# Patient Record
Sex: Female | Born: 1970
Health system: Southern US, Community
[De-identification: ages and names within clinical notes are randomized; demographics above are authoritative.]

## PROBLEM LIST (undated history)

## (undated) DIAGNOSIS — K861 Other chronic pancreatitis: Secondary | ICD-10-CM

## (undated) DIAGNOSIS — F32A Depression, unspecified: Secondary | ICD-10-CM

## (undated) DIAGNOSIS — R109 Unspecified abdominal pain: Secondary | ICD-10-CM

## (undated) DIAGNOSIS — F319 Bipolar disorder, unspecified: Secondary | ICD-10-CM

## (undated) DIAGNOSIS — K449 Diaphragmatic hernia without obstruction or gangrene: Secondary | ICD-10-CM

## (undated) DIAGNOSIS — K209 Esophagitis, unspecified without bleeding: Secondary | ICD-10-CM

## (undated) DIAGNOSIS — J449 Chronic obstructive pulmonary disease, unspecified: Secondary | ICD-10-CM

## (undated) DIAGNOSIS — M542 Cervicalgia: Secondary | ICD-10-CM

## (undated) DIAGNOSIS — E1140 Type 2 diabetes mellitus with diabetic neuropathy, unspecified: Secondary | ICD-10-CM

## (undated) DIAGNOSIS — G8929 Other chronic pain: Secondary | ICD-10-CM

## (undated) DIAGNOSIS — E785 Hyperlipidemia, unspecified: Secondary | ICD-10-CM

## (undated) DIAGNOSIS — D649 Anemia, unspecified: Secondary | ICD-10-CM

## (undated) DIAGNOSIS — F329 Major depressive disorder, single episode, unspecified: Secondary | ICD-10-CM

## (undated) DIAGNOSIS — F419 Anxiety disorder, unspecified: Secondary | ICD-10-CM

## (undated) HISTORY — PX: ABDOMINAL HYSTERECTOMY: SHX81

## (undated) HISTORY — DX: Type 2 diabetes mellitus with diabetic neuropathy, unspecified: E11.40

## (undated) HISTORY — PX: TONSILLECTOMY: SUR1361

## (undated) HISTORY — PX: CELIAC PLEXUS BLOCK: SHX1320

## (undated) HISTORY — PX: CHOLECYSTECTOMY: SHX55

---

## 2004-09-01 ENCOUNTER — Ambulatory Visit (HOSPITAL_COMMUNITY): Admission: RE | Admit: 2004-09-01 | Discharge: 2004-09-01 | Payer: Self-pay | Admitting: Obstetrics and Gynecology

## 2004-09-01 ENCOUNTER — Encounter (INDEPENDENT_AMBULATORY_CARE_PROVIDER_SITE_OTHER): Payer: Self-pay | Admitting: *Deleted

## 2004-09-02 ENCOUNTER — Encounter (INDEPENDENT_AMBULATORY_CARE_PROVIDER_SITE_OTHER): Payer: Self-pay | Admitting: *Deleted

## 2004-12-20 ENCOUNTER — Emergency Department (HOSPITAL_COMMUNITY): Admission: EM | Admit: 2004-12-20 | Discharge: 2004-12-20 | Payer: Self-pay | Admitting: Emergency Medicine

## 2005-02-23 ENCOUNTER — Emergency Department (HOSPITAL_COMMUNITY): Admission: EM | Admit: 2005-02-23 | Discharge: 2005-02-23 | Payer: Self-pay | Admitting: *Deleted

## 2006-08-09 ENCOUNTER — Emergency Department (HOSPITAL_COMMUNITY): Admission: EM | Admit: 2006-08-09 | Discharge: 2006-08-09 | Payer: Self-pay | Admitting: Emergency Medicine

## 2006-08-11 ENCOUNTER — Emergency Department (HOSPITAL_COMMUNITY): Admission: EM | Admit: 2006-08-11 | Discharge: 2006-08-11 | Payer: Self-pay | Admitting: Emergency Medicine

## 2006-10-05 ENCOUNTER — Emergency Department (HOSPITAL_COMMUNITY): Admission: EM | Admit: 2006-10-05 | Discharge: 2006-10-05 | Payer: Self-pay | Admitting: Emergency Medicine

## 2006-10-18 ENCOUNTER — Ambulatory Visit (HOSPITAL_COMMUNITY): Admission: RE | Admit: 2006-10-18 | Discharge: 2006-10-18 | Payer: Self-pay | Admitting: Family Medicine

## 2007-04-19 ENCOUNTER — Emergency Department (HOSPITAL_COMMUNITY): Admission: EM | Admit: 2007-04-19 | Discharge: 2007-04-19 | Payer: Self-pay | Admitting: Emergency Medicine

## 2007-06-26 ENCOUNTER — Emergency Department (HOSPITAL_COMMUNITY): Admission: EM | Admit: 2007-06-26 | Discharge: 2007-06-26 | Payer: Self-pay | Admitting: Emergency Medicine

## 2008-02-16 ENCOUNTER — Ambulatory Visit (HOSPITAL_COMMUNITY): Admission: RE | Admit: 2008-02-16 | Discharge: 2008-02-16 | Payer: Self-pay | Admitting: Internal Medicine

## 2008-02-17 ENCOUNTER — Inpatient Hospital Stay (HOSPITAL_COMMUNITY): Admission: AD | Admit: 2008-02-17 | Discharge: 2008-02-21 | Payer: Self-pay

## 2008-02-18 ENCOUNTER — Ambulatory Visit: Payer: Self-pay | Admitting: Internal Medicine

## 2008-02-20 ENCOUNTER — Ambulatory Visit: Payer: Self-pay | Admitting: Internal Medicine

## 2008-02-20 ENCOUNTER — Encounter: Payer: Self-pay | Admitting: Internal Medicine

## 2008-02-20 HISTORY — PX: ESOPHAGOGASTRODUODENOSCOPY: SHX1529

## 2008-02-21 ENCOUNTER — Ambulatory Visit: Payer: Self-pay | Admitting: Gastroenterology

## 2008-03-12 ENCOUNTER — Ambulatory Visit: Payer: Self-pay | Admitting: Internal Medicine

## 2008-03-12 ENCOUNTER — Encounter: Payer: Self-pay | Admitting: Internal Medicine

## 2008-03-12 ENCOUNTER — Ambulatory Visit (HOSPITAL_COMMUNITY): Admission: RE | Admit: 2008-03-12 | Discharge: 2008-03-12 | Payer: Self-pay | Admitting: Internal Medicine

## 2008-03-29 ENCOUNTER — Ambulatory Visit: Payer: Self-pay | Admitting: Internal Medicine

## 2008-03-30 ENCOUNTER — Ambulatory Visit (HOSPITAL_COMMUNITY): Admission: RE | Admit: 2008-03-30 | Discharge: 2008-03-30 | Payer: Self-pay | Admitting: Gastroenterology

## 2008-04-03 ENCOUNTER — Ambulatory Visit (HOSPITAL_COMMUNITY): Admission: RE | Admit: 2008-04-03 | Discharge: 2008-04-03 | Payer: Self-pay | Admitting: Internal Medicine

## 2008-04-03 ENCOUNTER — Encounter: Payer: Self-pay | Admitting: Internal Medicine

## 2008-04-03 ENCOUNTER — Ambulatory Visit: Payer: Self-pay | Admitting: Internal Medicine

## 2008-04-03 HISTORY — PX: ESOPHAGOGASTRODUODENOSCOPY: SHX1529

## 2008-05-08 ENCOUNTER — Emergency Department (HOSPITAL_COMMUNITY): Admission: EM | Admit: 2008-05-08 | Discharge: 2008-05-08 | Payer: Self-pay | Admitting: Emergency Medicine

## 2008-05-14 ENCOUNTER — Ambulatory Visit: Payer: Self-pay | Admitting: Internal Medicine

## 2008-06-04 ENCOUNTER — Ambulatory Visit: Payer: Self-pay | Admitting: Internal Medicine

## 2008-06-04 ENCOUNTER — Emergency Department (HOSPITAL_COMMUNITY): Admission: EM | Admit: 2008-06-04 | Discharge: 2008-06-04 | Payer: Self-pay | Admitting: Emergency Medicine

## 2008-06-04 ENCOUNTER — Ambulatory Visit (HOSPITAL_COMMUNITY): Admission: RE | Admit: 2008-06-04 | Discharge: 2008-06-04 | Payer: Self-pay | Admitting: Internal Medicine

## 2008-06-05 HISTORY — PX: OTHER SURGICAL HISTORY: SHX169

## 2008-07-13 ENCOUNTER — Inpatient Hospital Stay (HOSPITAL_COMMUNITY): Admission: EM | Admit: 2008-07-13 | Discharge: 2008-07-18 | Payer: Self-pay | Admitting: Emergency Medicine

## 2008-08-15 ENCOUNTER — Inpatient Hospital Stay (HOSPITAL_COMMUNITY): Admission: EM | Admit: 2008-08-15 | Discharge: 2008-08-16 | Payer: Self-pay | Admitting: Emergency Medicine

## 2008-09-04 ENCOUNTER — Inpatient Hospital Stay (HOSPITAL_COMMUNITY): Admission: EM | Admit: 2008-09-04 | Discharge: 2008-09-07 | Payer: Self-pay | Admitting: Emergency Medicine

## 2008-11-28 ENCOUNTER — Encounter (INDEPENDENT_AMBULATORY_CARE_PROVIDER_SITE_OTHER): Payer: Self-pay | Admitting: *Deleted

## 2008-12-20 ENCOUNTER — Ambulatory Visit (HOSPITAL_COMMUNITY): Admission: RE | Admit: 2008-12-20 | Discharge: 2008-12-20 | Payer: Self-pay | Admitting: Internal Medicine

## 2009-01-04 DIAGNOSIS — K219 Gastro-esophageal reflux disease without esophagitis: Secondary | ICD-10-CM

## 2009-01-04 DIAGNOSIS — D509 Iron deficiency anemia, unspecified: Secondary | ICD-10-CM

## 2009-01-04 DIAGNOSIS — F411 Generalized anxiety disorder: Secondary | ICD-10-CM | POA: Insufficient documentation

## 2009-01-04 DIAGNOSIS — R197 Diarrhea, unspecified: Secondary | ICD-10-CM | POA: Insufficient documentation

## 2009-01-04 DIAGNOSIS — K222 Esophageal obstruction: Secondary | ICD-10-CM

## 2009-01-04 DIAGNOSIS — G43909 Migraine, unspecified, not intractable, without status migrainosus: Secondary | ICD-10-CM | POA: Insufficient documentation

## 2009-01-04 DIAGNOSIS — K921 Melena: Secondary | ICD-10-CM | POA: Insufficient documentation

## 2009-01-04 DIAGNOSIS — K449 Diaphragmatic hernia without obstruction or gangrene: Secondary | ICD-10-CM | POA: Insufficient documentation

## 2009-01-04 DIAGNOSIS — F319 Bipolar disorder, unspecified: Secondary | ICD-10-CM

## 2009-01-04 DIAGNOSIS — R112 Nausea with vomiting, unspecified: Secondary | ICD-10-CM

## 2009-01-04 DIAGNOSIS — R1084 Generalized abdominal pain: Secondary | ICD-10-CM | POA: Insufficient documentation

## 2009-01-04 DIAGNOSIS — I10 Essential (primary) hypertension: Secondary | ICD-10-CM

## 2009-01-04 DIAGNOSIS — K859 Acute pancreatitis without necrosis or infection, unspecified: Secondary | ICD-10-CM

## 2009-01-04 DIAGNOSIS — R109 Unspecified abdominal pain: Secondary | ICD-10-CM

## 2009-01-04 DIAGNOSIS — J45909 Unspecified asthma, uncomplicated: Secondary | ICD-10-CM | POA: Insufficient documentation

## 2009-01-08 DIAGNOSIS — S52133A Displaced fracture of neck of unspecified radius, initial encounter for closed fracture: Secondary | ICD-10-CM | POA: Insufficient documentation

## 2009-03-14 ENCOUNTER — Emergency Department (HOSPITAL_COMMUNITY): Admission: EM | Admit: 2009-03-14 | Discharge: 2009-03-14 | Payer: Self-pay | Admitting: Emergency Medicine

## 2009-04-25 ENCOUNTER — Telehealth (INDEPENDENT_AMBULATORY_CARE_PROVIDER_SITE_OTHER): Payer: Self-pay

## 2009-06-09 ENCOUNTER — Inpatient Hospital Stay (HOSPITAL_COMMUNITY): Admission: EM | Admit: 2009-06-09 | Discharge: 2009-06-14 | Payer: Self-pay | Admitting: Emergency Medicine

## 2009-08-11 DIAGNOSIS — J4540 Moderate persistent asthma, uncomplicated: Secondary | ICD-10-CM | POA: Insufficient documentation

## 2009-10-20 ENCOUNTER — Inpatient Hospital Stay (HOSPITAL_COMMUNITY): Admission: EM | Admit: 2009-10-20 | Discharge: 2009-10-26 | Payer: Self-pay | Admitting: Emergency Medicine

## 2009-11-23 ENCOUNTER — Emergency Department (HOSPITAL_COMMUNITY): Admission: EM | Admit: 2009-11-23 | Discharge: 2009-11-23 | Payer: Self-pay | Admitting: Emergency Medicine

## 2009-11-23 ENCOUNTER — Emergency Department (HOSPITAL_COMMUNITY): Admission: EM | Admit: 2009-11-23 | Discharge: 2009-11-24 | Payer: Self-pay | Admitting: Emergency Medicine

## 2009-12-10 ENCOUNTER — Emergency Department (HOSPITAL_COMMUNITY): Admission: EM | Admit: 2009-12-10 | Discharge: 2009-12-10 | Payer: Self-pay | Admitting: Emergency Medicine

## 2009-12-25 ENCOUNTER — Emergency Department (HOSPITAL_COMMUNITY): Admission: EM | Admit: 2009-12-25 | Discharge: 2009-12-26 | Payer: Self-pay | Admitting: Emergency Medicine

## 2009-12-26 ENCOUNTER — Inpatient Hospital Stay (HOSPITAL_COMMUNITY): Admission: AD | Admit: 2009-12-26 | Discharge: 2009-12-30 | Payer: Self-pay | Admitting: Psychiatry

## 2009-12-26 ENCOUNTER — Ambulatory Visit: Payer: Self-pay | Admitting: Psychiatry

## 2010-01-15 ENCOUNTER — Inpatient Hospital Stay (HOSPITAL_COMMUNITY): Admission: EM | Admit: 2010-01-15 | Discharge: 2010-01-22 | Payer: Self-pay | Admitting: Emergency Medicine

## 2010-01-26 ENCOUNTER — Emergency Department (HOSPITAL_COMMUNITY): Admission: EM | Admit: 2010-01-26 | Discharge: 2010-01-26 | Payer: Self-pay | Admitting: Emergency Medicine

## 2010-02-05 ENCOUNTER — Inpatient Hospital Stay (HOSPITAL_COMMUNITY): Admission: EM | Admit: 2010-02-05 | Discharge: 2010-02-10 | Payer: Self-pay | Admitting: Emergency Medicine

## 2010-02-16 DIAGNOSIS — R300 Dysuria: Secondary | ICD-10-CM | POA: Insufficient documentation

## 2010-02-19 DIAGNOSIS — A493 Mycoplasma infection, unspecified site: Secondary | ICD-10-CM | POA: Insufficient documentation

## 2010-03-22 ENCOUNTER — Emergency Department (HOSPITAL_COMMUNITY): Admission: EM | Admit: 2010-03-22 | Discharge: 2010-03-22 | Payer: Self-pay | Admitting: Emergency Medicine

## 2010-04-04 ENCOUNTER — Emergency Department (HOSPITAL_COMMUNITY): Admission: EM | Admit: 2010-04-04 | Discharge: 2010-04-04 | Payer: Self-pay | Admitting: Emergency Medicine

## 2010-04-06 ENCOUNTER — Emergency Department (HOSPITAL_COMMUNITY): Admission: EM | Admit: 2010-04-06 | Discharge: 2010-04-06 | Payer: Self-pay | Admitting: Emergency Medicine

## 2010-04-17 ENCOUNTER — Ambulatory Visit: Payer: Self-pay | Admitting: Psychiatry

## 2010-04-17 ENCOUNTER — Emergency Department (HOSPITAL_COMMUNITY): Admission: EM | Admit: 2010-04-17 | Discharge: 2010-04-17 | Payer: Self-pay | Admitting: Emergency Medicine

## 2010-04-18 ENCOUNTER — Inpatient Hospital Stay (HOSPITAL_COMMUNITY): Admission: AD | Admit: 2010-04-18 | Discharge: 2010-04-21 | Payer: Self-pay | Admitting: Psychiatry

## 2010-05-08 ENCOUNTER — Inpatient Hospital Stay (HOSPITAL_COMMUNITY): Admission: EM | Admit: 2010-05-08 | Discharge: 2010-05-11 | Payer: Self-pay | Admitting: Emergency Medicine

## 2010-06-17 ENCOUNTER — Emergency Department (HOSPITAL_COMMUNITY): Admission: EM | Admit: 2010-06-17 | Discharge: 2010-06-18 | Payer: Self-pay | Admitting: Emergency Medicine

## 2010-10-07 LAB — COMPREHENSIVE METABOLIC PANEL
ALT: 46 U/L — ABNORMAL HIGH (ref 0–35)
AST: 26 U/L (ref 0–37)
BUN: 13 mg/dL (ref 6–23)
CO2: 28 mEq/L (ref 19–32)
Calcium: 9.2 mg/dL (ref 8.4–10.5)
Chloride: 101 mEq/L (ref 96–112)
Creatinine, Ser: 0.81 mg/dL (ref 0.4–1.2)
GFR calc Af Amer: >60 mL/min (ref >60)
GFR calc non Af Amer: >60 mL/min (ref >60)
Glucose, Bld: 141 mg/dL — ABNORMAL HIGH (ref 70–99)
Potassium: 3.8 mEq/L (ref 3.5–5.1)
Sodium: 139 mEq/L (ref 135–145)
Total Bilirubin: 0.5 mg/dL (ref 0.3–1.2)
Total Protein: 6.3 g/dL (ref 6.0–8.3)

## 2010-10-07 LAB — DIFFERENTIAL
Basophils Absolute: 0 10*3/uL (ref 0.0–0.1)
Basophils Relative: 0 % (ref 0–1)
Eosinophils Relative: 2 % (ref 0–5)
Lymphocytes Relative: 24 % (ref 12–46)
Lymphs Abs: 5 10*3/uL — ABNORMAL HIGH (ref 0.7–4.0)
Monocytes Relative: 5 % (ref 3–12)
Neutro Abs: 14.1 10*3/uL — ABNORMAL HIGH (ref 1.7–7.7)
Neutrophils Relative %: 68 % (ref 43–77)

## 2010-10-07 LAB — URINALYSIS, ROUTINE W REFLEX MICROSCOPIC
Bilirubin Urine: NEGATIVE
Glucose, UA: 100 mg/dL — AB
Ketones, ur: NEGATIVE mg/dL
Nitrite: NEGATIVE
Protein, ur: NEGATIVE mg/dL

## 2010-10-07 LAB — CBC
HCT: 38.4 % (ref 36.0–46.0)
MCH: 31.9 pg (ref 26.0–34.0)
MCHC: 35.2 g/dL (ref 30.0–36.0)
RBC: 4.23 MIL/uL (ref 3.87–5.11)
RDW: 13 % (ref 11.5–15.5)

## 2010-10-07 LAB — LIPASE, BLOOD: Lipase: 49 U/L (ref 11–59)

## 2010-10-08 LAB — CBC
HCT: 37.8 % (ref 36.0–46.0)
Hemoglobin: 13 g/dL (ref 12.0–15.0)
Hemoglobin: 13.1 g/dL (ref 12.0–15.0)
MCH: 31.4 pg (ref 26.0–34.0)
MCH: 31.5 pg (ref 26.0–34.0)
MCHC: 34.5 g/dL (ref 30.0–36.0)
MCV: 91.3 fL (ref 78.0–100.0)
MCV: 91.4 fL (ref 78.0–100.0)
Platelets: 198 10*3/uL (ref 150–400)
Platelets: 199 10*3/uL (ref 150–400)
RBC: 4.14 MIL/uL (ref 3.87–5.11)
RBC: 4.18 MIL/uL (ref 3.87–5.11)
RDW: 12.8 % (ref 11.5–15.5)
WBC: 10.1 10*3/uL (ref 4.0–10.5)
WBC: 9.3 10*3/uL (ref 4.0–10.5)

## 2010-10-08 LAB — BASIC METABOLIC PANEL
BUN: 4 mg/dL — ABNORMAL LOW (ref 6–23)
CO2: 27 mEq/L (ref 19–32)
CO2: 30 mEq/L (ref 19–32)
Calcium: 8.9 mg/dL (ref 8.4–10.5)
Chloride: 102 mEq/L (ref 96–112)
Chloride: 102 mEq/L (ref 96–112)
Creatinine, Ser: 0.61 mg/dL (ref 0.4–1.2)
Creatinine, Ser: 0.71 mg/dL (ref 0.4–1.2)
GFR calc Af Amer: >60 mL/min (ref >60)
GFR calc Af Amer: >60 mL/min (ref >60)
GFR calc non Af Amer: >60 mL/min (ref >60)
GFR calc non Af Amer: >60 mL/min (ref >60)
Glucose, Bld: 109 mg/dL — ABNORMAL HIGH (ref 70–99)
Potassium: 4.6 mEq/L (ref 3.5–5.1)
Sodium: 135 mEq/L (ref 135–145)
Sodium: 139 mEq/L (ref 135–145)

## 2010-10-08 LAB — DIFFERENTIAL
Basophils Absolute: 0 10*3/uL (ref 0.0–0.1)
Basophils Relative: 0 % (ref 0–1)
Eosinophils Absolute: 0.3 10*3/uL (ref 0.0–0.7)
Eosinophils Absolute: 0.4 10*3/uL (ref 0.0–0.7)
Eosinophils Relative: 3 % (ref 0–5)
Eosinophils Relative: 4 % (ref 0–5)
Lymphocytes Relative: 21 % (ref 12–46)
Lymphocytes Relative: 22 % (ref 12–46)
Lymphs Abs: 2 10*3/uL (ref 0.7–4.0)
Lymphs Abs: 2.1 10*3/uL (ref 0.7–4.0)
Monocytes Absolute: 0.6 10*3/uL (ref 0.1–1.0)
Monocytes Relative: 6 % (ref 3–12)
Monocytes Relative: 6 % (ref 3–12)
Neutro Abs: 6.3 10*3/uL (ref 1.7–7.7)
Neutro Abs: 7 10*3/uL (ref 1.7–7.7)
Neutrophils Relative %: 68 % (ref 43–77)
Neutrophils Relative %: 69 % (ref 43–77)

## 2010-10-08 LAB — LIPASE, BLOOD
Lipase: 112 U/L — ABNORMAL HIGH (ref 11–59)
Lipase: 67 U/L — ABNORMAL HIGH (ref 11–59)

## 2010-10-09 LAB — URINALYSIS, ROUTINE W REFLEX MICROSCOPIC
Bilirubin Urine: NEGATIVE
Hgb urine dipstick: NEGATIVE
Hgb urine dipstick: NEGATIVE
Nitrite: POSITIVE — AB
Protein, ur: NEGATIVE mg/dL
Specific Gravity, Urine: 1.025 (ref 1.005–1.030)
Urobilinogen, UA: 0.2 mg/dL (ref 0.0–1.0)
Urobilinogen, UA: 1 mg/dL (ref 0.0–1.0)
pH: 5.5 (ref 5.0–8.0)

## 2010-10-09 LAB — CBC
HCT: 39.9 % (ref 36.0–46.0)
HCT: 45 % (ref 36.0–46.0)
Hemoglobin: 13.3 g/dL (ref 12.0–15.0)
Hemoglobin: 14 g/dL (ref 12.0–15.0)
Hemoglobin: 15.2 g/dL — ABNORMAL HIGH (ref 12.0–15.0)
Hemoglobin: 15.3 g/dL — ABNORMAL HIGH (ref 12.0–15.0)
MCH: 30.9 pg (ref 26.0–34.0)
MCH: 31 pg (ref 26.0–34.0)
MCH: 31 pg (ref 26.0–34.0)
MCH: 31.4 pg (ref 26.0–34.0)
MCHC: 33.9 g/dL (ref 30.0–36.0)
MCHC: 34.1 g/dL (ref 30.0–36.0)
MCV: 91.1 fL (ref 78.0–100.0)
MCV: 91.2 fL (ref 78.0–100.0)
MCV: 91.3 fL (ref 78.0–100.0)
Platelets: 212 10*3/uL (ref 150–400)
Platelets: 227 10*3/uL (ref 150–400)
Platelets: 246 10*3/uL (ref 150–400)
RBC: 4.92 MIL/uL (ref 3.87–5.11)
RBC: 4.23 MIL/uL (ref 3.87–5.11)
RDW: 12.2 % (ref 11.5–15.5)
RDW: 12.5 % (ref 11.5–15.5)
WBC: 11.6 10*3/uL — ABNORMAL HIGH (ref 4.0–10.5)
WBC: 11.7 10*3/uL — ABNORMAL HIGH (ref 4.0–10.5)
WBC: 15.4 10*3/uL — ABNORMAL HIGH (ref 4.0–10.5)

## 2010-10-09 LAB — TSH: TSH: 3.821 u[IU]/mL (ref 0.350–4.500)

## 2010-10-09 LAB — COMPREHENSIVE METABOLIC PANEL
ALT: 20 U/L (ref 0–35)
ALT: 21 U/L (ref 0–35)
ALT: 25 U/L (ref 0–35)
ALT: 25 U/L (ref 0–35)
AST: 16 U/L (ref 0–37)
AST: 24 U/L (ref 0–37)
Albumin: 3.6 g/dL (ref 3.5–5.2)
Albumin: 3.8 g/dL (ref 3.5–5.2)
Albumin: 4.3 g/dL (ref 3.5–5.2)
Alkaline Phosphatase: 76 U/L (ref 39–117)
BUN: 10 mg/dL (ref 6–23)
BUN: 9 mg/dL (ref 6–23)
CO2: 24 mEq/L (ref 19–32)
CO2: 28 mEq/L (ref 19–32)
CO2: 30 mEq/L (ref 19–32)
Calcium: 8.7 mg/dL (ref 8.4–10.5)
Calcium: 9.5 mg/dL (ref 8.4–10.5)
Chloride: 103 mEq/L (ref 96–112)
Chloride: 107 mEq/L (ref 96–112)
Chloride: 108 mEq/L (ref 96–112)
Creatinine, Ser: 0.75 mg/dL (ref 0.4–1.2)
Creatinine, Ser: 0.75 mg/dL (ref 0.4–1.2)
GFR calc Af Amer: >60 mL/min (ref >60)
GFR calc Af Amer: >60 mL/min (ref >60)
GFR calc non Af Amer: >60 mL/min (ref >60)
GFR calc non Af Amer: >60 mL/min (ref >60)
GFR calc non Af Amer: >60 mL/min (ref >60)
Glucose, Bld: 114 mg/dL — ABNORMAL HIGH (ref 70–99)
Glucose, Bld: 140 mg/dL — ABNORMAL HIGH (ref 70–99)
Glucose, Bld: 144 mg/dL — ABNORMAL HIGH (ref 70–99)
Glucose, Bld: 155 mg/dL — ABNORMAL HIGH (ref 70–99)
Potassium: 3.9 mEq/L (ref 3.5–5.1)
Sodium: 136 mEq/L (ref 135–145)
Sodium: 136 mEq/L (ref 135–145)
Sodium: 139 mEq/L (ref 135–145)
Sodium: 140 mEq/L (ref 135–145)
Total Bilirubin: 0.4 mg/dL (ref 0.3–1.2)
Total Bilirubin: 0.7 mg/dL (ref 0.3–1.2)
Total Bilirubin: 0.7 mg/dL (ref 0.3–1.2)
Total Protein: 6.4 g/dL (ref 6.0–8.3)
Total Protein: 7 g/dL (ref 6.0–8.3)

## 2010-10-09 LAB — DIFFERENTIAL
Basophils Absolute: 0 10*3/uL (ref 0.0–0.1)
Basophils Absolute: 0 10*3/uL (ref 0.0–0.1)
Basophils Absolute: 0 10*3/uL (ref 0.0–0.1)
Basophils Relative: 0 % (ref 0–1)
Basophils Relative: 0 % (ref 0–1)
Basophils Relative: 0 % (ref 0–1)
Eosinophils Absolute: 0.2 10*3/uL (ref 0.0–0.7)
Eosinophils Absolute: 0.3 10*3/uL (ref 0.0–0.7)
Eosinophils Absolute: 0.7 10*3/uL (ref 0.0–0.7)
Eosinophils Relative: 3 % (ref 0–5)
Eosinophils Relative: 6 % — ABNORMAL HIGH (ref 0–5)
Lymphocytes Relative: 17 % (ref 12–46)
Lymphocytes Relative: 18 % (ref 12–46)
Lymphocytes Relative: 21 % (ref 12–46)
Lymphs Abs: 2.2 10*3/uL (ref 0.7–4.0)
Lymphs Abs: 2.6 10*3/uL (ref 0.7–4.0)
Monocytes Absolute: 0.5 10*3/uL (ref 0.1–1.0)
Monocytes Absolute: 0.6 10*3/uL (ref 0.1–1.0)
Monocytes Absolute: 0.7 10*3/uL (ref 0.1–1.0)
Monocytes Absolute: 0.7 10*3/uL (ref 0.1–1.0)
Monocytes Relative: 4 % (ref 3–12)
Monocytes Relative: 5 % (ref 3–12)
Neutro Abs: 11.7 10*3/uL — ABNORMAL HIGH (ref 1.7–7.7)
Neutro Abs: 7.5 10*3/uL (ref 1.7–7.7)
Neutro Abs: 7.9 10*3/uL — ABNORMAL HIGH (ref 1.7–7.7)
Neutro Abs: 8.5 10*3/uL — ABNORMAL HIGH (ref 1.7–7.7)
Neutro Abs: 9.9 10*3/uL — ABNORMAL HIGH (ref 1.7–7.7)
Neutrophils Relative %: 71 % (ref 43–77)
Neutrophils Relative %: 72 % (ref 43–77)
Neutrophils Relative %: 76 % (ref 43–77)
Neutrophils Relative %: 78 % — ABNORMAL HIGH (ref 43–77)

## 2010-10-09 LAB — RAPID URINE DRUG SCREEN, HOSP PERFORMED
Amphetamines: NOT DETECTED
Barbiturates: NOT DETECTED
Tetrahydrocannabinol: NOT DETECTED

## 2010-10-09 LAB — BASIC METABOLIC PANEL
CO2: 26 mEq/L (ref 19–32)
Calcium: 9.4 mg/dL (ref 8.4–10.5)
Chloride: 101 mEq/L (ref 96–112)
GFR calc Af Amer: >60 mL/min (ref >60)
GFR calc non Af Amer: >60 mL/min (ref >60)
Sodium: 138 mEq/L (ref 135–145)

## 2010-10-09 LAB — MRSA PCR SCREENING: MRSA by PCR: NEGATIVE

## 2010-10-09 LAB — T4, FREE: Free T4: 0.98 ng/dL (ref 0.80–1.80)

## 2010-10-09 LAB — LIPASE, BLOOD
Lipase: 282 U/L — ABNORMAL HIGH (ref 11–59)
Lipase: 291 U/L — ABNORMAL HIGH (ref 11–59)
Lipase: 307 U/L — ABNORMAL HIGH (ref 11–59)
Lipase: 49 U/L (ref 11–59)

## 2010-10-09 LAB — URINE MICROSCOPIC-ADD ON

## 2010-10-09 LAB — T3, FREE: T3, Free: 2.9 pg/mL (ref 2.3–4.2)

## 2010-10-10 LAB — BASIC METABOLIC PANEL
BUN: 14 mg/dL (ref 6–23)
CO2: 23 mEq/L (ref 19–32)
Calcium: 9.1 mg/dL (ref 8.4–10.5)
GFR calc Af Amer: >60 mL/min (ref >60)
GFR calc non Af Amer: >60 mL/min (ref >60)
Glucose, Bld: 109 mg/dL — ABNORMAL HIGH (ref 70–99)
Sodium: 137 mEq/L (ref 135–145)

## 2010-10-10 LAB — DIFFERENTIAL
Basophils Absolute: 0 10*3/uL (ref 0.0–0.1)
Basophils Relative: 0 % (ref 0–1)
Eosinophils Absolute: 0.3 10*3/uL (ref 0.0–0.7)
Eosinophils Relative: 3 % (ref 0–5)
Monocytes Absolute: 0.1 10*3/uL (ref 0.1–1.0)
Monocytes Relative: 1 % — ABNORMAL LOW (ref 3–12)
Neutrophils Relative %: 73 % (ref 43–77)

## 2010-10-10 LAB — URINALYSIS, ROUTINE W REFLEX MICROSCOPIC
Bilirubin Urine: NEGATIVE
Glucose, UA: NEGATIVE mg/dL
Hgb urine dipstick: NEGATIVE
Ketones, ur: NEGATIVE mg/dL
Protein, ur: NEGATIVE mg/dL
Specific Gravity, Urine: 1.02 (ref 1.005–1.030)
Urobilinogen, UA: 0.2 mg/dL (ref 0.0–1.0)

## 2010-10-10 LAB — CBC
HCT: 44.6 % (ref 36.0–46.0)
Hemoglobin: 14.6 g/dL (ref 12.0–15.0)
MCH: 30.1 pg (ref 26.0–34.0)
MCHC: 32.6 g/dL (ref 30.0–36.0)
RDW: 12.6 % (ref 11.5–15.5)

## 2010-10-10 LAB — LIPASE, BLOOD: Lipase: 29 U/L (ref 11–59)

## 2010-10-11 LAB — BASIC METABOLIC PANEL
BUN: 5 mg/dL — ABNORMAL LOW (ref 6–23)
CO2: 28 mEq/L (ref 19–32)
Calcium: 8.8 mg/dL (ref 8.4–10.5)
Chloride: 100 mEq/L (ref 96–112)
Chloride: 105 mEq/L (ref 96–112)
Creatinine, Ser: 0.58 mg/dL (ref 0.4–1.2)
Creatinine, Ser: 0.6 mg/dL (ref 0.4–1.2)
GFR calc Af Amer: >60 mL/min (ref >60)
GFR calc Af Amer: >60 mL/min (ref >60)
GFR calc non Af Amer: >60 mL/min (ref >60)
Glucose, Bld: 105 mg/dL — ABNORMAL HIGH (ref 70–99)
Glucose, Bld: 130 mg/dL — ABNORMAL HIGH (ref 70–99)
Potassium: 3.6 mEq/L (ref 3.5–5.1)

## 2010-10-11 LAB — GLUCOSE, CAPILLARY
Glucose-Capillary: 104 mg/dL — ABNORMAL HIGH (ref 70–99)
Glucose-Capillary: 106 mg/dL — ABNORMAL HIGH (ref 70–99)
Glucose-Capillary: 115 mg/dL — ABNORMAL HIGH (ref 70–99)
Glucose-Capillary: 119 mg/dL — ABNORMAL HIGH (ref 70–99)
Glucose-Capillary: 126 mg/dL — ABNORMAL HIGH (ref 70–99)
Glucose-Capillary: 145 mg/dL — ABNORMAL HIGH (ref 70–99)
Glucose-Capillary: 157 mg/dL — ABNORMAL HIGH (ref 70–99)
Glucose-Capillary: 163 mg/dL — ABNORMAL HIGH (ref 70–99)
Glucose-Capillary: 168 mg/dL — ABNORMAL HIGH (ref 70–99)
Glucose-Capillary: 170 mg/dL — ABNORMAL HIGH (ref 70–99)
Glucose-Capillary: 195 mg/dL — ABNORMAL HIGH (ref 70–99)

## 2010-10-11 LAB — CBC
Hemoglobin: 12.6 g/dL (ref 12.0–15.0)
MCH: 31.1 pg (ref 26.0–34.0)
MCHC: 33.9 g/dL (ref 30.0–36.0)
MCV: 91.7 fL (ref 78.0–100.0)
MCV: 91.8 fL (ref 78.0–100.0)
Platelets: 214 10*3/uL (ref 150–400)
Platelets: 243 10*3/uL (ref 150–400)
RBC: 3.98 MIL/uL (ref 3.87–5.11)
RBC: 4.06 MIL/uL (ref 3.87–5.11)
RDW: 12.3 % (ref 11.5–15.5)
WBC: 12.3 10*3/uL — ABNORMAL HIGH (ref 4.0–10.5)

## 2010-10-11 LAB — DIFFERENTIAL
Basophils Absolute: 0 10*3/uL (ref 0.0–0.1)
Eosinophils Absolute: 0.8 10*3/uL — ABNORMAL HIGH (ref 0.0–0.7)
Eosinophils Absolute: 1.3 10*3/uL — ABNORMAL HIGH (ref 0.0–0.7)
Eosinophils Relative: 11 % — ABNORMAL HIGH (ref 0–5)
Eosinophils Relative: 6 % — ABNORMAL HIGH (ref 0–5)
Lymphocytes Relative: 11 % — ABNORMAL LOW (ref 12–46)
Lymphs Abs: 1.3 10*3/uL (ref 0.7–4.0)
Lymphs Abs: 1.4 10*3/uL (ref 0.7–4.0)
Monocytes Relative: 7 % (ref 3–12)
Neutro Abs: 8.9 10*3/uL — ABNORMAL HIGH (ref 1.7–7.7)
Neutrophils Relative %: 72 % (ref 43–77)

## 2010-10-11 LAB — URINALYSIS, ROUTINE W REFLEX MICROSCOPIC
Bilirubin Urine: NEGATIVE
Glucose, UA: NEGATIVE mg/dL
Nitrite: NEGATIVE
Specific Gravity, Urine: 1.01 (ref 1.005–1.030)
Urobilinogen, UA: 0.2 mg/dL (ref 0.0–1.0)

## 2010-10-11 LAB — VITAMIN B12 BINDING CAPACITY, BLOOD: Vitamin B12 Bind Capacity: 1499 pg/mL — ABNORMAL HIGH (ref 650–1340)

## 2010-10-11 LAB — URINE CULTURE: Colony Count: 65000

## 2010-10-11 LAB — URINE MICROSCOPIC-ADD ON

## 2010-10-11 LAB — VITAMIN D 1,25 DIHYDROXY
Vitamin D 1, 25 (OH)2 Total: 72 pg/mL (ref 18–72)
Vitamin D2 1, 25 (OH)2: <8 pg/mL
Vitamin D3 1, 25 (OH)2: 72 pg/mL

## 2010-10-11 LAB — LIPASE, BLOOD: Lipase: 41 U/L (ref 11–59)

## 2010-10-12 DIAGNOSIS — H7290 Unspecified perforation of tympanic membrane, unspecified ear: Secondary | ICD-10-CM | POA: Insufficient documentation

## 2010-10-12 DIAGNOSIS — M546 Pain in thoracic spine: Secondary | ICD-10-CM | POA: Insufficient documentation

## 2010-10-12 DIAGNOSIS — F419 Anxiety disorder, unspecified: Secondary | ICD-10-CM | POA: Insufficient documentation

## 2010-10-12 LAB — BASIC METABOLIC PANEL
BUN: 1 mg/dL — ABNORMAL LOW (ref 6–23)
BUN: 4 mg/dL — ABNORMAL LOW (ref 6–23)
CO2: 27 mEq/L (ref 19–32)
CO2: 25 mEq/L (ref 19–32)
CO2: 30 mEq/L (ref 19–32)
Calcium: 8.6 mg/dL (ref 8.4–10.5)
Calcium: 8.2 mg/dL — ABNORMAL LOW (ref 8.4–10.5)
Calcium: 8.8 mg/dL (ref 8.4–10.5)
Calcium: 8.8 mg/dL (ref 8.4–10.5)
Chloride: 104 mEq/L (ref 96–112)
Chloride: 102 mEq/L (ref 96–112)
Chloride: 107 mEq/L (ref 96–112)
Creatinine, Ser: 0.68 mg/dL (ref 0.4–1.2)
Creatinine, Ser: 0.56 mg/dL (ref 0.4–1.2)
Creatinine, Ser: 0.64 mg/dL (ref 0.4–1.2)
Creatinine, Ser: 0.68 mg/dL (ref 0.4–1.2)
GFR calc Af Amer: >60 mL/min (ref >60)
GFR calc Af Amer: >60 mL/min (ref >60)
GFR calc Af Amer: >60 mL/min (ref >60)
GFR calc non Af Amer: >60 mL/min (ref >60)
GFR calc non Af Amer: >60 mL/min (ref >60)
GFR calc non Af Amer: >60 mL/min (ref >60)
GFR calc non Af Amer: >60 mL/min (ref >60)
Glucose, Bld: 122 mg/dL — ABNORMAL HIGH (ref 70–99)
Glucose, Bld: 82 mg/dL (ref 70–99)
Potassium: 3.8 mEq/L (ref 3.5–5.1)
Sodium: 137 mEq/L (ref 135–145)
Sodium: 138 mEq/L (ref 135–145)
Sodium: 141 mEq/L (ref 135–145)

## 2010-10-12 LAB — COMPREHENSIVE METABOLIC PANEL
ALT: 89 U/L — ABNORMAL HIGH (ref 0–35)
ALT: 33 U/L (ref 0–35)
AST: 54 U/L — ABNORMAL HIGH (ref 0–37)
AST: 22 U/L (ref 0–37)
AST: 31 U/L (ref 0–37)
Albumin: 3.9 g/dL (ref 3.5–5.2)
Albumin: 3.4 g/dL — ABNORMAL LOW (ref 3.5–5.2)
Albumin: 3.7 g/dL (ref 3.5–5.2)
Alkaline Phosphatase: 77 U/L (ref 39–117)
Alkaline Phosphatase: 82 U/L (ref 39–117)
Alkaline Phosphatase: 84 U/L (ref 39–117)
BUN: 7 mg/dL (ref 6–23)
BUN: 8 mg/dL (ref 6–23)
CO2: 25 mEq/L (ref 19–32)
CO2: 26 mEq/L (ref 19–32)
CO2: 29 mEq/L (ref 19–32)
Calcium: 9.4 mg/dL (ref 8.4–10.5)
Calcium: 8.3 mg/dL — ABNORMAL LOW (ref 8.4–10.5)
Chloride: 104 mEq/L (ref 96–112)
Chloride: 105 mEq/L (ref 96–112)
Chloride: 106 mEq/L (ref 96–112)
Chloride: 107 mEq/L (ref 96–112)
Creatinine, Ser: 0.65 mg/dL (ref 0.4–1.2)
Creatinine, Ser: 0.58 mg/dL (ref 0.4–1.2)
Creatinine, Ser: 0.74 mg/dL (ref 0.4–1.2)
GFR calc Af Amer: >60 mL/min (ref >60)
GFR calc Af Amer: >60 mL/min (ref >60)
GFR calc Af Amer: >60 mL/min (ref >60)
GFR calc non Af Amer: >60 mL/min (ref >60)
GFR calc non Af Amer: >60 mL/min (ref >60)
GFR calc non Af Amer: >60 mL/min (ref >60)
GFR calc non Af Amer: >60 mL/min (ref >60)
Glucose, Bld: 74 mg/dL (ref 70–99)
Potassium: 4.1 mEq/L (ref 3.5–5.1)
Potassium: 4.5 mEq/L (ref 3.5–5.1)
Sodium: 136 mEq/L (ref 135–145)
Sodium: 136 mEq/L (ref 135–145)
Sodium: 139 mEq/L (ref 135–145)
Total Bilirubin: 0.8 mg/dL (ref 0.3–1.2)
Total Bilirubin: 0.7 mg/dL (ref 0.3–1.2)
Total Bilirubin: 0.7 mg/dL (ref 0.3–1.2)
Total Bilirubin: 0.9 mg/dL (ref 0.3–1.2)
Total Protein: 6.1 g/dL (ref 6.0–8.3)

## 2010-10-12 LAB — DIFFERENTIAL
Basophils Absolute: 0 10*3/uL (ref 0.0–0.1)
Basophils Absolute: 0 10*3/uL (ref 0.0–0.1)
Basophils Absolute: 0 10*3/uL (ref 0.0–0.1)
Basophils Absolute: 0 10*3/uL (ref 0.0–0.1)
Basophils Absolute: 0 10*3/uL (ref 0.0–0.1)
Basophils Absolute: 0 10*3/uL (ref 0.0–0.1)
Basophils Absolute: 0.1 10*3/uL (ref 0.0–0.1)
Basophils Relative: 0 % (ref 0–1)
Basophils Relative: 0 % (ref 0–1)
Basophils Relative: 0 % (ref 0–1)
Basophils Relative: 0 % (ref 0–1)
Basophils Relative: 1 % (ref 0–1)
Eosinophils Absolute: 0.6 10*3/uL (ref 0.0–0.7)
Eosinophils Absolute: 0.7 10*3/uL (ref 0.0–0.7)
Eosinophils Absolute: 0.5 10*3/uL (ref 0.0–0.7)
Eosinophils Absolute: 0.5 10*3/uL (ref 0.0–0.7)
Eosinophils Absolute: 0.5 10*3/uL (ref 0.0–0.7)
Eosinophils Absolute: 0.6 10*3/uL (ref 0.0–0.7)
Eosinophils Absolute: 0.7 10*3/uL (ref 0.0–0.7)
Eosinophils Relative: 5 % (ref 0–5)
Eosinophils Relative: 4 % (ref 0–5)
Eosinophils Relative: 6 % — ABNORMAL HIGH (ref 0–5)
Eosinophils Relative: 7 % — ABNORMAL HIGH (ref 0–5)
Lymphocytes Relative: 10 % — ABNORMAL LOW (ref 12–46)
Lymphocytes Relative: 19 % (ref 12–46)
Lymphocytes Relative: 16 % (ref 12–46)
Lymphocytes Relative: 21 % (ref 12–46)
Lymphocytes Relative: 22 % (ref 12–46)
Lymphocytes Relative: 22 % (ref 12–46)
Lymphocytes Relative: 24 % (ref 12–46)
Lymphs Abs: 1.8 10*3/uL (ref 0.7–4.0)
Lymphs Abs: 2.8 10*3/uL (ref 0.7–4.0)
Lymphs Abs: 1.9 10*3/uL (ref 0.7–4.0)
Lymphs Abs: 1.9 10*3/uL (ref 0.7–4.0)
Lymphs Abs: 2.4 10*3/uL (ref 0.7–4.0)
Monocytes Absolute: 0.7 10*3/uL (ref 0.1–1.0)
Monocytes Absolute: 1.1 10*3/uL — ABNORMAL HIGH (ref 0.1–1.0)
Monocytes Absolute: 0.5 10*3/uL (ref 0.1–1.0)
Monocytes Absolute: 0.6 10*3/uL (ref 0.1–1.0)
Monocytes Absolute: 0.8 10*3/uL (ref 0.1–1.0)
Monocytes Absolute: 1.1 10*3/uL — ABNORMAL HIGH (ref 0.1–1.0)
Monocytes Relative: 5 % (ref 3–12)
Monocytes Relative: 6 % (ref 3–12)
Monocytes Relative: 6 % (ref 3–12)
Monocytes Relative: 7 % (ref 3–12)
Monocytes Relative: 7 % (ref 3–12)
Neutro Abs: 10.7 10*3/uL — ABNORMAL HIGH (ref 1.7–7.7)
Neutro Abs: 14.9 10*3/uL — ABNORMAL HIGH (ref 1.7–7.7)
Neutro Abs: 10.7 10*3/uL — ABNORMAL HIGH (ref 1.7–7.7)
Neutro Abs: 10.8 10*3/uL — ABNORMAL HIGH (ref 1.7–7.7)
Neutro Abs: 4.8 10*3/uL (ref 1.7–7.7)
Neutro Abs: 5.4 10*3/uL (ref 1.7–7.7)
Neutro Abs: 5.9 10*3/uL (ref 1.7–7.7)
Neutro Abs: 6.5 10*3/uL (ref 1.7–7.7)
Neutrophils Relative %: 81 % — ABNORMAL HIGH (ref 43–77)
Neutrophils Relative %: 62 % (ref 43–77)
Neutrophils Relative %: 64 % (ref 43–77)
Neutrophils Relative %: 66 % (ref 43–77)

## 2010-10-12 LAB — BLOOD GAS, ARTERIAL
Acid-base deficit: 2 mmol/L (ref 0.0–2.0)
O2 Content: 4 L/min
O2 Saturation: 93.2 %
Patient temperature: 37
TCO2: 21.7 mmol/L (ref 0–100)
pCO2 arterial: 52 mmHg — ABNORMAL HIGH (ref 35.0–45.0)

## 2010-10-12 LAB — GLUCOSE, CAPILLARY
Glucose-Capillary: 139 mg/dL — ABNORMAL HIGH (ref 70–99)
Glucose-Capillary: 146 mg/dL — ABNORMAL HIGH (ref 70–99)
Glucose-Capillary: 161 mg/dL — ABNORMAL HIGH (ref 70–99)
Glucose-Capillary: 101 mg/dL — ABNORMAL HIGH (ref 70–99)
Glucose-Capillary: 102 mg/dL — ABNORMAL HIGH (ref 70–99)
Glucose-Capillary: 105 mg/dL — ABNORMAL HIGH (ref 70–99)
Glucose-Capillary: 107 mg/dL — ABNORMAL HIGH (ref 70–99)
Glucose-Capillary: 108 mg/dL — ABNORMAL HIGH (ref 70–99)
Glucose-Capillary: 110 mg/dL — ABNORMAL HIGH (ref 70–99)
Glucose-Capillary: 111 mg/dL — ABNORMAL HIGH (ref 70–99)
Glucose-Capillary: 113 mg/dL — ABNORMAL HIGH (ref 70–99)
Glucose-Capillary: 116 mg/dL — ABNORMAL HIGH (ref 70–99)
Glucose-Capillary: 116 mg/dL — ABNORMAL HIGH (ref 70–99)
Glucose-Capillary: 118 mg/dL — ABNORMAL HIGH (ref 70–99)
Glucose-Capillary: 120 mg/dL — ABNORMAL HIGH (ref 70–99)
Glucose-Capillary: 126 mg/dL — ABNORMAL HIGH (ref 70–99)
Glucose-Capillary: 128 mg/dL — ABNORMAL HIGH (ref 70–99)
Glucose-Capillary: 128 mg/dL — ABNORMAL HIGH (ref 70–99)
Glucose-Capillary: 129 mg/dL — ABNORMAL HIGH (ref 70–99)
Glucose-Capillary: 136 mg/dL — ABNORMAL HIGH (ref 70–99)
Glucose-Capillary: 137 mg/dL — ABNORMAL HIGH (ref 70–99)
Glucose-Capillary: 157 mg/dL — ABNORMAL HIGH (ref 70–99)
Glucose-Capillary: 164 mg/dL — ABNORMAL HIGH (ref 70–99)
Glucose-Capillary: 227 mg/dL — ABNORMAL HIGH (ref 70–99)
Glucose-Capillary: 75 mg/dL (ref 70–99)
Glucose-Capillary: 77 mg/dL (ref 70–99)
Glucose-Capillary: 88 mg/dL (ref 70–99)
Glucose-Capillary: 92 mg/dL (ref 70–99)
Glucose-Capillary: 98 mg/dL (ref 70–99)

## 2010-10-12 LAB — URINALYSIS, ROUTINE W REFLEX MICROSCOPIC
Bilirubin Urine: NEGATIVE
Glucose, UA: 250 mg/dL — AB
Glucose, UA: NEGATIVE mg/dL
Glucose, UA: NEGATIVE mg/dL
Hgb urine dipstick: NEGATIVE
Leukocytes, UA: NEGATIVE
Nitrite: NEGATIVE
Nitrite: POSITIVE — AB
Protein, ur: 30 mg/dL — AB
Specific Gravity, Urine: 1.025 (ref 1.005–1.030)
Specific Gravity, Urine: >1.03 — ABNORMAL HIGH (ref 1.005–1.030)
Urobilinogen, UA: 0.2 mg/dL (ref 0.0–1.0)
pH: 5 (ref 5.0–8.0)
pH: 5 (ref 5.0–8.0)
pH: 5 (ref 5.0–8.0)

## 2010-10-12 LAB — CBC
HCT: 41.6 % (ref 36.0–46.0)
HCT: 35.2 % — ABNORMAL LOW (ref 36.0–46.0)
HCT: 36.4 % (ref 36.0–46.0)
HCT: 38.7 % (ref 36.0–46.0)
HCT: 39.8 % (ref 36.0–46.0)
Hemoglobin: 14 g/dL (ref 12.0–15.0)
Hemoglobin: 14.3 g/dL (ref 12.0–15.0)
Hemoglobin: 11.8 g/dL — ABNORMAL LOW (ref 12.0–15.0)
Hemoglobin: 12.1 g/dL (ref 12.0–15.0)
Hemoglobin: 13.3 g/dL (ref 12.0–15.0)
Hemoglobin: 14.5 g/dL (ref 12.0–15.0)
MCH: 30.9 pg (ref 26.0–34.0)
MCH: 30.7 pg (ref 26.0–34.0)
MCH: 30.8 pg (ref 26.0–34.0)
MCH: 31.3 pg (ref 26.0–34.0)
MCH: 31.3 pg (ref 26.0–34.0)
MCHC: 33.7 g/dL (ref 30.0–36.0)
MCHC: 34.2 g/dL (ref 30.0–36.0)
MCHC: 33.7 g/dL (ref 30.0–36.0)
MCHC: 34.2 g/dL (ref 30.0–36.0)
MCHC: 34.4 g/dL (ref 30.0–36.0)
MCHC: 34.6 g/dL (ref 30.0–36.0)
MCV: 91.5 fL (ref 78.0–100.0)
MCV: 91 fL (ref 78.0–100.0)
MCV: 91.4 fL (ref 78.0–100.0)
MCV: 91.8 fL (ref 78.0–100.0)
Platelets: 312 10*3/uL (ref 150–400)
Platelets: 315 10*3/uL (ref 150–400)
Platelets: 174 10*3/uL (ref 150–400)
Platelets: 218 10*3/uL (ref 150–400)
Platelets: 226 10*3/uL (ref 150–400)
Platelets: 229 10*3/uL (ref 150–400)
Platelets: 248 10*3/uL (ref 150–400)
RBC: 4.55 MIL/uL (ref 3.87–5.11)
RBC: 4.61 MIL/uL (ref 3.87–5.11)
RBC: 3.88 MIL/uL (ref 3.87–5.11)
RBC: 4.01 MIL/uL (ref 3.87–5.11)
RBC: 4.25 MIL/uL (ref 3.87–5.11)
RBC: 4.72 MIL/uL (ref 3.87–5.11)
RDW: 12.4 % (ref 11.5–15.5)
RDW: 12.5 % (ref 11.5–15.5)
RDW: 12.7 % (ref 11.5–15.5)
WBC: 15 10*3/uL — ABNORMAL HIGH (ref 4.0–10.5)
WBC: 18.5 10*3/uL — ABNORMAL HIGH (ref 4.0–10.5)
WBC: 14.5 10*3/uL — ABNORMAL HIGH (ref 4.0–10.5)
WBC: 14.8 10*3/uL — ABNORMAL HIGH (ref 4.0–10.5)
WBC: 7.8 10*3/uL (ref 4.0–10.5)
WBC: 8.5 10*3/uL (ref 4.0–10.5)
WBC: 9 10*3/uL (ref 4.0–10.5)
WBC: 9.8 10*3/uL (ref 4.0–10.5)

## 2010-10-12 LAB — POCT I-STAT, CHEM 8
HCT: 44 % (ref 36.0–46.0)
Hemoglobin: 15 g/dL (ref 12.0–15.0)
Potassium: 3.6 mEq/L (ref 3.5–5.1)
Sodium: 140 mEq/L (ref 135–145)
TCO2: 24 mmol/L (ref 0–100)

## 2010-10-12 LAB — POCT CARDIAC MARKERS
CKMB, poc: <1 ng/mL — ABNORMAL LOW (ref 1.0–8.0)
Myoglobin, poc: 48.7 ng/mL (ref 12–200)
Troponin i, poc: <0.05 ng/mL (ref 0.00–0.09)

## 2010-10-12 LAB — MRSA PCR SCREENING: MRSA by PCR: NEGATIVE

## 2010-10-12 LAB — TSH
TSH: 2.382 u[IU]/mL (ref 0.350–4.500)
TSH: 7.867 u[IU]/mL — ABNORMAL HIGH (ref 0.350–4.500)

## 2010-10-12 LAB — LIPID PANEL
LDL Cholesterol: 86 mg/dL (ref 0–99)
Total CHOL/HDL Ratio: 4 RATIO
Triglycerides: 206 mg/dL — ABNORMAL HIGH (ref <150)

## 2010-10-12 LAB — HEPATIC FUNCTION PANEL
AST: 38 U/L — ABNORMAL HIGH (ref 0–37)
Albumin: 3.8 g/dL (ref 3.5–5.2)
Alkaline Phosphatase: 85 U/L (ref 39–117)
Total Protein: 6.3 g/dL (ref 6.0–8.3)

## 2010-10-12 LAB — URINE CULTURE

## 2010-10-12 LAB — URINE MICROSCOPIC-ADD ON

## 2010-10-12 LAB — HEMOGLOBIN A1C
Hgb A1c MFr Bld: 6.1 % — ABNORMAL HIGH (ref <5.7)
Mean Plasma Glucose: 128 mg/dL — ABNORMAL HIGH (ref <117)

## 2010-10-12 LAB — LIPASE, BLOOD
Lipase: 191 U/L — ABNORMAL HIGH (ref 11–59)
Lipase: 205 U/L — ABNORMAL HIGH (ref 11–59)
Lipase: 22 U/L (ref 11–59)
Lipase: 320 U/L — ABNORMAL HIGH (ref 11–59)
Lipase: 35 U/L (ref 11–59)
Lipase: 98 U/L — ABNORMAL HIGH (ref 11–59)

## 2010-10-12 LAB — T4, FREE: Free T4: 0.82 ng/dL (ref 0.80–1.80)

## 2010-10-12 LAB — T3: T3, Total: 106.5 ng/dl (ref 80.0–204.0)

## 2010-10-12 LAB — SEDIMENTATION RATE: Sed Rate: 32 mm/hr — ABNORMAL HIGH (ref 0–22)

## 2010-10-12 LAB — C-REACTIVE PROTEIN: CRP: 2.9 mg/dL — ABNORMAL HIGH (ref <0.6)

## 2010-10-13 DIAGNOSIS — R8290 Unspecified abnormal findings in urine: Secondary | ICD-10-CM | POA: Insufficient documentation

## 2010-10-13 DIAGNOSIS — R102 Pelvic and perineal pain: Secondary | ICD-10-CM | POA: Insufficient documentation

## 2010-10-13 LAB — CBC
HCT: 44.8 % (ref 36.0–46.0)
Hemoglobin: 14.7 g/dL (ref 12.0–15.0)
Hemoglobin: 14.1 g/dL (ref 12.0–15.0)
MCHC: 33.4 g/dL (ref 30.0–36.0)
MCHC: 33.8 g/dL (ref 30.0–36.0)
MCHC: 34.8 g/dL (ref 30.0–36.0)
MCV: 91.4 fL (ref 78.0–100.0)
Platelets: 281 10*3/uL (ref 150–400)
Platelets: 252 10*3/uL (ref 150–400)
Platelets: 295 10*3/uL (ref 150–400)
RBC: 4.9 MIL/uL (ref 3.87–5.11)
RDW: 13 % (ref 11.5–15.5)
RDW: 12.8 % (ref 11.5–15.5)
RDW: 12.7 % (ref 11.5–15.5)

## 2010-10-13 LAB — DIFFERENTIAL
Basophils Absolute: 0 10*3/uL (ref 0.0–0.1)
Basophils Absolute: 0.1 10*3/uL (ref 0.0–0.1)
Basophils Relative: 0 % (ref 0–1)
Basophils Relative: 1 % (ref 0–1)
Eosinophils Absolute: 0.6 10*3/uL (ref 0.0–0.7)
Eosinophils Relative: 1 % (ref 0–5)
Eosinophils Relative: 5 % (ref 0–5)
Lymphocytes Relative: 18 % (ref 12–46)
Lymphocytes Relative: 19 % (ref 12–46)
Lymphs Abs: 2.7 10*3/uL (ref 0.7–4.0)
Lymphs Abs: 2.4 10*3/uL (ref 0.7–4.0)
Monocytes Absolute: 0.7 10*3/uL (ref 0.1–1.0)
Neutrophils Relative %: 70 % (ref 43–77)

## 2010-10-13 LAB — GLUCOSE, RANDOM: Glucose, Bld: 124 mg/dL — ABNORMAL HIGH (ref 70–99)

## 2010-10-13 LAB — URINALYSIS, ROUTINE W REFLEX MICROSCOPIC
Bilirubin Urine: NEGATIVE
Glucose, UA: NEGATIVE mg/dL
Hgb urine dipstick: NEGATIVE
Ketones, ur: NEGATIVE mg/dL
Nitrite: NEGATIVE
Nitrite: NEGATIVE
Specific Gravity, Urine: 1.01 (ref 1.005–1.030)
Urobilinogen, UA: 0.2 mg/dL (ref 0.0–1.0)
Urobilinogen, UA: 0.2 mg/dL (ref 0.0–1.0)
pH: 5.5 (ref 5.0–8.0)

## 2010-10-13 LAB — COMPREHENSIVE METABOLIC PANEL
AST: 23 U/L (ref 0–37)
CO2: 23 mEq/L (ref 19–32)
Calcium: 9 mg/dL (ref 8.4–10.5)
Chloride: 108 mEq/L (ref 96–112)
Creatinine, Ser: 0.62 mg/dL (ref 0.4–1.2)
GFR calc Af Amer: >60 mL/min (ref >60)
GFR calc non Af Amer: >60 mL/min (ref >60)
Glucose, Bld: 142 mg/dL — ABNORMAL HIGH (ref 70–99)
Potassium: 3.6 mEq/L (ref 3.5–5.1)
Total Bilirubin: 0.6 mg/dL (ref 0.3–1.2)

## 2010-10-13 LAB — BASIC METABOLIC PANEL
BUN: 13 mg/dL (ref 6–23)
BUN: 13 mg/dL (ref 6–23)
CO2: 24 mEq/L (ref 19–32)
CO2: 25 mEq/L (ref 19–32)
Calcium: 9.5 mg/dL (ref 8.4–10.5)
Calcium: 8.9 mg/dL (ref 8.4–10.5)
Creatinine, Ser: 0.73 mg/dL (ref 0.4–1.2)
GFR calc non Af Amer: >60 mL/min (ref >60)
GFR calc non Af Amer: >60 mL/min (ref >60)
Glucose, Bld: 218 mg/dL — ABNORMAL HIGH (ref 70–99)
Glucose, Bld: 115 mg/dL — ABNORMAL HIGH (ref 70–99)
Potassium: 3.5 mEq/L (ref 3.5–5.1)
Sodium: 136 mEq/L (ref 135–145)

## 2010-10-13 LAB — URINALYSIS, MICROSCOPIC ONLY
Glucose, UA: NEGATIVE mg/dL
Hgb urine dipstick: NEGATIVE
Nitrite: NEGATIVE
Protein, ur: NEGATIVE mg/dL
Specific Gravity, Urine: 1.036 — ABNORMAL HIGH (ref 1.005–1.030)
Urobilinogen, UA: 0.2 mg/dL (ref 0.0–1.0)

## 2010-10-13 LAB — TSH: TSH: 5.042 u[IU]/mL — ABNORMAL HIGH (ref 0.350–4.500)

## 2010-10-13 LAB — RPR: RPR Ser Ql: NONREACTIVE

## 2010-10-13 LAB — URINE CULTURE
Colony Count: 2000
Special Requests: POSITIVE

## 2010-10-13 LAB — LIPASE, BLOOD: Lipase: 46 U/L (ref 11–59)

## 2010-10-13 LAB — RAPID URINE DRUG SCREEN, HOSP PERFORMED
Amphetamines: NOT DETECTED
Barbiturates: NOT DETECTED
Benzodiazepines: POSITIVE — AB

## 2010-10-13 LAB — HEMOGLOBIN A1C: Hgb A1c MFr Bld: 6 % — ABNORMAL HIGH (ref <5.7)

## 2010-10-13 LAB — GC/CHLAMYDIA PROBE AMP, URINE
Chlamydia, Swab/Urine, PCR: NEGATIVE
GC Probe Amp, Urine: NEGATIVE

## 2010-10-14 LAB — URINALYSIS, ROUTINE W REFLEX MICROSCOPIC
Glucose, UA: NEGATIVE mg/dL
Leukocytes, UA: NEGATIVE
Protein, ur: NEGATIVE mg/dL
Specific Gravity, Urine: >1.03 — ABNORMAL HIGH (ref 1.005–1.030)
pH: 6 (ref 5.0–8.0)

## 2010-10-14 LAB — DIFFERENTIAL
Basophils Relative: 1 % (ref 0–1)
Eosinophils Relative: 8 % — ABNORMAL HIGH (ref 0–5)
Lymphocytes Relative: 21 % (ref 12–46)
Lymphs Abs: 2.7 10*3/uL (ref 0.7–4.0)
Monocytes Absolute: 0.8 10*3/uL (ref 0.1–1.0)
Neutro Abs: 8.2 10*3/uL — ABNORMAL HIGH (ref 1.7–7.7)
Neutrophils Relative %: 64 % (ref 43–77)

## 2010-10-14 LAB — BASIC METABOLIC PANEL
BUN: 7 mg/dL (ref 6–23)
Calcium: 8.9 mg/dL (ref 8.4–10.5)
Creatinine, Ser: 0.66 mg/dL (ref 0.4–1.2)
GFR calc Af Amer: >60 mL/min (ref >60)
GFR calc non Af Amer: >60 mL/min (ref >60)
Glucose, Bld: 150 mg/dL — ABNORMAL HIGH (ref 70–99)
Potassium: 3.2 mEq/L — ABNORMAL LOW (ref 3.5–5.1)

## 2010-10-14 LAB — URINE MICROSCOPIC-ADD ON

## 2010-10-14 LAB — URINE CULTURE: Colony Count: NO GROWTH

## 2010-10-14 LAB — CBC
HCT: 37.6 % (ref 36.0–46.0)
Platelets: 235 10*3/uL (ref 150–400)
RBC: 4.2 MIL/uL (ref 3.87–5.11)
WBC: 12.9 10*3/uL — ABNORMAL HIGH (ref 4.0–10.5)

## 2010-10-15 LAB — URINALYSIS, ROUTINE W REFLEX MICROSCOPIC
Bilirubin Urine: NEGATIVE
Glucose, UA: 250 mg/dL — AB
Hgb urine dipstick: NEGATIVE
Ketones, ur: NEGATIVE mg/dL
pH: 6 (ref 5.0–8.0)

## 2010-10-15 LAB — BASIC METABOLIC PANEL
BUN: 2 mg/dL — ABNORMAL LOW (ref 6–23)
Calcium: 8.5 mg/dL (ref 8.4–10.5)
Calcium: 9 mg/dL (ref 8.4–10.5)
Chloride: 107 mEq/L (ref 96–112)
Chloride: 108 mEq/L (ref 96–112)
Creatinine, Ser: 0.58 mg/dL (ref 0.4–1.2)
GFR calc Af Amer: >60 mL/min (ref >60)
GFR calc non Af Amer: >60 mL/min (ref >60)
Potassium: 3.7 mEq/L (ref 3.5–5.1)
Sodium: 140 mEq/L (ref 135–145)

## 2010-10-15 LAB — DIFFERENTIAL
Basophils Absolute: 0 10*3/uL (ref 0.0–0.1)
Eosinophils Absolute: 0.4 10*3/uL (ref 0.0–0.7)
Eosinophils Relative: 5 % (ref 0–5)
Lymphocytes Relative: 19 % (ref 12–46)
Lymphocytes Relative: 23 % (ref 12–46)
Lymphs Abs: 1.6 10*3/uL (ref 0.7–4.0)
Monocytes Absolute: 0.7 10*3/uL (ref 0.1–1.0)
Monocytes Relative: 7 % (ref 3–12)
Monocytes Relative: 9 % (ref 3–12)
Neutro Abs: 4.8 10*3/uL (ref 1.7–7.7)

## 2010-10-15 LAB — URINE CULTURE
Colony Count: NO GROWTH
Culture: NO GROWTH

## 2010-10-15 LAB — CBC
HCT: 38.1 % (ref 36.0–46.0)
Hemoglobin: 12.6 g/dL (ref 12.0–15.0)
MCV: 90 fL (ref 78.0–100.0)
Platelets: 244 10*3/uL (ref 150–400)
RBC: 3.99 MIL/uL (ref 3.87–5.11)
WBC: 8.3 10*3/uL (ref 4.0–10.5)

## 2010-10-15 LAB — LIPASE, BLOOD: Lipase: 39 U/L (ref 11–59)

## 2010-10-19 LAB — CBC
HCT: 41.8 % (ref 36.0–46.0)
Hemoglobin: 12.6 g/dL (ref 12.0–15.0)
Hemoglobin: 13.2 g/dL (ref 12.0–15.0)
MCHC: 34.9 g/dL (ref 30.0–36.0)
MCHC: 35 g/dL (ref 30.0–36.0)
MCHC: 35.3 g/dL (ref 30.0–36.0)
MCV: 89.7 fL (ref 78.0–100.0)
MCV: 90.1 fL (ref 78.0–100.0)
MCV: 90.2 fL (ref 78.0–100.0)
Platelets: 251 10*3/uL (ref 150–400)
RBC: 4.13 MIL/uL (ref 3.87–5.11)
RBC: 4.66 MIL/uL (ref 3.87–5.11)
RDW: 12.6 % (ref 11.5–15.5)
RDW: 13.1 % (ref 11.5–15.5)
WBC: 14.1 10*3/uL — ABNORMAL HIGH (ref 4.0–10.5)

## 2010-10-19 LAB — BASIC METABOLIC PANEL
BUN: 1 mg/dL — ABNORMAL LOW (ref 6–23)
BUN: 2 mg/dL — ABNORMAL LOW (ref 6–23)
CO2: 27 mEq/L (ref 19–32)
CO2: 28 mEq/L (ref 19–32)
CO2: 28 mEq/L (ref 19–32)
Calcium: 8.4 mg/dL (ref 8.4–10.5)
Calcium: 8.9 mg/dL (ref 8.4–10.5)
Chloride: 106 mEq/L (ref 96–112)
Chloride: 108 mEq/L (ref 96–112)
Chloride: 108 mEq/L (ref 96–112)
Creatinine, Ser: 0.58 mg/dL (ref 0.4–1.2)
GFR calc Af Amer: >60 mL/min (ref >60)
GFR calc Af Amer: >60 mL/min (ref >60)
GFR calc Af Amer: >60 mL/min (ref >60)
Glucose, Bld: 113 mg/dL — ABNORMAL HIGH (ref 70–99)
Potassium: 3.7 mEq/L (ref 3.5–5.1)
Sodium: 138 mEq/L (ref 135–145)
Sodium: 139 mEq/L (ref 135–145)
Sodium: 140 mEq/L (ref 135–145)

## 2010-10-19 LAB — URINE CULTURE: Colony Count: NO GROWTH

## 2010-10-19 LAB — LIPID PANEL
Cholesterol: 162 mg/dL (ref 0–200)
LDL Cholesterol: 106 mg/dL — ABNORMAL HIGH (ref 0–99)
Total CHOL/HDL Ratio: 5.8 RATIO
Triglycerides: 139 mg/dL (ref <150)
VLDL: 28 mg/dL (ref 0–40)

## 2010-10-19 LAB — URINALYSIS, ROUTINE W REFLEX MICROSCOPIC
Bilirubin Urine: NEGATIVE
Bilirubin Urine: NEGATIVE
Hgb urine dipstick: NEGATIVE
Nitrite: NEGATIVE
Protein, ur: NEGATIVE mg/dL
Specific Gravity, Urine: 1.025 (ref 1.005–1.030)
Specific Gravity, Urine: 1.025 (ref 1.005–1.030)
Urobilinogen, UA: 0.2 mg/dL (ref 0.0–1.0)
pH: 5.5 (ref 5.0–8.0)
pH: 5.5 (ref 5.0–8.0)

## 2010-10-19 LAB — DIFFERENTIAL
Basophils Absolute: 0 10*3/uL (ref 0.0–0.1)
Basophils Absolute: 0 10*3/uL (ref 0.0–0.1)
Basophils Absolute: 0 10*3/uL (ref 0.0–0.1)
Basophils Relative: 0 % (ref 0–1)
Basophils Relative: 0 % (ref 0–1)
Basophils Relative: 0 % (ref 0–1)
Basophils Relative: 0 % (ref 0–1)
Eosinophils Absolute: 0.3 10*3/uL (ref 0.0–0.7)
Eosinophils Absolute: 0.4 10*3/uL (ref 0.0–0.7)
Eosinophils Relative: 2 % (ref 0–5)
Eosinophils Relative: 4 % (ref 0–5)
Lymphocytes Relative: 12 % (ref 12–46)
Lymphocytes Relative: 19 % (ref 12–46)
Lymphs Abs: 1.6 10*3/uL (ref 0.7–4.0)
Lymphs Abs: 1.8 10*3/uL (ref 0.7–4.0)
Lymphs Abs: 2.4 10*3/uL (ref 0.7–4.0)
Monocytes Absolute: 0.5 10*3/uL (ref 0.1–1.0)
Monocytes Absolute: 0.6 10*3/uL (ref 0.1–1.0)
Monocytes Absolute: 0.8 10*3/uL (ref 0.1–1.0)
Monocytes Relative: 7 % (ref 3–12)
Monocytes Relative: 7 % (ref 3–12)
Neutro Abs: 10.4 10*3/uL — ABNORMAL HIGH (ref 1.7–7.7)
Neutro Abs: 5.9 10*3/uL (ref 1.7–7.7)
Neutrophils Relative %: 80 % — ABNORMAL HIGH (ref 43–77)

## 2010-10-19 LAB — LIPASE, BLOOD
Lipase: 100 U/L — ABNORMAL HIGH (ref 11–59)
Lipase: 52 U/L (ref 11–59)
Lipase: 588 U/L — ABNORMAL HIGH (ref 11–59)
Lipase: 651 U/L — ABNORMAL HIGH (ref 11–59)

## 2010-10-19 LAB — COMPREHENSIVE METABOLIC PANEL
ALT: 18 U/L (ref 0–35)
ALT: 23 U/L (ref 0–35)
AST: 22 U/L (ref 0–37)
Albumin: 3.8 g/dL (ref 3.5–5.2)
Alkaline Phosphatase: 67 U/L (ref 39–117)
Alkaline Phosphatase: 70 U/L (ref 39–117)
BUN: 9 mg/dL (ref 6–23)
CO2: 26 mEq/L (ref 19–32)
CO2: 27 mEq/L (ref 19–32)
Calcium: 9.3 mg/dL (ref 8.4–10.5)
Chloride: 106 mEq/L (ref 96–112)
Chloride: 109 mEq/L (ref 96–112)
Creatinine, Ser: 0.61 mg/dL (ref 0.4–1.2)
Creatinine, Ser: 0.68 mg/dL (ref 0.4–1.2)
GFR calc Af Amer: >60 mL/min (ref >60)
GFR calc non Af Amer: >60 mL/min (ref >60)
Glucose, Bld: 138 mg/dL — ABNORMAL HIGH (ref 70–99)
Potassium: 3.3 mEq/L — ABNORMAL LOW (ref 3.5–5.1)
Potassium: 3.9 mEq/L (ref 3.5–5.1)
Sodium: 138 mEq/L (ref 135–145)
Total Bilirubin: 1 mg/dL (ref 0.3–1.2)
Total Bilirubin: 1.1 mg/dL (ref 0.3–1.2)
Total Protein: 5.7 g/dL — ABNORMAL LOW (ref 6.0–8.3)
Total Protein: 6.7 g/dL (ref 6.0–8.3)

## 2010-10-19 LAB — CK: Total CK: 39 U/L (ref 7–177)

## 2010-10-19 LAB — MAGNESIUM: Magnesium: 1.8 mg/dL (ref 1.5–2.5)

## 2010-10-19 LAB — SEDIMENTATION RATE: Sed Rate: 14 mm/hr (ref 0–22)

## 2010-10-19 LAB — ANA: Anti Nuclear Antibody(ANA): NEGATIVE

## 2010-10-19 LAB — T4, FREE: Free T4: 0.8 ng/dL (ref 0.80–1.80)

## 2010-10-29 LAB — URINALYSIS, ROUTINE W REFLEX MICROSCOPIC
Glucose, UA: NEGATIVE mg/dL
Hgb urine dipstick: NEGATIVE
Leukocytes, UA: NEGATIVE
Specific Gravity, Urine: >1.03 — ABNORMAL HIGH (ref 1.005–1.030)
Urobilinogen, UA: 0.2 mg/dL (ref 0.0–1.0)

## 2010-10-29 LAB — GLUCOSE, CAPILLARY
Glucose-Capillary: 111 mg/dL — ABNORMAL HIGH (ref 70–99)
Glucose-Capillary: 145 mg/dL — ABNORMAL HIGH (ref 70–99)
Glucose-Capillary: 174 mg/dL — ABNORMAL HIGH (ref 70–99)
Glucose-Capillary: 188 mg/dL — ABNORMAL HIGH (ref 70–99)
Glucose-Capillary: 200 mg/dL — ABNORMAL HIGH (ref 70–99)
Glucose-Capillary: 208 mg/dL — ABNORMAL HIGH (ref 70–99)
Glucose-Capillary: 215 mg/dL — ABNORMAL HIGH (ref 70–99)
Glucose-Capillary: 228 mg/dL — ABNORMAL HIGH (ref 70–99)
Glucose-Capillary: 236 mg/dL — ABNORMAL HIGH (ref 70–99)
Glucose-Capillary: 264 mg/dL — ABNORMAL HIGH (ref 70–99)
Glucose-Capillary: 163 mg/dL — ABNORMAL HIGH (ref 70–99)
Glucose-Capillary: 166 mg/dL — ABNORMAL HIGH (ref 70–99)
Glucose-Capillary: 169 mg/dL — ABNORMAL HIGH (ref 70–99)
Glucose-Capillary: 185 mg/dL — ABNORMAL HIGH (ref 70–99)
Glucose-Capillary: 218 mg/dL — ABNORMAL HIGH (ref 70–99)

## 2010-10-29 LAB — DIFFERENTIAL
Basophils Absolute: 0 10*3/uL (ref 0.0–0.1)
Basophils Absolute: 0 10*3/uL (ref 0.0–0.1)
Basophils Absolute: 0.1 10*3/uL (ref 0.0–0.1)
Basophils Relative: 0 % (ref 0–1)
Basophils Relative: 0 % (ref 0–1)
Eosinophils Absolute: 0 10*3/uL (ref 0.0–0.7)
Eosinophils Absolute: 0 10*3/uL (ref 0.0–0.7)
Eosinophils Absolute: 0 10*3/uL (ref 0.0–0.7)
Eosinophils Absolute: 1.4 10*3/uL — ABNORMAL HIGH (ref 0.0–0.7)
Eosinophils Relative: 0 % (ref 0–5)
Eosinophils Relative: 8 % — ABNORMAL HIGH (ref 0–5)
Lymphocytes Relative: 25 % (ref 12–46)
Lymphocytes Relative: 5 % — ABNORMAL LOW (ref 12–46)
Lymphocytes Relative: 6 % — ABNORMAL LOW (ref 12–46)
Lymphocytes Relative: 7 % — ABNORMAL LOW (ref 12–46)
Lymphs Abs: 0.7 10*3/uL (ref 0.7–4.0)
Lymphs Abs: 0.7 10*3/uL (ref 0.7–4.0)
Lymphs Abs: 2.4 10*3/uL (ref 0.7–4.0)
Lymphs Abs: 1.2 10*3/uL (ref 0.7–4.0)
Monocytes Absolute: 0.2 10*3/uL (ref 0.1–1.0)
Monocytes Absolute: 0.7 10*3/uL (ref 0.1–1.0)
Monocytes Absolute: 0.7 10*3/uL (ref 0.1–1.0)
Monocytes Relative: 1 % — ABNORMAL LOW (ref 3–12)
Monocytes Relative: 7 % (ref 3–12)
Monocytes Relative: 4 % (ref 3–12)
Neutro Abs: 6.4 10*3/uL (ref 1.7–7.7)
Neutro Abs: 9.9 10*3/uL — ABNORMAL HIGH (ref 1.7–7.7)
Neutro Abs: 12.4 10*3/uL — ABNORMAL HIGH (ref 1.7–7.7)
Neutro Abs: 14.7 10*3/uL — ABNORMAL HIGH (ref 1.7–7.7)
Neutrophils Relative %: 92 % — ABNORMAL HIGH (ref 43–77)
Neutrophils Relative %: 94 % — ABNORMAL HIGH (ref 43–77)
Neutrophils Relative %: 81 % — ABNORMAL HIGH (ref 43–77)
Neutrophils Relative %: 93 % — ABNORMAL HIGH (ref 43–77)

## 2010-10-29 LAB — BASIC METABOLIC PANEL
BUN: 11 mg/dL (ref 6–23)
BUN: 15 mg/dL (ref 6–23)
BUN: 10 mg/dL (ref 6–23)
BUN: 8 mg/dL (ref 6–23)
CO2: 24 mEq/L (ref 19–32)
CO2: 26 mEq/L (ref 19–32)
CO2: 22 mEq/L (ref 19–32)
CO2: 26 mEq/L (ref 19–32)
Calcium: 8.4 mg/dL (ref 8.4–10.5)
Calcium: 8.7 mg/dL (ref 8.4–10.5)
Calcium: 8.8 mg/dL (ref 8.4–10.5)
Calcium: 8.6 mg/dL (ref 8.4–10.5)
Calcium: 9.2 mg/dL (ref 8.4–10.5)
Chloride: 109 mEq/L (ref 96–112)
Chloride: 109 mEq/L (ref 96–112)
Chloride: 104 mEq/L (ref 96–112)
Creatinine, Ser: 0.59 mg/dL (ref 0.4–1.2)
Creatinine, Ser: 0.64 mg/dL (ref 0.4–1.2)
Creatinine, Ser: 0.57 mg/dL (ref 0.4–1.2)
Creatinine, Ser: 0.74 mg/dL (ref 0.4–1.2)
GFR calc Af Amer: >60 mL/min (ref >60)
GFR calc Af Amer: >60 mL/min (ref >60)
GFR calc Af Amer: >60 mL/min (ref >60)
GFR calc Af Amer: >60 mL/min (ref >60)
GFR calc non Af Amer: >60 mL/min (ref >60)
GFR calc non Af Amer: >60 mL/min (ref >60)
GFR calc non Af Amer: >60 mL/min (ref >60)
GFR calc non Af Amer: >60 mL/min (ref >60)
Glucose, Bld: 173 mg/dL — ABNORMAL HIGH (ref 70–99)
Glucose, Bld: 189 mg/dL — ABNORMAL HIGH (ref 70–99)
Glucose, Bld: 156 mg/dL — ABNORMAL HIGH (ref 70–99)
Potassium: 4 mEq/L (ref 3.5–5.1)
Potassium: 4 mEq/L (ref 3.5–5.1)
Sodium: 139 mEq/L (ref 135–145)
Sodium: 139 mEq/L (ref 135–145)

## 2010-10-29 LAB — BLOOD GAS, ARTERIAL
Acid-base deficit: 2.5 mmol/L — ABNORMAL HIGH (ref 0.0–2.0)
Acid-base deficit: 2.8 mmol/L — ABNORMAL HIGH (ref 0.0–2.0)
Bicarbonate: 21.6 mEq/L (ref 20.0–24.0)
Delivery systems: POSITIVE
Expiratory PAP: 5
FIO2: 0.4 %
Inspiratory PAP: 10
O2 Content: 40 L/min
O2 Saturation: 97.4 %
O2 Saturation: 98.7 %
Patient temperature: 37
TCO2: 19.5 mmol/L (ref 0–100)
TCO2: 19.7 mmol/L (ref 0–100)
pCO2 arterial: 35.9 mmHg (ref 35.0–45.0)
pCO2 arterial: 37.9 mmHg (ref 35.0–45.0)
pH, Arterial: 7.397 (ref 7.350–7.400)
pO2, Arterial: 122 mmHg — ABNORMAL HIGH (ref 80.0–100.0)

## 2010-10-29 LAB — CBC
HCT: 35.1 % — ABNORMAL LOW (ref 36.0–46.0)
HCT: 37.3 % (ref 36.0–46.0)
Hemoglobin: 11.9 g/dL — ABNORMAL LOW (ref 12.0–15.0)
MCHC: 33.5 g/dL (ref 30.0–36.0)
MCHC: 34.1 g/dL (ref 30.0–36.0)
MCV: 89.8 fL (ref 78.0–100.0)
MCV: 90.9 fL (ref 78.0–100.0)
Platelets: 352 10*3/uL (ref 150–400)
Platelets: 381 10*3/uL (ref 150–400)
Platelets: 384 10*3/uL (ref 150–400)
RBC: 3.83 MIL/uL — ABNORMAL LOW (ref 3.87–5.11)
RBC: 3.96 MIL/uL (ref 3.87–5.11)
RBC: 4.11 MIL/uL (ref 3.87–5.11)
RDW: 13.4 % (ref 11.5–15.5)
RDW: 13.6 % (ref 11.5–15.5)
WBC: 10.8 10*3/uL — ABNORMAL HIGH (ref 4.0–10.5)
WBC: 15.3 10*3/uL — ABNORMAL HIGH (ref 4.0–10.5)
WBC: 9.5 10*3/uL (ref 4.0–10.5)
WBC: 18.1 10*3/uL — ABNORMAL HIGH (ref 4.0–10.5)

## 2010-10-29 LAB — LIPASE, BLOOD
Lipase: 29 U/L (ref 11–59)
Lipase: 27 U/L (ref 11–59)

## 2010-10-29 LAB — MAGNESIUM
Magnesium: 2.1 mg/dL (ref 1.5–2.5)
Magnesium: 1.9 mg/dL (ref 1.5–2.5)
Magnesium: 2.5 mg/dL (ref 1.5–2.5)
Magnesium: 2.7 mg/dL — ABNORMAL HIGH (ref 1.5–2.5)

## 2010-10-29 LAB — HEMOGLOBIN A1C
Hgb A1c MFr Bld: 6.3 % — ABNORMAL HIGH (ref 4.6–6.1)
Hgb A1c MFr Bld: 6.1 % (ref 4.6–6.1)
Mean Plasma Glucose: 134 mg/dL
Mean Plasma Glucose: 128 mg/dL

## 2010-10-29 LAB — URINE MICROSCOPIC-ADD ON

## 2010-10-29 LAB — D-DIMER, QUANTITATIVE: D-Dimer, Quant: 0.22 ug/mL-FEU (ref 0.00–0.48)

## 2010-10-29 LAB — AMYLASE: Amylase: 87 U/L (ref 27–131)

## 2010-10-29 LAB — PHOSPHORUS: Phosphorus: 2.6 mg/dL (ref 2.3–4.6)

## 2010-10-29 LAB — TSH: TSH: 0.62 u[IU]/mL (ref 0.350–4.500)

## 2010-11-01 LAB — COMPREHENSIVE METABOLIC PANEL
AST: 20 U/L (ref 0–37)
Alkaline Phosphatase: 75 U/L (ref 39–117)
BUN: 9 mg/dL (ref 6–23)
CO2: 23 mEq/L (ref 19–32)
Calcium: 9.5 mg/dL (ref 8.4–10.5)
Creatinine, Ser: 0.64 mg/dL (ref 0.4–1.2)
GFR calc non Af Amer: >60 mL/min (ref >60)
Glucose, Bld: 125 mg/dL — ABNORMAL HIGH (ref 70–99)

## 2010-11-01 LAB — URINALYSIS, ROUTINE W REFLEX MICROSCOPIC
Leukocytes, UA: NEGATIVE
Nitrite: NEGATIVE
Specific Gravity, Urine: >1.03 — ABNORMAL HIGH (ref 1.005–1.030)
Urobilinogen, UA: 0.2 mg/dL (ref 0.0–1.0)
pH: 5.5 (ref 5.0–8.0)

## 2010-11-01 LAB — URINE MICROSCOPIC-ADD ON

## 2010-11-01 LAB — CBC
Hemoglobin: 12.9 g/dL (ref 12.0–15.0)
MCHC: 34.4 g/dL (ref 30.0–36.0)
MCV: 92 fL (ref 78.0–100.0)
Platelets: 236 10*3/uL (ref 150–400)
RBC: 4.06 MIL/uL (ref 3.87–5.11)

## 2010-11-01 LAB — URINE CULTURE: Colony Count: 25000

## 2010-11-01 LAB — DIFFERENTIAL
Basophils Absolute: 0 10*3/uL (ref 0.0–0.1)
Basophils Relative: 0 % (ref 0–1)
Eosinophils Absolute: 0.4 10*3/uL (ref 0.0–0.7)
Eosinophils Relative: 4 % (ref 0–5)
Lymphocytes Relative: 25 % (ref 12–46)
Lymphs Abs: 3 10*3/uL (ref 0.7–4.0)
Neutrophils Relative %: 64 % (ref 43–77)

## 2010-11-01 LAB — LIPASE, BLOOD: Lipase: 38 U/L (ref 11–59)

## 2010-11-10 LAB — DIFFERENTIAL
Basophils Relative: 0 % (ref 0–1)
Eosinophils Absolute: 0 10*3/uL (ref 0.0–0.7)
Eosinophils Absolute: 0.7 10*3/uL (ref 0.0–0.7)
Eosinophils Relative: 0 % (ref 0–5)
Eosinophils Relative: 5 % (ref 0–5)
Lymphocytes Relative: 8 % — ABNORMAL LOW (ref 12–46)
Lymphs Abs: 1 10*3/uL (ref 0.7–4.0)
Lymphs Abs: 1.1 10*3/uL (ref 0.7–4.0)
Monocytes Absolute: 0 10*3/uL — ABNORMAL LOW (ref 0.1–1.0)
Monocytes Absolute: 0.3 10*3/uL (ref 0.1–1.0)
Monocytes Relative: 0 % — ABNORMAL LOW (ref 3–12)
Neutro Abs: 11.9 10*3/uL — ABNORMAL HIGH (ref 1.7–7.7)

## 2010-11-10 LAB — COMPREHENSIVE METABOLIC PANEL
ALT: 32 U/L (ref 0–35)
ALT: 36 U/L — ABNORMAL HIGH (ref 0–35)
AST: 18 U/L (ref 0–37)
AST: 21 U/L (ref 0–37)
Albumin: 3.5 g/dL (ref 3.5–5.2)
Alkaline Phosphatase: 80 U/L (ref 39–117)
Alkaline Phosphatase: 83 U/L (ref 39–117)
BUN: 7 mg/dL (ref 6–23)
CO2: 25 mEq/L (ref 19–32)
CO2: 25 mEq/L (ref 19–32)
Calcium: 9.2 mg/dL (ref 8.4–10.5)
Chloride: 105 mEq/L (ref 96–112)
Creatinine, Ser: 0.63 mg/dL (ref 0.4–1.2)
Creatinine, Ser: 0.64 mg/dL (ref 0.4–1.2)
GFR calc Af Amer: >60 mL/min (ref >60)
GFR calc Af Amer: >60 mL/min (ref >60)
GFR calc non Af Amer: >60 mL/min (ref >60)
GFR calc non Af Amer: >60 mL/min (ref >60)
Glucose, Bld: 122 mg/dL — ABNORMAL HIGH (ref 70–99)
Potassium: 3.7 mEq/L (ref 3.5–5.1)
Sodium: 139 mEq/L (ref 135–145)
Sodium: 140 mEq/L (ref 135–145)
Total Bilirubin: 0.7 mg/dL (ref 0.3–1.2)

## 2010-11-10 LAB — CBC
HCT: 33.4 % — ABNORMAL LOW (ref 36.0–46.0)
HCT: 34.3 % — ABNORMAL LOW (ref 36.0–46.0)
Hemoglobin: 10.8 g/dL — ABNORMAL LOW (ref 12.0–15.0)
MCHC: 31.6 g/dL (ref 30.0–36.0)
MCV: 75.9 fL — ABNORMAL LOW (ref 78.0–100.0)
MCV: 76 fL — ABNORMAL LOW (ref 78.0–100.0)
Platelets: 322 10*3/uL (ref 150–400)
RBC: 4.4 MIL/uL (ref 3.87–5.11)
RBC: 4.52 MIL/uL (ref 3.87–5.11)
WBC: 11.9 10*3/uL — ABNORMAL HIGH (ref 4.0–10.5)
WBC: 14 10*3/uL — ABNORMAL HIGH (ref 4.0–10.5)

## 2010-11-10 LAB — CULTURE, BLOOD (ROUTINE X 2)
Culture: NO GROWTH
Report Status: 1262010
Report Status: 1262010

## 2010-11-10 LAB — URINALYSIS, ROUTINE W REFLEX MICROSCOPIC
Glucose, UA: 100 mg/dL — AB
Ketones, ur: NEGATIVE mg/dL
Leukocytes, UA: NEGATIVE
Nitrite: NEGATIVE
Protein, ur: NEGATIVE mg/dL
Specific Gravity, Urine: <1.005 — ABNORMAL LOW (ref 1.005–1.030)
pH: 6 (ref 5.0–8.0)

## 2010-11-10 LAB — URINE MICROSCOPIC-ADD ON

## 2010-11-10 LAB — URINE CULTURE
Colony Count: NO GROWTH
Culture: NO GROWTH
Special Requests: NEGATIVE

## 2010-11-10 LAB — LIPASE, BLOOD: Lipase: 40 U/L (ref 11–59)

## 2010-11-11 LAB — PROTIME-INR
INR: 1 (ref 0.00–1.49)
Prothrombin Time: 13.6 seconds (ref 11.6–15.2)

## 2010-11-11 LAB — DIFFERENTIAL
Basophils Absolute: 0 10*3/uL (ref 0.0–0.1)
Basophils Absolute: 0 10*3/uL (ref 0.0–0.1)
Basophils Absolute: 0 10*3/uL (ref 0.0–0.1)
Basophils Relative: 0 % (ref 0–1)
Basophils Relative: 0 % (ref 0–1)
Basophils Relative: 0 % (ref 0–1)
Eosinophils Relative: 0 % (ref 0–5)
Eosinophils Relative: 0 % (ref 0–5)
Lymphocytes Relative: 4 % — ABNORMAL LOW (ref 12–46)
Lymphocytes Relative: 4 % — ABNORMAL LOW (ref 12–46)
Lymphs Abs: 0.6 10*3/uL — ABNORMAL LOW (ref 0.7–4.0)
Lymphs Abs: 0.7 10*3/uL (ref 0.7–4.0)
Lymphs Abs: 1.8 10*3/uL (ref 0.7–4.0)
Monocytes Absolute: 0.1 10*3/uL (ref 0.1–1.0)
Monocytes Relative: 1 % — ABNORMAL LOW (ref 3–12)
Monocytes Relative: 5 % (ref 3–12)
Neutro Abs: 10.6 10*3/uL — ABNORMAL HIGH (ref 1.7–7.7)
Neutro Abs: 14.7 10*3/uL — ABNORMAL HIGH (ref 1.7–7.7)
Neutrophils Relative %: 73 % (ref 43–77)
Neutrophils Relative %: 96 % — ABNORMAL HIGH (ref 43–77)

## 2010-11-11 LAB — URINALYSIS, ROUTINE W REFLEX MICROSCOPIC
Bilirubin Urine: NEGATIVE
Glucose, UA: 500 mg/dL — AB
Ketones, ur: NEGATIVE mg/dL
Nitrite: NEGATIVE
pH: 5.5 (ref 5.0–8.0)

## 2010-11-11 LAB — CBC
HCT: 33.5 % — ABNORMAL LOW (ref 36.0–46.0)
HCT: 35.8 % — ABNORMAL LOW (ref 36.0–46.0)
Hemoglobin: 10.9 g/dL — ABNORMAL LOW (ref 12.0–15.0)
Hemoglobin: 11.6 g/dL — ABNORMAL LOW (ref 12.0–15.0)
MCHC: 32.5 g/dL (ref 30.0–36.0)
MCHC: 32.5 g/dL (ref 30.0–36.0)
MCV: 80.2 fL (ref 78.0–100.0)
MCV: 81.6 fL (ref 78.0–100.0)
Platelets: 280 10*3/uL (ref 150–400)
RBC: 3.84 MIL/uL — ABNORMAL LOW (ref 3.87–5.11)
RBC: 4.17 MIL/uL (ref 3.87–5.11)
RBC: 4.47 MIL/uL (ref 3.87–5.11)
RDW: 23.5 % — ABNORMAL HIGH (ref 11.5–15.5)
RDW: 25.8 % — ABNORMAL HIGH (ref 11.5–15.5)
WBC: 15.4 10*3/uL — ABNORMAL HIGH (ref 4.0–10.5)
WBC: 16.7 10*3/uL — ABNORMAL HIGH (ref 4.0–10.5)

## 2010-11-11 LAB — URINE CULTURE
Culture: NO GROWTH
Special Requests: NEGATIVE

## 2010-11-11 LAB — BASIC METABOLIC PANEL
BUN: 10 mg/dL (ref 6–23)
Calcium: 8.3 mg/dL — ABNORMAL LOW (ref 8.4–10.5)
Chloride: 109 mEq/L (ref 96–112)
Creatinine, Ser: 0.59 mg/dL (ref 0.4–1.2)
GFR calc Af Amer: >60 mL/min (ref >60)
GFR calc Af Amer: >60 mL/min (ref >60)
GFR calc non Af Amer: >60 mL/min (ref >60)
GFR calc non Af Amer: >60 mL/min (ref >60)
Potassium: 3.7 mEq/L (ref 3.5–5.1)
Potassium: 3.9 mEq/L (ref 3.5–5.1)
Sodium: 138 mEq/L (ref 135–145)

## 2010-11-11 LAB — COMPREHENSIVE METABOLIC PANEL
Alkaline Phosphatase: 91 U/L (ref 39–117)
BUN: 13 mg/dL (ref 6–23)
Calcium: 9 mg/dL (ref 8.4–10.5)
Glucose, Bld: 99 mg/dL (ref 70–99)
Potassium: 3.9 mEq/L (ref 3.5–5.1)
Total Bilirubin: 0.4 mg/dL (ref 0.3–1.2)
Total Protein: 6.9 g/dL (ref 6.0–8.3)

## 2010-11-11 LAB — URINE MICROSCOPIC-ADD ON

## 2010-11-11 LAB — APTT: aPTT: 29 seconds (ref 24–37)

## 2010-11-21 ENCOUNTER — Emergency Department (HOSPITAL_COMMUNITY)
Admission: EM | Admit: 2010-11-21 | Discharge: 2010-11-21 | Disposition: A | Discharge status: Home / Self Care | Payer: Self-pay | Attending: Emergency Medicine | Admitting: Emergency Medicine

## 2010-11-21 DIAGNOSIS — R300 Dysuria: Secondary | ICD-10-CM | POA: Insufficient documentation

## 2010-11-21 DIAGNOSIS — N390 Urinary tract infection, site not specified: Secondary | ICD-10-CM | POA: Insufficient documentation

## 2010-11-21 DIAGNOSIS — R112 Nausea with vomiting, unspecified: Secondary | ICD-10-CM | POA: Insufficient documentation

## 2010-11-21 LAB — URINE MICROSCOPIC-ADD ON

## 2010-11-21 LAB — URINALYSIS, ROUTINE W REFLEX MICROSCOPIC
Glucose, UA: NEGATIVE mg/dL
Ketones, ur: NEGATIVE mg/dL
Nitrite: POSITIVE — AB
Specific Gravity, Urine: >1.03 — ABNORMAL HIGH (ref 1.005–1.030)
pH: 5.5 (ref 5.0–8.0)

## 2010-11-23 LAB — URINE CULTURE: Culture  Setup Time: 201204280153

## 2010-12-09 NOTE — Discharge Summary (Signed)
NAMEMarland [name]  SAAVI, MCEACHRON NO.:  [id number]   MEDICAL RECORD NO.:  [id number]          PATIENT TYPE:  OBV   LOCATION:  A325                          FACILITY:  [facility]   PHYSICIAN:  Lonia Blood, M.D.      DATE OF BIRTH:  [birth year]   DATE OF ADMISSION:  09/04/2008  DATE OF DISCHARGE:  02/12/2010LH                               DISCHARGE SUMMARY   PRIMARY CARE PHYSICIAN:  Dr. Sherwood Gambler.   DISCHARGE DIAGNOSES:  1. Acute chronic obstructive pulmonary disease exacerbation.  2. Hypertension.  3. Chronic headache.  4. Chronic pancreatitis.  5. Leukocytosis.  6. Chronic anemia.   DISCHARGE MEDICATIONS:  1. Iron tablets 65 mg twice a day.  2. Celexa 40 mg daily.  3. Amlodipine 2.5 mg daily.  4. Oxycodone/acetaminophen 5500 q.6 h p.r.n.  5. Metoprolol 25 mg daily.  6. Xanax 1 mg 4 times daily.  7. Prednisone taper.  8. Zithromax 250 mg daily.  9. The patient also takes albuterol and Atrovent as needed.   DISPOSITION:  The patient is being discharged home in good health.  She  is to follow up with Dr. Sherwood Gambler in the next week or so.   PROCEDURES PERFORMED:  Include chest x-ray on September 04, 2008 that  shows chronic peribronchial thickening, question related to bronchitis  or early active airway disease.  A follow-up chest x-ray on September 06, 2008 shows the same findings with no significant change.   CONSULTATIONS:  None.   BRIEF HISTORY AND PHYSICAL:  Please refer to dictated history and  physical on admission by Dr. Dorris Singh.  In short, however, this  is a 40 year old female well-known to our service with history of  tobacco smoking and COPD who came in with worsening shortness of breath  for 2 days.  The patient has been taking multiple medications for that.  She apparently tried nebulizers, but did not work.  She was found  hypoxic and in status asthmaticus, hence she was admitted for further  management.   HOSPITAL COURSE:  1. Status asthmaticus/COPD:   The patient was admitted and started on      IV steroids, antibiotics and nebulizers.  She seemed to have      responded to treatment so far and at this point is stable.  Her      wheezing has disappeared and she is stable for discharge.  2. Headache and chronic pain:  The patient complained of severe      headache which she attributed to the use of nebulizers.  She was      treated mainly with low-dose narcotic starting with Darvocet and      currently on oxycodone.  3. Hypertension:  Her blood pressure was controlled using her home      regimen.  4. Anxiety disorder:  We continued the Xanax while she was in the      hospital with no significant change.   Otherwise, the rest of her medical problems chronic, the patient was  maintained on her home regimen.      Lonia Blood, M.D.  Electronically Signed  LG/MEDQ  D:  09/07/2008  T:  09/07/2008  Job:  [id number]

## 2010-12-09 NOTE — Discharge Summary (Signed)
NAMEMarland [name]  [name], [name] NO.:  [id number]   MEDICAL RECORD NO.:  [id number]          PATIENT TYPE:  INP   LOCATION:  A337                          FACILITY:  [facility]   PHYSICIAN:  Dorris Singh, DO    DATE OF BIRTH:  August 24, 1970   DATE OF ADMISSION:  02/17/2008  DATE OF DISCHARGE:  07/28/2009LH                               DISCHARGE SUMMARY   ADMISSION DIAGNOSES:  1. Esophageal mass/stricture.  2. Severe anemia.   DISCHARGE DIAGNOSES:  1. Esophageal mass/stricture.  2. Severe anemia which has resolved.  3. Hypertension.  4. Headache.   PRIMARY CARE PHYSICIAN:  Madelin Rear. Fusco, MD.   DIAGNOSTICS:  1. CT of the head without contrast on February 18, 2008 which demonstrated      normal examination.  2. She also had an EGD done which demonstrated an area of probable      Barrett mucosal irregularity which biopsies were taken and a hiatal      hernia, but the full report can be viewed in Dr. Luvenia Starch operation      note.  However, they did find a tight peptic stricture and EG could      not actually put the scope through.   HOSPITAL COURSE:  The patient was admitted to the service of [facility]  and H&P was done by Dr. Elige Radon.  Please refer.  To summarize, the  patient is a 40 year old Caucasian female who presents to the office of  Dr. Sharyon Medicus office complaining of severe anemia in which they did a test  in the office that had it at 7.4.  She was admitted directly to our  service and GI was consulted.  We also transfused her 2 units of packed  red blood cells which she tolerated well.  Her hemoglobin came up from  originally being here at [facility] it was 6.4.  We transfused her, she  came up on admission date to 9.0.  She has remained stable.  It was  determined that patient was still having some issues with dysphagia, so  we have changed her diet.  She seems to be doing okay with a  mechanically soft diet and was advanced from clear liquids.  Also while  she was  here, she complained of severe headache.  In looking at her  vital signs, it was noted that her blood pressure had been elevated on  several readings.  At that point in time, I started her on Lopressor to  see if that would help.  It did ease her headache just slightly,  however, her diastolic pressure also increased as well.  At that point  in time, I also started her on Norvasc.  The patient states that since  she has been on the combination of the 2 medications, she has noticed  marked improvement in her headache.  I have explained to her as well as  with her family history, that this is probably occurring because of  hypertension and that she has a strong family history as well, that its  probably what the current cause is.  The patient stated understanding  of  that.   DISCHARGE MEDICATIONS:  She will go home on her current medications  which include:  1. Celexa 40 mg a day.  2. Norvasc 2.5 mg p.o. daily.  3. Lopressor 25 mg p.o. daily.  4. Protonix 40 mg p.o. b.i.d.   FOLLOW UP:  She will be contacted by Dr. Luvenia Starch office for follow up  appointment and for them to review her biopsies.  Then she can follow up  with her primary care physician when Dr. Jena Gauss feels that it is  appropriate.   CONDITION:  Stable.   DISPOSITION:  Home.   DIET:  She is to continue on a soft diet and to increase as tolerated.      Dorris Singh, DO  Electronically Signed     CB/MEDQ  D:  02/21/2008  T:  02/21/2008  Job:  [id number]

## 2010-12-09 NOTE — Group Therapy Note (Signed)
NAMEMarland Kitchen  SULEIKA, DONAVAN NO.:  [id number]   MEDICAL RECORD NO.:  [id number]          PATIENT TYPE:  INP   LOCATION:  A337                          FACILITY:  [facility]   PHYSICIAN:  Dorris Singh, DO    DATE OF BIRTH:  Jan 01, 1971   DATE OF PROCEDURE:  DATE OF DISCHARGE:                                 PROGRESS NOTE   Patient seen today resting comfortably in bed.  She is scheduled for an  EGD today and await the pending results.   PHYSICAL EXAMINATION:  VITAL SIGNS:  Today, temperature 98.2, pulse 92,  respirations 20, blood pressure 126/82.  GENERAL APPEARANCE:  Currently the patient is a 40 year old Caucasian  female who is well-developed, well-nourished, in no acute distress.  HEART:  Regular rate and rhythm.  LUNGS:  Clear to auscultation bilaterally.  ABDOMEN:  Soft, nontender and nondistended.  EXTREMITIES:  Positive pulses.  No edema, ecchymosis or cyanosis.   LABORATORY DATA:  White count 8.4, hemoglobin 9, hematocrit 29.4,  platelet count of 340.  Her sodium is 136, potassium 3.6, chloride 105,  CO2 26, glucose 101, BUN 3 and creatinine 0.59.   ASSESSMENT/PLAN:  1. Esophageal mass. Will have esophagogastroduodenoscopy scheduled for      today.  2. Anemia which is remaining stable.  Will continue to monitor patient      and change therapy as necessary.  Await any pending recommendations      from gastroenterology.      Dorris Singh, DO  Electronically Signed     CB/MEDQ  D:  02/20/2008  T:  02/20/2008  Job:  [id number]

## 2010-12-09 NOTE — H&P (Signed)
NAMEMarland Kitchen  [name], [name] NO.:  [id number]   MEDICAL RECORD NO.:  [id number]          PATIENT TYPE:  OBV   LOCATION:  A325                          FACILITY:  [facility]   PHYSICIAN:  Chelsea Singh, DO    DATE OF BIRTH:  02-20-1971   DATE OF ADMISSION:  09/04/2008  DATE OF DISCHARGE:  LH                              HISTORY & PHYSICAL   PRIMARY CARE [name]:  Dr. Sherwood [name].   CHIEF COMPLAINT:  Shortness of breath.   HISTORY OF PRESENT ILLNESS:  She apparently was seen here at [facility]  and admitted on January 20 and discharged for the same problem.  She  came in today complaining of shortness of breath where she had used her  home nebulizer and did not get any relief.  Symptoms are described as  moderate to severe.  Everything made it worse, nothing made it better.   PAST MEDICAL HISTORY:  Significant for chronic pancreatitis, esophageal  stricture, anemia, hypertension, migraines, asthma, and depression.   SURGICAL HISTORY:  Significant for cholecystectomy, hysterectomy,  tonsillectomy, and left ear repair.   ALLERGIES:  ASPIRIN, PENICILLIN, SULFA.   CURRENT MEDICATIONS:  1. Celexa 40 mg once a day.  2. Amlodipine 2.5 mg once a day.  3. Metoprolol tartrate 25 mg once a day.  4. Creon 24,000 with meals and snacks.  5. Oxycodone 5/500 four times a day.  6. Iron 65 mg twice a day.  7. Xanax 1 mg 4 times a day.   SOCIAL HISTORY:  She actually lives in [place].  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  She has an 37 year old son and 38 year old daughter.  She  smokes about 1/2 pack of cigarettes a day, she used to, but she quit  about 1 year ago.  She is a social drinker, but does not drink  regularly.  Denies any history of drug use.   FAMILY HISTORY:  Significant for laryngeal cancer.   REVIEW OF SYSTEMS:  Positive for shortness of breath and wheezing.  All  other systems are unremarkable.   PHYSICAL EXAM:  VITAL SIGNS:  Blood pressure 129/75, pulse 109,  respirations 28,  temperature 97.3.  CONSTITUTIONAL:  The patient is a well-developed, well-nourished 40-year-  old Caucasian female who is in no acute distress.  HEENT:  Normocephalic, atraumatic.  PERRLA.  EOMI.  Nose turbinates  moist.  Throat erythematosus.  NECK:  Supple.  There is no lymphadenopathy.  HEART:  Regular rate and rhythm.  No murmur noted.  LUNGS:  Positive inspiratory and expiratory wheezing in bilateral lung  fields.  ABDOMEN:  Soft, nontender, nondistended.  No organomegaly noted.  GENITOURINARY:  No CVA tenderness and normal external genitalia.  EXTREMITIES:  Positive pulses.  No cyanosis, clubbing, or edema.  Good  strength in all 4 extremities.  NEURO:  Cranial nerves 2-12 grossly intact.  Good sensation in all  extremities.  SKIN:  Good turgor.  Good texture.   LABORATORY STUDIES:  White count is 14.5, hemoglobin 11.6, hematocrit  35.8, platelet count 323,000.  Sodium is 140, potassium 3.9, chloride  108, CO2 24.  Glucose 99, BUN  15, creatinine 0.69.  Urine shows moderate  blood, trace protein, but negative for nitrates and leukocytes.  Chest x-  ray chronic peribronchial thickening, question related to bronchitis or  reactive airway disease.   ASSESSMENT AND PLAN:  1. Status asthmaticus.  Plan will be to admit the patient to the      service of [facility].  Will start her on IV steroids as well as      around the clock nebulizer treatments using albuterol and Atrovent.  2. Leukocytosis.  Will go ahead and start her on empiric antibiotics.  3. Chronic pancreatitis.  The patient is currently being seen and      evaluated by [facility].  She is able to eat a regular heart-healthy      diet.  Will continue her on that.   Will also do deep vein thrombosis and gastrointestinal prophylaxis.  She  is asking for pain management for her chronic pain issues.  Will go  ahead and she is asking for Darvocet p.o.  We will continue on this and  change therapy as necessary.      Chelsea Singh, DO  Electronically Signed     CB/MEDQ  D:  09/04/2008  T:  09/04/2008  Job:  [id number]

## 2010-12-09 NOTE — Discharge Summary (Signed)
[name], [name] NO.:  [id number]   MEDICAL RECORD NO.:  [id number]          PATIENT TYPE:  INP   LOCATION:  A316                          FACILITY:  [facility]   PHYSICIAN:  Skeet Latch, DO    DATE OF BIRTH:  [birth year]   DATE OF ADMISSION:  08/15/2008  DATE OF DISCHARGE:  01/21/2010LH                               DISCHARGE SUMMARY   PRIMARY CARE PHYSICIAN:  Chelsea Rear. Sherwood Gambler, MD.   DISCHARGE DIAGNOSES:  1. Acute exacerbation of asthma.  2. History of pancreatitis.  3. History of hypertension.  4. History of depression.  5. History of migraines.   BRIEF HOSPITAL COURSE:  This is a 40 year old female who has history of  asthma, recent pancreatitis, depression, who presented with shortness of  breath and wheezing.  The patient states over the last few weeks, she  was having exacerbation of her asthma.  The patient was placed on  antibiotics by her primary care physician and was seen by her primary  care physician multiple times in the last few weeks.  The patient states  she has continued to have cough and wheezing despite using her breathing  treatments at home.  The patient saw her primary care physician recently  and was sent to the emergency room to be evaluated.  Upon being seen in  the emergency room, the patient continued to wheeze and cough.  She was  given IV steroids and multiple breathing treatments without relief, and  the patient was admitted to a general medical bed.  The patient was  placed on IV steroids, breathing treatments and O2.  The patient has  dramatically improved over the last 12-24 hours.  At this time, we feel  the patient can be discharged to home.  Of note, the patient was also  placed on IV antibiotics.   DISCHARGE MEDICATIONS:  1. Celexa 40 mg daily.  2. Norvasc 2.5 mg daily.  3. Metoprolol 25 mg daily.  4. Tizanidine 4 mg as needed.  5. Hydrocodone 5/500 mg as needed.  6. Temazepam 15 mg at time.  7. Creon 2 tabs with  meals or snacks.  8. Doxycycline 100 mg twice a day for 7 days.  9. Medrol Dosepak as directed.   PHYSICAL EXAMINATION:  VITAL SIGNS on discharge:  Temperature 98.9,  respirations 20, heart rate 124, blood pressure 132/81.   LABORATORY DATA:  Hemoglobin 10.8, hematocrit 34.3, white count 11,900,  platelet count 322,000, sodium 140, potassium 3.5, chloride 108, CO2 25,  BUN 7, creatinine 0.64, glucose 148, AST 18, ALT 32, alkaline  phosphatase 83, T-bilirubin 0.7, calcium 9.2.   DIAGNOSTICS:  The patient had a chest x-ray that showed no acute  infiltrate or edema.  Mild bilateral perihilar and infrahilar increased  bronchial markings.  Findings may be due to bronchial changes or  reactive airway disease.   CONDITION ON DISCHARGE:  Stable.   DISPOSITION:  The patient will be discharged to home.   DISCHARGE INSTRUCTIONS:  1. The patient may return to work next Wednesday.  2. The patient is to maintain a low-fat, heart-healthy diet.  3.  She will increase her activity slowly than previous.  4. The patient is to follow up with Dr. Sherwood [name] in the next 2-3 days or      sooner if she has severe symptoms and/or call 911.  5. The patient is to take all her medications as prescribed.   I believe the patient understands these instructions at this time.      Skeet Latch, DO  Electronically Signed     SM/MEDQ  D:  08/16/2008  T:  08/16/2008  Job:  [phone]   cc:   Chelsea Rear. Sherwood Gambler, MD  Fax: [phone]

## 2010-12-09 NOTE — H&P (Signed)
NAMEMarland [name]  [name], SINE NO.:  [id number]   MEDICAL RECORD NO.:  [id number]          PATIENT TYPE:  INP   LOCATION:  A337                          FACILITY:  [facility]   PHYSICIAN:  Dorris Singh, DO    DATE OF BIRTH:  [birth year]   DATE OF ADMISSION:  02/17/2008  DATE OF DISCHARGE:  LH                              HISTORY & PHYSICAL   CHIEF COMPLAINT:  Weakness.   The patient is a 40 year old Caucasian female who presented to her  primary care's office, Dr. Sherwood Gambler, complaining of weakness.  She has had  multiple testing done over the last couple of days.  She had a barium  swallow which identified esophageal mass.  She was found in the office  to have a hemoglobin of 7.7.  He therefore called Dr. Elige Radon for a  direct admission to the hospital for blood transfusion.  Also, his plan  was to have the patient follow-up with GI, but we will go ahead and  consult [facility] while she is here in  the hospital to investigate this esophageal mass further.   SHE HAS ALLERGIES TO PENICILLIN AND LORCET.   MEDICATIONS:  She is on:  1. Celexa 40 mg p.o. daily.  2. Albuterol nebulizers p.r.n.   Her surgical history includes:  1. Total hysterectomy.  2. Cholecystectomy.  3. Tonsillectomy and Adenoidectomy.   Also, her past medical history is significant for migraine headaches,  depression, anemia, and asthma.  She also states her past medical  history is significant for dysphagia for several months as well.   REVIEW OF SYSTEMS:  Positive for dysphagia weakness changes in appetite,  nausea, vomiting, headache.  No chest pain, shortness of breath,  diarrhea, hesitancy or dysuria, arthralgias or chest pain.   PHYSICAL EXAMINATION:  VITALS:  Are as follows.  Blood pressure is  145/98, temperature 97.4, pulse 110, respirations 16.  GENERAL:  The patient is a 40 year old Caucasian female who is well-  developed, well-nourished in no acute distress.  HEAD:  Is normocephalic, atraumatic.  EYES: Pupils are equal and  reactive to light bilaterally.  TEETH:  Are in good repair.  NOSE:  Turbinates are moist.  EARS:  Are symmetrical.  TMIs visualized  bilaterally.  NECK:  Is supple.  No lymphadenopathy noted or mass noted.  HEART:  Is regular rate and rhythm.  S1-S2 heard.  No murmurs, gallops  or rubs.  LUNGS:  Clear to auscultation bilaterally.  No wheezes, rales or  rhonchi.  ABDOMEN:  Soft, nontender, nondistended.  No organomegaly noted.  EXTREMITIES:  Positive pulses.  No ecchymosis, edema or cyanosis.  Good  strength.  MUSCULOSKELETAL:  Good strength throughout.  NEUROLOGIC:  Cranial nerves II-XII grossly intact.  The patient is A&O  x3.  Sensation is within normal limits.   Her labs are pending currently.   ASSESSMENT AND PLAN:  1. Esophageal mass/stricture.  2. Severe anemia.  3. Elevated blood pressure.  4. Tachy sinus.  5. Migraine headaches.   PLAN:  Will admit patient to the service of [facility].  We will start  her on IV  fluids.  Also will repeat her CBC and BMET prior to  transfusion but will go ahead and transfuse her 2 units of packed red  blood cells.  Will put her on Protonix and DVT prophylaxis with TED hose  to the legs.  Will place her on a clear liquid diet.  Will consult GI to  come see her.  Currently, she is having a migraine headache.  We will go  ahead and prescribe some Imitrex and Tylenol for her as well.  Will  place her on all her home medications and will continue to monitor her  and await any other recommendations that GI may have for this patient's  case.      Dorris Singh, DO  Electronically Signed     CB/MEDQ  D:  02/17/2008  T:  02/17/2008  Job:  [id number]   cc:   Madelin Rear. Sherwood Gambler, MD  Fax: [phone]

## 2010-12-09 NOTE — Group Therapy Note (Signed)
NAMEMarland [name]  KEVONA, LUPINACCI NO.:  [id number]   MEDICAL RECORD NO.:  [id number]          PATIENT TYPE:  INP   LOCATION:  A340                          FACILITY:  [facility]   PHYSICIAN:  Margaretmary Dys, M.D.DATE OF BIRTH:  05-21-1971   DATE OF PROCEDURE:  07/15/2008  DATE OF DISCHARGE:                                 PROGRESS NOTE   SUBJECTIVE:  The patient feels better today.  She denies any more nausea  or vomiting.  She did have some mild abdominal pain.  She rates it a  3/10.  The patient was admitted with acute pancreatitis.  The patient  has had multiple episodes of pancreatitis in the past with no clear  diagnosis.  The patient is being followed at [facility] and is  scheduled to follow up with them on January 7.  The patient is unsure if  she has had an ERCP done.  The patient feels better this morning.   OBJECTIVE:  GENERAL:  Conscious, alert, comfortable, not in acute  distress.  Well-oriented in time, place and person.  VITAL SIGNS:  Blood pressure was 120/58 with a pulse of 120,  respirations 28, temperature 99.2 degrees Fahrenheit, oxygen saturation  was 97% on 3 liters.  HEENT:  Normocephalic, atraumatic.  Oral mucosa was dry.  No exudates  were noted.  NECK:  Supple.  No JVD or lymphadenopathy.  LUNGS:  Reduced air entry bilaterally.  HEART:  S1-S2, regular, no S3, S4, gallops or rubs.  ABDOMEN:  Abdomen was soft, some mild tenderness noted in the right  hypochondrium otherwise was unremarkable.  Bowel sounds were positive.  No guarding or rebound was noted.  EXTREMITIES:  No edema.  No calf induration or tenderness was noted.  CNS:  Exam was grossly intact with no focal neurological deficits.   LABORATORY DIAGNOSTIC DATA:  White blood cell count was 10.1, hemoglobin  of 7.6, hematocrit 24.5, platelet count was 294.  Sodium 137, potassium  is 3.5, chloride of 108, CO2 was 24, BUN of 5, creatinine was 0.6,  glucose 127, lipase was 269.   ASSESSMENT:  1. Acute recurrent pancreatitis, etiology remains unclear.  2. History of hypertension  3. Possible urinary tract infection.   PLAN:  1. Continue with IV fluid hydration.  2. Will start on a clear liquid diet and advance as tolerated.  Will      continue on hydromorphone for pain control p.r.n.  3. Continue on proton pump inhibitor at this time.  Her lipase levels      have now returned significantly improved.   We will plan to hopefully stabilize her and have her discharged home for  her January 7 appointment.  At this time she is not showing any  worsening symptoms to suggest that the patient will need immediate  transfer for further evaluation.  The patient has had no fever, has not  become hypotensive and there is no evidence of acute respiratory  distress syndrome at this time.      Margaretmary Dys, M.D.  Electronically Signed     AM/MEDQ  D:  07/15/2008  T:  07/15/2008  Job:  [id number]

## 2010-12-09 NOTE — Discharge Summary (Signed)
NAMEMarland Kitchen  ERRYN, DICKISON             ACCOUNT NO.:  [id number]   MEDICAL RECORD NO.:  [id number]          PATIENT TYPE:  INP   LOCATION:  A340                          FACILITY:  [facility]   PHYSICIAN:  Margaretmary Dys, M.D.DATE OF BIRTH:  1970-09-02   DATE OF ADMISSION:  07/13/2008  DATE OF DISCHARGE:  12/23/2009LH                               DISCHARGE SUMMARY   DISCHARGE DIAGNOSES:  1. Acute recurrent pancreatitis of unclear etiology.  The patient is      currently undergoing evaluation at [facility].  2. Acute on chronic abdominal pain.  3. History of hypertension.  4. Urinary tract infection.   DISCHARGE MEDICATIONS:  Celexa 40 mg p.o. once a day, Norvasc 2.5 mg  p.o. once a day, metoprolol 25 mg p.o. daily, tizanidine 4 mg as needed,  hydrocodone 5/500 mg as needed, temazepam 15 mg at bedtime, Creon  tablets 2 tablets with meals or snacks.   FOLLOW-UP PLAN:  1. The patient is to follow up at [facility] on August 02, 2008 for pancreatitis and further evaluation.  It appears the      patient may be going for an ERCP.  2. The patient is to follow up with her primary care physician, Dr.      Elfredia Nevins, in 3-4 weeks if she develops any significant      abdominal pain.   PERTINENT LABORATORY DATA:  On the day of admission, urinalysis shows  trace leukocyte esterase, a few epithelial cells.  White blood cell  count was 17,200, hemoglobin of 10, platelet count was 432 with a left  shift.  Sodium was 137, potassium 4.1, chloride of 104, CO2 was 27,  glucose 103, BUN of 10, creatinine was 0.62.  Calcium was 9.  Serum  protein 6.7.  Albumin was 3.71.  Liver function test was normal.  Lipase  was 1405.  A wet vaginal prep showed no yeast or Trichomonas.  She had  some clue cells and also a few WBCs.   DIET:  Low-fat diet.   HOSPITAL COURSE:  Ms. Mcauliffe is a 40 year old female with a history of  chronic pancreatitis status post ileo-colonoscopy by Dr. Jena Gauss  on  June 04, 2008 with no significant findings.  She has been referred to  [facility] and is currently awaiting further evaluation.  The patient presented to the hospital secondary to abdominal pain,  nausea and vomiting.  The patient reports that the pain was similar to  the one she had before.  It was centrally located in the epigastric  region and on the side.  The patient denies any vomiting though she has  had some nausea.  She had no hematochezia, no melenic stools.  The  patient was evaluated in the emergency room.  The patient was found to  be in some pain distress.  Vitals:  Blood pressure was 169/94 with a  pulse of 105, respirations were 16, oxygen saturation was 95% on room  air, temperature was 98.3 degrees Fahrenheit.  Abdominal exam showed  some mild tenderness in the epigastric area.  The patient was then admitted to the medical floor.  The patient was  given IV fluid therapy.   The patient was monitored closely with telemetry.  She remained mostly  stable with good blood pressure.  The patient had no evidence for  desaturation.   The patient's abdominal pain slowly improved during the course of  hospitalization.   Her lipase also significantly improved and was normal, down to 82 at the  time of discharge.  The lipid profile was normal.   The patient remained mostly stable with no significant concerns and did  improve daily.  She was subsequently discharged home.  She was advised  to try to keep her appointment on August 02, 2008 at [facility].   DISPOSITION:  To home.      Margaretmary Dys, M.D.  Electronically Signed     AM/MEDQ  D:  08/21/2008  T:  08/21/2008  Job:  [id number]

## 2010-12-09 NOTE — Group Therapy Note (Signed)
[name], [name] NO.:  [id number]   MEDICAL RECORD NO.:  [id number]          PATIENT TYPE:  INP   LOCATION:  A337                          FACILITY:  [facility]   PHYSICIAN:  Dorris Singh, DO    DATE OF BIRTH:  [birth year]   DATE OF PROCEDURE:  02/18/2008  DATE OF DISCHARGE:                                 PROGRESS NOTE   The patient is seen today resting in bed comfortably.  She was seen by  Dr. Jena Gauss.  She was given the option of being discharged for today and  then possibly coming back for outpatient scoping.  I am awaiting the  patient's decision to see what she would like to do.  If she wants to go  home today we can, if not we can keep her.  She is still having issues  with swallowing and eating and staying hydrated so that may be a reason  to keep her until Monday as well.   PHYSICAL EXAMINATION:  VITAL SIGNS:  Temperature is 99.2, pulse 101,  respirations 20, blood pressure 138/81.   LABS:  Her H and H was 9.6, hematocrit 31.2, platelet count is 383 and  white count of 7.7.  Her chemistry - sodium 141, potassium 3.7, chloride  108, CO2 27, glucose 121, creatinine 0.72, BUN 5.   ASSESSMENT AND PLAN:  1. Severe anemia.  This has been corrected with blood transfusion.      Will go ahead and continue to monitor her with serial H and H's.  2. Esophageal mass.  Will await the patient's decision as to whether      or not she wants to go home and she can be scoped outpatient if she      wants to stay in until Monday.  3. Nausea and vomiting.  This has been continual throughout her      admission so will continue to watch this and give her antiemetics.      Dorris Singh, DO  Electronically Signed     CB/MEDQ  D:  02/18/2008  T:  02/18/2008  Job:  [id number]

## 2010-12-09 NOTE — Op Note (Signed)
NAME:  [name], [name]             ACCOUNT NO.:  [id number]   MEDICAL RECORD NO.:  [id number]          PATIENT TYPE:  AMB   LOCATION:  DAY                           FACILITY:  [facility]   PHYSICIAN:  R. Roetta Sessions, M.D. DATE OF BIRTH:  [birth year]   DATE OF PROCEDURE:  03/12/2008  DATE OF DISCHARGE:                               OPERATIVE REPORT   INDICATIONS FOR PROCEDURE:  A 40 year old lady with rapidly progressive  esophageal dysphagia was seen last month.  Barium esophagram  demonstrated high-grade obstruction, question of tumor.  EGD on February 20, 2008, demonstrated high grade what appeared to be benign peptic  stricture status post TTS balloon dilation (13 mm) irregular distal  esophageal mucosa was suspicious in appearance on February 20, 2008, but  biopsies failed to demonstrate anything concerning for neoplasm.  Her  dysphagia dramatically improved for a couple of weeks but just recently  dysphagia has recurred on Prilosec 20 mg orally b.i.d.  EGD is being  done to reassess stricture and perform additional dilation as  appropriate.  Risks, benefits, alternatives, and limitations have been  discussed with Ms. Diluzio, questions answered, and she is agreeable.  Please see documentation in the medical record.   PROCEDURE NOTE:  O2 saturation, blood pressure, and pulse of the patient  monitored throughout the entire procedure.  Conscious sedation, Versed 5  mg IV and Demerol 125 g IV in divided doses.  Cetacaine spray for  topical pharyngeal anesthesia.   INSTRUMENT:  Pentax video chip system.   FINDINGS:  Examination of tubular esophagus again revealed a tight  peptic stricture, distal esophagus.  I did not really see anything that  resembled a neoplastic process.  EG junction was somewhat undulating.  No obvious Barrett-appearing mucosa.  The distal esophagus initially  held off.  Scope traversable with gentle pressure.  I was able to pop  through this area down into the stomach.   This was associated with mild  traumatic dilation of the stricture.  Stomach:  Gastric cavity was empty  and insufflated well with air.  Thorough examination of the gastric  mucosa including retroflexion at the proximal stomach and  esophagogastric junction demonstrated a moderate-size hiatal hernia.  EG  junction was seen well, and I did not see any evidence of neoplasia.  Pylorus patent and easily traversed.  Examination of the bulb, second  portion revealed no abnormality.  I pulled the scope back up until the  area of stricture and it was opened up somewhat with the scope.  It was  short in length and appeared to be a minimal passage of a bougie.  Consequently, I removed the scope and advanced a 54-French Maloney  dilator with mild resistance upon full insertion.  A look back revealed  a stricture had been dilated up nicely without apparent complications.  There was minimal bleeding.  The abnormal mucosa at the level of  stricture was biopsied for histologic study.  The patient tolerated the  procedure well and was reactive to Endoscopy.   IMPRESSION:  Relatively high-grade short now what appears to be benign  peptic stricture  status post dilation described above.  Biopsies taken  once again, moderately large hiatal hernia, otherwise, normal stomach,  D1, and D2.   RECOMMENDATIONS:  1. Continue Prilosec 20 mg orally b.i.d.  2. Follow up on path.  I suspect biopsies will come back benign and      also suspect this would be a relatively recalcitrant stricture.  I      told Ms. Schubert the plan will be semi-biopsies are okay.  Continue      Prilosec 20 mg orally b.i.d.  We will plan to bring this lady back      in 3 months for repeat EGD and dilation as appropriate.      Jonathon Bellows, M.D.  Electronically Signed     RMR/MEDQ  D:  03/12/2008  T:  03/12/2008  Job:  [id number]   cc:   Madelin Rear. Sherwood Gambler, MD  Fax: [phone]

## 2010-12-09 NOTE — Group Therapy Note (Signed)
NAMEMarland Kitchen  AAIMA, GADDIE NO.:  [id number]   MEDICAL RECORD NO.:  [id number]          PATIENT TYPE:  INP   LOCATION:  A337                          FACILITY:  [facility]   PHYSICIAN:  Dorris Singh, DO    DATE OF BIRTH:  February 18, 1971   DATE OF PROCEDURE:  02/19/2008  DATE OF DISCHARGE:                                 PROGRESS NOTE   SUBJECTIVE:  The patient seen today resting comfortably in bed.  She  says she has not felt like eating.  She has been a little nauseated over  the night, but she spoke with Dr. Jena Gauss, and they are planning to an EGD  tomorrow morning.  The patient is complaining of continual headache.   PHYSICAL EXAMINATION:  VITAL SIGNS:  Temperature 98.5, pulse 88,  respirations 20, blood pressure 123/92.  GENERAL:  The patient is an 40 year old Caucasian female who is well-  developed, well-nourished, in no acute distress.  HEART:  Regular rate and rhythm.  LUNGS:  Clear to auscultation bilaterally.  ABDOMEN:  Soft, nontender, nondistended.  EXTREMITIES:  Positive pulses.  No ecchymosis, edema or cyanosis.   LABORATORY DATA:  Her white count is 7.4, hemoglobin 9.1, hematocrit  29.9 and her platelet count is 351.  Her sodium is 141, potassium 3.4,  chloride 110, CO2 of 27, glucose 108, BUN 5, creatinine 0.69.   ASSESSMENT/PLAN:  1. Esophageal mass.  2. Anemia.  3. Hypertension.  4. Headache.   PLAN:  1. The patient is planned for an EGD on Monday.  2. For anemia, will continue to monitor her with CBCs.  3. Headache.  Start the patient on Lopressor 25 mg.  Also may add      Norvasc, as she looks like she has diastolic blood pressure as      well, and see if that helps improve her readings.  Due to her      continual complaints of headaches, will also get a CT of her head      today.      Dorris Singh, DO  Electronically Signed    CB/MEDQ  D:  02/19/2008  T:  02/19/2008  Job:  [id number]

## 2010-12-09 NOTE — H&P (Signed)
NAMEMarland Kitchen  Chelsea, [name]             ACCOUNT NO.:  [id number]   MEDICAL RECORD NO.:  [id number]          PATIENT TYPE:  AMB   LOCATION:  DAY                           FACILITY:  [facility]   PHYSICIAN:  R. Roetta Sessions, M.D. DATE OF BIRTH:  [birth year]   DATE OF ADMISSION:  DATE OF DISCHARGE:  LH                              HISTORY & PHYSICAL   CHIEF COMPLAINT:  Problem swallowing, abdominal pain, nausea, and  diarrhea.   PHYSICIAN:  R. Roetta Sessions, MD   HISTORY OF PRESENT ILLNESS:  The patient is a pleasant 40 year old lady  with a history of esophageal stricture with benign biopsies thus far,  who presents with recurrent dysphagia.  She had her last EGD on March 12, 2008.  She still had a relatively high-grade short benign appearing  peptic stricture which was dilated initially somewhat with the scope.  A  54-French Maloney dilator was passed with mild resistance upon full  insertion.  Again, the abnormal mucosa at the level of the stricture was  biopsied, again was benign.  She states that her swallowing did better  for about a week.  This week, she started the same problems with meat  and bread again.  She could not keep the food down, and she vomited this  out.  She has had to wash lots of her food down with liquid.  If the  food goes down, she has some postprandial nausea.  She has also had some  postprandial right upper quadrant pain.  These symptoms have been  occurring for about 2 months.  For the last few days, she has had some  loose stools with abdominal cramping.  She had 3 bowel movements  yesterday and one loose stool once a today.  She denies any blood in the  stool, nausea with any antibiotic use.  She has been on omeprazole 20 mg  once or twice daily.  Denies any fevers or chills.   CURRENT MEDICATIONS:  1. Metoprolol 25 mg daily.  2. Celexa 40 mg daily.  3. Amlodipine 2.5 mg daily.  4. Temazepam 15 mg nightly.  5. Tizanidine 4 mg p.r.n.  6. Prilosec 20 mg 2  times daily.   ALLERGIES:  PENICILLIN causes respiratory problems.   PAST MEDICAL HISTORY:  Migraine headaches, depression, asthma,  esophageal stricture with dilations as above and history of profound  anemia for which she presents to the hospital back in July 2009.   PAST SURGICAL HISTORY:  Hysterectomy, cholecystectomy, tonsillectomy,  and adenoidectomy, left ear surgery.   SOCIAL HISTORY:  She has 2 grandchildren.  One 91 year old son, in the  [employer] and 64 year old daughter.  History of smoking 1/2 pack of  cigarettes daily.  Rare alcohol consumption.  No history of illicit drug  use.   FAMILY HISTORY:  Her grandfather is laryngeal/throat cancer.   REVIEW OF SYSTEMS:  GI:  As in HPI.  CONSTITUTIONAL:  No weight loss.  CARDIOPULMONARY:  No chest pain or shortness of breath.  GENITOURINARY:  No dysuria or hematuria.   PHYSICAL EXAMINATION:  VITAL SIGNS:  Weight 182, height 5 feet 3  inches,  temp 98.3, blood pressure 120/84, and pulse 80.  GENERAL:  Pleasant, well-nourished, well-developed Caucasian female in  no acute distress.  SKIN:  Warm and dry.  No jaundice.  HEENT:  Sclera nonicteric.  Oropharyngeal mucosa moist and pink.  No  lesion, erythema, or exudates.  No lymphadenopathy or thyromegaly.  CHEST:  Lungs are clear to auscultation.  CARDIAC:  Regular rate and rhythm.  No murmurs, rubs, or gallops.  ABDOMEN:  Positive bowel sound.  Abdomen is soft, just mild tenderness  on the right upper quadrant region to deep palpitation.  No rebound or  guarding.  No organomegaly or masses.  No abdominal bruits or hernia.  LOWER EXTREMITIES:  No edema.   LABS:  Hemoglobin of 6.4 requiring 2 units of packed red blood cells.   IMPRESSION:  The patient is a 40 year old lady with history of  significant esophageal stricture requiring two episodes of dilation.  Thus far biopsies have been benign, for both Barrett's esophagus or  malignancy.  It is felt that she has a benign peptic  stricture at this  point.  She did have some resolution of her dysphagia symptoms for about  a week and a half but now symptoms are recurring.  She also has  developed postprandial nausea and right upper quadrant abdominal pain  over the last months or so.  She is already status post cholecystectomy  years ago and this has reoccurred.  She has a history of profound  anemia.  We need to follow up on this as well.  Recent diarrhea, unclear  etiology that she has no increased risk for Clostridium difficile given  recent hospitalization.   PLAN:  1. EGD with dilation by Dr. Jena Gauss in the near future.  2. CBC, lipase, LFTs .  3. Prilosec 20 mg b.i.d.  4. Stool for culture.  5. If nothing found to explain her abdominal pain, nausea, may      consider CT of abdomen and pelvis at some point in the future.      Tana Coast, P.AJonathon Bellows, M.D.  Electronically Signed    LL/MEDQ  D:  03/29/2008  T:  03/30/2008  Job:  [id number]   cc:   Madelin Rear. Sherwood Gambler, MD  Fax: [phone]   [facility]

## 2010-12-09 NOTE — Consult Note (Signed)
NAMEMarland Kitchen  LOGEN, FOWLE             ACCOUNT NO.:  [id number]   MEDICAL RECORD NO.:  [id number]          PATIENT TYPE:  INP   LOCATION:  A337                          FACILITY:  [facility]   PHYSICIAN:  R. Roetta Sessions, M.D. DATE OF BIRTH:  [birth year]   DATE OF CONSULTATION:  02/18/2008  DATE OF DISCHARGE:                                 CONSULTATION   REASON FOR CONSULTATION:  Progressing dysphagia, abnormal barium pill  esophagram, rectal bleeding, weight loss.   HISTORY OF PRESENT ILLNESS:  [name] is a very pleasant 40 year-old Caucasian female from [place] admitted to the hospital  yesterday with a 2 month history of esophageal dysphagia to solids,  weight loss and profound microcytic anemia.  She states over the past 2  months she has had more and more difficulty swallowing solid food  although she has been able to get liquids down fairly well.  She has had  intermittent red blood per rectum on occasion with decreasing bowel  movement frequency over the past several weeks.  She denies melena.  She  has not had any hematemesis.   Barium pill esophagram demonstrated high-grade partial obstruction at  the EG junction, barium study suspicious for neoplasm.   She was admitted yesterday with a profound anemia of 6.4, H and H 6.4  and 22.1.  MCV 56.8.  She is hemodynamically stable and has received 2  units of blood overnight and this morning her H and H is 9.6 and 31.2.   This lady denies any prior gastrointestinal problems.  She denies  nonsteroidals.  She does have a 16 year history of cigarette smoking and  has consumed very little alcohol over her lifetime.   FAMILY HISTORY:  Significant with her one grandfather with  laryngeal/throat cancer.   At the time I saw her this morning she is quite comfortable and in no  acute distress.   Of note, prior to this past couple of months she has really been devoid  of any GI symptoms.  Specifically there is really nothing  in the history  to suggest gastroesophageal reflux disease.  There has been no  odynophagia, no early satiety prior to this recent illness.   PAST MEDICAL HISTORY:  Significant for migraine headaches, depression,  asthma.   PAST SURGICAL HISTORY:  Hysterectomy, cholecystectomy, tonsillectomy,  adenoidectomy, left ear surgery.   ADMISSION MEDICATIONS:  1. Celexa 40 mg daily.  2. Albuterol daily.   ALLERGIES:  PENICILLIN, LORCET.   SOCIAL HISTORY:  The patient is a Electrical engineer at [employer].  She  is divorced.  She has 2 grandchildren, one 73 year old son is in the  [employer] and has a 49 year old daughter.  A 16 year history of smoking  one half pack of cigarettes daily, rare alcohol consumption.  No history  of illicit drugs.   REVIEW OF SYSTEMS:  As in history of present illness.  No fever, night  sweats, chest pain, dyspnea on exertion.   PHYSICAL EXAMINATION:  GENERAL:  A very pleasant 37-year lady resting  comfortably.  VITAL SIGNS:  Temperature 99.2, pulse 101, respiratory  rate 20, BP  138/81.  SKIN:  Warm and dry.  There is no jaundice.  HEENT:  No scleral icterus.  Oral cavity no lesions.  CHEST:  Lungs are clear to auscultation.  CARDIOVASCULAR:  Regular rate and rhythm without murmur, gallop or rub.  BREASTS:  Deferred.  ABDOMEN:  Nondistended.  Positive bowel sounds.  Soft, nontender without  appreciable mass or organomegaly.  EXTREMITIES:  No edema.   ADDITIONAL LABORATORY DATA:  White count 7.78, H and H 9.6 and 31.2.  MCV 65.6.  Platelet count 383,000, sodium 141, potassium 3.7, chloride  108, CO2 27, glucose 121, BUN 5, creatinine is 0.72, calcium 9.1.   IMPRESSION:  [name] is a pleasant 40 year old female former smoker  admitted to the hospital with profound anemia, progressive esophageal  dysphagia and abnormal barium pill esophagram.  She also has  intermittent low volume hematochezia.  I have reviewed her barium pill  esophagram in the context  of the clinical setting.  I am indeed  concerned that this nice lady could have a neoplasm involving her distal  esophagus producing the majority of her symptoms.  Low volume  hematochezia would be a little bit unusual to fit with the clinical  scenario and may be related to a separate issue.  The rapid progression  of this process with the alarm features is not suggestive of a benign  process.  She has been a long term smoker which puts her at increased  risk for gastrointestinal neoplasia.   RECOMMENDATIONS:  She is feeling much better since she has been  transfused.  She has been tolerating clear liquids.  Therefore, I would  recommend we proceed with a diagnostic EGD on February 20, 2008, as an  urgent but not emergent procedure.  I have discussed risks, benefits,  alternatives and limitations of this approach with [name].  She is  agreeable.  Will advance her today to a clear liquid diet.  In fact, I  do not see any reason why she could not go home today and come back to  have her EGD as an outpatient but I will defer this approach to the  hospitalist team.   I would like to thank the hospitalist service and Dr. Sherwood Gambler for allowing  me to see this nice lady today.  Further recommendations to follow in  the very near future.      Jonathon Bellows, M.D.  Electronically Signed     RMR/MEDQ  D:  02/18/2008  T:  02/18/2008  Job:  [id number]   cc:   Madelin Rear. Sherwood Gambler, MD  Fax: [phone]

## 2010-12-09 NOTE — H&P (Signed)
NAME:  [name], [name] NO.:  [id number]   MEDICAL RECORD NO.:  [id number]          PATIENT TYPE:  EMS   LOCATION:  ED                            FACILITY:  [facility]   PHYSICIAN:  Skeet Latch, DO    DATE OF BIRTH:  1971-05-26   DATE OF ADMISSION:  08/15/2008  DATE OF DISCHARGE:  LH                              HISTORY & PHYSICAL   PRIMARY CARE PHYSICIAN:  Madelin Rear. Sherwood Gambler, MD   CHIEF COMPLAINT:  Shortness of breath.   HISTORY OF PRESENT ILLNESS:  This is a 40 year old female with a history  of depression, asthma, pancreatitis who presents with shortness of  breath and wheezing.  The patient states over the last few weeks she has  been having acute exacerbation of her asthma.  She has been on  antibiotics for approximately two weeks, given to her by her primary  care physician.  The patient states that she continues to cough and  continues to use breathing treatments at home without relief.  The  patient continued to see her primary care physician, has seen him 3-4  times over the last two weeks and was told today to come to the  emergency room for treatment.  The patient was seen at [facility] and admitted back in December for abdominal pain and was  discharged with pancreatitis.   PAST MEDICAL HISTORY:  1. Pancreatitis.  2. Esophageal stricture.  3. Anemia.  4. Hypertension.  5. Migraines.  6. Asthma.  7. Depression.   PAST SURGICAL HISTORY:  1. Cholecystectomy.  2. Hysterectomy.  3. Tonsillectomy.  4. Left ear repair.   ALLERGIES:  ASPIRIN, PENICILLIN, SULFA.   HOME MEDICATIONS:  1. Celexa 40 mg once a day.  2. Norvasc 2.5 mg daily.  3. Metoprolol 25 mg once daily.  4. Tizanidine 4 mg as needed.  5. Hydrocodone 5/500 mg as needed .  6. Temazepam 15 mg at bedtime.  7. Creon 25,000 units, specialized dosing.  8. She has been on Zithromax for the last two weeks.   SOCIAL HISTORY:  She lives in [place].  She has two grandchildren.  She has an 88 year old son, 42 year old daughter.  The patient states  that she has a history of half a pack of cigarettes a day.  She quit  approximately a year prior.  States that she is a social drinker.  Does  not drink regularly.  Denies any history of drug abuse.   FAMILY HISTORY:  Positive for laryngeal cancer.   REVIEW OF SYSTEMS:  Positive for shortness of breath and wheezing.  All  other symptoms are unremarkable.   PHYSICAL EXAMINATION:  VITAL SIGNS:  Temperature 97.0, respirations 26,  pulse 113, blood pressure 113/87.  GENERAL:  She is awake, alert and oriented x3 in no acute distress.  She  is well-nourished, well-developed, well-hydrated.  HEENT:  Normocephalic, atraumatic.  Eyes:  PERRLA.  EOMI.  NECK:  Soft, supple, nontender, nondistended.  RESPIRATORY:  She has bilateral wheezing, no rhonchi noted.  No rales.  CARDIOVASCULAR:  Tachycardic.  S1, S2 regular.  ABDOMEN:  Soft, nontender,  nondistended.  No rigidity or guarding.  EXTREMITIES:  No cyanosis, clubbing or edema.   RADIOLOGIC STUDIES:  Chest x-ray showed no acute infiltrate or edema.  Mild bilateral perihilar and infrahilar increased bronchial markings.  Findings were bronchitic changes or reactive airway disease.   LABORATORY DATA:  Pending at this time.   ASSESSMENT:  This is a 40 year old female with a history of asthma,  pancreatitis, hypertension, depression who presents with shortness of  breath and cough for a 2-3 week duration.  The patient sees a specialist  at [facility] for her pancreatitis, and is due for a procedure in the  coming weeks but presents with probable acute exacerbation of her  asthma.   PLAN:  1. The patient will be admitted to the service of [facility] to general      medical bed.  2. For her asthma, the patient does have pancreatitis, but the patient      probably needs IV steroids.  Will place the patient on IV steroids      and follow her liver function tests as well as her  lipase levels      very closely.  The patient will be placed on IV antibiotics,      antitussives and oxygen to keep her sats above or greater than 90%.  3. For her history of hypertension, the patient will be placed on her      home medications.  4. For history of depression and migraines, the patient will also be      placed on her home medications.  The patient will be placed on DVT      as well as GI prophylaxis.      Skeet Latch, DO  Electronically Signed     SM/MEDQ  D:  08/15/2008  T:  08/15/2008  Job:  [id number]   cc:   Madelin Rear. Sherwood Gambler, MD  Fax: [phone]

## 2010-12-09 NOTE — H&P (Signed)
NAME:  [name], [name]             ACCOUNT NO.:  [id number]   MEDICAL RECORD NO.:  [id number]          PATIENT TYPE:  AMB   LOCATION:  DAY                           FACILITY:  [facility]   PHYSICIAN:  R. Roetta Sessions, M.D. DATE OF BIRTH:  April 20, 1971   DATE OF ADMISSION:  DATE OF DISCHARGE:  LH                              HISTORY & PHYSICAL   CHIEF COMPLAINT:  Rectal bleeding and abdominal pain.   PHYSICIAN CO-SIGNING NOTE:  Jonathon Bellows, MD   HISTORY OF PRESENT ILLNESS:  Ms. [name] is here for followup.  She has  a complicated GI history as outlined below.  Most recently, she  continues to suffer with upper abdominal pain.  She has history of  chronic pancreatitis recently diagnosed at time of EUS on April 24, 2008.  She has ongoing abdominal pain.  Generally, the pain is anywhere  from 5-7/10 on a pain scale.  Pain radiates into her back.  She has some  nausea but no vomiting.  I started her on Creon 24,000 couple of weeks  ago.  She really has not noticed any improvement of these symptoms.  She  states, however, her swallowing has traumatically improved.  She denies  any heartburn and really had any.  She continues to have intermittent  bright red blood per rectum, although has not had any since last week.  Denies any black stools.  Generally, she noticed bleeding on the toilet  tissue.  She had this off and on for several months.  She has noticed  that she started feeling more fatigued and lightheaded when she stands  that she did before when her hemoglobin was severely low.  She is also  having increase in difficulty with her asthma which she generally does  in the fall.   CURRENT MEDICATIONS:  1. Metoprolol 25 mg daily.  2. Citalopram 40 mg daily.  3. Norvasc 2.5 mg daily.  4. Temazepam 15 mg nightly.  5. Tizanidine 4 mg p.r.n.  6. Prilosec 20 mg b.i.d.  7. Vicodin q.4-6 h. p.r.n.  8. Creon 24,000 two with meals, one with snacks.   PAST MEDICAL HISTORY:  History  of esophageal stricture.  EGD on February 20, 2008, revealed a high-grade what appeared to be a benign peptic  stricture status post partial dilation, salmon-colored epithelium in the  distal esophagus, irregular distal esophageal mucosa suspicious for  neoplasm, hiatal hernia.  Pathology was benign.  She underwent repeat  EGD on March 12, 2008, and had a relatively high grade short nail, but  what appeared to be benign peptic stricture status post dilation,  biopsies again taken and was benign and consistent with reflux  esophagitis.  No evidence of Barrett esophagus or tumor.  Her last EGD  biopsies on April 03, 2008, she had a short tight benign-appearing  peptic stricture, moderate-sized hiatal hernia.  Because she had a  recalcitrant stricture, she was sent over to [facility].  It was felt that she also need to have  endoscopic ultrasound, also to look for history of pancreatitis, which  was recently determined back in September as well.  CT on March 30, 2008, was without contrast due to lack of IV access did not show  anything along the pancreas.  She continued to have a distal esophageal  wall thickening and stricture.  Endoscopic ultrasound showed evidence of  chronic pancreatitis, esophagitis with associated strictures, biopsy  then dilated, large hiatal hernia with Cameron erosions.   She also has migraine headaches, depression, and asthma   PAST SURGICAL HISTORY:  Hysterectomy, cholecystectomy, tonsillectomy,  adenoidectomy and left ear surgery.   SOCIAL HISTORY:  She has two grandchildren, one 42 year old son in the  marines and 42 year old daughter.  She smokes half pack of cigarettes  daily.  Rare alcohol consumption.  No history of illicit drug use.   FAMILY HISTORY:  Grandfather had laryngeal/throat cancer.   REVIEW OF SYSTEMS:  See HPI for GI.  CONSTITUTIONAL:  No weight loss.  CARDIOPULMONARY:  History of asthma with increase  flares lately.  GENITOURINARY:  No dysuria or hematuria.   PHYSICAL EXAMINATION:  VITAL SIGNS:  Weight 188, temperature 98, blood  pressure 130/80, and pulse 92.  GENERAL:  Pleasant well-nourished, well-developed Caucasian female, in  no acute distress.  SKIN:  Warm and dry.  No jaundice.  HEENT: Sclerae nonicteric.  Oropharyngeal is moist and pink.  No  lesions, erythema, or exudate.  No lymphadenopathy or thyromegaly.  CHEST:  Lungs are clear to auscultation.  CARDIAC:  Regular rate and rhythm.  No murmurs, rubs, or gallops.  ABDOMEN:  Positive bowel sounds.  ABDOMEN: Soft.  She has mild-to-moderate tenderness in the epigastrium  to deep palpation.  No rebound or guarding.  No organomegaly or masses.  No abdominal bruits or hernias.  LOWER EXTREMITIES:  No edema.   IMPRESSION:  1. Progressive anemia which is microcytic.  Her hemoglobin in      September was 10.4, on May 01, 2008, it was 8.5, MCV 75.3.  Last      hemoglobin on May 08, 2008, stable at 8.5.  White count is      elevated at 15,100, platelet count normal.  She denies any melena.      She does have intermittent hematochezia.  I have discussed the      situation previously with Dr. Jena Gauss.  She could have chronic occult      bleeding from her esophagitis or Sheria Lang ulcers that we need to      evaluate her lower gastrointestinal tract as well.  Recommend      colonoscopy.  I discussed risks alternative and benefits with the      patient regarding colonoscopy and she is agreeable to proceed.  2. Esophageal stricture requiring multiple dilations in the past.  No      evidence of malignancy on endoscopic ultrasound.  Clinically,      asymptomatic at this time.  3. Chronic pancreatitis recently discovered that time of endoscopic      ultrasound.  On May 01, 2008, her lipase remained elevated at      266.  She is on Creon enzymes without results.  She is status post      cholecystectomy in the remote past over 18  years ago.  Her liver      function tests are normal.  Her triglyceride level is 199.  She      does not consume alcohol.  Etiology of her chronic pancreatitis      unclear.   PLAN:  1. Colonoscopy with  Dr. Jena Gauss in the near future.  We will recheck her      CBC and lipase at the time of the procedure.  2. Continue Creon 24,000 two with meals, one with snacks, #72 samples      provided as the patient is uninsured  3. We will arrange for followup at [facility] regarding chronic pancreatitis to help [place]      determine the etiology and management.  4. I have not scheduled the patient for repeat upper endoscopy given      that she is no longer having dysphagia.  If there is no source for      her anemia found at time of colonoscopy, Dr. Jena Gauss may opt to      repeat her EGD at that time.  Last EGD was at time of her EUS on      April 24, 2008, however.  5. We will discuss further with Dr. Jena Gauss, we may need to consider      repeat CT with hopes to getting IV access at this time and have      contrast study.  There was no complicating factor seen on EUS on      April 24, 2008.  However, she has had a slight increase in her      white blood cell count now up to 15,000.  Because of her      complicating factors such as pseudocyst formation, although they      did not report any acute pancreatitis on her recent EUS.  6. Further recommendations to follow.      Tana Coast, P.AJonathon Bellows, M.D.  Electronically Signed    LL/MEDQ  D:  05/14/2008  T:  05/15/2008  Job:  [id number]   cc:   Madelin Rear. Sherwood Gambler, MD  Fax: [phone]

## 2010-12-09 NOTE — Group Therapy Note (Signed)
NAMEMarland Kitchen  [name], [name] NO.:  [id number]   MEDICAL RECORD NO.:  [id number]          PATIENT TYPE:  INP   LOCATION:  A340                          FACILITY:  [facility]   PHYSICIAN:  Margaretmary Dys, M.D.DATE OF BIRTH:  1970-08-12   DATE OF PROCEDURE:  07/17/2008  DATE OF DISCHARGE:                                 PROGRESS NOTE   SUBJECTIVE:  Patient continues to feel better although her oral intake  remains very poor.  She has very minimal pain in her abdomen.  She does  have some nonspecific ileus on her abdominal x-ray yesterday.  Patient  is not vomiting but just has a poor appetite.   PHYSICAL EXAM:  Conscious although sick looking, acutely ill looking,  not in respiratory distress.  VITAL SIGNS:  Blood pressure is 139/70 with a pulse of 103.  Respirations 20.  Temperature 98.7 degrees Fahrenheit.  Oxygen  saturation was 95% on 3 L.  HEENT EXAM:  Normocephalic, atraumatic.  Oral mucosa was dry.  No  exudates were noted.  NECK:  Supple.  No JVD or lymphadenopathy.  LUNGS:  Reduced air entry bilaterally.  No crackles, wheezes, or  rhonchi.  ABDOMEN:  Soft, not tender, was not distended.  Bowel sounds positive.  EXTREMITIES:  No edema.  No calf induration or tenderness.  CNS EXAM:  Grossly intact.   LABORATORY/DIAGNOSTIC DATA:  White blood cell count is 9.6, hemoglobin  of 8.5, hematocrit 27.3, platelet count was 265 with 84% neutrophils.  Sodium 142, potassium 3.6, chloride of 112, CO2 was 30, glucose 136, BUN  of 2, creatinine was 0.53, AST is 46, ALT of 51, albumin was 2.9,  calcium is 8.3, lipase is 82, total cholesterol was 105, triglycerides  were 58.   ASSESSMENT AND PLAN:  1. Acute recurrent pancreatitis, etiology remains unclear.  2. History of hypertension.  3. Urinary tract infection.   PLAN:  1. We will decrease her IV fluid rate at this time as patient's lipase      and other parameters have improved.  2. Encouraged oral intake at this time.  3. We will also encourage ambulation.  4. We will hopefully discharge in the next 24 to 48 hours once her      oral intake improves and she has no abdominal pain and is able to      hold onto her food.  Patient is scheduled for a August 02, 2008,      appointment at [facility] [place].  Perhaps a further evaluation      including an ERCP.      Margaretmary Dys, M.D.  Electronically Signed     AM/MEDQ  D:  07/17/2008  T:  07/17/2008  Job:  [id number]

## 2010-12-09 NOTE — Op Note (Signed)
NAMEMarland [name]  [name], HEWINS             ACCOUNT NO.:  [id number]   MEDICAL RECORD NO.:  [id number]          PATIENT TYPE:  AMB   LOCATION:  DAY                           FACILITY:  [facility]   PHYSICIAN:  R. Roetta Sessions, M.D. DATE OF BIRTH:  10/27/1970   DATE OF PROCEDURE:  DATE OF DISCHARGE:                               OPERATIVE REPORT   Ileocolonoscopy diagnostic.   INDICATIONS FOR PROCEDURE:  A 40 year old lady with intermittent  hematochezia in the setting of significant iron deficiency anemia.  She  has a history of benign peptic stricture requiring multiple dilations,  most recent was evaluated over at [facility], had a dilation over there and  an EUS, this returned to be a benign stricture.  She is not having any  dysphagia currently.  Her last H and H was 9.4 and 30.7 on June 04, 2008.  Because for her hematochezia and iron deficiency anemia,  colonoscopy is now being done.  Risks, benefits, alternatives,  limitations have been reviewed, questions answered.  She is agreeable.  Of note, this lady has chronic pancreatitis, etiology uncertain on Creon  and Prilosec.  Further evaluation at [facility] is  pending.  In addition, she had a cold congestion, some wheezing, and  presented this morning with significant wheezing.  We held off doing the  procedure, went down to see Dr. Margretta Ditty in the ED and he gave her a  bronchodilator.  She is doing now much better, although she does have a  few expiratory wheezes on my exam.  Her O2 saturation is around 100%.   PROCEDURE NOTE:  O2 saturation, blood pressure, pulse, respirations were  monitored throughout the entire procedure.   CONSCIOUS SEDATION:  Versed 6 mg IV, Demerol 100 mg IV in divided doses.   INSTRUMENT:  Pentax video chip system.   FINDINGS:  Digital rectal exam revealed no abnormalities.  Endoscopic  findings:  Prep was good.  Colon:  Colonic mucosa was surveyed from the  rectosigmoid junction through  the left transverse, the right colon,  through the appendiceal orifice, ileocecal valve, and cecum.  These  structures were well seen, photographed for the record.  Terminal ileum  was intubated 10 cm.  From this level, the scope was slowly and  cautiously withdrawn.  All previously mentioned mucosal surfaces were  again seen.  The colonic mucosa appeared entirely normal, as did the  terminal ileal mucosa.  The scope was pulled down the rectum.  A  thorough examination of rectal mucosa including retroflexed view of the  anal verge and anoscopy of the anal canal demonstrated a friable anal  canal with some minimal bleeding and no significant hemorrhoids were  seen or other abnormality.  The patient tolerated the procedure well,  was reactive to endoscopy.   IMPRESSION:  Friable anal canal, otherwise normal rectum, colon, and  terminal ileum.   Today's findings are reassuring; however, my mind does not explain her  significant iron deficiency anemia requiring transfusion previously.  She may have an element of malabsorption given her chronic pancreatitis.   RECOMMENDATIONS:  1. Anusol-HC suppositories 1  per rectum at bedtime x10 days.  2. Go ahead and draw celiac antibody panel to cover that base as that      is not as of yet been done.  3. We will encourage Ms. Drennon to continue with her evaluation at      [facility] regarding chronic pancreatitis.  4. She has had an upper respiratory tract illness recently and I have      urged her to follow up Dr. Sherwood Gambler in the next of couple days if she      is not significantly improved.  5. Further recommendations to follow.      Jonathon Bellows, M.D.  Electronically Signed     RMR/MEDQ  D:  06/04/2008  T:  06/05/2008  Job:  [id number]   cc:   Madelin Rear. Sherwood Gambler, MD  Fax: [phone]

## 2010-12-09 NOTE — H&P (Signed)
NAMEMarland Kitchen  [name], [name]             ACCOUNT NO.:  [id number]   MEDICAL RECORD NO.:  [id number]          PATIENT TYPE:  INP   LOCATION:  A340                          FACILITY:  [facility]   PHYSICIAN:  Lonia Blood, M.D.      DATE OF BIRTH:  [birth year]   DATE OF ADMISSION:  07/13/2008  DATE OF DISCHARGE:  LH                              HISTORY & PHYSICAL   PRIMARY CARE PHYSICIANS:  1. Chelsea Rear. Sherwood Gambler, MD  2. Chelsea Bellows, MD   PRESENTING COMPLAINT:  Abdominal pain, nausea, and vomiting.   HISTORY OF PRESENT ILLNESS:  The patient is a 40 year old female with  history of asthma, depression, and prior pancreatitis that was status  post ileocolonoscopy by Dr. Jena Gauss on June 04, 2008, with no  significant findings at the time.  She came in today secondary to  abdominal pain, nausea, and vomiting.  The patient described the pain as  centrally located, going to the epigastric region, and going occasional  to other side.  It has been going on for the whole day.  She has vomited  at least 3 times, but no diarrhea, no fever, and no chills.  She has had  recurrent pancreatitis and has had multiple treatments and  hospitalizations in the past.  She has been referred to [facility] for further treatment of her pancreatitis, which she is  yet to go there.  She is status post cholecystectomy, and EGDs in the  past.  She denied any hematochezia.  No hematemesis.  No melena.   PAST MEDICAL HISTORY:  Significant for:  1. Esophageal stricture.  2. History of previous pancreatitis.  3. Severe anemia.  4. Hypertension.  5. Migraine headaches.  6. Asthma.  7. Status post cholecystectomy.  8. Left ear repair.   ALLERGIES:  She is allergic to ASPIRIN, PENICILLIN, and SULFA.   MEDICATIONS:  Include,  1. Celexa 40 mg daily.  2. Amlodipine 2.5 mg daily.  3. Metoprolol 25 mg daily.  4. Tizanidine 4 mg p.r.n.  5. Hydrocodone/acetaminophen 5/500 as needed.  6. Temazepam 15 mg  at bedtime.  7. Creon 25,000 units as needed.   SOCIAL HISTORY:  The patient lives in [place].  She has 2  grandchildren.  She has one 2 year old son, who is in the marines,  another 11 year old daughter.  She has history of smoking half a pack  per day of cigarettes.  Occasional alcohol, which she has not drank at  all in a long period of time.  Denied any IV drug use.   FAMILY HISTORY:  Significant for laryngeal cancer in her grandfather.   SURGICAL HISTORY:  The patient is status post hysterectomy and  cholecystectomy.   REVIEW OF SYSTEMS:  The patient complained of some yeast infection for  which she had used some over-the-counter medications.  Otherwise, 14-  point review of systems is per HPI.   PHYSICAL EXAMINATION:  VITAL SIGNS:  Temperature 98.3, blood pressure  169/94, pulse of 105, respiratory rate 16, and sats 95% on room air.  GENERAL:  The patient is awake, alert, and oriented  in mild distress due  to pain.  HEENT:  PERRL.  EOMI.  NECK:  Supple.  No JVD.  No lymphadenopathy.  RESPIRATORY:  She has good air entry bilaterally.  No wheezes, no rales.  CARDIOVASCULAR:  The patient has S1, S2.  No murmur.  ABDOMEN:  Soft with mild tenderness especially in the epigastric region.  EXTREMITIES:  No edema, cyanosis, or clubbing.   LABORATORY DATA:  Urinalysis showed trace leukocyte esterase, few  epithelial cells, wbc's 0-2, and rbc's 3-6 with small hemoglobin.  White  count is 17,200, hemoglobin 10.0, and platelet count 432.  With left  shift, ANC of 13.1 and MCV of 69.8.  Sodium 137, potassium 4.1, chloride  104, CO2 of 27, glucose 103, BUN 10, creatinine 0.62, calcium 9.0, total  protein 6.7, albumin 3.71.  LFTs essentially normal.  Her lipase is  1405.  She had a wet prep done that showed no yeast, no Trichomonas, but  she had clue cells, and wet prep wbc's also few of them.   ASSESSMENT:  1. Therefore, this is a 40 year old with recurrent pancreatitis       thought to be idiopathic, who has had extensive workup here in the      past and is currently undergoing workup also at [facility].  The      patient is here essentially with recurrent pancreatitis, which      seems to be with lipase of more than 1400.  Other findings include      hypertension, microcytic anemia, leukocytosis with left shift, etc.      The patient's pancreatitis by Ranson criteria is still in the      moderate state despite her lipase.  This could, however, change at      any moment, and she therefore will need very close monitoring.  She      is currently on tele, but we will not hesitate to transfer her to      Step-Down Unit or ICU if her condition deteriorates.  2. Hypertension:  Blood pressure seems to be okay.  We will keep the      patient n.p.o. for her pancreatitis, pain control, etc.  We will      control her blood pressures using IV medications as needed.  3. Bacterial vaginosis:  The patient will benefit from some Flagyl,      probably topical to avoid nausea and vomiting.  4. Possible urinary tract infection:  I will put the patient on some      antibiotics empirically both for her severe      pancreatitis as well as the urinary tract infection.  I will      probably use something like Primaxin to give effective coverage for      both.  At the same time, I will check a CT abdomen and pelvis.      Check a chest x-ray and follow the patient closely.      Lonia Blood, M.D.  Electronically Signed     LG/MEDQ  D:  07/13/2008  T:  07/14/2008  Job:  [id number]

## 2010-12-09 NOTE — Op Note (Signed)
NAMEMarland Kitchen  [name], [name]             ACCOUNT NO.:  [id number]   MEDICAL RECORD NO.:  [id number]          PATIENT TYPE:  INP   LOCATION:  A337                          FACILITY:  [facility]   PHYSICIAN:  R. Roetta Sessions, M.D. DATE OF BIRTH:  22-Apr-1971   DATE OF PROCEDURE:  02/20/2008  DATE OF DISCHARGE:                               OPERATIVE REPORT   PROCEDURE:  Esophagogastroduodenoscopy with TTS balloon dilation,  followed by biopsy.   INDICATIONS FOR PROCEDURE:  A 40 year old lady with a rapidly  progressing esophageal dysphagia to solids, significant weight loss,  admitted to the hospital with microcytic anemia with a hemoglobin in the  5 range, requiring transfusion.  A barium pill esophagram demonstrates a  high-grade obstruction, distal esophagus where the barium pill was  impeded.  EGD is now being done, potential for esophageal dilation  biopsy, etc., reviewed with [name] at length.  Risks, benefits,  alternatives, and limitations have been discussed.  All questions  answered.  All parties agreeable.   PROCEDURE NOTE:  O2 saturation, blood pressure, pulse and respiration  were monitored throughout the entire procedure.  Conscious sedation,  Versed 5 mg IV in divided doses.  Cetacaine spray for topical pharyngeal  anesthesia.   INSTRUMENT:  Pentax video chip system.   FINDINGS:  Examination of the tubular esophagus revealed salmon-colored  epithelium coming up in the distal tubular esophagus, which turned out  to be to 29 cm from the incisors at the GE junction, was found to be 33  cm from the incisors.  There appeared to be a high-grade peptic  stricture which would not admit the scope.  I was able to get a peek  down through the stricture and I could see gastric cavity down below.  Subsequently, I obtained a graduated TTS balloon, placed it across the  stricture and inflated it up to 12 mm, subsequently 13.5 mm at 4.5  atmospheres for 1 minute and took it down and now I  was able to get the  scope across the GE junction.  I went on ahead and carried out  examination of the stomach and duodenum stomach.  The was empty and  insufflated well with air.  A thorough examination of the gastric mucosa  including retroflexion of the proximal stomach, esophagogastric junction  demonstrated only a hiatal hernia.  Particular attention to the cardia  demonstrated no evidence of neoplasia from this perspective and gastric  mucosa appeared unremarkable.  Pylorus was patent and easily traversed.  Examination of the bulb  and second portion revealed no abnormalities.   Therapeutic/diagnostic maneuvers were performed.  Scope was pulled back  into the esophagus and the EG junction was confirmed to be at 33, salmon-  colored epithelium came up circumferentially, at the approximately 29 cm  from the incisors.  Just at the GE junction, however, there was  adenomatous appearing knobby mucosa which was suspicious appearing to  me.  It was biopsied separately.  Also, biopsies of the salmon-colored  epithelium were taken for histologic study.  The patient tolerated the  procedure well.  It is notable that the  aperture of the esophagus  through the stricture was much improved with TTS balloon dilation at the  conclusion of the procedure.  The patient tolerated the procedure well  and was reacted in Endoscopy.   IMPRESSION:  1. High-grade what appeared to be benign peptic stricture, status post      partial dilation as described above, salmon-colored epithelium      distal esophagus most likely representing Barrett's esophagus.  2. Irregular distal esophageal mucosa, suspicious for neoplasm      biopsied separately.  Hiatal hernia otherwise normal stomach, D1,      D2.   RECOMMENDATIONS:  1. PPI therapy b.i.d.  2. Full liquid diet.  3. Follow up on path.  4. If the path is negative for neoplasia, then I would bring this lady      back in 1 month for a repeat EGD dilation and  biopsies as      appropriate.      Jonathon Bellows, M.D.  Electronically Signed     RMR/MEDQ  D:  02/20/2008  T:  02/21/2008  Job:  [id number]   cc:   Hospitalist Team   Madelin Rear. Sherwood Gambler, MD  Fax: [phone]

## 2010-12-09 NOTE — Op Note (Signed)
NAMEMarland Kitchen  [name], [name]             ACCOUNT NO.:  [id number]   MEDICAL RECORD NO.:  [id number]          PATIENT TYPE:  AMB   LOCATION:  DAY                           FACILITY:  [facility]   PHYSICIAN:  [name], M.D. DATE OF BIRTH:  December 30, 1970   DATE OF PROCEDURE:  04/03/2008  DATE OF DISCHARGE:                               OPERATIVE REPORT   PROCEDURES:  Esophagogastroduodenoscopy with [name] dilation, followed  by biopsy.   INDICATIONS FOR PROCEDURE:  A 40 year old lady was recently in the  hospital with marked iron-deficiency anemia.  Barium esophagram  demonstrated high-grade stricture in distal esophagus.  She has been  having chronic dysphagia for some time.  She has been dilated on 2 prior  occasions in the recent past by me.  She has also been biopsied on 2  occasions.  There is no evidence of dysplasia or malignancy on histology  and endoscopically, this stricture appears to be a tight, recalcitrant  peptic stricture.  I just performed an EGD on October 17.  She had  relatively high-grade appearing, short benign-appearing peptic  stricture, which I dilated up with a 54-French Maloney dilator.  Biopsies again were taken, which came back benign inflammatory.  Dysphagia has rapidly recurred.  She has also had some vague abdominal  pain.  Recently, we did a lab work through the office.  Hemoglobin was  in the 10 range.  Her lipase was interestingly elevated at 300 range.  We ordered a CT of the abdomen with contrast.  However, apparently  radiologist had trouble getting an IV and did not call anybody and  elected to go ahead and do it noncontrast.  They saw some vague  thickening in the GE junction, but no obvious intra-abdominal visceral  pathology was seen, but the exam was again limited because of lack of  intravenous contrast.  On her CBC, her eosinophil count was slightly  elevated at 6%.  EGD is now being done to give [name] some relief.  She continues on Prilosec  20 mg orally twice daily.  The approach of EGD  with esophageal dilation is appropriate and has been reviewed.  Risks,  benefits, and alternatives have been discussed; questions are answered.  She is agreeable.  Please see the documentation in the medical record.   PROCEDURE NOTE:  O2 saturation, blood pressure, pulse, and respirations  were monitoredthroughout the entirety of the procedure.   CONSCIOUS SEDATION:  Versed 5 mg IV and Demerol 125 mg IV in divided  doses.  Cetacaine spray for topical pharyngeal anesthesia.   INSTRUMENTATION:  Pentax video chip system.   FINDINGS:  Examination of tubular esophagus again revealed a tight  benign-appearing peptic stricture, which initially would not admit the  scope; with some gentle pressure, I was able to pop through thisshort  stricture.  There was some localized overlying exudate, certainly  esophagus otherwise had a normal appearance, but proximally there was no  longitudinal folds or furrowing.  The obstruction was at the GE  junction.  Please see multiple photographs.  Stomach:  The gastric  cavity was emptied and insufflated well  with air.  Thorough examination  of the gastric mucosa including retroflexed proximal stomach,  esophagogastric junction demonstrated a moderate-sized hiatal hernia.  Mucosa of the GE junction retroflexed appeared entirely normal.  Gastric  mucosa appeared normal.  Pylorus was patent and easily traversed.  Examination of the bulb, second portion revealed no abnormalities.  Therapeutic/diagnostic maneuvers performed:  The scope was withdrawn.  A  54-French Maloney dilator was passed to full insertion with mild-to-  moderate resistance.  A look back revealed minimal amount of bleeding  but the stricture had been opened up similar to that seen at the last  setting.  Subsequently, biopsies of this inflamed area were taken for  histologic study.  The patient tolerated the procedure well and was  reacted in  Endoscopy.   IMPRESSION:  1. Short, tight, benign-appearing peptic stricture, status post      dilation and biopsy as described above.  2. Moderate-sized hiatal hernia, otherwise normal stomach D1 and D2.   [name] has apparent recalcitrant stricture.  It has been biopsied  multiple times and this appears to be benign to me again today; however,  I find this scenario in a 40 year old with a recalcitrant peptic  stricture to be somewhat unusual.  I think that she needs further  evaluation.  At a minimum, she may end up with a temporary esophageal  stent to help treat this situation; however, she might ought to go ahead  and have an endoscopic ultrasound/further evaluation at tertiary center  regarding this stricture.  As a separate issue, she has had some vague  abdominal pain and lipase in the 300 range.  Unfortunately, a  noncontrast CT did not give [place] any really useful additional information.   RECOMMENDATIONS:  Diet as tolerated.  Prilosec 20 mg orally twice daily.  We will get her an expedited referral over to [facility] to go ahead and further evaluate her abdominal pain and  elevated lipase.      [name], M.D.  Electronically Signed     RMR/MEDQ  D:  04/03/2008  T:  04/04/2008  Job:  [id number]   cc:   Chelsea Rear. Sherwood Gambler, MD  Fax: [phone]

## 2010-12-12 NOTE — Op Note (Signed)
NAME:  [name], [name] NO.:  [id number]   MEDICAL RECORD NO.:  [id number]          PATIENT TYPE:  AMB   LOCATION:  SDC                           FACILITY:  [facility]   PHYSICIAN:  Carrington Clamp, M.D. DATE OF BIRTH:  1970-11-01   DATE OF PROCEDURE:  09/01/2004  DATE OF DISCHARGE:                                 OPERATIVE REPORT   PREOPERATIVE DIAGNOSES:  1.  Dysmenorrhea.  2.  Ovarian lesion.   PREOPERATIVE DIAGNOSIS:  Endometriosis.   PROCEDURE:  Diagnostic laparoscopy with right ovarian cystectomy.   SURGEON:  Carrington Clamp, M.D.   ASSISTANT:  Luvenia Redden, M.D.   ANESTHESIA:  General.   SPECIMENS:  Right ovarian cyst, right ovarian biopsy, and fluid for  cytology.   ESTIMATED BLOOD LOSS:  Minimal.   IV FLUIDS:  1200 mL.   URINE OUTPUT:  50 mL.   COMPLICATIONS:  None.   FINDINGS:  A small amount of endometriosis on the left ovary.  There was  also a significant direct left inguinal hernia.  The right ovary had a small  cyst that was possibly a corpus luteum, but this was chosen to be removed.  There were also two small follicles, which were left intact, and there was a  small area on the ovary that was biopsied that could be the area that had  the calcifications in it and possibly the start of a small dermoid.   MEDICATIONS:  None.   COUNTS:  Correct x3.   TECHNIQUE:  After adequate general anesthesia was achieved, the patient was  prepped and draped in the usual sterile fashion in the dorsal lithotomy  position.  A speculum was placed in the vagina after a Foley had been placed  and a uterine manipulator had been placed inside the cervix.  Attention was  then turned to the abdomen, where a 2 cm infraumbilical incision was made  with the scalpel.  The Veress needle was passed into this without aspiration  of bowel contents or blood, and the abdomen was insufflated.  The 10 mm  trocar was placed and on inspection found to be in the abdominal  cavity, and  the bowel was close; however, after careful inspection around the scope, it  was noted that the trocar had just gone through the omentum and once  withdrawn, there was no evidence of any injury to any of the surrounding  structures or any adhesions that would have facilitated injury.  The abdomen  was carefully inspected systematically and the above findings noted.  Two 5  mm trocars were placed, one suprapubically, the other one to the right of  the round ligament on the right-hand side.  The tripolar cautery was  introduced and the ovarian cystectomy was proceeded with.  A biopsy of the  ovary was also undertaken on the right-hand side with good visualization of  the bowel, which was far away out of the field of dissection.  Hemostasis  was achieved with the triple-polar cautery too.  The tripolar cautery was  also used to cauterize the two areas of endometriosis on the left ovary.  There was no other further sign of endometriosis in the cul-de-sac or  anywhere else.  All areas were hemostatic.  The fluid was removed from the  pelvis with a 60 mL syringe and sent to cytology just in case.  The  instruments were then withdrawn from the abdomen, the abdomen desufflated,  and the trocars removed.  The 10 mm trocar site fascia was then closed with  the figure-of-eight stitch of 2-0 Vicryl, 3-0 Vicryl Rapide was used to  close the suprapubic skin incisions.  All incisions were then closed with  Dermabond.  The patient tolerated the procedure well and was returned to the  recovery room in stable condition.      MH/MEDQ  D:  09/01/2004  T:  09/01/2004  Job:  [id number]

## 2011-04-21 ENCOUNTER — Encounter: Payer: Self-pay | Admitting: *Deleted

## 2011-04-21 ENCOUNTER — Emergency Department (HOSPITAL_COMMUNITY): Payer: Self-pay

## 2011-04-21 ENCOUNTER — Inpatient Hospital Stay (HOSPITAL_COMMUNITY)
Admission: EM | Admit: 2011-04-21 | Discharge: 2011-04-24 | DRG: 440 | Disposition: A | Discharge status: Home / Self Care | Payer: Self-pay | Attending: Internal Medicine | Admitting: Internal Medicine

## 2011-04-21 DIAGNOSIS — M62838 Other muscle spasm: Secondary | ICD-10-CM | POA: Diagnosis present

## 2011-04-21 DIAGNOSIS — I10 Essential (primary) hypertension: Secondary | ICD-10-CM | POA: Diagnosis present

## 2011-04-21 DIAGNOSIS — K861 Other chronic pancreatitis: Secondary | ICD-10-CM | POA: Diagnosis present

## 2011-04-21 DIAGNOSIS — R109 Unspecified abdominal pain: Secondary | ICD-10-CM | POA: Diagnosis present

## 2011-04-21 DIAGNOSIS — H6090 Unspecified otitis externa, unspecified ear: Secondary | ICD-10-CM | POA: Diagnosis present

## 2011-04-21 DIAGNOSIS — E876 Hypokalemia: Secondary | ICD-10-CM | POA: Diagnosis not present

## 2011-04-21 DIAGNOSIS — R1084 Generalized abdominal pain: Secondary | ICD-10-CM | POA: Diagnosis present

## 2011-04-21 DIAGNOSIS — R112 Nausea with vomiting, unspecified: Secondary | ICD-10-CM | POA: Diagnosis present

## 2011-04-21 DIAGNOSIS — G43909 Migraine, unspecified, not intractable, without status migrainosus: Secondary | ICD-10-CM | POA: Diagnosis present

## 2011-04-21 DIAGNOSIS — K8500 Idiopathic acute pancreatitis without necrosis or infection: Secondary | ICD-10-CM | POA: Diagnosis present

## 2011-04-21 DIAGNOSIS — H60399 Other infective otitis externa, unspecified ear: Secondary | ICD-10-CM | POA: Diagnosis present

## 2011-04-21 DIAGNOSIS — R197 Diarrhea, unspecified: Secondary | ICD-10-CM | POA: Diagnosis present

## 2011-04-21 DIAGNOSIS — K859 Acute pancreatitis without necrosis or infection, unspecified: Principal | ICD-10-CM | POA: Diagnosis present

## 2011-04-21 HISTORY — DX: Depression, unspecified: F32.A

## 2011-04-21 HISTORY — DX: Other chronic pancreatitis: K86.1

## 2011-04-21 HISTORY — DX: Major depressive disorder, single episode, unspecified: F32.9

## 2011-04-21 LAB — COMPREHENSIVE METABOLIC PANEL
BUN: 12 mg/dL (ref 6–23)
CO2: 27 mEq/L (ref 19–32)
Chloride: 100 mEq/L (ref 96–112)
Creatinine, Ser: 0.62 mg/dL (ref 0.50–1.10)
GFR calc Af Amer: >60 mL/min (ref >60)
GFR calc non Af Amer: >60 mL/min (ref >60)
Total Bilirubin: 0.8 mg/dL (ref 0.3–1.2)
Total Protein: 6.9 g/dL (ref 6.0–8.3)

## 2011-04-21 LAB — DIFFERENTIAL
Eosinophils Relative: 4 % (ref 0–5)
Lymphocytes Relative: 25 % (ref 12–46)
Monocytes Absolute: 0.7 10*3/uL (ref 0.1–1.0)
Monocytes Relative: 5 % (ref 3–12)
Neutro Abs: 8.2 10*3/uL — ABNORMAL HIGH (ref 1.7–7.7)

## 2011-04-21 LAB — CBC
HCT: 43.2 % (ref 36.0–46.0)
Hemoglobin: 14.8 g/dL (ref 12.0–15.0)
MCHC: 34.3 g/dL (ref 30.0–36.0)
MCV: 92.9 fL (ref 78.0–100.0)
WBC: 12.6 10*3/uL — ABNORMAL HIGH (ref 4.0–10.5)

## 2011-04-21 LAB — URINALYSIS, ROUTINE W REFLEX MICROSCOPIC
Ketones, ur: NEGATIVE mg/dL
Leukocytes, UA: NEGATIVE
Nitrite: NEGATIVE
Protein, ur: NEGATIVE mg/dL
Urobilinogen, UA: 0.2 mg/dL (ref 0.0–1.0)

## 2011-04-21 LAB — LIPASE, BLOOD: Lipase: 209 U/L — ABNORMAL HIGH (ref 11–59)

## 2011-04-21 MED ORDER — ONDANSETRON HCL 4 MG/2ML IJ SOLN
4.0000 mg | Freq: Once | INTRAMUSCULAR | Status: AC
  Administered 2011-04-21: 4 mg via INTRAVENOUS
  Filled 2011-04-21: qty 2

## 2011-04-21 MED ORDER — HYDROMORPHONE HCL 1 MG/ML IJ SOLN
1.0000 mg | Freq: Once | INTRAMUSCULAR | Status: AC
  Administered 2011-04-21: 1 mg via INTRAVENOUS
  Filled 2011-04-21: qty 1

## 2011-04-21 MED ORDER — HYDROMORPHONE HCL 1 MG/ML IJ SOLN
1.0000 mg | Freq: Once | INTRAMUSCULAR | Status: AC
  Administered 2011-04-22: 1 mg via INTRAVENOUS
  Filled 2011-04-21: qty 1

## 2011-04-21 MED ORDER — SODIUM CHLORIDE 0.9 % IV SOLN
Freq: Once | INTRAVENOUS | Status: AC
  Administered 2011-04-21: 23:00:00 via INTRAVENOUS

## 2011-04-21 NOTE — ED Notes (Signed)
Pt reports rt sided flank pain radiating to rt side of upper abd x 2 days, also reports diarrhea and nausea

## 2011-04-21 NOTE — ED Provider Notes (Signed)
History    Scribed for Benny Lennert, MD, the patient was seen in room APA08/APA08. This chart was scribed by Katha Cabal. This patient's care was started at 22:24.     CSN: [id number] Arrival date & time: 04/21/2011 10:21 PM  Chief Complaint  Patient presents with  . Abdominal Pain    HPI  (Consider location/radiation/quality/duration/timing/severity/associated sxs/prior treatment)  HPI [name] is a 40 y.o. female who presents to the Emergency Department complaining of recurrent pancreatitis episode that began 2 days ago but got worse today around 2 PM.  Cause of recurrent episodes is unknown.  Pt c/o moderate epigastric abdominal pain that radiates to right flank with associated nausea and diarrhea.  Denies vomiting.  States that sx are typically for pancreatitis episodes.  Patient was last hospitalized 4 months ago in [facility] for similar sx.  Patient is to see gastroenterologist at [facility].  Patient had abdominal hysterectomy and cholecystectomy.    PAST MEDICAL HISTORY:  Past Medical History  Diagnosis Date  . Asthma   . Depression   . Migraine   . Pancreatitis chronic     PAST SURGICAL HISTORY:  Past Surgical History  Procedure Date  . Cholecystectomy   . Tonsillectomy   . Abdominal hysterectomy     FAMILY HISTORY:  No family history on file.   SOCIAL HISTORY: History   Social History  . Marital Status: Divorced    Spouse Name: N/A    Number of Children: N/A  . Years of Education: N/A   Social History Main Topics  . Smoking status: Current Everyday Smoker  . Smokeless tobacco: None  . Alcohol Use: Yes  . Drug Use:   . Sexually Active:    Other Topics Concern  . None   Social History Narrative  . None    Review of Systems  Review of Systems  Constitutional: Negative for fatigue.  HENT: Negative for congestion, sinus pressure and ear discharge.   Eyes: Negative for discharge.  Respiratory: Negative for cough.     Cardiovascular: Negative for chest pain.  Gastrointestinal: Positive for nausea, abdominal pain and diarrhea. Negative for vomiting.  Genitourinary: Positive for flank pain. Negative for frequency and hematuria.  Musculoskeletal: Negative for back pain.  Skin: Negative for rash.  Neurological: Negative for seizures and headaches.  Hematological: Negative.   Psychiatric/Behavioral: Negative for hallucinations.    Allergies  Penicillins and Sulfa antibiotics  Home Medications   Current Outpatient Rx  Name Route Sig Dispense Refill  . ALBUTEROL SULFATE (2.5 MG/3ML) 0.083% IN NEBU Nebulization Take 2.5 mg by nebulization every 6 (six) hours as needed. For shortness of breath     . AMLODIPINE BESYLATE 10 MG PO TABS Oral Take 10 mg by mouth daily.      Marland Kitchen CITALOPRAM HYDROBROMIDE 40 MG PO TABS Oral Take 40 mg by mouth daily.      Marland Kitchen CLONAZEPAM 2 MG PO TABS Oral Take 2 mg by mouth 3 (three) times daily as needed. anxiety    . DOCUSATE SODIUM 100 MG PO CAPS Oral Take 100 mg by mouth at bedtime.      Marland Kitchen HYDROCODONE-ACETAMINOPHEN 10-650 MG PO TABS Oral Take 1 tablet by mouth daily as needed. For pain     . OMEPRAZOLE 20 MG PO CPDR Oral Take 20 mg by mouth 3 (three) times daily.      . ALBUTEROL SULFATE HFA 108 (90 BASE) MCG/ACT IN AERS Inhalation Inhale 2 puffs into the lungs daily  as needed. For shortness of breath       Physical Exam    BP 123/73  Pulse 72  Temp(Src) 97.3 F (36.3 C) (Oral)  Resp 16  Ht 5\' 2"  (1.575 m)  Wt 165 lb (74.844 kg)  BMI 30.18 kg/m2  SpO2 98%  Physical Exam  Constitutional: She is oriented to person, place, and time. She appears well-developed.  HENT:  Head: Normocephalic and atraumatic.  Eyes: Conjunctivae and EOM are normal. No scleral icterus.  Neck: Neck supple. No thyromegaly present.  Cardiovascular: Normal rate and regular rhythm.  Exam reveals no gallop and no friction rub.   No murmur heard. Pulmonary/Chest: No stridor. She has no wheezes. She  has no rales. She exhibits no tenderness.  Abdominal: She exhibits no distension. There is tenderness. There is no rebound.       Epigastric tenderness   Musculoskeletal: Normal range of motion. She exhibits no edema.  Lymphadenopathy:    She has no cervical adenopathy.  Neurological: She is oriented to person, place, and time. Coordination normal.  Skin: No rash noted. No erythema.  Psychiatric: She has a normal mood and affect. Her behavior is normal.    ED Course  Procedures (including critical care time) OTHER DATA REVIEWED: Nursing notes, vital signs, and past medical records reviewed.   DIAGNOSTIC STUDIES: Oxygen Saturation is 98% on room air, normal by my interpretation.       LABS / RADIOLOGY:  Results for orders placed during the hospital encounter of 04/21/11  CBC      Component Value Range   WBC 12.6 (*) 4.0 - 10.5 (K/uL)   RBC 4.65  3.87 - 5.11 (MIL/uL)   Hemoglobin 14.8  12.0 - 15.0 (g/dL)   HCT 40.9  81.1 - 91.4 (%)   MCV 92.9  78.0 - 100.0 (fL)   MCH 31.8  26.0 - 34.0 (pg)   MCHC 34.3  30.0 - 36.0 (g/dL)   RDW 78.2  95.6 - 21.3 (%)   Platelets 276  150 - 400 (K/uL)  DIFFERENTIAL      Component Value Range   Neutrophils Relative 66  43 - 77 (%)   Neutro Abs 8.2 (*) 1.7 - 7.7 (K/uL)   Lymphocytes Relative 25  12 - 46 (%)   Lymphs Abs 3.1  0.7 - 4.0 (K/uL)   Monocytes Relative 5  3 - 12 (%)   Monocytes Absolute 0.7  0.1 - 1.0 (K/uL)   Eosinophils Relative 4  0 - 5 (%)   Eosinophils Absolute 0.5  0.0 - 0.7 (K/uL)   Basophils Relative 0  0 - 1 (%)   Basophils Absolute 0.0  0.0 - 0.1 (K/uL)  COMPREHENSIVE METABOLIC PANEL      Component Value Range   Sodium 138  135 - 145 (mEq/L)   Potassium 3.6  3.5 - 5.1 (mEq/L)   Chloride 100  96 - 112 (mEq/L)   CO2 27  19 - 32 (mEq/L)   Glucose, Bld 97  70 - 99 (mg/dL)   BUN 12  6 - 23 (mg/dL)   Creatinine, Ser 0.86  0.50 - 1.10 (mg/dL)   Calcium 9.2  8.4 - 57.8 (mg/dL)   Total Protein 6.9  6.0 - 8.3 (g/dL)    Albumin 4.3  3.5 - 5.2 (g/dL)   AST 19  0 - 37 (U/L)   ALT 31  0 - 35 (U/L)   Alkaline Phosphatase 89  39 - 117 (U/L)   Total Bilirubin 0.8  0.3 - 1.2 (mg/dL)   GFR calc non Af Amer >60  >60 (mL/min)   GFR calc Af Amer >60  >60 (mL/min)  LIPASE, BLOOD      Component Value Range   Lipase 209 (*) 11 - 59 (U/L)  URINALYSIS, ROUTINE W REFLEX MICROSCOPIC      Component Value Range   Color, Urine YELLOW  YELLOW    Appearance CLEAR  CLEAR    Specific Gravity, Urine >1.030 (*) 1.005 - 1.030    pH 6.0  5.0 - 8.0    Glucose, UA NEGATIVE  NEGATIVE (mg/dL)   Hgb urine dipstick NEGATIVE  NEGATIVE    Bilirubin Urine NEGATIVE  NEGATIVE    Ketones, ur NEGATIVE  NEGATIVE (mg/dL)   Protein, ur NEGATIVE  NEGATIVE (mg/dL)   Urobilinogen, UA 0.2  0.0 - 1.0 (mg/dL)   Nitrite NEGATIVE  NEGATIVE    Leukocytes, UA NEGATIVE  NEGATIVE     No results found.    ED COURSE / COORDINATION OF CARE: 10:32 PM  Physical exam complete.  Will order IV fluids, labs and pain control.   11:58 PM Will review labs.  Waiting on DG Abdomen with chest .  12:12 AM  EDMD interpreted DG ABD with Chest. (3 views)  No acute abdomen.  Will discuss results with patient.  Plan to consult Hospitalist [place] regarding admission.       Orders Placed This Encounter  Procedures  . DG Abd Acute W/Chest  . CBC  . Differential  . Comprehensive metabolic panel  . Lipase, blood  . Urinalysis, Routine w reflex microscopic    MDM: pancreatits   IMPRESSION: Diagnoses that have been ruled out:  Diagnoses that are still under consideration:  Final diagnoses:     MEDICATIONS GIVEN IN THE E.D. Scheduled Meds:    . sodium chloride   Intravenous Once  . HYDROmorphone  1 mg Intravenous Once  .  HYDROmorphone (DILAUDID) injection  1 mg Intravenous Once  . ondansetron  4 mg Intravenous Once   Continuous Infusions:     DISCHARGE MEDICATIONS: New Prescriptions   No medications on file     The chart was scribed  for me under my direct supervision.  I personally performed the history, physical, and medical decision making and all procedures in the evaluation of this patient..  Please click refresh button to pull xray results in chart.            Benny Lennert, MD 04/22/11 [phone]

## 2011-04-21 NOTE — ED Notes (Signed)
Pt requesting pain medication at this time. EDP notified. 

## 2011-04-22 ENCOUNTER — Inpatient Hospital Stay (HOSPITAL_COMMUNITY): Payer: Self-pay

## 2011-04-22 ENCOUNTER — Encounter (HOSPITAL_COMMUNITY): Payer: Self-pay | Admitting: *Deleted

## 2011-04-22 LAB — CBC
HCT: 39.2 % (ref 36.0–46.0)
MCHC: 33.9 g/dL (ref 30.0–36.0)
RBC: 4.18 MIL/uL (ref 3.87–5.11)
RDW: 11.6 % (ref 11.5–15.5)

## 2011-04-22 LAB — COMPREHENSIVE METABOLIC PANEL
Alkaline Phosphatase: 76 U/L (ref 39–117)
BUN: 12 mg/dL (ref 6–23)
CO2: 27 mEq/L (ref 19–32)
Calcium: 8.3 mg/dL — ABNORMAL LOW (ref 8.4–10.5)
Chloride: 104 mEq/L (ref 96–112)
Creatinine, Ser: 0.59 mg/dL (ref 0.50–1.10)
GFR calc Af Amer: >60 mL/min (ref >60)
GFR calc non Af Amer: >60 mL/min (ref >60)
Glucose, Bld: 104 mg/dL — ABNORMAL HIGH (ref 70–99)
Potassium: 3.4 mEq/L — ABNORMAL LOW (ref 3.5–5.1)
Total Bilirubin: 0.9 mg/dL (ref 0.3–1.2)

## 2011-04-22 LAB — TSH: TSH: 2.687 u[IU]/mL (ref 0.350–4.500)

## 2011-04-22 LAB — CHOLESTEROL, TOTAL: Cholesterol: 158 mg/dL (ref 0–200)

## 2011-04-22 MED ORDER — IOHEXOL 300 MG/ML  SOLN
100.0000 mL | Freq: Once | INTRAMUSCULAR | Status: AC | PRN
  Administered 2011-04-22: 100 mL via INTRAVENOUS

## 2011-04-22 MED ORDER — CITALOPRAM HYDROBROMIDE 20 MG PO TABS
40.0000 mg | ORAL_TABLET | Freq: Every day | ORAL | Status: DC
  Administered 2011-04-22: 40 mg via ORAL
  Administered 2011-04-23: 20 mg via ORAL
  Administered 2011-04-24: 40 mg via ORAL
  Filled 2011-04-22 (×3): qty 2

## 2011-04-22 MED ORDER — HYDROCODONE-ACETAMINOPHEN 10-325 MG PO TABS
1.0000 | ORAL_TABLET | Freq: Four times a day (QID) | ORAL | Status: DC | PRN
  Administered 2011-04-22 – 2011-04-24 (×6): 1 via ORAL
  Filled 2011-04-22 (×6): qty 1

## 2011-04-22 MED ORDER — ALBUTEROL SULFATE (5 MG/ML) 0.5% IN NEBU: 2.5000 mg | INHALATION_SOLUTION | RESPIRATORY_TRACT | Status: DC | PRN

## 2011-04-22 MED ORDER — SODIUM CHLORIDE 0.9 % IJ SOLN
INTRAMUSCULAR | Status: AC
  Administered 2011-04-22: 10:00:00
  Filled 2011-04-22: qty 10

## 2011-04-22 MED ORDER — POTASSIUM CHLORIDE CRYS ER 20 MEQ PO TBCR
40.0000 meq | EXTENDED_RELEASE_TABLET | ORAL | Status: AC
  Administered 2011-04-22 (×2): 40 meq via ORAL
  Filled 2011-04-22 (×2): qty 2

## 2011-04-22 MED ORDER — CIPROFLOXACIN-DEXAMETHASONE 0.3-0.1 % OT SUSP
4.0000 [drp] | Freq: Two times a day (BID) | OTIC | Status: DC
  Administered 2011-04-22 – 2011-04-24 (×5): 4 [drp] via OTIC
  Filled 2011-04-22: qty 7.5

## 2011-04-22 MED ORDER — ALBUTEROL SULFATE HFA 108 (90 BASE) MCG/ACT IN AERS: 2.0000 | INHALATION_SPRAY | RESPIRATORY_TRACT | Status: DC | PRN

## 2011-04-22 MED ORDER — ACETAMINOPHEN 650 MG RE SUPP: 650.0000 mg | Freq: Four times a day (QID) | RECTAL | Status: DC | PRN

## 2011-04-22 MED ORDER — SODIUM CHLORIDE 0.9 % IV SOLN
INTRAVENOUS | Status: DC
  Administered 2011-04-22: 125 mL via INTRAVENOUS
  Administered 2011-04-22 – 2011-04-23 (×4): via INTRAVENOUS

## 2011-04-22 MED ORDER — CLONAZEPAM 0.5 MG PO TABS
2.0000 mg | ORAL_TABLET | Freq: Three times a day (TID) | ORAL | Status: DC | PRN
Start: 2011-04-22 — End: 2011-04-24
  Administered 2011-04-23 (×2): 2 mg via ORAL
  Filled 2011-04-22 (×2): qty 4

## 2011-04-22 MED ORDER — ONDANSETRON HCL 4 MG PO TABS: 4.0000 mg | ORAL_TABLET | Freq: Four times a day (QID) | ORAL | Status: DC | PRN

## 2011-04-22 MED ORDER — ALBUTEROL SULFATE (2.5 MG/3ML) 0.083% IN NEBU
2.5000 mg | INHALATION_SOLUTION | Freq: Four times a day (QID) | RESPIRATORY_TRACT | Status: DC | PRN
  Administered 2011-04-22 (×2): 2.5 mg via RESPIRATORY_TRACT
  Filled 2011-04-22 (×3): qty 0.5

## 2011-04-22 MED ORDER — MORPHINE SULFATE 2 MG/ML IJ SOLN
1.0000 mg | INTRAMUSCULAR | Status: DC | PRN
  Administered 2011-04-22 (×3): 1 mg via INTRAVENOUS
  Filled 2011-04-22 (×3): qty 1

## 2011-04-22 MED ORDER — ACETAMINOPHEN 325 MG PO TABS
650.0000 mg | ORAL_TABLET | Freq: Four times a day (QID) | ORAL | Status: DC | PRN
  Administered 2011-04-24: 650 mg via ORAL
  Filled 2011-04-22: qty 2

## 2011-04-22 MED ORDER — PANCRELIPASE (LIP-PROT-AMYL) 12000-38000 UNITS PO CPEP
1.0000 | ORAL_CAPSULE | Freq: Three times a day (TID) | ORAL | Status: DC
  Administered 2011-04-22: 1 via ORAL
  Administered 2011-04-23: 11:00:00 via ORAL
  Administered 2011-04-23: 1 via ORAL
  Administered 2011-04-23: 17:00:00 via ORAL
  Administered 2011-04-24 (×2): 1 via ORAL
  Filled 2011-04-22 (×6): qty 1

## 2011-04-22 MED ORDER — MORPHINE SULFATE 2 MG/ML IJ SOLN
4.0000 mg | INTRAMUSCULAR | Status: DC | PRN
  Administered 2011-04-22 – 2011-04-24 (×11): 4 mg via INTRAVENOUS
  Filled 2011-04-22 (×12): qty 2

## 2011-04-22 MED ORDER — AMLODIPINE BESYLATE 5 MG PO TABS
10.0000 mg | ORAL_TABLET | Freq: Every day | ORAL | Status: DC
  Administered 2011-04-22 – 2011-04-24 (×3): 10 mg via ORAL
  Filled 2011-04-22 (×3): qty 2

## 2011-04-22 MED ORDER — ONDANSETRON HCL 4 MG/2ML IJ SOLN
4.0000 mg | Freq: Four times a day (QID) | INTRAMUSCULAR | Status: DC | PRN
  Administered 2011-04-22 – 2011-04-24 (×8): 4 mg via INTRAVENOUS
  Filled 2011-04-22 (×8): qty 2

## 2011-04-22 NOTE — Progress Notes (Signed)
Subjective: Patient complaining of abdominal pain, also having mild headaches and left ear pain.  Objective: Vital signs in last 24 hours: Temp:  [97.3 F (36.3 C)-97.8 F (36.6 C)] 97.8 F (36.6 C) (09/26 1191) Pulse Rate:  [72-94] 85  (09/26 0623) Resp:  [16-20] 20  (09/26 0623) BP: (97-129)/(67-84) 106/72 mmHg (09/26 1000) SpO2:  [93 %-99 %] 93 % (09/26 1141) Weight:  [74.3 kg (163 lb 12.8 oz)-74.844 kg (165 lb)] 163 lb 12.8 oz (74.3 kg) (09/26 0144) Weight change:  Last BM Date: 04/20/11  Intake/Output from previous day:   Total I/O In: 1198.3 [P.O.:240; I.V.:958.3] Out: -    Physical Exam: General: Alert, awake, oriented x3, in no acute distress. HEENT: No bruits, no goiter. Left ear pain; otoscope examination demonstrating mild a erythema of mid canal w/o suppuration, pain when pulling tragus. Heart: Regular rate and rhythm, without murmurs, rubs, gallops. Lungs: Clear to auscultation bilaterally. Abdomen: tenderness to palpation on epigastric area, positive BS, no guarding. Extremities: No clubbing cyanosis or edema with positive pedal pulses. Neuro: Grossly intact, nonfocal.    Lab Results: Basic Metabolic Panel:  Basename 04/22/11 0558 04/21/11 2242  NA 138 138  K 3.4* 3.6  CL 104 100  CO2 27 27  GLUCOSE 104* 97  BUN 12 12  CREATININE 0.59 0.62  CALCIUM 8.3* 9.2  MG -- --  PHOS -- --   Liver Function Tests:  [facility] 04/22/11 0558 04/21/11 2242  AST 19 19  ALT 27 31  ALKPHOS 76 89  BILITOT 0.9 0.8  PROT 6.1 6.9  ALBUMIN 3.5 4.3    Basename 04/22/11 0604 04/21/11 2242  LIPASE 222* 209*  AMYLASE -- --   CBC:  Basename 04/22/11 0558 04/21/11 2242  WBC 10.0 12.6*  NEUTROABS -- 8.2*  HGB 13.3 14.8  HCT 39.2 43.2  MCV 93.8 92.9  PLT 256 276   Fasting Lipid Panel:  Basename 04/22/11 0559  CHOL 158  HDL --  LDLCALC --  TRIG --  CHOLHDL --  LDLDIRECT --    Recent Results (from the past 240 hour(s))  MRSA PCR SCREENING     Status:  Normal   Collection Time   04/22/11  6:15 AM      Component Value Range Status Comment   MRSA by PCR NEGATIVE  NEGATIVE  Final     Studies/Results: Ct Abdomen Pelvis W Contrast  04/22/2011  *RADIOLOGY REPORT*  Clinical Data: Abdominal pain  CT ABDOMEN AND PELVIS WITH CONTRAST  Technique:  Multidetector CT imaging of the abdomen and pelvis was performed following the standard protocol during bolus administration of intravenous contrast.  Contrast: OMNIPAQUE IOHEXOL 300 MG/ML IV SOLN  Comparison: CT 06/18/2010  Findings: Lung bases are clear.  No pericardial fluid.  Moderate hiatal hernia is noted.  No focal hepatic lesion.  There is fatty infiltration along the falciform ligament.  Prior cholecystectomy.  There is mild fat stranding and inflammation around the pancreatic head.  This is increased compared to prior.  The body and tail appear normal.  No fluid collections are present.  No common bile duct dilatation.  The spleen, adrenal glands, and kidneys are normal.  The stomach, small bowel, and colon normal.  Abdominal aorta normal caliber.  No retroperitoneal lymphadenopathy.  The bladder is normal.  Post hysterectomy. Ovaries are normal. Review of  bone windows demonstrates no aggressive osseous lesions.  IMPRESSION:  1.  Inflammation surrounding the head of the pancreas suggests focal mild pancreatitis.  2. Hepatic  steatosis  3.  Cholecystectomy  Original Report Authenticated By: Genevive Bi, M.D.   Dg Abd Acute W/chest  04/22/2011  *RADIOLOGY REPORT*  Clinical Data: Upper abdominal pain, nausea vomiting and diarrhea.  ACUTE ABDOMEN SERIES (ABDOMEN 2 VIEW & CHEST 1 VIEW)  Comparison: Acute abdominal series 04/04/2010 and abdomen pelvis CT 06/18/2010  Findings: There is a small hiatal hernia contour.  The heart size is within normal limits.  Pulmonary vascularity is normal and the lungs are clear.  There is no pleural effusion.  No evidence of free air.  Surgical clips are seen in the  expected location of the gallbladder fossa.  Two additional surgical clips are seen more inferior and lateral to the gallbladder fossa.  The bowel gas pattern is nonobstructive.  No radiopaque urinary tract calculus is seen.  No acute or suspicious bony abnormality.  IMPRESSION: 1.  Nonobstructive bowel gas pattern. 2.  Small hiatal hernia. 3.  No acute cardiopulmonary disease.  Original Report Authenticated By: Britta Mccreedy, M.D.    Medications: Scheduled Meds:   . sodium chloride   Intravenous Once  . amLODipine  10 mg Oral Daily  . ciprofloxacin-dexamethasone  4 drop Left Ear BID  . citalopram  40 mg Oral Daily  . HYDROmorphone  1 mg Intravenous Once  .  HYDROmorphone (DILAUDID) injection  1 mg Intravenous Once  . lipase/protease/amylase  1 capsule Oral TID WC  . ondansetron  4 mg Intravenous Once  . potassium chloride  40 mEq Oral Q4H  . sodium chloride       Continuous Infusions:   . sodium chloride 125 mL/hr at 04/22/11 0657   PRN Meds:.acetaminophen, acetaminophen, albuterol, albuterol, clonazePAM, HYDROcodone-acetaminophen, iohexol, morphine, ondansetron (ZOFRAN) IV, ondansetron, DISCONTD: morphine  Assessment/Plan:  Principal Problem:  1-PANCREATITIS - CT scan ruling out any abscess, and demonstrating inflammation around pancreas head; which cofirmed diagnosis of pancreatitis. We'll continue fluid resuscitation, will start creon and will continue antiemetics and pain medications. 2- HTN- stable continue cold medications 3- Left ear pain- findings consistent with acute otitis externa, we'll start treatment with Ciprodex for 7 days. 4- Diarrhea- will check stools for C. Diff infection; patient currently on isolation. Patient diarrhea colds basic leading to her chronic pancreatitis space in the middle presented any fevers or any other signs of infection at this point; will start creon. 5- Migraines- currently no complaints of any headaches,  continue when necessary pain  medications. 6-Asthma- continue when necessary inhalers.  7- Hypokalemia- secondary to volume contraction and diarrhea, we'll replete potassium.   LOS: 1 day   [name] 04/22/2011, 12:49 PM

## 2011-04-22 NOTE — ED Notes (Signed)
Pt resting calmly w/ eyes closed. Rise & fall of the chest noted. Bed in low position, side rails up x2. NAD noted at this time.  

## 2011-04-22 NOTE — H&P (Signed)
[name] is an 40 y.o. female.   Chief Complaint: Abdominal pain HPI: 41 year old female with H/O chronic pancreatitis cause not known presents with epigastric pain radiating to her back for last four days with nausea vomiting and diarrhea. In the ER patient was found to have elevated lipase and has been admitted for pancreatitis. In addition patient has headache bifrontal. Patient four months ago had celiac plexus block for her chronic pain in [place].  Past Medical History  Diagnosis Date  . Asthma   . Depression   . Migraine   . Pancreatitis chronic     Past Surgical History  Procedure Date  . Cholecystectomy   . Tonsillectomy   . Abdominal hysterectomy     Family History  Problem Relation Age of Onset  . Asthma Other   . Coronary artery disease Neg Hx    Social History:  reports that she has been smoking Cigarettes.  She has a 15 pack-year smoking history. She uses smokeless tobacco. She reports that she does not drink alcohol or use illicit drugs.  Allergies:  Allergies  Allergen Reactions  . Penicillins     REACTION: Unknown reaction  . Sulfa Antibiotics     Medications Prior to Admission  Medication Dose Route Frequency Provider Last Rate Last Dose  . 0.9 %  sodium chloride infusion   Intravenous Once Benny Lennert, MD 125 mL/hr at 04/21/11 2257    . HYDROmorphone (DILAUDID) injection 1 mg  1 mg Intravenous Once Benny Lennert, MD   1 mg at 04/21/11 2300  . HYDROmorphone (DILAUDID) injection 1 mg  1 mg Intravenous Once Theoren Palka N. Jamilla Galli   1 mg at 04/22/11 0009  . ondansetron (ZOFRAN) injection 4 mg  4 mg Intravenous Once Benny Lennert, MD   4 mg at 04/21/11 2258   No current outpatient prescriptions on file as of 04/22/2011.    Results for orders placed during the hospital encounter of 04/21/11 (from the past 48 hour(s))  CBC     Status: Abnormal   Collection Time   04/21/11 10:42 PM      Component Value Range Comment   WBC 12.6 (*) 4.0 - 10.5  (K/uL)    RBC 4.65  3.87 - 5.11 (MIL/uL)    Hemoglobin 14.8  12.0 - 15.0 (g/dL)    HCT 95.6  21.3 - 08.6 (%)    MCV 92.9  78.0 - 100.0 (fL)    MCH 31.8  26.0 - 34.0 (pg)    MCHC 34.3  30.0 - 36.0 (g/dL)    RDW 57.8  46.9 - 62.9 (%)    Platelets 276  150 - 400 (K/uL)   DIFFERENTIAL     Status: Abnormal   Collection Time   04/21/11 10:42 PM      Component Value Range Comment   Neutrophils Relative 66  43 - 77 (%)    Neutro Abs 8.2 (*) 1.7 - 7.7 (K/uL)    Lymphocytes Relative 25  12 - 46 (%)    Lymphs Abs 3.1  0.7 - 4.0 (K/uL)    Monocytes Relative 5  3 - 12 (%)    Monocytes Absolute 0.7  0.1 - 1.0 (K/uL)    Eosinophils Relative 4  0 - 5 (%)    Eosinophils Absolute 0.5  0.0 - 0.7 (K/uL)    Basophils Relative 0  0 - 1 (%)    Basophils Absolute 0.0  0.0 - 0.1 (K/uL)   COMPREHENSIVE METABOLIC  PANEL     Status: Normal   Collection Time   04/21/11 10:42 PM      Component Value Range Comment   Sodium 138  135 - 145 (mEq/L)    Potassium 3.6  3.5 - 5.1 (mEq/L)    Chloride 100  96 - 112 (mEq/L)    CO2 27  19 - 32 (mEq/L)    Glucose, Bld 97  70 - 99 (mg/dL)    BUN 12  6 - 23 (mg/dL)    Creatinine, Ser 1.61  0.50 - 1.10 (mg/dL)    Calcium 9.2  8.4 - 10.5 (mg/dL)    Total Protein 6.9  6.0 - 8.3 (g/dL)    Albumin 4.3  3.5 - 5.2 (g/dL)    AST 19  0 - 37 (U/L)    ALT 31  0 - 35 (U/L)    Alkaline Phosphatase 89  39 - 117 (U/L)    Total Bilirubin 0.8  0.3 - 1.2 (mg/dL)    GFR calc non Af Amer >60  >60 (mL/min)    GFR calc Af Amer >60  >60 (mL/min)   LIPASE, BLOOD     Status: Abnormal   Collection Time   04/21/11 10:42 PM      Component Value Range Comment   Lipase 209 (*) 11 - 59 (U/L)   URINALYSIS, ROUTINE W REFLEX MICROSCOPIC     Status: Abnormal   Collection Time   04/21/11 11:00 PM      Component Value Range Comment   Color, Urine YELLOW  YELLOW     Appearance CLEAR  CLEAR     Specific Gravity, Urine >1.030 (*) 1.005 - 1.030     pH 6.0  5.0 - 8.0     Glucose, UA NEGATIVE  NEGATIVE  (mg/dL)    Hgb urine dipstick NEGATIVE  NEGATIVE     Bilirubin Urine NEGATIVE  NEGATIVE     Ketones, ur NEGATIVE  NEGATIVE (mg/dL)    Protein, ur NEGATIVE  NEGATIVE (mg/dL)    Urobilinogen, UA 0.2  0.0 - 1.0 (mg/dL)    Nitrite NEGATIVE  NEGATIVE     Leukocytes, UA NEGATIVE  NEGATIVE  MICROSCOPIC NOT DONE ON URINES WITH NEGATIVE PROTEIN, BLOOD, LEUKOCYTES, NITRITE, OR GLUCOSE <1000 mg/dL.   Dg Abd Acute W/chest  04/22/2011  *RADIOLOGY REPORT*  Clinical Data: Upper abdominal pain, nausea vomiting and diarrhea.  ACUTE ABDOMEN SERIES (ABDOMEN 2 VIEW & CHEST 1 VIEW)  Comparison: Acute abdominal series 04/04/2010 and abdomen pelvis CT 06/18/2010  Findings: There is a small hiatal hernia contour.  The heart size is within normal limits.  Pulmonary vascularity is normal and the lungs are clear.  There is no pleural effusion.  No evidence of free air.  Surgical clips are seen in the expected location of the gallbladder fossa.  Two additional surgical clips are seen more inferior and lateral to the gallbladder fossa.  The bowel gas pattern is nonobstructive.  No radiopaque urinary tract calculus is seen.  No acute or suspicious bony abnormality.  IMPRESSION: 1.  Nonobstructive bowel gas pattern. 2.  Small hiatal hernia. 3.  No acute cardiopulmonary disease.  Original Report Authenticated By: Britta Mccreedy, M.D.    Review of Systems  Constitutional: Negative.   Eyes: Negative.   Respiratory: Negative.   Cardiovascular: Negative.   Gastrointestinal: Positive for nausea, vomiting, abdominal pain and diarrhea.  Genitourinary: Negative.   Skin: Negative.   Neurological: Positive for headaches.  Endo/Heme/Allergies: Negative.   Psychiatric/Behavioral: Negative.  Blood pressure 126/84, pulse 94, temperature 97.7 F (36.5 C), temperature source Oral, resp. rate 20, height 5\' 2"  (1.575 m), weight 74.3 kg (163 lb 12.8 oz), SpO2 93.00%. Physical Exam  Constitutional: She is oriented to person, place, and  time. She appears well-developed and well-nourished.  HENT:  Head: Normocephalic and atraumatic.  Eyes: Conjunctivae and EOM are normal. Pupils are equal, round, and reactive to light.  Neck: Normal range of motion. Neck supple.  Cardiovascular: Normal rate and regular rhythm.   Respiratory: Effort normal and breath sounds normal.  GI: There is tenderness.  Musculoskeletal: Normal range of motion.  Neurological: She is alert and oriented to person, place, and time. She has normal reflexes.  Skin: Skin is warm and dry.     Assessment/Plan 1)Pancreatitis acute on chronic 2)Headache with H/O MIGRAINES 3)HTN 4)Tobacco abuse  Plan: Admit under [facility] For her abdominal pain with increased Lipase is probably from pancreatitis will get CT Abdomen and Pelvis with contrast. Place patient on iv fluids patient relief meds and repeat labs in AM.   Emarie Paul N. 04/22/2011, 2:28 AM

## 2011-04-23 LAB — CBC
HCT: 37.2 % (ref 36.0–46.0)
MCH: 31.4 pg (ref 26.0–34.0)
MCHC: 33.1 g/dL (ref 30.0–36.0)
MCV: 94.9 fL (ref 78.0–100.0)
RDW: 11.5 % (ref 11.5–15.5)
WBC: 8.4 10*3/uL (ref 4.0–10.5)

## 2011-04-23 LAB — BASIC METABOLIC PANEL
BUN: 4 mg/dL — ABNORMAL LOW (ref 6–23)
Calcium: 8.2 mg/dL — ABNORMAL LOW (ref 8.4–10.5)
Chloride: 106 mEq/L (ref 96–112)
Creatinine, Ser: 0.49 mg/dL — ABNORMAL LOW (ref 0.50–1.10)
GFR calc Af Amer: >60 mL/min (ref >60)
Glucose, Bld: 100 mg/dL — ABNORMAL HIGH (ref 70–99)
Potassium: 4 mEq/L (ref 3.5–5.1)

## 2011-04-23 MED ORDER — METHOCARBAMOL 500 MG PO TABS
500.0000 mg | ORAL_TABLET | Freq: Three times a day (TID) | ORAL | Status: DC | PRN
  Administered 2011-04-23 – 2011-04-24 (×3): 500 mg via ORAL
  Filled 2011-04-23 (×3): qty 1

## 2011-04-23 MED ORDER — NICOTINE 14 MG/24HR TD PT24
14.0000 mg | MEDICATED_PATCH | Freq: Every day | TRANSDERMAL | Status: DC
  Administered 2011-04-23 – 2011-04-24 (×2): 14 mg via TRANSDERMAL
  Filled 2011-04-23 (×2): qty 1

## 2011-04-23 NOTE — Progress Notes (Signed)
Subjective: Patient is still complaining of mild abdominal pain; but improved overall in comparison to pain on admission. Patient willing to try regular diet. No fever, no nausea and no vomiting. Also complaining of mild neck muscle spasms.  Objective: Vital signs in last 24 hours: Temp:  [97.3 F (36.3 C)-98 F (36.7 C)] 97.3 F (36.3 C) (09/27 0700) Pulse Rate:  [77-88] 88  (09/27 0700) Resp:  [16-19] 18  (09/27 0700) BP: (95-119)/(60-85) 119/85 mmHg (09/27 0700) SpO2:  [92 %-95 %] 93 % (09/27 0700) Weight change:  Last BM Date: 04/20/11  Intake/Output from previous day: 09/26 0701 - 09/27 0700 In: 4185.5 [P.O.:960; I.V.:3217.5; IV Piggyback:8] Out: 300 [Urine:300] Total I/O In: 240 [P.O.:240] Out: -    Physical Exam: General: Alert, awake, oriented x3, in no acute distress. Heart: Regular rate and rhythm, without murmurs, rubs, gallops. Lungs: Clear to auscultation bilaterally. Abdomen: mild tenderness to palpation on epigastric area, positive BS, no guarding and no rebound. Extremities: No clubbing cyanosis or edema with positive pedal pulses. Neuro: Grossly intact, nonfocal.    Lab Results: Basic Metabolic Panel:  Basename 04/23/11 0400 04/22/11 0558  NA 138 138  K 4.0 3.4*  CL 106 104  CO2 27 27  GLUCOSE 100* 104*  BUN 4* 12  CREATININE 0.49* 0.59  CALCIUM 8.2* 8.3*  MG -- --  PHOS -- --   Liver Function Tests:  [facility] 04/22/11 0558 04/21/11 2242  AST 19 19  ALT 27 31  ALKPHOS 76 89  BILITOT 0.9 0.8  PROT 6.1 6.9  ALBUMIN 3.5 4.3    Basename 04/22/11 0604 04/21/11 2242  LIPASE 222* 209*  AMYLASE -- --   CBC:  Basename 04/23/11 0400 04/22/11 0558 04/21/11 2242  WBC 8.4 10.0 --  NEUTROABS -- -- 8.2*  HGB 12.3 13.3 --  HCT 37.2 39.2 --  MCV 94.9 93.8 --  PLT 202 256 --   Fasting Lipid Panel:  Basename 04/22/11 0559  CHOL 158  HDL --  LDLCALC --  TRIG --  CHOLHDL --  LDLDIRECT --    Recent Results (from the past 240 hour(s))    MRSA PCR SCREENING     Status: Normal   Collection Time   04/22/11  6:15 AM      Component Value Range Status Comment   MRSA by PCR NEGATIVE  NEGATIVE  Final     Studies/Results: Ct Abdomen Pelvis W Contrast  04/22/2011  *RADIOLOGY REPORT*  Clinical Data: Abdominal pain  CT ABDOMEN AND PELVIS WITH CONTRAST  Technique:  Multidetector CT imaging of the abdomen and pelvis was performed following the standard protocol during bolus administration of intravenous contrast.  Contrast: OMNIPAQUE IOHEXOL 300 MG/ML IV SOLN  Comparison: CT 06/18/2010  Findings: Lung bases are clear.  No pericardial fluid.  Moderate hiatal hernia is noted.  No focal hepatic lesion.  There is fatty infiltration along the falciform ligament.  Prior cholecystectomy.  There is mild fat stranding and inflammation around the pancreatic head.  This is increased compared to prior.  The body and tail appear normal.  No fluid collections are present.  No common bile duct dilatation.  The spleen, adrenal glands, and kidneys are normal.  The stomach, small bowel, and colon normal.  Abdominal aorta normal caliber.  No retroperitoneal lymphadenopathy.  The bladder is normal.  Post hysterectomy. Ovaries are normal. Review of  bone windows demonstrates no aggressive osseous lesions.  IMPRESSION:  1.  Inflammation surrounding the head of the pancreas suggests  focal mild pancreatitis.  2. Hepatic steatosis  3.  Cholecystectomy  Original Report Authenticated By: Genevive Bi, M.D.   Dg Abd Acute W/chest  04/22/2011  *RADIOLOGY REPORT*  Clinical Data: Upper abdominal pain, nausea vomiting and diarrhea.  ACUTE ABDOMEN SERIES (ABDOMEN 2 VIEW & CHEST 1 VIEW)  Comparison: Acute abdominal series 04/04/2010 and abdomen pelvis CT 06/18/2010  Findings: There is a small hiatal hernia contour.  The heart size is within normal limits.  Pulmonary vascularity is normal and the lungs are clear.  There is no pleural effusion.  No evidence of free air.   Surgical clips are seen in the expected location of the gallbladder fossa.  Two additional surgical clips are seen more inferior and lateral to the gallbladder fossa.  The bowel gas pattern is nonobstructive.  No radiopaque urinary tract calculus is seen.  No acute or suspicious bony abnormality.  IMPRESSION: 1.  Nonobstructive bowel gas pattern. 2.  Small hiatal hernia. 3.  No acute cardiopulmonary disease.  Original Report Authenticated By: Britta Mccreedy, M.D.    Medications: Scheduled Meds:    . amLODipine  10 mg Oral Daily  . ciprofloxacin-dexamethasone  4 drop Left Ear BID  . citalopram  40 mg Oral Daily  . lipase/protease/amylase  1 capsule Oral TID WC  . potassium chloride  40 mEq Oral Q4H   Continuous Infusions:    . sodium chloride 125 mL/hr at 04/23/11 0110   PRN Meds:.acetaminophen, acetaminophen, albuterol, albuterol, clonazePAM, HYDROcodone-acetaminophen, iohexol, methocarbamol, morphine, ondansetron (ZOFRAN) IV, ondansetron, DISCONTD: albuterol, DISCONTD: morphine  Assessment/Plan:  Principal Problem: 1-PANCREATITIS - better and I did explain asking for diet to be advanced, will advance her diet as tolerated, continue creon and adjust IVF's rate. 2- HTN- stable continue current medications 3- Left ear pain- continue ABX's as instructed. 4- Diarrhea- C. Diff by PCR negative. Isolation has been removed and at this point her diarrhea most likely due to her chronic pancreatitis, will continue Creon. 5- Migraines- currently no complaints of any headaches,  continue PRN meds. 6-Asthma- continue when necessary inhalers.  7- Hypokalemia- Resolved. 8-Muscle spasm- will start tx with robaxin prn.  Rest of patient's medical problems are stable and the plan is to continue same medication's regimen.   LOS: 2 days   [name] 04/23/2011, 10:44 AM

## 2011-04-24 DIAGNOSIS — R112 Nausea with vomiting, unspecified: Secondary | ICD-10-CM | POA: Diagnosis present

## 2011-04-24 DIAGNOSIS — H6090 Unspecified otitis externa, unspecified ear: Secondary | ICD-10-CM | POA: Diagnosis present

## 2011-04-24 DIAGNOSIS — M62838 Other muscle spasm: Secondary | ICD-10-CM | POA: Diagnosis present

## 2011-04-24 DIAGNOSIS — K8500 Idiopathic acute pancreatitis without necrosis or infection: Secondary | ICD-10-CM | POA: Diagnosis present

## 2011-04-24 LAB — CBC
HCT: 37.3 % (ref 36.0–46.0)
HCT: 22.1 — ABNORMAL LOW
HCT: 29.4 — ABNORMAL LOW
HCT: 29.9 — ABNORMAL LOW
HCT: 31.2 — ABNORMAL LOW
Hemoglobin: 12.6 g/dL (ref 12.0–15.0)
Hemoglobin: 9 — ABNORMAL LOW
Hemoglobin: 9.1 — ABNORMAL LOW
MCH: 32 pg (ref 26.0–34.0)
MCHC: 33.8 g/dL (ref 30.0–36.0)
MCHC: 29.1 — ABNORMAL LOW
MCHC: 30.4
MCHC: 30.8
MCV: 94.7 fL (ref 78.0–100.0)
MCV: 65.5 — ABNORMAL LOW
MCV: 65.6 — ABNORMAL LOW
Platelets: 351
RBC: 4.47
RBC: 4.56
RBC: 4.75
RDW: 11.4 % — ABNORMAL LOW (ref 11.5–15.5)
RDW: 19.8 — ABNORMAL HIGH
RDW: 29.5 — ABNORMAL HIGH
RDW: 29.8 — ABNORMAL HIGH
WBC: 9.1 10*3/uL (ref 4.0–10.5)
WBC: 7.4
WBC: 7.7
WBC: 7.8

## 2011-04-24 LAB — BASIC METABOLIC PANEL
BUN: 5 mg/dL — ABNORMAL LOW (ref 6–23)
CO2: 26
CO2: 27
CO2: 27
Calcium: 8.7
Chloride: 105
Chloride: 108
Chloride: 110
Creatinine, Ser: 0.6 mg/dL (ref 0.50–1.10)
Creatinine, Ser: 0.68
Creatinine, Ser: 0.72
GFR calc Af Amer: >60 mL/min (ref >60)
GFR calc Af Amer: >60
GFR calc Af Amer: >60
GFR calc Af Amer: >60
GFR calc non Af Amer: >60 mL/min (ref >60)
GFR calc non Af Amer: >60
Glucose, Bld: 104 mg/dL — ABNORMAL HIGH (ref 70–99)
Glucose, Bld: 101 — ABNORMAL HIGH
Glucose, Bld: 108 — ABNORMAL HIGH
Potassium: 4 mEq/L (ref 3.5–5.1)
Potassium: 3.6
Potassium: 3.7
Potassium: 3.8
Sodium: 136

## 2011-04-24 LAB — HEMOGLOBIN AND HEMATOCRIT, BLOOD
HCT: 22.2 — ABNORMAL LOW
HCT: 28.4 — ABNORMAL LOW
Hemoglobin: 6.4 — CL
Hemoglobin: 8.6 — ABNORMAL LOW
Hemoglobin: 9.4 — ABNORMAL LOW

## 2011-04-24 LAB — CROSSMATCH: ABO/RH(D): A POS

## 2011-04-24 LAB — DIFFERENTIAL
Basophils Absolute: 0
Basophils Absolute: 0
Basophils Relative: 0
Basophils Relative: 0
Basophils Relative: 1
Basophils Relative: 1
Eosinophils Relative: 2
Eosinophils Relative: 6 — ABNORMAL HIGH
Eosinophils Relative: 6 — ABNORMAL HIGH
Lymphocytes Relative: 22
Lymphs Abs: 1.7
Lymphs Abs: 2.1
Monocytes Absolute: 0.7
Monocytes Absolute: 0.7
Monocytes Relative: 8
Monocytes Relative: 9
Monocytes Relative: 9
Neutro Abs: 4.1
Neutro Abs: 4.9
Neutro Abs: 5.1
Neutrophils Relative %: 61
Neutrophils Relative %: 63

## 2011-04-24 LAB — RETICULOCYTES
RBC.: 3.87
Retic Ct Pct: 2.3

## 2011-04-24 LAB — ABO/RH: ABO/RH(D): A POS

## 2011-04-24 LAB — FERRITIN: Ferritin: 1 — ABNORMAL LOW (ref 10–291)

## 2011-04-24 LAB — FOLATE: Folate: 13.6

## 2011-04-24 LAB — IRON AND TIBC: UIBC: 401

## 2011-04-24 MED ORDER — METHOCARBAMOL 500 MG PO TABS: 500.0000 mg | ORAL_TABLET | Freq: Three times a day (TID) | ORAL | Status: AC | PRN

## 2011-04-24 MED ORDER — CIPROFLOXACIN-DEXAMETHASONE 0.3-0.1 % OT SUSP: 4.0000 [drp] | Freq: Two times a day (BID) | OTIC | Status: AC

## 2011-04-24 MED ORDER — NICOTINE 14 MG/24HR TD PT24: 1.0000 | MEDICATED_PATCH | Freq: Every day | TRANSDERMAL | Status: AC

## 2011-04-24 MED ORDER — PANCRELIPASE (LIP-PROT-AMYL) 12000-38000 UNITS PO CPEP: 1.0000 | ORAL_CAPSULE | Freq: Three times a day (TID) | ORAL | Status: DC

## 2011-04-24 NOTE — Discharge Summary (Signed)
Physician Discharge Summary  Patient ID: [name] MRN: [id number] DOB/AGE: June 02, 1971 40 y.o.  Admit date: 04/21/2011 Discharge date: 04/24/2011  Primary Care Physician:  Cassell Smiles., MD   Discharge Diagnoses:    Present on Admission:  .PANCREATITIS .MIGRAINE HEADACHE .HYPERTENSION .Diarrhea .NAUSEA AND VOMITING .ABDOMINAL PAIN .Muscle spasm .Otitis externa .Pancreatitis chronic .Nausea & vomiting   Current Discharge Medication List    START taking these medications   Details  ciprofloxacin-dexamethasone (CIPRODEX) otic suspension Place 4 drops into the left ear 2 (two) times daily. Qty: 7.5 mL, Refills: 0    lipase/protease/amylase (CREON-10/PANCREASE) 12000 UNITS CPEP Take 1 capsule by mouth 3 (three) times daily with meals. Qty: 270 capsule, Refills: 2    methocarbamol (ROBAXIN) 500 MG tablet Take 1 tablet (500 mg total) by mouth every 8 (eight) hours as needed. Qty: 45 tablet, Refills: 0    nicotine (NICODERM CQ - DOSED IN MG/24 HOURS) 14 mg/24hr patch Place 1 patch onto the skin daily. Qty: 28 patch, Refills: 1      CONTINUE these medications which have NOT CHANGED   Details  albuterol (PROVENTIL) (2.5 MG/3ML) 0.083% nebulizer solution Take 2.5 mg by nebulization every 6 (six) hours as needed. For shortness of breath     amLODipine (NORVASC) 10 MG tablet Take 10 mg by mouth daily.      citalopram (CELEXA) 40 MG tablet Take 40 mg by mouth daily.      clonazePAM (KLONOPIN) 2 MG tablet Take 2 mg by mouth 3 (three) times daily as needed. anxiety    docusate sodium (COLACE) 100 MG capsule Take 100 mg by mouth at bedtime.      HYDROcodone-acetaminophen (LORCET) 10-650 MG per tablet Take 1 tablet by mouth daily as needed. For pain     omeprazole (PRILOSEC) 20 MG capsule Take 20 mg by mouth 3 (three) times daily.      albuterol (PROVENTIL HFA;VENTOLIN HFA) 108 (90 BASE) MCG/ACT inhaler Inhale 2 puffs into the lungs daily as needed. For shortness of  breath          Disposition and Follow-up:  Patient has been discharged in stable and improved condition, currently not complaining of any nausea vomiting, tolerating by mouth's and weights shows minimal discomfort in her epigastric area which is chronic. Patient has been instructed to follow a low fat diet, to take her medication as prescribed and to arrange a followup visit with primary care physician over the next 7 days. Followup visit will be important to readdress chronic pancreatitis, compliant with medications and to repeat BMET to follow on her electrolytes.  Consults:   no consultations were made   Significant Diagnostic Studies:  Ct Abdomen Pelvis W Contrast  04/22/2011  *RADIOLOGY REPORT*  Clinical Data: Abdominal pain  CT ABDOMEN AND PELVIS WITH CONTRAST  Technique:  Multidetector CT imaging of the abdomen and pelvis was performed following the standard protocol during bolus administration of intravenous contrast.  Contrast: OMNIPAQUE IOHEXOL 300 MG/ML IV SOLN  Comparison: CT 06/18/2010  Findings: Lung bases are clear.  No pericardial fluid.  Moderate hiatal hernia is noted.  No focal hepatic lesion.  There is fatty infiltration along the falciform ligament.  Prior cholecystectomy.  There is mild fat stranding and inflammation around the pancreatic head.  This is increased compared to prior.  The body and tail appear normal.  No fluid collections are present.  No common bile duct dilatation.  The spleen, adrenal glands, and kidneys are normal.  The stomach, small  bowel, and colon normal.  Abdominal aorta normal caliber.  No retroperitoneal lymphadenopathy.  The bladder is normal.  Post hysterectomy. Ovaries are normal. Review of  bone windows demonstrates no aggressive osseous lesions.  IMPRESSION:  1.  Inflammation surrounding the head of the pancreas suggests focal mild pancreatitis.  2. Hepatic steatosis  3.  Cholecystectomy  Original Report Authenticated By: Genevive Bi,  M.D.   Dg Abd Acute W/chest  04/22/2011  *RADIOLOGY REPORT*  Clinical Data: Upper abdominal pain, nausea vomiting and diarrhea.  ACUTE ABDOMEN SERIES (ABDOMEN 2 VIEW & CHEST 1 VIEW)  Comparison: Acute abdominal series 04/04/2010 and abdomen pelvis CT 06/18/2010  Findings: There is a small hiatal hernia contour.  The heart size is within normal limits.  Pulmonary vascularity is normal and the lungs are clear.  There is no pleural effusion.  No evidence of free air.  Surgical clips are seen in the expected location of the gallbladder fossa.  Two additional surgical clips are seen more inferior and lateral to the gallbladder fossa.  The bowel gas pattern is nonobstructive.  No radiopaque urinary tract calculus is seen.  No acute or suspicious bony abnormality.  IMPRESSION: 1.  Nonobstructive bowel gas pattern. 2.  Small hiatal hernia. 3.  No acute cardiopulmonary disease.  Original Report Authenticated By: Britta Mccreedy, M.D.    Brief H and P: For complete details please refer to admission H and P, but in brief 34-year-old female with past medical history of chronic pancreatitis with multiple previous admissions secondary to exacerbation, who came into the hospital complaining of acute on chronic epigastric pain with associated nausea and vomiting. In the ED patient was found to have elevated lipase level and a CT scan of her abdomen rule out any abscess, but  demonstrated focal inflammation in the head of her pancreas; [facility] was called to admit the patient for further evaluation and treatment.    Hospital Course:  1-Pancreatitis chronic- chronic, patient was admitted for bowel rest, fluid resuscitation, and IV pain medications. She responds successfully to the treatment and by the moment of discharge she was able to tolerate full diet and able to take her chronic pain medications by mouth. Patient was started on Creon and will follow with PCP in 7 days. 2- MIGRAINE HEADACHE- stable during this  hospitalization patient, will continue using chronic pain medication as needed 3-HYPERTENSION- continue home medications. Blood pressure was stable during admission. 4- Diarrhea- secondary to chronic pancreatitis; significantly improved at discharge, patient has been started on Creon. 5- ABDOMINAL PAIN- back to baseline; she will continue using her chronic pain medications. 6- Muscle spasm- Continue PRN robaxin. 7- Otitis externa- improved with the use of ciprodex; she will continue and finish treatment as indicated. 8-Nausea & vomiting- improve, all secondary to acute on chronic pancreatitis; at discharge no having any nausea vomiting and able to tolerate a full diet and medications by mouth.   Time spent on Discharge:  40 minutes    Signed: Cameryn Chrisley 04/24/2011, 2:39 PM

## 2011-04-24 NOTE — Progress Notes (Signed)
Pt discharged home via family; Pt and family given and explained all discharge instructions, carenotes, and prescriptions; pt and family stated understanding and denied questions/concerns; all f/u appointments in place; IV removed without complicaitons; pt stable at time of discharge  

## 2011-04-28 LAB — DIFFERENTIAL
Basophils Relative: 0
Lymphs Abs: 1.6
Monocytes Absolute: 0.7
Monocytes Relative: 6
Neutro Abs: 7.5
Neutrophils Relative %: 69

## 2011-04-28 LAB — IGA: IgA: 318

## 2011-04-28 LAB — GLIADIN ANTIBODIES, SERUM
Gliadin IgA: 1.6 U/mL (ref <7)
Gliadin IgG: 4.9 U/mL (ref <7)

## 2011-04-28 LAB — CBC
Hemoglobin: 9.4 — ABNORMAL LOW
MCV: 70.3 — ABNORMAL LOW
RBC: 4.37
WBC: 10.9 — ABNORMAL HIGH

## 2011-04-28 LAB — TISSUE TRANSGLUTAMINASE, IGA: Tissue Transglutaminase Ab, IgA: 0.5 U/mL (ref <7)

## 2011-04-28 LAB — LIPASE, BLOOD: Lipase: 205 — ABNORMAL HIGH

## 2011-04-28 LAB — RETICULIN AB, IGA: Reticulin Antibody, IgA: NEGATIVE

## 2011-05-01 LAB — URINALYSIS, ROUTINE W REFLEX MICROSCOPIC
Nitrite: NEGATIVE
Protein, ur: NEGATIVE mg/dL
Specific Gravity, Urine: 1.015 (ref 1.005–1.030)
Urobilinogen, UA: 0.2 mg/dL (ref 0.0–1.0)

## 2011-05-01 LAB — CBC
HCT: 24.5 % — ABNORMAL LOW (ref 36.0–46.0)
HCT: 27.3 % — ABNORMAL LOW (ref 36.0–46.0)
HCT: 28 % — ABNORMAL LOW (ref 36.0–46.0)
Hemoglobin: 7.6 g/dL — CL (ref 12.0–15.0)
Hemoglobin: 8.5 g/dL — ABNORMAL LOW (ref 12.0–15.0)
Hemoglobin: 8.6 g/dL — ABNORMAL LOW (ref 12.0–15.0)
MCHC: 31 g/dL (ref 30.0–36.0)
MCHC: 31 g/dL (ref 30.0–36.0)
MCHC: 31.2 g/dL (ref 30.0–36.0)
MCHC: 31.3 g/dL (ref 30.0–36.0)
MCHC: 31.3 g/dL (ref 30.0–36.0)
MCHC: 31.3 g/dL (ref 30.0–36.0)
MCV: 69.8 fL — ABNORMAL LOW (ref 78.0–100.0)
MCV: 70.1 fL — ABNORMAL LOW (ref 78.0–100.0)
MCV: 73.2 fL — ABNORMAL LOW (ref 78.0–100.0)
MCV: 73.8 fL — ABNORMAL LOW (ref 78.0–100.0)
Platelets: 265 10*3/uL (ref 150–400)
Platelets: 277 10*3/uL (ref 150–400)
Platelets: 327 10*3/uL (ref 150–400)
Platelets: 432 10*3/uL — ABNORMAL HIGH (ref 150–400)
RBC: 3.51 MIL/uL — ABNORMAL LOW (ref 3.87–5.11)
RBC: 3.7 MIL/uL — ABNORMAL LOW (ref 3.87–5.11)
RBC: 3.75 MIL/uL — ABNORMAL LOW (ref 3.87–5.11)
RBC: 3.99 MIL/uL (ref 3.87–5.11)
RBC: 4.59 MIL/uL (ref 3.87–5.11)
RDW: 17.8 % — ABNORMAL HIGH (ref 11.5–15.5)
RDW: 17.9 % — ABNORMAL HIGH (ref 11.5–15.5)
RDW: 18.1 % — ABNORMAL HIGH (ref 11.5–15.5)
RDW: 19.5 % — ABNORMAL HIGH (ref 11.5–15.5)
RDW: 19.6 % — ABNORMAL HIGH (ref 11.5–15.5)
RDW: 20.4 % — ABNORMAL HIGH (ref 11.5–15.5)
WBC: 9.6 10*3/uL (ref 4.0–10.5)

## 2011-05-01 LAB — BASIC METABOLIC PANEL
BUN: 5 mg/dL — ABNORMAL LOW (ref 6–23)
CO2: 24 mEq/L (ref 19–32)
Calcium: 7.9 mg/dL — ABNORMAL LOW (ref 8.4–10.5)
Creatinine, Ser: 0.6 mg/dL (ref 0.4–1.2)
GFR calc non Af Amer: >60 mL/min (ref >60)
Glucose, Bld: 127 mg/dL — ABNORMAL HIGH (ref 70–99)
Potassium: 3.5 mEq/L (ref 3.5–5.1)

## 2011-05-01 LAB — DIFFERENTIAL
Band Neutrophils: 0 % (ref 0–10)
Basophils Absolute: 0 10*3/uL (ref 0.0–0.1)
Basophils Absolute: 0 10*3/uL (ref 0.0–0.1)
Basophils Absolute: 0 10*3/uL (ref 0.0–0.1)
Basophils Absolute: 0 10*3/uL (ref 0.0–0.1)
Basophils Relative: 0 % (ref 0–1)
Basophils Relative: 0 % (ref 0–1)
Basophils Relative: 0 % (ref 0–1)
Basophils Relative: 0 % (ref 0–1)
Basophils Relative: 0 % (ref 0–1)
Eosinophils Absolute: 0.1 10*3/uL (ref 0.0–0.7)
Eosinophils Absolute: 0.1 10*3/uL (ref 0.0–0.7)
Eosinophils Absolute: 0.2 10*3/uL (ref 0.0–0.7)
Eosinophils Absolute: 0.3 10*3/uL (ref 0.0–0.7)
Eosinophils Relative: 1 % (ref 0–5)
Eosinophils Relative: 3 % (ref 0–5)
Eosinophils Relative: 3 % (ref 0–5)
Lymphocytes Relative: 14 % (ref 12–46)
Lymphocytes Relative: 15 % (ref 12–46)
Lymphocytes Relative: 17 % (ref 12–46)
Lymphocytes Relative: 8 % — ABNORMAL LOW (ref 12–46)
Lymphs Abs: 1.7 10*3/uL (ref 0.7–4.0)
Lymphs Abs: 1.9 10*3/uL (ref 0.7–4.0)
Lymphs Abs: 2 10*3/uL (ref 0.7–4.0)
Lymphs Abs: 2.5 10*3/uL (ref 0.7–4.0)
Monocytes Absolute: 0.5 10*3/uL (ref 0.1–1.0)
Monocytes Absolute: 0.6 10*3/uL (ref 0.1–1.0)
Monocytes Absolute: 0.7 10*3/uL (ref 0.1–1.0)
Monocytes Absolute: 0.8 10*3/uL (ref 0.1–1.0)
Monocytes Absolute: 1.1 10*3/uL — ABNORMAL HIGH (ref 0.1–1.0)
Monocytes Relative: 6 % (ref 3–12)
Monocytes Relative: 7 % (ref 3–12)
Monocytes Relative: 8 % (ref 3–12)
Monocytes Relative: 8 % (ref 3–12)
Myelocytes: 0 %
Myelocytes: 0 %
Neutro Abs: 10.6 10*3/uL — ABNORMAL HIGH (ref 1.7–7.7)
Neutro Abs: 5.7 10*3/uL (ref 1.7–7.7)
Neutro Abs: 7.1 10*3/uL (ref 1.7–7.7)
Neutro Abs: 8 10*3/uL — ABNORMAL HIGH (ref 1.7–7.7)
Neutrophils Relative %: 70 % (ref 43–77)
Neutrophils Relative %: 72 % (ref 43–77)
Neutrophils Relative %: 76 % (ref 43–77)
Neutrophils Relative %: 84 % — ABNORMAL HIGH (ref 43–77)
nRBC: 0 /100 WBC
nRBC: 0 /100 WBC

## 2011-05-01 LAB — COMPREHENSIVE METABOLIC PANEL
ALT: 41 U/L — ABNORMAL HIGH (ref 0–35)
ALT: 86 U/L — ABNORMAL HIGH (ref 0–35)
AST: 19 U/L (ref 0–37)
AST: 23 U/L (ref 0–37)
AST: 46 U/L — ABNORMAL HIGH (ref 0–37)
AST: 72 U/L — ABNORMAL HIGH (ref 0–37)
Albumin: 2.9 g/dL — ABNORMAL LOW (ref 3.5–5.2)
Albumin: 3.1 g/dL — ABNORMAL LOW (ref 3.5–5.2)
Albumin: 3.3 g/dL — ABNORMAL LOW (ref 3.5–5.2)
Albumin: 3.7 g/dL (ref 3.5–5.2)
Alkaline Phosphatase: 64 U/L (ref 39–117)
Alkaline Phosphatase: 75 U/L (ref 39–117)
BUN: 10 mg/dL (ref 6–23)
BUN: 2 mg/dL — ABNORMAL LOW (ref 6–23)
CO2: 27 mEq/L (ref 19–32)
CO2: 29 mEq/L (ref 19–32)
CO2: 30 mEq/L (ref 19–32)
Calcium: 8.4 mg/dL (ref 8.4–10.5)
Calcium: 8.4 mg/dL (ref 8.4–10.5)
Calcium: 9 mg/dL (ref 8.4–10.5)
Chloride: 112 mEq/L (ref 96–112)
Creatinine, Ser: 0.52 mg/dL (ref 0.4–1.2)
Creatinine, Ser: 0.53 mg/dL (ref 0.4–1.2)
Creatinine, Ser: 0.62 mg/dL (ref 0.4–1.2)
Creatinine, Ser: 0.66 mg/dL (ref 0.4–1.2)
GFR calc Af Amer: >60 mL/min (ref >60)
GFR calc Af Amer: >60 mL/min (ref >60)
GFR calc Af Amer: >60 mL/min (ref >60)
GFR calc non Af Amer: >60 mL/min (ref >60)
GFR calc non Af Amer: >60 mL/min (ref >60)
GFR calc non Af Amer: >60 mL/min (ref >60)
GFR calc non Af Amer: >60 mL/min (ref >60)
Glucose, Bld: 125 mg/dL — ABNORMAL HIGH (ref 70–99)
Glucose, Bld: 136 mg/dL — ABNORMAL HIGH (ref 70–99)
Potassium: 2.9 mEq/L — ABNORMAL LOW (ref 3.5–5.1)
Potassium: 3.6 mEq/L (ref 3.5–5.1)
Potassium: 3.8 mEq/L (ref 3.5–5.1)
Potassium: 4.1 mEq/L (ref 3.5–5.1)
Sodium: 140 mEq/L (ref 135–145)
Sodium: 141 mEq/L (ref 135–145)
Sodium: 142 mEq/L (ref 135–145)
Sodium: 143 mEq/L (ref 135–145)
Total Bilirubin: 0.8 mg/dL (ref 0.3–1.2)
Total Bilirubin: 1.1 mg/dL (ref 0.3–1.2)
Total Protein: 5.4 g/dL — ABNORMAL LOW (ref 6.0–8.3)
Total Protein: 5.5 g/dL — ABNORMAL LOW (ref 6.0–8.3)
Total Protein: 5.9 g/dL — ABNORMAL LOW (ref 6.0–8.3)
Total Protein: 6.7 g/dL (ref 6.0–8.3)

## 2011-05-01 LAB — PREGNANCY, URINE: Preg Test, Ur: NEGATIVE

## 2011-05-01 LAB — CROSSMATCH

## 2011-05-01 LAB — RPR: RPR Ser Ql: NONREACTIVE

## 2011-05-01 LAB — LIPID PANEL
Triglycerides: 58 mg/dL (ref <150)
VLDL: 12 mg/dL (ref 0–40)

## 2011-05-01 LAB — WET PREP, GENITAL: Yeast Wet Prep HPF POC: NONE SEEN

## 2011-05-01 LAB — LIPASE, BLOOD
Lipase: 1405 U/L — ABNORMAL HIGH (ref 11–59)
Lipase: 269 U/L — ABNORMAL HIGH (ref 11–59)
Lipase: 82 U/L — ABNORMAL HIGH (ref 11–59)

## 2011-05-01 LAB — URINE MICROSCOPIC-ADD ON

## 2011-05-01 LAB — GLUCOSE, CAPILLARY: Glucose-Capillary: 98 mg/dL (ref 70–99)

## 2011-05-01 LAB — GC/CHLAMYDIA PROBE AMP, GENITAL: GC Probe Amp, Genital: NEGATIVE

## 2011-05-19 ENCOUNTER — Other Ambulatory Visit (HOSPITAL_COMMUNITY): Payer: Self-pay | Admitting: Internal Medicine

## 2011-05-19 DIAGNOSIS — H70899 Other mastoiditis and related conditions, unspecified ear: Secondary | ICD-10-CM

## 2011-05-21 ENCOUNTER — Ambulatory Visit (HOSPITAL_COMMUNITY)
Admission: RE | Admit: 2011-05-21 | Discharge: 2011-05-21 | Disposition: A | Discharge status: Home / Self Care | Payer: Self-pay | Source: Ambulatory Visit | Attending: Internal Medicine | Admitting: Internal Medicine

## 2011-05-21 DIAGNOSIS — H70899 Other mastoiditis and related conditions, unspecified ear: Secondary | ICD-10-CM

## 2011-05-21 DIAGNOSIS — H6690 Otitis media, unspecified, unspecified ear: Secondary | ICD-10-CM | POA: Insufficient documentation

## 2011-07-14 ENCOUNTER — Encounter (HOSPITAL_COMMUNITY): Payer: Self-pay | Admitting: Emergency Medicine

## 2011-07-14 ENCOUNTER — Inpatient Hospital Stay (HOSPITAL_COMMUNITY)
Admission: EM | Admit: 2011-07-14 | Discharge: 2011-07-18 | DRG: 440 | Disposition: A | Discharge status: Home / Self Care | Payer: Self-pay | Attending: Internal Medicine | Admitting: Internal Medicine

## 2011-07-14 ENCOUNTER — Encounter (HOSPITAL_COMMUNITY): Payer: Self-pay | Admitting: *Deleted

## 2011-07-14 ENCOUNTER — Emergency Department (HOSPITAL_COMMUNITY): Payer: Self-pay

## 2011-07-14 ENCOUNTER — Emergency Department (HOSPITAL_COMMUNITY)
Admission: EM | Admit: 2011-07-14 | Discharge: 2011-07-14 | Disposition: A | Discharge status: Home / Self Care | Payer: Self-pay | Attending: Emergency Medicine | Admitting: Emergency Medicine

## 2011-07-14 ENCOUNTER — Other Ambulatory Visit: Payer: Self-pay

## 2011-07-14 DIAGNOSIS — E876 Hypokalemia: Secondary | ICD-10-CM | POA: Diagnosis present

## 2011-07-14 DIAGNOSIS — K449 Diaphragmatic hernia without obstruction or gangrene: Secondary | ICD-10-CM | POA: Diagnosis present

## 2011-07-14 DIAGNOSIS — J449 Chronic obstructive pulmonary disease, unspecified: Secondary | ICD-10-CM | POA: Diagnosis present

## 2011-07-14 DIAGNOSIS — R112 Nausea with vomiting, unspecified: Secondary | ICD-10-CM | POA: Insufficient documentation

## 2011-07-14 DIAGNOSIS — J4489 Other specified chronic obstructive pulmonary disease: Secondary | ICD-10-CM | POA: Diagnosis present

## 2011-07-14 DIAGNOSIS — K859 Acute pancreatitis without necrosis or infection, unspecified: Principal | ICD-10-CM | POA: Diagnosis present

## 2011-07-14 DIAGNOSIS — F17200 Nicotine dependence, unspecified, uncomplicated: Secondary | ICD-10-CM | POA: Diagnosis present

## 2011-07-14 DIAGNOSIS — I4510 Unspecified right bundle-branch block: Secondary | ICD-10-CM | POA: Insufficient documentation

## 2011-07-14 DIAGNOSIS — I498 Other specified cardiac arrhythmias: Secondary | ICD-10-CM | POA: Insufficient documentation

## 2011-07-14 DIAGNOSIS — K861 Other chronic pancreatitis: Secondary | ICD-10-CM | POA: Insufficient documentation

## 2011-07-14 DIAGNOSIS — J45909 Unspecified asthma, uncomplicated: Secondary | ICD-10-CM | POA: Insufficient documentation

## 2011-07-14 DIAGNOSIS — F411 Generalized anxiety disorder: Secondary | ICD-10-CM | POA: Diagnosis present

## 2011-07-14 DIAGNOSIS — F319 Bipolar disorder, unspecified: Secondary | ICD-10-CM | POA: Diagnosis present

## 2011-07-14 DIAGNOSIS — R1013 Epigastric pain: Secondary | ICD-10-CM | POA: Insufficient documentation

## 2011-07-14 DIAGNOSIS — R109 Unspecified abdominal pain: Secondary | ICD-10-CM

## 2011-07-14 DIAGNOSIS — R0902 Hypoxemia: Secondary | ICD-10-CM | POA: Diagnosis present

## 2011-07-14 DIAGNOSIS — Z9079 Acquired absence of other genital organ(s): Secondary | ICD-10-CM | POA: Insufficient documentation

## 2011-07-14 DIAGNOSIS — K219 Gastro-esophageal reflux disease without esophagitis: Secondary | ICD-10-CM | POA: Diagnosis present

## 2011-07-14 LAB — DIFFERENTIAL
Basophils Absolute: 0 10*3/uL (ref 0.0–0.1)
Basophils Relative: 0 % (ref 0–1)
Eosinophils Absolute: 0.4 10*3/uL (ref 0.0–0.7)
Eosinophils Relative: 3 % (ref 0–5)
Lymphocytes Relative: 19 % (ref 12–46)
Monocytes Absolute: 0.9 10*3/uL (ref 0.1–1.0)

## 2011-07-14 LAB — COMPREHENSIVE METABOLIC PANEL
AST: 20 U/L (ref 0–37)
Albumin: 4.2 g/dL (ref 3.5–5.2)
CO2: 29 mEq/L (ref 19–32)
Calcium: 10 mg/dL (ref 8.4–10.5)
Chloride: 103 mEq/L (ref 96–112)
Creatinine, Ser: 0.65 mg/dL (ref 0.50–1.10)
GFR calc non Af Amer: >90 mL/min (ref >90)
Glucose, Bld: 141 mg/dL — ABNORMAL HIGH (ref 70–99)
Total Protein: 7.5 g/dL (ref 6.0–8.3)

## 2011-07-14 LAB — CBC
HCT: 43.7 % (ref 36.0–46.0)
Hemoglobin: 15.1 g/dL — ABNORMAL HIGH (ref 12.0–15.0)
MCH: 31.9 pg (ref 26.0–34.0)
MCHC: 34.6 g/dL (ref 30.0–36.0)
MCV: 92.2 fL (ref 78.0–100.0)
RDW: 11.6 % (ref 11.5–15.5)
WBC: 14.2 10*3/uL — ABNORMAL HIGH (ref 4.0–10.5)

## 2011-07-14 LAB — ETHANOL: Alcohol, Ethyl (B): <11 mg/dL (ref 0–11)

## 2011-07-14 LAB — URINALYSIS, ROUTINE W REFLEX MICROSCOPIC
Bilirubin Urine: NEGATIVE
Glucose, UA: NEGATIVE mg/dL
Leukocytes, UA: NEGATIVE
Protein, ur: NEGATIVE mg/dL
Urobilinogen, UA: 0.2 mg/dL (ref 0.0–1.0)
pH: 6 (ref 5.0–8.0)

## 2011-07-14 MED ORDER — HYDROMORPHONE HCL PF 2 MG/ML IJ SOLN
2.0000 mg | Freq: Once | INTRAMUSCULAR | Status: AC
  Administered 2011-07-14: 2 mg via INTRAVENOUS
  Filled 2011-07-14: qty 1

## 2011-07-14 MED ORDER — IOHEXOL 300 MG/ML  SOLN: 40.0000 mL | INTRAMUSCULAR | Status: AC

## 2011-07-14 MED ORDER — PROMETHAZINE HCL 25 MG/ML IJ SOLN
25.0000 mg | Freq: Once | INTRAMUSCULAR | Status: AC
  Administered 2011-07-14: 25 mg via INTRAVENOUS
  Filled 2011-07-14: qty 1

## 2011-07-14 MED ORDER — SODIUM CHLORIDE 0.9 % IV BOLUS (SEPSIS)
1000.0000 mL | Freq: Once | INTRAVENOUS | Status: AC
  Administered 2011-07-14: 1000 mL via INTRAVENOUS

## 2011-07-14 MED ORDER — HYDROCODONE-ACETAMINOPHEN 5-325 MG PO TABS: 2.0000 | ORAL_TABLET | ORAL | Status: DC | PRN

## 2011-07-14 MED ORDER — HYDROMORPHONE HCL PF 1 MG/ML IJ SOLN
1.0000 mg | Freq: Once | INTRAMUSCULAR | Status: AC
  Administered 2011-07-14: 1 mg via INTRAVENOUS
  Filled 2011-07-14: qty 1

## 2011-07-14 MED ORDER — ONDANSETRON HCL 4 MG/2ML IJ SOLN
4.0000 mg | Freq: Once | INTRAMUSCULAR | Status: AC
  Administered 2011-07-14: 4 mg via INTRAVENOUS
  Filled 2011-07-14: qty 2

## 2011-07-14 MED ORDER — IOHEXOL 300 MG/ML  SOLN
100.0000 mL | Freq: Once | INTRAMUSCULAR | Status: AC | PRN
  Administered 2011-07-14: 100 mL via INTRAVENOUS

## 2011-07-14 NOTE — ED Notes (Signed)
Pt reports abd pain 5/10 and nausea unchanged.  edp notified and no new orders received.

## 2011-07-14 NOTE — ED Notes (Signed)
Patient complaining of abdominal pain x 2 days. Was seen here last night for pancreatitis and discharged home but states the pain medications are not helping.

## 2011-07-14 NOTE — ED Provider Notes (Addendum)
History     CSN: [id number] Arrival date & time: 07/14/2011 10:20 PM   First MD Initiated Contact with Patient 07/14/11 2315      Chief Complaint  Patient presents with  . Abdominal Pain    (Consider location/radiation/quality/duration/timing/severity/associated sxs/prior treatment) Patient is a 40 y.o. female presenting with abdominal pain. The history is provided by the patient.  Abdominal Pain The primary symptoms of the illness include abdominal pain, vomiting and diarrhea. The primary symptoms of the illness do not include dysuria. The current episode started 2 days ago. The onset of the illness was gradual.  The abdominal pain has been gradually worsening since its onset. The abdominal pain is located in the epigastric region. The abdominal pain radiates to the back. The abdominal pain is relieved by nothing. The abdominal pain is exacerbated by eating.  The illness is associated with eating. Symptoms associated with the illness do not include constipation or hematuria.  Pt has history of pancreatitis.  Pt was seen yesterday and was diagnosed with same.  Pt states the pain has gotten worse since then and the medications are not working.  Past Medical History  Diagnosis Date  . Asthma   . Depression   . Migraine   . Pancreatitis chronic     Past Surgical History  Procedure Date  . Cholecystectomy   . Tonsillectomy   . Abdominal hysterectomy     Family History  Problem Relation Age of Onset  . Asthma Other   . Coronary artery disease Neg Hx     History  Substance Use Topics  . Smoking status: Current Everyday Smoker -- 1.0 packs/day for 15 years    Types: Cigarettes  . Smokeless tobacco: Current User  . Alcohol Use: No    OB History    Grav Para Term Preterm Abortions TAB SAB Ect Mult Living                  Review of Systems  Gastrointestinal: Positive for vomiting, abdominal pain and diarrhea. Negative for constipation.  Genitourinary: Negative for  dysuria and hematuria.    Allergies  Penicillins; Prednisone; and Sulfa antibiotics  Home Medications   Current Outpatient Rx  Name Route Sig Dispense Refill  . ALBUTEROL SULFATE HFA 108 (90 BASE) MCG/ACT IN AERS Inhalation Inhale 2 puffs into the lungs daily as needed. For shortness of breath    . ALBUTEROL SULFATE (2.5 MG/3ML) 0.083% IN NEBU Nebulization Take 2.5 mg by nebulization every 6 (six) hours as needed. For shortness of breath     . AMLODIPINE BESYLATE 10 MG PO TABS Oral Take 10 mg by mouth daily.      Marland Kitchen CITALOPRAM HYDROBROMIDE 40 MG PO TABS Oral Take 40 mg by mouth daily.      Marland Kitchen CLONAZEPAM 2 MG PO TABS Oral Take 2 mg by mouth 3 (three) times daily as needed. anxiety    . HYDROCODONE-ACETAMINOPHEN 10-650 MG PO TABS Oral Take 1 tablet by mouth daily as needed. For pain     . HYDROCODONE-ACETAMINOPHEN 5-325 MG PO TABS Oral Take 2 tablets by mouth every 4 (four) hours as needed for pain. 10 tablet 0  . OMEPRAZOLE 20 MG PO CPDR Oral Take 20 mg by mouth 3 (three) times daily.      Marland Kitchen DOCUSATE SODIUM 100 MG PO CAPS Oral Take 100 mg by mouth at bedtime.        BP 131/87  Pulse 94  Temp(Src) 98.3 F (36.8 C) (Oral)  Resp  18  Ht 5\' 2"  (1.575 m)  Wt 163 lb (73.936 kg)  BMI 29.81 kg/m2  SpO2 100%  Physical Exam  Nursing note and vitals reviewed. Constitutional: She appears well-developed and well-nourished.  Non-toxic appearance.  HENT:  Head: Normocephalic and atraumatic.  Right Ear: External ear normal.  Left Ear: External ear normal.  Eyes: Conjunctivae are normal. Right eye exhibits no discharge. Left eye exhibits no discharge. No scleral icterus.  Neck: Neck supple. No tracheal deviation present.  Cardiovascular: Normal rate, regular rhythm and intact distal pulses.   Pulmonary/Chest: Effort normal and breath sounds normal. No stridor. No respiratory distress. She has no wheezes. She has no rales.  Abdominal: Soft. Bowel sounds are normal. She exhibits no distension.  There is tenderness in the epigastric area. There is no rigidity, no rebound and no guarding. No hernia.  Musculoskeletal: She exhibits no edema and no tenderness.  Neurological: She is alert. She has normal strength. No sensory deficit. Cranial nerve deficit:  no gross defecits noted. She exhibits normal muscle tone. She displays no seizure activity. Coordination normal.  Skin: Skin is warm and dry. No rash noted.  Psychiatric: She has a normal mood and affect.    ED Course  Procedures (including critical care time)  Labs Reviewed  CBC - Abnormal; Notable for the following:    WBC 12.3 (*)    All other components within normal limits  DIFFERENTIAL - Abnormal; Notable for the following:    Neutro Abs 7.8 (*)    All other components within normal limits  COMPREHENSIVE METABOLIC PANEL - Abnormal; Notable for the following:    Potassium 3.2 (*)    Glucose, Bld 110 (*)    ALT 36 (*)    All other components within normal limits  LIPASE, BLOOD - Abnormal; Notable for the following:    Lipase 503 (*)    All other components within normal limits  URINALYSIS, ROUTINE W REFLEX MICROSCOPIC - Abnormal; Notable for the following:    Color, Urine STRAW (*)    Specific Gravity, Urine <1.005 (*)    All other components within normal limits   Ct Abdomen Pelvis W Contrast  07/14/2011  *RADIOLOGY REPORT*  Clinical Data: Abdominal pain, pancreatitis  CT ABDOMEN AND PELVIS WITH CONTRAST  Technique:  Multidetector CT imaging of the abdomen and pelvis was performed following the standard protocol during bolus administration of intravenous contrast.  Contrast: OMNIPAQUE IOHEXOL 300 MG/ML IV SOLN  Comparison: June 18, 2010  Findings: There is stranding within the peripancreatic fat at the head and uncinate process, as well as a small amount of peripancreatic fluid but no focal fluid collection to suggest a pseudocyst.  The enhancement of the pancreatic parenchyma is homogeneous at this time.  There  is inflammation of the adjacent duodenum.  The splenic vein is patent.    Mild chronic interstitial changes and atelectasis are noted at the lung bases.  The gallbladder is surgically absent.  The liver, spleen, adrenal glands, kidneys, urinary bladder have an unremarkable appearance. There is anterior wedging of T12 which is unchanged.  The bowel is unremarkable with no evidence of gross inflammation or obstruction.  There is no pneumoperitoneum, adenopathy, or free fluid within the abdomen or pelvis.  Moderate sized hiatal hernia is noted.  The uterus is surgically absent. The adnexal regions are unremarkable.  IMPRESSION: There is evidence of pancreatitis at the head and uncinate process without pseudocyst formation or gross necrosis at this time.  Moderate sized hiatal  hernia.  Original Report Authenticated By: Brandon Melnick, M.D.   Dg Abd Acute W/chest  07/14/2011  *RADIOLOGY REPORT*  Clinical Data: Epigastric pain.  Pancreatitis.  ACUTE ABDOMEN SERIES (ABDOMEN 2 VIEW & CHEST 1 VIEW)  Comparison: 04/21/2011  Findings: Heart and lungs appear normal.  No free air or free fluid in the abdomen.  Bowel gas pattern is normal.  Surgical clips from previous cholecystectomy.  Osseous structures are normal.  IMPRESSION: Benign-appearing abdomen and chest.  Original Report Authenticated By: Gwynn Burly, M.D.     Dx: Pancreatitis   MDM  Pt seen in the ED yesterday.  Labs and ct scan consistent with pancreatitis.  Pt with persistent symptoms.  CT scan shows continued elevation in lipase.  Will admit for pain management.        Celene Kras, MD 07/15/11 1610  Celene Kras, MD 07/15/11 [phone]

## 2011-07-14 NOTE — ED Notes (Signed)
Reports abdominal pain due to chronic pancreatitis.  Reports nausea and diarrhea for 3 days.

## 2011-07-14 NOTE — ED Provider Notes (Signed)
History   This chart was scribed for Chelsea Octave, MD by Clarita Crane. The patient was seen in room [facility]06/[facility]06 and the patient's care was started at 7:20am.   CSN: [id number] Arrival date & time: 07/14/2011  6:37 AM   First MD Initiated Contact with Patient 07/14/11 [phone]      Chief Complaint  Patient presents with  . Abdominal Pain    (Consider location/radiation/quality/duration/timing/severity/associated sxs/prior treatment) HPI [name] is a 40 y.o. female who presents to the Emergency Department complaining of chronic moderate abdominal pain associated with pancreatitis that started a few days ago. She reports having previous episodes of pancreatis and says the pain feels similar to previous episodes of pancreatitis. States abdominal pain is aggravated with eating and not relieved with use of Hydrocodone. She denies fever, chills, and coughing. She does report having some nausea. Patient reports previously having cholecystectomy, and had a previous stay in the hospital for pneumonia 1 month ago.   Past Medical History  Diagnosis Date  . Asthma   . Depression   . Migraine   . Pancreatitis chronic     Past Surgical History  Procedure Date  . Cholecystectomy   . Tonsillectomy   . Abdominal hysterectomy     Family History  Problem Relation Age of Onset  . Asthma Other   . Coronary artery disease Neg Hx     History  Substance Use Topics  . Smoking status: Current Everyday Smoker -- 1.0 packs/day for 15 years    Types: Cigarettes  . Smokeless tobacco: Current User  . Alcohol Use: No    OB History    Grav Para Term Preterm Abortions TAB SAB Ect Mult Living                  Review of Systems  Constitutional: Negative for chills and fatigue.  HENT: Negative for congestion, sinus pressure and ear discharge.   Eyes: Negative for discharge.  Respiratory: Negative for cough.   Cardiovascular: Negative for chest pain.  Gastrointestinal: Positive for  nausea and abdominal pain. Negative for vomiting and diarrhea.  Genitourinary: Negative for frequency and hematuria.  Musculoskeletal: Negative for back pain.  Skin: Negative for rash.  Neurological: Negative for seizures and headaches.  Hematological: Negative.   Psychiatric/Behavioral: Negative for hallucinations.    Allergies  Penicillins; Prednisone; and Sulfa antibiotics  Home Medications   Current Outpatient Rx  Name Route Sig Dispense Refill  . ALBUTEROL SULFATE HFA 108 (90 BASE) MCG/ACT IN AERS Inhalation Inhale 2 puffs into the lungs daily as needed. For shortness of breath    . ALBUTEROL SULFATE (2.5 MG/3ML) 0.083% IN NEBU Nebulization Take 2.5 mg by nebulization every 6 (six) hours as needed. For shortness of breath     . AMLODIPINE BESYLATE 10 MG PO TABS Oral Take 10 mg by mouth daily.      Marland Kitchen CITALOPRAM HYDROBROMIDE 40 MG PO TABS Oral Take 40 mg by mouth daily.      Marland Kitchen CLONAZEPAM 2 MG PO TABS Oral Take 2 mg by mouth 3 (three) times daily as needed. anxiety    . DOCUSATE SODIUM 100 MG PO CAPS Oral Take 100 mg by mouth at bedtime.      Marland Kitchen HYDROCODONE-ACETAMINOPHEN 10-650 MG PO TABS Oral Take 1 tablet by mouth daily as needed. For pain     . OMEPRAZOLE 20 MG PO CPDR Oral Take 20 mg by mouth 3 (three) times daily.      Marland Kitchen HYDROCODONE-ACETAMINOPHEN 5-325 MG  PO TABS Oral Take 2 tablets by mouth every 4 (four) hours as needed for pain. 10 tablet 0  . PANCRELIPASE (LIP-PROT-AMYL) 12000 UNITS PO CPEP Oral Take 1 capsule by mouth 3 (three) times daily with meals. 270 capsule 2    BP 107/78  Pulse 92  Temp(Src) 97.8 F (36.6 C) (Oral)  Resp 15  Ht 5\' 2"  (1.575 m)  Wt 163 lb (73.936 kg)  BMI 29.81 kg/m2  SpO2 98%  Physical Exam  Nursing note and vitals reviewed. Constitutional: She is oriented to person, place, and time. She appears well-developed and well-nourished. No distress.  HENT:  Head: Normocephalic and atraumatic.  Eyes: EOM are normal. Pupils are equal, round, and  reactive to light.  Neck: Neck supple. No tracheal deviation present.  Cardiovascular: Normal rate, regular rhythm and normal heart sounds.  Exam reveals no gallop and no friction rub.   No murmur heard. Pulmonary/Chest: Effort normal. No respiratory distress. She has no wheezes.  Abdominal: Soft. She exhibits no distension. There is tenderness (minimal epigastric tenderness).  Musculoskeletal: Normal range of motion. She exhibits no edema.  Neurological: She is alert and oriented to person, place, and time. No sensory deficit.  Skin: Skin is warm and dry.  Psychiatric: She has a normal mood and affect. Her behavior is normal.    ED Course  Procedures (including critical care time)  DIAGNOSTIC STUDIES: Oxygen Saturation is 100% on room air, normal by my interpretation.    COORDINATION OF CARE:     Labs Reviewed  CBC - Abnormal; Notable for the following:    WBC 14.2 (*)    Hemoglobin 15.1 (*)    All other components within normal limits  DIFFERENTIAL - Abnormal; Notable for the following:    Neutro Abs 10.2 (*)    All other components within normal limits  COMPREHENSIVE METABOLIC PANEL - Abnormal; Notable for the following:    Glucose, Bld 141 (*)    ALT 37 (*)    All other components within normal limits  LIPASE, BLOOD - Abnormal; Notable for the following:    Lipase 252 (*)    All other components within normal limits  URINALYSIS, ROUTINE W REFLEX MICROSCOPIC - Abnormal; Notable for the following:    Specific Gravity, Urine >1.030 (*)    All other components within normal limits  ETHANOL   Ct Abdomen Pelvis W Contrast  07/14/2011  *RADIOLOGY REPORT*  Clinical Data: Abdominal pain, pancreatitis  CT ABDOMEN AND PELVIS WITH CONTRAST  Technique:  Multidetector CT imaging of the abdomen and pelvis was performed following the standard protocol during bolus administration of intravenous contrast.  Contrast: OMNIPAQUE IOHEXOL 300 MG/ML IV SOLN  Comparison: June 18, 2010  Findings: There is stranding within the peripancreatic fat at the head and uncinate process, as well as a small amount of peripancreatic fluid but no focal fluid collection to suggest a pseudocyst.  The enhancement of the pancreatic parenchyma is homogeneous at this time.  There is inflammation of the adjacent duodenum.  The splenic vein is patent.    Mild chronic interstitial changes and atelectasis are noted at the lung bases.  The gallbladder is surgically absent.  The liver, spleen, adrenal glands, kidneys, urinary bladder have an unremarkable appearance. There is anterior wedging of T12 which is unchanged.  The bowel is unremarkable with no evidence of gross inflammation or obstruction.  There is no pneumoperitoneum, adenopathy, or free fluid within the abdomen or pelvis.  Moderate sized hiatal hernia is  noted.  The uterus is surgically absent. The adnexal regions are unremarkable.  IMPRESSION: There is evidence of pancreatitis at the head and uncinate process without pseudocyst formation or gross necrosis at this time.  Moderate sized hiatal hernia.  Original Report Authenticated By: Brandon Melnick, M.D.   Dg Abd Acute W/chest  07/14/2011  *RADIOLOGY REPORT*  Clinical Data: Epigastric pain.  Pancreatitis.  ACUTE ABDOMEN SERIES (ABDOMEN 2 VIEW & CHEST 1 VIEW)  Comparison: 04/21/2011  Findings: Heart and lungs appear normal.  No free air or free fluid in the abdomen.  Bowel gas pattern is normal.  Surgical clips from previous cholecystectomy.  Osseous structures are normal.  IMPRESSION: Benign-appearing abdomen and chest.  Original Report Authenticated By: Gwynn Burly, M.D.     1. Pancreatitis   2. Abdominal  pain, other specified site       MDM  Epigastric abdominal pain, nausea, vomiting diarrhea similar to previous episodes of pancreatitis. There is nothing different about this pain. There is no fever, chest pain, shortness of breath. Abdomen is soft with minimal epigastric  tenderness.  Lipase elevation noted similar to previous.  On reassessment, patient sleeping comfortably.  Arouses to voice.  Tolerating PO. Labs and CT reviewed with patient.  I personally performed the services described in this documentation, which was scribed in my presence.  The recorded information has been reviewed and considered.    Date: 07/14/2011  Rate: 75  Rhythm: sinus arrhythmia  QRS Axis: normal  Intervals: QT prolonged  ST/T Wave abnormalities: normal  Conduction Disutrbances:right bundle branch block  Narrative Interpretation:   Old EKG Reviewed: unchanged    Chelsea Octave, MD 07/14/11 1811

## 2011-07-15 ENCOUNTER — Encounter (HOSPITAL_COMMUNITY): Payer: Self-pay | Admitting: *Deleted

## 2011-07-15 DIAGNOSIS — K859 Acute pancreatitis without necrosis or infection, unspecified: Principal | ICD-10-CM | POA: Diagnosis present

## 2011-07-15 DIAGNOSIS — E876 Hypokalemia: Secondary | ICD-10-CM | POA: Diagnosis present

## 2011-07-15 LAB — URINALYSIS, ROUTINE W REFLEX MICROSCOPIC
Glucose, UA: NEGATIVE mg/dL
Leukocytes, UA: NEGATIVE
Specific Gravity, Urine: <1.005 — ABNORMAL LOW (ref 1.005–1.030)
pH: 5.5 (ref 5.0–8.0)

## 2011-07-15 LAB — COMPREHENSIVE METABOLIC PANEL
AST: 21 U/L (ref 0–37)
Albumin: 4.1 g/dL (ref 3.5–5.2)
BUN: 9 mg/dL (ref 6–23)
Calcium: 9.5 mg/dL (ref 8.4–10.5)
Chloride: 100 mEq/L (ref 96–112)
Creatinine, Ser: 0.68 mg/dL (ref 0.50–1.10)
Sodium: 137 mEq/L (ref 135–145)
Total Protein: 7.5 g/dL (ref 6.0–8.3)

## 2011-07-15 LAB — DIFFERENTIAL
Basophils Absolute: 0 10*3/uL (ref 0.0–0.1)
Basophils Relative: 0 % (ref 0–1)
Eosinophils Absolute: 0.4 10*3/uL (ref 0.0–0.7)
Eosinophils Relative: 4 % (ref 0–5)
Monocytes Absolute: 1 10*3/uL (ref 0.1–1.0)
Monocytes Relative: 8 % (ref 3–12)
Neutro Abs: 7.8 10*3/uL — ABNORMAL HIGH (ref 1.7–7.7)

## 2011-07-15 LAB — CBC
HCT: 42.5 % (ref 36.0–46.0)
MCH: 31.6 pg (ref 26.0–34.0)
MCHC: 33.9 g/dL (ref 30.0–36.0)
MCV: 93.2 fL (ref 78.0–100.0)
Platelets: 270 10*3/uL (ref 150–400)
RDW: 11.8 % (ref 11.5–15.5)
WBC: 12.3 10*3/uL — ABNORMAL HIGH (ref 4.0–10.5)

## 2011-07-15 LAB — MAGNESIUM: Magnesium: 1.9 mg/dL (ref 1.5–2.5)

## 2011-07-15 MED ORDER — ONDANSETRON HCL 4 MG/2ML IJ SOLN
4.0000 mg | Freq: Once | INTRAMUSCULAR | Status: AC
  Administered 2011-07-15: 4 mg via INTRAVENOUS
  Filled 2011-07-15: qty 2

## 2011-07-15 MED ORDER — PANTOPRAZOLE SODIUM 40 MG IV SOLR
40.0000 mg | Freq: Two times a day (BID) | INTRAVENOUS | Status: DC
  Administered 2011-07-15 – 2011-07-17 (×6): 40 mg via INTRAVENOUS
  Filled 2011-07-15 (×6): qty 40

## 2011-07-15 MED ORDER — ALBUTEROL SULFATE HFA 108 (90 BASE) MCG/ACT IN AERS
2.0000 | INHALATION_SPRAY | Freq: Every day | RESPIRATORY_TRACT | Status: DC | PRN
Start: 2011-07-15 — End: 2011-07-18
  Filled 2011-07-15: qty 6.7

## 2011-07-15 MED ORDER — ALBUTEROL SULFATE (5 MG/ML) 0.5% IN NEBU: 2.5000 mg | INHALATION_SOLUTION | Freq: Four times a day (QID) | RESPIRATORY_TRACT | Status: DC | PRN

## 2011-07-15 MED ORDER — NICOTINE 21 MG/24HR TD PT24
21.0000 mg | MEDICATED_PATCH | Freq: Every day | TRANSDERMAL | Status: DC
  Administered 2011-07-15 – 2011-07-18 (×4): 21 mg via TRANSDERMAL
  Filled 2011-07-15 (×6): qty 1

## 2011-07-15 MED ORDER — HYDROMORPHONE HCL PF 1 MG/ML IJ SOLN
1.0000 mg | Freq: Once | INTRAMUSCULAR | Status: AC
  Administered 2011-07-15: 1 mg via INTRAVENOUS
  Filled 2011-07-15: qty 1

## 2011-07-15 MED ORDER — CITALOPRAM HYDROBROMIDE 20 MG PO TABS
40.0000 mg | ORAL_TABLET | Freq: Every day | ORAL | Status: DC
  Administered 2011-07-15 – 2011-07-18 (×4): 40 mg via ORAL
  Filled 2011-07-15 (×4): qty 2

## 2011-07-15 MED ORDER — BISACODYL 10 MG RE SUPP: 10.0000 mg | Freq: Every day | RECTAL | Status: DC | PRN

## 2011-07-15 MED ORDER — FLEET ENEMA 7-19 GM/118ML RE ENEM: 1.0000 | ENEMA | Freq: Once | RECTAL | Status: AC | PRN

## 2011-07-15 MED ORDER — ONDANSETRON HCL 4 MG/2ML IJ SOLN
4.0000 mg | INTRAMUSCULAR | Status: DC | PRN
  Administered 2011-07-15 – 2011-07-18 (×9): 4 mg via INTRAVENOUS
  Filled 2011-07-15 (×9): qty 2

## 2011-07-15 MED ORDER — HYDROMORPHONE HCL PF 1 MG/ML IJ SOLN
2.0000 mg | INTRAMUSCULAR | Status: DC | PRN
  Administered 2011-07-15 – 2011-07-16 (×11): 2 mg via INTRAVENOUS
  Filled 2011-07-15 (×12): qty 2

## 2011-07-15 MED ORDER — PROMETHAZINE HCL 25 MG/ML IJ SOLN
12.5000 mg | Freq: Four times a day (QID) | INTRAMUSCULAR | Status: DC | PRN
  Administered 2011-07-15 – 2011-07-17 (×4): 12.5 mg via INTRAVENOUS
  Filled 2011-07-15 (×4): qty 1

## 2011-07-15 MED ORDER — AMLODIPINE BESYLATE 5 MG PO TABS
10.0000 mg | ORAL_TABLET | Freq: Every day | ORAL | Status: DC
  Administered 2011-07-16 – 2011-07-18 (×3): 10 mg via ORAL
  Filled 2011-07-15 (×4): qty 2

## 2011-07-15 MED ORDER — SODIUM CHLORIDE 0.9 % IJ SOLN
INTRAMUSCULAR | Status: AC
  Administered 2011-07-15: 21:00:00
  Filled 2011-07-15: qty 6

## 2011-07-15 MED ORDER — ENOXAPARIN SODIUM 40 MG/0.4ML ~~LOC~~ SOLN
40.0000 mg | SUBCUTANEOUS | Status: DC
  Administered 2011-07-15 – 2011-07-17 (×3): 40 mg via SUBCUTANEOUS
  Filled 2011-07-15 (×3): qty 0.4

## 2011-07-15 MED ORDER — POTASSIUM CHLORIDE IN NACL 20-0.9 MEQ/L-% IV SOLN
INTRAVENOUS | Status: DC
  Administered 2011-07-15 – 2011-07-16 (×6): via INTRAVENOUS

## 2011-07-15 NOTE — Progress Notes (Signed)
Report given to [place] , Actuary, working the [employer], Charity fundraiser) on Dept 300.  Pt is alert and will be transferred to room 317.  Pt taken to room 317 by L. Zenda Alpers, NT and M. Toler,RN in wheelchair.  Pt's belongings taken with her also, family aware of transfer.

## 2011-07-15 NOTE — Progress Notes (Signed)
Subjective: She still complaining of epigastric pain and apparently desaturate after receive pain medication, she has history of chronic pancreatitis Objective: Vital signs in last 24 hours: Filed Vitals:   07/15/11 0500 07/15/11 0600 07/15/11 0700 07/15/11 0800  BP: 123/72 117/76 117/67 100/68  Pulse: 74 87 96 90  Temp:    99 F (37.2 C)  TempSrc:    Oral  Resp: 13 9 14 10   Height:      Weight: 75.7 kg (166 lb 14.2 oz)     SpO2: 95% 96% 96% 96%   Weight change:   Intake/Output Summary (Last 24 hours) at 07/15/11 0841 Last data filed at 07/15/11 0800  Gross per 24 hour  Intake  547.5 ml  Output      0 ml  Net  547.5 ml  not on respiratory distress , no Jvp, no LN  Heart :  s1 and s2 , no added sounds Lung  Normal breathing no rales Abdomen: soft , tender at the epigastric area. No rebound ,no guardening. BS present Extremities no edema, peripheral pulse intact    Lab Results:  Basename 07/15/11 0500 07/14/11 2326 07/14/11 0716  NA -- 137 141  K -- 3.2* 3.7  CL -- 100 103  CO2 -- 26 29  GLUCOSE -- 110* 141*  BUN -- 9 11  CREATININE -- 0.68 0.65  CALCIUM -- 9.5 10.0  MG 1.9 -- --  PHOS -- -- --    [facility] 07/14/11 2326 07/14/11 0716  AST 21 20  ALT 36* 37*  ALKPHOS 82 78  BILITOT 0.6 0.7  PROT 7.5 7.5  ALBUMIN 4.1 4.2    Basename 07/14/11 2326 07/14/11 0716  LIPASE 503* 252*  AMYLASE -- --    Alvira Philips 07/14/11 2326 07/14/11 0716  WBC 12.3* 14.2*  NEUTROABS 7.8* 10.2*  HGB 14.4 15.1*  HCT 42.5 43.7  MCV 93.2 92.2  PLT 270 256   No results found for this basename: CKTOTAL:3,CKMB:3,CKMBINDEX:3,TROPONINI:3 in the last 72 hours No components found with this basename: POCBNP:3 No results found for this basename: DDIMER:2 in the last 72 hours No results found for this basename: HGBA1C:2 in the last 72 hours No results found for this basename: CHOL:2,HDL:2,LDLCALC:2,TRIG:2,CHOLHDL:2,LDLDIRECT:2 in the last 72 hours No results found for this basename:  TSH,T4TOTAL,FREET3,T3FREE,THYROIDAB in the last 72 hours No results found for this basename: VITAMINB12:2,FOLATE:2,FERRITIN:2,TIBC:2,IRON:2,RETICCTPCT:2 in the last 72 hours  Micro Results: No results found for this or any previous visit (from the past 240 hour(s)).  Studies/Results: Ct Abdomen Pelvis W Contrast  07/14/2011  *RADIOLOGY REPORT*  Clinical Data: Abdominal pain, pancreatitis  CT ABDOMEN AND PELVIS WITH CONTRAST  Technique:  Multidetector CT imaging of the abdomen and pelvis was performed following the standard protocol during bolus administration of intravenous contrast.  Contrast: OMNIPAQUE IOHEXOL 300 MG/ML IV SOLN  Comparison: June 18, 2010  Findings: There is stranding within the peripancreatic fat at the head and uncinate process, as well as a small amount of peripancreatic fluid but no focal fluid collection to suggest a pseudocyst.  The enhancement of the pancreatic parenchyma is homogeneous at this time.  There is inflammation of the adjacent duodenum.  The splenic vein is patent.    Mild chronic interstitial changes and atelectasis are noted at the lung bases.  The gallbladder is surgically absent.  The liver, spleen, adrenal glands, kidneys, urinary bladder have an unremarkable appearance. There is anterior wedging of T12 which is unchanged.  The bowel is unremarkable with no evidence  of gross inflammation or obstruction.  There is no pneumoperitoneum, adenopathy, or free fluid within the abdomen or pelvis.  Moderate sized hiatal hernia is noted.  The uterus is surgically absent. The adnexal regions are unremarkable.  IMPRESSION: There is evidence of pancreatitis at the head and uncinate process without pseudocyst formation or gross necrosis at this time.  Moderate sized hiatal hernia.  Original Report Authenticated By: Brandon Melnick, M.D.   Dg Abd Acute W/chest  07/14/2011  *RADIOLOGY REPORT*  Clinical Data: Epigastric pain.  Pancreatitis.  ACUTE ABDOMEN SERIES  (ABDOMEN 2 VIEW & CHEST 1 VIEW)  Comparison: 04/21/2011  Findings: Heart and lungs appear normal.  No free air or free fluid in the abdomen.  Bowel gas pattern is normal.  Surgical clips from previous cholecystectomy.  Osseous structures are normal.  IMPRESSION: Benign-appearing abdomen and chest.  Original Report Authenticated By: Gwynn Burly, M.D.    Medications: I have reviewed the patient's current medications. Scheduled Meds:   . amLODipine  10 mg Oral Daily  . citalopram  40 mg Oral Daily  . enoxaparin  40 mg Subcutaneous Q24H  . HYDROmorphone  1 mg Intravenous Once  . HYDROmorphone  1 mg Intravenous Once  . nicotine  21 mg Transdermal Daily  . ondansetron  4 mg Intravenous Once  . ondansetron  4 mg Intravenous Once  . pantoprazole (PROTONIX) IV  40 mg Intravenous Q12H  . sodium chloride  1,000 mL Intravenous Once   Continuous Infusions:   . 0.9 % NaCl with KCl 20 mEq / L 150 mL/hr at 07/15/11 0800   PRN Meds:.albuterol, albuterol, bisacodyl, HYDROmorphone, ondansetron (ZOFRAN) IV, promethazine, sodium phosphate  Assessment/Plan: 1- acute on chronic pancreatitis: will continue supportive measures, add clear liquid diet. Pt with multiple admissions for the same reason ,need to follow up with DR CONWAY at [facility] 2-hypokalemia : replacements 3- ongoing tobacco abuse: counceling, 4- hypoxia: related to medication ; can be transfer to tele.  LOS: 1 day  Alissah Redmon I. 07/15/2011, 8:41 AM

## 2011-07-15 NOTE — H&P (Signed)
PCP:   Cassell Smiles., MD   Chief Complaint:  Nausea vomiting and abdominal pain for 3 days  HPI: [name] is an 40 y.o. Caucasian female. Lung history of idiopathic pancreatitis he managed by Dr. Andrey Campanile out of [facility]; status post recurrent celiac blocks for chronic pain due to pancreatitis; able to afford all her medications because of lack of insurance; currently not taking Creon. Has been in baseline state of health until Sunday and she took some Goody's powders for headache and began having epigastric pain nausea and vomiting. She says the pain radiates through to her back and up into her chest, associated with vomiting of yellow material whenever she tries to eat. She visited the emergency room yesterday was evaluated found to have an elevated and discharged home with pain medications. She does continue to eat a normal diet her abdominal pain has gotten worse and she returns to the emergency room today are lipase was repeated and was found to be elevated further and the hospitalist service was called to assist with management.  Her last meal was 6 PM Tuesday; it was a meal of chicken and she immediately vomited.  She denies fever cough or cold chest pains or shortness of breath.  Continues to smoke half to one pack of cigarettes per day  Rewiew of Systems:  The patient denies anorexia, fever, weight loss,, vision loss, decreased hearing, hoarseness, chest pain, syncope, dyspnea on exertion, peripheral edema, balance deficits, hemoptysis,melena, hematochezia,  hematuria, incontinence, genital sores, muscle weakness, suspicious skin lesions, transient blindness, difficulty walking, , unusual weight change, abnormal bleeding, enlarged lymph nodes, angioedema, and breast masses.   Past Medical History  Diagnosis Date  . Asthma   . Depression   . Migraine   . Pancreatitis chronic     Past Surgical History  Procedure Date  . Cholecystectomy   . Tonsillectomy    . Abdominal hysterectomy     Medications:  HOME MEDS: Prior to Admission medications   Medication Sig Start Date End Date Taking? Authorizing Provider  albuterol (PROVENTIL HFA;VENTOLIN HFA) 108 (90 BASE) MCG/ACT inhaler Inhale 2 puffs into the lungs daily as needed. For shortness of breath   Yes Historical Provider, MD  albuterol (PROVENTIL) (2.5 MG/3ML) 0.083% nebulizer solution Take 2.5 mg by nebulization every 6 (six) hours as needed. For shortness of breath    Yes Historical Provider, MD  amLODipine (NORVASC) 10 MG tablet Take 10 mg by mouth daily.     Yes Historical Provider, MD  citalopram (CELEXA) 40 MG tablet Take 40 mg by mouth daily.     Yes Historical Provider, MD  clonazePAM (KLONOPIN) 2 MG tablet Take 2 mg by mouth 3 (three) times daily as needed. anxiety   Yes Historical Provider, MD  HYDROcodone-acetaminophen (LORCET) 10-650 MG per tablet Take 1 tablet by mouth daily as needed. For pain    Yes Historical Provider, MD  HYDROcodone-acetaminophen (NORCO) 5-325 MG per tablet Take 2 tablets by mouth every 4 (four) hours as needed for pain. 07/14/11 07/24/11 Yes [name], MD  omeprazole (PRILOSEC) 20 MG capsule Take 20 mg by mouth 3 (three) times daily.     Yes Historical Provider, MD  docusate sodium (COLACE) 100 MG capsule Take 100 mg by mouth at bedtime.      Historical Provider, MD     Allergies:  Allergies  Allergen Reactions  . Penicillins     REACTION: Unknown reaction  . Prednisone     pancreatitis  .  Sulfa Antibiotics     Social History:   reports that she has been smoking Cigarettes.  She has a 15 pack-year smoking history. She uses smokeless tobacco. She reports that she does not drink alcohol or use illicit drugs.  Family History: Family History  Problem Relation Age of Onset  . Asthma Other   . Coronary artery disease Neg Hx      Physical Exam: Filed Vitals:   07/14/11 2216 07/15/11 0130  BP: 131/87 111/61  Pulse: 94 87  Temp: 98.3 F  (36.8 C)   TempSrc: Oral   Resp: 18 18  Height: 5\' 2"  (1.575 m)   Weight: 73.936 kg (163 lb)   SpO2: 100% 93%   Blood pressure 111/61, pulse 87, temperature 98.3 F (36.8 C), temperature source Oral, resp. rate 18, height 5\' 2"  (1.575 m), weight 73.936 kg (163 lb), SpO2 93.00%.  GEN:  Depressed looking mildly obese Caucasian lady lying in the stretcher complaining of pain; cooperative with exam PSYCH:  alert and oriented x4;  HEENT: Mucous membranes pink and dry and anicteric; PERRLA; EOM intact; no cervical lymphadenopathy nor thyromegaly or carotid bruit; no JVD; Breasts:: Not examined CHEST WALL: No tenderness CHEST: Normal respiration, clear to auscultation bilaterally HEART: Regular rate and rhythm; no murmurs rubs or gallops BACK: No kyphosis or scoliosis; no CVA tenderness ABDOMEN: Obese, epigastric; no masses, no organomegaly, normal abdominal bowel sounds; Rectal Exam: Not done EXTREMITIES:  no edema; no ulcerations. Genitalia: not examined PULSES: 2+ and symmetric SKIN: Normal hydration no rash or ulceration CNS: Cranial nerves 2-12 grossly intact no focal neurologic deficit   Labs & Imaging Results for orders placed during the hospital encounter of 07/14/11 (from the past 48 hour(s))  CBC     Status: Abnormal   Collection Time   07/14/11 11:26 PM      Component Value Range Comment   WBC 12.3 (*) 4.0 - 10.5 (K/uL)    RBC 4.56  3.87 - 5.11 (MIL/uL)    Hemoglobin 14.4  12.0 - 15.0 (g/dL)    HCT 16.1  09.6 - 04.5 (%)    MCV 93.2  78.0 - 100.0 (fL)    MCH 31.6  26.0 - 34.0 (pg)    MCHC 33.9  30.0 - 36.0 (g/dL)    RDW 40.9  81.1 - 91.4 (%)    Platelets 270  150 - 400 (K/uL)   DIFFERENTIAL     Status: Abnormal   Collection Time   07/14/11 11:26 PM      Component Value Range Comment   Neutrophils Relative 63  43 - 77 (%)    Neutro Abs 7.8 (*) 1.7 - 7.7 (K/uL)    Lymphocytes Relative 25  12 - 46 (%)    Lymphs Abs 3.1  0.7 - 4.0 (K/uL)    Monocytes Relative 8  3 -  12 (%)    Monocytes Absolute 1.0  0.1 - 1.0 (K/uL)    Eosinophils Relative 4  0 - 5 (%)    Eosinophils Absolute 0.4  0.0 - 0.7 (K/uL)    Basophils Relative 0  0 - 1 (%)    Basophils Absolute 0.0  0.0 - 0.1 (K/uL)   COMPREHENSIVE METABOLIC PANEL     Status: Abnormal   Collection Time   07/14/11 11:26 PM      Component Value Range Comment   Sodium 137  135 - 145 (mEq/L)    Potassium 3.2 (*) 3.5 - 5.1 (mEq/L)    Chloride 100  96 - 112 (mEq/L)    CO2 26  19 - 32 (mEq/L)    Glucose, Bld 110 (*) 70 - 99 (mg/dL)    BUN 9  6 - 23 (mg/dL)    Creatinine, Ser 4.78  0.50 - 1.10 (mg/dL)    Calcium 9.5  8.4 - 10.5 (mg/dL)    Total Protein 7.5  6.0 - 8.3 (g/dL)    Albumin 4.1  3.5 - 5.2 (g/dL)    AST 21  0 - 37 (U/L)    ALT 36 (*) 0 - 35 (U/L)    Alkaline Phosphatase 82  39 - 117 (U/L)    Total Bilirubin 0.6  0.3 - 1.2 (mg/dL)    GFR calc non Af Amer >90  >90 (mL/min)    GFR calc Af Amer >90  >90 (mL/min)   LIPASE, BLOOD     Status: Abnormal   Collection Time   07/14/11 11:26 PM      Component Value Range Comment   Lipase 503 (*) 11 - 59 (U/L)   URINALYSIS, ROUTINE W REFLEX MICROSCOPIC     Status: Abnormal   Collection Time   07/14/11 11:49 PM      Component Value Range Comment   Color, Urine STRAW (*) YELLOW     APPearance CLEAR  CLEAR     Specific Gravity, Urine <1.005 (*) 1.005 - 1.030     pH 5.5  5.0 - 8.0     Glucose, UA NEGATIVE  NEGATIVE (mg/dL)    Hgb urine dipstick NEGATIVE  NEGATIVE     Bilirubin Urine NEGATIVE  NEGATIVE     Ketones, ur NEGATIVE  NEGATIVE (mg/dL)    Protein, ur NEGATIVE  NEGATIVE (mg/dL)    Urobilinogen, UA 0.2  0.0 - 1.0 (mg/dL)    Nitrite NEGATIVE  NEGATIVE     Leukocytes, UA NEGATIVE  NEGATIVE  MICROSCOPIC NOT DONE ON URINES WITH NEGATIVE PROTEIN, BLOOD, LEUKOCYTES, NITRITE, OR GLUCOSE <1000 mg/dL.   Ct Abdomen Pelvis W Contrast  07/14/2011  *RADIOLOGY REPORT*  Clinical Data: Abdominal pain, pancreatitis  CT ABDOMEN AND PELVIS WITH CONTRAST  Technique:   Multidetector CT imaging of the abdomen and pelvis was performed following the standard protocol during bolus administration of intravenous contrast.  Contrast: OMNIPAQUE IOHEXOL 300 MG/ML IV SOLN  Comparison: June 18, 2010  Findings: There is stranding within the peripancreatic fat at the head and uncinate process, as well as a small amount of peripancreatic fluid but no focal fluid collection to suggest a pseudocyst.  The enhancement of the pancreatic parenchyma is homogeneous at this time.  There is inflammation of the adjacent duodenum.  The splenic vein is patent.    Mild chronic interstitial changes and atelectasis are noted at the lung bases.  The gallbladder is surgically absent.  The liver, spleen, adrenal glands, kidneys, urinary bladder have an unremarkable appearance. There is anterior wedging of T12 which is unchanged.  The bowel is unremarkable with no evidence of gross inflammation or obstruction.  There is no pneumoperitoneum, adenopathy, or free fluid within the abdomen or pelvis.  Moderate sized hiatal hernia is noted.  The uterus is surgically absent. The adnexal regions are unremarkable.  IMPRESSION: There is evidence of pancreatitis at the head and uncinate process without pseudocyst formation or gross necrosis at this time.  Moderate sized hiatal hernia.  Original Report Authenticated By: Brandon Melnick, M.D.   Dg Abd Acute W/chest  07/14/2011  *RADIOLOGY REPORT*  Clinical Data: Epigastric pain.  Pancreatitis.  ACUTE ABDOMEN SERIES (ABDOMEN 2 VIEW & CHEST 1 VIEW)  Comparison: 04/21/2011  Findings: Heart and lungs appear normal.  No free air or free fluid in the abdomen.  Bowel gas pattern is normal.  Surgical clips from previous cholecystectomy.  Osseous structures are normal.  IMPRESSION: Benign-appearing abdomen and chest.  Original Report Authenticated By: Gwynn Burly, M.D.      Assessment Present on Admission:  .Acute  pancreatitis .Hypokalemia  .ANXIETY .DEPRESSION  .GERD .HIATAL HERNIA  PLAN: Admit lady for the management of yet another episode of her pancreatitis. We'll keep her nil by mouth and given IV fluid. Replete her potassium. Adequate pain control. Continue Celexa for her depression. Intravenous proton pump inhibitor Her counseling read tobacco cessation  Other plans as per orders.   Kathan Kirker 07/15/2011, 1:59 AM

## 2011-07-15 NOTE — Progress Notes (Signed)
Pt states that she has not been to void since being admitted during the night. Assisted pt up to [facility], however after sitting there several minutes, pt still unable to void.  Used the bladder scanner and results of 62ml in bladder.  Will continue to ask pt about using the bathroom and will use the bladder scanner again if needed.

## 2011-07-15 NOTE — Progress Notes (Signed)
CSW received referral related to pt's frequent returns to hospital.  CM aware and to follow.  CSW to sign off unless further needs arise.   Karn Cassis

## 2011-07-16 LAB — COMPREHENSIVE METABOLIC PANEL
ALT: 29 U/L (ref 0–35)
CO2: 24 mEq/L (ref 19–32)
Calcium: 8.5 mg/dL (ref 8.4–10.5)
Chloride: 105 mEq/L (ref 96–112)
GFR calc Af Amer: >90 mL/min (ref >90)
GFR calc non Af Amer: >90 mL/min (ref >90)
Glucose, Bld: 118 mg/dL — ABNORMAL HIGH (ref 70–99)
Sodium: 136 mEq/L (ref 135–145)
Total Bilirubin: 0.8 mg/dL (ref 0.3–1.2)

## 2011-07-16 LAB — CBC
HCT: 38.3 % (ref 36.0–46.0)
MCH: 31.8 pg (ref 26.0–34.0)
MCHC: 33.4 g/dL (ref 30.0–36.0)
MCV: 95.3 fL (ref 78.0–100.0)
Platelets: 221 10*3/uL (ref 150–400)
RDW: 11.7 % (ref 11.5–15.5)
WBC: 10.9 10*3/uL — ABNORMAL HIGH (ref 4.0–10.5)

## 2011-07-16 MED ORDER — OXYCODONE HCL 5 MG PO TABS
5.0000 mg | ORAL_TABLET | Freq: Four times a day (QID) | ORAL | Status: DC | PRN
  Administered 2011-07-17 – 2011-07-18 (×4): 5 mg via ORAL
  Filled 2011-07-16 (×4): qty 1

## 2011-07-16 MED ORDER — SODIUM CHLORIDE 0.9 % IJ SOLN
INTRAMUSCULAR | Status: AC
  Administered 2011-07-16: 04:00:00
  Filled 2011-07-16: qty 3

## 2011-07-16 MED ORDER — SODIUM CHLORIDE 0.9 % IJ SOLN
INTRAMUSCULAR | Status: AC
  Administered 2011-07-16: 20 mL
  Filled 2011-07-16: qty 6

## 2011-07-16 MED ORDER — HYDROMORPHONE HCL PF 1 MG/ML IJ SOLN
2.0000 mg | INTRAMUSCULAR | Status: DC | PRN
  Administered 2011-07-16 – 2011-07-17 (×6): 2 mg via INTRAVENOUS
  Filled 2011-07-16 (×3): qty 2
  Filled 2011-07-16 (×2): qty 1
  Filled 2011-07-16 (×2): qty 2

## 2011-07-16 MED ORDER — SODIUM CHLORIDE 0.9 % IJ SOLN
INTRAMUSCULAR | Status: AC
  Filled 2011-07-16: qty 3

## 2011-07-16 NOTE — Progress Notes (Signed)
Subjective: She still complaining of abdominal pain, and to 3 episode of vomiting, and she admitted he cannot tolerate any diet at this time  Objective: Vital signs in last 24 hours: Filed Vitals:   07/15/11 2000 07/16/11 0000 07/16/11 0400 07/16/11 0459  BP: 102/66 103/69 108/68   Pulse: 106 118 122   Temp: 98 F (36.7 C) 98.5 F (36.9 C) 99.2 F (37.3 C)   TempSrc: Oral Oral Oral   Resp: 19 20 18    Height:      Weight:    78.382 kg (172 lb 12.8 oz)  SpO2: 94% 93% 93%    Weight change: 4.445 kg (9 lb 12.8 oz)  Intake/Output Summary (Last 24 hours) at 07/16/11 1152 Last data filed at 07/15/11 1600  Gross per 24 hour  Intake   1110 ml  Output    550 ml  Net    560 ml   She's not on respiratory distress, her pupil equal reactive to light and accommodation Neck supple with no lymph node Heart S1 and S2 with no added sounds Lungs normal vesicular breathing Abdomen tender to palpation but no rebound no guarding bowel sounds present Extremity without edema   Lab Results:  Basename 07/16/11 0515 07/15/11 0500 07/14/11 2326  NA 136 -- 137  K 4.0 -- 3.2*  CL 105 -- 100  CO2 24 -- 26  GLUCOSE 118* -- 110*  BUN 3* -- 9  CREATININE 0.56 -- 0.68  CALCIUM 8.5 -- 9.5  MG -- 1.9 --  PHOS -- -- --    Basename 07/16/11 0515 07/14/11 2326  AST 21 21  ALT 29 36*  ALKPHOS 67 82  BILITOT 0.8 0.6  PROT 5.9* 7.5  ALBUMIN 3.1* 4.1    Basename 07/16/11 0515 07/14/11 2326  LIPASE 67* 503*  AMYLASE 130* --    Basename 07/16/11 0515 07/14/11 2326 07/14/11 0716  WBC 10.9* 12.3* --  NEUTROABS -- 7.8* 10.2*  HGB 12.8 14.4 --  HCT 38.3 42.5 --  MCV 95.3 93.2 --  PLT 221 270 --    Micro Results: Recent Results (from the past 240 hour(s))  MRSA PCR SCREENING     Status: Normal   Collection Time   07/15/11  4:05 AM      Component Value Range Status Comment   MRSA by PCR NEGATIVE  NEGATIVE  Final     Studies/Results: Ct Abdomen Pelvis W Contrast  07/14/2011   *RADIOLOGY REPORT*  Clinical Data: Abdominal pain, pancreatitis  CT ABDOMEN AND PELVIS WITH CONTRAST  Technique:  Multidetector CT imaging of the abdomen and pelvis was performed following the standard protocol during bolus administration of intravenous contrast.  Contrast: OMNIPAQUE IOHEXOL 300 MG/ML IV SOLN  Comparison: June 18, 2010  Findings: There is stranding within the peripancreatic fat at the head and uncinate process, as well as a small amount of peripancreatic fluid but no focal fluid collection to suggest a pseudocyst.  The enhancement of the pancreatic parenchyma is homogeneous at this time.  There is inflammation of the adjacent duodenum.  The splenic vein is patent.    Mild chronic interstitial changes and atelectasis are noted at the lung bases.  The gallbladder is surgically absent.  The liver, spleen, adrenal glands, kidneys, urinary bladder have an unremarkable appearance. There is anterior wedging of T12 which is unchanged.  The bowel is unremarkable with no evidence of gross inflammation or obstruction.  There is no pneumoperitoneum, adenopathy, or free fluid within the abdomen or  pelvis.  Moderate sized hiatal hernia is noted.  The uterus is surgically absent. The adnexal regions are unremarkable.  IMPRESSION: There is evidence of pancreatitis at the head and uncinate process without pseudocyst formation or gross necrosis at this time.  Moderate sized hiatal hernia.  Original Report Authenticated By: Brandon Melnick, M.D.   Dg Abd Acute W/chest  07/14/2011  *RADIOLOGY REPORT*  Clinical Data: Epigastric pain.  Pancreatitis.  ACUTE ABDOMEN SERIES (ABDOMEN 2 VIEW & CHEST 1 VIEW)  Comparison: 04/21/2011  Findings: Heart and lungs appear normal.  No free air or free fluid in the abdomen.  Bowel gas pattern is normal.  Surgical clips from previous cholecystectomy.  Osseous structures are normal.  IMPRESSION: Benign-appearing abdomen and chest.  Original Report Authenticated By: Gwynn Burly, M.D.    Medications: I have reviewed the patient's current medications. Scheduled Meds:   . amLODipine  10 mg Oral Daily  . citalopram  40 mg Oral Daily  . enoxaparin  40 mg Subcutaneous Q24H  . nicotine  21 mg Transdermal Daily  . pantoprazole (PROTONIX) IV  40 mg Intravenous Q12H  . sodium chloride      . sodium chloride      . sodium chloride       Continuous Infusions:   . 0.9 % NaCl with KCl 20 mEq / L 150 mL/hr at 07/16/11 0955   PRN Meds:.albuterol, albuterol, bisacodyl, HYDROmorphone, ondansetron (ZOFRAN) IV, promethazine, sodium phosphate  Assessment/Plan: 1-acute on chronic pancreatitis: amylase trending down ,but pt still complains of pain and vomiting will continue with clear liquid diet for now, continue with pain medications, continue IV FLUID . 2- HTN: HOME medications 3-hypoxia related to narcotics will decrease dilaudid to q3 hr  LOS: 2 days  Carolos Fecher I. 07/16/2011, 11:52 AM

## 2011-07-17 LAB — COMPREHENSIVE METABOLIC PANEL
AST: 25 U/L (ref 0–37)
Albumin: 2.8 g/dL — ABNORMAL LOW (ref 3.5–5.2)
BUN: <3 mg/dL — ABNORMAL LOW (ref 6–23)
Calcium: 8.4 mg/dL (ref 8.4–10.5)
Chloride: 106 mEq/L (ref 96–112)
Creatinine, Ser: 0.54 mg/dL (ref 0.50–1.10)
Sodium: 137 mEq/L (ref 135–145)
Total Protein: 5.4 g/dL — ABNORMAL LOW (ref 6.0–8.3)

## 2011-07-17 LAB — LIPASE, BLOOD: Lipase: 21 U/L (ref 11–59)

## 2011-07-17 LAB — AMYLASE: Amylase: 71 U/L (ref 0–105)

## 2011-07-17 MED ORDER — SODIUM CHLORIDE 0.9 % IJ SOLN
INTRAMUSCULAR | Status: AC
  Filled 2011-07-17: qty 3

## 2011-07-17 MED ORDER — SODIUM CHLORIDE 0.9 % IJ SOLN
INTRAMUSCULAR | Status: AC
  Administered 2011-07-17: 10 mL
  Filled 2011-07-17: qty 3

## 2011-07-17 MED ORDER — PANCRELIPASE (LIP-PROT-AMYL) 12000-38000 UNITS PO CPEP
2.0000 | ORAL_CAPSULE | Freq: Three times a day (TID) | ORAL | Status: DC
  Administered 2011-07-17 – 2011-07-18 (×4): 2 via ORAL
  Filled 2011-07-17 (×4): qty 2

## 2011-07-17 MED ORDER — HYDROMORPHONE HCL PF 1 MG/ML IJ SOLN
1.0000 mg | INTRAMUSCULAR | Status: DC | PRN
  Administered 2011-07-17 – 2011-07-18 (×5): 1 mg via INTRAVENOUS
  Filled 2011-07-17 (×5): qty 1

## 2011-07-17 NOTE — Progress Notes (Signed)
Subjective: Feels better today, abdominal pain improved, tolerate a full liquid diet  Objective: Vital signs in last 24 hours: Filed Vitals:   07/16/11 2000 07/17/11 0000 07/17/11 0400 07/17/11 0500  BP: 103/66 114/73 109/72   Pulse: 97 94 96   Temp: 97.8 F (36.6 C) 98 F (36.7 C) 97.9 F (36.6 C)   TempSrc: Oral Oral Oral   Resp: 19 16 18    Height:      Weight:    80.241 kg (176 lb 14.4 oz)  SpO2: 95% 94% 94%    Weight change: 1.86 kg (4 lb 1.6 oz) No intake or output data in the 24 hours ending 07/17/11 1029  Lying on bed not on respiratory distress, mucosa moist, no oral thrush Neck supple with no lymph nodes Heart S1 and S2 no added sounds Abdomen soft nontender bowel sounds present Extremities without edema and peripheral pulses intact  Lab Results:  Basename 07/17/11 0529 07/16/11 0515 07/15/11 0500  NA 137 136 --  K 4.1 4.0 --  CL 106 105 --  CO2 28 24 --  GLUCOSE 90 118* --  BUN <3* 3* --  CREATININE 0.54 0.56 --  CALCIUM 8.4 8.5 --  MG -- -- 1.9  PHOS -- -- --    [facility] 07/17/11 0529 07/16/11 0515  AST 25 21  ALT 31 29  ALKPHOS 57 67  BILITOT 0.8 0.8  PROT 5.4* 5.9*  ALBUMIN 2.8* 3.1*    Basename 07/17/11 0529 07/16/11 0515  LIPASE 21 67*  AMYLASE 71 130*    Basename 07/16/11 0515 07/14/11 2326  WBC 10.9* 12.3*  NEUTROABS -- 7.8*  HGB 12.8 14.4  HCT 38.3 42.5  MCV 95.3 93.2  PLT 221 270   No results found for this basename: CKTOTAL:3,CKMB:3,CKMBINDEX:3,TROPONINI:3 in the last 72 hours No components found with this basename: POCBNP:3 No results found for this basename: DDIMER:2 in the last 72 hours No results found for this basename: HGBA1C:2 in the last 72 hours No results found for this basename: CHOL:2,HDL:2,LDLCALC:2,TRIG:2,CHOLHDL:2,LDLDIRECT:2 in the last 72 hours No results found for this basename: TSH,T4TOTAL,FREET3,T3FREE,THYROIDAB in the last 72 hours No results found for this basename:  VITAMINB12:2,FOLATE:2,FERRITIN:2,TIBC:2,IRON:2,RETICCTPCT:2 in the last 72 hours  Micro Results: Recent Results (from the past 240 hour(s))  MRSA PCR SCREENING     Status: Normal   Collection Time   07/15/11  4:05 AM      Component Value Range Status Comment   MRSA by PCR NEGATIVE  NEGATIVE  Final     Studies/Results: Ct Abdomen Pelvis W Contrast  07/14/2011  *RADIOLOGY REPORT*  Clinical Data: Abdominal pain, pancreatitis  CT ABDOMEN AND PELVIS WITH CONTRAST  Technique:  Multidetector CT imaging of the abdomen and pelvis was performed following the standard protocol during bolus administration of intravenous contrast.  Contrast: OMNIPAQUE IOHEXOL 300 MG/ML IV SOLN  Comparison: June 18, 2010  Findings: There is stranding within the peripancreatic fat at the head and uncinate process, as well as a small amount of peripancreatic fluid but no focal fluid collection to suggest a pseudocyst.  The enhancement of the pancreatic parenchyma is homogeneous at this time.  There is inflammation of the adjacent duodenum.  The splenic vein is patent.    Mild chronic interstitial changes and atelectasis are noted at the lung bases.  The gallbladder is surgically absent.  The liver, spleen, adrenal glands, kidneys, urinary bladder have an unremarkable appearance. There is anterior wedging of T12 which is unchanged.  The bowel is unremarkable  with no evidence of gross inflammation or obstruction.  There is no pneumoperitoneum, adenopathy, or free fluid within the abdomen or pelvis.  Moderate sized hiatal hernia is noted.  The uterus is surgically absent. The adnexal regions are unremarkable.  IMPRESSION: There is evidence of pancreatitis at the head and uncinate process without pseudocyst formation or gross necrosis at this time.  Moderate sized hiatal hernia.  Original Report Authenticated By: Brandon Melnick, M.D.   Dg Abd Acute W/chest  07/14/2011  *RADIOLOGY REPORT*  Clinical Data: Epigastric pain.   Pancreatitis.  ACUTE ABDOMEN SERIES (ABDOMEN 2 VIEW & CHEST 1 VIEW)  Comparison: 04/21/2011  Findings: Heart and lungs appear normal.  No free air or free fluid in the abdomen.  Bowel gas pattern is normal.  Surgical clips from previous cholecystectomy.  Osseous structures are normal.  IMPRESSION: Benign-appearing abdomen and chest.  Original Report Authenticated By: Gwynn Burly, M.D.    Medications: I have reviewed the patient's current medications. Scheduled Meds:   . amLODipine  10 mg Oral Daily  . citalopram  40 mg Oral Daily  . enoxaparin  40 mg Subcutaneous Q24H  . nicotine  21 mg Transdermal Daily  . pantoprazole (PROTONIX) IV  40 mg Intravenous Q12H  . sodium chloride      . sodium chloride      . sodium chloride       Continuous Infusions:   . DISCONTD: 0.9 % NaCl with KCl 20 mEq / L 150 mL/hr at 07/16/11 2114   PRN Meds:.albuterol, albuterol, bisacodyl, HYDROmorphone, ondansetron (ZOFRAN) IV, oxyCODONE, promethazine, DISCONTD: HYDROmorphone, DISCONTD: HYDROmorphone  Assessment/Plan: 1-acute on chronic pancreatitis seems improving will start the patient on low-fat diet, creon [po, would wean off pain medications and switch to oral pain medication. 2- hypoxia resolved. Anticipate dc in am  LOS: 3 days  Talissa Apple I. 07/17/2011, 10:29 AM

## 2011-07-17 NOTE — Progress Notes (Signed)
Utilization review completed.  

## 2011-07-18 MED ORDER — PANCRELIPASE (LIP-PROT-AMYL) 12000-38000 UNITS PO CPEP: 2.0000 | ORAL_CAPSULE | Freq: Three times a day (TID) | ORAL | Status: DC

## 2011-07-18 MED ORDER — HYDROCODONE-ACETAMINOPHEN 10-650 MG PO TABS: 1.0000 | ORAL_TABLET | Freq: Every day | ORAL | Status: DC | PRN

## 2011-07-18 MED ORDER — SODIUM CHLORIDE 0.9 % IJ SOLN
INTRAMUSCULAR | Status: AC
  Filled 2011-07-18: qty 3

## 2011-07-18 MED ORDER — BISACODYL 10 MG RE SUPP: 10.0000 mg | Freq: Every day | RECTAL | Status: AC | PRN

## 2011-07-18 NOTE — Discharge Summary (Signed)
DISCHARGE SUMMARY  [name]  MR#: [id number]  DOB:04-Aug-1970  Date of Admission: 07/14/2011 Date of Discharge: 07/18/2011  Attending Physician:[name].  Patient's [name],Chelsea J., MD, MD  Consults:NONE  Discharge Diagnoses: 1-acute on chronic pancreatitis 2-COPD 3-ongoing tobacco abuse 4-ANXIETY 5- HYPOXIA   resolved Current Discharge Medication List    START taking these medications   Details  bisacodyl (DULCOLAX) 10 MG suppository Place 1 suppository (10 mg total) rectally daily as needed. Qty: 30 suppository, Refills: 0    lipase/protease/amylase (CREON-10/PANCREASE) 12000 UNITS CPEP Take 2 capsules by mouth 3 (three) times daily before meals. Qty: 270 capsule, Refills: 0      CONTINUE these medications which have CHANGED   Details  HYDROcodone-acetaminophen (LORCET) 10-650 MG per tablet Take 1 tablet by mouth daily as needed. For pain Qty: 30 tablet, Refills: 0      CONTINUE these medications which have NOT CHANGED   Details  albuterol (PROVENTIL HFA;VENTOLIN HFA) 108 (90 BASE) MCG/ACT inhaler Inhale 2 puffs into Chelsea lungs daily as needed. For shortness of breath    albuterol (PROVENTIL) (2.5 MG/3ML) 0.083% nebulizer solution Take 2.5 mg by nebulization every 6 (six) hours as needed. For shortness of breath     amLODipine (NORVASC) 10 MG tablet Take 10 mg by mouth daily.      citalopram (CELEXA) 40 MG tablet Take 40 mg by mouth daily.      clonazePAM (KLONOPIN) 2 MG tablet Take 2 mg by mouth 3 (three) times daily as needed. anxiety    omeprazole (PRILOSEC) 20 MG capsule Take 20 mg by mouth 3 (three) times daily.      docusate sodium (COLACE) 100 MG capsule Take 100 mg by mouth at bedtime.        STOP taking these medications     HYDROcodone-acetaminophen (NORCO) 5-325 MG per tablet        Procedure:CT ABDOMEN AND PELVISThere is evidence of pancreatitis at Chelsea head and uncinate process  without pseudocyst formation or gross necrosis  at this time.    Hospital Course: This is a 40 year old position female is a history of anorexic pancreatitis managed by Dr. Andrey [name] at [name] status post recurrence in [facility] block for chronic pain due to pancreatitis she is admitted because of severe epigastric pain associated which nausea and vomiting, Chelsea pain radiated to her back. On [facility] emergency room found to have an elevated lipase, she denies any fever. Accordingly patient admitted to [facility] started also porta measures with IV fluid and n.p.o. patient pain gradually improved but she still complaining of epigastric pain when she must. Patient's pain significantly improved from previously she is able to tolerate diet. Amylase level normalized l normalized , CT abdomen and pelvis did not show any evidence of complicated pancreatitis, her blood work up seemed stable .she is tolerating 75 % of her meal, case management consulted for medications. She was advised to followup with Dr. Andrey [name] as soon as after  Chelsea holiday Day of Discharge BP 128/86  Pulse 82  Temp(Src) 98 F (36.7 C) (Oral)  Resp 16  Ht 5\' 2"  (1.575 m)  Wt 75.524 kg (166 lb 8 oz)  BMI 30.45 kg/m2  SpO2 92%  Physical Exam:  on bed comfortably not on respiratory distress or shortness of breath, neck supple with no lymphadenopathy, no masses no thyromegaly Heart S1 and S2 with no added sounds Lungs normal vesicular breathing with equal air entry Abdomen soft nontender bowel sounds present Extremities without edema peripheral  pulses intact   Disposition:home   Follow-up Appts: Discharge Orders    Future Orders Please Complete By Expires   Diet - low sodium heart healthy      Scheduling Instructions:   LOW FAT DIET , MANY SNACKS   Increase activity slowly         Follow-up with Dr. weeks.   Tests Needing Follow-up:none   Signed: [name]. 07/18/2011, 10:30 AM

## 2011-07-18 NOTE — Progress Notes (Signed)
Pt discharged home in stable condition.  Pain more tolerable now with PO pain meds.  Pt instructed to follow up with PCP and on new medications.  Pt verbalizes understanding.

## 2011-08-15 ENCOUNTER — Emergency Department (HOSPITAL_COMMUNITY)
Admission: EM | Admit: 2011-08-15 | Discharge: 2011-08-16 | Disposition: A | Discharge status: Home / Self Care | Payer: Self-pay | Attending: Emergency Medicine | Admitting: Emergency Medicine

## 2011-08-15 ENCOUNTER — Encounter (HOSPITAL_COMMUNITY): Payer: Self-pay

## 2011-08-15 DIAGNOSIS — R109 Unspecified abdominal pain: Secondary | ICD-10-CM | POA: Insufficient documentation

## 2011-08-15 DIAGNOSIS — F329 Major depressive disorder, single episode, unspecified: Secondary | ICD-10-CM | POA: Insufficient documentation

## 2011-08-15 DIAGNOSIS — J45909 Unspecified asthma, uncomplicated: Secondary | ICD-10-CM | POA: Insufficient documentation

## 2011-08-15 DIAGNOSIS — R110 Nausea: Secondary | ICD-10-CM | POA: Insufficient documentation

## 2011-08-15 DIAGNOSIS — K861 Other chronic pancreatitis: Secondary | ICD-10-CM | POA: Insufficient documentation

## 2011-08-15 DIAGNOSIS — F3289 Other specified depressive episodes: Secondary | ICD-10-CM | POA: Insufficient documentation

## 2011-08-15 DIAGNOSIS — Z79899 Other long term (current) drug therapy: Secondary | ICD-10-CM | POA: Insufficient documentation

## 2011-08-15 DIAGNOSIS — K859 Acute pancreatitis without necrosis or infection, unspecified: Secondary | ICD-10-CM

## 2011-08-15 LAB — URINALYSIS, ROUTINE W REFLEX MICROSCOPIC
Glucose, UA: NEGATIVE mg/dL
Leukocytes, UA: NEGATIVE
Nitrite: NEGATIVE
Protein, ur: NEGATIVE mg/dL
Specific Gravity, Urine: >1.03 — ABNORMAL HIGH (ref 1.005–1.030)
pH: 6 (ref 5.0–8.0)

## 2011-08-15 MED ORDER — ONDANSETRON HCL 4 MG/2ML IJ SOLN
4.0000 mg | Freq: Once | INTRAMUSCULAR | Status: AC
  Administered 2011-08-16: 4 mg via INTRAVENOUS
  Filled 2011-08-15: qty 2

## 2011-08-15 MED ORDER — HYDROMORPHONE HCL PF 1 MG/ML IJ SOLN
1.0000 mg | Freq: Once | INTRAMUSCULAR | Status: AC
  Administered 2011-08-16: 1 mg via INTRAVENOUS
  Filled 2011-08-15: qty 1

## 2011-08-15 MED ORDER — SODIUM CHLORIDE 0.9 % IV BOLUS (SEPSIS)
1000.0000 mL | Freq: Once | INTRAVENOUS | Status: AC
  Administered 2011-08-16: 50 mL via INTRAVENOUS

## 2011-08-15 NOTE — ED Notes (Signed)
Pt presents with RUQ pain that radiates to back and right side of chest starting today. Pt also c/o headache. Pt states she has Hx of pancreatitis.

## 2011-08-15 NOTE — ED Provider Notes (Signed)
History     CSN: [id number]  Arrival date & time 08/15/11  2234   First MD Initiated Contact with Patient 08/15/11 2323      Chief Complaint  Patient presents with  . Pancreatitis    (Consider location/radiation/quality/duration/timing/severity/associated sxs/prior treatment) HPI Patient presents with complaint of epigastric and right upper quadrant pain radiating to her back. She states that this pain is similar to her prior history of pancreatitis. She has had pain over the past 2-3 days but worse today. She took hydrocodone at home without any relief. She has had nausea but no vomiting. Symptoms are continuous and moderate. She denies any fever or chills or dysuria. She was recently hospitalized in December for pancreatitis and states that she was doing well until the past few days. Palpation and eating solid foods make her symptoms worse. There no other alleviating or modifying factors. There no other associated systemic symptoms.  Past Medical History  Diagnosis Date  . Asthma   . Depression   . Migraine   . Pancreatitis chronic     Past Surgical History  Procedure Date  . Cholecystectomy   . Tonsillectomy   . Abdominal hysterectomy     Family History  Problem Relation Age of Onset  . Asthma Other   . Coronary artery disease Neg Hx     History  Substance Use Topics  . Smoking status: Current Everyday Smoker -- 1.0 packs/day for 15 years    Types: Cigarettes  . Smokeless tobacco: Current User  . Alcohol Use: No    OB History    Grav Para Term Preterm Abortions TAB SAB Ect Mult Living                  Review of Systems ROS reviewed and otherwise negative except for mentioned in HPI  Allergies  Penicillins; Prednisone; and Sulfa antibiotics  Home Medications   Current Outpatient Rx  Name Route Sig Dispense Refill  . ALBUTEROL SULFATE HFA 108 (90 BASE) MCG/ACT IN AERS Inhalation Inhale 2 puffs into the lungs daily as needed. For shortness of breath      . ALBUTEROL SULFATE (2.5 MG/3ML) 0.083% IN NEBU Nebulization Take 2.5 mg by nebulization every 6 (six) hours as needed. For shortness of breath     . AMLODIPINE BESYLATE 10 MG PO TABS Oral Take 10 mg by mouth daily.      Marland Kitchen CITALOPRAM HYDROBROMIDE 40 MG PO TABS Oral Take 40 mg by mouth daily.      Marland Kitchen CLONAZEPAM 2 MG PO TABS Oral Take 2 mg by mouth 3 (three) times daily as needed. anxiety    . DOCUSATE SODIUM 100 MG PO CAPS Oral Take 100 mg by mouth at bedtime.      Marland Kitchen HYDROCODONE-ACETAMINOPHEN 10-650 MG PO TABS Oral Take 1 tablet by mouth daily as needed. For pain 30 tablet 0  . PANCRELIPASE (LIP-PROT-AMYL) 12000 UNITS PO CPEP Oral Take 2 capsules by mouth 3 (three) times daily before meals. 270 capsule 0  . OMEPRAZOLE 20 MG PO CPDR Oral Take 20 mg by mouth 3 (three) times daily.      Marland Kitchen ONDANSETRON HCL 4 MG PO TABS Oral Take 1 tablet (4 mg total) by mouth every 6 (six) hours. 12 tablet 0  . OXYCODONE-ACETAMINOPHEN 5-325 MG PO TABS Oral Take 1-2 tablets by mouth every 6 (six) hours as needed for pain. 15 tablet 0    BP 103/61  Pulse 86  Temp(Src) 97.9 F (36.6 C) (Oral)  Resp 18  Ht 5\' 2"  (1.575 m)  Wt 163 lb (73.936 kg)  BMI 29.81 kg/m2  SpO2 98% Vitals reviewed Physical Exam Physical Examination: General appearance - alert, well appearing, and in no distress Mental status - alert, oriented to person, place, and time Eyes - pupils equal and reactive, no scleral icterus Mouth - mucous membranes moist, pharynx normal without lesions Chest - clear to auscultation, no wheezes, rales or rhonchi, symmetric air entry Heart - normal rate, regular rhythm, normal S1, S2, no murmurs, rubs, clicks or gallops Abdomen - soft, epigastric tenderness- mild, no gaurding or rebound tenderness, nondistended, no masses or organomegaly, normal active bowel sounds Musculoskeletal - no joint tenderness, deformity or swelling Extremities - peripheral pulses normal, no pedal edema, no clubbing or cyanosis, brisk  cap refill Skin - normal coloration and turgor, no rashes  ED Course  Procedures (including critical care time)  1:55 AM pt reassessed, she is feeling some improvement in pain, but continued nausea, she is requesting ice chips.   Her labs are reassuring.  Will give another dose of meds here in the ED and po trial.   Labs Reviewed  URINALYSIS, ROUTINE W REFLEX MICROSCOPIC - Abnormal; Notable for the following:    Specific Gravity, Urine >1.030 (*)    All other components within normal limits  COMPREHENSIVE METABOLIC PANEL - Abnormal; Notable for the following:    Glucose, Bld 120 (*)    All other components within normal limits  CBC  LIPASE, BLOOD   No results found.   1. Pancreatitis   2. Abdominal pain       MDM  Patient with history of pancreatitis presenting with right upper and epigastric pain with nausea. Laboratory values are reassuring with no elevation in her lipase. Her pain is similar to prior episodes of pancreatitis. Pain improved after medications in the ED period patient has tolerated liquids and ice chips in the ED period she continues to have some nausea. She has had a CT scan approximately one month ago so with normal laboratory values I will not proceed with CT scan today. He is advised patient to take clear liquids for the next 24-48 hours. She will be to prescribe pain medication and antibiotics. She was given strict return precautions and is agreeable with the plan for discharge. She is nontoxic and well-hydrated appearing in the ED and will arrange for outpatient followup with her Dr.        Ethelda Chick, MD 08/16/11 [id number]

## 2011-08-15 NOTE — ED Notes (Signed)
Pain / symptoms for about three days but pain most severe tonight - was hospitalized in December for pancreatitis.  Has taken hydrocodone and it is not helping.  Has nausea but no vomiting.

## 2011-08-16 LAB — CBC
HCT: 37.6 % (ref 36.0–46.0)
MCV: 91.9 fL (ref 78.0–100.0)
RBC: 4.09 MIL/uL (ref 3.87–5.11)
RDW: 11.8 % (ref 11.5–15.5)
WBC: 8.4 10*3/uL (ref 4.0–10.5)

## 2011-08-16 LAB — COMPREHENSIVE METABOLIC PANEL
ALT: 17 U/L (ref 0–35)
AST: 17 U/L (ref 0–37)
Albumin: 3.7 g/dL (ref 3.5–5.2)
Alkaline Phosphatase: 62 U/L (ref 39–117)
BUN: 12 mg/dL (ref 6–23)
Chloride: 103 mEq/L (ref 96–112)
Glucose, Bld: 120 mg/dL — ABNORMAL HIGH (ref 70–99)
Potassium: 3.7 mEq/L (ref 3.5–5.1)
Sodium: 138 mEq/L (ref 135–145)
Total Bilirubin: 0.4 mg/dL (ref 0.3–1.2)
Total Protein: 6.4 g/dL (ref 6.0–8.3)

## 2011-08-16 LAB — LIPASE, BLOOD: Lipase: 18 U/L (ref 11–59)

## 2011-08-16 MED ORDER — ONDANSETRON HCL 4 MG PO TABS: 4.0000 mg | ORAL_TABLET | Freq: Four times a day (QID) | ORAL | Status: AC

## 2011-08-16 MED ORDER — PROMETHAZINE HCL 25 MG/ML IJ SOLN
25.0000 mg | Freq: Once | INTRAMUSCULAR | Status: AC
  Administered 2011-08-16: 25 mg via INTRAVENOUS
  Filled 2011-08-16: qty 1

## 2011-08-16 MED ORDER — OXYCODONE-ACETAMINOPHEN 5-325 MG PO TABS: 1.0000 | ORAL_TABLET | Freq: Four times a day (QID) | ORAL | Status: AC | PRN

## 2011-08-16 MED ORDER — HYDROMORPHONE HCL PF 1 MG/ML IJ SOLN
1.0000 mg | Freq: Once | INTRAMUSCULAR | Status: AC
  Administered 2011-08-16: 1 mg via INTRAVENOUS
  Filled 2011-08-16: qty 1

## 2011-08-16 NOTE — ED Notes (Signed)
Patient awakened from deep sleeping, husband in room, d/c instructions and Rx given to him.  Pt up and to BR voided large amount.  DC via wheelchair - states she is not in pain at this time.

## 2011-08-16 NOTE — ED Notes (Signed)
Ambulatory to bathroom. States she is having less pain but is still nauseated.

## 2011-08-16 NOTE — ED Notes (Signed)
Multiple attempts by three nurses to start IV.

## 2011-08-29 ENCOUNTER — Observation Stay (HOSPITAL_COMMUNITY): Payer: Self-pay

## 2011-08-29 ENCOUNTER — Inpatient Hospital Stay (HOSPITAL_COMMUNITY)
Admission: EM | Admit: 2011-08-29 | Discharge: 2011-08-30 | DRG: 392 | Disposition: A | Discharge status: Home / Self Care | Payer: Self-pay | Attending: Internal Medicine | Admitting: Internal Medicine

## 2011-08-29 ENCOUNTER — Encounter (HOSPITAL_COMMUNITY): Payer: Self-pay | Admitting: *Deleted

## 2011-08-29 DIAGNOSIS — F17200 Nicotine dependence, unspecified, uncomplicated: Secondary | ICD-10-CM | POA: Diagnosis present

## 2011-08-29 DIAGNOSIS — K8500 Idiopathic acute pancreatitis without necrosis or infection: Secondary | ICD-10-CM | POA: Diagnosis present

## 2011-08-29 DIAGNOSIS — F3289 Other specified depressive episodes: Secondary | ICD-10-CM | POA: Diagnosis present

## 2011-08-29 DIAGNOSIS — Z9119 Patient's noncompliance with other medical treatment and regimen: Secondary | ICD-10-CM

## 2011-08-29 DIAGNOSIS — R109 Unspecified abdominal pain: Secondary | ICD-10-CM | POA: Diagnosis present

## 2011-08-29 DIAGNOSIS — K861 Other chronic pancreatitis: Secondary | ICD-10-CM | POA: Diagnosis present

## 2011-08-29 DIAGNOSIS — K5900 Constipation, unspecified: Principal | ICD-10-CM | POA: Diagnosis present

## 2011-08-29 DIAGNOSIS — K859 Acute pancreatitis without necrosis or infection, unspecified: Secondary | ICD-10-CM | POA: Diagnosis present

## 2011-08-29 DIAGNOSIS — R112 Nausea with vomiting, unspecified: Secondary | ICD-10-CM | POA: Diagnosis present

## 2011-08-29 DIAGNOSIS — Z79899 Other long term (current) drug therapy: Secondary | ICD-10-CM

## 2011-08-29 DIAGNOSIS — Z91199 Patient's noncompliance with other medical treatment and regimen due to unspecified reason: Secondary | ICD-10-CM

## 2011-08-29 DIAGNOSIS — G43909 Migraine, unspecified, not intractable, without status migrainosus: Secondary | ICD-10-CM | POA: Diagnosis present

## 2011-08-29 DIAGNOSIS — Z9111 Patient's noncompliance with dietary regimen: Secondary | ICD-10-CM

## 2011-08-29 DIAGNOSIS — R1084 Generalized abdominal pain: Secondary | ICD-10-CM | POA: Diagnosis present

## 2011-08-29 DIAGNOSIS — J45909 Unspecified asthma, uncomplicated: Secondary | ICD-10-CM | POA: Diagnosis present

## 2011-08-29 DIAGNOSIS — G8929 Other chronic pain: Secondary | ICD-10-CM

## 2011-08-29 DIAGNOSIS — F329 Major depressive disorder, single episode, unspecified: Secondary | ICD-10-CM | POA: Diagnosis present

## 2011-08-29 DIAGNOSIS — E860 Dehydration: Secondary | ICD-10-CM | POA: Diagnosis present

## 2011-08-29 DIAGNOSIS — I10 Essential (primary) hypertension: Secondary | ICD-10-CM | POA: Diagnosis present

## 2011-08-29 LAB — CBC
HCT: 42.4 % (ref 36.0–46.0)
Hemoglobin: 14.8 g/dL (ref 12.0–15.0)
MCH: 31.5 pg (ref 26.0–34.0)
MCHC: 34.9 g/dL (ref 30.0–36.0)
MCV: 90.2 fL (ref 78.0–100.0)
RDW: 11.4 % — ABNORMAL LOW (ref 11.5–15.5)
WBC: 8.3 10*3/uL (ref 4.0–10.5)

## 2011-08-29 LAB — COMPREHENSIVE METABOLIC PANEL
AST: 14 U/L (ref 0–37)
Albumin: 3.8 g/dL (ref 3.5–5.2)
BUN: 10 mg/dL (ref 6–23)
Calcium: 10.1 mg/dL (ref 8.4–10.5)
Creatinine, Ser: 0.63 mg/dL (ref 0.50–1.10)
GFR calc non Af Amer: >90 mL/min (ref >90)
Total Bilirubin: 0.2 mg/dL — ABNORMAL LOW (ref 0.3–1.2)
Total Protein: 7.1 g/dL (ref 6.0–8.3)

## 2011-08-29 LAB — DIFFERENTIAL
Basophils Absolute: 0 10*3/uL (ref 0.0–0.1)
Basophils Relative: 0 % (ref 0–1)
Eosinophils Absolute: 0.5 10*3/uL (ref 0.0–0.7)
Eosinophils Relative: 6 % — ABNORMAL HIGH (ref 0–5)
Monocytes Absolute: 0.5 10*3/uL (ref 0.1–1.0)
Monocytes Relative: 6 % (ref 3–12)

## 2011-08-29 LAB — URINALYSIS, ROUTINE W REFLEX MICROSCOPIC
Glucose, UA: NEGATIVE mg/dL
Leukocytes, UA: NEGATIVE
Nitrite: NEGATIVE
Protein, ur: NEGATIVE mg/dL
Specific Gravity, Urine: >1.03 — ABNORMAL HIGH (ref 1.005–1.030)
pH: 5.5 (ref 5.0–8.0)

## 2011-08-29 LAB — LIPASE, BLOOD: Lipase: 30 U/L (ref 11–59)

## 2011-08-29 LAB — RAPID URINE DRUG SCREEN, HOSP PERFORMED
Amphetamines: NOT DETECTED
Benzodiazepines: POSITIVE — AB
Cocaine: NOT DETECTED
Opiates: POSITIVE — AB

## 2011-08-29 MED ORDER — AMLODIPINE BESYLATE 5 MG PO TABS: 10.0000 mg | ORAL_TABLET | Freq: Every day | ORAL | Status: DC

## 2011-08-29 MED ORDER — SODIUM CHLORIDE 0.9 % IV SOLN
INTRAVENOUS | Status: DC
  Administered 2011-08-29: 12:00:00 via INTRAVENOUS

## 2011-08-29 MED ORDER — SODIUM CHLORIDE 0.9 % IV BOLUS (SEPSIS)
1000.0000 mL | Freq: Once | INTRAVENOUS | Status: AC
  Administered 2011-08-29: 1000 mL via INTRAVENOUS

## 2011-08-29 MED ORDER — ACETAMINOPHEN 650 MG RE SUPP: 650.0000 mg | Freq: Four times a day (QID) | RECTAL | Status: DC | PRN

## 2011-08-29 MED ORDER — SERTRALINE HCL 50 MG PO TABS
50.0000 mg | ORAL_TABLET | Freq: Every day | ORAL | Status: DC
  Administered 2011-08-30: 50 mg via ORAL
  Filled 2011-08-29 (×5): qty 1

## 2011-08-29 MED ORDER — ACETAMINOPHEN 325 MG PO TABS: 650.0000 mg | ORAL_TABLET | Freq: Four times a day (QID) | ORAL | Status: DC | PRN

## 2011-08-29 MED ORDER — ONDANSETRON HCL 4 MG PO TABS: 4.0000 mg | ORAL_TABLET | Freq: Four times a day (QID) | ORAL | Status: DC | PRN

## 2011-08-29 MED ORDER — MORPHINE SULFATE 4 MG/ML IJ SOLN
4.0000 mg | INTRAMUSCULAR | Status: DC | PRN
  Administered 2011-08-29 – 2011-08-30 (×6): 4 mg via INTRAVENOUS
  Filled 2011-08-29 (×6): qty 1

## 2011-08-29 MED ORDER — MORPHINE SULFATE 2 MG/ML IJ SOLN: 2.0000 mg | INTRAMUSCULAR | Status: DC | PRN

## 2011-08-29 MED ORDER — HYDROCODONE-ACETAMINOPHEN 10-325 MG PO TABS
1.0000 | ORAL_TABLET | Freq: Four times a day (QID) | ORAL | Status: DC | PRN
  Administered 2011-08-30: 1 via ORAL
  Administered 2011-08-30: 2 via ORAL
  Filled 2011-08-29: qty 2
  Filled 2011-08-29: qty 1

## 2011-08-29 MED ORDER — CLONAZEPAM 0.5 MG PO TABS
0.5000 mg | ORAL_TABLET | Freq: Three times a day (TID) | ORAL | Status: DC | PRN
  Administered 2011-08-29: 0.5 mg via ORAL
  Filled 2011-08-29: qty 1

## 2011-08-29 MED ORDER — CARBAMAZEPINE 200 MG PO TABS
200.0000 mg | ORAL_TABLET | Freq: Two times a day (BID) | ORAL | Status: DC
  Administered 2011-08-29 – 2011-08-30 (×2): 200 mg via ORAL
  Filled 2011-08-29 (×2): qty 1

## 2011-08-29 MED ORDER — POLYETHYLENE GLYCOL 3350 17 G PO PACK
17.0000 g | PACK | Freq: Every day | ORAL | Status: DC
  Administered 2011-08-29 – 2011-08-30 (×2): 17 g via ORAL
  Filled 2011-08-29 (×2): qty 1

## 2011-08-29 MED ORDER — ONDANSETRON HCL 4 MG/2ML IJ SOLN
4.0000 mg | Freq: Four times a day (QID) | INTRAMUSCULAR | Status: DC | PRN
  Administered 2011-08-29 – 2011-08-30 (×2): 4 mg via INTRAVENOUS
  Filled 2011-08-29 (×2): qty 2

## 2011-08-29 MED ORDER — ALBUTEROL SULFATE (5 MG/ML) 0.5% IN NEBU
2.5000 mg | INHALATION_SOLUTION | Freq: Four times a day (QID) | RESPIRATORY_TRACT | Status: DC | PRN
  Administered 2011-08-30: 2.5 mg via RESPIRATORY_TRACT
  Filled 2011-08-29: qty 0.5

## 2011-08-29 MED ORDER — SODIUM CHLORIDE 0.9 % IV SOLN
INTRAVENOUS | Status: DC
  Administered 2011-08-29: 19:00:00 via INTRAVENOUS

## 2011-08-29 MED ORDER — DOCUSATE SODIUM 100 MG PO CAPS
100.0000 mg | ORAL_CAPSULE | Freq: Every day | ORAL | Status: DC
  Administered 2011-08-29: 100 mg via ORAL
  Filled 2011-08-29 (×3): qty 1

## 2011-08-29 MED ORDER — ENOXAPARIN SODIUM 40 MG/0.4ML ~~LOC~~ SOLN
40.0000 mg | SUBCUTANEOUS | Status: DC
  Filled 2011-08-29: qty 0.4

## 2011-08-29 MED ORDER — PANTOPRAZOLE SODIUM 40 MG IV SOLR
40.0000 mg | INTRAVENOUS | Status: DC
  Administered 2011-08-29: 40 mg via INTRAVENOUS
  Filled 2011-08-29: qty 40

## 2011-08-29 MED ORDER — FLEET ENEMA 7-19 GM/118ML RE ENEM
1.0000 | ENEMA | Freq: Once | RECTAL | Status: AC
  Administered 2011-08-29: 1 via RECTAL

## 2011-08-29 MED ORDER — CITALOPRAM HYDROBROMIDE 20 MG PO TABS
20.0000 mg | ORAL_TABLET | Freq: Every day | ORAL | Status: DC
  Administered 2011-08-30: 20 mg via ORAL
  Filled 2011-08-29 (×5): qty 1

## 2011-08-29 MED ORDER — ALBUTEROL SULFATE HFA 108 (90 BASE) MCG/ACT IN AERS: 2.0000 | INHALATION_SPRAY | Freq: Every day | RESPIRATORY_TRACT | Status: DC | PRN

## 2011-08-29 MED ORDER — SODIUM CHLORIDE 0.9 % IV SOLN: INTRAVENOUS | Status: DC

## 2011-08-29 MED ORDER — ONDANSETRON HCL 4 MG/2ML IJ SOLN
4.0000 mg | Freq: Four times a day (QID) | INTRAMUSCULAR | Status: DC | PRN
  Administered 2011-08-29: 4 mg via INTRAVENOUS
  Filled 2011-08-29: qty 2

## 2011-08-29 MED ORDER — ONDANSETRON HCL 4 MG/2ML IJ SOLN: 4.0000 mg | INTRAMUSCULAR | Status: AC | PRN

## 2011-08-29 MED ORDER — MORPHINE SULFATE 4 MG/ML IJ SOLN
4.0000 mg | INTRAMUSCULAR | Status: DC | PRN
  Administered 2011-08-29 (×2): 4 mg via INTRAVENOUS
  Filled 2011-08-29 (×2): qty 1

## 2011-08-29 NOTE — ED Notes (Signed)
Pt states chronic pancreatitis. No better since last visit. Last vomited this morning. States PMD recently put her on Ax for ear infection also. NAD.

## 2011-08-29 NOTE — ED Provider Notes (Signed)
History     CSN: [id number]  Arrival date & time 08/29/11  1034   First MD Initiated Contact with Patient 08/29/11 1133      Chief Complaint  Patient presents with  . Abdominal Pain    (Consider location/radiation/quality/duration/timing/severity/associated sxs/prior treatment) HPI  Patient has a history of chronic pancreatitis and is followed by Dr. Margaretha Glassing at [facility] and she states she sees him about every 6 months and he does a pancreas block which can asked a month or several months. She relates last time she saw him was about 7 months ago and states the block didn't work. Patient was at last visit here in December had a CT that did not show acute inflammatory changes or pancreas. She relates she has had pain since she left the hospital. She still states she felt like she was discharged too soon. Patient states she does not take her Creon because it's too expensive. She also states she was told to avoid anything red to eat however she's been eating lasagna recently. She also relates she was told not to drink alcohol however she states she does drink occasionally last time was a few weeks ago. She states she finds that when she's under stress her pain gets worse. She was seen 2 days ago by her doctor in the office and he is tapering her off her Celexa and starting Zoloft patient describes nausea, vomiting without diarrhea she denies fever. When she was seen 2 days ago she had a left ear infection and was started on a Z-Pak which she thinks may have made her pain worse. She relates she has a lot of epigastric pain and then some diffuse pain also.  PCP Dr. Carlena Sax  Past Medical History  Diagnosis Date  . Asthma   . Depression   . Migraine   . Pancreatitis chronic     Past Surgical History  Procedure Date  . Cholecystectomy   . Tonsillectomy   . Abdominal hysterectomy     Family History  Problem Relation Age of Onset  . Asthma Other   . Coronary artery disease Neg Hx      History  Substance Use Topics  . Smoking status: Current Everyday Smoker -- 1.0 packs/day for 15 years    Types: Cigarettes  . Smokeless tobacco: Current User  . Alcohol Use: No   lives with spouse States her son is living with her and he is going through a separation she states she has to babysit HER-43-year-old grandchild and "I have a lot of medical problems"  OB History    Grav Para Term Preterm Abortions TAB SAB Ect Mult Living                  Review of Systems  All other systems reviewed and are negative.    Allergies  Penicillins; Prednisone; and Sulfa antibiotics  Home Medications   Current Outpatient Rx  Name Route Sig Dispense Refill  . ALBUTEROL SULFATE HFA 108 (90 BASE) MCG/ACT IN AERS Inhalation Inhale 2 puffs into the lungs daily as needed. For shortness of breath    . ALBUTEROL SULFATE (2.5 MG/3ML) 0.083% IN NEBU Nebulization Take 2.5 mg by nebulization every 6 (six) hours as needed. For shortness of breath     . AMLODIPINE BESYLATE 10 MG PO TABS Oral Take 10 mg by mouth daily.      . AZITHROMYCIN 250 MG PO TABS Oral Take 250 mg by mouth daily. Z-Pak prescribed on 08/27/11, patient  has 2 doses remaining    . CARBAMAZEPINE 200 MG PO TABS Oral Take 200 mg by mouth 2 (two) times daily.    Marland Kitchen CITALOPRAM HYDROBROMIDE 20 MG PO TABS Oral Take 20 mg by mouth daily.    Marland Kitchen CLONAZEPAM 0.5 MG PO TABS Oral Take 0.5 mg by mouth 3 (three) times daily as needed.    Marland Kitchen DOCUSATE SODIUM 100 MG PO CAPS Oral Take 100 mg by mouth at bedtime.      Marland Kitchen HYDROCODONE-ACETAMINOPHEN 10-325 MG PO TABS Oral Take 1 tablet by mouth every 6 (six) hours as needed. pain    . OMEPRAZOLE 20 MG PO CPDR Oral Take 20 mg by mouth 3 (three) times daily.      . SERTRALINE HCL 100 MG PO TABS Oral Take 50 mg by mouth daily.      BP 130/88  Pulse 85  Temp(Src) 97.3 F (36.3 C) (Oral)  Resp 18  Ht 5\' 2"  (1.575 m)  Wt 161 lb (73.029 kg)  BMI 29.45 kg/m2  SpO2 96%  Physical Exam  Nursing note and  vitals reviewed. Constitutional: She is oriented to person, place, and time. She appears well-developed and well-nourished.  Non-toxic appearance. She does not appear ill. No distress.  HENT:  Head: Normocephalic and atraumatic.  Right Ear: External ear normal.  Left Ear: External ear normal.  Nose: Nose normal. No mucosal edema or rhinorrhea.  Mouth/Throat: Mucous membranes are normal. No dental abscesses or uvula swelling.       Mucous membranes are dry  Eyes: Conjunctivae and EOM are normal. Pupils are equal, round, and reactive to light.  Neck: Normal range of motion and full passive range of motion without pain. Neck supple.  Cardiovascular: Normal rate, regular rhythm and normal heart sounds.  Exam reveals no gallop and no friction rub.   No murmur heard. Pulmonary/Chest: Effort normal and breath sounds normal. No respiratory distress. She has no wheezes. She has no rhonchi. She has no rales. She exhibits no tenderness and no crepitus.  Abdominal: Soft. Normal appearance and bowel sounds are normal. She exhibits no distension. There is no tenderness. There is no rebound and no guarding.       I examined the patient's abdomen while she is talking she had no apparent discomfort to palpation  Musculoskeletal: Normal range of motion. She exhibits no edema and no tenderness.       Moves all extremities well.   Neurological: She is alert and oriented to person, place, and time. She has normal strength. No cranial nerve deficit.  Skin: Skin is warm, dry and intact. No rash noted. No erythema. No pallor.  Psychiatric: Her speech is normal and behavior is normal. Her mood appears not anxious.       Very flat affect    ED Course  Procedures (including critical care time)   Medications  0.9 %  sodium chloride infusion (  Intravenous New Bag/Given 08/29/11 1216)  ondansetron (ZOFRAN) injection 4 mg (4 mg Intravenous Given 08/29/11 1216)  morphine 4 MG/ML injection 4 mg (4 mg Intravenous Given  08/29/11 1526)  sodium chloride 0.9 % bolus 1,000 mL (1000 mL Intravenous Given 08/29/11 1216)    Patient relates her pain is not improving. She feels  that she needs to come in the hospital and be admitted.  15:27 Dr Gwendalyn Ege admit to med-surg team 2  Results for orders placed during the hospital encounter of 08/29/11  CBC      Component Value Range  WBC 8.3  4.0 - 10.5 (K/uL)   RBC 4.70  3.87 - 5.11 (MIL/uL)   Hemoglobin 14.8  12.0 - 15.0 (g/dL)   HCT 16.1  09.6 - 04.5 (%)   MCV 90.2  78.0 - 100.0 (fL)   MCH 31.5  26.0 - 34.0 (pg)   MCHC 34.9  30.0 - 36.0 (g/dL)   RDW 40.9 (*) 81.1 - 15.5 (%)   Platelets 234  150 - 400 (K/uL)  DIFFERENTIAL      Component Value Range   Neutrophils Relative 60  43 - 77 (%)   Neutro Abs 4.9  1.7 - 7.7 (K/uL)   Lymphocytes Relative 28  12 - 46 (%)   Lymphs Abs 2.3  0.7 - 4.0 (K/uL)   Monocytes Relative 6  3 - 12 (%)   Monocytes Absolute 0.5  0.1 - 1.0 (K/uL)   Eosinophils Relative 6 (*) 0 - 5 (%)   Eosinophils Absolute 0.5  0.0 - 0.7 (K/uL)   Basophils Relative 0  0 - 1 (%)   Basophils Absolute 0.0  0.0 - 0.1 (K/uL)  COMPREHENSIVE METABOLIC PANEL      Component Value Range   Sodium 138  135 - 145 (mEq/L)   Potassium 3.7  3.5 - 5.1 (mEq/L)   Chloride 102  96 - 112 (mEq/L)   CO2 28  19 - 32 (mEq/L)   Glucose, Bld 85  70 - 99 (mg/dL)   BUN 10  6 - 23 (mg/dL)   Creatinine, Ser 9.14  0.50 - 1.10 (mg/dL)   Calcium 78.2  8.4 - 10.5 (mg/dL)   Total Protein 7.1  6.0 - 8.3 (g/dL)   Albumin 3.8  3.5 - 5.2 (g/dL)   AST 14  0 - 37 (U/L)   ALT 12  0 - 35 (U/L)   Alkaline Phosphatase 66  39 - 117 (U/L)   Total Bilirubin 0.2 (*) 0.3 - 1.2 (mg/dL)   GFR calc non Af Amer >90  >90 (mL/min)   GFR calc Af Amer >90  >90 (mL/min)  LIPASE, BLOOD      Component Value Range   Lipase 30  11 - 59 (U/L)  URINALYSIS, ROUTINE W REFLEX MICROSCOPIC      Component Value Range   Color, Urine YELLOW  YELLOW    APPearance CLEAR  CLEAR    Specific Gravity, Urine >1.030  (*) 1.005 - 1.030    pH 5.5  5.0 - 8.0    Glucose, UA NEGATIVE  NEGATIVE (mg/dL)   Hgb urine dipstick NEGATIVE  NEGATIVE    Bilirubin Urine NEGATIVE  NEGATIVE    Ketones, ur NEGATIVE  NEGATIVE (mg/dL)   Protein, ur NEGATIVE  NEGATIVE (mg/dL)   Urobilinogen, UA 0.2  0.0 - 1.0 (mg/dL)   Nitrite NEGATIVE  NEGATIVE    Leukocytes, UA NEGATIVE  NEGATIVE   URINE RAPID DRUG SCREEN (HOSP PERFORMED)      Component Value Range   Opiates POSITIVE (*) NONE DETECTED    Cocaine NONE DETECTED  NONE DETECTED    Benzodiazepines POSITIVE (*) NONE DETECTED    Amphetamines NONE DETECTED  NONE DETECTED    Tetrahydrocannabinol POSITIVE (*) NONE DETECTED    Barbiturates NONE DETECTED  NONE DETECTED    Laboratory interpretation all normal except concentrated urine, + drug screen    Diagnoses that have been ruled out:  None  Diagnoses that are still under consideration:  None  Final diagnoses:  Chronic abdominal pain  Dehydration  Noncompliance of patient  with dietary regimen   Plan admission  Devoria Albe, MD, FACEP    MDM          Ward Givens, MD 08/29/11 1540

## 2011-08-29 NOTE — H&P (Signed)
PCP:   Cassell Smiles., MD, MD   Chief Complaint:  Abdominal pain  HPI: This is a 41 year old female with chronic pancreatitis, who has had recurrent admissions for abdominal pain. She reports to the emergency room today with complaints of epigastric pain, nausea, vomiting. She reports these symptoms have been occurring for approximately 2 weeks now. She was initially seen in the emergency department approximately 2 weeks ago for similar complaints. She was sent home at that time with pain medication as well as plan to start a clear liquid diet. She reports that her pain has been persisting, and since then it has gradually gotten worse. She denies any fevers, but felt that she has had chills. She reports that she has not had a bowel movement in approximately a week. She is passing flatus. She reports the pain is epigastric in nature but her whole abdomen feels sore. She's been vomiting after she tries to eat anything. Her pain also gets worse after trying to eat. Patient has chronic pancreatitis and is followed at [facility]. She receives nerve blocks for her chronic pancreatitis. She is also s/p cholecystectomy in the past. She denies any recent alcohol consumption. She was evaluated in the emergency room here. It was felt the patient would not be able to tolerate outpatient therapy since she is unable to keep any medicine down. She's been referred for admission.  Allergies:   Allergies  Allergen Reactions  . Penicillins     REACTION: Unknown reaction  . Prednisone     pancreatitis  . Sulfa Antibiotics       Past Medical History  Diagnosis Date  . Asthma   . Depression   . Migraine   . Pancreatitis chronic     Past Surgical History  Procedure Date  . Cholecystectomy   . Tonsillectomy   . Abdominal hysterectomy     Prior to Admission medications   Medication Sig Start Date End Date Taking? Authorizing Provider  albuterol (PROVENTIL HFA;VENTOLIN HFA) 108 (90 BASE) MCG/ACT  inhaler Inhale 2 puffs into the lungs daily as needed. For shortness of breath   Yes Historical Provider, MD  albuterol (PROVENTIL) (2.5 MG/3ML) 0.083% nebulizer solution Take 2.5 mg by nebulization every 6 (six) hours as needed. For shortness of breath    Yes Historical Provider, MD  amLODipine (NORVASC) 10 MG tablet Take 10 mg by mouth daily.     Yes Historical Provider, MD  azithromycin (ZITHROMAX) 250 MG tablet Take 250 mg by mouth daily. Z-Pak prescribed on 08/27/11, patient has 2 doses remaining   Yes Historical Provider, MD  carbamazepine (EPITOL) 200 MG tablet Take 200 mg by mouth 2 (two) times daily.   Yes Historical Provider, MD  citalopram (CELEXA) 20 MG tablet Take 20 mg by mouth daily.   Yes Historical Provider, MD  clonazePAM (KLONOPIN) 0.5 MG tablet Take 0.5 mg by mouth 3 (three) times daily as needed.   Yes Historical Provider, MD  docusate sodium (COLACE) 100 MG capsule Take 100 mg by mouth at bedtime.     Yes Historical Provider, MD  HYDROcodone-acetaminophen (NORCO) 10-325 MG per tablet Take 1 tablet by mouth every 6 (six) hours as needed. pain   Yes Historical Provider, MD  omeprazole (PRILOSEC) 20 MG capsule Take 20 mg by mouth 3 (three) times daily.     Yes Historical Provider, MD  sertraline (ZOLOFT) 100 MG tablet Take 50 mg by mouth daily.   Yes Historical Provider, MD    Social History:  reports  that she has been smoking Cigarettes.  She has a 15 pack-year smoking history. She uses smokeless tobacco. She reports that she does not drink alcohol or use illicit drugs.  Family History  Problem Relation Age of Onset  . Asthma Other   . Coronary artery disease Neg Hx     Review of Systems: Positives in bold. Constitutional: Denies fever, chills, diaphoresis, appetite change and fatigue.  HEENT: Denies photophobia, eye pain, redness, hearing loss, ear pain, congestion, sore throat, rhinorrhea, sneezing, mouth sores, trouble swallowing, neck pain, neck stiffness and tinnitus.    Respiratory: Denies SOB, DOE, cough, chest tightness,  and wheezing.   Cardiovascular: Denies chest pain, palpitations and leg swelling.  Gastrointestinal: Denies nausea, vomiting, abdominal pain, diarrhea, constipation, blood in stool and abdominal distention.  Genitourinary: Denies dysuria, urgency, frequency, hematuria, flank pain and difficulty urinating.  Musculoskeletal: Denies myalgias, back pain, joint swelling, arthralgias and gait problem.  Skin: Denies pallor, rash and wound.  Neurological: Denies dizziness, seizures, syncope, weakness, light-headedness, numbness and headaches.  Hematological: Denies adenopathy. Easy bruising, personal or family bleeding history  Psychiatric/Behavioral: Denies suicidal ideation, mood changes, confusion, nervousness, sleep disturbance and agitation   Physical Exam: Blood pressure 97/73, pulse 89, temperature 97.3 F (36.3 C), temperature source Oral, resp. rate 18, height 5\' 2"  (1.575 m), weight 73.029 kg (161 lb), SpO2 95.00%. General: Patient in no acute distress, lying in bed, recently medicated HEENT: Normocephalic, atraumatic, pupils are equal/round and reactive to light Chest: Clear to auscultation bilaterally Cardiac: S1, S2, regular rate and rhythm Abdomen: Soft,  tender in the epigastric region, no rigidity or guarding, bowel sounds are active Extremities: No cyanosis, clubbing, or edema Neurologic: Grossly intact, nonfocal  Labs on Admission:  Results for orders placed during the hospital encounter of 08/29/11 (from the past 48 hour(s))  CBC     Status: Abnormal   Collection Time   08/29/11 11:58 AM      Component Value Range Comment   WBC 8.3  4.0 - 10.5 (K/uL)    RBC 4.70  3.87 - 5.11 (MIL/uL)    Hemoglobin 14.8  12.0 - 15.0 (g/dL)    HCT 91.4  78.2 - 95.6 (%)    MCV 90.2  78.0 - 100.0 (fL)    MCH 31.5  26.0 - 34.0 (pg)    MCHC 34.9  30.0 - 36.0 (g/dL)    RDW 21.3 (*) 08.6 - 15.5 (%)    Platelets 234  150 - 400 (K/uL)     DIFFERENTIAL     Status: Abnormal   Collection Time   08/29/11 11:58 AM      Component Value Range Comment   Neutrophils Relative 60  43 - 77 (%)    Neutro Abs 4.9  1.7 - 7.7 (K/uL)    Lymphocytes Relative 28  12 - 46 (%)    Lymphs Abs 2.3  0.7 - 4.0 (K/uL)    Monocytes Relative 6  3 - 12 (%)    Monocytes Absolute 0.5  0.1 - 1.0 (K/uL)    Eosinophils Relative 6 (*) 0 - 5 (%)    Eosinophils Absolute 0.5  0.0 - 0.7 (K/uL)    Basophils Relative 0  0 - 1 (%)    Basophils Absolute 0.0  0.0 - 0.1 (K/uL)   COMPREHENSIVE METABOLIC PANEL     Status: Abnormal   Collection Time   08/29/11 11:58 AM      Component Value Range Comment   Sodium 138  135 -  145 (mEq/L)    Potassium 3.7  3.5 - 5.1 (mEq/L)    Chloride 102  96 - 112 (mEq/L)    CO2 28  19 - 32 (mEq/L)    Glucose, Bld 85  70 - 99 (mg/dL)    BUN 10  6 - 23 (mg/dL)    Creatinine, Ser 6.57  0.50 - 1.10 (mg/dL)    Calcium 84.6  8.4 - 10.5 (mg/dL)    Total Protein 7.1  6.0 - 8.3 (g/dL)    Albumin 3.8  3.5 - 5.2 (g/dL)    AST 14  0 - 37 (U/L)    ALT 12  0 - 35 (U/L)    Alkaline Phosphatase 66  39 - 117 (U/L)    Total Bilirubin 0.2 (*) 0.3 - 1.2 (mg/dL)    GFR calc non Af Amer >90  >90 (mL/min)    GFR calc Af Amer >90  >90 (mL/min)   LIPASE, BLOOD     Status: Normal   Collection Time   08/29/11 11:58 AM      Component Value Range Comment   Lipase 30  11 - 59 (U/L)   URINALYSIS, ROUTINE W REFLEX MICROSCOPIC     Status: Abnormal   Collection Time   08/29/11 12:02 PM      Component Value Range Comment   Color, Urine YELLOW  YELLOW     APPearance CLEAR  CLEAR     Specific Gravity, Urine >1.030 (*) 1.005 - 1.030     pH 5.5  5.0 - 8.0     Glucose, UA NEGATIVE  NEGATIVE (mg/dL)    Hgb urine dipstick NEGATIVE  NEGATIVE     Bilirubin Urine NEGATIVE  NEGATIVE     Ketones, ur NEGATIVE  NEGATIVE (mg/dL)    Protein, ur NEGATIVE  NEGATIVE (mg/dL)    Urobilinogen, UA 0.2  0.0 - 1.0 (mg/dL)    Nitrite NEGATIVE  NEGATIVE     Leukocytes, UA  NEGATIVE  NEGATIVE  MICROSCOPIC NOT DONE ON URINES WITH NEGATIVE PROTEIN, BLOOD, LEUKOCYTES, NITRITE, OR GLUCOSE <1000 mg/dL.  URINE RAPID DRUG SCREEN (HOSP PERFORMED)     Status: Abnormal   Collection Time   08/29/11 12:02 PM      Component Value Range Comment   Opiates POSITIVE (*) NONE DETECTED     Cocaine NONE DETECTED  NONE DETECTED     Benzodiazepines POSITIVE (*) NONE DETECTED     Amphetamines NONE DETECTED  NONE DETECTED     Tetrahydrocannabinol POSITIVE (*) NONE DETECTED     Barbiturates NONE DETECTED  NONE DETECTED      Radiological Exams on Admission: No results found.  Assessment/Plan Principal Problem:  *ABDOMINAL PAIN Active Problems:  HYPERTENSION  ASTHMA  Pancreatitis chronic  Nausea & vomiting  Acute pancreatitis  Constipation  Plan:  Patient will be admitted for supportive treatment. With her history of chronic pancreatitis, It is possible that she could be having an episode of acute pancreatitis, and no longer be able to mount an elevated lipase. She'll be started on clear liquids, given antiemetics, pain management. We'll also check an acute abdominal series to rule out any obstructive process. She will be started on a bowel regimen. We'll continue the remainder of her outpatient medications. We are hopeful for discharge home in the next 24-48 hours  Time Spent on Admission:  MEMON,JEHANZEB [facility] Pager: [phone] 08/29/2011, 4:48 PM

## 2011-08-30 LAB — BASIC METABOLIC PANEL
BUN: 7 mg/dL (ref 6–23)
CO2: 29 mEq/L (ref 19–32)
Calcium: 8.7 mg/dL (ref 8.4–10.5)
Creatinine, Ser: 0.74 mg/dL (ref 0.50–1.10)
GFR calc non Af Amer: >90 mL/min (ref >90)
Glucose, Bld: 102 mg/dL — ABNORMAL HIGH (ref 70–99)
Sodium: 140 mEq/L (ref 135–145)

## 2011-08-30 LAB — CBC
MCH: 31.3 pg (ref 26.0–34.0)
MCHC: 33.8 g/dL (ref 30.0–36.0)
Platelets: 203 10*3/uL (ref 150–400)
RBC: 4.16 MIL/uL (ref 3.87–5.11)
RDW: 11.6 % (ref 11.5–15.5)

## 2011-08-30 MED ORDER — HYDROCODONE-ACETAMINOPHEN 10-325 MG PO TABS: 1.0000 | ORAL_TABLET | Freq: Four times a day (QID) | ORAL | Status: DC | PRN

## 2011-08-30 MED ORDER — POLYETHYLENE GLYCOL 3350 17 G PO PACK: 17.0000 g | PACK | Freq: Every day | ORAL | Status: AC

## 2011-08-30 MED ORDER — ALBUTEROL SULFATE (5 MG/ML) 0.5% IN NEBU: 2.5000 mg | INHALATION_SOLUTION | RESPIRATORY_TRACT | Status: DC | PRN

## 2011-08-30 MED ORDER — ONDANSETRON HCL 4 MG PO TABS: 4.0000 mg | ORAL_TABLET | Freq: Four times a day (QID) | ORAL | Status: AC | PRN

## 2011-08-30 MED ORDER — ALBUTEROL SULFATE (5 MG/ML) 0.5% IN NEBU
2.5000 mg | INHALATION_SOLUTION | Freq: Four times a day (QID) | RESPIRATORY_TRACT | Status: DC
  Administered 2011-08-30: 2.5 mg via RESPIRATORY_TRACT
  Filled 2011-08-30: qty 0.5

## 2011-08-30 NOTE — Progress Notes (Signed)
Patient stated that she was having some trouble breathing. Respiratory was notified and PRN neb treatment was given by RT. Will continue to monitor.

## 2011-08-30 NOTE — Progress Notes (Signed)
Patient has had max dose of morphine and is still in pain. Doctor was notified. Doctor stated to try the two of the oral Norco tablets. Will continue to monitor.

## 2011-08-30 NOTE — Discharge Summary (Signed)
Physician Discharge Summary  Patient ID: [name] MRN: [id number] DOB/AGE: Nov 10, 1970 41 y.o.  Admit date: 08/29/2011 Discharge date: 08/30/2011  Primary Care Physician:  Cassell Smiles., MD, MD   Discharge Diagnoses:    Principal Problem:  *ABDOMINAL PAIN Active Problems:  HYPERTENSION  ASTHMA  Pancreatitis chronic  Nausea & vomiting  Acute pancreatitis  Constipation    Medication List  As of 08/30/2011 12:00 PM   TAKE these medications         albuterol 108 (90 BASE) MCG/ACT inhaler   Commonly known as: PROVENTIL HFA;VENTOLIN HFA   Inhale 2 puffs into the lungs daily as needed. For shortness of breath      albuterol (2.5 MG/3ML) 0.083% nebulizer solution   Commonly known as: PROVENTIL   Take 2.5 mg by nebulization every 6 (six) hours as needed. For shortness of breath      amLODipine 10 MG tablet   Commonly known as: NORVASC   Take 10 mg by mouth daily.      azithromycin 250 MG tablet   Commonly known as: ZITHROMAX   Take 250 mg by mouth daily. Z-Pak prescribed on 08/27/11, patient has 2 doses remaining      citalopram 20 MG tablet   Commonly known as: CELEXA   Take 20 mg by mouth daily.      clonazePAM 0.5 MG tablet   Commonly known as: KLONOPIN   Take 0.5 mg by mouth 3 (three) times daily as needed.      docusate sodium 100 MG capsule   Commonly known as: COLACE   Take 100 mg by mouth at bedtime.      EPITOL 200 MG tablet   Generic drug: carbamazepine   Take 200 mg by mouth 2 (two) times daily.      HYDROcodone-acetaminophen 10-325 MG per tablet   Commonly known as: NORCO   Take 1 tablet by mouth every 6 (six) hours as needed for pain. pain      omeprazole 20 MG capsule   Commonly known as: PRILOSEC   Take 20 mg by mouth 3 (three) times daily.      ondansetron 4 MG tablet   Commonly known as: ZOFRAN   Take 1 tablet (4 mg total) by mouth every 6 (six) hours as needed for nausea.      polyethylene glycol packet   Commonly known as:  MIRALAX / GLYCOLAX   Take 17 g by mouth daily.      sertraline 100 MG tablet   Commonly known as: ZOLOFT   Take 50 mg by mouth daily.           Discharge Exam: Blood pressure 104/69, pulse 87, temperature 97.7 F (36.5 C), temperature source Oral, resp. rate 20, height 5\' 2"  (1.575 m), weight 73 kg (160 lb 15 oz), SpO2 91.00%. NAD CTA B S1, S2, RRR Soft, BS+ No edema b/l  Disposition and Follow-up:  Follow up with primary doctor as scheduled  Consults:  none   Significant Diagnostic Studies:  Acute Abdominal Series  08/29/2011  *RADIOLOGY REPORT*  Clinical Data: Abdominal pain  ACUTE ABDOMEN SERIES (ABDOMEN 2 VIEW & CHEST 1 VIEW)  Comparison: CT 07/14/2011  Findings: Heart size is normal.  Mildly prominent interstitial markings are most compatible with scarring as previously seen versus atelectasis.  No free air beneath the diaphragms.  The cholecystectomy  Moderate to large stool volume noted throughout nondilated colon. Pelvic phleboliths noted.  No dilated small or large loop of bowel. No  differential air-fluid level.  No acute osseous finding.  IMPRESSION: Moderate large stool volume.  No acute abnormality.  Original Report Authenticated By: Harrel Lemon, M.D.    Brief H and P: For complete details please refer to admission H and P, but in brief This is a 41 year old female with chronic pancreatitis, who has had recurrent admissions for abdominal pain. She reports to the emergency room today with complaints of epigastric pain, nausea, vomiting. She reports these symptoms have been occurring for approximately 2 weeks now. She was initially seen in the emergency department approximately 2 weeks ago for similar complaints. She was sent home at that time with pain medication as well as plan to start a clear liquid diet. She reports that her pain has been persisting, and since then it has gradually gotten worse. She denies any fevers, but felt that she has had chills. She reports  that she has not had a bowel movement in approximately a week. She is passing flatus. She reports the pain is epigastric in nature but her whole abdomen feels sore. She's been vomiting after she tries to eat anything. Her pain also gets worse after trying to eat. Patient has chronic pancreatitis and is followed at [facility]. She receives nerve blocks for her chronic pancreatitis. She is also s/p cholecystectomy in the past. She denies any recent alcohol consumption. She was evaluated in the emergency room here. It was felt the patient would not be able to tolerate outpatient therapy since she is unable to keep any medicine down. She's been referred for admission.   Hospital Course:  Patient was admitted to the hospital with complaints of abdominal pain. Acute abdominal series was done which showed moderate constipation. Patient received an enema as well as MiraLAX. She did move her bowels and noticed improvement of her pain. She was initially kept on a clear liquid diet and this has  been advanced to full liquids. She is tolerating this without any vomiting. She is complaining of some chronic back pain at this point. Overall she feels improved and feels as though she can manage her pain at home. She's been advised to use daily MiraLAX as being on chronic opioids will lead to chronic constipation. She was also advised to take a suppository/enema if she does not have a bowel movement in 3 days. Plan discussed with the patient as well as her husband who were in agreement. Patient will be discharged home today to follow up with her primary care physician for further management.  Time spent on Discharge:  Signed: Angelica Frandsen [facility] Pager: [phone] 08/30/2011, 12:00 PM

## 2011-08-30 NOTE — Progress Notes (Signed)
IV removed, site WNL.  Pt given d/c instructions and new prescriptions.  Discussed home care with patient and discussed home medications, patient verbalizes understanding. F/U appointment to be made by pt (MD office closed), pt states they will make and keep appointments. Pt is stable at this time. Pt taken to main entrance in wheelchair by staff member.

## 2011-09-23 ENCOUNTER — Encounter (HOSPITAL_COMMUNITY): Payer: Self-pay | Admitting: Emergency Medicine

## 2011-09-23 ENCOUNTER — Emergency Department (HOSPITAL_COMMUNITY): Payer: Self-pay

## 2011-09-23 ENCOUNTER — Inpatient Hospital Stay (HOSPITAL_COMMUNITY)
Admission: EM | Admit: 2011-09-23 | Discharge: 2011-09-23 | DRG: 202 | Disposition: A | Discharge status: Home / Self Care | Payer: Self-pay | Attending: Internal Medicine | Admitting: Internal Medicine

## 2011-09-23 DIAGNOSIS — I10 Essential (primary) hypertension: Secondary | ICD-10-CM

## 2011-09-23 DIAGNOSIS — K921 Melena: Secondary | ICD-10-CM

## 2011-09-23 DIAGNOSIS — G43909 Migraine, unspecified, not intractable, without status migrainosus: Secondary | ICD-10-CM | POA: Diagnosis present

## 2011-09-23 DIAGNOSIS — K219 Gastro-esophageal reflux disease without esophagitis: Secondary | ICD-10-CM

## 2011-09-23 DIAGNOSIS — E872 Acidosis, unspecified: Secondary | ICD-10-CM | POA: Diagnosis present

## 2011-09-23 DIAGNOSIS — F411 Generalized anxiety disorder: Secondary | ICD-10-CM | POA: Diagnosis present

## 2011-09-23 DIAGNOSIS — F17200 Nicotine dependence, unspecified, uncomplicated: Secondary | ICD-10-CM | POA: Diagnosis present

## 2011-09-23 DIAGNOSIS — H6090 Unspecified otitis externa, unspecified ear: Secondary | ICD-10-CM

## 2011-09-23 DIAGNOSIS — E876 Hypokalemia: Secondary | ICD-10-CM

## 2011-09-23 DIAGNOSIS — F329 Major depressive disorder, single episode, unspecified: Secondary | ICD-10-CM

## 2011-09-23 DIAGNOSIS — M62838 Other muscle spasm: Secondary | ICD-10-CM

## 2011-09-23 DIAGNOSIS — K222 Esophageal obstruction: Secondary | ICD-10-CM

## 2011-09-23 DIAGNOSIS — R109 Unspecified abdominal pain: Secondary | ICD-10-CM

## 2011-09-23 DIAGNOSIS — R0603 Acute respiratory distress: Secondary | ICD-10-CM

## 2011-09-23 DIAGNOSIS — D509 Iron deficiency anemia, unspecified: Secondary | ICD-10-CM

## 2011-09-23 DIAGNOSIS — Z720 Tobacco use: Secondary | ICD-10-CM

## 2011-09-23 DIAGNOSIS — K861 Other chronic pancreatitis: Secondary | ICD-10-CM

## 2011-09-23 DIAGNOSIS — K449 Diaphragmatic hernia without obstruction or gangrene: Secondary | ICD-10-CM

## 2011-09-23 DIAGNOSIS — R112 Nausea with vomiting, unspecified: Secondary | ICD-10-CM

## 2011-09-23 DIAGNOSIS — J45909 Unspecified asthma, uncomplicated: Secondary | ICD-10-CM | POA: Diagnosis present

## 2011-09-23 DIAGNOSIS — J45901 Unspecified asthma with (acute) exacerbation: Principal | ICD-10-CM | POA: Diagnosis present

## 2011-09-23 DIAGNOSIS — R197 Diarrhea, unspecified: Secondary | ICD-10-CM

## 2011-09-23 DIAGNOSIS — F341 Dysthymic disorder: Secondary | ICD-10-CM | POA: Diagnosis present

## 2011-09-23 DIAGNOSIS — J9601 Acute respiratory failure with hypoxia: Secondary | ICD-10-CM

## 2011-09-23 DIAGNOSIS — K859 Acute pancreatitis without necrosis or infection, unspecified: Secondary | ICD-10-CM

## 2011-09-23 LAB — BLOOD GAS, ARTERIAL
FIO2: 100 %
O2 Saturation: 97.8 %
TCO2: 22.6 mmol/L (ref 0–100)
pO2, Arterial: 125 mmHg — ABNORMAL HIGH (ref 80.0–100.0)

## 2011-09-23 LAB — BASIC METABOLIC PANEL
BUN: 13 mg/dL (ref 6–23)
BUN: 11 mg/dL (ref 6–23)
CO2: 25 mEq/L (ref 19–32)
CO2: 25 mEq/L (ref 19–32)
Calcium: 9.5 mg/dL (ref 8.4–10.5)
Calcium: 9.2 mg/dL (ref 8.4–10.5)
Chloride: 102 mEq/L (ref 96–112)
Chloride: 101 mEq/L (ref 96–112)
Creatinine, Ser: 0.58 mg/dL (ref 0.50–1.10)
GFR calc Af Amer: >90 mL/min (ref >90)
GFR calc non Af Amer: >90 mL/min (ref >90)
Glucose, Bld: 167 mg/dL — ABNORMAL HIGH (ref 70–99)
Glucose, Bld: 235 mg/dL — ABNORMAL HIGH (ref 70–99)
Potassium: 2.8 mEq/L — ABNORMAL LOW (ref 3.5–5.1)
Sodium: 136 mEq/L (ref 135–145)

## 2011-09-23 LAB — DIFFERENTIAL
Basophils Absolute: 0.1 10*3/uL (ref 0.0–0.1)
Eosinophils Relative: 9 % — ABNORMAL HIGH (ref 0–5)
Lymphocytes Relative: 24 % (ref 12–46)
Monocytes Relative: 7 % (ref 3–12)
Neutro Abs: 8.4 10*3/uL — ABNORMAL HIGH (ref 1.7–7.7)
Neutrophils Relative %: 60 % (ref 43–77)

## 2011-09-23 LAB — CBC
HCT: 36.1 % (ref 36.0–46.0)
Hemoglobin: 12.4 g/dL (ref 12.0–15.0)
MCH: 30.8 pg (ref 26.0–34.0)
MCHC: 34.3 g/dL (ref 30.0–36.0)
MCV: 89.6 fL (ref 78.0–100.0)
Platelets: 203 10*3/uL (ref 150–400)
Platelets: 251 10*3/uL (ref 150–400)
RDW: 11.7 % (ref 11.5–15.5)
RDW: 11.8 % (ref 11.5–15.5)
WBC: 14 10*3/uL — ABNORMAL HIGH (ref 4.0–10.5)

## 2011-09-23 LAB — MRSA PCR SCREENING: MRSA by PCR: NEGATIVE

## 2011-09-23 MED ORDER — ALBUTEROL (5 MG/ML) CONTINUOUS INHALATION SOLN
INHALATION_SOLUTION | RESPIRATORY_TRACT | Status: AC
  Filled 2011-09-23: qty 20

## 2011-09-23 MED ORDER — ACETAMINOPHEN 650 MG RE SUPP: 650.0000 mg | Freq: Four times a day (QID) | RECTAL | Status: DC | PRN

## 2011-09-23 MED ORDER — ALBUTEROL SULFATE (5 MG/ML) 0.5% IN NEBU
2.5000 mg | INHALATION_SOLUTION | Freq: Four times a day (QID) | RESPIRATORY_TRACT | Status: DC
  Administered 2011-09-23: 2.5 mg via RESPIRATORY_TRACT
  Filled 2011-09-23: qty 0.5

## 2011-09-23 MED ORDER — SODIUM CHLORIDE 0.9 % IV SOLN: 250.0000 mL | INTRAVENOUS | Status: DC | PRN

## 2011-09-23 MED ORDER — POTASSIUM CHLORIDE CRYS ER 20 MEQ PO TBCR
40.0000 meq | EXTENDED_RELEASE_TABLET | Freq: Once | ORAL | Status: AC
  Administered 2011-09-23: 40 meq via ORAL
  Filled 2011-09-23: qty 2

## 2011-09-23 MED ORDER — MAGNESIUM SULFATE 40 MG/ML IJ SOLN
2.0000 g | INTRAMUSCULAR | Status: AC
  Administered 2011-09-23: 2 g via INTRAVENOUS
  Filled 2011-09-23: qty 50

## 2011-09-23 MED ORDER — ACETAMINOPHEN 325 MG PO TABS: 650.0000 mg | ORAL_TABLET | Freq: Four times a day (QID) | ORAL | Status: DC | PRN

## 2011-09-23 MED ORDER — ALBUTEROL SULFATE (5 MG/ML) 0.5% IN NEBU: 2.5000 mg | INHALATION_SOLUTION | RESPIRATORY_TRACT | Status: DC | PRN

## 2011-09-23 MED ORDER — CARBAMAZEPINE 200 MG PO TABS
200.0000 mg | ORAL_TABLET | Freq: Two times a day (BID) | ORAL | Status: DC
  Filled 2011-09-23: qty 1

## 2011-09-23 MED ORDER — FLUTICASONE PROPIONATE HFA 44 MCG/ACT IN AERO: 1.0000 | INHALATION_SPRAY | Freq: Two times a day (BID) | RESPIRATORY_TRACT | Status: DC

## 2011-09-23 MED ORDER — ALBUTEROL (5 MG/ML) CONTINUOUS INHALATION SOLN: 10.0000 mg | INHALATION_SOLUTION | RESPIRATORY_TRACT | Status: DC

## 2011-09-23 MED ORDER — ALBUTEROL SULFATE (5 MG/ML) 0.5% IN NEBU
2.5000 mg | INHALATION_SOLUTION | RESPIRATORY_TRACT | Status: DC
  Administered 2011-09-23: 2.5 mg via RESPIRATORY_TRACT

## 2011-09-23 MED ORDER — PREDNISONE 20 MG PO TABS
40.0000 mg | ORAL_TABLET | Freq: Once | ORAL | Status: AC
  Administered 2011-09-23: 40 mg via ORAL
  Filled 2011-09-23: qty 2

## 2011-09-23 MED ORDER — ALBUTEROL SULFATE (5 MG/ML) 0.5% IN NEBU
2.5000 mg | INHALATION_SOLUTION | Freq: Once | RESPIRATORY_TRACT | Status: DC
  Filled 2011-09-23: qty 0.5

## 2011-09-23 MED ORDER — GUAIFENESIN-DM 100-10 MG/5ML PO SYRP: 5.0000 mL | ORAL_SOLUTION | ORAL | Status: DC | PRN

## 2011-09-23 MED ORDER — AMLODIPINE BESYLATE 5 MG PO TABS
10.0000 mg | ORAL_TABLET | Freq: Every day | ORAL | Status: DC
  Filled 2011-09-23: qty 2

## 2011-09-23 MED ORDER — CITALOPRAM HYDROBROMIDE 20 MG PO TABS
20.0000 mg | ORAL_TABLET | Freq: Every day | ORAL | Status: DC
  Filled 2011-09-23: qty 1

## 2011-09-23 MED ORDER — HYDROCODONE-ACETAMINOPHEN 10-325 MG PO TABS
1.0000 | ORAL_TABLET | Freq: Four times a day (QID) | ORAL | Status: DC | PRN
  Administered 2011-09-23: 1 via ORAL
  Filled 2011-09-23: qty 1

## 2011-09-23 MED ORDER — POTASSIUM CHLORIDE CRYS ER 20 MEQ PO TBCR
60.0000 meq | EXTENDED_RELEASE_TABLET | Freq: Once | ORAL | Status: AC
  Administered 2011-09-23: 60 meq via ORAL
  Filled 2011-09-23: qty 3

## 2011-09-23 MED ORDER — AZITHROMYCIN 250 MG PO TABS
500.0000 mg | ORAL_TABLET | Freq: Every day | ORAL | Status: AC
  Administered 2011-09-23: 500 mg via ORAL
  Filled 2011-09-23: qty 2

## 2011-09-23 MED ORDER — DOCUSATE SODIUM 100 MG PO CAPS: 100.0000 mg | ORAL_CAPSULE | Freq: Every day | ORAL | Status: DC

## 2011-09-23 MED ORDER — IPRATROPIUM BROMIDE 0.02 % IN SOLN
0.5000 mg | Freq: Once | RESPIRATORY_TRACT | Status: AC
  Administered 2011-09-23: 0.5 mg via RESPIRATORY_TRACT
  Filled 2011-09-23: qty 2.5

## 2011-09-23 MED ORDER — CLONAZEPAM 0.5 MG PO TABS: 0.5000 mg | ORAL_TABLET | Freq: Three times a day (TID) | ORAL | Status: DC | PRN

## 2011-09-23 MED ORDER — IPRATROPIUM BROMIDE 0.02 % IN SOLN
0.5000 mg | Freq: Four times a day (QID) | RESPIRATORY_TRACT | Status: DC
  Administered 2011-09-23: 0.5 mg via RESPIRATORY_TRACT

## 2011-09-23 MED ORDER — METHYLPREDNISOLONE SODIUM SUCC 125 MG IJ SOLR
80.0000 mg | Freq: Two times a day (BID) | INTRAMUSCULAR | Status: DC
  Administered 2011-09-23: 80 mg via INTRAVENOUS
  Filled 2011-09-23: qty 2

## 2011-09-23 MED ORDER — ONDANSETRON HCL 4 MG PO TABS: 4.0000 mg | ORAL_TABLET | Freq: Four times a day (QID) | ORAL | Status: DC | PRN

## 2011-09-23 MED ORDER — SODIUM CHLORIDE 0.9 % IJ SOLN: 3.0000 mL | INTRAMUSCULAR | Status: DC | PRN

## 2011-09-23 MED ORDER — SERTRALINE HCL 50 MG PO TABS
50.0000 mg | ORAL_TABLET | Freq: Every day | ORAL | Status: DC
  Filled 2011-09-23: qty 1

## 2011-09-23 MED ORDER — AZITHROMYCIN 250 MG PO TABS: 250.0000 mg | ORAL_TABLET | Freq: Every day | ORAL | Status: DC

## 2011-09-23 MED ORDER — DEXTROSE 5 % IV SOLN
500.0000 mg | INTRAVENOUS | Status: DC
  Administered 2011-09-23: 500 mg via INTRAVENOUS
  Filled 2011-09-23: qty 500

## 2011-09-23 MED ORDER — SODIUM CHLORIDE 0.9 % IJ SOLN
3.0000 mL | Freq: Two times a day (BID) | INTRAMUSCULAR | Status: DC
  Administered 2011-09-23: 3 mL via INTRAVENOUS
  Filled 2011-09-23: qty 3

## 2011-09-23 MED ORDER — KETOROLAC TROMETHAMINE 30 MG/ML IJ SOLN
30.0000 mg | Freq: Once | INTRAMUSCULAR | Status: AC
  Administered 2011-09-23: 30 mg via INTRAVENOUS
  Filled 2011-09-23: qty 1

## 2011-09-23 MED ORDER — ONDANSETRON HCL 4 MG/2ML IJ SOLN
4.0000 mg | Freq: Four times a day (QID) | INTRAMUSCULAR | Status: DC | PRN
  Administered 2011-09-23: 4 mg via INTRAVENOUS
  Filled 2011-09-23: qty 2

## 2011-09-23 MED ORDER — SODIUM CHLORIDE 0.9 % IV SOLN
INTRAVENOUS | Status: DC
  Administered 2011-09-23: 04:00:00 via INTRAVENOUS

## 2011-09-23 NOTE — ED Notes (Signed)
Patient states she woke up this morning short of breath and tired. Gave herself 8 breathing treatments today with no relief. Also complaining of pain in left side. Patient has audible wheezing, breathing labored at triage.

## 2011-09-23 NOTE — H&P (Signed)
PCP:   Cassell Smiles., MD, MD   Chief Complaint:  Shortness of breath  HPI: 41 year old female with a history of asthma since her early 54s he quit smoking 3 weeks ago who comes in with several days of worsening shortness of breath and wheezing. She's been having some subjective fever with nausea without any vomiting, diarrhea, chest pain, abdominal pain. She denies any dysuria. She has been having some body aches and myalgias. She's had marked improvement in the emergency department with an hour-long nebulizer treatment with albuterol and her respiratory status is now much improved. She says she is allergic to prednisone but her allergy is that it possibly could be causing her acute pancreatitis. She says that she's been able takes Solu-Medrol the past.  Review of Systems:  Otherwise negative  Past Medical History: Past Medical History  Diagnosis Date  . Asthma   . Depression   . Migraine   . Pancreatitis chronic    Past Surgical History  Procedure Date  . Cholecystectomy   . Tonsillectomy   . Abdominal hysterectomy     Medications: Prior to Admission medications   Medication Sig Start Date End Date Taking? Authorizing Provider  albuterol (PROVENTIL HFA;VENTOLIN HFA) 108 (90 BASE) MCG/ACT inhaler Inhale 2 puffs into the lungs daily as needed. For shortness of breath    Historical Provider, MD  albuterol (PROVENTIL) (2.5 MG/3ML) 0.083% nebulizer solution Take 2.5 mg by nebulization every 6 (six) hours as needed. For shortness of breath     Historical Provider, MD  amLODipine (NORVASC) 10 MG tablet Take 10 mg by mouth daily.      Historical Provider, MD  azithromycin (ZITHROMAX) 250 MG tablet Take 250 mg by mouth daily. Z-Pak prescribed on 08/27/11, patient has 2 doses remaining    Historical Provider, MD  carbamazepine (EPITOL) 200 MG tablet Take 200 mg by mouth 2 (two) times daily.    Historical Provider, MD  citalopram (CELEXA) 20 MG tablet Take 20 mg by mouth daily.     Historical Provider, MD  clonazePAM (KLONOPIN) 0.5 MG tablet Take 0.5 mg by mouth 3 (three) times daily as needed.    Historical Provider, MD  docusate sodium (COLACE) 100 MG capsule Take 100 mg by mouth at bedtime.      Historical Provider, MD  HYDROcodone-acetaminophen (NORCO) 10-325 MG per tablet Take 1 tablet by mouth every 6 (six) hours as needed for pain. pain 08/30/11   Erick Blinks, MD  omeprazole (PRILOSEC) 20 MG capsule Take 20 mg by mouth 3 (three) times daily.      Historical Provider, MD  sertraline (ZOLOFT) 100 MG tablet Take 50 mg by mouth daily.    Historical Provider, MD    Allergies:   Allergies  Allergen Reactions  . Penicillins     REACTION: Unknown reaction  . Prednisone     pancreatitis  . Sulfa Antibiotics     Social History:  reports that she has been smoking Cigarettes.  She has a 15 pack-year smoking history. She uses smokeless tobacco. She reports that she does not drink alcohol or use illicit drugs.  Family History: Family History  Problem Relation Age of Onset  . Asthma Other   . Coronary artery disease Neg Hx     Physical Exam: Filed Vitals:   09/23/11 0054 09/23/11 0115 09/23/11 0145 09/23/11 0201  BP:  108/71 112/59 112/59  Pulse:  110 110 115  Temp:      Resp:  21 22 16   Height:  Weight:      SpO2: 97% 98% 99% 100%   BP 115/65  Pulse 110  Temp(Src) 97.6 F (36.4 C) (Oral)  Resp 15  Ht 5\' 2"  (1.575 m)  Wt 74.1 kg (163 lb 5.8 oz)  BMI 29.88 kg/m2  SpO2 99% General appearance: alert, cooperative and no distress Lungs: wheezes bibasilar Heart: regular rate and rhythm, S1, S2 normal, no murmur, click, rub or gallop Abdomen: soft, non-tender; bowel sounds normal; no masses,  no organomegaly Extremities: extremities normal, atraumatic, no cyanosis or edema Pulses: 2+ and symmetric Skin: Skin color, texture, turgor normal. No rashes or lesions Neurologic: Grossly normal    Labs on Admission:   [facility] 09/23/11 0039  NA 136    K 3.3*  CL 102  CO2 25  GLUCOSE 167*  BUN 13  CREATININE 0.58  CALCIUM 9.5  MG --  PHOS --    Basename 09/23/11 0039  WBC 14.0*  NEUTROABS 8.4*  HGB 13.5  HCT 38.9  MCV 89.6  PLT 251    Radiological Exams on Admission: Dg Chest Port 1 View  09/23/2011  *RADIOLOGY REPORT*  Clinical Data: Difficultly breathing and history of asthma.  PORTABLE CHEST - 1 VIEW  Comparison: 06/18/2010  Findings: Single view of the chest was obtained.  Patient is mildly rotated on this examination. There is a small retrocardiac density most likely representing a hiatal hernia.  Mild haziness in the medial upper lungs but no focal disease.  The heart size is within normal limits.  IMPRESSION: No acute chest findings.  Evidence for a hiatal hernia.  Original Report Authenticated By: Richarda Overlie, M.D.   Acute Abdominal Series  08/29/2011  *RADIOLOGY REPORT*  Clinical Data: Abdominal pain  ACUTE ABDOMEN SERIES (ABDOMEN 2 VIEW & CHEST 1 VIEW)  Comparison: CT 07/14/2011  Findings: Heart size is normal.  Mildly prominent interstitial markings are most compatible with scarring as previously seen versus atelectasis.  No free air beneath the diaphragms.  The cholecystectomy  Moderate to large stool volume noted throughout nondilated colon. Pelvic phleboliths noted.  No dilated small or large loop of bowel. No differential air-fluid level.  No acute osseous finding.  IMPRESSION: Moderate large stool volume.  No acute abnormality.  Original Report Authenticated By: Harrel Lemon, M.D.    Assessment/Plan Present on Admission:  41 year old female with acute hypoxic respiratory failure secondary to asthma exacerbation  .ANXIETY .Acute respiratory failure with hypoxia .ASTHMA .Tobacco abuse  We'll place on Z-Pak and Solu-Medrol. Probably has a viral infection. Provide some Tylenol as needed and frequent bronchodilators. She started having marked improvement in the emergency department she should feel much better and  24-48 hours.  Nylen Creque [phone] 09/23/2011, 2:55 AM

## 2011-09-23 NOTE — ED Notes (Signed)
Attempted to call report, was told the nurse is getting report on another patient and would call me back in about 15 minutes.

## 2011-09-23 NOTE — Progress Notes (Signed)
Provided discharge medications and instructions and reviewed with the patient.  Pt verbalized understanding and does not have any questions regarding instructions.  Pt is A&O, VSS.  Pt has indicated that she is going to drive home because her car is parked in the ED parking area.  Pt states that she feels fine about driving home.  Took pt downstairs via wheelchair to ED.

## 2011-09-23 NOTE — ED Provider Notes (Signed)
History     CSN: [id number]  Arrival date & time 09/23/11  0019   First MD Initiated Contact with Patient 09/23/11 0029      Chief Complaint  Patient presents with  . Shortness of Breath  . Wheezing    (Consider location/radiation/quality/duration/timing/severity/associated sxs/prior treatment) HPI Comments: 41 year old female with a history of asthma, pancreatitis and migraine presents with a complaint of shortness of breath and wheezing. This was gradual onset this morning, persistent, gradually getting worse and associated with several days of coughing. She states that she has taken approximately 8 treatments of albuterol prior to arrival with minimal improvement. She denies fevers chills nausea vomiting swelling of the legs back pain or chest pain. She does have significant difficulty speaking and speaks in only 2-3 words sentences.  Patient is a 41 y.o. female presenting with shortness of breath and wheezing. The history is provided by the patient and medical records. The history is limited by the condition of the patient (respiratory distress).  Shortness of Breath  Associated symptoms include shortness of breath and wheezing.  Wheezing  Associated symptoms include shortness of breath and wheezing.    Past Medical History  Diagnosis Date  . Asthma   . Depression   . Migraine   . Pancreatitis chronic     Past Surgical History  Procedure Date  . Cholecystectomy   . Tonsillectomy   . Abdominal hysterectomy     Family History  Problem Relation Age of Onset  . Asthma Other   . Coronary artery disease Neg Hx     History  Substance Use Topics  . Smoking status: Current Everyday Smoker -- 1.0 packs/day for 15 years    Types: Cigarettes  . Smokeless tobacco: Current User  . Alcohol Use: No    OB History    Grav Para Term Preterm Abortions TAB SAB Ect Mult Living                  Review of Systems  Unable to perform ROS Respiratory: Positive for shortness of  breath and wheezing.     Allergies  Penicillins; Prednisone; and Sulfa antibiotics  Home Medications   Current Outpatient Rx  Name Route Sig Dispense Refill  . ALBUTEROL SULFATE HFA 108 (90 BASE) MCG/ACT IN AERS Inhalation Inhale 2 puffs into the lungs daily as needed. For shortness of breath    . ALBUTEROL SULFATE (2.5 MG/3ML) 0.083% IN NEBU Nebulization Take 2.5 mg by nebulization every 6 (six) hours as needed. For shortness of breath     . AMLODIPINE BESYLATE 10 MG PO TABS Oral Take 10 mg by mouth daily.      . AZITHROMYCIN 250 MG PO TABS Oral Take 250 mg by mouth daily. Z-Pak prescribed on 08/27/11, patient has 2 doses remaining    . CARBAMAZEPINE 200 MG PO TABS Oral Take 200 mg by mouth 2 (two) times daily.    Marland Kitchen CITALOPRAM HYDROBROMIDE 20 MG PO TABS Oral Take 20 mg by mouth daily.    Marland Kitchen CLONAZEPAM 0.5 MG PO TABS Oral Take 0.5 mg by mouth 3 (three) times daily as needed.    Marland Kitchen DOCUSATE SODIUM 100 MG PO CAPS Oral Take 100 mg by mouth at bedtime.      Marland Kitchen HYDROCODONE-ACETAMINOPHEN 10-325 MG PO TABS Oral Take 1 tablet by mouth every 6 (six) hours as needed for pain. pain 30 tablet 0  . OMEPRAZOLE 20 MG PO CPDR Oral Take 20 mg by mouth 3 (three) times daily.      Marland Kitchen  SERTRALINE HCL 100 MG PO TABS Oral Take 50 mg by mouth daily.      BP 112/59  Pulse 115  Temp 97.7 F (36.5 C)  Resp 16  Ht 5\' 2"  (1.575 m)  Wt 161 lb (73.029 kg)  BMI 29.45 kg/m2  SpO2 100%  Physical Exam  Nursing note and vitals reviewed. Constitutional: She appears well-developed and well-nourished. No distress.  HENT:  Head: Normocephalic and atraumatic.  Mouth/Throat: Oropharynx is clear and moist. No oropharyngeal exudate.  Eyes: Conjunctivae and EOM are normal. Pupils are equal, round, and reactive to light. Right eye exhibits no discharge. Left eye exhibits no discharge. No scleral icterus.  Neck: Normal range of motion. Neck supple. No JVD present. No thyromegaly present.  Cardiovascular: Regular rhythm,  normal heart sounds and intact distal pulses.  Exam reveals no gallop and no friction rub.   No murmur heard.      Tachycardia, 125  Pulmonary/Chest: She is in respiratory distress. She has wheezes. She has no rales.       Severe respiratory distress with prolonged expiratory phase, diffuse expiratory wheezing and diminished lung sounds. There is accessory muscle use.  Abdominal: Soft. Bowel sounds are normal. She exhibits no distension and no mass. There is no tenderness.  Musculoskeletal: Normal range of motion. She exhibits no edema and no tenderness.  Lymphadenopathy:    She has no cervical adenopathy.  Neurological: She is alert. Coordination normal.  Skin: Skin is warm and dry. No rash noted. No erythema.  Psychiatric: She has a normal mood and affect. Her behavior is normal.    ED Course  Procedures (including critical care time)  Labs Reviewed  CBC - Abnormal; Notable for the following:    WBC 14.0 (*)    All other components within normal limits  DIFFERENTIAL - Abnormal; Notable for the following:    Neutro Abs 8.4 (*)    Eosinophils Relative 9 (*)    Eosinophils Absolute 1.3 (*)    All other components within normal limits  BASIC METABOLIC PANEL - Abnormal; Notable for the following:    Potassium 3.3 (*)    Glucose, Bld 167 (*)    All other components within normal limits  BLOOD GAS, ARTERIAL - Abnormal; Notable for the following:    pH, Arterial 7.254 (*)    pCO2 arterial 57.7 (*)    pO2, Arterial 125.0 (*)    Bicarbonate 24.6 (*)    All other components within normal limits   Dg Chest Port 1 View  09/23/2011  *RADIOLOGY REPORT*  Clinical Data: Difficultly breathing and history of asthma.  PORTABLE CHEST - 1 VIEW  Comparison: 06/18/2010  Findings: Single view of the chest was obtained.  Patient is mildly rotated on this examination. There is a small retrocardiac density most likely representing a hiatal hernia.  Mild haziness in the medial upper lungs but no focal  disease.  The heart size is within normal limits.  IMPRESSION: No acute chest findings.  Evidence for a hiatal hernia.  Original Report Authenticated By: Richarda Overlie, M.D.     1. Respiratory distress   2. Respiratory acidosis   3. Asthma attack       MDM  Tachycardic in significant respiratory distress likely related to asthma exacerbation. Will rule out community acquired pneumonia or pneumothorax as other source, oxygen saturations at this time are 89% on room air. Supplemental oxygen, continuous nebulizer, prednisone. I realize that she is prednisone listed as an allergy however at this time  I think the benefits outweigh the risks.  ED Course:  Patient had continuous nebulizer therapy and this has improved her oxygenation to the low 90% range. Her pulse has remained in the 120s, there is no  Fever, she is able to speak in truncated sentences at this time. On repeat evaluation she continues to have diffuse expiratory wheezing and inspiratory wheezing, increased airflow but persistent tachypnea and increased work of breathing. Her oxygen has improved significantly. After some discussion of the case with the [facility] hospitalist Dr. Onalee Hua, the patient will be admitted to the step down unit with ongoing nebulizer therapy. Critical care delivered for ongoing respiratory distress with continuous nebulizer therapy for respiratory acidosis  CRITICAL CARE Performed by: Vida Roller   Total critical care time: 35  Critical care time was exclusive of separately billable procedures and treating other patients.  Critical care was necessary to treat or prevent imminent or life-threatening deterioration.  Critical care was time spent personally by me on the following activities: development of treatment plan with patient and/or surrogate as well as nursing, discussions with consultants, evaluation of patient's response to treatment, examination of patient, obtaining history from patient or surrogate,  ordering and performing treatments and interventions, ordering and review of laboratory studies, ordering and review of radiographic studies, pulse oximetry and re-evaluation of patient's condition.    Labarotory results reviewed:  White blood cell count of 14.0, potassium of 3.3, ABG showing respiratory acidosis with pH of 7.254 and a PCO2 of 57.7.    Radiology imaging reviewed:  I have personally reviewed the x-ray and find no acute findings, the radiology read is in agreement   Previous Records reviewed: cord to the medical record the patient has been seen multiple times in the past for pancreatitis, recent ER visits or admissions for asthma or respiratory related disease processes   Medications given in ED: Zithromax, Toradol, prednisone, albuterol, magnesium, Atrovent  The pt and (or) family members were informed of results and treatment plan  Consultation: [facility] hospitalist  Disposition:  admit            Vida Roller, MD 09/23/11 0225

## 2011-09-23 NOTE — Plan of Care (Signed)
Problem: Consults Goal: Respiratory Problems Patient Education See Patient Education Module for education specifics. Outcome: Progressing Pt admitted via ED for SOB with wheezing & reported sats 85-90% & placed on NRB. At time of ICU admit pt is on [place] 2 liters & sat 98% with lungs clear but diminished

## 2011-09-23 NOTE — Progress Notes (Signed)
Inpatient Diabetes Program Recommendations  AACE/ADA: New Consensus Statement on Inpatient Glycemic Control (2009)  Target Ranges:  Prepandial:   less than 140 mg/dL      Peak postprandial:   less than 180 mg/dL (1-2 hours)      Critically ill patients:  140 - 180 mg/dL   Reason for Visit: Elevated glucose: 235 mg/dL  Inpatient Diabetes Program Recommendations Correction (SSI): Add Novolog Correction while on IV steroids

## 2011-09-23 NOTE — Discharge Summary (Signed)
Physician Discharge Summary  Patient ID: [name] MRN: [id number] DOB/AGE: 04/02/1971 41 y.o. Primary Care Physician:FUSCO,LAWRENCE J., MD, MD Admit date: 09/23/2011 Discharge date: 09/23/2011    Discharge Diagnoses:  1. Acute asthmatic attack, resolved. 2. Depression and anxiety. 3. Recent history of tobacco abuse.   Medication List  As of 09/23/2011  8:27 AM   STOP taking these medications         sertraline 100 MG tablet         TAKE these medications         albuterol 108 (90 BASE) MCG/ACT inhaler   Commonly known as: PROVENTIL HFA;VENTOLIN HFA   Inhale 2 puffs into the lungs daily as needed. For shortness of breath      albuterol (2.5 MG/3ML) 0.083% nebulizer solution   Commonly known as: PROVENTIL   Take 2.5 mg by nebulization every 6 (six) hours as needed. For shortness of breath      amLODipine 10 MG tablet   Commonly known as: NORVASC   Take 10 mg by mouth daily.      azithromycin 250 MG tablet   Commonly known as: ZITHROMAX   Take 250 mg by mouth daily. Z-Pak prescribed on 08/27/11, patient has 2 doses remaining      citalopram 20 MG tablet   Commonly known as: CELEXA   Take 20 mg by mouth daily.      clonazePAM 0.5 MG tablet   Commonly known as: KLONOPIN   Take 0.5 mg by mouth 3 (three) times daily as needed.      docusate sodium 100 MG capsule   Commonly known as: COLACE   Take 100 mg by mouth at bedtime.      EPITOL 200 MG tablet   Generic drug: carbamazepine   Take 200 mg by mouth 2 (two) times daily.      fluticasone 44 MCG/ACT inhaler   Commonly known as: FLOVENT HFA   Inhale 1 puff into the lungs 2 (two) times daily.      HYDROcodone-acetaminophen 10-325 MG per tablet   Commonly known as: NORCO   Take 1 tablet by mouth every 6 (six) hours as needed for pain. pain      omeprazole 20 MG capsule   Commonly known as: PRILOSEC   Take 20 mg by mouth 3 (three) times daily.            Discharged Condition: Improved and  stable.    Consults: None.  Significant Diagnostic Studies: Dg Chest Port 1 View  09/23/2011  *RADIOLOGY REPORT*  Clinical Data: Difficultly breathing and history of asthma.  PORTABLE CHEST - 1 VIEW  Comparison: 06/18/2010  Findings: Single view of the chest was obtained.  Patient is mildly rotated on this examination. There is a small retrocardiac density most likely representing a hiatal hernia.  Mild haziness in the medial upper lungs but no focal disease.  The heart size is within normal limits.  IMPRESSION: No acute chest findings.  Evidence for a hiatal hernia.  Original Report Authenticated By: Richarda Overlie, M.D.   Acute Abdominal Series  08/29/2011  *RADIOLOGY REPORT*  Clinical Data: Abdominal pain  ACUTE ABDOMEN SERIES (ABDOMEN 2 VIEW & CHEST 1 VIEW)  Comparison: CT 07/14/2011  Findings: Heart size is normal.  Mildly prominent interstitial markings are most compatible with scarring as previously seen versus atelectasis.  No free air beneath the diaphragms.  The cholecystectomy  Moderate to large stool volume noted throughout nondilated colon. Pelvic phleboliths noted.  No  dilated small or large loop of bowel. No differential air-fluid level.  No acute osseous finding.  IMPRESSION: Moderate large stool volume.  No acute abnormality.  Original Report Authenticated By: Harrel Lemon, M.D.    Lab Results: Basic Metabolic Panel:  Basename 09/23/11 0419 09/23/11 0039  NA 137 136  K 2.8* 3.3*  CL 101 102  CO2 25 25  GLUCOSE 235* 167*  BUN 11 13  CREATININE 0.58 0.58  CALCIUM 9.2 9.5  MG -- --  PHOS -- --       CBC:  Basename 09/23/11 0419 09/23/11 0039  WBC 13.2* 14.0*  NEUTROABS -- 8.4*  HGB 12.4 13.5  HCT 36.1 38.9  MCV 89.6 89.6  PLT 203 251    Recent Results (from the past 240 hour(s))  MRSA PCR SCREENING     Status: Normal   Collection Time   09/23/11  3:55 AM      Component Value Range Status Comment   MRSA by PCR NEGATIVE  NEGATIVE  Final      Hospital  Course: This 41 year old lady came in yesterday with an acute asthmatic attack. She has been given intravenous steroids and antibiotics overnight and she has done extremely well. Her breathing is back to normal. She does have slight headache and she feels somewhat weak. Her potassium is low we'll replace this this morning. She did take Flovent on a regular basis previously but has not been taking any steroid inhalers and I've encouraged her to do so. I  will give her prescription for Flovent now.  Discharge Exam: Blood pressure 108/59, pulse 109, temperature 97.6 F (36.4 C), temperature source Oral, resp. rate 14, height 5\' 2"  (1.575 m), weight 74.1 kg (163 lb 5.8 oz), SpO2 97.00%. She looks systemically well. Lung fields are entirely clear with no evidence of wheezing crackles or bronchial breathing. She is alert and orientated without any focal neural signs. Heart sounds are present and normal. Abdomen is soft and nontender.  Disposition: Home. She'll follow with her primary care physician.  Discharge Orders    Future Orders Please Complete By Expires   Diet - low sodium heart healthy      Increase activity slowly         Follow-up Information    Follow up with Cassell Smiles., MD .         SignedWilson Singer Pager [phone]  09/23/2011, 8:27 AM

## 2011-10-11 ENCOUNTER — Encounter (HOSPITAL_COMMUNITY): Payer: Self-pay | Admitting: *Deleted

## 2011-10-11 ENCOUNTER — Emergency Department (HOSPITAL_COMMUNITY)
Admission: EM | Admit: 2011-10-11 | Discharge: 2011-10-12 | Disposition: A | Discharge status: Home / Self Care | Payer: Self-pay | Attending: Emergency Medicine | Admitting: Emergency Medicine

## 2011-10-11 ENCOUNTER — Emergency Department (HOSPITAL_COMMUNITY): Payer: Self-pay

## 2011-10-11 DIAGNOSIS — J4489 Other specified chronic obstructive pulmonary disease: Secondary | ICD-10-CM | POA: Insufficient documentation

## 2011-10-11 DIAGNOSIS — R0602 Shortness of breath: Secondary | ICD-10-CM | POA: Insufficient documentation

## 2011-10-11 DIAGNOSIS — J449 Chronic obstructive pulmonary disease, unspecified: Secondary | ICD-10-CM

## 2011-10-11 DIAGNOSIS — H6690 Otitis media, unspecified, unspecified ear: Secondary | ICD-10-CM | POA: Insufficient documentation

## 2011-10-11 DIAGNOSIS — R062 Wheezing: Secondary | ICD-10-CM | POA: Insufficient documentation

## 2011-10-11 DIAGNOSIS — F17200 Nicotine dependence, unspecified, uncomplicated: Secondary | ICD-10-CM | POA: Insufficient documentation

## 2011-10-11 MED ORDER — ALBUTEROL SULFATE (5 MG/ML) 0.5% IN NEBU
5.0000 mg | INHALATION_SOLUTION | Freq: Once | RESPIRATORY_TRACT | Status: AC
  Administered 2011-10-11: 5 mg via RESPIRATORY_TRACT
  Filled 2011-10-11: qty 1

## 2011-10-11 MED ORDER — IPRATROPIUM BROMIDE 0.02 % IN SOLN
0.5000 mg | Freq: Once | RESPIRATORY_TRACT | Status: AC
  Administered 2011-10-11: 0.5 mg via RESPIRATORY_TRACT
  Filled 2011-10-11: qty 2.5

## 2011-10-11 MED ORDER — IPRATROPIUM BROMIDE 0.02 % IN SOLN
RESPIRATORY_TRACT | Status: AC
  Administered 2011-10-11: 0.5 mg
  Filled 2011-10-11: qty 2.5

## 2011-10-11 MED ORDER — SODIUM CHLORIDE 0.9 % IV SOLN
INTRAVENOUS | Status: DC
  Administered 2011-10-11: 22:00:00 via INTRAVENOUS

## 2011-10-11 MED ORDER — AZITHROMYCIN 250 MG PO TABS
500.0000 mg | ORAL_TABLET | Freq: Once | ORAL | Status: AC
  Administered 2011-10-11: 500 mg via ORAL
  Filled 2011-10-11: qty 2

## 2011-10-11 MED ORDER — ALBUTEROL SULFATE (5 MG/ML) 0.5% IN NEBU
INHALATION_SOLUTION | RESPIRATORY_TRACT | Status: AC
  Administered 2011-10-11: 5 mg
  Filled 2011-10-11: qty 1

## 2011-10-11 MED ORDER — METHYLPREDNISOLONE 4 MG PO KIT: PACK | ORAL | Status: AC

## 2011-10-11 MED ORDER — AZITHROMYCIN 250 MG PO TABS: ORAL_TABLET | ORAL | Status: AC

## 2011-10-11 MED ORDER — METHYLPREDNISOLONE SODIUM SUCC 125 MG IJ SOLR
125.0000 mg | Freq: Once | INTRAMUSCULAR | Status: AC
  Administered 2011-10-11: 125 mg via INTRAVENOUS
  Filled 2011-10-11: qty 2

## 2011-10-11 NOTE — ED Notes (Signed)
Pt sob all day. Pt wheezing.

## 2011-10-11 NOTE — ED Provider Notes (Signed)
History  This chart was scribed for Nelia Shi, MD by Bennett Scrape. This patient was seen in room APA01/AP[facility]01 and the patient's care was started at 9:40PM.  CSN: [id number]  Arrival date & time 10/11/11  2106   First MD Initiated Contact with Patient 10/11/11 2130      Chief Complaint  Patient presents with  . Shortness of Breath  . Nausea    The history is provided by the patient. No language interpreter was used.    [name] is a 41 y.o. female with a h/o COPD who presents to the Emergency Department complaining of 2 days of gradual onset, gradually worsening, transient wheezing with associated SOB. She denies any modifying factors. She reports using her nebulizer at home with no improvement in symptoms. She denies recent smoking and secondhand smoke at home as the cause of her symptoms. Pt states that her symptoms first started with a non-productive cough but having gradually progressed to wheezing and SOB today. She lists nausea and body aches as associated symptoms. Pt denies chest pain, abdominal, sore throat, HA, fever and emesis as associated symptoms. She also has a h/o asthma and pancreatitis. She denies alcohol use.  Past Medical History  Diagnosis Date  . Asthma   . Depression   . Migraine   . Pancreatitis chronic     Past Surgical History  Procedure Date  . Cholecystectomy   . Tonsillectomy   . Abdominal hysterectomy     Family History  Problem Relation Age of Onset  . Asthma Other   . Coronary artery disease Neg Hx     History  Substance Use Topics  . Smoking status: Current Everyday Smoker -- 1.0 packs/day for 15 years    Types: Cigarettes  . Smokeless tobacco: Current User  . Alcohol Use: No    Review of Systems  A complete 10 system review of systems was obtained and is otherwise negative except as noted in the HPI.   Allergies  Penicillins; Prednisone; and Sulfa antibiotics  Home Medications   Current Outpatient Rx  Name  Route Sig Dispense Refill  . ALBUTEROL SULFATE HFA 108 (90 BASE) MCG/ACT IN AERS Inhalation Inhale 2 puffs into the lungs daily as needed. For shortness of breath    . ALBUTEROL SULFATE (2.5 MG/3ML) 0.083% IN NEBU Nebulization Take 2.5 mg by nebulization every 6 (six) hours as needed. For shortness of breath     . AMLODIPINE BESYLATE 10 MG PO TABS Oral Take 10 mg by mouth daily.      Marland Kitchen CARBAMAZEPINE 200 MG PO TABS Oral Take 200 mg by mouth 2 (two) times daily.    Marland Kitchen CITALOPRAM HYDROBROMIDE 20 MG PO TABS Oral Take 20 mg by mouth daily.    Marland Kitchen CLONAZEPAM 0.5 MG PO TABS Oral Take 0.5 mg by mouth 3 (three) times daily as needed.    Marland Kitchen DOCUSATE SODIUM 100 MG PO CAPS Oral Take 100 mg by mouth at bedtime.     Marland Kitchen FLUTICASONE PROPIONATE  HFA 44 MCG/ACT IN AERO Inhalation Inhale 1 puff into the lungs 2 (two) times daily. 1 Inhaler 1  . HYDROCODONE-ACETAMINOPHEN 10-325 MG PO TABS Oral Take 1 tablet by mouth every 6 (six) hours as needed for pain. pain 30 tablet 0  . OMEPRAZOLE 20 MG PO CPDR Oral Take 20 mg by mouth 3 (three) times daily.      . AZITHROMYCIN 250 MG PO TABS  Take 1 pill daily until gone 4 each 0  .  METHYLPREDNISOLONE 4 MG PO KIT  follow package directions 21 tablet 0    Triage Vitals: BP 114/87  Pulse 104  Temp 97.9 F (36.6 C)  Resp 24  Ht 5\' 2"  (1.575 m)  Wt 163 lb (73.936 kg)  BMI 29.81 kg/m2  SpO2 93%  Physical Exam  Nursing note and vitals reviewed. Constitutional: She is oriented to person, place, and time. She appears well-developed and well-nourished. No distress.  HENT:  Head: Normocephalic and atraumatic.  Eyes: Conjunctivae are normal. Pupils are equal, round, and reactive to light.  Neck: Normal range of motion. Neck supple.  Cardiovascular: Normal rate and intact distal pulses.   Pulmonary/Chest: Effort normal. No respiratory distress. She has wheezes (bilateral wheezing upon expiration).       Audible wheezing upon bedside, no nasal flaring noted, no retractions noted,  speaking in full sentences   Abdominal: Normal appearance. She exhibits no distension. There is no tenderness.  Musculoskeletal: Normal range of motion.  Neurological: She is alert and oriented to person, place, and time. No cranial nerve deficit.  Skin: Skin is warm and dry. No rash noted.  Psychiatric: She has a normal mood and affect. Her behavior is normal.    ED Course  Procedures (including critical care time)  DIAGNOSTIC STUDIES: Oxygen Saturation is 93% on room air, adequate by my interpretation.    COORDINATION OF CARE: 9:45PM-Discussed treatment plan with pt and pt agreed to plan.   Labs Reviewed - No data to display Dg Chest 2 View  10/11/2011  *RADIOLOGY REPORT*  Clinical Data: Wheezing.  SOB.  CHEST - 2 VIEW  Comparison: 09/23/2011  Findings: Heart size is normal.  No pleural effusion or edema.  No airspace consolidation identified.  There is a small hiatal hernia.  The visualized osseous structures are significant for a compression deformity within the lower thoracic spine which is unchanged from previous exam.  IMPRESSION:  1.  No acute cardiopulmonary abnormalities.  Original Report Authenticated By: Rosealee Albee, M.D.     1. COPD (chronic obstructive pulmonary disease)   2. Otitis media    Scheduled Meds:   . albuterol  5 mg Nebulization Once  . albuterol  5 mg Nebulization Once  . albuterol      . azithromycin  500 mg Oral Once  . ipratropium      . ipratropium  0.5 mg Nebulization Once  . methylPREDNISolone (SOLU-MEDROL) injection  125 mg Intravenous Once   Continuous Infusions:   . DISCONTD: sodium chloride Stopped (10/12/11 0106)   PRN Meds:.    MDM  I personally performed the services described in this documentation, which was scribed in my presence. The recorded information has been reviewed and considered.          Nelia Shi, MD 10/12/11 1057

## 2011-10-11 NOTE — Discharge Instructions (Signed)

## 2011-10-12 NOTE — Progress Notes (Signed)
9528  Patient has had additional duoneb. Feels better. Lungs without wheezing. Good air movement. Ready for discharge home.

## 2011-10-12 NOTE — ED Notes (Signed)
A&ox4; in no distress; instructions reviewed and f/u information provided; verbalizes understanding. Left in c/o husband for transport home.

## 2011-10-19 ENCOUNTER — Encounter (HOSPITAL_COMMUNITY): Payer: Self-pay | Admitting: *Deleted

## 2011-10-19 ENCOUNTER — Emergency Department (HOSPITAL_COMMUNITY)
Admission: EM | Admit: 2011-10-19 | Discharge: 2011-10-19 | Disposition: A | Discharge status: Home / Self Care | Payer: Self-pay | Attending: Emergency Medicine | Admitting: Emergency Medicine

## 2011-10-19 DIAGNOSIS — K859 Acute pancreatitis without necrosis or infection, unspecified: Secondary | ICD-10-CM

## 2011-10-19 DIAGNOSIS — F17200 Nicotine dependence, unspecified, uncomplicated: Secondary | ICD-10-CM | POA: Insufficient documentation

## 2011-10-19 DIAGNOSIS — R109 Unspecified abdominal pain: Secondary | ICD-10-CM | POA: Insufficient documentation

## 2011-10-19 DIAGNOSIS — M549 Dorsalgia, unspecified: Secondary | ICD-10-CM | POA: Insufficient documentation

## 2011-10-19 DIAGNOSIS — R10819 Abdominal tenderness, unspecified site: Secondary | ICD-10-CM | POA: Insufficient documentation

## 2011-10-19 LAB — BASIC METABOLIC PANEL
BUN: 19 mg/dL (ref 6–23)
CO2: 26 mEq/L (ref 19–32)
Chloride: 106 mEq/L (ref 96–112)
GFR calc Af Amer: >90 mL/min (ref >90)
GFR calc non Af Amer: >90 mL/min (ref >90)
Potassium: 3.8 mEq/L (ref 3.5–5.1)
Sodium: 140 mEq/L (ref 135–145)

## 2011-10-19 LAB — DIFFERENTIAL
Basophils Absolute: 0 10*3/uL (ref 0.0–0.1)
Basophils Relative: 0 % (ref 0–1)
Monocytes Relative: 6 % (ref 3–12)
Neutro Abs: 4.6 10*3/uL (ref 1.7–7.7)
Neutrophils Relative %: 58 % (ref 43–77)

## 2011-10-19 LAB — URINALYSIS, ROUTINE W REFLEX MICROSCOPIC
Bilirubin Urine: NEGATIVE
Ketones, ur: NEGATIVE mg/dL
Leukocytes, UA: NEGATIVE
Nitrite: NEGATIVE
Specific Gravity, Urine: 1.03 (ref 1.005–1.030)
Urobilinogen, UA: 0.2 mg/dL (ref 0.0–1.0)
pH: 6 (ref 5.0–8.0)

## 2011-10-19 LAB — CBC
Hemoglobin: 12.9 g/dL (ref 12.0–15.0)
MCHC: 33.8 g/dL (ref 30.0–36.0)
Platelets: 298 10*3/uL (ref 150–400)
RBC: 4.18 MIL/uL (ref 3.87–5.11)

## 2011-10-19 LAB — LIPASE, BLOOD: Lipase: 29 U/L (ref 11–59)

## 2011-10-19 MED ORDER — HYDROMORPHONE HCL PF 2 MG/ML IJ SOLN
INTRAMUSCULAR | Status: AC
  Administered 2011-10-19: 1 mg
  Filled 2011-10-19: qty 1

## 2011-10-19 MED ORDER — HYDROCODONE-ACETAMINOPHEN 5-500 MG PO TABS: 1.0000 | ORAL_TABLET | Freq: Four times a day (QID) | ORAL | Status: AC | PRN

## 2011-10-19 MED ORDER — ONDANSETRON HCL 4 MG PO TABS: 4.0000 mg | ORAL_TABLET | Freq: Four times a day (QID) | ORAL | Status: AC

## 2011-10-19 MED ORDER — SODIUM CHLORIDE 0.9 % IV BOLUS (SEPSIS)
1000.0000 mL | Freq: Once | INTRAVENOUS | Status: AC
  Administered 2011-10-19: 1000 mL via INTRAVENOUS

## 2011-10-19 MED ORDER — HYDROMORPHONE HCL PF 1 MG/ML IJ SOLN: 1.0000 mg | Freq: Once | INTRAMUSCULAR | Status: DC

## 2011-10-19 MED ORDER — HYDROMORPHONE HCL PF 1 MG/ML IJ SOLN
1.0000 mg | Freq: Once | INTRAMUSCULAR | Status: AC
  Administered 2011-10-19: 1 mg via INTRAVENOUS
  Filled 2011-10-19: qty 1

## 2011-10-19 MED ORDER — ONDANSETRON HCL 4 MG/2ML IJ SOLN
4.0000 mg | Freq: Once | INTRAMUSCULAR | Status: AC
  Administered 2011-10-19: 4 mg via INTRAVENOUS
  Filled 2011-10-19: qty 2

## 2011-10-19 NOTE — ED Provider Notes (Signed)
History     CSN: [id number]  Arrival date & time 10/19/11  0541   First MD Initiated Contact with Patient 10/19/11 0542      Chief Complaint  Patient presents with  . Abdominal Pain  . Back Pain    (Consider location/radiation/quality/duration/timing/severity/associated sxs/prior treatment) Patient is a 41 y.o. female presenting with abdominal pain. The history is provided by the patient.  Abdominal Pain The primary symptoms of the illness include abdominal pain. The primary symptoms of the illness do not include fever, shortness of breath or dysuria. The current episode started yesterday. The onset of the illness was gradual. The problem has not changed since onset. Associated with: Has chronic pancreatitis. The patient has had a change in bowel habit. Additional symptoms associated with the illness include back pain. Symptoms associated with the illness do not include chills or diaphoresis. Significant associated medical issues do not include diverticulitis.  pain located left upper quadrant. Radiates to the back. Sharp in quality. feels similar to previous bouts of pancreatitis.  Has had loose stools. No blood in stools. Nausea no vomiting.  Past Medical History  Diagnosis Date  . Asthma   . Depression   . Migraine   . Pancreatitis chronic     Past Surgical History  Procedure Date  . Cholecystectomy   . Tonsillectomy   . Abdominal hysterectomy     Family History  Problem Relation Age of Onset  . Asthma Other   . Coronary artery disease Neg Hx     History  Substance Use Topics  . Smoking status: Current Everyday Smoker -- 1.0 packs/day for 15 years    Types: Cigarettes  . Smokeless tobacco: Current User  . Alcohol Use: No    OB History    Grav Para Term Preterm Abortions TAB SAB Ect Mult Living                  Review of Systems  Constitutional: Negative for fever, chills and diaphoresis.  HENT: Negative for neck pain and neck stiffness.   Eyes: Negative  for pain.  Respiratory: Negative for shortness of breath.   Cardiovascular: Negative for chest pain.  Gastrointestinal: Positive for abdominal pain.  Genitourinary: Negative for dysuria.  Musculoskeletal: Positive for back pain.  Skin: Negative for rash.  Neurological: Negative for headaches.  All other systems reviewed and are negative.    Allergies  Penicillins; Prednisone; and Sulfa antibiotics  Home Medications   Current Outpatient Rx  Name Route Sig Dispense Refill  . ALBUTEROL SULFATE HFA 108 (90 BASE) MCG/ACT IN AERS Inhalation Inhale 2 puffs into the lungs daily as needed. For shortness of breath    . ALBUTEROL SULFATE (2.5 MG/3ML) 0.083% IN NEBU Nebulization Take 2.5 mg by nebulization every 6 (six) hours as needed. For shortness of breath     . AMLODIPINE BESYLATE 10 MG PO TABS Oral Take 10 mg by mouth daily.      Marland Kitchen CARBAMAZEPINE 200 MG PO TABS Oral Take 200 mg by mouth 2 (two) times daily.    Marland Kitchen CITALOPRAM HYDROBROMIDE 20 MG PO TABS Oral Take 20 mg by mouth daily.    Marland Kitchen CLONAZEPAM 0.5 MG PO TABS Oral Take 0.5 mg by mouth 3 (three) times daily as needed.    Marland Kitchen DOCUSATE SODIUM 100 MG PO CAPS Oral Take 100 mg by mouth at bedtime.     Marland Kitchen FLUTICASONE PROPIONATE  HFA 44 MCG/ACT IN AERO Inhalation Inhale 1 puff into the lungs 2 (two) times  daily. 1 Inhaler 1  . HYDROCODONE-ACETAMINOPHEN 10-325 MG PO TABS Oral Take 1 tablet by mouth every 6 (six) hours as needed for pain. pain 30 tablet 0  . METHYLPREDNISOLONE 4 MG PO KIT  follow package directions 21 tablet 0  . OMEPRAZOLE 20 MG PO CPDR Oral Take 20 mg by mouth 3 (three) times daily.        BP 131/80  Pulse 85  Temp 98 F (36.7 C)  Resp 20  Wt 163 lb (73.936 kg)  SpO2 95%  Physical Exam  Constitutional: She is oriented to person, place, and time. She appears well-developed and well-nourished.  HENT:  Head: Normocephalic and atraumatic.  Eyes: Conjunctivae and EOM are normal. Pupils are equal, round, and reactive to light.    Neck: Trachea normal. Neck supple. No thyromegaly present.  Cardiovascular: Normal rate, regular rhythm, S1 normal, S2 normal and normal pulses.     No systolic murmur is present   No diastolic murmur is present  Pulses:      Radial pulses are 2+ on the right side, and 2+ on the left side.  Pulmonary/Chest: Effort normal and breath sounds normal. She has no wheezes. She has no rhonchi. She has no rales. She exhibits no tenderness.  Abdominal: Soft. Normal appearance and bowel sounds are normal. She exhibits no distension and no mass. There is no rebound, no guarding, no CVA tenderness and negative Murphy's sign.       Tender epigastric and left upper quadrant without peritonitis  Musculoskeletal:       BLE:s Calves nontender, no cords or erythema, negative Homans sign  Neurological: She is alert and oriented to person, place, and time. She has normal strength. No cranial nerve deficit or sensory deficit. GCS eye subscore is 4. GCS verbal subscore is 5. GCS motor subscore is 6.  Skin: Skin is warm and dry. No rash noted. She is not diaphoretic.  Psychiatric: Her speech is normal.       Cooperative and appropriate    ED Course  Procedures (including critical care time) Results for orders placed during the hospital encounter of 10/19/11  CBC      Component Value Range   WBC 7.9  4.0 - 10.5 (K/uL)   RBC 4.18  3.87 - 5.11 (MIL/uL)   Hemoglobin 12.9  12.0 - 15.0 (g/dL)   HCT 16.1  09.6 - 04.5 (%)   MCV 91.4  78.0 - 100.0 (fL)   MCH 30.9  26.0 - 34.0 (pg)   MCHC 33.8  30.0 - 36.0 (g/dL)   RDW 40.9  81.1 - 91.4 (%)   Platelets 298  150 - 400 (K/uL)  DIFFERENTIAL      Component Value Range   Neutrophils Relative 58  43 - 77 (%)   Neutro Abs 4.6  1.7 - 7.7 (K/uL)   Lymphocytes Relative 32  12 - 46 (%)   Lymphs Abs 2.5  0.7 - 4.0 (K/uL)   Monocytes Relative 6  3 - 12 (%)   Monocytes Absolute 0.5  0.1 - 1.0 (K/uL)   Eosinophils Relative 3  0 - 5 (%)   Eosinophils Absolute 0.3  0.0 -  0.7 (K/uL)   Basophils Relative 0  0 - 1 (%)   Basophils Absolute 0.0  0.0 - 0.1 (K/uL)  BASIC METABOLIC PANEL      Component Value Range   Sodium 140  135 - 145 (mEq/L)   Potassium 3.8  3.5 - 5.1 (mEq/L)   Chloride  106  96 - 112 (mEq/L)   CO2 26  19 - 32 (mEq/L)   Glucose, Bld 124 (*) 70 - 99 (mg/dL)   BUN 19  6 - 23 (mg/dL)   Creatinine, Ser 1.61  0.50 - 1.10 (mg/dL)   Calcium 9.2  8.4 - 09.6 (mg/dL)   GFR calc non Af Amer >90  >90 (mL/min)   GFR calc Af Amer >90  >90 (mL/min)  LIPASE, BLOOD      Component Value Range   Lipase 29  11 - 59 (U/L)     IV fluids and Dilaudid. Zofran for nausea. Serial examinations with improving condition. Labs reviewed as above.   MDM   Presentation consistent with acute on chronic pancreatitis. Improved with IV Dilaudid. Patient has a specialist at [facility]. Plan outpatient followup. Prescription for Vicodin provided as needed. Patient has medications otherwise  - stable for discharge home at this time.        Sunnie Nielsen, MD 10/19/11 [phone]

## 2011-10-19 NOTE — ED Notes (Signed)
Pt c/o of continued pain on her right side towards her back at a level 7. Advised to give time for pain medication to work and also applied a heating pad for comfort

## 2011-10-19 NOTE — Discharge Instructions (Signed)
Acute Pancreatitis The pancreas is a large gland located behind your stomach. It produces (secretes) enzymes. These enzymes help digest food. It also releases the hormones glucagon and insulin. These hormones help regulate blood sugar. When the pancreas becomes inflamed, the disease is called pancreatitis. Inflammation of the pancreas occurs when enzymes from the pancreas begin attacking and digesting the pancreas. CAUSES  Most cases ofsudden onset (acute) pancreatitis are caused by:  Alcohol abuse.   Gallstones.  Other less common causes are:  Some medications.   Exposure to certain chemicals   Infection.   Damage caused by an accident (trauma).   Surgery of the belly (abdomen).  SYMPTOMS  Acute pancreatitis usually begins with pain in the upper abdomen and may radiate to the back. This pain may last a couple days. The constant pain varies from mild to severe. The acute form of this disease may vary from mild, nonspecific abdominal pain to profound shock with coma. About 1 in 5 cases are severe. These patients become dehydrated and develop low blood pressure. In severe cases, bleeding into the pancreas can lead to shock and death. The lungs, heart, and kidneys may fail. DIAGNOSIS  Your caregiver will form a clinical opinion after giving you an exam. Laboratory work is used to confirm this diagnosis. Often,a digestive enzyme from the pancreas (serum amylase) and other enzymes are elevated. Sugars and fats (lipids) in the blood may be elevated. There may also be changes in the following levels: calcium, magnesium, potassium, chloride and bicarbonate (chemicals in the blood). X-rays, a CT scan, or ultrasound of your abdomen may be necessary to search for other causes of your abdominal pain. TREATMENT  Most pancreatitis requires treatment of symptoms. Most acute attacks last a couple of days. Your caregiver can discuss the treatment options with you.  If complications occur, hospitalization  may be necessary for pain control and intravenous (IV) fluid replacement.   Sometimes, a tube may be put into the stomach to control vomiting.   Food may not be allowed for 3 to 4 days. This gives the pancreas time to rest. Giving the pancreas a rest means there is no stimulation that would produce more enzymes and cause more damage.   Medicines (antibiotics) that kill germs may be given if infection is the cause.   Sometimes, surgery may be required.   Following an acute attack, your caregiver will determine the cause, if possible, and offer suggestions to prevent recurrences.  HOME CARE INSTRUCTIONS   Eat smaller, more frequent meals. This reduces the amount of digestive juices the pancreas produces.   Decrease the amount of fat in your diet. This may help reduce loose, diarrheal stools.   Drink enough water and fluids to keep your urine clear or pale yellow. This is to avoid dehydration which can cause increased pain.   Talk to your caregiver about pain relievers or other medicines that may help.   Avoid anything that may have triggered your pancreatitis (for example, alcohol).   Follow the diet advised by your caregiver. Do not advance the diet too soon.   Take medicines as prescribed.   Get plenty of rest.   Check your blood sugar at home as directed by your caregiver.   If your caregiver has given you a follow-up appointment, it is very important to keep that appointment. Not keeping the appointment could result in a lasting (chronic) or permanent injury, pain, and disability. If there is any problem keeping the appointment, you must call to reschedule.    SEEK MEDICAL CARE IF:   You are not recovering in the time described by your caregiver.   You have persistent pain, weakness, or feel sick to your stomach (nauseous).   You have recovered and then have another bout of pain.  SEEK IMMEDIATE MEDICAL CARE IF:   You are unable to eat or keep fluids down.   Your pain  increases a lot or changes.   You have an oral temperature above 102 F (38.9 C), not controlled by medicine.   Your skin or the white part of your eyes look yellow (jaundice).   You develop vomiting.   You feel dizzy or faint.   Your blood sugar is high (over 300).  MAKE SURE YOU:   Understand these instructions.   Will watch your condition.   Will get help right away if you are not doing well or get worse.  Document Released: 07/13/2005 Document Revised: 07/02/2011 Document Reviewed: 02/24/2008 [employer] Patient Information 2012 [employer]. 

## 2011-10-19 NOTE — ED Notes (Signed)
Pt c/o epigastric and mid back pain (right sided).

## 2011-11-06 ENCOUNTER — Emergency Department (HOSPITAL_COMMUNITY): Payer: Self-pay

## 2011-11-06 ENCOUNTER — Other Ambulatory Visit: Payer: Self-pay

## 2011-11-06 ENCOUNTER — Emergency Department (HOSPITAL_COMMUNITY)
Admission: EM | Admit: 2011-11-06 | Discharge: 2011-11-07 | Disposition: A | Discharge status: Home / Self Care | Payer: Self-pay | Attending: Emergency Medicine | Admitting: Emergency Medicine

## 2011-11-06 ENCOUNTER — Encounter (HOSPITAL_COMMUNITY): Payer: Self-pay | Admitting: Emergency Medicine

## 2011-11-06 DIAGNOSIS — Z79899 Other long term (current) drug therapy: Secondary | ICD-10-CM | POA: Insufficient documentation

## 2011-11-06 DIAGNOSIS — K861 Other chronic pancreatitis: Secondary | ICD-10-CM | POA: Insufficient documentation

## 2011-11-06 DIAGNOSIS — R109 Unspecified abdominal pain: Secondary | ICD-10-CM | POA: Insufficient documentation

## 2011-11-06 DIAGNOSIS — R110 Nausea: Secondary | ICD-10-CM | POA: Insufficient documentation

## 2011-11-06 DIAGNOSIS — J45909 Unspecified asthma, uncomplicated: Secondary | ICD-10-CM | POA: Insufficient documentation

## 2011-11-06 DIAGNOSIS — R079 Chest pain, unspecified: Secondary | ICD-10-CM | POA: Insufficient documentation

## 2011-11-06 DIAGNOSIS — M25519 Pain in unspecified shoulder: Secondary | ICD-10-CM | POA: Insufficient documentation

## 2011-11-06 LAB — URINALYSIS, ROUTINE W REFLEX MICROSCOPIC
Bilirubin Urine: NEGATIVE
Glucose, UA: NEGATIVE mg/dL
Hgb urine dipstick: NEGATIVE
Protein, ur: NEGATIVE mg/dL
Specific Gravity, Urine: >1.03 — ABNORMAL HIGH (ref 1.005–1.030)
Urobilinogen, UA: 0.2 mg/dL (ref 0.0–1.0)

## 2011-11-06 LAB — DIFFERENTIAL
Basophils Absolute: 0 10*3/uL (ref 0.0–0.1)
Basophils Relative: 0 % (ref 0–1)
Eosinophils Absolute: 0.4 10*3/uL (ref 0.0–0.7)
Eosinophils Relative: 4 % (ref 0–5)
Lymphocytes Relative: 24 % (ref 12–46)
Lymphs Abs: 2.7 10*3/uL (ref 0.7–4.0)
Neutrophils Relative %: 67 % (ref 43–77)

## 2011-11-06 LAB — COMPREHENSIVE METABOLIC PANEL
ALT: 37 U/L — ABNORMAL HIGH (ref 0–35)
AST: 25 U/L (ref 0–37)
Alkaline Phosphatase: 84 U/L (ref 39–117)
CO2: 26 mEq/L (ref 19–32)
Creatinine, Ser: 0.64 mg/dL (ref 0.50–1.10)
GFR calc Af Amer: >90 mL/min (ref >90)
Glucose, Bld: 129 mg/dL — ABNORMAL HIGH (ref 70–99)
Potassium: 3.9 mEq/L (ref 3.5–5.1)
Sodium: 136 mEq/L (ref 135–145)
Total Protein: 7.2 g/dL (ref 6.0–8.3)

## 2011-11-06 LAB — CBC
MCHC: 34.4 g/dL (ref 30.0–36.0)
Platelets: 221 10*3/uL (ref 150–400)
RBC: 4.46 MIL/uL (ref 3.87–5.11)
WBC: 11.5 10*3/uL — ABNORMAL HIGH (ref 4.0–10.5)

## 2011-11-06 MED ORDER — SODIUM CHLORIDE 0.9 % IV BOLUS (SEPSIS)
1000.0000 mL | Freq: Once | INTRAVENOUS | Status: AC
  Administered 2011-11-06: 1000 mL via INTRAVENOUS

## 2011-11-06 MED ORDER — ONDANSETRON HCL 4 MG/2ML IJ SOLN
4.0000 mg | Freq: Once | INTRAMUSCULAR | Status: AC
  Administered 2011-11-06: 4 mg via INTRAVENOUS
  Filled 2011-11-06: qty 2

## 2011-11-06 MED ORDER — HYDROMORPHONE HCL PF 1 MG/ML IJ SOLN
INTRAMUSCULAR | Status: AC
  Filled 2011-11-06: qty 1

## 2011-11-06 MED ORDER — HYDROMORPHONE HCL PF 2 MG/ML IJ SOLN
1.0000 mg | Freq: Once | INTRAMUSCULAR | Status: AC
  Administered 2011-11-06: 1 mg via INTRAMUSCULAR

## 2011-11-06 NOTE — ED Notes (Signed)
Pt with CP in the right side of chest radiating to right arm.  Back pain and nausea.  Pt says she has pancreatitis and states that this is how it presents.

## 2011-11-06 NOTE — ED Notes (Signed)
Dr Deretha Emory shown ekg.  No STEMI

## 2011-11-06 NOTE — ED Notes (Signed)
Pt c/o pain to upper abdomen that radiates to her back and right breast.

## 2011-11-06 NOTE — ED Provider Notes (Signed)
History  This chart was scribed for [employer]. Colon Branch, MD by Bennett Scrape and Temilola Ajibulu. This patient was seen in room APA12/APA12 and the patient's care was started at 11:25PM.  CSN: [id number]  Arrival date & time 11/06/11  2025   First MD Initiated Contact with Patient 11/06/11 2319      Chief Complaint  Patient presents with  . Chest Pain  . Shoulder Pain  . Nausea     The history is provided by the patient. No language interpreter was used.    [name] is a 41 y.o. female with a h/o asthma and chronic pancreatitis who presents to the Emergency Department complaining of sudden onset, gradually improving, constant chest pain described as sharp with associated nausea and emesis that started a few minutes after she woke up this morning. The pain is located under the right breast with radiation to the upper right back. She rates her pain a 6 out of 10 at its worst. Pt reports 4 to 5 episodes of non-bloody emesis since onset of chest pain today. Pt states that the pain is currently easing up and now feels like a dull and sore ache. Pt states that she experienced sternal chest pain that radiates to the back with nausea with previous episodes of pancreatitis but she denies that this pain is similar to prior pancreatitis episodes. She denies any modifying factors. She reports taking hydrocodone previously prescribed by her PCP for pancreatitis with no improvement in symptoms. She also c/o 2 weeks of constipation described as 1 to 2 small bowel movements per week. She reports using 2 enemas and stool softners 6 days ago with mild improvement in constipation. She reports having prior episodes of constipation in which she used miralax to become regular again. She denies fever, sore throat, congestion, visual disturbance, leg swelling, cough, SOB, abdominal pain, dysuria, hematuria, rash and HA as associated symptoms.  She is a former smoker but denies alcohol use.  PCP Dr.  Sherwood Gambler    Past Medical History  Diagnosis Date  . Asthma   . Depression   . Migraine   . Pancreatitis chronic     Past Surgical History  Procedure Date  . Cholecystectomy   . Tonsillectomy   . Abdominal hysterectomy     Family History  Problem Relation Age of Onset  . Asthma Other   . Coronary artery disease Neg Hx     History  Substance Use Topics  . Smoking status: Former Smoker -- 1.0 packs/day for 15 years    Types: Cigarettes  . Smokeless tobacco: Current User  . Alcohol Use: No    Review of Systems  A complete 10 system review of systems was obtained and all systems are negative except as noted in the HPI and PMH.   Allergies  Sulfa antibiotics; Penicillins; and Prednisone  Home Medications   Current Outpatient Rx  Name Route Sig Dispense Refill  . ALBUTEROL SULFATE HFA 108 (90 BASE) MCG/ACT IN AERS Inhalation Inhale 2 puffs into the lungs daily as needed. For shortness of breath    . ALBUTEROL SULFATE (2.5 MG/3ML) 0.083% IN NEBU Nebulization Take 2.5 mg by nebulization every 6 (six) hours as needed. For shortness of breath     . AMLODIPINE BESYLATE 10 MG PO TABS Oral Take 10 mg by mouth daily.      Marland Kitchen CARBAMAZEPINE 200 MG PO TABS Oral Take 200 mg by mouth 2 (two) times daily.    Marland Kitchen CITALOPRAM HYDROBROMIDE 20  MG PO TABS Oral Take 20 mg by mouth daily.    Marland Kitchen CLONAZEPAM 0.5 MG PO TABS Oral Take 0.5 mg by mouth 3 (three) times daily as needed.    Marland Kitchen DOCUSATE SODIUM 100 MG PO CAPS Oral Take 100 mg by mouth at bedtime.     Marland Kitchen FLUTICASONE PROPIONATE  HFA 44 MCG/ACT IN AERO Inhalation Inhale 1 puff into the lungs 2 (two) times daily. 1 Inhaler 1  . HYDROCODONE-ACETAMINOPHEN 10-325 MG PO TABS Oral Take 1 tablet by mouth every 6 (six) hours as needed for pain. pain 30 tablet 0  . OMEPRAZOLE 20 MG PO CPDR Oral Take 20 mg by mouth 3 (three) times daily.        Triage Vitals: BP 140/87  Pulse 102  Temp(Src) 97.9 F (36.6 C) (Oral)  Resp 16  Ht 5\' 2"  (1.575 m)  Wt 164  lb (74.39 kg)  BMI 30.00 kg/m2  SpO2 100%  Physical Exam  Nursing note and vitals reviewed. Constitutional: She is oriented to person, place, and time. She appears well-developed and well-nourished.  HENT:  Head: Normocephalic and atraumatic.  Eyes: Conjunctivae and EOM are normal.  Neck: Normal range of motion. Neck supple.  Cardiovascular: Normal rate, regular rhythm and normal heart sounds.  Exam reveals no gallop and no friction rub.   No murmur heard. Pulmonary/Chest: Effort normal and breath sounds normal. She has no wheezes. She has no rales.       No rhonci or rub  Abdominal: Soft. There is tenderness (Mild epigastric tenderness). There is no rebound and no guarding.       hyperactive bowel sounds  Musculoskeletal: Normal range of motion. She exhibits no edema (No lower leg swelling).  Neurological: She is alert and oriented to person, place, and time.  Skin: Skin is warm and dry.  Psychiatric: She has a normal mood and affect. Her behavior is normal.    ED Course  Procedures (including critical care time)  DIAGNOSTIC STUDIES: Oxygen Saturation is 100% on room air, normal by my interpretation.    COORDINATION OF CARE: 11:32PM-Discussed IV fluids, blood work, medications and CT scan with pt and pt agreed to plan. Discussed pt using miralax once or twice a day for constipation until she is regular again and pt agreed. [id number] Patient if having a chill. Has finished contrast material. Awaiting CT. Additional medications given.  Results for orders placed during the hospital encounter of 11/06/11  CBC      Component Value Range   WBC 11.5 (*) 4.0 - 10.5 (K/uL)   RBC 4.46  3.87 - 5.11 (MIL/uL)   Hemoglobin 13.8  12.0 - 15.0 (g/dL)   HCT 56.2  13.0 - 86.5 (%)   MCV 89.9  78.0 - 100.0 (fL)   MCH 30.9  26.0 - 34.0 (pg)   MCHC 34.4  30.0 - 36.0 (g/dL)   RDW 78.4  69.6 - 29.5 (%)   Platelets 221  150 - 400 (K/uL)  DIFFERENTIAL      Component Value Range   Neutrophils Relative  67  43 - 77 (%)   Neutro Abs 7.7  1.7 - 7.7 (K/uL)   Lymphocytes Relative 24  12 - 46 (%)   Lymphs Abs 2.7  0.7 - 4.0 (K/uL)   Monocytes Relative 6  3 - 12 (%)   Monocytes Absolute 0.7  0.1 - 1.0 (K/uL)   Eosinophils Relative 4  0 - 5 (%)   Eosinophils Absolute 0.4  0.0 - 0.7 (  K/uL)   Basophils Relative 0  0 - 1 (%)   Basophils Absolute 0.0  0.0 - 0.1 (K/uL)  COMPREHENSIVE METABOLIC PANEL      Component Value Range   Sodium 136  135 - 145 (mEq/L)   Potassium 3.9  3.5 - 5.1 (mEq/L)   Chloride 100  96 - 112 (mEq/L)   CO2 26  19 - 32 (mEq/L)   Glucose, Bld 129 (*) 70 - 99 (mg/dL)   BUN 18  6 - 23 (mg/dL)   Creatinine, Ser 1.61  0.50 - 1.10 (mg/dL)   Calcium 9.6  8.4 - 09.6 (mg/dL)   Total Protein 7.2  6.0 - 8.3 (g/dL)   Albumin 3.9  3.5 - 5.2 (g/dL)   AST 25  0 - 37 (U/L)   ALT 37 (*) 0 - 35 (U/L)   Alkaline Phosphatase 84  39 - 117 (U/L)   Total Bilirubin 0.4  0.3 - 1.2 (mg/dL)   GFR calc non Af Amer >90  >90 (mL/min)   GFR calc Af Amer >90  >90 (mL/min)  LIPASE, BLOOD      Component Value Range   Lipase 29  11 - 59 (U/L)  URINALYSIS, ROUTINE W REFLEX MICROSCOPIC      Component Value Range   Color, Urine AMBER (*) YELLOW    APPearance CLEAR  CLEAR    Specific Gravity, Urine >1.030 (*) 1.005 - 1.030    pH 6.0  5.0 - 8.0    Glucose, UA NEGATIVE  NEGATIVE (mg/dL)   Hgb urine dipstick NEGATIVE  NEGATIVE    Bilirubin Urine NEGATIVE  NEGATIVE    Ketones, ur NEGATIVE  NEGATIVE (mg/dL)   Protein, ur NEGATIVE  NEGATIVE (mg/dL)   Urobilinogen, UA 0.2  0.0 - 1.0 (mg/dL)   Nitrite NEGATIVE  NEGATIVE    Leukocytes, UA NEGATIVE  NEGATIVE   TROPONIN I      Component Value Range   Troponin I <0.30  <0.30 (ng/mL)   Dg Chest 2 View Dg Chest 2 View  11/06/2011  *RADIOLOGY REPORT*  Clinical Data: Chest pain.  CHEST - 2 VIEW  Comparison: 10/11/2011  Findings: Lungs are clear. No pleural effusion or pneumothorax.  Cardiomediastinal silhouette is within normal limits.  Mild degenerative  changes of the visualized thoracolumbar spine.  IMPRESSION: No evidence of acute cardiopulmonary disease.  Original Report Authenticated By: Charline Bills, M.D.   Ct Abdomen Pelvis W Contrast  11/07/2011  *RADIOLOGY REPORT*  Clinical Data: Right chest pain, back pain, nausea/vomiting, history of chronic pancreatitis no prior cholecystectomy and hysterectomy  CT ABDOMEN AND PELVIS WITH CONTRAST  Technique:  Multidetector CT imaging of the abdomen and pelvis was performed following the standard protocol during bolus administration of intravenous contrast.  Contrast: OMNIPAQUE IOHEXOL 300 MG/ML  SOLN  Comparison: 07/14/2011  Findings: Scarring versus atelectasis in the lingula and right middle lobe.  Moderate hiatal hernia.  Liver, spleen, pancreas, and adrenal glands are within normal limits.  Status post cholecystectomy.  No intrahepatic or extrahepatic ductal dilatation.  Kidneys are within normal limits.  No hydronephrosis.  No evidence of bowel obstruction.  Normal appendix.  No evidence of abdominal aortic aneurysm.  No abdominopelvic ascites.  No suspicious abdominopelvic lymphadenopathy.  Status post hysterectomy.  Ovaries are unremarkable.  Bladder is within normal limits.  Superior endplate degenerative changes at T12, unchanged.  IMPRESSION: No evidence of bowel obstruction.  Normal appendix.  Moderate hiatal hernia.  No CT findings to account for the patient's abdominal  pain.  Original Report Authenticated By: Charline Bills, M.D.     Date: 11/07/2011  2027  Rate: 100  Rhythm: normal sinus rhythm  QRS Axis: normal  Intervals: normal  ST/T Wave abnormalities: normal  Conduction Disutrbances:incomplete RBBB  Narrative Interpretation:   Old EKG Reviewed: changes noted c/w 07/14/2011, prolonged QT no longer present  MDM  Patient with a history of pancreatitis presents with  pain to the epigastric area, with radiation across the anterior chest and to her back. In addition she reports  a problem with recurrent constipation requiring enemas x2 to facilitate a bowel movement. She's been given IV fluids, analgesics, antibiotics, with improvement in her pain. Labs are unremarkable with a normal lipase. Chest x-ray without acute findings CT without acute findings. Pt stable in ED with no significant deterioration in condition.The patient appears reasonably screened and/or stabilized for discharge and I doubt any other medical condition or other [facility] requiring further screening, evaluation, or treatment in the ED at this time prior to discharge.  I personally performed the services described in this documentation, which was scribed in my presence. The recorded information has been reviewed and considered.  MDM Reviewed: nursing note, vitals and previous chart Reviewed previous: labs Interpretation: labs, ECG, x-ray and CT scan          Nicoletta Dress. Colon Branch, MD 11/07/11 [phone]

## 2011-11-07 MED ORDER — PROMETHAZINE HCL 25 MG/ML IJ SOLN
12.5000 mg | Freq: Once | INTRAMUSCULAR | Status: AC
  Administered 2011-11-07: 03:00:00 via INTRAVENOUS
  Filled 2011-11-07: qty 1

## 2011-11-07 MED ORDER — HYDROCODONE-ACETAMINOPHEN 5-325 MG PO TABS: 1.0000 | ORAL_TABLET | ORAL | Status: AC | PRN

## 2011-11-07 MED ORDER — IOHEXOL 300 MG/ML  SOLN
100.0000 mL | Freq: Once | INTRAMUSCULAR | Status: AC | PRN
  Administered 2011-11-07: 100 mL via INTRAVENOUS

## 2011-11-07 MED ORDER — PROMETHAZINE HCL 25 MG/ML IJ SOLN
12.5000 mg | Freq: Once | INTRAMUSCULAR | Status: AC
  Administered 2011-11-07: 12.5 mg via INTRAVENOUS
  Filled 2011-11-07: qty 1

## 2011-11-07 MED ORDER — HYDROMORPHONE HCL PF 2 MG/ML IJ SOLN
1.0000 mg | Freq: Once | INTRAMUSCULAR | Status: AC
  Administered 2011-11-07: 1 mg via INTRAMUSCULAR
  Filled 2011-11-07: qty 1

## 2011-11-07 MED ORDER — PROMETHAZINE HCL 25 MG PO TABS: 12.5000 mg | ORAL_TABLET | Freq: Four times a day (QID) | ORAL | Status: DC | PRN

## 2011-11-07 NOTE — Discharge Instructions (Signed)
Your blood work today here was normal. Your chest x-ray and CT scan both were normal. We have given you fluids and pain medicine while you were here. Drink lots of fluids and continue your home medicines. He has the pain and nausea medicine as needed. Followup with your doctor.   Abdominal Pain Many things can cause belly (abdominal) pain. Most times, the belly pain is not dangerous. The amount of belly pain does not tell how serious the problem may be. Many cases of belly pain can be watched and treated at home. HOME CARE   Do not take medicines that help you go poop (laxatives) unless told to by your doctor.   Only take medicine as told by your doctor.   Eat or drink as told by your doctor. Your doctor will tell you if you should be on a special diet.  GET HELP RIGHT AWAY IF:   The pain does not go away.   You have a fever.   You keep throwing up (vomiting).   The pain changes and is only in the right or left part of the belly.   You have bloody or tarry looking poop.  MAKE SURE YOU:   Understand these instructions.   Will watch your condition.   Will get help right away if you are not doing well or get worse.  Document Released: 12/30/2007 Document Revised: 07/02/2011 Document Reviewed: 07/29/2009 [facility] Patient Information 2012 [place], [place].

## 2011-11-07 NOTE — ED Notes (Signed)
Pt c/o nausea orders received from dr. Colon Branch

## 2011-11-07 NOTE — ED Notes (Signed)
Pt states she is feeling better .

## 2011-11-07 NOTE — ED Notes (Signed)
Pt taken to ct 

## 2011-12-20 ENCOUNTER — Emergency Department (HOSPITAL_COMMUNITY)
Admission: EM | Admit: 2011-12-20 | Discharge: 2011-12-21 | Disposition: A | Discharge status: Home / Self Care | Payer: Self-pay | Attending: Emergency Medicine | Admitting: Emergency Medicine

## 2011-12-20 ENCOUNTER — Encounter (HOSPITAL_COMMUNITY): Payer: Self-pay | Admitting: *Deleted

## 2011-12-20 DIAGNOSIS — K861 Other chronic pancreatitis: Secondary | ICD-10-CM | POA: Insufficient documentation

## 2011-12-20 DIAGNOSIS — R109 Unspecified abdominal pain: Secondary | ICD-10-CM | POA: Insufficient documentation

## 2011-12-20 DIAGNOSIS — R112 Nausea with vomiting, unspecified: Secondary | ICD-10-CM | POA: Insufficient documentation

## 2011-12-20 DIAGNOSIS — R10819 Abdominal tenderness, unspecified site: Secondary | ICD-10-CM | POA: Insufficient documentation

## 2011-12-20 LAB — CBC
HCT: 39 % (ref 36.0–46.0)
Hemoglobin: 13.3 g/dL (ref 12.0–15.0)
MCH: 30.5 pg (ref 26.0–34.0)
MCV: 89.4 fL (ref 78.0–100.0)
RBC: 4.36 MIL/uL (ref 3.87–5.11)
RDW: 11.8 % (ref 11.5–15.5)
WBC: 10 10*3/uL (ref 4.0–10.5)

## 2011-12-20 LAB — COMPREHENSIVE METABOLIC PANEL
Alkaline Phosphatase: 77 U/L (ref 39–117)
BUN: 14 mg/dL (ref 6–23)
CO2: 26 mEq/L (ref 19–32)
Chloride: 99 mEq/L (ref 96–112)
Creatinine, Ser: 0.6 mg/dL (ref 0.50–1.10)
GFR calc Af Amer: >90 mL/min (ref >90)
GFR calc non Af Amer: >90 mL/min (ref >90)
Glucose, Bld: 125 mg/dL — ABNORMAL HIGH (ref 70–99)
Total Bilirubin: 0.2 mg/dL — ABNORMAL LOW (ref 0.3–1.2)

## 2011-12-20 LAB — TROPONIN I: Troponin I: <0.3 ng/mL (ref <0.30)

## 2011-12-20 LAB — LIPASE, BLOOD: Lipase: 26 U/L (ref 11–59)

## 2011-12-20 MED ORDER — HYDROMORPHONE HCL PF 1 MG/ML IJ SOLN
1.0000 mg | Freq: Once | INTRAMUSCULAR | Status: AC
  Administered 2011-12-20: 1 mg via INTRAVENOUS
  Filled 2011-12-20: qty 1

## 2011-12-20 MED ORDER — ONDANSETRON 8 MG PO TBDP: ORAL_TABLET | ORAL | Status: AC

## 2011-12-20 MED ORDER — METOCLOPRAMIDE HCL 5 MG/ML IJ SOLN
10.0000 mg | Freq: Once | INTRAMUSCULAR | Status: AC
  Administered 2011-12-20: 10 mg via INTRAVENOUS
  Filled 2011-12-20: qty 2

## 2011-12-20 MED ORDER — PROMETHAZINE HCL 25 MG RE SUPP: 25.0000 mg | Freq: Four times a day (QID) | RECTAL | Status: DC | PRN

## 2011-12-20 MED ORDER — OXYCODONE-ACETAMINOPHEN 5-325 MG PO TABS: 2.0000 | ORAL_TABLET | ORAL | Status: AC | PRN

## 2011-12-20 MED ORDER — SODIUM CHLORIDE 0.9 % IV SOLN: INTRAVENOUS | Status: DC

## 2011-12-20 MED ORDER — SODIUM CHLORIDE 0.9 % IV BOLUS (SEPSIS)
2000.0000 mL | Freq: Once | INTRAVENOUS | Status: AC
  Administered 2011-12-20: 2000 mL via INTRAVENOUS

## 2011-12-20 NOTE — Discharge Instructions (Signed)

## 2011-12-20 NOTE — ED Notes (Signed)
Pt c/o severe abd pain radiating to her back. Pt also states she has been vomiting.

## 2011-12-20 NOTE — ED Provider Notes (Signed)
History  This chart was scribed for Hurman Horn, MD by Cherlynn Perches. The patient was seen in room APA12/APA12. Patient's care was started at 2141.  CSN: [id number]  Arrival date & time 12/20/11  2141   First MD Initiated Contact with Patient 12/20/11 2247      Chief Complaint  Patient presents with  . Abdominal Pain  . Nausea    (Consider location/radiation/quality/duration/timing/severity/associated sxs/prior treatment) HPI  Danicia ZAREAH HUNZEKER is a 41 y.o. female with a h/o asthma and chronic pancreatitis who presents to the Emergency Department complaining of 1 day of severe abdominal pain localized to upper abdomen and radiating to the chest and back with associated nausea and vomiting. Pt has had chronic abdominal pain for many years about once per month for years, with pain worsening over the past 3-4 months. Pt was seen in the ED on 11/07/2011 for similar symptoms. Pt reports having GI doctors at [facility], but has not seen them in months. Patient has a surgical history of cholecystectomy and hysterectomy. Pt denies fever, bloody vomit, blood in stool, and dysuria.   Past Medical History  Diagnosis Date  . Asthma   . Depression   . Migraine   . Pancreatitis chronic     Past Surgical History  Procedure Date  . Cholecystectomy   . Tonsillectomy   . Abdominal hysterectomy     Family History  Problem Relation Age of Onset  . Asthma Other   . Coronary artery disease Neg Hx     History  Substance Use Topics  . Smoking status: Former Smoker -- 1.0 packs/day for 15 years    Types: Cigarettes  . Smokeless tobacco: Current User  . Alcohol Use: No    OB History    Grav Para Term Preterm Abortions TAB SAB Ect Mult Living                  Review of Systems  Constitutional: Negative for fever.       10 Systems reviewed and are negative for acute change except as noted in the HPI.  HENT: Negative for congestion.   Eyes: Negative for discharge and redness.    Respiratory: Negative for cough and shortness of breath.   Cardiovascular: Negative for leg swelling.  Gastrointestinal: Positive for nausea, vomiting and abdominal pain.  Musculoskeletal: Negative for back pain.  Skin: Negative for rash.  Neurological: Negative for syncope, numbness and headaches.  Psychiatric/Behavioral:       No behavior change.    Allergies  Sulfa antibiotics; Penicillins; and Prednisone  Home Medications   Current Outpatient Rx  Name Route Sig Dispense Refill  . ALBUTEROL SULFATE HFA 108 (90 BASE) MCG/ACT IN AERS Inhalation Inhale 2 puffs into the lungs daily as needed. For shortness of breath    . ALBUTEROL SULFATE (2.5 MG/3ML) 0.083% IN NEBU Nebulization Take 2.5 mg by nebulization every 6 (six) hours as needed. For shortness of breath     . AMLODIPINE BESYLATE 10 MG PO TABS Oral Take 10 mg by mouth daily.      Marland Kitchen CARBAMAZEPINE 200 MG PO TABS Oral Take 200 mg by mouth 2 (two) times daily.    Marland Kitchen CLONAZEPAM 0.5 MG PO TABS Oral Take 0.5 mg by mouth 3 (three) times daily as needed.    Marland Kitchen DOCUSATE SODIUM 100 MG PO CAPS Oral Take 100 mg by mouth at bedtime.     Marland Kitchen ESCITALOPRAM OXALATE 20 MG PO TABS Oral Take 20 mg by mouth daily.    Marland Kitchen  FLUTICASONE PROPIONATE  HFA 44 MCG/ACT IN AERO Inhalation Inhale 1 puff into the lungs 2 (two) times daily. 1 Inhaler 1  . HYDROCODONE-ACETAMINOPHEN 10-325 MG PO TABS Oral Take 1 tablet by mouth every 6 (six) hours as needed for pain. pain 30 tablet 0  . OMEPRAZOLE 20 MG PO CPDR Oral Take 20 mg by mouth 3 (three) times daily.      Marland Kitchen ONDANSETRON 8 MG PO TBDP  8mg  ODT q4 hours prn nausea 4 tablet 0  . OXYCODONE-ACETAMINOPHEN 5-325 MG PO TABS Oral Take 2 tablets by mouth every 4 (four) hours as needed for pain. 6 tablet 0  . OXYCODONE-ACETAMINOPHEN 5-325 MG PO TABS Oral Take 2 tablets by mouth every 4 (four) hours as needed for pain. 6 tablet 0  . PROMETHAZINE HCL 25 MG RE SUPP Rectal Place 1 suppository (25 mg total) rectally every 6 (six)  hours as needed for nausea. 12 each 0  . PROMETHAZINE HCL 25 MG PO TABS Oral Take 0.5 tablets (12.5 mg total) by mouth every 6 (six) hours as needed for nausea. 10 tablet 0    Triage Vitals: BP 121/83  Pulse 99  Temp(Src) 97.8 F (36.6 C) (Oral)  Resp 20  Ht 5\' 2"  (1.575 m)  Wt 164 lb (74.39 kg)  BMI 30.00 kg/m2  SpO2 100%  Physical Exam  Nursing note and vitals reviewed. Constitutional:       Awake, alert, nontoxic appearance.  HENT:  Head: Atraumatic.  Eyes: Right eye exhibits no discharge. Left eye exhibits no discharge.  Neck: Neck supple.  Cardiovascular: Normal rate and regular rhythm.   No murmur heard. Pulmonary/Chest: Breath sounds normal. No respiratory distress. She has no wheezes. She has no rales. She exhibits no tenderness.  Abdominal: Soft. There is tenderness (moderate tenderness to epigastric area, mild diffuse abdominal tenderness). There is no rebound.  Musculoskeletal: She exhibits no tenderness.       Baseline ROM, no obvious new focal weakness.  Neurological:       Mental status and motor strength appears baseline for patient and situation.  Skin: No rash noted.  Psychiatric: She has a normal mood and affect.    ED Course  Procedures (including critical care time) ECG: Normal sinus rhythm, ventricular rate 96, normal axis, incomplete right bundle branch block, prolonged QT interval at 462 ms QTC, no acute ischemic changes noted, no significant change noted compared with April 2013 DIAGNOSTIC STUDIES: Oxygen Saturation is 100% on room air, normal by my interpretation.    COORDINATION OF CARE: 10:57PM - will give nausea medication and pain medication and perform labs. Pt agrees with initial plan.    Labs Reviewed  COMPREHENSIVE METABOLIC PANEL - Abnormal; Notable for the following:    Glucose, Bld 125 (*)    Total Bilirubin 0.2 (*)    All other components within normal limits  CBC  TROPONIN I  LIPASE, BLOOD  LAB REPORT - SCANNED   No results  found.   1. Abdominal pain   2. Chronic pancreatitis       MDM  I personally performed the services described in this documentation, which was scribed in my presence. The recorded information has been reviewed and considered. Pt stable in ED with no significant deterioration in condition.Patient / Family / Caregiver informed of clinical course, understand medical decision-making process, and agree with plan.I doubt any other EMC precluding discharge at this time including, but not necessarily limited to the following:SBI, peritonitis.   Hurman Horn, MD  12/21/11 2214 

## 2011-12-30 MED FILL — Oxycodone w/ Acetaminophen Tab 5-325 MG: ORAL | Qty: 6 | Status: AC

## 2012-02-02 ENCOUNTER — Inpatient Hospital Stay (HOSPITAL_COMMUNITY)
Admission: EM | Admit: 2012-02-02 | Discharge: 2012-02-06 | DRG: 440 | Disposition: A | Discharge status: Home / Self Care | Payer: MEDICAID | Attending: Internal Medicine | Admitting: Internal Medicine

## 2012-02-02 ENCOUNTER — Encounter (HOSPITAL_COMMUNITY): Payer: Self-pay | Admitting: Emergency Medicine

## 2012-02-02 DIAGNOSIS — H6090 Unspecified otitis externa, unspecified ear: Secondary | ICD-10-CM

## 2012-02-02 DIAGNOSIS — D509 Iron deficiency anemia, unspecified: Secondary | ICD-10-CM

## 2012-02-02 DIAGNOSIS — J45909 Unspecified asthma, uncomplicated: Secondary | ICD-10-CM | POA: Diagnosis present

## 2012-02-02 DIAGNOSIS — Z880 Allergy status to penicillin: Secondary | ICD-10-CM

## 2012-02-02 DIAGNOSIS — Z882 Allergy status to sulfonamides status: Secondary | ICD-10-CM

## 2012-02-02 DIAGNOSIS — J9601 Acute respiratory failure with hypoxia: Secondary | ICD-10-CM

## 2012-02-02 DIAGNOSIS — I10 Essential (primary) hypertension: Secondary | ICD-10-CM

## 2012-02-02 DIAGNOSIS — K219 Gastro-esophageal reflux disease without esophagitis: Secondary | ICD-10-CM | POA: Diagnosis present

## 2012-02-02 DIAGNOSIS — M62838 Other muscle spasm: Secondary | ICD-10-CM

## 2012-02-02 DIAGNOSIS — K859 Acute pancreatitis without necrosis or infection, unspecified: Secondary | ICD-10-CM

## 2012-02-02 DIAGNOSIS — R109 Unspecified abdominal pain: Secondary | ICD-10-CM

## 2012-02-02 DIAGNOSIS — F17200 Nicotine dependence, unspecified, uncomplicated: Secondary | ICD-10-CM | POA: Diagnosis present

## 2012-02-02 DIAGNOSIS — E876 Hypokalemia: Secondary | ICD-10-CM | POA: Diagnosis present

## 2012-02-02 DIAGNOSIS — E785 Hyperlipidemia, unspecified: Secondary | ICD-10-CM | POA: Diagnosis present

## 2012-02-02 DIAGNOSIS — G43909 Migraine, unspecified, not intractable, without status migrainosus: Secondary | ICD-10-CM | POA: Diagnosis present

## 2012-02-02 DIAGNOSIS — K222 Esophageal obstruction: Secondary | ICD-10-CM

## 2012-02-02 DIAGNOSIS — K449 Diaphragmatic hernia without obstruction or gangrene: Secondary | ICD-10-CM

## 2012-02-02 DIAGNOSIS — K8500 Idiopathic acute pancreatitis without necrosis or infection: Secondary | ICD-10-CM | POA: Diagnosis present

## 2012-02-02 DIAGNOSIS — Z720 Tobacco use: Secondary | ICD-10-CM

## 2012-02-02 DIAGNOSIS — Z79899 Other long term (current) drug therapy: Secondary | ICD-10-CM

## 2012-02-02 DIAGNOSIS — F411 Generalized anxiety disorder: Secondary | ICD-10-CM

## 2012-02-02 DIAGNOSIS — K921 Melena: Secondary | ICD-10-CM

## 2012-02-02 DIAGNOSIS — H6690 Otitis media, unspecified, unspecified ear: Secondary | ICD-10-CM | POA: Diagnosis present

## 2012-02-02 DIAGNOSIS — R197 Diarrhea, unspecified: Secondary | ICD-10-CM

## 2012-02-02 DIAGNOSIS — R112 Nausea with vomiting, unspecified: Secondary | ICD-10-CM | POA: Diagnosis present

## 2012-02-02 DIAGNOSIS — F319 Bipolar disorder, unspecified: Secondary | ICD-10-CM | POA: Diagnosis present

## 2012-02-02 HISTORY — DX: Bipolar disorder, unspecified: F31.9

## 2012-02-02 HISTORY — DX: Hyperlipidemia, unspecified: E78.5

## 2012-02-02 LAB — COMPREHENSIVE METABOLIC PANEL
ALT: 31 U/L (ref 0–35)
Albumin: 3.8 g/dL (ref 3.5–5.2)
Alkaline Phosphatase: 86 U/L (ref 39–117)
BUN: 16 mg/dL (ref 6–23)
CO2: 25 mEq/L (ref 19–32)
Chloride: 106 mEq/L (ref 96–112)
Creatinine, Ser: 0.67 mg/dL (ref 0.50–1.10)
GFR calc non Af Amer: >90 mL/min (ref >90)
Potassium: 3.4 mEq/L — ABNORMAL LOW (ref 3.5–5.1)
Sodium: 141 mEq/L (ref 135–145)
Total Bilirubin: 0.1 mg/dL — ABNORMAL LOW (ref 0.3–1.2)

## 2012-02-02 LAB — CBC WITH DIFFERENTIAL/PLATELET
Basophils Relative: 0 % (ref 0–1)
HCT: 38.3 % (ref 36.0–46.0)
Hemoglobin: 12.8 g/dL (ref 12.0–15.0)
Lymphocytes Relative: 19 % (ref 12–46)
Lymphs Abs: 1.9 10*3/uL (ref 0.7–4.0)
Monocytes Absolute: 0.6 10*3/uL (ref 0.1–1.0)
Monocytes Relative: 6 % (ref 3–12)
Neutro Abs: 7.3 10*3/uL (ref 1.7–7.7)
Neutrophils Relative %: 71 % (ref 43–77)
Platelets: 226 10*3/uL (ref 150–400)
RBC: 4.26 MIL/uL (ref 3.87–5.11)
WBC: 10.2 10*3/uL (ref 4.0–10.5)

## 2012-02-02 LAB — LIPASE, BLOOD: Lipase: 630 U/L — ABNORMAL HIGH (ref 11–59)

## 2012-02-02 LAB — URINALYSIS, ROUTINE W REFLEX MICROSCOPIC
Bilirubin Urine: NEGATIVE
Leukocytes, UA: NEGATIVE
Nitrite: NEGATIVE
Protein, ur: NEGATIVE mg/dL
Urobilinogen, UA: 0.2 mg/dL (ref 0.0–1.0)
pH: 6 (ref 5.0–8.0)

## 2012-02-02 MED ORDER — SODIUM CHLORIDE 0.9 % IV SOLN: INTRAVENOUS | Status: DC

## 2012-02-02 MED ORDER — ONDANSETRON HCL 4 MG/2ML IJ SOLN
4.0000 mg | Freq: Once | INTRAMUSCULAR | Status: AC
  Administered 2012-02-02: 4 mg via INTRAVENOUS
  Filled 2012-02-02: qty 2

## 2012-02-02 MED ORDER — PANTOPRAZOLE SODIUM 40 MG IV SOLR
40.0000 mg | INTRAVENOUS | Status: DC
  Administered 2012-02-02 – 2012-02-03 (×2): 40 mg via INTRAVENOUS
  Filled 2012-02-02 (×2): qty 40

## 2012-02-02 MED ORDER — ONDANSETRON HCL 4 MG/2ML IJ SOLN
4.0000 mg | Freq: Four times a day (QID) | INTRAMUSCULAR | Status: DC | PRN
  Administered 2012-02-03 – 2012-02-05 (×9): 4 mg via INTRAVENOUS
  Filled 2012-02-02 (×9): qty 2

## 2012-02-02 MED ORDER — NICOTINE 14 MG/24HR TD PT24
14.0000 mg | MEDICATED_PATCH | Freq: Every day | TRANSDERMAL | Status: DC
  Administered 2012-02-02 – 2012-02-06 (×5): 14 mg via TRANSDERMAL
  Filled 2012-02-02 (×5): qty 1

## 2012-02-02 MED ORDER — SODIUM CHLORIDE 0.9 % IV SOLN
INTRAVENOUS | Status: DC
  Administered 2012-02-02: 14:00:00 via INTRAVENOUS

## 2012-02-02 MED ORDER — HYDROMORPHONE HCL PF 1 MG/ML IJ SOLN
1.0000 mg | INTRAMUSCULAR | Status: DC | PRN
  Administered 2012-02-02 – 2012-02-03 (×5): 1 mg via INTRAVENOUS
  Filled 2012-02-02 (×5): qty 1

## 2012-02-02 MED ORDER — POTASSIUM CHLORIDE IN NACL 40-0.9 MEQ/L-% IV SOLN
INTRAVENOUS | Status: DC
  Administered 2012-02-02 – 2012-02-05 (×5): via INTRAVENOUS
  Filled 2012-02-02 (×13): qty 1000

## 2012-02-02 MED ORDER — MORPHINE SULFATE 4 MG/ML IJ SOLN
4.0000 mg | Freq: Once | INTRAMUSCULAR | Status: AC
  Administered 2012-02-02: 4 mg via INTRAVENOUS
  Filled 2012-02-02: qty 1

## 2012-02-02 MED ORDER — HYDROMORPHONE HCL PF 1 MG/ML IJ SOLN
1.0000 mg | Freq: Once | INTRAMUSCULAR | Status: AC
  Administered 2012-02-02: 1 mg via INTRAVENOUS
  Filled 2012-02-02: qty 1

## 2012-02-02 MED ORDER — LORAZEPAM 2 MG/ML IJ SOLN
0.5000 mg | INTRAMUSCULAR | Status: DC | PRN
  Administered 2012-02-02 – 2012-02-04 (×6): 0.5 mg via INTRAVENOUS
  Filled 2012-02-02 (×6): qty 1

## 2012-02-02 MED ORDER — ALBUTEROL SULFATE (5 MG/ML) 0.5% IN NEBU
2.5000 mg | INHALATION_SOLUTION | Freq: Three times a day (TID) | RESPIRATORY_TRACT | Status: DC
  Administered 2012-02-03 – 2012-02-06 (×10): 2.5 mg via RESPIRATORY_TRACT
  Filled 2012-02-02 (×11): qty 0.5

## 2012-02-02 MED ORDER — POTASSIUM CHLORIDE IN NACL 40-0.9 MEQ/L-% IV SOLN
INTRAVENOUS | Status: AC
  Filled 2012-02-02: qty 1000

## 2012-02-02 MED ORDER — PROMETHAZINE HCL 12.5 MG PO TABS
12.5000 mg | ORAL_TABLET | ORAL | Status: DC | PRN
  Administered 2012-02-03 – 2012-02-05 (×5): 12.5 mg via ORAL
  Filled 2012-02-02 (×5): qty 1

## 2012-02-02 MED ORDER — ONDANSETRON HCL 4 MG PO TABS: 4.0000 mg | ORAL_TABLET | Freq: Four times a day (QID) | ORAL | Status: DC | PRN

## 2012-02-02 MED ORDER — ENOXAPARIN SODIUM 40 MG/0.4ML ~~LOC~~ SOLN
40.0000 mg | SUBCUTANEOUS | Status: DC
  Filled 2012-02-02: qty 0.4

## 2012-02-02 MED ORDER — SODIUM CHLORIDE 0.9 % IV BOLUS (SEPSIS)
1000.0000 mL | Freq: Once | INTRAVENOUS | Status: AC
  Administered 2012-02-02: 1000 mL via INTRAVENOUS

## 2012-02-02 NOTE — ED Notes (Signed)
Patient with c/o LUQ abdominal pain that radiates to back. Patient describes pain as stabbing and aching. History of pancreatitis, states this pain is the same as previous flare ups.

## 2012-02-02 NOTE — ED Notes (Signed)
Pt presents with upper abdominal pain x 24 hrs that radiates to back. Pt states vomited x 2 this morning," yellow like substance". Pt is tearful and continuously rubbing belly.  History of pancreatitis with exacerbation last month. Pt is alert and oriented x 4, skin warm dry and pink. Side rails up for safety.

## 2012-02-02 NOTE — ED Notes (Signed)
Pt up to bathroom with minimal assistance. Pt vomiting small amounts of yellow fluid.

## 2012-02-02 NOTE — ED Provider Notes (Signed)
History     CSN: [id number]  Arrival date & time 02/02/12  1024   First MD Initiated Contact with Patient 02/02/12 1122      Chief Complaint  Patient presents with  . Abdominal Pain    (Consider location/radiation/quality/duration/timing/severity/associated sxs/prior treatment) HPI....Marland Kitchenepigastric pain since this morning. Status post pancreatitis since 1991 secondary to complications from cholecystectomy. Nothing makes symptoms better or worse. Pain radiates to back. Pain is moderate and sharp  Past Medical History  Diagnosis Date  . Asthma   . Depression   . Migraine   . Pancreatitis chronic   . Bipolar 1 disorder     Past Surgical History  Procedure Date  . Cholecystectomy   . Tonsillectomy   . Abdominal hysterectomy     Family History  Problem Relation Age of Onset  . Asthma Other   . Coronary artery disease Neg Hx     History  Substance Use Topics  . Smoking status: Former Smoker -- 1.0 packs/day for 15 years    Types: Cigarettes  . Smokeless tobacco: Current User  . Alcohol Use: No    OB History    Grav Para Term Preterm Abortions TAB SAB Ect Mult Living                  Review of Systems  All other systems reviewed and are negative.    Allergies  Sulfa antibiotics; Penicillins; and Prednisone  Home Medications   Current Outpatient Rx  Name Route Sig Dispense Refill  . ALBUTEROL SULFATE (2.5 MG/3ML) 0.083% IN NEBU Nebulization Take 2.5 mg by nebulization every 6 (six) hours as needed. For shortness of breath     . AMLODIPINE BESYLATE 10 MG PO TABS Oral Take 10 mg by mouth daily.      Marland Kitchen CARBAMAZEPINE 200 MG PO TABS Oral Take 200 mg by mouth daily.     Marland Kitchen CLONAZEPAM 0.5 MG PO TABS Oral Take 0.5 mg by mouth 3 (three) times daily as needed.    Marland Kitchen DOCUSATE SODIUM 100 MG PO CAPS Oral Take 100 mg by mouth at bedtime.     Marland Kitchen HYDROCODONE-ACETAMINOPHEN 10-325 MG PO TABS Oral Take 1 tablet by mouth every 6 (six) hours as needed for pain. pain 30 tablet 0  .  OMEPRAZOLE 20 MG PO CPDR Oral Take 20 mg by mouth 3 (three) times daily.      . SERTRALINE HCL 25 MG PO TABS Oral Take 25 mg by mouth at bedtime.    . ALBUTEROL SULFATE HFA 108 (90 BASE) MCG/ACT IN AERS Inhalation Inhale 2 puffs into the lungs daily as needed. For shortness of breath    . PROMETHAZINE HCL 25 MG RE SUPP Rectal Place 1 suppository (25 mg total) rectally every 6 (six) hours as needed for nausea. 12 each 0  . PROMETHAZINE HCL 25 MG PO TABS Oral Take 0.5 tablets (12.5 mg total) by mouth every 6 (six) hours as needed for nausea. 10 tablet 0    BP 126/78  Pulse 85  Temp 98.3 F (36.8 C) (Oral)  Resp 19  Ht 5\' 2"  (1.575 m)  Wt 165 lb (74.844 kg)  BMI 30.18 kg/m2  SpO2 98%  Physical Exam  Nursing note and vitals reviewed. Constitutional: She is oriented to person, place, and time. She appears well-developed and well-nourished.  HENT:  Head: Normocephalic and atraumatic.  Eyes: Conjunctivae and EOM are normal. Pupils are equal, round, and reactive to light.  Neck: Normal range of motion. Neck supple.  Cardiovascular: Normal rate and regular rhythm.   Pulmonary/Chest: Effort normal and breath sounds normal.  Abdominal: Soft. Bowel sounds are normal.       Tender epigastrium  Musculoskeletal: Normal range of motion.  Neurological: She is alert and oriented to person, place, and time.  Skin: Skin is warm and dry.  Psychiatric: She has a normal mood and affect.    ED Course  Procedures (including critical care time)  Labs Reviewed  COMPREHENSIVE METABOLIC PANEL - Abnormal; Notable for the following:    Potassium 3.4 (*)     Glucose, Bld 136 (*)     Total Bilirubin 0.1 (*)     All other components within normal limits  LIPASE, BLOOD - Abnormal; Notable for the following:    Lipase 630 (*)     All other components within normal limits  CBC WITH DIFFERENTIAL   No results found.   1. Pancreatitis       MDM  Lipase is 6:30. Will hydrate, treat pain and nausea.  Admit.        Donnetta Hutching, MD 02/02/12 1351

## 2012-02-02 NOTE — H&P (Signed)
JAKEIRA SEEMAN MRN: [id number] DOB/AGE: May 16, 1971 41 y.o. Primary Care Physician:FUSCO,LAWRENCE J., MD Admit date: 02/02/2012 Chief Complaint: Abdominal pain. HPI: The patient is a 41 year old woman with a past medical history significant for idiopathic pancreatitis, bipolar disorder, and asthma, who presents to the emergency department today with a chief complaint of abdominal pain. Her abdominal pain started last night. It is located in the epigastrium. She rates the pain as in 9/10 intensity. It is gnawing and sharp. It radiates to her back. It is associated with several episodes of nausea and vomiting. She denies coffee grounds emesis. She has also had 3-4 loose stools daily over the past several days. She denies black tarry stools or bright red blood per. She has had generalized malaise. She has had achiness all over. She has had some subjective fever and chills. She has a chronic headache which appears to have worsened over the past 24 hours. She had dysuria late last week, but it resolved. She does drink alcohol, but only on occasion, several times a year. She has a history of cholecystectomy.  In the emergency department earlier, she was noted to be afebrile and hemodynamically stable. Her lab data are significant for a blood lipase of 630, potassium of 3.4, normal LFTs, and normal WBC. She is being admitted for further evaluation and management.  Past Medical History  Diagnosis Date  . Asthma   . Depression   . Migraine   . Pancreatitis chronic Idiopathic    Treated by Dr. Andrey Campanile at [facility] in the past with celiac blocks.  . Bipolar 1 disorder     Past Surgical History  Procedure Date  . Cholecystectomy   . Tonsillectomy   . Abdominal hysterectomy     Prior to Admission medications   Medication Sig Start Date End Date Taking? Authorizing Provider  albuterol (PROVENTIL) (2.5 MG/3ML) 0.083% nebulizer solution Take 2.5 mg by nebulization every 6 (six) hours as needed. For  shortness of breath    Yes Historical Provider, MD  amLODipine (NORVASC) 10 MG tablet Take 10 mg by mouth daily.     Yes Historical Provider, MD  carbamazepine (EPITOL) 200 MG tablet Take 200 mg by mouth daily.    Yes Historical Provider, MD  clonazePAM (KLONOPIN) 0.5 MG tablet Take 0.5 mg by mouth 3 (three) times daily as needed.   Yes Historical Provider, MD  docusate sodium (COLACE) 100 MG capsule Take 100 mg by mouth at bedtime.    Yes Historical Provider, MD  HYDROcodone-acetaminophen (NORCO) 10-325 MG per tablet Take 1 tablet by mouth every 6 (six) hours as needed for pain. pain 08/30/11  Yes Erick Blinks, MD  omeprazole (PRILOSEC) 20 MG capsule Take 20 mg by mouth 3 (three) times daily.     Yes Historical Provider, MD  sertraline (ZOLOFT) 25 MG tablet Take 25 mg by mouth at bedtime.   Yes Historical Provider, MD  albuterol (PROVENTIL HFA;VENTOLIN HFA) 108 (90 BASE) MCG/ACT inhaler Inhale 2 puffs into the lungs daily as needed. For shortness of breath    Historical Provider, MD  promethazine (PHENERGAN) 25 MG suppository Place 1 suppository (25 mg total) rectally every 6 (six) hours as needed for nausea. 12/20/11 12/27/11  Hurman Horn, MD  promethazine (PHENERGAN) 25 MG tablet Take 0.5 tablets (12.5 mg total) by mouth every 6 (six) hours as needed for nausea. 11/07/11 11/14/11  Nicoletta Dress. Colon Branch, MD    Allergies:  Allergies  Allergen Reactions  . Sulfa Antibiotics Shortness Of Breath  .  Penicillins     REACTION: Unknown reaction  . Prednisone     pancreatitis    Family History  Problem Relation Age of Onset  . Asthma Other   . Coronary artery disease Neg Hx    Family history: Her father is 76 years of age and he has PTSD. Her mother is 19 years of age and has hypertension.  Social History: She is married. She lives in [place]. She has 2 children. She is unemployed. In the past, she was employed as a Electrical engineer. She is stop smoking and start a smoking on several occasions. She is now  smoking a half a pack of cigarettes per day. She drinks alcohol occasionally. She denies illicit drug use.         ROS: As above in history present illness, otherwise negative.  PHYSICAL EXAM: Blood pressure 115/78, pulse 93, temperature 98.2 F (36.8 C), temperature source Oral, resp. rate 18, height 5\' 2"  (1.575 m), weight 74.844 kg (165 lb), SpO2 91.00%.  General: Overweight 41 year old Caucasian woman lying in bed, in some distress from abdominal pain. HEENT: Head is normocephalic, nontraumatic. Pupils are equal, round, reactive to light. Extraocular was are intact. Conjunctivae are clear. Sclerae are white. Oropharynx reveals dry mucous membranes. No posterior exudates or erythema. Neck: Supple, no adenopathy, no thyromegaly, no JVD. Lungs: Faint wheezes. Breathing nonlabored. Heart: S1, S2, with a soft systolic murmur. Abdomen: Obese, hypoactive bowel sounds, soft, moderately tender in the epigastrium. No rigidity, no distention, no guarding. No masses palpated. Extremities: Pedal pulses palpable bilaterally. No pretibial edema. No pedal edema. Neurologic: She is alert and oriented x3. Cranial nerves II through XII are intact. Psychiatric: She is tearful, she appears to be a little anxious. She is not tremulous. Her speech is clear.  Basic Metabolic Panel:  Basename 02/02/12 1158  NA 141  K 3.4*  CL 106  CO2 25  GLUCOSE 136*  BUN 16  CREATININE 0.67  CALCIUM 9.4  MG --  PHOS --   Liver Function Tests:  Basename 02/02/12 1158  AST 25  ALT 31  ALKPHOS 86  BILITOT 0.1*  PROT 6.8  ALBUMIN 3.8    Basename 02/02/12 1158  LIPASE 630*  AMYLASE --   No results found for this basename: AMMONIA:2 in the last 72 hours CBC:  Basename 02/02/12 1158  WBC 10.2  NEUTROABS 7.3  HGB 12.8  HCT 38.3  MCV 89.9  PLT 226   Cardiac Enzymes: No results found for this basename: CKTOTAL:3,CKMB:3,CKMBINDEX:3,TROPONINI:3 in the last 72 hours BNP: No results found for this  basename: PROBNP:3 in the last 72 hours D-Dimer: No results found for this basename: DDIMER:2 in the last 72 hours CBG: No results found for this basename: GLUCAP:6 in the last 72 hours Hemoglobin A1C: No results found for this basename: HGBA1C in the last 72 hours Fasting Lipid Panel: No results found for this basename: CHOL,HDL,LDLCALC,TRIG,CHOLHDL,LDLDIRECT in the last 72 hours Thyroid Function Tests: No results found for this basename: TSH,T4TOTAL,FREET4,T3FREE,THYROIDAB in the last 72 hours Anemia Panel: No results found for this basename: VITAMINB12,FOLATE,FERRITIN,TIBC,IRON,RETICCTPCT in the last 72 hours Coagulation: No results found for this basename: LABPROT:2,INR:2 in the last 72 hours Urine Drug Screen: Drugs of Abuse     Component Value Date/Time   LABOPIA POSITIVE* 08/29/2011 1202   COCAINSCRNUR NONE DETECTED 08/29/2011 1202   LABBENZ POSITIVE* 08/29/2011 1202   AMPHETMU NONE DETECTED 08/29/2011 1202   THCU POSITIVE* 08/29/2011 1202   LABBARB NONE DETECTED 08/29/2011 1202  Alcohol Level: No results found for this basename: ETH:2 in the last 72 hours Urinalysis: No results found for this basename: COLORURINE:2,APPERANCEUR:2,LABSPEC:2,PHURINE:2,GLUCOSEU:2,HGBUR:2,BILIRUBINUR:2,KETONESUR:2,PROTEINUR:2,UROBILINOGEN:2,NITRITE:2,LEUKOCYTESUR:2 in the last 72 hours Misc. Labs:   No results found for this or any previous visit (from the past 240 hour(s)).   Results for orders placed during the hospital encounter of 02/02/12 (from the past 48 hour(s))  CBC WITH DIFFERENTIAL     Status: Normal   Collection Time   02/02/12 11:58 AM      Component Value Range Comment   WBC 10.2  4.0 - 10.5 K/uL    RBC 4.26  3.87 - 5.11 MIL/uL    Hemoglobin 12.8  12.0 - 15.0 g/dL    HCT 19.1  47.8 - 29.5 %    MCV 89.9  78.0 - 100.0 fL    MCH 30.0  26.0 - 34.0 pg    MCHC 33.4  30.0 - 36.0 g/dL    RDW 62.1  30.8 - 65.7 %    Platelets 226  150 - 400 K/uL    Neutrophils Relative 71  43 - 77 %     Neutro Abs 7.3  1.7 - 7.7 K/uL    Lymphocytes Relative 19  12 - 46 %    Lymphs Abs 1.9  0.7 - 4.0 K/uL    Monocytes Relative 6  3 - 12 %    Monocytes Absolute 0.6  0.1 - 1.0 K/uL    Eosinophils Relative 4  0 - 5 %    Eosinophils Absolute 0.4  0.0 - 0.7 K/uL    Basophils Relative 0  0 - 1 %    Basophils Absolute 0.0  0.0 - 0.1 K/uL   COMPREHENSIVE METABOLIC PANEL     Status: Abnormal   Collection Time   02/02/12 11:58 AM      Component Value Range Comment   Sodium 141  135 - 145 mEq/L    Potassium 3.4 (*) 3.5 - 5.1 mEq/L    Chloride 106  96 - 112 mEq/L    CO2 25  19 - 32 mEq/L    Glucose, Bld 136 (*) 70 - 99 mg/dL    BUN 16  6 - 23 mg/dL    Creatinine, Ser 8.46  0.50 - 1.10 mg/dL    Calcium 9.4  8.4 - 96.2 mg/dL    Total Protein 6.8  6.0 - 8.3 g/dL    Albumin 3.8  3.5 - 5.2 g/dL    AST 25  0 - 37 U/L    ALT 31  0 - 35 U/L    Alkaline Phosphatase 86  39 - 117 U/L    Total Bilirubin 0.1 (*) 0.3 - 1.2 mg/dL    GFR calc non Af Amer >90  >90 mL/min    GFR calc Af Amer >90  >90 mL/min   LIPASE, BLOOD     Status: Abnormal   Collection Time   02/02/12 11:58 AM      Component Value Range Comment   Lipase 630 (*) 11 - 59 U/L     No results found.  Impression:  Active Problems:  Idiopathic acute pancreatitis  Bipolar disorder  ASTHMA  Nausea & vomiting  Hypokalemia  Tobacco abuse  MIGRAINE HEADACHE   This is a 41 year old woman with a known history of idiopathic pancreatitis and history of multiple hospitalizations for exacerbations. She had been followed by a specialist at [facility] in the past. In the past, she has undergone treatment with celiac blocks  to try to ameliorate her pain. She was unable to followup with her specialist because of financial constraints. Her lipase is over 600. Her liver transaminases are within normal limits. She is afebrile and hemodynamically stable. She does not appear to be toxic.  Plan: 1. IV fluid hydration and bowel rest. 2. As needed  analgesics and as needed antiemetics to be given intravenously. 3. Because she will be made n.p.o., will give benzodiazepine treatment intravenously with as needed Ativan. 4. We'll place a nicotine patch. The patient was advised to stop smoking. Will order official tobacco cessation counseling. 5. Will start IV Protonix empirically. 6. For further evaluation, we'll check a fasting lipid panel in the morning, magnesium level, and daily lipase levels. We'll also order urinalysis and urine culture.      Rossetta Kama 02/02/2012, 7:10 PM

## 2012-02-02 NOTE — ED Notes (Signed)
Pt requested meds for pain, md notified

## 2012-02-03 ENCOUNTER — Inpatient Hospital Stay (HOSPITAL_COMMUNITY): Payer: Self-pay

## 2012-02-03 ENCOUNTER — Encounter (HOSPITAL_COMMUNITY): Payer: Self-pay

## 2012-02-03 ENCOUNTER — Encounter (HOSPITAL_COMMUNITY): Payer: Self-pay | Admitting: Internal Medicine

## 2012-02-03 DIAGNOSIS — E785 Hyperlipidemia, unspecified: Secondary | ICD-10-CM | POA: Diagnosis present

## 2012-02-03 HISTORY — DX: Hyperlipidemia, unspecified: E78.5

## 2012-02-03 LAB — MAGNESIUM: Magnesium: 1.9 mg/dL (ref 1.5–2.5)

## 2012-02-03 LAB — LIPID PANEL
HDL: 44 mg/dL (ref >39)
LDL Cholesterol: 124 mg/dL — ABNORMAL HIGH (ref 0–99)
Triglycerides: 174 mg/dL — ABNORMAL HIGH (ref <150)
VLDL: 35 mg/dL (ref 0–40)

## 2012-02-03 LAB — CBC
MCHC: 32.9 g/dL (ref 30.0–36.0)
RDW: 11.8 % (ref 11.5–15.5)

## 2012-02-03 LAB — COMPREHENSIVE METABOLIC PANEL
ALT: 29 U/L (ref 0–35)
Alkaline Phosphatase: 89 U/L (ref 39–117)
BUN: 9 mg/dL (ref 6–23)
CO2: 28 mEq/L (ref 19–32)
Calcium: 9.2 mg/dL (ref 8.4–10.5)
Chloride: 103 mEq/L (ref 96–112)
GFR calc Af Amer: >90 mL/min (ref >90)
GFR calc non Af Amer: >90 mL/min (ref >90)
Glucose, Bld: 95 mg/dL (ref 70–99)
Potassium: 3.6 mEq/L (ref 3.5–5.1)
Sodium: 139 mEq/L (ref 135–145)

## 2012-02-03 MED ORDER — ALBUTEROL SULFATE (5 MG/ML) 0.5% IN NEBU
2.5000 mg | INHALATION_SOLUTION | RESPIRATORY_TRACT | Status: DC | PRN
  Administered 2012-02-05 (×2): 2.5 mg via RESPIRATORY_TRACT
  Filled 2012-02-03 (×2): qty 0.5

## 2012-02-03 MED ORDER — SODIUM CHLORIDE 0.9 % IJ SOLN
10.0000 mL | INTRAMUSCULAR | Status: DC | PRN
  Administered 2012-02-05 (×2): 10 mL
  Filled 2012-02-03: qty 3

## 2012-02-03 MED ORDER — SODIUM CHLORIDE 0.9 % IJ SOLN
10.0000 mL | Freq: Two times a day (BID) | INTRAMUSCULAR | Status: DC
  Administered 2012-02-03 – 2012-02-05 (×3): 10 mL
  Filled 2012-02-03 (×4): qty 3
  Filled 2012-02-03: qty 6

## 2012-02-03 MED ORDER — HYDROMORPHONE HCL PF 1 MG/ML IJ SOLN
1.0000 mg | INTRAMUSCULAR | Status: DC | PRN
  Administered 2012-02-03 – 2012-02-06 (×22): 1 mg via INTRAVENOUS
  Filled 2012-02-03 (×23): qty 1

## 2012-02-03 MED ORDER — HEPARIN SODIUM (PORCINE) 5000 UNIT/ML IJ SOLN
5000.0000 [IU] | Freq: Three times a day (TID) | INTRAMUSCULAR | Status: DC
  Administered 2012-02-03 – 2012-02-06 (×8): 5000 [IU] via SUBCUTANEOUS
  Filled 2012-02-03 (×8): qty 1

## 2012-02-03 NOTE — Progress Notes (Signed)
Subjective: "I'm hurting." The patient complains of epigastric abdominal pain and chronic headache, globally. Pain relief from Dilaudid as lasting 2 hours.   Objective: Vital signs in last 24 hours: Filed Vitals:   02/02/12 1826 02/03/12 0500 02/03/12 0825 02/03/12 1338  BP: 115/78 107/75  110/74  Pulse: 93 90  87  Temp: 98.2 F (36.8 C) 97.6 F (36.4 C)  98.3 F (36.8 C)  TempSrc: Oral Oral    Resp: 18 20  20   Height: 5\' 2"  (1.575 m)     Weight: 74.844 kg (165 lb)     SpO2: 91% 91% 93% 94%    Intake/Output Summary (Last 24 hours) at 02/03/12 1411 Last data filed at 02/02/12 1837  Gross per 24 hour  Intake 629.17 ml  Output      0 ml  Net 629.17 ml    Weight change:   Physical exam: General: 41 year old overweight Caucasian woman, lying in bed, in some distress from abdominal pain. Lungs: Occasional wheezes, less than yesterday. Breathing nonlabored. Heart: S1, S2, with a soft systolic murmur. Abdomen: Hypoactive bowel sounds, obese, soft, moderately tender in the epigastrium, no rigidity, no distention, no masses palpated. Extremities: Pedal pulses palpable. No pretibial edema and no pedal edema. Neurologic/psychiatric: She has a tearful affect. She is slightly anxious. Her speech is clear. She is alert and oriented x3. Cranial nerves II through XII are intact.  Lab Results: Basic Metabolic Panel:  Basename 02/03/12 0615 02/02/12 1158  NA 139 141  K 3.6 3.4*  CL 103 106  CO2 28 25  GLUCOSE 95 136*  BUN 9 16  CREATININE 0.64 0.67  CALCIUM 9.2 9.4  MG 1.9 --  PHOS -- --   Liver Function Tests:  Basename 02/03/12 0615 02/02/12 1158  AST 22 25  ALT 29 31  ALKPHOS 89 86  BILITOT 0.3 0.1*  PROT 6.5 6.8  ALBUMIN 3.5 3.8    Basename 02/03/12 0615 02/02/12 1158  LIPASE 295* 630*  AMYLASE -- --   No results found for this basename: AMMONIA:2 in the last 72 hours CBC:  Basename 02/03/12 0615 02/02/12 1158  WBC 9.5 10.2  NEUTROABS -- 7.3  HGB 12.5 12.8    HCT 38.0 38.3  MCV 90.0 89.9  PLT 203 226   Cardiac Enzymes: No results found for this basename: CKTOTAL:3,CKMB:3,CKMBINDEX:3,TROPONINI:3 in the last 72 hours BNP: No results found for this basename: PROBNP:3 in the last 72 hours D-Dimer: No results found for this basename: DDIMER:2 in the last 72 hours CBG: No results found for this basename: GLUCAP:6 in the last 72 hours Hemoglobin A1C: No results found for this basename: HGBA1C in the last 72 hours Fasting Lipid Panel:  Basename 02/03/12 0617  CHOL 203*  HDL 44  LDLCALC 124*  TRIG 174*  CHOLHDL 4.6  LDLDIRECT --   Thyroid Function Tests: No results found for this basename: TSH,T4TOTAL,FREET4,T3FREE,THYROIDAB in the last 72 hours Anemia Panel: No results found for this basename: VITAMINB12,FOLATE,FERRITIN,TIBC,IRON,RETICCTPCT in the last 72 hours Coagulation: No results found for this basename: LABPROT:2,INR:2 in the last 72 hours Urine Drug Screen: Drugs of Abuse     Component Value Date/Time   LABOPIA POSITIVE* 08/29/2011 1202   COCAINSCRNUR NONE DETECTED 08/29/2011 1202   LABBENZ POSITIVE* 08/29/2011 1202   AMPHETMU NONE DETECTED 08/29/2011 1202   THCU POSITIVE* 08/29/2011 1202   LABBARB NONE DETECTED 08/29/2011 1202    Alcohol Level: No results found for this basename: ETH:2 in the last 72 hours Urinalysis:  [employer]  02/02/12 2308  COLORURINE AMBER*  LABSPEC 1.020  PHURINE 6.0  GLUCOSEU NEGATIVE  HGBUR NEGATIVE  BILIRUBINUR NEGATIVE  KETONESUR NEGATIVE  PROTEINUR NEGATIVE  UROBILINOGEN 0.2  NITRITE NEGATIVE  LEUKOCYTESUR NEGATIVE   Misc. Labs:   Micro: No results found for this or any previous visit (from the past 240 hour(s)).  Studies/Results: Dg Chest Port 1 View  02/03/2012  *RADIOLOGY REPORT*  Clinical Data: Bedside PICC placement.  PORTABLE CHEST - 1 VIEW 02/03/2012 0934 hours:  Comparison: Two-view chest x-ray 11/06/2011, 10/11/2011, 06/18/2010.  Findings: Right arm PICC tip in the lower SVC.  Cardiomediastinal silhouette unremarkable, unchanged.  New linear atelectasis in the inferior left upper lobe and at the left base.  Minimal patchy airspace opacity in the perihilar right upper lobe.  Lungs otherwise clear.  IMPRESSION:  1.  Right arm PICC tip in the lower SVC. 2.  Possible bronchopneumonia involving the right upper lobe. 3.  Minimal linear atelectasis in the inferior left upper lobe and at the left lung base.  Original Report Authenticated By: Arnell Sieving, M.D.    Medications:  Scheduled:   . albuterol  2.5 mg Nebulization Q8H  . enoxaparin (LOVENOX) injection  40 mg Subcutaneous Q24H  .  HYDROmorphone (DILAUDID) injection  1 mg Intravenous Once  . nicotine  14 mg Transdermal Daily  . ondansetron  4 mg Intravenous Once  . pantoprazole (PROTONIX) IV  40 mg Intravenous Q24H  . sodium chloride  10-40 mL Intracatheter Q12H   Continuous:   . 0.9 % NaCl with KCl 40 mEq / L 100 mL/hr at 02/02/12 2024  . DISCONTD: sodium chloride 125 mL/hr at 02/02/12 1837  . DISCONTD: sodium chloride     ZOX:[id number], HYDROmorphone (DILAUDID) injection, LORazepam, ondansetron (ZOFRAN) IV, ondansetron, promethazine, sodium chloride, DISCONTD:  HYDROmorphone (DILAUDID) injection  Assessment: Active Problems:  Idiopathic acute pancreatitis  Bipolar disorder  ASTHMA  Nausea & vomiting  Hypokalemia  Tobacco abuse  MIGRAINE HEADACHE  Hyperlipidemia  The patient is still in pain. Dilaudid is helping to relieve her pain but is not lasting. She has hyperlipidemia but not with excessive triglycerides. She has known idiopathic pancreatitis. Her potassium has been repleted in the IV fluids. She has been encouraged to stop smoking. She is on IV Ativan until she is able to take her chronic psychiatric medications.  Plan: Will increase the frequency of Dilaudid to every 2 hours as needed. Otherwise, continue current management.   LOS: 1 day   [name] 02/03/2012, 2:11 PM

## 2012-02-03 NOTE — Progress Notes (Signed)
UR Chart Review Completed  

## 2012-02-04 DIAGNOSIS — H6690 Otitis media, unspecified, unspecified ear: Secondary | ICD-10-CM | POA: Clinically undetermined

## 2012-02-04 DIAGNOSIS — F319 Bipolar disorder, unspecified: Secondary | ICD-10-CM

## 2012-02-04 LAB — URINE CULTURE

## 2012-02-04 MED ORDER — SERTRALINE HCL 50 MG PO TABS
25.0000 mg | ORAL_TABLET | Freq: Every day | ORAL | Status: DC
  Administered 2012-02-04 – 2012-02-05 (×2): 25 mg via ORAL
  Filled 2012-02-04: qty 1
  Filled 2012-02-04: qty 2

## 2012-02-04 MED ORDER — DEXTROSE 5 % IV SOLN
2.0000 g | Freq: Once | INTRAVENOUS | Status: AC
  Administered 2012-02-04: 2 g via INTRAVENOUS
  Filled 2012-02-04: qty 2

## 2012-02-04 MED ORDER — SODIUM CHLORIDE 0.9 % IN NEBU
INHALATION_SOLUTION | RESPIRATORY_TRACT | Status: AC
  Administered 2012-02-04: 3 mL
  Filled 2012-02-04: qty 3

## 2012-02-04 MED ORDER — DOCUSATE SODIUM 100 MG PO CAPS
100.0000 mg | ORAL_CAPSULE | Freq: Every day | ORAL | Status: DC
  Administered 2012-02-04 – 2012-02-05 (×2): 100 mg via ORAL
  Filled 2012-02-04 (×2): qty 1

## 2012-02-04 MED ORDER — SENNA 8.6 MG PO TABS
2.0000 | ORAL_TABLET | Freq: Every day | ORAL | Status: DC
  Administered 2012-02-04: 17.2 mg via ORAL
  Filled 2012-02-04: qty 2

## 2012-02-04 MED ORDER — CLONAZEPAM 0.5 MG PO TABS
0.5000 mg | ORAL_TABLET | Freq: Three times a day (TID) | ORAL | Status: DC | PRN
  Administered 2012-02-04 – 2012-02-06 (×2): 0.5 mg via ORAL
  Filled 2012-02-04 (×2): qty 1

## 2012-02-04 MED ORDER — CARBAMAZEPINE 200 MG PO TABS
200.0000 mg | ORAL_TABLET | Freq: Every day | ORAL | Status: DC
Start: 2012-02-04 — End: 2012-02-06
  Administered 2012-02-04 – 2012-02-05 (×2): 200 mg via ORAL
  Filled 2012-02-04 (×2): qty 1

## 2012-02-04 NOTE — Progress Notes (Signed)
Chart reviewed.  Subjective: Complaining of left ear pain and migraine headache with photophobia, nausea, phonophobia. Reports that she was told yesterday that she has a left ear infection. Abdominal pain improved. Not completely gone however. Frustrated and tired of being sick all the time.  Objective: Vital signs in last 24 hours: Filed Vitals:   02/03/12 2108 02/04/12 0038 02/04/12 0513 02/04/12 0720  BP: 118/81  114/78   Pulse: 100  81   Temp: 98.6 F (37 C)  98.4 F (36.9 C)   TempSrc:   Oral   Resp: 20  18   Height:      Weight:      SpO2: 97% 96% 94% 93%   Weight change:   Intake/Output Summary (Last 24 hours) at 02/04/12 1414 Last data filed at 02/04/12 0329  Gross per 24 hour  Intake 3108.33 ml  Output      0 ml  Net 3108.33 ml   General: Uncomfortable in a darkened room. Cooperative. HEENT left tympanic membrane erythematous. No bulging or fluid. Lungs clear to auscultation bilaterally without wheeze rhonchi or rales Cardiovascular regular rate rhythm without murmurs gas rubs Abdomen soft nontender nondistended Extremities no clubbing cyanosis or edema Psychiatric: Tearful. Cooperative.  Lab Results: Basic Metabolic Panel:  Lab 02/03/12 1610 02/02/12 1158  NA 139 141  K 3.6 3.4*  CL 103 106  CO2 28 25  GLUCOSE 95 136*  BUN 9 16  CREATININE 0.64 0.67  CALCIUM 9.2 9.4  MG 1.9 --  PHOS -- --   Liver Function Tests:  Lab 02/03/12 0615 02/02/12 1158  AST 22 25  ALT 29 31  ALKPHOS 89 86  BILITOT 0.3 0.1*  PROT 6.5 6.8  ALBUMIN 3.5 3.8    Lab 02/04/12 0645 02/03/12 0615  LIPASE 93* 295*  AMYLASE -- --   No results found for this basename: AMMONIA:2 in the last 168 hours CBC:  Lab 02/03/12 0615 02/02/12 1158  WBC 9.5 10.2  NEUTROABS -- 7.3  HGB 12.5 12.8  HCT 38.0 38.3  MCV 90.0 89.9  PLT 203 226   Cardiac Enzymes: No results found for this basename: CKTOTAL:3,CKMB:3,CKMBINDEX:3,TROPONINI:3 in the last 168 hours BNP: No results found  for this basename: PROBNP:3 in the last 168 hours D-Dimer: No results found for this basename: DDIMER:2 in the last 168 hours CBG: No results found for this basename: GLUCAP:6 in the last 168 hours Hemoglobin A1C: No results found for this basename: HGBA1C in the last 168 hours Fasting Lipid Panel:  Lab 02/03/12 0617  CHOL 203*  HDL 44  LDLCALC 124*  TRIG 174*  CHOLHDL 4.6  LDLDIRECT --   Thyroid Function Tests: No results found for this basename: TSH,T4TOTAL,FREET4,T3FREE,THYROIDAB in the last 168 hours Coagulation: No results found for this basename: LABPROT:4,INR:4 in the last 168 hours Anemia Panel: No results found for this basename: VITAMINB12,FOLATE,FERRITIN,TIBC,IRON,RETICCTPCT in the last 168 hours Urine Drug Screen: Drugs of Abuse     Component Value Date/Time   LABOPIA POSITIVE* 08/29/2011 1202   COCAINSCRNUR NONE DETECTED 08/29/2011 1202   LABBENZ POSITIVE* 08/29/2011 1202   AMPHETMU NONE DETECTED 08/29/2011 1202   THCU POSITIVE* 08/29/2011 1202   LABBARB NONE DETECTED 08/29/2011 1202    Alcohol Level: No results found for this basename: ETH:2 in the last 168 hours Urinalysis:  Lab 02/02/12 2308  COLORURINE AMBER*  LABSPEC 1.020  PHURINE 6.0  GLUCOSEU NEGATIVE  HGBUR NEGATIVE  BILIRUBINUR NEGATIVE  KETONESUR NEGATIVE  PROTEINUR NEGATIVE  UROBILINOGEN 0.2  NITRITE  NEGATIVE  LEUKOCYTESUR NEGATIVE   Micro Results: Recent Results (from the past 240 hour(s))  URINE CULTURE     Status: Normal   Collection Time   02/02/12 11:08 PM      Component Value Range Status Comment   Specimen Description URINE, CLEAN CATCH   Final    Special Requests NONE   Final    Culture  Setup Time 02/02/2012 23:25   Final    Colony Count NO GROWTH   Final    Culture NO GROWTH   Final    Report Status 02/04/2012 FINAL   Final    Studies/Results: Dg Chest Port 1 View  02/03/2012  *RADIOLOGY REPORT*  Clinical Data: Bedside PICC placement.  PORTABLE CHEST - 1 VIEW 02/03/2012 0934  hours:  Comparison: Two-view chest x-ray 11/06/2011, 10/11/2011, 06/18/2010.  Findings: Right arm PICC tip in the lower SVC. Cardiomediastinal silhouette unremarkable, unchanged.  New linear atelectasis in the inferior left upper lobe and at the left base.  Minimal patchy airspace opacity in the perihilar right upper lobe.  Lungs otherwise clear.  IMPRESSION:  1.  Right arm PICC tip in the lower SVC. 2.  Possible bronchopneumonia involving the right upper lobe. 3.  Minimal linear atelectasis in the inferior left upper lobe and at the left lung base.  Original Report Authenticated By: Arnell Sieving, M.D.   Scheduled Meds:   . albuterol  2.5 mg Nebulization Q8H  . heparin subcutaneous  5,000 Units Subcutaneous Q8H  . nicotine  14 mg Transdermal Daily  . sodium chloride  10-40 mL Intracatheter Q12H  . sodium chloride      . DISCONTD: enoxaparin (LOVENOX) injection  40 mg Subcutaneous Q24H  . DISCONTD: pantoprazole (PROTONIX) IV  40 mg Intravenous Q24H   Continuous Infusions:   . 0.9 % NaCl with KCl 40 mEq / L 100 mL/hr at 02/04/12 0515   PRN Meds:.albuterol, HYDROmorphone (DILAUDID) injection, LORazepam, ondansetron (ZOFRAN) IV, ondansetron, promethazine, sodium chloride Assessment/Plan:  Principal Problem:  *Idiopathic acute pancreatitis Active Problems:  Acute otitis media  Bipolar disorder  MIGRAINE HEADACHE  ASTHMA  GERD  Hypokalemia  Tobacco abuse  Hyperlipidemia  Pancreatitis symptoms and lipase are improving. We'll start clear liquids. Will give a dose of ceftriaxone for the otitis media. Resume Zoloft and Klonopin. Increase activity as able.   LOS: 2 days   Ojas Coone L 02/04/2012, 2:14 PM

## 2012-02-05 DIAGNOSIS — G43909 Migraine, unspecified, not intractable, without status migrainosus: Secondary | ICD-10-CM

## 2012-02-05 LAB — GLUCOSE, CAPILLARY: Glucose-Capillary: 132 mg/dL — ABNORMAL HIGH (ref 70–99)

## 2012-02-05 LAB — LIPASE, BLOOD: Lipase: 61 U/L — ABNORMAL HIGH (ref 11–59)

## 2012-02-05 MED ORDER — SENNA 8.6 MG PO TABS: 2.0000 | ORAL_TABLET | Freq: Every day | ORAL | Status: DC | PRN

## 2012-02-05 MED ORDER — SODIUM CHLORIDE 0.9 % IJ SOLN
INTRAMUSCULAR | Status: AC
  Filled 2012-02-05: qty 6

## 2012-02-05 MED ORDER — SODIUM CHLORIDE 0.9 % IJ SOLN
INTRAMUSCULAR | Status: AC
  Filled 2012-02-05: qty 3

## 2012-02-05 NOTE — Progress Notes (Signed)
Subjective: Abdomen is "sore", butt feeling better. ear pain and headache better. Tolerating clears.  Objective: Vital signs in last 24 hours: Filed Vitals:   02/05/12 0429 02/05/12 0457 02/05/12 0836 02/05/12 1306  BP:  136/76    Pulse:  97    Temp:  98.6 F (37 C)    TempSrc:  Oral    Resp:  20    Height:      Weight:      SpO2: 89% 90% 93% 93%   Weight change:   Intake/Output Summary (Last 24 hours) at 02/05/12 1315 Last data filed at 02/05/12 1229  Gross per 24 hour  Intake   1625 ml  Output      0 ml  Net   1625 ml   General: Walking in the hallways. Brighter more talkative and smiling. Lungs clear to auscultation bilaterally without wheeze rhonchi or rales Cardiovascular regular rate rhythm without murmurs gas rubs Abdomen soft nontender nondistended Extremities no clubbing cyanosis or edema Psychiatric: Tearful. Cooperative.  Lab Results: Basic Metabolic Panel:  Lab 02/03/12 1610 02/02/12 1158  NA 139 141  K 3.6 3.4*  CL 103 106  CO2 28 25  GLUCOSE 95 136*  BUN 9 16  CREATININE 0.64 0.67  CALCIUM 9.2 9.4  MG 1.9 --  PHOS -- --   Liver Function Tests:  Lab 02/03/12 0615 02/02/12 1158  AST 22 25  ALT 29 31  ALKPHOS 89 86  BILITOT 0.3 0.1*  PROT 6.5 6.8  ALBUMIN 3.5 3.8    Lab 02/05/12 0555 02/04/12 0645  LIPASE 61* 93*  AMYLASE -- --   No results found for this basename: AMMONIA:2 in the last 168 hours CBC:  Lab 02/03/12 0615 02/02/12 1158  WBC 9.5 10.2  NEUTROABS -- 7.3  HGB 12.5 12.8  HCT 38.0 38.3  MCV 90.0 89.9  PLT 203 226   Cardiac Enzymes: No results found for this basename: CKTOTAL:3,CKMB:3,CKMBINDEX:3,TROPONINI:3 in the last 168 hours BNP: No results found for this basename: PROBNP:3 in the last 168 hours D-Dimer: No results found for this basename: DDIMER:2 in the last 168 hours CBG:  Lab 02/05/12 0941  GLUCAP 132*   Hemoglobin A1C: No results found for this basename: HGBA1C in the last 168 hours Fasting Lipid  Panel:  Lab 02/03/12 0617  CHOL 203*  HDL 44  LDLCALC 124*  TRIG 174*  CHOLHDL 4.6  LDLDIRECT --   Thyroid Function Tests: No results found for this basename: TSH,T4TOTAL,FREET4,T3FREE,THYROIDAB in the last 168 hours Coagulation: No results found for this basename: LABPROT:4,INR:4 in the last 168 hours Anemia Panel: No results found for this basename: VITAMINB12,FOLATE,FERRITIN,TIBC,IRON,RETICCTPCT in the last 168 hours Urine Drug Screen: Drugs of Abuse     Component Value Date/Time   LABOPIA POSITIVE* 08/29/2011 1202   COCAINSCRNUR NONE DETECTED 08/29/2011 1202   LABBENZ POSITIVE* 08/29/2011 1202   AMPHETMU NONE DETECTED 08/29/2011 1202   THCU POSITIVE* 08/29/2011 1202   LABBARB NONE DETECTED 08/29/2011 1202    Alcohol Level: No results found for this basename: ETH:2 in the last 168 hours Urinalysis:  Lab 02/02/12 2308  COLORURINE AMBER*  LABSPEC 1.020  PHURINE 6.0  GLUCOSEU NEGATIVE  HGBUR NEGATIVE  BILIRUBINUR NEGATIVE  KETONESUR NEGATIVE  PROTEINUR NEGATIVE  UROBILINOGEN 0.2  NITRITE NEGATIVE  LEUKOCYTESUR NEGATIVE   Micro Results: Recent Results (from the past 240 hour(s))  URINE CULTURE     Status: Normal   Collection Time   02/02/12 11:08 PM  Component Value Range Status Comment   Specimen Description URINE, CLEAN CATCH   Final    Special Requests NONE   Final    Culture  Setup Time 02/02/2012 23:25   Final    Colony Count NO GROWTH   Final    Culture NO GROWTH   Final    Report Status 02/04/2012 FINAL   Final    Studies/Results: No results found. Scheduled Meds:    . albuterol  2.5 mg Nebulization Q8H  . carbamazepine  200 mg Oral Daily  . cefTRIAXone (ROCEPHIN)  IV  2 g Intravenous Once  . docusate sodium  100 mg Oral QHS  . heparin subcutaneous  5,000 Units Subcutaneous Q8H  . nicotine  14 mg Transdermal Daily  . sertraline  25 mg Oral QHS  . sodium chloride  10-40 mL Intracatheter Q12H  . DISCONTD: pantoprazole (PROTONIX) IV  40 mg Intravenous  Q24H  . DISCONTD: senna  2 tablet Oral Daily   Continuous Infusions:    . DISCONTD: 0.9 % NaCl with KCl 40 mEq / [name] 75 mL/hr at 02/05/12 0151   PRN Meds:.albuterol, clonazePAM, HYDROmorphone (DILAUDID) injection, ondansetron (ZOFRAN) IV, ondansetron, promethazine, senna, sodium chloride, DISCONTD: LORazepam Assessment/Plan:  Principal Problem:  *Idiopathic acute pancreatitis Active Problems:  Acute otitis media  Bipolar disorder  MIGRAINE HEADACHE  ASTHMA  GERD  Hypokalemia  Tobacco abuse  Hyperlipidemia  Improving. Will stop IV fluids, advance diet. Home tomorrow if stable.   LOS: 3 days   [name] 02/05/2012, 1:15 PM

## 2012-02-05 NOTE — Progress Notes (Signed)
Nutrition dx:  Nutrition-related knowledge deficit r/t Fat Modified diet therapy AEB patient request  Intervention:  Brief education;  Provided.  Goals of nutrition therapy discussed.  Understanding confirmed.  RD contact information provided.  Monitoring:  Knowledge; for questions.  Please consult RD if new questions present.  Pager: [phone]

## 2012-02-06 MED ORDER — SODIUM CHLORIDE 0.9 % IN NEBU
INHALATION_SOLUTION | RESPIRATORY_TRACT | Status: AC
  Filled 2012-02-06: qty 3

## 2012-02-06 MED ORDER — PROMETHAZINE HCL 12.5 MG PO TABS: 12.5000 mg | ORAL_TABLET | ORAL | Status: DC | PRN

## 2012-02-06 MED ORDER — HYDROCODONE-ACETAMINOPHEN 10-325 MG PO TABS: 1.0000 | ORAL_TABLET | Freq: Four times a day (QID) | ORAL | Status: DC | PRN

## 2012-02-06 MED ORDER — CEFDINIR 300 MG PO CAPS: 300.0000 mg | ORAL_CAPSULE | Freq: Two times a day (BID) | ORAL | Status: AC

## 2012-02-06 NOTE — Discharge Summary (Signed)
Physician Discharge Summary  Patient ID: [name] MRN: [id number] DOB/AGE: 1971-04-30 41 y.o.  Admit date: 02/02/2012 Discharge date: 02/06/2012  Discharge Diagnoses:  Principal Problem:  *Idiopathic acute pancreatitis, recurrent Active Problems:  Acute otitis media  Bipolar disorder  MIGRAINE HEADACHE  ASTHMA  GERD  Hypokalemia  Tobacco abuse  Hyperlipidemia   Medication List  As of 02/06/2012  9:26 AM   TAKE these medications         albuterol 108 (90 BASE) MCG/ACT inhaler   Commonly known as: PROVENTIL HFA;VENTOLIN HFA   Inhale 2 puffs into the lungs daily as needed. For shortness of breath      albuterol (2.5 MG/3ML) 0.083% nebulizer solution   Commonly known as: PROVENTIL   Take 2.5 mg by nebulization every 6 (six) hours as needed. For shortness of breath      amLODipine 10 MG tablet   Commonly known as: NORVASC   Take 10 mg by mouth daily.      cefdinir 300 MG capsule   Commonly known as: OMNICEF   Take 1 capsule (300 mg total) by mouth 2 (two) times daily.      clonazePAM 0.5 MG tablet   Commonly known as: KLONOPIN   Take 0.5 mg by mouth 3 (three) times daily as needed.      docusate sodium 100 MG capsule   Commonly known as: COLACE   Take 100 mg by mouth at bedtime.      EPITOL 200 MG tablet   Generic drug: carbamazepine   Take 200 mg by mouth daily.      HYDROcodone-acetaminophen 10-325 MG per tablet   Commonly known as: NORCO   Take 1 tablet by mouth every 6 (six) hours as needed for pain. pain      omeprazole 20 MG capsule   Commonly known as: PRILOSEC   Take 20 mg by mouth 3 (three) times daily.      promethazine 25 MG tablet   Commonly known as: PHENERGAN   Take 0.5 tablets (12.5 mg total) by mouth every 6 (six) hours as needed for nausea.      promethazine 25 MG suppository   Commonly known as: PHENERGAN   Place 1 suppository (25 mg total) rectally every 6 (six) hours as needed for nausea.      promethazine 12.5 MG tablet   Commonly known as: PHENERGAN   Take 1 tablet (12.5 mg total) by mouth every 4 (four) hours as needed.      sertraline 25 MG tablet   Commonly known as: ZOLOFT   Take 25 mg by mouth at bedtime.            Discharge Orders    Future Orders Please Complete By Expires   Activity as tolerated - No restrictions      Discharge instructions      Comments:   Low fat diet. No driving while on pain medicine      Follow-up Information    Follow up with Cassell Smiles., MD. (If symptoms worsen)    Contact information:   [address] [place] [address] [phone]          Disposition: 01-Home or Self Care  Discharged Condition: stable  Consults:  none  Diagnostics:  Dg Chest Port 1 View  02/03/2012  *RADIOLOGY REPORT*  Clinical Data: Bedside PICC placement.  PORTABLE CHEST - 1 VIEW 02/03/2012 0934 hours:  Comparison: Two-view chest x-ray 11/06/2011, 10/11/2011, 06/18/2010.  Findings: Right  arm PICC tip in the lower SVC. Cardiomediastinal silhouette unremarkable, unchanged.  New linear atelectasis in the inferior left upper lobe and at the left base.  Minimal patchy airspace opacity in the perihilar right upper lobe.  Lungs otherwise clear.  IMPRESSION:  1.  Right arm PICC tip in the lower SVC. 2.  Possible bronchopneumonia involving the right upper lobe. 3.  Minimal linear atelectasis in the inferior left upper lobe and at the left lung base.  Original Report Authenticated By: Arnell Sieving, M.D.   Labs  Sodium        139       Potassium        3.6       Chloride        103       CO2        28       BUN        9       Creatinine, Ser        0.64       Calcium        9.2       GFR calc non Af Amer        90 mL/min">90       GFR calc Af Amer        90 mL/min  The eGFR has been calculated using the CKD EPI equation. This calculation has not been validated in all clinical situations. eGFR's persistently <90 mL/min signify possible Chronic  Kidney Disease.">9090 mL/min  The eGFR has been calculated using the CKD EPI equation. This calculation has not been validated in all clinical situations. eGFR's persistently <90 mL/min signify possible Chronic Kidney Disease." border=0 src="[email or web address]:/[email or web address]/[email or web address]" width=5 height=10       Glucose, Bld        95       Magnesium        1.9       Alkaline Phosphatase        89       Albumin        3.5       Lipase        295 93 61     AST        22       ALT        29       Total Protein        6.5       Total Bilirubin        0.3        LIPID PROFILE    Cholesterol        203       Triglycerides        174       HDL        44       LDL Cholesterol        124        VLDL        35       Total CHOL/HDL Ratio        4.6        CBC    WBC        9.5       RBC        4.22       Hemoglobin        12.5       HCT  38.0       MCV        90.0       MCH        29.6       MCHC        32.9       RDW        11.8       Platelets        203        DIFFERENTIAL    Neutrophils Relative        71       Lymphocytes Relative        19       Monocytes Relative        6       Eosinophils Relative        4       Basophils Relative        0       Neutro Abs        7.3       Lymphs Abs        1.9       Monocytes Absolute        0.6       Eosinophils Absolute        0.4       Basophils Absolute        0.0        DIABETES    Glucose, Bld        95        URINALYSIS    Color, Urine        AMBER        APPearance        CLEAR       Specific Gravity, Urine        1.020       pH        6.0       Glucose, UA        NEGATIVE       Bilirubin Urine        NEGATIVE       Ketones, ur        NEGATIVE       Protein, ur        NEGATIVE       Urobilinogen, UA        0.2       Nitrite        NEGATIVE       Leukocytes, UA        NEGATIVE        Hgb urine dipstick        NEGATIVE       Full Code   Hospital Course: See H&P for  complete admission details. [name] is an is a 41 year old white female with a history of recurrent idiopathic pancreatitis. She is followed by a gastroenterologist at [facility] had celiac plexus blocks without much improvement. She does not drink alcohol. She presented with epigastric pain, nausea vomiting. She also had a headache and suffers from migraines. In the emergency room, she was noted to have normal vital signs. She had a soft abdomen that was tender in the epigastrium. She was tearful and anxious. Lipase was 630. She was admitted and started on IV fluids, pain medication, nausea medication. She also developed left ear pain and had signs of acute otitis media. She was given 2 mg of IV Rocephin. By the time of  discharge, her diet had been advanced to solids. Her pain and vomiting were improved. She was ambulating. Stable for discharge. Total time of day of discharge greater than 30 minutes.  Discharge Exam:  Blood pressure 116/91, pulse 90, temperature 97.4 F (36.3 C), temperature source Oral, resp. rate 24, height 5\' 2"  (1.575 m), weight 74.844 kg (165 lb), SpO2 91.00%.  Left ear slightly is less erythematous. Otherwise, physical exam unchanged from 02/05/2012  Signed: Crista Curb L 02/06/2012, 9:26 AM

## 2012-02-06 NOTE — Progress Notes (Signed)
Patient being discharged to home today in stable condition. Reviewed discharge instructions and medications with patient. Patient voiced understanding to instruction.

## 2012-03-07 ENCOUNTER — Encounter (HOSPITAL_COMMUNITY): Payer: Self-pay | Admitting: *Deleted

## 2012-03-07 ENCOUNTER — Emergency Department (HOSPITAL_COMMUNITY): Payer: Self-pay

## 2012-03-07 ENCOUNTER — Emergency Department (HOSPITAL_COMMUNITY)
Admission: EM | Admit: 2012-03-07 | Discharge: 2012-03-08 | Disposition: A | Discharge status: Home / Self Care | Payer: Self-pay | Attending: Emergency Medicine | Admitting: Emergency Medicine

## 2012-03-07 DIAGNOSIS — Z87891 Personal history of nicotine dependence: Secondary | ICD-10-CM | POA: Insufficient documentation

## 2012-03-07 DIAGNOSIS — E119 Type 2 diabetes mellitus without complications: Secondary | ICD-10-CM | POA: Insufficient documentation

## 2012-03-07 DIAGNOSIS — J449 Chronic obstructive pulmonary disease, unspecified: Secondary | ICD-10-CM | POA: Insufficient documentation

## 2012-03-07 DIAGNOSIS — J4489 Other specified chronic obstructive pulmonary disease: Secondary | ICD-10-CM | POA: Insufficient documentation

## 2012-03-07 DIAGNOSIS — Z79899 Other long term (current) drug therapy: Secondary | ICD-10-CM | POA: Insufficient documentation

## 2012-03-07 DIAGNOSIS — E785 Hyperlipidemia, unspecified: Secondary | ICD-10-CM | POA: Insufficient documentation

## 2012-03-07 DIAGNOSIS — F319 Bipolar disorder, unspecified: Secondary | ICD-10-CM | POA: Insufficient documentation

## 2012-03-07 DIAGNOSIS — K861 Other chronic pancreatitis: Secondary | ICD-10-CM | POA: Insufficient documentation

## 2012-03-07 DIAGNOSIS — J40 Bronchitis, not specified as acute or chronic: Secondary | ICD-10-CM

## 2012-03-07 MED ORDER — SODIUM CHLORIDE 0.9 % IV BOLUS (SEPSIS)
250.0000 mL | Freq: Once | INTRAVENOUS | Status: AC
  Administered 2012-03-08: 250 mL via INTRAVENOUS

## 2012-03-07 MED ORDER — ALBUTEROL SULFATE (5 MG/ML) 0.5% IN NEBU
2.5000 mg | INHALATION_SOLUTION | Freq: Once | RESPIRATORY_TRACT | Status: AC
  Administered 2012-03-07: 2.5 mg via RESPIRATORY_TRACT
  Filled 2012-03-07: qty 0.5

## 2012-03-07 MED ORDER — IPRATROPIUM BROMIDE 0.02 % IN SOLN
0.5000 mg | Freq: Once | RESPIRATORY_TRACT | Status: AC
  Administered 2012-03-07: 0.5 mg via RESPIRATORY_TRACT
  Filled 2012-03-07: qty 2.5

## 2012-03-07 MED ORDER — SODIUM CHLORIDE 0.9 % IV SOLN
INTRAVENOUS | Status: DC
  Administered 2012-03-08: 01:00:00 via INTRAVENOUS

## 2012-03-07 NOTE — ED Notes (Signed)
Asthma flare since Saturday, Recent dx with diabetes  . Feels weak.Nausea. Has been using inhaler and neb tx.

## 2012-03-07 NOTE — ED Provider Notes (Signed)
History  This chart was scribed for Shelda Jakes, MD by Erskine Emery. This patient was seen in room APA09/APA09 and the patient's care was started at 22:25.   CSN: [id number]  Arrival date & time 03/07/12  2105   First MD Initiated Contact with Patient 03/07/12 2225      Chief Complaint  Patient presents with  . Asthma    (Consider location/radiation/quality/duration/timing/severity/associated sxs/prior treatment) HPI [name] is a 41 y.o. female who presents to the Emergency Department complaining of an asthma flare with associated wheezing and cough (that bring up a taste of blood) since Thursday (5 days ago). Pt also reports some right lower back pain, nausea, generalized weakness, chest pain, emesis (intermittent for the last week), and abdominal pain but denies any fevers or dysuria. Pt has been using an albuterol nebulizer often with only mild relief from symptoms. Pt has a h/o pancreatitis and reports she normally takes prednisone for her asthma but it aggravates her pancreas. Pt also has a h/o COPD and was just diagnosed with DM on Friday (3 days ago) for which she begins treatment tomorrow.   Dr. Sherwood Gambler is the pt's PCP.   Past Medical History  Diagnosis Date  . Asthma   . Depression   . Migraine   . Pancreatitis chronic Idiopathic    Treated by Dr. Andrey Campanile at [facility] in the past with celiac blocks.  . Bipolar 1 disorder   . Hyperlipidemia 02/03/2012  . Diabetes mellitus     Past Surgical History  Procedure Date  . Cholecystectomy   . Tonsillectomy   . Abdominal hysterectomy     Family History  Problem Relation Age of Onset  . Asthma Other   . Coronary artery disease Neg Hx     History  Substance Use Topics  . Smoking status: Former Smoker -- 1.0 packs/day for 15 years    Types: Cigarettes  . Smokeless tobacco: Current User  . Alcohol Use: No    OB History    Grav Para Term Preterm Abortions TAB SAB Ect Mult Living                   Review of Systems  Constitutional: Negative for fever and chills.  HENT: Negative for neck pain.   Respiratory: Positive for cough and wheezing.   Cardiovascular: Positive for chest pain.  Gastrointestinal: Positive for nausea, vomiting and abdominal pain.  Genitourinary: Negative for dysuria.  Musculoskeletal: Positive for back pain.  Neurological: Positive for weakness.  Psychiatric/Behavioral: Negative for hallucinations.  All other systems reviewed and are negative.    Allergies  Sulfa antibiotics; Penicillins; and Prednisone  Home Medications   Current Outpatient Rx  Name Route Sig Dispense Refill  . ALBUTEROL SULFATE HFA 108 (90 BASE) MCG/ACT IN AERS Inhalation Inhale 2 puffs into the lungs daily as needed. For shortness of breath    . ALBUTEROL SULFATE (2.5 MG/3ML) 0.083% IN NEBU Nebulization Take 2.5 mg by nebulization every 6 (six) hours as needed. For shortness of breath     . AMLODIPINE BESYLATE 10 MG PO TABS Oral Take 10 mg by mouth daily.      Marland Kitchen CLONAZEPAM 0.5 MG PO TABS Oral Take 0.5 mg by mouth 3 (three) times daily as needed.    Marland Kitchen DOCUSATE SODIUM 100 MG PO CAPS Oral Take 100 mg by mouth at bedtime.     . OMEPRAZOLE 20 MG PO CPDR Oral Take 20 mg by mouth 3 (three) times daily.      Marland Kitchen  OXYCODONE-ACETAMINOPHEN 5-325 MG PO TABS Oral Take 1 tablet by mouth every 6 (six) hours as needed. For pain    . PROMETHAZINE HCL 25 MG PO TABS Oral Take 25 mg by mouth every 6 (six) hours as needed.    . SERTRALINE HCL 25 MG PO TABS Oral Take 25 mg by mouth at bedtime.    . AZITHROMYCIN 250 MG PO TABS Oral Take 1 tablet (250 mg total) by mouth daily. Take first 2 tablets together, then 1 every day until finished. 6 tablet 0  . PREDNISONE 10 MG PO TABS Oral Take 4 tablets (40 mg total) by mouth daily. 20 tablet 0    Triage Vitals: BP 152/88  Pulse 121  Temp 97.7 F (36.5 C) (Oral)  Resp 24  Ht 5\' 2"  (1.575 m)  Wt 166 lb (75.297 kg)  BMI 30.36 kg/m2  SpO2 95%  Physical  Exam  Nursing note and vitals reviewed. Constitutional: She is oriented to person, place, and time. She appears well-developed and well-nourished. No distress.  HENT:  Head: Normocephalic and atraumatic.  Eyes: EOM are normal.  Neck: Neck supple. No tracheal deviation present.  Cardiovascular: Normal rate, regular rhythm and normal heart sounds.   No murmur heard. Pulmonary/Chest: Effort normal and breath sounds normal. No respiratory distress. She has no wheezes.  Abdominal: Soft. Bowel sounds are normal. She exhibits no distension. There is no tenderness.  Musculoskeletal: Normal range of motion. She exhibits no edema.       No swelling in legs  Neurological: She is alert and oriented to person, place, and time. No cranial nerve deficit. Coordination normal.  Skin: Skin is warm and dry.  Psychiatric: She has a normal mood and affect.    ED Course  Procedures (including critical care time) DIAGNOSTIC STUDIES: Oxygen Saturation is 97% on room air, adequate by my interpretation.    COORDINATION OF CARE: 23:04--I evaluated the patient and we discussed a treatment plan including chest x-ray, breathing treatment, labs, and nausea medication to which the pt agreed.    Labs Reviewed  GLUCOSE, CAPILLARY - Abnormal; Notable for the following:    Glucose-Capillary 165 (*)     All other components within normal limits  BASIC METABOLIC PANEL - Abnormal; Notable for the following:    Glucose, Bld 130 (*)     All other components within normal limits  CBC WITH DIFFERENTIAL - Abnormal; Notable for the following:    WBC 14.8 (*)     HCT 35.7 (*)     Neutro Abs 9.6 (*)     Eosinophils Relative 8 (*)     Eosinophils Absolute 1.2 (*)     All other components within normal limits  LIPASE, BLOOD   Dg Chest 2 View  03/07/2012  *RADIOLOGY REPORT*  Clinical Data: Shortness breath, cough.  CHEST - 2 VIEW  Comparison: 02/03/2012  Findings: Small hiatal hernia.  Old vertebral compression fracture  deformity near the thoracolumbar junction.  Vascular clips in the right upper abdomen.  Coarse infrahilar interstitial markings as before.  No confluent airspace infiltrate or overt edema.  No effusion.  Heart size normal.  IMPRESSION:  1.  Chronic changes as above.  No acute disease.  Original Report Authenticated By: Osa Craver, M.D.   Results for orders placed during the hospital encounter of 03/07/12  GLUCOSE, CAPILLARY      Component Value Range   Glucose-Capillary 165 (*) 70 - 99 mg/dL  BASIC METABOLIC PANEL  Component Value Range   Sodium 137  135 - 145 mEq/L   Potassium 3.5  3.5 - 5.1 mEq/L   Chloride 104  96 - 112 mEq/L   CO2 26  19 - 32 mEq/L   Glucose, Bld 130 (*) 70 - 99 mg/dL   BUN 13  6 - 23 mg/dL   Creatinine, Ser 9.60  0.50 - 1.10 mg/dL   Calcium 9.5  8.4 - 45.4 mg/dL   GFR calc non Af Amer >90  >90 mL/min   GFR calc Af Amer >90  >90 mL/min  CBC WITH DIFFERENTIAL      Component Value Range   WBC 14.8 (*) 4.0 - 10.5 K/uL   RBC 4.01  3.87 - 5.11 MIL/uL   Hemoglobin 12.3  12.0 - 15.0 g/dL   HCT 09.8 (*) 11.9 - 14.7 %   MCV 89.0  78.0 - 100.0 fL   MCH 30.7  26.0 - 34.0 pg   MCHC 34.5  30.0 - 36.0 g/dL   RDW 82.9  56.2 - 13.0 %   Platelets 216  150 - 400 K/uL   Neutrophils Relative 65  43 - 77 %   Neutro Abs 9.6 (*) 1.7 - 7.7 K/uL   Lymphocytes Relative 21  12 - 46 %   Lymphs Abs 3.1  0.7 - 4.0 K/uL   Monocytes Relative 6  3 - 12 %   Monocytes Absolute 0.9  0.1 - 1.0 K/uL   Eosinophils Relative 8 (*) 0 - 5 %   Eosinophils Absolute 1.2 (*) 0.0 - 0.7 K/uL   Basophils Relative 0  0 - 1 %   Basophils Absolute 0.0  0.0 - 0.1 K/uL  LIPASE, BLOOD      Component Value Range   Lipase 46  11 - 59 U/L     1. Bronchitis   2. COPD (chronic obstructive pulmonary disease)       MDM  No wheezing noted in the emergency part of this chart he had 2 nebulizers by the time I saw her patient stated she had wheezing treated with the side and Medrol in the  emergency department all wheezing resolved chest x-ray without is evidence of pneumonia. Symptoms are consistent with a bronchitis that of a productive cough we'll treat with a Z-Pak prednisone and have her continue her nebulizers at home patient is concerned about having pancreatitis in the past no evidence of that today and also was stated that she had a new diagnosis of diabetes however a blood sugar here today is normal and no evidence of metabolic acidosis.      I personally performed the services described in this documentation, which was scribed in my presence. The recorded information has been reviewed and considered.     Shelda Jakes, MD 03/08/12 0130

## 2012-03-08 LAB — BASIC METABOLIC PANEL
BUN: 13 mg/dL (ref 6–23)
CO2: 26 mEq/L (ref 19–32)
Calcium: 9.5 mg/dL (ref 8.4–10.5)
Creatinine, Ser: 0.64 mg/dL (ref 0.50–1.10)
GFR calc non Af Amer: >90 mL/min (ref >90)
Glucose, Bld: 130 mg/dL — ABNORMAL HIGH (ref 70–99)
Sodium: 137 mEq/L (ref 135–145)

## 2012-03-08 LAB — CBC WITH DIFFERENTIAL/PLATELET
Basophils Absolute: 0 10*3/uL (ref 0.0–0.1)
HCT: 35.7 % — ABNORMAL LOW (ref 36.0–46.0)
Lymphocytes Relative: 21 % (ref 12–46)
Lymphs Abs: 3.1 10*3/uL (ref 0.7–4.0)
MCV: 89 fL (ref 78.0–100.0)
Monocytes Absolute: 0.9 10*3/uL (ref 0.1–1.0)
Monocytes Relative: 6 % (ref 3–12)
Neutro Abs: 9.6 10*3/uL — ABNORMAL HIGH (ref 1.7–7.7)

## 2012-03-08 MED ORDER — AZITHROMYCIN 250 MG PO TABS: 250.0000 mg | ORAL_TABLET | Freq: Every day | ORAL | Status: AC

## 2012-03-08 MED ORDER — ONDANSETRON HCL 4 MG/2ML IJ SOLN
4.0000 mg | Freq: Once | INTRAMUSCULAR | Status: AC
  Administered 2012-03-08: 4 mg via INTRAVENOUS
  Filled 2012-03-08: qty 2

## 2012-03-08 MED ORDER — PREDNISONE 10 MG PO TABS: 40.0000 mg | ORAL_TABLET | Freq: Every day | ORAL | Status: DC

## 2012-03-08 MED ORDER — METHYLPREDNISOLONE SODIUM SUCC 125 MG IJ SOLR
125.0000 mg | Freq: Once | INTRAMUSCULAR | Status: AC
  Administered 2012-03-08: 125 mg via INTRAVENOUS
  Filled 2012-03-08: qty 2

## 2012-03-08 NOTE — ED Notes (Signed)
C/o nausea. MD aware.

## 2012-04-04 ENCOUNTER — Inpatient Hospital Stay (HOSPITAL_COMMUNITY)
Admission: EM | Admit: 2012-04-04 | Discharge: 2012-04-06 | DRG: 203 | Disposition: A | Discharge status: Home / Self Care | Payer: MEDICAID | Attending: Internal Medicine | Admitting: Internal Medicine

## 2012-04-04 ENCOUNTER — Encounter (HOSPITAL_COMMUNITY): Payer: Self-pay | Admitting: *Deleted

## 2012-04-04 ENCOUNTER — Emergency Department (HOSPITAL_COMMUNITY): Payer: Self-pay

## 2012-04-04 DIAGNOSIS — K8500 Idiopathic acute pancreatitis without necrosis or infection: Secondary | ICD-10-CM

## 2012-04-04 DIAGNOSIS — M549 Dorsalgia, unspecified: Secondary | ICD-10-CM | POA: Diagnosis present

## 2012-04-04 DIAGNOSIS — M62838 Other muscle spasm: Secondary | ICD-10-CM

## 2012-04-04 DIAGNOSIS — J45909 Unspecified asthma, uncomplicated: Secondary | ICD-10-CM

## 2012-04-04 DIAGNOSIS — H6090 Unspecified otitis externa, unspecified ear: Secondary | ICD-10-CM

## 2012-04-04 DIAGNOSIS — IMO0002 Reserved for concepts with insufficient information to code with codable children: Secondary | ICD-10-CM

## 2012-04-04 DIAGNOSIS — H6690 Otitis media, unspecified, unspecified ear: Secondary | ICD-10-CM

## 2012-04-04 DIAGNOSIS — Z880 Allergy status to penicillin: Secondary | ICD-10-CM

## 2012-04-04 DIAGNOSIS — Z6830 Body mass index (BMI) 30.0-30.9, adult: Secondary | ICD-10-CM

## 2012-04-04 DIAGNOSIS — G43909 Migraine, unspecified, not intractable, without status migrainosus: Secondary | ICD-10-CM

## 2012-04-04 DIAGNOSIS — E876 Hypokalemia: Secondary | ICD-10-CM

## 2012-04-04 DIAGNOSIS — F411 Generalized anxiety disorder: Secondary | ICD-10-CM

## 2012-04-04 DIAGNOSIS — Z79899 Other long term (current) drug therapy: Secondary | ICD-10-CM

## 2012-04-04 DIAGNOSIS — Z882 Allergy status to sulfonamides status: Secondary | ICD-10-CM

## 2012-04-04 DIAGNOSIS — J9601 Acute respiratory failure with hypoxia: Secondary | ICD-10-CM

## 2012-04-04 DIAGNOSIS — D509 Iron deficiency anemia, unspecified: Secondary | ICD-10-CM

## 2012-04-04 DIAGNOSIS — J45901 Unspecified asthma with (acute) exacerbation: Principal | ICD-10-CM

## 2012-04-04 DIAGNOSIS — J45902 Unspecified asthma with status asthmaticus: Secondary | ICD-10-CM

## 2012-04-04 DIAGNOSIS — R197 Diarrhea, unspecified: Secondary | ICD-10-CM

## 2012-04-04 DIAGNOSIS — K219 Gastro-esophageal reflux disease without esophagitis: Secondary | ICD-10-CM

## 2012-04-04 DIAGNOSIS — F319 Bipolar disorder, unspecified: Secondary | ICD-10-CM

## 2012-04-04 DIAGNOSIS — Z720 Tobacco use: Secondary | ICD-10-CM

## 2012-04-04 DIAGNOSIS — K449 Diaphragmatic hernia without obstruction or gangrene: Secondary | ICD-10-CM

## 2012-04-04 DIAGNOSIS — E669 Obesity, unspecified: Secondary | ICD-10-CM | POA: Diagnosis present

## 2012-04-04 DIAGNOSIS — G8929 Other chronic pain: Secondary | ICD-10-CM | POA: Diagnosis present

## 2012-04-04 DIAGNOSIS — K921 Melena: Secondary | ICD-10-CM

## 2012-04-04 DIAGNOSIS — Z87891 Personal history of nicotine dependence: Secondary | ICD-10-CM

## 2012-04-04 DIAGNOSIS — K222 Esophageal obstruction: Secondary | ICD-10-CM

## 2012-04-04 DIAGNOSIS — E119 Type 2 diabetes mellitus without complications: Secondary | ICD-10-CM | POA: Diagnosis present

## 2012-04-04 DIAGNOSIS — I10 Essential (primary) hypertension: Secondary | ICD-10-CM

## 2012-04-04 DIAGNOSIS — E785 Hyperlipidemia, unspecified: Secondary | ICD-10-CM

## 2012-04-04 DIAGNOSIS — R109 Unspecified abdominal pain: Secondary | ICD-10-CM

## 2012-04-04 LAB — POCT I-STAT, CHEM 8
Calcium, Ion: 1.21 mmol/L (ref 1.12–1.23)
Creatinine, Ser: 0.7 mg/dL (ref 0.50–1.10)
Glucose, Bld: 154 mg/dL — ABNORMAL HIGH (ref 70–99)
Hemoglobin: 13.3 g/dL (ref 12.0–15.0)
Potassium: 3.9 mEq/L (ref 3.5–5.1)
Sodium: 141 mEq/L (ref 135–145)
TCO2: 22 mmol/L (ref 0–100)

## 2012-04-04 MED ORDER — IPRATROPIUM BROMIDE 0.02 % IN SOLN
RESPIRATORY_TRACT | Status: AC
  Filled 2012-04-04: qty 2.5

## 2012-04-04 MED ORDER — IPRATROPIUM BROMIDE 0.02 % IN SOLN
0.5000 mg | Freq: Once | RESPIRATORY_TRACT | Status: AC
  Administered 2012-04-04: 0.5 mg via RESPIRATORY_TRACT

## 2012-04-04 MED ORDER — ALBUTEROL SULFATE (5 MG/ML) 0.5% IN NEBU
INHALATION_SOLUTION | RESPIRATORY_TRACT | Status: AC
  Filled 2012-04-04: qty 0.5

## 2012-04-04 MED ORDER — OXYCODONE-ACETAMINOPHEN 5-325 MG PO TABS
1.0000 | ORAL_TABLET | Freq: Once | ORAL | Status: AC
  Administered 2012-04-04: 1 via ORAL
  Filled 2012-04-04: qty 1

## 2012-04-04 MED ORDER — ALBUTEROL SULFATE (5 MG/ML) 0.5% IN NEBU
10.0000 mg | INHALATION_SOLUTION | Freq: Once | RESPIRATORY_TRACT | Status: AC
  Administered 2012-04-04: 10 mg via RESPIRATORY_TRACT
  Filled 2012-04-04: qty 2

## 2012-04-04 MED ORDER — METHYLPREDNISOLONE SODIUM SUCC 125 MG IJ SOLR
125.0000 mg | Freq: Once | INTRAMUSCULAR | Status: AC
  Administered 2012-04-04: 125 mg via INTRAVENOUS
  Filled 2012-04-04: qty 2

## 2012-04-04 MED ORDER — ALBUTEROL SULFATE (5 MG/ML) 0.5% IN NEBU
5.0000 mg | INHALATION_SOLUTION | Freq: Once | RESPIRATORY_TRACT | Status: AC
  Administered 2012-04-04: 5 mg via RESPIRATORY_TRACT
  Filled 2012-04-04: qty 1

## 2012-04-04 MED ORDER — MORPHINE SULFATE 4 MG/ML IJ SOLN
4.0000 mg | Freq: Once | INTRAMUSCULAR | Status: AC
  Administered 2012-04-04: 4 mg via INTRAVENOUS
  Filled 2012-04-04: qty 1

## 2012-04-04 MED ORDER — ALBUTEROL SULFATE (5 MG/ML) 0.5% IN NEBU
2.5000 mg | INHALATION_SOLUTION | Freq: Once | RESPIRATORY_TRACT | Status: AC
  Administered 2012-04-04: 2.5 mg via RESPIRATORY_TRACT

## 2012-04-04 MED ORDER — ALBUTEROL SULFATE (5 MG/ML) 0.5% IN NEBU
INHALATION_SOLUTION | RESPIRATORY_TRACT | Status: AC
  Filled 2012-04-04: qty 1

## 2012-04-04 NOTE — Progress Notes (Signed)
PATIENT C/O THE NEBULIZER TREATMENT GIVING HER A HEADACHE AND BACK HURTING RN NOTIFIED

## 2012-04-04 NOTE — ED Provider Notes (Signed)
History     CSN: [id number]  Arrival date & time 04/04/12  1857   First MD Initiated Contact with Patient 04/04/12 2016      Chief Complaint  Patient presents with  . Shortness of Breath     Patient is a 41 y.o. female presenting with shortness of breath. The history is provided by the patient.  Shortness of Breath  The current episode started 2 days ago. The onset was gradual. The problem occurs frequently. The problem has been gradually worsening. The problem is moderate. The symptoms are relieved by beta-agonist inhalers. Nothing aggravates the symptoms. Associated symptoms include cough, shortness of breath and wheezing. Pertinent negatives include no fever. She has had prior hospitalizations.  pt reports asthma exacerbation with increased cough and sputum production, wheezing and shortness of breath.  She reports chest wall soreness from coughing.  No syncope She quit smoking a month ago She feels similar to prior episodes of asthma   Past Medical History  Diagnosis Date  . Asthma   . Depression   . Migraine   . Pancreatitis chronic Idiopathic    Treated by Dr. Andrey Campanile at [facility] in the past with celiac blocks.  . Bipolar 1 disorder   . Hyperlipidemia 02/03/2012  . Diabetes mellitus     Past Surgical History  Procedure Date  . Cholecystectomy   . Tonsillectomy   . Abdominal hysterectomy     Family History  Problem Relation Age of Onset  . Asthma Other   . Coronary artery disease Neg Hx     History  Substance Use Topics  . Smoking status: Former Smoker -- 1.0 packs/day for 15 years    Types: Cigarettes  . Smokeless tobacco: Current User  . Alcohol Use: No    OB History    Grav Para Term Preterm Abortions TAB SAB Ect Mult Living                  Review of Systems  Constitutional: Negative for fever.  Respiratory: Positive for cough, shortness of breath and wheezing.   Musculoskeletal: Positive for myalgias.  All other systems reviewed and are  negative.    Allergies  Sulfa antibiotics; Penicillins; and Prednisone  Home Medications   Current Outpatient Rx  Name Route Sig Dispense Refill  . ALBUTEROL SULFATE HFA 108 (90 BASE) MCG/ACT IN AERS Inhalation Inhale 2 puffs into the lungs daily as needed. For shortness of breath    . ALBUTEROL SULFATE (2.5 MG/3ML) 0.083% IN NEBU Nebulization Take 2.5 mg by nebulization every 6 (six) hours as needed. For shortness of breath     . AMLODIPINE BESYLATE 10 MG PO TABS Oral Take 10 mg by mouth daily.      Marland Kitchen CLONAZEPAM 0.5 MG PO TABS Oral Take 0.5 mg by mouth 3 (three) times daily as needed.    Marland Kitchen DOCUSATE SODIUM 100 MG PO CAPS Oral Take 100 mg by mouth at bedtime.     . OMEPRAZOLE 20 MG PO CPDR Oral Take 20 mg by mouth 3 (three) times daily.      . OXYCODONE-ACETAMINOPHEN 5-325 MG PO TABS Oral Take 1 tablet by mouth every 6 (six) hours as needed. For pain    . PREDNISONE 10 MG PO TABS Oral Take 4 tablets (40 mg total) by mouth daily. 20 tablet 0  . PROMETHAZINE HCL 25 MG PO TABS Oral Take 25 mg by mouth every 6 (six) hours as needed.    . SERTRALINE HCL 25 MG  PO TABS Oral Take 25 mg by mouth at bedtime.      BP 127/67  Pulse 91  Temp 97.9 F (36.6 C) (Oral)  Resp 20  Ht 5\' 2"  (1.575 m)  Wt 170 lb (77.111 kg)  BMI 31.09 kg/m2  SpO2 96% BP 140/80  Pulse 94  Temp 97.9 F (36.6 C) (Oral)  Resp 26  Ht 5\' 2"  (1.575 m)  Wt 170 lb (77.111 kg)  BMI 31.09 kg/m2  SpO2 95%  Physical Exam CONSTITUTIONAL: Well developed/well nourished HEAD AND FACE: Normocephalic/atraumatic EYES: EOMI/PERRL ENMT: Mucous membranes moist NECK: supple no meningeal signs SPINE:entire spine nontender CV: S1/S2 noted, no murmurs/rubs/gallops noted LUNGS: coarse wheeze noted bilaterally with mild tachypnea noted Chest - posterior chest wall tenderness, no bruising or crepitance noted ABDOMEN: soft, nontender, no rebound or guarding GU:no cva tenderness NEURO: Pt is awake/alert, moves all  extremitiesx4 EXTREMITIES: pulses normal, full ROM SKIN: warm, color normal PSYCH: no abnormalities of mood noted  ED Course  Procedures  8:42 PM Pt with h/o asthma, now with exacerbation.  Will try hour long neb and reassess Can not tolerate prednisone, but can tolerate solumedrol  11:40 PM Pt given multiple rounds of albuterol When ambulating, she became hypoxic and tachypneic and she continues to wheeze Will admit for observation D/w dr Orvan Falconer to admit   MDM  Nursing notes including past medical history and social history reviewed and considered in documentation Previous records reviewed and considered xrays reviewed and considered Labs/vital reviewed and considered      Date: 04/04/2012  Rate: 87  Rhythm: normal sinus rhythm  QRS Axis: normal  Intervals: normal  ST/T Wave abnormalities: nonspecific ST changes  Conduction Disutrbances:right bundle branch block  Narrative Interpretation:   Old EKG Reviewed: unchanged      Joya Gaskins, MD 04/04/12 2341

## 2012-04-04 NOTE — ED Notes (Signed)
Patient states that she has a headache. MD notified.

## 2012-04-04 NOTE — ED Notes (Signed)
Patient ambulated to desk and back.  Patient has very audible wheezing and o2 sat dropped to 86%.  After allowing patient to rest for a couple of minutes her o2 sat improved to 93-95%.  Patient stated that walking made her feel weak and slightly dizzy.  Patient did not require assistance walking.

## 2012-04-04 NOTE — ED Notes (Signed)
Has felt sob for 2 days,sore throat.  Hx of asthma, dry cough.

## 2012-04-05 ENCOUNTER — Encounter (HOSPITAL_COMMUNITY): Payer: Self-pay | Admitting: *Deleted

## 2012-04-05 DIAGNOSIS — F411 Generalized anxiety disorder: Secondary | ICD-10-CM

## 2012-04-05 DIAGNOSIS — G43909 Migraine, unspecified, not intractable, without status migrainosus: Secondary | ICD-10-CM

## 2012-04-05 DIAGNOSIS — J45901 Unspecified asthma with (acute) exacerbation: Principal | ICD-10-CM

## 2012-04-05 DIAGNOSIS — F319 Bipolar disorder, unspecified: Secondary | ICD-10-CM

## 2012-04-05 LAB — URINALYSIS, ROUTINE W REFLEX MICROSCOPIC
Bilirubin Urine: NEGATIVE
Ketones, ur: NEGATIVE mg/dL
Nitrite: NEGATIVE
Protein, ur: NEGATIVE mg/dL
pH: 5.5 (ref 5.0–8.0)

## 2012-04-05 LAB — BASIC METABOLIC PANEL
BUN: 10 mg/dL (ref 6–23)
CO2: 22 mEq/L (ref 19–32)
Calcium: 9.2 mg/dL (ref 8.4–10.5)
Chloride: 100 mEq/L (ref 96–112)
Creatinine, Ser: 0.59 mg/dL (ref 0.50–1.10)
GFR calc Af Amer: >90 mL/min (ref >90)
GFR calc non Af Amer: >90 mL/min (ref >90)
Glucose, Bld: 264 mg/dL — ABNORMAL HIGH (ref 70–99)
Potassium: 3.7 mEq/L (ref 3.5–5.1)
Sodium: 134 mEq/L — ABNORMAL LOW (ref 135–145)

## 2012-04-05 LAB — CBC WITH DIFFERENTIAL/PLATELET
Basophils Absolute: 0 10*3/uL (ref 0.0–0.1)
Basophils Relative: 0 % (ref 0–1)
Eosinophils Absolute: 0.1 10*3/uL (ref 0.0–0.7)
Eosinophils Relative: 1 % (ref 0–5)
HCT: 40.5 % (ref 36.0–46.0)
Hemoglobin: 13.7 g/dL (ref 12.0–15.0)
MCH: 31.2 pg (ref 26.0–34.0)
MCHC: 33.8 g/dL (ref 30.0–36.0)
MCV: 92.3 fL (ref 78.0–100.0)
Monocytes Relative: 1 % — ABNORMAL LOW (ref 3–12)
Neutrophils Relative %: 92 % — ABNORMAL HIGH (ref 43–77)
Platelets: 208 10*3/uL (ref 150–400)
RBC: 4.39 MIL/uL (ref 3.87–5.11)
RDW: 13 % (ref 11.5–15.5)

## 2012-04-05 MED ORDER — CLONAZEPAM 0.5 MG PO TABS
0.5000 mg | ORAL_TABLET | Freq: Three times a day (TID) | ORAL | Status: DC | PRN
  Administered 2012-04-05 – 2012-04-06 (×2): 0.5 mg via ORAL
  Filled 2012-04-05 (×2): qty 1

## 2012-04-05 MED ORDER — LEVALBUTEROL HCL 1.25 MG/0.5ML IN NEBU
1.2500 mg | INHALATION_SOLUTION | Freq: Four times a day (QID) | RESPIRATORY_TRACT | Status: DC
  Administered 2012-04-05 – 2012-04-06 (×4): 1.25 mg via RESPIRATORY_TRACT
  Filled 2012-04-05 (×4): qty 0.5

## 2012-04-05 MED ORDER — DOCUSATE SODIUM 100 MG PO CAPS
100.0000 mg | ORAL_CAPSULE | Freq: Every day | ORAL | Status: DC
  Administered 2012-04-05 (×2): 100 mg via ORAL
  Filled 2012-04-05: qty 1

## 2012-04-05 MED ORDER — PANTOPRAZOLE SODIUM 40 MG PO TBEC
80.0000 mg | DELAYED_RELEASE_TABLET | Freq: Two times a day (BID) | ORAL | Status: DC
  Administered 2012-04-05: 80 mg via ORAL
  Filled 2012-04-05: qty 2

## 2012-04-05 MED ORDER — SODIUM CHLORIDE 0.9 % IJ SOLN
INTRAMUSCULAR | Status: AC
  Administered 2012-04-05: 10 mL
  Filled 2012-04-05: qty 3

## 2012-04-05 MED ORDER — ONDANSETRON HCL 4 MG PO TABS
4.0000 mg | ORAL_TABLET | Freq: Four times a day (QID) | ORAL | Status: DC | PRN
  Administered 2012-04-05: 4 mg via ORAL
  Filled 2012-04-05: qty 1

## 2012-04-05 MED ORDER — LEVALBUTEROL HCL 0.63 MG/3ML IN NEBU: 0.6300 mg | INHALATION_SOLUTION | RESPIRATORY_TRACT | Status: DC | PRN

## 2012-04-05 MED ORDER — PANTOPRAZOLE SODIUM 40 MG PO TBEC
40.0000 mg | DELAYED_RELEASE_TABLET | Freq: Two times a day (BID) | ORAL | Status: DC
  Administered 2012-04-05 – 2012-04-06 (×2): 40 mg via ORAL
  Filled 2012-04-05 (×2): qty 1

## 2012-04-05 MED ORDER — AMLODIPINE BESYLATE 5 MG PO TABS
10.0000 mg | ORAL_TABLET | Freq: Every day | ORAL | Status: DC
  Administered 2012-04-06: 10 mg via ORAL
  Filled 2012-04-05: qty 2

## 2012-04-05 MED ORDER — ACETAMINOPHEN 325 MG PO TABS
650.0000 mg | ORAL_TABLET | ORAL | Status: DC | PRN
  Administered 2012-04-05: 650 mg via ORAL
  Filled 2012-04-05: qty 2

## 2012-04-05 MED ORDER — METHYLPREDNISOLONE SODIUM SUCC 125 MG IJ SOLR: 60.0000 mg | Freq: Four times a day (QID) | INTRAMUSCULAR | Status: DC

## 2012-04-05 MED ORDER — ATORVASTATIN CALCIUM 20 MG PO TABS
20.0000 mg | ORAL_TABLET | Freq: Every day | ORAL | Status: DC
  Administered 2012-04-05 (×2): 20 mg via ORAL
  Filled 2012-04-05 (×2): qty 1

## 2012-04-05 MED ORDER — ENOXAPARIN SODIUM 40 MG/0.4ML ~~LOC~~ SOLN
40.0000 mg | SUBCUTANEOUS | Status: DC
  Administered 2012-04-05 – 2012-04-06 (×2): 40 mg via SUBCUTANEOUS
  Filled 2012-04-05 (×2): qty 0.4

## 2012-04-05 MED ORDER — TRAZODONE HCL 50 MG PO TABS
50.0000 mg | ORAL_TABLET | Freq: Every evening | ORAL | Status: DC | PRN
  Administered 2012-04-05 (×2): 50 mg via ORAL
  Filled 2012-04-05 (×2): qty 1

## 2012-04-05 MED ORDER — SERTRALINE HCL 50 MG PO TABS
25.0000 mg | ORAL_TABLET | Freq: Every day | ORAL | Status: DC
  Administered 2012-04-05 (×2): 25 mg via ORAL
  Filled 2012-04-05: qty 2
  Filled 2012-04-05: qty 1

## 2012-04-05 MED ORDER — METHYLPREDNISOLONE SODIUM SUCC 125 MG IJ SOLR
60.0000 mg | Freq: Three times a day (TID) | INTRAMUSCULAR | Status: DC
  Administered 2012-04-05 – 2012-04-06 (×3): 60 mg via INTRAVENOUS
  Filled 2012-04-05 (×2): qty 2

## 2012-04-05 MED ORDER — OXYCODONE HCL 5 MG PO TABS
5.0000 mg | ORAL_TABLET | ORAL | Status: DC | PRN
  Administered 2012-04-05 – 2012-04-06 (×5): 5 mg via ORAL
  Filled 2012-04-05 (×5): qty 1

## 2012-04-05 MED ORDER — OXYCODONE-ACETAMINOPHEN 5-325 MG PO TABS: 1.0000 | ORAL_TABLET | Freq: Four times a day (QID) | ORAL | Status: DC | PRN

## 2012-04-05 MED ORDER — METHYLPREDNISOLONE SODIUM SUCC 125 MG IJ SOLR
125.0000 mg | Freq: Four times a day (QID) | INTRAMUSCULAR | Status: DC
  Administered 2012-04-05 (×2): 125 mg via INTRAVENOUS
  Filled 2012-04-05 (×2): qty 2

## 2012-04-05 MED ORDER — HYDROMORPHONE HCL PF 1 MG/ML IJ SOLN
0.5000 mg | INTRAMUSCULAR | Status: DC | PRN
Start: 2012-04-05 — End: 2012-04-05
  Administered 2012-04-05 (×3): 0.5 mg via INTRAVENOUS
  Filled 2012-04-05 (×3): qty 1

## 2012-04-05 MED ORDER — METHYLPREDNISOLONE SODIUM SUCC 125 MG IJ SOLR
60.0000 mg | Freq: Four times a day (QID) | INTRAMUSCULAR | Status: DC
  Filled 2012-04-05: qty 2

## 2012-04-05 MED ORDER — PROMETHAZINE HCL 12.5 MG PO TABS: 25.0000 mg | ORAL_TABLET | Freq: Four times a day (QID) | ORAL | Status: DC | PRN

## 2012-04-05 NOTE — H&P (Signed)
[facility] History and Physical  [name]  RUE:[id number]  DOB: 1971/07/07   DOA: *04/05/2012   PCP:   Cassell Smiles., MD   Chief Complaint:  Difficulty breathing for 2 days  HPI: [name] is an 41 y.o. female.   History of asthma treated with albuterol inhalers and nebulizers at home; reports she developed a sore throat about 2 days ago at that time started having wheezing and shortness of  Breath, which has not been relieved by her home medications. As she's gotten progressively worse she eventually came to the emergency room this evening, and after 5 hours in the emergency room getting treatment she remained short of breath, and when she got up to walk her oxygen sats dropped into the 80s. Service was called to assist with management.  She says her sore throat is now resolved, she has had no fever but she has had chills. Persistent coughing with scant amounts of blood occasional cough Albuterol nebs is giving her a migraine headache  Rewiew of Systems:   All systems negative except as marked bold or noted in the HPI;  Constitutional: Negative for malaise, fever and chills. ;  Eyes: Negative for eye pain, redness and discharge. ;  ENMT: Negative for ear pain, hoarseness, nasal congestion, sinus pressure and sore throat. ;  Cardiovascular: Negative for  palpitations, diaphoresis,and peripheral edema. ;  Respiratory: Negative for  stridor. ;  Gastrointestinal: Negative for nausea, vomiting, diarrhea, constipation, abdominal pain, melena, blood in stool, hematemesis, jaundice and rectal bleeding. unusual weight loss..   Genitourinary: Negative for frequency, dysuria, incontinence,flank pain and hematuria; Musculoskeletal: Negative for back pain and neck pain. Negative for swelling and trauma.;  Skin: . Negative for pruritus, rash, abrasions, bruising and skin lesion.; ulcerations Neuro: Negative for, lightheadedness and neck stiffness. Negative for weakness,  altered level of consciousness , altered mental status, extremity weakness, burning feet, involuntary movement, seizure and syncope.  Psych: negative for  depression, insomnia, tearfulness, panic attacks, hallucinations, paranoia, suicidal or homicidal ideation    Past Medical History  Diagnosis Date  . Asthma   . Depression   . Migraine   . Pancreatitis chronic Idiopathic    Treated by Dr. Andrey Campanile at [facility] in the past with celiac blocks.  . Bipolar 1 disorder   . Hyperlipidemia 02/03/2012  . Diabetes mellitus     Past Surgical History  Procedure Date  . Cholecystectomy   . Tonsillectomy   . Abdominal hysterectomy     Medications:  HOME MEDS: Prior to Admission medications   Medication Sig Start Date End Date Taking? Authorizing Provider  albuterol (PROVENTIL HFA;VENTOLIN HFA) 108 (90 BASE) MCG/ACT inhaler Inhale 2 puffs into the lungs daily as needed. For shortness of breath   Yes Historical Provider, MD  albuterol (PROVENTIL) (2.5 MG/3ML) 0.083% nebulizer solution Take 2.5 mg by nebulization every 6 (six) hours as needed. For shortness of breath    Yes Historical Provider, MD  amLODipine (NORVASC) 10 MG tablet Take 10 mg by mouth daily.     Yes Historical Provider, MD  atorvastatin (LIPITOR) 20 MG tablet Take 20 mg by mouth at bedtime.   Yes Historical Provider, MD  clonazePAM (KLONOPIN) 0.5 MG tablet Take 0.5 mg by mouth 3 (three) times daily as needed.   Yes Historical Provider, MD  docusate sodium (COLACE) 100 MG capsule Take 100 mg by mouth at bedtime.    Yes Historical Provider, MD  omeprazole (PRILOSEC) 20 MG capsule Take 20 mg by  mouth 3 (three) times daily.     Yes Historical Provider, MD  oxyCODONE-acetaminophen (PERCOCET/ROXICET) 5-325 MG per tablet Take 1 tablet by mouth every 6 (six) hours as needed. For pain   Yes Historical Provider, MD  promethazine (PHENERGAN) 25 MG tablet Take 25 mg by mouth every 6 (six) hours as needed. 11/07/11 04/06/12 Yes Terry S.  Colon Branch, MD  sertraline (ZOLOFT) 25 MG tablet Take 25 mg by mouth at bedtime.   Yes Historical Provider, MD     Allergies:  Allergies  Allergen Reactions  . Sulfa Antibiotics Shortness Of Breath  . Prednisone     pancreatitis    Social History:   reports that she has quit smoking. Her smoking use included Cigarettes. She has a 15 pack-year smoking history. She quit smokeless tobacco use about 4 weeks ago. She reports that she does not drink alcohol or use illicit drugs.  Family History: Family History  Problem Relation Age of Onset  . Asthma Other   . Coronary artery disease Neg Hx      Physical Exam: Filed Vitals:   04/04/12 2008 04/04/12 2128 04/04/12 2249 04/05/12 0043  BP:   140/80 129/87  Pulse:   94 93  Temp:    97.7 F (36.5 C)  TempSrc:    Oral  Resp:   26 22  Height:      Weight:    77.5 kg (170 lb 13.7 oz)  SpO2: 96% 95% 95% 95%   Blood pressure 129/87, pulse 93, temperature 97.7 F (36.5 C), temperature source Oral, resp. rate 22, height 5\' 2"  (1.575 m), weight 77.5 kg (170 lb 13.7 oz), SpO2 95.00%.  GEN:  Pleasant mildly obese middle-aged Caucasian lady lying flat  in the stretcher in no acute distress; cooperative with exam PSYCH:  alert and oriented x4; does  appear anxious; affect is appropriate. HEENT: Mucous membranes pink and anicteric; PERRLA; EOM intact; no cervical lymphadenopathy nor thyromegaly or carotid bruit; no JVD; throats is normal in appearance without hyperemia . Breasts:: Not examined CHEST WALL: No tenderness CHEST: Normal respiration, clear to auscultation bilaterally anteriorly; end expiratory wheezing posterior chest HEART: Regular rate and rhythm; no murmurs rubs or gallops BACK: no CVA tenderness ABDOMEN: Obese, soft non-tender; no masses, no organomegaly, normal abdominal bowel sounds;  no intertriginous candida. Rectal Exam: Not done EXTREMITIES: No bone or joint deformity;  no edema; no ulcerations. Genitalia: not  examined PULSES: 2+ and symmetric SKIN: Normal hydration no rash or ulceration CNS: Cranial nerves 2-12 grossly intact no focal lateralizing neurologic deficit   Labs on Admission:  Basic Metabolic Panel:  Lab 04/04/12 1610  NA 141  K 3.9  CL 106  CO2 --  GLUCOSE 154*  BUN 11  CREATININE 0.70  CALCIUM --  MG --  PHOS --   Liver Function Tests: No results found for this basename: AST:5,ALT:5,ALKPHOS:5,BILITOT:5,PROT:5,ALBUMIN:5 in the last 168 hours No results found for this basename: LIPASE:5,AMYLASE:5 in the last 168 hours No results found for this basename: AMMONIA:5 in the last 168 hours CBC:  Lab 04/04/12 2305  WBC --  NEUTROABS --  HGB 13.3  HCT 39.0  MCV --  PLT --   Cardiac Enzymes: No results found for this basename: CKTOTAL:5,CKMB:5,CKMBINDEX:5,TROPONINI:5 in the last 168 hours BNP: No components found with this basename: POCBNP:5 D-dimer: No components found with this basename: D-DIMER:5 CBG: No results found for this basename: GLUCAP:5 in the last 168 hours  Results for orders placed during the hospital encounter of 04/04/12 (  from the past 48 hour(s))  POCT I-STAT, CHEM 8     Status: Abnormal   Collection Time   04/04/12 11:05 PM      Component Value Range Comment   Sodium 141  135 - 145 mEq/L    Potassium 3.9  3.5 - 5.1 mEq/L    Chloride 106  96 - 112 mEq/L    BUN 11  6 - 23 mg/dL    Creatinine, Ser 4.78  0.50 - 1.10 mg/dL    Glucose, Bld 295 (*) 70 - 99 mg/dL    Calcium, Ion 6.21  3.08 - 1.23 mmol/L    TCO2 22  0 - 100 mmol/L    Hemoglobin 13.3  12.0 - 15.0 g/dL    HCT 65.7  84.6 - 96.2 %      Radiological Exams on Admission: Dg Chest Port 1 View  04/04/2012  *RADIOLOGY REPORT*  Clinical Data: Shortness of breath and dry cough for several days.  PORTABLE CHEST - 1 VIEW  Comparison: 03/07/2012  Findings: The heart size and pulmonary vascularity are normal. The lungs appear clear and expanded without focal air space disease or consolidation. No  blunting of the costophrenic angles.  No pneumothorax.  Mediastinal contours appear intact. Slight fibrosis in the lungs.  No significant change since previous study.  IMPRESSION: No evidence of active pulmonary disease.   Original Report Authenticated By: Marlon Pel, M.D.     EKG: Independently reviewed.  normal sinus rhythm. incomplete right bundle branch block ,not changed from previous EKG    Assessment/Plan Present on Admission:   .Asthma exacerbation.ANXIETY .Bipolar disorder .MIGRAINE HEADACHE .HYPERTENSION .GERD   PLAN:  she is having mild wheezing and there seems to be a strong component of anxiety her symptoms but since asthma can deteriorate it very rapidly and very suddenly , we'll bring her in on observation for continued treatment with steroids and nebulization. Will not give antibiotics at this time but we'll check a CMP and a BMP   will give mucolytics. Because albuterol is triggering her migraines will give her a trial of Xopenex  Other plans as per orders.  Code Status: FULL CODE  Family Communication:  care discussed only with patient  Disposition Plan: discharge home later in the morning if patient remains stable    Darenda Fike Nocturnist [facility] Pager [phone]   04/05/2012, 12:46 AM

## 2012-04-05 NOTE — Progress Notes (Signed)
[facility] PROGRESS NOTE  [name]:[id number] DOB: 27-Feb-1971 DOA: 04/04/2012 PCP: Cassell Smiles., MD  Assessment/Plan: Active Problems:  ANXIETY  Bipolar disorder  MIGRAINE HEADACHE  HYPERTENSION  GERD  Asthma exacerbation  Acute asthma exacerbation Likely related to her recent symptoms of sore throat and? Allergy He should admitted to medical floor and started on IV Solu Medrol, nebs  -Symptoms better this morning with a minimal wheezing. Reduced dose of IV Solu Medrol to 60 mg every 8 hours. -Continue with Xopenex nebs.  - informs taking albuterol inhaler and as needed nebs at home. Will likely benefit from prescribing Advair on discharge -She informs quitting smoking about a month back and I have encouraged her for that.  Anxiety Symptoms of asthma exacerbation seems to have been triggered by anxiety as well. She informs having a lot of stress at home recently. Continue with Klonopin.  Hypertension  Continue amlodipine  Hyperlipidemia Continue  Lipitor  Chronic back pains Continue oxycodone at home dose  History of? GERD Continue Protonix  D V. T prophylaxis Subcutaneous Lovenox  Code Status: full Family Communication: none Disposition Plan: likely in next 1-2 days if SOB improves   Brief narrative: 41 year old female with history off anxiety, asthma symptoms , hypertension presented with acute onset of shortness of breath with wheezing likely in the setting of asthma exacerbation for the triggered with anxiety.  Consultants:  None  Procedures:  None  Antibiotics:  None  HPI/Subjective: Feels her shortness of breath to be slightly better today however short of breath on getting out of bed but denies feeling wheezy.  Objective: Filed Vitals:   04/05/12 0521 04/05/12 0737 04/05/12 1000 04/05/12 1005  BP: 106/70  91/59 91/59  Pulse: 81   85  Temp: 97.7 F (36.5 C)   97.9 F (36.6 C)  TempSrc: Oral     Resp: 20   19    Height:      Weight:      SpO2: 98% 95%  96%    Intake/Output Summary (Last 24 hours) at 04/05/12 1145 Last data filed at 04/05/12 0913  Gross per 24 hour  Intake    120 ml  Output    550 ml  Net   -430 ml   Filed Weights   04/04/12 1927 04/05/12 0043  Weight: 77.111 kg (170 lb) 77.5 kg (170 lb 13.7 oz)    Exam:   General: Middle aged female in no acute distress  HEENT: No pallor, moist oral mucosa.  Cardiovascular: Normal S1 and S2, no murmurs  Respiratory: Few scattered rhonchi  Abdomen: Soft, nontender nondistended bowel sounds present  Extremities: Warm no edema  CNS: AAO x3, nonfocal  Data Reviewed: Basic Metabolic Panel:  Lab 04/05/12 8119 04/04/12 2305  NA 134* 141  K 3.7 3.9  CL 100 106  CO2 22 --  GLUCOSE 264* 154*  BUN 10 11  CREATININE 0.59 0.70  CALCIUM 9.2 --  MG -- --  PHOS -- --   Liver Function Tests: No results found for this basename: AST:5,ALT:5,ALKPHOS:5,BILITOT:5,PROT:5,ALBUMIN:5 in the last 168 hours No results found for this basename: LIPASE:5,AMYLASE:5 in the last 168 hours No results found for this basename: AMMONIA:5 in the last 168 hours CBC:  Lab 04/05/12 0056 04/04/12 2305  WBC 10.8* --  NEUTROABS 10.1* --  HGB 13.7 13.3  HCT 40.5 39.0  MCV 92.3 --  PLT 208 --   Cardiac Enzymes: No results found for this basename: CKTOTAL:5,CKMB:5,CKMBINDEX:5,TROPONINI:5 in the last 168 hours  BNP (last 3 results) No results found for this basename: PROBNP:3 in the last 8760 hours CBG: No results found for this basename: GLUCAP:5 in the last 168 hours  No results found for this or any previous visit (from the past 240 hour(s)).   Studies: Dg Chest Port 1 View  04/04/2012  *RADIOLOGY REPORT*  Clinical Data: Shortness of breath and dry cough for several days.  PORTABLE CHEST - 1 VIEW  Comparison: 03/07/2012  Findings: The heart size and pulmonary vascularity are normal. The lungs appear clear and expanded without focal air space  disease or consolidation. No blunting of the costophrenic angles.  No pneumothorax.  Mediastinal contours appear intact. Slight fibrosis in the lungs.  No significant change since previous study.  IMPRESSION: No evidence of active pulmonary disease.   Original Report Authenticated By: Marlon Pel, M.D.     Scheduled Meds:   . albuterol  10 mg Nebulization Once  . albuterol  2.5 mg Nebulization Once  . albuterol  2.5 mg Nebulization Once  . albuterol  5 mg Nebulization Once  . amLODipine  10 mg Oral Daily  . atorvastatin  20 mg Oral QHS  . docusate sodium  100 mg Oral QHS  . enoxaparin (LOVENOX) injection  40 mg Subcutaneous Q24H  . ipratropium  0.5 mg Nebulization Once  . ipratropium  0.5 mg Nebulization Once  . levalbuterol  1.25 mg Nebulization Q6H  . methylPREDNISolone (SOLU-MEDROL) injection  125 mg Intravenous Once  . methylPREDNISolone (SOLU-MEDROL) injection  60 mg Intravenous Q6H  .  morphine injection  4 mg Intravenous Once  . oxyCODONE-acetaminophen  1 tablet Oral Once  . pantoprazole  40 mg Oral BID AC  . sertraline  25 mg Oral QHS  . DISCONTD: methylPREDNISolone (SOLU-MEDROL) injection  125 mg Intravenous Q6H  . DISCONTD: methylPREDNISolone (SOLU-MEDROL) injection  60 mg Intravenous Q6H  . DISCONTD: pantoprazole  80 mg Oral BID AC   Continuous Infusions:     Time spent: 30 minutes    Cotton Beckley  [facility] Pager [phone]. If 8PM-8AM, please contact night-coverage at [email or web address], password [facility] 04/05/2012, 11:45 AM  LOS: 1 day

## 2012-04-05 NOTE — Progress Notes (Signed)
UR Chart Review Completed  

## 2012-04-06 MED ORDER — ACETAMINOPHEN 325 MG PO TABS: 650.0000 mg | ORAL_TABLET | ORAL | Status: DC | PRN

## 2012-04-06 MED ORDER — PREDNISONE 10 MG PO TABS: ORAL_TABLET | ORAL | Status: DC

## 2012-04-06 NOTE — Discharge Summary (Signed)
Physician Discharge Summary  Patient ID: ALARIA CHIUSANO MRN: [id number] DOB/AGE: 09-09-1970 41 y.o.  Admit date: 04/04/2012 Discharge date: 04/06/2012  Discharge Diagnoses:  Active Problems:  Bipolar disorder  MIGRAINE HEADACHE  GERD  ANXIETY  HYPERTENSION  Asthma exacerbation     Medication List     As of 04/06/2012 11:32 AM    TAKE these medications         acetaminophen 325 MG tablet   Commonly known as: TYLENOL   Take 2 tablets (650 mg total) by mouth every 4 (four) hours as needed for pain or fever (or Fever >/= 101).      albuterol 108 (90 BASE) MCG/ACT inhaler   Commonly known as: PROVENTIL HFA;VENTOLIN HFA   Inhale 2 puffs into the lungs daily as needed. For shortness of breath      albuterol (2.5 MG/3ML) 0.083% nebulizer solution   Commonly known as: PROVENTIL   Take 2.5 mg by nebulization every 6 (six) hours as needed. For shortness of breath      amLODipine 10 MG tablet   Commonly known as: NORVASC   Take 10 mg by mouth daily.      atorvastatin 20 MG tablet   Commonly known as: LIPITOR   Take 20 mg by mouth at bedtime.      clonazePAM 0.5 MG tablet   Commonly known as: KLONOPIN   Take 0.5 mg by mouth 3 (three) times daily as needed.      docusate sodium 100 MG capsule   Commonly known as: COLACE   Take 100 mg by mouth at bedtime.      omeprazole 20 MG capsule   Commonly known as: PRILOSEC   Take 20 mg by mouth 3 (three) times daily.      oxyCODONE-acetaminophen 5-325 MG per tablet   Commonly known as: PERCOCET/ROXICET   Take 1 tablet by mouth every 6 (six) hours as needed. For pain      predniSONE 10 MG tablet   Commonly known as: DELTASONE   4 tablets Thursday, 3 tablets Friday, 2 tablets Saturday, 1 tablet Sunday      promethazine 25 MG tablet   Commonly known as: PHENERGAN   Take 25 mg by mouth every 6 (six) hours as needed.      sertraline 25 MG tablet   Commonly known as: ZOLOFT   Take 25 mg by mouth at bedtime.              Discharge Orders    Future Orders Please Complete By Expires   Diet general      Activity as tolerated - No restrictions         Follow-up Information    Follow up with Cassell Smiles., MD. (If symptoms worsen)    Contact information:   [address] [place] [address] [phone]          Disposition: 01-Home or Self Care  Discharged Condition: stable  Consults:  none  Labs:   Results for orders placed during the hospital encounter of 04/04/12 (from the past 48 hour(s))  POCT I-STAT, CHEM 8     Status: Abnormal   Collection Time   04/04/12 11:05 PM      Component Value Range Comment   Sodium 141  135 - 145 mEq/L    Potassium 3.9  3.5 - 5.1 mEq/L    Chloride 106  96 - 112 mEq/L    BUN 11  6 - 23 mg/dL  Creatinine, Ser 0.70  0.50 - 1.10 mg/dL    Glucose, Bld 629 (*) 70 - 99 mg/dL    Calcium, Ion 5.28  4.13 - 1.23 mmol/L    TCO2 22  0 - 100 mmol/L    Hemoglobin 13.3  12.0 - 15.0 g/dL    HCT 24.4  01.0 - 27.2 %   CBC WITH DIFFERENTIAL     Status: Abnormal   Collection Time   04/05/12 12:56 AM      Component Value Range Comment   WBC 10.8 (*) 4.0 - 10.5 K/uL    RBC 4.39  3.87 - 5.11 MIL/uL    Hemoglobin 13.7  12.0 - 15.0 g/dL    HCT 53.6  64.4 - 03.4 %    MCV 92.3  78.0 - 100.0 fL    MCH 31.2  26.0 - 34.0 pg    MCHC 33.8  30.0 - 36.0 g/dL    RDW 74.2  59.5 - 63.8 %    Platelets 208  150 - 400 K/uL    Neutrophils Relative 92 (*) 43 - 77 %    Neutro Abs 10.1 (*) 1.7 - 7.7 K/uL    Lymphocytes Relative 6 (*) 12 - 46 %    Lymphs Abs 0.6 (*) 0.7 - 4.0 K/uL    Monocytes Relative 1 (*) 3 - 12 %    Monocytes Absolute 0.1  0.1 - 1.0 K/uL    Eosinophils Relative 1  0 - 5 %    Eosinophils Absolute 0.1  0.0 - 0.7 K/uL    Basophils Relative 0  0 - 1 %    Basophils Absolute 0.0  0.0 - 0.1 K/uL   BASIC METABOLIC PANEL     Status: Abnormal   Collection Time   04/05/12 12:56 AM      Component Value Range Comment   Sodium 134 (*) 135 - 145 mEq/L  CORRECTED ON 09/10 AT 0147: PREVIOUSLY REPORTED AS DELTA CHECK NOTED   Potassium 3.7  3.5 - 5.1 mEq/L    Chloride 100  96 - 112 mEq/L    CO2 22  19 - 32 mEq/L    Glucose, Bld 264 (*) 70 - 99 mg/dL    BUN 10  6 - 23 mg/dL    Creatinine, Ser 7.56  0.50 - 1.10 mg/dL    Calcium 9.2  8.4 - 43.3 mg/dL    GFR calc non Af Amer >90  >90 mL/min    GFR calc Af Amer >90  >90 mL/min   URINALYSIS, ROUTINE W REFLEX MICROSCOPIC     Status: Abnormal   Collection Time   04/05/12  1:09 AM      Component Value Range Comment   Color, Urine YELLOW  YELLOW    APPearance CLEAR  CLEAR    Specific Gravity, Urine >1.030 (*) 1.005 - 1.030    pH 5.5  5.0 - 8.0    Glucose, UA 500 (*) NEGATIVE mg/dL    Hgb urine dipstick NEGATIVE  NEGATIVE    Bilirubin Urine NEGATIVE  NEGATIVE    Ketones, ur NEGATIVE  NEGATIVE mg/dL    Protein, ur NEGATIVE  NEGATIVE mg/dL    Urobilinogen, UA 0.2  0.0 - 1.0 mg/dL    Nitrite NEGATIVE  NEGATIVE    Leukocytes, UA NEGATIVE  NEGATIVE MICROSCOPIC NOT DONE ON URINES WITH NEGATIVE PROTEIN, BLOOD, LEUKOCYTES, NITRITE, OR GLUCOSE <1000 mg/dL.    Diagnostics:  Dg Chest 2 View  03/07/2012  *RADIOLOGY REPORT*  Clinical Data:  Shortness breath, cough.  CHEST - 2 VIEW  Comparison: 02/03/2012  Findings: Small hiatal hernia.  Old vertebral compression fracture deformity near the thoracolumbar junction.  Vascular clips in the right upper abdomen.  Coarse infrahilar interstitial markings as before.  No confluent airspace infiltrate or overt edema.  No effusion.  Heart size normal.  IMPRESSION:  1.  Chronic changes as above.  No acute disease.  Original Report Authenticated By: Osa Craver, M.D.   Dg Chest Port 1 View  04/04/2012  *RADIOLOGY REPORT*  Clinical Data: Shortness of breath and dry cough for several days.  PORTABLE CHEST - 1 VIEW  Comparison: 03/07/2012  Findings: The heart size and pulmonary vascularity are normal. The lungs appear clear and expanded without focal air space disease  or consolidation. No blunting of the costophrenic angles.  No pneumothorax.  Mediastinal contours appear intact. Slight fibrosis in the lungs.  No significant change since previous study.  IMPRESSION: No evidence of active pulmonary disease.   Original Report Authenticated By: Marlon Pel, M.D.     Procedures:  none  EKG:  NSR with incomplete RBBB  Full Code   Hospital Course: See H&P for complete admission details.  Rs. Mccommons is a 41 year old white female who presented with worsening dyspnea and cough.  In the ED after bronchodilators, her oxygen sats dropped into the 80s, so the hospitalists were called for admission.  In the ED, she was noted to be anxious, but no respiratory distress.  She had mild end-expiratory wheeze.  She was admitted.  Started on bronchodilators, steroids.  She refused several nebulizer treatments, because they reportedly caused migraines.  At discharge, she had clear lung sounds, normal vitals.  Total time on the day of discharge greater than 30 minutes.  Discharge Exam:  Blood pressure 104/70, pulse 90, temperature 98.1 F (36.7 C), temperature source Oral, resp. rate 18, height 5\' 2"  (1.575 m), weight 76.2 kg (167 lb 15.9 oz), SpO2 97.00%.  Gen:  Comfortable, breathing non-labored. Lungs CTA without WRR  Signed: Lollie Gunner L 04/06/2012, 11:32 AM

## 2012-05-23 ENCOUNTER — Encounter (HOSPITAL_COMMUNITY): Payer: Self-pay

## 2012-05-23 ENCOUNTER — Emergency Department (HOSPITAL_COMMUNITY): Payer: Self-pay

## 2012-05-23 ENCOUNTER — Emergency Department (HOSPITAL_COMMUNITY)
Admission: EM | Admit: 2012-05-23 | Discharge: 2012-05-24 | Disposition: A | Discharge status: Home / Self Care | Payer: Self-pay | Attending: Emergency Medicine | Admitting: Emergency Medicine

## 2012-05-23 DIAGNOSIS — Z87891 Personal history of nicotine dependence: Secondary | ICD-10-CM | POA: Insufficient documentation

## 2012-05-23 DIAGNOSIS — J45909 Unspecified asthma, uncomplicated: Secondary | ICD-10-CM | POA: Insufficient documentation

## 2012-05-23 DIAGNOSIS — F329 Major depressive disorder, single episode, unspecified: Secondary | ICD-10-CM | POA: Insufficient documentation

## 2012-05-23 DIAGNOSIS — G43909 Migraine, unspecified, not intractable, without status migrainosus: Secondary | ICD-10-CM | POA: Insufficient documentation

## 2012-05-23 DIAGNOSIS — F3289 Other specified depressive episodes: Secondary | ICD-10-CM | POA: Insufficient documentation

## 2012-05-23 DIAGNOSIS — E785 Hyperlipidemia, unspecified: Secondary | ICD-10-CM | POA: Insufficient documentation

## 2012-05-23 DIAGNOSIS — Z79899 Other long term (current) drug therapy: Secondary | ICD-10-CM | POA: Insufficient documentation

## 2012-05-23 DIAGNOSIS — K861 Other chronic pancreatitis: Secondary | ICD-10-CM | POA: Insufficient documentation

## 2012-05-23 DIAGNOSIS — R112 Nausea with vomiting, unspecified: Secondary | ICD-10-CM | POA: Insufficient documentation

## 2012-05-23 DIAGNOSIS — E119 Type 2 diabetes mellitus without complications: Secondary | ICD-10-CM | POA: Insufficient documentation

## 2012-05-23 DIAGNOSIS — M549 Dorsalgia, unspecified: Secondary | ICD-10-CM | POA: Insufficient documentation

## 2012-05-23 DIAGNOSIS — R062 Wheezing: Secondary | ICD-10-CM | POA: Insufficient documentation

## 2012-05-23 DIAGNOSIS — F319 Bipolar disorder, unspecified: Secondary | ICD-10-CM | POA: Insufficient documentation

## 2012-05-23 MED ORDER — IPRATROPIUM BROMIDE 0.02 % IN SOLN: 0.5000 mg | Freq: Once | RESPIRATORY_TRACT | Status: DC

## 2012-05-23 MED ORDER — IPRATROPIUM BROMIDE HFA 17 MCG/ACT IN AERS: 2.0000 | INHALATION_SPRAY | Freq: Once | RESPIRATORY_TRACT | Status: DC

## 2012-05-23 MED ORDER — MAGNESIUM HYDROXIDE 400 MG/5ML PO SUSP
30.0000 mL | Freq: Once | ORAL | Status: AC
  Administered 2012-05-24: 30 mL via ORAL
  Filled 2012-05-23: qty 30

## 2012-05-23 MED ORDER — PROMETHAZINE HCL 12.5 MG PO TABS: 25.0000 mg | ORAL_TABLET | Freq: Once | ORAL | Status: DC

## 2012-05-23 MED ORDER — IPRATROPIUM BROMIDE 0.02 % IN SOLN
0.5000 mg | Freq: Once | RESPIRATORY_TRACT | Status: AC
  Administered 2012-05-23: 0.5 mg via RESPIRATORY_TRACT
  Filled 2012-05-23: qty 2.5

## 2012-05-23 MED ORDER — ALBUTEROL SULFATE (5 MG/ML) 0.5% IN NEBU
2.5000 mg | INHALATION_SOLUTION | Freq: Once | RESPIRATORY_TRACT | Status: AC
  Administered 2012-05-23: 2.5 mg via RESPIRATORY_TRACT
  Filled 2012-05-23: qty 0.5

## 2012-05-23 NOTE — ED Notes (Signed)
I have been having nausea and hurting in my back per pt. Started having the symptoms on Friday per pt. Started vomiting today per pt.

## 2012-05-23 NOTE — ED Notes (Signed)
Pt states nausea started on Friday, mainly after a meal and has progressed over the weekend to N/V. Denies diarrhea and fever.  Pt reports having a "flare-up of asthma after arriving at hospital". PA-C at bedside examining pt and discussing plan of care. Pt has history of pancreatitis and asthma.

## 2012-05-23 NOTE — ED Provider Notes (Signed)
History     CSN: [id number]  Arrival date & time 05/23/12  2154   None     Chief Complaint  Patient presents with  . Nausea  . Back Pain    (Consider location/radiation/quality/duration/timing/severity/associated sxs/prior treatment) HPI Comments: Pt has a hx of asthma, pancreatitis, bibopar, diabetes an depression. She has had Nausea since Friday 10/25, mostly  after eating. Asthma started getting worse  tonight. Pain right back and flank worse with cough and certain movements. She had an episode of vomiting tonight. No hemoptysis or  Hematemesis.No high fevers.  Patient is a 41 y.o. female presenting with back pain. The history is provided by the patient.  Back Pain  Pertinent negatives include no chest pain, no fever and no dysuria.    Past Medical History  Diagnosis Date  . Asthma   . Depression   . Migraine   . Pancreatitis chronic Idiopathic    Treated by Dr. Andrey Campanile at [facility] in the past with celiac blocks.  . Bipolar 1 disorder   . Hyperlipidemia 02/03/2012  . Diabetes mellitus     Past Surgical History  Procedure Date  . Cholecystectomy   . Tonsillectomy   . Abdominal hysterectomy     Family History  Problem Relation Age of Onset  . Asthma Other   . Coronary artery disease Neg Hx     History  Substance Use Topics  . Smoking status: Former Smoker -- 1.0 packs/day for 15 years    Types: Cigarettes  . Smokeless tobacco: Former Neurosurgeon    Quit date: 03/05/2012  . Alcohol Use: No    OB History    Grav Para Term Preterm Abortions TAB SAB Ect Mult Living                  Review of Systems  Constitutional: Positive for fatigue. Negative for fever and activity change.       All ROS Neg except as noted in HPI  HENT: Negative for nosebleeds and neck pain.   Eyes: Negative for photophobia and discharge.  Respiratory: Positive for cough, shortness of breath and wheezing.   Cardiovascular: Negative for chest pain and palpitations.    Gastrointestinal: Positive for vomiting. Negative for blood in stool.  Genitourinary: Negative for dysuria, frequency and hematuria.  Musculoskeletal: Positive for back pain. Negative for arthralgias.  Skin: Negative.   Neurological: Negative for dizziness, seizures and speech difficulty.  Psychiatric/Behavioral: Negative for hallucinations and confusion.    Allergies  Sulfa antibiotics; Penicillins; and Prednisone  Home Medications   Current Outpatient Rx  Name Route Sig Dispense Refill  . ALBUTEROL SULFATE HFA 108 (90 BASE) MCG/ACT IN AERS Inhalation Inhale 2 puffs into the lungs daily as needed. For shortness of breath    . AMLODIPINE BESYLATE 10 MG PO TABS Oral Take 10 mg by mouth every morning.     Marland Kitchen CLONAZEPAM 0.5 MG PO TABS Oral Take 0.5 mg by mouth 3 (three) times daily as needed. For anxiety    . DOCUSATE SODIUM 100 MG PO CAPS Oral Take 100 mg by mouth at bedtime.     . OMEPRAZOLE 20 MG PO CPDR Oral Take 20 mg by mouth 3 (three) times daily.      . OXYCODONE-ACETAMINOPHEN 5-325 MG PO TABS Oral Take 1 tablet by mouth every 6 (six) hours as needed. For pain    . SERTRALINE HCL 25 MG PO TABS Oral Take 25 mg by mouth at bedtime.    . ALBUTEROL SULFATE (  2.5 MG/3ML) 0.083% IN NEBU Nebulization Take 2.5 mg by nebulization every 6 (six) hours as needed. For shortness of breath     . PROMETHAZINE HCL 25 MG PO TABS Oral Take 25 mg by mouth every 6 (six) hours as needed.      BP 128/80  Pulse 92  Temp 98.2 F (36.8 C) (Oral)  Resp 20  Ht 5\' 2"  (1.575 m)  Wt 162 lb (73.483 kg)  BMI 29.63 kg/m2  SpO2 99%  Physical Exam  Nursing note and vitals reviewed. Constitutional: She is oriented to person, place, and time. She appears well-developed and well-nourished.  Non-toxic appearance.  HENT:  Head: Normocephalic.  Right Ear: Tympanic membrane and external ear normal.  Left Ear: Tympanic membrane and external ear normal.  Eyes: EOM and lids are normal. Pupils are equal, round, and  reactive to light.  Neck: Normal range of motion. Neck supple. Carotid bruit is not present.  Cardiovascular: Normal rate, regular rhythm, normal heart sounds, intact distal pulses and normal pulses.   Pulmonary/Chest: No respiratory distress. She has wheezes. She has rhonchi.  Abdominal: Soft. Bowel sounds are normal. There is no tenderness. There is no guarding.  Musculoskeletal: Normal range of motion.  Lymphadenopathy:       Head (right side): No submandibular adenopathy present.       Head (left side): No submandibular adenopathy present.    She has no cervical adenopathy.  Neurological: She is alert and oriented to person, place, and time. She has normal strength. No cranial nerve deficit or sensory deficit.  Skin: Skin is warm and dry.  Psychiatric: She has a normal mood and affect. Her speech is normal.    ED Course  Procedures (including critical care time)  Labs Reviewed - No data to display No results found.   No diagnosis found.    MDM  I have reviewed nursing notes, vital signs, and all appropriate lab and imaging results for this patient. Patient presented to the emergency department tonight with nausea, pain in the back and right flank area, and an episode of vomiting. The patient states that she tried a tablet of her promethazine but it did not seem to help.  The patient was noted to have audible wheezing at the bedside and seemed to be laboring with her breathing. The patient was treated with albuterol and Atrovent with significant improvement in the wheezing and breathing.  The patient had a an acute abdomen films which revealed gaseous distention of the small bowel at the mid abdomen without fluid level. Moderate stool was present throughout the colon. It was felt that the patient may be developing an ileus, but there was no obstruction. The patient is treated with milk of magnesia here in the emergency department the patient was also treated with Zofran for the  nausea. Marland Kitchen a prescription for the Zofran is given to the patient to take home. The patient is encouraged to return if any changes, problems, or concerns.       Kathie Dike, [place] 05/24/12 [phone]

## 2012-05-24 MED ORDER — ONDANSETRON 8 MG PO TBDP
8.0000 mg | ORAL_TABLET | Freq: Once | ORAL | Status: AC
  Administered 2012-05-24: 8 mg via ORAL
  Filled 2012-05-24: qty 1

## 2012-05-24 MED ORDER — ONDANSETRON HCL 4 MG PO TABS: ORAL_TABLET | ORAL | Status: DC

## 2012-05-25 NOTE — ED Provider Notes (Signed)
Medical screening examination/treatment/procedure(s) were performed by non-physician practitioner and as supervising physician I was immediately available for consultation/collaboration.  Miasha Emmons S. Jaxson Anglin, MD 05/25/12 0819 

## 2012-06-07 ENCOUNTER — Observation Stay (HOSPITAL_COMMUNITY): Payer: Medicaid Other

## 2012-06-07 ENCOUNTER — Emergency Department (HOSPITAL_COMMUNITY): Payer: Medicaid Other

## 2012-06-07 ENCOUNTER — Inpatient Hospital Stay (HOSPITAL_COMMUNITY)
Admission: EM | Admit: 2012-06-07 | Discharge: 2012-06-08 | DRG: 190 | Disposition: A | Discharge status: Home / Self Care | Payer: Medicaid Other | Attending: Internal Medicine | Admitting: Internal Medicine

## 2012-06-07 ENCOUNTER — Encounter (HOSPITAL_COMMUNITY): Payer: Self-pay

## 2012-06-07 DIAGNOSIS — M542 Cervicalgia: Secondary | ICD-10-CM

## 2012-06-07 DIAGNOSIS — T380X5A Adverse effect of glucocorticoids and synthetic analogues, initial encounter: Secondary | ICD-10-CM | POA: Diagnosis present

## 2012-06-07 DIAGNOSIS — R202 Paresthesia of skin: Secondary | ICD-10-CM | POA: Diagnosis present

## 2012-06-07 DIAGNOSIS — F319 Bipolar disorder, unspecified: Secondary | ICD-10-CM

## 2012-06-07 DIAGNOSIS — M62838 Other muscle spasm: Secondary | ICD-10-CM

## 2012-06-07 DIAGNOSIS — G43709 Chronic migraine without aura, not intractable, without status migrainosus: Secondary | ICD-10-CM | POA: Diagnosis present

## 2012-06-07 DIAGNOSIS — J45901 Unspecified asthma with (acute) exacerbation: Secondary | ICD-10-CM | POA: Diagnosis not present

## 2012-06-07 DIAGNOSIS — D72829 Elevated white blood cell count, unspecified: Secondary | ICD-10-CM | POA: Diagnosis present

## 2012-06-07 DIAGNOSIS — H6690 Otitis media, unspecified, unspecified ear: Secondary | ICD-10-CM

## 2012-06-07 DIAGNOSIS — E119 Type 2 diabetes mellitus without complications: Secondary | ICD-10-CM | POA: Diagnosis present

## 2012-06-07 DIAGNOSIS — D509 Iron deficiency anemia, unspecified: Secondary | ICD-10-CM

## 2012-06-07 DIAGNOSIS — E876 Hypokalemia: Secondary | ICD-10-CM | POA: Diagnosis not present

## 2012-06-07 DIAGNOSIS — K8500 Idiopathic acute pancreatitis without necrosis or infection: Secondary | ICD-10-CM

## 2012-06-07 DIAGNOSIS — F411 Generalized anxiety disorder: Secondary | ICD-10-CM

## 2012-06-07 DIAGNOSIS — E785 Hyperlipidemia, unspecified: Secondary | ICD-10-CM

## 2012-06-07 DIAGNOSIS — K222 Esophageal obstruction: Secondary | ICD-10-CM

## 2012-06-07 DIAGNOSIS — K921 Melena: Secondary | ICD-10-CM

## 2012-06-07 DIAGNOSIS — J9601 Acute respiratory failure with hypoxia: Secondary | ICD-10-CM | POA: Diagnosis present

## 2012-06-07 DIAGNOSIS — T50905A Adverse effect of unspecified drugs, medicaments and biological substances, initial encounter: Secondary | ICD-10-CM

## 2012-06-07 DIAGNOSIS — G43909 Migraine, unspecified, not intractable, without status migrainosus: Secondary | ICD-10-CM

## 2012-06-07 DIAGNOSIS — K219 Gastro-esophageal reflux disease without esophagitis: Secondary | ICD-10-CM | POA: Diagnosis not present

## 2012-06-07 DIAGNOSIS — F3130 Bipolar disorder, current episode depressed, mild or moderate severity, unspecified: Secondary | ICD-10-CM | POA: Diagnosis present

## 2012-06-07 DIAGNOSIS — J45909 Unspecified asthma, uncomplicated: Secondary | ICD-10-CM

## 2012-06-07 DIAGNOSIS — Z720 Tobacco use: Secondary | ICD-10-CM | POA: Diagnosis present

## 2012-06-07 DIAGNOSIS — I10 Essential (primary) hypertension: Secondary | ICD-10-CM

## 2012-06-07 DIAGNOSIS — F17200 Nicotine dependence, unspecified, uncomplicated: Secondary | ICD-10-CM | POA: Diagnosis present

## 2012-06-07 DIAGNOSIS — J9600 Acute respiratory failure, unspecified whether with hypoxia or hypercapnia: Secondary | ICD-10-CM | POA: Diagnosis present

## 2012-06-07 DIAGNOSIS — R200 Anesthesia of skin: Secondary | ICD-10-CM | POA: Diagnosis present

## 2012-06-07 DIAGNOSIS — K449 Diaphragmatic hernia without obstruction or gangrene: Secondary | ICD-10-CM

## 2012-06-07 DIAGNOSIS — R109 Unspecified abdominal pain: Secondary | ICD-10-CM

## 2012-06-07 DIAGNOSIS — Z23 Encounter for immunization: Secondary | ICD-10-CM

## 2012-06-07 DIAGNOSIS — R739 Hyperglycemia, unspecified: Secondary | ICD-10-CM | POA: Diagnosis present

## 2012-06-07 DIAGNOSIS — R209 Unspecified disturbances of skin sensation: Secondary | ICD-10-CM | POA: Diagnosis present

## 2012-06-07 DIAGNOSIS — J441 Chronic obstructive pulmonary disease with (acute) exacerbation: Principal | ICD-10-CM | POA: Diagnosis present

## 2012-06-07 DIAGNOSIS — H6090 Unspecified otitis externa, unspecified ear: Secondary | ICD-10-CM

## 2012-06-07 DIAGNOSIS — R197 Diarrhea, unspecified: Secondary | ICD-10-CM

## 2012-06-07 HISTORY — DX: Cervicalgia: M54.2

## 2012-06-07 HISTORY — DX: Chronic obstructive pulmonary disease, unspecified: J44.9

## 2012-06-07 LAB — COMPREHENSIVE METABOLIC PANEL
AST: 19 U/L (ref 0–37)
Albumin: 4.1 g/dL (ref 3.5–5.2)
CO2: 26 mEq/L (ref 19–32)
Calcium: 9.8 mg/dL (ref 8.4–10.5)
Creatinine, Ser: 0.59 mg/dL (ref 0.50–1.10)
GFR calc non Af Amer: >90 mL/min (ref >90)
Sodium: 137 mEq/L (ref 135–145)
Total Bilirubin: 0.3 mg/dL (ref 0.3–1.2)
Total Protein: 7.2 g/dL (ref 6.0–8.3)

## 2012-06-07 LAB — TROPONIN I: Troponin I: <0.3 ng/mL (ref <0.30)

## 2012-06-07 LAB — CBC WITH DIFFERENTIAL/PLATELET
Basophils Absolute: 0.1 10*3/uL (ref 0.0–0.1)
Basophils Relative: 0 % (ref 0–1)
Eosinophils Absolute: 1.3 10*3/uL — ABNORMAL HIGH (ref 0.0–0.7)
Eosinophils Relative: 11 % — ABNORMAL HIGH (ref 0–5)
HCT: 41 % (ref 36.0–46.0)
MCH: 31.2 pg (ref 26.0–34.0)
MCHC: 34.4 g/dL (ref 30.0–36.0)
MCV: 90.7 fL (ref 78.0–100.0)
Monocytes Absolute: 0.6 10*3/uL (ref 0.1–1.0)
Neutrophils Relative %: 61 % (ref 43–77)
Platelets: 210 10*3/uL (ref 150–400)
RDW: 12 % (ref 11.5–15.5)

## 2012-06-07 MED ORDER — ACETAMINOPHEN 325 MG PO TABS
650.0000 mg | ORAL_TABLET | Freq: Four times a day (QID) | ORAL | Status: DC | PRN
  Administered 2012-06-08 (×2): 650 mg via ORAL
  Filled 2012-06-07 (×2): qty 2

## 2012-06-07 MED ORDER — SERTRALINE HCL 50 MG PO TABS
25.0000 mg | ORAL_TABLET | Freq: Every day | ORAL | Status: DC
  Administered 2012-06-07: 25 mg via ORAL
  Filled 2012-06-07: qty 1

## 2012-06-07 MED ORDER — ALBUTEROL SULFATE (5 MG/ML) 0.5% IN NEBU
5.0000 mg | INHALATION_SOLUTION | Freq: Once | RESPIRATORY_TRACT | Status: AC
  Administered 2012-06-07: 5 mg via RESPIRATORY_TRACT

## 2012-06-07 MED ORDER — SODIUM CHLORIDE 0.9 % IV SOLN
INTRAVENOUS | Status: AC
  Administered 2012-06-07: 23:00:00 via INTRAVENOUS

## 2012-06-07 MED ORDER — IPRATROPIUM BROMIDE 0.02 % IN SOLN
0.5000 mg | Freq: Once | RESPIRATORY_TRACT | Status: AC
  Administered 2012-06-07: 0.5 mg via RESPIRATORY_TRACT

## 2012-06-07 MED ORDER — MAGNESIUM SULFATE 40 MG/ML IJ SOLN
2.0000 g | Freq: Once | INTRAMUSCULAR | Status: AC
  Administered 2012-06-07: 2 g via INTRAVENOUS
  Filled 2012-06-07: qty 50

## 2012-06-07 MED ORDER — PANTOPRAZOLE SODIUM 40 MG PO TBEC
40.0000 mg | DELAYED_RELEASE_TABLET | Freq: Two times a day (BID) | ORAL | Status: DC
  Administered 2012-06-08 (×2): 40 mg via ORAL
  Filled 2012-06-07 (×2): qty 1

## 2012-06-07 MED ORDER — MORPHINE SULFATE 2 MG/ML IJ SOLN
2.0000 mg | Freq: Once | INTRAMUSCULAR | Status: AC
  Administered 2012-06-07: 2 mg via INTRAVENOUS
  Filled 2012-06-07: qty 1

## 2012-06-07 MED ORDER — METHYLPREDNISOLONE SODIUM SUCC 125 MG IJ SOLR
125.0000 mg | Freq: Once | INTRAMUSCULAR | Status: AC
  Administered 2012-06-07: 125 mg via INTRAVENOUS
  Filled 2012-06-07: qty 2

## 2012-06-07 MED ORDER — METHYLPREDNISOLONE SODIUM SUCC 125 MG IJ SOLR
60.0000 mg | Freq: Four times a day (QID) | INTRAMUSCULAR | Status: DC
  Administered 2012-06-07: 60 mg via INTRAVENOUS
  Administered 2012-06-08: 62.5 mg via INTRAVENOUS
  Administered 2012-06-08 (×2): 60 mg via INTRAVENOUS
  Filled 2012-06-07 (×4): qty 2

## 2012-06-07 MED ORDER — ONDANSETRON HCL 4 MG/2ML IJ SOLN: 4.0000 mg | Freq: Four times a day (QID) | INTRAMUSCULAR | Status: DC | PRN

## 2012-06-07 MED ORDER — CLONAZEPAM 0.5 MG PO TABS
0.5000 mg | ORAL_TABLET | Freq: Three times a day (TID) | ORAL | Status: DC | PRN
  Administered 2012-06-07: 0.5 mg via ORAL
  Filled 2012-06-07: qty 1

## 2012-06-07 MED ORDER — ONDANSETRON HCL 4 MG PO TABS: 4.0000 mg | ORAL_TABLET | Freq: Four times a day (QID) | ORAL | Status: DC | PRN

## 2012-06-07 MED ORDER — ALBUTEROL SULFATE (5 MG/ML) 0.5% IN NEBU
2.5000 mg | INHALATION_SOLUTION | Freq: Once | RESPIRATORY_TRACT | Status: AC
  Administered 2012-06-07: 2.5 mg via RESPIRATORY_TRACT
  Filled 2012-06-07: qty 1

## 2012-06-07 MED ORDER — SODIUM CHLORIDE 0.9 % IJ SOLN
3.0000 mL | Freq: Two times a day (BID) | INTRAMUSCULAR | Status: DC
  Administered 2012-06-08: 3 mL via INTRAVENOUS
  Filled 2012-06-07: qty 3

## 2012-06-07 MED ORDER — LEVOFLOXACIN IN D5W 500 MG/100ML IV SOLN
INTRAVENOUS | Status: AC
  Filled 2012-06-07: qty 100

## 2012-06-07 MED ORDER — GUAIFENESIN-DM 100-10 MG/5ML PO SYRP
5.0000 mL | ORAL_SOLUTION | ORAL | Status: DC | PRN
  Administered 2012-06-07: 5 mL via ORAL
  Filled 2012-06-07: qty 5

## 2012-06-07 MED ORDER — ATORVASTATIN CALCIUM 20 MG PO TABS
20.0000 mg | ORAL_TABLET | Freq: Every morning | ORAL | Status: DC
  Administered 2012-06-08: 20 mg via ORAL
  Filled 2012-06-07: qty 1

## 2012-06-07 MED ORDER — ACETAMINOPHEN 650 MG RE SUPP: 650.0000 mg | Freq: Four times a day (QID) | RECTAL | Status: DC | PRN

## 2012-06-07 MED ORDER — OXYCODONE-ACETAMINOPHEN 5-325 MG PO TABS
1.0000 | ORAL_TABLET | Freq: Once | ORAL | Status: AC
  Administered 2012-06-07: 1 via ORAL
  Filled 2012-06-07: qty 1

## 2012-06-07 MED ORDER — OXYCODONE-ACETAMINOPHEN 5-325 MG PO TABS
1.0000 | ORAL_TABLET | Freq: Four times a day (QID) | ORAL | Status: DC | PRN
  Administered 2012-06-08 (×3): 1 via ORAL
  Filled 2012-06-07 (×2): qty 1

## 2012-06-07 MED ORDER — ALBUTEROL SULFATE (5 MG/ML) 0.5% IN NEBU: 2.5000 mg | INHALATION_SOLUTION | RESPIRATORY_TRACT | Status: DC | PRN

## 2012-06-07 MED ORDER — IPRATROPIUM BROMIDE 0.02 % IN SOLN
0.5000 mg | Freq: Once | RESPIRATORY_TRACT | Status: AC
  Administered 2012-06-07: 0.5 mg via RESPIRATORY_TRACT
  Filled 2012-06-07: qty 2.5

## 2012-06-07 MED ORDER — ALBUTEROL SULFATE (5 MG/ML) 0.5% IN NEBU
10.0000 mg | INHALATION_SOLUTION | Freq: Once | RESPIRATORY_TRACT | Status: AC
  Administered 2012-06-07: 10 mg via RESPIRATORY_TRACT
  Filled 2012-06-07: qty 2

## 2012-06-07 MED ORDER — LEVOFLOXACIN IN D5W 500 MG/100ML IV SOLN
500.0000 mg | Freq: Every day | INTRAVENOUS | Status: DC
  Administered 2012-06-07: 500 mg via INTRAVENOUS
  Filled 2012-06-07 (×2): qty 100

## 2012-06-07 MED ORDER — AMLODIPINE BESYLATE 5 MG PO TABS
10.0000 mg | ORAL_TABLET | Freq: Every morning | ORAL | Status: DC
  Administered 2012-06-08: 10 mg via ORAL
  Filled 2012-06-07: qty 2

## 2012-06-07 MED ORDER — SODIUM CHLORIDE 0.9 % IV BOLUS (SEPSIS)
1000.0000 mL | Freq: Once | INTRAVENOUS | Status: AC
  Administered 2012-06-07: 1000 mL via INTRAVENOUS

## 2012-06-07 MED ORDER — ALBUTEROL SULFATE (5 MG/ML) 0.5% IN NEBU
2.5000 mg | INHALATION_SOLUTION | RESPIRATORY_TRACT | Status: DC
  Administered 2012-06-07 – 2012-06-08 (×5): 2.5 mg via RESPIRATORY_TRACT
  Filled 2012-06-07 (×5): qty 0.5

## 2012-06-07 MED ORDER — ENOXAPARIN SODIUM 40 MG/0.4ML ~~LOC~~ SOLN
40.0000 mg | SUBCUTANEOUS | Status: DC
  Administered 2012-06-08: 40 mg via SUBCUTANEOUS
  Filled 2012-06-07: qty 0.4

## 2012-06-07 MED ORDER — IPRATROPIUM BROMIDE 0.02 % IN SOLN
0.5000 mg | RESPIRATORY_TRACT | Status: DC
  Administered 2012-06-07 – 2012-06-08 (×5): 0.5 mg via RESPIRATORY_TRACT
  Filled 2012-06-07 (×5): qty 2.5

## 2012-06-07 NOTE — ED Notes (Signed)
Continuous neb treatment completed. Pt  on room air. SAO2 93-92 %. Pt placed on 2 LPM [place]

## 2012-06-07 NOTE — ED Notes (Signed)
Pt reports sob all day today.  Pt has hx of asthma, and reports using her nebulizer and inhaler w/out relief.  Pt has expiratory wheezing and use of her accessory muscles in triage.

## 2012-06-07 NOTE — ED Provider Notes (Signed)
History     CSN: [id number]  Arrival date & time 06/07/12  4098   First MD Initiated Contact with Patient 06/07/12 1819      Chief Complaint  Patient presents with  . Shortness of Breath    (Consider location/radiation/quality/duration/timing/severity/associated sxs/prior treatment) Patient is a 41 y.o. female presenting with shortness of breath. The history is provided by the patient (the pt complains of wheezing and sob). No language interpreter was used.  Shortness of Breath  The current episode started today. The onset was sudden. The problem occurs continuously. The problem has been unchanged. The problem is severe. Nothing relieves the symptoms. Nothing aggravates the symptoms. Associated symptoms include shortness of breath. Pertinent negatives include no chest pain and no cough. She has had no prior steroid use. Her past medical history does not include eczema.    Past Medical History  Diagnosis Date  . Asthma   . Depression   . Migraine   . Pancreatitis chronic Idiopathic    Treated by Dr. Andrey Campanile at [facility] in the past with celiac blocks.  . Bipolar 1 disorder   . Hyperlipidemia 02/03/2012  . Diabetes mellitus   . COPD (chronic obstructive pulmonary disease)     Past Surgical History  Procedure Date  . Cholecystectomy   . Tonsillectomy   . Abdominal hysterectomy     Family History  Problem Relation Age of Onset  . Asthma Other   . Coronary artery disease Neg Hx     History  Substance Use Topics  . Smoking status: Former Smoker -- 1.0 packs/day for 15 years    Types: Cigarettes  . Smokeless tobacco: Former Neurosurgeon    Quit date: 03/05/2012  . Alcohol Use: No    OB History    Grav Para Term Preterm Abortions TAB SAB Ect Mult Living                  Review of Systems  Constitutional: Negative for fatigue.  HENT: Negative for congestion, sinus pressure and ear discharge.   Eyes: Negative for discharge.  Respiratory: Positive for shortness of  breath. Negative for cough.   Cardiovascular: Negative for chest pain.  Gastrointestinal: Negative for abdominal pain and diarrhea.  Genitourinary: Negative for frequency and hematuria.  Musculoskeletal: Negative for back pain.  Skin: Negative for rash.  Neurological: Negative for seizures and headaches.  Hematological: Negative.   Psychiatric/Behavioral: Negative for hallucinations.    Allergies  Sulfa antibiotics; Penicillins; and Prednisone  Home Medications   Current Outpatient Rx  Name  Route  Sig  Dispense  Refill  . ALBUTEROL SULFATE HFA 108 (90 BASE) MCG/ACT IN AERS   Inhalation   Inhale 2 puffs into the lungs daily as needed. For shortness of breath         . ALBUTEROL SULFATE (2.5 MG/3ML) 0.083% IN NEBU   Nebulization   Take 2.5 mg by nebulization every 6 (six) hours as needed. For shortness of breath          . AMLODIPINE BESYLATE 10 MG PO TABS   Oral   Take 10 mg by mouth every morning.          . ATORVASTATIN CALCIUM 20 MG PO TABS   Oral   Take 20 mg by mouth every morning.         Marland Kitchen CLONAZEPAM 0.5 MG PO TABS   Oral   Take 0.5 mg by mouth 3 (three) times daily as needed. For anxiety         .  DOCUSATE SODIUM 100 MG PO CAPS   Oral   Take 100 mg by mouth at bedtime.          . OMEPRAZOLE 20 MG PO CPDR   Oral   Take 20 mg by mouth 3 (three) times daily.           Marland Kitchen ONDANSETRON HCL 4 MG PO TABS   Oral   Take 4-8 mg by mouth every 6 (six) hours as needed. 1 or 2 po q6h prn nausea         . OXYCODONE-ACETAMINOPHEN 5-325 MG PO TABS   Oral   Take 1 tablet by mouth every 6 (six) hours as needed. For pain         . SERTRALINE HCL 25 MG PO TABS   Oral   Take 25 mg by mouth at bedtime.           BP 150/85  Pulse 124  Temp 98.2 F (36.8 C) (Oral)  Resp 26  SpO2 97%  Physical Exam  Constitutional: She is oriented to person, place, and time. She appears well-developed.  HENT:  Head: Normocephalic and atraumatic.  Eyes:  Conjunctivae normal and EOM are normal. No scleral icterus.  Neck: Neck supple. No thyromegaly present.  Cardiovascular: Normal rate and regular rhythm.  Exam reveals no gallop and no friction rub.   No murmur heard. Pulmonary/Chest: No stridor. She has wheezes. She has no rales. She exhibits no tenderness.       Severe wheezes  Abdominal: She exhibits no distension. There is no tenderness. There is no rebound.  Musculoskeletal: Normal range of motion. She exhibits no edema.  Lymphadenopathy:    She has no cervical adenopathy.  Neurological: She is oriented to person, place, and time. Coordination normal.  Skin: No rash noted. No erythema.  Psychiatric: She has a normal mood and affect. Her behavior is normal.    ED Course  Procedures (including critical care time)  Labs Reviewed  CBC WITH DIFFERENTIAL - Abnormal; Notable for the following:    WBC 11.6 (*)     Eosinophils Relative 11 (*)     Eosinophils Absolute 1.3 (*)     All other components within normal limits  COMPREHENSIVE METABOLIC PANEL - Abnormal; Notable for the following:    Glucose, Bld 132 (*)     All other components within normal limits  TROPONIN I   Dg Chest Portable 1 View  06/07/2012  *RADIOLOGY REPORT*  Clinical Data: Shortness of breath  PORTABLE CHEST - 1 VIEW  Comparison: 05/23/2012  Findings: Normal heart size.  No pleural effusion or edema.  Scar versus plate-like atelectasis noted in the left midlung.  No airspace consolidation.  IMPRESSION:  1.  Left midlung scar versus plate-like atelectasis.   Original Report Authenticated By: Signa Kell, M.D.     Date: 06/07/2012  Rate: 111  Rhythm: sinus tachycardia  QRS Axis: normal  Intervals: normal  ST/T Wave abnormalities: nonspecific T wave changes  Conduction Disutrbances:right bundle branch block  Narrative Interpretation:   Old EKG Reviewed: unchanged    1. Asthma    CRITICAL CARE Performed by: Lynlee Stratton L   Total critical care time:  40  Critical care time was exclusive of separately billable procedures and treating other patients.  Critical care was necessary to treat or prevent imminent or life-threatening deterioration.  Critical care was time spent personally by me on the following activities: development of treatment plan with patient and/or surrogate as well as nursing,  discussions with consultants, evaluation of patient's response to treatment, examination of patient, obtaining history from patient or surrogate, ordering and performing treatments and interventions, ordering and review of laboratory studies, ordering and review of radiographic studies, pulse oximetry and re-evaluation of patient's condition.    MDM          Benny Lennert, MD 06/07/12 2051

## 2012-06-07 NOTE — H&P (Addendum)
[facility] History and Physical  [name]:[id number] DOB: February 16, 1971 DOA: 06/07/2012   PCP: Cassell Smiles., MD  Specialists: Dr. Jena Gauss with Gastroenterology  Chief Complaint: Progressively worsening shortness of breath  HPI: [name] is a 41 y.o. female with a past medical history of asthma, hypertension, chronic pancreatitis, bipolar disorder, and migraine headaches, who was in her usual state of health about 2 weeks ago, when she started noticing shortness of breath and wheezing. The symptoms have been progressively getting worse. She decided to call her doctor only today, and a prescription for prednisone was called in. However, the patient was feeling so bad that she decided to come in to the hospital. She's been having a dry cough. Has been having chills, but no fever. Denies any chest pain. She did having nausea, but no vomiting. She been wheezing quite severely. She tells me that her grandson had a cough within the last 2 weeks. But does not know if the grandson had fever. Denies any travel outside this area. Gets leg swelling occasionally. She also tells me, that she's been having some left arm and finger numbness and tingling along with neck pain over the last couple of weeks. Denies any falls or trauma.  Home Medications: Prior to Admission medications   Medication Sig Start Date End Date Taking? Authorizing Provider  albuterol (PROVENTIL HFA;VENTOLIN HFA) 108 (90 BASE) MCG/ACT inhaler Inhale 2 puffs into the lungs daily as needed. For shortness of breath   Yes Historical Provider, MD  albuterol (PROVENTIL) (2.5 MG/3ML) 0.083% nebulizer solution Take 2.5 mg by nebulization every 6 (six) hours as needed. For shortness of breath    Yes Historical Provider, MD  amLODipine (NORVASC) 10 MG tablet Take 10 mg by mouth every morning.    Yes Historical Provider, MD  atorvastatin (LIPITOR) 20 MG tablet Take 20 mg by mouth every morning.   Yes Historical Provider, MD    clonazePAM (KLONOPIN) 0.5 MG tablet Take 0.5 mg by mouth 3 (three) times daily as needed. For anxiety   Yes Historical Provider, MD  docusate sodium (COLACE) 100 MG capsule Take 100 mg by mouth at bedtime.    Yes Historical Provider, MD  omeprazole (PRILOSEC) 20 MG capsule Take 20 mg by mouth 3 (three) times daily.     Yes Historical Provider, MD  ondansetron (ZOFRAN) 4 MG tablet Take 4-8 mg by mouth every 6 (six) hours as needed. 1 or 2 po q6h prn nausea 05/24/12  Yes Kathie Dike, PA  oxyCODONE-acetaminophen (PERCOCET/ROXICET) 5-325 MG per tablet Take 1 tablet by mouth every 6 (six) hours as needed. For pain   Yes Historical Provider, MD  sertraline (ZOLOFT) 25 MG tablet Take 25 mg by mouth at bedtime.   Yes Historical Provider, MD    Allergies:  Allergies  Allergen Reactions  . Sulfa Antibiotics Shortness Of Breath  . Penicillins     REACTION: Unknown reaction  . Prednisone     pancreatitis    Past Medical History: Past Medical History  Diagnosis Date  . Asthma   . Depression   . Migraine   . Pancreatitis chronic Idiopathic    Treated by Dr. Andrey Campanile at [facility] in the past with celiac blocks.  . Bipolar 1 disorder   . Hyperlipidemia 02/03/2012  . Diabetes mellitus   . COPD (chronic obstructive pulmonary disease)     Past Surgical History  Procedure Date  . Cholecystectomy   . Tonsillectomy   . Abdominal hysterectomy  Social History:  reports that she has been smoking Cigarettes.  She has a 3.75 pack-year smoking history. She quit smokeless tobacco use about 3 months ago. She reports that she does not drink alcohol or use illicit drugs.  Living Situation: Lives with her husband in [place] Activity Level: Independent with her daily activities   Family History:  Family History  Problem Relation Age of Onset  . Asthma Other   . Coronary artery disease Neg Hx      Review of Systems - History obtained from the patient General ROS: positive  for  - fatigue Psychological ROS: positive for - anxiety Ophthalmic ROS: negative ENT ROS: negative Allergy and Immunology ROS: negative Hematological and Lymphatic ROS: negative Endocrine ROS: negative Respiratory ROS: as in hpi Cardiovascular ROS: no chest pain or dyspnea on exertion Gastrointestinal ROS: no abdominal pain, change in bowel habits, or black or bloody stools Genito-Urinary ROS: no dysuria, trouble voiding, or hematuria Musculoskeletal ROS: positive for - pain in neck - left Neurological ROS: positive for - numbness/tingling in left hand/fingers Dermatological ROS: negative  Physical Examination  Filed Vitals:   06/07/12 1819 06/07/12 1835 06/07/12 1914 06/07/12 2042  BP: 150/85     Pulse: 124     Temp: 98.2 F (36.8 C)     TempSrc: Oral     Resp: 26     SpO2: 84% 88% 93% 97%    General appearance: alert, cooperative, appears stated age, fatigued and no distress Head: Normocephalic, without obvious abnormality, atraumatic Eyes: conjunctivae/corneas clear. PERRL, EOM's intact.  Throat: lips, mucosa, and tongue normal; teeth and gums normal Neck: There is some restriction in range of motion especially with rotation. She had some pain in the left side of her neck when she turned her neck in that direction. No obvious deformity is noted. Back: symmetric, no curvature. ROM normal. No CVA tenderness. Resp: Diffuse end expiratory wheezing is heard bilaterally. Few crackles at the bases. Cardio: regular rate and rhythm, S1, S2 normal, no murmur, click, rub or gallop GI: soft, non-tender; bowel sounds normal; no masses,  no organomegaly Extremities: extremities normal, atraumatic, no cyanosis or edema Pulses: 2+ and symmetric Skin: Skin color, texture, turgor normal. No rashes or lesions Lymph nodes: Cervical, supraclavicular, and axillary nodes normal. Neurologic: She is alert and oriented x3. Cranial nerves are intact. Strength is equal in bilateral upper  extremities. No obvious sensory deficits were noted. No focal neurological deficits appreciated.  Laboratory Data: Results for orders placed during the hospital encounter of 06/07/12 (from the past 48 hour(s))  CBC WITH DIFFERENTIAL     Status: Abnormal   Collection Time   06/07/12  6:31 PM      Component Value Range Comment   WBC 11.6 (*) 4.0 - 10.5 K/uL    RBC 4.52  3.87 - 5.11 MIL/uL    Hemoglobin 14.1  12.0 - 15.0 g/dL    HCT 16.1  09.6 - 04.5 %    MCV 90.7  78.0 - 100.0 fL    MCH 31.2  26.0 - 34.0 pg    MCHC 34.4  30.0 - 36.0 g/dL    RDW 40.9  81.1 - 91.4 %    Platelets 210  150 - 400 K/uL    Neutrophils Relative 61  43 - 77 %    Neutro Abs 7.1  1.7 - 7.7 K/uL    Lymphocytes Relative 23  12 - 46 %    Lymphs Abs 2.7  0.7 -  4.0 K/uL    Monocytes Relative 5  3 - 12 %    Monocytes Absolute 0.6  0.1 - 1.0 K/uL    Eosinophils Relative 11 (*) 0 - 5 %    Eosinophils Absolute 1.3 (*) 0.0 - 0.7 K/uL    Basophils Relative 0  0 - 1 %    Basophils Absolute 0.1  0.0 - 0.1 K/uL   COMPREHENSIVE METABOLIC PANEL     Status: Abnormal   Collection Time   06/07/12  6:31 PM      Component Value Range Comment   Sodium 137  135 - 145 mEq/L    Potassium 3.8  3.5 - 5.1 mEq/L    Chloride 101  96 - 112 mEq/L    CO2 26  19 - 32 mEq/L    Glucose, Bld 132 (*) 70 - 99 mg/dL    BUN 11  6 - 23 mg/dL    Creatinine, Ser 4.54  0.50 - 1.10 mg/dL    Calcium 9.8  8.4 - 09.8 mg/dL    Total Protein 7.2  6.0 - 8.3 g/dL    Albumin 4.1  3.5 - 5.2 g/dL    AST 19  0 - 37 U/L    ALT 19  0 - 35 U/L    Alkaline Phosphatase 86  39 - 117 U/L    Total Bilirubin 0.3  0.3 - 1.2 mg/dL    GFR calc non Af Amer >90  >90 mL/min    GFR calc Af Amer >90  >90 mL/min   TROPONIN I     Status: Normal   Collection Time   06/07/12  6:31 PM      Component Value Range Comment   Troponin I <0.30  <0.30 ng/mL     Radiology Reports: Dg Chest Portable 1 View  06/07/2012  *RADIOLOGY REPORT*  Clinical Data: Shortness of breath   PORTABLE CHEST - 1 VIEW  Comparison: 05/23/2012  Findings: Normal heart size.  No pleural effusion or edema.  Scar versus plate-like atelectasis noted in the left midlung.  No airspace consolidation.  IMPRESSION:  1.  Left midlung scar versus plate-like atelectasis.   Original Report Authenticated By: Signa Kell, M.D.     Assessment/Plan  Principal Problem:  *Asthma exacerbation Active Problems:  Bipolar disorder  MIGRAINE HEADACHE  HYPERTENSION  Acute respiratory failure with hypoxia   #1 Acute respiratory failure with hypoxia, secondary to acute asthma exacerbation: Because she smokes cigarettes she may have an element of COPD, as well. She'll be admitted to the hospital and treated with steroids, nebulizer treatments, and antibiotics. Oxygen will be provided. Her sats were in the 80s, when she presented to the hospital. She was advised to stop smoking.  #2 neck pain with left arm tingling in the absense of injury/fall: Examination revealed some neck tenderness and decreased range of motion. She does not have any focal neurological deficits. And she gets the tingling sensation once in a while. This is mostly because of cervical radiculopathy. CT head will be obtained to rule out intracranial abnormalities though likelihood is low. Have asked her to discuss this issue further with her PCP.  #3 hypertension: Continue with amlodipine.  #4 history of bipolar disorder: Continue with her home medications.  DVT, prophylaxis with enoxaparin  Code Status: She is a full code Family Communication: No family at bedside  Disposition Plan: We'll likely go back home and ready for discharge   [facility]  [facility] Pager [phone]  If 7PM-7AM,  please contact night-coverage [email or web address] Password [email or web address]  06/07/2012, 9:05 PM

## 2012-06-08 ENCOUNTER — Encounter (HOSPITAL_COMMUNITY): Payer: Self-pay | Admitting: Internal Medicine

## 2012-06-08 ENCOUNTER — Observation Stay (HOSPITAL_COMMUNITY): Payer: Medicaid Other

## 2012-06-08 DIAGNOSIS — T50904A Poisoning by unspecified drugs, medicaments and biological substances, undetermined, initial encounter: Secondary | ICD-10-CM

## 2012-06-08 DIAGNOSIS — T50905A Adverse effect of unspecified drugs, medicaments and biological substances, initial encounter: Secondary | ICD-10-CM | POA: Diagnosis present

## 2012-06-08 DIAGNOSIS — M542 Cervicalgia: Secondary | ICD-10-CM

## 2012-06-08 DIAGNOSIS — R739 Hyperglycemia, unspecified: Secondary | ICD-10-CM | POA: Diagnosis present

## 2012-06-08 DIAGNOSIS — R200 Anesthesia of skin: Secondary | ICD-10-CM | POA: Diagnosis present

## 2012-06-08 DIAGNOSIS — R7309 Other abnormal glucose: Secondary | ICD-10-CM

## 2012-06-08 HISTORY — DX: Cervicalgia: M54.2

## 2012-06-08 LAB — CBC
Hemoglobin: 13.4 g/dL (ref 12.0–15.0)
MCH: 31 pg (ref 26.0–34.0)
MCHC: 34 g/dL (ref 30.0–36.0)
RBC: 4.32 MIL/uL (ref 3.87–5.11)
RDW: 12 % (ref 11.5–15.5)

## 2012-06-08 LAB — COMPREHENSIVE METABOLIC PANEL
ALT: 18 U/L (ref 0–35)
AST: 19 U/L (ref 0–37)
Alkaline Phosphatase: 80 U/L (ref 39–117)
CO2: 20 mEq/L (ref 19–32)
Calcium: 8.7 mg/dL (ref 8.4–10.5)
Chloride: 103 mEq/L (ref 96–112)
GFR calc Af Amer: >90 mL/min (ref >90)
GFR calc non Af Amer: >90 mL/min (ref >90)
Glucose, Bld: 232 mg/dL — ABNORMAL HIGH (ref 70–99)
Potassium: 3.7 mEq/L (ref 3.5–5.1)
Sodium: 137 mEq/L (ref 135–145)

## 2012-06-08 LAB — HEMOGLOBIN A1C
Hgb A1c MFr Bld: 6.2 % — ABNORMAL HIGH (ref <5.7)
Mean Plasma Glucose: 131 mg/dL — ABNORMAL HIGH (ref <117)

## 2012-06-08 LAB — GLUCOSE, CAPILLARY

## 2012-06-08 MED ORDER — INSULIN DETEMIR 100 UNIT/ML ~~LOC~~ SOLN
10.0000 [IU] | Freq: Two times a day (BID) | SUBCUTANEOUS | Status: DC
  Administered 2012-06-08: 10 [IU] via SUBCUTANEOUS
  Filled 2012-06-08: qty 10

## 2012-06-08 MED ORDER — PREDNISOLONE 5 MG PO TABS: ORAL_TABLET | ORAL | Status: DC

## 2012-06-08 MED ORDER — INSULIN ASPART 100 UNIT/ML ~~LOC~~ SOLN: 0.0000 [IU] | Freq: Every day | SUBCUTANEOUS | Status: DC

## 2012-06-08 MED ORDER — LEVOFLOXACIN 500 MG PO TABS: 500.0000 mg | ORAL_TABLET | Freq: Every day | ORAL | Status: DC

## 2012-06-08 MED ORDER — INSULIN ASPART 100 UNIT/ML ~~LOC~~ SOLN
0.0000 [IU] | Freq: Three times a day (TID) | SUBCUTANEOUS | Status: DC
  Administered 2012-06-08: 4 [IU] via SUBCUTANEOUS

## 2012-06-08 MED ORDER — OXYCODONE-ACETAMINOPHEN 5-325 MG PO TABS
ORAL_TABLET | ORAL | Status: AC
  Administered 2012-06-08: 1 via ORAL
  Filled 2012-06-08: qty 1

## 2012-06-08 MED ORDER — OXYCODONE-ACETAMINOPHEN 5-325 MG PO TABS: 1.0000 | ORAL_TABLET | Freq: Four times a day (QID) | ORAL | Status: DC | PRN

## 2012-06-08 NOTE — Discharge Summary (Signed)
Physician Discharge Summary  [name] ZOX:[id number] DOB: 08-Apr-1971 DOA: 06/07/2012  PCP: Cassell Smiles., MD  Admit date: 06/07/2012 Discharge date: 06/08/2012  Time spent: Greater than 30 minutes  Recommendations for Outpatient Follow-up:  1. The patient was instructed to followup with Dr. Sherwood Gambler or colleague in one week.  Discharge Diagnoses:  1. Asthma exacerbation. 2. Acute hypoxic respiratory failure, secondary to asthma exacerbation. Resolved. 3. Left-sided neck pain and associated left arm paresthesias/numbness. 4. Hyperglycemia, secondary to Solu-Medrol. 5. Chronic migraine headaches. 6. Hypertension. 7. Bipolar disorder. 8. Leukocytosis secondary to steroid.  Discharge Condition: Improved.  Diet recommendation: Heart healthy.  Filed Weights   06/07/12 2156  Weight: 78.382 kg (172 lb 12.8 oz)    History of present illness:  The patient is a 41 year old woman with a history of asthma, hypertension, migraine headaches, and bipolar disorder, who presented to the emergency department on 06/07/2012 with a chief complaint of progressive shortness of breath and wheezing. She also complained of left-sided neck pain with associated left arm numbness and tingling. In the emergency department, she was oxygenating between 84 and 97% on room air and then oxygen. Her lab data were significant for WBC of 11.6 and glucose of 132. Her troponin I was less than 0.30. Her chest x-ray revealed left mid lung scar versus plate like atelectasis. She was admitted for further evaluation and management.  Hospital Course:  The patient was started on oxygen, titrated to keep her oxygen saturations greater than 92%. For treatment of asthma exacerbation, query COPD, she was started on IV Solu-Medrol, IV Levaquin, and bronchodilator therapy with albuterol and Atrovent. She was strongly advised to stop smoking. Tobacco cessation counseling was ordered. She has no history of diabetes mellitus,  but her blood glucose became elevated on steroid therapy. Therefore, she was started on sliding scale NovoLog and Lantus. She complained of chronic migraine headaches. Her headaches were treated with analgesics as needed. CT of her head was ordered for further evaluation, and it revealed no acute intracranial findings. In addition, she complained of left-sided neck pain with associated left arm numbness. CT of her cervical spine was ordered.it revealed a disc bulge at L4-L5 without neural compression and degenerative spondylosis at C5-C6, without severe stenosis, but there could potentially be C6 nerve root irritation. The patient was informed of this. Prior to discharge, she was prescribed Percocet as needed for pain and a prednisolone taper for not only continued treatment of asthma exacerbation but also for degenerative changes seen in her cervical spine. It is likely that the degenerative changes seen in her cervical spine has led to intermittent radicular-like symptomatology. She was instructed to discuss further management and referrals with her primary care physician as needed.  Over the course of the hospitalization, the patient's pulmonary status improved. Her oxygen saturation improved to the mid-90s on room air. She was bronchospasms free at the time of discharge. She was discharged on continued bronchodilator therapy, prednisone taper, and Levaquin.  Procedures:  None  Consultations:  None  Discharge Exam: Filed Vitals:   06/08/12 0354 06/08/12 0757 06/08/12 1132 06/08/12 1350  BP: 149/84   125/77  Pulse: 121   110  Temp: 98 F (36.7 C)   98 F (36.7 C)  TempSrc: Oral   Oral  Resp: 18   18  Height:      Weight:      SpO2: 95% 96% 96% 95%    General: No acute distress Cardiovascular: S1, S2, with no murmurs rubs gallops. Respiratory:  Clear to auscultation bilaterally. Musculoskeletal: Mild tenderness over the paracervical muscles on the left. No tenderness over the left  shoulder girdle. Neurologic: Gait is within normal limits. Sensation is grossly within normal limits of the upper extremities bilaterally and symmetrically. Strength is grossly intact bilaterally.  Discharge Instructions      Discharge Orders    Future Orders Please Complete By Expires   Diet - low sodium heart healthy      Increase activity slowly          Medication List     As of 06/08/2012  6:52 PM    TAKE these medications         albuterol 108 (90 BASE) MCG/ACT inhaler   Commonly known as: PROVENTIL HFA;VENTOLIN HFA   Inhale 2 puffs into the lungs daily as needed. For shortness of breath      albuterol (2.5 MG/3ML) 0.083% nebulizer solution   Commonly known as: PROVENTIL   Take 2.5 mg by nebulization every 6 (six) hours as needed. For shortness of breath      amLODipine 10 MG tablet   Commonly known as: NORVASC   Take 10 mg by mouth every morning.      atorvastatin 20 MG tablet   Commonly known as: LIPITOR   Take 20 mg by mouth every morning.      clonazePAM 0.5 MG tablet   Commonly known as: KLONOPIN   Take 0.5 mg by mouth 3 (three) times daily as needed. For anxiety      docusate sodium 100 MG capsule   Commonly known as: COLACE   Take 100 mg by mouth at bedtime.      levofloxacin 500 MG tablet   Commonly known as: LEVAQUIN   Take 1 tablet (500 mg total) by mouth daily. Antibiotic.      omeprazole 20 MG capsule   Commonly known as: PRILOSEC   Take 20 mg by mouth 3 (three) times daily.      ondansetron 4 MG tablet   Commonly known as: ZOFRAN   Take 4-8 mg by mouth every 6 (six) hours as needed. 1 or 2 po q6h prn nausea      oxyCODONE-acetaminophen 5-325 MG per tablet   Commonly known as: PERCOCET/ROXICET   Take 1 tablet by mouth every 6 (six) hours as needed. For pain      prednisoLONE 5 MG Tabs   Starting tomorrow, take 4 tablets twice a day for one day; then 3 tablets twice a day for one day; then 2 tablets twice a day for one day; then 1 tablet  twice a day for one day; then stop.      sertraline 25 MG tablet   Commonly known as: ZOLOFT   Take 25 mg by mouth at bedtime.        Follow-up Information    Follow up with Colette Ribas, MD. In 1 week.   Contact information:   [address] [place] [address] [phone]           The results of significant diagnostics from this hospitalization (including imaging, microbiology, ancillary and laboratory) are listed below for reference.    Significant Diagnostic Studies: Ct Head Wo Contrast  06/07/2012  *RADIOLOGY REPORT*  Clinical Data: Left hand numbness.  CT HEAD WITHOUT CONTRAST  Technique:  Contiguous axial images were obtained from the base of the skull through the vertex without contrast.  Comparison: 02/19/2008  Findings: No acute intracranial abnormality.  Specifically, no hemorrhage, hydrocephalus, mass lesion, acute infarction, or significant intracranial injury.  No acute calvarial abnormality. Visualized paranasal sinuses and mastoids clear.  Orbital soft tissues unremarkable.  IMPRESSION: Normal study.   Original Report Authenticated By: Charlett Nose, M.D.    Dg Chest Portable 1 View  06/07/2012  *RADIOLOGY REPORT*  Clinical Data: Shortness of breath  PORTABLE CHEST - 1 VIEW  Comparison: 05/23/2012  Findings: Normal heart size.  No pleural effusion or edema.  Scar versus plate-like atelectasis noted in the left midlung.  No airspace consolidation.  IMPRESSION:  1.  Left midlung scar versus plate-like atelectasis.   Original Report Authenticated By: Signa Kell, M.D.    Dg Abd Acute W/chest  05/23/2012  *RADIOLOGY REPORT*  Clinical Data: Nausea.  Back pain.  Cough and wheezing.  ACUTE ABDOMEN SERIES (ABDOMEN 2 VIEW & CHEST 1 VIEW)  Comparison: One-view chest 04/04/2012.  Findings: The heart size is normal.  The lungs are clear.  A small moderate hiatal hernia is present.  Supine and upright views the abdomen demonstrate gaseous  distention of small bowel the mid abdomen without a fluid level.  Moderate stool is present throughout the colon.  There is no evidence for obstruction or free air.  Surgical clips are present in the gallbladder fossa.  IMPRESSION:  1.  Focal gaseous distention of small bowel in the midabdomen may represent an ileus. 2.  Moderate stool throughout the colon without evidence for obstruction. 3.  Small to moderate sized hiatal hernia.   Original Report Authenticated By: Jamesetta Orleans. MATTERN, M.D.     Microbiology: No results found for this or any previous visit (from the past 240 hour(s)).   Labs: Basic Metabolic Panel:  Lab 06/08/12 1610 06/07/12 1831  NA 137 137  K 3.7 3.8  CL 103 101  CO2 20 26  GLUCOSE 232* 132*  BUN 10 11  CREATININE 0.50 0.59  CALCIUM 8.7 9.8  MG -- --  PHOS -- --   Liver Function Tests:  Lab 06/08/12 0441 06/07/12 1831  AST 19 19  ALT 18 19  ALKPHOS 80 86  BILITOT 0.3 0.3  PROT 6.5 7.2  ALBUMIN 3.4* 4.1   No results found for this basename: LIPASE:5,AMYLASE:5 in the last 168 hours No results found for this basename: AMMONIA:5 in the last 168 hours CBC:  Lab 06/08/12 0441 06/07/12 1831  WBC 13.0* 11.6*  NEUTROABS -- 7.1  HGB 13.4 14.1  HCT 39.4 41.0  MCV 91.2 90.7  PLT 223 210   Cardiac Enzymes:  Lab 06/07/12 1831  CKTOTAL --  CKMB --  CKMBINDEX --  TROPONINI <0.30   BNP: BNP (last 3 results) No results found for this basename: PROBNP:3 in the last 8760 hours CBG:  Lab 06/08/12 1131  GLUCAP 199*       Signed:  Lametria Klunk  [facility] 06/08/2012, 6:52 PM

## 2012-06-08 NOTE — Progress Notes (Signed)
Pt being discharged home today. IV is out and assessment is unchanged from this morning. Pt is experiencing no dyspnea or SOB and is in NAD. Discharge information has been gone over with patient and all questions/concerns have been addressed. Pt is being accompanied downstairs by staff and family. All belongings are with patient.

## 2012-06-08 NOTE — Progress Notes (Signed)
UR Chart Review Completed  

## 2012-06-27 ENCOUNTER — Other Ambulatory Visit (HOSPITAL_COMMUNITY): Payer: Self-pay | Admitting: Internal Medicine

## 2012-06-27 DIAGNOSIS — M549 Dorsalgia, unspecified: Secondary | ICD-10-CM

## 2012-06-29 ENCOUNTER — Other Ambulatory Visit (HOSPITAL_COMMUNITY): Payer: Self-pay | Admitting: Family Medicine

## 2012-06-29 ENCOUNTER — Ambulatory Visit (HOSPITAL_COMMUNITY): Admission: RE | Admit: 2012-06-29 | Payer: Medicare Other | Source: Ambulatory Visit

## 2012-06-29 DIAGNOSIS — M542 Cervicalgia: Secondary | ICD-10-CM

## 2012-07-01 ENCOUNTER — Ambulatory Visit (HOSPITAL_COMMUNITY)
Admission: RE | Admit: 2012-07-01 | Discharge: 2012-07-01 | Disposition: A | Discharge status: Home / Self Care | Payer: Medicare Other | Source: Ambulatory Visit | Attending: Family Medicine | Admitting: Family Medicine

## 2012-07-01 DIAGNOSIS — M542 Cervicalgia: Secondary | ICD-10-CM | POA: Insufficient documentation

## 2012-07-01 DIAGNOSIS — M47812 Spondylosis without myelopathy or radiculopathy, cervical region: Secondary | ICD-10-CM | POA: Diagnosis not present

## 2012-07-01 DIAGNOSIS — M4802 Spinal stenosis, cervical region: Secondary | ICD-10-CM | POA: Diagnosis not present

## 2012-07-01 DIAGNOSIS — M5030 Other cervical disc degeneration, unspecified cervical region: Secondary | ICD-10-CM | POA: Diagnosis not present

## 2012-07-05 DIAGNOSIS — IMO0002 Reserved for concepts with insufficient information to code with codable children: Secondary | ICD-10-CM | POA: Diagnosis not present

## 2012-07-05 DIAGNOSIS — M47817 Spondylosis without myelopathy or radiculopathy, lumbosacral region: Secondary | ICD-10-CM | POA: Diagnosis not present

## 2012-07-06 DIAGNOSIS — IMO0002 Reserved for concepts with insufficient information to code with codable children: Secondary | ICD-10-CM | POA: Diagnosis not present

## 2012-07-06 DIAGNOSIS — M47817 Spondylosis without myelopathy or radiculopathy, lumbosacral region: Secondary | ICD-10-CM | POA: Diagnosis not present

## 2012-07-21 ENCOUNTER — Other Ambulatory Visit: Payer: Self-pay | Admitting: Neurosurgery

## 2012-07-21 DIAGNOSIS — S338XXA Sprain of other parts of lumbar spine and pelvis, initial encounter: Secondary | ICD-10-CM | POA: Diagnosis not present

## 2012-07-21 DIAGNOSIS — M542 Cervicalgia: Secondary | ICD-10-CM

## 2012-07-21 DIAGNOSIS — M549 Dorsalgia, unspecified: Secondary | ICD-10-CM

## 2012-07-21 DIAGNOSIS — M546 Pain in thoracic spine: Secondary | ICD-10-CM | POA: Diagnosis not present

## 2012-07-21 DIAGNOSIS — M545 Low back pain: Secondary | ICD-10-CM | POA: Diagnosis not present

## 2012-07-26 ENCOUNTER — Emergency Department (HOSPITAL_COMMUNITY): Payer: Medicare Other

## 2012-07-26 ENCOUNTER — Encounter (HOSPITAL_COMMUNITY): Payer: Self-pay | Admitting: *Deleted

## 2012-07-26 ENCOUNTER — Emergency Department (HOSPITAL_COMMUNITY)
Admission: EM | Admit: 2012-07-26 | Discharge: 2012-07-26 | Disposition: A | Discharge status: Home / Self Care | Payer: Medicare Other | Attending: Emergency Medicine | Admitting: Emergency Medicine

## 2012-07-26 DIAGNOSIS — J449 Chronic obstructive pulmonary disease, unspecified: Secondary | ICD-10-CM | POA: Insufficient documentation

## 2012-07-26 DIAGNOSIS — Z79899 Other long term (current) drug therapy: Secondary | ICD-10-CM | POA: Insufficient documentation

## 2012-07-26 DIAGNOSIS — R1013 Epigastric pain: Secondary | ICD-10-CM | POA: Diagnosis not present

## 2012-07-26 DIAGNOSIS — K5900 Constipation, unspecified: Secondary | ICD-10-CM | POA: Insufficient documentation

## 2012-07-26 DIAGNOSIS — E119 Type 2 diabetes mellitus without complications: Secondary | ICD-10-CM | POA: Diagnosis not present

## 2012-07-26 DIAGNOSIS — F319 Bipolar disorder, unspecified: Secondary | ICD-10-CM | POA: Insufficient documentation

## 2012-07-26 DIAGNOSIS — F17200 Nicotine dependence, unspecified, uncomplicated: Secondary | ICD-10-CM | POA: Insufficient documentation

## 2012-07-26 DIAGNOSIS — R109 Unspecified abdominal pain: Secondary | ICD-10-CM

## 2012-07-26 DIAGNOSIS — R112 Nausea with vomiting, unspecified: Secondary | ICD-10-CM | POA: Diagnosis not present

## 2012-07-26 DIAGNOSIS — J4489 Other specified chronic obstructive pulmonary disease: Secondary | ICD-10-CM | POA: Insufficient documentation

## 2012-07-26 DIAGNOSIS — K859 Acute pancreatitis without necrosis or infection, unspecified: Secondary | ICD-10-CM | POA: Diagnosis not present

## 2012-07-26 DIAGNOSIS — F3289 Other specified depressive episodes: Secondary | ICD-10-CM | POA: Insufficient documentation

## 2012-07-26 DIAGNOSIS — Z8679 Personal history of other diseases of the circulatory system: Secondary | ICD-10-CM | POA: Diagnosis not present

## 2012-07-26 DIAGNOSIS — E785 Hyperlipidemia, unspecified: Secondary | ICD-10-CM | POA: Diagnosis not present

## 2012-07-26 DIAGNOSIS — Z90710 Acquired absence of both cervix and uterus: Secondary | ICD-10-CM | POA: Diagnosis not present

## 2012-07-26 DIAGNOSIS — J45909 Unspecified asthma, uncomplicated: Secondary | ICD-10-CM | POA: Diagnosis not present

## 2012-07-26 DIAGNOSIS — F329 Major depressive disorder, single episode, unspecified: Secondary | ICD-10-CM | POA: Insufficient documentation

## 2012-07-26 LAB — COMPREHENSIVE METABOLIC PANEL
Albumin: 4.2 g/dL (ref 3.5–5.2)
BUN: 13 mg/dL (ref 6–23)
Chloride: 101 mEq/L (ref 96–112)
Creatinine, Ser: 0.71 mg/dL (ref 0.50–1.10)
GFR calc Af Amer: >90 mL/min (ref >90)
GFR calc non Af Amer: >90 mL/min (ref >90)
Sodium: 137 mEq/L (ref 135–145)
Total Bilirubin: 0.3 mg/dL (ref 0.3–1.2)

## 2012-07-26 LAB — CBC WITH DIFFERENTIAL/PLATELET
Basophils Relative: 0 % (ref 0–1)
Eosinophils Absolute: 0.4 10*3/uL (ref 0.0–0.7)
HCT: 38.3 % (ref 36.0–46.0)
Hemoglobin: 13.4 g/dL (ref 12.0–15.0)
MCH: 31.7 pg (ref 26.0–34.0)
MCHC: 35 g/dL (ref 30.0–36.0)
Monocytes Absolute: 0.7 10*3/uL (ref 0.1–1.0)
Monocytes Relative: 8 % (ref 3–12)
Neutro Abs: 5.7 10*3/uL (ref 1.7–7.7)

## 2012-07-26 LAB — LIPASE, BLOOD: Lipase: 31 U/L (ref 11–59)

## 2012-07-26 MED ORDER — METOCLOPRAMIDE HCL 5 MG/ML IJ SOLN
10.0000 mg | Freq: Once | INTRAMUSCULAR | Status: AC
  Administered 2012-07-26: 10 mg via INTRAVENOUS
  Filled 2012-07-26: qty 2

## 2012-07-26 MED ORDER — OXYCODONE-ACETAMINOPHEN 5-325 MG PO TABS: 1.0000 | ORAL_TABLET | Freq: Four times a day (QID) | ORAL | Status: AC | PRN

## 2012-07-26 MED ORDER — HYDROMORPHONE HCL PF 1 MG/ML IJ SOLN
1.0000 mg | Freq: Once | INTRAMUSCULAR | Status: AC
  Administered 2012-07-26: 1 mg via INTRAVENOUS
  Filled 2012-07-26: qty 1

## 2012-07-26 MED ORDER — ONDANSETRON HCL 4 MG/2ML IJ SOLN
4.0000 mg | Freq: Once | INTRAMUSCULAR | Status: AC
  Administered 2012-07-26: 4 mg via INTRAVENOUS
  Filled 2012-07-26: qty 2

## 2012-07-26 MED ORDER — SODIUM CHLORIDE 0.9 % IV BOLUS (SEPSIS)
1000.0000 mL | Freq: Once | INTRAVENOUS | Status: AC
  Administered 2012-07-26: 1000 mL via INTRAVENOUS

## 2012-07-26 MED ORDER — KETOROLAC TROMETHAMINE 30 MG/ML IJ SOLN
30.0000 mg | Freq: Once | INTRAMUSCULAR | Status: AC
  Administered 2012-07-26: 30 mg via INTRAVENOUS
  Filled 2012-07-26: qty 1

## 2012-07-26 MED ORDER — PROMETHAZINE HCL 25 MG PO TABS: 25.0000 mg | ORAL_TABLET | Freq: Four times a day (QID) | ORAL | Status: DC | PRN

## 2012-07-26 NOTE — ED Notes (Signed)
Pt c/o epigastric pain and nausea since this morning. Pt states she woke up this morning with symptoms and has also had 2 episodes of vomiting throughout day. Pt has hx of pancreatitis. Pt denies ETOH.

## 2012-07-26 NOTE — ED Notes (Signed)
Pt reports no relief from pain medication given previously.  MD aware. Pt also reports continued nausea.

## 2012-07-26 NOTE — ED Notes (Signed)
Pt has chronic pancreatitis, denies ETOH, stated woke up this morning with N/V and abdominal pain

## 2012-07-26 NOTE — ED Provider Notes (Signed)
History     CSN: [id number]  Arrival date & time 07/26/12  1800   First MD Initiated Contact with Patient 07/26/12 1816      Chief Complaint  Patient presents with  . Pancreatitis  . Abdominal Pain  . Nausea  . Emesis    (Consider location/radiation/quality/duration/timing/severity/associated sxs/prior treatment) Patient is a 41 y.o. female presenting with abdominal pain and vomiting. The history is provided by the patient (the pt complains of abd pain).  Abdominal Pain The primary symptoms of the illness include abdominal pain and vomiting. The primary symptoms of the illness do not include fatigue or diarrhea. The current episode started 13 to 24 hours ago. The onset of the illness was gradual. The problem has not changed since onset. Associated with: nothing. The patient states that she believes she is currently not pregnant. The patient has had a change in bowel habit. Risk factors: hx of pancreatitis. Symptoms associated with the illness do not include chills, hematuria, frequency or back pain.  Emesis  Associated symptoms include abdominal pain. Pertinent negatives include no chills, no cough, no diarrhea and no headaches.    Past Medical History  Diagnosis Date  . Asthma   . Depression   . Migraine   . Pancreatitis chronic Idiopathic    Treated by Dr. Andrey Campanile at [facility] in the past with celiac blocks.  . Bipolar 1 disorder   . Hyperlipidemia 02/03/2012  . Diabetes mellitus   . COPD (chronic obstructive pulmonary disease)   . Neck pain 06/08/2012    Past Surgical History  Procedure Date  . Cholecystectomy   . Tonsillectomy   . Abdominal hysterectomy     Family History  Problem Relation Age of Onset  . Asthma Other   . Coronary artery disease Neg Hx     History  Substance Use Topics  . Smoking status: Current Every Day Smoker -- 0.2 packs/day for 15 years    Types: Cigarettes  . Smokeless tobacco: Former Neurosurgeon    Quit date: 03/05/2012  . Alcohol  Use: No    OB History    Grav Para Term Preterm Abortions TAB SAB Ect Mult Living                  Review of Systems  Constitutional: Negative for chills and fatigue.  HENT: Negative for congestion, sinus pressure and ear discharge.   Eyes: Negative for discharge.  Respiratory: Negative for cough.   Cardiovascular: Negative for chest pain.  Gastrointestinal: Positive for vomiting and abdominal pain. Negative for diarrhea.  Genitourinary: Negative for frequency and hematuria.  Musculoskeletal: Negative for back pain.  Skin: Negative for rash.  Neurological: Negative for seizures and headaches.  Hematological: Negative.   Psychiatric/Behavioral: Negative for hallucinations.    Allergies  Sulfa antibiotics; Penicillins; and Prednisone  Home Medications   Current Outpatient Rx  Name  Route  Sig  Dispense  Refill  . ALBUTEROL SULFATE HFA 108 (90 BASE) MCG/ACT IN AERS   Inhalation   Inhale 2 puffs into the lungs daily as needed. For shortness of breath         . ALBUTEROL SULFATE (2.5 MG/3ML) 0.083% IN NEBU   Nebulization   Take 2.5 mg by nebulization every 6 (six) hours as needed. For shortness of breath          . AMLODIPINE BESYLATE 10 MG PO TABS   Oral   Take 10 mg by mouth every morning.          Marland Kitchen  CLONAZEPAM 0.5 MG PO TABS   Oral   Take 0.5 mg by mouth 3 (three) times daily as needed. For anxiety         . DOCUSATE SODIUM 100 MG PO CAPS   Oral   Take 100 mg by mouth at bedtime.          . OMEPRAZOLE 20 MG PO CPDR   Oral   Take 20 mg by mouth 3 (three) times daily.           Marland Kitchen ONDANSETRON HCL 4 MG PO TABS   Oral   Take 4-8 mg by mouth every 6 (six) hours as needed. 1 or 2 po q6h prn nausea         . SERTRALINE HCL 25 MG PO TABS   Oral   Take 25 mg by mouth at bedtime.         . OXYCODONE-ACETAMINOPHEN 5-325 MG PO TABS   Oral   Take 1 tablet by mouth every 6 (six) hours as needed for pain.   10 tablet   0   . PROMETHAZINE HCL 25 MG PO  TABS   Oral   Take 1 tablet (25 mg total) by mouth every 6 (six) hours as needed for nausea.   15 tablet   0     BP 130/93  Pulse 120  Temp 97.8 F (36.6 C) (Oral)  Resp 22  Ht 5\' 2"  (1.575 m)  Wt 167 lb (75.751 kg)  BMI 30.54 kg/m2  SpO2 98%  Physical Exam  Constitutional: She is oriented to person, place, and time. She appears well-developed.  HENT:  Head: Normocephalic and atraumatic.  Eyes: Conjunctivae normal and EOM are normal. No scleral icterus.  Neck: Neck supple. No thyromegaly present.  Cardiovascular: Normal rate and regular rhythm.  Exam reveals no gallop and no friction rub.   No murmur heard. Pulmonary/Chest: No stridor. She has no wheezes. She has no rales. She exhibits no tenderness.  Abdominal: She exhibits no distension. There is tenderness. There is no rebound.  Musculoskeletal: Normal range of motion. She exhibits no edema.  Lymphadenopathy:    She has no cervical adenopathy.  Neurological: She is oriented to person, place, and time. Coordination normal.  Skin: No rash noted. No erythema.  Psychiatric: She has a normal mood and affect. Her behavior is normal.    ED Course  Procedures (including critical care time)  Labs Reviewed  COMPREHENSIVE METABOLIC PANEL - Abnormal; Notable for the following:    Glucose, Bld 153 (*)     ALT 41 (*)     All other components within normal limits  CBC WITH DIFFERENTIAL  LIPASE, BLOOD   Dg Abd Acute W/chest  07/26/2012  *RADIOLOGY REPORT*  Clinical Data: Abdominal pain.  Pancreatitis.  Asthma.  ACUTE ABDOMEN SERIES (ABDOMEN 2 VIEW & CHEST 1 VIEW)  Comparison: 05/23/2012  Findings: No evidence of dilated bowel loops. Surgical clips seen from prior cholecystectomy. No evidence of free air.  Large stool burden noted.  A small hiatal hernia again seen.  Heart size is normal.  Both lungs are clear.  IMPRESSION:  1.  No acute findings. 2.  Large stool burden noted. 3.  Stable small hiatal hernia.   Original Report  Authenticated By: Myles Rosenthal, M.D.      1. Constipation   2. Abdominal pain       MDM          Benny Lennert, MD 07/26/12 2030

## 2012-07-29 ENCOUNTER — Ambulatory Visit
Admission: RE | Admit: 2012-07-29 | Discharge: 2012-07-29 | Disposition: A | Discharge status: Home / Self Care | Payer: Medicare Other | Source: Ambulatory Visit | Attending: Neurosurgery | Admitting: Neurosurgery

## 2012-07-29 VITALS — BP 105/59 | HR 63

## 2012-07-29 DIAGNOSIS — S22009A Unspecified fracture of unspecified thoracic vertebra, initial encounter for closed fracture: Secondary | ICD-10-CM | POA: Diagnosis not present

## 2012-07-29 DIAGNOSIS — M546 Pain in thoracic spine: Secondary | ICD-10-CM | POA: Diagnosis not present

## 2012-07-29 DIAGNOSIS — M549 Dorsalgia, unspecified: Secondary | ICD-10-CM

## 2012-07-29 DIAGNOSIS — M542 Cervicalgia: Secondary | ICD-10-CM | POA: Diagnosis not present

## 2012-07-29 DIAGNOSIS — M545 Low back pain: Secondary | ICD-10-CM | POA: Diagnosis not present

## 2012-07-29 DIAGNOSIS — S199XXA Unspecified injury of neck, initial encounter: Secondary | ICD-10-CM | POA: Diagnosis not present

## 2012-07-29 DIAGNOSIS — S0993XA Unspecified injury of face, initial encounter: Secondary | ICD-10-CM | POA: Diagnosis not present

## 2012-07-29 DIAGNOSIS — IMO0002 Reserved for concepts with insufficient information to code with codable children: Secondary | ICD-10-CM | POA: Diagnosis not present

## 2012-07-29 MED ORDER — HYDROXYZINE HCL 50 MG/ML IM SOLN
25.0000 mg | Freq: Once | INTRAMUSCULAR | Status: AC
  Administered 2012-07-29: 25 mg via INTRAMUSCULAR

## 2012-07-29 MED ORDER — IOHEXOL 180 MG/ML  SOLN
15.0000 mL | Freq: Once | INTRAMUSCULAR | Status: AC | PRN
  Administered 2012-07-29: 15 mL via INTRATHECAL

## 2012-07-29 MED ORDER — ONDANSETRON HCL 4 MG/2ML IJ SOLN
4.0000 mg | Freq: Four times a day (QID) | INTRAMUSCULAR | Status: DC | PRN
  Administered 2012-07-29: 4 mg via INTRAVENOUS

## 2012-07-29 MED ORDER — HYDROMORPHONE HCL PF 2 MG/ML IJ SOLN
2.0000 mg | Freq: Once | INTRAMUSCULAR | Status: AC
  Administered 2012-07-29: 2 mg via INTRAMUSCULAR

## 2012-07-29 MED ORDER — DIAZEPAM 5 MG PO TABS
10.0000 mg | ORAL_TABLET | Freq: Once | ORAL | Status: AC
  Administered 2012-07-29: 10 mg via ORAL

## 2012-07-29 NOTE — Progress Notes (Signed)
Pt more comfortable at present, post pain meds.

## 2012-07-29 NOTE — Progress Notes (Signed)
Pt states she has been off sertraline for the past 2 days.

## 2012-08-03 DIAGNOSIS — M546 Pain in thoracic spine: Secondary | ICD-10-CM | POA: Diagnosis not present

## 2012-08-03 DIAGNOSIS — M542 Cervicalgia: Secondary | ICD-10-CM | POA: Diagnosis not present

## 2012-08-03 DIAGNOSIS — M545 Low back pain: Secondary | ICD-10-CM | POA: Diagnosis not present

## 2012-08-17 DIAGNOSIS — G8929 Other chronic pain: Secondary | ICD-10-CM | POA: Diagnosis not present

## 2012-08-17 DIAGNOSIS — Z6829 Body mass index (BMI) 29.0-29.9, adult: Secondary | ICD-10-CM | POA: Diagnosis not present

## 2012-08-17 DIAGNOSIS — J45909 Unspecified asthma, uncomplicated: Secondary | ICD-10-CM | POA: Diagnosis not present

## 2012-08-29 DIAGNOSIS — M546 Pain in thoracic spine: Secondary | ICD-10-CM | POA: Diagnosis not present

## 2012-08-29 DIAGNOSIS — M542 Cervicalgia: Secondary | ICD-10-CM | POA: Diagnosis not present

## 2012-09-05 DIAGNOSIS — M5020 Other cervical disc displacement, unspecified cervical region: Secondary | ICD-10-CM | POA: Diagnosis not present

## 2012-09-05 DIAGNOSIS — R209 Unspecified disturbances of skin sensation: Secondary | ICD-10-CM | POA: Diagnosis not present

## 2012-09-05 DIAGNOSIS — M79609 Pain in unspecified limb: Secondary | ICD-10-CM | POA: Diagnosis not present

## 2012-09-10 ENCOUNTER — Other Ambulatory Visit: Payer: Self-pay

## 2012-09-15 DIAGNOSIS — M4802 Spinal stenosis, cervical region: Secondary | ICD-10-CM | POA: Diagnosis not present

## 2012-09-17 DIAGNOSIS — Z6828 Body mass index (BMI) 28.0-28.9, adult: Secondary | ICD-10-CM | POA: Diagnosis not present

## 2012-09-17 DIAGNOSIS — G8929 Other chronic pain: Secondary | ICD-10-CM | POA: Diagnosis not present

## 2012-09-17 DIAGNOSIS — G43909 Migraine, unspecified, not intractable, without status migrainosus: Secondary | ICD-10-CM | POA: Diagnosis not present

## 2012-09-17 DIAGNOSIS — F411 Generalized anxiety disorder: Secondary | ICD-10-CM | POA: Diagnosis not present

## 2012-09-20 ENCOUNTER — Encounter (HOSPITAL_COMMUNITY): Payer: Self-pay | Admitting: Emergency Medicine

## 2012-09-20 ENCOUNTER — Emergency Department (HOSPITAL_COMMUNITY)
Admission: EM | Admit: 2012-09-20 | Discharge: 2012-09-20 | Disposition: A | Discharge status: Home / Self Care | Payer: Medicare Other | Attending: Emergency Medicine | Admitting: Emergency Medicine

## 2012-09-20 DIAGNOSIS — R109 Unspecified abdominal pain: Secondary | ICD-10-CM | POA: Diagnosis not present

## 2012-09-20 DIAGNOSIS — J45909 Unspecified asthma, uncomplicated: Secondary | ICD-10-CM | POA: Diagnosis not present

## 2012-09-20 DIAGNOSIS — E119 Type 2 diabetes mellitus without complications: Secondary | ICD-10-CM | POA: Diagnosis not present

## 2012-09-20 DIAGNOSIS — R1013 Epigastric pain: Secondary | ICD-10-CM | POA: Insufficient documentation

## 2012-09-20 DIAGNOSIS — F319 Bipolar disorder, unspecified: Secondary | ICD-10-CM | POA: Insufficient documentation

## 2012-09-20 DIAGNOSIS — E785 Hyperlipidemia, unspecified: Secondary | ICD-10-CM | POA: Insufficient documentation

## 2012-09-20 DIAGNOSIS — R079 Chest pain, unspecified: Secondary | ICD-10-CM | POA: Diagnosis not present

## 2012-09-20 DIAGNOSIS — R110 Nausea: Secondary | ICD-10-CM | POA: Insufficient documentation

## 2012-09-20 DIAGNOSIS — F17200 Nicotine dependence, unspecified, uncomplicated: Secondary | ICD-10-CM | POA: Diagnosis not present

## 2012-09-20 DIAGNOSIS — Z8679 Personal history of other diseases of the circulatory system: Secondary | ICD-10-CM | POA: Insufficient documentation

## 2012-09-20 DIAGNOSIS — Z79899 Other long term (current) drug therapy: Secondary | ICD-10-CM | POA: Insufficient documentation

## 2012-09-20 DIAGNOSIS — Z8719 Personal history of other diseases of the digestive system: Secondary | ICD-10-CM | POA: Insufficient documentation

## 2012-09-20 LAB — COMPREHENSIVE METABOLIC PANEL
AST: 27 U/L (ref 0–37)
Alkaline Phosphatase: 71 U/L (ref 39–117)
CO2: 30 mEq/L (ref 19–32)
Calcium: 10.3 mg/dL (ref 8.4–10.5)
Creatinine, Ser: 0.83 mg/dL (ref 0.50–1.10)
GFR calc Af Amer: >90 mL/min (ref >90)
GFR calc non Af Amer: 85 mL/min — ABNORMAL LOW (ref >90)
Potassium: 3.2 mEq/L — ABNORMAL LOW (ref 3.5–5.1)
Sodium: 139 mEq/L (ref 135–145)
Total Bilirubin: 0.3 mg/dL (ref 0.3–1.2)

## 2012-09-20 LAB — CBC WITH DIFFERENTIAL/PLATELET
Lymphocytes Relative: 25 % (ref 12–46)
Lymphs Abs: 2.7 10*3/uL (ref 0.7–4.0)
MCV: 90.7 fL (ref 78.0–100.0)
Neutro Abs: 7.2 10*3/uL (ref 1.7–7.7)
Neutrophils Relative %: 66 % (ref 43–77)
Platelets: 255 10*3/uL (ref 150–400)
RBC: 4.1 MIL/uL (ref 3.87–5.11)
WBC: 10.9 10*3/uL — ABNORMAL HIGH (ref 4.0–10.5)

## 2012-09-20 MED ORDER — HYDROMORPHONE HCL PF 1 MG/ML IJ SOLN
1.0000 mg | Freq: Once | INTRAMUSCULAR | Status: AC
  Administered 2012-09-20: 1 mg via INTRAVENOUS
  Filled 2012-09-20: qty 1

## 2012-09-20 MED ORDER — ONDANSETRON HCL 4 MG/2ML IJ SOLN
4.0000 mg | Freq: Once | INTRAMUSCULAR | Status: AC
  Administered 2012-09-20: 4 mg via INTRAVENOUS
  Filled 2012-09-20: qty 2

## 2012-09-20 MED ORDER — ONDANSETRON 4 MG PO TBDP: 4.0000 mg | ORAL_TABLET | Freq: Three times a day (TID) | ORAL | Status: DC | PRN

## 2012-09-20 MED ORDER — POTASSIUM CHLORIDE CRYS ER 20 MEQ PO TBCR
60.0000 meq | EXTENDED_RELEASE_TABLET | Freq: Once | ORAL | Status: AC
  Administered 2012-09-20: 60 meq via ORAL
  Filled 2012-09-20: qty 3

## 2012-09-20 NOTE — ED Notes (Signed)
Patient c/o epigastric pain since 3pm yesterday; states she has vomited x 4.  Patient states pain goes through her back and c/o abdomen feeling swollen.  Patient states she has a history of pancreatitis.

## 2012-09-20 NOTE — ED Provider Notes (Signed)
History     CSN: [id number]  Arrival date & time 09/20/12  0003   First MD Initiated Contact with Patient 09/20/12 0017      Chief Complaint  Patient presents with  . Chest Pain    (Consider location/radiation/quality/duration/timing/severity/associated sxs/prior treatment) HPI [name] is a 42 y.o. female who presents to the Emergency Department complaining of abdominal pain that began at 3 PM. Has taken Prilosec, Pepto bismol and hydrocodone without relief. Pain is a gnawing pain that extends upwards into the chest. Associated with nausea, no vomiting.   PCP Dr. Sherwood Gambler Past Medical History  Diagnosis Date  . Asthma   . Depression   . Migraine   . Pancreatitis chronic Idiopathic    Treated by Dr. Andrey Campanile at [facility] in the past with celiac blocks.  . Bipolar 1 disorder   . Hyperlipidemia 02/03/2012  . Diabetes mellitus   . COPD (chronic obstructive pulmonary disease)   . Neck pain 06/08/2012    Past Surgical History  Procedure Laterality Date  . Cholecystectomy    . Tonsillectomy    . Abdominal hysterectomy      Family History  Problem Relation Age of Onset  . Asthma Other   . Coronary artery disease Neg Hx     History  Substance Use Topics  . Smoking status: Current Every Day Smoker -- 0.25 packs/day for 15 years    Types: Cigarettes  . Smokeless tobacco: Former Neurosurgeon    Quit date: 03/05/2012  . Alcohol Use: No    OB History   Grav Para Term Preterm Abortions TAB SAB Ect Mult Living                  Review of Systems  Constitutional:       10 Systems reviewed and are negative for acute change except as noted in the HPI.  HENT: Negative for congestion.   Eyes: Negative for discharge and redness.  Respiratory: Negative for cough and shortness of breath.   Cardiovascular: Negative for chest pain.  Gastrointestinal: Positive for nausea and abdominal pain. Negative for vomiting.  Musculoskeletal: Negative for back pain.  Skin: Negative  for rash.  Neurological: Negative for syncope, numbness and headaches.  Psychiatric/Behavioral:       No behavior change.    Allergies  Penicillins; Sulfa antibiotics; and Prednisone  Home Medications   Current Outpatient Rx  Name  Route  Sig  Dispense  Refill  . albuterol (PROVENTIL HFA;VENTOLIN HFA) 108 (90 BASE) MCG/ACT inhaler   Inhalation   Inhale 2 puffs into the lungs daily as needed. For shortness of breath         . albuterol (PROVENTIL) (2.5 MG/3ML) 0.083% nebulizer solution   Nebulization   Take 2.5 mg by nebulization every 6 (six) hours as needed. For shortness of breath          . amLODipine (NORVASC) 10 MG tablet   Oral   Take 60 mg by mouth every morning.          . clonazePAM (KLONOPIN) 0.5 MG tablet   Oral   Take 0.5 mg by mouth 3 (three) times daily as needed. For anxiety         . docusate sodium (COLACE) 100 MG capsule   Oral   Take 100 mg by mouth at bedtime.          . gabapentin (NEURONTIN) 300 MG capsule   Oral   Take 300 mg by mouth 3 (three)  times daily.         Marland Kitchen omeprazole (PRILOSEC) 20 MG capsule   Oral   Take 20 mg by mouth 3 (three) times daily.           . ondansetron (ZOFRAN) 4 MG tablet   Oral   Take 4-8 mg by mouth every 6 (six) hours as needed. 1 or 2 po q6h prn nausea         . promethazine (PHENERGAN) 25 MG tablet   Oral   Take 1 tablet (25 mg total) by mouth every 6 (six) hours as needed for nausea.   15 tablet   0   . sertraline (ZOLOFT) 25 MG tablet   Oral   Take 25 mg by mouth at bedtime.           BP 153/92  Pulse 101  Temp(Src) 98.2 F (36.8 C) (Oral)  Resp 22  Ht 5\' 2"  (1.575 m)  Wt 160 lb (72.576 kg)  BMI 29.26 kg/m2  SpO2 99%  Physical Exam  Nursing note and vitals reviewed. Constitutional:  Awake, alert, nontoxic appearance.  HENT:  Head: Atraumatic.  Eyes: Right eye exhibits no discharge. Left eye exhibits no discharge.  Neck: Neck supple.  Pulmonary/Chest: Effort normal. She  exhibits no tenderness.  Abdominal: Soft. There is tenderness. There is no rebound.  Musculoskeletal: She exhibits no tenderness.  Baseline ROM, no obvious new focal weakness.  Neurological:  Mental status and motor strength appears baseline for patient and situation.  Skin: No rash noted.  Psychiatric: She has a normal mood and affect.    ED Course  Procedures (including critical care time) Results for orders placed during the hospital encounter of 09/20/12  COMPREHENSIVE METABOLIC PANEL      Result Value Range   Sodium 139  135 - 145 mEq/L   Potassium 3.2 (*) 3.5 - 5.1 mEq/L   Chloride 99  96 - 112 mEq/L   CO2 30  19 - 32 mEq/L   Glucose, Bld 131 (*) 70 - 99 mg/dL   BUN 18  6 - 23 mg/dL   Creatinine, Ser 0.45  0.50 - 1.10 mg/dL   Calcium 40.9  8.4 - 81.1 mg/dL   Total Protein 7.2  6.0 - 8.3 g/dL   Albumin 4.1  3.5 - 5.2 g/dL   AST 27  0 - 37 U/L   ALT 30  0 - 35 U/L   Alkaline Phosphatase 71  39 - 117 U/L   Total Bilirubin 0.3  0.3 - 1.2 mg/dL   GFR calc non Af Amer 85 (*) >90 mL/min   GFR calc Af Amer >90  >90 mL/min  LIPASE, BLOOD      Result Value Range   Lipase 15  11 - 59 U/L  CBC WITH DIFFERENTIAL      Result Value Range   WBC 10.9 (*) 4.0 - 10.5 K/uL   RBC 4.10  3.87 - 5.11 MIL/uL   Hemoglobin 12.7  12.0 - 15.0 g/dL   HCT 91.4  78.2 - 95.6 %   MCV 90.7  78.0 - 100.0 fL   MCH 31.0  26.0 - 34.0 pg   MCHC 34.1  30.0 - 36.0 g/dL   RDW 21.3  08.6 - 57.8 %   Platelets 255  150 - 400 K/uL   Neutrophils Relative 66  43 - 77 %   Neutro Abs 7.2  1.7 - 7.7 K/uL   Lymphocytes Relative 25  12 - 46 %  Lymphs Abs 2.7  0.7 - 4.0 K/uL   Monocytes Relative 6  3 - 12 %   Monocytes Absolute 0.7  0.1 - 1.0 K/uL   Eosinophils Relative 3  0 - 5 %   Eosinophils Absolute 0.4  0.0 - 0.7 K/uL   Basophils Relative 0  0 - 1 %   Basophils Absolute 0.0  0.0 - 0.1 K/uL     Date: 09/20/2012   0011  Rate: 103  Rhythm: sinus tachycardia  QRS Axis: normal  Intervals: normal  ST/T  Wave abnormalities: normal  Conduction Disutrbances:RBBB  Narrative Interpretation:   Old EKG Reviewed: none available  0234 Feels better. Pain is better.  MDM  Patient with hiatal hernia, h/o biliary pain, here with pain since this morning. She has been given dilaudid and zofran with relief. Labs are normal except for slightly low potassium. Given potassium. Lipase is normal.  Pt feels improved after observation and/or treatment in ED.Pt stable in ED with no significant deterioration in condition.The patient appears reasonably screened and/or stabilized for discharge and I doubt any other medical condition or other [facility] requiring further screening, evaluation, or treatment in the ED at this time prior to discharge.  MDM Reviewed: nursing note and vitals Interpretation: labs and ECG           Nicoletta Dress. Colon Branch, MD 09/20/12 [phone]

## 2012-09-20 NOTE — ED Notes (Signed)
Patient tolerated po KCL but states it hurts to swallow.  Patient states that it seems food gets stuck easily.

## 2012-09-20 NOTE — ED Notes (Signed)
Pt alert & oriented x4, stable gait. Patient given discharge instructions, paperwork & prescription(s). Patient  instructed to stop at the registration desk to finish any additional paperwork. Patient verbalized understanding. Pt left department w/ no further questions. 

## 2012-09-28 DIAGNOSIS — M5412 Radiculopathy, cervical region: Secondary | ICD-10-CM | POA: Diagnosis not present

## 2012-09-28 DIAGNOSIS — M5137 Other intervertebral disc degeneration, lumbosacral region: Secondary | ICD-10-CM | POA: Diagnosis not present

## 2012-09-28 DIAGNOSIS — M4802 Spinal stenosis, cervical region: Secondary | ICD-10-CM | POA: Diagnosis not present

## 2012-09-28 DIAGNOSIS — IMO0002 Reserved for concepts with insufficient information to code with codable children: Secondary | ICD-10-CM | POA: Diagnosis not present

## 2012-10-01 ENCOUNTER — Emergency Department (HOSPITAL_COMMUNITY)
Admission: EM | Admit: 2012-10-01 | Discharge: 2012-10-02 | Disposition: A | Discharge status: Home / Self Care | Payer: Medicare Other | Attending: Emergency Medicine | Admitting: Emergency Medicine

## 2012-10-01 ENCOUNTER — Encounter (HOSPITAL_COMMUNITY): Payer: Self-pay

## 2012-10-01 DIAGNOSIS — R220 Localized swelling, mass and lump, head: Secondary | ICD-10-CM | POA: Insufficient documentation

## 2012-10-01 DIAGNOSIS — J449 Chronic obstructive pulmonary disease, unspecified: Secondary | ICD-10-CM | POA: Diagnosis not present

## 2012-10-01 DIAGNOSIS — J4489 Other specified chronic obstructive pulmonary disease: Secondary | ICD-10-CM | POA: Insufficient documentation

## 2012-10-01 DIAGNOSIS — R112 Nausea with vomiting, unspecified: Secondary | ICD-10-CM | POA: Insufficient documentation

## 2012-10-01 DIAGNOSIS — R52 Pain, unspecified: Secondary | ICD-10-CM | POA: Insufficient documentation

## 2012-10-01 DIAGNOSIS — F17200 Nicotine dependence, unspecified, uncomplicated: Secondary | ICD-10-CM | POA: Diagnosis not present

## 2012-10-01 DIAGNOSIS — M6281 Muscle weakness (generalized): Secondary | ICD-10-CM | POA: Insufficient documentation

## 2012-10-01 DIAGNOSIS — Z8719 Personal history of other diseases of the digestive system: Secondary | ICD-10-CM | POA: Diagnosis not present

## 2012-10-01 DIAGNOSIS — H9209 Otalgia, unspecified ear: Secondary | ICD-10-CM | POA: Diagnosis not present

## 2012-10-01 DIAGNOSIS — Z79899 Other long term (current) drug therapy: Secondary | ICD-10-CM | POA: Diagnosis not present

## 2012-10-01 DIAGNOSIS — E119 Type 2 diabetes mellitus without complications: Secondary | ICD-10-CM | POA: Diagnosis not present

## 2012-10-01 DIAGNOSIS — F319 Bipolar disorder, unspecified: Secondary | ICD-10-CM | POA: Diagnosis not present

## 2012-10-01 DIAGNOSIS — Z862 Personal history of diseases of the blood and blood-forming organs and certain disorders involving the immune mechanism: Secondary | ICD-10-CM | POA: Diagnosis not present

## 2012-10-01 DIAGNOSIS — Z8679 Personal history of other diseases of the circulatory system: Secondary | ICD-10-CM | POA: Diagnosis not present

## 2012-10-01 DIAGNOSIS — E860 Dehydration: Secondary | ICD-10-CM | POA: Diagnosis not present

## 2012-10-01 DIAGNOSIS — R51 Headache: Secondary | ICD-10-CM | POA: Diagnosis not present

## 2012-10-01 DIAGNOSIS — R6883 Chills (without fever): Secondary | ICD-10-CM | POA: Diagnosis not present

## 2012-10-01 DIAGNOSIS — Z8639 Personal history of other endocrine, nutritional and metabolic disease: Secondary | ICD-10-CM | POA: Insufficient documentation

## 2012-10-01 DIAGNOSIS — R221 Localized swelling, mass and lump, neck: Secondary | ICD-10-CM | POA: Insufficient documentation

## 2012-10-01 MED ORDER — SODIUM CHLORIDE 0.9 % IV SOLN
INTRAVENOUS | Status: DC
  Administered 2012-10-02: via INTRAVENOUS

## 2012-10-01 MED ORDER — DIPHENHYDRAMINE HCL 50 MG/ML IJ SOLN
25.0000 mg | Freq: Once | INTRAMUSCULAR | Status: AC
  Administered 2012-10-02: 25 mg via INTRAVENOUS
  Filled 2012-10-01: qty 1

## 2012-10-01 MED ORDER — ACETAMINOPHEN 325 MG PO TABS
650.0000 mg | ORAL_TABLET | Freq: Once | ORAL | Status: AC
  Administered 2012-10-02: 650 mg via ORAL
  Filled 2012-10-01: qty 2

## 2012-10-01 MED ORDER — SODIUM CHLORIDE 0.9 % IV BOLUS (SEPSIS)
1000.0000 mL | Freq: Once | INTRAVENOUS | Status: AC
  Administered 2012-10-02: 1000 mL via INTRAVENOUS

## 2012-10-01 MED ORDER — KETOROLAC TROMETHAMINE 30 MG/ML IJ SOLN
30.0000 mg | Freq: Once | INTRAMUSCULAR | Status: AC
  Administered 2012-10-02: 30 mg via INTRAVENOUS
  Filled 2012-10-01: qty 1

## 2012-10-01 MED ORDER — METOCLOPRAMIDE HCL 5 MG/ML IJ SOLN
10.0000 mg | Freq: Once | INTRAMUSCULAR | Status: AC
  Administered 2012-10-02: 10 mg via INTRAVENOUS
  Filled 2012-10-01: qty 2

## 2012-10-01 NOTE — ED Notes (Signed)
Having body aches all over, started a new medication and I don't know if it is a side effect to that or if I have the flu per pt.

## 2012-10-01 NOTE — ED Provider Notes (Signed)
History  This chart was scribed for Sunnie Nielsen, MD by Erskine Emery, ED Scribe. This patient was seen in room APA05/[facility] and the patient's care was started at 23:01.   CSN: [id number]  Arrival date & time 10/01/12  2117   First MD Initiated Contact with Patient 10/01/12 2301      Chief Complaint  Patient presents with  . Generalized Body Aches  . Headache    (Consider location/radiation/quality/duration/timing/severity/associated sxs/prior treatment) The history is provided by the patient. No language interpreter was used.  [name] is a 42 y.o. female who presents to the Emergency Department complaining of diffuse body aches since this morning. Pt reports some associated chills, hot flashes, tongue swelling, nausea, emesis (3 episodes), bilateral leg weakness and pain, and left ear pain. Pt denies any associated cough, sore throat, blood in emesis, diarrhea, known sick contacts, dysuria, or urinary frequency.  Pt also presents with a throbbing headache for 2 days Pt was put on neurontin for her neck pain, and her dosage was doubled on Thursday (2 days ago). She now takes 600mg  x3/day. She also changed her blood pressure medication recently. Pt also has a h/o asthma and pancreatitis.   Dr. Sherwood Gambler is the pt's PCP who she has not contacted.   Past Medical History  Diagnosis Date  . Asthma   . Depression   . Migraine   . Pancreatitis chronic Idiopathic    Treated by Dr. Andrey Campanile at [facility] in the past with celiac blocks.  . Bipolar 1 disorder   . Hyperlipidemia 02/03/2012  . Diabetes mellitus   . COPD (chronic obstructive pulmonary disease)   . Neck pain 06/08/2012    Past Surgical History  Procedure Laterality Date  . Cholecystectomy    . Tonsillectomy    . Abdominal hysterectomy      Family History  Problem Relation Age of Onset  . Asthma Other   . Coronary artery disease Neg Hx     History  Substance Use Topics  . Smoking status: Current Every Day  Smoker -- 0.25 packs/day for 15 years    Types: Cigarettes  . Smokeless tobacco: Former Neurosurgeon    Quit date: 03/05/2012  . Alcohol Use: No    OB History   Grav Para Term Preterm Abortions TAB SAB Ect Mult Living                  Review of Systems A complete 10 system review of systems was obtained and all systems are negative except as noted in the HPI and PMH.    Allergies  Penicillins; Sulfa antibiotics; and Prednisone  Home Medications   Current Outpatient Rx  Name  Route  Sig  Dispense  Refill  . albuterol (PROVENTIL HFA;VENTOLIN HFA) 108 (90 BASE) MCG/ACT inhaler   Inhalation   Inhale 2 puffs into the lungs daily as needed. For shortness of breath         . albuterol (PROVENTIL) (2.5 MG/3ML) 0.083% nebulizer solution   Nebulization   Take 2.5 mg by nebulization every 6 (six) hours as needed. For shortness of breath          . amLODipine (NORVASC) 10 MG tablet   Oral   Take 60 mg by mouth every morning.          . clonazePAM (KLONOPIN) 0.5 MG tablet   Oral   Take 0.5 mg by mouth 3 (three) times daily as needed. For anxiety         .  docusate sodium (COLACE) 100 MG capsule   Oral   Take 100 mg by mouth at bedtime.          . gabapentin (NEURONTIN) 300 MG capsule   Oral   Take 600 mg by mouth 3 (three) times daily.          Marland Kitchen omeprazole (PRILOSEC) 20 MG capsule   Oral   Take 20 mg by mouth 3 (three) times daily.           . ondansetron (ZOFRAN ODT) 4 MG disintegrating tablet   Oral   Take 1 tablet (4 mg total) by mouth every 8 (eight) hours as needed for nausea.   20 tablet   0   . sertraline (ZOLOFT) 25 MG tablet   Oral   Take 25 mg by mouth at bedtime.           BP 131/82  Pulse 104  Temp(Src) 98.5 F (36.9 C) (Oral)  Resp 20  Ht 5\' 2"  (1.575 m)  Wt 156 lb (70.761 kg)  BMI 28.53 kg/m2  SpO2 96%  Physical Exam  Nursing note and vitals reviewed. Constitutional: She is oriented to person, place, and time. She appears  well-developed and well-nourished. No distress.  HENT:  Head: Normocephalic and atraumatic.  Right Ear: External ear normal.  Dry mucus membranes. Scarring on left TM.  Eyes: EOM are normal. Pupils are equal, round, and reactive to light.  Neck: Normal range of motion. Neck supple. No tracheal deviation present.  No nuchal rigidity.  Cardiovascular: Normal rate, regular rhythm and normal heart sounds.   Pulmonary/Chest: Effort normal. No respiratory distress.  Abdominal: Soft. She exhibits no distension.  Musculoskeletal: Normal range of motion. She exhibits no edema.  Lymphadenopathy:    She has no cervical adenopathy.  Neurological: She is alert and oriented to person, place, and time.  Skin: Skin is warm and dry.  Psychiatric: She has a normal mood and affect.    ED Course  Procedures (including critical care time) DIAGNOSTIC STUDIES: Oxygen Saturation is 96% on room air, adequate by my interpretation.    COORDINATION OF CARE: 23:29--I evaluated the patient and we discussed a treatment plan including IV fluids and medications to which the pt agreed.   3:11 AM recheck feeling better and comfortable for d/c home, will f/u ENT in [facility] for ear pain and her spine doctor as scheduled on Tuesday for injections and recommendations regarding her medications. Plan RX percocet short course pain control until she is seen in clinic.   MDM  Neck pain, HA and generalized body aches today - PT concerned she is having reaction to neurontin that she recently increased her dose of.  No deficits or fever on exam. No indication for labs, UA or emergent imaging at this time. Condition improved with IVFs and HA cocktail. VS and nursing notes reviewed.     I personally performed the services described in this documentation, which was scribed in my presence. The recorded information has been reviewed and is accurate.     Sunnie Nielsen, MD 10/02/12 [phone]

## 2012-10-02 MED ORDER — ONDANSETRON HCL 4 MG PO TABS: 4.0000 mg | ORAL_TABLET | Freq: Three times a day (TID) | ORAL | Status: DC | PRN

## 2012-10-02 MED ORDER — OXYCODONE-ACETAMINOPHEN 5-325 MG PO TABS: 2.0000 | ORAL_TABLET | ORAL | Status: DC | PRN

## 2012-10-04 DIAGNOSIS — M546 Pain in thoracic spine: Secondary | ICD-10-CM | POA: Diagnosis not present

## 2012-10-04 DIAGNOSIS — IMO0002 Reserved for concepts with insufficient information to code with codable children: Secondary | ICD-10-CM | POA: Diagnosis not present

## 2012-10-17 ENCOUNTER — Emergency Department (HOSPITAL_COMMUNITY)
Admission: EM | Admit: 2012-10-17 | Discharge: 2012-10-17 | Disposition: A | Discharge status: Home / Self Care | Payer: Medicare Other | Attending: Emergency Medicine | Admitting: Emergency Medicine

## 2012-10-17 ENCOUNTER — Emergency Department (HOSPITAL_COMMUNITY): Payer: Medicare Other

## 2012-10-17 ENCOUNTER — Encounter (HOSPITAL_COMMUNITY): Payer: Self-pay | Admitting: *Deleted

## 2012-10-17 DIAGNOSIS — E785 Hyperlipidemia, unspecified: Secondary | ICD-10-CM | POA: Insufficient documentation

## 2012-10-17 DIAGNOSIS — Z8739 Personal history of other diseases of the musculoskeletal system and connective tissue: Secondary | ICD-10-CM | POA: Insufficient documentation

## 2012-10-17 DIAGNOSIS — J4489 Other specified chronic obstructive pulmonary disease: Secondary | ICD-10-CM | POA: Insufficient documentation

## 2012-10-17 DIAGNOSIS — J3489 Other specified disorders of nose and nasal sinuses: Secondary | ICD-10-CM | POA: Insufficient documentation

## 2012-10-17 DIAGNOSIS — E119 Type 2 diabetes mellitus without complications: Secondary | ICD-10-CM | POA: Diagnosis not present

## 2012-10-17 DIAGNOSIS — F17200 Nicotine dependence, unspecified, uncomplicated: Secondary | ICD-10-CM | POA: Diagnosis not present

## 2012-10-17 DIAGNOSIS — H9209 Otalgia, unspecified ear: Secondary | ICD-10-CM | POA: Insufficient documentation

## 2012-10-17 DIAGNOSIS — Z8679 Personal history of other diseases of the circulatory system: Secondary | ICD-10-CM | POA: Diagnosis not present

## 2012-10-17 DIAGNOSIS — H7290 Unspecified perforation of tympanic membrane, unspecified ear: Secondary | ICD-10-CM | POA: Diagnosis not present

## 2012-10-17 DIAGNOSIS — F319 Bipolar disorder, unspecified: Secondary | ICD-10-CM | POA: Diagnosis not present

## 2012-10-17 DIAGNOSIS — J449 Chronic obstructive pulmonary disease, unspecified: Secondary | ICD-10-CM | POA: Insufficient documentation

## 2012-10-17 DIAGNOSIS — J9819 Other pulmonary collapse: Secondary | ICD-10-CM | POA: Diagnosis not present

## 2012-10-17 DIAGNOSIS — R51 Headache: Secondary | ICD-10-CM | POA: Insufficient documentation

## 2012-10-17 DIAGNOSIS — R490 Dysphonia: Secondary | ICD-10-CM | POA: Diagnosis not present

## 2012-10-17 DIAGNOSIS — J019 Acute sinusitis, unspecified: Secondary | ICD-10-CM | POA: Diagnosis not present

## 2012-10-17 DIAGNOSIS — H7292 Unspecified perforation of tympanic membrane, left ear: Secondary | ICD-10-CM

## 2012-10-17 DIAGNOSIS — J329 Chronic sinusitis, unspecified: Secondary | ICD-10-CM | POA: Insufficient documentation

## 2012-10-17 DIAGNOSIS — IMO0001 Reserved for inherently not codable concepts without codable children: Secondary | ICD-10-CM | POA: Insufficient documentation

## 2012-10-17 DIAGNOSIS — Z79899 Other long term (current) drug therapy: Secondary | ICD-10-CM | POA: Diagnosis not present

## 2012-10-17 MED ORDER — OFLOXACIN 0.3 % OT SOLN: 5.0000 [drp] | Freq: Two times a day (BID) | OTIC | Status: DC

## 2012-10-17 MED ORDER — IBUPROFEN 800 MG PO TABS
800.0000 mg | ORAL_TABLET | Freq: Once | ORAL | Status: AC
  Administered 2012-10-17: 800 mg via ORAL
  Filled 2012-10-17: qty 1

## 2012-10-17 MED ORDER — AZITHROMYCIN 250 MG PO TABS: ORAL_TABLET | ORAL | Status: DC

## 2012-10-17 MED ORDER — ALBUTEROL SULFATE (5 MG/ML) 0.5% IN NEBU
2.5000 mg | INHALATION_SOLUTION | Freq: Once | RESPIRATORY_TRACT | Status: AC
  Administered 2012-10-17: 2.5 mg via RESPIRATORY_TRACT
  Filled 2012-10-17: qty 0.5

## 2012-10-17 NOTE — ED Notes (Signed)
Discharge instructions reviewed with pt, questions answered. Pt verbalized understanding.  

## 2012-10-17 NOTE — ED Notes (Signed)
Ambulated pt around the nurses station; O2 Sats stayed between 95% and 97%.

## 2012-10-17 NOTE — ED Notes (Signed)
Non productive cough x 2 days with left ear pain.

## 2012-10-17 NOTE — ED Provider Notes (Signed)
History     CSN: [id number]  Arrival date & time 10/17/12  1846   First MD Initiated Contact with Patient 10/17/12 1855      Chief Complaint  Patient presents with  . Cough  . Otalgia    (Consider location/radiation/quality/duration/timing/severity/associated sxs/prior treatment) HPI Comments: Patient presents with two-day history of nonproductive cough, left ear pain, coarse voice. Denies any abdominal pain, nausea, vomiting or fever. Endorses sick contacts at home. Denies any nausea, vomiting, diarrhea abdominal pain. He was seen 2 weeks ago for body aches she states that improved. She is not follow up with ENT regarding her chronic left ear problems.  The history is provided by the patient.    Past Medical History  Diagnosis Date  . Asthma   . Depression   . Migraine   . Pancreatitis chronic Idiopathic    Treated by Dr. Andrey Campanile at [facility] in the past with celiac blocks.  . Bipolar 1 disorder   . Hyperlipidemia 02/03/2012  . Diabetes mellitus   . COPD (chronic obstructive pulmonary disease)   . Neck pain 06/08/2012    Past Surgical History  Procedure Laterality Date  . Cholecystectomy    . Tonsillectomy    . Abdominal hysterectomy      Family History  Problem Relation Age of Onset  . Asthma Other   . Coronary artery disease Neg Hx     History  Substance Use Topics  . Smoking status: Current Every Day Smoker -- 0.25 packs/day for 15 years    Types: Cigarettes  . Smokeless tobacco: Former Neurosurgeon    Quit date: 03/05/2012  . Alcohol Use: No    OB History   Grav Para Term Preterm Abortions TAB SAB Ect Mult Living                  Review of Systems  Constitutional: Negative for activity change and appetite change.  HENT: Positive for ear pain, congestion, rhinorrhea and voice change. Negative for sore throat.   Respiratory: Positive for cough.   Cardiovascular: Negative for chest pain.  Gastrointestinal: Negative for nausea, vomiting and abdominal  pain.  Genitourinary: Negative for dysuria and hematuria.  Musculoskeletal: Positive for myalgias and arthralgias.  Neurological: Positive for headaches. Negative for dizziness and weakness.  A complete 10 system review of systems was obtained and all systems are negative except as noted in the HPI and PMH.    Allergies  Penicillins; Sulfa antibiotics; and Prednisone  Home Medications   Current Outpatient Rx  Name  Route  Sig  Dispense  Refill  . albuterol (PROVENTIL HFA;VENTOLIN HFA) 108 (90 BASE) MCG/ACT inhaler   Inhalation   Inhale 2 puffs into the lungs daily as needed. For shortness of breath         . albuterol (PROVENTIL) (2.5 MG/3ML) 0.083% nebulizer solution   Nebulization   Take 2.5 mg by nebulization every 6 (six) hours as needed. For shortness of breath          . amLODipine (NORVASC) 10 MG tablet   Oral   Take 60 mg by mouth every morning.          . clonazePAM (KLONOPIN) 0.5 MG tablet   Oral   Take 0.5 mg by mouth 3 (three) times daily as needed. For anxiety         . docusate sodium (COLACE) 100 MG capsule   Oral   Take 100 mg by mouth at bedtime.          Marland Kitchen  gabapentin (NEURONTIN) 300 MG capsule   Oral   Take 600 mg by mouth 3 (three) times daily.          Marland Kitchen omeprazole (PRILOSEC) 20 MG capsule   Oral   Take 20 mg by mouth 3 (three) times daily.           . ondansetron (ZOFRAN) 4 MG tablet   Oral   Take 1 tablet (4 mg total) by mouth every 8 (eight) hours as needed for nausea.   20 tablet   0   . oxyCODONE-acetaminophen (PERCOCET/ROXICET) 5-325 MG per tablet   Oral   Take 2 tablets by mouth every 4 (four) hours as needed for pain.   6 tablet   0   . sertraline (ZOLOFT) 25 MG tablet   Oral   Take 25 mg by mouth at bedtime.         Marland Kitchen azithromycin (ZITHROMAX Z-PAK) 250 MG tablet      2 tablets by mouth today, then one tablet daily for 4 days.   6 tablet   0   . ofloxacin (FLOXIN) 0.3 % otic solution   Otic   Place 5 drops  in ear(s) 2 (two) times daily.   5 mL   0     BP 159/95  Pulse 118  Temp(Src) 97.7 F (36.5 C) (Oral)  Resp 18  Ht 5\' 2"  (1.575 m)  Wt 158 lb (71.668 kg)  BMI 28.89 kg/m2  SpO2 98%  Physical Exam  Constitutional: She is oriented to person, place, and time. She appears well-developed and well-nourished. No distress.  Hoarse voice  HENT:  Head: Normocephalic and atraumatic.  Right Ear: External ear normal.  Mouth/Throat: Oropharynx is clear and moist. No oropharyngeal exudate.  Scarring of L TM.  Otosclerosis. Small perforation at 6 oclock position. No tragus or mastoid pain  Eyes: Conjunctivae and EOM are normal. Pupils are equal, round, and reactive to light.  Neck: Normal range of motion. Neck supple.  Cardiovascular: Normal rate, regular rhythm and normal heart sounds.   No murmur heard. Pulmonary/Chest: Breath sounds normal. No respiratory distress.  Abdominal: Soft. There is no tenderness. There is no rebound and no guarding.  Neurological: She is alert and oriented to person, place, and time. No cranial nerve deficit. She exhibits normal muscle tone. Coordination normal.  Skin: Skin is warm.    ED Course  Procedures (including critical care time)  Labs Reviewed - No data to display Dg Chest 2 View  10/17/2012  *RADIOLOGY REPORT*  Clinical Data: Cough.  CHEST - 2 VIEW  Comparison: 06/07/2012.  Findings: The cardiac silhouette, mediastinal and hilar contours are normal and stable.  Low lung volumes on the PA film with vascular crowding and bibasilar atelectasis.  No definite infiltrates, effusions or edema.  There is a stable T12 compression fracture.  Bony structures are otherwise normal.  IMPRESSION: Low lung volumes with vascular crowding and bibasilar atelectasis.   Original Report Authenticated By: Rudie Meyer, M.D.      1. Sinusitis   2. Ruptured tympanic membrane, left       MDM  Cough with L ear pain.  No fever, SOB, CP, abdominal pain.  Left tympanic  membrane shows scarring as well as small perforation. Patient given nebulizer in ED. Started on antibiotics for sinusitis/bronchitis. Ambulatory without desaturation. Followup with ENT regarding tympanic membrane rupture. Placed on ofloxacin drops.      [name], MD 10/17/12 2124

## 2012-10-24 DIAGNOSIS — M5412 Radiculopathy, cervical region: Secondary | ICD-10-CM | POA: Diagnosis not present

## 2012-10-24 DIAGNOSIS — M4802 Spinal stenosis, cervical region: Secondary | ICD-10-CM | POA: Diagnosis not present

## 2012-10-24 DIAGNOSIS — M545 Low back pain, unspecified: Secondary | ICD-10-CM | POA: Diagnosis not present

## 2012-10-24 DIAGNOSIS — IMO0001 Reserved for inherently not codable concepts without codable children: Secondary | ICD-10-CM | POA: Diagnosis not present

## 2012-10-31 ENCOUNTER — Emergency Department (HOSPITAL_COMMUNITY): Payer: Medicare Other

## 2012-10-31 ENCOUNTER — Emergency Department (HOSPITAL_COMMUNITY)
Admission: EM | Admit: 2012-10-31 | Discharge: 2012-10-31 | Disposition: A | Discharge status: Home / Self Care | Payer: Medicare Other | Attending: Emergency Medicine | Admitting: Emergency Medicine

## 2012-10-31 ENCOUNTER — Encounter (HOSPITAL_COMMUNITY): Payer: Self-pay | Admitting: *Deleted

## 2012-10-31 DIAGNOSIS — G8929 Other chronic pain: Secondary | ICD-10-CM | POA: Diagnosis not present

## 2012-10-31 DIAGNOSIS — R079 Chest pain, unspecified: Secondary | ICD-10-CM | POA: Insufficient documentation

## 2012-10-31 DIAGNOSIS — F319 Bipolar disorder, unspecified: Secondary | ICD-10-CM | POA: Diagnosis not present

## 2012-10-31 DIAGNOSIS — E119 Type 2 diabetes mellitus without complications: Secondary | ICD-10-CM | POA: Insufficient documentation

## 2012-10-31 DIAGNOSIS — F17200 Nicotine dependence, unspecified, uncomplicated: Secondary | ICD-10-CM | POA: Diagnosis not present

## 2012-10-31 DIAGNOSIS — Z862 Personal history of diseases of the blood and blood-forming organs and certain disorders involving the immune mechanism: Secondary | ICD-10-CM | POA: Insufficient documentation

## 2012-10-31 DIAGNOSIS — R112 Nausea with vomiting, unspecified: Secondary | ICD-10-CM | POA: Insufficient documentation

## 2012-10-31 DIAGNOSIS — R1013 Epigastric pain: Secondary | ICD-10-CM | POA: Insufficient documentation

## 2012-10-31 DIAGNOSIS — Z8719 Personal history of other diseases of the digestive system: Secondary | ICD-10-CM | POA: Insufficient documentation

## 2012-10-31 DIAGNOSIS — J45901 Unspecified asthma with (acute) exacerbation: Secondary | ICD-10-CM | POA: Insufficient documentation

## 2012-10-31 DIAGNOSIS — R109 Unspecified abdominal pain: Secondary | ICD-10-CM

## 2012-10-31 DIAGNOSIS — G43909 Migraine, unspecified, not intractable, without status migrainosus: Secondary | ICD-10-CM | POA: Insufficient documentation

## 2012-10-31 DIAGNOSIS — J441 Chronic obstructive pulmonary disease with (acute) exacerbation: Secondary | ICD-10-CM | POA: Diagnosis not present

## 2012-10-31 DIAGNOSIS — Z8639 Personal history of other endocrine, nutritional and metabolic disease: Secondary | ICD-10-CM | POA: Insufficient documentation

## 2012-10-31 DIAGNOSIS — Z79899 Other long term (current) drug therapy: Secondary | ICD-10-CM | POA: Diagnosis not present

## 2012-10-31 DIAGNOSIS — M549 Dorsalgia, unspecified: Secondary | ICD-10-CM | POA: Insufficient documentation

## 2012-10-31 DIAGNOSIS — M542 Cervicalgia: Secondary | ICD-10-CM | POA: Insufficient documentation

## 2012-10-31 DIAGNOSIS — M7989 Other specified soft tissue disorders: Secondary | ICD-10-CM | POA: Diagnosis not present

## 2012-10-31 DIAGNOSIS — R6883 Chills (without fever): Secondary | ICD-10-CM | POA: Diagnosis not present

## 2012-10-31 LAB — CBC WITH DIFFERENTIAL/PLATELET
Basophils Absolute: 0 10*3/uL (ref 0.0–0.1)
Basophils Relative: 0 % (ref 0–1)
Eosinophils Absolute: 0.4 10*3/uL (ref 0.0–0.7)
HCT: 34.1 % — ABNORMAL LOW (ref 36.0–46.0)
Hemoglobin: 11 g/dL — ABNORMAL LOW (ref 12.0–15.0)
Lymphocytes Relative: 25 % (ref 12–46)
Lymphs Abs: 2.1 10*3/uL (ref 0.7–4.0)
MCH: 28.2 pg (ref 26.0–34.0)
MCHC: 32.3 g/dL (ref 30.0–36.0)
Monocytes Absolute: 0.6 10*3/uL (ref 0.1–1.0)
Monocytes Relative: 7 % (ref 3–12)
Neutro Abs: 5.2 10*3/uL (ref 1.7–7.7)
Neutrophils Relative %: 63 % (ref 43–77)
RBC: 3.9 MIL/uL (ref 3.87–5.11)
WBC: 8.4 10*3/uL (ref 4.0–10.5)

## 2012-10-31 LAB — COMPREHENSIVE METABOLIC PANEL
ALT: 18 U/L (ref 0–35)
AST: 15 U/L (ref 0–37)
Albumin: 3.5 g/dL (ref 3.5–5.2)
Alkaline Phosphatase: 74 U/L (ref 39–117)
Chloride: 100 mEq/L (ref 96–112)
GFR calc Af Amer: >90 mL/min (ref >90)
Glucose, Bld: 126 mg/dL — ABNORMAL HIGH (ref 70–99)
Potassium: 3.9 mEq/L (ref 3.5–5.1)
Sodium: 137 mEq/L (ref 135–145)
Total Protein: 6.6 g/dL (ref 6.0–8.3)

## 2012-10-31 LAB — URINALYSIS, ROUTINE W REFLEX MICROSCOPIC
Hgb urine dipstick: NEGATIVE
Leukocytes, UA: NEGATIVE
Protein, ur: NEGATIVE mg/dL
Specific Gravity, Urine: 1.015 (ref 1.005–1.030)
Urobilinogen, UA: 0.2 mg/dL (ref 0.0–1.0)
pH: 5.5 (ref 5.0–8.0)

## 2012-10-31 MED ORDER — ONDANSETRON HCL 4 MG/2ML IJ SOLN
4.0000 mg | Freq: Once | INTRAMUSCULAR | Status: AC
  Administered 2012-10-31: 4 mg via INTRAVENOUS
  Filled 2012-10-31: qty 2

## 2012-10-31 MED ORDER — HYDROMORPHONE HCL PF 1 MG/ML IJ SOLN
1.0000 mg | Freq: Once | INTRAMUSCULAR | Status: AC
  Administered 2012-10-31: 1 mg via INTRAVENOUS
  Filled 2012-10-31: qty 1

## 2012-10-31 MED ORDER — HYDROCODONE-ACETAMINOPHEN 5-325 MG PO TABS: 1.0000 | ORAL_TABLET | Freq: Four times a day (QID) | ORAL | Status: DC | PRN

## 2012-10-31 MED ORDER — SODIUM CHLORIDE 0.9 % IV BOLUS (SEPSIS)
250.0000 mL | Freq: Once | INTRAVENOUS | Status: AC
  Administered 2012-10-31: 1000 mL via INTRAVENOUS

## 2012-10-31 MED ORDER — ONDANSETRON 4 MG PO TBDP: 4.0000 mg | ORAL_TABLET | Freq: Three times a day (TID) | ORAL | Status: DC | PRN

## 2012-10-31 MED ORDER — SODIUM CHLORIDE 0.9 % IV SOLN: INTRAVENOUS | Status: DC

## 2012-10-31 MED ORDER — IPRATROPIUM BROMIDE 0.02 % IN SOLN
0.5000 mg | Freq: Once | RESPIRATORY_TRACT | Status: AC
  Administered 2012-10-31: 0.5 mg via RESPIRATORY_TRACT
  Filled 2012-10-31: qty 2.5

## 2012-10-31 MED ORDER — ALBUTEROL SULFATE (5 MG/ML) 0.5% IN NEBU
2.5000 mg | INHALATION_SOLUTION | Freq: Once | RESPIRATORY_TRACT | Status: AC
  Administered 2012-10-31: 2.5 mg via RESPIRATORY_TRACT
  Filled 2012-10-31: qty 0.5

## 2012-10-31 NOTE — ED Notes (Signed)
Paged RT.

## 2012-10-31 NOTE — ED Provider Notes (Signed)
History  This chart was scribed for Chelsea Jakes, MD by Bennett Scrape, ED Scribe. This patient was seen in room APA06/APA06 and the patient's care was started at 9:29 AM.  CSN: [id number]  Arrival date & time 10/31/12  0917   First MD Initiated Contact with Patient 10/31/12 [phone]      Chief Complaint  Patient presents with  . Asthma     Patient is a 42 y.o. female presenting with shortness of breath. The history is provided by the patient. No language interpreter was used.  Shortness of Breath Severity:  Mild Onset quality:  Gradual Duration:  6 hours Timing:  Constant Progression:  Worsening Chronicity:  Recurrent Relieved by:  Inhaler Worsened by:  Exertion Associated symptoms: abdominal pain (radiation from the back), chest pain (mild, related to indigestion), headaches (related to migraine for the past 2 days), neck pain (chronic), vomiting and wheezing   Associated symptoms: no cough, no fever and no sore throat     [name] is a 42 y.o. female with a h/o asthma who presents to the Emergency Department complaining of 6 hours of gradual onset, gradually worsening, constant SOB with associated wheezing. She reports that she uses albuterol inhalers and a nebulizer at home. She reports that she tried both with improvement. She also c/o 2 days of back pain described as cramping that radiates into the epigastric abdominal pain with associated chills, nausea and emesis that started last night. She rates her pain a 7 out of 10 currently. She has a h/o pancreatitis and reports similarities with prior episodes. She denies knowing the cause of her pancreatitis. She denies hematemesis, sore throat, fever and cough as associated symptoms. She is a current everyday smoker but denies alcohol use.   PCP is Dr. Sherwood Gambler.  Past Medical History  Diagnosis Date  . Asthma   . Depression   . Migraine   . Pancreatitis chronic Idiopathic    Treated by Dr. Andrey Campanile at [facility]  in the past with celiac blocks.  . Bipolar 1 disorder   . Hyperlipidemia 02/03/2012  . Diabetes mellitus   . COPD (chronic obstructive pulmonary disease)   . Neck pain 06/08/2012    Past Surgical History  Procedure Laterality Date  . Cholecystectomy    . Tonsillectomy    . Abdominal hysterectomy      Family History  Problem Relation Age of Onset  . Asthma Other   . Coronary artery disease Neg Hx     History  Substance Use Topics  . Smoking status: Current Every Day Smoker -- 0.25 packs/day for 15 years    Types: Cigarettes  . Smokeless tobacco: Former Neurosurgeon    Quit date: 03/05/2012  . Alcohol Use: No    No OB history provided.  Review of Systems  Constitutional: Positive for chills. Negative for fever.  HENT: Positive for neck pain (chronic). Negative for congestion and sore throat.   Eyes: Negative for visual disturbance.  Respiratory: Positive for shortness of breath and wheezing. Negative for cough.   Cardiovascular: Positive for chest pain (mild, related to indigestion) and leg swelling.  Gastrointestinal: Positive for nausea, vomiting and abdominal pain (radiation from the back). Negative for diarrhea.  Genitourinary: Negative for dysuria.  Musculoskeletal: Positive for myalgias and back pain (chronic and new).  Skin: Negative for pallor.  Neurological: Positive for headaches (related to migraine for the past 2 days).  Hematological: Does not bruise/bleed easily.  Psychiatric/Behavioral: Negative for confusion.    Allergies  Penicillins; Sulfa antibiotics; and Prednisone  Home Medications   Current Outpatient Rx  Name  Route  Sig  Dispense  Refill  . albuterol (PROVENTIL HFA;VENTOLIN HFA) 108 (90 BASE) MCG/ACT inhaler   Inhalation   Inhale 2 puffs into the lungs daily as needed. For shortness of breath         . albuterol (PROVENTIL) (2.5 MG/3ML) 0.083% nebulizer solution   Nebulization   Take 2.5 mg by nebulization every 6 (six) hours as needed. For  shortness of breath          . amLODipine (NORVASC) 10 MG tablet   Oral   Take 10 mg by mouth every morning.          . clonazePAM (KLONOPIN) 0.5 MG tablet   Oral   Take 0.5 mg by mouth 3 (three) times daily as needed. For anxiety         . docusate sodium (COLACE) 100 MG capsule   Oral   Take 100 mg by mouth at bedtime.          . gabapentin (NEURONTIN) 300 MG capsule   Oral   Take 600 mg by mouth 3 (three) times daily.          Marland Kitchen HYDROcodone-acetaminophen (NORCO) 10-325 MG per tablet   Oral   Take 1 tablet by mouth every 6 (six) hours as needed for pain.         Marland Kitchen omeprazole (PRILOSEC) 20 MG capsule   Oral   Take 20 mg by mouth 3 (three) times daily.           . ondansetron (ZOFRAN) 4 MG tablet   Oral   Take 1 tablet (4 mg total) by mouth every 8 (eight) hours as needed for nausea.   20 tablet   0   . oxyCODONE-acetaminophen (PERCOCET/ROXICET) 5-325 MG per tablet   Oral   Take 2 tablets by mouth every 4 (four) hours as needed for pain.   6 tablet   0   . sertraline (ZOLOFT) 25 MG tablet   Oral   Take 25 mg by mouth at bedtime.         Marland Kitchen azithromycin (ZITHROMAX Z-PAK) 250 MG tablet      2 tablets by mouth today, then one tablet daily for 4 days.   6 tablet   0   . HYDROcodone-acetaminophen (NORCO/VICODIN) 5-325 MG per tablet   Oral   Take 1-2 tablets by mouth every 6 (six) hours as needed for pain.   10 tablet   0   . ondansetron (ZOFRAN ODT) 4 MG disintegrating tablet   Oral   Take 1 tablet (4 mg total) by mouth every 8 (eight) hours as needed for nausea.   10 tablet   0     Triage Vitals: BP 147/88  Pulse 100  Temp(Src) 98.3 F (36.8 C) (Oral)  Resp 24  SpO2 100%  Physical Exam  Nursing note and vitals reviewed. Constitutional: She is oriented to person, place, and time. She appears well-developed and well-nourished. No distress.  HENT:  Head: Normocephalic and atraumatic.  Mouth/Throat: Oropharynx is clear and moist.   Moist MM  Eyes: Conjunctivae and EOM are normal. Pupils are equal, round, and reactive to light.  Neck: Neck supple. No tracheal deviation present.  Cardiovascular: Normal rate and regular rhythm.   No murmur heard. Pulmonary/Chest: Effort normal. No respiratory distress. She has wheezes (mild wheezing bilaterally). She has no rales.  Mild rhonchi heard posteriorly  Abdominal: Soft. Bowel sounds are normal. She exhibits no distension. There is tenderness (mild tenderness in the epigastric region). There is no rebound and no guarding.  Musculoskeletal: Normal range of motion. She exhibits no edema (no ankle swelling) and no tenderness (no calf tenderness).  Lymphadenopathy:    She has no cervical adenopathy.  Neurological: She is alert and oriented to person, place, and time. No cranial nerve deficit or sensory deficit.  Pt able to move both sets of fingers and toes  Skin: Skin is warm and dry. No rash noted.  Psychiatric: She has a normal mood and affect. Her behavior is normal.    ED Course  Procedures (including critical care time)  DIAGNOSTIC STUDIES: Oxygen Saturation is 100% on room air, normal by my interpretation.    COORDINATION OF CARE: 9:42 AM-Discussed treatment plan which includes breathing treatment with pt at bedside and pt agreed to plan.   9:45 AM- Ordered 2.5 mg of 0.5% albuterol nebulizer solution and 0.5 mg of Atrovent solution.  10:00 AM- Ordered 250 mL of bolus, 1 mg dilaudid injection and 4 mg Zofran injection  11:15 AM-Ordered 1 mg dilaudid injection and 4 mg Zofran injection  Labs Reviewed  CBC WITH DIFFERENTIAL - Abnormal; Notable for the following:    Hemoglobin 11.0 (*)    HCT 34.1 (*)    All other components within normal limits  COMPREHENSIVE METABOLIC PANEL - Abnormal; Notable for the following:    Glucose, Bld 126 (*)    All other components within normal limits  URINALYSIS, ROUTINE W REFLEX MICROSCOPIC  LIPASE, BLOOD  TROPONIN I   Dg Abd  Acute W/chest  10/31/2012  *RADIOLOGY REPORT*  Clinical Data: Abdominal pain for 2 days.  Shortness of breath. History of asthma and pancreatitis.  ACUTE ABDOMEN SERIES (ABDOMEN 2 VIEW & CHEST 1 VIEW)  Comparison: Chest radiographs 10/17/2012.  Acute abdominal series 07/26/2012.  Findings: There is mild chronic central airway thickening.  The basilar aeration has improved.  There is no airspace disease, edema or pleural effusion.  Heart size and mediastinal contours are stable.  The bowel gas pattern is normal.  Multiple ingested pill fragments are demonstrated throughout the colon.  There are surgical clips in the right upper quadrant consistent with prior cholecystectomy. There is no evidence of free intraperitoneal air or suspicious calcification.  IMPRESSION: No acute cardiopulmonary or abdominal process identified.   Original Report Authenticated By: Carey Bullocks, M.D.      Date: 10/31/2012  Rate: 92  Rhythm: normal sinus rhythm  QRS Axis: normal  Intervals: normal  ST/T Wave abnormalities: normal  Conduction Disutrbances:right bundle branch block  Narrative Interpretation:   Old EKG Reviewed: unchanged No change in EKG compared to every 25th 2014  Results for orders placed during the hospital encounter of 10/31/12  CBC WITH DIFFERENTIAL      Result Value Range   WBC 8.4  4.0 - 10.5 K/uL   RBC 3.90  3.87 - 5.11 MIL/uL   Hemoglobin 11.0 (*) 12.0 - 15.0 g/dL   HCT 56.2 (*) 13.0 - 86.5 %   MCV 87.4  78.0 - 100.0 fL   MCH 28.2  26.0 - 34.0 pg   MCHC 32.3  30.0 - 36.0 g/dL   RDW 78.4  69.6 - 29.5 %   Platelets 228  150 - 400 K/uL   Neutrophils Relative 63  43 - 77 %   Neutro Abs 5.2  1.7 - 7.7 K/uL   Lymphocytes Relative 25  12 -  46 %   Lymphs Abs 2.1  0.7 - 4.0 K/uL   Monocytes Relative 7  3 - 12 %   Monocytes Absolute 0.6  0.1 - 1.0 K/uL   Eosinophils Relative 5  0 - 5 %   Eosinophils Absolute 0.4  0.0 - 0.7 K/uL   Basophils Relative 0  0 - 1 %   Basophils Absolute 0.0  0.0 -  0.1 K/uL  URINALYSIS, ROUTINE W REFLEX MICROSCOPIC      Result Value Range   Color, Urine YELLOW  YELLOW   APPearance CLEAR  CLEAR   Specific Gravity, Urine 1.015  1.005 - 1.030   pH 5.5  5.0 - 8.0   Glucose, UA NEGATIVE  NEGATIVE mg/dL   Hgb urine dipstick NEGATIVE  NEGATIVE   Bilirubin Urine NEGATIVE  NEGATIVE   Ketones, ur NEGATIVE  NEGATIVE mg/dL   Protein, ur NEGATIVE  NEGATIVE mg/dL   Urobilinogen, UA 0.2  0.0 - 1.0 mg/dL   Nitrite NEGATIVE  NEGATIVE   Leukocytes, UA NEGATIVE  NEGATIVE  COMPREHENSIVE METABOLIC PANEL      Result Value Range   Sodium 137  135 - 145 mEq/L   Potassium 3.9  3.5 - 5.1 mEq/L   Chloride 100  96 - 112 mEq/L   CO2 27  19 - 32 mEq/L   Glucose, Bld 126 (*) 70 - 99 mg/dL   BUN 18  6 - 23 mg/dL   Creatinine, Ser 1.61  0.50 - 1.10 mg/dL   Calcium 9.4  8.4 - 09.6 mg/dL   Total Protein 6.6  6.0 - 8.3 g/dL   Albumin 3.5  3.5 - 5.2 g/dL   AST 15  0 - 37 U/L   ALT 18  0 - 35 U/L   Alkaline Phosphatase 74  39 - 117 U/L   Total Bilirubin 0.3  0.3 - 1.2 mg/dL   GFR calc non Af Amer >90  >90 mL/min   GFR calc Af Amer >90  >90 mL/min  LIPASE, BLOOD      Result Value Range   Lipase 15  11 - 59 U/L  TROPONIN I      Result Value Range   Troponin I <0.30  <0.30 ng/mL     1. Asthma exacerbation   2. Abdominal pain       MDM  Today's workup without evidence of pancreatitis. Lipase is normal liver function test are normal. White count is not elevated. X-rays of the chest are negative for pneumonia or other abnormalities. Abd films without any acute changes. Patient's wheezing resolved with albuterol Atrovent nebulizer here. Patient's pain and nausea improved. Patient will followup with her primary care Dr. in the next few days. Will discharge home with antinausea medicine and pain medicine.      I personally performed the services described in this documentation, which was scribed in my presence. The recorded information has been reviewed and is  accurate.      Chelsea Jakes, MD 10/31/12 1150

## 2012-10-31 NOTE — ED Notes (Signed)
Pt c/o SOB. Used inhaler twice today with no relief. Wheezing noted inner bil upper lobes/rt lower. Pt c/o pain to rt side and lower abd. Nausea/vomiting yesterday. Hx of pancreatitis. States "this is what I feel like when I have a flair up of my pancreatitis".

## 2012-11-03 ENCOUNTER — Ambulatory Visit (INDEPENDENT_AMBULATORY_CARE_PROVIDER_SITE_OTHER): Payer: Medicare Other | Admitting: Otolaryngology

## 2012-11-03 DIAGNOSIS — IMO0001 Reserved for inherently not codable concepts without codable children: Secondary | ICD-10-CM | POA: Diagnosis not present

## 2012-11-03 DIAGNOSIS — H9209 Otalgia, unspecified ear: Secondary | ICD-10-CM | POA: Diagnosis not present

## 2012-11-03 DIAGNOSIS — M545 Low back pain: Secondary | ICD-10-CM | POA: Diagnosis not present

## 2012-11-03 DIAGNOSIS — H7200 Central perforation of tympanic membrane, unspecified ear: Secondary | ICD-10-CM

## 2012-11-03 DIAGNOSIS — M542 Cervicalgia: Secondary | ICD-10-CM | POA: Diagnosis not present

## 2012-11-03 DIAGNOSIS — H903 Sensorineural hearing loss, bilateral: Secondary | ICD-10-CM | POA: Diagnosis not present

## 2012-11-03 DIAGNOSIS — M546 Pain in thoracic spine: Secondary | ICD-10-CM | POA: Diagnosis not present

## 2012-11-14 DIAGNOSIS — J449 Chronic obstructive pulmonary disease, unspecified: Secondary | ICD-10-CM | POA: Diagnosis not present

## 2012-11-14 DIAGNOSIS — F411 Generalized anxiety disorder: Secondary | ICD-10-CM | POA: Diagnosis present

## 2012-11-14 DIAGNOSIS — J4489 Other specified chronic obstructive pulmonary disease: Secondary | ICD-10-CM | POA: Diagnosis not present

## 2012-11-14 DIAGNOSIS — F17200 Nicotine dependence, unspecified, uncomplicated: Secondary | ICD-10-CM | POA: Diagnosis not present

## 2012-11-14 DIAGNOSIS — R112 Nausea with vomiting, unspecified: Secondary | ICD-10-CM | POA: Diagnosis not present

## 2012-11-14 DIAGNOSIS — M4802 Spinal stenosis, cervical region: Secondary | ICD-10-CM | POA: Diagnosis not present

## 2012-11-14 DIAGNOSIS — Z888 Allergy status to other drugs, medicaments and biological substances status: Secondary | ICD-10-CM | POA: Diagnosis not present

## 2012-11-14 DIAGNOSIS — I10 Essential (primary) hypertension: Secondary | ICD-10-CM | POA: Diagnosis not present

## 2012-11-14 DIAGNOSIS — F329 Major depressive disorder, single episode, unspecified: Secondary | ICD-10-CM | POA: Diagnosis present

## 2012-11-14 DIAGNOSIS — K209 Esophagitis, unspecified without bleeding: Secondary | ICD-10-CM | POA: Diagnosis not present

## 2012-11-14 DIAGNOSIS — Z880 Allergy status to penicillin: Secondary | ICD-10-CM | POA: Diagnosis not present

## 2012-11-14 DIAGNOSIS — Z882 Allergy status to sulfonamides status: Secondary | ICD-10-CM | POA: Diagnosis not present

## 2012-11-14 DIAGNOSIS — G609 Hereditary and idiopathic neuropathy, unspecified: Secondary | ICD-10-CM | POA: Diagnosis not present

## 2012-11-14 DIAGNOSIS — D62 Acute posthemorrhagic anemia: Secondary | ICD-10-CM | POA: Diagnosis not present

## 2012-11-14 DIAGNOSIS — Z79899 Other long term (current) drug therapy: Secondary | ICD-10-CM | POA: Diagnosis not present

## 2012-11-14 DIAGNOSIS — K920 Hematemesis: Secondary | ICD-10-CM | POA: Diagnosis not present

## 2012-11-14 DIAGNOSIS — K922 Gastrointestinal hemorrhage, unspecified: Secondary | ICD-10-CM | POA: Diagnosis not present

## 2012-11-14 DIAGNOSIS — K449 Diaphragmatic hernia without obstruction or gangrene: Secondary | ICD-10-CM | POA: Diagnosis not present

## 2012-11-16 HISTORY — PX: ESOPHAGOGASTRODUODENOSCOPY: SHX1529

## 2012-11-17 ENCOUNTER — Encounter: Payer: Self-pay | Admitting: Internal Medicine

## 2012-11-21 DIAGNOSIS — M542 Cervicalgia: Secondary | ICD-10-CM | POA: Diagnosis not present

## 2012-11-21 DIAGNOSIS — M546 Pain in thoracic spine: Secondary | ICD-10-CM | POA: Diagnosis not present

## 2012-11-21 DIAGNOSIS — IMO0001 Reserved for inherently not codable concepts without codable children: Secondary | ICD-10-CM | POA: Diagnosis not present

## 2012-11-21 DIAGNOSIS — M545 Low back pain: Secondary | ICD-10-CM | POA: Diagnosis not present

## 2012-11-22 ENCOUNTER — Encounter: Payer: Self-pay | Admitting: Gastroenterology

## 2012-11-22 ENCOUNTER — Ambulatory Visit (INDEPENDENT_AMBULATORY_CARE_PROVIDER_SITE_OTHER): Payer: Medicare Other | Admitting: Gastroenterology

## 2012-11-22 ENCOUNTER — Ambulatory Visit (HOSPITAL_COMMUNITY)
Admission: RE | Admit: 2012-11-22 | Discharge: 2012-11-22 | Disposition: A | Discharge status: Home / Self Care | Payer: Medicare Other | Source: Ambulatory Visit | Attending: Gastroenterology | Admitting: Gastroenterology

## 2012-11-22 VITALS — BP 131/88 | HR 100 | Temp 98.4°F | Ht 64.0 in | Wt 169.4 lb

## 2012-11-22 DIAGNOSIS — R928 Other abnormal and inconclusive findings on diagnostic imaging of breast: Secondary | ICD-10-CM | POA: Diagnosis not present

## 2012-11-22 DIAGNOSIS — K859 Acute pancreatitis without necrosis or infection, unspecified: Secondary | ICD-10-CM | POA: Diagnosis not present

## 2012-11-22 DIAGNOSIS — Z0389 Encounter for observation for other suspected diseases and conditions ruled out: Secondary | ICD-10-CM | POA: Diagnosis not present

## 2012-11-22 DIAGNOSIS — K5900 Constipation, unspecified: Secondary | ICD-10-CM | POA: Diagnosis not present

## 2012-11-22 DIAGNOSIS — Z09 Encounter for follow-up examination after completed treatment for conditions other than malignant neoplasm: Secondary | ICD-10-CM | POA: Diagnosis not present

## 2012-11-22 DIAGNOSIS — R922 Inconclusive mammogram: Secondary | ICD-10-CM | POA: Diagnosis not present

## 2012-11-22 LAB — COMPREHENSIVE METABOLIC PANEL
AST: 15 U/L (ref 0–37)
Albumin: 3.8 g/dL (ref 3.5–5.2)
BUN: 8 mg/dL (ref 6–23)
Calcium: 9.6 mg/dL (ref 8.4–10.5)
Chloride: 103 mEq/L (ref 96–112)
Creat: 0.6 mg/dL (ref 0.50–1.10)
Glucose, Bld: 139 mg/dL — ABNORMAL HIGH (ref 70–99)
Potassium: 4 mEq/L (ref 3.5–5.3)
Total Bilirubin: 0.3 mg/dL (ref 0.3–1.2)

## 2012-11-22 LAB — CBC WITH DIFFERENTIAL/PLATELET
Basophils Absolute: 0 10*3/uL (ref 0.0–0.1)
Eosinophils Absolute: 0.1 10*3/uL (ref 0.0–0.7)
Eosinophils Relative: 1 % (ref 0–5)
Hemoglobin: 10 g/dL — ABNORMAL LOW (ref 12.0–15.0)
Lymphs Abs: 1.2 10*3/uL (ref 0.7–4.0)
MCH: 26.1 pg (ref 26.0–34.0)
MCV: 80.7 fL (ref 78.0–100.0)
Monocytes Relative: 4 % (ref 3–12)
Neutro Abs: 6.9 10*3/uL (ref 1.7–7.7)
RBC: 3.83 MIL/uL — ABNORMAL LOW (ref 3.87–5.11)

## 2012-11-22 MED ORDER — LINACLOTIDE 290 MCG PO CAPS: 1.0000 | ORAL_CAPSULE | Freq: Every day | ORAL | Status: DC

## 2012-11-22 MED ORDER — PANCRELIPASE (LIP-PROT-AMYL) 20000-68000 UNITS PO CPEP: 2.0000 | ORAL_CAPSULE | Freq: Three times a day (TID) | ORAL | Status: DC

## 2012-11-22 MED ORDER — ONDANSETRON HCL 4 MG PO TABS: 4.0000 mg | ORAL_TABLET | Freq: Three times a day (TID) | ORAL | Status: DC | PRN

## 2012-11-22 NOTE — Progress Notes (Signed)
Primary Care Physician:  Cassell Smiles., MD Primary Gastroenterologist:  Dr. Jena Gauss   Chief Complaint  Patient presents with  . Nausea    HPI:   42 year old female presenting today with a history of idiopathic pancreatitis, recently discharged from [facility] after admission for hematemesis. EGD performed by Dr. Teena Dunk April 2014 with a hiatal hernia and esophagitis. CT on admission showed small hiatal hernia with questionable wall thickening of distal esophagus/GE junction. Slightly increased stool throughout colon with A QUESTION OF FOREIGN BODIES.  She was last seen by [place] several years ago and underwent a colonoscopy by Dr. Jena Gauss 2009. This showed friable anal canal, otherwise normal. History of chronic anemia.  Notes N/V X 3 weeks. Pain in back from prior injuries. Migraines: takes BC powders prn. Took 4 the week before admission to [facility]. Notes epigastric pain, worse after eating. Vague lower abdominal discomfort. Had melena before admission to [facility]. Scant paper hematochezia with constipation. +reflux. Prilosec 20 mg TID. No pancreatic enzymes. Seems like she took this in the past. Noted improvement with this in the remote past. No further hematemesis. BM once a week.   Last visit with [facility] about a year ago. Has had a celiac block in past due to chronic pancreatitis.   Past Medical History  Diagnosis Date  . Asthma   . Depression   . Migraine   . Pancreatitis chronic Idiopathic    Treated by Dr. Margaretha Glassing at [facility] in the past with celiac blocks.  . Bipolar 1 disorder   . Hyperlipidemia 02/03/2012  . Diabetes mellitus   . COPD (chronic obstructive pulmonary disease)   . Neck pain 06/08/2012    Past Surgical History  Procedure Laterality Date  . Cholecystectomy    . Tonsillectomy    . Abdominal hysterectomy    . Esophagogastroduodenoscopy  02/20/2008    ONG:[id number] distal esophageal mucosa, suspicious for neoplasm/Hiatal hernia otherwise normal   .  Esophagogastroduodenoscopy  04/03/2008    GMW:[id number] hiatal hernia, otherwise normal/Short, tight, benign-appearing peptic stricture  . Ileocolonoscopy  06/05/2008      KVQ:[id number] anal canal, otherwise normal rectum, colon  . Esophagogastroduodenoscopy  November 16, 2012    Dr. Teena Dunk: hiatal hernia, esophagitis,     Current Outpatient Prescriptions  Medication Sig Dispense Refill  . albuterol (PROVENTIL HFA;VENTOLIN HFA) 108 (90 BASE) MCG/ACT inhaler Inhale 2 puffs into the lungs daily as needed. For shortness of breath      . albuterol (PROVENTIL) (2.5 MG/3ML) 0.083% nebulizer solution Take 2.5 mg by nebulization every 6 (six) hours as needed. For shortness of breath       . amLODipine (NORVASC) 10 MG tablet Take 10 mg by mouth every morning.       . clonazePAM (KLONOPIN) 0.5 MG tablet Take 0.5 mg by mouth 3 (three) times daily as needed. For anxiety      . docusate sodium (COLACE) 100 MG capsule Take 100 mg by mouth at bedtime.       . gabapentin (NEURONTIN) 300 MG capsule Take 600 mg by mouth 3 (three) times daily.       Marland Kitchen omeprazole (PRILOSEC) 20 MG capsule Take 20 mg by mouth 3 (three) times daily.        . sertraline (ZOLOFT) 25 MG tablet Take 25 mg by mouth at bedtime.      . Linaclotide 290 MCG CAPS Take 1 capsule by mouth daily.  30 capsule  3  . ondansetron (ZOFRAN) 4 MG tablet Take 1 tablet (4  mg total) by mouth every 8 (eight) hours as needed for nausea.  30 tablet  1  . Pancrelipase, Lip-Prot-Amyl, (ZENPEP) 20000 UNITS CPEP Take 2 capsules (40,000 Units total) by mouth 3 (three) times daily with meals. 1 with snacks  240 capsule  3   No current facility-administered medications for this visit.    Allergies as of 11/22/2012 - Review Complete 11/22/2012  Allergen Reaction Noted  . Penicillins Shortness Of Breath   . Sulfa antibiotics Shortness Of Breath 04/21/2011  . Prednisone Other (See Comments) 07/14/2011    Family History  Problem Relation Age of Onset  .  Asthma Other   . Coronary artery disease Neg Hx   . Colon cancer Neg Hx     History   Social History  . Marital Status: Legally Separated    Spouse Name: N/A    Number of Children: N/A  . Years of Education: N/A   Occupational History  . Not on file.   Social History Main Topics  . Smoking status: Current Every Day Smoker -- 0.25 packs/day for 15 years    Types: Cigarettes  . Smokeless tobacco: Former Neurosurgeon    Quit date: 03/05/2012  . Alcohol Use: No  . Drug Use: No  . Sexually Active: Yes    Birth Control/ Protection: Surgical   Other Topics Concern  . Not on file   Social History Narrative  . No narrative on file    Review of Systems: Negative unless mentioned in HPI.   Physical Exam: BP 131/88  Pulse 100  Temp(Src) 98.4 F (36.9 C) (Oral)  Ht 5\' 4"  (1.626 m)  Wt 169 lb 6.4 oz (76.839 kg)  BMI 29.06 kg/m2 General:   Alert and oriented. Flat affect, appears older than stated age. Head:  Normocephalic and atraumatic. Eyes:  Without icterus, sclera clear and conjunctiva pink.  Ears:  Normal auditory acuity. Nose:  No deformity, discharge,  or lesions. Mouth:  No deformity or lesions, oral mucosa pink.  Neck:  Supple, without mass or thyromegaly. Lungs:  Clear to auscultation bilaterally. No wheezes, rales, or rhonchi. No distress.  Heart:  S1, S2 present without murmurs appreciated.  Abdomen:  +BS, soft, TTP epigastric region and non-distended. No HSM noted. No guarding or rebound. No masses appreciated.  Rectal:  Deferred  Msk:  Symmetrical without gross deformities. Normal posture. Extremities:  Without clubbing or edema. Neurologic:  Alert and  oriented x4;  grossly normal neurologically. Skin:  Intact without significant lesions or rashes. Cervical Nodes:  No significant cervical adenopathy. Psych:  Alert and cooperative. Normal mood and affect.      Marland Kitchen

## 2012-11-22 NOTE — Patient Instructions (Signed)
Please have xray done and and blood work today. We will call with results.  I have sent in prescription for more Zofran for your nausea.   For your pancreas: Start taking Zenpep 2 capsules with food, three times a day. Take 1 capsule with snacks. We have provided samples and a savings card.  I have also provided samples of Linzess for constipation. DO NOT TAKE THIS until after your xray is reviewed by [place] and we talk to you.   Seek medical attention if you have any worsening abdominal pain.

## 2012-11-23 ENCOUNTER — Encounter: Payer: Self-pay | Admitting: Gastroenterology

## 2012-11-23 DIAGNOSIS — Z6829 Body mass index (BMI) 29.0-29.9, adult: Secondary | ICD-10-CM | POA: Diagnosis not present

## 2012-11-23 DIAGNOSIS — K449 Diaphragmatic hernia without obstruction or gangrene: Secondary | ICD-10-CM | POA: Diagnosis not present

## 2012-11-24 DIAGNOSIS — K859 Acute pancreatitis without necrosis or infection, unspecified: Secondary | ICD-10-CM | POA: Insufficient documentation

## 2012-11-24 NOTE — Assessment & Plan Note (Addendum)
Hx of chronic idiopathic pancreatitis, previously seen at [facility]. No visit in at least a year. She is no longer on pancreatic enzymes. Recent CT from [facility] (November 14, 2012) WITHOUT evidence of acute pancreatitis. EGD on file from Dr. Teena Dunk about a week ago with esophagitis, hiatal hernia. Due to continued abdominal pain, N/V, check stat labs. Start Zenpep 40,000 units with meals. Continue PPI. Zofran for nausea.

## 2012-11-24 NOTE — Progress Notes (Signed)
Quick Note:  Patient aware. ______ 

## 2012-11-24 NOTE — Progress Notes (Signed)
Cc PCP 

## 2012-11-24 NOTE — Assessment & Plan Note (Signed)
Chronic, TCS on file from 2009. Abd xray today due to question of foreign bodies on CT. Pt denies any ingestion. Once negative ABD xray, start Linzess.

## 2012-11-30 ENCOUNTER — Telehealth: Payer: Self-pay | Admitting: Internal Medicine

## 2012-11-30 DIAGNOSIS — J449 Chronic obstructive pulmonary disease, unspecified: Secondary | ICD-10-CM | POA: Diagnosis not present

## 2012-11-30 DIAGNOSIS — K2971 Gastritis, unspecified, with bleeding: Secondary | ICD-10-CM | POA: Diagnosis not present

## 2012-11-30 DIAGNOSIS — F17200 Nicotine dependence, unspecified, uncomplicated: Secondary | ICD-10-CM | POA: Diagnosis not present

## 2012-11-30 DIAGNOSIS — D649 Anemia, unspecified: Secondary | ICD-10-CM | POA: Diagnosis not present

## 2012-11-30 DIAGNOSIS — I10 Essential (primary) hypertension: Secondary | ICD-10-CM | POA: Diagnosis not present

## 2012-11-30 DIAGNOSIS — Z79899 Other long term (current) drug therapy: Secondary | ICD-10-CM | POA: Diagnosis not present

## 2012-11-30 MED ORDER — PANTOPRAZOLE SODIUM 40 MG PO TBEC: 40.0000 mg | DELAYED_RELEASE_TABLET | Freq: Two times a day (BID) | ORAL | Status: DC

## 2012-11-30 MED ORDER — PROMETHAZINE HCL 25 MG PO TABS: 25.0000 mg | ORAL_TABLET | Freq: Four times a day (QID) | ORAL | Status: DC | PRN

## 2012-11-30 NOTE — Telephone Encounter (Signed)
Pt called to make OV and is aware of OV on 5/19 at 10 with LSL. She said she was in the hospital recently for a bleeding hernia, pancreatitis and being amenic. She is complaining of stomach pains that radiated around to her back and wanted someone to advise her of what she could do. [phone]

## 2012-11-30 NOTE — Telephone Encounter (Signed)
Phenergan prescription sent to pharmacy.

## 2012-11-30 NOTE — Telephone Encounter (Signed)
Gastroenterology Phone Call Form  Primary Physician:    1) Reason for phone call: pt has increased pain in her upper abd that radiates around to her back. abd swollen and hard. Stomach feels sore. +nausea, zofran helps this some but not completely. zenpep not helping either. pts had a small bm yesterday but prior to that last bm was 1.5 weeks ago. linzess is not working. No blood in stool. No fever   2) When did this start? yesterday   3) Have you been seen for this here before?  yes by Tobi Bastos   4) If Pain, where is it located?  the RUQ   5) Rate your pain/discomfort on scale 1-10? 5   6) Have you tried any medication for this?yes taking linzess, zofran, and zenpep- all not helping.   7) Do you have any trouble breathing, chest pain, vomiting blood, passing bloody stools, feel dehydrated, dizzy or have a high fever?no     Appointment  Provider:  Tana Coast, PA Date: 12/12/12 Time: 10am

## 2012-11-30 NOTE — Telephone Encounter (Signed)
Pt is aware. Leighann please refer to Althea Grimmer, pt will need rx for phenergan

## 2012-11-30 NOTE — Addendum Note (Signed)
Addended by: Nira Retort on: 11/30/2012 04:48 PM   Modules accepted: Orders

## 2012-11-30 NOTE — Addendum Note (Signed)
Addended by: Nira Retort on: 11/30/2012 02:32 PM   Modules accepted: Orders, Medications

## 2012-11-30 NOTE — Telephone Encounter (Signed)
Sounds like she is constipated. She has a recent abd xray on file without concerning findings. CT April 21 WITHOUT pancreatitis. Lipase, Amylase, CMP all normal from April 29th.   I do note Hgb is 10.0, down a gram from a month ago, and in Feb 2014, her Hgb was normal. She does have a history of anemia. Looks like last colonoscopy was in Nov 2009, last EGD April 2014 by Dr. Teena Dunk.   I do not think we are dealing with a bowel obstruction. Likely significantly constipated.   PLAN: 1. Start Miralax 1 dose each hour up to 6 doses, or until she has a BM. Drink at full glass of water 30 minutes after each dose.   2. Start Linzess the day after she has a BM from the Miralax. Limit/avoid Zofran. This can cause constipation. Use phenergan instead.   3. Needs to be referred back to [facility] due to chronic pancreatitis.   4. Keep taking Zenpep.   5. I have changed her Protonix to 40 mg po BID. Seek medical attention if severe abdominal pain, unable to tolerate fluids.

## 2012-11-30 NOTE — Telephone Encounter (Signed)
Patient is scheduled at [facility] with Dr. Lanell Matar on May 13th at 12:30

## 2012-11-30 NOTE — Telephone Encounter (Signed)
PT CALLED AND SAID SHE IS HAVING LARGE AMOUNT OF BRBPR. PT INSTRUCTED TO GO TO NEAREST ED FOR AN EVALUATION.

## 2012-12-01 ENCOUNTER — Other Ambulatory Visit: Payer: Self-pay

## 2012-12-01 ENCOUNTER — Other Ambulatory Visit: Payer: Self-pay | Admitting: Gastroenterology

## 2012-12-01 ENCOUNTER — Encounter (HOSPITAL_COMMUNITY): Payer: Self-pay | Admitting: *Deleted

## 2012-12-01 ENCOUNTER — Other Ambulatory Visit: Payer: Self-pay | Admitting: Internal Medicine

## 2012-12-01 ENCOUNTER — Inpatient Hospital Stay (HOSPITAL_COMMUNITY)
Admission: EM | Admit: 2012-12-01 | Discharge: 2012-12-05 | DRG: 378 | Disposition: A | Discharge status: Home / Self Care | Payer: Medicare Other | Attending: Internal Medicine | Admitting: Internal Medicine

## 2012-12-01 DIAGNOSIS — R109 Unspecified abdominal pain: Secondary | ICD-10-CM | POA: Diagnosis not present

## 2012-12-01 DIAGNOSIS — G8929 Other chronic pain: Secondary | ICD-10-CM | POA: Diagnosis present

## 2012-12-01 DIAGNOSIS — J45909 Unspecified asthma, uncomplicated: Secondary | ICD-10-CM

## 2012-12-01 DIAGNOSIS — H6090 Unspecified otitis externa, unspecified ear: Secondary | ICD-10-CM

## 2012-12-01 DIAGNOSIS — K921 Melena: Secondary | ICD-10-CM

## 2012-12-01 DIAGNOSIS — Z79899 Other long term (current) drug therapy: Secondary | ICD-10-CM

## 2012-12-01 DIAGNOSIS — K222 Esophageal obstruction: Secondary | ICD-10-CM

## 2012-12-01 DIAGNOSIS — Z8719 Personal history of other diseases of the digestive system: Secondary | ICD-10-CM

## 2012-12-01 DIAGNOSIS — D62 Acute posthemorrhagic anemia: Secondary | ICD-10-CM

## 2012-12-01 DIAGNOSIS — D638 Anemia in other chronic diseases classified elsewhere: Secondary | ICD-10-CM | POA: Diagnosis present

## 2012-12-01 DIAGNOSIS — F319 Bipolar disorder, unspecified: Secondary | ICD-10-CM

## 2012-12-01 DIAGNOSIS — K625 Hemorrhage of anus and rectum: Secondary | ICD-10-CM | POA: Diagnosis not present

## 2012-12-01 DIAGNOSIS — K219 Gastro-esophageal reflux disease without esophagitis: Secondary | ICD-10-CM

## 2012-12-01 DIAGNOSIS — K861 Other chronic pancreatitis: Secondary | ICD-10-CM | POA: Diagnosis present

## 2012-12-01 DIAGNOSIS — D509 Iron deficiency anemia, unspecified: Secondary | ICD-10-CM

## 2012-12-01 DIAGNOSIS — I10 Essential (primary) hypertension: Secondary | ICD-10-CM

## 2012-12-01 DIAGNOSIS — K859 Acute pancreatitis without necrosis or infection, unspecified: Secondary | ICD-10-CM | POA: Diagnosis not present

## 2012-12-01 DIAGNOSIS — J9601 Acute respiratory failure with hypoxia: Secondary | ICD-10-CM

## 2012-12-01 DIAGNOSIS — M542 Cervicalgia: Secondary | ICD-10-CM

## 2012-12-01 DIAGNOSIS — R739 Hyperglycemia, unspecified: Secondary | ICD-10-CM

## 2012-12-01 DIAGNOSIS — Z720 Tobacco use: Secondary | ICD-10-CM

## 2012-12-01 DIAGNOSIS — G43909 Migraine, unspecified, not intractable, without status migrainosus: Secondary | ICD-10-CM

## 2012-12-01 DIAGNOSIS — K5900 Constipation, unspecified: Secondary | ICD-10-CM | POA: Diagnosis not present

## 2012-12-01 DIAGNOSIS — K8500 Idiopathic acute pancreatitis without necrosis or infection: Secondary | ICD-10-CM

## 2012-12-01 DIAGNOSIS — E785 Hyperlipidemia, unspecified: Secondary | ICD-10-CM

## 2012-12-01 DIAGNOSIS — R202 Paresthesia of skin: Secondary | ICD-10-CM

## 2012-12-01 DIAGNOSIS — T50905A Adverse effect of unspecified drugs, medicaments and biological substances, initial encounter: Secondary | ICD-10-CM

## 2012-12-01 DIAGNOSIS — J4489 Other specified chronic obstructive pulmonary disease: Secondary | ICD-10-CM | POA: Diagnosis present

## 2012-12-01 DIAGNOSIS — E876 Hypokalemia: Secondary | ICD-10-CM | POA: Diagnosis present

## 2012-12-01 DIAGNOSIS — K449 Diaphragmatic hernia without obstruction or gangrene: Secondary | ICD-10-CM

## 2012-12-01 DIAGNOSIS — E119 Type 2 diabetes mellitus without complications: Secondary | ICD-10-CM | POA: Diagnosis present

## 2012-12-01 DIAGNOSIS — M62838 Other muscle spasm: Secondary | ICD-10-CM

## 2012-12-01 DIAGNOSIS — J449 Chronic obstructive pulmonary disease, unspecified: Secondary | ICD-10-CM | POA: Diagnosis present

## 2012-12-01 DIAGNOSIS — R197 Diarrhea, unspecified: Secondary | ICD-10-CM

## 2012-12-01 DIAGNOSIS — F17200 Nicotine dependence, unspecified, uncomplicated: Secondary | ICD-10-CM | POA: Diagnosis present

## 2012-12-01 DIAGNOSIS — D649 Anemia, unspecified: Secondary | ICD-10-CM | POA: Diagnosis not present

## 2012-12-01 DIAGNOSIS — F411 Generalized anxiety disorder: Secondary | ICD-10-CM

## 2012-12-01 DIAGNOSIS — R1084 Generalized abdominal pain: Secondary | ICD-10-CM | POA: Diagnosis not present

## 2012-12-01 DIAGNOSIS — J45901 Unspecified asthma with (acute) exacerbation: Secondary | ICD-10-CM

## 2012-12-01 LAB — COMPREHENSIVE METABOLIC PANEL
AST: 25 U/L (ref 0–37)
Albumin: 3.6 g/dL (ref 3.5–5.2)
Alkaline Phosphatase: 83 U/L (ref 39–117)
BUN: 13 mg/dL (ref 6–23)
Calcium: 9.2 mg/dL (ref 8.4–10.5)
Chloride: 97 mEq/L (ref 96–112)
Creatinine, Ser: 0.74 mg/dL (ref 0.50–1.10)
GFR calc Af Amer: >90 mL/min (ref >90)
GFR calc non Af Amer: >90 mL/min (ref >90)
Glucose, Bld: 175 mg/dL — ABNORMAL HIGH (ref 70–99)
Potassium: 3.7 mEq/L (ref 3.5–5.1)
Total Bilirubin: 0.2 mg/dL — ABNORMAL LOW (ref 0.3–1.2)

## 2012-12-01 LAB — CBC WITH DIFFERENTIAL/PLATELET
Basophils Absolute: 0 10*3/uL (ref 0.0–0.1)
Basophils Relative: 0 % (ref 0–1)
Eosinophils Relative: 5 % (ref 0–5)
HCT: 28.4 % — ABNORMAL LOW (ref 36.0–46.0)
Lymphocytes Relative: 25 % (ref 12–46)
MCH: 25.1 pg — ABNORMAL LOW (ref 26.0–34.0)
MCHC: 31.3 g/dL (ref 30.0–36.0)
MCV: 80 fL (ref 78.0–100.0)
Monocytes Absolute: 0.8 10*3/uL (ref 0.1–1.0)
Monocytes Relative: 9 % (ref 3–12)
RDW: 14.4 % (ref 11.5–15.5)

## 2012-12-01 LAB — URINALYSIS, ROUTINE W REFLEX MICROSCOPIC
Bilirubin Urine: NEGATIVE
Glucose, UA: NEGATIVE mg/dL
Hgb urine dipstick: NEGATIVE
Ketones, ur: NEGATIVE mg/dL
Nitrite: NEGATIVE
Protein, ur: NEGATIVE mg/dL
pH: 6 (ref 5.0–8.0)

## 2012-12-01 LAB — SAMPLE TO BLOOD BANK

## 2012-12-01 LAB — LIPASE, BLOOD: Lipase: 59 U/L (ref 11–59)

## 2012-12-01 MED ORDER — FENTANYL CITRATE 0.05 MG/ML IJ SOLN
50.0000 ug | Freq: Once | INTRAMUSCULAR | Status: AC
  Administered 2012-12-01: 50 ug via INTRAVENOUS
  Filled 2012-12-01: qty 2

## 2012-12-01 MED ORDER — METOCLOPRAMIDE HCL 5 MG/ML IJ SOLN
10.0000 mg | Freq: Once | INTRAMUSCULAR | Status: AC
  Administered 2012-12-01: 10 mg via INTRAVENOUS
  Filled 2012-12-01: qty 2

## 2012-12-01 MED ORDER — SODIUM CHLORIDE 0.9 % IV BOLUS (SEPSIS)
1000.0000 mL | Freq: Once | INTRAVENOUS | Status: AC
  Administered 2012-12-01: 1000 mL via INTRAVENOUS

## 2012-12-01 MED ORDER — DIPHENHYDRAMINE HCL 50 MG/ML IJ SOLN
25.0000 mg | Freq: Once | INTRAMUSCULAR | Status: AC
  Administered 2012-12-01: 25 mg via INTRAVENOUS
  Filled 2012-12-01: qty 1

## 2012-12-01 MED ORDER — PEG 3350-KCL-NA BICARB-NACL 420 G PO SOLR: 4000.0000 mL | ORAL | Status: DC

## 2012-12-01 NOTE — ED Notes (Signed)
Pt is requesting pain meds at this time states that her pain is about a 5 in her stomach. [name]

## 2012-12-01 NOTE — Telephone Encounter (Signed)
I have reviewed her labs.  It is concerning that as of Feb 2014, her Hgb was normal at 12.7, with now outside labs at [facility] noting Hgb 8.6. See trend of labs below.   She had an EGD by Dr. Teena Dunk April 2014 that reported only a hiatal hernia and esophagitis. Last colonoscopy by Dr. Jena Gauss 2009 with friable anal canal, otherwise normal. Although she has a history of chronic anemia, she has had significant drops in her Hgb with evidence of bright red blood per rectum. She needs a colonoscopy at the minimum with +/- EGD at time of colonoscopy. Could be dealing with rapid transit bleed but less likely.    Need to do the following: 1. Get progress report regarding any further rectal bleeding. IF SHE HAS FURTHER/CONTINUED BLEEDING, SHE NEEDS TO COME TO OUR ED NOT [facility].   2. IF NO bleeding, and it has resolved, need CBC. If bleeding has resolved, can do CBC tomorrow, but it needs to be done so we can see if stable.   3. Need to be set up for TCS+/-EGD with RMR early next week (Monday if at all possible) if she has no signs of active bleeding right now (review of CBC will help decide urgency of this).   4. It is extremely important that if it is after hours and she has further evidence of OVERT bleeding, she needs to come to OUR ED ASAP. Other signs/symptoms of concern include worsening abdominal pain, vomiting blood, severe abdominal pain, weakness, severe lethargy.   I won't be here tomorrow, so someone will need to address the CBC in my absence.   Trend in Hgb:  06/2012: 13.31 Aug 2011: 12.7 April 7: 11 April 29: 10 May 7: 8.6

## 2012-12-01 NOTE — Telephone Encounter (Signed)
Patient is scheduled for TCS+/-EGD w/RMR for Thursday May 15th I have mailed her the instructions and I have LMOM for her to call be back to confirm date & time

## 2012-12-01 NOTE — ED Notes (Signed)
Pt was dizzy from lying to setting and setting to standing. [name] 

## 2012-12-01 NOTE — Telephone Encounter (Signed)
Pt called- she went to [facility] ED last night. She took the miralax and instructed and had 1 small bm with a lot of BRBPR. Pt stated ED only checked her blood work. Her hemoglobin is down to 8.6. Pt is concerned and wants to know what she should do. Please advise.

## 2012-12-01 NOTE — ED Notes (Signed)
Bright red rectal bleeding that started yesterday with abd cramping.  C/o nausea, denies vomiting.

## 2012-12-01 NOTE — ED Notes (Signed)
Pt states spoke to her dr today about bleeding from rectum that started yesterday. Pt also reports abdominal swelling. Pt is scheduled for a colonoscopy on the 15th.

## 2012-12-01 NOTE — ED Provider Notes (Signed)
History  This chart was scribed for Ward Givens, MD by Bennett Scrape, ED Scribe. This patient was seen in room APA12/APA12 and the patient's care was started at 9:13 PM.  CSN: [id number]  Arrival date & time 12/01/12  1745   First MD Initiated Contact with Patient 12/01/12 2016      Chief Complaint  Patient presents with  . Rectal Bleeding    Patient is a 42 y.o. female presenting with hematochezia. The history is provided by the patient and a relative. No language interpreter was used.  Rectal Bleeding  The current episode started more than 2 weeks ago. The onset was sudden. The problem occurs occasionally. The problem has been gradually improving. The pain is mild. The stool is described as mixed with blood. Associated symptoms include abdominal pain, nausea and vomiting (now resolved). Pertinent negatives include no fever, no diarrhea and no chest pain. She has been eating and drinking normally. Urine output has been normal. Her past medical history does not include recent abdominal injury, recent antibiotic use, recent change in diet or a recent illness. There were no sick contacts. Recently, medical care has been given at another facility.    HPI Comments: [name] is a 42 y.o. female who presents to the Emergency Department complaining of 2 episodes of bright red rectal bleeding described as in the toilet and on the toilet paper when wiping with associated generalized abdominal cramping, nausea and one episode of lightheadedness like she was going to pass out that started yesterday around 5PM. She reports one yesterday and one today and states that yesterday was worse than today's episode. She reports emesis but states that the last episode was 3 days ago. She denies any suspect or inappropriate foods or recent alcohol intake. She states that she used miralax for constipation "every hour" yesterday but denies passing any stool. She reports that this has been an intermittent  ongoing problem for the past 3 weeks. She was admitted to  [facility] for 3 days about 3 weeks ago when she started vomiting blood. She states that she had an EGD which showed that her hiatal hernia was bleeding. She was discharged and followed up with Dr. Kendell Bane one week later (2 weeks ago from today) with normal blood work. She was put on "pig enzymes for pancreatitis" during that visit and denies any further episodes until yesterday. She reports that she called the doctor on call yesterday after the first episode and had the prilosec changed to protonix. She denies CP, fever, chills and diarrhea as associated symptoms.  She has a h/o asthma, depression, chronic pancreatitis, DM, HLD and COPD. Pt is a current 1.0 ppd everyday smoker but denies alcohol use.  PCP is Dr. Sherwood Gambler  Past Medical History  Diagnosis Date  . Asthma   . Depression   . Migraine   . Pancreatitis chronic Idiopathic    Treated by Dr. Margaretha Glassing at [facility] in the past with celiac blocks.  . Bipolar 1 disorder   . Hyperlipidemia 02/03/2012  . Diabetes mellitus   . COPD (chronic obstructive pulmonary disease)   . Neck pain 06/08/2012    Past Surgical History  Procedure Laterality Date  . Cholecystectomy    . Tonsillectomy    . Abdominal hysterectomy    . Esophagogastroduodenoscopy  02/20/2008    WUJ:[id number] distal esophageal mucosa, suspicious for neoplasm/Hiatal hernia otherwise normal   . Esophagogastroduodenoscopy  04/03/2008    [id number]:[id number] hiatal hernia, otherwise normal/Short, tight, benign-appearing peptic  stricture  . Ileocolonoscopy  06/05/2008      ZOX:WRUEAVW anal canal, otherwise normal rectum, colon  . Esophagogastroduodenoscopy  November 16, 2012    Dr. Teena Dunk: hiatal hernia, esophagitis,     Family History  Problem Relation Age of Onset  . Asthma Other   . Coronary artery disease Neg Hx   . Colon cancer Neg Hx     History  Substance Use Topics  . Smoking status: Current Every  Day Smoker -- 1.00 packs/day for 15 years    Types: Cigarettes  . Smokeless tobacco: Former Neurosurgeon  . Alcohol Use: No  on disability for pancreatitis, depression and chronic back pain  No OB history provided.  Review of Systems  Constitutional: Negative for fever and chills.  Respiratory: Negative for shortness of breath.   Cardiovascular: Negative for chest pain.  Gastrointestinal: Positive for nausea, vomiting (now resolved), abdominal pain, hematochezia and anal bleeding. Negative for diarrhea.  All other systems reviewed and are negative.    Allergies  Penicillins; Sulfa antibiotics; and Prednisone  Home Medications   Current Outpatient Rx  Name  Route  Sig  Dispense  Refill  . albuterol (PROVENTIL HFA;VENTOLIN HFA) 108 (90 BASE) MCG/ACT inhaler   Inhalation   Inhale 2 puffs into the lungs daily as needed. For shortness of breath         . albuterol (PROVENTIL) (2.5 MG/3ML) 0.083% nebulizer solution   Nebulization   Take 2.5 mg by nebulization every 6 (six) hours as needed. For shortness of breath          . amLODipine (NORVASC) 10 MG tablet   Oral   Take 10 mg by mouth every morning.          . clonazePAM (KLONOPIN) 0.5 MG tablet   Oral   Take 0.5 mg by mouth 3 (three) times daily as needed. For anxiety         . docusate sodium (COLACE) 100 MG capsule   Oral   Take 100 mg by mouth at bedtime.          . gabapentin (NEURONTIN) 300 MG capsule   Oral   Take 600 mg by mouth 3 (three) times daily.          . Linaclotide 290 MCG CAPS   Oral   Take 1 capsule by mouth daily.   30 capsule   3   . ondansetron (ZOFRAN) 4 MG tablet   Oral   Take 1 tablet (4 mg total) by mouth every 8 (eight) hours as needed for nausea.   30 tablet   1   . Pancrelipase, Lip-Prot-Amyl, (ZENPEP) 20000 UNITS CPEP   Oral   Take 2 capsules (40,000 Units total) by mouth 3 (three) times daily with meals. 1 with snacks   240 capsule   3   . pantoprazole (PROTONIX) 40 MG  tablet   Oral   Take 1 tablet (40 mg total) by mouth 2 (two) times daily.   60 tablet   3   . polyethylene glycol-electrolytes (TRILYTE) 420 G solution   Oral   Take 4,000 mLs by mouth as directed.   4000 mL   0   . promethazine (PHENERGAN) 25 MG tablet   Oral   Take 1 tablet (25 mg total) by mouth every 6 (six) hours as needed for nausea.   30 tablet   0   . sertraline (ZOLOFT) 25 MG tablet   Oral   Take 25 mg by mouth at  bedtime.           Triage Vitals: BP 111/44  Pulse 118  Temp(Src) 98.1 F (36.7 C) (Oral)  Resp 18  Ht 5\' 2"  (1.575 m)  Wt 178 lb 1 oz (80.769 kg)  BMI 32.56 kg/m2  SpO2 98%  Vital signs normal except tachycardia   Physical Exam  Nursing note and vitals reviewed. Constitutional: She is oriented to person, place, and time. She appears well-developed and well-nourished.  Non-toxic appearance. She does not appear ill. No distress.  HENT:  Head: Normocephalic and atraumatic.  Right Ear: External ear normal.  Left Ear: External ear normal.  Nose: Nose normal. No mucosal edema or rhinorrhea.  Mouth/Throat: Oropharynx is clear and moist and mucous membranes are normal. No dental abscesses or edematous.  Eyes: Conjunctivae and EOM are normal. Pupils are equal, round, and reactive to light.  Neck: Normal range of motion and full passive range of motion without pain. Neck supple.  Cardiovascular: Normal rate, regular rhythm and normal heart sounds.  Exam reveals no gallop and no friction rub.   No murmur heard. Pulmonary/Chest: Effort normal and breath sounds normal. No respiratory distress. She has no wheezes. She has no rhonchi. She has no rales. She exhibits no tenderness and no crepitus.  Abdominal: Soft. Normal appearance and bowel sounds are normal. She exhibits no distension. There is tenderness (diffusely tender to the upper abdomen). There is no rebound and no guarding.  Genitourinary:  No external hemorrhoids, stool in the vault that was hard,  glove had specs of brown stool and red specs  Musculoskeletal: She exhibits no edema and no tenderness.  Moves all extremities well.   Neurological: She is alert and oriented to person, place, and time. She has normal strength. No cranial nerve deficit.  Skin: Skin is warm, dry and intact. No rash noted. She is not diaphoretic. No erythema. No pallor.  Psychiatric: She has a normal mood and affect. Her speech is normal and behavior is normal. Her mood appears not anxious.    ED Course  Procedures (including critical care time)  Medications  polyethylene glycol powder (GLYCOLAX/MIRALAX) container 1 Container (not administered)  sorbitol 70 % solution 30 mL (not administered)  0.9 %  sodium chloride infusion ( Intravenous New Bag/Given 12/02/12 0102)  metoCLOPramide (REGLAN) injection 10 mg (not administered)  LORazepam (ATIVAN) injection 0.5 mg (not administered)  hydrALAZINE (APRESOLINE) injection 5 mg (not administered)  sodium chloride 0.9 % bolus 1,000 mL (0 mLs Intravenous Stopped 12/01/12 2315)  metoCLOPramide (REGLAN) injection 10 mg (10 mg Intravenous Given 12/01/12 2148)  diphenhydrAMINE (BENADRYL) injection 25 mg (25 mg Intravenous Given 12/01/12 2147)  fentaNYL (SUBLIMAZE) injection 50 mcg (50 mcg Intravenous Given 12/01/12 2150)    DIAGNOSTIC STUDIES: Oxygen Saturation is 98% on room air, normal by my interpretation.    COORDINATION OF CARE: 9:17 PM-Advised pt that smoking can increase chronic issues.   9:24 PM-Discussed treatment plan which includes consult with GI, medications, CBC panel, CMP and UA with pt at bedside and pt agreed to plan.   10:06 PM-Consult complete with Dr. Darrick Penna, GI. Patient case explained and discussed. Dr. Darrick Penna feels patient should be admitted for further evaluation and treatment. Call ended at 10:07 PM.  23:28 Dr Laqueta Linden admit to med-surg, bowel prep for colonoscopy  Results for orders placed during the hospital encounter of 12/01/12  URINALYSIS,  ROUTINE W REFLEX MICROSCOPIC      Result Value Range   Color, Urine YELLOW  YELLOW  APPearance CLEAR  CLEAR   Specific Gravity, Urine <1.005 (*) 1.005 - 1.030   pH 6.0  5.0 - 8.0   Glucose, UA NEGATIVE  NEGATIVE mg/dL   Hgb urine dipstick NEGATIVE  NEGATIVE   Bilirubin Urine NEGATIVE  NEGATIVE   Ketones, ur NEGATIVE  NEGATIVE mg/dL   Protein, ur NEGATIVE  NEGATIVE mg/dL   Urobilinogen, UA 0.2  0.0 - 1.0 mg/dL   Nitrite NEGATIVE  NEGATIVE   Leukocytes, UA NEGATIVE  NEGATIVE  CBC WITH DIFFERENTIAL      Result Value Range   WBC 8.4  4.0 - 10.5 K/uL   RBC 3.55 (*) 3.87 - 5.11 MIL/uL   Hemoglobin 8.9 (*) 12.0 - 15.0 g/dL   HCT 81.1 (*) 91.4 - 78.2 %   MCV 80.0  78.0 - 100.0 fL   MCH 25.1 (*) 26.0 - 34.0 pg   MCHC 31.3  30.0 - 36.0 g/dL   RDW 95.6  21.3 - 08.6 %   Platelets 270  150 - 400 K/uL   Neutrophils Relative 61  43 - 77 %   Neutro Abs 5.1  1.7 - 7.7 K/uL   Lymphocytes Relative 25  12 - 46 %   Lymphs Abs 2.1  0.7 - 4.0 K/uL   Monocytes Relative 9  3 - 12 %   Monocytes Absolute 0.8  0.1 - 1.0 K/uL   Eosinophils Relative 5  0 - 5 %   Eosinophils Absolute 0.4  0.0 - 0.7 K/uL   Basophils Relative 0  0 - 1 %   Basophils Absolute 0.0  0.0 - 0.1 K/uL  COMPREHENSIVE METABOLIC PANEL      Result Value Range   Sodium 135  135 - 145 mEq/L   Potassium 3.7  3.5 - 5.1 mEq/L   Chloride 97  96 - 112 mEq/L   CO2 28  19 - 32 mEq/L   Glucose, Bld 175 (*) 70 - 99 mg/dL   BUN 13  6 - 23 mg/dL   Creatinine, Ser 5.78  0.50 - 1.10 mg/dL   Calcium 9.2  8.4 - 46.9 mg/dL   Total Protein 6.8  6.0 - 8.3 g/dL   Albumin 3.6  3.5 - 5.2 g/dL   AST 25  0 - 37 U/L   ALT 21  0 - 35 U/L   Alkaline Phosphatase 83  39 - 117 U/L   Total Bilirubin 0.2 (*) 0.3 - 1.2 mg/dL   GFR calc non Af Amer >90  >90 mL/min   GFR calc Af Amer >90  >90 mL/min  LIPASE, BLOOD      Result Value Range   Lipase 59  11 - 59 U/L  SAMPLE TO BLOOD BANK      Result Value Range   Blood Bank Specimen SAMPLE AVAILABLE FOR  TESTING     Sample Expiration 12/04/2012     Laboratory interpretation all normal except worsening anemia, hyperglycemia  Hemoccult was +  Dg Abd 1 View  11/22/2012  *RADIOLOGY REPORT*  Clinical Data: Question ingested foreign body on CT.  ABDOMEN - 1 VIEW  Comparison: 11/14/2012 CT.  Findings: Surgical clips right upper quadrant.  Radiopaque structures noted on prior CT have cleared and may have represented ingested material.  IMPRESSION: Radiopaque structures noted on prior CT have cleared and may have represented ingested material.   Original Report Authenticated By: Lacy Duverney, M.D.     1. Rectal bleeding   2. Anemia   3. Abdominal pain  4. constipation  Plan admission  Devoria Albe, MD, FACEP   MDM   I personally performed the services described in this documentation, which was scribed in my presence. The recorded information has been reviewed and considered.  Devoria Albe, MD, Armando Gang    Ward Givens, MD 12/02/12 [phone]

## 2012-12-01 NOTE — Telephone Encounter (Signed)
Pt is aware. Lab order faxed to lab. Benedetto Goad, please schedule pt asap

## 2012-12-01 NOTE — ED Notes (Signed)
Positive hemacult, results shown to Dr Lynelle Doctor.

## 2012-12-02 ENCOUNTER — Inpatient Hospital Stay (HOSPITAL_COMMUNITY): Payer: Medicare Other

## 2012-12-02 ENCOUNTER — Encounter (HOSPITAL_COMMUNITY): Payer: Self-pay | Admitting: *Deleted

## 2012-12-02 DIAGNOSIS — K5900 Constipation, unspecified: Secondary | ICD-10-CM | POA: Diagnosis present

## 2012-12-02 DIAGNOSIS — D649 Anemia, unspecified: Secondary | ICD-10-CM

## 2012-12-02 DIAGNOSIS — K625 Hemorrhage of anus and rectum: Secondary | ICD-10-CM

## 2012-12-02 DIAGNOSIS — D62 Acute posthemorrhagic anemia: Secondary | ICD-10-CM | POA: Diagnosis present

## 2012-12-02 DIAGNOSIS — K449 Diaphragmatic hernia without obstruction or gangrene: Secondary | ICD-10-CM | POA: Diagnosis not present

## 2012-12-02 LAB — CBC
HCT: 25.6 % — ABNORMAL LOW (ref 36.0–46.0)
Hemoglobin: 8 g/dL — ABNORMAL LOW (ref 12.0–15.0)
MCH: 24.9 pg — ABNORMAL LOW (ref 26.0–34.0)
MCHC: 31.3 g/dL (ref 30.0–36.0)
MCV: 79.8 fL (ref 78.0–100.0)
Platelets: 239 K/uL (ref 150–400)
RBC: 3.21 MIL/uL — ABNORMAL LOW (ref 3.87–5.11)
RDW: 14.5 % (ref 11.5–15.5)
WBC: 7.1 K/uL (ref 4.0–10.5)

## 2012-12-02 LAB — BASIC METABOLIC PANEL WITH GFR
BUN: 11 mg/dL (ref 6–23)
CO2: 25 meq/L (ref 19–32)
Calcium: 8.3 mg/dL — ABNORMAL LOW (ref 8.4–10.5)
Chloride: 105 meq/L (ref 96–112)
Creatinine, Ser: 0.61 mg/dL (ref 0.50–1.10)
GFR calc Af Amer: >90 mL/min
GFR calc non Af Amer: >90 mL/min
Glucose, Bld: 128 mg/dL — ABNORMAL HIGH (ref 70–99)
Potassium: 3.9 meq/L (ref 3.5–5.1)
Sodium: 139 meq/L (ref 135–145)

## 2012-12-02 LAB — HEMOGLOBIN AND HEMATOCRIT, BLOOD
HCT: 26.2 % — ABNORMAL LOW (ref 36.0–46.0)
Hemoglobin: 8.2 g/dL — ABNORMAL LOW (ref 12.0–15.0)

## 2012-12-02 MED ORDER — ACETAMINOPHEN 650 MG RE SUPP: 650.0000 mg | Freq: Four times a day (QID) | RECTAL | Status: DC | PRN

## 2012-12-02 MED ORDER — SORBITOL 70 % SOLN
30.0000 mL | Freq: Once | Status: AC
  Administered 2012-12-02: 30 mL via ORAL
  Filled 2012-12-02 (×2): qty 30

## 2012-12-02 MED ORDER — LORAZEPAM 2 MG/ML IJ SOLN
0.5000 mg | INTRAMUSCULAR | Status: DC | PRN
  Administered 2012-12-02 – 2012-12-05 (×8): 0.5 mg via INTRAVENOUS
  Filled 2012-12-02 (×8): qty 1

## 2012-12-02 MED ORDER — SENNA 8.6 MG PO TABS
1.0000 | ORAL_TABLET | Freq: Two times a day (BID) | ORAL | Status: DC
  Administered 2012-12-02 (×2): 8.6 mg via ORAL
  Filled 2012-12-02 (×2): qty 1

## 2012-12-02 MED ORDER — MORPHINE SULFATE 4 MG/ML IJ SOLN
4.0000 mg | INTRAMUSCULAR | Status: DC | PRN
  Administered 2012-12-02 – 2012-12-05 (×15): 4 mg via INTRAVENOUS
  Filled 2012-12-02 (×15): qty 1

## 2012-12-02 MED ORDER — SODIUM CHLORIDE 0.9 % IV SOLN
INTRAVENOUS | Status: DC
  Administered 2012-12-02 (×2): via INTRAVENOUS

## 2012-12-02 MED ORDER — PANTOPRAZOLE SODIUM 40 MG IV SOLR
40.0000 mg | Freq: Two times a day (BID) | INTRAVENOUS | Status: DC
Start: 2012-12-02 — End: 2012-12-05
  Administered 2012-12-02 – 2012-12-05 (×7): 40 mg via INTRAVENOUS
  Filled 2012-12-02 (×7): qty 40

## 2012-12-02 MED ORDER — SODIUM CHLORIDE 0.9 % IV SOLN
INTRAVENOUS | Status: DC
  Administered 2012-12-03 – 2012-12-05 (×4): via INTRAVENOUS

## 2012-12-02 MED ORDER — ALUM & MAG HYDROXIDE-SIMETH 200-200-20 MG/5ML PO SUSP: 30.0000 mL | Freq: Four times a day (QID) | ORAL | Status: DC | PRN

## 2012-12-02 MED ORDER — FAMOTIDINE IN NACL 20-0.9 MG/50ML-% IV SOLN
INTRAVENOUS | Status: AC
  Filled 2012-12-02: qty 50

## 2012-12-02 MED ORDER — PNEUMOCOCCAL VAC POLYVALENT 25 MCG/0.5ML IJ INJ
0.5000 mL | INJECTION | INTRAMUSCULAR | Status: AC
  Filled 2012-12-02: qty 0.5

## 2012-12-02 MED ORDER — IOHEXOL 300 MG/ML  SOLN
100.0000 mL | Freq: Once | INTRAMUSCULAR | Status: AC | PRN
  Administered 2012-12-02: 100 mL via INTRAVENOUS

## 2012-12-02 MED ORDER — ONDANSETRON HCL 4 MG/2ML IJ SOLN
4.0000 mg | Freq: Four times a day (QID) | INTRAMUSCULAR | Status: DC | PRN
  Administered 2012-12-02 – 2012-12-04 (×6): 4 mg via INTRAVENOUS
  Filled 2012-12-02 (×6): qty 2

## 2012-12-02 MED ORDER — HYDRALAZINE HCL 20 MG/ML IJ SOLN: 5.0000 mg | INTRAMUSCULAR | Status: DC | PRN

## 2012-12-02 MED ORDER — PEG 3350-KCL-NA BICARB-NACL 420 G PO SOLR
4000.0000 mL | Freq: Once | ORAL | Status: AC
  Administered 2012-12-02: 4000 mL via ORAL
  Filled 2012-12-02: qty 4000

## 2012-12-02 MED ORDER — METOCLOPRAMIDE HCL 5 MG/ML IJ SOLN
10.0000 mg | Freq: Four times a day (QID) | INTRAMUSCULAR | Status: DC | PRN
Start: 2012-12-02 — End: 2012-12-05
  Administered 2012-12-03 – 2012-12-04 (×3): 10 mg via INTRAVENOUS
  Filled 2012-12-02 (×3): qty 2

## 2012-12-02 MED ORDER — IPRATROPIUM BROMIDE 0.02 % IN SOLN
0.5000 mg | RESPIRATORY_TRACT | Status: DC | PRN
  Administered 2012-12-02: 0.5 mg via RESPIRATORY_TRACT
  Filled 2012-12-02 (×2): qty 2.5

## 2012-12-02 MED ORDER — MORPHINE SULFATE 2 MG/ML IJ SOLN
2.0000 mg | INTRAMUSCULAR | Status: DC | PRN
  Administered 2012-12-02: 2 mg via INTRAVENOUS
  Filled 2012-12-02: qty 1

## 2012-12-02 MED ORDER — ALBUTEROL SULFATE (5 MG/ML) 0.5% IN NEBU
2.5000 mg | INHALATION_SOLUTION | RESPIRATORY_TRACT | Status: DC | PRN
  Administered 2012-12-02: 2.5 mg via RESPIRATORY_TRACT
  Filled 2012-12-02 (×2): qty 0.5

## 2012-12-02 MED ORDER — HYDROMORPHONE HCL PF 1 MG/ML IJ SOLN
1.0000 mg | Freq: Once | INTRAMUSCULAR | Status: AC
  Administered 2012-12-02: 1 mg via INTRAVENOUS
  Filled 2012-12-02: qty 1

## 2012-12-02 MED ORDER — ONDANSETRON HCL 4 MG PO TABS: 4.0000 mg | ORAL_TABLET | Freq: Four times a day (QID) | ORAL | Status: DC | PRN

## 2012-12-02 MED ORDER — ZOLPIDEM TARTRATE 5 MG PO TABS
5.0000 mg | ORAL_TABLET | Freq: Every evening | ORAL | Status: DC | PRN
  Administered 2012-12-03 (×2): 5 mg via ORAL
  Filled 2012-12-02 (×2): qty 1

## 2012-12-02 MED ORDER — BISACODYL 5 MG PO TBEC
10.0000 mg | DELAYED_RELEASE_TABLET | Freq: Two times a day (BID) | ORAL | Status: AC
  Administered 2012-12-02 – 2012-12-03 (×4): 10 mg via ORAL
  Filled 2012-12-02 (×4): qty 2

## 2012-12-02 MED ORDER — POLYETHYLENE GLYCOL 3350 17 GM/SCOOP PO POWD
1.0000 | Freq: Once | ORAL | Status: DC
  Filled 2012-12-02: qty 255

## 2012-12-02 MED ORDER — IOHEXOL 300 MG/ML  SOLN
50.0000 mL | Freq: Once | INTRAMUSCULAR | Status: AC | PRN
  Administered 2012-12-02: 50 mL via ORAL

## 2012-12-02 MED ORDER — ALBUTEROL SULFATE (5 MG/ML) 0.5% IN NEBU
INHALATION_SOLUTION | RESPIRATORY_TRACT | Status: AC
  Administered 2012-12-02: 2.5 mg via RESPIRATORY_TRACT
  Filled 2012-12-02: qty 0.5

## 2012-12-02 MED ORDER — ACETAMINOPHEN 325 MG PO TABS: 650.0000 mg | ORAL_TABLET | Freq: Four times a day (QID) | ORAL | Status: DC | PRN

## 2012-12-02 MED ORDER — FAMOTIDINE IN NACL 20-0.9 MG/50ML-% IV SOLN
20.0000 mg | Freq: Two times a day (BID) | INTRAVENOUS | Status: DC
  Administered 2012-12-02 – 2012-12-05 (×7): 20 mg via INTRAVENOUS
  Filled 2012-12-02 (×9): qty 50
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
## 2012-12-02 NOTE — Progress Notes (Signed)
Patient has had total of 13 doses of Miralax over the last 48 hours. She has significant constipation on today's CT. Discussed with Dr. Darrick Penna and Dr. Jena Gauss as he is covering GI service over the weekend.   Plan for two day bowel prep. Likely have colonoscopy on Monday unless she is cleaned out and can be done Sunday.

## 2012-12-02 NOTE — Consult Note (Signed)
2 day PREP FOR TCS. REVIEWED.

## 2012-12-02 NOTE — Clinical Documentation Improvement (Signed)
Anemia Blood Loss Clarification  THIS DOCUMENT IS NOT A PERMANENT PART OF THE MEDICAL RECORD  RESPOND TO THE THIS QUERY, FOLLOW THE INSTRUCTIONS BELOW:  1. If needed, update documentation for the patient's encounter via the notes activity.  2. Access this query again and click edit on the In [employer].  3. After updating, or not, click F2 to complete all highlighted (required) fields concerning your review. Select "additional documentation in the medical record" OR "no additional documentation provided".  4. Click Sign note button.  5. The deficiency will fall out of your In Basket *Please let us know if you are not able to complete this workflow by phone or e-mail (listed below).        12/02/12  Dear Dr. Kerry Hough Marton Redwood  In an effort to better capture your patient's severity of illness, reflect appropriate length of stay and utilization of resources, a review of the patient medical record has revealed the following indicators.    Based on your clinical judgment, please clarify and document in a progress note and/or discharge summary the clinical condition associated with the following supporting information:  In responding to this query please exercise your independent judgment.  The fact that a query is asked, does not imply that any particular answer is desired or expected.  Possible Clinical Conditions?   Expected Acute Blood Loss Anemia Acute Blood Loss Anemia Acute on chronic blood loss anemia Chronic blood loss anemia Precipitous drop in Hematocrit Other Condition________________ Cannot Clinically Determine   Supporting Information: Risk Factors:  Diagnosis of Rectal/GI bleeding Chronic pancreatitis Constipation  Signs and Symptoms  BRBPR hematochezia  Diagnostics: H/H: 11/22/12 = 10/30.9 12/01/12 = 8.0/28.4 12/02/12 = 8.0/25.6 & 8.2/26.2  Treatments: Consult Gastroenterology Repeat CT scan Monitor H/H closely. Routine VS IV fluids: IV  NS@100ml /h  Reviewed: additional documentation in the medical record  Thank You,  Debora T Williams RN, MSN Clinical Documentation Specialist: Office# [phone] [facility] Information Management [place]

## 2012-12-02 NOTE — Progress Notes (Signed)
Utilization Review Complete  

## 2012-12-02 NOTE — H&P (Signed)
[facility] History and Physical  [name] AVW:[id number] DOB: 11-08-1970 DOA: 12/01/2012  Referring physician: Weber Cooks PCP: Cassell Smiles., MD  Specialists: Dr. Jena Gauss, GI  Chief Complaint: Abdominal Pain  HPI: [name] is a 42 y.o. female with medical history significant for asthma, hypertension, chronic pancreatitis of unknown etiology, bipolar disorder and migraine headaches who presented to the emergency room complaining of a 2 week history of worsening abdominal pain has been associated with severe nausea and vomiting. She reports an episode at home of vomiting up blood or where she went to [facility] for evaluation. The [facility] she was told she had a "bleeding hiatal hernia". He continued to have problems with abdominal pain after her discharge which was approximately 3 weeks ago followed up with Dr. Jena Gauss who put her on pancreatic enzymes and scheduled her for a one week later She's been unable to have any nutritional intake or hydration for the past 2 days due to severe epigastric pain that radiates to her back. Additionally, she reports having severe constipation for the past several days she has had to strain in order to have a bowel movement and has had issues in the past with bleeding with constipation. She however feels like this particular episode that was more blood on her stool and therefore she came in for evaluation. In the ED she was hemodynamically stable but was found to have a low hemoglobin at 8.4 which comparing her prior laboratories seems to be a generalized trend downward and needs evaluation with endoscopy and colonoscopy.  Review of Systems: The patient denies anorexia, fever, weight loss,, vision loss, decreased hearing, hoarseness, chest pain, syncope, dyspnea on exertion, peripheral edema, balance deficits, hemoptysis, abdominal pain, melena, hematochezia, severe indigestion/heartburn, hematuria, incontinence, genital sores,  muscle weakness, suspicious skin lesions, transient blindness, difficulty walking, depression, unusual weight change, enlarged lymph nodes, angioedema, and breast masses.    Past Medical History  Diagnosis Date  . Asthma   . Depression   . Migraine   . Pancreatitis chronic Idiopathic    Treated by Dr. Margaretha Glassing at [facility] in the past with celiac blocks.  . Bipolar 1 disorder   . Hyperlipidemia 02/03/2012  . Diabetes mellitus   . COPD (chronic obstructive pulmonary disease)   . Neck pain 06/08/2012   Past Surgical History  Procedure Laterality Date  . Cholecystectomy    . Tonsillectomy    . Abdominal hysterectomy    . Esophagogastroduodenoscopy  02/20/2008    WGN:[id number] distal esophageal mucosa, suspicious for neoplasm/Hiatal hernia otherwise normal   . Esophagogastroduodenoscopy  04/03/2008    HQI:[id number] hiatal hernia, otherwise normal/Short, tight, benign-appearing peptic stricture  . Ileocolonoscopy  06/05/2008      MWN:UUVOZDG anal canal, otherwise normal rectum, colon  . Esophagogastroduodenoscopy  November 16, 2012    Dr. Teena Dunk: hiatal hernia, esophagitis,    Social History:  reports that she has been smoking Cigarettes.  She has a 15 pack-year smoking history. She has quit using smokeless tobacco. She reports that she does not drink alcohol or use illicit drugs.   Allergies  Allergen Reactions  . Penicillins Shortness Of Breath       . Sulfa Antibiotics Shortness Of Breath  . Prednisone Other (See Comments)    Pancreatitis, but has to take for asthma sometimes    Family History  Problem Relation Age of Onset  . Asthma Other   . Coronary artery disease Neg Hx   . Colon cancer Neg Hx  Prior to Admission medications   Medication Sig Start Date End Date Taking? Authorizing Provider  amLODipine (NORVASC) 10 MG tablet Take 10 mg by mouth every morning.    Yes Historical Provider, MD  clonazePAM (KLONOPIN) 0.5 MG tablet Take 0.5 mg by mouth 3  (three) times daily as needed. For anxiety   Yes Historical Provider, MD  docusate sodium (COLACE) 100 MG capsule Take 100 mg by mouth at bedtime.    Yes Historical Provider, MD  gabapentin (NEURONTIN) 300 MG capsule Take 600 mg by mouth 3 (three) times daily.    Yes Historical Provider, MD  Linaclotide 290 MCG CAPS Take 1 capsule by mouth daily. 11/22/12  Yes Nira Retort, NP  ondansetron (ZOFRAN) 4 MG tablet Take 1 tablet (4 mg total) by mouth every 8 (eight) hours as needed for nausea. 11/22/12  Yes Nira Retort, NP  Pancrelipase, Lip-Prot-Amyl, (ZENPEP) 20000 UNITS CPEP Take 2 capsules (40,000 Units total) by mouth 3 (three) times daily with meals. 1 with snacks 11/22/12  Yes Nira Retort, NP  sertraline (ZOLOFT) 25 MG tablet Take 25 mg by mouth at bedtime.   Yes Historical Provider, MD  albuterol (PROVENTIL HFA;VENTOLIN HFA) 108 (90 BASE) MCG/ACT inhaler Inhale 2 puffs into the lungs daily as needed. For shortness of breath    Historical Provider, MD  albuterol (PROVENTIL) (2.5 MG/3ML) 0.083% nebulizer solution Take 2.5 mg by nebulization every 6 (six) hours as needed. For shortness of breath     Historical Provider, MD  pantoprazole (PROTONIX) 40 MG tablet Take 1 tablet (40 mg total) by mouth 2 (two) times daily. 11/30/12   Nira Retort, NP  polyethylene glycol-electrolytes (TRILYTE) 420 G solution Take 4,000 mLs by mouth as directed. 12/01/12   Corbin Ade, MD  promethazine (PHENERGAN) 25 MG tablet Take 1 tablet (25 mg total) by mouth every 6 (six) hours as needed for nausea. 11/30/12   Nira Retort, NP   Physical Exam: Filed Vitals:   12/02/12 0040 12/02/12 0105 12/02/12 0539 12/02/12 0551  BP: 100/57 96/63  101/67  Pulse: 97 87  95  Temp:  97.5 F (36.4 C)  97.9 F (36.6 C)  TempSrc:  Oral  Oral  Resp: 18   20  Height:  5\' 2"  (1.575 m)    Weight:  81.058 kg (178 lb 11.2 oz)    SpO2: 96% 91% 88% 91%     General:  Patient is anxious, tearful and holding her abdomen, she is slightly  pale  Eyes: Normal  ENT: Normal  Neck: Normal  Cardiovascular: Regular rate and rhythm slightly tachycardic  Respiratory: Normal  Abdomen: Patient guards her abdomen does not allow for complete examination very tender to palpation even on light touch does not appear to be any evidence of distended ascites or significant mass  Skin: No rashes or lesions  Musculoskeletal: Moves all 4 extremities  Psychiatric: Depressed affect  Neurologic: Nonfocal  Labs on Admission:  Basic Metabolic Panel:  Recent Labs Lab 12/01/12 2014 12/02/12 0508  NA 135 139  K 3.7 3.9  CL 97 105  CO2 28 25  GLUCOSE 175* 128*  BUN 13 11  CREATININE 0.74 0.61  CALCIUM 9.2 8.3*   Liver Function Tests:  Recent Labs Lab 12/01/12 2014  AST 25  ALT 21  ALKPHOS 83  BILITOT 0.2*  PROT 6.8  ALBUMIN 3.6    Recent Labs Lab 12/01/12 2014  LIPASE 59   No results found for this basename:  AMMONIA,  in the last 168 hours CBC:  Recent Labs Lab 12/01/12 2014 12/02/12 0508  WBC 8.4 7.1  NEUTROABS 5.1  --   HGB 8.9* 8.0*  HCT 28.4* 25.6*  MCV 80.0 79.8  PLT 270 239   Radiological Exams on Admission: No results found.  EKG: Independently reviewed. Normal sinus rhythm.  Assessment/Plan This patient is primarily being admitted for a complaint of bright red blood per rectal and on further questioning it appears that she has quite an extensive gastroenterological history including a chronic idiopathic pancreatitis that has been going on for several years. In terms of her current pain my best guess is that she has chronic pancreatitis for which she takes opiates daily which has now led to a significant constipation/impaction, which has caused BRBPR, no large volume melanotic or maroon stools.  1. Admit under inpatient status  2. Inpatient consultation GI 3. Monitor hemoglobin every 6 hours, if stable discontinue regular monitoring. 4. Started on IV PPI and H2 blocker for possible  gastritis 5. Obtain records from [facility] 6. Increase IV hydration and decrease the frequency and her goal of her when necessary opiates.  Oral home meds are being held until she is able to resume by mouth intake. Continue nebulizers for her asthma and shortness of breath as added on IV Ativan when necessary for agitation since she is on benzodiazepines as an outpatient.    Code Status: Full code  Family Communication: Discussed with patient at bedside. She is extremely frustrated with her current condition and lack of resources in terms of her health insurance and chronic illnesses  Disposition Plan: To be determined based on gastroenterology plans for further evaluation and management. Time spent: 70 minutes  [facility] Pager [phone]  If 7PM-7AM, please contact night-coverage [email or web address] Password [facility] 12/02/2012, 6:44 AM

## 2012-12-02 NOTE — Progress Notes (Signed)
REVIEWED.  

## 2012-12-02 NOTE — Progress Notes (Signed)
The patient admitted earlier this morning by Dr. Phillips Odor. Please see history and physical from earlier this morning for further details.  Patient seen and examined, database reviewed.  She continues to have some abdominal discomfort. She is not noted any further blood in her stools since coming in the hospital. Hemoglobin appears to be stable. CT indicates constipation. She is being followed by GI and is undergoing 2 day bowel prep for colonoscopy.

## 2012-12-03 DIAGNOSIS — K5900 Constipation, unspecified: Secondary | ICD-10-CM | POA: Diagnosis not present

## 2012-12-03 DIAGNOSIS — K625 Hemorrhage of anus and rectum: Secondary | ICD-10-CM | POA: Diagnosis not present

## 2012-12-03 DIAGNOSIS — R109 Unspecified abdominal pain: Secondary | ICD-10-CM

## 2012-12-03 DIAGNOSIS — D649 Anemia, unspecified: Secondary | ICD-10-CM | POA: Diagnosis not present

## 2012-12-03 LAB — BASIC METABOLIC PANEL
BUN: 5 mg/dL — ABNORMAL LOW (ref 6–23)
CO2: 26 mEq/L (ref 19–32)
Calcium: 8.3 mg/dL — ABNORMAL LOW (ref 8.4–10.5)
Chloride: 105 mEq/L (ref 96–112)
Creatinine, Ser: 0.71 mg/dL (ref 0.50–1.10)
Glucose, Bld: 112 mg/dL — ABNORMAL HIGH (ref 70–99)

## 2012-12-03 LAB — CBC
HCT: 25.9 % — ABNORMAL LOW (ref 36.0–46.0)
Hemoglobin: 8 g/dL — ABNORMAL LOW (ref 12.0–15.0)
MCH: 24.6 pg — ABNORMAL LOW (ref 26.0–34.0)
MCHC: 30.9 g/dL (ref 30.0–36.0)
MCV: 79.7 fL (ref 78.0–100.0)
RBC: 3.25 MIL/uL — ABNORMAL LOW (ref 3.87–5.11)
WBC: 6.1 10*3/uL (ref 4.0–10.5)

## 2012-12-03 MED ORDER — PEG 3350-KCL-NA BICARB-NACL 420 G PO SOLR
4000.0000 mL | Freq: Once | ORAL | Status: AC
  Administered 2012-12-04: 4000 mL via ORAL
  Filled 2012-12-03: qty 4000

## 2012-12-03 MED ORDER — SODIUM CHLORIDE 0.9 % IV SOLN: INTRAVENOUS | Status: DC

## 2012-12-03 NOTE — Progress Notes (Signed)
Patient states she's had one bowel movement today. No bowel movements witnessed by nursing staff. One half a gallon of Golightly sitting at the bedside. Nursing staff do not have any knowledge as to how long it has been there. No rectal bleeding. Patient states abdominal pain improved.   Vital signs in last 24 hours: Temp:  [97.9 F (36.6 C)-98.1 F (36.7 C)] 98.1 F (36.7 C) (05/10 0616) Pulse Rate:  [88-95] 95 (05/10 0616) Resp:  [20] 20 (05/10 0616) BP: (102-117)/(66-75) 117/66 mmHg (05/10 0616) SpO2:  [91 %-94 %] 93 % (05/10 0815) Last BM Date: 12/02/12 General:   Alert,  Well-developed, well-nourished, pleasant and cooperative in NAD Abdomen:  Nondistended. Positive bowel sounds. Minimal diffuse tenderness. No mass or organomegaly Extremities:  Without clubbing or edema.    Intake/Output from previous day: 05/09 0701 - 05/10 0700 In: 2189.7 [I.V.:2189.7] Out: -  Intake/Output this shift: Total I/O In: 50 [IV Piggyback:50] Out: -   Lab Results:  Recent Labs  12/01/12 2014 12/02/12 0508 12/02/12 1155 12/03/12 0625  WBC 8.4 7.1  --  6.1  HGB 8.9* 8.0* 8.2* 8.0*  HCT 28.4* 25.6* 26.2* 25.9*  PLT 270 239  --  194   BMET  Recent Labs  12/01/12 2014 12/02/12 0508 12/03/12 0625  NA 135 139 140  K 3.7 3.9 3.8  CL 97 105 105  CO2 28 25 26   GLUCOSE 175* 128* 112*  BUN 13 11 5*  CREATININE 0.74 0.61 0.71  CALCIUM 9.2 8.3* 8.3*   LFT  Recent Labs  12/01/12 2014  PROT 6.8  ALBUMIN 3.6  AST 25  ALT 21  ALKPHOS 83  BILITOT 0.2*    Impression:  Constipation/obstipation with low volume hematochezia with significant anemia. Remains stable.  Recommendations:  Clear liquid diet. 4 L GoLYTELY purge tomorrow morning-4 L over 4 hours. Importance of pace reviewed with nursing staff. She may need additional purge later in the day. Plan for diagnostic colonoscopy May 12

## 2012-12-03 NOTE — Progress Notes (Signed)
[facility] PROGRESS NOTE  [name] ZOX:[id number] DOB: 08-09-1970 DOA: 12/01/2012 PCP: Cassell Smiles., MD  Assessment/Plan: 1. Bright red blood per rectum. Hemoglobin has remained stable during her hospital stay. Gastroenterology is following and plans on colonoscopy. 2. Constipation. She is currently undergoing bowel prep and is having frequent bowel movements 3. Acute blood loss anemia. We'll transfuse her hemoglobin less than 7 4. Chronic pancreatitis. Patient is on pancreatic enzyme supplements and followed at [facility].  Code Status: full code Family Communication: discussed with patient Disposition Plan: Discharge home once improved   Consultants:  gastroenterology  Procedures:  none  Antibiotics:  none  HPI/Subjective: Patient complains of nausea today. She is having frequent stools from bowel prep.  Objective: Filed Vitals:   12/02/12 1638 12/02/12 2047 12/03/12 0616 12/03/12 0815  BP:  102/75 117/66   Pulse:  88 95   Temp:  97.9 F (36.6 C) 98.1 F (36.7 C)   TempSrc:  Oral Oral   Resp:  20 20   Height:      Weight:      SpO2: 91% 94% 92% 93%    Intake/Output Summary (Last 24 hours) at 12/03/12 1343 Last data filed at 12/03/12 1014  Gross per 24 hour  Intake 2239.67 ml  Output      0 ml  Net 2239.67 ml   Filed Weights   12/01/12 1751 12/02/12 0105  Weight: 80.769 kg (178 lb 1 oz) 81.058 kg (178 lb 11.2 oz)    Exam:   General:  No acute distress  Cardiovascular: S1 regular rate and rhythm  Respiratory: Clear to auscultation bilaterally  Abdomen: Soft, nontender, nondistended, bowel sounds are active  Musculoskeletal: No pedal edema bilaterally   Data Reviewed: Basic Metabolic Panel:  Recent Labs Lab 12/01/12 2014 12/02/12 0508 12/03/12 0625  NA 135 139 140  K 3.7 3.9 3.8  CL 97 105 105  CO2 28 25 26   GLUCOSE 175* 128* 112*  BUN 13 11 5*  CREATININE 0.74 0.61 0.71  CALCIUM 9.2 8.3* 8.3*   Liver Function  Tests:  Recent Labs Lab 12/01/12 2014  AST 25  ALT 21  ALKPHOS 83  BILITOT 0.2*  PROT 6.8  ALBUMIN 3.6    Recent Labs Lab 12/01/12 2014  LIPASE 59   No results found for this basename: AMMONIA,  in the last 168 hours CBC:  Recent Labs Lab 12/01/12 2014 12/02/12 0508 12/02/12 1155 12/03/12 0625  WBC 8.4 7.1  --  6.1  NEUTROABS 5.1  --   --   --   HGB 8.9* 8.0* 8.2* 8.0*  HCT 28.4* 25.6* 26.2* 25.9*  MCV 80.0 79.8  --  79.7  PLT 270 239  --  194   Cardiac Enzymes: No results found for this basename: CKTOTAL, CKMB, CKMBINDEX, TROPONINI,  in the last 168 hours BNP (last 3 results) No results found for this basename: PROBNP,  in the last 8760 hours CBG: No results found for this basename: GLUCAP,  in the last 168 hours  Recent Results (from the past 240 hour(s))  MRSA PCR SCREENING     Status: None   Collection Time    12/02/12  1:59 AM      Result Value Range Status   MRSA by PCR NEGATIVE  NEGATIVE Final   Comment:            The GeneXpert MRSA Assay (FDA     approved for NASAL specimens     only), is one component  of a     comprehensive MRSA colonization     surveillance program. It is not     intended to diagnose MRSA     infection nor to guide or     monitor treatment for     MRSA infections.     Studies: Ct Abdomen Pelvis W Contrast  12/02/2012  *RADIOLOGY REPORT*  Clinical Data: Pain, chronic pancreatitis  CT ABDOMEN AND PELVIS WITH CONTRAST  Technique:  Multidetector CT imaging of the abdomen and pelvis was performed following the standard protocol during bolus administration of intravenous contrast.  Contrast: 50mL OMNIPAQUE IOHEXOL 300 MG/ML  SOLN, OMNIPAQUE IOHEXOL 300 MG/ML  SOLN  Comparison:  multiple prior CT scans, most recently 11/14/2012  Findings:  Lower Chest:  Subsegmental atelectasis in the inferior lingula and right middle lobe.  Mild bilateral lower lobe bronchial wall thickening.  Visualized cardiac structures within normal limits  for size.  There is a moderately large hiatal hernia.  No pericardial effusion.  Abdomen: Unremarkable CT appearance of the intrathoracic portion of the stomach, duodenum, spleen, and adrenal glands.  Mild fatty atrophy of the pancreas.  No significant pancreatic calcifications or evidence of acute inflammation.  Focal geographic hypoattenuation of the liver in hepatic segment for the adjacent to the fissure of the falciform ligament similar to prior is most consistent with an area of fatty infiltration. The hepatic and portal veins are patent.  No focal hepatic lesion. Gallbladder is unremarkable. No intra or extrahepatic biliary ductal dilatation.  Unremarkable CT appearance of the kidneys.  No hydronephrosis or focal renal lesions.  Normal-caliber large and small bowel throughout the abdomen.  No evidence of obstruction.  Redundant sigmoid colon with a high volume of formed stool suggesting underlying constipation.  No significant diverticular disease. Normal appendix in the right lower quadrant.  No free fluid or suspicious adenopathy.  Pelvis: Unremarkable appearance of the bladder.  Surgical changes of prior hysterectomy.  No free fluid or suspicious adenopathy.  Bones: No acute fracture or aggressive appearing lytic or blastic osseous lesion. Stable T12 compression fracture with a focal exaggerated kyphosis.  Vascular: No significant atherosclerotic vascular disease or aneurysmal dilatation.  IMPRESSION:  1.  No acute intra-abdominal abnormality. 2.  Large volume of formed stool throughout the colon suggests an element of underlying constipation 3.  Surgical changes of prior hysterectomy and cholecystectomy 4.  Moderate hiatal hernia   Original Report Authenticated By: Malachy Moan, M.D.     Scheduled Meds: . bisacodyl  10 mg Oral BID  . famotidine (PEPCID) IV  20 mg Intravenous Q12H  . pantoprazole (PROTONIX) IV  40 mg Intravenous Q12H  . pneumococcal 23 valent vaccine  0.5 mL Intramuscular  Tomorrow-1000   Continuous Infusions: . sodium chloride 20 mL/hr (12/02/12 2100)  . sodium chloride    . sodium chloride      Active Problems:   BRBPR (bright red blood per rectum)   Constipation/Stool Impaction   Anemia    Time spent:    [facility]  [facility] Pager [phone]. If 7PM-7AM, please contact night-coverage at [email or web address], password [facility] 12/03/2012, 1:43 PM  LOS: 2 days

## 2012-12-04 DIAGNOSIS — R109 Unspecified abdominal pain: Secondary | ICD-10-CM | POA: Diagnosis not present

## 2012-12-04 DIAGNOSIS — K859 Acute pancreatitis without necrosis or infection, unspecified: Secondary | ICD-10-CM

## 2012-12-04 DIAGNOSIS — K625 Hemorrhage of anus and rectum: Secondary | ICD-10-CM | POA: Diagnosis not present

## 2012-12-04 DIAGNOSIS — K5900 Constipation, unspecified: Secondary | ICD-10-CM | POA: Diagnosis not present

## 2012-12-04 LAB — CBC
HCT: 27 % — ABNORMAL LOW (ref 36.0–46.0)
MCH: 24.9 pg — ABNORMAL LOW (ref 26.0–34.0)
MCV: 79.2 fL (ref 78.0–100.0)
Platelets: 212 10*3/uL (ref 150–400)
RDW: 14.6 % (ref 11.5–15.5)
WBC: 8.8 10*3/uL (ref 4.0–10.5)

## 2012-12-04 NOTE — Progress Notes (Signed)
Subjective: Working on taking colonoscopy preparation. No further blood per rectum.  Hemoglobin stable at 8.5  Objective: Vital signs in last 24 hours: Temp:  [98.1 F (36.7 C)-98.3 F (36.8 C)] 98.2 F (36.8 C) (05/11 0516) Pulse Rate:  [84-92] 92 (05/11 0516) Resp:  [18-20] 18 (05/11 0516) BP: (108-118)/(62-78) 118/62 mmHg (05/11 0516) SpO2:  [93 %-96 %] 96 % (05/11 0516) Last BM Date: 12/04/12 General:   Alert,  Well-developed, well-nourished, pleasant and cooperative in NAD Abdomen:  Soft, nontender and nondistended.  Normal bowel sounds, without guarding, and without rebound.  No mass or organomegaly. Extremities:  Without clubbing or edema.    Intake/Output from previous day: 05/10 0701 - 05/11 0700 In: 2545 [P.O.:360; I.V.:2085; IV Piggyback:100] Out: -  Intake/Output this shift: Total I/O In: 50 [IV Piggyback:50] Out: -   Lab Results:  Recent Labs  12/02/12 0508 12/02/12 1155 12/03/12 0625 12/04/12 0630  WBC 7.1  --  6.1 8.8  HGB 8.0* 8.2* 8.0* 8.5*  HCT 25.6* 26.2* 25.9* 27.0*  PLT 239  --  194 212    Impression:   Hematochezia in the setting of chronic constipation. Colonoscopy planned for tomorrow. Patient is to finish up her prep as discussed with her today.The risks, benefits, limitations, alternatives and imponderables have been reviewed with the patient. Questions have been answered. All parties are agreeable.

## 2012-12-04 NOTE — Progress Notes (Signed)
Long discussion with patient regarding importance of bowel prep before colonoscopy.  Pt discouraged d/t frequent emesis.  Pt made aware that should she not finish prep and was taken to endoscopy possibility of MD being unable to complete procedure.  Pt verbalized understanding.  Emotional support provided to patient. Husband at bedside, supportive.  Pt encouraged to ambulate in hallway.  Verbalized understanding.  Will continue to check with patient and encourage completion of bowel prep.

## 2012-12-04 NOTE — Progress Notes (Signed)
Pt ambulated in hallway approximately 200 feet, independently.  Will continue to encourage ambulation.

## 2012-12-04 NOTE — Progress Notes (Signed)
[facility] PROGRESS NOTE  GREENLY RARICK UVO:[id number] DOB: April 04, 1971 DOA: 12/01/2012 PCP: Cassell Smiles., MD  Assessment/Plan: 1. Bright red blood per rectum. Hemoglobin has remained stable during her hospital stay. She does not appear to have any further episodes of bleeding. She is undergoing bowel prep for colonoscopy on 5/12. Gastroenterology is following. 2. Constipation. She is currently undergoing bowel prep 3. Acute blood loss anemia. We'll transfuse her hemoglobin less than 7. Hemoglobin is currently stable 4. Chronic pancreatitis. Patient is on pancreatic enzyme supplements and followed at [facility].  Code Status: full code Family Communication: discussed with patient Disposition Plan: Discharge home once improved, likely 5/12   Consultants:  gastroenterology  Procedures:  none  Antibiotics:  none  HPI/Subjective: Patient reports having some nausea and vomiting some of her bowel prep today. Has mild tenderness in her abdomen.  Objective: Filed Vitals:   12/03/12 0815 12/03/12 1602 12/03/12 2051 12/04/12 0516  BP:  115/78 108/74 118/62  Pulse:  91 84 92  Temp:  98.1 F (36.7 C) 98.3 F (36.8 C) 98.2 F (36.8 C)  TempSrc:   Oral Oral  Resp:  20 18 18   Height:      Weight:      SpO2: 93% 93% 96% 96%    Intake/Output Summary (Last 24 hours) at 12/04/12 1312 Last data filed at 12/04/12 0916  Gross per 24 hour  Intake   2305 ml  Output      0 ml  Net   2305 ml   Filed Weights   12/01/12 1751 12/02/12 0105  Weight: 80.769 kg (178 lb 1 oz) 81.058 kg (178 lb 11.2 oz)    Exam:   General:  No acute distress  Cardiovascular: S1 S2 regular rate and rhythm  Respiratory: Clear to auscultation bilaterally  Abdomen: Soft, mild tenderness diffusely, nondistended, bowel sounds are active  Musculoskeletal: No pedal edema bilaterally   Data Reviewed: Basic Metabolic Panel:  Recent Labs Lab 12/01/12 2014 12/02/12 0508 12/03/12 0625  NA  135 139 140  K 3.7 3.9 3.8  CL 97 105 105  CO2 28 25 26   GLUCOSE 175* 128* 112*  BUN 13 11 5*  CREATININE 0.74 0.61 0.71  CALCIUM 9.2 8.3* 8.3*   Liver Function Tests:  Recent Labs Lab 12/01/12 2014  AST 25  ALT 21  ALKPHOS 83  BILITOT 0.2*  PROT 6.8  ALBUMIN 3.6    Recent Labs Lab 12/01/12 2014  LIPASE 59   No results found for this basename: AMMONIA,  in the last 168 hours CBC:  Recent Labs Lab 12/01/12 2014 12/02/12 0508 12/02/12 1155 12/03/12 0625 12/04/12 0630  WBC 8.4 7.1  --  6.1 8.8  NEUTROABS 5.1  --   --   --   --   HGB 8.9* 8.0* 8.2* 8.0* 8.5*  HCT 28.4* 25.6* 26.2* 25.9* 27.0*  MCV 80.0 79.8  --  79.7 79.2  PLT 270 239  --  194 212   Cardiac Enzymes: No results found for this basename: CKTOTAL, CKMB, CKMBINDEX, TROPONINI,  in the last 168 hours BNP (last 3 results) No results found for this basename: PROBNP,  in the last 8760 hours CBG: No results found for this basename: GLUCAP,  in the last 168 hours  Recent Results (from the past 240 hour(s))  MRSA PCR SCREENING     Status: None   Collection Time    12/02/12  1:59 AM      Result Value Range Status  MRSA by PCR NEGATIVE  NEGATIVE Final   Comment:            The GeneXpert MRSA Assay (FDA     approved for NASAL specimens     only), is one component of a     comprehensive MRSA colonization     surveillance program. It is not     intended to diagnose MRSA     infection nor to guide or     monitor treatment for     MRSA infections.     Studies: No results found.  Scheduled Meds: . famotidine (PEPCID) IV  20 mg Intravenous Q12H  . pantoprazole (PROTONIX) IV  40 mg Intravenous Q12H   Continuous Infusions: . sodium chloride 20 mL/hr (12/02/12 2100)  . sodium chloride 100 mL/hr at 12/04/12 0518  . sodium chloride      Active Problems:   BRBPR (bright red blood per rectum)   Constipation/Stool Impaction   Anemia    Time spent:    [facility]  [facility] Pager [phone]. If 7PM-7AM, please contact night-coverage at [email or web address], password [facility] 12/04/2012, 1:12 PM  LOS: 3 days

## 2012-12-05 ENCOUNTER — Encounter (HOSPITAL_COMMUNITY)
Admission: EM | Disposition: A | Discharge status: Home / Self Care | Payer: Self-pay | Source: Home / Self Care | Attending: Internal Medicine

## 2012-12-05 ENCOUNTER — Encounter (HOSPITAL_COMMUNITY): Payer: Self-pay | Admitting: *Deleted

## 2012-12-05 DIAGNOSIS — K859 Acute pancreatitis without necrosis or infection, unspecified: Secondary | ICD-10-CM | POA: Diagnosis not present

## 2012-12-05 DIAGNOSIS — D62 Acute posthemorrhagic anemia: Secondary | ICD-10-CM | POA: Diagnosis not present

## 2012-12-05 DIAGNOSIS — K921 Melena: Secondary | ICD-10-CM | POA: Diagnosis not present

## 2012-12-05 DIAGNOSIS — K625 Hemorrhage of anus and rectum: Secondary | ICD-10-CM | POA: Diagnosis not present

## 2012-12-05 HISTORY — PX: GIVENS CAPSULE STUDY: SHX5432

## 2012-12-05 HISTORY — PX: COLONOSCOPY: SHX5424

## 2012-12-05 LAB — CBC
MCHC: 30.8 g/dL (ref 30.0–36.0)
MCV: 78.4 fL (ref 78.0–100.0)
Platelets: 189 10*3/uL (ref 150–400)
RDW: 14.5 % (ref 11.5–15.5)
WBC: 6.4 10*3/uL (ref 4.0–10.5)

## 2012-12-05 LAB — OCCULT BLOOD, POC DEVICE: Fecal Occult Bld: POSITIVE — AB

## 2012-12-05 LAB — VITAMIN B12: Vitamin B-12: 825 pg/mL (ref 211–911)

## 2012-12-05 LAB — RETICULOCYTES
RBC.: 3.28 MIL/uL — ABNORMAL LOW (ref 3.87–5.11)
Retic Count, Absolute: 49.2 10*3/uL (ref 19.0–186.0)

## 2012-12-05 LAB — IRON AND TIBC
TIBC: 374 ug/dL (ref 250–470)
UIBC: 356 ug/dL (ref 125–400)

## 2012-12-05 LAB — FERRITIN: Ferritin: 4 ng/mL — ABNORMAL LOW (ref 10–291)

## 2012-12-05 LAB — BASIC METABOLIC PANEL
Calcium: 8 mg/dL — ABNORMAL LOW (ref 8.4–10.5)
Chloride: 105 mEq/L (ref 96–112)
Creatinine, Ser: 0.61 mg/dL (ref 0.50–1.10)
GFR calc Af Amer: >90 mL/min (ref >90)
Sodium: 139 mEq/L (ref 135–145)

## 2012-12-05 LAB — FOLATE: Folate: 17.7 ng/mL

## 2012-12-05 LAB — TYPE AND SCREEN
ABO/RH(D): A POS
Antibody Screen: NEGATIVE

## 2012-12-05 LAB — HEMOGLOBIN AND HEMATOCRIT, BLOOD: HCT: 26 % — ABNORMAL LOW (ref 36.0–46.0)

## 2012-12-05 SURGERY — COLONOSCOPY
Anesthesia: Moderate Sedation | Laterality: Left

## 2012-12-05 SURGERY — IMAGING PROCEDURE, GI TRACT, INTRALUMINAL, VIA CAPSULE

## 2012-12-05 SURGERY — COLONOSCOPY
Anesthesia: Moderate Sedation

## 2012-12-05 MED ORDER — SODIUM CHLORIDE 0.9 % IJ SOLN
INTRAMUSCULAR | Status: AC
  Filled 2012-12-05: qty 10

## 2012-12-05 MED ORDER — STERILE WATER FOR IRRIGATION IR SOLN
Status: DC | PRN
  Administered 2012-12-05: 09:00:00

## 2012-12-05 MED ORDER — MEPERIDINE HCL 100 MG/ML IJ SOLN
INTRAMUSCULAR | Status: DC | PRN
  Administered 2012-12-05 (×2): 50 mg via INTRAVENOUS

## 2012-12-05 MED ORDER — SODIUM CHLORIDE 0.9 % IV SOLN: INTRAVENOUS | Status: DC

## 2012-12-05 MED ORDER — ONDANSETRON HCL 4 MG/2ML IJ SOLN
INTRAMUSCULAR | Status: DC | PRN
  Administered 2012-12-05: 4 mg via INTRAVENOUS

## 2012-12-05 MED ORDER — MEPERIDINE HCL 100 MG/ML IJ SOLN
INTRAMUSCULAR | Status: AC
  Filled 2012-12-05: qty 1

## 2012-12-05 MED ORDER — PROMETHAZINE HCL 25 MG/ML IJ SOLN
25.0000 mg | Freq: Once | INTRAMUSCULAR | Status: AC
  Administered 2012-12-05: 25 mg via INTRAVENOUS

## 2012-12-05 MED ORDER — SODIUM CHLORIDE 0.9 % IV SOLN
250.0000 mg | Freq: Once | INTRAVENOUS | Status: AC
  Administered 2012-12-05: 250 mg via INTRAVENOUS
  Filled 2012-12-05: qty 250

## 2012-12-05 MED ORDER — MIDAZOLAM HCL 5 MG/5ML IJ SOLN
INTRAMUSCULAR | Status: DC | PRN
  Administered 2012-12-05 (×2): 2 mg via INTRAVENOUS
  Administered 2012-12-05: 1 mg via INTRAVENOUS

## 2012-12-05 MED ORDER — ONDANSETRON HCL 4 MG/2ML IJ SOLN
INTRAMUSCULAR | Status: AC
  Filled 2012-12-05: qty 2

## 2012-12-05 MED ORDER — MIDAZOLAM HCL 5 MG/5ML IJ SOLN
INTRAMUSCULAR | Status: AC
  Filled 2012-12-05: qty 10

## 2012-12-05 MED ORDER — POTASSIUM CHLORIDE 10 MEQ/100ML IV SOLN
10.0000 meq | INTRAVENOUS | Status: AC
  Administered 2012-12-05 (×3): 10 meq via INTRAVENOUS
  Filled 2012-12-05 (×3): qty 100

## 2012-12-05 MED ORDER — PROMETHAZINE HCL 25 MG/ML IJ SOLN
INTRAMUSCULAR | Status: AC
  Filled 2012-12-05: qty 1

## 2012-12-05 MED ORDER — POTASSIUM CHLORIDE CRYS ER 20 MEQ PO TBCR: 40.0000 meq | EXTENDED_RELEASE_TABLET | ORAL | Status: DC | PRN

## 2012-12-05 NOTE — Op Note (Addendum)
[facility] [address] [place], [address]   COLONOSCOPY PROCEDURE REPORT  PATIENT: [name], [name]  MR#:         [id number] BIRTHDATE: April 21, 1971 , 41  yrs. old GENDER: Female ENDOSCOPIST: R.  Roetta Sessions, MD FACP FACG REFERRED BY:  Artis Delay, M.D. PROCEDURE DATE:  12/05/2012 PROCEDURE:     Ileocolonoscopy-diagnostic  INDICATIONS: hematochezia; hemoglobin 7.7; negative EGD last month (elsewhere).  INFORMED CONSENT:  The risks, benefits, alternatives and imponderables including but not limited to bleeding, perforation as well as the possibility of a missed lesion have been reviewed.  The potential for biopsy, lesion removal, etc. have also been discussed.  Questions have been answered.  All parties agreeable. Please see the history and physical in the medical record for more information.  MEDICATIONS: Versed 5 mg IV and Demerol 100 mg IV in divided doses. Zofran 4 mg IV. Phenergan 25 mg IV.  DESCRIPTION OF PROCEDURE:  After a digital rectal exam was performed, the EC-3890Li ([id number]) and EC-3490TLi ([id number]) colonoscope was advanced from the anus through the rectum and colon to the area of the cecum, ileocecal valve and appendiceal orifice. The cecum was deeply intubated.  These structures were well-seen and photographed for the record.  From the level of the cecum and ileocecal valve, the scope was slowly and cautiously withdrawn. The mucosal surfaces were carefully surveyed utilizing scope tip deflection to facilitate fold flattening as needed.  The scope was pulled down into the rectum where a thorough examination including retroflexion was performed.    FINDINGS:  Adequate preparation. Normal rectum. Normal. Colonic mucosa. Distal 5 cm of terminal ileal mucosa also appeared normal.  Please note, there was no blood in the lower GI tract.  THERAPEUTIC / DIAGNOSTIC MANEUVERS PERFORMED:  none  COMPLICATIONS: none  CECAL WITHDRAWAL TIME:  7  minutes  IMPRESSION:  Normal rectum, colon and terminal ileum  RECOMMENDATIONS: We'll proceed with a capsule study of her small intestine later today. She remains very stable. She is to see Dr. Theophilus Kinds over at [facility] tomorrow to follow up on her chronic pancreatitis. I feel she should strive to make that appointment either by going out from this facility on pass or as an outpatient if discharged later today or in am. I have paged the hospitalist to discuss.   _______________________________ eSigned:  R. Roetta Sessions, MD FACP [facility] 12/05/2012 9:21 AM   CC:

## 2012-12-05 NOTE — OR Nursing (Signed)
Pt's necklace put back around pt's neck.  Glasses returned.  Witnessed by Jannett Celestine, RN

## 2012-12-05 NOTE — Progress Notes (Signed)
Pt has appt at [facility]. Per discussion with Dr. Jena Gauss, patient may be best served by discharge today so that she is able to be seen as an outpatient at [facility]. This will help with continuity of care, as if she misses this appt, it will only postpone further management of chronic pancreatitis. Givens capsule study has been started, with completion due for around 2010 this evening.   Spoke with Dr. Thedore Mins. If Hgb remains stable, ok from our standpoint to discharge home. Patient will need to return the givens monitor to endo tomorrow morning. However, if feasible, may be easiest to wait until around 8pm this evening when capsule is complete, remove monitor, and discharge home. If she remains inpatient, would recommend discharge as soon as possible in the morning vs out on a pass. Appt is with Dr. Lanell Matar on May 13th at 12:30    I called to speak to patient at [phone] (no phone in room, had to use cell). She is still recovering from recent sedation for colonoscopy. I have asked the nurse to page me when available so I may review all of this with her.

## 2012-12-05 NOTE — Clinical Documentation Improvement (Signed)
Abnormal Labs Clarification  THIS DOCUMENT IS NOT A PERMANENT PART OF THE MEDICAL RECORD  TO RESPOND TO THE THIS QUERY, FOLLOW THE INSTRUCTIONS BELOW:  1. If needed, update documentation for the patient's encounter via the notes activity.  2. Access this query again and click edit on the [employer].  3. After updating, or not, click F2 to complete all highlighted (required) fields concerning your review. Select "additional documentation in the medical record" OR "no additional documentation provided".  4. Click Sign note button.  5. The deficiency will fall out of your InBasket *Please let us know if you are not able to complete this workflow by phone or e-mail (listed below).  Please update your documentation within the medical record to reflect your response to this query.                                                                                   12/05/12  Dear Dr. Suanne Marker  In a better effort to capture your patient's severity of illness, reflect appropriate length of stay and utilization of resources, a review of the medical record has revealed the following indicators.    Based on your clinical judgment, please clarify and document in a progress note and/or discharge summary the clinical condition associated with the following supporting information:  In responding to this query please exercise your independent judgment.  The fact that a query is asked, does not imply that any particular answer is desired or expected.  Abnormal findings (laboratory, x-ray, pathologic, and other diagnostic results) are not coded and reported unless the physician indicates their clinical significance.   The medical record reflects the following clinical findings, please clarify the diagnostic and/or clinical significance:      Possible Clinical Conditions?                                 Hypokalemia                        Other Condition___________________   Cannot Clinically  Determine_________                                                  Supporting Information:  5/12 progress note:  Low potassium replace and recheck.  Lab Test: Potassium Normal Values : 3.5-5.1 Patient Values:  5/12 = 3.1        Treatment: potassium chloride 10 mEq Intravenous Q1 Hr x 4                    Reviewed: additional documentation in the medical record   Thank You,  Debora T Williams  RN, MSN Clinical Documentation Specialist: Office# [phone]  [facility] Information Management [place]

## 2012-12-05 NOTE — Progress Notes (Signed)
Pt and mother given discharge instructions. RN answered questions and provided a copy of instructions. Pt discharged home with mother.  [place], Arva Chafe

## 2012-12-05 NOTE — Discharge Summary (Signed)
[facility]                                                                                   [name], is a 42 y.o. female  DOB [birth year]  MRN [id number].  Admission date:  12/01/2012  Discharge Date:  12/05/2012  Primary MD  Cassell Smiles., MD  Admitting Physician  Edsel Petrin, DO  Admission Diagnosis  Rectal bleeding [569.3] Anemia [285.9] Abdominal pain [789.00]  Discharge Diagnosis     Active Problems:   BRBPR (bright red blood per rectum)   Constipation/Stool Impaction   Acute blood loss anemia    Past Medical History  Diagnosis Date  . Asthma   . Depression   . Migraine   . Pancreatitis chronic Idiopathic    Treated by Dr. Margaretha Glassing at [facility] in the past with celiac blocks.  . Bipolar 1 disorder   . Hyperlipidemia 02/03/2012  . Diabetes mellitus   . COPD (chronic obstructive pulmonary disease)   . Neck pain 06/08/2012    Past Surgical History  Procedure Laterality Date  . Cholecystectomy    . Tonsillectomy    . Abdominal hysterectomy    . Esophagogastroduodenoscopy  02/20/2008    BJY:NWGNFAOZH distal esophageal mucosa, suspicious for neoplasm/Hiatal hernia otherwise normal   . Esophagogastroduodenoscopy  04/03/2008    YQM:VHQIONGE-XBMWU hiatal hernia, otherwise normal/Short, tight, benign-appearing peptic stricture  . Ileocolonoscopy  06/05/2008      XLK:GMWNUUV anal canal, otherwise normal rectum, colon  . Esophagogastroduodenoscopy  November 16, 2012    Dr. Teena Dunk: hiatal hernia, esophagitis,      Recommendations for primary care physician for things to follow:   Follow CBC BMP, anemia panel results which are pending. She must follow with GI physician Dr. Jena Gauss to followup on her capsule endoscopy results.   Discharge Diagnoses:   Active Problems:   BRBPR (bright red blood per rectum)   Constipation/Stool Impaction   Acute blood loss anemia    Discharge Condition: Stable   Diet recommendation: See  Discharge Instructions below   Consults GI    History of present illness and  Hospital Course:     Kindly see H&P for history of present illness and admission details, please review complete Labs, Consult reports and Test reports for all details in brief [name], is a 42 y.o. female, patient with history of chronic pancreatitis follows at [facility] for this issue, chronic pain and narcotic use, chronic constipation, dyslipidemia, diabetes mellitus type 2, COPD who was admitted to the hospital for bright red blood per rectum causing acute blood loss anemia on top of anemia of chronic disease, she never required any blood transfusions remained stable hemodynamically, she was seen by GI physicians and underwent unremarkable colonoscopy. Of note patient takes narcotics for chronic pain and is chronically constipated and she could have had bright red blood due to chronic constipation from lower GI/rectal injury however colonoscopy did not reveal the same, I have ordered an anemia panel which is pending and needs to be followed up by primary care physician on next visit. I discussed her case with GI in detail today she is currently undergoing small bowel  capsule endoscopy, GI physician requests that patient can be discharged home with followup with our GI physician in 2-3 days for endoscopy results. Of note her anemia panel has been ordered it is pending and needs to be followed. She did slightly low potassium which has been adequately replaced IV and orally. She will be discharged home with close outpatient followup with PCP, GI physician here in regional and at [facility].       Today   Subjective:   [name] today has no headache,no chest abdominal pain,no new weakness tingling or numbness, feels much better wants to go home today.    Objective:   Blood pressure 126/86, pulse 83, temperature 98.6 F (37 C), temperature source Oral, resp. rate 18, height 5\' 2"  (1.575 m),  weight 80.74 kg (178 lb), SpO2 95.00%.   Intake/Output Summary (Last 24 hours) at 12/05/12 1402 Last data filed at 12/05/12 0949  Gross per 24 hour  Intake   2370 ml  Output      0 ml  Net   2370 ml    Exam Awake Alert, Oriented *3, No new F.N deficits, Normal affect [place].AT,PERRAL Supple Neck,No JVD, No cervical lymphadenopathy appriciated.  Symmetrical Chest wall movement, Good air movement bilaterally, CTAB RRR,No Gallops,Rubs or new Murmurs, No Parasternal Heave +ve B.Sounds, Abd Soft, Non tender, No organomegaly appriciated, No rebound -guarding or rigidity. No Cyanosis, Clubbing or edema, No new Rash or bruise  Data Review   Major procedures and Radiology Reports - PLEASE review detailed and final reports for all details in brief -    Colonoscopy unremarkable  Capsule endoscopy for which she will follow with GI in 3-4 days, cleared for discharge by GI here.   Dg Abd 1 View  11/22/2012  *RADIOLOGY REPORT*  Clinical Data: Question ingested foreign body on CT.  ABDOMEN - 1 VIEW  Comparison: 11/14/2012 CT.  Findings: Surgical clips right upper quadrant.  Radiopaque structures noted on prior CT have cleared and may have represented ingested material.  IMPRESSION: Radiopaque structures noted on prior CT have cleared and may have represented ingested material.   Original Report Authenticated By: Lacy Duverney, M.D.    Ct Abdomen Pelvis W Contrast  12/02/2012  *RADIOLOGY REPORT*  Clinical Data: Pain, chronic pancreatitis  CT ABDOMEN AND PELVIS WITH CONTRAST  Technique:  Multidetector CT imaging of the abdomen and pelvis was performed following the standard protocol during bolus administration of intravenous contrast.  Contrast: 50mL OMNIPAQUE IOHEXOL 300 MG/ML  SOLN, OMNIPAQUE IOHEXOL 300 MG/ML  SOLN  Comparison:  multiple prior CT scans, most recently 11/14/2012  Findings:  Lower Chest:  Subsegmental atelectasis in the inferior lingula and right middle lobe.  Mild bilateral lower  lobe bronchial wall thickening.  Visualized cardiac structures within normal limits for size.  There is a moderately large hiatal hernia.  No pericardial effusion.  Abdomen: Unremarkable CT appearance of the intrathoracic portion of the stomach, duodenum, spleen, and adrenal glands.  Mild fatty atrophy of the pancreas.  No significant pancreatic calcifications or evidence of acute inflammation.  Focal geographic hypoattenuation of the liver in hepatic segment for the adjacent to the fissure of the falciform ligament similar to prior is most consistent with an area of fatty infiltration. The hepatic and portal veins are patent.  No focal hepatic lesion. Gallbladder is unremarkable. No intra or extrahepatic biliary ductal dilatation.  Unremarkable CT appearance of the kidneys.  No hydronephrosis or focal renal lesions.  Normal-caliber large and small bowel throughout the  abdomen.  No evidence of obstruction.  Redundant sigmoid colon with a high volume of formed stool suggesting underlying constipation.  No significant diverticular disease. Normal appendix in the right lower quadrant.  No free fluid or suspicious adenopathy.  Pelvis: Unremarkable appearance of the bladder.  Surgical changes of prior hysterectomy.  No free fluid or suspicious adenopathy.  Bones: No acute fracture or aggressive appearing lytic or blastic osseous lesion. Stable T12 compression fracture with a focal exaggerated kyphosis.  Vascular: No significant atherosclerotic vascular disease or aneurysmal dilatation.  IMPRESSION:  1.  No acute intra-abdominal abnormality. 2.  Large volume of formed stool throughout the colon suggests an element of underlying constipation 3.  Surgical changes of prior hysterectomy and cholecystectomy 4.  Moderate hiatal hernia   Original Report Authenticated By: Malachy Moan, M.D.     Micro Results      Recent Results (from the past 240 hour(s))  MRSA PCR SCREENING     Status: None   Collection Time     12/02/12  1:59 AM      Result Value Range Status   MRSA by PCR NEGATIVE  NEGATIVE Final   Comment:            The GeneXpert MRSA Assay (FDA     approved for NASAL specimens     only), is one component of a     comprehensive MRSA colonization     surveillance program. It is not     intended to diagnose MRSA     infection nor to guide or     monitor treatment for     MRSA infections.     CBC w Diff: Lab Results  Component Value Date   WBC 6.4 12/05/2012   HGB 8.1* 12/05/2012   HCT 26.0* 12/05/2012   PLT 189 12/05/2012   LYMPHOPCT 25 12/01/2012   BANDSPCT 0 07/16/2008   MONOPCT 9 12/01/2012   EOSPCT 5 12/01/2012   BASOPCT 0 12/01/2012    CMP: Lab Results  Component Value Date   NA 139 12/05/2012   K 3.1* 12/05/2012   CL 105 12/05/2012   CO2 24 12/05/2012   BUN <3* 12/05/2012   CREATININE 0.61 12/05/2012   CREATININE 0.60 11/22/2012   PROT 6.8 12/01/2012   ALBUMIN 3.6 12/01/2012   BILITOT 0.2* 12/01/2012   ALKPHOS 83 12/01/2012   AST 25 12/01/2012   ALT 21 12/01/2012  .   Discharge Instructions     Follow with Primary MD Cassell Smiles., MD in 3 days , followed with a GI doctor at [facility] in 3-4 days along with GI doctor recommended by me in 3-4 days to get the results of your capsular endoscopy and anemia panel.   Get CBC, CMP, checked 3 days by Primary MD and again as instructed by your Primary MD.  Also follow on your anemia panel study results with the GI doctor and your family doctor.  Get Medicines reviewed and adjusted.  Please request your Prim.MD to go over all Hospital Tests and Procedure/Radiological results at the follow up, please get all Hospital records sent to your Prim MD by signing hospital release before you go home.  Activity: As tolerated with Full fall precautions use walker/cane & assistance as needed   Diet:  Heart Healhty  For Heart failure patients - Check your Weight same time everyday, if you gain over 2 pounds, or you develop in leg swelling, experience more  shortness of breath or chest pain, call your Primary MD  immediately. Follow Cardiac Low Salt Diet and 1.8 lit/day fluid restriction.  Disposition Home   If you experience worsening of your admission symptoms, develop shortness of breath, life threatening emergency, suicidal or homicidal thoughts you must seek medical attention immediately by calling 911 or calling your MD immediately  if symptoms less severe.  You Must read complete instructions/literature along with all the possible adverse reactions/side effects for all the Medicines you take and that have been prescribed to you. Take any new Medicines after you have completely understood and accpet all the possible adverse reactions/side effects.   Do not drive and provide baby sitting services if your were admitted for syncope or siezures until you have seen by Primary MD or a Neurologist and advised to do so again.  Do not drive when taking Pain medications.    Do not take more than prescribed Pain, Sleep and Anxiety Medications  Special Instructions: If you have smoked or chewed Tobacco  in the last 2 yrs please stop smoking, stop any regular Alcohol  and or any Recreational drug use.  Wear Seat belts while driving.    Follow-up Information   Follow up with Cassell Smiles., MD. Schedule an appointment as soon as possible for a visit in 3 days.   Contact information:   [address] [place] [address] [phone]       Follow up with Eula Listen, MD. Schedule an appointment as soon as possible for a visit in 3 days.   Contact information:   [address] [place] [address] [phone]       Follow up with Follow with Marilynne Drivers GI MD for pancreatitis issues.        Discharge Medications     Medication List    TAKE these medications       albuterol 108 (90 BASE) MCG/ACT inhaler  Commonly known as:  PROVENTIL HFA;VENTOLIN HFA  Inhale 2 puffs into the lungs  daily as needed. For shortness of breath     albuterol (2.5 MG/3ML) 0.083% nebulizer solution  Commonly known as:  PROVENTIL  Take 2.5 mg by nebulization every 6 (six) hours as needed. For shortness of breath     amLODipine 10 MG tablet  Commonly known as:  NORVASC  Take 10 mg by mouth every morning.     clonazePAM 0.5 MG tablet  Commonly known as:  KLONOPIN  Take 0.5 mg by mouth 3 (three) times daily as needed. For anxiety     docusate sodium 100 MG capsule  Commonly known as:  COLACE  Take 100 mg by mouth at bedtime.     gabapentin 300 MG capsule  Commonly known as:  NEURONTIN  Take 600 mg by mouth 3 (three) times daily.     Linaclotide 290 MCG Caps  Take 1 capsule by mouth daily.     ondansetron 4 MG tablet  Commonly known as:  ZOFRAN  Take 1 tablet (4 mg total) by mouth every 8 (eight) hours as needed for nausea.     Pancrelipase (Lip-Prot-Amyl) 20000 UNITS Cpep  Commonly known as:  ZENPEP  Take 2 capsules (40,000 Units total) by mouth 3 (three) times daily with meals. 1 with snacks     pantoprazole 40 MG tablet  Commonly known as:  PROTONIX  Take 1 tablet (40 mg total) by mouth 2 (two) times daily.     polyethylene glycol-electrolytes 420 G solution  Commonly known as:  TRILYTE  Take 4,000 mLs by mouth as directed.     promethazine 25 MG tablet  Commonly known as:  PHENERGAN  Take 1 tablet (25 mg total) by mouth every 6 (six) hours as needed for nausea.     sertraline 25 MG tablet  Commonly known as:  ZOLOFT  Take 25 mg by mouth at bedtime.           Total Time in preparing paper work, data evaluation and todays exam - 35 minutes  Leroy Sea M.D on 12/05/2012 at 2:02 PM  [facility] Office  [phone]

## 2012-12-05 NOTE — Progress Notes (Addendum)
[facility] PROGRESS NOTE  [name] WUJ:[id number] DOB: 1971/06/29 DOA: 12/01/2012 PCP: Cassell Smiles., MD   Assessment/Plan:  1. Bright red blood per rectum. Hemoglobin has remained stable during her hospital stay. She does not appear to have any further episodes of bleeding. He was unremarkable, GI is following and have scheduled a capsule endoscopy for today, we'll continue to monitor H&H closely.   2. Constipation. She is currently undergoing bowel prep   3. Acute blood loss anemia. We'll transfuse her hemoglobin less than 7.5. Monitor H&H, type screen and hold.   4. Chronic pancreatitis. Patient is on pancreatic enzyme supplements and followed at [facility].   5. Low potassium (Also known as hypokalemia.)  replace and recheck.     Code Status: full code Family Communication: discussed with patient and her husband Disposition Plan: Home discharge   Consultants:  gastroenterology  Procedures:  Colonoscopy 12/05/2012 unremarkable, capsule endoscopy scheduled for 12/05/2012.  Antibiotics:  none  HPI/Subjective: Patient reports having some nausea and vomiting some of her bowel prep today. Has mild tenderness in her abdomen.  Objective: Filed Vitals:   12/05/12 0850 12/05/12 0855 12/05/12 0900 12/05/12 0905  BP: 133/70 136/76 119/77 116/71  Pulse: 91 98 89 89  Temp:      TempSrc:      Resp: 11 20 16 13   Height:      Weight:      SpO2: 95% 97% 98% 98%    Intake/Output Summary (Last 24 hours) at 12/05/12 1042 Last data filed at 12/05/12 0949  Gross per 24 hour  Intake   2370 ml  Output      0 ml  Net   2370 ml   Filed Weights   12/01/12 1751 12/02/12 0105 12/05/12 0803  Weight: 80.769 kg (178 lb 1 oz) 81.058 kg (178 lb 11.2 oz) 80.74 kg (178 lb)    Exam:   General:  No acute distress  Cardiovascular: S1 S2 regular rate and rhythm  Respiratory: Clear to auscultation bilaterally  Abdomen: Soft, mild tenderness diffusely,  nondistended, bowel sounds are active  Musculoskeletal: No pedal edema bilaterally   Data Reviewed: Basic Metabolic Panel:  Recent Labs Lab 12/01/12 2014 12/02/12 0508 12/03/12 0625 12/05/12 0537  NA 135 139 140 139  K 3.7 3.9 3.8 3.1*  CL 97 105 105 105  CO2 28 25 26 24   GLUCOSE 175* 128* 112* 97  BUN 13 11 5* <3*  CREATININE 0.74 0.61 0.71 0.61  CALCIUM 9.2 8.3* 8.3* 8.0*   Liver Function Tests:  Recent Labs Lab 12/01/12 2014  AST 25  ALT 21  ALKPHOS 83  BILITOT 0.2*  PROT 6.8  ALBUMIN 3.6    Recent Labs Lab 12/01/12 2014  LIPASE 59   No results found for this basename: AMMONIA,  in the last 168 hours CBC:  Recent Labs Lab 12/01/12 2014 12/02/12 0508 12/02/12 1155 12/03/12 0625 12/04/12 0630 12/05/12 0537  WBC 8.4 7.1  --  6.1 8.8 6.4  NEUTROABS 5.1  --   --   --   --   --   HGB 8.9* 8.0* 8.2* 8.0* 8.5* 7.7*  HCT 28.4* 25.6* 26.2* 25.9* 27.0* 25.0*  MCV 80.0 79.8  --  79.7 79.2 78.4  PLT 270 239  --  194 212 189   Cardiac Enzymes: No results found for this basename: CKTOTAL, CKMB, CKMBINDEX, TROPONINI,  in the last 168 hours BNP (last 3 results) No results found for this basename: PROBNP,  in the  last 8760 hours CBG:  Recent Labs Lab 12/05/12 0808  GLUCAP 123*    Recent Results (from the past 240 hour(s))  MRSA PCR SCREENING     Status: None   Collection Time    12/02/12  1:59 AM      Result Value Range Status   MRSA by PCR NEGATIVE  NEGATIVE Final   Comment:            The GeneXpert MRSA Assay (FDA     approved for NASAL specimens     only), is one component of a     comprehensive MRSA colonization     surveillance program. It is not     intended to diagnose MRSA     infection nor to guide or     monitor treatment for     MRSA infections.     Studies: No results found.  Scheduled Meds: . erythromycin  250 mg Intravenous Once  . famotidine (PEPCID) IV  20 mg Intravenous Q12H  . meperidine      . midazolam      .  ondansetron      . pantoprazole (PROTONIX) IV  40 mg Intravenous Q12H  . potassium chloride  10 mEq Intravenous Q1 Hr x 4  . promethazine      . sodium chloride       Continuous Infusions: . sodium chloride 75 mL/hr at 12/05/12 1610    Active Problems:   BRBPR (bright red blood per rectum)   Constipation/Stool Impaction   Acute blood loss anemia    Time spent:    [facility] K  [facility] Pager [phone].  If 7PM-7AM, please contact night-coverage at [email or web address], password [facility] 12/05/2012, 10:42 AM  LOS: 4 days

## 2012-12-05 NOTE — Progress Notes (Signed)
Spoke with nurse, who was discharging patient. Informed of appt at [facility]. Also reminded to take givens monitor to short stay first thing in the morning. Reiterated this was extremely important. Nurse relayed to patient. Pt verbalized understanding. Capsule results to be reviewed tomorrow when available.

## 2012-12-05 NOTE — Care Management Note (Unsigned)
    Page 1 of 1   12/05/2012     11:17:11 AM   CARE MANAGEMENT NOTE 12/05/2012  Patient:  [name], [name]   Account Number:  [id number]  Date Initiated:  12/05/2012  Documentation initiated by:  Rosemary Holms  Subjective/Objective Assessment:   Pt lives at home with spouse and independent. Anticipate DC tomorrow with no anticipated DC needs     Action/Plan:   Anticipated DC Date:  12/06/2012   Anticipated DC Plan:  HOME/SELF CARE         Choice offered to / List presented to:             Status of service:  In process, will continue to follow Medicare Important Message given?   (If response is "NO", the following Medicare IM given date fields will be blank) Date Medicare IM given:   Date Additional Medicare IM given:    Discharge Disposition:    Per UR Regulation:    If discussed at Long Length of Stay Meetings, dates discussed:    Comments:  12/05/12 Rosemary Holms RN BSN CM

## 2012-12-05 NOTE — OR Nursing (Signed)
Leslye Peer notified to have night time Nursing Supervisor remove Given's study at 2010 and download.

## 2012-12-06 ENCOUNTER — Encounter (HOSPITAL_COMMUNITY): Payer: Self-pay | Admitting: Internal Medicine

## 2012-12-06 DIAGNOSIS — K922 Gastrointestinal hemorrhage, unspecified: Secondary | ICD-10-CM | POA: Diagnosis not present

## 2012-12-06 DIAGNOSIS — D509 Iron deficiency anemia, unspecified: Secondary | ICD-10-CM

## 2012-12-06 DIAGNOSIS — K861 Other chronic pancreatitis: Secondary | ICD-10-CM | POA: Diagnosis not present

## 2012-12-06 DIAGNOSIS — K5900 Constipation, unspecified: Secondary | ICD-10-CM | POA: Diagnosis not present

## 2012-12-06 NOTE — Op Note (Signed)
Small Bowel Givens Capsule Study Procedure date:  12/05/12  Referring Provider:  Dr, Jena Gauss  PCP:  Dr. Cassell Smiles., MD  Indication for procedure:   42 year old female recently admitted to hospital secondary to acute on chronic anemia and rectal bleeding, with negative colonoscopy while inpatient, now presents for capsule study secondary to persistent anemia. Prior EGD by Dr. Teena Dunk in April 2014 with a hiatal hernia and esophagitis. She's had a trend downward in her hemoglobin from 13.4 back in December, 12.7 in February, 10 on April 29. Hgb 8.9 on admission May 8th. Iron studies reveal low ferritin at 4, Iron low at 18. She does admit to intermittent BC powder usage at last outpatient appointment, but she insists this is not routine.  Findings:   Capsule study complete and reached the cecum. Few superficial erosions noted, starting at 13:27. No evidence of overt bleeding, mass, AVMs, ulcerations.   First Gastric image:  05:59 First Duodenal image: 12:53 First Cecal image: 5:02:53 Gastric Passage time: 44m Small Bowel Passage time:  4h 54m  Summary & Recommendations: 42 year old female with acute on chronic anemia in the setting of aspirin powders; thus far negative colonoscopy by our practice, with an outside upper endoscopy reportedly negative. These operative notes were reviewed. Capsule study overall benign without evidence to explain acute IDA. Aspirin powders need to be avoided indefinitely, and patient needs to be started on oral iron supplementation. Will need to follow closely with repeat CBC, iron, and ferritin in the next 2 weeks. Consider repeat EGD by our practice in future, pending review of future labs. Consider H.pylori and celiac serologies in future.  Nira Retort, ANP-BC [facility]

## 2012-12-06 NOTE — Progress Notes (Signed)
UR chart review completed.  

## 2012-12-07 ENCOUNTER — Telehealth: Payer: Self-pay | Admitting: Gastroenterology

## 2012-12-07 MED ORDER — FERROUS SULFATE 325 (65 FE) MG PO TABS: 325.0000 mg | ORAL_TABLET | Freq: Two times a day (BID) | ORAL | Status: DC

## 2012-12-07 NOTE — Telephone Encounter (Signed)
Please let patient know we reviewed her capsule study. No areas of outright bleeding. She has a few superficial, non-bleeding erosions in the first part of her small bowel. She needs to avoid BC powders, Ibuprofen, Goody's powders, etc. Avoid indefinitely.  I would like to have her start taking supplemental iron daily. Need to recheck CBC, ferritin, and iron in 2 weeks. Have her return for an office visit at that time. Please make sure her blood work is done prior to appt, so it can be reviewed. Can cancel 5/19 appt and reschedule for 2 weeks out. (Unless she is having issues).   Start ferrous sulfate 325 mg po BID. I have sent rx to pharmacy.

## 2012-12-08 ENCOUNTER — Other Ambulatory Visit: Payer: Self-pay | Admitting: Gastroenterology

## 2012-12-08 ENCOUNTER — Telehealth: Payer: Self-pay | Admitting: Internal Medicine

## 2012-12-08 ENCOUNTER — Other Ambulatory Visit: Payer: Self-pay

## 2012-12-08 ENCOUNTER — Encounter (HOSPITAL_COMMUNITY): Admission: RE | Payer: Self-pay | Source: Ambulatory Visit

## 2012-12-08 ENCOUNTER — Ambulatory Visit (HOSPITAL_COMMUNITY): Admission: RE | Admit: 2012-12-08 | Payer: Medicare Other | Source: Ambulatory Visit | Admitting: Internal Medicine

## 2012-12-08 ENCOUNTER — Encounter (HOSPITAL_COMMUNITY): Payer: Self-pay | Admitting: Internal Medicine

## 2012-12-08 DIAGNOSIS — D509 Iron deficiency anemia, unspecified: Secondary | ICD-10-CM

## 2012-12-08 SURGERY — COLONOSCOPY
Anesthesia: Moderate Sedation

## 2012-12-08 NOTE — Telephone Encounter (Signed)
Pt is in the waiting room asking to speak with nurse or with AS. She was recently D/Ced from the hospital and is asking for something for pain. Pt has OV with [facility] on Monday scheduled. Please advise. I told patient that nurse was still at lunch and AS was seeing her afternoon appointments and I could have someone call her back, but her phone is broke and will wait until nurse gets back.

## 2012-12-08 NOTE — Telephone Encounter (Signed)
Nurse spoke with patient and explain what she needed to have done. Pt needed to cancel OV for 5/19 and is aware of new OV on 6/6 at 10 with [facility] and appt card was given to her.

## 2012-12-08 NOTE — Telephone Encounter (Signed)
Pt came by office because phone is broke. results and lab orders given to pt and ov was changed. Pt has appt with [facility] tomorrow for an EUS

## 2012-12-08 NOTE — Telephone Encounter (Signed)
Patient was requesting pain medication. We informed her we could not provide this. She has an appt at [facility] for tomorrow for a celiac plexus block.

## 2012-12-09 DIAGNOSIS — K861 Other chronic pancreatitis: Secondary | ICD-10-CM | POA: Diagnosis not present

## 2012-12-09 DIAGNOSIS — K859 Acute pancreatitis without necrosis or infection, unspecified: Secondary | ICD-10-CM | POA: Diagnosis not present

## 2012-12-10 ENCOUNTER — Emergency Department (HOSPITAL_COMMUNITY)
Admission: EM | Admit: 2012-12-10 | Discharge: 2012-12-10 | Disposition: A | Discharge status: Home / Self Care | Payer: Medicare Other | Attending: Emergency Medicine | Admitting: Emergency Medicine

## 2012-12-10 ENCOUNTER — Encounter (HOSPITAL_COMMUNITY): Payer: Self-pay | Admitting: Emergency Medicine

## 2012-12-10 DIAGNOSIS — R109 Unspecified abdominal pain: Secondary | ICD-10-CM

## 2012-12-10 DIAGNOSIS — E119 Type 2 diabetes mellitus without complications: Secondary | ICD-10-CM | POA: Diagnosis not present

## 2012-12-10 DIAGNOSIS — F329 Major depressive disorder, single episode, unspecified: Secondary | ICD-10-CM | POA: Diagnosis not present

## 2012-12-10 DIAGNOSIS — Z90710 Acquired absence of both cervix and uterus: Secondary | ICD-10-CM | POA: Insufficient documentation

## 2012-12-10 DIAGNOSIS — R1013 Epigastric pain: Secondary | ICD-10-CM | POA: Insufficient documentation

## 2012-12-10 DIAGNOSIS — F319 Bipolar disorder, unspecified: Secondary | ICD-10-CM | POA: Diagnosis not present

## 2012-12-10 DIAGNOSIS — F17200 Nicotine dependence, unspecified, uncomplicated: Secondary | ICD-10-CM | POA: Diagnosis not present

## 2012-12-10 DIAGNOSIS — E785 Hyperlipidemia, unspecified: Secondary | ICD-10-CM | POA: Diagnosis not present

## 2012-12-10 DIAGNOSIS — Z8719 Personal history of other diseases of the digestive system: Secondary | ICD-10-CM | POA: Insufficient documentation

## 2012-12-10 DIAGNOSIS — F3289 Other specified depressive episodes: Secondary | ICD-10-CM | POA: Insufficient documentation

## 2012-12-10 DIAGNOSIS — J4489 Other specified chronic obstructive pulmonary disease: Secondary | ICD-10-CM | POA: Insufficient documentation

## 2012-12-10 DIAGNOSIS — Z79899 Other long term (current) drug therapy: Secondary | ICD-10-CM | POA: Insufficient documentation

## 2012-12-10 DIAGNOSIS — M79604 Pain in right leg: Secondary | ICD-10-CM

## 2012-12-10 DIAGNOSIS — R0602 Shortness of breath: Secondary | ICD-10-CM | POA: Insufficient documentation

## 2012-12-10 DIAGNOSIS — Z880 Allergy status to penicillin: Secondary | ICD-10-CM | POA: Diagnosis not present

## 2012-12-10 DIAGNOSIS — J449 Chronic obstructive pulmonary disease, unspecified: Secondary | ICD-10-CM | POA: Diagnosis not present

## 2012-12-10 DIAGNOSIS — Z882 Allergy status to sulfonamides status: Secondary | ICD-10-CM | POA: Insufficient documentation

## 2012-12-10 DIAGNOSIS — M79609 Pain in unspecified limb: Secondary | ICD-10-CM | POA: Insufficient documentation

## 2012-12-10 DIAGNOSIS — G43909 Migraine, unspecified, not intractable, without status migrainosus: Secondary | ICD-10-CM | POA: Insufficient documentation

## 2012-12-10 LAB — CBC WITH DIFFERENTIAL/PLATELET
Basophils Relative: 0 % (ref 0–1)
Eosinophils Absolute: 0.1 10*3/uL (ref 0.0–0.7)
Eosinophils Relative: 1 % (ref 0–5)
Lymphocytes Relative: 14 % (ref 12–46)
Lymphs Abs: 1.7 10*3/uL (ref 0.7–4.0)
MCH: 23.7 pg — ABNORMAL LOW (ref 26.0–34.0)
MCHC: 30.5 g/dL (ref 30.0–36.0)
MCV: 77.8 fL — ABNORMAL LOW (ref 78.0–100.0)
Monocytes Absolute: 0.6 10*3/uL (ref 0.1–1.0)
Platelets: 270 10*3/uL (ref 150–400)
RBC: 4.09 MIL/uL (ref 3.87–5.11)
RDW: 14.4 % (ref 11.5–15.5)

## 2012-12-10 LAB — TYPE AND SCREEN
ABO/RH(D): A POS
Antibody Screen: NEGATIVE

## 2012-12-10 LAB — COMPREHENSIVE METABOLIC PANEL
ALT: 9 U/L (ref 0–35)
AST: 13 U/L (ref 0–37)
Alkaline Phosphatase: 69 U/L (ref 39–117)
CO2: 23 mEq/L (ref 19–32)
Calcium: 9.2 mg/dL (ref 8.4–10.5)
Chloride: 104 mEq/L (ref 96–112)
GFR calc Af Amer: >90 mL/min (ref >90)
GFR calc non Af Amer: >90 mL/min (ref >90)
Glucose, Bld: 121 mg/dL — ABNORMAL HIGH (ref 70–99)
Sodium: 138 mEq/L (ref 135–145)
Total Bilirubin: 0.3 mg/dL (ref 0.3–1.2)

## 2012-12-10 LAB — LIPASE, BLOOD: Lipase: 30 U/L (ref 11–59)

## 2012-12-10 MED ORDER — OXYCODONE-ACETAMINOPHEN 5-325 MG PO TABS
2.0000 | ORAL_TABLET | Freq: Once | ORAL | Status: AC
  Administered 2012-12-10: 2 via ORAL
  Filled 2012-12-10: qty 2

## 2012-12-10 MED ORDER — FENTANYL CITRATE 0.05 MG/ML IJ SOLN
50.0000 ug | Freq: Once | INTRAMUSCULAR | Status: AC
  Administered 2012-12-10: 50 ug via INTRAVENOUS
  Filled 2012-12-10: qty 2

## 2012-12-10 NOTE — ED Provider Notes (Signed)
History     CSN: [id number]  Arrival date & time 12/10/12  2952   First MD Initiated Contact with Patient 12/10/12 [phone]      Chief Complaint  Patient presents with  . Shortness of Breath  . Leg Pain   HPI [name] is a 42 y.o. female history of chronic idiopathic pancreatitis presents with epigastric pain, mild shortness of breath, bilateral leg pain. She says she gets this bilateral leg pain and shortness of breath when her "blood counts are low.". Patient has a history of anemia, she's had colonoscopies and is unsure where she is chronically losing blood from.  Patient was at [facility] today and had a celiac block  For her chronic pancreatitis pain, she says there was an error in her prescriptions and cannot have her Percocet prescription until Monday.  Patient's pain is moderate to severe, sharp, radiates to the back in the epigastrium suggestive of her prior pain, no nausea vomiting or diarrhea. No chest pain.  Denies headaches, fevers or chills.   Past Medical History  Diagnosis Date  . Asthma   . Depression   . Migraine   . Pancreatitis chronic Idiopathic    Treated by Dr. Margaretha Glassing at [facility] in the past with celiac blocks.  . Bipolar 1 disorder   . Hyperlipidemia 02/03/2012  . Diabetes mellitus   . COPD (chronic obstructive pulmonary disease)   . Neck pain 06/08/2012    Past Surgical History  Procedure Laterality Date  . Cholecystectomy    . Tonsillectomy    . Abdominal hysterectomy    . Esophagogastroduodenoscopy  02/20/2008    KGM:[id number] distal esophageal mucosa, suspicious for neoplasm/Hiatal hernia otherwise normal   . Esophagogastroduodenoscopy  04/03/2008    UYQ:[id number] hiatal hernia, otherwise normal/Short, tight, benign-appearing peptic stricture  . Ileocolonoscopy  06/05/2008      FIE:[id number] anal canal, otherwise normal rectum, colon  . Esophagogastroduodenoscopy  November 16, 2012    Dr. Teena Dunk: hiatal hernia, esophagitis,   .  Givens capsule study N/A 12/05/2012    Procedure: GIVENS CAPSULE STUDY;  Surgeon: Corbin Ade, MD;  Location: [facility];  Service: Endoscopy;  Laterality: N/A;  . Colonoscopy N/A 12/05/2012    Procedure: COLONOSCOPY;  Surgeon: Corbin Ade, MD;  Location: [facility];  Service: Endoscopy;  Laterality: N/A;    Family History  Problem Relation Age of Onset  . Asthma Other   . Coronary artery disease Neg Hx   . Colon cancer Neg Hx     History  Substance Use Topics  . Smoking status: Current Every Day Smoker -- 1.00 packs/day for 15 years    Types: Cigarettes  . Smokeless tobacco: Former Neurosurgeon  . Alcohol Use: No    OB History   Grav Para Term Preterm Abortions TAB SAB Ect Mult Living                  Review of Systems At least 10pt or greater review of systems completed and are negative except where specified in the HPI.  Allergies  Penicillins; Sulfa antibiotics; and Prednisone  Home Medications   Current Outpatient Rx  Name  Route  Sig  Dispense  Refill  . albuterol (PROVENTIL HFA;VENTOLIN HFA) 108 (90 BASE) MCG/ACT inhaler   Inhalation   Inhale 2 puffs into the lungs daily as needed. For shortness of breath         . albuterol (PROVENTIL) (2.5 MG/3ML) 0.083% nebulizer solution   Nebulization  Take 2.5 mg by nebulization every 6 (six) hours as needed. For shortness of breath          . amLODipine (NORVASC) 10 MG tablet   Oral   Take 10 mg by mouth every morning.          . clonazePAM (KLONOPIN) 0.5 MG tablet   Oral   Take 0.5 mg by mouth 3 (three) times daily as needed. For anxiety         . docusate sodium (COLACE) 100 MG capsule   Oral   Take 100 mg by mouth at bedtime.          . ferrous sulfate 325 (65 FE) MG tablet   Oral   Take 1 tablet (325 mg total) by mouth 2 (two) times daily.   60 tablet   3   . gabapentin (NEURONTIN) 300 MG capsule   Oral   Take 600 mg by mouth 3 (three) times daily.          . Linaclotide 290 MCG  CAPS   Oral   Take 1 capsule by mouth daily.   30 capsule   3   . ondansetron (ZOFRAN) 4 MG tablet   Oral   Take 1 tablet (4 mg total) by mouth every 8 (eight) hours as needed for nausea.   30 tablet   1   . Pancrelipase, Lip-Prot-Amyl, (ZENPEP) 20000 UNITS CPEP   Oral   Take 2 capsules (40,000 Units total) by mouth 3 (three) times daily with meals. 1 with snacks   240 capsule   3   . pantoprazole (PROTONIX) 40 MG tablet   Oral   Take 1 tablet (40 mg total) by mouth 2 (two) times daily.   60 tablet   3   . polyethylene glycol-electrolytes (TRILYTE) 420 G solution   Oral   Take 4,000 mLs by mouth as directed.   4000 mL   0   . promethazine (PHENERGAN) 25 MG tablet   Oral   Take 1 tablet (25 mg total) by mouth every 6 (six) hours as needed for nausea.   30 tablet   0   . sertraline (ZOLOFT) 25 MG tablet   Oral   Take 25 mg by mouth at bedtime.           BP 145/78  Pulse 85  Temp(Src) 99.2 F (37.3 C) (Oral)  Resp 20  Ht 5\' 2"  (1.575 m)  Wt 165 lb (74.844 kg)  BMI 30.17 kg/m2  SpO2 98%  Physical Exam  Nursing notes reviewed.  Electronic medical record reviewed. VITAL SIGNS:   Filed Vitals:   12/10/12 0252  BP: 145/78  Pulse: 85  Temp: 99.2 F (37.3 C)  TempSrc: Oral  Resp: 20  Height: 5\' 2"  (1.575 m)  Weight: 165 lb (74.844 kg)  SpO2: 98%   CONSTITUTIONAL: Awake, oriented, appears non-toxic HENT: Atraumatic, normocephalic, oral mucosa pink and moist, airway patent. Nares patent without drainage. External ears normal. EYES: Conjunctiva clear, EOMI, PERRLA NECK: Trachea midline, non-tender, supple CARDIOVASCULAR: Normal heart rate, Normal rhythm, No murmurs, rubs, gallops PULMONARY/CHEST: Clear to auscultation, no rhonchi, wheezes, or rales. Symmetrical breath sounds. Non-tender. ABDOMINAL: Non-distended, soft, non-tender - no rebound or guarding.  BS normal. NEUROLOGIC: Non-focal, moving all four extremities, no gross sensory or motor  deficits. EXTREMITIES: No clubbing, cyanosis, or edema SKIN: Warm, Dry, No erythema, No rash  ED Course  Procedures (including critical care time)  Labs Reviewed  CBC WITH DIFFERENTIAL - Abnormal;  Notable for the following:    WBC 12.8 (*)    Hemoglobin 9.7 (*)    HCT 31.8 (*)    MCV 77.8 (*)    MCH 23.7 (*)    Neutrophils Relative % 81 (*)    Neutro Abs 10.3 (*)    All other components within normal limits  COMPREHENSIVE METABOLIC PANEL - Abnormal; Notable for the following:    Glucose, Bld 121 (*)    All other components within normal limits  LIPASE, BLOOD  TYPE AND SCREEN   No results found.   1. ABDOMINAL PAIN   2. Leg pain, bilateral       MDM  Patient presents with her typical acute on chronic pancreatitis pain, she was told that it may take a few days for the celiac block to take effect. Patient is out of pain medicine at home, treat her pain here, discussed no prescription for Percocet for chronic pain medicines.  Lipase is within normal limits, H&H is better than it has been in the past, CMP is unremarkable.  Vital signs are stable and within normal limits, she is nontoxic, afebrile, not tachypneic, no hypoxia, I think this patient's subjective shortness of breath is related to anxiety, I do not suspect pulmonary embolism, her legs are nontender to palpation they're not swollen-I do not suspect DVT. Patient is overall at low risk for venous thromboembolic disease.  I have reviewed records of multiple ED visits with similar or other pain related complaints, usually with negative workups. I feel that the patient's pain is chronic and was appropriately treated in the emergency department setting; however, I will not refill her prescription which she says she cannot get until Monday.   I have explicitly discussed with the patient return precautions and have reassured patient that they can always be seen and evaluated in the emergency department for any condition that  they feel is emergent, and that they will be given treatment as the EDP feels is appropriate and safe, that may or may not involve the use of narcotic pain medications.  The patient was given the opportunity to voice any further questions or concerns and these were addressed to the best of my ability.        Jones Skene, MD 12/10/12 [id number]

## 2012-12-10 NOTE — ED Notes (Signed)
Patient with no complaints at this time. Respirations even and unlabored. Skin warm/dry. Discharge instructions reviewed with patient at this time. Patient given opportunity to voice concerns/ask questions. IV removed per policy and band-aid applied to site. Patient discharged at this time and left Emergency Department with steady gait.  

## 2012-12-10 NOTE — ED Notes (Addendum)
Patient complaining of left sided abdominal pain with shortness of breath and bilateral leg pain. States she was discharged Monday from here for pancreatitis and was seen at [facility] yesterday for a "septic block in my pancreas."

## 2012-12-12 ENCOUNTER — Ambulatory Visit: Payer: Medicare Other | Admitting: Gastroenterology

## 2012-12-13 DIAGNOSIS — G8929 Other chronic pain: Secondary | ICD-10-CM | POA: Diagnosis not present

## 2012-12-13 DIAGNOSIS — K921 Melena: Secondary | ICD-10-CM | POA: Diagnosis not present

## 2012-12-13 DIAGNOSIS — Z6829 Body mass index (BMI) 29.0-29.9, adult: Secondary | ICD-10-CM | POA: Diagnosis not present

## 2012-12-13 DIAGNOSIS — K861 Other chronic pancreatitis: Secondary | ICD-10-CM | POA: Diagnosis not present

## 2012-12-29 ENCOUNTER — Encounter: Payer: Self-pay | Admitting: Internal Medicine

## 2012-12-29 DIAGNOSIS — D509 Iron deficiency anemia, unspecified: Secondary | ICD-10-CM | POA: Diagnosis not present

## 2012-12-29 LAB — CBC WITH DIFFERENTIAL/PLATELET
Basophils Absolute: 0 10*3/uL (ref 0.0–0.1)
Basophils Relative: 0 % (ref 0–1)
Eosinophils Absolute: 0.3 10*3/uL (ref 0.0–0.7)
Eosinophils Relative: 3 % (ref 0–5)
MCH: 24.2 pg — ABNORMAL LOW (ref 26.0–34.0)
MCHC: 31 g/dL (ref 30.0–36.0)
MCV: 78 fL (ref 78.0–100.0)
Neutro Abs: 6.2 10*3/uL (ref 1.7–7.7)
Neutrophils Relative %: 71 % (ref 43–77)
Platelets: 358 10*3/uL (ref 150–400)
RDW: 21 % — ABNORMAL HIGH (ref 11.5–15.5)

## 2012-12-29 LAB — FERRITIN: Ferritin: 26 ng/mL (ref 10–291)

## 2012-12-30 ENCOUNTER — Ambulatory Visit: Payer: Medicare Other | Admitting: Gastroenterology

## 2013-01-02 NOTE — Progress Notes (Signed)
Quick Note:  Labs reviewed: good news, Hgb is normal now. Iron and ferritin improved dramatically. Continue iron once daily now. Keep f/u appt with Tana Coast, PA. Consider H.pylori and/or celiac serologies at next appt. Last EGD done by Dr. Teena Dunk, may need repeat EGD with [place] if recurrent anemia, evidence of melena, etc.   NO ASPIRIN POWDERS. ______

## 2013-01-03 ENCOUNTER — Telehealth: Payer: Self-pay

## 2013-01-03 MED ORDER — LUBIPROSTONE 24 MCG PO CAPS: 24.0000 ug | ORAL_CAPSULE | Freq: Two times a day (BID) | ORAL | Status: DC

## 2013-01-03 NOTE — Telephone Encounter (Signed)
RX for Amitiza but patient needs to keep her f/u appt.

## 2013-01-03 NOTE — Telephone Encounter (Signed)
Received PA request from the pharmacy for Linzess. pts insurance prefers Amitiza. Spoke with pt- she has never tried [place] and she stated the Linzess wasn't working good for her anyway. She would like to try rx for Amitiza.

## 2013-01-03 NOTE — Telephone Encounter (Signed)
Pt has ov on 01/18/13

## 2013-01-12 DIAGNOSIS — G8929 Other chronic pain: Secondary | ICD-10-CM | POA: Diagnosis not present

## 2013-01-12 DIAGNOSIS — Z6829 Body mass index (BMI) 29.0-29.9, adult: Secondary | ICD-10-CM | POA: Diagnosis not present

## 2013-01-12 DIAGNOSIS — R7309 Other abnormal glucose: Secondary | ICD-10-CM | POA: Diagnosis not present

## 2013-01-18 ENCOUNTER — Ambulatory Visit: Payer: Medicare Other | Admitting: Gastroenterology

## 2013-01-26 DIAGNOSIS — R112 Nausea with vomiting, unspecified: Secondary | ICD-10-CM | POA: Diagnosis not present

## 2013-01-26 DIAGNOSIS — I10 Essential (primary) hypertension: Secondary | ICD-10-CM | POA: Diagnosis not present

## 2013-01-26 DIAGNOSIS — R1084 Generalized abdominal pain: Secondary | ICD-10-CM | POA: Diagnosis not present

## 2013-01-26 DIAGNOSIS — Z79899 Other long term (current) drug therapy: Secondary | ICD-10-CM | POA: Diagnosis not present

## 2013-01-26 DIAGNOSIS — F17200 Nicotine dependence, unspecified, uncomplicated: Secondary | ICD-10-CM | POA: Diagnosis not present

## 2013-01-26 DIAGNOSIS — R109 Unspecified abdominal pain: Secondary | ICD-10-CM | POA: Diagnosis not present

## 2013-01-26 DIAGNOSIS — J449 Chronic obstructive pulmonary disease, unspecified: Secondary | ICD-10-CM | POA: Diagnosis not present

## 2013-01-26 DIAGNOSIS — K7689 Other specified diseases of liver: Secondary | ICD-10-CM | POA: Diagnosis not present

## 2013-02-07 ENCOUNTER — Encounter: Payer: Self-pay | Admitting: Gastroenterology

## 2013-02-07 ENCOUNTER — Ambulatory Visit (INDEPENDENT_AMBULATORY_CARE_PROVIDER_SITE_OTHER): Payer: Medicare Other | Admitting: Gastroenterology

## 2013-02-07 VITALS — BP 114/82 | HR 89 | Temp 98.2°F | Ht 62.0 in | Wt 162.2 lb

## 2013-02-07 DIAGNOSIS — K5900 Constipation, unspecified: Secondary | ICD-10-CM | POA: Diagnosis not present

## 2013-02-07 DIAGNOSIS — K859 Acute pancreatitis without necrosis or infection, unspecified: Secondary | ICD-10-CM | POA: Diagnosis not present

## 2013-02-07 DIAGNOSIS — D509 Iron deficiency anemia, unspecified: Secondary | ICD-10-CM | POA: Diagnosis not present

## 2013-02-07 MED ORDER — PANCRELIPASE (LIP-PROT-AMYL) 20000-68000 UNITS PO CPEP: 3.0000 | ORAL_CAPSULE | Freq: Three times a day (TID) | ORAL | Status: DC

## 2013-02-07 NOTE — Progress Notes (Signed)
Cc PCP 

## 2013-02-07 NOTE — Assessment & Plan Note (Signed)
Recurrent anemia, IDA as outlined above. Recheck labs. Avoid ASA products. Check celiac and H.pylori as previously suggested.

## 2013-02-07 NOTE — Progress Notes (Signed)
Primary Care Physician: Cassell Smiles., MD  Primary Gastroenterologist:  Roetta Sessions, MD  Chief Complaint  Patient presents with  . Pancreatitis    HPI: [name] is a 42 y.o. female here for followup visit. She was last in the office in April 2014. She is a history of idiopathic pancreatitis previously managed by Dr. Harlen Labs at [facility]. Last celiac block in 11/2012. More recently we have been addressing her iron deficiency anemia. EGD performed by Dr. Teena Dunk in April with hiatal hernia and esophagitis. A CT on admission at [facility] showed small hiatal hernia with questionable wall thickening of the distal esophagus/GE junction. Slightly increased stool throughout the colon with a question of foreign bodies. Second CT 12/02/12 showed large volume of formed stool throughout colon, moderate hiatal hernia and no foreign bodies. She had a colonoscopy by Dr. Jena Gauss with normal terminal ileum, colon, rectum. Small bowel capsule endoscopy failed to reveal a cause for iron deficiency anemia. She was advised to definitely avoid aspirin powders. She has previously denied excessive use.  Has had hematemesis and brbpr, but none since May. Has not had transfusion this year. Has back pain from previous fracture and feels like referred to stomach. ?related to pancreatitis. Last episode about two weeks ago. Usually celiac block lasts for several months. Has been average about once per year. BM every couple of days. Some brbpr. Stools are soft. No recent hematemesis.   Current Outpatient Prescriptions  Medication Sig Dispense Refill  . albuterol (PROVENTIL HFA;VENTOLIN HFA) 108 (90 BASE) MCG/ACT inhaler Inhale 2 puffs into the lungs daily as needed. For shortness of breath      . albuterol (PROVENTIL) (2.5 MG/3ML) 0.083% nebulizer solution Take 2.5 mg by nebulization every 6 (six) hours as needed. For shortness of breath       . amLODipine (NORVASC) 10 MG tablet Take 10 mg by mouth every morning.        . clonazePAM (KLONOPIN) 0.5 MG tablet Take 0.5 mg by mouth 3 (three) times daily as needed. For anxiety      . docusate sodium (COLACE) 100 MG capsule Take 100 mg by mouth at bedtime.       . ferrous sulfate 325 (65 FE) MG tablet Take 1 tablet (325 mg total) by mouth 2 (two) times daily.  60 tablet  3  . gabapentin (NEURONTIN) 300 MG capsule Take 600 mg by mouth 3 (three) times daily.       Marland Kitchen lubiprostone (AMITIZA) 24 MCG capsule Take 1 capsule (24 mcg total) by mouth 2 (two) times daily with a meal.  60 capsule  3  . ondansetron (ZOFRAN) 4 MG tablet Take 1 tablet (4 mg total) by mouth every 8 (eight) hours as needed for nausea.  30 tablet  1  . Pancrelipase, Lip-Prot-Amyl, (ZENPEP) 20000 UNITS CPEP Take 2 capsules (40,000 Units total) by mouth 3 (three) times daily with meals. 1 with snacks  240 capsule  3  . pantoprazole (PROTONIX) 40 MG tablet Take 1 tablet (40 mg total) by mouth 2 (two) times daily.  60 tablet  3  . promethazine (PHENERGAN) 25 MG tablet Take 1 tablet (25 mg total) by mouth every 6 (six) hours as needed for nausea.  30 tablet  0  . sertraline (ZOLOFT) 25 MG tablet Take 25 mg by mouth at bedtime.       No current facility-administered medications for this visit.    Allergies as of 02/07/2013 - Review Complete 02/07/2013  Allergen Reaction Noted  .  Penicillins Shortness Of Breath   . Sulfa antibiotics Shortness Of Breath 04/21/2011  . Prednisone Other (See Comments) 07/14/2011    ROS:  General: Negative for anorexia, weight loss, fever, chills, fatigue, weakness. ENT: Negative for hoarseness, difficulty swallowing , nasal congestion. CV: Negative for chest pain, angina, palpitations, dyspnea on exertion, peripheral edema.  Respiratory: Negative for dyspnea at rest, dyspnea on exertion, cough, sputum, wheezing.  GI: See history of present illness. GU:  Negative for dysuria, hematuria, urinary incontinence, urinary frequency, nocturnal urination.  Endo: Negative for  unusual weight change.    Physical Examination:   BP 114/82  Pulse 89  Temp(Src) 98.2 F (36.8 C) (Oral)  Ht 5\' 2"  (1.575 m)  Wt 162 lb 3.2 oz (73.573 kg)  BMI 29.66 kg/m2  General: Well-nourished, well-developed in no acute distress.  Eyes: No icterus. Mouth: Oropharyngeal mucosa moist and pink , no lesions erythema or exudate. Lungs: Clear to auscultation bilaterally.  Heart: Regular rate and rhythm, no murmurs rubs or gallops.  Abdomen: Bowel sounds are normal, nontender, nondistended, no hepatosplenomegaly or masses, no abdominal bruits or hernia , no rebound or guarding.   Extremities: No lower extremity edema. No clubbing or deformities. Neuro: Alert and oriented x 4   Skin: Warm and dry, no jaundice.   Psych: Alert and cooperative, normal mood and affect.  Labs:  Lab Results  Component Value Date   IRON 412* 12/29/2012   TIBC 374 12/05/2012   FERRITIN 26 12/29/2012   Lab Results  Component Value Date   FERRITIN 26 12/29/2012   Lab Results  Component Value Date   WBC 12.8* 12/10/2012   HGB 9.7* 12/10/2012   HCT 31.8* 12/10/2012   MCV 77.8* 12/10/2012   PLT 270 12/10/2012     Lab Results  Component Value Date   LIPASE 30 12/10/2012   Lab Results  Component Value Date   CREATININE 0.54 12/10/2012   BUN 7 12/10/2012   NA 138 12/10/2012   K 3.5 12/10/2012   CL 104 12/10/2012   CO2 23 12/10/2012   Lab Results  Component Value Date   ALT 9 12/10/2012   AST 13 12/10/2012   ALKPHOS 69 12/10/2012   BILITOT 0.3 12/10/2012    Imaging Studies: No results found.

## 2013-02-07 NOTE — Assessment & Plan Note (Signed)
Continue to follow up with [facility] for idiopathic pancreatitis. Increase pancreatic enzymes to 60,000 with meals and 40981 with snacks. New RX provided.

## 2013-02-07 NOTE — Patient Instructions (Addendum)
1. Please have your blood work done. We will call you with results. 2. No aspirin or aspirin powders. 3. Increase your pancreatic enzymes to 3 with meals and 2 with snacks. New prescription sent to your pharmacy. 4. Office visit with Dr. Jena Gauss in 3 months.

## 2013-02-07 NOTE — Assessment & Plan Note (Signed)
Continue current regimen

## 2013-02-09 DIAGNOSIS — F411 Generalized anxiety disorder: Secondary | ICD-10-CM | POA: Diagnosis not present

## 2013-02-09 DIAGNOSIS — Z6829 Body mass index (BMI) 29.0-29.9, adult: Secondary | ICD-10-CM | POA: Diagnosis not present

## 2013-02-09 DIAGNOSIS — E039 Hypothyroidism, unspecified: Secondary | ICD-10-CM | POA: Diagnosis not present

## 2013-02-09 DIAGNOSIS — K5900 Constipation, unspecified: Secondary | ICD-10-CM | POA: Diagnosis not present

## 2013-02-09 DIAGNOSIS — E785 Hyperlipidemia, unspecified: Secondary | ICD-10-CM | POA: Diagnosis not present

## 2013-02-09 DIAGNOSIS — K859 Acute pancreatitis without necrosis or infection, unspecified: Secondary | ICD-10-CM | POA: Diagnosis not present

## 2013-02-09 DIAGNOSIS — G43009 Migraine without aura, not intractable, without status migrainosus: Secondary | ICD-10-CM | POA: Diagnosis not present

## 2013-02-09 DIAGNOSIS — D509 Iron deficiency anemia, unspecified: Secondary | ICD-10-CM | POA: Diagnosis not present

## 2013-02-09 LAB — CBC WITH DIFFERENTIAL/PLATELET
Basophils Absolute: 0 10*3/uL (ref 0.0–0.1)
Basophils Relative: 0 % (ref 0–1)
Eosinophils Relative: 6 % — ABNORMAL HIGH (ref 0–5)
Lymphs Abs: 1.8 10*3/uL (ref 0.7–4.0)
MCH: 26.1 pg (ref 26.0–34.0)
MCHC: 31.2 g/dL (ref 30.0–36.0)
MCV: 83.6 fL (ref 78.0–100.0)
Neutro Abs: 5.2 10*3/uL (ref 1.7–7.7)
Neutrophils Relative %: 65 % (ref 43–77)
Platelets: 257 10*3/uL (ref 150–400)
RDW: 19.7 % — ABNORMAL HIGH (ref 11.5–15.5)

## 2013-02-09 LAB — IRON AND TIBC: Iron: 40 ug/dL — ABNORMAL LOW (ref 42–145)

## 2013-02-09 LAB — FERRITIN: Ferritin: 18 ng/mL (ref 10–291)

## 2013-02-13 NOTE — Progress Notes (Signed)
Quick Note:  Labs are looking stable. Her H/H is actually up nicely but ferritin and iron low normal. No celiac or H.Pylori.  Continue iron for now. CBC, ferritin , iron/TIBC in 3 months. ______

## 2013-02-23 ENCOUNTER — Other Ambulatory Visit: Payer: Self-pay

## 2013-02-23 DIAGNOSIS — D62 Acute posthemorrhagic anemia: Secondary | ICD-10-CM

## 2013-02-23 DIAGNOSIS — R197 Diarrhea, unspecified: Secondary | ICD-10-CM

## 2013-02-23 DIAGNOSIS — D509 Iron deficiency anemia, unspecified: Secondary | ICD-10-CM

## 2013-02-23 DIAGNOSIS — R109 Unspecified abdominal pain: Secondary | ICD-10-CM

## 2013-02-27 ENCOUNTER — Emergency Department (HOSPITAL_COMMUNITY)
Admission: EM | Admit: 2013-02-27 | Discharge: 2013-02-27 | Disposition: A | Discharge status: Home / Self Care | Payer: Medicare Other | Attending: Emergency Medicine | Admitting: Emergency Medicine

## 2013-02-27 ENCOUNTER — Emergency Department (HOSPITAL_COMMUNITY): Payer: Medicare Other

## 2013-02-27 ENCOUNTER — Encounter (HOSPITAL_COMMUNITY): Payer: Self-pay | Admitting: Emergency Medicine

## 2013-02-27 DIAGNOSIS — F17200 Nicotine dependence, unspecified, uncomplicated: Secondary | ICD-10-CM | POA: Insufficient documentation

## 2013-02-27 DIAGNOSIS — Z3202 Encounter for pregnancy test, result negative: Secondary | ICD-10-CM | POA: Diagnosis not present

## 2013-02-27 DIAGNOSIS — M7989 Other specified soft tissue disorders: Secondary | ICD-10-CM | POA: Diagnosis not present

## 2013-02-27 DIAGNOSIS — R109 Unspecified abdominal pain: Secondary | ICD-10-CM | POA: Diagnosis present

## 2013-02-27 DIAGNOSIS — F411 Generalized anxiety disorder: Secondary | ICD-10-CM | POA: Diagnosis not present

## 2013-02-27 DIAGNOSIS — Z8719 Personal history of other diseases of the digestive system: Secondary | ICD-10-CM | POA: Insufficient documentation

## 2013-02-27 DIAGNOSIS — E119 Type 2 diabetes mellitus without complications: Secondary | ICD-10-CM | POA: Diagnosis not present

## 2013-02-27 DIAGNOSIS — F329 Major depressive disorder, single episode, unspecified: Secondary | ICD-10-CM | POA: Insufficient documentation

## 2013-02-27 DIAGNOSIS — G43909 Migraine, unspecified, not intractable, without status migrainosus: Secondary | ICD-10-CM | POA: Insufficient documentation

## 2013-02-27 DIAGNOSIS — S99919A Unspecified injury of unspecified ankle, initial encounter: Secondary | ICD-10-CM | POA: Diagnosis not present

## 2013-02-27 DIAGNOSIS — Z880 Allergy status to penicillin: Secondary | ICD-10-CM | POA: Insufficient documentation

## 2013-02-27 DIAGNOSIS — F319 Bipolar disorder, unspecified: Secondary | ICD-10-CM | POA: Insufficient documentation

## 2013-02-27 DIAGNOSIS — J45909 Unspecified asthma, uncomplicated: Secondary | ICD-10-CM | POA: Diagnosis not present

## 2013-02-27 DIAGNOSIS — Z862 Personal history of diseases of the blood and blood-forming organs and certain disorders involving the immune mechanism: Secondary | ICD-10-CM | POA: Diagnosis not present

## 2013-02-27 DIAGNOSIS — J449 Chronic obstructive pulmonary disease, unspecified: Secondary | ICD-10-CM | POA: Insufficient documentation

## 2013-02-27 DIAGNOSIS — Z79899 Other long term (current) drug therapy: Secondary | ICD-10-CM | POA: Insufficient documentation

## 2013-02-27 DIAGNOSIS — S99929A Unspecified injury of unspecified foot, initial encounter: Secondary | ICD-10-CM | POA: Diagnosis not present

## 2013-02-27 DIAGNOSIS — Z8639 Personal history of other endocrine, nutritional and metabolic disease: Secondary | ICD-10-CM | POA: Insufficient documentation

## 2013-02-27 DIAGNOSIS — M79609 Pain in unspecified limb: Secondary | ICD-10-CM | POA: Diagnosis not present

## 2013-02-27 DIAGNOSIS — M25579 Pain in unspecified ankle and joints of unspecified foot: Secondary | ICD-10-CM | POA: Diagnosis not present

## 2013-02-27 DIAGNOSIS — F419 Anxiety disorder, unspecified: Secondary | ICD-10-CM | POA: Diagnosis present

## 2013-02-27 DIAGNOSIS — J4489 Other specified chronic obstructive pulmonary disease: Secondary | ICD-10-CM | POA: Insufficient documentation

## 2013-02-27 DIAGNOSIS — F3289 Other specified depressive episodes: Secondary | ICD-10-CM | POA: Insufficient documentation

## 2013-02-27 DIAGNOSIS — T43205A Adverse effect of unspecified antidepressants, initial encounter: Secondary | ICD-10-CM | POA: Insufficient documentation

## 2013-02-27 LAB — CBC WITH DIFFERENTIAL/PLATELET
Eosinophils Absolute: 0.1 10*3/uL (ref 0.0–0.7)
Eosinophils Relative: 1 % (ref 0–5)
Hemoglobin: 14.4 g/dL (ref 12.0–15.0)
Lymphs Abs: 1.6 10*3/uL (ref 0.7–4.0)
MCH: 28.6 pg (ref 26.0–34.0)
MCHC: 33.4 g/dL (ref 30.0–36.0)
MCV: 85.5 fL (ref 78.0–100.0)
Monocytes Relative: 6 % (ref 3–12)
Neutrophils Relative %: 78 % — ABNORMAL HIGH (ref 43–77)
Platelets: 244 10*3/uL (ref 150–400)
RBC: 5.04 MIL/uL (ref 3.87–5.11)
RDW: 16.5 % — ABNORMAL HIGH (ref 11.5–15.5)

## 2013-02-27 LAB — COMPREHENSIVE METABOLIC PANEL
AST: 17 U/L (ref 0–37)
BUN: 14 mg/dL (ref 6–23)
CO2: 23 mEq/L (ref 19–32)
Calcium: 9.9 mg/dL (ref 8.4–10.5)
Chloride: 102 mEq/L (ref 96–112)
Creatinine, Ser: 0.69 mg/dL (ref 0.50–1.10)
GFR calc Af Amer: >90 mL/min (ref >90)
Glucose, Bld: 101 mg/dL — ABNORMAL HIGH (ref 70–99)
Total Bilirubin: 0.5 mg/dL (ref 0.3–1.2)
Total Protein: 7.5 g/dL (ref 6.0–8.3)

## 2013-02-27 LAB — LIPASE, BLOOD: Lipase: 22 U/L (ref 11–59)

## 2013-02-27 MED ORDER — POTASSIUM CHLORIDE CRYS ER 20 MEQ PO TBCR
40.0000 meq | EXTENDED_RELEASE_TABLET | Freq: Once | ORAL | Status: AC
  Administered 2013-02-27: 40 meq via ORAL
  Filled 2013-02-27: qty 2

## 2013-02-27 MED ORDER — SODIUM CHLORIDE 0.9 % IV BOLUS (SEPSIS)
1000.0000 mL | INTRAVENOUS | Status: AC
  Administered 2013-02-27: 1000 mL via INTRAVENOUS

## 2013-02-27 MED ORDER — HYDROXYZINE HCL 25 MG PO TABS: 25.0000 mg | ORAL_TABLET | Freq: Four times a day (QID) | ORAL | Status: DC | PRN

## 2013-02-27 MED ORDER — LORAZEPAM 2 MG/ML IJ SOLN
1.0000 mg | Freq: Once | INTRAMUSCULAR | Status: AC
  Administered 2013-02-27: 1 mg via INTRAVENOUS
  Filled 2013-02-27: qty 1

## 2013-02-27 MED ORDER — ONDANSETRON 4 MG PO TBDP
4.0000 mg | ORAL_TABLET | Freq: Once | ORAL | Status: AC
  Administered 2013-02-27: 4 mg via ORAL
  Filled 2013-02-27: qty 1

## 2013-02-27 NOTE — ED Provider Notes (Signed)
CSN: [id number]     Arrival date & time 02/27/13  1113 History     First MD Initiated Contact with Patient 02/27/13 1134     Chief Complaint  Patient presents with  . Anxiety  . Medication Reaction   (Consider location/radiation/quality/duration/timing/severity/associated sxs/prior Treatment) Patient is a 42 y.o. female presenting with anxiety. The history is provided by the patient.  Anxiety This is a new problem. Episode onset: worsening over last few days. The problem occurs constantly. The problem has not changed since onset.Associated symptoms include abdominal pain (mild, started today). Pertinent negatives include no chest pain, no headaches and no shortness of breath. Nothing aggravates the symptoms. Nothing relieves the symptoms. She has tried nothing for the symptoms. The treatment provided no relief.    Past Medical History  Diagnosis Date  . Asthma   . Depression   . Migraine   . Pancreatitis chronic Idiopathic    Treated by [facility] Margaretha Glassing at [facility] in the past with celiac blocks.  . Bipolar 1 disorder   . Hyperlipidemia 02/03/2012  . Diabetes mellitus   . COPD (chronic obstructive pulmonary disease)   . Neck pain 06/08/2012   Past Surgical History  Procedure Laterality Date  . Cholecystectomy    . Tonsillectomy    . Abdominal hysterectomy    . Esophagogastroduodenoscopy  02/20/2008    WUJ:[id number] distal esophageal mucosa, suspicious for neoplasm/Hiatal hernia otherwise normal   . Esophagogastroduodenoscopy  04/03/2008    NFA:[id number] hiatal hernia, otherwise normal/Short, tight, benign-appearing peptic stricture  . Ileocolonoscopy  06/05/2008      EXB:MWUXLKG anal canal, otherwise normal rectum, colon  . Esophagogastroduodenoscopy  November 16, 2012    Dr. Teena Dunk: hiatal hernia, esophagitis,   . Givens capsule study N/A 12/05/2012    Few superficial erosions but nothing found to explain iron deficiency anemia.   . Colonoscopy N/A 12/05/2012   MWN:UUVOZD rectum, colon and terminal ileum   Family History  Problem Relation Age of Onset  . Asthma Other   . Coronary artery disease Neg Hx   . Colon cancer Neg Hx    History  Substance Use Topics  . Smoking status: Current Every Day Smoker -- 1.00 packs/day for 15 years    Types: Cigarettes  . Smokeless tobacco: Former Neurosurgeon  . Alcohol Use: No   OB History   Grav Para Term Preterm Abortions TAB SAB Ect Mult Living                 Review of Systems  Constitutional: Positive for appetite change. Negative for fever and fatigue.  HENT: Negative for congestion, drooling and neck pain.   Eyes: Negative for pain.  Respiratory: Negative for cough and shortness of breath.   Cardiovascular: Negative for chest pain.  Gastrointestinal: Positive for abdominal pain (mild, started today). Negative for nausea, vomiting and diarrhea.  Genitourinary: Negative for dysuria and hematuria.  Musculoskeletal: Negative for back pain and gait problem.  Skin: Negative for color change.  Neurological: Negative for dizziness and headaches.  Hematological: Negative for adenopathy.  Psychiatric/Behavioral: Negative for behavioral problems. The patient is nervous/anxious.   All other systems reviewed and are negative.    Allergies  Penicillins; Sulfa antibiotics; and Prednisone  Home Medications   Current Outpatient Rx  Name  Route  Sig  Dispense  Refill  . albuterol (PROVENTIL HFA;VENTOLIN HFA) 108 (90 BASE) MCG/ACT inhaler   Inhalation   Inhale 2 puffs into the lungs daily as needed. For shortness of breath         .  albuterol (PROVENTIL) (2.5 MG/3ML) 0.083% nebulizer solution   Nebulization   Take 2.5 mg by nebulization every 6 (six) hours as needed. For shortness of breath          . amitriptyline (ELAVIL) 50 MG tablet   Oral   Take 50 mg by mouth at bedtime.         Marland Kitchen amLODipine (NORVASC) 10 MG tablet   Oral   Take 10 mg by mouth every morning.          . clonazePAM  (KLONOPIN) 0.5 MG tablet   Oral   Take 0.5 mg by mouth 3 (three) times daily as needed. For anxiety         . docusate sodium (COLACE) 100 MG capsule   Oral   Take 100 mg by mouth at bedtime.          . ferrous sulfate 325 (65 FE) MG tablet   Oral   Take 1 tablet (325 mg total) by mouth 2 (two) times daily.   60 tablet   3   . gabapentin (NEURONTIN) 300 MG capsule   Oral   Take 600 mg by mouth 3 (three) times daily.          Marland Kitchen lubiprostone (AMITIZA) 24 MCG capsule   Oral   Take 1 capsule (24 mcg total) by mouth 2 (two) times daily with a meal.   60 capsule   3   . Pancrelipase, Lip-Prot-Amyl, (ZENPEP) 20000 UNITS CPEP   Oral   Take 3 capsules (60,000 Units total) by mouth 3 (three) times daily with meals. 2 with snacks   400 capsule   3   . pantoprazole (PROTONIX) 40 MG tablet   Oral   Take 1 tablet (40 mg total) by mouth 2 (two) times daily.   60 tablet   3   . promethazine (PHENERGAN) 25 MG tablet   Oral   Take 1 tablet (25 mg total) by mouth every 6 (six) hours as needed for nausea.   30 tablet   0   . hydrOXYzine (ATARAX/VISTARIL) 25 MG tablet   Oral   Take 1 tablet (25 mg total) by mouth every 6 (six) hours as needed for anxiety.   15 tablet   0    BP 125/99  Pulse 122  Temp(Src) 98.2 F (36.8 C) (Oral)  Resp 18  Ht 5\' 2"  (1.575 m)  Wt 156 lb (70.761 kg)  BMI 28.53 kg/m2  SpO2 100% Physical Exam  Nursing note and vitals reviewed. Constitutional: She is oriented to person, place, and time. She appears well-developed and well-nourished.  Appears mildly anxious on exam.   HENT:  Head: Normocephalic.  Mouth/Throat: No oropharyngeal exudate.  Eyes: Conjunctivae and EOM are normal. Pupils are equal, round, and reactive to light.  Neck: Normal range of motion. Neck supple.  Cardiovascular: Normal rate, regular rhythm, normal heart sounds and intact distal pulses.  Exam reveals no gallop and no friction rub.   No murmur heard. Pulmonary/Chest:  Effort normal and breath sounds normal. No respiratory distress. She has no wheezes.  Abdominal: Soft. Bowel sounds are normal. There is no tenderness. There is no rebound and no guarding.  Musculoskeletal: Normal range of motion. She exhibits no edema and no tenderness.  Bruising w/ mild ttp of dorsum of left foot, and left anterior shin.   Neurological: She is alert and oriented to person, place, and time.  Skin: Skin is warm and dry.  Psychiatric: She has a normal  mood and affect. Her behavior is normal.    ED Course   Procedures (including critical care time)  Labs Reviewed  CBC WITH DIFFERENTIAL - Abnormal; Notable for the following:    RDW 16.5 (*)    Neutrophils Relative % 78 (*)    Neutro Abs 8.2 (*)    All other components within normal limits  COMPREHENSIVE METABOLIC PANEL - Abnormal; Notable for the following:    Potassium 3.3 (*)    Glucose, Bld 101 (*)    All other components within normal limits  LIPASE, BLOOD  POCT PREGNANCY, URINE   Dg Tibia/fibula Left  02/27/2013   *RADIOLOGY REPORT*  Clinical Data: Fall with left lower leg pain  LEFT TIBIA AND FIBULA - 2 VIEW  Comparison: None  Findings: No evidence of acute fracture, subluxation or dislocation identified.  No radio-opaque foreign bodies are present.  No focal bony lesions are noted.  The joint spaces are unremarkable.  IMPRESSION: No acute bony abnormality.   Original Report Authenticated By: Harmon Pier, M.D.   Dg Ankle Complete Left  02/27/2013   *RADIOLOGY REPORT*  Clinical Data: 42 year old female with fall and left ankle pain and swelling.  LEFT ANKLE COMPLETE - 3+ VIEW  Comparison: None  Findings: No evidence of acute fracture, subluxation or dislocation identified.  No joint effusion noted.  No radio-opaque foreign bodies are present.  No focal bony lesions are noted.  The joint spaces are unremarkable.  IMPRESSION: No evidence of acute or suspicious abnormality.   Original Report Authenticated By: Harmon Pier,  M.D.   Dg Foot 2 Views Left  02/27/2013   *RADIOLOGY REPORT*  Clinical Data: Left foot pain.  LEFT FOOT - 2 VIEW  Comparison: None  Findings: There is no evidence of acute fracture, subluxation, or dislocation. The Lisfranc joints are intact. No focal bony lesions are identified. There is no evidence of radiopaque foreign body.  The joint spaces are unremarkable.  IMPRESSION: Unremarkable left foot.   Original Report Authenticated By: Harmon Pier, M.D.   1. Anxiety   2. Abdominal cramping      Date: 02/27/2013  Rate: 96  Rhythm: normal sinus rhythm  QRS Axis: borderline RAD  Intervals: mildly prolonged QT  ST/T Wave abnormalities: normal  Conduction Disutrbances:right bundle branch block  Narrative Interpretation: No ST or T wave changes cw ischemia, QRS duration wnl  Old EKG Reviewed: no significant change   MDM  7:51 PM 42 y.o. female pw c/o feeling as if she will jump out of her skin. Pt switched from zoloft to elavil several weeks ago. Feels very anxious. Mild non-focal abd cramping this morning w/ diarrhea. Fell 1 week ago w/ bruising to lower leg. Will get screening labs, imaging, ecg as pt on TCA. Will give small dose ativan for anxiety.   7:51 PM: I interpreted/reviewed the labs and/or imaging. Will give po potassium for mildly low potassium. I have discussed the diagnosis/risks/treatment options with the patient and family and believe the pt to be eligible for discharge home to follow-up with pcp tomorrow to discuss possible taper off elavil. We also discussed returning to the ED immediately if new or worsening sx occur. We discussed the sx which are most concerning (e.g., worsening anxiety or abd pain, fever) that necessitate immediate return. Any new prescriptions provided to the patient are listed below.  Discharge Medication List as of 02/27/2013  2:33 PM    START taking these medications   Details  hydrOXYzine (ATARAX/VISTARIL) 25 MG tablet Take  1 tablet (25 mg total) by mouth  every 6 (six) hours as needed for anxiety., Starting 02/27/2013, Until Discontinued, Print          Junius Argyle, MD 02/27/13 [phone]

## 2013-02-27 NOTE — ED Notes (Signed)
Pt states she feels "jittery and anxious" x1 week. Pt recently had her anti-depressant medication changed and reports symptoms began shortly after. Pt states "I don't think the medications are working". Pt also reports lack of appetite and difficulty sleeping at times.

## 2013-02-27 NOTE — ED Notes (Signed)
The patient states she feels like she needs to talk to a psychiatrist.

## 2013-02-27 NOTE — ED Notes (Signed)
States that her doctor changed her antidepressant and she is out of pain medications.  States that they changed her Lexapro to Zoloft to Elavil.  States that she feels very anxious.  Denies SI/HI.  The patient is very tearful and states "I don't know what I need."

## 2013-02-28 DIAGNOSIS — F411 Generalized anxiety disorder: Secondary | ICD-10-CM | POA: Diagnosis not present

## 2013-02-28 DIAGNOSIS — R1013 Epigastric pain: Secondary | ICD-10-CM | POA: Diagnosis not present

## 2013-02-28 DIAGNOSIS — G8929 Other chronic pain: Secondary | ICD-10-CM | POA: Diagnosis not present

## 2013-02-28 DIAGNOSIS — Z6828 Body mass index (BMI) 28.0-28.9, adult: Secondary | ICD-10-CM | POA: Diagnosis not present

## 2013-03-09 DIAGNOSIS — R109 Unspecified abdominal pain: Secondary | ICD-10-CM | POA: Diagnosis not present

## 2013-03-09 DIAGNOSIS — K859 Acute pancreatitis without necrosis or infection, unspecified: Secondary | ICD-10-CM | POA: Diagnosis not present

## 2013-03-14 DIAGNOSIS — J9819 Other pulmonary collapse: Secondary | ICD-10-CM | POA: Diagnosis not present

## 2013-03-14 DIAGNOSIS — R0902 Hypoxemia: Secondary | ICD-10-CM | POA: Diagnosis not present

## 2013-03-14 DIAGNOSIS — F411 Generalized anxiety disorder: Secondary | ICD-10-CM | POA: Diagnosis present

## 2013-03-14 DIAGNOSIS — J9620 Acute and chronic respiratory failure, unspecified whether with hypoxia or hypercapnia: Secondary | ICD-10-CM | POA: Diagnosis present

## 2013-03-14 DIAGNOSIS — R109 Unspecified abdominal pain: Secondary | ICD-10-CM | POA: Diagnosis not present

## 2013-03-14 DIAGNOSIS — I10 Essential (primary) hypertension: Secondary | ICD-10-CM | POA: Diagnosis present

## 2013-03-14 DIAGNOSIS — J45901 Unspecified asthma with (acute) exacerbation: Secondary | ICD-10-CM | POA: Diagnosis not present

## 2013-03-14 DIAGNOSIS — G894 Chronic pain syndrome: Secondary | ICD-10-CM | POA: Diagnosis present

## 2013-03-14 DIAGNOSIS — K219 Gastro-esophageal reflux disease without esophagitis: Secondary | ICD-10-CM | POA: Diagnosis present

## 2013-03-14 DIAGNOSIS — J441 Chronic obstructive pulmonary disease with (acute) exacerbation: Secondary | ICD-10-CM | POA: Diagnosis present

## 2013-03-14 DIAGNOSIS — Z880 Allergy status to penicillin: Secondary | ICD-10-CM | POA: Diagnosis not present

## 2013-03-14 DIAGNOSIS — J189 Pneumonia, unspecified organism: Secondary | ICD-10-CM | POA: Diagnosis not present

## 2013-03-14 DIAGNOSIS — Z882 Allergy status to sulfonamides status: Secondary | ICD-10-CM | POA: Diagnosis not present

## 2013-03-14 DIAGNOSIS — J45909 Unspecified asthma, uncomplicated: Secondary | ICD-10-CM | POA: Diagnosis not present

## 2013-03-14 DIAGNOSIS — F17200 Nicotine dependence, unspecified, uncomplicated: Secondary | ICD-10-CM | POA: Diagnosis present

## 2013-03-14 DIAGNOSIS — Z79899 Other long term (current) drug therapy: Secondary | ICD-10-CM | POA: Diagnosis not present

## 2013-03-14 DIAGNOSIS — F329 Major depressive disorder, single episode, unspecified: Secondary | ICD-10-CM | POA: Diagnosis present

## 2013-03-16 DIAGNOSIS — J189 Pneumonia, unspecified organism: Secondary | ICD-10-CM | POA: Diagnosis not present

## 2013-03-16 DIAGNOSIS — J45901 Unspecified asthma with (acute) exacerbation: Secondary | ICD-10-CM | POA: Diagnosis not present

## 2013-03-29 DIAGNOSIS — R609 Edema, unspecified: Secondary | ICD-10-CM | POA: Diagnosis not present

## 2013-03-29 DIAGNOSIS — G8929 Other chronic pain: Secondary | ICD-10-CM | POA: Diagnosis not present

## 2013-03-29 DIAGNOSIS — F411 Generalized anxiety disorder: Secondary | ICD-10-CM | POA: Diagnosis not present

## 2013-03-29 DIAGNOSIS — Z6831 Body mass index (BMI) 31.0-31.9, adult: Secondary | ICD-10-CM | POA: Diagnosis not present

## 2013-03-29 DIAGNOSIS — K861 Other chronic pancreatitis: Secondary | ICD-10-CM | POA: Diagnosis not present

## 2013-04-05 DIAGNOSIS — R945 Abnormal results of liver function studies: Secondary | ICD-10-CM | POA: Diagnosis not present

## 2013-04-11 DIAGNOSIS — R5381 Other malaise: Secondary | ICD-10-CM | POA: Diagnosis not present

## 2013-04-11 DIAGNOSIS — F329 Major depressive disorder, single episode, unspecified: Secondary | ICD-10-CM | POA: Diagnosis not present

## 2013-04-11 DIAGNOSIS — E559 Vitamin D deficiency, unspecified: Secondary | ICD-10-CM | POA: Diagnosis not present

## 2013-04-11 DIAGNOSIS — I10 Essential (primary) hypertension: Secondary | ICD-10-CM | POA: Diagnosis not present

## 2013-04-11 DIAGNOSIS — F3289 Other specified depressive episodes: Secondary | ICD-10-CM | POA: Diagnosis not present

## 2013-04-16 ENCOUNTER — Emergency Department (HOSPITAL_COMMUNITY)
Admission: EM | Admit: 2013-04-16 | Discharge: 2013-04-17 | Disposition: A | Discharge status: Home / Self Care | Payer: Medicare Other | Attending: Emergency Medicine | Admitting: Emergency Medicine

## 2013-04-16 ENCOUNTER — Encounter (HOSPITAL_COMMUNITY): Payer: Self-pay | Admitting: *Deleted

## 2013-04-16 DIAGNOSIS — E785 Hyperlipidemia, unspecified: Secondary | ICD-10-CM | POA: Diagnosis not present

## 2013-04-16 DIAGNOSIS — Z880 Allergy status to penicillin: Secondary | ICD-10-CM | POA: Diagnosis not present

## 2013-04-16 DIAGNOSIS — R079 Chest pain, unspecified: Secondary | ICD-10-CM | POA: Insufficient documentation

## 2013-04-16 DIAGNOSIS — G43909 Migraine, unspecified, not intractable, without status migrainosus: Secondary | ICD-10-CM | POA: Diagnosis not present

## 2013-04-16 DIAGNOSIS — K861 Other chronic pancreatitis: Secondary | ICD-10-CM | POA: Insufficient documentation

## 2013-04-16 DIAGNOSIS — E119 Type 2 diabetes mellitus without complications: Secondary | ICD-10-CM | POA: Insufficient documentation

## 2013-04-16 DIAGNOSIS — R109 Unspecified abdominal pain: Secondary | ICD-10-CM

## 2013-04-16 DIAGNOSIS — F411 Generalized anxiety disorder: Secondary | ICD-10-CM | POA: Diagnosis not present

## 2013-04-16 DIAGNOSIS — R1084 Generalized abdominal pain: Secondary | ICD-10-CM | POA: Diagnosis not present

## 2013-04-16 DIAGNOSIS — G8929 Other chronic pain: Secondary | ICD-10-CM | POA: Diagnosis not present

## 2013-04-16 DIAGNOSIS — F17200 Nicotine dependence, unspecified, uncomplicated: Secondary | ICD-10-CM | POA: Insufficient documentation

## 2013-04-16 DIAGNOSIS — M79609 Pain in unspecified limb: Secondary | ICD-10-CM | POA: Diagnosis not present

## 2013-04-16 DIAGNOSIS — J4489 Other specified chronic obstructive pulmonary disease: Secondary | ICD-10-CM | POA: Insufficient documentation

## 2013-04-16 DIAGNOSIS — F329 Major depressive disorder, single episode, unspecified: Secondary | ICD-10-CM | POA: Insufficient documentation

## 2013-04-16 DIAGNOSIS — J449 Chronic obstructive pulmonary disease, unspecified: Secondary | ICD-10-CM | POA: Diagnosis not present

## 2013-04-16 DIAGNOSIS — F3289 Other specified depressive episodes: Secondary | ICD-10-CM | POA: Insufficient documentation

## 2013-04-16 LAB — CBC WITH DIFFERENTIAL/PLATELET
Basophils Absolute: 0 10*3/uL (ref 0.0–0.1)
Basophils Relative: 0 % (ref 0–1)
Eosinophils Absolute: 0.3 10*3/uL (ref 0.0–0.7)
HCT: 42.5 % (ref 36.0–46.0)
Lymphocytes Relative: 31 % (ref 12–46)
MCH: 30.5 pg (ref 26.0–34.0)
MCHC: 33.9 g/dL (ref 30.0–36.0)
Monocytes Absolute: 0.5 10*3/uL (ref 0.1–1.0)
Neutro Abs: 4.1 10*3/uL (ref 1.7–7.7)
Neutrophils Relative %: 58 % (ref 43–77)
Platelets: 250 10*3/uL (ref 150–400)
RDW: 12 % (ref 11.5–15.5)

## 2013-04-16 LAB — URINALYSIS, ROUTINE W REFLEX MICROSCOPIC
Leukocytes, UA: NEGATIVE
Protein, ur: NEGATIVE mg/dL
Urobilinogen, UA: 0.2 mg/dL (ref 0.0–1.0)

## 2013-04-16 MED ORDER — METOCLOPRAMIDE HCL 5 MG/ML IJ SOLN
10.0000 mg | Freq: Once | INTRAMUSCULAR | Status: DC
  Filled 2013-04-16: qty 2

## 2013-04-16 MED ORDER — HYDROMORPHONE HCL PF 2 MG/ML IJ SOLN
2.0000 mg | Freq: Once | INTRAMUSCULAR | Status: AC
  Administered 2013-04-16: 2 mg via INTRAMUSCULAR
  Filled 2013-04-16: qty 1

## 2013-04-16 MED ORDER — SODIUM CHLORIDE 0.9 % IV BOLUS (SEPSIS): 2000.0000 mL | Freq: Once | INTRAVENOUS | Status: DC

## 2013-04-16 MED ORDER — METOCLOPRAMIDE HCL 5 MG/ML IJ SOLN
10.0000 mg | Freq: Once | INTRAMUSCULAR | Status: AC
  Administered 2013-04-16: 10 mg via INTRAMUSCULAR

## 2013-04-16 MED ORDER — HYDROMORPHONE HCL PF 1 MG/ML IJ SOLN
1.0000 mg | Freq: Once | INTRAMUSCULAR | Status: DC
  Filled 2013-04-16: qty 1

## 2013-04-16 MED ORDER — FAMOTIDINE IN NACL 20-0.9 MG/50ML-% IV SOLN
20.0000 mg | Freq: Once | INTRAVENOUS | Status: DC
  Filled 2013-04-16: qty 50

## 2013-04-16 NOTE — ED Notes (Signed)
Pt has multiple complaints. 1. Abdominal pain, upper and left llq 2. Left leg pain 3. Chest pain.

## 2013-04-16 NOTE — ED Notes (Signed)
P 

## 2013-04-16 NOTE — ED Notes (Signed)
Multiple attempts at starting an iv with no success. EDP aware and orders for IM medication were given.

## 2013-04-16 NOTE — ED Provider Notes (Signed)
CSN: [id number]     Arrival date & time 04/16/13  2024 History  This chart was scribed for Hurman Horn, MD by Ardelia Mems, ED Scribe. This patient was seen in room APA14/APA14 and the patient's care was started at 10:42 PM.  Chief Complaint  Patient presents with  . Abdominal Pain  . Leg Pain  . Chest Pain    The history is provided by the patient. No language interpreter was used.    HPI Comments: [name] is a 42 y.o. female with a history of chronic pancreatitis, chronic severe abdominal pain, anemia and chronic back pain who presents to the Emergency Department complaining of acutely worsened abdominal pain that radiates to her left thigh, back and chest 24/7 over the past week. She states that she regularly has intermittent flare-ups of this pain for a few years. She reports associated chills, as well as associated nausea and multiple episodes of emesis per day for the past few days. She states that she has been eating and drinking less than usual the past few days, due to exacerbation of pain with eating and drinking. She states that she has had multiple CT scans of her abdomen in the past. She has a hx of hysterectomy and cyst removal from an ovary. She denies fever, SOB, confusion, vomiting, bloody stools, constipation, diarrhea, rash or any other symptoms. She is a current every day smoker of 1 pack/day of 15 years and denies alcohol use.  PCP- Dr. Elfredia Nevins  Past Medical History  Diagnosis Date  . Asthma   . Depression   . Migraine   . Pancreatitis chronic Idiopathic    Treated by Dr. Margaretha Glassing at [facility] in the past with celiac blocks.  . Bipolar 1 disorder   . Hyperlipidemia 02/03/2012  . Diabetes mellitus   . COPD (chronic obstructive pulmonary disease)   . Neck pain 06/08/2012   Past Surgical History  Procedure Laterality Date  . Cholecystectomy    . Tonsillectomy    . Abdominal hysterectomy    . Esophagogastroduodenoscopy  02/20/2008   NWG:[id number] distal esophageal mucosa, suspicious for neoplasm/Hiatal hernia otherwise normal   . Esophagogastroduodenoscopy  04/03/2008    VHQ:[id number] hiatal hernia, otherwise normal/Short, tight, benign-appearing peptic stricture  . Ileocolonoscopy  06/05/2008      GMW:NUUVOZD anal canal, otherwise normal rectum, colon  . Esophagogastroduodenoscopy  November 16, 2012    Dr. Teena Dunk: hiatal hernia, esophagitis,   . Givens capsule study N/A 12/05/2012    Few superficial erosions but nothing found to explain iron deficiency anemia.   . Colonoscopy N/A 12/05/2012    GUY:QIHKVQ rectum, colon and terminal ileum   Family History  Problem Relation Age of Onset  . Asthma Other   . Coronary artery disease Neg Hx   . Colon cancer Neg Hx    History  Substance Use Topics  . Smoking status: Current Every Day Smoker -- 1.00 packs/day for 15 years    Types: Cigarettes  . Smokeless tobacco: Former Neurosurgeon  . Alcohol Use: No   OB History   Grav Para Term Preterm Abortions TAB SAB Ect Mult Living                 Review of Systems 10 Systems reviewed and are negative for acute change except as noted in the HPI.  Allergies  Penicillins; Sulfa antibiotics; and Prednisone  Home Medications   Current Outpatient Rx  Name  Route  Sig  Dispense  Refill  .  albuterol (PROVENTIL HFA;VENTOLIN HFA) 108 (90 BASE) MCG/ACT inhaler   Inhalation   Inhale 2 puffs into the lungs daily as needed. For shortness of breath         . amitriptyline (ELAVIL) 50 MG tablet   Oral   Take 50 mg by mouth at bedtime.         Marland Kitchen amLODipine (NORVASC) 10 MG tablet   Oral   Take 10 mg by mouth at bedtime.          . dicyclomine (BENTYL) 10 MG capsule   Oral   Take 10 mg by mouth 3 (three) times daily as needed. Take 1 capsule (10 mg total) by mouth 3 (three) times daily as needed (abdominal cramps).         . docusate sodium (COLACE) 100 MG capsule   Oral   Take 100 mg by mouth at bedtime.          .  ferrous sulfate 325 (65 FE) MG tablet   Oral   Take 325 mg by mouth daily.         Marland Kitchen gabapentin (NEURONTIN) 300 MG capsule   Oral   Take 600 mg by mouth 3 (three) times daily.          Marland Kitchen HYDROcodone-acetaminophen (NORCO) 10-325 MG per tablet   Oral   Take 1 tablet by mouth every 6 (six) hours as needed for pain.         . hydrOXYzine (ATARAX/VISTARIL) 25 MG tablet   Oral   Take 1 tablet (25 mg total) by mouth every 6 (six) hours as needed for anxiety.   15 tablet   0   . ipratropium-albuterol (DUONEB) 0.5-2.5 (3) MG/3ML SOLN   Inhalation   Inhale 3 mLs into the lungs every 6 (six) hours as needed. Take 3 mLs by nebulization every 6 (six) hours as needed for Wheezing.         Marland Kitchen LORazepam (ATIVAN) 0.5 MG tablet   Oral   Take 0.5 mg by mouth every 8 (eight) hours.         Marland Kitchen lubiprostone (AMITIZA) 24 MCG capsule   Oral   Take 1 capsule (24 mcg total) by mouth 2 (two) times daily with a meal.   60 capsule   3   . Pancrelipase, Lip-Prot-Amyl, (ZENPEP) 20000 UNITS CPEP   Oral   Take 3 capsules (60,000 Units total) by mouth 3 (three) times daily with meals. 2 with snacks   400 capsule   3   . pantoprazole (PROTONIX) 40 MG tablet   Oral   Take 1 tablet (40 mg total) by mouth 2 (two) times daily.   60 tablet   3   . polyethylene glycol powder (GLYCOLAX/MIRALAX) powder   Oral   Take 17 g by mouth daily as needed. for Constipation.         . promethazine (PHENERGAN) 25 MG tablet   Oral   Take 1 tablet (25 mg total) by mouth every 6 (six) hours as needed for nausea.   30 tablet   0   . ondansetron (ZOFRAN) 4 MG tablet   Oral   Take 4 mg by mouth every 8 (eight) hours as needed for nausea.           Triage Vitals: BP 129/89  Pulse 100  Temp(Src) 98.4 F (36.9 C) (Oral)  Resp 20  Ht 5\' 2"  (1.575 m)  Wt 163 lb (73.936 kg)  BMI 29.81 kg/m2  SpO2  97%  Physical Exam  Nursing note and vitals reviewed. Constitutional:  Awake, alert, nontoxic  appearance.  HENT:  Head: Atraumatic.  Eyes: Right eye exhibits no discharge. Left eye exhibits no discharge.  Neck: Neck supple.  Cardiovascular: Normal rate, regular rhythm and normal heart sounds.   No murmur heard. Pulmonary/Chest: Effort normal and breath sounds normal. No respiratory distress. She has no wheezes. She exhibits no tenderness.  Abdominal: Soft. Bowel sounds are normal. She exhibits no distension and no mass. There is tenderness (Diffuse tenderness). There is no rebound and no guarding.  Mild diffuse tenderness  Genitourinary:  No CVA tenderness.  Musculoskeletal: She exhibits no tenderness.  Baseline ROM, no obvious new focal weakness. Difffuse lumbar and paralumbar tenderness.  Neurological: She is alert.  Mental status and motor strength appears baseline for patient and situation.  Skin: No rash noted.  Psychiatric: She has a normal mood and affect.    ED Course  Procedures (including critical care time) ECG: Sinus rhythm, ventricular rate 92, normal axis, right bundle branch block, no acute ischemic changes noted, no significant change noted compared with August 2014 DIAGNOSTIC STUDIES: Oxygen Saturation is 97% on RA, normal by my interpretation.    COORDINATION OF CARE: 10:49 PM- Pt advised of plan for treatment, including plan to receive fluids, nausea medication and pain medication. Pt also advised of plan for diagnostic lab work. Pt agrees with plan for treatment. Pt feels improved after observation and/or treatment in ED and feels okay for discharge.Patient / Family / Caregiver informed of clinical course, understand medical decision-making process, and agree with plan. Labs Review Labs Reviewed  COMPREHENSIVE METABOLIC PANEL - Abnormal; Notable for the following:    Glucose, Bld 144 (*)    All other components within normal limits  URINALYSIS, ROUTINE W REFLEX MICROSCOPIC  TROPONIN I  CBC WITH DIFFERENTIAL  LIPASE, BLOOD   Imaging Review No results  found.  MDM   1. Chronic abdominal pain   2. Abdominal pain    I doubt any other EMC precluding discharge at this time including, but not necessarily limited to the following:SBI, peritonitis. I personally performed the services described in this documentation, which was scribed in my presence. The recorded information has been reviewed and is accurate.    Hurman Horn, MD 04/17/13 2214

## 2013-04-16 NOTE — ED Notes (Signed)
Pt c/o upper abdominal pain, left side pelvic pain, lower back pain, left leg pain, as well as chest pain which describes as burning. Symptoms began Friday. Pt also reports N/V. Pt states she has hx of pancreatitis and she thinks that is what's causing her current symptoms.

## 2013-04-17 ENCOUNTER — Other Ambulatory Visit: Payer: Self-pay

## 2013-04-17 DIAGNOSIS — R197 Diarrhea, unspecified: Secondary | ICD-10-CM

## 2013-04-17 DIAGNOSIS — D509 Iron deficiency anemia, unspecified: Secondary | ICD-10-CM

## 2013-04-17 DIAGNOSIS — R109 Unspecified abdominal pain: Secondary | ICD-10-CM

## 2013-04-17 DIAGNOSIS — D62 Acute posthemorrhagic anemia: Secondary | ICD-10-CM

## 2013-04-17 LAB — COMPREHENSIVE METABOLIC PANEL
AST: 20 U/L (ref 0–37)
Albumin: 4.2 g/dL (ref 3.5–5.2)
Alkaline Phosphatase: 83 U/L (ref 39–117)
CO2: 26 mEq/L (ref 19–32)
Calcium: 9.8 mg/dL (ref 8.4–10.5)
Chloride: 100 mEq/L (ref 96–112)
Creatinine, Ser: 0.7 mg/dL (ref 0.50–1.10)
GFR calc Af Amer: >90 mL/min (ref >90)
GFR calc non Af Amer: >90 mL/min (ref >90)
Potassium: 3.6 mEq/L (ref 3.5–5.1)
Total Bilirubin: 0.4 mg/dL (ref 0.3–1.2)
Total Protein: 7.4 g/dL (ref 6.0–8.3)

## 2013-04-17 LAB — TROPONIN I: Troponin I: <0.3 ng/mL (ref <0.30)

## 2013-04-17 LAB — LIPASE, BLOOD: Lipase: 15 U/L (ref 11–59)

## 2013-04-17 NOTE — ED Notes (Addendum)
Pt given water to drink. Alfred NT gave pt a warm blanket and placed pt on monitor.

## 2013-04-21 DIAGNOSIS — F17200 Nicotine dependence, unspecified, uncomplicated: Secondary | ICD-10-CM | POA: Diagnosis not present

## 2013-04-21 DIAGNOSIS — K219 Gastro-esophageal reflux disease without esophagitis: Secondary | ICD-10-CM | POA: Diagnosis not present

## 2013-04-21 DIAGNOSIS — I10 Essential (primary) hypertension: Secondary | ICD-10-CM | POA: Diagnosis not present

## 2013-04-21 DIAGNOSIS — J449 Chronic obstructive pulmonary disease, unspecified: Secondary | ICD-10-CM | POA: Diagnosis not present

## 2013-04-21 DIAGNOSIS — K861 Other chronic pancreatitis: Secondary | ICD-10-CM | POA: Diagnosis not present

## 2013-04-21 DIAGNOSIS — G8929 Other chronic pain: Secondary | ICD-10-CM | POA: Diagnosis not present

## 2013-04-21 DIAGNOSIS — E119 Type 2 diabetes mellitus without complications: Secondary | ICD-10-CM | POA: Diagnosis not present

## 2013-04-25 ENCOUNTER — Encounter: Payer: Self-pay | Admitting: Internal Medicine

## 2013-04-26 DIAGNOSIS — Z23 Encounter for immunization: Secondary | ICD-10-CM | POA: Diagnosis not present

## 2013-04-26 DIAGNOSIS — Z6828 Body mass index (BMI) 28.0-28.9, adult: Secondary | ICD-10-CM | POA: Diagnosis not present

## 2013-04-26 DIAGNOSIS — K769 Liver disease, unspecified: Secondary | ICD-10-CM | POA: Diagnosis not present

## 2013-04-26 DIAGNOSIS — E119 Type 2 diabetes mellitus without complications: Secondary | ICD-10-CM | POA: Diagnosis not present

## 2013-05-02 DIAGNOSIS — G8929 Other chronic pain: Secondary | ICD-10-CM | POA: Diagnosis not present

## 2013-05-02 DIAGNOSIS — Z6827 Body mass index (BMI) 27.0-27.9, adult: Secondary | ICD-10-CM | POA: Diagnosis not present

## 2013-05-02 DIAGNOSIS — J45909 Unspecified asthma, uncomplicated: Secondary | ICD-10-CM | POA: Diagnosis not present

## 2013-05-10 ENCOUNTER — Other Ambulatory Visit: Payer: Self-pay | Admitting: Gastroenterology

## 2013-05-18 DIAGNOSIS — M545 Low back pain: Secondary | ICD-10-CM | POA: Diagnosis not present

## 2013-05-18 DIAGNOSIS — IMO0002 Reserved for concepts with insufficient information to code with codable children: Secondary | ICD-10-CM | POA: Diagnosis not present

## 2013-05-22 DIAGNOSIS — IMO0002 Reserved for concepts with insufficient information to code with codable children: Secondary | ICD-10-CM | POA: Diagnosis not present

## 2013-05-22 DIAGNOSIS — M545 Low back pain: Secondary | ICD-10-CM | POA: Diagnosis not present

## 2013-05-25 ENCOUNTER — Other Ambulatory Visit: Payer: Self-pay | Admitting: Gastroenterology

## 2013-05-29 DIAGNOSIS — K769 Liver disease, unspecified: Secondary | ICD-10-CM | POA: Diagnosis not present

## 2013-05-29 DIAGNOSIS — G8929 Other chronic pain: Secondary | ICD-10-CM | POA: Diagnosis not present

## 2013-05-29 DIAGNOSIS — K861 Other chronic pancreatitis: Secondary | ICD-10-CM | POA: Diagnosis not present

## 2013-05-29 DIAGNOSIS — Z6828 Body mass index (BMI) 28.0-28.9, adult: Secondary | ICD-10-CM | POA: Diagnosis not present

## 2013-05-31 DIAGNOSIS — M545 Low back pain: Secondary | ICD-10-CM | POA: Diagnosis not present

## 2013-05-31 DIAGNOSIS — IMO0002 Reserved for concepts with insufficient information to code with codable children: Secondary | ICD-10-CM | POA: Diagnosis not present

## 2013-06-01 ENCOUNTER — Other Ambulatory Visit: Payer: Self-pay

## 2013-06-01 DIAGNOSIS — IMO0002 Reserved for concepts with insufficient information to code with codable children: Secondary | ICD-10-CM | POA: Diagnosis not present

## 2013-06-01 DIAGNOSIS — M545 Low back pain: Secondary | ICD-10-CM | POA: Diagnosis not present

## 2013-06-08 DIAGNOSIS — M545 Low back pain: Secondary | ICD-10-CM | POA: Diagnosis not present

## 2013-06-08 DIAGNOSIS — IMO0002 Reserved for concepts with insufficient information to code with codable children: Secondary | ICD-10-CM | POA: Diagnosis not present

## 2013-06-12 DIAGNOSIS — G894 Chronic pain syndrome: Secondary | ICD-10-CM | POA: Diagnosis not present

## 2013-06-13 DIAGNOSIS — M545 Low back pain: Secondary | ICD-10-CM | POA: Diagnosis not present

## 2013-06-13 DIAGNOSIS — IMO0002 Reserved for concepts with insufficient information to code with codable children: Secondary | ICD-10-CM | POA: Diagnosis not present

## 2013-06-15 DIAGNOSIS — IMO0002 Reserved for concepts with insufficient information to code with codable children: Secondary | ICD-10-CM | POA: Diagnosis not present

## 2013-06-15 DIAGNOSIS — M5412 Radiculopathy, cervical region: Secondary | ICD-10-CM | POA: Diagnosis not present

## 2013-06-15 DIAGNOSIS — M542 Cervicalgia: Secondary | ICD-10-CM | POA: Diagnosis not present

## 2013-06-15 DIAGNOSIS — M545 Low back pain: Secondary | ICD-10-CM | POA: Diagnosis not present

## 2013-06-20 ENCOUNTER — Emergency Department (HOSPITAL_COMMUNITY)
Admission: EM | Admit: 2013-06-20 | Discharge: 2013-06-20 | Disposition: A | Discharge status: Home / Self Care | Payer: Medicare Other | Attending: Emergency Medicine | Admitting: Emergency Medicine

## 2013-06-20 ENCOUNTER — Emergency Department (HOSPITAL_COMMUNITY): Payer: Medicare Other

## 2013-06-20 ENCOUNTER — Encounter (HOSPITAL_COMMUNITY): Payer: Self-pay | Admitting: Emergency Medicine

## 2013-06-20 DIAGNOSIS — Z87891 Personal history of nicotine dependence: Secondary | ICD-10-CM | POA: Diagnosis not present

## 2013-06-20 DIAGNOSIS — J45901 Unspecified asthma with (acute) exacerbation: Secondary | ICD-10-CM

## 2013-06-20 DIAGNOSIS — J441 Chronic obstructive pulmonary disease with (acute) exacerbation: Secondary | ICD-10-CM | POA: Insufficient documentation

## 2013-06-20 DIAGNOSIS — Z862 Personal history of diseases of the blood and blood-forming organs and certain disorders involving the immune mechanism: Secondary | ICD-10-CM | POA: Insufficient documentation

## 2013-06-20 DIAGNOSIS — R05 Cough: Secondary | ICD-10-CM | POA: Diagnosis not present

## 2013-06-20 DIAGNOSIS — K2100 Gastro-esophageal reflux disease with esophagitis, without bleeding: Secondary | ICD-10-CM | POA: Diagnosis not present

## 2013-06-20 DIAGNOSIS — G43909 Migraine, unspecified, not intractable, without status migrainosus: Secondary | ICD-10-CM | POA: Diagnosis not present

## 2013-06-20 DIAGNOSIS — F319 Bipolar disorder, unspecified: Secondary | ICD-10-CM | POA: Diagnosis not present

## 2013-06-20 DIAGNOSIS — Z880 Allergy status to penicillin: Secondary | ICD-10-CM | POA: Insufficient documentation

## 2013-06-20 DIAGNOSIS — Z79899 Other long term (current) drug therapy: Secondary | ICD-10-CM | POA: Insufficient documentation

## 2013-06-20 DIAGNOSIS — F411 Generalized anxiety disorder: Secondary | ICD-10-CM | POA: Diagnosis not present

## 2013-06-20 DIAGNOSIS — E119 Type 2 diabetes mellitus without complications: Secondary | ICD-10-CM | POA: Insufficient documentation

## 2013-06-20 DIAGNOSIS — G8929 Other chronic pain: Secondary | ICD-10-CM | POA: Insufficient documentation

## 2013-06-20 DIAGNOSIS — Z8639 Personal history of other endocrine, nutritional and metabolic disease: Secondary | ICD-10-CM | POA: Insufficient documentation

## 2013-06-20 DIAGNOSIS — J45909 Unspecified asthma, uncomplicated: Secondary | ICD-10-CM | POA: Diagnosis not present

## 2013-06-20 DIAGNOSIS — R0602 Shortness of breath: Secondary | ICD-10-CM | POA: Diagnosis not present

## 2013-06-20 HISTORY — DX: Anxiety disorder, unspecified: F41.9

## 2013-06-20 HISTORY — DX: Diaphragmatic hernia without obstruction or gangrene: K44.9

## 2013-06-20 HISTORY — DX: Anemia, unspecified: D64.9

## 2013-06-20 HISTORY — DX: Esophagitis, unspecified without bleeding: K20.90

## 2013-06-20 HISTORY — DX: Other chronic pain: G89.29

## 2013-06-20 HISTORY — DX: Unspecified abdominal pain: R10.9

## 2013-06-20 HISTORY — DX: Esophagitis, unspecified: K20.9

## 2013-06-20 MED ORDER — ALBUTEROL SULFATE (5 MG/ML) 0.5% IN NEBU
5.0000 mg | INHALATION_SOLUTION | Freq: Once | RESPIRATORY_TRACT | Status: AC
  Administered 2013-06-20: 5 mg via RESPIRATORY_TRACT
  Filled 2013-06-20: qty 1

## 2013-06-20 MED ORDER — IPRATROPIUM BROMIDE 0.02 % IN SOLN
0.5000 mg | Freq: Once | RESPIRATORY_TRACT | Status: AC
  Administered 2013-06-20: 0.5 mg via RESPIRATORY_TRACT
  Filled 2013-06-20: qty 2.5

## 2013-06-20 MED ORDER — ALBUTEROL SULFATE HFA 108 (90 BASE) MCG/ACT IN AERS: 2.0000 | INHALATION_SPRAY | RESPIRATORY_TRACT | Status: DC | PRN

## 2013-06-20 MED ORDER — ALBUTEROL SULFATE (2.5 MG/3ML) 0.083% IN NEBU: 2.5000 mg | INHALATION_SOLUTION | RESPIRATORY_TRACT | Status: DC | PRN

## 2013-06-20 MED ORDER — DEXAMETHASONE 4 MG PO TABS
10.0000 mg | ORAL_TABLET | Freq: Once | ORAL | Status: AC
  Administered 2013-06-20: 10 mg via ORAL
  Filled 2013-06-20: qty 2.5

## 2013-06-20 NOTE — ED Notes (Signed)
Sob/prod cough with white phlegm x 1 day. Took 2 breathing tx's pta. Wheezing audible and slight sob noted. NAD

## 2013-06-20 NOTE — ED Notes (Signed)
Faint end exp and insp wheezing bilateral, worse on right side.

## 2013-06-20 NOTE — ED Provider Notes (Signed)
CSN: [id number]     Arrival date & time 06/20/13  7829 History   First MD Initiated Contact with Patient 06/20/13 1018     Chief Complaint  Patient presents with  . Shortness of Breath  . Cough   HPI Pt was seen at 1040.  Per pt, c/o gradual onset and worsening of persistent cough, wheezing and SOB since yesterday.  Describes her symptoms as "my asthma is acting up."  Has been using home MDI and nebs with transient relief. Has been associated with runny/stuffy nose, ears and sinus congestion. Denies CP/palpitations, no back pain, no abd pain, no N/V/D, no fevers, no rash.     Past Medical History  Diagnosis Date  . Asthma   . Depression   . Migraine   . Pancreatitis chronic Idiopathic    Treated by Dr. Margaretha Glassing at [facility] in the past with celiac blocks.  . Bipolar 1 disorder   . Hyperlipidemia 02/03/2012  . Diabetes mellitus   . COPD (chronic obstructive pulmonary disease)   . Neck pain 06/08/2012  . Chronic abdominal pain   . Anemia   . Anxiety   . Hiatal hernia   . Esophagitis    Past Surgical History  Procedure Laterality Date  . Cholecystectomy    . Tonsillectomy    . Abdominal hysterectomy    . Esophagogastroduodenoscopy  02/20/2008    FAO:ZHYQMVHQI distal esophageal mucosa, suspicious for neoplasm/Hiatal hernia otherwise normal   . Esophagogastroduodenoscopy  04/03/2008    ONG:[id number] hiatal hernia, otherwise normal/Short, tight, benign-appearing peptic stricture  . Ileocolonoscopy  06/05/2008      UUV:OZDGUYQ anal canal, otherwise normal rectum, colon  . Esophagogastroduodenoscopy  November 16, 2012    Dr. Teena Dunk: hiatal hernia, esophagitis,   . Givens capsule study N/A 12/05/2012    Few superficial erosions but nothing found to explain iron deficiency anemia.   . Colonoscopy N/A 12/05/2012    IHK:VQQVZD rectum, colon and terminal ileum  . Celiac plexus block     Family History  Problem Relation Age of Onset  . Asthma Other   . Coronary artery  disease Neg Hx   . Colon cancer Neg Hx    History  Substance Use Topics  . Smoking status: Former Smoker -- 1.00 packs/day for 15 years    Types: Cigarettes    Quit date: 06/06/2013  . Smokeless tobacco: Former Neurosurgeon  . Alcohol Use: No    Review of Systems ROS: Statement: All systems negative except as marked or noted in the HPI; Constitutional: Negative for fever and chills. ; ; Eyes: Negative for eye pain, redness and discharge. ; ; ENMT: Negative for ear pain, hoarseness, sore throat. +nasal congestion, ears congestion, sinus pressure. ; ; Cardiovascular: Negative for chest pain, palpitations, diaphoresis, and peripheral edema. ; ; Respiratory: +cough, wheezing, SOB. Negative for stridor. ; ; Gastrointestinal: Negative for nausea, vomiting, diarrhea, abdominal pain, blood in stool, hematemesis, jaundice and rectal bleeding. . ; ; Genitourinary: Negative for dysuria, flank pain and hematuria. ; ; Musculoskeletal: Negative for back pain and neck pain. Negative for swelling and trauma.; ; Skin: Negative for pruritus, rash, abrasions, blisters, bruising and skin lesion.; ; Neuro: Negative for headache, lightheadedness and neck stiffness. Negative for weakness, altered level of consciousness , altered mental status, extremity weakness, paresthesias, involuntary movement, seizure and syncope.       Allergies  Penicillins; Sulfa antibiotics; and Prednisone  Home Medications   Current Outpatient Rx  Name  Route  Sig  Dispense  Refill  . albuterol (PROVENTIL HFA;VENTOLIN HFA) 108 (90 BASE) MCG/ACT inhaler   Inhalation   Inhale 2 puffs into the lungs daily as needed. For shortness of breath         . amitriptyline (ELAVIL) 50 MG tablet   Oral   Take 50 mg by mouth at bedtime.         Marland Kitchen amLODipine (NORVASC) 10 MG tablet   Oral   Take 10 mg by mouth at bedtime.          . dicyclomine (BENTYL) 10 MG capsule   Oral   Take 10 mg by mouth 3 (three) times daily as needed. Take 1 capsule  (10 mg total) by mouth 3 (three) times daily as needed (abdominal cramps).         . ferrous sulfate 325 (65 FE) MG tablet   Oral   Take 325 mg by mouth daily.         Marland Kitchen gabapentin (NEURONTIN) 300 MG capsule   Oral   Take 600 mg by mouth 3 (three) times daily.          Marland Kitchen HYDROcodone-acetaminophen (NORCO) 10-325 MG per tablet   Oral   Take 1 tablet by mouth every 6 (six) hours as needed for pain.         . hydrOXYzine (ATARAX/VISTARIL) 25 MG tablet   Oral   Take 1 tablet (25 mg total) by mouth every 6 (six) hours as needed for anxiety.   15 tablet   0   . ipratropium-albuterol (DUONEB) 0.5-2.5 (3) MG/3ML SOLN   Inhalation   Inhale 3 mLs into the lungs every 6 (six) hours as needed. Take 3 mLs by nebulization every 6 (six) hours as needed for Wheezing.         Marland Kitchen LORazepam (ATIVAN) 0.5 MG tablet   Oral   Take 0.5 mg by mouth every 8 (eight) hours.         . ondansetron (ZOFRAN) 4 MG tablet   Oral   Take 4 mg by mouth every 8 (eight) hours as needed for nausea.          . Pancrelipase, Lip-Prot-Amyl, (ZENPEP) 20000 UNITS CPEP   Oral   Take 3 capsules (60,000 Units total) by mouth 3 (three) times daily with meals. 2 with snacks   400 capsule   3   . pantoprazole (PROTONIX) 40 MG tablet   Oral   Take 40 mg by mouth 2 (two) times daily.         . polyethylene glycol powder (GLYCOLAX/MIRALAX) powder   Oral   Take 17 g by mouth daily as needed. for Constipation.         . promethazine (PHENERGAN) 25 MG tablet   Oral   Take 25 mg by mouth every 6 (six) hours as needed for nausea or vomiting.         . docusate sodium (COLACE) 100 MG capsule   Oral   Take 100 mg by mouth at bedtime.           BP 141/97  Pulse 112  Temp(Src) 98.5 F (36.9 C)  Resp 16  SpO2 94% Physical Exam 1045: Physical examination:  Nursing notes reviewed; Vital signs and O2 SAT reviewed;  Constitutional: Well developed, Well nourished, Well hydrated, In no acute distress;  Head:  Normocephalic, atraumatic; Eyes: EOMI, PERRL, No scleral icterus; ENMT: TM's clear bilat. +edemetous nasal turbinates bilat with clear rhinorrhea. Mouth and pharynx normal, Mucous membranes moist;  Neck: Supple, Full range of motion, No lymphadenopathy; Cardiovascular: Regular rate and rhythm, No gallop; Respiratory: Breath sounds clear & equal bilaterally, No wheezes.  Speaking full sentences with ease, Normal respiratory effort/excursion; Chest: Nontender, Movement normal; Abdomen: Soft, Nontender, Nondistended, Normal bowel sounds; Genitourinary: No CVA tenderness; Extremities: Pulses normal, No tenderness, No edema, No calf edema or asymmetry.; Neuro: AA&Ox3, Major CN grossly intact.  Speech clear. No gross focal motor or sensory deficits in extremities.; Skin: Color normal, Warm, Dry.   ED Course  Procedures   EKG Interpretation   None       MDM  MDM Reviewed: previous chart, nursing note and vitals Interpretation: x-ray   Dg Chest 2 View 06/20/2013   CLINICAL DATA:  Cough, shortness of Breath  EXAM: CHEST  2 VIEW  COMPARISON:  03/16/2013 and 01/26/2013  FINDINGS: Cardiomediastinal silhouette is stable. No acute infiltrate or pleural effusion. Stable mild compression deformity of T12 vertebral body.  IMPRESSION: No active cardiopulmonary disease.   Electronically Signed   By: Natasha Mead M.D.   On: 06/20/2013 11:37      1045:  ED RN gave pt neb before my exam due to insp/exp wheezing on her exam. Pt states she "feels better now," speaking full sentences with ease, lungs CTA bilat without wheezing, Sats 99% R/A. Will dose decadron (pt states she "can't take prednisone" but can take decadron) and check CXR.  1310:  Pt requested another neb "while I'm here." 2nd neb completed. Lungs CTA bilat, no wheezing, resps easy, talking in full sentences with ease, Sats 99% R/A, NAD. Wants to go home now. Dx and testing d/w pt.  Questions answered.  Verb understanding, agreeable to d/c home with  outpt f/u.     Laray Anger, DO 06/21/13 2218

## 2013-06-26 DIAGNOSIS — M545 Low back pain: Secondary | ICD-10-CM | POA: Diagnosis not present

## 2013-06-26 DIAGNOSIS — M5412 Radiculopathy, cervical region: Secondary | ICD-10-CM | POA: Diagnosis not present

## 2013-06-26 DIAGNOSIS — G8929 Other chronic pain: Secondary | ICD-10-CM | POA: Diagnosis not present

## 2013-06-26 DIAGNOSIS — J019 Acute sinusitis, unspecified: Secondary | ICD-10-CM | POA: Diagnosis not present

## 2013-06-26 DIAGNOSIS — M542 Cervicalgia: Secondary | ICD-10-CM | POA: Diagnosis not present

## 2013-06-26 DIAGNOSIS — IMO0002 Reserved for concepts with insufficient information to code with codable children: Secondary | ICD-10-CM | POA: Diagnosis not present

## 2013-06-26 DIAGNOSIS — J209 Acute bronchitis, unspecified: Secondary | ICD-10-CM | POA: Diagnosis not present

## 2013-06-26 DIAGNOSIS — J45909 Unspecified asthma, uncomplicated: Secondary | ICD-10-CM | POA: Diagnosis not present

## 2013-06-26 DIAGNOSIS — Z6828 Body mass index (BMI) 28.0-28.9, adult: Secondary | ICD-10-CM | POA: Diagnosis not present

## 2013-06-29 ENCOUNTER — Encounter (HOSPITAL_COMMUNITY): Payer: Self-pay | Admitting: Emergency Medicine

## 2013-06-29 ENCOUNTER — Inpatient Hospital Stay (HOSPITAL_COMMUNITY)
Admission: EM | Admit: 2013-06-29 | Discharge: 2013-07-01 | DRG: 193 | Disposition: A | Discharge status: Home / Self Care | Payer: Medicare Other | Attending: Internal Medicine | Admitting: Internal Medicine

## 2013-06-29 ENCOUNTER — Emergency Department (HOSPITAL_COMMUNITY): Payer: Medicare Other

## 2013-06-29 DIAGNOSIS — R109 Unspecified abdominal pain: Secondary | ICD-10-CM

## 2013-06-29 DIAGNOSIS — J441 Chronic obstructive pulmonary disease with (acute) exacerbation: Secondary | ICD-10-CM | POA: Diagnosis present

## 2013-06-29 DIAGNOSIS — K449 Diaphragmatic hernia without obstruction or gangrene: Secondary | ICD-10-CM

## 2013-06-29 DIAGNOSIS — Z79899 Other long term (current) drug therapy: Secondary | ICD-10-CM

## 2013-06-29 DIAGNOSIS — K859 Acute pancreatitis without necrosis or infection, unspecified: Secondary | ICD-10-CM

## 2013-06-29 DIAGNOSIS — J440 Chronic obstructive pulmonary disease with acute lower respiratory infection: Secondary | ICD-10-CM | POA: Diagnosis present

## 2013-06-29 DIAGNOSIS — F411 Generalized anxiety disorder: Secondary | ICD-10-CM | POA: Diagnosis present

## 2013-06-29 DIAGNOSIS — K222 Esophageal obstruction: Secondary | ICD-10-CM

## 2013-06-29 DIAGNOSIS — K5900 Constipation, unspecified: Secondary | ICD-10-CM | POA: Diagnosis present

## 2013-06-29 DIAGNOSIS — R202 Paresthesia of skin: Secondary | ICD-10-CM

## 2013-06-29 DIAGNOSIS — D509 Iron deficiency anemia, unspecified: Secondary | ICD-10-CM

## 2013-06-29 DIAGNOSIS — J9600 Acute respiratory failure, unspecified whether with hypoxia or hypercapnia: Secondary | ICD-10-CM | POA: Diagnosis not present

## 2013-06-29 DIAGNOSIS — IMO0001 Reserved for inherently not codable concepts without codable children: Secondary | ICD-10-CM | POA: Diagnosis present

## 2013-06-29 DIAGNOSIS — J209 Acute bronchitis, unspecified: Secondary | ICD-10-CM | POA: Diagnosis not present

## 2013-06-29 DIAGNOSIS — K625 Hemorrhage of anus and rectum: Secondary | ICD-10-CM

## 2013-06-29 DIAGNOSIS — R7309 Other abnormal glucose: Secondary | ICD-10-CM

## 2013-06-29 DIAGNOSIS — G43909 Migraine, unspecified, not intractable, without status migrainosus: Secondary | ICD-10-CM | POA: Diagnosis present

## 2013-06-29 DIAGNOSIS — J189 Pneumonia, unspecified organism: Secondary | ICD-10-CM | POA: Diagnosis not present

## 2013-06-29 DIAGNOSIS — Z823 Family history of stroke: Secondary | ICD-10-CM

## 2013-06-29 DIAGNOSIS — F17200 Nicotine dependence, unspecified, uncomplicated: Secondary | ICD-10-CM | POA: Diagnosis present

## 2013-06-29 DIAGNOSIS — K219 Gastro-esophageal reflux disease without esophagitis: Secondary | ICD-10-CM

## 2013-06-29 DIAGNOSIS — G8929 Other chronic pain: Secondary | ICD-10-CM | POA: Diagnosis present

## 2013-06-29 DIAGNOSIS — R197 Diarrhea, unspecified: Secondary | ICD-10-CM

## 2013-06-29 DIAGNOSIS — T50904A Poisoning by unspecified drugs, medicaments and biological substances, undetermined, initial encounter: Secondary | ICD-10-CM

## 2013-06-29 DIAGNOSIS — Z825 Family history of asthma and other chronic lower respiratory diseases: Secondary | ICD-10-CM

## 2013-06-29 DIAGNOSIS — M542 Cervicalgia: Secondary | ICD-10-CM

## 2013-06-29 DIAGNOSIS — I10 Essential (primary) hypertension: Secondary | ICD-10-CM | POA: Diagnosis present

## 2013-06-29 DIAGNOSIS — K8500 Idiopathic acute pancreatitis without necrosis or infection: Secondary | ICD-10-CM

## 2013-06-29 DIAGNOSIS — K921 Melena: Secondary | ICD-10-CM

## 2013-06-29 DIAGNOSIS — E785 Hyperlipidemia, unspecified: Secondary | ICD-10-CM | POA: Diagnosis present

## 2013-06-29 DIAGNOSIS — R739 Hyperglycemia, unspecified: Secondary | ICD-10-CM

## 2013-06-29 DIAGNOSIS — M549 Dorsalgia, unspecified: Secondary | ICD-10-CM | POA: Diagnosis present

## 2013-06-29 DIAGNOSIS — M62838 Other muscle spasm: Secondary | ICD-10-CM

## 2013-06-29 DIAGNOSIS — K861 Other chronic pancreatitis: Secondary | ICD-10-CM | POA: Diagnosis present

## 2013-06-29 DIAGNOSIS — F419 Anxiety disorder, unspecified: Secondary | ICD-10-CM

## 2013-06-29 DIAGNOSIS — J45901 Unspecified asthma with (acute) exacerbation: Secondary | ICD-10-CM | POA: Diagnosis not present

## 2013-06-29 DIAGNOSIS — J9601 Acute respiratory failure with hypoxia: Secondary | ICD-10-CM

## 2013-06-29 DIAGNOSIS — E876 Hypokalemia: Secondary | ICD-10-CM

## 2013-06-29 DIAGNOSIS — D62 Acute posthemorrhagic anemia: Secondary | ICD-10-CM

## 2013-06-29 DIAGNOSIS — F319 Bipolar disorder, unspecified: Secondary | ICD-10-CM | POA: Diagnosis present

## 2013-06-29 DIAGNOSIS — Z720 Tobacco use: Secondary | ICD-10-CM | POA: Diagnosis present

## 2013-06-29 DIAGNOSIS — E119 Type 2 diabetes mellitus without complications: Secondary | ICD-10-CM | POA: Diagnosis present

## 2013-06-29 DIAGNOSIS — R0602 Shortness of breath: Secondary | ICD-10-CM | POA: Diagnosis not present

## 2013-06-29 DIAGNOSIS — J45909 Unspecified asthma, uncomplicated: Secondary | ICD-10-CM

## 2013-06-29 LAB — COMPREHENSIVE METABOLIC PANEL
ALT: 19 U/L (ref 0–35)
AST: 18 U/L (ref 0–37)
Alkaline Phosphatase: 91 U/L (ref 39–117)
Calcium: 9.6 mg/dL (ref 8.4–10.5)
Creatinine, Ser: 0.6 mg/dL (ref 0.50–1.10)
GFR calc Af Amer: >90 mL/min (ref >90)
Potassium: 3.4 mEq/L — ABNORMAL LOW (ref 3.5–5.1)
Sodium: 133 mEq/L — ABNORMAL LOW (ref 135–145)
Total Protein: 7.3 g/dL (ref 6.0–8.3)

## 2013-06-29 LAB — CBC WITH DIFFERENTIAL/PLATELET
Basophils Absolute: 0.1 10*3/uL (ref 0.0–0.1)
Eosinophils Absolute: 1.1 10*3/uL — ABNORMAL HIGH (ref 0.0–0.7)
Eosinophils Relative: 6 % — ABNORMAL HIGH (ref 0–5)
HCT: 39.9 % (ref 36.0–46.0)
Hemoglobin: 13.8 g/dL (ref 12.0–15.0)
Lymphocytes Relative: 14 % (ref 12–46)
Lymphs Abs: 2.3 10*3/uL (ref 0.7–4.0)
MCH: 30.6 pg (ref 26.0–34.0)
MCV: 88.5 fL (ref 78.0–100.0)
Monocytes Relative: 6 % (ref 3–12)
Neutro Abs: 12.3 10*3/uL — ABNORMAL HIGH (ref 1.7–7.7)
Neutrophils Relative %: 73 % (ref 43–77)
Platelets: 266 10*3/uL (ref 150–400)
RBC: 4.51 MIL/uL (ref 3.87–5.11)
RDW: 12.2 % (ref 11.5–15.5)
WBC: 16.8 10*3/uL — ABNORMAL HIGH (ref 4.0–10.5)

## 2013-06-29 LAB — BLOOD GAS, ARTERIAL
Acid-Base Excess: 0.1 mmol/L (ref 0.0–2.0)
Drawn by: 38235
FIO2: 0.32 %
Patient temperature: 37
pCO2 arterial: 39 mmHg (ref 35.0–45.0)
pH, Arterial: 7.409 (ref 7.350–7.450)
pO2, Arterial: 71 mmHg — ABNORMAL LOW (ref 80.0–100.0)

## 2013-06-29 MED ORDER — SODIUM CHLORIDE 0.9 % IV SOLN
INTRAVENOUS | Status: AC
  Administered 2013-06-30: via INTRAVENOUS

## 2013-06-29 MED ORDER — IPRATROPIUM BROMIDE 0.02 % IN SOLN
0.5000 mg | Freq: Once | RESPIRATORY_TRACT | Status: AC
  Administered 2013-06-29: 0.5 mg via RESPIRATORY_TRACT
  Filled 2013-06-29: qty 2.5

## 2013-06-29 MED ORDER — LEVALBUTEROL HCL 0.63 MG/3ML IN NEBU
0.6300 mg | INHALATION_SOLUTION | RESPIRATORY_TRACT | Status: DC | PRN
  Administered 2013-06-30 (×2): 0.63 mg via RESPIRATORY_TRACT
  Filled 2013-06-29 (×3): qty 3

## 2013-06-29 MED ORDER — DICYCLOMINE HCL 10 MG PO CAPS: 10.0000 mg | ORAL_CAPSULE | Freq: Three times a day (TID) | ORAL | Status: DC | PRN

## 2013-06-29 MED ORDER — POLYETHYLENE GLYCOL 3350 17 GM/SCOOP PO POWD: 17.0000 g | Freq: Every day | ORAL | Status: DC | PRN

## 2013-06-29 MED ORDER — PANCRELIPASE (LIP-PROT-AMYL) 12000-38000 UNITS PO CPEP
3.0000 | ORAL_CAPSULE | Freq: Three times a day (TID) | ORAL | Status: DC
  Administered 2013-06-30 – 2013-07-01 (×4): 3 via ORAL
  Filled 2013-06-29: qty 3
  Filled 2013-06-29: qty 1
  Filled 2013-06-29 (×2): qty 3

## 2013-06-29 MED ORDER — ENOXAPARIN SODIUM 40 MG/0.4ML ~~LOC~~ SOLN
40.0000 mg | SUBCUTANEOUS | Status: DC
  Administered 2013-06-29 – 2013-06-30 (×2): 40 mg via SUBCUTANEOUS
  Filled 2013-06-29 (×2): qty 0.4

## 2013-06-29 MED ORDER — AMITRIPTYLINE HCL 25 MG PO TABS
50.0000 mg | ORAL_TABLET | Freq: Every day | ORAL | Status: DC
  Administered 2013-06-29 – 2013-06-30 (×2): 50 mg via ORAL
  Filled 2013-06-29 (×3): qty 2

## 2013-06-29 MED ORDER — LEVALBUTEROL HCL 0.63 MG/3ML IN NEBU
0.6300 mg | INHALATION_SOLUTION | RESPIRATORY_TRACT | Status: DC
  Administered 2013-06-29 – 2013-07-01 (×10): 0.63 mg via RESPIRATORY_TRACT
  Filled 2013-06-29 (×9): qty 3

## 2013-06-29 MED ORDER — POTASSIUM CHLORIDE CRYS ER 20 MEQ PO TBCR
40.0000 meq | EXTENDED_RELEASE_TABLET | Freq: Once | ORAL | Status: AC
  Administered 2013-06-29: 40 meq via ORAL
  Filled 2013-06-29: qty 2

## 2013-06-29 MED ORDER — DOCUSATE SODIUM 100 MG PO CAPS
100.0000 mg | ORAL_CAPSULE | Freq: Every day | ORAL | Status: DC
  Administered 2013-06-29 – 2013-06-30 (×2): 100 mg via ORAL
  Filled 2013-06-29 (×2): qty 1

## 2013-06-29 MED ORDER — METHYLPREDNISOLONE SODIUM SUCC 125 MG IJ SOLR
60.0000 mg | Freq: Four times a day (QID) | INTRAMUSCULAR | Status: DC
  Administered 2013-06-30 (×3): 60 mg via INTRAVENOUS
  Filled 2013-06-29 (×3): qty 2

## 2013-06-29 MED ORDER — PANCRELIPASE (LIP-PROT-AMYL) 12000-38000 UNITS PO CPEP: 2.0000 | ORAL_CAPSULE | ORAL | Status: DC | PRN

## 2013-06-29 MED ORDER — HYDROMORPHONE HCL PF 1 MG/ML IJ SOLN
0.5000 mg | INTRAMUSCULAR | Status: DC | PRN
  Administered 2013-06-30 – 2013-07-01 (×7): 0.5 mg via INTRAVENOUS
  Filled 2013-06-29 (×7): qty 1

## 2013-06-29 MED ORDER — PANTOPRAZOLE SODIUM 40 MG PO TBEC
40.0000 mg | DELAYED_RELEASE_TABLET | Freq: Two times a day (BID) | ORAL | Status: DC
  Administered 2013-06-29 – 2013-07-01 (×4): 40 mg via ORAL
  Filled 2013-06-29 (×4): qty 1

## 2013-06-29 MED ORDER — SODIUM CHLORIDE 0.9 % IJ SOLN: 3.0000 mL | Freq: Two times a day (BID) | INTRAMUSCULAR | Status: DC

## 2013-06-29 MED ORDER — LEVOFLOXACIN IN D5W 750 MG/150ML IV SOLN
750.0000 mg | Freq: Once | INTRAVENOUS | Status: AC
  Administered 2013-06-29: 750 mg via INTRAVENOUS
  Filled 2013-06-29: qty 150

## 2013-06-29 MED ORDER — INSULIN ASPART 100 UNIT/ML ~~LOC~~ SOLN
0.0000 [IU] | Freq: Three times a day (TID) | SUBCUTANEOUS | Status: DC
  Administered 2013-06-30: 15 [IU] via SUBCUTANEOUS
  Administered 2013-06-30: 11 [IU] via SUBCUTANEOUS
  Administered 2013-06-30: 15 [IU] via SUBCUTANEOUS
  Administered 2013-07-01: 7 [IU] via SUBCUTANEOUS

## 2013-06-29 MED ORDER — ACETAMINOPHEN 325 MG PO TABS: 650.0000 mg | ORAL_TABLET | Freq: Four times a day (QID) | ORAL | Status: DC | PRN

## 2013-06-29 MED ORDER — HYDROXYZINE HCL 25 MG PO TABS
25.0000 mg | ORAL_TABLET | Freq: Four times a day (QID) | ORAL | Status: DC | PRN
  Administered 2013-06-30: 25 mg via ORAL
  Filled 2013-06-29: qty 1

## 2013-06-29 MED ORDER — ALBUTEROL SULFATE (5 MG/ML) 0.5% IN NEBU
10.0000 mg | INHALATION_SOLUTION | Freq: Once | RESPIRATORY_TRACT | Status: AC
  Administered 2013-06-29: 10 mg via RESPIRATORY_TRACT
  Filled 2013-06-29: qty 2

## 2013-06-29 MED ORDER — HYDROMORPHONE HCL PF 1 MG/ML IJ SOLN
0.5000 mg | Freq: Once | INTRAMUSCULAR | Status: AC
  Administered 2013-06-29: 0.5 mg via INTRAVENOUS
  Filled 2013-06-29: qty 1

## 2013-06-29 MED ORDER — MAGNESIUM SULFATE 40 MG/ML IJ SOLN
2.0000 g | Freq: Once | INTRAMUSCULAR | Status: AC
  Administered 2013-06-29: 2 g via INTRAVENOUS
  Filled 2013-06-29: qty 50

## 2013-06-29 MED ORDER — ONDANSETRON HCL 4 MG/2ML IJ SOLN
4.0000 mg | Freq: Once | INTRAMUSCULAR | Status: AC
  Administered 2013-06-29: 4 mg via INTRAVENOUS
  Filled 2013-06-29: qty 2

## 2013-06-29 MED ORDER — ONDANSETRON HCL 4 MG PO TABS: 4.0000 mg | ORAL_TABLET | Freq: Four times a day (QID) | ORAL | Status: DC | PRN

## 2013-06-29 MED ORDER — LORAZEPAM 0.5 MG PO TABS
0.5000 mg | ORAL_TABLET | Freq: Three times a day (TID) | ORAL | Status: DC
  Administered 2013-06-29 – 2013-07-01 (×5): 0.5 mg via ORAL
  Filled 2013-06-29 (×5): qty 1

## 2013-06-29 MED ORDER — POLYETHYLENE GLYCOL 3350 17 G PO PACK
17.0000 g | PACK | Freq: Every day | ORAL | Status: DC | PRN
  Administered 2013-07-01: 17 g via ORAL
  Filled 2013-06-29: qty 1

## 2013-06-29 MED ORDER — LEVOFLOXACIN IN D5W 750 MG/150ML IV SOLN
750.0000 mg | INTRAVENOUS | Status: DC
  Administered 2013-06-30: 750 mg via INTRAVENOUS
  Filled 2013-06-29 (×3): qty 150

## 2013-06-29 MED ORDER — AMLODIPINE BESYLATE 5 MG PO TABS
10.0000 mg | ORAL_TABLET | Freq: Every day | ORAL | Status: DC
  Administered 2013-06-30: 10 mg via ORAL
  Filled 2013-06-29 (×2): qty 2

## 2013-06-29 MED ORDER — LUBIPROSTONE 24 MCG PO CAPS
24.0000 ug | ORAL_CAPSULE | Freq: Two times a day (BID) | ORAL | Status: DC
  Administered 2013-06-30 – 2013-07-01 (×3): 24 ug via ORAL
  Filled 2013-06-29 (×7): qty 1

## 2013-06-29 MED ORDER — GABAPENTIN 300 MG PO CAPS
600.0000 mg | ORAL_CAPSULE | Freq: Three times a day (TID) | ORAL | Status: DC
  Administered 2013-06-29 – 2013-07-01 (×5): 600 mg via ORAL
  Filled 2013-06-29 (×5): qty 2

## 2013-06-29 MED ORDER — IPRATROPIUM BROMIDE 0.02 % IN SOLN
0.5000 mg | RESPIRATORY_TRACT | Status: DC
  Administered 2013-06-30 – 2013-07-01 (×9): 0.5 mg via RESPIRATORY_TRACT
  Filled 2013-06-29 (×9): qty 2.5

## 2013-06-29 MED ORDER — FERROUS SULFATE 325 (65 FE) MG PO TABS
325.0000 mg | ORAL_TABLET | Freq: Every day | ORAL | Status: DC
  Administered 2013-06-30 – 2013-07-01 (×2): 325 mg via ORAL
  Filled 2013-06-29 (×2): qty 1

## 2013-06-29 MED ORDER — INSULIN ASPART 100 UNIT/ML ~~LOC~~ SOLN: 0.0000 [IU] | Freq: Every day | SUBCUTANEOUS | Status: DC

## 2013-06-29 MED ORDER — KETOROLAC TROMETHAMINE 30 MG/ML IJ SOLN
30.0000 mg | Freq: Once | INTRAMUSCULAR | Status: AC
  Administered 2013-06-29: 30 mg via INTRAVENOUS
  Filled 2013-06-29: qty 1

## 2013-06-29 MED ORDER — HYDROCODONE-ACETAMINOPHEN 10-325 MG PO TABS
1.0000 | ORAL_TABLET | Freq: Four times a day (QID) | ORAL | Status: DC | PRN
  Administered 2013-06-30: 1 via ORAL
  Filled 2013-06-29: qty 1

## 2013-06-29 MED ORDER — ACETAMINOPHEN 650 MG RE SUPP: 650.0000 mg | Freq: Four times a day (QID) | RECTAL | Status: DC | PRN

## 2013-06-29 MED ORDER — METHYLPREDNISOLONE SODIUM SUCC 125 MG IJ SOLR
125.0000 mg | Freq: Once | INTRAMUSCULAR | Status: AC
  Administered 2013-06-29: 125 mg via INTRAVENOUS
  Filled 2013-06-29: qty 2

## 2013-06-29 MED ORDER — ONDANSETRON HCL 4 MG/2ML IJ SOLN
4.0000 mg | Freq: Four times a day (QID) | INTRAMUSCULAR | Status: DC | PRN
  Administered 2013-06-30 (×3): 4 mg via INTRAVENOUS
  Filled 2013-06-29 (×3): qty 2

## 2013-06-29 NOTE — ED Notes (Signed)
3rd visit in last 2 weeks for sob/wheezing. Has been on multiple meds and has seen pcp with no relief. Pt has audible wheezing, obvious sob noted. Able to complete sentences. Tachypneic. Alert/oriented.

## 2013-06-29 NOTE — ED Notes (Signed)
Pt able to speak complete sentences at this time. Even/nonlabored resp. Pt states, " im feeling a lot better than before"

## 2013-06-29 NOTE — ED Notes (Signed)
Pt requesting pain medication for generalized aches. New order obtained Dr. Estell Harpin

## 2013-06-29 NOTE — ED Provider Notes (Signed)
CSN: [id number]     Arrival date & time 06/29/13  1638 History  This chart was scribed for Benny Lennert, MD by Bennett Scrape, ED Scribe. This patient was seen in room APA02/APA02 and the patient's care was started at 4:45 PM.   Chief Complaint  Patient presents with  . Shortness of Breath    Patient is a 42 y.o. female presenting with shortness of breath. The history is provided by the patient. No language interpreter was used.  Shortness of Breath Severity:  Severe Onset quality:  Gradual Duration:  2 weeks Timing:  Constant Progression:  Worsening Chronicity:  Recurrent Relieved by:  Nothing Worsened by:  Nothing tried Ineffective treatments: Nebulizer. Associated symptoms: cough, fever and wheezing   Associated symptoms: no abdominal pain, no chest pain, no headaches and no rash   Cough:    Cough characteristics:  Non-productive (Previously Productive, Has Since Resolved)   Severity:  Mild   Onset quality:  Gradual Wheezing:    Severity:  Moderate   Onset quality:  Sudden  HPI Comments: [name] is a 42 y.o. female who presents to the Emergency Department complaining of SOB that started about 2 weeks ago with associated myalgia that began today. She states she came to the hospital3 days after the onset of sx and received a steroid shot. She then followed up with her PCP and received another steroid shot that made her breathing much worse and now complains of chest pain with deep breathing only. She states she is currently taking a nebulizer to help with breathing at home with no improvement. She states associated subjective fever and productive cough that previously resulted in a pasty, white discharge but now produces nothing. She states to have been placed on a ventilator approximately 4-6 years ago but has not been on one since. She also states to have been admitted to the hospital overnight around this time last year for pneumonia.   Past Medical History   Diagnosis Date  . Asthma   . Depression   . Migraine   . Pancreatitis chronic Idiopathic    Treated by Dr. Margaretha Glassing at [facility] in the past with celiac blocks.  . Bipolar 1 disorder   . Hyperlipidemia 02/03/2012  . Diabetes mellitus   . COPD (chronic obstructive pulmonary disease)   . Neck pain 06/08/2012  . Chronic abdominal pain   . Anemia   . Anxiety   . Hiatal hernia   . Esophagitis    Past Surgical History  Procedure Laterality Date  . Cholecystectomy    . Tonsillectomy    . Abdominal hysterectomy    . Esophagogastroduodenoscopy  02/20/2008    WUJ:WJXBJYNWG distal esophageal mucosa, suspicious for neoplasm/Hiatal hernia otherwise normal   . Esophagogastroduodenoscopy  04/03/2008    NFA:[id number] hiatal hernia, otherwise normal/Short, tight, benign-appearing peptic stricture  . Ileocolonoscopy  06/05/2008      EXB:MWUXLKG anal canal, otherwise normal rectum, colon  . Esophagogastroduodenoscopy  November 16, 2012    Dr. Teena Dunk: hiatal hernia, esophagitis,   . Givens capsule study N/A 12/05/2012    Few superficial erosions but nothing found to explain iron deficiency anemia.   . Colonoscopy N/A 12/05/2012    MWN:UUVOZD rectum, colon and terminal ileum  . Celiac plexus block     Family History  Problem Relation Age of Onset  . Asthma Other   . Coronary artery disease Neg Hx   . Colon cancer Neg Hx    History  Substance Use Topics  .  Smoking status: Former Smoker -- 1.00 packs/day for 15 years    Types: Cigarettes    Quit date: 06/06/2013  . Smokeless tobacco: Former Neurosurgeon  . Alcohol Use: No   OB History   Grav Para Term Preterm Abortions TAB SAB Ect Mult Living                 Review of Systems  Constitutional: Positive for fever. Negative for appetite change and fatigue.  HENT: Negative for congestion, ear discharge and sinus pressure.   Eyes: Negative for discharge.  Respiratory: Positive for cough, shortness of breath and wheezing.    Cardiovascular: Negative for chest pain.  Gastrointestinal: Negative for abdominal pain and diarrhea.  Genitourinary: Negative for frequency and hematuria.  Musculoskeletal: Negative for back pain.  Skin: Negative for rash.  Neurological: Negative for seizures and headaches.  Psychiatric/Behavioral: Negative for hallucinations.  All other systems reviewed and are negative.    Allergies  Penicillins; Sulfa antibiotics; and Prednisone  Home Medications   Current Outpatient Rx  Name  Route  Sig  Dispense  Refill  . albuterol (PROVENTIL HFA;VENTOLIN HFA) 108 (90 BASE) MCG/ACT inhaler   Inhalation   Inhale 2 puffs into the lungs daily as needed. For shortness of breath         . albuterol (PROVENTIL HFA;VENTOLIN HFA) 108 (90 BASE) MCG/ACT inhaler   Inhalation   Inhale 2 puffs into the lungs every 4 (four) hours as needed for wheezing or shortness of breath.   1 Inhaler   0   . albuterol (PROVENTIL) (2.5 MG/3ML) 0.083% nebulizer solution   Nebulization   Take 3 mLs (2.5 mg total) by nebulization every 4 (four) hours as needed for wheezing or shortness of breath.   75 mL   0   . amitriptyline (ELAVIL) 50 MG tablet   Oral   Take 50 mg by mouth at bedtime.         Marland Kitchen amLODipine (NORVASC) 10 MG tablet   Oral   Take 10 mg by mouth at bedtime.          . dicyclomine (BENTYL) 10 MG capsule   Oral   Take 10 mg by mouth 3 (three) times daily as needed. Take 1 capsule (10 mg total) by mouth 3 (three) times daily as needed (abdominal cramps).         . docusate sodium (COLACE) 100 MG capsule   Oral   Take 100 mg by mouth at bedtime.          . ferrous sulfate 325 (65 FE) MG tablet   Oral   Take 325 mg by mouth daily.         Marland Kitchen gabapentin (NEURONTIN) 300 MG capsule   Oral   Take 600 mg by mouth 3 (three) times daily.          Marland Kitchen HYDROcodone-acetaminophen (NORCO) 10-325 MG per tablet   Oral   Take 1 tablet by mouth every 6 (six) hours as needed for pain.          . hydrOXYzine (ATARAX/VISTARIL) 25 MG tablet   Oral   Take 1 tablet (25 mg total) by mouth every 6 (six) hours as needed for anxiety.   15 tablet   0   . ipratropium-albuterol (DUONEB) 0.5-2.5 (3) MG/3ML SOLN   Inhalation   Inhale 3 mLs into the lungs every 6 (six) hours as needed. Take 3 mLs by nebulization every 6 (six) hours as needed for Wheezing.         Marland Kitchen  LORazepam (ATIVAN) 0.5 MG tablet   Oral   Take 0.5 mg by mouth every 8 (eight) hours.         . ondansetron (ZOFRAN) 4 MG tablet   Oral   Take 4 mg by mouth every 8 (eight) hours as needed for nausea.          . Pancrelipase, Lip-Prot-Amyl, (ZENPEP) 20000 UNITS CPEP   Oral   Take 3 capsules (60,000 Units total) by mouth 3 (three) times daily with meals. 2 with snacks   400 capsule   3   . pantoprazole (PROTONIX) 40 MG tablet   Oral   Take 40 mg by mouth 2 (two) times daily.         . polyethylene glycol powder (GLYCOLAX/MIRALAX) powder   Oral   Take 17 g by mouth daily as needed. for Constipation.         . promethazine (PHENERGAN) 25 MG tablet   Oral   Take 25 mg by mouth every 6 (six) hours as needed for nausea or vomiting.          Triage Vitals: BP 139/81  Pulse 135  Temp(Src) 98.5 F (36.9 C) (Oral)  Resp 28  SpO2 85% Physical Exam  Nursing note and vitals reviewed. Constitutional: She is oriented to person, place, and time. She appears well-developed and well-nourished.  HENT:  Head: Normocephalic and atraumatic.  Eyes: Conjunctivae and EOM are normal. No scleral icterus.  Neck: Neck supple. No thyromegaly present.  Cardiovascular: Regular rhythm.  Exam reveals no gallop and no friction rub.   No murmur heard. Tachycardia  Pulmonary/Chest: Effort normal. No stridor. No respiratory distress. She has wheezes (Mild Throughout). She has no rales. She exhibits no tenderness.  Abdominal: She exhibits no distension. There is no tenderness. There is no rebound.  Musculoskeletal: Normal  range of motion. She exhibits no edema.  Lymphadenopathy:    She has no cervical adenopathy.  Neurological: She is alert and oriented to person, place, and time. She exhibits normal muscle tone. Coordination normal.  Skin: Skin is warm and dry. No rash noted. No erythema.  Psychiatric: She has a normal mood and affect. Her behavior is normal.    ED Course  Procedures (including critical care time) Medications  albuterol (PROVENTIL) (5 MG/ML) 0.5% nebulizer solution 10 mg (not administered)  ipratropium (ATROVENT) nebulizer solution 0.5 mg (not administered)  methylPREDNISolone sodium succinate (SOLU-MEDROL) 125 mg/2 mL injection 125 mg (not administered)     DIAGNOSTIC STUDIES: Oxygen Saturation is 85% on room air, low by my interpretation.    COORDINATION OF CARE: 4:45 PM-Discussed treatment plan which includes blood work, CBC, CMP with pt at bedside and pt agreed to plan.   6:01 PM: Upon recheck, pts wheezing has been minimally reduced with treatment. Diagnosed with Pneumonia. Patient will be admitted to the hospital.      Labs Review Labs Reviewed  CBC WITH DIFFERENTIAL - Abnormal; Notable for the following:    WBC 16.8 (*)    Neutro Abs 12.3 (*)    Monocytes Absolute 1.1 (*)    Eosinophils Relative 6 (*)    Eosinophils Absolute 1.1 (*)    All other components within normal limits  COMPREHENSIVE METABOLIC PANEL - Abnormal; Notable for the following:    Sodium 133 (*)    Potassium 3.4 (*)    Chloride 95 (*)    Glucose, Bld 236 (*)    All other components within normal limits  CULTURE, BLOOD (ROUTINE X 2)  CULTURE, BLOOD (ROUTINE X 2)  PRO B NATRIURETIC PEPTIDE  TROPONIN I   Imaging Review Dg Chest Port 1 View  06/29/2013   CLINICAL DATA:  Shortness of breath and wheezing  EXAM: PORTABLE CHEST - 1 VIEW  COMPARISON:  June 20, 2013  FINDINGS: The heart size and mediastinal contours are within normal limits. There is right perihilar pneumonia. There is no pleural  effusion. The visualized skeletal structures are stable.  IMPRESSION: Right perihilar pneumonia.   Electronically Signed   By: Sherian Rein M.D.   On: 06/29/2013 17:15    EKG Interpretation   None       CRITICAL CARE Performed by: Loyal Holzheimer L Total critical care time:40 Critical care time was exclusive of separately billable procedures and treating other patients. Critical care was necessary to treat or prevent imminent or life-threatening deterioration. Critical care was time spent personally by me on the following activities: development of treatment plan with patient and/or surrogate as well as nursing, discussions with consultants, evaluation of patient's response to treatment, examination of patient, obtaining history from patient or surrogate, ordering and performing treatments and interventions, ordering and review of laboratory studies, ordering and review of radiographic studies, pulse oximetry and re-evaluation of patient's condition.   MDM  Admit pneumonia    Benny Lennert, MD 06/29/13 [phone]

## 2013-06-29 NOTE — H&P (Signed)
[name]  [name]:[id number] DOB: May 10, 1971 DOA: 06/29/2013   PCP: Cassell Smiles., MD  Specialists: None  Chief Complaint: Shortness of breath and wheezing for the last 9 days  HPI: [name] BELOW is a 42 y.o. female with a past medical history of asthma, idiopathic pancreatitis, bipolar disorder, hypertension, chronic back pain, and migraine headaches, who was in her usual state of health about 9 days ago, when she started having cough with shortness of breath. Denies any fever, and chills. The cough is dry. No hemoptysis. She tried taking the nebulizer treatments, that she had at home without any relief. She subsequently presented to the emergency department on a Tuesday. She was diagnosed with bronchitis and ear infection. She was given steroids, and inhaler. She however, did not feel better and so went to her doctor this past Monday, and she was given a steroid injection and was started on Levaquin. However, she started feeling worse and so decided to come in to the hospital today. She's been having generalized body aches, headaches, back pain. Denies any leg swelling. Chest pain is mainly with deep breathing and wheezing. None otherwise. She's had yellowish expectoration with her cough in the last couple days. Has had nausea and vomiting as well. After receiving breathing treatments in the ED, she is feeling slightly better.  Home Medications: Prior to Admission medications   Medication Sig Start Date End Date Taking? Authorizing Provider  albuterol (PROVENTIL HFA;VENTOLIN HFA) 108 (90 BASE) MCG/ACT inhaler Inhale 2 puffs into the lungs daily as needed. For shortness of breath   Yes Historical Provider, MD  albuterol (PROVENTIL) (2.5 MG/3ML) 0.083% nebulizer solution Take 3 mLs (2.5 mg total) by nebulization every 4 (four) hours as needed for wheezing or shortness of breath. 06/20/13  Yes Laray Anger, DO  amitriptyline (ELAVIL) 50 MG  tablet Take 50 mg by mouth at bedtime.   Yes Historical Provider, MD  amLODipine (NORVASC) 10 MG tablet Take 10 mg by mouth at bedtime.    Yes Historical Provider, MD  dicyclomine (BENTYL) 10 MG capsule Take 10 mg by mouth 3 (three) times daily as needed. Take 1 capsule (10 mg total) by mouth 3 (three) times daily as needed (abdominal cramps). 03/09/13 03/09/14 Yes Historical Provider, MD  docusate sodium (COLACE) 100 MG capsule Take 100 mg by mouth at bedtime.    Yes Historical Provider, MD  ferrous sulfate 325 (65 FE) MG tablet Take 325 mg by mouth daily.   Yes Historical Provider, MD  gabapentin (NEURONTIN) 300 MG capsule Take 600 mg by mouth 3 (three) times daily.    Yes Historical Provider, MD  HYDROcodone-acetaminophen (NORCO) 10-325 MG per tablet Take 1 tablet by mouth every 6 (six) hours as needed for pain.   Yes Historical Provider, MD  hydrOXYzine (ATARAX/VISTARIL) 25 MG tablet Take 1 tablet (25 mg total) by mouth every 6 (six) hours as needed for anxiety. 02/27/13  Yes Junius Argyle, MD  ipratropium-albuterol (DUONEB) 0.5-2.5 (3) MG/3ML SOLN Inhale 3 mLs into the lungs every 6 (six) hours as needed. Take 3 mLs by nebulization every 6 (six) hours as needed for Wheezing.   Yes Historical Provider, MD  LORazepam (ATIVAN) 0.5 MG tablet Take 0.5 mg by mouth every 8 (eight) hours.   Yes Historical Provider, MD  lubiprostone (AMITIZA) 24 MCG capsule Take 24 mcg by mouth 2 (two) times daily with a meal.   Yes Historical Provider, MD  ondansetron (ZOFRAN) 4 MG tablet Take  4 mg by mouth every 8 (eight) hours.    Yes Historical Provider, MD  Pancrelipase, Lip-Prot-Amyl, (ZENPEP) 20000 UNITS CPEP Take 3 capsules (60,000 Units total) by mouth 3 (three) times daily with meals. 2 with snacks 02/07/13  Yes Tiffany Kocher, PA-C  pantoprazole (PROTONIX) 40 MG tablet Take 40 mg by mouth 2 (two) times daily.   Yes Historical Provider, MD  polyethylene glycol powder (GLYCOLAX/MIRALAX) powder Take 17 g by mouth  daily as needed. for Constipation.   Yes Historical Provider, MD  promethazine (PHENERGAN) 25 MG tablet Take 25 mg by mouth every 6 (six) hours as needed for nausea or vomiting.   Yes Historical Provider, MD    Allergies:  Allergies  Allergen Reactions  . Penicillins Shortness Of Breath       . Sulfa Antibiotics Shortness Of Breath  . Prednisone Other (See Comments)    Pancreatitis, but has to take for asthma sometimes    Past Medical History: Past Medical History  Diagnosis Date  . Asthma   . Depression   . Migraine   . Pancreatitis chronic Idiopathic    Treated by Dr. Margaretha Glassing at [facility] in the past with celiac blocks.  . Bipolar 1 disorder   . Hyperlipidemia 02/03/2012  . Diabetes mellitus   . COPD (chronic obstructive pulmonary disease)   . Neck pain 06/08/2012  . Chronic abdominal pain   . Anemia   . Anxiety   . Hiatal hernia   . Esophagitis     Past Surgical History  Procedure Laterality Date  . Cholecystectomy    . Tonsillectomy    . Abdominal hysterectomy    . Esophagogastroduodenoscopy  02/20/2008    NGE:XBMWUXLKG distal esophageal mucosa, suspicious for neoplasm/Hiatal hernia otherwise normal   . Esophagogastroduodenoscopy  04/03/2008    MWN:[id number]-YQIHK hiatal hernia, otherwise normal/Short, tight, benign-appearing peptic stricture  . Ileocolonoscopy  06/05/2008      VQQ:VZDGLOV anal canal, otherwise normal rectum, colon  . Esophagogastroduodenoscopy  November 16, 2012    Dr. Teena Dunk: hiatal hernia, esophagitis,   . Givens capsule study N/A 12/05/2012    Few superficial erosions but nothing found to explain iron deficiency anemia.   . Colonoscopy N/A 12/05/2012    FIE:PPIRJJ rectum, colon and terminal ileum  . Celiac plexus block      Social History: She lives in [place]. She is on disability. She smokes half a pack of cigarettes a daily basis, but hasn't smoked in the last 2 weeks. Denies any alcohol use. Denies any illicit drug use. Usually  independent with daily activities.  Family History:  Family History  Problem Relation Age of Onset  . Asthma Other   . Coronary artery disease Neg Hx   . Colon cancer Neg Hx   There is history of hypertension, stroke, and asthma in the family.  Review of Systems - History obtained from the patient General ROS: positive for  - fatigue Psychological ROS: positive for - anxiety Ophthalmic ROS: negative ENT ROS: negative Allergy and Immunology ROS: negative Hematological and Lymphatic ROS: negative Endocrine ROS: negative Respiratory ROS: as in hpi Cardiovascular ROS: as in hpi Gastrointestinal ROS: as in hpi Genito-Urinary ROS: no dysuria, trouble voiding, or hematuria Musculoskeletal ROS: negative Neurological ROS: no TIA or stroke symptoms Dermatological ROS: negative  [name] Examination  Filed Vitals:   06/29/13 1702 06/29/13 1709 06/29/13 1833 06/29/13 1927  BP:   120/77 110/93  Pulse:   127 124  Temp:    98.3 F (36.8 C)  TempSrc:    Oral  Resp:    15  SpO2: 89% 93% 96% 94%    General appearance: alert, cooperative, appears stated age and no distress Head: Normocephalic, without obvious abnormality, atraumatic Eyes: conjunctivae/corneas clear. PERRL, EOM's intact.  Throat: lips, mucosa, and tongue normal; teeth and gums normal Neck: no adenopathy, no carotid bruit, no JVD, supple, symmetrical, trachea midline and thyroid not enlarged, symmetric, no tenderness/mass/nodules Resp: She has diffuse end expiratory wheezing bilaterally, with a few rhonchi. No definite crackles appreciated. Cardio: S1-S2 is tachycardic. Regular. No S3, S4. No rubs, murmurs, or bruit. GI: soft, non-tender; bowel sounds normal; no masses,  no organomegaly Extremities: extremities normal, atraumatic, no cyanosis or edema Pulses: 2+ and symmetric Skin: Skin color, texture, turgor normal. No rashes or lesions Lymph nodes: Cervical, supraclavicular, and axillary nodes normal. Neurologic: She  is alert and oriented x3. No focal neurological deficits are present.  Laboratory Data: Results for orders placed during the hospital encounter of 06/29/13 (from the past 48 hour(s))  CBC WITH DIFFERENTIAL     Status: Abnormal   Collection Time    06/29/13  5:00 PM      Result Value Range   WBC 16.8 (*) 4.0 - 10.5 K/uL   RBC 4.51  3.87 - 5.11 MIL/uL   Hemoglobin 13.8  12.0 - 15.0 g/dL   HCT 62.9  52.8 - 41.3 %   MCV 88.5  78.0 - 100.0 fL   MCH 30.6  26.0 - 34.0 pg   MCHC 34.6  30.0 - 36.0 g/dL   RDW 24.4  01.0 - 27.2 %   Platelets 266  150 - 400 K/uL   Neutrophils Relative % 73  43 - 77 %   Neutro Abs 12.3 (*) 1.7 - 7.7 K/uL   Lymphocytes Relative 14  12 - 46 %   Lymphs Abs 2.3  0.7 - 4.0 K/uL   Monocytes Relative 6  3 - 12 %   Monocytes Absolute 1.1 (*) 0.1 - 1.0 K/uL   Eosinophils Relative 6 (*) 0 - 5 %   Eosinophils Absolute 1.1 (*) 0.0 - 0.7 K/uL   Basophils Relative 0  0 - 1 %   Basophils Absolute 0.1  0.0 - 0.1 K/uL  COMPREHENSIVE METABOLIC PANEL     Status: Abnormal   Collection Time    06/29/13  5:00 PM      Result Value Range   Sodium 133 (*) 135 - 145 mEq/L   Potassium 3.4 (*) 3.5 - 5.1 mEq/L   Chloride 95 (*) 96 - 112 mEq/L   CO2 25  19 - 32 mEq/L   Glucose, Bld 236 (*) 70 - 99 mg/dL   BUN 9  6 - 23 mg/dL   Creatinine, Ser 5.36  0.50 - 1.10 mg/dL   Calcium 9.6  8.4 - 64.4 mg/dL   Total Protein 7.3  6.0 - 8.3 g/dL   Albumin 3.7  3.5 - 5.2 g/dL   AST 18  0 - 37 U/L   ALT 19  0 - 35 U/L   Alkaline Phosphatase 91  39 - 117 U/L   Total Bilirubin 0.6  0.3 - 1.2 mg/dL   GFR calc non Af Amer >90  >90 mL/min   GFR calc Af Amer >90  >90 mL/min   Comment: (NOTE)     The eGFR has been calculated using the CKD EPI equation.     This calculation has not been validated in all clinical situations.  eGFR's persistently <90 mL/min signify possible Chronic Kidney     Disease.  PRO B NATRIURETIC PEPTIDE     Status: None   Collection Time    06/29/13  5:00 PM       Result Value Range   Pro B Natriuretic peptide (BNP) 37.5  0 - 125 pg/mL  TROPONIN I     Status: None   Collection Time    06/29/13  5:00 PM      Result Value Range   Troponin I <0.30  <0.30 ng/mL   Comment:            Due to the release kinetics of cTnI,     a negative result within the first hours     of the onset of symptoms does not rule out     myocardial infarction with certainty.     If myocardial infarction is still suspected,     repeat the test at appropriate intervals.  BLOOD GAS, ARTERIAL     Status: Abnormal   Collection Time    06/29/13  7:18 PM      Result Value Range   FIO2 0.32     Delivery systems NASAL CANNULA     pH, Arterial 7.409  7.350 - 7.450   pCO2 arterial 39.0  35.0 - 45.0 mmHg   pO2, Arterial 71.0 (*) 80.0 - 100.0 mmHg   Bicarbonate 24.2 (*) 20.0 - 24.0 mEq/L   TCO2 21.6  0 - 100 mmol/L   Acid-Base Excess 0.1  0.0 - 2.0 mmol/L   O2 Saturation 94.6     Patient temperature 37.0     Collection site RIGHT RADIAL     Drawn by [phone]     Sample type ARTERIAL DRAW     Allens test (pass/fail) PASS  PASS    Radiology Reports: Dg Chest Port 1 View  06/29/2013   CLINICAL DATA:  Shortness of breath and wheezing  EXAM: PORTABLE CHEST - 1 VIEW  COMPARISON:  June 20, 2013  FINDINGS: The heart size and mediastinal contours are within normal limits. There is right perihilar pneumonia. There is no pleural effusion. The visualized skeletal structures are stable.  IMPRESSION: Right perihilar pneumonia.   Electronically Signed   By: Sherian Rein M.D.   On: 06/29/2013 17:15    Electrocardiogram: Sinus tachycardia at 129 beats per minute. Normal axis. Intervals are normal. No Q waves. No concerning ST or T-wave changes are noted. Right bundle branch block is noted.  Problem List  Principal Problem:   CAP (community acquired pneumonia) Active Problems:   Bipolar disorder   MIGRAINE HEADACHE   HYPERTENSION   Constipation   Tobacco abuse   Acute  bronchitis   Assessment: This is a 42 year old, Caucasian female, who presents with worsening shortness of breath, wheezing, cough for the last 9 days or so. Chest x-ray suggests pneumonia, which is most likely community-acquired. She also has an element of acute bronchitis. She smokes and likely has underlying COPD apart from her asthma.  Plan: #1 community-acquired pneumonia: She'll be treated with Levofloxacin. She'll be monitored in the hospital.  #2 acute bronchitis: She'll be given steroids, nebulizer treatments. Peak flows will be checked.  #3 hypertension: Monitor blood pressures closely. Resume her antihypertensive regimen..  #4 chronic constipation: Continue with her bowel regimen. TSH level will be checked.  #5 hyperglycemia, likely due to steroids: HbA1c will be checked. She'll be placed on a sliding scale insulin coverage.  #6 history of Idiopathic pancreatitis:  Currently she is asymptomatic. Continue to monitor. She states that prednisone has caused her pancreatitis in the past. However, she is able to tolerate parenteral agents.   #7 history of bipolar disorder, anxiety: Continue with her psychotropic medications   DVT Prophylaxis: Lovenox Code Status: Full code Family Communication: Discussed with the patient and her mother who is at bedside  Disposition Plan: Admit to telemetry   Further management decisions will depend on results of further testing and patient's response to treatment.  [facility]  [facility] Pager [phone]  If 7PM-7AM, please contact night-coverage [email or web address] Password [facility]  06/29/2013, 8:37 PM

## 2013-06-30 DIAGNOSIS — J45901 Unspecified asthma with (acute) exacerbation: Secondary | ICD-10-CM | POA: Diagnosis not present

## 2013-06-30 DIAGNOSIS — J189 Pneumonia, unspecified organism: Secondary | ICD-10-CM | POA: Diagnosis not present

## 2013-06-30 DIAGNOSIS — J9600 Acute respiratory failure, unspecified whether with hypoxia or hypercapnia: Secondary | ICD-10-CM | POA: Diagnosis not present

## 2013-06-30 LAB — URINALYSIS, ROUTINE W REFLEX MICROSCOPIC
Bilirubin Urine: NEGATIVE
Glucose, UA: >1000 mg/dL — AB
Hgb urine dipstick: NEGATIVE
Ketones, ur: NEGATIVE mg/dL
Leukocytes, UA: NEGATIVE
Nitrite: NEGATIVE
pH: 5.5 (ref 5.0–8.0)

## 2013-06-30 LAB — COMPREHENSIVE METABOLIC PANEL
AST: 14 U/L (ref 0–37)
Albumin: 3.4 g/dL — ABNORMAL LOW (ref 3.5–5.2)
BUN: 7 mg/dL (ref 6–23)
Calcium: 9.6 mg/dL (ref 8.4–10.5)
Chloride: 100 mEq/L (ref 96–112)
GFR calc Af Amer: >90 mL/min (ref >90)
Glucose, Bld: 278 mg/dL — ABNORMAL HIGH (ref 70–99)
Potassium: 4.4 mEq/L (ref 3.5–5.1)
Sodium: 135 mEq/L (ref 135–145)
Total Bilirubin: 0.4 mg/dL (ref 0.3–1.2)
Total Protein: 6.8 g/dL (ref 6.0–8.3)

## 2013-06-30 LAB — GLUCOSE, CAPILLARY: Glucose-Capillary: 253 mg/dL — ABNORMAL HIGH (ref 70–99)

## 2013-06-30 LAB — CBC
HCT: 41.6 % (ref 36.0–46.0)
Hemoglobin: 14.3 g/dL (ref 12.0–15.0)
Platelets: 250 10*3/uL (ref 150–400)
RBC: 4.63 MIL/uL (ref 3.87–5.11)
WBC: 9.3 10*3/uL (ref 4.0–10.5)

## 2013-06-30 LAB — HEMOGLOBIN A1C
Hgb A1c MFr Bld: 7 % — ABNORMAL HIGH (ref <5.7)
Mean Plasma Glucose: 154 mg/dL — ABNORMAL HIGH (ref <117)

## 2013-06-30 LAB — URINE MICROSCOPIC-ADD ON

## 2013-06-30 MED ORDER — METHYLPREDNISOLONE SODIUM SUCC 40 MG IJ SOLR
40.0000 mg | Freq: Three times a day (TID) | INTRAMUSCULAR | Status: DC
  Administered 2013-06-30 – 2013-07-01 (×2): 40 mg via INTRAVENOUS
  Filled 2013-06-30 (×2): qty 1

## 2013-06-30 MED ORDER — INSULIN GLARGINE 100 UNIT/ML ~~LOC~~ SOLN
8.0000 [IU] | Freq: Every day | SUBCUTANEOUS | Status: DC
  Administered 2013-06-30: 8 [IU] via SUBCUTANEOUS
  Filled 2013-06-30 (×3): qty 0.08

## 2013-06-30 NOTE — Progress Notes (Signed)
PATIENT DETAILS Name: [name] Age: 42 y.o. Sex: female Date of Birth: 1970-11-22 Admit Date: 06/29/2013 Admitting Physician Osvaldo Shipper, MD ION:[id number] J., MD  Subjective: Better, but still short of breath when ambulating.  Assessment/Plan: Principal Problem:   CAP (community acquired pneumonia) - Clinic improved than on presentation. Afebrile, leukocytosis. - Continue with levofloxacin-day 2  Active Problems: Asthma with acute exacerbation` - Minimal wheezing, but with very good air entry bilaterally - Continue Solu-Medrol, but taper, continue nebulized bronchodilators. - Reassess in a.m.  Acute hypoxic respiratory failure - Secondary to above 2 conditions - Treat above and reassess tomorrow - Taper off oxygen  Hypertension - Stable, continue with amlodipine  GERD -Continue with Protonix  History of idiopathic pancreatitis - Asymptomatic, without any abdominal pain. - Continue pancreatic enzymes    Bipolar disorder - Currently stable - Continue with current medications  Hyperglycemia - Continue SSI, not sure whether she has diabetes-await A1c  Disposition: Remain inpatient  DVT Prophylaxis: Prophylactic Lovenox  Code Status: Full code   Family Communication None at bedside  Procedures:  None  CONSULTS:  None  Time spent 40 minutes-which includes 50% of the time with face-to-face with patient and family.   MEDICATIONS: Scheduled Meds: . amitriptyline  50 mg Oral QHS  . amLODipine  10 mg Oral QHS  . docusate sodium  100 mg Oral QHS  . enoxaparin (LOVENOX) injection  40 mg Subcutaneous Q24H  . ferrous sulfate  325 mg Oral Daily  . gabapentin  600 mg Oral TID  . insulin aspart  0-20 Units Subcutaneous TID WC  . insulin aspart  0-5 Units Subcutaneous QHS  . ipratropium  0.5 mg Nebulization Q4H  . levalbuterol  0.63 mg Nebulization Q4H  . levofloxacin (LEVAQUIN) IV  750 mg Intravenous Q24H  .  lipase/protease/amylase  3 capsule Oral TID WC  . LORazepam  0.5 mg Oral Q8H  . lubiprostone  24 mcg Oral BID WC  . methylPREDNISolone (SOLU-MEDROL) injection  60 mg Intravenous Q6H  . pantoprazole  40 mg Oral BID  . sodium chloride  3 mL Intravenous Q12H   Continuous Infusions:  PRN Meds:.acetaminophen, acetaminophen, dicyclomine, HYDROcodone-acetaminophen, HYDROmorphone (DILAUDID) injection, hydrOXYzine, levalbuterol, lipase/protease/amylase, ondansetron (ZOFRAN) IV, ondansetron, polyethylene glycol  Antibiotics: Anti-infectives   Start     Dose/Rate Route Frequency Ordered Stop   06/30/13 1800  levofloxacin (LEVAQUIN) IVPB 750 mg     750 mg 100 mL/hr over 90 Minutes Intravenous Every 24 hours 06/29/13 2111 07/05/13 1759   06/29/13 1815  levofloxacin (LEVAQUIN) IVPB 750 mg     750 mg 100 mL/hr over 90 Minutes Intravenous  Once 06/29/13 1804 06/29/13 2000       PHYSICAL EXAM: Vital signs in last 24 hours: Filed Vitals:   06/30/13 0040 06/30/13 0324 06/30/13 0428 06/30/13 1155  BP:   124/76   Pulse:   113   Temp:   98 F (36.7 C)   TempSrc:   Oral   Resp:   16   Height:      Weight:      SpO2: 98% 89% 94% 94%    Weight change:  Filed Weights   06/29/13 2103  Weight: 71 kg (156 lb 8.4 oz)   Body mass index is 28.62 kg/(m^2).   Gen Exam: Awake and alert with clear speech.   Neck: Supple, No JVD.   Chest: Good air entry bilaterally, scattered rhonchi all over   CVS: S1 S2 Regular, no murmurs.  Abdomen: soft, BS +,  non tender, non distended. Extremities: no edema, lower extremities warm to touch. Neurologic: Non Focal.   Skin: No Rash.   Wounds: N/A.    Intake/Output from previous day:  Intake/Output Summary (Last 24 hours) at 06/30/13 1403 Last data filed at 06/30/13 0700  Gross per 24 hour  Intake    840 ml  Output    250 ml  Net    590 ml     LAB RESULTS: CBC  Recent Labs Lab 06/29/13 1700 06/30/13 0552  WBC 16.8* 9.3  HGB 13.8 14.3  HCT 39.9  41.6  PLT 266 250  MCV 88.5 89.8  MCH 30.6 30.9  MCHC 34.6 34.4  RDW 12.2 12.2  LYMPHSABS 2.3  --   MONOABS 1.1*  --   EOSABS 1.1*  --   BASOSABS 0.1  --     Chemistries   Recent Labs Lab 06/29/13 1700 06/30/13 0552  NA 133* 135  K 3.4* 4.4  CL 95* 100  CO2 25 26  GLUCOSE 236* 278*  BUN 9 7  CREATININE 0.60 0.59  CALCIUM 9.6 9.6    CBG:  Recent Labs Lab 06/30/13 0844 06/30/13 1111  GLUCAP 253* 315*    GFR Estimated Creatinine Clearance: 82.9 ml/min (by C-G formula based on Cr of 0.59).  Coagulation profile No results found for this basename: INR, PROTIME,  in the last 168 hours  Cardiac Enzymes  Recent Labs Lab 06/29/13 1700  TROPONINI <0.30    No components found with this basename: POCBNP,  No results found for this basename: DDIMER,  in the last 72 hours No results found for this basename: HGBA1C,  in the last 72 hours No results found for this basename: CHOL, HDL, LDLCALC, TRIG, CHOLHDL, LDLDIRECT,  in the last 72 hours No results found for this basename: TSH, T4TOTAL, FREET3, T3FREE, THYROIDAB,  in the last 72 hours No results found for this basename: VITAMINB12, FOLATE, FERRITIN, TIBC, IRON, RETICCTPCT,  in the last 72 hours No results found for this basename: LIPASE, AMYLASE,  in the last 72 hours  Urine Studies No results found for this basename: UACOL, UAPR, USPG, UPH, UTP, UGL, UKET, UBIL, UHGB, UNIT, UROB, ULEU, UEPI, UWBC, URBC, UBAC, CAST, CRYS, UCOM, BILUA,  in the last 72 hours  MICROBIOLOGY: Recent Results (from the past 240 hour(s))  CULTURE, BLOOD (ROUTINE X 2)     Status: None   Collection Time    06/29/13  5:07 PM      Result Value Range Status   Specimen Description BLOOD LEFT HAND   Final   Special Requests     Final   Value: BOTTLES DRAWN AEROBIC AND ANAEROBIC AEB=10CC ANA=8CC   Culture NO GROWTH 1 DAY   Final   Report Status PENDING   Incomplete  CULTURE, BLOOD (ROUTINE X 2)     Status: None   Collection Time     06/29/13  5:15 PM      Result Value Range Status   Specimen Description BLOOD RIGHT ANTECUBITAL   Final   Special Requests BOTTLES DRAWN AEROBIC AND ANAEROBIC 8CC   Final   Culture NO GROWTH 1 DAY   Final   Report Status PENDING   Incomplete    RADIOLOGY STUDIES/RESULTS: Dg Chest 2 View  06/20/2013   CLINICAL DATA:  Cough, shortness of Breath  EXAM: CHEST  2 VIEW  COMPARISON:  03/16/2013 and 01/26/2013  FINDINGS: Cardiomediastinal silhouette is stable. No acute infiltrate or pleural effusion. Stable mild compression deformity  of T12 vertebral body.  IMPRESSION: No active cardiopulmonary disease.   Electronically Signed   By: Natasha Mead M.D.   On: 06/20/2013 11:37   Dg Chest Port 1 View  06/29/2013   CLINICAL DATA:  Shortness of breath and wheezing  EXAM: PORTABLE CHEST - 1 VIEW  COMPARISON:  June 20, 2013  FINDINGS: The heart size and mediastinal contours are within normal limits. There is right perihilar pneumonia. There is no pleural effusion. The visualized skeletal structures are stable.  IMPRESSION: Right perihilar pneumonia.   Electronically Signed   By: Sherian Rein M.D.   On: 06/29/2013 17:15    Jeoffrey Massed, MD  [facility] Pager:[phone]  If 7PM-7AM, please contact night-coverage [email or web address] Password [email or web address] 06/30/2013, 2:03 PM   LOS: 1 day

## 2013-06-30 NOTE — Progress Notes (Signed)
Utilization Review Complete  

## 2013-06-30 NOTE — Progress Notes (Signed)
Inpatient Diabetes Program Recommendations  AACE/ADA: New Consensus Statement on Inpatient Glycemic Control (2013)  Target Ranges:  Prepandial:   less than 140 mg/dL      Peak postprandial:   less than 180 mg/dL (1-2 hours)      Critically ill patients:  140 - 180 mg/dL   Results for BIRGIT, NOWLING (MRN [id number]) as of 06/30/2013 14:17  Ref. Range 06/30/2013 08:44 06/30/2013 11:11  Glucose-Capillary Latest Range: 70-99 mg/dL 409 (H) 811 (H)    Inpatient Diabetes Program Recommendations Insulin - Basal: Please consider ordering low dose basal insulin; recommend Lantus or Levemir 10 units Q24H (starting now). Insulin - Meal Coverage: May want to consider ordering Novolog meal coverage while on steroids if postprandial glucose consistently elevated greater than 180 mg/dl.  Note: According to the H&P, patient has a history of diabetes but she does not take any home medications for diabetes management.  Last A1C in the chart was 6.2% on 06/08/2012.  Initial lab glucose was noted to be 236 mg/dl on 91/4 @ 78:29.  Fasting glucose this morning was 253 mg/dl.  Currently patient is ordered Novolog 0-20 AC and Novolog 0-5 units HS for inpatient glycemic control.  Patient is ordered Solumedrol 40 mg Q8H which is contributing to hyperglycemia.  Please consider ordering low dose basal insulin; recommend Lantus or Levemir 10 units Q24H.  Also, may want to consider ordering Novolog meal coverage while on steroids if postprandial glucose is consistently greater than 180 mg/dl.  Will continue to follow.  Thanks, Orlando Penner, RN, MSN, CCRN Diabetes Coordinator Inpatient Diabetes Program [phone] (Team Pager) [phone] ([place] office) [phone] [facility] office)

## 2013-06-30 NOTE — Progress Notes (Signed)
ANTIBIOTIC CONSULT NOTE - INITIAL  Pharmacy Consult for Levaquin Indication: pneumonia  Allergies  Allergen Reactions  . Penicillins Shortness Of Breath       . Sulfa Antibiotics Shortness Of Breath  . Prednisone Other (See Comments)    Pancreatitis, but has to take for asthma sometimes    Patient Measurements: Height: 5\' 2"  (157.5 cm) Weight: 156 lb 8.4 oz (71 kg) IBW/kg (Calculated) : 50.1  Vital Signs: Temp: 98 F (36.7 C) (12/05 0428) Temp src: Oral (12/05 0428) BP: 124/76 mmHg (12/05 0428) Pulse Rate: 113 (12/05 0428) Intake/Output from previous day: 12/04 0701 - 12/05 0700 In: 840 [P.O.:840] Out: 250 [Urine:250] Intake/Output from this shift:    Labs:  Recent Labs  06/29/13 1700 06/30/13 0552  WBC 16.8* 9.3  HGB 13.8 14.3  PLT 266 250  CREATININE 0.60 0.59   Estimated Creatinine Clearance: 82.9 ml/min (by C-G formula based on Cr of 0.59). No results found for this basename: VANCOTROUGH, Leodis Binet, VANCORANDOM, GENTTROUGH, GENTPEAK, GENTRANDOM, TOBRATROUGH, TOBRAPEAK, TOBRARND, AMIKACINPEAK, AMIKACINTROU, AMIKACIN,  in the last 72 hours   Microbiology: Recent Results (from the past 720 hour(s))  CULTURE, BLOOD (ROUTINE X 2)     Status: None   Collection Time    06/29/13  5:07 PM      Result Value Range Status   Specimen Description BLOOD LEFT HAND   Final   Special Requests     Final   Value: BOTTLES DRAWN AEROBIC AND ANAEROBIC AEB=10CC ANA=8CC   Culture NO GROWTH 1 DAY   Final   Report Status PENDING   Incomplete  CULTURE, BLOOD (ROUTINE X 2)     Status: None   Collection Time    06/29/13  5:15 PM      Result Value Range Status   Specimen Description BLOOD RIGHT ANTECUBITAL   Final   Special Requests BOTTLES DRAWN AEROBIC AND ANAEROBIC 8CC   Final   Culture NO GROWTH 1 DAY   Final   Report Status PENDING   Incomplete    Medical History: Past Medical History  Diagnosis Date  . Asthma   . Depression   . Migraine   . Pancreatitis chronic  Idiopathic    Treated by Dr. Margaretha Glassing at [facility] in the past with celiac blocks.  . Bipolar 1 disorder   . Hyperlipidemia 02/03/2012  . Diabetes mellitus   . COPD (chronic obstructive pulmonary disease)   . Neck pain 06/08/2012  . Chronic abdominal pain   . Anemia   . Anxiety   . Hiatal hernia   . Esophagitis     Medications:  Scheduled:  . amitriptyline  50 mg Oral QHS  . amLODipine  10 mg Oral QHS  . docusate sodium  100 mg Oral QHS  . enoxaparin (LOVENOX) injection  40 mg Subcutaneous Q24H  . ferrous sulfate  325 mg Oral Daily  . gabapentin  600 mg Oral TID  . insulin aspart  0-20 Units Subcutaneous TID WC  . insulin aspart  0-5 Units Subcutaneous QHS  . ipratropium  0.5 mg Nebulization Q4H  . levalbuterol  0.63 mg Nebulization Q4H  . levofloxacin (LEVAQUIN) IV  750 mg Intravenous Q24H  . lipase/protease/amylase  3 capsule Oral TID WC  . LORazepam  0.5 mg Oral Q8H  . lubiprostone  24 mcg Oral BID WC  . methylPREDNISolone (SOLU-MEDROL) injection  60 mg Intravenous Q6H  . pantoprazole  40 mg Oral BID  . sodium chloride  3 mL Intravenous Q12H  Assessment: 42 yo F with worsening PNA started on Levaquin.   Renal function is normal.  No need for renal dose adjustments identified.   Goal of Therapy:  Eradicate infection.   Plan:  Continue Levaquin 750mg  q48h Duration of therapy per MD- anticipate 7 days  Change to po once appropriate Pharmacy to sign off.  Please re-consult as needed.   Elson Clan 06/30/2013,11:14 AM

## 2013-06-30 NOTE — Care Management Note (Signed)
    Page 1 of 1   06/30/2013     11:57:38 AM   CARE MANAGEMENT NOTE 06/30/2013  Patient:  [name], [name]   Account Number:  [id number]  Date Initiated:  06/30/2013  Documentation initiated by:  Rosemary Holms  Subjective/Objective Assessment:   Pt admitted from her parents home. She plans to return to her parents at DC. No HH or DME anticipated     Action/Plan:   Anticipated DC Date:  07/02/2013   Anticipated DC Plan:  HOME/SELF CARE      DC Planning Services  CM consult      Choice offered to / List presented to:             Status of service:  Completed, signed off Medicare Important Message given?   (If response is "NO", the following Medicare IM given date fields will be blank) Date Medicare IM given:   Date Additional Medicare IM given:    Discharge Disposition:    Per UR Regulation:    If discussed at Long Length of Stay Meetings, dates discussed:    Comments:  06/30/13 Rosemary Holms RN BSN CM

## 2013-07-01 DIAGNOSIS — J45901 Unspecified asthma with (acute) exacerbation: Secondary | ICD-10-CM | POA: Diagnosis not present

## 2013-07-01 DIAGNOSIS — I10 Essential (primary) hypertension: Secondary | ICD-10-CM

## 2013-07-01 DIAGNOSIS — J189 Pneumonia, unspecified organism: Secondary | ICD-10-CM | POA: Diagnosis not present

## 2013-07-01 DIAGNOSIS — J9600 Acute respiratory failure, unspecified whether with hypoxia or hypercapnia: Secondary | ICD-10-CM | POA: Diagnosis not present

## 2013-07-01 LAB — STREP PNEUMONIAE URINARY ANTIGEN: Strep Pneumo Urinary Antigen: NEGATIVE

## 2013-07-01 LAB — GLUCOSE, CAPILLARY: Glucose-Capillary: 224 mg/dL — ABNORMAL HIGH (ref 70–99)

## 2013-07-01 LAB — LEGIONELLA ANTIGEN, URINE: Legionella Antigen, Urine: NEGATIVE

## 2013-07-01 MED ORDER — FLUTICASONE-SALMETEROL 250-50 MCG/DOSE IN AEPB: 1.0000 | INHALATION_SPRAY | Freq: Two times a day (BID) | RESPIRATORY_TRACT | Status: DC

## 2013-07-01 MED ORDER — PREDNISONE 10 MG PO TABS: ORAL_TABLET | ORAL | Status: DC

## 2013-07-01 MED ORDER — LEVOFLOXACIN 750 MG PO TABS: 750.0000 mg | ORAL_TABLET | Freq: Every day | ORAL | Status: DC

## 2013-07-01 MED ORDER — PREDNISONE 20 MG PO TABS: 40.0000 mg | ORAL_TABLET | Freq: Every day | ORAL | Status: DC

## 2013-07-01 MED ORDER — IPRATROPIUM-ALBUTEROL 0.5-2.5 (3) MG/3ML IN SOLN: 3.0000 mL | Freq: Four times a day (QID) | RESPIRATORY_TRACT | Status: DC | PRN

## 2013-07-01 MED ORDER — METFORMIN HCL 500 MG PO TABS: 500.0000 mg | ORAL_TABLET | Freq: Two times a day (BID) | ORAL | Status: DC

## 2013-07-01 NOTE — Progress Notes (Signed)
IV removed, site WNL.  Pt given d/c instructions and new prescriptions.  Discussed home care with patient and discussed home medications, patient verbalizes understanding, teachback completed. F/U appointment to be made by pt when office is open, pt states she will make and keep appointment. Pt is stable at this time. Pt taken to main entrance in wheelchair by staff member.

## 2013-07-01 NOTE — Progress Notes (Signed)
PATIENT DETAILS Name: [name] Age: 42 y.o. Sex: female Date of Birth: 08/18/1970 Admit Date: 06/29/2013 Admitting Physician Osvaldo Shipper, MD ZOX:[id number] J., MD  Subjective: Significantly better. Lungs clear to exam. Per RN, patient somewhat anxious without oxygen via nasal cannula-this morning she is just on nasal cannula but with no oxygen running- O2 saturations are in the mid 90s (this is in room air). Patient feels better as well.  Assessment/Plan: Principal Problem:   CAP (community acquired pneumonia) - Patient was admitted, started on intravenous Levaquin, she improved rather quickly. Throughout the hospital stay, she was afebrile, and CBC showed no leukocytosis. - We will Continue with levofloxacin- currently day 3- on discharge.  Active Problems: Asthma with acute exacerbation` - Patient on admission, was found to be wheezing and mildly hypoxic. She was admitted, started on IV Solu-Medrol, and nebulized bronchodilators. This morning, significantly better, O2 saturations on room air in the mid 90s. Will stop Solu-Medrol, start prednisone, plan for a week long taper. Will provide patient with nebulized bronchodilators, she probably would also need to be on long-acting steroids. Please note, lungs are completely clear on exam this morning.  Acute hypoxic respiratory failure - Secondary to above 2 conditions. Resolved. - Treated with oxygen, steroids and nebulized bronchodilators. O2 saturations in the mid-90s this morning. A  Hypertension - Stable, continue with amlodipine  GERD -Continue with Protonix  History of idiopathic pancreatitis - Asymptomatic, without any abdominal pain. - Continue pancreatic enzymes    Bipolar disorder - Currently stable - Continue with current medications  Hyperglycemia - Because of steroids, she developed hyperglycemia during the hospital stay, she was placed on low-dose Lantus and sliding scale insulin. A1c is 7.0.  On discharge she'll be discharged on her regular home medications. Anticipate hyperglycemia to get better once she is off steroids.  Disposition: Discharge home later today  DVT Prophylaxis: Prophylactic Lovenox  Code Status: Full code   Family Communication None at bedside  Procedures:  None  CONSULTS:  None   MEDICATIONS: Scheduled Meds: . amitriptyline  50 mg Oral QHS  . amLODipine  10 mg Oral QHS  . docusate sodium  100 mg Oral QHS  . enoxaparin (LOVENOX) injection  40 mg Subcutaneous Q24H  . ferrous sulfate  325 mg Oral Daily  . gabapentin  600 mg Oral TID  . insulin aspart  0-20 Units Subcutaneous TID WC  . insulin aspart  0-5 Units Subcutaneous QHS  . insulin glargine  8 Units Subcutaneous QHS  . ipratropium  0.5 mg Nebulization Q4H  . levalbuterol  0.63 mg Nebulization Q4H  . levofloxacin (LEVAQUIN) IV  750 mg Intravenous Q24H  . lipase/protease/amylase  3 capsule Oral TID WC  . LORazepam  0.5 mg Oral Q8H  . lubiprostone  24 mcg Oral BID WC  . methylPREDNISolone (SOLU-MEDROL) injection  40 mg Intravenous Q8H  . pantoprazole  40 mg Oral BID  . sodium chloride  3 mL Intravenous Q12H   Continuous Infusions:  PRN Meds:.acetaminophen, acetaminophen, dicyclomine, HYDROcodone-acetaminophen, HYDROmorphone (DILAUDID) injection, hydrOXYzine, levalbuterol, lipase/protease/amylase, ondansetron (ZOFRAN) IV, ondansetron, polyethylene glycol  Antibiotics: Anti-infectives   Start     Dose/Rate Route Frequency Ordered Stop   06/30/13 1800  levofloxacin (LEVAQUIN) IVPB 750 mg     750 mg 100 mL/hr over 90 Minutes Intravenous Every 24 hours 06/29/13 2111 07/05/13 1759   06/29/13 1815  levofloxacin (LEVAQUIN) IVPB 750 mg     750 mg 100 mL/hr over 90 Minutes Intravenous  Once 06/29/13 1804 06/29/13  2000       PHYSICAL EXAM: Vital signs in last 24 hours: Filed Vitals:   06/30/13 2308 07/01/13 0009 07/01/13 0506 07/01/13 0848  BP: 105/73  112/74   Pulse: 101  99    Temp: 97.9 F (36.6 C)  97.8 F (36.6 C)   TempSrc: Oral  Oral   Resp: 16  16   Height:      Weight:      SpO2: 95% 93% 93% 94%    Weight change:  Filed Weights   06/29/13 2103  Weight: 71 kg (156 lb 8.4 oz)   Body mass index is 28.62 kg/(m^2).   Gen Exam: Awake and alert with clear speech.   Neck: Supple, No JVD.   Chest: Good air entry bilaterally, no rhonchi heard.    CVS: S1 S2 Regular, no murmurs.  Abdomen: soft, BS +, non tender, non distended. Extremities: no edema, lower extremities warm to touch. Neurologic: Non Focal.   Skin: No Rash.   Wounds: N/A.    Intake/Output from previous day:  Intake/Output Summary (Last 24 hours) at 07/01/13 1018 Last data filed at 07/01/13 0532  Gross per 24 hour  Intake      0 ml  Output   2900 ml  Net  -2900 ml     LAB RESULTS: CBC  Recent Labs Lab 06/29/13 1700 06/30/13 0552  WBC 16.8* 9.3  HGB 13.8 14.3  HCT 39.9 41.6  PLT 266 250  MCV 88.5 89.8  MCH 30.6 30.9  MCHC 34.6 34.4  RDW 12.2 12.2  LYMPHSABS 2.3  --   MONOABS 1.1*  --   EOSABS 1.1*  --   BASOSABS 0.1  --     Chemistries   Recent Labs Lab 06/29/13 1700 06/30/13 0552  NA 133* 135  K 3.4* 4.4  CL 95* 100  CO2 25 26  GLUCOSE 236* 278*  BUN 9 7  CREATININE 0.60 0.59  CALCIUM 9.6 9.6    CBG:  Recent Labs Lab 06/30/13 1111 06/30/13 1708 06/30/13 2103 07/01/13 0448 07/01/13 0742  GLUCAP 315* 337* 189* 224* 210*    GFR Estimated Creatinine Clearance: 82.9 ml/min (by C-G formula based on Cr of 0.59).  Coagulation profile No results found for this basename: INR, PROTIME,  in the last 168 hours  Cardiac Enzymes  Recent Labs Lab 06/29/13 1700  TROPONINI <0.30    No components found with this basename: POCBNP,  No results found for this basename: DDIMER,  in the last 72 hours  Recent Labs  06/30/13 0552  HGBA1C 7.0*   No results found for this basename: CHOL, HDL, LDLCALC, TRIG, CHOLHDL, LDLDIRECT,  in the last 72  hours  Recent Labs  06/30/13 0552  TSH 0.666   No results found for this basename: VITAMINB12, FOLATE, FERRITIN, TIBC, IRON, RETICCTPCT,  in the last 72 hours No results found for this basename: LIPASE, AMYLASE,  in the last 72 hours  Urine Studies No results found for this basename: UACOL, UAPR, USPG, UPH, UTP, UGL, UKET, UBIL, UHGB, UNIT, UROB, ULEU, UEPI, UWBC, URBC, UBAC, CAST, CRYS, UCOM, BILUA,  in the last 72 hours  MICROBIOLOGY: Recent Results (from the past 240 hour(s))  CULTURE, BLOOD (ROUTINE X 2)     Status: None   Collection Time    06/29/13  5:07 PM      Result Value Range Status   Specimen Description BLOOD LEFT HAND   Final   Special Requests     Final  Value: BOTTLES DRAWN AEROBIC AND ANAEROBIC AEB=10CC ANA=8CC   Culture NO GROWTH 2 DAYS   Final   Report Status PENDING   Incomplete  CULTURE, BLOOD (ROUTINE X 2)     Status: None   Collection Time    06/29/13  5:15 PM      Result Value Range Status   Specimen Description BLOOD RIGHT ANTECUBITAL   Final   Special Requests BOTTLES DRAWN AEROBIC AND ANAEROBIC 8CC   Final   Culture NO GROWTH 2 DAYS   Final   Report Status PENDING   Incomplete    RADIOLOGY STUDIES/RESULTS: Dg Chest 2 View  06/20/2013   CLINICAL DATA:  Cough, shortness of Breath  EXAM: CHEST  2 VIEW  COMPARISON:  03/16/2013 and 01/26/2013  FINDINGS: Cardiomediastinal silhouette is stable. No acute infiltrate or pleural effusion. Stable mild compression deformity of T12 vertebral body.  IMPRESSION: No active cardiopulmonary disease.   Electronically Signed   By: Natasha Mead M.D.   On: 06/20/2013 11:37   Dg Chest Port 1 View  06/29/2013   CLINICAL DATA:  Shortness of breath and wheezing  EXAM: PORTABLE CHEST - 1 VIEW  COMPARISON:  June 20, 2013  FINDINGS: The heart size and mediastinal contours are within normal limits. There is right perihilar pneumonia. There is no pleural effusion. The visualized skeletal structures are stable.  IMPRESSION: Right  perihilar pneumonia.   Electronically Signed   By: Sherian Rein M.D.   On: 06/29/2013 17:15    Jeoffrey Massed, MD  [facility] Pager:[phone]  If 7PM-7AM, please contact night-coverage [email or web address] Password [email or web address] 07/01/2013, 10:18 AM   LOS: 2 days

## 2013-07-01 NOTE — Discharge Summary (Signed)
PATIENT DETAILS Name: [name] Age: 42 y.o. Sex: female Date of Birth: 02-Jun-1971 MRN: [id number]. Admit Date: 06/29/2013 Admitting Physician: Osvaldo Shipper, MD WUJ:WJXBJ,YNWGNFAO J., MD  Recommendations for Outpatient Follow-up:  1. Blood cultures negative at the time of discharge, please follow to final. 2. Patient hemoglobin A1c is 7.0, started on metformin. Please optimize.  PRIMARY DISCHARGE DIAGNOSIS:  Principal Problem:   CAP (community acquired pneumonia) Active Problems:   Bronchial asthma with exacerbation   Bipolar disorder   MIGRAINE HEADACHE   HYPERTENSION   Constipation   Tobacco abuse   Acute bronchitis   Diabetes      PAST MEDICAL HISTORY: Past Medical History  Diagnosis Date  . Asthma   . Depression   . Migraine   . Pancreatitis chronic Idiopathic    Treated by Dr. Margaretha Glassing at [facility] in the past with celiac blocks.  . Bipolar 1 disorder   . Hyperlipidemia 02/03/2012  . Diabetes mellitus   . COPD (chronic obstructive pulmonary disease)   . Neck pain 06/08/2012  . Chronic abdominal pain   . Anemia   . Anxiety   . Hiatal hernia   . Esophagitis     DISCHARGE MEDICATIONS:   Medication List         albuterol 108 (90 BASE) MCG/ACT inhaler  Commonly known as:  PROVENTIL HFA;VENTOLIN HFA  Inhale 2 puffs into the lungs daily as needed. For shortness of breath     amitriptyline 50 MG tablet  Commonly known as:  ELAVIL  Take 50 mg by mouth at bedtime.     amLODipine 10 MG tablet  Commonly known as:  NORVASC  Take 10 mg by mouth at bedtime.     BENTYL 10 MG capsule  Generic drug:  dicyclomine  Take 10 mg by mouth 3 (three) times daily as needed. Take 1 capsule (10 mg total) by mouth 3 (three) times daily as needed (abdominal cramps).     docusate sodium 100 MG capsule  Commonly known as:  COLACE  Take 100 mg by mouth at bedtime.     ferrous sulfate 325 (65 FE) MG tablet  Take 325 mg by mouth daily.     Fluticasone-Salmeterol 250-50 MCG/DOSE Aepb  Commonly known as:  ADVAIR DISKUS  Inhale 1 puff into the lungs 2 (two) times daily.     gabapentin 300 MG capsule  Commonly known as:  NEURONTIN  Take 600 mg by mouth 3 (three) times daily.     HYDROcodone-acetaminophen 10-325 MG per tablet  Commonly known as:  NORCO  Take 1 tablet by mouth every 6 (six) hours as needed for pain.     hydrOXYzine 25 MG tablet  Commonly known as:  ATARAX/VISTARIL  Take 1 tablet (25 mg total) by mouth every 6 (six) hours as needed for anxiety.     ipratropium-albuterol 0.5-2.5 (3) MG/3ML Soln  Commonly known as:  DUONEB  Inhale 3 mLs into the lungs every 6 (six) hours as needed. Take 3 mLs by nebulization every 6 (six) hours as needed for Wheezing.     levofloxacin 750 MG tablet  Commonly known as:  LEVAQUIN  Take 1 tablet (750 mg total) by mouth daily.     LORazepam 0.5 MG tablet  Commonly known as:  ATIVAN  Take 0.5 mg by mouth every 8 (eight) hours.     lubiprostone 24 MCG capsule  Commonly known as:  AMITIZA  Take 24 mcg by mouth 2 (two) times daily with a meal.  metFORMIN 500 MG tablet  Commonly known as:  GLUCOPHAGE  Take 1 tablet (500 mg total) by mouth 2 (two) times daily with a meal. Please take daily for the first 2-3 days, then take twice a day.     ondansetron 4 MG tablet  Commonly known as:  ZOFRAN  Take 4 mg by mouth every 8 (eight) hours.     Pancrelipase (Lip-Prot-Amyl) 20000 UNITS Cpep  Commonly known as:  ZENPEP  Take 3 capsules (60,000 Units total) by mouth 3 (three) times daily with meals. 2 with snacks     pantoprazole 40 MG tablet  Commonly known as:  PROTONIX  Take 40 mg by mouth 2 (two) times daily.     polyethylene glycol powder powder  Commonly known as:  GLYCOLAX/MIRALAX  Take 17 g by mouth daily as needed. for Constipation.     predniSONE 10 MG tablet  Commonly known as:  DELTASONE  - Take 40 mg (4 tablets) daily for 2 days, then,  - Take 30 mg (3  tablets) daily for 2 days, then,  - Take 20 mg (2 tablets) daily for one day, then,  - Take 10 mg (1 tablet) daily for one day and then stop     promethazine 25 MG tablet  Commonly known as:  PHENERGAN  Take 25 mg by mouth every 6 (six) hours as needed for nausea or vomiting.        ALLERGIES:   Allergies  Allergen Reactions  . Penicillins Shortness Of Breath       . Sulfa Antibiotics Shortness Of Breath  . Prednisone Other (See Comments)    Pancreatitis, but has to take for asthma sometimes    BRIEF HPI:  See H&P, Labs, Consult and Test reports for all details in brief,[name] is a 42 y.o. female with a past medical history of asthma, idiopathic pancreatitis, bipolar disorder, hypertension, chronic back pain, and migraine headaches, who was in her usual state of health about 9 days prior to admission., when she started having cough with shortness of breath. He was seen a few days prior to admission in the emergency department, given steroids, Levaquin and inhaler. However his symptoms continued to worsen, as a result she presented to the hospital, and was admitted for further evaluation and treatment.  CONSULTATIONS:   None  PERTINENT RADIOLOGIC STUDIES: Dg Chest 2 View  06/20/2013   CLINICAL DATA:  Cough, shortness of Breath  EXAM: CHEST  2 VIEW  COMPARISON:  03/16/2013 and 01/26/2013  FINDINGS: Cardiomediastinal silhouette is stable. No acute infiltrate or pleural effusion. Stable mild compression deformity of T12 vertebral body.  IMPRESSION: No active cardiopulmonary disease.   Electronically Signed   By: Natasha Mead M.D.   On: 06/20/2013 11:37   Dg Chest Port 1 View  06/29/2013   CLINICAL DATA:  Shortness of breath and wheezing  EXAM: PORTABLE CHEST - 1 VIEW  COMPARISON:  June 20, 2013  FINDINGS: The heart size and mediastinal contours are within normal limits. There is right perihilar pneumonia. There is no pleural effusion. The visualized skeletal structures  are stable.  IMPRESSION: Right perihilar pneumonia.   Electronically Signed   By: Sherian Rein M.D.   On: 06/29/2013 17:15     PERTINENT LAB RESULTS: CBC:  Recent Labs  06/29/13 1700 06/30/13 0552  WBC 16.8* 9.3  HGB 13.8 14.3  HCT 39.9 41.6  PLT 266 250   CMET CMP     Component Value Date/Time  NA 135 06/30/2013 0552   K 4.4 06/30/2013 0552   CL 100 06/30/2013 0552   CO2 26 06/30/2013 0552   GLUCOSE 278* 06/30/2013 0552   BUN 7 06/30/2013 0552   CREATININE 0.59 06/30/2013 0552   CREATININE 0.60 11/22/2012 1521   CALCIUM 9.6 06/30/2013 0552   PROT 6.8 06/30/2013 0552   ALBUMIN 3.4* 06/30/2013 0552   AST 14 06/30/2013 0552   ALT 17 06/30/2013 0552   ALKPHOS 91 06/30/2013 0552   BILITOT 0.4 06/30/2013 0552   GFRNONAA >90 06/30/2013 0552   GFRAA >90 06/30/2013 0552    GFR Estimated Creatinine Clearance: 82.9 ml/min (by C-G formula based on Cr of 0.59). No results found for this basename: LIPASE, AMYLASE,  in the last 72 hours  Recent Labs  06/29/13 1700  TROPONINI <0.30   No components found with this basename: POCBNP,  No results found for this basename: DDIMER,  in the last 72 hours  Recent Labs  06/30/13 0552  HGBA1C 7.0*   No results found for this basename: CHOL, HDL, LDLCALC, TRIG, CHOLHDL, LDLDIRECT,  in the last 72 hours  Recent Labs  06/30/13 0552  TSH 0.666   No results found for this basename: VITAMINB12, FOLATE, FERRITIN, TIBC, IRON, RETICCTPCT,  in the last 72 hours Coags: No results found for this basename: PT, INR,  in the last 72 hours Microbiology: Recent Results (from the past 240 hour(s))  CULTURE, BLOOD (ROUTINE X 2)     Status: None   Collection Time    06/29/13  5:07 PM      Result Value Range Status   Specimen Description BLOOD LEFT HAND   Final   Special Requests     Final   Value: BOTTLES DRAWN AEROBIC AND ANAEROBIC AEB=10CC ANA=8CC   Culture NO GROWTH 2 DAYS   Final   Report Status PENDING   Incomplete  CULTURE, BLOOD (ROUTINE X 2)      Status: None   Collection Time    06/29/13  5:15 PM      Result Value Range Status   Specimen Description BLOOD RIGHT ANTECUBITAL   Final   Special Requests BOTTLES DRAWN AEROBIC AND ANAEROBIC 8CC   Final   Culture NO GROWTH 2 DAYS   Final   Report Status PENDING   Incomplete     BRIEF HOSPITAL COURSE:   Principal Problem:   CAP (community acquired pneumonia)  - Patient was admitted, started on intravenous Levaquin, she improved rather quickly. Throughout the hospital stay, she was afebrile, and CBC showed no leukocytosis.  - We will Continue with levofloxacin- currently day 3- on discharge, we'll continue with levofloxacin, and plan for another 5 more days of therapy.  Active Problems:  Asthma with acute exacerbation`  - Patient on admission, was found to be wheezing and mildly hypoxic. She was admitted, started on IV Solu-Medrol, and nebulized bronchodilators. This morning, significantly better, O2 saturations on room air in the mid 90s. Will stop Solu-Medrol, start prednisone, plan for a 5-6 day taper. Will provide patient with nebulized bronchodilators, she probably would also need to be on long-acting inhaled steroids. Please note, lungs are completely clear on exam this morning. - Please note, patient has tolerated Solu-Medrol well here, she is also tolerated prednisone well in the past without any issues, she claims that approximately 6 months ago she was placed on tapering prednisone without triggering her pancreatitis. I'm not sure whether this is a true allergy or true trigger for pancreatitis.  Acute hypoxic respiratory failure  - Secondary to above 2 conditions. Resolved.  - Treated with oxygen, steroids and nebulized bronchodilators. O2 saturations in the mid-90s this morning. A   Hypertension  - Stable, continue with amlodipine   GERD  -Continue with Protonix   History of idiopathic pancreatitis  - Asymptomatic, without any abdominal pain.  - Continue pancreatic  enzymes   Bipolar disorder  - Currently stable  - Continue with preadmission medications on discharge.  Hyperglycemia  - Because of steroids, she developed hyperglycemia during the hospital stay, she was placed on low-dose Lantus and sliding scale insulin. A1c is 7.0. On discharge she'll be discharged on a metformin. Anticipate hyperglycemia to get better once she is off steroids.  -Interestingly, patient was not on any hypoglycemic agents prior to this admission. I had a long discussion with the patient, told her her hemoglobin A1c was 7.0, offered metformin therapy, she elected to start metformin. I have asked her to take metformin 500 mg daily for the first 2-3 days, if she tolerates it well, then start taking it twice a day. She will followup with her primary care practitioner for further optimization.  TODAY-DAY OF DISCHARGE:  Subjective:   [name] today has no headache,no chest abdominal pain,no new weakness tingling or numbness, feels much better wants to go home today.  Objective:   Blood pressure 112/74, pulse 99, temperature 97.8 F (36.6 C), temperature source Oral, resp. rate 16, height 5\' 2"  (1.575 m), weight 71 kg (156 lb 8.4 oz), SpO2 90.00%.  Intake/Output Summary (Last 24 hours) at 07/01/13 1044 Last data filed at 07/01/13 0532  Gross per 24 hour  Intake      0 ml  Output   2900 ml  Net  -2900 ml   Filed Weights   06/29/13 2103  Weight: 71 kg (156 lb 8.4 oz)    Exam Awake Alert, Oriented *3, No new F.N deficits, Normal affect [place].AT,PERRAL Supple Neck,No JVD, No cervical lymphadenopathy appriciated.  Symmetrical Chest wall movement, Good air movement bilaterally, CTAB RRR,No Gallops,Rubs or new Murmurs, No Parasternal Heave +ve B.Sounds, Abd Soft, Non tender, No organomegaly appriciated, No rebound -guarding or rigidity. No Cyanosis, Clubbing or edema, No new Rash or bruise  DISCHARGE CONDITION: Stable  DISPOSITION: Home  DISCHARGE INSTRUCTIONS:     Activity:  As tolerated   Diet recommendation: Diabetic Diet Heart Healthy diet  Discharge Orders   Future Orders Complete By Expires   Call MD for:  difficulty breathing, headache or visual disturbances  As directed    Diet - low sodium heart healthy  As directed    Increase activity slowly  As directed       Follow-up Information   Follow up with Cassell Smiles., MD. Schedule an appointment as soon as possible for a visit in 5 days.   Specialty:  Internal Medicine   Contact information:   [address] [place]  [address] [phone]      Total Time spent on discharge equals 45 minutes.  SignedJeoffrey Massed 07/01/2013 10:44 AM

## 2013-07-04 LAB — CULTURE, BLOOD (ROUTINE X 2)
Culture: NO GROWTH
Culture: NO GROWTH

## 2013-07-11 DIAGNOSIS — Z6828 Body mass index (BMI) 28.0-28.9, adult: Secondary | ICD-10-CM | POA: Diagnosis not present

## 2013-07-11 DIAGNOSIS — J189 Pneumonia, unspecified organism: Secondary | ICD-10-CM | POA: Diagnosis not present

## 2013-07-11 DIAGNOSIS — G8929 Other chronic pain: Secondary | ICD-10-CM | POA: Diagnosis not present

## 2013-07-28 ENCOUNTER — Other Ambulatory Visit: Payer: Self-pay | Admitting: Gastroenterology

## 2013-07-28 DIAGNOSIS — E039 Hypothyroidism, unspecified: Secondary | ICD-10-CM | POA: Diagnosis not present

## 2013-07-28 DIAGNOSIS — G8929 Other chronic pain: Secondary | ICD-10-CM | POA: Diagnosis not present

## 2013-07-28 DIAGNOSIS — Z6828 Body mass index (BMI) 28.0-28.9, adult: Secondary | ICD-10-CM | POA: Diagnosis not present

## 2013-07-28 DIAGNOSIS — G43909 Migraine, unspecified, not intractable, without status migrainosus: Secondary | ICD-10-CM | POA: Diagnosis not present

## 2013-08-21 DIAGNOSIS — M542 Cervicalgia: Secondary | ICD-10-CM | POA: Diagnosis not present

## 2013-08-21 DIAGNOSIS — M47812 Spondylosis without myelopathy or radiculopathy, cervical region: Secondary | ICD-10-CM | POA: Diagnosis not present

## 2013-08-21 DIAGNOSIS — M549 Dorsalgia, unspecified: Secondary | ICD-10-CM | POA: Diagnosis not present

## 2013-08-21 DIAGNOSIS — M47814 Spondylosis without myelopathy or radiculopathy, thoracic region: Secondary | ICD-10-CM | POA: Diagnosis not present

## 2013-08-22 DIAGNOSIS — Z6828 Body mass index (BMI) 28.0-28.9, adult: Secondary | ICD-10-CM | POA: Diagnosis not present

## 2013-08-22 DIAGNOSIS — G8929 Other chronic pain: Secondary | ICD-10-CM | POA: Diagnosis not present

## 2013-09-06 DIAGNOSIS — M542 Cervicalgia: Secondary | ICD-10-CM | POA: Diagnosis not present

## 2013-09-06 DIAGNOSIS — M546 Pain in thoracic spine: Secondary | ICD-10-CM | POA: Diagnosis not present

## 2013-09-06 DIAGNOSIS — IMO0001 Reserved for inherently not codable concepts without codable children: Secondary | ICD-10-CM | POA: Diagnosis not present

## 2013-09-07 DIAGNOSIS — IMO0001 Reserved for inherently not codable concepts without codable children: Secondary | ICD-10-CM | POA: Diagnosis not present

## 2013-09-07 DIAGNOSIS — M542 Cervicalgia: Secondary | ICD-10-CM | POA: Diagnosis not present

## 2013-09-07 DIAGNOSIS — R748 Abnormal levels of other serum enzymes: Secondary | ICD-10-CM | POA: Diagnosis not present

## 2013-09-07 DIAGNOSIS — M546 Pain in thoracic spine: Secondary | ICD-10-CM | POA: Diagnosis not present

## 2013-09-14 DIAGNOSIS — M546 Pain in thoracic spine: Secondary | ICD-10-CM | POA: Diagnosis not present

## 2013-09-14 DIAGNOSIS — M542 Cervicalgia: Secondary | ICD-10-CM | POA: Diagnosis not present

## 2013-09-14 DIAGNOSIS — IMO0001 Reserved for inherently not codable concepts without codable children: Secondary | ICD-10-CM | POA: Diagnosis not present

## 2013-09-18 DIAGNOSIS — Z6828 Body mass index (BMI) 28.0-28.9, adult: Secondary | ICD-10-CM | POA: Diagnosis not present

## 2013-09-18 DIAGNOSIS — E119 Type 2 diabetes mellitus without complications: Secondary | ICD-10-CM | POA: Diagnosis not present

## 2013-09-18 DIAGNOSIS — E039 Hypothyroidism, unspecified: Secondary | ICD-10-CM | POA: Diagnosis not present

## 2013-09-18 DIAGNOSIS — G8929 Other chronic pain: Secondary | ICD-10-CM | POA: Diagnosis not present

## 2013-09-19 DIAGNOSIS — M546 Pain in thoracic spine: Secondary | ICD-10-CM | POA: Diagnosis not present

## 2013-09-19 DIAGNOSIS — M542 Cervicalgia: Secondary | ICD-10-CM | POA: Diagnosis not present

## 2013-09-19 DIAGNOSIS — IMO0001 Reserved for inherently not codable concepts without codable children: Secondary | ICD-10-CM | POA: Diagnosis not present

## 2013-09-27 ENCOUNTER — Other Ambulatory Visit: Payer: Self-pay | Admitting: Gastroenterology

## 2013-09-28 DIAGNOSIS — M542 Cervicalgia: Secondary | ICD-10-CM | POA: Diagnosis not present

## 2013-09-28 DIAGNOSIS — IMO0001 Reserved for inherently not codable concepts without codable children: Secondary | ICD-10-CM | POA: Diagnosis not present

## 2013-10-03 DIAGNOSIS — G894 Chronic pain syndrome: Secondary | ICD-10-CM | POA: Diagnosis not present

## 2013-10-03 DIAGNOSIS — M47814 Spondylosis without myelopathy or radiculopathy, thoracic region: Secondary | ICD-10-CM | POA: Diagnosis not present

## 2013-10-03 DIAGNOSIS — M545 Low back pain, unspecified: Secondary | ICD-10-CM | POA: Diagnosis not present

## 2013-10-03 DIAGNOSIS — M5030 Other cervical disc degeneration, unspecified cervical region: Secondary | ICD-10-CM | POA: Diagnosis not present

## 2013-10-16 DIAGNOSIS — G8929 Other chronic pain: Secondary | ICD-10-CM | POA: Diagnosis not present

## 2013-10-16 DIAGNOSIS — Z6828 Body mass index (BMI) 28.0-28.9, adult: Secondary | ICD-10-CM | POA: Diagnosis not present

## 2013-10-17 DIAGNOSIS — M546 Pain in thoracic spine: Secondary | ICD-10-CM | POA: Diagnosis not present

## 2013-10-17 DIAGNOSIS — M47814 Spondylosis without myelopathy or radiculopathy, thoracic region: Secondary | ICD-10-CM | POA: Diagnosis not present

## 2013-10-17 DIAGNOSIS — G894 Chronic pain syndrome: Secondary | ICD-10-CM | POA: Diagnosis not present

## 2013-10-17 DIAGNOSIS — IMO0002 Reserved for concepts with insufficient information to code with codable children: Secondary | ICD-10-CM | POA: Diagnosis not present

## 2013-11-07 DIAGNOSIS — G894 Chronic pain syndrome: Secondary | ICD-10-CM | POA: Diagnosis not present

## 2013-11-07 DIAGNOSIS — M5030 Other cervical disc degeneration, unspecified cervical region: Secondary | ICD-10-CM | POA: Diagnosis not present

## 2013-11-07 DIAGNOSIS — M47814 Spondylosis without myelopathy or radiculopathy, thoracic region: Secondary | ICD-10-CM | POA: Diagnosis not present

## 2013-11-08 ENCOUNTER — Telehealth: Payer: Self-pay | Admitting: Internal Medicine

## 2013-11-08 ENCOUNTER — Emergency Department (HOSPITAL_COMMUNITY)
Admission: EM | Admit: 2013-11-08 | Discharge: 2013-11-08 | Discharge status: Against Medical Advice | Payer: Medicare Other

## 2013-11-08 DIAGNOSIS — K319 Disease of stomach and duodenum, unspecified: Secondary | ICD-10-CM | POA: Diagnosis not present

## 2013-11-08 DIAGNOSIS — Z79899 Other long term (current) drug therapy: Secondary | ICD-10-CM | POA: Diagnosis not present

## 2013-11-08 DIAGNOSIS — K219 Gastro-esophageal reflux disease without esophagitis: Secondary | ICD-10-CM | POA: Diagnosis not present

## 2013-11-08 DIAGNOSIS — I10 Essential (primary) hypertension: Secondary | ICD-10-CM | POA: Diagnosis not present

## 2013-11-08 DIAGNOSIS — F3289 Other specified depressive episodes: Secondary | ICD-10-CM | POA: Diagnosis not present

## 2013-11-08 DIAGNOSIS — F17200 Nicotine dependence, unspecified, uncomplicated: Secondary | ICD-10-CM | POA: Diagnosis not present

## 2013-11-08 DIAGNOSIS — J45909 Unspecified asthma, uncomplicated: Secondary | ICD-10-CM | POA: Diagnosis not present

## 2013-11-08 DIAGNOSIS — R1013 Epigastric pain: Secondary | ICD-10-CM | POA: Diagnosis not present

## 2013-11-08 DIAGNOSIS — K279 Peptic ulcer, site unspecified, unspecified as acute or chronic, without hemorrhage or perforation: Secondary | ICD-10-CM | POA: Diagnosis not present

## 2013-11-08 DIAGNOSIS — F329 Major depressive disorder, single episode, unspecified: Secondary | ICD-10-CM | POA: Diagnosis not present

## 2013-11-08 DIAGNOSIS — F411 Generalized anxiety disorder: Secondary | ICD-10-CM | POA: Diagnosis not present

## 2013-11-08 NOTE — Telephone Encounter (Signed)
Pt called to make OV and is aware on 4/29 with extender, but she would like to speak to nurse as well. She is having abd pain and has pancreatitis. [phone]

## 2013-11-09 NOTE — Telephone Encounter (Signed)
Tried to call pt- NA 

## 2013-11-13 DIAGNOSIS — Z6829 Body mass index (BMI) 29.0-29.9, adult: Secondary | ICD-10-CM | POA: Diagnosis not present

## 2013-11-13 DIAGNOSIS — J0100 Acute maxillary sinusitis, unspecified: Secondary | ICD-10-CM | POA: Diagnosis not present

## 2013-11-13 DIAGNOSIS — H6690 Otitis media, unspecified, unspecified ear: Secondary | ICD-10-CM | POA: Diagnosis not present

## 2013-11-13 DIAGNOSIS — G8929 Other chronic pain: Secondary | ICD-10-CM | POA: Diagnosis not present

## 2013-11-13 DIAGNOSIS — H7290 Unspecified perforation of tympanic membrane, unspecified ear: Secondary | ICD-10-CM | POA: Diagnosis not present

## 2013-11-13 NOTE — Telephone Encounter (Signed)
Spoke with pt- informed her that I had tried to get in touch with her but was unable to reach her to get more information about what was going on. She said she was having some abd pain and at first she thought it was her pancreatitis but pt is doing better now. She said she also has a cyst on her ovaries and is going to the GYN this week. She is aware of her ov with [place] on 4/29.

## 2013-11-15 DIAGNOSIS — N949 Unspecified condition associated with female genital organs and menstrual cycle: Secondary | ICD-10-CM | POA: Diagnosis not present

## 2013-11-15 DIAGNOSIS — Z1272 Encounter for screening for malignant neoplasm of vagina: Secondary | ICD-10-CM | POA: Diagnosis not present

## 2013-11-15 DIAGNOSIS — Z01419 Encounter for gynecological examination (general) (routine) without abnormal findings: Secondary | ICD-10-CM | POA: Diagnosis not present

## 2013-11-15 DIAGNOSIS — Z90710 Acquired absence of both cervix and uterus: Secondary | ICD-10-CM | POA: Diagnosis not present

## 2013-11-16 ENCOUNTER — Encounter: Payer: Self-pay | Admitting: Adult Health

## 2013-11-20 DIAGNOSIS — N949 Unspecified condition associated with female genital organs and menstrual cycle: Secondary | ICD-10-CM | POA: Diagnosis not present

## 2013-11-20 DIAGNOSIS — Z90710 Acquired absence of both cervix and uterus: Secondary | ICD-10-CM | POA: Diagnosis not present

## 2013-11-22 ENCOUNTER — Ambulatory Visit: Payer: Medicare Other | Admitting: Gastroenterology

## 2013-11-23 DIAGNOSIS — K589 Irritable bowel syndrome without diarrhea: Secondary | ICD-10-CM | POA: Diagnosis not present

## 2013-11-23 DIAGNOSIS — N949 Unspecified condition associated with female genital organs and menstrual cycle: Secondary | ICD-10-CM | POA: Diagnosis not present

## 2013-12-11 DIAGNOSIS — G8929 Other chronic pain: Secondary | ICD-10-CM | POA: Diagnosis not present

## 2013-12-11 DIAGNOSIS — E119 Type 2 diabetes mellitus without complications: Secondary | ICD-10-CM | POA: Diagnosis not present

## 2013-12-11 DIAGNOSIS — Z6827 Body mass index (BMI) 27.0-27.9, adult: Secondary | ICD-10-CM | POA: Diagnosis not present

## 2014-01-08 DIAGNOSIS — E119 Type 2 diabetes mellitus without complications: Secondary | ICD-10-CM | POA: Diagnosis not present

## 2014-01-08 DIAGNOSIS — Z6827 Body mass index (BMI) 27.0-27.9, adult: Secondary | ICD-10-CM | POA: Diagnosis not present

## 2014-01-08 DIAGNOSIS — E039 Hypothyroidism, unspecified: Secondary | ICD-10-CM | POA: Diagnosis not present

## 2014-01-08 DIAGNOSIS — G8929 Other chronic pain: Secondary | ICD-10-CM | POA: Diagnosis not present

## 2014-01-09 DIAGNOSIS — R109 Unspecified abdominal pain: Secondary | ICD-10-CM | POA: Diagnosis not present

## 2014-01-11 ENCOUNTER — Encounter: Payer: Self-pay | Admitting: *Deleted

## 2014-01-25 ENCOUNTER — Telehealth: Payer: Self-pay

## 2014-01-25 NOTE — Telephone Encounter (Signed)
Pt called- for 2 days she has had abd pain, some nausea, a swollen abd and her blood sugars have been fluctuating up and down. Pt thinks it may be pancreatitis and wants to know if we can check some blood work to see if it is. I advised pt that we havent seen her in 1 year and that I would have to check on this and call her back. Her phone number is [phone]

## 2014-01-25 NOTE — Telephone Encounter (Signed)
Pt has a appt with Magda Paganini 01/29/14 at 9:00 and pt is aware of the appt.

## 2014-01-25 NOTE — Telephone Encounter (Signed)
If she has no fever, chills, tolerating diet, would offer an OV.  With the upcoming holiday, would recommend ED evaluation if she has worsening abdominal pain, inability to tolerate liquids.  For now: have her follow a clear liquid diet for next 24 hours. If symptoms persist, go to ED.

## 2014-01-25 NOTE — Telephone Encounter (Signed)
Pt aware.  Please schedule ov. 

## 2014-01-26 DIAGNOSIS — J45909 Unspecified asthma, uncomplicated: Secondary | ICD-10-CM | POA: Diagnosis not present

## 2014-01-26 DIAGNOSIS — R1084 Generalized abdominal pain: Secondary | ICD-10-CM | POA: Diagnosis not present

## 2014-01-26 DIAGNOSIS — F411 Generalized anxiety disorder: Secondary | ICD-10-CM | POA: Diagnosis not present

## 2014-01-26 DIAGNOSIS — K219 Gastro-esophageal reflux disease without esophagitis: Secondary | ICD-10-CM | POA: Diagnosis not present

## 2014-01-26 DIAGNOSIS — I10 Essential (primary) hypertension: Secondary | ICD-10-CM | POA: Diagnosis not present

## 2014-01-26 DIAGNOSIS — R112 Nausea with vomiting, unspecified: Secondary | ICD-10-CM | POA: Diagnosis not present

## 2014-01-26 DIAGNOSIS — F17200 Nicotine dependence, unspecified, uncomplicated: Secondary | ICD-10-CM | POA: Diagnosis not present

## 2014-01-26 DIAGNOSIS — Z79899 Other long term (current) drug therapy: Secondary | ICD-10-CM | POA: Diagnosis not present

## 2014-01-26 DIAGNOSIS — R109 Unspecified abdominal pain: Secondary | ICD-10-CM | POA: Diagnosis not present

## 2014-01-29 ENCOUNTER — Telehealth: Payer: Self-pay | Admitting: Gastroenterology

## 2014-01-29 ENCOUNTER — Ambulatory Visit: Payer: Medicare Other | Admitting: Gastroenterology

## 2014-01-29 NOTE — Telephone Encounter (Signed)
Pt called this morning at 0803 to confirm her OV and then she was a no show

## 2014-01-30 DIAGNOSIS — E119 Type 2 diabetes mellitus without complications: Secondary | ICD-10-CM | POA: Diagnosis not present

## 2014-02-07 DIAGNOSIS — Z6827 Body mass index (BMI) 27.0-27.9, adult: Secondary | ICD-10-CM | POA: Diagnosis not present

## 2014-02-07 DIAGNOSIS — G8929 Other chronic pain: Secondary | ICD-10-CM | POA: Diagnosis not present

## 2014-02-07 DIAGNOSIS — F411 Generalized anxiety disorder: Secondary | ICD-10-CM | POA: Diagnosis not present

## 2014-02-14 ENCOUNTER — Ambulatory Visit: Payer: Medicare Other | Admitting: Gastroenterology

## 2014-02-19 ENCOUNTER — Encounter (HOSPITAL_COMMUNITY): Payer: Self-pay | Admitting: Emergency Medicine

## 2014-02-19 ENCOUNTER — Emergency Department (HOSPITAL_COMMUNITY): Payer: Medicare Other

## 2014-02-19 ENCOUNTER — Inpatient Hospital Stay (HOSPITAL_COMMUNITY)
Admission: EM | Admit: 2014-02-19 | Discharge: 2014-02-20 | DRG: 190 | Disposition: A | Discharge status: Home / Self Care | Payer: Medicare Other | Attending: Internal Medicine | Admitting: Internal Medicine

## 2014-02-19 DIAGNOSIS — Z79899 Other long term (current) drug therapy: Secondary | ICD-10-CM

## 2014-02-19 DIAGNOSIS — J45901 Unspecified asthma with (acute) exacerbation: Secondary | ICD-10-CM

## 2014-02-19 DIAGNOSIS — R05 Cough: Secondary | ICD-10-CM | POA: Diagnosis not present

## 2014-02-19 DIAGNOSIS — R0609 Other forms of dyspnea: Secondary | ICD-10-CM | POA: Diagnosis not present

## 2014-02-19 DIAGNOSIS — R109 Unspecified abdominal pain: Secondary | ICD-10-CM | POA: Diagnosis not present

## 2014-02-19 DIAGNOSIS — K5900 Constipation, unspecified: Secondary | ICD-10-CM | POA: Diagnosis present

## 2014-02-19 DIAGNOSIS — F319 Bipolar disorder, unspecified: Secondary | ICD-10-CM | POA: Diagnosis present

## 2014-02-19 DIAGNOSIS — F411 Generalized anxiety disorder: Secondary | ICD-10-CM | POA: Diagnosis present

## 2014-02-19 DIAGNOSIS — Z23 Encounter for immunization: Secondary | ICD-10-CM

## 2014-02-19 DIAGNOSIS — F17200 Nicotine dependence, unspecified, uncomplicated: Secondary | ICD-10-CM | POA: Diagnosis present

## 2014-02-19 DIAGNOSIS — I10 Essential (primary) hypertension: Secondary | ICD-10-CM | POA: Diagnosis not present

## 2014-02-19 DIAGNOSIS — E785 Hyperlipidemia, unspecified: Secondary | ICD-10-CM | POA: Diagnosis present

## 2014-02-19 DIAGNOSIS — J9601 Acute respiratory failure with hypoxia: Secondary | ICD-10-CM

## 2014-02-19 DIAGNOSIS — R062 Wheezing: Secondary | ICD-10-CM | POA: Diagnosis not present

## 2014-02-19 DIAGNOSIS — J441 Chronic obstructive pulmonary disease with (acute) exacerbation: Principal | ICD-10-CM | POA: Diagnosis present

## 2014-02-19 DIAGNOSIS — E119 Type 2 diabetes mellitus without complications: Secondary | ICD-10-CM | POA: Diagnosis present

## 2014-02-19 DIAGNOSIS — K861 Other chronic pancreatitis: Secondary | ICD-10-CM | POA: Diagnosis present

## 2014-02-19 DIAGNOSIS — J9600 Acute respiratory failure, unspecified whether with hypoxia or hypercapnia: Secondary | ICD-10-CM | POA: Diagnosis present

## 2014-02-19 DIAGNOSIS — D62 Acute posthemorrhagic anemia: Secondary | ICD-10-CM | POA: Diagnosis not present

## 2014-02-19 DIAGNOSIS — G8929 Other chronic pain: Secondary | ICD-10-CM | POA: Diagnosis present

## 2014-02-19 DIAGNOSIS — R059 Cough, unspecified: Secondary | ICD-10-CM | POA: Diagnosis not present

## 2014-02-19 DIAGNOSIS — Z888 Allergy status to other drugs, medicaments and biological substances status: Secondary | ICD-10-CM | POA: Diagnosis not present

## 2014-02-19 DIAGNOSIS — Z825 Family history of asthma and other chronic lower respiratory diseases: Secondary | ICD-10-CM | POA: Diagnosis not present

## 2014-02-19 LAB — CBC WITH DIFFERENTIAL/PLATELET
Basophils Absolute: 0 10*3/uL (ref 0.0–0.1)
Basophils Relative: 0 % (ref 0–1)
EOS ABS: 1 10*3/uL — AB (ref 0.0–0.7)
Eosinophils Relative: 8 % — ABNORMAL HIGH (ref 0–5)
HCT: 40 % (ref 36.0–46.0)
HEMOGLOBIN: 13.5 g/dL (ref 12.0–15.0)
LYMPHS ABS: 1.8 10*3/uL (ref 0.7–4.0)
Lymphocytes Relative: 16 % (ref 12–46)
MCH: 29.6 pg (ref 26.0–34.0)
MCHC: 33.8 g/dL (ref 30.0–36.0)
MCV: 87.7 fL (ref 78.0–100.0)
MONO ABS: 0.7 10*3/uL (ref 0.1–1.0)
MONOS PCT: 6 % (ref 3–12)
NEUTROS PCT: 70 % (ref 43–77)
Neutro Abs: 8 10*3/uL — ABNORMAL HIGH (ref 1.7–7.7)
Platelets: 197 10*3/uL (ref 150–400)
RBC: 4.56 MIL/uL (ref 3.87–5.11)
RDW: 12.9 % (ref 11.5–15.5)
WBC: 11.5 10*3/uL — ABNORMAL HIGH (ref 4.0–10.5)

## 2014-02-19 LAB — GLUCOSE, CAPILLARY
GLUCOSE-CAPILLARY: 323 mg/dL — AB (ref 70–99)
GLUCOSE-CAPILLARY: 262 mg/dL — AB (ref 70–99)

## 2014-02-19 LAB — URINALYSIS, ROUTINE W REFLEX MICROSCOPIC
Bilirubin Urine: NEGATIVE
Glucose, UA: NEGATIVE mg/dL
Hgb urine dipstick: NEGATIVE
Ketones, ur: NEGATIVE mg/dL
LEUKOCYTES UA: NEGATIVE
Nitrite: NEGATIVE
PH: 5.5 (ref 5.0–8.0)
Protein, ur: NEGATIVE mg/dL
SPECIFIC GRAVITY, URINE: 1.02 (ref 1.005–1.030)
UROBILINOGEN UA: 0.2 mg/dL (ref 0.0–1.0)

## 2014-02-19 LAB — BASIC METABOLIC PANEL
Anion gap: 13 (ref 5–15)
BUN: 9 mg/dL (ref 6–23)
CHLORIDE: 99 meq/L (ref 96–112)
CO2: 27 meq/L (ref 19–32)
CREATININE: 0.66 mg/dL (ref 0.50–1.10)
Calcium: 9.8 mg/dL (ref 8.4–10.5)
GFR calc Af Amer: >90 mL/min (ref >90)
GFR calc non Af Amer: >90 mL/min (ref >90)
Glucose, Bld: 187 mg/dL — ABNORMAL HIGH (ref 70–99)
Potassium: 3.9 mEq/L (ref 3.7–5.3)
Sodium: 139 mEq/L (ref 137–147)

## 2014-02-19 LAB — PRO B NATRIURETIC PEPTIDE: Pro B Natriuretic peptide (BNP): 33.7 pg/mL (ref 0–125)

## 2014-02-19 LAB — I-STAT TROPONIN, ED: TROPONIN I, POC: 0 ng/mL (ref 0.00–0.08)

## 2014-02-19 MED ORDER — POLYETHYLENE GLYCOL 3350 17 G PO PACK
17.0000 g | PACK | Freq: Two times a day (BID) | ORAL | Status: DC
  Administered 2014-02-19 – 2014-02-20 (×3): 17 g via ORAL
  Filled 2014-02-19 (×3): qty 1

## 2014-02-19 MED ORDER — LORAZEPAM 0.5 MG PO TABS
0.5000 mg | ORAL_TABLET | Freq: Three times a day (TID) | ORAL | Status: DC | PRN
  Administered 2014-02-19 – 2014-02-20 (×3): 0.5 mg via ORAL
  Filled 2014-02-19 (×3): qty 1

## 2014-02-19 MED ORDER — SODIUM CHLORIDE 0.9 % IJ SOLN: 3.0000 mL | INTRAMUSCULAR | Status: DC | PRN

## 2014-02-19 MED ORDER — OXYCODONE HCL 5 MG PO TABS
15.0000 mg | ORAL_TABLET | ORAL | Status: DC | PRN
  Administered 2014-02-19 – 2014-02-20 (×3): 15 mg via ORAL
  Filled 2014-02-19 (×3): qty 3

## 2014-02-19 MED ORDER — ALBUTEROL SULFATE (2.5 MG/3ML) 0.083% IN NEBU
10.0000 mg | INHALATION_SOLUTION | Freq: Once | RESPIRATORY_TRACT | Status: AC
  Administered 2014-02-19: 10 mg via RESPIRATORY_TRACT
  Filled 2014-02-19: qty 12

## 2014-02-19 MED ORDER — METHYLPREDNISOLONE SODIUM SUCC 125 MG IJ SOLR
125.0000 mg | Freq: Once | INTRAMUSCULAR | Status: AC
  Administered 2014-02-19: 125 mg via INTRAVENOUS
  Filled 2014-02-19: qty 2

## 2014-02-19 MED ORDER — NICOTINE 14 MG/24HR TD PT24
14.0000 mg | MEDICATED_PATCH | Freq: Every day | TRANSDERMAL | Status: DC
  Administered 2014-02-19 – 2014-02-20 (×2): 14 mg via TRANSDERMAL
  Filled 2014-02-19 (×2): qty 1

## 2014-02-19 MED ORDER — ACETAMINOPHEN 650 MG RE SUPP: 650.0000 mg | Freq: Four times a day (QID) | RECTAL | Status: DC | PRN

## 2014-02-19 MED ORDER — DOCUSATE SODIUM 100 MG PO CAPS
100.0000 mg | ORAL_CAPSULE | Freq: Every day | ORAL | Status: DC
  Administered 2014-02-19: 100 mg via ORAL
  Filled 2014-02-19: qty 1

## 2014-02-19 MED ORDER — IPRATROPIUM-ALBUTEROL 0.5-2.5 (3) MG/3ML IN SOLN
3.0000 mL | RESPIRATORY_TRACT | Status: DC
  Administered 2014-02-19: 3 mL via RESPIRATORY_TRACT
  Filled 2014-02-19: qty 3

## 2014-02-19 MED ORDER — IPRATROPIUM-ALBUTEROL 0.5-2.5 (3) MG/3ML IN SOLN
3.0000 mL | Freq: Four times a day (QID) | RESPIRATORY_TRACT | Status: DC
  Administered 2014-02-19 – 2014-02-20 (×3): 3 mL via RESPIRATORY_TRACT
  Filled 2014-02-19 (×3): qty 3

## 2014-02-19 MED ORDER — ONDANSETRON HCL 4 MG/2ML IJ SOLN: 4.0000 mg | Freq: Four times a day (QID) | INTRAMUSCULAR | Status: DC | PRN

## 2014-02-19 MED ORDER — PANTOPRAZOLE SODIUM 40 MG PO TBEC
40.0000 mg | DELAYED_RELEASE_TABLET | Freq: Two times a day (BID) | ORAL | Status: DC
  Administered 2014-02-19 – 2014-02-20 (×3): 40 mg via ORAL
  Filled 2014-02-19 (×3): qty 1

## 2014-02-19 MED ORDER — SENNA 8.6 MG PO TABS
1.0000 | ORAL_TABLET | Freq: Every day | ORAL | Status: DC
  Administered 2014-02-19: 8.6 mg via ORAL
  Filled 2014-02-19: qty 1

## 2014-02-19 MED ORDER — MORPHINE SULFATE 4 MG/ML IJ SOLN
4.0000 mg | Freq: Once | INTRAMUSCULAR | Status: AC
  Administered 2014-02-19: 4 mg via INTRAVENOUS
  Filled 2014-02-19: qty 1

## 2014-02-19 MED ORDER — ONDANSETRON HCL 4 MG PO TABS: 4.0000 mg | ORAL_TABLET | Freq: Four times a day (QID) | ORAL | Status: DC | PRN

## 2014-02-19 MED ORDER — ACETAMINOPHEN 325 MG PO TABS
650.0000 mg | ORAL_TABLET | Freq: Four times a day (QID) | ORAL | Status: DC | PRN
  Administered 2014-02-19: 650 mg via ORAL
  Filled 2014-02-19: qty 2

## 2014-02-19 MED ORDER — SODIUM CHLORIDE 0.9 % IV SOLN: 250.0000 mL | INTRAVENOUS | Status: DC | PRN

## 2014-02-19 MED ORDER — PNEUMOCOCCAL VAC POLYVALENT 25 MCG/0.5ML IJ INJ
0.5000 mL | INJECTION | INTRAMUSCULAR | Status: DC
  Filled 2014-02-19: qty 0.5

## 2014-02-19 MED ORDER — SODIUM CHLORIDE 0.9 % IJ SOLN
3.0000 mL | Freq: Two times a day (BID) | INTRAMUSCULAR | Status: DC
  Administered 2014-02-19 – 2014-02-20 (×3): 3 mL via INTRAVENOUS

## 2014-02-19 MED ORDER — NICOTINE 14 MG/24HR TD PT24: 14.0000 mg | MEDICATED_PATCH | Freq: Every day | TRANSDERMAL | Status: DC

## 2014-02-19 MED ORDER — METHYLPREDNISOLONE SODIUM SUCC 40 MG IJ SOLR
40.0000 mg | Freq: Two times a day (BID) | INTRAMUSCULAR | Status: DC
  Administered 2014-02-19 – 2014-02-20 (×2): 40 mg via INTRAVENOUS
  Filled 2014-02-19 (×2): qty 1

## 2014-02-19 MED ORDER — INSULIN ASPART 100 UNIT/ML ~~LOC~~ SOLN
0.0000 [IU] | Freq: Every day | SUBCUTANEOUS | Status: DC
  Administered 2014-02-19: 3 [IU] via SUBCUTANEOUS

## 2014-02-19 MED ORDER — AMITRIPTYLINE HCL 25 MG PO TABS
50.0000 mg | ORAL_TABLET | Freq: Every day | ORAL | Status: DC
  Administered 2014-02-19: 50 mg via ORAL
  Filled 2014-02-19 (×2): qty 2

## 2014-02-19 MED ORDER — INSULIN ASPART 100 UNIT/ML ~~LOC~~ SOLN
0.0000 [IU] | Freq: Three times a day (TID) | SUBCUTANEOUS | Status: DC
  Administered 2014-02-19: 7 [IU] via SUBCUTANEOUS
  Administered 2014-02-20: 1 [IU] via SUBCUTANEOUS
  Administered 2014-02-20: 3 [IU] via SUBCUTANEOUS

## 2014-02-19 MED ORDER — AMLODIPINE BESYLATE 5 MG PO TABS
10.0000 mg | ORAL_TABLET | Freq: Every day | ORAL | Status: DC
  Administered 2014-02-19: 10 mg via ORAL
  Filled 2014-02-19: qty 2

## 2014-02-19 MED ORDER — PANCRELIPASE (LIP-PROT-AMYL) 12000-38000 UNITS PO CPEP
3.0000 | ORAL_CAPSULE | Freq: Three times a day (TID) | ORAL | Status: DC
  Administered 2014-02-19 – 2014-02-20 (×2): 3 via ORAL
  Administered 2014-02-20: 3 [IU] via ORAL
  Filled 2014-02-19 (×4): qty 3

## 2014-02-19 NOTE — H&P (Signed)
History and Physical  HELMA ARGYLE DJM:[id number] DOB: 12/16/1970 DOA: 02/19/2014  Referring physician: Evalee Jefferson PA-C in ED PCP: Glo Herring., MD   Chief Complaint: short of breath  HPI:  43 year old woman with tobacco dependence, ongoing smoking, asthma presented to the emergency department with two-day history of increasing shortness of breath refractory to breathing treatments. Initial evaluation suggested uncomplicated asthma exacerbation.  Patient reports 3 days ago she had swelling of her arms and legs which spontaneously resolved. Yesterday she developed increasing cough and shortness of breath despite nebulizer use. No specific aggravating or alleviating factors. She had several episodes of vomiting but has been able to tolerate some food today without vomiting. In the emergency department she was treated with steroids and bronchodilators. Currently feeling better.  In the emergency department afebrile, tachycardic, hypoxic 87% on RA. BMP, BNP, troponin, CBC, U/A unremarkable. CXR NAD independently viewed. EKG sinus tachycardia, right bundle branch block, no acute changes. Compared to previous study 06/29/2013, no acute changes seen.  Review of Systems:  Negative for fever, visual changes, sore throat, rash, new muscle aches, chest pain, dysuria, bleeding.   Positive for constipation.   Past Medical History  Diagnosis Date  . Asthma   . Depression   . Migraine   . Pancreatitis chronic Idiopathic    Treated by Dr. Newman Pies at [facility] in the past with celiac blocks.  . Bipolar 1 disorder   . Hyperlipidemia 02/03/2012  . Diabetes mellitus   . COPD (chronic obstructive pulmonary disease)   . Neck pain 06/08/2012  . Chronic abdominal pain   . Anemia   . Anxiety   . Hiatal hernia   . Esophagitis     Past Surgical History  Procedure Laterality Date  . Cholecystectomy    . Tonsillectomy    . Abdominal hysterectomy    . Esophagogastroduodenoscopy   02/20/2008    QIW:[id number] distal esophageal mucosa, suspicious for neoplasm/Hiatal hernia otherwise normal   . Esophagogastroduodenoscopy  04/03/2008    ERD:[id number] hiatal hernia, otherwise normal/Short, tight, benign-appearing peptic stricture  . Ileocolonoscopy  06/05/2008      SHF:WYOVZCH anal canal, otherwise normal rectum, colon  . Esophagogastroduodenoscopy  November 16, 2012    Dr. Britta Mccreedy: hiatal hernia, esophagitis,   . Givens capsule study N/A 12/05/2012    Few superficial erosions but nothing found to explain iron deficiency anemia.   . Colonoscopy N/A 12/05/2012    YIF:OYDXAJ rectum, colon and terminal ileum  . Celiac plexus block      Social History:  reports that she has been smoking Cigarettes.  She has a 15 pack-year smoking history. She has quit using smokeless tobacco. She reports that she does not drink alcohol or use illicit drugs.  Allergies  Allergen Reactions  . Penicillins Shortness Of Breath       . Sulfa Antibiotics Shortness Of Breath  . Prednisone Other (See Comments)    Pancreatitis, but has to take for asthma sometimes    Family History  Problem Relation Age of Onset  . Asthma Other   . Coronary artery disease Neg Hx   . Colon cancer Neg Hx      Prior to Admission medications   Medication Sig Start Date End Date Taking? Authorizing Provider  albuterol (PROVENTIL HFA;VENTOLIN HFA) 108 (90 BASE) MCG/ACT inhaler Inhale 2 puffs into the lungs daily as needed. For shortness of breath   Yes Historical Provider, MD  amitriptyline (ELAVIL) 50 MG tablet Take 50 mg by mouth at bedtime.  Yes Historical Provider, MD  amLODipine (NORVASC) 10 MG tablet Take 10 mg by mouth at bedtime.    Yes Historical Provider, MD  docusate sodium (COLACE) 100 MG capsule Take 100 mg by mouth at bedtime.    Yes Historical Provider, MD  ferrous sulfate 325 (65 FE) MG tablet Take 325 mg by mouth daily.   Yes Historical Provider, MD  Fluticasone-Salmeterol (ADVAIR DISKUS)  250-50 MCG/DOSE AEPB Inhale 1 puff into the lungs 2 (two) times daily. 07/01/13  Yes Shanker Kristeen Mans, MD  hydrOXYzine (ATARAX/VISTARIL) 25 MG tablet Take 1 tablet (25 mg total) by mouth every 6 (six) hours as needed for anxiety. 02/27/13  Yes Blanchard Kelch, MD  ipratropium-albuterol (DUONEB) 0.5-2.5 (3) MG/3ML SOLN Inhale 3 mLs into the lungs every 6 (six) hours as needed. Take 3 mLs by nebulization every 6 (six) hours as needed for Wheezing. 07/01/13  Yes Shanker Kristeen Mans, MD  LORazepam (ATIVAN) 0.5 MG tablet Take 0.5 mg by mouth every 8 (eight) hours as needed for anxiety.    Yes Historical Provider, MD  metFORMIN (GLUCOPHAGE) 500 MG tablet Take 500 mg by mouth daily. 07/01/13  Yes Shanker Kristeen Mans, MD  oxyCODONE (ROXICODONE) 15 MG immediate release tablet Take 15 mg by mouth every 4 (four) hours as needed for pain.   Yes Historical Provider, MD  Pancrelipase, Lip-Prot-Amyl, (ZENPEP) 20000 UNITS CPEP Take 3 capsules (60,000 Units total) by mouth 3 (three) times daily with meals. 2 with snacks 02/07/13  Yes Mahala Menghini, PA-C  pantoprazole (PROTONIX) 40 MG tablet TAKE (1) TABLET BY MOUTH TWICE DAILY. 09/27/13  Yes Orvil Feil, NP  polyethylene glycol powder (GLYCOLAX/MIRALAX) powder Take 17 g by mouth daily as needed. for Constipation.   Yes Historical Provider, MD  promethazine (PHENERGAN) 25 MG tablet TAKE (1) TABLET BY MOUTH EVERY SIX HOURS AS NEEDED FOR NAUSEA. 07/28/13  Yes Mahala Menghini, PA-C   Physical Exam: Filed Vitals:   02/19/14 0900 02/19/14 0930 02/19/14 1000 02/19/14 1008  BP: 116/70 107/63 120/80 120/80  Pulse: 110 110 117 116  Temp:      TempSrc:      Resp: 13 19 19 20   Height:      Weight:      SpO2: 99% 100% 92% 90%    General:  examined in the emergency department.  Appears calm and comfortable Eyes: PERRL, normal lids, irises  ENT: grossly normal hearing, lips Neck: no LAD, masses or thyromegaly Cardiovascular: RRR, no m/r/g. No LE edema. Respiratory: bilateral  wheezes posterior spleen with some rhonchi. Anterior clear. Respiratory: Clear to auscultation bilaterally. No wheezes, rales, rhonchi. Normal respiratory effort. Fair movement.  Abdomen: soft, ntnd Skin: no rash or induration seen  Musculoskeletal: grossly normal tone BUE/BLE Psychiatric: grossly normal mood and affect, speech fluent and appropriate Neurologic: grossly non-focal.  Wt Readings from Last 3 Encounters:  02/19/14 70.761 kg (156 lb)  06/29/13 71 kg (156 lb 8.4 oz)  04/16/13 73.936 kg (163 lb)    Labs on Admission:  Basic Metabolic Panel:  Recent Labs Lab 02/19/14 0830  NA 139  K 3.9  CL 99  CO2 27  GLUCOSE 187*  BUN 9  CREATININE 0.66  CALCIUM 9.8    CBC:  Recent Labs Lab 02/19/14 0830  WBC 11.5*  NEUTROABS 8.0*  HGB 13.5  HCT 40.0  MCV 87.7  PLT 197     Recent Labs  02/19/14 0831  TROPIPOC 0.00     Recent Labs  06/29/13  1700 02/19/14 0830  PROBNP 37.5 33.7    Radiological Exams on Admission: Dg Chest Port 1 View  02/19/2014   CLINICAL DATA:  Wheezing and difficulty breathing  EXAM: PORTABLE CHEST - 1 VIEW  COMPARISON:  June 29, 2013  FINDINGS: There is no edema or consolidation. Heart size and pulmonary vascularity are normal. No adenopathy. No bone lesions.  IMPRESSION: No edema or consolidation.   Electronically Signed   By: Lowella Grip M.D.   On: 02/19/2014 08:56    Principal Problem:   Asthma exacerbation Active Problems:   Acute respiratory failure with hypoxia   Assessment/Plan 1. Acute hypoxic respiratory failureSecondary to asthma exacerbation . 2. Asthma exacerbation. possible component of COPD.  3. Diabetes mellitus. Random blood sugar 187. I think abnormal. Appears compensated.  4. Chronic pancreatitis. Appears stable.  5. Anxiety, bipolar 1 disorder, depression. Stable. 6. Constipation.   Admit for observation, steroids, antibiotics, bronchodilators and oxygen as needed.    I have strongly recommended  that she stop smoking. She is interested in nicotine patch.  Sliding-scale insulin.  Bowel regimen.  Code Status: full code DVT prophylaxis: SCDs, early ambulation Family Communication:  Disposition Plan/Anticipated LOS: Obs. 24 hours.  Time spent: 50 minutes  Murray Hodgkins, MD  [facility] Pager [phone] 02/19/2014, 10:53 AM

## 2014-02-19 NOTE — ED Provider Notes (Signed)
CSN: [id number]     Arrival date & time 02/19/14  0749 History   First MD Initiated Contact with Patient 02/19/14 0759     Chief Complaint  Patient presents with  . Asthma     (Consider location/radiation/quality/duration/timing/severity/associated sxs/prior Treatment) The history is provided by the patient and the spouse.   [name] is a 43 y.o. female presenting with a two-day history of asthma exacerbation.  She describes having increased shortness of breath with wheezing and coughing which has not responded to her home nebulizer treatments.  She has had 3 neb treatments in the past 12 hours, the last just prior to arrival with no significant improvement in her wheezing and shortness of breath.  She also describes having increased urinary frequency and bilateral ankle and hand edema also starting 2 days ago which has since improved.  She endorses orthopnea.  She does not have a history of CHF.  She is treated for hypertension and diabetes.  She has been afebrile.  She has been coughing which has been nonproductive.  She does have a history of prior admissions for asthma exacerbation.  She had a right-sided chest pressure which radiated into her back 2 days ago which resolved spontaneously.   Past Medical History  Diagnosis Date  . Asthma   . Depression   . Migraine   . Pancreatitis chronic Idiopathic    Treated by Dr. Newman Pies at [facility] in the past with celiac blocks.  . Bipolar 1 disorder   . Hyperlipidemia 02/03/2012  . Diabetes mellitus   . COPD (chronic obstructive pulmonary disease)   . Neck pain 06/08/2012  . Chronic abdominal pain   . Anemia   . Anxiety   . Hiatal hernia   . Esophagitis    Past Surgical History  Procedure Laterality Date  . Cholecystectomy    . Tonsillectomy    . Abdominal hysterectomy    . Esophagogastroduodenoscopy  02/20/2008    BVQ:XIHWTUUEK distal esophageal mucosa, suspicious for neoplasm/Hiatal hernia otherwise normal   .  Esophagogastroduodenoscopy  04/03/2008    CMK:[id number] hiatal hernia, otherwise normal/Short, tight, benign-appearing peptic stricture  . Ileocolonoscopy  06/05/2008      XYI:AXKPVVZ anal canal, otherwise normal rectum, colon  . Esophagogastroduodenoscopy  November 16, 2012    Dr. Britta Mccreedy: hiatal hernia, esophagitis,   . Givens capsule study N/A 12/05/2012    Few superficial erosions but nothing found to explain iron deficiency anemia.   . Colonoscopy N/A 12/05/2012    SMO:LMBEML rectum, colon and terminal ileum  . Celiac plexus block     Family History  Problem Relation Age of Onset  . Asthma Other   . Coronary artery disease Neg Hx   . Colon cancer Neg Hx    History  Substance Use Topics  . Smoking status: Current Every Day Smoker -- 1.00 packs/day for 15 years    Types: Cigarettes  . Smokeless tobacco: Former Systems developer  . Alcohol Use: No   OB History   Grav Para Term Preterm Abortions TAB SAB Ect Mult Living                 Review of Systems  Constitutional: Negative for fever and chills.  HENT: Negative for congestion and sore throat.   Eyes: Negative.   Respiratory: Positive for cough, shortness of breath and wheezing. Negative for chest tightness.   Cardiovascular: Positive for leg swelling. Negative for chest pain.       Leg and hand edema.  Gastrointestinal: Negative for nausea and abdominal pain.  Genitourinary: Positive for frequency. Negative for dysuria.  Musculoskeletal: Negative for arthralgias and neck pain.  Skin: Negative.  Negative for rash and wound.  Neurological: Negative for dizziness, weakness, light-headedness, numbness and headaches.  Psychiatric/Behavioral: Negative.       Allergies  Penicillins; Sulfa antibiotics; and Prednisone  Home Medications   Prior to Admission medications   Medication Sig Start Date End Date Taking? Authorizing Provider  albuterol (PROVENTIL HFA;VENTOLIN HFA) 108 (90 BASE) MCG/ACT inhaler Inhale 2 puffs into the  lungs daily as needed. For shortness of breath   Yes Historical Provider, MD  amitriptyline (ELAVIL) 50 MG tablet Take 50 mg by mouth at bedtime.   Yes Historical Provider, MD  amLODipine (NORVASC) 10 MG tablet Take 10 mg by mouth at bedtime.    Yes Historical Provider, MD  docusate sodium (COLACE) 100 MG capsule Take 100 mg by mouth at bedtime.    Yes Historical Provider, MD  ferrous sulfate 325 (65 FE) MG tablet Take 325 mg by mouth daily.   Yes Historical Provider, MD  Fluticasone-Salmeterol (ADVAIR DISKUS) 250-50 MCG/DOSE AEPB Inhale 1 puff into the lungs 2 (two) times daily. 07/01/13  Yes Shanker Kristeen Mans, MD  hydrOXYzine (ATARAX/VISTARIL) 25 MG tablet Take 1 tablet (25 mg total) by mouth every 6 (six) hours as needed for anxiety. 02/27/13  Yes Blanchard Kelch, MD  ipratropium-albuterol (DUONEB) 0.5-2.5 (3) MG/3ML SOLN Inhale 3 mLs into the lungs every 6 (six) hours as needed. Take 3 mLs by nebulization every 6 (six) hours as needed for Wheezing. 07/01/13  Yes Shanker Kristeen Mans, MD  LORazepam (ATIVAN) 0.5 MG tablet Take 0.5 mg by mouth every 8 (eight) hours as needed for anxiety.    Yes Historical Provider, MD  metFORMIN (GLUCOPHAGE) 500 MG tablet Take 500 mg by mouth daily. 07/01/13  Yes Shanker Kristeen Mans, MD  oxyCODONE (ROXICODONE) 15 MG immediate release tablet Take 15 mg by mouth every 4 (four) hours as needed for pain.   Yes Historical Provider, MD  Pancrelipase, Lip-Prot-Amyl, (ZENPEP) 20000 UNITS CPEP Take 3 capsules (60,000 Units total) by mouth 3 (three) times daily with meals. 2 with snacks 02/07/13  Yes Mahala Menghini, PA-C  pantoprazole (PROTONIX) 40 MG tablet TAKE (1) TABLET BY MOUTH TWICE DAILY. 09/27/13  Yes Orvil Feil, NP  polyethylene glycol powder (GLYCOLAX/MIRALAX) powder Take 17 g by mouth daily as needed. for Constipation.   Yes Historical Provider, MD  promethazine (PHENERGAN) 25 MG tablet TAKE (1) TABLET BY MOUTH EVERY SIX HOURS AS NEEDED FOR NAUSEA. 07/28/13  Yes Mahala Menghini,  PA-C   BP 107/75  Pulse 118  Temp(Src) 97.5 F (36.4 C) (Oral)  Resp 20  Ht 5\' 2"  (1.575 m)  Wt 156 lb (70.761 kg)  BMI 28.53 kg/m2  SpO2 93% Physical Exam  Nursing note and vitals reviewed. Constitutional: She appears well-developed and well-nourished.  HENT:  Head: Normocephalic and atraumatic.  Eyes: Conjunctivae are normal.  Neck: Normal range of motion.  Cardiovascular: Normal rate, regular rhythm, normal heart sounds and intact distal pulses.   Trace bilateral ankle edema.  Pulmonary/Chest: She is in respiratory distress. She has decreased breath sounds. She has wheezes. She has no rhonchi. She has no rales.  Wheezing throughout all lung fields.  Abdominal: Soft. Bowel sounds are normal. There is no tenderness.  Musculoskeletal: Normal range of motion.  Neurological: She is alert.  Skin: Skin is warm and dry.  Psychiatric: She has  a normal mood and affect.    ED Course  Procedures (including critical care time) Labs Review Labs Reviewed  CBC WITH DIFFERENTIAL - Abnormal; Notable for the following:    WBC 11.5 (*)    Neutro Abs 8.0 (*)    Eosinophils Relative 8 (*)    Eosinophils Absolute 1.0 (*)    All other components within normal limits  BASIC METABOLIC PANEL - Abnormal; Notable for the following:    Glucose, Bld 187 (*)    All other components within normal limits  PRO B NATRIURETIC PEPTIDE  URINALYSIS, ROUTINE W REFLEX MICROSCOPIC  I-STAT TROPOININ, ED    Imaging Review Dg Chest Port 1 View  02/19/2014   CLINICAL DATA:  Wheezing and difficulty breathing  EXAM: PORTABLE CHEST - 1 VIEW  COMPARISON:  June 29, 2013  FINDINGS: There is no edema or consolidation. Heart size and pulmonary vascularity are normal. No adenopathy. No bone lesions.  IMPRESSION: No edema or consolidation.   Electronically Signed   By: Lowella Grip M.D.   On: 02/19/2014 08:56     EKG Interpretation None      MDM   Final diagnoses:  Asthma exacerbation    The patient  was given albuterol neb over one hour along with Solu-Medrol per IV.  She had mild improvement in her wheezing which persists.  She became increasingly short of breath and felt weak with ambulation.  Patient feels she needs to be admitted as her symptoms are more severe than she feels she can manage at home.  I agree with this as is Dr. Roderic Palau who also saw patient during this ED visit.  Call placed to Dr. Sarajane Jews excepts patient for admission.  Temporary admission orders were ordered.    Evalee Jefferson, PA-C 02/19/14 [place], PA-C 02/19/14 1715

## 2014-02-19 NOTE — ED Notes (Signed)
Pt states asthma flare up began Saturday. States ankles were swollen Saturday also. Wheezing noted.

## 2014-02-20 DIAGNOSIS — R109 Unspecified abdominal pain: Secondary | ICD-10-CM

## 2014-02-20 DIAGNOSIS — F411 Generalized anxiety disorder: Secondary | ICD-10-CM

## 2014-02-20 DIAGNOSIS — D62 Acute posthemorrhagic anemia: Secondary | ICD-10-CM

## 2014-02-20 LAB — GLUCOSE, CAPILLARY
Glucose-Capillary: 142 mg/dL — ABNORMAL HIGH (ref 70–99)
Glucose-Capillary: 222 mg/dL — ABNORMAL HIGH (ref 70–99)

## 2014-02-20 MED ORDER — PREDNISONE (PAK) 10 MG PO TABS: ORAL_TABLET | Freq: Every day | ORAL | Status: DC

## 2014-02-20 MED ORDER — NICOTINE 14 MG/24HR TD PT24: 14.0000 mg | MEDICATED_PATCH | Freq: Every day | TRANSDERMAL | Status: DC

## 2014-02-20 NOTE — ED Provider Notes (Signed)
Medical screening examination/treatment/procedure(s) were performed by non-physician practitioner and as supervising physician I was immediately available for consultation/collaboration.   EKG Interpretation None        Maudry Diego, MD 02/20/14 [phone]

## 2014-02-20 NOTE — Progress Notes (Signed)
Inpatient Diabetes Program Recommendations  AACE/ADA: New Consensus Statement on Inpatient Glycemic Control (2013)  Target Ranges:  Prepandial:   less than 140 mg/dL      Peak postprandial:   less than 180 mg/dL (1-2 hours)      Critically ill patients:  140 - 180 mg/dL   Results for [name], [name] (MRN [id number]) as of 02/20/2014 08:10  Ref. Range 02/19/2014 16:07 02/19/2014 20:42 02/20/2014 07:08  Glucose-Capillary Latest Range: 70-99 mg/dL 323 (H) 262 (H) 142 (H)   Diabetes history: DM2 Outpatient Diabetes medications: Metformin 500 mg daily Current orders for Inpatient glycemic control: Novolog 0-9 units AC, Novolog 0-5 units HS  Inpatient Diabetes Program Recommendations Correction (SSI): Please consider increasing Novolog correction scale to moderate or resistant scale since patient is on steroids.  Thanks, Barnie Alderman, RN, MSN, CCRN Diabetes Coordinator Inpatient Diabetes Program [phone] (Team Pager) [phone] ([facility] office) [phone] [facility] office)

## 2014-02-20 NOTE — Discharge Summary (Addendum)
Physician Discharge Summary  [name] UXL:[id number] DOB: [birth year] DOA: 02/19/2014  PCP: Glo Herring., MD  Admit date: 02/19/2014 Discharge date: 02/20/2014  Time spent: 40 minutes  Recommendations for Outpatient Follow-up:  1. followup with primary care physician within one week.  Discharge Diagnoses:  Principal Problem:   Asthma exacerbation Active Problems:   Acute respiratory failure with hypoxia   Discharge Condition: Stable  Diet recommendation: Heart healthy  Filed Weights   02/19/14 0756 02/19/14 1311  Weight: 70.761 kg (156 lb) 72.9 kg (160 lb 11.5 oz)    History of present illness:  43 year old woman with tobacco dependence, ongoing smoking, asthma presented to the emergency department with two-day history of increasing shortness of breath refractory to breathing treatments. Initial evaluation suggested uncomplicated asthma exacerbation.  Patient reports 3 days ago she had swelling of her arms and legs which spontaneously resolved. Yesterday she developed increasing cough and shortness of breath despite nebulizer use. No specific aggravating or alleviating factors. She had several episodes of vomiting but has been able to tolerate some food today without vomiting. In the emergency department she was treated with steroids and bronchodilators. Currently feeling better.  In the emergency department afebrile, tachycardic, hypoxic 87% on RA. BMP, BNP, troponin, CBC, U/A unremarkable. CXR NAD independently viewed. EKG sinus tachycardia, right bundle branch block, no acute changes. Compared to previous study 06/29/2013, no acute changes seen.  Hospital Course:   Acute hypoxic respiratory failure Secondary to acute asthma exacerbation. Patient oxygen saturation went down to 87% on room air. Needed 2 L of oxygen to bring the saturations up to lower 90s. Does not need oxygen supplementation today.  Acute asthma exacerbation This is treated with IV steroids,  bronchodilators, mucolytics and antitussives. On day of discharge prednisone taper was given, patient noted to have prednisone allergy. When she was asked she said she takes prednisone sometime for the asthma.  Chronic pain Continue chronic narcotics.  Chronic pancreatitis Continue home medications. Lipase supplements.  Diabetes controlled, A1c is 7.0 on 06/30/13 Patient is on oral metformin, she was on Solu-Medrol while she is in the hospital. Discharge on prednisone, I explained to the patient that her blood sugar will be elevated while she is on prednisone. Especially postprandial blood glucose, this will be back to her baseline when she stops prednisone.  Procedures:  None  Consultations:  None  Discharge Exam: Filed Vitals:   02/20/14 0659  BP: 116/66  Pulse: 104  Temp: 97.7 F (36.5 C)  Resp: 20   General: Alert and awake, oriented x3, not in any acute distress. HEENT: anicteric sclera, pupils reactive to light and accommodation, EOMI CVS: S1-S2 clear, no murmur rubs or gallops Chest: clear to auscultation bilaterally, no wheezing, rales or rhonchi Abdomen: soft nontender, nondistended, normal bowel sounds, no organomegaly Extremities: no cyanosis, clubbing or edema noted bilaterally Neuro: Cranial nerves II-XII intact, no focal neurological deficits  Discharge Instructions You were cared for by a hospitalist during your hospital stay. If you have any questions about your discharge medications or the care you received while you were in the hospital after you are discharged, you can call the unit and asked to speak with the hospitalist on call if the hospitalist that took care of you is not available. Once you are discharged, your primary care physician will handle any further medical issues. Please note that NO REFILLS for any discharge medications will be authorized once you are discharged, as it is imperative that you return to your primary care physician (  or establish  a relationship with a primary care physician if you do not have one) for your aftercare needs so that they can reassess your need for medications and monitor your lab values.      Discharge Instructions   Diet Carb Modified    Complete by:  As directed      Increase activity slowly    Complete by:  As directed             Medication List         albuterol 108 (90 BASE) MCG/ACT inhaler  Commonly known as:  PROVENTIL HFA;VENTOLIN HFA  Inhale 2 puffs into the lungs daily as needed. For shortness of breath     amitriptyline 50 MG tablet  Commonly known as:  ELAVIL  Take 50 mg by mouth at bedtime.     amLODipine 10 MG tablet  Commonly known as:  NORVASC  Take 10 mg by mouth at bedtime.     docusate sodium 100 MG capsule  Commonly known as:  COLACE  Take 100 mg by mouth at bedtime.     ferrous sulfate 325 (65 FE) MG tablet  Take 325 mg by mouth daily.     Fluticasone-Salmeterol 250-50 MCG/DOSE Aepb  Commonly known as:  ADVAIR DISKUS  Inhale 1 puff into the lungs 2 (two) times daily.     hydrOXYzine 25 MG tablet  Commonly known as:  ATARAX/VISTARIL  Take 1 tablet (25 mg total) by mouth every 6 (six) hours as needed for anxiety.     ipratropium-albuterol 0.5-2.5 (3) MG/3ML Soln  Commonly known as:  DUONEB  Inhale 3 mLs into the lungs every 6 (six) hours as needed. Take 3 mLs by nebulization every 6 (six) hours as needed for Wheezing.     LORazepam 0.5 MG tablet  Commonly known as:  ATIVAN  Take 0.5 mg by mouth every 8 (eight) hours as needed for anxiety.     metFORMIN 500 MG tablet  Commonly known as:  GLUCOPHAGE  Take 500 mg by mouth daily.     nicotine 14 mg/24hr patch  Commonly known as:  NICODERM CQ - dosed in mg/24 hours  Place 1 patch (14 mg total) onto the skin daily.     oxyCODONE 15 MG immediate release tablet  Commonly known as:  ROXICODONE  Take 15 mg by mouth every 4 (four) hours as needed for pain.     Pancrelipase (Lip-Prot-Amyl) 20000 UNITS Cpep   Commonly known as:  ZENPEP  Take 3 capsules (60,000 Units total) by mouth 3 (three) times daily with meals. 2 with snacks     pantoprazole 40 MG tablet  Commonly known as:  PROTONIX  TAKE (1) TABLET BY MOUTH TWICE DAILY.     polyethylene glycol powder powder  Commonly known as:  GLYCOLAX/MIRALAX  Take 17 g by mouth daily as needed. for Constipation.     predniSONE 10 MG tablet  Commonly known as:  STERAPRED UNI-PAK  Take by mouth daily. Take 6-5-4-3-2-1 PO daily till gone     promethazine 25 MG tablet  Commonly known as:  PHENERGAN  TAKE (1) TABLET BY MOUTH EVERY SIX HOURS AS NEEDED FOR NAUSEA.       Allergies  Allergen Reactions  . Penicillins Shortness Of Breath       . Sulfa Antibiotics Shortness Of Breath  . Prednisone Other (See Comments)    Pancreatitis, but has to take for asthma sometimes      The results of  significant diagnostics from this hospitalization (including imaging, microbiology, ancillary and laboratory) are listed below for reference.    Significant Diagnostic Studies: Dg Chest Port 1 View  02/19/2014   CLINICAL DATA:  Wheezing and difficulty breathing  EXAM: PORTABLE CHEST - 1 VIEW  COMPARISON:  June 29, 2013  FINDINGS: There is no edema or consolidation. Heart size and pulmonary vascularity are normal. No adenopathy. No bone lesions.  IMPRESSION: No edema or consolidation.   Electronically Signed   By: Lowella Grip M.D.   On: 02/19/2014 08:56    Microbiology: No results found for this or any previous visit (from the past 240 hour(s)).   Labs: Basic Metabolic Panel:  Recent Labs Lab 02/19/14 0830  NA 139  K 3.9  CL 99  CO2 27  GLUCOSE 187*  BUN 9  CREATININE 0.66  CALCIUM 9.8   Liver Function Tests: No results found for this basename: AST, ALT, ALKPHOS, BILITOT, PROT, ALBUMIN,  in the last 168 hours No results found for this basename: LIPASE, AMYLASE,  in the last 168 hours No results found for this basename: AMMONIA,  in the  last 168 hours CBC:  Recent Labs Lab 02/19/14 0830  WBC 11.5*  NEUTROABS 8.0*  HGB 13.5  HCT 40.0  MCV 87.7  PLT 197   Cardiac Enzymes: No results found for this basename: CKTOTAL, CKMB, CKMBINDEX, TROPONINI,  in the last 168 hours BNP: BNP (last 3 results)  Recent Labs  06/29/13 1700 02/19/14 0830  PROBNP 37.5 33.7   CBG:  Recent Labs Lab 02/19/14 1607 02/19/14 2042 02/20/14 0708 02/20/14 1144  GLUCAP 323* 262* 142* 222*       Signed:  Harsh Trulock A  [facility] 02/20/2014, 1:36 PM

## 2014-02-20 NOTE — Progress Notes (Signed)
Patient discharged home with instructions given on medications,and follow up visits,patient verbalized understanding.Prescriptions sent with patient.Vital signs stable.Accompanied by staff to an awaiting vehicle.

## 2014-03-01 ENCOUNTER — Other Ambulatory Visit: Payer: Self-pay | Admitting: Gastroenterology

## 2014-03-02 DIAGNOSIS — I10 Essential (primary) hypertension: Secondary | ICD-10-CM | POA: Diagnosis not present

## 2014-03-02 DIAGNOSIS — E119 Type 2 diabetes mellitus without complications: Secondary | ICD-10-CM | POA: Diagnosis not present

## 2014-03-02 DIAGNOSIS — K861 Other chronic pancreatitis: Secondary | ICD-10-CM | POA: Diagnosis not present

## 2014-03-02 DIAGNOSIS — J449 Chronic obstructive pulmonary disease, unspecified: Secondary | ICD-10-CM | POA: Diagnosis not present

## 2014-03-02 DIAGNOSIS — J45909 Unspecified asthma, uncomplicated: Secondary | ICD-10-CM | POA: Diagnosis not present

## 2014-03-02 DIAGNOSIS — K219 Gastro-esophageal reflux disease without esophagitis: Secondary | ICD-10-CM | POA: Diagnosis not present

## 2014-03-05 DIAGNOSIS — Z6828 Body mass index (BMI) 28.0-28.9, adult: Secondary | ICD-10-CM | POA: Diagnosis not present

## 2014-03-05 DIAGNOSIS — R109 Unspecified abdominal pain: Secondary | ICD-10-CM | POA: Diagnosis not present

## 2014-03-05 DIAGNOSIS — K861 Other chronic pancreatitis: Secondary | ICD-10-CM | POA: Diagnosis not present

## 2014-03-05 DIAGNOSIS — G8929 Other chronic pain: Secondary | ICD-10-CM | POA: Diagnosis not present

## 2014-03-05 DIAGNOSIS — J45909 Unspecified asthma, uncomplicated: Secondary | ICD-10-CM | POA: Diagnosis not present

## 2014-03-05 DIAGNOSIS — K5909 Other constipation: Secondary | ICD-10-CM | POA: Diagnosis not present

## 2014-03-05 DIAGNOSIS — K859 Acute pancreatitis without necrosis or infection, unspecified: Secondary | ICD-10-CM | POA: Diagnosis not present

## 2014-03-14 DIAGNOSIS — K5909 Other constipation: Secondary | ICD-10-CM | POA: Diagnosis not present

## 2014-03-14 DIAGNOSIS — K861 Other chronic pancreatitis: Secondary | ICD-10-CM | POA: Diagnosis not present

## 2014-03-14 DIAGNOSIS — G8929 Other chronic pain: Secondary | ICD-10-CM | POA: Diagnosis not present

## 2014-03-14 DIAGNOSIS — J45909 Unspecified asthma, uncomplicated: Secondary | ICD-10-CM | POA: Diagnosis not present

## 2014-03-27 ENCOUNTER — Encounter (HOSPITAL_COMMUNITY): Payer: Self-pay | Admitting: Emergency Medicine

## 2014-03-27 ENCOUNTER — Inpatient Hospital Stay (HOSPITAL_COMMUNITY)
Admission: EM | Admit: 2014-03-27 | Discharge: 2014-03-30 | DRG: 190 | Disposition: A | Discharge status: Home / Self Care | Payer: Medicare Other | Attending: Family Medicine | Admitting: Family Medicine

## 2014-03-27 ENCOUNTER — Emergency Department (HOSPITAL_COMMUNITY): Payer: Medicare Other

## 2014-03-27 DIAGNOSIS — E119 Type 2 diabetes mellitus without complications: Secondary | ICD-10-CM | POA: Diagnosis present

## 2014-03-27 DIAGNOSIS — E785 Hyperlipidemia, unspecified: Secondary | ICD-10-CM | POA: Diagnosis present

## 2014-03-27 DIAGNOSIS — R5381 Other malaise: Secondary | ICD-10-CM | POA: Diagnosis not present

## 2014-03-27 DIAGNOSIS — F17200 Nicotine dependence, unspecified, uncomplicated: Secondary | ICD-10-CM | POA: Diagnosis present

## 2014-03-27 DIAGNOSIS — K859 Acute pancreatitis without necrosis or infection, unspecified: Secondary | ICD-10-CM | POA: Diagnosis present

## 2014-03-27 DIAGNOSIS — J441 Chronic obstructive pulmonary disease with (acute) exacerbation: Secondary | ICD-10-CM | POA: Diagnosis present

## 2014-03-27 DIAGNOSIS — G8929 Other chronic pain: Secondary | ICD-10-CM | POA: Diagnosis present

## 2014-03-27 DIAGNOSIS — J45902 Unspecified asthma with status asthmaticus: Secondary | ICD-10-CM

## 2014-03-27 DIAGNOSIS — R0602 Shortness of breath: Secondary | ICD-10-CM | POA: Diagnosis not present

## 2014-03-27 DIAGNOSIS — J9600 Acute respiratory failure, unspecified whether with hypoxia or hypercapnia: Secondary | ICD-10-CM | POA: Diagnosis not present

## 2014-03-27 DIAGNOSIS — Z6827 Body mass index (BMI) 27.0-27.9, adult: Secondary | ICD-10-CM | POA: Diagnosis not present

## 2014-03-27 DIAGNOSIS — Z825 Family history of asthma and other chronic lower respiratory diseases: Secondary | ICD-10-CM

## 2014-03-27 DIAGNOSIS — I10 Essential (primary) hypertension: Secondary | ICD-10-CM | POA: Diagnosis present

## 2014-03-27 DIAGNOSIS — J189 Pneumonia, unspecified organism: Secondary | ICD-10-CM

## 2014-03-27 DIAGNOSIS — J449 Chronic obstructive pulmonary disease, unspecified: Secondary | ICD-10-CM | POA: Diagnosis not present

## 2014-03-27 DIAGNOSIS — Z720 Tobacco use: Secondary | ICD-10-CM | POA: Diagnosis present

## 2014-03-27 DIAGNOSIS — F319 Bipolar disorder, unspecified: Secondary | ICD-10-CM | POA: Diagnosis present

## 2014-03-27 DIAGNOSIS — K861 Other chronic pancreatitis: Secondary | ICD-10-CM | POA: Diagnosis present

## 2014-03-27 DIAGNOSIS — R339 Retention of urine, unspecified: Secondary | ICD-10-CM | POA: Diagnosis present

## 2014-03-27 DIAGNOSIS — M549 Dorsalgia, unspecified: Secondary | ICD-10-CM | POA: Diagnosis present

## 2014-03-27 DIAGNOSIS — R5383 Other fatigue: Secondary | ICD-10-CM | POA: Diagnosis not present

## 2014-03-27 DIAGNOSIS — N649 Disorder of breast, unspecified: Secondary | ICD-10-CM | POA: Diagnosis not present

## 2014-03-27 DIAGNOSIS — J9601 Acute respiratory failure with hypoxia: Secondary | ICD-10-CM | POA: Diagnosis present

## 2014-03-27 DIAGNOSIS — J45901 Unspecified asthma with (acute) exacerbation: Secondary | ICD-10-CM | POA: Diagnosis present

## 2014-03-27 DIAGNOSIS — F411 Generalized anxiety disorder: Secondary | ICD-10-CM | POA: Diagnosis present

## 2014-03-27 DIAGNOSIS — F3289 Other specified depressive episodes: Secondary | ICD-10-CM | POA: Diagnosis not present

## 2014-03-27 DIAGNOSIS — F329 Major depressive disorder, single episode, unspecified: Secondary | ICD-10-CM | POA: Diagnosis not present

## 2014-03-27 DIAGNOSIS — J45909 Unspecified asthma, uncomplicated: Secondary | ICD-10-CM | POA: Diagnosis not present

## 2014-03-27 LAB — CBC WITH DIFFERENTIAL/PLATELET
BASOS PCT: 1 % (ref 0–1)
Basophils Absolute: 0.1 10*3/uL (ref 0.0–0.1)
EOS ABS: 1.4 10*3/uL — AB (ref 0.0–0.7)
EOS PCT: 10 % — AB (ref 0–5)
HCT: 42.2 % (ref 36.0–46.0)
HEMOGLOBIN: 14.7 g/dL (ref 12.0–15.0)
LYMPHS ABS: 3.1 10*3/uL (ref 0.7–4.0)
Lymphocytes Relative: 24 % (ref 12–46)
MCH: 30.6 pg (ref 26.0–34.0)
MCHC: 34.8 g/dL (ref 30.0–36.0)
MCV: 87.7 fL (ref 78.0–100.0)
MONOS PCT: 6 % (ref 3–12)
Monocytes Absolute: 0.7 10*3/uL (ref 0.1–1.0)
Neutro Abs: 7.9 10*3/uL — ABNORMAL HIGH (ref 1.7–7.7)
Neutrophils Relative %: 59 % (ref 43–77)
Platelets: 308 10*3/uL (ref 150–400)
RBC: 4.81 MIL/uL (ref 3.87–5.11)
RDW: 12.5 % (ref 11.5–15.5)
WBC: 13.3 10*3/uL — ABNORMAL HIGH (ref 4.0–10.5)

## 2014-03-27 LAB — COMPREHENSIVE METABOLIC PANEL
ALBUMIN: 4.2 g/dL (ref 3.5–5.2)
ALK PHOS: 92 U/L (ref 39–117)
ALT: 23 U/L (ref 0–35)
AST: 20 U/L (ref 0–37)
Anion gap: 13 (ref 5–15)
BUN: 12 mg/dL (ref 6–23)
CALCIUM: 9.3 mg/dL (ref 8.4–10.5)
CO2: 25 mEq/L (ref 19–32)
CREATININE: 0.77 mg/dL (ref 0.50–1.10)
Chloride: 101 mEq/L (ref 96–112)
GFR calc non Af Amer: >90 mL/min (ref >90)
GLUCOSE: 162 mg/dL — AB (ref 70–99)
POTASSIUM: 3.8 meq/L (ref 3.7–5.3)
Sodium: 139 mEq/L (ref 137–147)
Total Bilirubin: 0.3 mg/dL (ref 0.3–1.2)
Total Protein: 7.5 g/dL (ref 6.0–8.3)

## 2014-03-27 LAB — BLOOD GAS, VENOUS
Acid-base deficit: 0.8 mmol/L (ref 0.0–2.0)
Bicarbonate: 23.5 mEq/L (ref 20.0–24.0)
O2 Saturation: 91.3 %
PCO2 VEN: 39.9 mmHg — AB (ref 45.0–50.0)
TCO2: 20.7 mmol/L (ref 0–100)
pH, Ven: 7.388 — ABNORMAL HIGH (ref 7.250–7.300)
pO2, Ven: 61.2 mmHg — ABNORMAL HIGH (ref 30.0–45.0)

## 2014-03-27 LAB — GLUCOSE, CAPILLARY: Glucose-Capillary: 274 mg/dL — ABNORMAL HIGH (ref 70–99)

## 2014-03-27 LAB — TROPONIN I: Troponin I: <0.3 ng/mL (ref <0.30)

## 2014-03-27 LAB — MRSA PCR SCREENING: MRSA by PCR: NEGATIVE

## 2014-03-27 LAB — PRO B NATRIURETIC PEPTIDE: PRO B NATRI PEPTIDE: 15 pg/mL (ref 0–125)

## 2014-03-27 MED ORDER — IPRATROPIUM BROMIDE 0.02 % IN SOLN
RESPIRATORY_TRACT | Status: AC
  Filled 2014-03-27: qty 2.5

## 2014-03-27 MED ORDER — HYDROXYZINE PAMOATE 25 MG PO CAPS
25.0000 mg | ORAL_CAPSULE | Freq: Four times a day (QID) | ORAL | Status: DC
  Administered 2014-03-27: 25 mg via ORAL
  Filled 2014-03-27 (×8): qty 1

## 2014-03-27 MED ORDER — PANCRELIPASE (LIP-PROT-AMYL) 12000-38000 UNITS PO CPEP
36000.0000 [IU] | ORAL_CAPSULE | Freq: Three times a day (TID) | ORAL | Status: DC
  Administered 2014-03-28 – 2014-03-30 (×8): 36000 [IU] via ORAL
  Filled 2014-03-27: qty 1
  Filled 2014-03-27: qty 3
  Filled 2014-03-27: qty 1
  Filled 2014-03-27 (×2): qty 3
  Filled 2014-03-27: qty 1
  Filled 2014-03-27: qty 3
  Filled 2014-03-27: qty 1
  Filled 2014-03-27: qty 3
  Filled 2014-03-27: qty 1
  Filled 2014-03-27 (×3): qty 3

## 2014-03-27 MED ORDER — CHLORHEXIDINE GLUCONATE 0.12 % MT SOLN
15.0000 mL | Freq: Two times a day (BID) | OROMUCOSAL | Status: DC
  Administered 2014-03-27 – 2014-03-30 (×6): 15 mL via OROMUCOSAL
  Filled 2014-03-27 (×6): qty 15

## 2014-03-27 MED ORDER — SODIUM CHLORIDE 0.9 % IJ SOLN
3.0000 mL | Freq: Two times a day (BID) | INTRAMUSCULAR | Status: DC
  Administered 2014-03-27 – 2014-03-30 (×6): 3 mL via INTRAVENOUS

## 2014-03-27 MED ORDER — DEXTROSE 5 % IV SOLN
2.0000 g | Freq: Once | INTRAVENOUS | Status: AC
  Administered 2014-03-27: 2 g via INTRAVENOUS
  Filled 2014-03-27: qty 2

## 2014-03-27 MED ORDER — LEVOFLOXACIN IN D5W 500 MG/100ML IV SOLN
INTRAVENOUS | Status: AC
  Filled 2014-03-27: qty 100

## 2014-03-27 MED ORDER — PANTOPRAZOLE SODIUM 40 MG PO TBEC
40.0000 mg | DELAYED_RELEASE_TABLET | Freq: Two times a day (BID) | ORAL | Status: DC
  Administered 2014-03-27 – 2014-03-30 (×6): 40 mg via ORAL
  Filled 2014-03-27 (×6): qty 1

## 2014-03-27 MED ORDER — METHYLPREDNISOLONE SODIUM SUCC 125 MG IJ SOLR
125.0000 mg | Freq: Once | INTRAMUSCULAR | Status: AC
  Administered 2014-03-27: 125 mg via INTRAVENOUS
  Filled 2014-03-27: qty 2

## 2014-03-27 MED ORDER — LORAZEPAM 0.5 MG PO TABS
0.5000 mg | ORAL_TABLET | Freq: Three times a day (TID) | ORAL | Status: DC | PRN
  Administered 2014-03-27 – 2014-03-30 (×5): 0.5 mg via ORAL
  Filled 2014-03-27 (×5): qty 1

## 2014-03-27 MED ORDER — ONDANSETRON HCL 4 MG PO TABS: 4.0000 mg | ORAL_TABLET | Freq: Four times a day (QID) | ORAL | Status: DC | PRN

## 2014-03-27 MED ORDER — MAGNESIUM SULFATE 40 MG/ML IJ SOLN
2.0000 g | Freq: Once | INTRAMUSCULAR | Status: AC
  Administered 2014-03-27: 2 g via INTRAVENOUS
  Filled 2014-03-27: qty 50

## 2014-03-27 MED ORDER — METFORMIN HCL 500 MG PO TABS
500.0000 mg | ORAL_TABLET | Freq: Two times a day (BID) | ORAL | Status: DC
  Administered 2014-03-28: 500 mg via ORAL
  Filled 2014-03-27: qty 1

## 2014-03-27 MED ORDER — METHYLPREDNISOLONE SODIUM SUCC 125 MG IJ SOLR
125.0000 mg | Freq: Four times a day (QID) | INTRAMUSCULAR | Status: DC
  Administered 2014-03-28 (×2): 125 mg via INTRAVENOUS
  Filled 2014-03-27 (×2): qty 2

## 2014-03-27 MED ORDER — LEVOFLOXACIN IN D5W 500 MG/100ML IV SOLN
500.0000 mg | INTRAVENOUS | Status: DC
  Administered 2014-03-27 – 2014-03-29 (×3): 500 mg via INTRAVENOUS
  Filled 2014-03-27 (×5): qty 100

## 2014-03-27 MED ORDER — OXYCODONE HCL 5 MG PO TABS
15.0000 mg | ORAL_TABLET | ORAL | Status: DC | PRN
Start: 2014-03-27 — End: 2014-03-30
  Administered 2014-03-27 – 2014-03-29 (×9): 15 mg via ORAL
  Filled 2014-03-27 (×9): qty 3

## 2014-03-27 MED ORDER — ALBUTEROL (5 MG/ML) CONTINUOUS INHALATION SOLN
15.0000 mg/h | INHALATION_SOLUTION | Freq: Once | RESPIRATORY_TRACT | Status: AC
  Administered 2014-03-27: 15 mg/h via RESPIRATORY_TRACT
  Filled 2014-03-27: qty 20

## 2014-03-27 MED ORDER — CETYLPYRIDINIUM CHLORIDE 0.05 % MT LIQD
7.0000 mL | Freq: Two times a day (BID) | OROMUCOSAL | Status: DC
  Administered 2014-03-28 – 2014-03-30 (×4): 7 mL via OROMUCOSAL

## 2014-03-27 MED ORDER — IPRATROPIUM BROMIDE 0.02 % IN SOLN
0.5000 mg | Freq: Once | RESPIRATORY_TRACT | Status: AC
  Administered 2014-03-27: 0.5 mg via RESPIRATORY_TRACT
  Filled 2014-03-27: qty 2.5

## 2014-03-27 MED ORDER — POLYETHYLENE GLYCOL 3350 17 GM/SCOOP PO POWD: 17.0000 g | Freq: Every day | ORAL | Status: DC | PRN

## 2014-03-27 MED ORDER — AMLODIPINE BESYLATE 5 MG PO TABS
10.0000 mg | ORAL_TABLET | Freq: Every day | ORAL | Status: DC
Start: 2014-03-27 — End: 2014-03-30
  Administered 2014-03-27 – 2014-03-29 (×3): 10 mg via ORAL
  Filled 2014-03-27 (×3): qty 2

## 2014-03-27 MED ORDER — INSULIN ASPART 100 UNIT/ML ~~LOC~~ SOLN
0.0000 [IU] | Freq: Three times a day (TID) | SUBCUTANEOUS | Status: DC
  Administered 2014-03-28: 7 [IU] via SUBCUTANEOUS
  Administered 2014-03-28 (×2): 4 [IU] via SUBCUTANEOUS
  Administered 2014-03-29 (×2): 7 [IU] via SUBCUTANEOUS
  Administered 2014-03-29: 11 [IU] via SUBCUTANEOUS
  Administered 2014-03-30: 7 [IU] via SUBCUTANEOUS
  Administered 2014-03-30: 4 [IU] via SUBCUTANEOUS

## 2014-03-27 MED ORDER — DOCUSATE SODIUM 100 MG PO CAPS
100.0000 mg | ORAL_CAPSULE | Freq: Every day | ORAL | Status: DC
  Administered 2014-03-27 – 2014-03-29 (×3): 100 mg via ORAL
  Filled 2014-03-27 (×5): qty 1

## 2014-03-27 MED ORDER — ONDANSETRON HCL 4 MG/2ML IJ SOLN: 4.0000 mg | Freq: Four times a day (QID) | INTRAMUSCULAR | Status: DC | PRN

## 2014-03-27 MED ORDER — PANCRELIPASE (LIP-PROT-AMYL) 12000-38000 UNITS PO CPEP
24000.0000 [IU] | ORAL_CAPSULE | ORAL | Status: DC | PRN
  Administered 2014-03-27: 24000 [IU] via ORAL
  Filled 2014-03-27: qty 2

## 2014-03-27 MED ORDER — FERROUS SULFATE 325 (65 FE) MG PO TABS
325.0000 mg | ORAL_TABLET | Freq: Every day | ORAL | Status: DC
  Administered 2014-03-28 – 2014-03-30 (×3): 325 mg via ORAL
  Filled 2014-03-27 (×5): qty 1

## 2014-03-27 MED ORDER — VANCOMYCIN HCL IN DEXTROSE 1-5 GM/200ML-% IV SOLN
1000.0000 mg | Freq: Once | INTRAVENOUS | Status: AC
  Administered 2014-03-27: 1000 mg via INTRAVENOUS
  Filled 2014-03-27: qty 200

## 2014-03-27 MED ORDER — INSULIN ASPART 100 UNIT/ML ~~LOC~~ SOLN
0.0000 [IU] | Freq: Every day | SUBCUTANEOUS | Status: DC
  Administered 2014-03-27: 3 [IU] via SUBCUTANEOUS

## 2014-03-27 MED ORDER — METHYLPREDNISOLONE SODIUM SUCC 125 MG IJ SOLR
125.0000 mg | Freq: Four times a day (QID) | INTRAMUSCULAR | Status: DC
Start: 2014-03-27 — End: 2014-03-27

## 2014-03-27 MED ORDER — SODIUM CHLORIDE 0.9 % IV SOLN
INTRAVENOUS | Status: DC
  Administered 2014-03-27: 21:00:00 via INTRAVENOUS

## 2014-03-27 MED ORDER — IPRATROPIUM-ALBUTEROL 0.5-2.5 (3) MG/3ML IN SOLN
3.0000 mL | Freq: Four times a day (QID) | RESPIRATORY_TRACT | Status: DC | PRN
  Administered 2014-03-27 – 2014-03-28 (×2): 3 mL via RESPIRATORY_TRACT
  Filled 2014-03-27 (×2): qty 3

## 2014-03-27 MED ORDER — AMITRIPTYLINE HCL 25 MG PO TABS
75.0000 mg | ORAL_TABLET | Freq: Every day | ORAL | Status: DC
  Administered 2014-03-27 – 2014-03-29 (×3): 75 mg via ORAL
  Filled 2014-03-27 (×5): qty 3

## 2014-03-27 MED ORDER — NICOTINE 14 MG/24HR TD PT24
14.0000 mg | MEDICATED_PATCH | Freq: Every day | TRANSDERMAL | Status: DC
  Administered 2014-03-27 – 2014-03-30 (×4): 14 mg via TRANSDERMAL
  Filled 2014-03-27 (×5): qty 1

## 2014-03-27 MED ORDER — HEPARIN SODIUM (PORCINE) 5000 UNIT/ML IJ SOLN
5000.0000 [IU] | Freq: Three times a day (TID) | INTRAMUSCULAR | Status: DC
Start: 2014-03-27 — End: 2014-03-30
  Administered 2014-03-27 – 2014-03-30 (×8): 5000 [IU] via SUBCUTANEOUS
  Filled 2014-03-27 (×8): qty 1

## 2014-03-27 MED ORDER — POLYETHYLENE GLYCOL 3350 17 G PO PACK
17.0000 g | PACK | Freq: Every day | ORAL | Status: DC | PRN
  Filled 2014-03-27: qty 1

## 2014-03-27 NOTE — ED Notes (Signed)
Paged Dr. Anastasio Champion for clarification of Bipap ordres, awaiting his call back

## 2014-03-27 NOTE — ED Notes (Signed)
Report given to [facility], all questions answered.

## 2014-03-27 NOTE — ED Notes (Signed)
Complain of being SOB since Friday. States she went to Dr. Gerarda Fraction today and was sent here for evaluation of being SOB

## 2014-03-27 NOTE — ED Provider Notes (Signed)
CSN: [id number]     Arrival date & time 03/27/14  1649 History  This chart was scribed for Janice Norrie, MD by Delphia Grates, ED Scribe. This patient was seen in room APA10/APA10 and the patient's care was started at 4:57 PM.    Chief Complaint  Patient presents with  . Shortness of Breath      The history is provided by the patient. No language interpreter was used.    HPI Comments: [name] is a 43 y.o. female who presents to the Emergency Department complaining of SOB that started a week after she was discharged from the hospital for an exacerbation of pneumonia about a month ago that became worse in the last 4 days. She states she was seen by her PCP (Dr. Gerarda Fraction) today, and was sent here for her SOB by EMS. Patient has history of asthma and was a  1 PPD smoker until recently quit smoking (2 weeks ago). She reports she has been coughing productive of brown/yellow sputum. There is associated wheezing, and  fever (100.2 F last night), fatigue, abdominal pain, and HA. She uses a nebulizer at home, but states this only provides temporary relief. She is having to use her nebulizer 5-6 times a night.     Patient is unemployed and is currently on disability. In addition to asthma, patient has history of depression, migraines, pancreatitis, bipolar 1 disorder, hyperlipidemia, DM, COPD, neck pain, anemia, anxiety, hiatal hernia, and esophagitis.  PCP: Dr. Gerarda Fraction  Past Medical History  Diagnosis Date  . Asthma   . Depression   . Migraine   . Pancreatitis chronic Idiopathic    Treated by Dr. Newman Pies at [facility] in the past with celiac blocks.  . Bipolar 1 disorder   . Hyperlipidemia 02/03/2012  . Diabetes mellitus   . COPD (chronic obstructive pulmonary disease)   . Neck pain 06/08/2012  . Chronic abdominal pain   . Anemia   . Anxiety   . Hiatal hernia   . Esophagitis    Past Surgical History  Procedure Laterality Date  . Cholecystectomy    . Tonsillectomy    .  Abdominal hysterectomy    . Esophagogastroduodenoscopy  02/20/2008    KGM:[id number] distal esophageal mucosa, suspicious for neoplasm/Hiatal hernia otherwise normal   . Esophagogastroduodenoscopy  04/03/2008    UYQ:[id number] hiatal hernia, otherwise normal/Short, tight, benign-appearing peptic stricture  . Ileocolonoscopy  06/05/2008      FIE:PPIRJJO anal canal, otherwise normal rectum, colon  . Esophagogastroduodenoscopy  November 16, 2012    Dr. Britta Mccreedy: hiatal hernia, esophagitis,   . Givens capsule study N/A 12/05/2012    Few superficial erosions but nothing found to explain iron deficiency anemia.   . Colonoscopy N/A 12/05/2012    ACZ:YSAYTK rectum, colon and terminal ileum  . Celiac plexus block     Family History  Problem Relation Age of Onset  . Asthma Other   . Coronary artery disease Neg Hx   . Colon cancer Neg Hx   . Asthma Paternal Grandfather    History  Substance Use Topics  . Smoking status: Current Every Day Smoker -- 1.00 packs/day for 15 years    Types: Cigarettes  . Smokeless tobacco: Former Systems developer  . Alcohol Use: No   On disability Lives with her mother and father  OB History   Grav Para Term Preterm Abortions TAB SAB Ect Mult Living                 Review  of Systems  Constitutional: Positive for fever and fatigue.  Respiratory: Positive for cough, shortness of breath and wheezing.   Gastrointestinal: Positive for abdominal pain.  Neurological: Positive for headaches.  All other systems reviewed and are negative.     Allergies  Penicillins; Sulfa antibiotics; and Prednisone  Home Medications   Prior to Admission medications   Medication Sig Start Date End Date Taking? Authorizing Provider  albuterol (PROVENTIL HFA;VENTOLIN HFA) 108 (90 BASE) MCG/ACT inhaler Inhale 2 puffs into the lungs daily as needed. For shortness of breath    Historical Provider, MD  amitriptyline (ELAVIL) 50 MG tablet Take 50 mg by mouth at bedtime.    Historical Provider,  MD  amLODipine (NORVASC) 10 MG tablet Take 10 mg by mouth at bedtime.     Historical Provider, MD  docusate sodium (COLACE) 100 MG capsule Take 100 mg by mouth at bedtime.     Historical Provider, MD  ferrous sulfate 325 (65 FE) MG tablet Take 325 mg by mouth daily.    Historical Provider, MD  Fluticasone-Salmeterol (ADVAIR DISKUS) 250-50 MCG/DOSE AEPB Inhale 1 puff into the lungs 2 (two) times daily. 07/01/13   Shanker Kristeen Mans, MD  hydrOXYzine (ATARAX/VISTARIL) 25 MG tablet Take 1 tablet (25 mg total) by mouth every 6 (six) hours as needed for anxiety. 02/27/13   Pamella Pert, MD  ipratropium-albuterol (DUONEB) 0.5-2.5 (3) MG/3ML SOLN Inhale 3 mLs into the lungs every 6 (six) hours as needed. Take 3 mLs by nebulization every 6 (six) hours as needed for Wheezing. 07/01/13   Shanker Kristeen Mans, MD  LORazepam (ATIVAN) 0.5 MG tablet Take 0.5 mg by mouth every 8 (eight) hours as needed for anxiety.     Historical Provider, MD  metFORMIN (GLUCOPHAGE) 500 MG tablet Take 500 mg by mouth daily. 07/01/13   Shanker Kristeen Mans, MD  nicotine (NICODERM CQ - DOSED IN MG/24 HOURS) 14 mg/24hr patch Place 1 patch (14 mg total) onto the skin daily. 02/20/14   Radene Gunning, NP  oxyCODONE (ROXICODONE) 15 MG immediate release tablet Take 15 mg by mouth every 4 (four) hours as needed for pain.    Historical Provider, MD  pantoprazole (PROTONIX) 40 MG tablet TAKE (1) TABLET BY MOUTH TWICE DAILY. 09/27/13   Orvil Feil, NP  polyethylene glycol powder (GLYCOLAX/MIRALAX) powder Take 17 g by mouth daily as needed. for Constipation.    Historical Provider, MD  predniSONE (STERAPRED UNI-PAK) 10 MG tablet Take by mouth daily. Take 6-5-4-3-2-1 PO daily till gone 02/20/14   Radene Gunning, NP  promethazine (PHENERGAN) 25 MG tablet TAKE (1) TABLET BY MOUTH EVERY SIX HOURS AS NEEDED FOR NAUSEA. 07/28/13   Mahala Menghini, PA-C  ZENPEP 20000 UNITS CPEP TAKE (3) CAPSULES BY MOUTH (3) TIMES DAILY WITH MEALS. 2 WITH SNACKS. 03/01/14   Orvil Feil,  NP   Triage Vitals: BP 137/92  Pulse 113  Temp(Src) 98.2 F (36.8 C) (Oral)  Resp 18  SpO2 95%  Vital signs normal except for tachycardia   Physical Exam  Nursing note and vitals reviewed. Constitutional: She is oriented to person, place, and time. She appears well-developed and well-nourished.  Non-toxic appearance. She does not appear ill. No distress.  HENT:  Head: Normocephalic and atraumatic.  Right Ear: External ear normal.  Left Ear: External ear normal.  Nose: Nose normal. No mucosal edema or rhinorrhea.  Mouth/Throat: Oropharynx is clear and moist and mucous membranes are normal. No dental abscesses or uvula swelling.  Tongue  is dry.  Eyes: Conjunctivae and EOM are normal. Pupils are equal, round, and reactive to light.  Neck: Normal range of motion and full passive range of motion without pain. Neck supple.  Cardiovascular: Normal rate, regular rhythm and normal heart sounds.  Exam reveals no gallop and no friction rub.   No murmur heard. Pulmonary/Chest: Accessory muscle usage present. Tachypnea noted. She is in respiratory distress. She has decreased breath sounds. She has wheezes. She has no rhonchi. She has no rales. She exhibits no tenderness and no crepitus.  Audible wheezing. Inspiratory and expiratory wheezing and retractions.  Abdominal: Soft. Normal appearance and bowel sounds are normal. She exhibits no distension. There is no tenderness. There is no rebound and no guarding.  Musculoskeletal: Normal range of motion. She exhibits no edema and no tenderness.  Moves all extremities well.   Neurological: She is alert and oriented to person, place, and time. She has normal strength. No cranial nerve deficit.  Skin: Skin is warm, dry and intact. No rash noted. No erythema. No pallor.  Psychiatric: She has a normal mood and affect. Her speech is normal and behavior is normal. Her mood appears not anxious.    ED Course  Procedures (including critical care  time)  Medications  magnesium sulfate IVPB 2 g 50 mL (not administered)  vancomycin (VANCOCIN) IVPB 1000 mg/200 mL premix (1,000 mg Intravenous New Bag/Given 03/27/14 1759)  aztreonam (AZACTAM) 2 g in dextrose 5 % 50 mL IVPB (not administered)  albuterol (PROVENTIL,VENTOLIN) solution continuous neb (15 mg/hr Nebulization Given 03/27/14 1725)  ipratropium (ATROVENT) nebulizer solution 0.5 mg (0.5 mg Nebulization Given 03/27/14 1725)  methylPREDNISolone sodium succinate (SOLU-MEDROL) 125 mg/2 mL injection 125 mg (125 mg Intravenous Given 03/27/14 1715)  ipratropium (ATROVENT) 0.02 % nebulizer solution (0 mg  Duplicate 3/0/07 6226)     DIAGNOSTIC STUDIES: Oxygen Saturation is 95% on room air, adequate by my interpretation.    COORDINATION OF CARE: At 47 Discussed treatment plan with patient which includes labs and CXR. Patient agreeable to being placed on BiPAP. Patient states she is tired from breathing hard for long time. BiPAP was started by respiratory therapy.   After reviewing her x-ray patient was started on healthcare associated pneumonia antibiotics.  5:56 PM- Upon recheck pt on Bipap and getting her continuous nebulizer, improved air movement but still has inspiratory and expiratory wheezing. Discussed the results of her CXR and labs. Pt advised of plan for admission and pt agrees.  6:15 PM- Consulted with Dr. Anastasio Champion, and will admit patient to stepdown. Team 1  Labs Review Results for orders placed during the hospital encounter of 03/27/14  CBC WITH DIFFERENTIAL      Result Value Ref Range   WBC 13.3 (*) 4.0 - 10.5 K/uL   RBC 4.81  3.87 - 5.11 MIL/uL   Hemoglobin 14.7  12.0 - 15.0 g/dL   HCT 42.2  36.0 - 46.0 %   MCV 87.7  78.0 - 100.0 fL   MCH 30.6  26.0 - 34.0 pg   MCHC 34.8  30.0 - 36.0 g/dL   RDW 12.5  11.5 - 15.5 %   Platelets 308  150 - 400 K/uL   Neutrophils Relative % 59  43 - 77 %   Neutro Abs 7.9 (*) 1.7 - 7.7 K/uL   Lymphocytes Relative 24  12 - 46 %   Lymphs Abs  3.1  0.7 - 4.0 K/uL   Monocytes Relative 6  3 - 12 %   Monocytes  Absolute 0.7  0.1 - 1.0 K/uL   Eosinophils Relative 10 (*) 0 - 5 %   Eosinophils Absolute 1.4 (*) 0.0 - 0.7 K/uL   Basophils Relative 1  0 - 1 %   Basophils Absolute 0.1  0.0 - 0.1 K/uL  COMPREHENSIVE METABOLIC PANEL      Result Value Ref Range   Sodium 139  137 - 147 mEq/L   Potassium 3.8  3.7 - 5.3 mEq/L   Chloride 101  96 - 112 mEq/L   CO2 25  19 - 32 mEq/L   Glucose, Bld 162 (*) 70 - 99 mg/dL   BUN 12  6 - 23 mg/dL   Creatinine, Ser 0.77  0.50 - 1.10 mg/dL   Calcium 9.3  8.4 - 10.5 mg/dL   Total Protein 7.5  6.0 - 8.3 g/dL   Albumin 4.2  3.5 - 5.2 g/dL   AST 20  0 - 37 U/L   ALT 23  0 - 35 U/L   Alkaline Phosphatase 92  39 - 117 U/L   Total Bilirubin 0.3  0.3 - 1.2 mg/dL   GFR calc non Af Amer >90  >90 mL/min   GFR calc Af Amer >90  >90 mL/min   Anion gap 13  5 - 15  TROPONIN I      Result Value Ref Range   Troponin I <0.30  <0.30 ng/mL  PRO B NATRIURETIC PEPTIDE      Result Value Ref Range   Pro B Natriuretic peptide (BNP) 15.0  0 - 125 pg/mL  BLOOD GAS, VENOUS      Result Value Ref Range   pH, Ven 7.388 (*) 7.250 - 7.300   pCO2, Ven 39.9 (*) 45.0 - 50.0 mmHg   pO2, Ven 61.2 (*) 30.0 - 45.0 mmHg   Bicarbonate 23.5  20.0 - 24.0 mEq/L   TCO2 20.7  0 - 100 mmol/L   Acid-base deficit 0.8  0.0 - 2.0 mmol/L   O2 Saturation 91.3     Drawn by [place]     Sample type VENOUS      Laboratory interpretation all normal except for leukocytosis, VBG does not show acute respiratory failure     Imaging Review Dg Chest Portable 1 View  03/27/2014   CLINICAL DATA:  Shortness of breath  EXAM: PORTABLE CHEST - 1 VIEW  COMPARISON:  Chest radiograph 02/19/2014 and multiple additional priors.  FINDINGS: Stable cardiac and mediastinal contours. Suggestion of focal consolidative opacity within the right upper lung. No pleural effusion or pneumothorax. Regional skeleton is unremarkable.  IMPRESSION: Suggestion  of possible consolidative opacity within the right upper lung versus artifact from overlying structures. Recommend short term radiographic followup to evaluate for interval change/ resolution to exclude the possibility of underlying mass.   Electronically Signed   By: Lovey Newcomer M.D.   On: 03/27/2014 17:28     EKG Interpretation   Date/Time:  Tuesday March 27 2014 16:54:40 EDT Ventricular Rate:  116 PR Interval:  116 QRS Duration: 115 QT Interval:  338 QTC Calculation: 469 R Axis:   93 Text Interpretation:  Sinus tachycardia RBBB and LPFB Low voltage,  precordial leads No significant change since last tracing 19 Feb 2014  Confirmed by Takima Encina  MD-I, Rylen Hou ([id number]) on 03/27/2014 5:48:52 PM      MDM   Final diagnoses:  Healthcare-associated pneumonia  Status asthmaticus, unspecified asthma severity   Plan admission   CRITICAL CARE Performed by: Jane Broughton L Total critical  care time: 40 min Critical care time was exclusive of separately billable procedures and treating other patients. Critical care was necessary to treat or prevent imminent or life-threatening deterioration. Critical care was time spent personally by me on the following activities: development of treatment plan with patient and/or surrogate as well as nursing, discussions with consultants, evaluation of patient's response to treatment, examination of patient, obtaining history from patient or surrogate, ordering and performing treatments and interventions, ordering and review of laboratory studies, ordering and review of radiographic studies, pulse oximetry and re-evaluation of patient's condition.    I personally performed the services described in this documentation, which was scribed in my presence. The recorded information has been reviewed and considered.  Rolland Porter, MD, FACEP    Janice Norrie, MD 03/27/14 [phone]

## 2014-03-27 NOTE — Progress Notes (Signed)
Pt taken off BIPAP per DR. Gosrani  Placed on 3lpm cann hr 144 rr 18 spo2 98%

## 2014-03-27 NOTE — H&P (Signed)
[facility] History and Physical  [name] NLZ:[id number] DOB: 01-16-1971 DOA: 03/27/2014  Referring physician: ER. PCP: Glo Herring., MD   Chief Complaint: Dyspnea.  HPI: [name] is a 43 y.o. female  This is a 43 year old lady who complains of dyspnea but has been going on for about a week. She has a cough productive of brown sputum. She also had a fever subjectively last night. Evaluation in the emergency room showed that she had difficulty with breathing and BiPAP was instituted. Her breathing seems to be more comfortable now. She has a history of COPD. She is now being admitted for further management.   Review of Systems:  Constitutional:  No weight loss, night sweats, Fevers, chills, fatigue.  HEENT:  No headaches, Difficulty swallowing,Tooth/dental problems,Sore throat,  No sneezing, itching, ear ache, nasal congestion, post nasal drip,  Cardio-vascular:  No chest pain, Orthopnea, PND, swelling in lower extremities, anasarca, dizziness, palpitations  GI:  No heartburn, indigestion, abdominal pain, nausea, vomiting, diarrhea, change in bowel habits, loss of appetite   Skin:  no rash or lesions.  GU:  no dysuria, change in color of urine, no urgency or frequency. No flank pain.  Musculoskeletal:  No joint pain or swelling. No decreased range of motion. No back pain.  Psych:  No change in mood or affect. No depression or anxiety. No memory loss.   Past Medical History  Diagnosis Date  . Asthma   . Depression   . Migraine   . Pancreatitis chronic Idiopathic    Treated by Dr. Newman Pies at [facility] in the past with celiac blocks.  . Bipolar 1 disorder   . Hyperlipidemia 02/03/2012  . Diabetes mellitus   . COPD (chronic obstructive pulmonary disease)   . Neck pain 06/08/2012  . Chronic abdominal pain   . Anemia   . Anxiety   . Hiatal hernia   . Esophagitis    Past Surgical History  Procedure Laterality Date  . Cholecystectomy    .  Tonsillectomy    . Abdominal hysterectomy    . Esophagogastroduodenoscopy  02/20/2008    TKW:[id number] distal esophageal mucosa, suspicious for neoplasm/Hiatal hernia otherwise normal   . Esophagogastroduodenoscopy  04/03/2008    JME:[id number] hiatal hernia, otherwise normal/Short, tight, benign-appearing peptic stricture  . Ileocolonoscopy  06/05/2008      LGX:QJJHERD anal canal, otherwise normal rectum, colon  . Esophagogastroduodenoscopy  November 16, 2012    Dr. Britta Mccreedy: hiatal hernia, esophagitis,   . Givens capsule study N/A 12/05/2012    Few superficial erosions but nothing found to explain iron deficiency anemia.   . Colonoscopy N/A 12/05/2012    EYC:XKGYJE rectum, colon and terminal ileum  . Celiac plexus block     Social History:  reports that she has been smoking Cigarettes.  She has a 15 pack-year smoking history. She has quit using smokeless tobacco. She reports that she does not drink alcohol or use illicit drugs.  Allergies  Allergen Reactions  . Penicillins Shortness Of Breath       . Sulfa Antibiotics Shortness Of Breath  . Prednisone Other (See Comments)    Pancreatitis, but has to take for asthma sometimes    Family History  Problem Relation Age of Onset  . Asthma Other   . Coronary artery disease Neg Hx   . Colon cancer Neg Hx   . Asthma Paternal Grandfather      Prior to Admission medications   Medication Sig Start Date End Date Taking? Authorizing Provider  albuterol (PROVENTIL HFA;VENTOLIN HFA) 108 (90 BASE) MCG/ACT inhaler Inhale 2 puffs into the lungs daily as needed. For shortness of breath   Yes Historical Provider, MD  amitriptyline (ELAVIL) 75 MG tablet Take 75 mg by mouth at bedtime.   Yes Historical Provider, MD  amLODipine (NORVASC) 10 MG tablet Take 10 mg by mouth at bedtime.    Yes Historical Provider, MD  docusate sodium (COLACE) 100 MG capsule Take 100 mg by mouth at bedtime.    Yes Historical Provider, MD  ferrous sulfate 325 (65 FE) MG  tablet Take 325 mg by mouth daily.   Yes Historical Provider, MD  hydrOXYzine (VISTARIL) 25 MG capsule Take 25 mg by mouth 4 (four) times daily.   Yes Historical Provider, MD  ipratropium-albuterol (DUONEB) 0.5-2.5 (3) MG/3ML SOLN Inhale 3 mLs into the lungs every 6 (six) hours as needed. Take 3 mLs by nebulization every 6 (six) hours as needed for Wheezing. 07/01/13  Yes Shanker Kristeen Mans, MD  LORazepam (ATIVAN) 0.5 MG tablet Take 0.5 mg by mouth every 8 (eight) hours as needed for anxiety.    Yes Historical Provider, MD  metFORMIN (GLUCOPHAGE) 500 MG tablet Take 500 mg by mouth 2 (two) times daily with a meal.  07/01/13  Yes Shanker Kristeen Mans, MD  nicotine (NICODERM CQ - DOSED IN MG/24 HOURS) 14 mg/24hr patch Place 1 patch (14 mg total) onto the skin daily. 02/20/14  Yes Lezlie Octave Black, NP  oxyCODONE (ROXICODONE) 15 MG immediate release tablet Take 15 mg by mouth every 4 (four) hours as needed for pain.   Yes Historical Provider, MD  Pancrelipase, Lip-Prot-Amyl, (ZENPEP) 20000 UNITS CPEP Take 2-3 capsules by mouth 5 (five) times daily. 3 capsules three times daily with each meal and 2 capsules with each snack.   Yes Historical Provider, MD  pantoprazole (PROTONIX) 40 MG tablet Take 40 mg by mouth 2 (two) times daily.   Yes Historical Provider, MD  polyethylene glycol powder (GLYCOLAX/MIRALAX) powder Take 17 g by mouth daily as needed. for Constipation.   Yes Historical Provider, MD  promethazine (PHENERGAN) 25 MG tablet TAKE (1) TABLET BY MOUTH EVERY SIX HOURS AS NEEDED FOR NAUSEA. 07/28/13  Yes Mahala Menghini, PA-C   Physical Exam: Filed Vitals:   03/27/14 1800 03/27/14 1815 03/27/14 1830 03/27/14 1900  BP: 134/89  143/83 137/84  Pulse: 109 107 107 107  Temp:      TempSrc:      Resp: 11 10 11 16   SpO2: 100% 99% 100% 99%    Wt Readings from Last 3 Encounters:  02/19/14 72.9 kg (160 lb 11.5 oz)  06/29/13 71 kg (156 lb 8.4 oz)  04/16/13 73.936 kg (163 lb)    General:  Appears calm and  comfortable on BiPAP. No central peripheral cyanosis. Eyes: PERRL, normal lids, irises & conjunctiva ENT: grossly normal hearing, lips & tongue Neck: no LAD, masses or thyromegaly Cardiovascular: RRR, no m/r/g. No LE edema. Telemetry: SR, no arrhythmias  Respiratory: CTA bilaterally, no w/r/r. Normal respiratory effort. In particular, there is no wheezing at the present time. Abdomen: soft, ntnd Skin: no rash or induration seen on limited exam Musculoskeletal: grossly normal tone BUE/BLE Psychiatric: grossly normal mood and affect, speech fluent and appropriate Neurologic: grossly non-focal.          Labs on Admission:  Basic Metabolic Panel:  Recent Labs Lab 03/27/14 1719  NA 139  K 3.8  CL 101  CO2 25  GLUCOSE 162*  BUN 12  CREATININE 0.77  CALCIUM 9.3   Liver Function Tests:  Recent Labs Lab 03/27/14 1719  AST 20  ALT 23  ALKPHOS 92  BILITOT 0.3  PROT 7.5  ALBUMIN 4.2   No results found for this basename: LIPASE, AMYLASE,  in the last 168 hours No results found for this basename: AMMONIA,  in the last 168 hours CBC:  Recent Labs Lab 03/27/14 1719  WBC 13.3*  NEUTROABS 7.9*  HGB 14.7  HCT 42.2  MCV 87.7  PLT 308   Cardiac Enzymes:  Recent Labs Lab 03/27/14 1719  TROPONINI <0.30    BNP (last 3 results)  Recent Labs  06/29/13 1700 02/19/14 0830 03/27/14 1719  PROBNP 37.5 33.7 15.0   CBG: No results found for this basename: GLUCAP,  in the last 168 hours  Radiological Exams on Admission: Dg Chest Portable 1 View  03/27/2014   CLINICAL DATA:  Shortness of breath  EXAM: PORTABLE CHEST - 1 VIEW  COMPARISON:  Chest radiograph 02/19/2014 and multiple additional priors.  FINDINGS: Stable cardiac and mediastinal contours. Suggestion of focal consolidative opacity within the right upper lung. No pleural effusion or pneumothorax. Regional skeleton is unremarkable.  IMPRESSION: Suggestion of possible consolidative opacity within the right upper lung  versus artifact from overlying structures. Recommend short term radiographic followup to evaluate for interval change/ resolution to exclude the possibility of underlying mass.   Electronically Signed   By: Lovey Newcomer M.D.   On: 03/27/2014 17:28      Assessment/Plan   1. COPD exacerbation. 2. Ongoing tobacco abuse. 3. Hypertension. 4. Diabetes mellitus.  Plan: 1. Admit to step down unit. 2. Try to wean off BiPAP as her breathing seems much more comfortable. 3. IV steroids. 4. Intravenous antibiotics.  Other recommendations will depend on patient's hospital progress.  Code Status: Full code.  DVT Prophylaxis: Heparin.  Family Communication: I discussed the plan with patient at the bedside.   Disposition Plan: Home when medically stable.  Time spent: 60 minutes.  Doree Albee [facility] Pager [phone].  **Disclaimer: This note may have been dictated with voice recognition software. Similar sounding words can inadvertently be transcribed and this note may contain transcription errors which may not have been corrected upon publication of note.**

## 2014-03-27 NOTE — Progress Notes (Signed)
Patient transferred from ED, patient was on BIPAP in the emergency room-states she feels much better at this time-RT is bringing bipap up incase she needs to go back on. RT will continue to monitor.

## 2014-03-27 NOTE — Progress Notes (Signed)
Patient felt like it was getting harder for her to catch her breath-placed back on bipap 10/5 35% and nebulizer given, patient seems to be resting comfortably. RT will continue to monitor.

## 2014-03-28 DIAGNOSIS — J45901 Unspecified asthma with (acute) exacerbation: Secondary | ICD-10-CM

## 2014-03-28 DIAGNOSIS — E119 Type 2 diabetes mellitus without complications: Secondary | ICD-10-CM

## 2014-03-28 DIAGNOSIS — J9600 Acute respiratory failure, unspecified whether with hypoxia or hypercapnia: Secondary | ICD-10-CM

## 2014-03-28 LAB — GLUCOSE, CAPILLARY
GLUCOSE-CAPILLARY: 208 mg/dL — AB (ref 70–99)
GLUCOSE-CAPILLARY: 199 mg/dL — AB (ref 70–99)
Glucose-Capillary: 188 mg/dL — ABNORMAL HIGH (ref 70–99)
Glucose-Capillary: 173 mg/dL — ABNORMAL HIGH (ref 70–99)

## 2014-03-28 LAB — COMPREHENSIVE METABOLIC PANEL
ALK PHOS: 84 U/L (ref 39–117)
ALT: 18 U/L (ref 0–35)
AST: 12 U/L (ref 0–37)
Albumin: 3.6 g/dL (ref 3.5–5.2)
Anion gap: 13 (ref 5–15)
BILIRUBIN TOTAL: 0.3 mg/dL (ref 0.3–1.2)
BUN: 12 mg/dL (ref 6–23)
CHLORIDE: 103 meq/L (ref 96–112)
CO2: 22 meq/L (ref 19–32)
Calcium: 8.6 mg/dL (ref 8.4–10.5)
Creatinine, Ser: 0.61 mg/dL (ref 0.50–1.10)
GFR calc Af Amer: >90 mL/min (ref >90)
GFR calc non Af Amer: >90 mL/min (ref >90)
Glucose, Bld: 210 mg/dL — ABNORMAL HIGH (ref 70–99)
Potassium: 4.4 mEq/L (ref 3.7–5.3)
SODIUM: 138 meq/L (ref 137–147)
Total Protein: 6.6 g/dL (ref 6.0–8.3)

## 2014-03-28 LAB — CBC
HCT: 37.7 % (ref 36.0–46.0)
Hemoglobin: 13.2 g/dL (ref 12.0–15.0)
MCH: 30.9 pg (ref 26.0–34.0)
MCHC: 35 g/dL (ref 30.0–36.0)
MCV: 88.3 fL (ref 78.0–100.0)
Platelets: 263 10*3/uL (ref 150–400)
RBC: 4.27 MIL/uL (ref 3.87–5.11)
RDW: 12.5 % (ref 11.5–15.5)
WBC: 11.6 10*3/uL — ABNORMAL HIGH (ref 4.0–10.5)

## 2014-03-28 LAB — TSH: TSH: 3.29 u[IU]/mL (ref 0.350–4.500)

## 2014-03-28 LAB — TROPONIN I: Troponin I: <0.3 ng/mL (ref <0.30)

## 2014-03-28 MED ORDER — HYDROXYZINE HCL 25 MG PO TABS
25.0000 mg | ORAL_TABLET | Freq: Four times a day (QID) | ORAL | Status: DC
  Administered 2014-03-28 – 2014-03-30 (×10): 25 mg via ORAL
  Filled 2014-03-28 (×10): qty 1

## 2014-03-28 MED ORDER — ALBUTEROL SULFATE (2.5 MG/3ML) 0.083% IN NEBU
2.5000 mg | INHALATION_SOLUTION | RESPIRATORY_TRACT | Status: DC | PRN
  Administered 2014-03-29: 2.5 mg via RESPIRATORY_TRACT
  Filled 2014-03-28: qty 3

## 2014-03-28 MED ORDER — METHYLPREDNISOLONE SODIUM SUCC 40 MG IJ SOLR
40.0000 mg | Freq: Four times a day (QID) | INTRAMUSCULAR | Status: DC
  Administered 2014-03-28 – 2014-03-29 (×5): 40 mg via INTRAVENOUS
  Filled 2014-03-28 (×5): qty 1

## 2014-03-28 MED ORDER — IPRATROPIUM-ALBUTEROL 0.5-2.5 (3) MG/3ML IN SOLN
3.0000 mL | Freq: Four times a day (QID) | RESPIRATORY_TRACT | Status: DC
  Administered 2014-03-28 – 2014-03-29 (×5): 3 mL via RESPIRATORY_TRACT
  Filled 2014-03-28 (×6): qty 3

## 2014-03-28 MED ORDER — POLYETHYLENE GLYCOL 3350 17 G PO PACK
17.0000 g | PACK | Freq: Two times a day (BID) | ORAL | Status: DC
  Administered 2014-03-28 – 2014-03-30 (×5): 17 g via ORAL
  Filled 2014-03-28 (×4): qty 1

## 2014-03-28 NOTE — Consult Note (Signed)
Consult requested by: Dr. Sarajane Jews Consult requested for respiratory failure:  HPI: This is a 43 year old who has come to the emergency department because of shortness of breath. She says she was hospitalized approximately a month ago and improved but never felt like she totally gone over her exacerbation of COPD at that point. She continued having shortness of breath cough congestion and came to the emergency department for evaluation and was admitted from there. She required BiPAP in the emergency department. She was admitted to step down unit and has required BiPAP some more during the night last night. She says she feels much better this morning  Past Medical History  Diagnosis Date  . Asthma   . Depression   . Migraine   . Pancreatitis chronic Idiopathic    Treated by Dr. Newman Pies at [facility] in the past with celiac blocks.  . Bipolar 1 disorder   . Hyperlipidemia 02/03/2012  . Diabetes mellitus   . COPD (chronic obstructive pulmonary disease)   . Neck pain 06/08/2012  . Chronic abdominal pain   . Anemia   . Anxiety   . Hiatal hernia   . Esophagitis      Family History  Problem Relation Age of Onset  . Asthma Other   . Coronary artery disease Neg Hx   . Colon cancer Neg Hx   . Asthma Paternal Grandfather      History   Social History  . Marital Status: Legally Separated    Spouse Name: N/A    Number of Children: N/A  . Years of Education: N/A   Social History Main Topics  . Smoking status: Former Smoker -- 1.00 packs/day for 15 years    Types: Cigarettes  . Smokeless tobacco: Former Systems developer  . Alcohol Use: No  . Drug Use: No  . Sexual Activity: Yes    Birth Control/ Protection: Surgical   Other Topics Concern  . None   Social History Narrative  . None     ROS: She says she's been coughing up sputum at home. She's not had fever or chills. No nausea or vomiting. She is having some abdominal discomfort and apparently ais chronic pancreatitis. Otherwise  review of systems is negative    Objective: Vital signs in last 24 hours: Temp:  [97.4 F (36.3 C)-98.4 F (36.9 C)] 98.2 F (36.8 C) (09/02 0740) Pulse Rate:  [102-113] 103 (09/02 0800) Resp:  [9-28] 13 (09/02 0800) BP: (90-143)/(47-102) 106/72 mmHg (09/02 0800) SpO2:  [94 %-100 %] 95 % (09/02 0800) FiO2 (%):  [32 %-40 %] 32 % (09/02 0740) Weight:  [70.6 kg (155 lb 10.3 oz)] 70.6 kg (155 lb 10.3 oz) (09/02 0500) Weight change:  Last BM Date: 03/25/14  Intake/Output from previous day: 09/01 0701 - 09/02 0700 In: 847.5 [I.V.:697.5; IV Piggyback:150] Out: 1500 [Urine:1500]  PHYSICAL EXAM She is awake and alert and does not appear to be in any acute distress now. HEENT examination is generally unremarkable. Her nose and throat are clear. Her chest shows diminished breath sounds and prolonged expiration but no wheezing now. Her heart is regular without gallop. Her abdomen is soft without masses. Extremities showed no edema. Central nervous system examination is grossly intact  Lab Results: Basic Metabolic Panel:  Recent Labs  03/27/14 1719 03/28/14 0453  NA 139 138  K 3.8 4.4  CL 101 103  CO2 25 22  GLUCOSE 162* 210*  BUN 12 12  CREATININE 0.77 0.61  CALCIUM 9.3 8.6   Liver Function  Tests:  Recent Labs  03/27/14 1719 03/28/14 0453  AST 20 12  ALT 23 18  ALKPHOS 92 84  BILITOT 0.3 0.3  PROT 7.5 6.6  ALBUMIN 4.2 3.6   No results found for this basename: LIPASE, AMYLASE,  in the last 72 hours No results found for this basename: AMMONIA,  in the last 72 hours CBC:  Recent Labs  03/27/14 1719 03/28/14 0453  WBC 13.3* 11.6*  NEUTROABS 7.9*  --   HGB 14.7 13.2  HCT 42.2 37.7  MCV 87.7 88.3  PLT 308 263   Cardiac Enzymes:  Recent Labs  03/27/14 1719 03/27/14 2245 03/28/14 0453  TROPONINI <0.30 <0.30 <0.30   BNP:  Recent Labs  03/27/14 1719  PROBNP 15.0   D-Dimer: No results found for this basename: DDIMER,  in the last 72  hours CBG:  Recent Labs  03/27/14 2113 03/28/14 0722  GLUCAP 274* 188*   Hemoglobin A1C: No results found for this basename: HGBA1C,  in the last 72 hours Fasting Lipid Panel: No results found for this basename: CHOL, HDL, LDLCALC, TRIG, CHOLHDL, LDLDIRECT,  in the last 72 hours Thyroid Function Tests: No results found for this basename: TSH, T4TOTAL, FREET4, T3FREE, THYROIDAB,  in the last 72 hours Anemia Panel: No results found for this basename: VITAMINB12, FOLATE, FERRITIN, TIBC, IRON, RETICCTPCT,  in the last 72 hours Coagulation: No results found for this basename: LABPROT, INR,  in the last 72 hours Urine Drug Screen: Drugs of Abuse     Component Value Date/Time   LABOPIA POSITIVE* 08/29/2011 1202   COCAINSCRNUR NONE DETECTED 08/29/2011 1202   LABBENZ POSITIVE* 08/29/2011 1202   AMPHETMU NONE DETECTED 08/29/2011 1202   THCU POSITIVE* 08/29/2011 1202   LABBARB NONE DETECTED 08/29/2011 1202    Alcohol Level: No results found for this basename: ETH,  in the last 72 hours Urinalysis: No results found for this basename: COLORURINE, APPERANCEUR, LABSPEC, PHURINE, GLUCOSEU, HGBUR, BILIRUBINUR, KETONESUR, PROTEINUR, UROBILINOGEN, NITRITE, LEUKOCYTESUR,  in the last 72 hours Misc. Labs:   ABGS:  Recent Labs  03/27/14 1704  TCO2 20.7  HCO3 23.5     MICROBIOLOGY: Recent Results (from the past 240 hour(s))  MRSA PCR SCREENING     Status: None   Collection Time    03/27/14  9:13 PM      Result Value Ref Range Status   MRSA by PCR NEGATIVE  NEGATIVE Final   Comment:            The GeneXpert MRSA Assay (FDA     approved for NASAL specimens     only), is one component of a     comprehensive MRSA colonization     surveillance program. It is not     intended to diagnose MRSA     infection nor to guide or     monitor treatment for     MRSA infections.    Studies/Results: Dg Chest Portable 1 View  03/27/2014   CLINICAL DATA:  Shortness of breath  EXAM: PORTABLE CHEST - 1  VIEW  COMPARISON:  Chest radiograph 02/19/2014 and multiple additional priors.  FINDINGS: Stable cardiac and mediastinal contours. Suggestion of focal consolidative opacity within the right upper lung. No pleural effusion or pneumothorax. Regional skeleton is unremarkable.  IMPRESSION: Suggestion of possible consolidative opacity within the right upper lung versus artifact from overlying structures. Recommend short term radiographic followup to evaluate for interval change/ resolution to exclude the possibility of underlying mass.   Electronically Signed  By: Lovey Newcomer M.D.   On: 03/27/2014 17:28    Medications:  Prior to Admission:  Prescriptions prior to admission  Medication Sig Dispense Refill  . albuterol (PROVENTIL HFA;VENTOLIN HFA) 108 (90 BASE) MCG/ACT inhaler Inhale 2 puffs into the lungs daily as needed. For shortness of breath      . amitriptyline (ELAVIL) 75 MG tablet Take 75 mg by mouth at bedtime.      Marland Kitchen amLODipine (NORVASC) 10 MG tablet Take 10 mg by mouth at bedtime.       . docusate sodium (COLACE) 100 MG capsule Take 100 mg by mouth at bedtime.       . ferrous sulfate 325 (65 FE) MG tablet Take 325 mg by mouth daily.      . hydrOXYzine (VISTARIL) 25 MG capsule Take 25 mg by mouth 4 (four) times daily.      Marland Kitchen ipratropium-albuterol (DUONEB) 0.5-2.5 (3) MG/3ML SOLN Inhale 3 mLs into the lungs every 6 (six) hours as needed. Take 3 mLs by nebulization every 6 (six) hours as needed for Wheezing.  360 mL  0  . LORazepam (ATIVAN) 0.5 MG tablet Take 0.5 mg by mouth every 8 (eight) hours as needed for anxiety.       . metFORMIN (GLUCOPHAGE) 500 MG tablet Take 500 mg by mouth 2 (two) times daily with a meal.       . nicotine (NICODERM CQ - DOSED IN MG/24 HOURS) 14 mg/24hr patch Place 1 patch (14 mg total) onto the skin daily.  28 patch  0  . oxyCODONE (ROXICODONE) 15 MG immediate release tablet Take 15 mg by mouth every 4 (four) hours as needed for pain.      . Pancrelipase,  Lip-Prot-Amyl, (ZENPEP) 20000 UNITS CPEP Take 2-3 capsules by mouth 5 (five) times daily. 3 capsules three times daily with each meal and 2 capsules with each snack.      . pantoprazole (PROTONIX) 40 MG tablet Take 40 mg by mouth 2 (two) times daily.      . polyethylene glycol powder (GLYCOLAX/MIRALAX) powder Take 17 g by mouth daily as needed. for Constipation.      . promethazine (PHENERGAN) 25 MG tablet TAKE (1) TABLET BY MOUTH EVERY SIX HOURS AS NEEDED FOR NAUSEA.  30 tablet  0   Scheduled: . amitriptyline  75 mg Oral QHS  . amLODipine  10 mg Oral QHS  . antiseptic oral rinse  7 mL Mouth Rinse q12n4p  . chlorhexidine  15 mL Mouth Rinse BID  . docusate sodium  100 mg Oral QHS  . ferrous sulfate  325 mg Oral Daily  . heparin  5,000 Units Subcutaneous 3 times per day  . hydrOXYzine  25 mg Oral QID  . insulin aspart  0-20 Units Subcutaneous TID WC  . insulin aspart  0-5 Units Subcutaneous QHS  . levofloxacin (LEVAQUIN) IV  500 mg Intravenous Q24H  . lipase/protease/amylase  36,000 Units Oral TID AC  . metFORMIN  500 mg Oral BID WC  . methylPREDNISolone (SOLU-MEDROL) injection  125 mg Intravenous Q6H  . nicotine  14 mg Transdermal Daily  . pantoprazole  40 mg Oral BID  . sodium chloride  3 mL Intravenous Q12H   Continuous: . sodium chloride 75 mL/hr at 03/28/14 0701   TZG:[id number], lipase/protease/amylase, LORazepam, ondansetron (ZOFRAN) IV, ondansetron, oxyCODONE, polyethylene glycol  Assesment: She was admitted with COPD exacerbation. She says she stopped smoking about 2 weeks ago. She had respiratory failure requiring BiPAP but that seems  to have improved. She has chronic pancreatitis and what appears to be chronic pancreatic insufficiency and she says she frequently has trouble with taking steroids. Active Problems:   HYPERTENSION   Tobacco abuse   COPD exacerbation    Plan: Continue current treatments. I think we can reduce IV steroid dose and I've gone ahead and  done that. She should continue antibiotics. She should continue tobacco free.  Thanks for allow me to see her with you    LOS: 1 day   Lashica Hannay L 03/28/2014, 8:43 AM

## 2014-03-28 NOTE — Progress Notes (Signed)
UR chart review completed.  

## 2014-03-28 NOTE — Progress Notes (Signed)
RN took pt off bipap- pt requested breathing treatment-after the breathing treatment patient said she was still struggling to catch her breath so placed her back on the bipap. Resting comfortably now. RT will continue to monitor.

## 2014-03-28 NOTE — Care Management Note (Addendum)
    Page 1 of 1   03/30/2014     3:34:01 PM CARE MANAGEMENT NOTE 03/30/2014  Patient:  [name], [name]   Account Number:  [id number]  Date Initiated:  03/28/2014  Documentation initiated by:  Theophilus Kinds  Subjective/Objective Assessment:   Pt admitted from home with COPD. Pt lives with her husband and will return home at discharge. Pt is independent but is limited by her respiratory status. Pt has a neb machine for home use.     Action/Plan:   Pt may need home O2 at discharge. Will assess prior to discharge and arrange if pt qualifies.   Anticipated DC Date:  03/31/2014   Anticipated DC Plan:  [place]  CM consult      Choice offered to / List presented to:             Status of service:  Completed, signed off Medicare Important Message given?  YES (If response is "NO", the following Medicare IM given date fields will be blank) Date Medicare IM given:  03/30/2014 Medicare IM given by:   Date Additional Medicare IM given:   Additional Medicare IM given by:    Discharge Disposition:  HOME/SELF CARE  Per UR Regulation:    If discussed at Long Length of Stay Meetings, dates discussed:    Comments:  03/30/14 1100 Vladimir Creeks RN/CM  Pt did not need O2- sats in the 90s- no D/C needs identified 03/28/14 [place], RN BSN CM

## 2014-03-28 NOTE — Progress Notes (Signed)
PROGRESS NOTE  [name] [id number] DOB: 1970-09-26 DOA: 03/27/2014 PCP: Glo Herring., MD  Summary: 43 year old woman presented with increasing shortness of breath, low-grade fever, productive cough, wheezing. She was admitted for COPD exacerbation requiring BiPAP in the emergency department.  Assessment/Plan: 1. Acute hypoxic respiratory failure secondary to asthma/COPD exacerbation. Improving. 2. Asthma/COPD exacerbation. Improving. Hospitalized 01/2014 for acute hypoxic respiratory failure, asthma exacerbation 3. DM. Stable, continue SSI. Resume metformin on discharge. 4. Chronic pancreatitis, chronic abdominal pain apparently stable. 5. Anxiety, depression, bipolar 1 disorder  6. Tobacco dependence in early remission (quit 2 weeks prior to admission)   Overall improved, off BiPAP now. Continue supplemental oxygen, wean as tolerated. Agree with weaning steroids. Continue Levaquin.  Possible discharge next 24-48 hours.  Bowel regimen  Code Status: full code DVT prophylaxis: heparin Family Communication: none present Disposition Plan: home when improved  Murray Hodgkins, MD  [facility]  Pager [phone] If 7PM-7AM, please contact night-coverage at [email or web address], password [facility] 03/28/2014, 8:55 AM  LOS: 1 day   Consultants:  Pulmonology  Procedures:  BiPAP as needed  Antibiotics:  Levaquin 9/1 >>   HPI/Subjective: Breathing much better today. Chronic back pain related to asthma. Mild abdominal pain but ate breakfast this morning.  Objective: Filed Vitals:   03/28/14 0600 03/28/14 0700 03/28/14 0740 03/28/14 0800  BP: 105/60 90/47  106/72  Pulse: 103 102  103  Temp:   98.2 F (36.8 C)   TempSrc:   Oral   Resp: 15 23  13   Height:      Weight:      SpO2: 97% 94%  95%    Intake/Output Summary (Last 24 hours) at 03/28/14 0855 Last data filed at 03/28/14 4403  Gross per 24 hour  Intake  847.5 ml  Output   1500 ml  Net -652.5 ml      Filed Weights   03/27/14 1946 03/28/14 0500  Weight: 70.6 kg (155 lb 10.3 oz) 70.6 kg (155 lb 10.3 oz)    Exam:     Afebrile, vital signs are stable. Stable hypoxia 3 L. Gen. Appears calm and comfortable.   Psych. Alert. Speech fluent and clear.  Eyes. Wears glasses.  Cardiovascular. Tachycardic, regular rhythm. No murmur, rub or gallop. No lower extremity edema.  Respiratory. Clear to patient bilaterally. No wheezes, rales or rhonchi. Good air movement. Normal respiratory effort.  Abdomen. Soft. Nontender.  Skin. Appears grossly unremarkable.  Data Reviewed: I/O: Excellent urine output.  Chemistry: Complete metabolic panel unremarkable. Troponins negative.  Heme: WBC has improved, 13.3 >> 11.6  Imaging: Admission chest x-ray with right upper lobe opacity thought to be artifact. Followup as an outpatient.  Scheduled Meds: . amitriptyline  75 mg Oral QHS  . amLODipine  10 mg Oral QHS  . antiseptic oral rinse  7 mL Mouth Rinse q12n4p  . chlorhexidine  15 mL Mouth Rinse BID  . docusate sodium  100 mg Oral QHS  . ferrous sulfate  325 mg Oral Daily  . heparin  5,000 Units Subcutaneous 3 times per day  . hydrOXYzine  25 mg Oral QID  . insulin aspart  0-20 Units Subcutaneous TID WC  . insulin aspart  0-5 Units Subcutaneous QHS  . levofloxacin (LEVAQUIN) IV  500 mg Intravenous Q24H  . lipase/protease/amylase  36,000 Units Oral TID AC  . metFORMIN  500 mg Oral BID WC  . methylPREDNISolone (SOLU-MEDROL) injection  40 mg Intravenous Q6H  . nicotine  14 mg Transdermal Daily  . pantoprazole  40 mg Oral BID  . sodium chloride  3 mL Intravenous Q12H   Continuous Infusions: . sodium chloride 75 mL/hr at 03/28/14 0701    Principal Problem:   Acute respiratory failure with hypoxia Active Problems:   HYPERTENSION   Tobacco abuse   Asthma exacerbation   Pancreatitis   COPD exacerbation   DM type 2 (diabetes mellitus, type 2)   Time spent 20  minutes

## 2014-03-29 LAB — GLUCOSE, CAPILLARY
Glucose-Capillary: 211 mg/dL — ABNORMAL HIGH (ref 70–99)
Glucose-Capillary: 276 mg/dL — ABNORMAL HIGH (ref 70–99)
Glucose-Capillary: 243 mg/dL — ABNORMAL HIGH (ref 70–99)
Glucose-Capillary: 168 mg/dL — ABNORMAL HIGH (ref 70–99)

## 2014-03-29 MED ORDER — METHYLPREDNISOLONE SODIUM SUCC 40 MG IJ SOLR
40.0000 mg | Freq: Two times a day (BID) | INTRAMUSCULAR | Status: DC
  Administered 2014-03-29 – 2014-03-30 (×2): 40 mg via INTRAVENOUS
  Filled 2014-03-29 (×2): qty 1

## 2014-03-29 MED ORDER — IPRATROPIUM-ALBUTEROL 0.5-2.5 (3) MG/3ML IN SOLN
3.0000 mL | Freq: Four times a day (QID) | RESPIRATORY_TRACT | Status: DC
  Administered 2014-03-30 (×2): 3 mL via RESPIRATORY_TRACT
  Filled 2014-03-29 (×2): qty 3

## 2014-03-29 NOTE — Progress Notes (Signed)
In and out cath for acute urinary retention.  400 cc return, clear yellow urine, patient feeling pain and pressure prior.  Pain right and left anterior upper abdomen.  Pain relieved after I&O. Vitals stable for this patient

## 2014-03-29 NOTE — Progress Notes (Signed)
PROGRESS NOTE  ENISA RUNYAN [id number]:[id number] DOB: 18-Feb-1971 DOA: 03/27/2014 PCP: Glo Herring., MD  Summary: 43 year old woman presented with increasing shortness of breath, low-grade fever, productive cough, wheezing. She was admitted for COPD exacerbation requiring BiPAP in the emergency department.  Assessment/Plan: 1. Acute hypoxic respiratory failure secondary to asthma/COPD exacerbation. Continues to improve. 2. Asthma/COPD exacerbation. Continues to improve. Hospitalized 01/2014 for acute hypoxic respiratory failure, asthma exacerbation 3. DM. Remains stable, continue SSI. Resume metformin on discharge. 4. Chronic pancreatitis, chronic abdominal pain, stable. 5. Anxiety, depression, bipolar 1 disorder  6. Tobacco dependence in early remission (quit 2 weeks prior to admission)   Overall improving. Plan transferred to medical floor today, decrease steroids, wean oxygen as tolerated. Continue bronchodilators.  Anticipate discharge next one to 2 days.  Code Status: full code DVT prophylaxis: heparin Family Communication: none present Disposition Plan: home when improved  Murray Hodgkins, MD  [facility]  Pager [phone] If 7PM-7AM, please contact night-coverage at [email or web address], password [facility] 03/29/2014, 7:54 AM  LOS: 2 days   Consultants:  Pulmonology  Procedures:  BiPAP as needed  Antibiotics:  Levaquin 9/1 >>   HPI/Subjective: Urinary retention overnight, status post in/out catheter  She reports intermittent urinary retention at home. She has never seen a urologist. Overall she is feeling better today, breathing better today. Eating fine.  Objective: Filed Vitals:   03/29/14 0400 03/29/14 0440 03/29/14 0500 03/29/14 0750  BP: 112/65     Pulse: 94     Temp: 98.5 F (36.9 C)   97.8 F (36.6 C)  TempSrc: Oral   Oral  Resp: 11     Height:      Weight:   70 kg (154 lb 5.2 oz)   SpO2: 97% 93%      Intake/Output Summary (Last 24 hours) at  03/29/14 0754 Last data filed at 03/29/14 0300  Gross per 24 hour  Intake    865 ml  Output   1825 ml  Net   -960 ml     Filed Weights   03/27/14 1946 03/28/14 0500 03/29/14 0500  Weight: 70.6 kg (155 lb 10.3 oz) 70.6 kg (155 lb 10.3 oz) 70 kg (154 lb 5.2 oz)    Exam:     Afebrile, vital signs are stable. Stable hypoxia 93% on 2 L.  Gen. Appears calm, comfortable.  Respiratory clear to auscultation bilaterally, fair air movement. No wheezes, rales or rhonchi. Normal respiratory effort.  Cardiovascular regular rate and rhythm. No murmur, rub or gallop. No lower extremity edema.  Abdomen soft  Psychiatric grossly normal mood and affect. Speech fluent and appropriate.  Data Reviewed:  Excellent urine output.  Blood sugars stable.  Scheduled Meds: . amitriptyline  75 mg Oral QHS  . amLODipine  10 mg Oral QHS  . antiseptic oral rinse  7 mL Mouth Rinse q12n4p  . chlorhexidine  15 mL Mouth Rinse BID  . docusate sodium  100 mg Oral QHS  . ferrous sulfate  325 mg Oral Daily  . heparin  5,000 Units Subcutaneous 3 times per day  . hydrOXYzine  25 mg Oral QID  . insulin aspart  0-20 Units Subcutaneous TID WC  . insulin aspart  0-5 Units Subcutaneous QHS  . ipratropium-albuterol  3 mL Inhalation Q6H  . levofloxacin (LEVAQUIN) IV  500 mg Intravenous Q24H  . lipase/protease/amylase  36,000 Units Oral TID AC  . methylPREDNISolone (SOLU-MEDROL) injection  40 mg Intravenous Q6H  . nicotine  14 mg Transdermal Daily  . pantoprazole  40 mg Oral BID  . polyethylene glycol  17 g Oral BID  . sodium chloride  3 mL Intravenous Q12H   Continuous Infusions:    Principal Problem:   Acute respiratory failure with hypoxia Active Problems:   HYPERTENSION   Tobacco abuse   Asthma exacerbation   Pancreatitis   COPD exacerbation   DM type 2 (diabetes mellitus, type 2)   Time spent 20 minutes

## 2014-03-29 NOTE — Progress Notes (Signed)
Gave report to [facility] from [facility] who will be getting patient. Patient was able to be transferred in wheelchair to 309 with oxygen at 3lpm and left in care of [place] on floor.

## 2014-03-29 NOTE — Progress Notes (Signed)
Subjective: She feels better. She had some abdominal discomfort in her lower abdomen last night which appeared to be acute urinary retention. She had a Foley catheter placed and had about 400 cc of urine out.  Objective: Vital signs in last 24 hours: Temp:  [97.8 F (36.6 C)-98.6 F (37 C)] 97.8 F (36.6 C) (09/03 0750) Pulse Rate:  [94-123] 94 (09/03 0400) Resp:  [11-28] 11 (09/03 0400) BP: (103-131)/(53-91) 112/65 mmHg (09/03 0400) SpO2:  [92 %-99 %] 93 % (09/03 0440) FiO2 (%):  [35 %] 35 % (09/02 1229) Weight:  [70 kg (154 lb 5.2 oz)] 70 kg (154 lb 5.2 oz) (09/03 0500) Weight change: -0.6 kg (-1 lb 5.2 oz) Last BM Date: 03/25/14  Intake/Output from previous day: 09/02 0701 - 09/03 0700 In: 865 [P.O.:360; I.V.:405; IV Piggyback:100] Out: 1825 [Urine:1825]  PHYSICAL EXAM General appearance: alert, cooperative and mild distress Resp: rhonchi bilaterally Cardio: regular rate and rhythm, S1, S2 normal, no murmur, click, rub or gallop GI: soft, non-tender; bowel sounds normal; no masses,  no organomegaly Extremities: extremities normal, atraumatic, no cyanosis or edema  Lab Results:  Results for orders placed during the hospital encounter of 03/27/14 (from the past 48 hour(s))  BLOOD GAS, VENOUS     Status: Abnormal   Collection Time    03/27/14  5:04 PM      Result Value Ref Range   pH, Ven 7.388 (*) 7.250 - 7.300   pCO2, Ven 39.9 (*) 45.0 - 50.0 mmHg   pO2, Ven 61.2 (*) 30.0 - 45.0 mmHg   Bicarbonate 23.5  20.0 - 24.0 mEq/L   TCO2 20.7  0 - 100 mmol/L   Acid-base deficit 0.8  0.0 - 2.0 mmol/L   O2 Saturation 91.3     Drawn by [place]     Sample type VENOUS    CBC WITH DIFFERENTIAL     Status: Abnormal   Collection Time    03/27/14  5:19 PM      Result Value Ref Range   WBC 13.3 (*) 4.0 - 10.5 K/uL   RBC 4.81  3.87 - 5.11 MIL/uL   Hemoglobin 14.7  12.0 - 15.0 g/dL   HCT 42.2  36.0 - 46.0 %   MCV 87.7  78.0 - 100.0 fL   MCH 30.6  26.0 - 34.0 pg   MCHC 34.8  30.0 - 36.0 g/dL   RDW 12.5  11.5 - 15.5 %   Platelets 308  150 - 400 K/uL   Neutrophils Relative % 59  43 - 77 %   Neutro Abs 7.9 (*) 1.7 - 7.7 K/uL   Lymphocytes Relative 24  12 - 46 %   Lymphs Abs 3.1  0.7 - 4.0 K/uL   Monocytes Relative 6  3 - 12 %   Monocytes Absolute 0.7  0.1 - 1.0 K/uL   Eosinophils Relative 10 (*) 0 - 5 %   Eosinophils Absolute 1.4 (*) 0.0 - 0.7 K/uL   Basophils Relative 1  0 - 1 %   Basophils Absolute 0.1  0.0 - 0.1 K/uL  COMPREHENSIVE METABOLIC PANEL     Status: Abnormal   Collection Time    03/27/14  5:19 PM      Result Value Ref Range   Sodium 139  137 - 147 mEq/L   Potassium 3.8  3.7 - 5.3 mEq/L   Chloride 101  96 - 112 mEq/L   CO2 25  19 - 32 mEq/L   Glucose, Bld  162 (*) 70 - 99 mg/dL   BUN 12  6 - 23 mg/dL   Creatinine, Ser 0.77  0.50 - 1.10 mg/dL   Calcium 9.3  8.4 - 10.5 mg/dL   Total Protein 7.5  6.0 - 8.3 g/dL   Albumin 4.2  3.5 - 5.2 g/dL   AST 20  0 - 37 U/L   ALT 23  0 - 35 U/L   Alkaline Phosphatase 92  39 - 117 U/L   Total Bilirubin 0.3  0.3 - 1.2 mg/dL   GFR calc non Af Amer >90  >90 mL/min   GFR calc Af Amer >90  >90 mL/min   Comment: (NOTE)     The eGFR has been calculated using the CKD EPI equation.     This calculation has not been validated in all clinical situations.     eGFR's persistently <90 mL/min signify possible Chronic Kidney     Disease.   Anion gap 13  5 - 15  TROPONIN I     Status: None   Collection Time    03/27/14  5:19 PM      Result Value Ref Range   Troponin I <0.30  <0.30 ng/mL   Comment:            Due to the release kinetics of cTnI,     a negative result within the first hours     of the onset of symptoms does not rule out     myocardial infarction with certainty.     If myocardial infarction is still suspected,     repeat the test at appropriate intervals.  PRO B NATRIURETIC PEPTIDE     Status: None   Collection Time    03/27/14  5:19 PM      Result Value Ref Range   Pro B Natriuretic  peptide (BNP) 15.0  0 - 125 pg/mL  TSH     Status: None   Collection Time    03/27/14  5:19 PM      Result Value Ref Range   TSH 3.290  0.350 - 4.500 uIU/mL   Comment: Performed at [facility]  MRSA PCR SCREENING     Status: None   Collection Time    03/27/14  9:13 PM      Result Value Ref Range   MRSA by PCR NEGATIVE  NEGATIVE   Comment:            The GeneXpert MRSA Assay (FDA     approved for NASAL specimens     only), is one component of a     comprehensive MRSA colonization     surveillance program. It is not     intended to diagnose MRSA     infection nor to guide or     monitor treatment for     MRSA infections.  GLUCOSE, CAPILLARY     Status: Abnormal   Collection Time    03/27/14  9:13 PM      Result Value Ref Range   Glucose-Capillary 274 (*) 70 - 99 mg/dL   Comment 1 Notify RN    TROPONIN I     Status: None   Collection Time    03/27/14 10:45 PM      Result Value Ref Range   Troponin I <0.30  <0.30 ng/mL   Comment:            Due to the release kinetics of cTnI,     a negative  result within the first hours     of the onset of symptoms does not rule out     myocardial infarction with certainty.     If myocardial infarction is still suspected,     repeat the test at appropriate intervals.  TROPONIN I     Status: None   Collection Time    03/28/14  4:53 AM      Result Value Ref Range   Troponin I <0.30  <0.30 ng/mL   Comment:            Due to the release kinetics of cTnI,     a negative result within the first hours     of the onset of symptoms does not rule out     myocardial infarction with certainty.     If myocardial infarction is still suspected,     repeat the test at appropriate intervals.  COMPREHENSIVE METABOLIC PANEL     Status: Abnormal   Collection Time    03/28/14  4:53 AM      Result Value Ref Range   Sodium 138  137 - 147 mEq/L   Potassium 4.4  3.7 - 5.3 mEq/L   Chloride 103  96 - 112 mEq/L   CO2 22  19 - 32 mEq/L   Glucose,  Bld 210 (*) 70 - 99 mg/dL   BUN 12  6 - 23 mg/dL   Creatinine, Ser 0.61  0.50 - 1.10 mg/dL   Calcium 8.6  8.4 - 10.5 mg/dL   Total Protein 6.6  6.0 - 8.3 g/dL   Albumin 3.6  3.5 - 5.2 g/dL   AST 12  0 - 37 U/L   ALT 18  0 - 35 U/L   Alkaline Phosphatase 84  39 - 117 U/L   Total Bilirubin 0.3  0.3 - 1.2 mg/dL   GFR calc non Af Amer >90  >90 mL/min   GFR calc Af Amer >90  >90 mL/min   Comment: (NOTE)     The eGFR has been calculated using the CKD EPI equation.     This calculation has not been validated in all clinical situations.     eGFR's persistently <90 mL/min signify possible Chronic Kidney     Disease.   Anion gap 13  5 - 15  CBC     Status: Abnormal   Collection Time    03/28/14  4:53 AM      Result Value Ref Range   WBC 11.6 (*) 4.0 - 10.5 K/uL   RBC 4.27  3.87 - 5.11 MIL/uL   Hemoglobin 13.2  12.0 - 15.0 g/dL   HCT 37.7  36.0 - 46.0 %   MCV 88.3  78.0 - 100.0 fL   MCH 30.9  26.0 - 34.0 pg   MCHC 35.0  30.0 - 36.0 g/dL   RDW 12.5  11.5 - 15.5 %   Platelets 263  150 - 400 K/uL  GLUCOSE, CAPILLARY     Status: Abnormal   Collection Time    03/28/14  7:22 AM      Result Value Ref Range   Glucose-Capillary 188 (*) 70 - 99 mg/dL   Comment 1 Documented in Chart     Comment 2 Notify RN    TROPONIN I     Status: None   Collection Time    03/28/14  9:52 AM      Result Value Ref Range   Troponin I <0.30  <0.30 ng/mL  Comment:            Due to the release kinetics of cTnI,     a negative result within the first hours     of the onset of symptoms does not rule out     myocardial infarction with certainty.     If myocardial infarction is still suspected,     repeat the test at appropriate intervals.  GLUCOSE, CAPILLARY     Status: Abnormal   Collection Time    03/28/14 11:34 AM      Result Value Ref Range   Glucose-Capillary 208 (*) 70 - 99 mg/dL  GLUCOSE, CAPILLARY     Status: Abnormal   Collection Time    03/28/14  4:37 PM      Result Value Ref Range    Glucose-Capillary 173 (*) 70 - 99 mg/dL   Comment 1 Documented in Chart     Comment 2 Notify RN    GLUCOSE, CAPILLARY     Status: Abnormal   Collection Time    03/28/14  9:43 PM      Result Value Ref Range   Glucose-Capillary 199 (*) 70 - 99 mg/dL  GLUCOSE, CAPILLARY     Status: Abnormal   Collection Time    03/29/14  7:37 AM      Result Value Ref Range   Glucose-Capillary 211 (*) 70 - 99 mg/dL   Comment 1 Documented in Chart     Comment 2 Notify RN      ABGS  Recent Labs  03/27/14 1704  TCO2 20.7  HCO3 23.5   CULTURES Recent Results (from the past 240 hour(s))  MRSA PCR SCREENING     Status: None   Collection Time    03/27/14  9:13 PM      Result Value Ref Range Status   MRSA by PCR NEGATIVE  NEGATIVE Final   Comment:            The GeneXpert MRSA Assay (FDA     approved for NASAL specimens     only), is one component of a     comprehensive MRSA colonization     surveillance program. It is not     intended to diagnose MRSA     infection nor to guide or     monitor treatment for     MRSA infections.   Studies/Results: Dg Chest Portable 1 View  03/27/2014   CLINICAL DATA:  Shortness of breath  EXAM: PORTABLE CHEST - 1 VIEW  COMPARISON:  Chest radiograph 02/19/2014 and multiple additional priors.  FINDINGS: Stable cardiac and mediastinal contours. Suggestion of focal consolidative opacity within the right upper lung. No pleural effusion or pneumothorax. Regional skeleton is unremarkable.  IMPRESSION: Suggestion of possible consolidative opacity within the right upper lung versus artifact from overlying structures. Recommend short term radiographic followup to evaluate for interval change/ resolution to exclude the possibility of underlying mass.   Electronically Signed   By: Lovey Newcomer M.D.   On: 03/27/2014 17:28    Medications:  Prior to Admission:  Prescriptions prior to admission  Medication Sig Dispense Refill  . albuterol (PROVENTIL HFA;VENTOLIN HFA) 108 (90  BASE) MCG/ACT inhaler Inhale 2 puffs into the lungs daily as needed. For shortness of breath      . amitriptyline (ELAVIL) 75 MG tablet Take 75 mg by mouth at bedtime.      Marland Kitchen amLODipine (NORVASC) 10 MG tablet Take 10 mg by mouth at bedtime.       Marland Kitchen  docusate sodium (COLACE) 100 MG capsule Take 100 mg by mouth at bedtime.       . ferrous sulfate 325 (65 FE) MG tablet Take 325 mg by mouth daily.      . hydrOXYzine (VISTARIL) 25 MG capsule Take 25 mg by mouth 4 (four) times daily.      Marland Kitchen ipratropium-albuterol (DUONEB) 0.5-2.5 (3) MG/3ML SOLN Inhale 3 mLs into the lungs every 6 (six) hours as needed. Take 3 mLs by nebulization every 6 (six) hours as needed for Wheezing.  360 mL  0  . LORazepam (ATIVAN) 0.5 MG tablet Take 0.5 mg by mouth every 8 (eight) hours as needed for anxiety.       . metFORMIN (GLUCOPHAGE) 500 MG tablet Take 500 mg by mouth 2 (two) times daily with a meal.       . nicotine (NICODERM CQ - DOSED IN MG/24 HOURS) 14 mg/24hr patch Place 1 patch (14 mg total) onto the skin daily.  28 patch  0  . oxyCODONE (ROXICODONE) 15 MG immediate release tablet Take 15 mg by mouth every 4 (four) hours as needed for pain.      . Pancrelipase, Lip-Prot-Amyl, (ZENPEP) 20000 UNITS CPEP Take 2-3 capsules by mouth 5 (five) times daily. 3 capsules three times daily with each meal and 2 capsules with each snack.      . pantoprazole (PROTONIX) 40 MG tablet Take 40 mg by mouth 2 (two) times daily.      . polyethylene glycol powder (GLYCOLAX/MIRALAX) powder Take 17 g by mouth daily as needed. for Constipation.      . promethazine (PHENERGAN) 25 MG tablet TAKE (1) TABLET BY MOUTH EVERY SIX HOURS AS NEEDED FOR NAUSEA.  30 tablet  0   Scheduled: . amitriptyline  75 mg Oral QHS  . amLODipine  10 mg Oral QHS  . antiseptic oral rinse  7 mL Mouth Rinse q12n4p  . chlorhexidine  15 mL Mouth Rinse BID  . docusate sodium  100 mg Oral QHS  . ferrous sulfate  325 mg Oral Daily  . heparin  5,000 Units Subcutaneous 3  times per day  . hydrOXYzine  25 mg Oral QID  . insulin aspart  0-20 Units Subcutaneous TID WC  . insulin aspart  0-5 Units Subcutaneous QHS  . ipratropium-albuterol  3 mL Inhalation Q6H  . levofloxacin (LEVAQUIN) IV  500 mg Intravenous Q24H  . lipase/protease/amylase  36,000 Units Oral TID AC  . methylPREDNISolone (SOLU-MEDROL) injection  40 mg Intravenous Q12H  . nicotine  14 mg Transdermal Daily  . pantoprazole  40 mg Oral BID  . polyethylene glycol  17 g Oral BID  . sodium chloride  3 mL Intravenous Q12H   Continuous:  IPJ:[id number], lipase/protease/amylase, LORazepam, ondansetron (ZOFRAN) IV, ondansetron, oxyCODONE, polyethylene glycol  Assesment: She has acute hypoxic respiratory failure. She is improving. She has COPD at baseline. She did not require BiPAP last night but did require a prn nebulizer treatment this morning. Principal Problem:   Acute respiratory failure with hypoxia Active Problems:   HYPERTENSION   Tobacco abuse   Asthma exacerbation   Pancreatitis   COPD exacerbation   DM type 2 (diabetes mellitus, type 2)    Plan: I agree with transfer to floor reducing steroids etc.    LOS: 2 days   Adamaris King L 03/29/2014, 8:42 AM

## 2014-03-30 LAB — GLUCOSE, CAPILLARY
GLUCOSE-CAPILLARY: 194 mg/dL — AB (ref 70–99)
Glucose-Capillary: 212 mg/dL — ABNORMAL HIGH (ref 70–99)

## 2014-03-30 MED ORDER — LEVOFLOXACIN 500 MG PO TABS: 500.0000 mg | ORAL_TABLET | Freq: Every day | ORAL | Status: DC

## 2014-03-30 MED ORDER — PREDNISONE 10 MG PO TABS: ORAL_TABLET | ORAL | Status: DC

## 2014-03-30 MED ORDER — BUDESONIDE-FORMOTEROL FUMARATE 80-4.5 MCG/ACT IN AERO: 2.0000 | INHALATION_SPRAY | Freq: Two times a day (BID) | RESPIRATORY_TRACT | Status: DC

## 2014-03-30 MED ORDER — PREDNISONE 20 MG PO TABS: 40.0000 mg | ORAL_TABLET | Freq: Every day | ORAL | Status: DC

## 2014-03-30 NOTE — Progress Notes (Signed)
Pt O2 saturation 97% on RA while ambulating and while at rest. Will continue to monitor.

## 2014-03-30 NOTE — Discharge Summary (Signed)
Physician Discharge Summary  [name]:[id number] DOB: 07-14-1971 DOA: 03/27/2014  PCP: Glo Herring., MD  Admit date: 03/27/2014 Discharge date: 03/30/2014  Recommendations for Outpatient Follow-up:  1. Asthma exacerbation. Symbicort added. Consider changing to LABA alone as improves. 2. Suspected artifact right upper lobe on chest x-ray. Consider repeat chest x-ray in the next several weeks to document resolution. See chest x-ray report below. 3. Continue to encourage efforts to remain abstinent from smoking  4. Reported intermittent urinary retention as an outpatient. Consider urology referral if recurs.   Follow-up Information   Follow up with Glo Herring., MD. Schedule an appointment as soon as possible for a visit in 1 week.   Specialty:  Internal Medicine   Contact information:   [address] [place] [address] [phone]      Discharge Diagnoses:  1. Acute hypoxic respiratory failure secondary to COPD/asthma exacerbation 2. Asthma/COPD exacerbation 3. Diabetes mellitus type 2 4. Tobacco dependence intervention  Discharge Condition: Improved Disposition: Home  Diet recommendation: Diabetic diet  Filed Weights   03/27/14 1946 03/28/14 0500 03/29/14 0500  Weight: 70.6 kg (155 lb 10.3 oz) 70.6 kg (155 lb 10.3 oz) 70 kg (154 lb 5.2 oz)    History of present illness:  43 year old woman presented with increasing shortness of breath, low-grade fever, productive cough, wheezing. She was admitted for COPD exacerbation requiring BiPAP in the emergency department.  Hospital Course:  [name] rapidly improved without further need for BiPAP. She was treated with steroids, antibiotics, nebulizers and oxygen. Hospitalization was uncomplicated, acute issues have stabilized in she is now stable for discharge. Individual issues as below.  1. Acute hypoxic respiratory failure secondary to asthma/COPD exacerbation. Suspect resolved at this point. Test  oxygenation on room air and with ambulation. 2. Asthma/COPD exacerbation. Exacerbation appears resolved. 3. DM type 2. Well controlled. 4. Chronic pancreatitis, chronic abdominal pain, remained stable. 5. Anxiety, depression, bipolar 1 disorder. Stable.  6. Tobacco dependence in remission (quit 2 weeks prior to admission) 7. Urinary retention. Resolved. Consider outpatient followup as clinically indicated. Much improved. Plan discharge home today on steroid taper, complete antibiotics, had Symbicort as recommended by pulmonology.  Discharge Instructions  Discharge Instructions   Activity as tolerated - No restrictions    Complete by:  As directed      Diet - low sodium heart healthy    Complete by:  As directed      Diet Carb Modified    Complete by:  As directed      Discharge instructions    Complete by:  As directed   Call your physician or seek immediate medical attention for shortness of breath, wheezing or worsening of your condition.          Current Discharge Medication List    START taking these medications   Details  budesonide-formoterol (SYMBICORT) 80-4.5 MCG/ACT inhaler Inhale 2 puffs into the lungs 2 (two) times daily. Qty: 1 Inhaler, Refills: 0    levofloxacin (LEVAQUIN) 500 MG tablet Take 1 tablet (500 mg total) by mouth daily. Take in the evening, start 9/4. Qty: 2 tablet, Refills: 0    predniSONE (DELTASONE) 10 MG tablet Start 9/5 . Take 40 mg by mouth daily for 3 days, then take 20 mg by mouth daily for 3 days, then take 10 mg by mouth daily for 3 days, then stop. Qty: 21 tablet, Refills: 0      CONTINUE these medications which have NOT CHANGED   Details  albuterol (PROVENTIL HFA;VENTOLIN  HFA) 108 (90 BASE) MCG/ACT inhaler Inhale 2 puffs into the lungs daily as needed. For shortness of breath    amitriptyline (ELAVIL) 75 MG tablet Take 75 mg by mouth at bedtime.    amLODipine (NORVASC) 10 MG tablet Take 10 mg by mouth at bedtime.     docusate sodium  (COLACE) 100 MG capsule Take 100 mg by mouth at bedtime.     ferrous sulfate 325 (65 FE) MG tablet Take 325 mg by mouth daily.    hydrOXYzine (VISTARIL) 25 MG capsule Take 25 mg by mouth 4 (four) times daily.    ipratropium-albuterol (DUONEB) 0.5-2.5 (3) MG/3ML SOLN Inhale 3 mLs into the lungs every 6 (six) hours as needed. Take 3 mLs by nebulization every 6 (six) hours as needed for Wheezing. Qty: 360 mL, Refills: 0    LORazepam (ATIVAN) 0.5 MG tablet Take 0.5 mg by mouth every 8 (eight) hours as needed for anxiety.     metFORMIN (GLUCOPHAGE) 500 MG tablet Take 500 mg by mouth 2 (two) times daily with a meal.     nicotine (NICODERM CQ - DOSED IN MG/24 HOURS) 14 mg/24hr patch Place 1 patch (14 mg total) onto the skin daily. Qty: 28 patch, Refills: 0    oxyCODONE (ROXICODONE) 15 MG immediate release tablet Take 15 mg by mouth every 4 (four) hours as needed for pain.    Pancrelipase, Lip-Prot-Amyl, (ZENPEP) 20000 UNITS CPEP Take 2-3 capsules by mouth 5 (five) times daily. 3 capsules three times daily with each meal and 2 capsules with each snack.    pantoprazole (PROTONIX) 40 MG tablet Take 40 mg by mouth 2 (two) times daily.    polyethylene glycol powder (GLYCOLAX/MIRALAX) powder Take 17 g by mouth daily as needed. for Constipation.    promethazine (PHENERGAN) 25 MG tablet TAKE (1) TABLET BY MOUTH EVERY SIX HOURS AS NEEDED FOR NAUSEA. Qty: 30 tablet, Refills: 0       Allergies  Allergen Reactions  . Penicillins Shortness Of Breath       . Sulfa Antibiotics Shortness Of Breath  . Prednisone Other (See Comments)    Pancreatitis, but has to take for asthma sometimes    The results of significant diagnostics from this hospitalization (including imaging, microbiology, ancillary and laboratory) are listed below for reference.    Significant Diagnostic Studies: Dg Chest Portable 1 View  03/27/2014   CLINICAL DATA:  Shortness of breath  EXAM: PORTABLE CHEST - 1 VIEW  COMPARISON:   Chest radiograph 02/19/2014 and multiple additional priors.  FINDINGS: Stable cardiac and mediastinal contours. Suggestion of focal consolidative opacity within the right upper lung. No pleural effusion or pneumothorax. Regional skeleton is unremarkable.  IMPRESSION: Suggestion of possible consolidative opacity within the right upper lung versus artifact from overlying structures. Recommend short term radiographic followup to evaluate for interval change/ resolution to exclude the possibility of underlying mass.   Electronically Signed   By: Lovey Newcomer M.D.   On: 03/27/2014 17:28    Microbiology: Recent Results (from the past 240 hour(s))  MRSA PCR SCREENING     Status: None   Collection Time    03/27/14  9:13 PM      Result Value Ref Range Status   MRSA by PCR NEGATIVE  NEGATIVE Final   Comment:            The GeneXpert MRSA Assay (FDA     approved for NASAL specimens     only), is one component of a  comprehensive MRSA colonization     surveillance program. It is not     intended to diagnose MRSA     infection nor to guide or     monitor treatment for     MRSA infections.     Labs: Basic Metabolic Panel:  Recent Labs Lab 03/27/14 1719 03/28/14 0453  NA 139 138  K 3.8 4.4  CL 101 103  CO2 25 22  GLUCOSE 162* 210*  BUN 12 12  CREATININE 0.77 0.61  CALCIUM 9.3 8.6   Liver Function Tests:  Recent Labs Lab 03/27/14 1719 03/28/14 0453  AST 20 12  ALT 23 18  ALKPHOS 92 84  BILITOT 0.3 0.3  PROT 7.5 6.6  ALBUMIN 4.2 3.6   CBC:  Recent Labs Lab 03/27/14 1719 03/28/14 0453  WBC 13.3* 11.6*  NEUTROABS 7.9*  --   HGB 14.7 13.2  HCT 42.2 37.7  MCV 87.7 88.3  PLT 308 263   Cardiac Enzymes:  Recent Labs Lab 03/27/14 1719 03/27/14 2245 03/28/14 0453 03/28/14 0952  TROPONINI <0.30 <0.30 <0.30 <0.30     Recent Labs  06/29/13 1700 02/19/14 0830 03/27/14 1719  PROBNP 37.5 33.7 15.0   CBG:  Recent Labs Lab 03/29/14 1141 03/29/14 1800  03/29/14 2010 03/30/14 0728 03/30/14 1138  GLUCAP 276* 243* 168* 212* 194*    Principal Problem:   Acute respiratory failure with hypoxia Active Problems:   HYPERTENSION   Tobacco abuse   Asthma exacerbation   Pancreatitis   COPD exacerbation   DM type 2 (diabetes mellitus, type 2)   Time coordinating discharge: 20 minutes  Signed:  Murray Hodgkins, MD [facility] 03/30/2014, 12:35 PM

## 2014-03-30 NOTE — Progress Notes (Signed)
PROGRESS NOTE  [name]:[id number] DOB: March 03, 1971 DOA: 03/27/2014 PCP: Glo Herring., MD  Summary: 43 year old woman presented with increasing shortness of breath, low-grade fever, productive cough, wheezing. She was admitted for COPD exacerbation requiring BiPAP in the emergency department.  Assessment/Plan: 1. Acute hypoxic respiratory failure secondary to asthma/COPD exacerbation. Suspect resolved at this point. Test oxygenation on room air and with ambulation. 2. Asthma/COPD exacerbation. Exacerbation appears resolved. 3. DM type 2. Well controlled. 4. Chronic pancreatitis, chronic abdominal pain, remained stable. 5. Anxiety, depression, bipolar 1 disorder. Stable.  6. Tobacco dependence in remission (quit 2 weeks prior to admission) 7. Urinary retention. Resolved. Consider outpatient followup as clinically indicated.   Much improved. Plan discharge home today on steroid taper, complete antibiotics, had Symbicort as recommended by pulmonology.  Murray Hodgkins, MD  [facility]  Pager [phone] If 7PM-7AM, please contact night-coverage at [email or web address], password [facility] 03/30/2014, 11:51 AM  LOS: 3 days   Consultants:  Pulmonology  Procedures:  BiPAP as needed  Antibiotics:  Levaquin 9/1 >> 9/5  HPI/Subjective: Feeling much better. Breathing better. Ready to go home. Eating well.  Objective: Filed Vitals:   03/29/14 2302 03/29/14 2305 03/30/14 0626 03/30/14 0739  BP:   107/68   Pulse:   84   Temp:   97.4 F (36.3 C)   TempSrc:   Oral   Resp:   20   Height:      Weight:      SpO2: 97% 97% 98% 98%    Intake/Output Summary (Last 24 hours) at 03/30/14 1151 Last data filed at 03/29/14 1700  Gross per 24 hour  Intake    240 ml  Output      0 ml  Net    240 ml     Filed Weights   03/27/14 1946 03/28/14 0500 03/29/14 0500  Weight: 70.6 kg (155 lb 10.3 oz) 70.6 kg (155 lb 10.3 oz) 70 kg (154 lb 5.2 oz)    Exam:     Afebrile, vital  signs are stable.  Gen. Appears calm, comfortable.  Cardiovascular regular rate and rhythm. No murmur, rub or gallop.  Respiratory clear to auscultation bilaterally. No wheezes, rales or rhonchi. Normal respiratory effort.  Psychiatric. Grossly normal mood and affect. Speech fluent and appropriate.  Data Reviewed:  Capillary blood sugars stable.  Scheduled Meds: . amitriptyline  75 mg Oral QHS  . amLODipine  10 mg Oral QHS  . antiseptic oral rinse  7 mL Mouth Rinse q12n4p  . chlorhexidine  15 mL Mouth Rinse BID  . docusate sodium  100 mg Oral QHS  . ferrous sulfate  325 mg Oral Daily  . heparin  5,000 Units Subcutaneous 3 times per day  . hydrOXYzine  25 mg Oral QID  . insulin aspart  0-20 Units Subcutaneous TID WC  . insulin aspart  0-5 Units Subcutaneous QHS  . ipratropium-albuterol  3 mL Inhalation Q6H WA  . levofloxacin (LEVAQUIN) IV  500 mg Intravenous Q24H  . lipase/protease/amylase  36,000 Units Oral TID AC  . methylPREDNISolone (SOLU-MEDROL) injection  40 mg Intravenous Q12H  . nicotine  14 mg Transdermal Daily  . pantoprazole  40 mg Oral BID  . polyethylene glycol  17 g Oral BID  . sodium chloride  3 mL Intravenous Q12H   Continuous Infusions:    Principal Problem:   Acute respiratory failure with hypoxia Active Problems:   HYPERTENSION   Tobacco abuse   Asthma exacerbation   Pancreatitis   COPD exacerbation  DM type 2 (diabetes mellitus, type 2)

## 2014-03-30 NOTE — Progress Notes (Signed)
UR review complete.  

## 2014-03-30 NOTE — Progress Notes (Signed)
Discharge education completed by RN. Pt and pt's mother received a copy of discharge paperwork and confirm understanding of follow up appointments and discharge medications. Both deny any questions at this time. IV removed, site is within normal limits. Pt will discharge from the unit via wheelchair.

## 2014-03-30 NOTE — Progress Notes (Signed)
Subjective: She says she's doing better. She has no new complaints. Her breathing has improved  Objective: Vital signs in last 24 hours: Temp:  [97.4 F (36.3 C)-98.3 F (36.8 C)] 97.4 F (36.3 C) (09/04 0626) Pulse Rate:  [84-112] 84 (09/04 0626) Resp:  [15-20] 20 (09/04 0626) BP: (99-139)/(56-83) 107/68 mmHg (09/04 0626) SpO2:  [96 %-100 %] 98 % (09/04 0739) Weight change:  Last BM Date: 03/29/14  Intake/Output from previous day: 09/03 0701 - 09/04 0700 In: 240 [P.O.:240] Out: -   PHYSICAL EXAM General appearance: alert, cooperative and no distress Resp: clear to auscultation bilaterally Cardio: regular rate and rhythm, S1, S2 normal, no murmur, click, rub or gallop GI: soft, non-tender; bowel sounds normal; no masses,  no organomegaly Extremities: extremities normal, atraumatic, no cyanosis or edema  Lab Results:  Results for orders placed during the hospital encounter of 03/27/14 (from the past 48 hour(s))  TROPONIN I     Status: None   Collection Time    03/28/14  9:52 AM      Result Value Ref Range   Troponin I <0.30  <0.30 ng/mL   Comment:            Due to the release kinetics of cTnI,     a negative result within the first hours     of the onset of symptoms does not rule out     myocardial infarction with certainty.     If myocardial infarction is still suspected,     repeat the test at appropriate intervals.  GLUCOSE, CAPILLARY     Status: Abnormal   Collection Time    03/28/14 11:34 AM      Result Value Ref Range   Glucose-Capillary 208 (*) 70 - 99 mg/dL  GLUCOSE, CAPILLARY     Status: Abnormal   Collection Time    03/28/14  4:37 PM      Result Value Ref Range   Glucose-Capillary 173 (*) 70 - 99 mg/dL   Comment 1 Documented in Chart     Comment 2 Notify RN    GLUCOSE, CAPILLARY     Status: Abnormal   Collection Time    03/28/14  9:43 PM      Result Value Ref Range   Glucose-Capillary 199 (*) 70 - 99 mg/dL  GLUCOSE, CAPILLARY     Status: Abnormal    Collection Time    03/29/14  7:37 AM      Result Value Ref Range   Glucose-Capillary 211 (*) 70 - 99 mg/dL   Comment 1 Documented in Chart     Comment 2 Notify RN    GLUCOSE, CAPILLARY     Status: Abnormal   Collection Time    03/29/14 11:41 AM      Result Value Ref Range   Glucose-Capillary 276 (*) 70 - 99 mg/dL   Comment 1 Documented in Chart     Comment 2 Notify RN    GLUCOSE, CAPILLARY     Status: Abnormal   Collection Time    03/29/14  6:00 PM      Result Value Ref Range   Glucose-Capillary 243 (*) 70 - 99 mg/dL   Comment 1 Notify RN    GLUCOSE, CAPILLARY     Status: Abnormal   Collection Time    03/29/14  8:10 PM      Result Value Ref Range   Glucose-Capillary 168 (*) 70 - 99 mg/dL   Comment 1 Documented in Chart  Comment 2 Notify RN    GLUCOSE, CAPILLARY     Status: Abnormal   Collection Time    03/30/14  7:28 AM      Result Value Ref Range   Glucose-Capillary 212 (*) 70 - 99 mg/dL    ABGS  Recent Labs  03/27/14 1704  TCO2 20.7  HCO3 23.5   CULTURES Recent Results (from the past 240 hour(s))  MRSA PCR SCREENING     Status: None   Collection Time    03/27/14  9:13 PM      Result Value Ref Range Status   MRSA by PCR NEGATIVE  NEGATIVE Final   Comment:            The GeneXpert MRSA Assay (FDA     approved for NASAL specimens     only), is one component of a     comprehensive MRSA colonization     surveillance program. It is not     intended to diagnose MRSA     infection nor to guide or     monitor treatment for     MRSA infections.   Studies/Results: No results found.  Medications:  Prior to Admission:  Prescriptions prior to admission  Medication Sig Dispense Refill  . albuterol (PROVENTIL HFA;VENTOLIN HFA) 108 (90 BASE) MCG/ACT inhaler Inhale 2 puffs into the lungs daily as needed. For shortness of breath      . amitriptyline (ELAVIL) 75 MG tablet Take 75 mg by mouth at bedtime.      Marland Kitchen amLODipine (NORVASC) 10 MG tablet Take 10 mg by  mouth at bedtime.       . docusate sodium (COLACE) 100 MG capsule Take 100 mg by mouth at bedtime.       . ferrous sulfate 325 (65 FE) MG tablet Take 325 mg by mouth daily.      . hydrOXYzine (VISTARIL) 25 MG capsule Take 25 mg by mouth 4 (four) times daily.      Marland Kitchen ipratropium-albuterol (DUONEB) 0.5-2.5 (3) MG/3ML SOLN Inhale 3 mLs into the lungs every 6 (six) hours as needed. Take 3 mLs by nebulization every 6 (six) hours as needed for Wheezing.  360 mL  0  . LORazepam (ATIVAN) 0.5 MG tablet Take 0.5 mg by mouth every 8 (eight) hours as needed for anxiety.       . metFORMIN (GLUCOPHAGE) 500 MG tablet Take 500 mg by mouth 2 (two) times daily with a meal.       . nicotine (NICODERM CQ - DOSED IN MG/24 HOURS) 14 mg/24hr patch Place 1 patch (14 mg total) onto the skin daily.  28 patch  0  . oxyCODONE (ROXICODONE) 15 MG immediate release tablet Take 15 mg by mouth every 4 (four) hours as needed for pain.      . Pancrelipase, Lip-Prot-Amyl, (ZENPEP) 20000 UNITS CPEP Take 2-3 capsules by mouth 5 (five) times daily. 3 capsules three times daily with each meal and 2 capsules with each snack.      . pantoprazole (PROTONIX) 40 MG tablet Take 40 mg by mouth 2 (two) times daily.      . polyethylene glycol powder (GLYCOLAX/MIRALAX) powder Take 17 g by mouth daily as needed. for Constipation.      . promethazine (PHENERGAN) 25 MG tablet TAKE (1) TABLET BY MOUTH EVERY SIX HOURS AS NEEDED FOR NAUSEA.  30 tablet  0   Scheduled: . amitriptyline  75 mg Oral QHS  . amLODipine  10 mg Oral QHS  .  antiseptic oral rinse  7 mL Mouth Rinse q12n4p  . chlorhexidine  15 mL Mouth Rinse BID  . docusate sodium  100 mg Oral QHS  . ferrous sulfate  325 mg Oral Daily  . heparin  5,000 Units Subcutaneous 3 times per day  . hydrOXYzine  25 mg Oral QID  . insulin aspart  0-20 Units Subcutaneous TID WC  . insulin aspart  0-5 Units Subcutaneous QHS  . ipratropium-albuterol  3 mL Inhalation Q6H WA  . levofloxacin (LEVAQUIN) IV   500 mg Intravenous Q24H  . lipase/protease/amylase  36,000 Units Oral TID AC  . methylPREDNISolone (SOLU-MEDROL) injection  40 mg Intravenous Q12H  . nicotine  14 mg Transdermal Daily  . pantoprazole  40 mg Oral BID  . polyethylene glycol  17 g Oral BID  . sodium chloride  3 mL Intravenous Q12H   Continuous:  ZES:PQZRAQTMA, lipase/protease/amylase, LORazepam, ondansetron (ZOFRAN) IV, ondansetron, oxyCODONE, polyethylene glycol  Assesment: She was admitted with acute respiratory failure with hypoxia requiring BiPAP. She is improved. She has asthma and some element of COPD. She stopped smoking about 2 weeks ago and says that she intends to continue that. She has improved significantly. Principal Problem:   Acute respiratory failure with hypoxia Active Problems:   HYPERTENSION   Tobacco abuse   Asthma exacerbation   Pancreatitis   COPD exacerbation   DM type 2 (diabetes mellitus, type 2)    Plan: I will plan to follow somewhat more peripherally. She seems to be getting close to discharge. After I reviewed her home meds she is not on any maintenance medications for asthma or COPD. She uses duo neb  via nebulizer. She would probably be appropriate to be discharged home on something like Symbicort Advair or Dulera with the possibility of being switched to an inhaled steroid alone as an outpatient    LOS: 3 days   Ziaire Hagos L 03/30/2014, 8:37 AM

## 2014-03-30 NOTE — Progress Notes (Signed)
Inpatient Diabetes Program Recommendations  AACE/ADA: New Consensus Statement on Inpatient Glycemic Control (2013)  Target Ranges:  Prepandial:   less than 140 mg/dL      Peak postprandial:   less than 180 mg/dL (1-2 hours)      Critically ill patients:  140 - 180 mg/dL   Results for JODELL, [name] (MRN [id number]) as of 03/30/2014 08:40  Ref. Range 03/29/2014 07:37 03/29/2014 11:41 03/29/2014 18:00 03/29/2014 20:10 03/30/2014 07:28  Glucose-Capillary Latest Range: 70-99 mg/dL 211 (H) 276 (H) 243 (H) 168 (H) 212 (H)   Diabetes history: DM2 Outpatient Diabetes medications: Metformin 500 mg BID Current orders for Inpatient glycemic control: Novoog 0-20 units AC, Novolog 0-5 units HS  Inpatient Diabetes Program Recommendations Insulin - Basal: If steroids are continued, please consider ordering low dose Levemir. Recommend starting with Levemir 7 units daily (based on 70 kg x 0.1 unis). If Levemir is started, it will likely need to be adjusted as steroids are tapered. Insulin - Meal Coverage: If steroids are continued, please consider ordering Novolog 4 units TID with meals for meal coverage. A1C: Please consider ordering an A1C to evaluate glycemic control over the past 2-3 months.  Thanks, Barnie Alderman, RN, MSN, CCRN Diabetes Coordinator Inpatient Diabetes Program [phone] (Team Pager) [phone] ([place] office) [phone] [place] office)

## 2014-04-03 DIAGNOSIS — N390 Urinary tract infection, site not specified: Secondary | ICD-10-CM | POA: Diagnosis not present

## 2014-04-07 ENCOUNTER — Emergency Department (HOSPITAL_COMMUNITY)
Admission: EM | Admit: 2014-04-07 | Discharge: 2014-04-07 | Disposition: A | Discharge status: Home / Self Care | Payer: Medicare Other | Attending: Emergency Medicine | Admitting: Emergency Medicine

## 2014-04-07 ENCOUNTER — Encounter (HOSPITAL_COMMUNITY): Payer: Self-pay | Admitting: Emergency Medicine

## 2014-04-07 DIAGNOSIS — E119 Type 2 diabetes mellitus without complications: Secondary | ICD-10-CM | POA: Insufficient documentation

## 2014-04-07 DIAGNOSIS — G43909 Migraine, unspecified, not intractable, without status migrainosus: Secondary | ICD-10-CM | POA: Insufficient documentation

## 2014-04-07 DIAGNOSIS — Z87891 Personal history of nicotine dependence: Secondary | ICD-10-CM | POA: Insufficient documentation

## 2014-04-07 DIAGNOSIS — Z90710 Acquired absence of both cervix and uterus: Secondary | ICD-10-CM | POA: Diagnosis not present

## 2014-04-07 DIAGNOSIS — F411 Generalized anxiety disorder: Secondary | ICD-10-CM | POA: Diagnosis not present

## 2014-04-07 DIAGNOSIS — F329 Major depressive disorder, single episode, unspecified: Secondary | ICD-10-CM | POA: Diagnosis not present

## 2014-04-07 DIAGNOSIS — N949 Unspecified condition associated with female genital organs and menstrual cycle: Secondary | ICD-10-CM | POA: Diagnosis not present

## 2014-04-07 DIAGNOSIS — Z880 Allergy status to penicillin: Secondary | ICD-10-CM | POA: Insufficient documentation

## 2014-04-07 DIAGNOSIS — F3289 Other specified depressive episodes: Secondary | ICD-10-CM | POA: Diagnosis not present

## 2014-04-07 DIAGNOSIS — Z79899 Other long term (current) drug therapy: Secondary | ICD-10-CM | POA: Diagnosis not present

## 2014-04-07 DIAGNOSIS — Z792 Long term (current) use of antibiotics: Secondary | ICD-10-CM | POA: Diagnosis not present

## 2014-04-07 DIAGNOSIS — J449 Chronic obstructive pulmonary disease, unspecified: Secondary | ICD-10-CM | POA: Diagnosis not present

## 2014-04-07 DIAGNOSIS — G8929 Other chronic pain: Secondary | ICD-10-CM | POA: Diagnosis not present

## 2014-04-07 DIAGNOSIS — R102 Pelvic and perineal pain: Secondary | ICD-10-CM

## 2014-04-07 DIAGNOSIS — Z9089 Acquired absence of other organs: Secondary | ICD-10-CM | POA: Diagnosis not present

## 2014-04-07 DIAGNOSIS — R739 Hyperglycemia, unspecified: Secondary | ICD-10-CM

## 2014-04-07 DIAGNOSIS — IMO0002 Reserved for concepts with insufficient information to code with codable children: Secondary | ICD-10-CM | POA: Diagnosis not present

## 2014-04-07 DIAGNOSIS — J4489 Other specified chronic obstructive pulmonary disease: Secondary | ICD-10-CM | POA: Diagnosis not present

## 2014-04-07 DIAGNOSIS — Z862 Personal history of diseases of the blood and blood-forming organs and certain disorders involving the immune mechanism: Secondary | ICD-10-CM | POA: Insufficient documentation

## 2014-04-07 LAB — COMPREHENSIVE METABOLIC PANEL
ALBUMIN: 3.6 g/dL (ref 3.5–5.2)
ALT: 27 U/L (ref 0–35)
AST: 17 U/L (ref 0–37)
Alkaline Phosphatase: 77 U/L (ref 39–117)
Anion gap: 9 (ref 5–15)
BUN: 10 mg/dL (ref 6–23)
CO2: 32 mEq/L (ref 19–32)
Calcium: 9.1 mg/dL (ref 8.4–10.5)
Chloride: 95 mEq/L — ABNORMAL LOW (ref 96–112)
Creatinine, Ser: 0.66 mg/dL (ref 0.50–1.10)
GFR calc Af Amer: >90 mL/min (ref >90)
GFR calc non Af Amer: >90 mL/min (ref >90)
Glucose, Bld: 260 mg/dL — ABNORMAL HIGH (ref 70–99)
POTASSIUM: 4 meq/L (ref 3.7–5.3)
SODIUM: 136 meq/L — AB (ref 137–147)
TOTAL PROTEIN: 6.4 g/dL (ref 6.0–8.3)
Total Bilirubin: 0.2 mg/dL — ABNORMAL LOW (ref 0.3–1.2)

## 2014-04-07 LAB — CBG MONITORING, ED: Glucose-Capillary: 265 mg/dL — ABNORMAL HIGH (ref 70–99)

## 2014-04-07 LAB — CBC
HCT: 38.7 % (ref 36.0–46.0)
HEMOGLOBIN: 13 g/dL (ref 12.0–15.0)
MCH: 30.2 pg (ref 26.0–34.0)
MCHC: 33.6 g/dL (ref 30.0–36.0)
MCV: 90 fL (ref 78.0–100.0)
Platelets: 239 10*3/uL (ref 150–400)
RBC: 4.3 MIL/uL (ref 3.87–5.11)
RDW: 12.4 % (ref 11.5–15.5)
WBC: 17.5 10*3/uL — ABNORMAL HIGH (ref 4.0–10.5)

## 2014-04-07 LAB — URINALYSIS, ROUTINE W REFLEX MICROSCOPIC
BILIRUBIN URINE: NEGATIVE
Glucose, UA: >1000 mg/dL — AB
HGB URINE DIPSTICK: NEGATIVE
Ketones, ur: NEGATIVE mg/dL
Leukocytes, UA: NEGATIVE
Nitrite: NEGATIVE
PH: 7 (ref 5.0–8.0)
Protein, ur: NEGATIVE mg/dL
SPECIFIC GRAVITY, URINE: 1.01 (ref 1.005–1.030)
Urobilinogen, UA: 0.2 mg/dL (ref 0.0–1.0)

## 2014-04-07 LAB — URINE MICROSCOPIC-ADD ON

## 2014-04-07 MED ORDER — ESTROGENS, CONJUGATED 0.625 MG/GM VA CREA: TOPICAL_CREAM | VAGINAL | Status: DC

## 2014-04-07 NOTE — Discharge Instructions (Signed)
Hyperglycemia °Hyperglycemia occurs when the glucose (sugar) in your blood is too high. Hyperglycemia can happen for many reasons, but it most often happens to people who do not know they have diabetes or are not managing their diabetes properly.  °CAUSES  °Whether you have diabetes or not, there are other causes of hyperglycemia. Hyperglycemia can occur when you have diabetes, but it can also occur in other situations that you might not be as aware of, such as: °Diabetes °· If you have diabetes and are having problems controlling your blood glucose, hyperglycemia could occur because of some of the following reasons: °¨ Not following your meal plan. °¨ Not taking your diabetes medications or not taking it properly. °¨ Exercising less or doing less activity than you normally do. °¨ Being sick. °Pre-diabetes °· This cannot be ignored. Before people develop Type 2 diabetes, they almost always have "pre-diabetes." This is when your blood glucose levels are higher than normal, but not yet high enough to be diagnosed as diabetes. Research has shown that some long-term damage to the body, especially the heart and circulatory system, may already be occurring during pre-diabetes. If you take action to manage your blood glucose when you have pre-diabetes, you may delay or prevent Type 2 diabetes from developing. °Stress °· If you have diabetes, you may be "diet" controlled or on oral medications or insulin to control your diabetes. However, you may find that your blood glucose is higher than usual in the hospital whether you have diabetes or not. This is often referred to as "stress hyperglycemia." Stress can elevate your blood glucose. This happens because of hormones put out by the body during times of stress. If stress has been the cause of your high blood glucose, it can be followed regularly by your caregiver. That way he/she can make sure your hyperglycemia does not continue to get worse or progress to  diabetes. °Steroids °· Steroids are medications that act on the infection fighting system (immune system) to block inflammation or infection. One side effect can be a rise in blood glucose. Most people can produce enough extra insulin to allow for this rise, but for those who cannot, steroids make blood glucose levels go even higher. It is not unusual for steroid treatments to "uncover" diabetes that is developing. It is not always possible to determine if the hyperglycemia will go away after the steroids are stopped. A special blood test called an A1c is sometimes done to determine if your blood glucose was elevated before the steroids were started. °SYMPTOMS °· Thirsty. °· Frequent urination. °· Dry mouth. °· Blurred vision. °· Tired or fatigue. °· Weakness. °· Sleepy. °· Tingling in feet or leg. °DIAGNOSIS  °Diagnosis is made by monitoring blood glucose in one or all of the following ways: °· A1c test. This is a chemical found in your blood. °· Fingerstick blood glucose monitoring. °· Laboratory results. °TREATMENT  °First, knowing the cause of the hyperglycemia is important before the hyperglycemia can be treated. Treatment may include, but is not be limited to: °· Education. °· Change or adjustment in medications. °· Change or adjustment in meal plan. °· Treatment for an illness, infection, etc. °· More frequent blood glucose monitoring. °· Change in exercise plan. °· Decreasing or stopping steroids. °· Lifestyle changes. °HOME CARE INSTRUCTIONS  °· Test your blood glucose as directed. °· Exercise regularly. Your caregiver will give you instructions about exercise. Pre-diabetes or diabetes which comes on with stress is helped by exercising. °· Eat wholesome,   balanced meals. Eat often and at regular, fixed times. Your caregiver or nutritionist will give you a meal plan to guide your sugar intake.  Being at an ideal weight is important. If needed, losing as little as 10 to 15 pounds may help improve blood  glucose levels. SEEK MEDICAL CARE IF:   You have questions about medicine, activity, or diet.  You continue to have symptoms (problems such as increased thirst, urination, or weight gain). SEEK IMMEDIATE MEDICAL CARE IF:   You are vomiting or have diarrhea.  Your breath smells fruity.  You are breathing faster or slower.  You are very sleepy or incoherent.  You have numbness, tingling, or pain in your feet or hands.  You have chest pain.  Your symptoms get worse even though you have been following your caregiver's orders.  If you have any other questions or concerns. Document Released: 01/06/2001 Document Revised: 10/05/2011 Document Reviewed: 11/09/2011 [facility] Patient Information 2015 [place], [place]. This information is not intended to replace advice given to you by your health care provider. Make sure you discuss any questions you have with your health care provider.  Pelvic Pain Female pelvic pain can be caused by many different things and start from a variety of places. Pelvic pain refers to pain that is located in the lower half of the abdomen and between your hips. The pain may occur over a short period of time (acute) or may be reoccurring (chronic). The cause of pelvic pain may be related to disorders affecting the female reproductive organs (gynecologic), but it may also be related to the bladder, kidney stones, an intestinal complication, or muscle or skeletal problems. Getting help right away for pelvic pain is important, especially if there has been severe, sharp, or a sudden onset of unusual pain. It is also important to get help right away because some types of pelvic pain can be life threatening.  CAUSES  Below are only some of the causes of pelvic pain. The causes of pelvic pain can be in one of several categories.   Gynecologic.  Pelvic inflammatory disease.  Sexually transmitted infection.  Ovarian cyst or a twisted ovarian ligament (ovarian  torsion).  Uterine lining that grows outside the uterus (endometriosis).  Fibroids, cysts, or tumors.  Ovulation.  Pregnancy.  Pregnancy that occurs outside the uterus (ectopic pregnancy).  Miscarriage.  Labor.  Abruption of the placenta or ruptured uterus.  Infection.  Uterine infection (endometritis).  Bladder infection.  Diverticulitis.  Miscarriage related to a uterine infection (septic abortion).  Bladder.  Inflammation of the bladder (cystitis).  Kidney stone(s).  Gastrointestinal.  Constipation.  Diverticulitis.  Neurologic.  Trauma.  Feeling pelvic pain because of mental or emotional causes (psychosomatic).  Cancers of the bowel or pelvis. EVALUATION  Your caregiver will want to take a careful history of your concerns. This includes recent changes in your health, a careful gynecologic history of your periods (menses), and a sexual history. Obtaining your family history and medical history is also important. Your caregiver may suggest a pelvic exam. A pelvic exam will help identify the location and severity of the pain. It also helps in the evaluation of which organ system may be involved. In order to identify the cause of the pelvic pain and be properly treated, your caregiver may order tests. These tests may include:   A pregnancy test.  Pelvic ultrasonography.  An X-ray exam of the abdomen.  A urinalysis or evaluation of vaginal discharge.  Blood tests. HOME CARE INSTRUCTIONS  Only take over-the-counter or prescription medicines for pain, discomfort, or fever as directed by your caregiver.   Rest as directed by your caregiver.   Eat a balanced diet.   Drink enough fluids to make your urine clear or pale yellow, or as directed.   Avoid sexual intercourse if it causes pain.   Apply warm or cold compresses to the lower abdomen depending on which one helps the pain.   Avoid stressful situations.   Keep a journal of your pelvic  pain. Write down when it started, where the pain is located, and if there are things that seem to be associated with the pain, such as food or your menstrual cycle.  Follow up with your caregiver as directed.  SEEK MEDICAL CARE IF:  Your medicine does not help your pain.  You have abnormal vaginal discharge. SEEK IMMEDIATE MEDICAL CARE IF:   You have heavy bleeding from the vagina.   Your pelvic pain increases.   You feel light-headed or faint.   You have chills.   You have pain with urination or blood in your urine.   You have uncontrolled diarrhea or vomiting.   You have a fever or persistent symptoms for more than 3 days.  You have a fever and your symptoms suddenly get worse.   You are being physically or sexually abused.  MAKE SURE YOU:  Understand these instructions.  Will watch your condition.  Will get help if you are not doing well or get worse. Document Released: 06/09/2004 Document Revised: 11/27/2013 Document Reviewed: 11/02/2011 [facility] Patient Information 2015 [place], [place]. This information is not intended to replace advice given to you by your health care provider. Make sure you discuss any questions you have with your health care provider.

## 2014-04-07 NOTE — ED Provider Notes (Signed)
CSN: [id number]     Arrival date & time 04/07/14  1321 History   First MD Initiated Contact with Patient 04/07/14 1420    This chart was scribed for Orpah Greek, * by Evelene Croon, ED Scribe. This patient was seen in room APA17/APA17 and the patient's care was started 2:24 PM.  Chief Complaint  Patient presents with  . Hyperglycemia     The history is provided by the patient.   HPI Comments:  [name] is a 43 y.o. female with a h/o NIDDM, who presents to the Emergency Department complaining of hyperglycemia that she noticed this am when she took her blood sugar. Pt states she became concerned when after taking her metformin her blood sugar was still high. Pt also complains of lower abdominal pain that she's been experiencing for about one month. Pt describes pain as a pressure. Pt was recently hospitalized for pneumonia and while admitted she had a foley cath placed. Since it was removed the pressure has worsened and she now feels a bulging from her vagina. Reports h/o bladder prolapse. No alleviating factors noted.     Past Medical History  Diagnosis Date  . Asthma   . Depression   . Migraine   . Pancreatitis chronic Idiopathic    Treated by Dr. Newman Pies at [facility] in the past with celiac blocks.  . Bipolar 1 disorder   . Hyperlipidemia 02/03/2012  . Diabetes mellitus   . COPD (chronic obstructive pulmonary disease)   . Neck pain 06/08/2012  . Chronic abdominal pain   . Anemia   . Anxiety   . Hiatal hernia   . Esophagitis    Past Surgical History  Procedure Laterality Date  . Cholecystectomy    . Tonsillectomy    . Abdominal hysterectomy    . Esophagogastroduodenoscopy  02/20/2008    DJS:HFWYOVZCH distal esophageal mucosa, suspicious for neoplasm/Hiatal hernia otherwise normal   . Esophagogastroduodenoscopy  04/03/2008    YIF:[id number]-NOMVE hiatal hernia, otherwise normal/Short, tight, benign-appearing peptic stricture  . Ileocolonoscopy   06/05/2008      [facility]:NOBSJGG anal canal, otherwise normal rectum, colon  . Esophagogastroduodenoscopy  November 16, 2012    Dr. Britta Mccreedy: hiatal hernia, esophagitis,   . Givens capsule study N/A 12/05/2012    Few superficial erosions but nothing found to explain iron deficiency anemia.   . Colonoscopy N/A 12/05/2012    EZM:OQHUTM rectum, colon and terminal ileum  . Celiac plexus block     Family History  Problem Relation Age of Onset  . Asthma Other   . Coronary artery disease Neg Hx   . Colon cancer Neg Hx   . Asthma Paternal Grandfather    History  Substance Use Topics  . Smoking status: Former Smoker -- 1.00 packs/day for 15 years    Types: Cigarettes  . Smokeless tobacco: Former Systems developer  . Alcohol Use: No   OB History   Grav Para Term Preterm Abortions TAB SAB Ect Mult Living                 Review of Systems  Gastrointestinal: Positive for abdominal pain.  All other systems reviewed and are negative.     Allergies  Penicillins; Sulfa antibiotics; and Prednisone  Home Medications   Prior to Admission medications   Medication Sig Start Date End Date Taking? Authorizing Provider  albuterol (PROVENTIL HFA;VENTOLIN HFA) 108 (90 BASE) MCG/ACT inhaler Inhale 2 puffs into the lungs daily as needed. For shortness of breath  Historical Provider, MD  amitriptyline (ELAVIL) 75 MG tablet Take 75 mg by mouth at bedtime.    Historical Provider, MD  amLODipine (NORVASC) 10 MG tablet Take 10 mg by mouth at bedtime.     Historical Provider, MD  budesonide-formoterol (SYMBICORT) 80-4.5 MCG/ACT inhaler Inhale 2 puffs into the lungs 2 (two) times daily. 03/30/14   Samuella Cota, MD  docusate sodium (COLACE) 100 MG capsule Take 100 mg by mouth at bedtime.     Historical Provider, MD  ferrous sulfate 325 (65 FE) MG tablet Take 325 mg by mouth daily.    Historical Provider, MD  hydrOXYzine (VISTARIL) 25 MG capsule Take 25 mg by mouth 4 (four) times daily.    Historical Provider, MD   ipratropium-albuterol (DUONEB) 0.5-2.5 (3) MG/3ML SOLN Inhale 3 mLs into the lungs every 6 (six) hours as needed. Take 3 mLs by nebulization every 6 (six) hours as needed for Wheezing. 07/01/13   Shanker Kristeen Mans, MD  levofloxacin (LEVAQUIN) 500 MG tablet Take 1 tablet (500 mg total) by mouth daily. Take in the evening, start 9/4. 03/30/14   Samuella Cota, MD  LORazepam (ATIVAN) 0.5 MG tablet Take 0.5 mg by mouth every 8 (eight) hours as needed for anxiety.     Historical Provider, MD  metFORMIN (GLUCOPHAGE) 500 MG tablet Take 500 mg by mouth 2 (two) times daily with a meal.  07/01/13   Shanker Kristeen Mans, MD  nicotine (NICODERM CQ - DOSED IN MG/24 HOURS) 14 mg/24hr patch Place 1 patch (14 mg total) onto the skin daily. 02/20/14   Radene Gunning, NP  oxyCODONE (ROXICODONE) 15 MG immediate release tablet Take 15 mg by mouth every 4 (four) hours as needed for pain.    Historical Provider, MD  Pancrelipase, Lip-Prot-Amyl, (ZENPEP) 20000 UNITS CPEP Take 2-3 capsules by mouth 5 (five) times daily. 3 capsules three times daily with each meal and 2 capsules with each snack.    Historical Provider, MD  pantoprazole (PROTONIX) 40 MG tablet Take 40 mg by mouth 2 (two) times daily.    Historical Provider, MD  polyethylene glycol powder (GLYCOLAX/MIRALAX) powder Take 17 g by mouth daily as needed. for Constipation.    Historical Provider, MD  predniSONE (DELTASONE) 10 MG tablet Start 9/5 . Take 40 mg by mouth daily for 3 days, then take 20 mg by mouth daily for 3 days, then take 10 mg by mouth daily for 3 days, then stop. 03/30/14   Samuella Cota, MD  promethazine (PHENERGAN) 25 MG tablet TAKE (1) TABLET BY MOUTH EVERY SIX HOURS AS NEEDED FOR NAUSEA. 07/28/13   Mahala Menghini, PA-C   BP 127/77  Pulse 103  Temp(Src) 98 F (36.7 C) (Oral)  Resp 18  Ht 5\' 2"  (1.575 m)  Wt 153 lb (69.4 kg)  BMI 27.98 kg/m2  SpO2 96% Physical Exam  Nursing note and vitals reviewed. Constitutional: She is oriented to person,  place, and time. She appears well-developed and well-nourished. No distress.  HENT:  Head: Normocephalic and atraumatic.  Right Ear: Hearing normal.  Left Ear: Hearing normal.  Nose: Nose normal.  Mouth/Throat: Oropharynx is clear and moist and mucous membranes are normal.  Eyes: Conjunctivae and EOM are normal. Pupils are equal, round, and reactive to light.  Neck: Normal range of motion. Neck supple.  Cardiovascular: Regular rhythm, S1 normal and S2 normal.  Exam reveals no gallop and no friction rub.   No murmur heard. Pulmonary/Chest: Effort normal and breath sounds  normal. No respiratory distress. She exhibits no tenderness.  Abdominal: Normal appearance. There is no hepatosplenomegaly. There is no tenderness at McBurney's point and negative Murphy's sign. No hernia.  Suprapubic tenderness  Genitourinary:     Musculoskeletal: Normal range of motion.  Neurological: She is alert and oriented to person, place, and time. She has normal strength. No cranial nerve deficit or sensory deficit. Coordination normal. GCS eye subscore is 4. GCS verbal subscore is 5. GCS motor subscore is 6.  Skin: Skin is warm, dry and intact. No rash noted. No cyanosis.  Psychiatric: She has a normal mood and affect. Her speech is normal and behavior is normal. Thought content normal.    ED Course  Procedures (including critical care time)  DIAGNOSTIC STUDIES:  Oxygen Saturation is 96% on RA, normal by my interpretation.    COORDINATION OF CARE:  2:25 PM Discussed treatment plan with pt at bedside and pt agreed to plan.  Labs Review Labs Reviewed  CBG MONITORING, ED - Abnormal; Notable for the following:    Glucose-Capillary 265 (*)    All other components within normal limits  CBC  COMPREHENSIVE METABOLIC PANEL    Imaging Review No results found.   EKG Interpretation None      MDM   Final diagnoses:  None   pelvic pain, possible urethral prolapse  Reports elevated blood sugars  morning. She did take her metformin her blood sugar has come down into the 200 range. She does not require any specific intervention for this at this time. Patient complaining of vaginal and pelvic pain. This has been ongoing for a month. Patient was recently hospitalized for pneumonia and had a portacatheter for approximately 6 hours. After taking the catheter out, area around the urethra has been inflamed and very painful. Examination does reveal tenderness and some bulging posteriorly to the urethra, but no obvious prolapse. I suspect some partial or possibly intermittent prolapse. Urinalysis does not suggest infection, laboratories otherwise unremarkable. Patient does have followup scheduled with OB/GYN this week. We'll prescribe pain medication, topical estrogen.  I personally performed the services described in this documentation, which was scribed in my presence. The recorded information has been reviewed and is accurate.    Orpah Greek, MD 04/07/14 [phone]

## 2014-04-07 NOTE — ED Notes (Signed)
Patient with no complaints at this time. Respirations even and unlabored. Skin warm/dry. Discharge instructions reviewed with patient at this time. Patient given opportunity to voice concerns/ask questions. Patient discharged at this time and left Emergency Department with steady gait.   

## 2014-04-07 NOTE — ED Notes (Signed)
Pt reports high blood sugar today and reports difficulty urinating. Pt reports CBG at home was over 400. Pt also reports was dx with bladder prolapse and reports lower abdominal pain. Pt reports is supposed to follow-up with women's health on Tuesday.

## 2014-04-07 NOTE — ED Notes (Signed)
cbg in triage 265.

## 2014-04-07 NOTE — ED Notes (Signed)
Reports pelvic and lower back pain x 1 month w/feeling of protrusion in perineum. Also reports burning, stinging in perineum that is constant.

## 2014-04-10 DIAGNOSIS — B373 Candidiasis of vulva and vagina: Secondary | ICD-10-CM | POA: Diagnosis not present

## 2014-04-10 DIAGNOSIS — B3731 Acute candidiasis of vulva and vagina: Secondary | ICD-10-CM | POA: Diagnosis not present

## 2014-04-20 DIAGNOSIS — E039 Hypothyroidism, unspecified: Secondary | ICD-10-CM | POA: Diagnosis not present

## 2014-04-20 DIAGNOSIS — G8929 Other chronic pain: Secondary | ICD-10-CM | POA: Diagnosis not present

## 2014-04-20 DIAGNOSIS — IMO0001 Reserved for inherently not codable concepts without codable children: Secondary | ICD-10-CM | POA: Diagnosis not present

## 2014-04-20 DIAGNOSIS — Z23 Encounter for immunization: Secondary | ICD-10-CM | POA: Diagnosis not present

## 2014-04-20 DIAGNOSIS — Z6827 Body mass index (BMI) 27.0-27.9, adult: Secondary | ICD-10-CM | POA: Diagnosis not present

## 2014-05-23 DIAGNOSIS — R252 Cramp and spasm: Secondary | ICD-10-CM | POA: Diagnosis not present

## 2014-05-23 DIAGNOSIS — I10 Essential (primary) hypertension: Secondary | ICD-10-CM | POA: Diagnosis not present

## 2014-05-23 DIAGNOSIS — Z6827 Body mass index (BMI) 27.0-27.9, adult: Secondary | ICD-10-CM | POA: Diagnosis not present

## 2014-05-23 DIAGNOSIS — G894 Chronic pain syndrome: Secondary | ICD-10-CM | POA: Diagnosis not present

## 2014-06-04 DIAGNOSIS — G43111 Migraine with aura, intractable, with status migrainosus: Secondary | ICD-10-CM | POA: Diagnosis not present

## 2014-06-04 DIAGNOSIS — H6505 Acute serous otitis media, recurrent, left ear: Secondary | ICD-10-CM | POA: Diagnosis not present

## 2014-06-04 DIAGNOSIS — Z79899 Other long term (current) drug therapy: Secondary | ICD-10-CM | POA: Diagnosis not present

## 2014-06-04 DIAGNOSIS — G43101 Migraine with aura, not intractable, with status migrainosus: Secondary | ICD-10-CM | POA: Diagnosis not present

## 2014-06-04 DIAGNOSIS — I10 Essential (primary) hypertension: Secondary | ICD-10-CM | POA: Diagnosis not present

## 2014-06-19 DIAGNOSIS — I10 Essential (primary) hypertension: Secondary | ICD-10-CM | POA: Diagnosis not present

## 2014-06-19 DIAGNOSIS — Z6826 Body mass index (BMI) 26.0-26.9, adult: Secondary | ICD-10-CM | POA: Diagnosis not present

## 2014-06-19 DIAGNOSIS — E119 Type 2 diabetes mellitus without complications: Secondary | ICD-10-CM | POA: Diagnosis not present

## 2014-06-19 DIAGNOSIS — K5732 Diverticulitis of large intestine without perforation or abscess without bleeding: Secondary | ICD-10-CM | POA: Diagnosis not present

## 2014-06-23 DIAGNOSIS — M549 Dorsalgia, unspecified: Secondary | ICD-10-CM | POA: Diagnosis not present

## 2014-06-23 DIAGNOSIS — Z79899 Other long term (current) drug therapy: Secondary | ICD-10-CM | POA: Diagnosis not present

## 2014-06-23 DIAGNOSIS — K219 Gastro-esophageal reflux disease without esophagitis: Secondary | ICD-10-CM | POA: Diagnosis not present

## 2014-06-23 DIAGNOSIS — Z9049 Acquired absence of other specified parts of digestive tract: Secondary | ICD-10-CM | POA: Diagnosis not present

## 2014-06-23 DIAGNOSIS — Z90710 Acquired absence of both cervix and uterus: Secondary | ICD-10-CM | POA: Diagnosis not present

## 2014-06-23 DIAGNOSIS — R0602 Shortness of breath: Secondary | ICD-10-CM | POA: Diagnosis not present

## 2014-06-23 DIAGNOSIS — Z720 Tobacco use: Secondary | ICD-10-CM | POA: Diagnosis not present

## 2014-06-23 DIAGNOSIS — J45909 Unspecified asthma, uncomplicated: Secondary | ICD-10-CM | POA: Diagnosis not present

## 2014-06-23 DIAGNOSIS — F418 Other specified anxiety disorders: Secondary | ICD-10-CM | POA: Diagnosis not present

## 2014-06-23 DIAGNOSIS — I10 Essential (primary) hypertension: Secondary | ICD-10-CM | POA: Diagnosis not present

## 2014-06-23 DIAGNOSIS — J45901 Unspecified asthma with (acute) exacerbation: Secondary | ICD-10-CM | POA: Diagnosis not present

## 2014-06-23 DIAGNOSIS — J189 Pneumonia, unspecified organism: Secondary | ICD-10-CM | POA: Diagnosis not present

## 2014-06-28 DIAGNOSIS — K5660 Unspecified intestinal obstruction: Secondary | ICD-10-CM | POA: Diagnosis not present

## 2014-06-28 DIAGNOSIS — K5900 Constipation, unspecified: Secondary | ICD-10-CM | POA: Diagnosis not present

## 2014-06-28 DIAGNOSIS — K6389 Other specified diseases of intestine: Secondary | ICD-10-CM | POA: Diagnosis not present

## 2014-06-28 DIAGNOSIS — T3995XA Adverse effect of unspecified nonopioid analgesic, antipyretic and antirheumatic, initial encounter: Secondary | ICD-10-CM | POA: Diagnosis not present

## 2014-06-28 DIAGNOSIS — I10 Essential (primary) hypertension: Secondary | ICD-10-CM | POA: Diagnosis not present

## 2014-06-28 DIAGNOSIS — R109 Unspecified abdominal pain: Secondary | ICD-10-CM | POA: Diagnosis not present

## 2014-06-28 DIAGNOSIS — Z882 Allergy status to sulfonamides status: Secondary | ICD-10-CM | POA: Diagnosis not present

## 2014-06-28 DIAGNOSIS — R112 Nausea with vomiting, unspecified: Secondary | ICD-10-CM | POA: Diagnosis not present

## 2014-06-28 DIAGNOSIS — R1013 Epigastric pain: Secondary | ICD-10-CM | POA: Diagnosis not present

## 2014-06-28 DIAGNOSIS — Z79899 Other long term (current) drug therapy: Secondary | ICD-10-CM | POA: Diagnosis not present

## 2014-06-28 DIAGNOSIS — K219 Gastro-esophageal reflux disease without esophagitis: Secondary | ICD-10-CM | POA: Diagnosis not present

## 2014-06-28 DIAGNOSIS — K449 Diaphragmatic hernia without obstruction or gangrene: Secondary | ICD-10-CM | POA: Diagnosis not present

## 2014-06-28 DIAGNOSIS — Z880 Allergy status to penicillin: Secondary | ICD-10-CM | POA: Diagnosis not present

## 2014-06-28 DIAGNOSIS — F17200 Nicotine dependence, unspecified, uncomplicated: Secondary | ICD-10-CM | POA: Diagnosis not present

## 2014-06-28 DIAGNOSIS — E119 Type 2 diabetes mellitus without complications: Secondary | ICD-10-CM | POA: Diagnosis not present

## 2014-06-28 DIAGNOSIS — Z888 Allergy status to other drugs, medicaments and biological substances status: Secondary | ICD-10-CM | POA: Diagnosis not present

## 2014-06-28 DIAGNOSIS — G43909 Migraine, unspecified, not intractable, without status migrainosus: Secondary | ICD-10-CM | POA: Diagnosis not present

## 2014-06-29 DIAGNOSIS — K449 Diaphragmatic hernia without obstruction or gangrene: Secondary | ICD-10-CM | POA: Diagnosis not present

## 2014-06-29 DIAGNOSIS — K5900 Constipation, unspecified: Secondary | ICD-10-CM | POA: Diagnosis not present

## 2014-06-29 DIAGNOSIS — R109 Unspecified abdominal pain: Secondary | ICD-10-CM | POA: Diagnosis not present

## 2014-06-29 DIAGNOSIS — K6389 Other specified diseases of intestine: Secondary | ICD-10-CM | POA: Diagnosis not present

## 2014-06-29 DIAGNOSIS — R112 Nausea with vomiting, unspecified: Secondary | ICD-10-CM | POA: Diagnosis not present

## 2014-06-29 DIAGNOSIS — R1013 Epigastric pain: Secondary | ICD-10-CM | POA: Diagnosis not present

## 2014-06-30 DIAGNOSIS — R109 Unspecified abdominal pain: Secondary | ICD-10-CM | POA: Diagnosis not present

## 2014-07-01 DIAGNOSIS — K5660 Unspecified intestinal obstruction: Secondary | ICD-10-CM | POA: Diagnosis not present

## 2014-07-09 DIAGNOSIS — Z6825 Body mass index (BMI) 25.0-25.9, adult: Secondary | ICD-10-CM | POA: Diagnosis not present

## 2014-07-09 DIAGNOSIS — G894 Chronic pain syndrome: Secondary | ICD-10-CM | POA: Diagnosis not present

## 2014-09-16 DIAGNOSIS — Z79899 Other long term (current) drug therapy: Secondary | ICD-10-CM | POA: Diagnosis not present

## 2014-09-16 DIAGNOSIS — Z825 Family history of asthma and other chronic lower respiratory diseases: Secondary | ICD-10-CM | POA: Diagnosis not present

## 2014-09-16 DIAGNOSIS — G43909 Migraine, unspecified, not intractable, without status migrainosus: Secondary | ICD-10-CM | POA: Diagnosis not present

## 2014-09-16 DIAGNOSIS — H6692 Otitis media, unspecified, left ear: Secondary | ICD-10-CM | POA: Diagnosis not present

## 2014-09-16 DIAGNOSIS — J9601 Acute respiratory failure with hypoxia: Secondary | ICD-10-CM | POA: Diagnosis not present

## 2014-09-16 DIAGNOSIS — I10 Essential (primary) hypertension: Secondary | ICD-10-CM | POA: Diagnosis not present

## 2014-09-16 DIAGNOSIS — J45901 Unspecified asthma with (acute) exacerbation: Secondary | ICD-10-CM | POA: Diagnosis not present

## 2014-09-16 DIAGNOSIS — F329 Major depressive disorder, single episode, unspecified: Secondary | ICD-10-CM | POA: Diagnosis not present

## 2014-09-16 DIAGNOSIS — D509 Iron deficiency anemia, unspecified: Secondary | ICD-10-CM | POA: Diagnosis not present

## 2014-09-16 DIAGNOSIS — E119 Type 2 diabetes mellitus without complications: Secondary | ICD-10-CM | POA: Diagnosis not present

## 2014-09-16 DIAGNOSIS — F17200 Nicotine dependence, unspecified, uncomplicated: Secondary | ICD-10-CM | POA: Diagnosis not present

## 2014-09-16 DIAGNOSIS — Z8701 Personal history of pneumonia (recurrent): Secondary | ICD-10-CM | POA: Diagnosis not present

## 2014-09-16 DIAGNOSIS — J209 Acute bronchitis, unspecified: Secondary | ICD-10-CM | POA: Diagnosis not present

## 2014-09-16 DIAGNOSIS — J440 Chronic obstructive pulmonary disease with acute lower respiratory infection: Secondary | ICD-10-CM | POA: Diagnosis not present

## 2014-09-16 DIAGNOSIS — J441 Chronic obstructive pulmonary disease with (acute) exacerbation: Secondary | ICD-10-CM | POA: Diagnosis not present

## 2014-09-16 DIAGNOSIS — J40 Bronchitis, not specified as acute or chronic: Secondary | ICD-10-CM | POA: Diagnosis not present

## 2014-09-16 DIAGNOSIS — K219 Gastro-esophageal reflux disease without esophagitis: Secondary | ICD-10-CM | POA: Diagnosis not present

## 2014-09-16 DIAGNOSIS — Z8249 Family history of ischemic heart disease and other diseases of the circulatory system: Secondary | ICD-10-CM | POA: Diagnosis not present

## 2014-09-16 DIAGNOSIS — Z880 Allergy status to penicillin: Secondary | ICD-10-CM | POA: Diagnosis not present

## 2014-09-17 DIAGNOSIS — J45901 Unspecified asthma with (acute) exacerbation: Secondary | ICD-10-CM | POA: Diagnosis not present

## 2014-09-19 ENCOUNTER — Other Ambulatory Visit: Payer: Self-pay | Admitting: Internal Medicine

## 2014-09-19 DIAGNOSIS — R921 Mammographic calcification found on diagnostic imaging of breast: Secondary | ICD-10-CM

## 2014-09-26 ENCOUNTER — Other Ambulatory Visit: Payer: Self-pay | Admitting: Internal Medicine

## 2014-09-26 DIAGNOSIS — R921 Mammographic calcification found on diagnostic imaging of breast: Secondary | ICD-10-CM

## 2014-09-27 ENCOUNTER — Ambulatory Visit
Admission: RE | Admit: 2014-09-27 | Discharge: 2014-09-27 | Disposition: A | Discharge status: Home / Self Care | Payer: Medicare Other | Source: Ambulatory Visit | Attending: Internal Medicine | Admitting: Internal Medicine

## 2014-09-27 DIAGNOSIS — R921 Mammographic calcification found on diagnostic imaging of breast: Secondary | ICD-10-CM

## 2014-09-27 DIAGNOSIS — N6489 Other specified disorders of breast: Secondary | ICD-10-CM | POA: Diagnosis not present

## 2014-10-01 DIAGNOSIS — K861 Other chronic pancreatitis: Secondary | ICD-10-CM | POA: Diagnosis not present

## 2014-10-01 DIAGNOSIS — I10 Essential (primary) hypertension: Secondary | ICD-10-CM | POA: Diagnosis not present

## 2014-10-01 DIAGNOSIS — E782 Mixed hyperlipidemia: Secondary | ICD-10-CM | POA: Diagnosis not present

## 2014-10-01 DIAGNOSIS — R252 Cramp and spasm: Secondary | ICD-10-CM | POA: Diagnosis not present

## 2014-10-01 DIAGNOSIS — E063 Autoimmune thyroiditis: Secondary | ICD-10-CM | POA: Diagnosis not present

## 2014-10-01 DIAGNOSIS — G894 Chronic pain syndrome: Secondary | ICD-10-CM | POA: Diagnosis not present

## 2014-10-01 DIAGNOSIS — Z6825 Body mass index (BMI) 25.0-25.9, adult: Secondary | ICD-10-CM | POA: Diagnosis not present

## 2014-10-01 DIAGNOSIS — E119 Type 2 diabetes mellitus without complications: Secondary | ICD-10-CM | POA: Diagnosis not present

## 2014-10-01 DIAGNOSIS — G629 Polyneuropathy, unspecified: Secondary | ICD-10-CM | POA: Diagnosis not present

## 2014-10-02 DIAGNOSIS — E119 Type 2 diabetes mellitus without complications: Secondary | ICD-10-CM | POA: Diagnosis not present

## 2014-10-02 DIAGNOSIS — G629 Polyneuropathy, unspecified: Secondary | ICD-10-CM | POA: Diagnosis not present

## 2014-10-02 DIAGNOSIS — K861 Other chronic pancreatitis: Secondary | ICD-10-CM | POA: Diagnosis not present

## 2014-10-06 DIAGNOSIS — R1013 Epigastric pain: Secondary | ICD-10-CM | POA: Diagnosis not present

## 2014-10-06 DIAGNOSIS — Z8249 Family history of ischemic heart disease and other diseases of the circulatory system: Secondary | ICD-10-CM | POA: Diagnosis not present

## 2014-10-06 DIAGNOSIS — J449 Chronic obstructive pulmonary disease, unspecified: Secondary | ICD-10-CM | POA: Diagnosis not present

## 2014-10-06 DIAGNOSIS — F17200 Nicotine dependence, unspecified, uncomplicated: Secondary | ICD-10-CM | POA: Diagnosis not present

## 2014-10-06 DIAGNOSIS — Z825 Family history of asthma and other chronic lower respiratory diseases: Secondary | ICD-10-CM | POA: Diagnosis not present

## 2014-10-06 DIAGNOSIS — I10 Essential (primary) hypertension: Secondary | ICD-10-CM | POA: Diagnosis not present

## 2014-10-06 DIAGNOSIS — E119 Type 2 diabetes mellitus without complications: Secondary | ICD-10-CM | POA: Diagnosis not present

## 2014-10-06 DIAGNOSIS — Z888 Allergy status to other drugs, medicaments and biological substances status: Secondary | ICD-10-CM | POA: Diagnosis not present

## 2014-10-06 DIAGNOSIS — J40 Bronchitis, not specified as acute or chronic: Secondary | ICD-10-CM | POA: Diagnosis not present

## 2014-10-06 DIAGNOSIS — N390 Urinary tract infection, site not specified: Secondary | ICD-10-CM | POA: Diagnosis not present

## 2014-10-06 DIAGNOSIS — Z8719 Personal history of other diseases of the digestive system: Secondary | ICD-10-CM | POA: Diagnosis not present

## 2014-10-07 DIAGNOSIS — J40 Bronchitis, not specified as acute or chronic: Secondary | ICD-10-CM | POA: Diagnosis not present

## 2014-11-13 DIAGNOSIS — J45909 Unspecified asthma, uncomplicated: Secondary | ICD-10-CM | POA: Diagnosis not present

## 2014-11-13 DIAGNOSIS — G43909 Migraine, unspecified, not intractable, without status migrainosus: Secondary | ICD-10-CM | POA: Diagnosis not present

## 2014-11-13 DIAGNOSIS — J441 Chronic obstructive pulmonary disease with (acute) exacerbation: Secondary | ICD-10-CM | POA: Diagnosis not present

## 2014-11-13 DIAGNOSIS — F329 Major depressive disorder, single episode, unspecified: Secondary | ICD-10-CM | POA: Diagnosis not present

## 2014-11-13 DIAGNOSIS — R05 Cough: Secondary | ICD-10-CM | POA: Diagnosis not present

## 2014-11-13 DIAGNOSIS — J449 Chronic obstructive pulmonary disease, unspecified: Secondary | ICD-10-CM | POA: Diagnosis not present

## 2014-11-13 DIAGNOSIS — E119 Type 2 diabetes mellitus without complications: Secondary | ICD-10-CM | POA: Diagnosis not present

## 2014-11-13 DIAGNOSIS — R0602 Shortness of breath: Secondary | ICD-10-CM | POA: Diagnosis not present

## 2014-11-13 DIAGNOSIS — F419 Anxiety disorder, unspecified: Secondary | ICD-10-CM | POA: Diagnosis not present

## 2014-11-13 DIAGNOSIS — J45901 Unspecified asthma with (acute) exacerbation: Secondary | ICD-10-CM | POA: Diagnosis not present

## 2014-11-14 DIAGNOSIS — Z888 Allergy status to other drugs, medicaments and biological substances status: Secondary | ICD-10-CM | POA: Diagnosis not present

## 2014-11-14 DIAGNOSIS — F329 Major depressive disorder, single episode, unspecified: Secondary | ICD-10-CM | POA: Diagnosis present

## 2014-11-14 DIAGNOSIS — J441 Chronic obstructive pulmonary disease with (acute) exacerbation: Secondary | ICD-10-CM | POA: Diagnosis present

## 2014-11-14 DIAGNOSIS — Z882 Allergy status to sulfonamides status: Secondary | ICD-10-CM | POA: Diagnosis not present

## 2014-11-14 DIAGNOSIS — J45901 Unspecified asthma with (acute) exacerbation: Secondary | ICD-10-CM | POA: Diagnosis not present

## 2014-11-14 DIAGNOSIS — D509 Iron deficiency anemia, unspecified: Secondary | ICD-10-CM | POA: Diagnosis present

## 2014-11-14 DIAGNOSIS — Z79899 Other long term (current) drug therapy: Secondary | ICD-10-CM | POA: Diagnosis not present

## 2014-11-14 DIAGNOSIS — F17200 Nicotine dependence, unspecified, uncomplicated: Secondary | ICD-10-CM | POA: Diagnosis present

## 2014-11-14 DIAGNOSIS — E119 Type 2 diabetes mellitus without complications: Secondary | ICD-10-CM | POA: Diagnosis present

## 2014-11-14 DIAGNOSIS — Z825 Family history of asthma and other chronic lower respiratory diseases: Secondary | ICD-10-CM | POA: Diagnosis not present

## 2014-11-14 DIAGNOSIS — F419 Anxiety disorder, unspecified: Secondary | ICD-10-CM | POA: Diagnosis present

## 2014-11-14 DIAGNOSIS — K219 Gastro-esophageal reflux disease without esophagitis: Secondary | ICD-10-CM | POA: Diagnosis present

## 2014-11-14 DIAGNOSIS — Z880 Allergy status to penicillin: Secondary | ICD-10-CM | POA: Diagnosis not present

## 2014-11-14 DIAGNOSIS — Z79891 Long term (current) use of opiate analgesic: Secondary | ICD-10-CM | POA: Diagnosis not present

## 2014-11-14 DIAGNOSIS — I10 Essential (primary) hypertension: Secondary | ICD-10-CM | POA: Diagnosis present

## 2014-11-14 DIAGNOSIS — G43909 Migraine, unspecified, not intractable, without status migrainosus: Secondary | ICD-10-CM | POA: Diagnosis present

## 2014-11-14 DIAGNOSIS — Z8249 Family history of ischemic heart disease and other diseases of the circulatory system: Secondary | ICD-10-CM | POA: Diagnosis not present

## 2014-11-28 DIAGNOSIS — J441 Chronic obstructive pulmonary disease with (acute) exacerbation: Secondary | ICD-10-CM | POA: Diagnosis not present

## 2014-11-28 DIAGNOSIS — G894 Chronic pain syndrome: Secondary | ICD-10-CM | POA: Diagnosis not present

## 2014-11-28 DIAGNOSIS — Z6822 Body mass index (BMI) 22.0-22.9, adult: Secondary | ICD-10-CM | POA: Diagnosis not present

## 2014-12-04 DIAGNOSIS — R109 Unspecified abdominal pain: Secondary | ICD-10-CM | POA: Diagnosis not present

## 2014-12-06 ENCOUNTER — Emergency Department (HOSPITAL_COMMUNITY)
Admission: EM | Admit: 2014-12-06 | Discharge: 2014-12-06 | Disposition: A | Discharge status: Home / Self Care | Payer: Medicare Other | Attending: Emergency Medicine | Admitting: Emergency Medicine

## 2014-12-06 ENCOUNTER — Emergency Department (HOSPITAL_COMMUNITY): Payer: Medicare Other

## 2014-12-06 ENCOUNTER — Encounter (HOSPITAL_COMMUNITY): Payer: Self-pay | Admitting: *Deleted

## 2014-12-06 DIAGNOSIS — R1013 Epigastric pain: Secondary | ICD-10-CM

## 2014-12-06 DIAGNOSIS — J449 Chronic obstructive pulmonary disease, unspecified: Secondary | ICD-10-CM | POA: Diagnosis not present

## 2014-12-06 DIAGNOSIS — E119 Type 2 diabetes mellitus without complications: Secondary | ICD-10-CM | POA: Diagnosis not present

## 2014-12-06 DIAGNOSIS — Z9049 Acquired absence of other specified parts of digestive tract: Secondary | ICD-10-CM | POA: Diagnosis not present

## 2014-12-06 DIAGNOSIS — Z880 Allergy status to penicillin: Secondary | ICD-10-CM | POA: Insufficient documentation

## 2014-12-06 DIAGNOSIS — Z79899 Other long term (current) drug therapy: Secondary | ICD-10-CM | POA: Insufficient documentation

## 2014-12-06 DIAGNOSIS — F419 Anxiety disorder, unspecified: Secondary | ICD-10-CM | POA: Diagnosis not present

## 2014-12-06 DIAGNOSIS — Z8679 Personal history of other diseases of the circulatory system: Secondary | ICD-10-CM | POA: Insufficient documentation

## 2014-12-06 DIAGNOSIS — Z90710 Acquired absence of both cervix and uterus: Secondary | ICD-10-CM | POA: Diagnosis not present

## 2014-12-06 DIAGNOSIS — F319 Bipolar disorder, unspecified: Secondary | ICD-10-CM | POA: Insufficient documentation

## 2014-12-06 DIAGNOSIS — Z87891 Personal history of nicotine dependence: Secondary | ICD-10-CM | POA: Diagnosis not present

## 2014-12-06 DIAGNOSIS — D649 Anemia, unspecified: Secondary | ICD-10-CM | POA: Diagnosis not present

## 2014-12-06 DIAGNOSIS — Z9889 Other specified postprocedural states: Secondary | ICD-10-CM | POA: Diagnosis not present

## 2014-12-06 DIAGNOSIS — G8929 Other chronic pain: Secondary | ICD-10-CM | POA: Insufficient documentation

## 2014-12-06 DIAGNOSIS — K5900 Constipation, unspecified: Secondary | ICD-10-CM | POA: Insufficient documentation

## 2014-12-06 DIAGNOSIS — Z7951 Long term (current) use of inhaled steroids: Secondary | ICD-10-CM | POA: Diagnosis not present

## 2014-12-06 DIAGNOSIS — Z8719 Personal history of other diseases of the digestive system: Secondary | ICD-10-CM

## 2014-12-06 LAB — URINALYSIS, ROUTINE W REFLEX MICROSCOPIC
BILIRUBIN URINE: NEGATIVE
GLUCOSE, UA: 500 mg/dL — AB
Hgb urine dipstick: NEGATIVE
KETONES UR: NEGATIVE mg/dL
Leukocytes, UA: NEGATIVE
Nitrite: NEGATIVE
PH: 5.5 (ref 5.0–8.0)
Protein, ur: NEGATIVE mg/dL
Specific Gravity, Urine: 1.015 (ref 1.005–1.030)
Urobilinogen, UA: 0.2 mg/dL (ref 0.0–1.0)

## 2014-12-06 LAB — CBC
HEMATOCRIT: 38.2 % (ref 36.0–46.0)
Hemoglobin: 12.9 g/dL (ref 12.0–15.0)
MCH: 30.2 pg (ref 26.0–34.0)
MCHC: 33.8 g/dL (ref 30.0–36.0)
MCV: 89.5 fL (ref 78.0–100.0)
PLATELETS: 263 10*3/uL (ref 150–400)
RBC: 4.27 MIL/uL (ref 3.87–5.11)
RDW: 12.4 % (ref 11.5–15.5)
WBC: 18.8 10*3/uL — AB (ref 4.0–10.5)

## 2014-12-06 LAB — COMPREHENSIVE METABOLIC PANEL
ALK PHOS: 73 U/L (ref 38–126)
ALT: 43 U/L (ref 14–54)
AST: 30 U/L (ref 15–41)
Albumin: 3.4 g/dL — ABNORMAL LOW (ref 3.5–5.0)
Anion gap: 9 (ref 5–15)
BUN: 18 mg/dL (ref 6–20)
CALCIUM: 9 mg/dL (ref 8.9–10.3)
CO2: 27 mmol/L (ref 22–32)
CREATININE: 0.71 mg/dL (ref 0.44–1.00)
Chloride: 98 mmol/L — ABNORMAL LOW (ref 101–111)
Glucose, Bld: 163 mg/dL — ABNORMAL HIGH (ref 65–99)
Potassium: 4.5 mmol/L (ref 3.5–5.1)
SODIUM: 134 mmol/L — AB (ref 135–145)
Total Bilirubin: 0.3 mg/dL (ref 0.3–1.2)
Total Protein: 6.1 g/dL — ABNORMAL LOW (ref 6.5–8.1)

## 2014-12-06 LAB — LIPASE, BLOOD: Lipase: 22 U/L (ref 22–51)

## 2014-12-06 MED ORDER — GI COCKTAIL ~~LOC~~
30.0000 mL | Freq: Once | ORAL | Status: AC
  Administered 2014-12-06: 30 mL via ORAL
  Filled 2014-12-06: qty 30

## 2014-12-06 MED ORDER — IOHEXOL 300 MG/ML  SOLN
50.0000 mL | Freq: Once | INTRAMUSCULAR | Status: AC | PRN
  Administered 2014-12-06: 50 mL via ORAL

## 2014-12-06 MED ORDER — SODIUM CHLORIDE 0.9 % IV BOLUS (SEPSIS)
1000.0000 mL | Freq: Once | INTRAVENOUS | Status: AC
  Administered 2014-12-06: 1000 mL via INTRAVENOUS

## 2014-12-06 MED ORDER — IOHEXOL 300 MG/ML  SOLN
100.0000 mL | Freq: Once | INTRAMUSCULAR | Status: AC | PRN
  Administered 2014-12-06: 100 mL via INTRAVENOUS

## 2014-12-06 NOTE — Discharge Instructions (Signed)
Magnesium citrate: Drink entire 10 ounce bottle mixed with equal parts Sprite or Gatorade for relief of constipation.  Follow-up with your primary Dr. if not improving in the next week.   Abdominal Pain Many things can cause abdominal pain. Usually, abdominal pain is not caused by a disease and will improve without treatment. It can often be observed and treated at home. Your health care provider will do a physical exam and possibly order blood tests and X-rays to help determine the seriousness of your pain. However, in many cases, more time must pass before a clear cause of the pain can be found. Before that point, your health care provider may not know if you need more testing or further treatment. HOME CARE INSTRUCTIONS  Monitor your abdominal pain for any changes. The following actions may help to alleviate any discomfort you are experiencing:  Only take over-the-counter or prescription medicines as directed by your health care provider.  Do not take laxatives unless directed to do so by your health care provider.  Try a clear liquid diet (broth, tea, or water) as directed by your health care provider. Slowly move to a bland diet as tolerated. SEEK MEDICAL CARE IF:  You have unexplained abdominal pain.  You have abdominal pain associated with nausea or diarrhea.  You have pain when you urinate or have a bowel movement.  You experience abdominal pain that wakes you in the night.  You have abdominal pain that is worsened or improved by eating food.  You have abdominal pain that is worsened with eating fatty foods.  You have a fever. SEEK IMMEDIATE MEDICAL CARE IF:   Your pain does not go away within 2 hours.  You keep throwing up (vomiting).  Your pain is felt only in portions of the abdomen, such as the right side or the left lower portion of the abdomen.  You pass bloody or black tarry stools. MAKE SURE YOU:  Understand these instructions.   Will watch your condition.    Will get help right away if you are not doing well or get worse.  Document Released: 04/22/2005 Document Revised: 07/18/2013 Document Reviewed: 03/22/2013 [facility] Patient Information 2015 [place], [place]. This information is not intended to replace advice given to you by your health care provider. Make sure you discuss any questions you have with your health care provider.  Constipation Constipation is when a person has fewer than three bowel movements a week, has difficulty having a bowel movement, or has stools that are dry, hard, or larger than normal. As people grow older, constipation is more common. If you try to fix constipation with medicines that make you have a bowel movement (laxatives), the problem may get worse. Long-term laxative use may cause the muscles of the colon to become weak. A low-fiber diet, not taking in enough fluids, and taking certain medicines may make constipation worse.  CAUSES   Certain medicines, such as antidepressants, pain medicine, iron supplements, antacids, and water pills.   Certain diseases, such as diabetes, irritable bowel syndrome (IBS), thyroid disease, or depression.   Not drinking enough water.   Not eating enough fiber-rich foods.   Stress or travel.   Lack of physical activity or exercise.   Ignoring the urge to have a bowel movement.   Using laxatives too much.  SIGNS AND SYMPTOMS   Having fewer than three bowel movements a week.   Straining to have a bowel movement.   Having stools that are hard, dry, or larger than  normal.   Feeling full or bloated.   Pain in the lower abdomen.   Not feeling relief after having a bowel movement.  DIAGNOSIS  Your health care provider will take a medical history and perform a physical exam. Further testing may be done for severe constipation. Some tests may include:  A barium enema X-ray to examine your rectum, colon, and, sometimes, your small intestine.   A  sigmoidoscopy to examine your lower colon.   A colonoscopy to examine your entire colon. TREATMENT  Treatment will depend on the severity of your constipation and what is causing it. Some dietary treatments include drinking more fluids and eating more fiber-rich foods. Lifestyle treatments may include regular exercise. If these diet and lifestyle recommendations do not help, your health care provider may recommend taking over-the-counter laxative medicines to help you have bowel movements. Prescription medicines may be prescribed if over-the-counter medicines do not work.  HOME CARE INSTRUCTIONS   Eat foods that have a lot of fiber, such as fruits, vegetables, whole grains, and beans.  Limit foods high in fat and processed sugars, such as french fries, hamburgers, cookies, candies, and soda.   A fiber supplement may be added to your diet if you cannot get enough fiber from foods.   Drink enough fluids to keep your urine clear or pale yellow.   Exercise regularly or as directed by your health care provider.   Go to the restroom when you have the urge to go. Do not hold it.   Only take over-the-counter or prescription medicines as directed by your health care provider. Do not take other medicines for constipation without talking to your health care provider first.  [place] IF:   You have bright red blood in your stool.   Your constipation lasts for more than 4 days or gets worse.   You have abdominal or rectal pain.   You have thin, pencil-like stools.   You have unexplained weight loss. MAKE SURE YOU:   Understand these instructions.  Will watch your condition.  Will get help right away if you are not doing well or get worse. Document Released: 04/10/2004 Document Revised: 07/18/2013 Document Reviewed: 04/24/2013 [facility] Patient Information 2015 [place], [place]. This information is not intended to replace advice given to you by your health care  provider. Make sure you discuss any questions you have with your health care provider.

## 2014-12-06 NOTE — ED Notes (Signed)
Pt states flare up of pancreatitis x 2 weeks. Seen by PCP on Tuesday and had elevated enzymes. States pain is worse.

## 2014-12-06 NOTE — ED Notes (Signed)
MD at bedside. 

## 2014-12-06 NOTE — ED Notes (Signed)
Pt left department prior to receiving discharge instructions.

## 2014-12-06 NOTE — ED Notes (Signed)
Pt notified of need for urine sample. 

## 2014-12-06 NOTE — ED Provider Notes (Signed)
CSN: [id number]     Arrival date & time 12/06/14  1414 History   First MD Initiated Contact with Patient 12/06/14 1712     Chief Complaint  Patient presents with  . Pancreatitis     (Consider location/radiation/quality/duration/timing/severity/associated sxs/prior Treatment) HPI Comments: Patient is a 44 year old female with history of chronic abdominal pain related to chronic pancreatitis. She presents with a two-week history of worsening abdominal pain. She was seen by her primary doctor and told her white count was elevated. She denies to me she is having any fevers or chills. She denies any bloody stool. She does feel constipated and occasionally vomits. She tells me that she has had endoscopic injections through her stomach at [facility] for pain control.  Patient is a 44 y.o. female presenting with abdominal pain. The history is provided by the patient.  Abdominal Pain Pain location:  Epigastric Pain quality: cramping   Pain radiates to:  Back Pain severity:  Severe Onset quality:  Gradual Duration:  2 weeks Timing:  Constant Progression:  Worsening Chronicity:  New Context: not alcohol use     Past Medical History  Diagnosis Date  . Asthma   . Depression   . Migraine   . Pancreatitis chronic Idiopathic    Treated by Dr. Newman Pies at [facility] in the past with celiac blocks.  . Bipolar 1 disorder   . Hyperlipidemia 02/03/2012  . Diabetes mellitus   . COPD (chronic obstructive pulmonary disease)   . Neck pain 06/08/2012  . Chronic abdominal pain   . Anemia   . Anxiety   . Hiatal hernia   . Esophagitis    Past Surgical History  Procedure Laterality Date  . Cholecystectomy    . Tonsillectomy    . Abdominal hysterectomy    . Esophagogastroduodenoscopy  02/20/2008    HBZ:JIRCVELFY distal esophageal mucosa, suspicious for neoplasm/Hiatal hernia otherwise normal   . Esophagogastroduodenoscopy  04/03/2008    BOF:BPZWCHEN-IDPOE hiatal hernia, otherwise normal/Short,  tight, benign-appearing peptic stricture  . Ileocolonoscopy  06/05/2008      [facility]:NTIRWER anal canal, otherwise normal rectum, colon  . Esophagogastroduodenoscopy  November 16, 2012    Dr. Britta Mccreedy: hiatal hernia, esophagitis,   . Givens capsule study N/A 12/05/2012    Few superficial erosions but nothing found to explain iron deficiency anemia.   . Colonoscopy N/A 12/05/2012    XVQ:MGQQPY rectum, colon and terminal ileum  . Celiac plexus block     Family History  Problem Relation Age of Onset  . Asthma Other   . Coronary artery disease Neg Hx   . Colon cancer Neg Hx   . Asthma Paternal Grandfather    History  Substance Use Topics  . Smoking status: Former Smoker -- 1.00 packs/day for 15 years    Types: Cigarettes  . Smokeless tobacco: Former Systems developer  . Alcohol Use: No   OB History    No data available     Review of Systems  Gastrointestinal: Positive for abdominal pain.  All other systems reviewed and are negative.     Allergies  Penicillins; Sulfa antibiotics; and Prednisone  Home Medications   Prior to Admission medications   Medication Sig Start Date End Date Taking? Authorizing Provider  albuterol (PROVENTIL HFA;VENTOLIN HFA) 108 (90 BASE) MCG/ACT inhaler Inhale 2 puffs into the lungs daily as needed. For shortness of breath    Historical Provider, MD  amitriptyline (ELAVIL) 75 MG tablet Take 75 mg by mouth at bedtime.    Historical Provider, MD  amLODipine (NORVASC) 10 MG tablet Take 10 mg by mouth at bedtime.     Historical Provider, MD  budesonide-formoterol (SYMBICORT) 80-4.5 MCG/ACT inhaler Inhale 2 puffs into the lungs 2 (two) times daily. 03/30/14   Samuella Cota, MD  conjugated estrogens (PREMARIN) vaginal cream Apply figertip amount to swollen area daily 04/07/14   Orpah Greek, MD  docusate sodium (COLACE) 100 MG capsule Take 100 mg by mouth at bedtime.     Historical Provider, MD  ferrous sulfate 325 (65 FE) MG tablet Take 325 mg by mouth daily.     Historical Provider, MD  hydrOXYzine (VISTARIL) 25 MG capsule Take 25 mg by mouth 4 (four) times daily.    Historical Provider, MD  ipratropium-albuterol (DUONEB) 0.5-2.5 (3) MG/3ML SOLN Inhale 3 mLs into the lungs every 6 (six) hours as needed. Take 3 mLs by nebulization every 6 (six) hours as needed for Wheezing. 07/01/13   Shanker Kristeen Mans, MD  LORazepam (ATIVAN) 0.5 MG tablet Take 0.5 mg by mouth every 8 (eight) hours as needed for anxiety.     Historical Provider, MD  metFORMIN (GLUCOPHAGE) 500 MG tablet Take 500 mg by mouth 2 (two) times daily with a meal.  07/01/13   Shanker Kristeen Mans, MD  nicotine (NICODERM CQ - DOSED IN MG/24 HOURS) 14 mg/24hr patch Place 1 patch (14 mg total) onto the skin daily. 02/20/14   Radene Gunning, NP  oxyCODONE (ROXICODONE) 15 MG immediate release tablet Take 15 mg by mouth every 4 (four) hours as needed for pain.    Historical Provider, MD  Pancrelipase, Lip-Prot-Amyl, (ZENPEP) 20000 UNITS CPEP Take 2-3 capsules by mouth 5 (five) times daily. 3 capsules three times daily with each meal and 2 capsules with each snack.    Historical Provider, MD  pantoprazole (PROTONIX) 40 MG tablet Take 40 mg by mouth 2 (two) times daily.    Historical Provider, MD  polyethylene glycol powder (GLYCOLAX/MIRALAX) powder Take 17 g by mouth daily as needed. for Constipation.    Historical Provider, MD  promethazine (PHENERGAN) 25 MG tablet Take 25 mg by mouth every 6 (six) hours as needed for nausea or vomiting.    Historical Provider, MD   BP 102/68 mmHg  Pulse 105  Temp(Src) 98 F (36.7 C) (Oral)  Resp 14  Ht 5\' 2"  (1.575 m)  Wt 128 lb (58.06 kg)  BMI 23.41 kg/m2  SpO2 100% Physical Exam  Constitutional: She is oriented to person, place, and time. She appears well-developed and well-nourished. No distress.  HENT:  Head: Normocephalic and atraumatic.  Neck: Normal range of motion. Neck supple.  Cardiovascular: Normal rate and regular rhythm.  Exam reveals no gallop and no  friction rub.   No murmur heard. Pulmonary/Chest: Effort normal and breath sounds normal. No respiratory distress. She has no wheezes.  Abdominal: Soft. Bowel sounds are normal. She exhibits no distension. There is tenderness. There is no rebound and no guarding.  There is tenderness to palpation in the epigastric region.  Musculoskeletal: Normal range of motion.  Neurological: She is alert and oriented to person, place, and time.  Skin: Skin is warm and dry. She is not diaphoretic.  Nursing note and vitals reviewed.   ED Course  Procedures (including critical care time) Labs Review Labs Reviewed  CBC - Abnormal; Notable for the following:    WBC 18.8 (*)    All other components within normal limits  COMPREHENSIVE METABOLIC PANEL - Abnormal; Notable for the following:  Sodium 134 (*)    Chloride 98 (*)    Glucose, Bld 163 (*)    Total Protein 6.1 (*)    Albumin 3.4 (*)    All other components within normal limits  LIPASE, BLOOD  URINALYSIS, ROUTINE W REFLEX MICROSCOPIC    Imaging Review No results found.   EKG Interpretation None      MDM   Final diagnoses:  Epigastric pain    Patient is a 44 year old female with history of chronic pancreatitis. She presents with epigastric pain that from my workup appears to be related more to constipation than pancreatitis. There is no laboratory or radiographic evidence of pancreatitis. She is feeling somewhat better with pain medication in the ER. Her CT scan does show an increased stool burden for which I will recommend magnesium citrate. She will be discharged to home with instructions to follow-up with her primary Dr. if not improving. She does have an elevated white count, however I have found no acute intra-abdominal pathology to explain this. I suspect it may be related more to stress and pain than infection.    Veryl Speak, MD 12/06/14 1950

## 2014-12-11 DIAGNOSIS — K861 Other chronic pancreatitis: Secondary | ICD-10-CM | POA: Diagnosis not present

## 2014-12-11 DIAGNOSIS — K859 Acute pancreatitis, unspecified: Secondary | ICD-10-CM | POA: Diagnosis not present

## 2014-12-11 DIAGNOSIS — Z6822 Body mass index (BMI) 22.0-22.9, adult: Secondary | ICD-10-CM | POA: Diagnosis not present

## 2014-12-12 DIAGNOSIS — R310 Gross hematuria: Secondary | ICD-10-CM | POA: Diagnosis not present

## 2014-12-12 DIAGNOSIS — R079 Chest pain, unspecified: Secondary | ICD-10-CM | POA: Diagnosis not present

## 2014-12-12 DIAGNOSIS — J440 Chronic obstructive pulmonary disease with acute lower respiratory infection: Secondary | ICD-10-CM | POA: Diagnosis not present

## 2014-12-12 DIAGNOSIS — R109 Unspecified abdominal pain: Secondary | ICD-10-CM | POA: Diagnosis not present

## 2014-12-12 DIAGNOSIS — R0602 Shortness of breath: Secondary | ICD-10-CM | POA: Diagnosis not present

## 2014-12-12 DIAGNOSIS — R0902 Hypoxemia: Secondary | ICD-10-CM | POA: Diagnosis not present

## 2014-12-12 DIAGNOSIS — R1011 Right upper quadrant pain: Secondary | ICD-10-CM | POA: Diagnosis not present

## 2014-12-12 DIAGNOSIS — K861 Other chronic pancreatitis: Secondary | ICD-10-CM | POA: Diagnosis not present

## 2014-12-12 DIAGNOSIS — J189 Pneumonia, unspecified organism: Secondary | ICD-10-CM | POA: Diagnosis not present

## 2014-12-13 DIAGNOSIS — J189 Pneumonia, unspecified organism: Secondary | ICD-10-CM | POA: Diagnosis not present

## 2014-12-13 DIAGNOSIS — J9690 Respiratory failure, unspecified, unspecified whether with hypoxia or hypercapnia: Secondary | ICD-10-CM | POA: Diagnosis not present

## 2014-12-14 DIAGNOSIS — J189 Pneumonia, unspecified organism: Secondary | ICD-10-CM | POA: Diagnosis not present

## 2014-12-14 DIAGNOSIS — J9690 Respiratory failure, unspecified, unspecified whether with hypoxia or hypercapnia: Secondary | ICD-10-CM | POA: Diagnosis not present

## 2014-12-14 DIAGNOSIS — K449 Diaphragmatic hernia without obstruction or gangrene: Secondary | ICD-10-CM | POA: Diagnosis not present

## 2014-12-19 DIAGNOSIS — E119 Type 2 diabetes mellitus without complications: Secondary | ICD-10-CM | POA: Diagnosis not present

## 2014-12-19 DIAGNOSIS — I10 Essential (primary) hypertension: Secondary | ICD-10-CM | POA: Diagnosis not present

## 2014-12-19 DIAGNOSIS — G894 Chronic pain syndrome: Secondary | ICD-10-CM | POA: Diagnosis not present

## 2014-12-19 DIAGNOSIS — Z6823 Body mass index (BMI) 23.0-23.9, adult: Secondary | ICD-10-CM | POA: Diagnosis not present

## 2015-01-04 DIAGNOSIS — D508 Other iron deficiency anemias: Secondary | ICD-10-CM | POA: Diagnosis not present

## 2015-01-04 DIAGNOSIS — D72829 Elevated white blood cell count, unspecified: Secondary | ICD-10-CM | POA: Diagnosis not present

## 2015-01-21 ENCOUNTER — Other Ambulatory Visit: Payer: Self-pay

## 2015-01-23 DIAGNOSIS — E611 Iron deficiency: Secondary | ICD-10-CM | POA: Insufficient documentation

## 2015-01-23 DIAGNOSIS — D721 Eosinophilia, unspecified: Secondary | ICD-10-CM | POA: Insufficient documentation

## 2015-01-23 DIAGNOSIS — J82 Pulmonary eosinophilia, not elsewhere classified: Secondary | ICD-10-CM | POA: Diagnosis not present

## 2015-01-23 DIAGNOSIS — D72829 Elevated white blood cell count, unspecified: Secondary | ICD-10-CM | POA: Diagnosis not present

## 2015-01-23 DIAGNOSIS — R69 Illness, unspecified: Secondary | ICD-10-CM | POA: Diagnosis not present

## 2015-01-25 DIAGNOSIS — E119 Type 2 diabetes mellitus without complications: Secondary | ICD-10-CM | POA: Diagnosis not present

## 2015-01-25 DIAGNOSIS — J449 Chronic obstructive pulmonary disease, unspecified: Secondary | ICD-10-CM | POA: Diagnosis not present

## 2015-01-25 DIAGNOSIS — Z87891 Personal history of nicotine dependence: Secondary | ICD-10-CM | POA: Diagnosis not present

## 2015-01-25 DIAGNOSIS — E079 Disorder of thyroid, unspecified: Secondary | ICD-10-CM | POA: Diagnosis not present

## 2015-01-25 DIAGNOSIS — K589 Irritable bowel syndrome without diarrhea: Secondary | ICD-10-CM | POA: Diagnosis not present

## 2015-01-25 DIAGNOSIS — J45909 Unspecified asthma, uncomplicated: Secondary | ICD-10-CM | POA: Diagnosis not present

## 2015-01-25 DIAGNOSIS — K219 Gastro-esophageal reflux disease without esophagitis: Secondary | ICD-10-CM | POA: Diagnosis not present

## 2015-01-25 DIAGNOSIS — F419 Anxiety disorder, unspecified: Secondary | ICD-10-CM | POA: Diagnosis not present

## 2015-01-25 DIAGNOSIS — K861 Other chronic pancreatitis: Secondary | ICD-10-CM | POA: Diagnosis not present

## 2015-01-25 DIAGNOSIS — I10 Essential (primary) hypertension: Secondary | ICD-10-CM | POA: Diagnosis not present

## 2015-02-15 DIAGNOSIS — H2513 Age-related nuclear cataract, bilateral: Secondary | ICD-10-CM | POA: Diagnosis not present

## 2015-02-15 DIAGNOSIS — E1165 Type 2 diabetes mellitus with hyperglycemia: Secondary | ICD-10-CM | POA: Diagnosis not present

## 2015-02-15 DIAGNOSIS — H524 Presbyopia: Secondary | ICD-10-CM | POA: Diagnosis not present

## 2015-02-15 DIAGNOSIS — E119 Type 2 diabetes mellitus without complications: Secondary | ICD-10-CM | POA: Diagnosis not present

## 2015-02-19 DIAGNOSIS — F419 Anxiety disorder, unspecified: Secondary | ICD-10-CM | POA: Diagnosis not present

## 2015-02-19 DIAGNOSIS — Z681 Body mass index (BMI) 19 or less, adult: Secondary | ICD-10-CM | POA: Diagnosis not present

## 2015-02-19 DIAGNOSIS — I10 Essential (primary) hypertension: Secondary | ICD-10-CM | POA: Diagnosis not present

## 2015-02-19 DIAGNOSIS — E063 Autoimmune thyroiditis: Secondary | ICD-10-CM | POA: Diagnosis not present

## 2015-02-19 DIAGNOSIS — M25579 Pain in unspecified ankle and joints of unspecified foot: Secondary | ICD-10-CM | POA: Diagnosis not present

## 2015-02-19 DIAGNOSIS — Z1389 Encounter for screening for other disorder: Secondary | ICD-10-CM | POA: Diagnosis not present

## 2015-03-05 ENCOUNTER — Encounter (HOSPITAL_COMMUNITY): Payer: Self-pay | Admitting: Emergency Medicine

## 2015-03-05 ENCOUNTER — Emergency Department (HOSPITAL_COMMUNITY): Payer: Medicare Other

## 2015-03-05 ENCOUNTER — Emergency Department (HOSPITAL_COMMUNITY)
Admission: EM | Admit: 2015-03-05 | Discharge: 2015-03-05 | Disposition: A | Discharge status: Home / Self Care | Payer: Medicare Other | Attending: Emergency Medicine | Admitting: Emergency Medicine

## 2015-03-05 DIAGNOSIS — R110 Nausea: Secondary | ICD-10-CM | POA: Insufficient documentation

## 2015-03-05 DIAGNOSIS — R0602 Shortness of breath: Secondary | ICD-10-CM | POA: Diagnosis not present

## 2015-03-05 DIAGNOSIS — K861 Other chronic pancreatitis: Secondary | ICD-10-CM | POA: Insufficient documentation

## 2015-03-05 DIAGNOSIS — J441 Chronic obstructive pulmonary disease with (acute) exacerbation: Secondary | ICD-10-CM | POA: Insufficient documentation

## 2015-03-05 DIAGNOSIS — E119 Type 2 diabetes mellitus without complications: Secondary | ICD-10-CM | POA: Insufficient documentation

## 2015-03-05 DIAGNOSIS — D649 Anemia, unspecified: Secondary | ICD-10-CM | POA: Insufficient documentation

## 2015-03-05 DIAGNOSIS — G8929 Other chronic pain: Secondary | ICD-10-CM | POA: Diagnosis not present

## 2015-03-05 DIAGNOSIS — Z720 Tobacco use: Secondary | ICD-10-CM | POA: Insufficient documentation

## 2015-03-05 DIAGNOSIS — Z7951 Long term (current) use of inhaled steroids: Secondary | ICD-10-CM | POA: Diagnosis not present

## 2015-03-05 DIAGNOSIS — R1011 Right upper quadrant pain: Secondary | ICD-10-CM | POA: Diagnosis present

## 2015-03-05 DIAGNOSIS — M545 Low back pain: Secondary | ICD-10-CM | POA: Diagnosis not present

## 2015-03-05 DIAGNOSIS — R000 Tachycardia, unspecified: Secondary | ICD-10-CM | POA: Insufficient documentation

## 2015-03-05 DIAGNOSIS — Z880 Allergy status to penicillin: Secondary | ICD-10-CM | POA: Diagnosis not present

## 2015-03-05 DIAGNOSIS — R079 Chest pain, unspecified: Secondary | ICD-10-CM | POA: Insufficient documentation

## 2015-03-05 DIAGNOSIS — G43909 Migraine, unspecified, not intractable, without status migrainosus: Secondary | ICD-10-CM | POA: Insufficient documentation

## 2015-03-05 DIAGNOSIS — R062 Wheezing: Secondary | ICD-10-CM | POA: Diagnosis not present

## 2015-03-05 DIAGNOSIS — F419 Anxiety disorder, unspecified: Secondary | ICD-10-CM | POA: Insufficient documentation

## 2015-03-05 DIAGNOSIS — K760 Fatty (change of) liver, not elsewhere classified: Secondary | ICD-10-CM | POA: Diagnosis not present

## 2015-03-05 DIAGNOSIS — Z79899 Other long term (current) drug therapy: Secondary | ICD-10-CM | POA: Insufficient documentation

## 2015-03-05 DIAGNOSIS — N1330 Unspecified hydronephrosis: Secondary | ICD-10-CM | POA: Diagnosis not present

## 2015-03-05 DIAGNOSIS — K5900 Constipation, unspecified: Secondary | ICD-10-CM | POA: Insufficient documentation

## 2015-03-05 LAB — CBC
HCT: 39.9 % (ref 36.0–46.0)
Hemoglobin: 13.3 g/dL (ref 12.0–15.0)
MCH: 29.8 pg (ref 26.0–34.0)
MCHC: 33.3 g/dL (ref 30.0–36.0)
MCV: 89.3 fL (ref 78.0–100.0)
Platelets: 225 K/uL (ref 150–400)
RBC: 4.47 MIL/uL (ref 3.87–5.11)
RDW: 13.1 % (ref 11.5–15.5)
WBC: 9.9 K/uL (ref 4.0–10.5)

## 2015-03-05 LAB — COMPREHENSIVE METABOLIC PANEL WITH GFR
ALT: 17 U/L (ref 14–54)
AST: 20 U/L (ref 15–41)
Albumin: 4 g/dL (ref 3.5–5.0)
Alkaline Phosphatase: 55 U/L (ref 38–126)
Anion gap: 13 (ref 5–15)
BUN: 11 mg/dL (ref 6–20)
CO2: 27 mmol/L (ref 22–32)
Calcium: 9.6 mg/dL (ref 8.9–10.3)
Chloride: 99 mmol/L — ABNORMAL LOW (ref 101–111)
Creatinine, Ser: 0.68 mg/dL (ref 0.44–1.00)
GFR calc Af Amer: >60 mL/min
GFR calc non Af Amer: >60 mL/min
Glucose, Bld: 159 mg/dL — ABNORMAL HIGH (ref 65–99)
Potassium: 4.2 mmol/L (ref 3.5–5.1)
Sodium: 139 mmol/L (ref 135–145)
Total Bilirubin: 0.4 mg/dL (ref 0.3–1.2)
Total Protein: 6.5 g/dL (ref 6.5–8.1)

## 2015-03-05 LAB — LIPASE, BLOOD: Lipase: <10 U/L — ABNORMAL LOW (ref 22–51)

## 2015-03-05 LAB — D-DIMER, QUANTITATIVE (NOT AT ARMC): D DIMER QUANT: 0.3 ug{FEU}/mL (ref 0.00–0.48)

## 2015-03-05 LAB — TROPONIN I: Troponin I: <0.03 ng/mL

## 2015-03-05 MED ORDER — OXYCODONE-ACETAMINOPHEN 5-325 MG PO TABS
1.0000 | ORAL_TABLET | ORAL | Status: DC | PRN
  Administered 2015-03-05: 1 via ORAL
  Filled 2015-03-05: qty 1

## 2015-03-05 MED ORDER — POLYETHYLENE GLYCOL 3350 17 GM/SCOOP PO POWD: 17.0000 g | Freq: Two times a day (BID) | ORAL | Status: DC

## 2015-03-05 MED ORDER — IOHEXOL 300 MG/ML  SOLN
50.0000 mL | Freq: Once | INTRAMUSCULAR | Status: AC | PRN
  Administered 2015-03-05: 50 mL via ORAL

## 2015-03-05 MED ORDER — MAGNESIUM CITRATE PO SOLN: 296.0000 mL | Freq: Once | ORAL | Status: DC

## 2015-03-05 MED ORDER — HYDROMORPHONE HCL 1 MG/ML IJ SOLN
1.0000 mg | Freq: Once | INTRAMUSCULAR | Status: AC
  Administered 2015-03-05: 1 mg via INTRAVENOUS
  Filled 2015-03-05: qty 1

## 2015-03-05 MED ORDER — IOHEXOL 300 MG/ML  SOLN
100.0000 mL | Freq: Once | INTRAMUSCULAR | Status: AC | PRN
  Administered 2015-03-05: 100 mL via INTRAVENOUS

## 2015-03-05 NOTE — Patient Outreach (Signed)
[facility]) Care Management  03/05/2015  [name] 09/09/1970 [id number]   Referral from [place] [facility], assigned Sherrin Daisy, RN to outreach.  Thanks, Ronnell Freshwater. [place], [place] Assistant Phone: [phone] Fax: [phone]

## 2015-03-05 NOTE — ED Notes (Addendum)
Patient requested to be put on oxygen. Pt. States she wears 3L O2 via [place] at home. Placed Pt. On 3L O2 via [place] per request. Pt. O2 sat went from 95% to 100%.

## 2015-03-05 NOTE — ED Notes (Signed)
Pt states that she has been having pain in her right breast area moving into back and abdomen with nausea for the past few days.  Also c/o sob.  States that she took albuterol and used her prn oxygen without relief.

## 2015-03-05 NOTE — ED Notes (Signed)
Patient states that her pain has returned. Stated that it didn't start again until she drank contrast. RN Ron made aware.

## 2015-03-05 NOTE — ED Provider Notes (Signed)
CSN: [id number]     Arrival date & time 03/05/15  1638 History   First MD Initiated Contact with Patient 03/05/15 1641     Chief Complaint  Patient presents with  . Chest Pain  . Shortness of Breath     (Consider location/radiation/quality/duration/timing/severity/associated sxs/prior Treatment) HPI Comments: Pt is a 44 yo female who presents to the ED complaining of right chest pain and SOB, onset Saturday. Pt states that the pain started in her right chest and now extends across to her right upper back and RUQ. She notes that the pain is sharp, constant and worsening. Endorses nausea. Denies fever, cough, vomiting, diarrhea, dysuria. She states that she has a history of pancreatitis and notes that the last time she was sick was 3 months ago at [facility] when they placed a Celiac block. Pt also reports that she started having difficulty breathing 2 days ago. She states that she used an albuterol nebulizer treatment at home today without relief but notes when she used her home oxygen her breathing slightly improved. She also reports that appx. 1 month ago she was hospitalized at [facility] and was in the ICU for a week due to pneumonia.   Patient is a 44 y.o. female presenting with chest pain and shortness of breath.  Chest Pain Associated symptoms: abdominal pain, back pain, nausea and shortness of breath   Associated symptoms: no cough, no fever, no headache, no numbness, no palpitations, not vomiting and no weakness   Shortness of Breath Associated symptoms: abdominal pain, chest pain and wheezing   Associated symptoms: no cough, no fever, no headaches and no vomiting     Past Medical History  Diagnosis Date  . Asthma   . Depression   . Migraine   . Pancreatitis chronic Idiopathic    Treated by Dr. Newman Pies at [facility] in the past with celiac blocks.  . Bipolar 1 disorder   . Hyperlipidemia 02/03/2012  . Diabetes mellitus   . COPD (chronic obstructive pulmonary disease)   . Neck  pain 06/08/2012  . Chronic abdominal pain   . Anemia   . Anxiety   . Hiatal hernia   . Esophagitis    Past Surgical History  Procedure Laterality Date  . Cholecystectomy    . Tonsillectomy    . Abdominal hysterectomy    . Esophagogastroduodenoscopy  02/20/2008    [id number] distal esophageal mucosa, suspicious for neoplasm/Hiatal hernia otherwise normal   . Esophagogastroduodenoscopy  04/03/2008    [id number] hiatal hernia, otherwise normal/Short, tight, benign-appearing peptic stricture  . Ileocolonoscopy  06/05/2008      [id number] anal canal, otherwise normal rectum, colon  . Esophagogastroduodenoscopy  November 16, 2012    Dr. Britta Mccreedy: hiatal hernia, esophagitis,   . Givens capsule study N/A 12/05/2012    Few superficial erosions but nothing found to explain iron deficiency anemia.   . Colonoscopy N/A 12/05/2012    [id number] rectum, colon and terminal ileum  . Celiac plexus block     Family History  Problem Relation Age of Onset  . Asthma Other   . Coronary artery disease Neg Hx   . Colon cancer Neg Hx   . Asthma Paternal Grandfather    History  Substance Use Topics  . Smoking status: Current Every Day Smoker -- 1.00 packs/day for 0 years    Types: Cigarettes  . Smokeless tobacco: Former Systems developer  . Alcohol Use: No   OB History    No data available     Review of  Systems  Constitutional: Negative for fever.  Respiratory: Positive for shortness of breath and wheezing. Negative for cough.   Cardiovascular: Positive for chest pain. Negative for palpitations and leg swelling.  Gastrointestinal: Positive for nausea and abdominal pain. Negative for vomiting and diarrhea.  Genitourinary: Negative for dysuria.  Musculoskeletal: Positive for back pain.  Neurological: Negative for weakness, numbness and headaches.      Allergies  Penicillins; Sulfa antibiotics; and Prednisone  Home Medications   Prior to Admission medications   Medication Sig Start Date End  Date Taking? Authorizing Provider  albuterol (PROVENTIL HFA;VENTOLIN HFA) 108 (90 BASE) MCG/ACT inhaler Inhale 2 puffs into the lungs daily as needed. For shortness of breath    Historical Provider, MD  amitriptyline (ELAVIL) 75 MG tablet Take 75 mg by mouth at bedtime.    Historical Provider, MD  amLODipine (NORVASC) 10 MG tablet Take 10 mg by mouth at bedtime.     Historical Provider, MD  budesonide-formoterol (SYMBICORT) 80-4.5 MCG/ACT inhaler Inhale 2 puffs into the lungs 2 (two) times daily. 03/30/14   Samuella Cota, MD  conjugated estrogens (PREMARIN) vaginal cream Apply figertip amount to swollen area daily Patient not taking: Reported on 12/06/2014 04/07/14   Orpah Greek, MD  docusate sodium (COLACE) 100 MG capsule Take 100 mg by mouth at bedtime.     Historical Provider, MD  ferrous sulfate 325 (65 FE) MG tablet Take 325 mg by mouth daily.    Historical Provider, MD  hydrOXYzine (VISTARIL) 25 MG capsule Take 25 mg by mouth 4 (four) times daily.    Historical Provider, MD  ipratropium-albuterol (DUONEB) 0.5-2.5 (3) MG/3ML SOLN Inhale 3 mLs into the lungs every 6 (six) hours as needed. Take 3 mLs by nebulization every 6 (six) hours as needed for Wheezing. 07/01/13   Shanker Kristeen Mans, MD  LORazepam (ATIVAN) 0.5 MG tablet Take 0.5 mg by mouth every 8 (eight) hours as needed for anxiety.     Historical Provider, MD  metFORMIN (GLUCOPHAGE) 500 MG tablet Take 500 mg by mouth 2 (two) times daily with a meal.  07/01/13   Shanker Kristeen Mans, MD  nicotine (NICODERM CQ - DOSED IN MG/24 HOURS) 14 mg/24hr patch Place 1 patch (14 mg total) onto the skin daily. 02/20/14   Radene Gunning, NP  oxyCODONE (ROXICODONE) 15 MG immediate release tablet Take 15 mg by mouth every 4 (four) hours as needed for pain.    Historical Provider, MD  Pancrelipase, Lip-Prot-Amyl, (ZENPEP) 20000 UNITS CPEP Take 2-3 capsules by mouth 5 (five) times daily. 3 capsules three times daily with each meal and 2 capsules with each  snack.    Historical Provider, MD  pantoprazole (PROTONIX) 40 MG tablet Take 40 mg by mouth 2 (two) times daily.    Historical Provider, MD  polyethylene glycol powder (GLYCOLAX/MIRALAX) powder Take 17 g by mouth daily as needed. for Constipation.    Historical Provider, MD  promethazine (PHENERGAN) 25 MG tablet Take 25 mg by mouth every 6 (six) hours as needed for nausea or vomiting.    Historical Provider, MD   BP 144/89 mmHg  Pulse 114  Temp(Src) 98.1 F (36.7 C) (Oral)  Resp 18  Ht 5\' 2"  (1.575 m)  Wt 118 lb (53.524 kg)  BMI 21.58 kg/m2  SpO2 95% Physical Exam  Constitutional: She is oriented to person, place, and time. She appears well-developed and well-nourished.  HENT:  Head: Normocephalic and atraumatic.  Eyes: Conjunctivae and EOM are normal. Right eye exhibits  no discharge. Left eye exhibits no discharge. No scleral icterus.  Neck: Normal range of motion.  Cardiovascular: Regular rhythm, normal heart sounds and intact distal pulses.   Tachycardic   Pulmonary/Chest: Effort normal. She has wheezes.  Inspiratory and expiratory wheezes heard throughout all lung fields. TTP along right lower chest wall  Abdominal: Soft. Bowel sounds are normal. She exhibits no mass. There is tenderness. There is no rebound and no guarding.  Moderate tenderness to palpation in RUQ  Musculoskeletal: She exhibits no edema.  Lymphadenopathy:    She has no cervical adenopathy.  Neurological: She is alert and oriented to person, place, and time.  Skin: Skin is warm and dry.    ED Course  Procedures (including critical care time) Labs Review Labs Reviewed - No data to display  Imaging Review No results found.   EKG Interpretation   Date/Time:  Tuesday March 05 2015 16:46:03 EDT Ventricular Rate:  113 PR Interval:  115 QRS Duration: 109 QT Interval:  335 QTC Calculation: 459 R Axis:   165 Text Interpretation:  Sinus tachycardia Probable right ventricular  hypertrophy since last  tracing no significant change Confirmed by MILLER   MD, BRIAN ([id number]) on 03/05/2015 4:52:23 PM      MDM   Final diagnoses:  Constipation, unspecified constipation type    Pt presents with SOB and chest/abdominal pain. D-dimer ordered to rule out PE. Chest xray, CBC, and CMP ordered.  CT abdomen/pelvis ordered.  CT and labs reviewed. Will plan to d/c pt home with miralax and mag citrate. Discussed plan with pt and pt agrees.   Meds given in ED:  Medications  oxyCODONE-acetaminophen (PERCOCET/ROXICET) 5-325 MG per tablet 1 tablet (1 tablet Oral Given 03/05/15 1745)  HYDROmorphone (DILAUDID) injection 1 mg (1 mg Intravenous Given 03/05/15 1851)  iohexol (OMNIPAQUE) 300 MG/ML solution 50 mL (50 mLs Oral Contrast Given 03/05/15 1936)  iohexol (OMNIPAQUE) 300 MG/ML solution 100 mL (100 mLs Intravenous Contrast Given 03/05/15 2126)    New Prescriptions   No medications on file     Nona Dell, [place] 03/05/15 2237  Noemi Chapel, MD 03/07/15 [phone]

## 2015-03-05 NOTE — ED Notes (Signed)
Pt alert & oriented x4, stable gait. Patient given discharge instructions, paperwork & prescription(s). Patient  instructed to stop at the registration desk to finish any additional paperwork. Patient verbalized understanding. Pt left department w/ no further questions. 

## 2015-03-12 DIAGNOSIS — K861 Other chronic pancreatitis: Secondary | ICD-10-CM | POA: Diagnosis not present

## 2015-03-12 DIAGNOSIS — G894 Chronic pain syndrome: Secondary | ICD-10-CM | POA: Diagnosis not present

## 2015-03-12 DIAGNOSIS — I10 Essential (primary) hypertension: Secondary | ICD-10-CM | POA: Diagnosis not present

## 2015-03-12 DIAGNOSIS — Z6822 Body mass index (BMI) 22.0-22.9, adult: Secondary | ICD-10-CM | POA: Diagnosis not present

## 2015-03-26 ENCOUNTER — Other Ambulatory Visit: Payer: Self-pay | Admitting: *Deleted

## 2015-03-26 NOTE — Patient Outreach (Signed)
[facility]) Care Management  03/26/2015  ZITA OZIMEK Dec 08, 1970 [id number]  High Risk Referral:  Telephone call to patient; recording states number has been disconnected or changed. 2nd phone number called-was advised wrong number.  Plan: will contact MD office regarding contact number. Sherrin Daisy, RN BSN [place] Management Coordinator [facility] Care Management  [phone]

## 2015-03-27 ENCOUNTER — Encounter: Payer: Self-pay | Admitting: *Deleted

## 2015-03-27 ENCOUNTER — Other Ambulatory Visit: Payer: Medicare Other | Admitting: *Deleted

## 2015-03-27 NOTE — Patient Outreach (Signed)
[name]  03/27/2015  [name] 26-Dec-1970 [id number]   Telephone call to MD office to verify contact number. Telephone call to home number -not a valid number. Telephone call to 1st cell phone number,  message left. Telephone call to 2nd cell number with response from patient.  Patient was advised of reason for call and of [name] care [name] services. Patient states she has a good working knowledge of how to manage her numerous health conditions. States she is seeing primary care provider every 3 months. States no problems with getting to appointments or obtaining her medications. States she is aware of why she is taking all of her medication and voices understanding of taking them as prescribed by her doctor. Medication adherence re-enforcement done.   States taking preventive measure by getting flu & pneumonia vaccines as advised. Knows triggers and action plan for respiratory problem.   Patient has declined [facility] care [name] services. Contact information for [facility] care [name] given to patient.   Plan: will close case & send MD closure letter.  Chelsea Daisy, RN BSN [place] [name] Coordinator [facility] Care [name]  [phone]

## 2015-04-02 NOTE — Patient Outreach (Signed)
[facility]) Care Management  03/27/2015  [name] [birth year] [id number]   Notification from Sherrin Daisy, RN to close case due to patient refused [place] Management services.  Thanks, Ronnell Freshwater. [place], [place] Assistant Phone: [phone] Fax: [phone]

## 2015-04-08 DIAGNOSIS — Z1389 Encounter for screening for other disorder: Secondary | ICD-10-CM | POA: Diagnosis not present

## 2015-04-08 DIAGNOSIS — N390 Urinary tract infection, site not specified: Secondary | ICD-10-CM | POA: Diagnosis not present

## 2015-04-08 DIAGNOSIS — E119 Type 2 diabetes mellitus without complications: Secondary | ICD-10-CM | POA: Diagnosis not present

## 2015-04-08 DIAGNOSIS — E063 Autoimmune thyroiditis: Secondary | ICD-10-CM | POA: Diagnosis not present

## 2015-04-08 DIAGNOSIS — G894 Chronic pain syndrome: Secondary | ICD-10-CM | POA: Diagnosis not present

## 2015-04-08 DIAGNOSIS — N3281 Overactive bladder: Secondary | ICD-10-CM | POA: Diagnosis not present

## 2015-04-17 DIAGNOSIS — N390 Urinary tract infection, site not specified: Secondary | ICD-10-CM | POA: Diagnosis not present

## 2015-04-25 DIAGNOSIS — J441 Chronic obstructive pulmonary disease with (acute) exacerbation: Secondary | ICD-10-CM | POA: Diagnosis not present

## 2015-04-25 DIAGNOSIS — F17200 Nicotine dependence, unspecified, uncomplicated: Secondary | ICD-10-CM | POA: Diagnosis not present

## 2015-04-25 DIAGNOSIS — R0602 Shortness of breath: Secondary | ICD-10-CM | POA: Diagnosis not present

## 2015-04-26 DIAGNOSIS — J441 Chronic obstructive pulmonary disease with (acute) exacerbation: Secondary | ICD-10-CM | POA: Diagnosis not present

## 2015-04-26 DIAGNOSIS — I10 Essential (primary) hypertension: Secondary | ICD-10-CM | POA: Diagnosis not present

## 2015-04-26 DIAGNOSIS — E119 Type 2 diabetes mellitus without complications: Secondary | ICD-10-CM | POA: Diagnosis not present

## 2015-04-27 DIAGNOSIS — I10 Essential (primary) hypertension: Secondary | ICD-10-CM | POA: Diagnosis not present

## 2015-04-27 DIAGNOSIS — E119 Type 2 diabetes mellitus without complications: Secondary | ICD-10-CM | POA: Diagnosis not present

## 2015-04-27 DIAGNOSIS — R0600 Dyspnea, unspecified: Secondary | ICD-10-CM | POA: Diagnosis not present

## 2015-04-27 DIAGNOSIS — J441 Chronic obstructive pulmonary disease with (acute) exacerbation: Secondary | ICD-10-CM | POA: Diagnosis not present

## 2015-04-28 DIAGNOSIS — R0602 Shortness of breath: Secondary | ICD-10-CM | POA: Diagnosis not present

## 2015-05-13 DIAGNOSIS — F419 Anxiety disorder, unspecified: Secondary | ICD-10-CM | POA: Diagnosis not present

## 2015-05-13 DIAGNOSIS — Z6822 Body mass index (BMI) 22.0-22.9, adult: Secondary | ICD-10-CM | POA: Diagnosis not present

## 2015-05-13 DIAGNOSIS — Z1389 Encounter for screening for other disorder: Secondary | ICD-10-CM | POA: Diagnosis not present

## 2015-05-13 DIAGNOSIS — E782 Mixed hyperlipidemia: Secondary | ICD-10-CM | POA: Diagnosis not present

## 2015-05-13 DIAGNOSIS — J189 Pneumonia, unspecified organism: Secondary | ICD-10-CM | POA: Diagnosis not present

## 2015-05-13 DIAGNOSIS — G894 Chronic pain syndrome: Secondary | ICD-10-CM | POA: Diagnosis not present

## 2015-06-25 DIAGNOSIS — J45909 Unspecified asthma, uncomplicated: Secondary | ICD-10-CM | POA: Diagnosis not present

## 2015-06-25 DIAGNOSIS — F17200 Nicotine dependence, unspecified, uncomplicated: Secondary | ICD-10-CM | POA: Diagnosis not present

## 2015-06-25 DIAGNOSIS — Z79899 Other long term (current) drug therapy: Secondary | ICD-10-CM | POA: Diagnosis not present

## 2015-06-25 DIAGNOSIS — K219 Gastro-esophageal reflux disease without esophagitis: Secondary | ICD-10-CM | POA: Diagnosis not present

## 2015-06-25 DIAGNOSIS — E119 Type 2 diabetes mellitus without complications: Secondary | ICD-10-CM | POA: Diagnosis not present

## 2015-06-25 DIAGNOSIS — Z7984 Long term (current) use of oral hypoglycemic drugs: Secondary | ICD-10-CM | POA: Diagnosis not present

## 2015-06-25 DIAGNOSIS — R0602 Shortness of breath: Secondary | ICD-10-CM | POA: Diagnosis not present

## 2015-06-25 DIAGNOSIS — Z7951 Long term (current) use of inhaled steroids: Secondary | ICD-10-CM | POA: Diagnosis not present

## 2015-06-25 DIAGNOSIS — I10 Essential (primary) hypertension: Secondary | ICD-10-CM | POA: Diagnosis not present

## 2015-06-25 DIAGNOSIS — J441 Chronic obstructive pulmonary disease with (acute) exacerbation: Secondary | ICD-10-CM | POA: Diagnosis not present

## 2015-07-09 DIAGNOSIS — J45909 Unspecified asthma, uncomplicated: Secondary | ICD-10-CM | POA: Diagnosis not present

## 2015-07-09 DIAGNOSIS — Z888 Allergy status to other drugs, medicaments and biological substances status: Secondary | ICD-10-CM | POA: Diagnosis not present

## 2015-07-09 DIAGNOSIS — Z882 Allergy status to sulfonamides status: Secondary | ICD-10-CM | POA: Diagnosis not present

## 2015-07-09 DIAGNOSIS — E119 Type 2 diabetes mellitus without complications: Secondary | ICD-10-CM | POA: Diagnosis not present

## 2015-07-09 DIAGNOSIS — Z90710 Acquired absence of both cervix and uterus: Secondary | ICD-10-CM | POA: Diagnosis not present

## 2015-07-09 DIAGNOSIS — Z87891 Personal history of nicotine dependence: Secondary | ICD-10-CM | POA: Diagnosis not present

## 2015-07-09 DIAGNOSIS — Z79899 Other long term (current) drug therapy: Secondary | ICD-10-CM | POA: Diagnosis not present

## 2015-07-09 DIAGNOSIS — J449 Chronic obstructive pulmonary disease, unspecified: Secondary | ICD-10-CM | POA: Diagnosis not present

## 2015-07-09 DIAGNOSIS — Z880 Allergy status to penicillin: Secondary | ICD-10-CM | POA: Diagnosis not present

## 2015-07-09 DIAGNOSIS — I10 Essential (primary) hypertension: Secondary | ICD-10-CM | POA: Diagnosis not present

## 2015-07-09 DIAGNOSIS — Z79891 Long term (current) use of opiate analgesic: Secondary | ICD-10-CM | POA: Diagnosis not present

## 2015-07-09 DIAGNOSIS — G8929 Other chronic pain: Secondary | ICD-10-CM | POA: Diagnosis not present

## 2015-07-09 DIAGNOSIS — G4730 Sleep apnea, unspecified: Secondary | ICD-10-CM | POA: Diagnosis not present

## 2015-07-09 DIAGNOSIS — K219 Gastro-esophageal reflux disease without esophagitis: Secondary | ICD-10-CM | POA: Diagnosis not present

## 2015-07-09 DIAGNOSIS — K589 Irritable bowel syndrome without diarrhea: Secondary | ICD-10-CM | POA: Diagnosis not present

## 2015-07-09 DIAGNOSIS — K861 Other chronic pancreatitis: Secondary | ICD-10-CM | POA: Diagnosis not present

## 2015-07-09 DIAGNOSIS — E079 Disorder of thyroid, unspecified: Secondary | ICD-10-CM | POA: Diagnosis not present

## 2015-07-11 DIAGNOSIS — F329 Major depressive disorder, single episode, unspecified: Secondary | ICD-10-CM | POA: Diagnosis not present

## 2015-08-15 DIAGNOSIS — E782 Mixed hyperlipidemia: Secondary | ICD-10-CM | POA: Diagnosis not present

## 2015-08-15 DIAGNOSIS — E119 Type 2 diabetes mellitus without complications: Secondary | ICD-10-CM | POA: Diagnosis not present

## 2015-08-15 DIAGNOSIS — Z6822 Body mass index (BMI) 22.0-22.9, adult: Secondary | ICD-10-CM | POA: Diagnosis not present

## 2015-08-15 DIAGNOSIS — G894 Chronic pain syndrome: Secondary | ICD-10-CM | POA: Diagnosis not present

## 2015-08-15 DIAGNOSIS — Z1389 Encounter for screening for other disorder: Secondary | ICD-10-CM | POA: Diagnosis not present

## 2015-08-15 DIAGNOSIS — I10 Essential (primary) hypertension: Secondary | ICD-10-CM | POA: Diagnosis not present

## 2015-09-19 DIAGNOSIS — E1165 Type 2 diabetes mellitus with hyperglycemia: Secondary | ICD-10-CM | POA: Diagnosis not present

## 2015-09-19 DIAGNOSIS — Z1389 Encounter for screening for other disorder: Secondary | ICD-10-CM | POA: Diagnosis not present

## 2015-09-19 DIAGNOSIS — E063 Autoimmune thyroiditis: Secondary | ICD-10-CM | POA: Diagnosis not present

## 2015-09-19 DIAGNOSIS — K5792 Diverticulitis of intestine, part unspecified, without perforation or abscess without bleeding: Secondary | ICD-10-CM | POA: Diagnosis not present

## 2015-09-19 DIAGNOSIS — G894 Chronic pain syndrome: Secondary | ICD-10-CM | POA: Diagnosis not present

## 2015-09-19 DIAGNOSIS — Z6822 Body mass index (BMI) 22.0-22.9, adult: Secondary | ICD-10-CM | POA: Diagnosis not present

## 2015-09-19 DIAGNOSIS — J45909 Unspecified asthma, uncomplicated: Secondary | ICD-10-CM | POA: Diagnosis not present

## 2015-10-04 DIAGNOSIS — Z114 Encounter for screening for human immunodeficiency virus [HIV]: Secondary | ICD-10-CM | POA: Diagnosis not present

## 2015-10-04 DIAGNOSIS — N9089 Other specified noninflammatory disorders of vulva and perineum: Secondary | ICD-10-CM | POA: Diagnosis not present

## 2015-10-04 DIAGNOSIS — Z113 Encounter for screening for infections with a predominantly sexual mode of transmission: Secondary | ICD-10-CM | POA: Diagnosis not present

## 2015-11-21 ENCOUNTER — Encounter (HOSPITAL_COMMUNITY): Payer: Self-pay | Admitting: Emergency Medicine

## 2015-11-21 ENCOUNTER — Emergency Department (HOSPITAL_COMMUNITY)
Admission: EM | Admit: 2015-11-21 | Discharge: 2015-11-21 | Disposition: A | Discharge status: Home / Self Care | Payer: Medicare Other | Attending: Emergency Medicine | Admitting: Emergency Medicine

## 2015-11-21 DIAGNOSIS — E1165 Type 2 diabetes mellitus with hyperglycemia: Secondary | ICD-10-CM | POA: Diagnosis not present

## 2015-11-21 DIAGNOSIS — F319 Bipolar disorder, unspecified: Secondary | ICD-10-CM | POA: Diagnosis not present

## 2015-11-21 DIAGNOSIS — E785 Hyperlipidemia, unspecified: Secondary | ICD-10-CM | POA: Diagnosis not present

## 2015-11-21 DIAGNOSIS — J449 Chronic obstructive pulmonary disease, unspecified: Secondary | ICD-10-CM | POA: Diagnosis not present

## 2015-11-21 DIAGNOSIS — E119 Type 2 diabetes mellitus without complications: Secondary | ICD-10-CM | POA: Insufficient documentation

## 2015-11-21 DIAGNOSIS — Z6822 Body mass index (BMI) 22.0-22.9, adult: Secondary | ICD-10-CM | POA: Diagnosis not present

## 2015-11-21 DIAGNOSIS — J45909 Unspecified asthma, uncomplicated: Secondary | ICD-10-CM | POA: Insufficient documentation

## 2015-11-21 DIAGNOSIS — R112 Nausea with vomiting, unspecified: Secondary | ICD-10-CM | POA: Diagnosis present

## 2015-11-21 DIAGNOSIS — F17210 Nicotine dependence, cigarettes, uncomplicated: Secondary | ICD-10-CM | POA: Diagnosis not present

## 2015-11-21 DIAGNOSIS — R1084 Generalized abdominal pain: Secondary | ICD-10-CM

## 2015-11-21 DIAGNOSIS — Z7984 Long term (current) use of oral hypoglycemic drugs: Secondary | ICD-10-CM | POA: Diagnosis not present

## 2015-11-21 DIAGNOSIS — Z79891 Long term (current) use of opiate analgesic: Secondary | ICD-10-CM | POA: Insufficient documentation

## 2015-11-21 DIAGNOSIS — K226 Gastro-esophageal laceration-hemorrhage syndrome: Secondary | ICD-10-CM | POA: Diagnosis not present

## 2015-11-21 DIAGNOSIS — K861 Other chronic pancreatitis: Secondary | ICD-10-CM | POA: Diagnosis not present

## 2015-11-21 DIAGNOSIS — Z1389 Encounter for screening for other disorder: Secondary | ICD-10-CM | POA: Diagnosis not present

## 2015-11-21 DIAGNOSIS — Z79899 Other long term (current) drug therapy: Secondary | ICD-10-CM | POA: Diagnosis not present

## 2015-11-21 DIAGNOSIS — G894 Chronic pain syndrome: Secondary | ICD-10-CM | POA: Diagnosis not present

## 2015-11-21 LAB — CBC
HCT: 34.8 % — ABNORMAL LOW (ref 36.0–46.0)
Hemoglobin: 11.5 g/dL — ABNORMAL LOW (ref 12.0–15.0)
MCH: 27.4 pg (ref 26.0–34.0)
MCHC: 33 g/dL (ref 30.0–36.0)
MCV: 83.1 fL (ref 78.0–100.0)
Platelets: 350 K/uL (ref 150–400)
RBC: 4.19 MIL/uL (ref 3.87–5.11)
RDW: 15.1 % (ref 11.5–15.5)
WBC: 19.1 K/uL — ABNORMAL HIGH (ref 4.0–10.5)

## 2015-11-21 LAB — COMPREHENSIVE METABOLIC PANEL WITH GFR
ALT: 15 U/L (ref 14–54)
AST: 18 U/L (ref 15–41)
Albumin: 4 g/dL (ref 3.5–5.0)
Alkaline Phosphatase: 66 U/L (ref 38–126)
Anion gap: 11 (ref 5–15)
BUN: 12 mg/dL (ref 6–20)
CO2: 26 mmol/L (ref 22–32)
Calcium: 9.1 mg/dL (ref 8.9–10.3)
Chloride: 100 mmol/L — ABNORMAL LOW (ref 101–111)
Creatinine, Ser: 0.77 mg/dL (ref 0.44–1.00)
GFR calc Af Amer: >60 mL/min
GFR calc non Af Amer: >60 mL/min
Glucose, Bld: 83 mg/dL (ref 65–99)
Potassium: 3.8 mmol/L (ref 3.5–5.1)
Sodium: 137 mmol/L (ref 135–145)
Total Bilirubin: 0.1 mg/dL — ABNORMAL LOW (ref 0.3–1.2)
Total Protein: 6.9 g/dL (ref 6.5–8.1)

## 2015-11-21 LAB — LIPASE, BLOOD: Lipase: 11 U/L (ref 11–51)

## 2015-11-21 LAB — URINALYSIS, ROUTINE W REFLEX MICROSCOPIC
Bilirubin Urine: NEGATIVE
Glucose, UA: NEGATIVE mg/dL
Hgb urine dipstick: NEGATIVE
KETONES UR: NEGATIVE mg/dL
LEUKOCYTES UA: NEGATIVE
Nitrite: NEGATIVE
PH: 5.5 (ref 5.0–8.0)
Protein, ur: NEGATIVE mg/dL
Specific Gravity, Urine: 1.025 (ref 1.005–1.030)

## 2015-11-21 MED ORDER — ONDANSETRON HCL 4 MG/2ML IJ SOLN
4.0000 mg | Freq: Once | INTRAMUSCULAR | Status: AC
  Administered 2015-11-21: 4 mg via INTRAVENOUS
  Filled 2015-11-21: qty 2

## 2015-11-21 MED ORDER — PANTOPRAZOLE SODIUM 40 MG IV SOLR
40.0000 mg | Freq: Once | INTRAVENOUS | Status: AC
  Administered 2015-11-21: 40 mg via INTRAVENOUS
  Filled 2015-11-21: qty 40

## 2015-11-21 MED ORDER — HYDROMORPHONE HCL 1 MG/ML IJ SOLN
0.5000 mg | Freq: Once | INTRAMUSCULAR | Status: AC
  Administered 2015-11-21: 0.5 mg via INTRAVENOUS
  Filled 2015-11-21: qty 1

## 2015-11-21 MED ORDER — SODIUM CHLORIDE 0.9 % IV BOLUS (SEPSIS)
1000.0000 mL | Freq: Once | INTRAVENOUS | Status: AC
  Administered 2015-11-21: 1000 mL via INTRAVENOUS

## 2015-11-21 MED ORDER — HYDROMORPHONE HCL 1 MG/ML IJ SOLN
1.0000 mg | INTRAMUSCULAR | Status: DC | PRN
  Administered 2015-11-21: 1 mg via INTRAVENOUS
  Filled 2015-11-21: qty 1

## 2015-11-21 NOTE — ED Notes (Signed)
Pt states she has been vomiting for the past few days with abd pain.  Started vomiting blood this morning and went to pcp and was sent here.

## 2015-11-21 NOTE — Discharge Instructions (Signed)

## 2015-11-22 NOTE — ED Provider Notes (Signed)
CSN: [id number]     Arrival date & time 11/21/15  1414 History   First MD Initiated Contact with Patient 11/21/15 1648     Chief Complaint  Patient presents with  . Emesis      HPI  Atrial fibrillation returns for evaluation of abdominal pain.  She had multiple episodes of atelectasis in the past.  Times mental check pancreatitis.  Denies alcohol use.  History of hepatobiliary abnormalities.  Multiple similar episodes.  Symptoms started within the last 24 hour's period  Nonbilious emesis.  Her blood pressure is in the stools.  In addition for his hematuria.  No fever no chills.  No palmar symptoms.  Past Medical History  Diagnosis Date  . Asthma   . Depression   . Migraine   . Pancreatitis chronic Idiopathic    Treated by Dr. Newman Pies at [facility] in the past with celiac blocks.  . Bipolar 1 disorder ([place])   . Hyperlipidemia 02/03/2012  . Diabetes mellitus   . COPD (chronic obstructive pulmonary disease) ([place])   . Neck pain 06/08/2012  . Chronic abdominal pain   . Anemia   . Anxiety   . Hiatal hernia   . Esophagitis    Past Surgical History  Procedure Laterality Date  . Cholecystectomy    . Tonsillectomy    . Abdominal hysterectomy    . Esophagogastroduodenoscopy  02/20/2008    [id number] distal esophageal mucosa, suspicious for neoplasm/Hiatal hernia otherwise normal   . Esophagogastroduodenoscopy  04/03/2008    [id number] hiatal hernia, otherwise normal/Short, tight, benign-appearing peptic stricture  . Ileocolonoscopy  06/05/2008      [id number] anal canal, otherwise normal rectum, colon  . Esophagogastroduodenoscopy  November 16, 2012    Dr. Britta Mccreedy: hiatal hernia, esophagitis,   . Givens capsule study N/A 12/05/2012    Few superficial erosions but nothing found to explain iron deficiency anemia.   . Colonoscopy N/A 12/05/2012    [id number] rectum, colon and terminal ileum  . Celiac plexus block     Family History  Problem Relation Age of Onset  .  Asthma Other   . Coronary artery disease Neg Hx   . Colon cancer Neg Hx   . Asthma Paternal Grandfather    Social History  Substance Use Topics  . Smoking status: Current Every Day Smoker -- 1.00 packs/day for 0 years    Types: Cigarettes  . Smokeless tobacco: Former Systems developer  . Alcohol Use: No   OB History    No data available     Review of Systems  Constitutional: Negative for fever, chills, diaphoresis, appetite change and fatigue.  HENT: Negative for mouth sores, sore throat and trouble swallowing.   Eyes: Negative for visual disturbance.  Respiratory: Negative for cough, chest tightness, shortness of breath and wheezing.   Cardiovascular: Negative for chest pain.  Gastrointestinal: Positive for nausea, vomiting, abdominal pain and diarrhea. Negative for abdominal distention.  Endocrine: Negative for polydipsia, polyphagia and polyuria.  Genitourinary: Negative for dysuria, frequency and hematuria.  Musculoskeletal: Negative for gait problem.  Skin: Negative for color change, pallor and rash.  Neurological: Negative for dizziness, syncope, light-headedness and headaches.  Hematological: Does not bruise/bleed easily.  Psychiatric/Behavioral: Negative for behavioral problems and confusion.      Allergies  Penicillins; Sulfa antibiotics; Aspirin; Lovenox ; and Prednisone  Home Medications   Prior to Admission medications   Medication Sig Start Date End Date Taking? Authorizing Provider  albuterol (PROVENTIL HFA;VENTOLIN HFA) 108 (90 BASE) MCG/ACT inhaler Inhale 2  puffs into the lungs daily as needed for wheezing. For shortness of breath   Yes Historical Provider, MD  amitriptyline (ELAVIL) 75 MG tablet Take 75 mg by mouth at bedtime.   Yes Historical Provider, MD  amLODipine (NORVASC) 10 MG tablet Take 10 mg by mouth at bedtime.    Yes Historical Provider, MD  budesonide-formoterol (SYMBICORT) 80-4.5 MCG/ACT inhaler Inhale 2 puffs into the lungs 2 (two) times daily. Patient  taking differently: Inhale 1 puff into the lungs 2 (two) times daily.  03/30/14  Yes Samuella Cota, MD  docusate sodium (COLACE) 100 MG capsule Take 100 mg by mouth at bedtime.    Yes Historical Provider, MD  ferrous sulfate 325 (65 FE) MG tablet Take 325 mg by mouth daily.   Yes Historical Provider, MD  hydrOXYzine (VISTARIL) 25 MG capsule Take 25 mg by mouth 4 (four) times daily.   Yes Historical Provider, MD  ipratropium-albuterol (DUONEB) 0.5-2.5 (3) MG/3ML SOLN Inhale 3 mLs into the lungs every 6 (six) hours as needed. Take 3 mLs by nebulization every 6 (six) hours as needed for Wheezing. 07/01/13  Yes Shanker Kristeen Mans, MD  levothyroxine (SYNTHROID, LEVOTHROID) 25 MCG tablet Take 25 mcg by mouth daily before breakfast.   Yes Historical Provider, MD  LORazepam (ATIVAN) 0.5 MG tablet Take 0.5 mg by mouth every 8 (eight) hours as needed for anxiety.    Yes Historical Provider, MD  metFORMIN (GLUCOPHAGE) 500 MG tablet Take 1,000 mg by mouth 2 (two) times daily with a meal.  07/01/13  Yes Shanker Kristeen Mans, MD  oxycodone (ROXICODONE) 30 MG immediate release tablet Take 30 mg by mouth every 3 (three) hours as needed for pain.   Yes Historical Provider, MD  OXYGEN Inhale 3 L into the lungs daily as needed (for shortness of breath).   Yes Historical Provider, MD  Pancrelipase, Lip-Prot-Amyl, (ZENPEP) 20000 UNITS CPEP Take 2-3 capsules by mouth 5 (five) times daily. 3 capsules three times daily with each meal and 2 capsules with each snack.   Yes Historical Provider, MD  pantoprazole (PROTONIX) 40 MG tablet Take 40 mg by mouth 2 (two) times daily.   Yes Historical Provider, MD  polyethylene glycol powder (GLYCOLAX/MIRALAX) powder Take 17 g by mouth 2 (two) times daily. Until daily soft stools  OTC 03/05/15  Yes Nona Dell, PA-C  promethazine (PHENERGAN) 25 MG tablet Take 25 mg by mouth every 6 (six) hours as needed for nausea or vomiting.   Yes Historical Provider, MD  conjugated estrogens  (PREMARIN) vaginal cream Apply figertip amount to swollen area daily Patient not taking: Reported on 12/06/2014 04/07/14   Orpah Greek, MD   BP 125/79 mmHg  Pulse 102  Temp(Src) 98.1 F (36.7 C) (Oral)  Resp 16  Ht 5\' 2"  (1.575 m)  Wt 128 lb (58.06 kg)  BMI 23.41 kg/m2  SpO2 97% Physical Exam  Constitutional: She is oriented to person, place, and time. She appears well-developed and well-nourished. No distress.  HENT:  Head: Normocephalic.  Eyes: Conjunctivae are normal. Pupils are equal, round, and reactive to light. No scleral icterus.  Neck: Normal range of motion. Neck supple. No thyromegaly present.  Cardiovascular: Normal rate and regular rhythm.  Exam reveals no gallop and no friction rub.   No murmur heard. Pulmonary/Chest: Effort normal and breath sounds normal. No respiratory distress. She has no wheezes. She has no rales.  Abdominal: Soft. Bowel sounds are normal. She exhibits no distension. There is no tenderness. There  is no rebound.  No localizing tenderness.  No perineal irritation.  No flank tenderness.  No skin rash.  Musculoskeletal: Normal range of motion.  Neurological: She is alert and oriented to person, place, and time.  Skin: Skin is warm and dry. No rash noted.  Psychiatric: She has a normal mood and affect. Her behavior is normal.    ED Course  Procedures (including critical care time) Labs Review Labs Reviewed  COMPREHENSIVE METABOLIC PANEL - Abnormal; Notable for the following:    Chloride 100 (*)    Total Bilirubin 0.1 (*)    All other components within normal limits  CBC - Abnormal; Notable for the following:    WBC 19.1 (*)    Hemoglobin 11.5 (*)    HCT 34.8 (*)    All other components within normal limits  LIPASE, BLOOD  URINALYSIS, ROUTINE W REFLEX MICROSCOPIC (NOT AT [facility])    Imaging Review No results found. I have personally reviewed and evaluated these images and lab results as part of my medical decision-making.   EKG  Interpretation None      MDM   Final diagnoses:  Generalized abdominal pain    Leukocytosis.  No hepatobiliary enzyme abnormalities could lipase 11.  She claimed of continued pain.  No peritoneal irritation or localizing symptoms.  Despite leukocytosis I'm not concerned that this is an intraperitoneal process is separate of infection.  She is not febrile.  She was resting comfortably despite complaining that her pain had improved with 3, "rebounded to an 8".  Premedicated.  Discharge.  Plan clear liquids.  Recheck fever, worsening pain, inability to hold down fluids, or other evolution in the next several hours or over the next 24 hours.   Tanna Furry, MD 11/22/15 [phone]

## 2015-12-12 ENCOUNTER — Ambulatory Visit (HOSPITAL_COMMUNITY)
Admission: RE | Admit: 2015-12-12 | Discharge: 2015-12-12 | Disposition: A | Discharge status: Home / Self Care | Payer: Medicare Other | Source: Ambulatory Visit | Attending: Internal Medicine | Admitting: Internal Medicine

## 2015-12-12 ENCOUNTER — Other Ambulatory Visit (HOSPITAL_COMMUNITY): Payer: Self-pay | Admitting: Internal Medicine

## 2015-12-12 DIAGNOSIS — R05 Cough: Secondary | ICD-10-CM | POA: Diagnosis not present

## 2015-12-12 DIAGNOSIS — R079 Chest pain, unspecified: Secondary | ICD-10-CM | POA: Diagnosis not present

## 2015-12-12 DIAGNOSIS — Z1389 Encounter for screening for other disorder: Secondary | ICD-10-CM | POA: Diagnosis not present

## 2015-12-12 DIAGNOSIS — K9289 Other specified diseases of the digestive system: Secondary | ICD-10-CM | POA: Diagnosis not present

## 2015-12-12 DIAGNOSIS — E063 Autoimmune thyroiditis: Secondary | ICD-10-CM | POA: Diagnosis not present

## 2015-12-12 DIAGNOSIS — D649 Anemia, unspecified: Secondary | ICD-10-CM | POA: Diagnosis not present

## 2015-12-12 DIAGNOSIS — Z6823 Body mass index (BMI) 23.0-23.9, adult: Secondary | ICD-10-CM | POA: Diagnosis not present

## 2015-12-12 DIAGNOSIS — E119 Type 2 diabetes mellitus without complications: Secondary | ICD-10-CM | POA: Diagnosis not present

## 2015-12-12 DIAGNOSIS — G894 Chronic pain syndrome: Secondary | ICD-10-CM | POA: Diagnosis not present

## 2015-12-12 DIAGNOSIS — J209 Acute bronchitis, unspecified: Secondary | ICD-10-CM | POA: Diagnosis not present

## 2015-12-12 DIAGNOSIS — E1165 Type 2 diabetes mellitus with hyperglycemia: Secondary | ICD-10-CM | POA: Diagnosis not present

## 2015-12-12 DIAGNOSIS — R109 Unspecified abdominal pain: Secondary | ICD-10-CM | POA: Diagnosis not present

## 2015-12-12 DIAGNOSIS — Z79899 Other long term (current) drug therapy: Secondary | ICD-10-CM | POA: Diagnosis not present

## 2015-12-12 DIAGNOSIS — K449 Diaphragmatic hernia without obstruction or gangrene: Secondary | ICD-10-CM | POA: Diagnosis not present

## 2016-01-03 ENCOUNTER — Encounter: Payer: Self-pay | Admitting: Internal Medicine

## 2016-02-04 ENCOUNTER — Telehealth: Payer: Self-pay | Admitting: Nurse Practitioner

## 2016-02-04 ENCOUNTER — Encounter: Payer: Self-pay | Admitting: Nurse Practitioner

## 2016-02-04 ENCOUNTER — Ambulatory Visit: Payer: Medicare Other | Admitting: Nurse Practitioner

## 2016-02-04 NOTE — Telephone Encounter (Signed)
PATIENT WAS A NO SHOW AND LETTER SENT  °

## 2016-02-06 NOTE — Telephone Encounter (Signed)
Noted  

## 2016-02-19 DIAGNOSIS — Z1231 Encounter for screening mammogram for malignant neoplasm of breast: Secondary | ICD-10-CM | POA: Diagnosis not present

## 2016-03-10 DIAGNOSIS — E1165 Type 2 diabetes mellitus with hyperglycemia: Secondary | ICD-10-CM | POA: Diagnosis not present

## 2016-03-10 DIAGNOSIS — Z6824 Body mass index (BMI) 24.0-24.9, adult: Secondary | ICD-10-CM | POA: Diagnosis not present

## 2016-03-10 DIAGNOSIS — D649 Anemia, unspecified: Secondary | ICD-10-CM | POA: Diagnosis not present

## 2016-03-10 DIAGNOSIS — I10 Essential (primary) hypertension: Secondary | ICD-10-CM | POA: Diagnosis not present

## 2016-03-10 DIAGNOSIS — L84 Corns and callosities: Secondary | ICD-10-CM | POA: Diagnosis not present

## 2016-04-04 DIAGNOSIS — J9610 Chronic respiratory failure, unspecified whether with hypoxia or hypercapnia: Secondary | ICD-10-CM | POA: Diagnosis not present

## 2016-04-04 DIAGNOSIS — J449 Chronic obstructive pulmonary disease, unspecified: Secondary | ICD-10-CM | POA: Diagnosis not present

## 2016-04-17 DIAGNOSIS — M21622 Bunionette of left foot: Secondary | ICD-10-CM | POA: Diagnosis not present

## 2016-04-17 DIAGNOSIS — M21621 Bunionette of right foot: Secondary | ICD-10-CM | POA: Diagnosis not present

## 2016-04-17 DIAGNOSIS — M71572 Other bursitis, not elsewhere classified, left ankle and foot: Secondary | ICD-10-CM | POA: Diagnosis not present

## 2016-04-17 DIAGNOSIS — M7752 Other enthesopathy of left foot: Secondary | ICD-10-CM | POA: Diagnosis not present

## 2016-04-24 DIAGNOSIS — G5790 Unspecified mononeuropathy of unspecified lower limb: Secondary | ICD-10-CM | POA: Diagnosis not present

## 2016-04-24 DIAGNOSIS — M71571 Other bursitis, not elsewhere classified, right ankle and foot: Secondary | ICD-10-CM | POA: Diagnosis not present

## 2016-04-24 DIAGNOSIS — M71572 Other bursitis, not elsewhere classified, left ankle and foot: Secondary | ICD-10-CM | POA: Diagnosis not present

## 2016-05-28 DIAGNOSIS — I5032 Chronic diastolic (congestive) heart failure: Secondary | ICD-10-CM | POA: Diagnosis not present

## 2016-05-28 DIAGNOSIS — J9601 Acute respiratory failure with hypoxia: Secondary | ICD-10-CM | POA: Diagnosis not present

## 2016-05-28 DIAGNOSIS — J441 Chronic obstructive pulmonary disease with (acute) exacerbation: Secondary | ICD-10-CM | POA: Diagnosis not present

## 2016-05-28 DIAGNOSIS — R1310 Dysphagia, unspecified: Secondary | ICD-10-CM | POA: Diagnosis not present

## 2016-05-28 DIAGNOSIS — J189 Pneumonia, unspecified organism: Secondary | ICD-10-CM | POA: Diagnosis not present

## 2016-05-28 DIAGNOSIS — R0602 Shortness of breath: Secondary | ICD-10-CM | POA: Diagnosis not present

## 2016-05-28 DIAGNOSIS — R05 Cough: Secondary | ICD-10-CM | POA: Diagnosis not present

## 2016-05-28 DIAGNOSIS — F419 Anxiety disorder, unspecified: Secondary | ICD-10-CM | POA: Diagnosis not present

## 2016-05-28 DIAGNOSIS — E119 Type 2 diabetes mellitus without complications: Secondary | ICD-10-CM | POA: Diagnosis not present

## 2016-05-28 DIAGNOSIS — K219 Gastro-esophageal reflux disease without esophagitis: Secondary | ICD-10-CM | POA: Diagnosis not present

## 2016-05-28 DIAGNOSIS — Z9981 Dependence on supplemental oxygen: Secondary | ICD-10-CM | POA: Diagnosis not present

## 2016-05-28 DIAGNOSIS — J440 Chronic obstructive pulmonary disease with acute lower respiratory infection: Secondary | ICD-10-CM | POA: Diagnosis not present

## 2016-06-02 DIAGNOSIS — G894 Chronic pain syndrome: Secondary | ICD-10-CM | POA: Diagnosis not present

## 2016-06-02 DIAGNOSIS — E119 Type 2 diabetes mellitus without complications: Secondary | ICD-10-CM | POA: Diagnosis not present

## 2016-06-02 DIAGNOSIS — Z6824 Body mass index (BMI) 24.0-24.9, adult: Secondary | ICD-10-CM | POA: Diagnosis not present

## 2016-06-02 DIAGNOSIS — J449 Chronic obstructive pulmonary disease, unspecified: Secondary | ICD-10-CM | POA: Diagnosis not present

## 2016-06-10 DIAGNOSIS — R0602 Shortness of breath: Secondary | ICD-10-CM | POA: Diagnosis not present

## 2016-06-10 DIAGNOSIS — Z7984 Long term (current) use of oral hypoglycemic drugs: Secondary | ICD-10-CM | POA: Diagnosis not present

## 2016-06-10 DIAGNOSIS — I10 Essential (primary) hypertension: Secondary | ICD-10-CM | POA: Diagnosis not present

## 2016-06-10 DIAGNOSIS — K219 Gastro-esophageal reflux disease without esophagitis: Secondary | ICD-10-CM | POA: Diagnosis not present

## 2016-06-10 DIAGNOSIS — J449 Chronic obstructive pulmonary disease, unspecified: Secondary | ICD-10-CM | POA: Diagnosis not present

## 2016-06-10 DIAGNOSIS — R609 Edema, unspecified: Secondary | ICD-10-CM | POA: Diagnosis not present

## 2016-06-10 DIAGNOSIS — R0902 Hypoxemia: Secondary | ICD-10-CM | POA: Diagnosis not present

## 2016-06-10 DIAGNOSIS — R600 Localized edema: Secondary | ICD-10-CM | POA: Diagnosis not present

## 2016-06-10 DIAGNOSIS — J189 Pneumonia, unspecified organism: Secondary | ICD-10-CM | POA: Diagnosis not present

## 2016-06-10 DIAGNOSIS — E119 Type 2 diabetes mellitus without complications: Secondary | ICD-10-CM | POA: Diagnosis not present

## 2016-06-10 DIAGNOSIS — F419 Anxiety disorder, unspecified: Secondary | ICD-10-CM | POA: Diagnosis not present

## 2016-06-10 DIAGNOSIS — F329 Major depressive disorder, single episode, unspecified: Secondary | ICD-10-CM | POA: Diagnosis not present

## 2016-06-11 DIAGNOSIS — R0902 Hypoxemia: Secondary | ICD-10-CM | POA: Diagnosis not present

## 2016-06-11 DIAGNOSIS — J189 Pneumonia, unspecified organism: Secondary | ICD-10-CM | POA: Diagnosis not present

## 2016-06-15 DIAGNOSIS — M549 Dorsalgia, unspecified: Secondary | ICD-10-CM | POA: Diagnosis not present

## 2016-06-15 DIAGNOSIS — E119 Type 2 diabetes mellitus without complications: Secondary | ICD-10-CM | POA: Diagnosis not present

## 2016-06-15 DIAGNOSIS — E039 Hypothyroidism, unspecified: Secondary | ICD-10-CM | POA: Diagnosis not present

## 2016-06-15 DIAGNOSIS — R601 Generalized edema: Secondary | ICD-10-CM | POA: Diagnosis not present

## 2016-06-15 DIAGNOSIS — I10 Essential (primary) hypertension: Secondary | ICD-10-CM | POA: Diagnosis not present

## 2016-06-15 DIAGNOSIS — M7989 Other specified soft tissue disorders: Secondary | ICD-10-CM | POA: Diagnosis not present

## 2016-06-15 DIAGNOSIS — K56600 Partial intestinal obstruction, unspecified as to cause: Secondary | ICD-10-CM | POA: Diagnosis not present

## 2016-06-15 DIAGNOSIS — K449 Diaphragmatic hernia without obstruction or gangrene: Secondary | ICD-10-CM | POA: Diagnosis not present

## 2016-06-15 DIAGNOSIS — B341 Enterovirus infection, unspecified: Secondary | ICD-10-CM | POA: Diagnosis not present

## 2016-06-15 DIAGNOSIS — J449 Chronic obstructive pulmonary disease, unspecified: Secondary | ICD-10-CM | POA: Diagnosis not present

## 2016-06-15 DIAGNOSIS — K56699 Other intestinal obstruction unspecified as to partial versus complete obstruction: Secondary | ICD-10-CM | POA: Diagnosis not present

## 2016-06-15 DIAGNOSIS — R112 Nausea with vomiting, unspecified: Secondary | ICD-10-CM | POA: Diagnosis not present

## 2016-06-15 DIAGNOSIS — D72829 Elevated white blood cell count, unspecified: Secondary | ICD-10-CM | POA: Diagnosis not present

## 2016-06-15 DIAGNOSIS — R1030 Lower abdominal pain, unspecified: Secondary | ICD-10-CM | POA: Diagnosis not present

## 2016-06-15 DIAGNOSIS — R109 Unspecified abdominal pain: Secondary | ICD-10-CM | POA: Diagnosis not present

## 2016-06-15 DIAGNOSIS — K56609 Unspecified intestinal obstruction, unspecified as to partial versus complete obstruction: Secondary | ICD-10-CM | POA: Diagnosis not present

## 2016-06-15 DIAGNOSIS — T40605A Adverse effect of unspecified narcotics, initial encounter: Secondary | ICD-10-CM | POA: Diagnosis not present

## 2016-06-15 DIAGNOSIS — K5900 Constipation, unspecified: Secondary | ICD-10-CM | POA: Diagnosis not present

## 2016-08-17 DIAGNOSIS — G894 Chronic pain syndrome: Secondary | ICD-10-CM | POA: Diagnosis not present

## 2016-08-17 DIAGNOSIS — E063 Autoimmune thyroiditis: Secondary | ICD-10-CM | POA: Diagnosis not present

## 2016-08-17 DIAGNOSIS — Z6822 Body mass index (BMI) 22.0-22.9, adult: Secondary | ICD-10-CM | POA: Diagnosis not present

## 2016-08-17 DIAGNOSIS — F329 Major depressive disorder, single episode, unspecified: Secondary | ICD-10-CM | POA: Diagnosis not present

## 2016-08-17 DIAGNOSIS — E1165 Type 2 diabetes mellitus with hyperglycemia: Secondary | ICD-10-CM | POA: Diagnosis not present

## 2016-08-17 DIAGNOSIS — F319 Bipolar disorder, unspecified: Secondary | ICD-10-CM | POA: Diagnosis not present

## 2016-08-17 DIAGNOSIS — Z1389 Encounter for screening for other disorder: Secondary | ICD-10-CM | POA: Diagnosis not present

## 2016-08-17 DIAGNOSIS — I10 Essential (primary) hypertension: Secondary | ICD-10-CM | POA: Diagnosis not present

## 2016-08-17 DIAGNOSIS — F419 Anxiety disorder, unspecified: Secondary | ICD-10-CM | POA: Diagnosis not present

## 2016-09-09 DIAGNOSIS — G4733 Obstructive sleep apnea (adult) (pediatric): Secondary | ICD-10-CM | POA: Diagnosis not present

## 2016-10-21 DIAGNOSIS — J449 Chronic obstructive pulmonary disease, unspecified: Secondary | ICD-10-CM | POA: Diagnosis not present

## 2016-10-21 DIAGNOSIS — E063 Autoimmune thyroiditis: Secondary | ICD-10-CM | POA: Diagnosis not present

## 2016-10-21 DIAGNOSIS — E119 Type 2 diabetes mellitus without complications: Secondary | ICD-10-CM | POA: Diagnosis not present

## 2016-10-21 DIAGNOSIS — K219 Gastro-esophageal reflux disease without esophagitis: Secondary | ICD-10-CM | POA: Diagnosis not present

## 2016-10-21 DIAGNOSIS — Z6824 Body mass index (BMI) 24.0-24.9, adult: Secondary | ICD-10-CM | POA: Diagnosis not present

## 2016-10-21 DIAGNOSIS — G894 Chronic pain syndrome: Secondary | ICD-10-CM | POA: Diagnosis not present

## 2016-10-21 DIAGNOSIS — I341 Nonrheumatic mitral (valve) prolapse: Secondary | ICD-10-CM | POA: Diagnosis not present

## 2016-10-24 DIAGNOSIS — Z87891 Personal history of nicotine dependence: Secondary | ICD-10-CM | POA: Diagnosis not present

## 2016-10-24 DIAGNOSIS — K5900 Constipation, unspecified: Secondary | ICD-10-CM | POA: Diagnosis not present

## 2016-10-24 DIAGNOSIS — I10 Essential (primary) hypertension: Secondary | ICD-10-CM | POA: Diagnosis not present

## 2016-10-24 DIAGNOSIS — Z79899 Other long term (current) drug therapy: Secondary | ICD-10-CM | POA: Diagnosis not present

## 2016-10-24 DIAGNOSIS — R1084 Generalized abdominal pain: Secondary | ICD-10-CM | POA: Diagnosis not present

## 2016-10-24 DIAGNOSIS — E119 Type 2 diabetes mellitus without complications: Secondary | ICD-10-CM | POA: Diagnosis not present

## 2016-10-24 DIAGNOSIS — Z7984 Long term (current) use of oral hypoglycemic drugs: Secondary | ICD-10-CM | POA: Diagnosis not present

## 2016-10-24 DIAGNOSIS — R1030 Lower abdominal pain, unspecified: Secondary | ICD-10-CM | POA: Diagnosis not present

## 2016-11-05 DIAGNOSIS — K625 Hemorrhage of anus and rectum: Secondary | ICD-10-CM | POA: Diagnosis not present

## 2016-11-26 DIAGNOSIS — K625 Hemorrhage of anus and rectum: Secondary | ICD-10-CM | POA: Diagnosis not present

## 2016-11-26 DIAGNOSIS — K861 Other chronic pancreatitis: Secondary | ICD-10-CM | POA: Diagnosis not present

## 2016-12-10 ENCOUNTER — Encounter: Payer: Self-pay | Admitting: Cardiology

## 2016-12-10 ENCOUNTER — Ambulatory Visit (INDEPENDENT_AMBULATORY_CARE_PROVIDER_SITE_OTHER): Payer: Medicare Other | Admitting: Cardiology

## 2016-12-10 VITALS — BP 106/66 | HR 104 | Ht 62.0 in | Wt 146.0 lb

## 2016-12-10 DIAGNOSIS — R9431 Abnormal electrocardiogram [ECG] [EKG]: Secondary | ICD-10-CM | POA: Diagnosis not present

## 2016-12-10 DIAGNOSIS — I10 Essential (primary) hypertension: Secondary | ICD-10-CM | POA: Diagnosis not present

## 2016-12-10 DIAGNOSIS — R011 Cardiac murmur, unspecified: Secondary | ICD-10-CM

## 2016-12-10 MED ORDER — FUROSEMIDE 20 MG PO TABS: 20.0000 mg | ORAL_TABLET | Freq: Every day | ORAL | 3 refills | Status: DC

## 2016-12-10 NOTE — Progress Notes (Addendum)
Clinical Summary [name] is a 46 y.o.female seen as a new consult, referred by Dr Gerarda Fraction for history of heart murmur  1. Heart murmur -new diagnosis by pcp - chronic SOB she relates to her asthma. Can have some fatigue with activity - + LE in legs at times. No chest pain, no syncope  2. HTN - compliant with meds  Past Medical History:  Diagnosis Date  . Anemia   . Anxiety   . Asthma   . Bipolar 1 disorder ([place])   . Chronic abdominal pain   . COPD (chronic obstructive pulmonary disease) ([place])   . Depression   . Diabetes mellitus   . Esophagitis   . Hiatal hernia   . Hyperlipidemia 02/03/2012  . Migraine   . Neck pain 06/08/2012  . Pancreatitis chronic Idiopathic   Treated by Dr. Newman Pies at [facility] in the past with celiac blocks.     Allergies  Allergen Reactions  . Penicillins Shortness Of Breath     Has patient had a PCN reaction causing immediate rash, facial/tongue/throat swelling, SOB or lightheadedness with hypotension: Yes Has patient had a PCN reaction causing severe rash involving mucus membranes or skin necrosis: Yes Has patient had a PCN reaction that required hospitalization No Has patient had a PCN reaction occurring within the last 10 years: No  If all of the above answers are "NO", then may proceed with Cephalosporin use.   . Sulfa Antibiotics Shortness Of Breath  . Aspirin Nausea And Vomiting  . Lovenox  [Enoxaparin Sodium] Other (See Comments)    Has been known to cause her blood clots in her legs  . Prednisone Other (See Comments)    Pancreatitis, but has to take for asthma sometimes     Current Outpatient Prescriptions  Medication Sig Dispense Refill  . albuterol (PROVENTIL HFA;VENTOLIN HFA) 108 (90 BASE) MCG/ACT inhaler Inhale 2 puffs into the lungs daily as needed for wheezing. For shortness of breath    . amitriptyline (ELAVIL) 75 MG tablet Take 75 mg by mouth at bedtime.    Marland Kitchen amLODipine (NORVASC) 10 MG tablet Take 10 mg by  mouth at bedtime.     . budesonide-formoterol (SYMBICORT) 80-4.5 MCG/ACT inhaler Inhale 2 puffs into the lungs 2 (two) times daily. (Patient taking differently: Inhale 1 puff into the lungs 2 (two) times daily. ) 1 Inhaler 0  . conjugated estrogens (PREMARIN) vaginal cream Apply figertip amount to swollen area daily (Patient not taking: Reported on 12/06/2014) 42.5 g 0  . docusate sodium (COLACE) 100 MG capsule Take 100 mg by mouth at bedtime.     . ferrous sulfate 325 (65 FE) MG tablet Take 325 mg by mouth daily.    . hydrOXYzine (VISTARIL) 25 MG capsule Take 25 mg by mouth 4 (four) times daily.    Marland Kitchen ipratropium-albuterol (DUONEB) 0.5-2.5 (3) MG/3ML SOLN Inhale 3 mLs into the lungs every 6 (six) hours as needed. Take 3 mLs by nebulization every 6 (six) hours as needed for Wheezing. 360 mL 0  . levothyroxine (SYNTHROID, LEVOTHROID) 25 MCG tablet Take 25 mcg by mouth daily before breakfast.    . LORazepam (ATIVAN) 0.5 MG tablet Take 0.5 mg by mouth every 8 (eight) hours as needed for anxiety.     . metFORMIN (GLUCOPHAGE) 500 MG tablet Take 1,000 mg by mouth 2 (two) times daily with a meal.     . oxycodone (ROXICODONE) 30 MG immediate release tablet Take 30 mg by mouth every 3 (  three) hours as needed for pain.    . OXYGEN Inhale 3 L into the lungs daily as needed (for shortness of breath).    . Pancrelipase, Lip-Prot-Amyl, (ZENPEP) 20000 UNITS CPEP Take 2-3 capsules by mouth 5 (five) times daily. 3 capsules three times daily with each meal and 2 capsules with each snack.    . pantoprazole (PROTONIX) 40 MG tablet Take 40 mg by mouth 2 (two) times daily.    . polyethylene glycol powder (GLYCOLAX/MIRALAX) powder Take 17 g by mouth 2 (two) times daily. Until daily soft stools  OTC 250 g 0  . promethazine (PHENERGAN) 25 MG tablet Take 25 mg by mouth every 6 (six) hours as needed for nausea or vomiting.     No current facility-administered medications for this visit.      Past Surgical History:    Procedure Laterality Date  . ABDOMINAL HYSTERECTOMY    . CELIAC PLEXUS BLOCK    . CHOLECYSTECTOMY    . COLONOSCOPY N/A 12/05/2012   JHE:[id number] rectum, colon and terminal ileum  . ESOPHAGOGASTRODUODENOSCOPY  02/20/2008   KGY:[id number] distal esophageal mucosa, suspicious for neoplasm/Hiatal hernia otherwise normal   . ESOPHAGOGASTRODUODENOSCOPY  04/03/2008   WYO:[id number] hiatal hernia, otherwise normal/Short, tight, benign-appearing peptic stricture  . ESOPHAGOGASTRODUODENOSCOPY  November 16, 2012   Dr. Britta Mccreedy: hiatal hernia, esophagitis,   . GIVENS CAPSULE STUDY N/A 12/05/2012   Few superficial erosions but nothing found to explain iron deficiency anemia.   . Ileocolonoscopy  06/05/2008     OIN:OMVEHMC anal canal, otherwise normal rectum, colon  . TONSILLECTOMY       Allergies  Allergen Reactions  . Penicillins Shortness Of Breath     Has patient had a PCN reaction causing immediate rash, facial/tongue/throat swelling, SOB or lightheadedness with hypotension: Yes Has patient had a PCN reaction causing severe rash involving mucus membranes or skin necrosis: Yes Has patient had a PCN reaction that required hospitalization No Has patient had a PCN reaction occurring within the last 10 years: No  If all of the above answers are "NO", then may proceed with Cephalosporin use.   . Sulfa Antibiotics Shortness Of Breath  . Aspirin Nausea And Vomiting  . Lovenox  [Enoxaparin Sodium] Other (See Comments)    Has been known to cause her blood clots in her legs  . Prednisone Other (See Comments)    Pancreatitis, but has to take for asthma sometimes      Family History  Problem Relation Age of Onset  . Asthma Other   . Coronary artery disease Neg Hx   . Colon cancer Neg Hx   . Asthma Paternal Grandfather      Social History [name] reports that she has been smoking Cigarettes.  She has been smoking about 1.00 pack per day for the past 0.00 years. She has quit using smokeless  tobacco. [name] reports that she does not drink alcohol.   Review of Systems CONSTITUTIONAL: No weight loss, fever, chills, weakness or fatigue.  HEENT: Eyes: No visual loss, blurred vision, double vision or yellow sclerae.No hearing loss, sneezing, congestion, runny nose or sore throat.  SKIN: No rash or itching.  CARDIOVASCULAR: per hpi RESPIRATORY: No shortness of breath, cough or sputum.  GASTROINTESTINAL: No anorexia, nausea, vomiting or diarrhea. No abdominal pain or blood.  GENITOURINARY: No burning on urination, no polyuria NEUROLOGICAL: No headache, dizziness, syncope, paralysis, ataxia, numbness or tingling in the extremities. No change in bowel or bladder control.  MUSCULOSKELETAL: No muscle, back pain,  joint pain or stiffness.  LYMPHATICS: No enlarged nodes. No history of splenectomy.  PSYCHIATRIC: No history of depression or anxiety.  ENDOCRINOLOGIC: No reports of sweating, cold or heat intolerance. No polyuria or polydipsia.  Marland Kitchen   Physical Examination Vitals:   12/10/16 0942  BP: 106/66  Pulse: (!) 104   Vitals:   12/10/16 0942  Weight: 146 lb (66.2 kg)  Height: 5\' 2"  (1.575 m)    Gen: resting comfortably, no acute distress HEENT: no scleral icterus, pupils equal round and reactive, no palptable cervical adenopathy,  CV: RRR, 3/6 systolic murmur at apex, no jvd Resp: Clear to auscultation bilaterally GI: abdomen is soft, non-tender, non-distended, normal bowel sounds, no hepatosplenomegaly MSK: extremities are warm, no edema.  Skin: warm, no rash Neuro:  no focal deficits Psych: appropriate affect    Assessment and Plan  1. Heart murmur - we will obtain echo to further evaluate  2. HTN - at goal, continue currnet meds  3. Abnormal EKG - EKG in clinic shows mild sinus tach, RBBB, LPFB with RAD - follow echo to evaluate for structurla heart disease  4. LE edema - start prn lasix 20mg . F/u echo results    Arnoldo Lenis, M.D.

## 2016-12-10 NOTE — Patient Instructions (Signed)
Medication Instructions:  START LASIX 20 MG DAILY AS NEEDED FOR SWELLING   Labwork: I WILL REQUEST A COPY OF YOUR LABS FROM PCP.   Testing/Procedures: Your physician has requested that you have an echocardiogram. Echocardiography is a painless test that uses sound waves to create images of your heart. It provides your doctor with information about the size and shape of your heart and how well your heart's chambers and valves are working. This procedure takes approximately one hour. There are no restrictions for this procedure.    Follow-Up: Your physician recommends that you schedule a follow-up appointment in: TO BE DETERMINED BASED ON TEST    Any Other Special Instructions Will Be Listed Below (If Applicable).     If you need a refill on your cardiac medications before your next appointment, please call your pharmacy.

## 2016-12-15 ENCOUNTER — Ambulatory Visit (HOSPITAL_COMMUNITY)
Admission: RE | Admit: 2016-12-15 | Discharge: 2016-12-15 | Disposition: A | Discharge status: Home / Self Care | Payer: Medicare Other | Source: Ambulatory Visit | Attending: Cardiology | Admitting: Cardiology

## 2016-12-15 DIAGNOSIS — R011 Cardiac murmur, unspecified: Secondary | ICD-10-CM | POA: Insufficient documentation

## 2016-12-15 LAB — ECHOCARDIOGRAM COMPLETE
E/e' ratio: 6.47
EWDT: 164 ms
FS: 39 % (ref 28–44)
IV/PV OW: 1.03
LA diam end sys: 26 mm
LADIAMINDEX: 1.56 cm/m2
LASIZE: 26 mm
LAVOL: 23.5 mL
LAVOLA4C: 21.9 mL
LAVOLIN: 14.1 mL/m2
LV E/e' medial: 6.47
LV E/e'average: 6.47
LV PW d: 9.41 mm — AB (ref 0.6–1.1)
LV e' LATERAL: 9.36 cm/s
MV Dec: 164
MV pk A vel: 64 m/s
MV pk E vel: 60.6 m/s
RV LATERAL S' VELOCITY: 7.93 cm/s
RV TAPSE: 18.3 mm
TDI e' lateral: 9.36
TDI e' medial: 9.03

## 2016-12-15 NOTE — Progress Notes (Signed)
*  PRELIMINARY RESULTS* Echocardiogram 2D Echocardiogram has been performed.  [name] 12/15/2016, 2:51 PM

## 2017-01-01 DIAGNOSIS — G43909 Migraine, unspecified, not intractable, without status migrainosus: Secondary | ICD-10-CM | POA: Diagnosis not present

## 2017-01-01 DIAGNOSIS — H6502 Acute serous otitis media, left ear: Secondary | ICD-10-CM | POA: Diagnosis not present

## 2017-01-01 DIAGNOSIS — M47812 Spondylosis without myelopathy or radiculopathy, cervical region: Secondary | ICD-10-CM | POA: Diagnosis not present

## 2017-01-01 DIAGNOSIS — Z6824 Body mass index (BMI) 24.0-24.9, adult: Secondary | ICD-10-CM | POA: Diagnosis not present

## 2017-01-01 DIAGNOSIS — E1140 Type 2 diabetes mellitus with diabetic neuropathy, unspecified: Secondary | ICD-10-CM | POA: Diagnosis not present

## 2017-01-01 DIAGNOSIS — Z1389 Encounter for screening for other disorder: Secondary | ICD-10-CM | POA: Diagnosis not present

## 2017-01-01 DIAGNOSIS — M47816 Spondylosis without myelopathy or radiculopathy, lumbar region: Secondary | ICD-10-CM | POA: Diagnosis not present

## 2017-01-01 DIAGNOSIS — E119 Type 2 diabetes mellitus without complications: Secondary | ICD-10-CM | POA: Diagnosis not present

## 2017-01-05 DIAGNOSIS — E119 Type 2 diabetes mellitus without complications: Secondary | ICD-10-CM | POA: Diagnosis not present

## 2017-01-05 DIAGNOSIS — E1140 Type 2 diabetes mellitus with diabetic neuropathy, unspecified: Secondary | ICD-10-CM | POA: Diagnosis not present

## 2017-01-08 ENCOUNTER — Ambulatory Visit (HOSPITAL_COMMUNITY)
Admission: RE | Admit: 2017-01-08 | Discharge: 2017-01-08 | Disposition: A | Discharge status: Home / Self Care | Payer: 59 | Attending: Psychiatry | Admitting: Psychiatry

## 2017-01-08 DIAGNOSIS — F419 Anxiety disorder, unspecified: Secondary | ICD-10-CM | POA: Diagnosis not present

## 2017-01-08 DIAGNOSIS — Z1389 Encounter for screening for other disorder: Secondary | ICD-10-CM | POA: Insufficient documentation

## 2017-01-08 DIAGNOSIS — F1129 Opioid dependence with unspecified opioid-induced disorder: Secondary | ICD-10-CM | POA: Insufficient documentation

## 2017-01-08 DIAGNOSIS — E119 Type 2 diabetes mellitus without complications: Secondary | ICD-10-CM | POA: Insufficient documentation

## 2017-01-08 DIAGNOSIS — J449 Chronic obstructive pulmonary disease, unspecified: Secondary | ICD-10-CM | POA: Diagnosis not present

## 2017-01-08 DIAGNOSIS — F319 Bipolar disorder, unspecified: Secondary | ICD-10-CM | POA: Diagnosis not present
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
## 2017-01-18 DIAGNOSIS — I10 Essential (primary) hypertension: Secondary | ICD-10-CM | POA: Diagnosis not present

## 2017-01-18 DIAGNOSIS — Z1389 Encounter for screening for other disorder: Secondary | ICD-10-CM | POA: Diagnosis not present

## 2017-01-18 DIAGNOSIS — T50905A Adverse effect of unspecified drugs, medicaments and biological substances, initial encounter: Secondary | ICD-10-CM | POA: Diagnosis not present

## 2017-01-18 DIAGNOSIS — G894 Chronic pain syndrome: Secondary | ICD-10-CM | POA: Diagnosis not present

## 2017-01-18 DIAGNOSIS — Z6823 Body mass index (BMI) 23.0-23.9, adult: Secondary | ICD-10-CM | POA: Diagnosis not present

## 2017-01-18 DIAGNOSIS — E063 Autoimmune thyroiditis: Secondary | ICD-10-CM | POA: Diagnosis not present

## 2017-01-25 DIAGNOSIS — H33321 Round hole, right eye: Secondary | ICD-10-CM | POA: Diagnosis not present

## 2017-01-25 DIAGNOSIS — D3132 Benign neoplasm of left choroid: Secondary | ICD-10-CM | POA: Diagnosis not present

## 2017-01-25 DIAGNOSIS — H43813 Vitreous degeneration, bilateral: Secondary | ICD-10-CM | POA: Diagnosis not present

## 2017-02-05 DIAGNOSIS — I10 Essential (primary) hypertension: Secondary | ICD-10-CM | POA: Diagnosis not present

## 2017-02-05 DIAGNOSIS — Z6824 Body mass index (BMI) 24.0-24.9, adult: Secondary | ICD-10-CM | POA: Diagnosis not present

## 2017-02-05 DIAGNOSIS — Z1389 Encounter for screening for other disorder: Secondary | ICD-10-CM | POA: Diagnosis not present

## 2017-02-05 DIAGNOSIS — J9610 Chronic respiratory failure, unspecified whether with hypoxia or hypercapnia: Secondary | ICD-10-CM | POA: Diagnosis not present

## 2017-02-05 DIAGNOSIS — R0902 Hypoxemia: Secondary | ICD-10-CM | POA: Diagnosis not present

## 2017-02-05 DIAGNOSIS — E1142 Type 2 diabetes mellitus with diabetic polyneuropathy: Secondary | ICD-10-CM | POA: Diagnosis not present

## 2017-02-17 DIAGNOSIS — J449 Chronic obstructive pulmonary disease, unspecified: Secondary | ICD-10-CM | POA: Diagnosis not present

## 2017-02-24 HISTORY — PX: OTHER SURGICAL HISTORY: SHX169

## 2017-02-25 DIAGNOSIS — Z1231 Encounter for screening mammogram for malignant neoplasm of breast: Secondary | ICD-10-CM | POA: Diagnosis not present

## 2017-03-05 DIAGNOSIS — K621 Rectal polyp: Secondary | ICD-10-CM | POA: Diagnosis not present

## 2017-03-05 DIAGNOSIS — J449 Chronic obstructive pulmonary disease, unspecified: Secondary | ICD-10-CM | POA: Diagnosis not present

## 2017-03-05 DIAGNOSIS — G8929 Other chronic pain: Secondary | ICD-10-CM | POA: Diagnosis not present

## 2017-03-05 DIAGNOSIS — K625 Hemorrhage of anus and rectum: Secondary | ICD-10-CM | POA: Diagnosis not present

## 2017-03-05 DIAGNOSIS — K861 Other chronic pancreatitis: Secondary | ICD-10-CM | POA: Diagnosis not present

## 2017-03-10 DIAGNOSIS — Z124 Encounter for screening for malignant neoplasm of cervix: Secondary | ICD-10-CM | POA: Diagnosis not present

## 2017-03-10 DIAGNOSIS — Z0000 Encounter for general adult medical examination without abnormal findings: Secondary | ICD-10-CM | POA: Diagnosis not present

## 2017-03-11 DIAGNOSIS — M79672 Pain in left foot: Secondary | ICD-10-CM | POA: Diagnosis not present

## 2017-03-11 DIAGNOSIS — M2042 Other hammer toe(s) (acquired), left foot: Secondary | ICD-10-CM | POA: Diagnosis not present

## 2017-03-11 DIAGNOSIS — G5762 Lesion of plantar nerve, left lower limb: Secondary | ICD-10-CM | POA: Diagnosis not present

## 2017-03-20 DIAGNOSIS — J449 Chronic obstructive pulmonary disease, unspecified: Secondary | ICD-10-CM | POA: Diagnosis not present

## 2017-04-06 DIAGNOSIS — K7689 Other specified diseases of liver: Secondary | ICD-10-CM | POA: Diagnosis not present

## 2017-04-06 DIAGNOSIS — K5900 Constipation, unspecified: Secondary | ICD-10-CM | POA: Diagnosis not present

## 2017-04-06 DIAGNOSIS — R509 Fever, unspecified: Secondary | ICD-10-CM | POA: Diagnosis not present

## 2017-04-06 DIAGNOSIS — Z825 Family history of asthma and other chronic lower respiratory diseases: Secondary | ICD-10-CM | POA: Diagnosis not present

## 2017-04-06 DIAGNOSIS — R0789 Other chest pain: Secondary | ICD-10-CM | POA: Diagnosis not present

## 2017-04-06 DIAGNOSIS — I482 Chronic atrial fibrillation: Secondary | ICD-10-CM | POA: Diagnosis not present

## 2017-04-06 DIAGNOSIS — Z7984 Long term (current) use of oral hypoglycemic drugs: Secondary | ICD-10-CM | POA: Diagnosis not present

## 2017-04-06 DIAGNOSIS — K449 Diaphragmatic hernia without obstruction or gangrene: Secondary | ICD-10-CM | POA: Diagnosis not present

## 2017-04-06 DIAGNOSIS — A419 Sepsis, unspecified organism: Secondary | ICD-10-CM | POA: Diagnosis not present

## 2017-04-06 DIAGNOSIS — K219 Gastro-esophageal reflux disease without esophagitis: Secondary | ICD-10-CM | POA: Diagnosis not present

## 2017-04-06 DIAGNOSIS — R52 Pain, unspecified: Secondary | ICD-10-CM | POA: Diagnosis not present

## 2017-04-06 DIAGNOSIS — Z880 Allergy status to penicillin: Secondary | ICD-10-CM | POA: Diagnosis not present

## 2017-04-06 DIAGNOSIS — J189 Pneumonia, unspecified organism: Secondary | ICD-10-CM | POA: Diagnosis not present

## 2017-04-06 DIAGNOSIS — R1031 Right lower quadrant pain: Secondary | ICD-10-CM | POA: Diagnosis not present

## 2017-04-06 DIAGNOSIS — E119 Type 2 diabetes mellitus without complications: Secondary | ICD-10-CM | POA: Diagnosis not present

## 2017-04-06 DIAGNOSIS — Z79899 Other long term (current) drug therapy: Secondary | ICD-10-CM | POA: Diagnosis not present

## 2017-04-06 DIAGNOSIS — J690 Pneumonitis due to inhalation of food and vomit: Secondary | ICD-10-CM | POA: Diagnosis not present

## 2017-04-06 DIAGNOSIS — R112 Nausea with vomiting, unspecified: Secondary | ICD-10-CM | POA: Diagnosis not present

## 2017-04-06 DIAGNOSIS — J440 Chronic obstructive pulmonary disease with acute lower respiratory infection: Secondary | ICD-10-CM | POA: Diagnosis not present

## 2017-04-06 DIAGNOSIS — Z881 Allergy status to other antibiotic agents status: Secondary | ICD-10-CM | POA: Diagnosis not present

## 2017-04-06 DIAGNOSIS — Z87891 Personal history of nicotine dependence: Secondary | ICD-10-CM | POA: Diagnosis not present

## 2017-04-06 DIAGNOSIS — J181 Lobar pneumonia, unspecified organism: Secondary | ICD-10-CM | POA: Diagnosis not present

## 2017-04-06 DIAGNOSIS — Z7951 Long term (current) use of inhaled steroids: Secondary | ICD-10-CM | POA: Diagnosis not present

## 2017-04-06 DIAGNOSIS — Z8249 Family history of ischemic heart disease and other diseases of the circulatory system: Secondary | ICD-10-CM | POA: Diagnosis not present

## 2017-04-06 DIAGNOSIS — K861 Other chronic pancreatitis: Secondary | ICD-10-CM | POA: Diagnosis not present

## 2017-04-06 DIAGNOSIS — Z888 Allergy status to other drugs, medicaments and biological substances status: Secondary | ICD-10-CM | POA: Diagnosis not present

## 2017-04-06 DIAGNOSIS — Z9981 Dependence on supplemental oxygen: Secondary | ICD-10-CM | POA: Diagnosis not present

## 2017-04-12 DIAGNOSIS — J189 Pneumonia, unspecified organism: Secondary | ICD-10-CM | POA: Diagnosis not present

## 2017-04-12 DIAGNOSIS — G894 Chronic pain syndrome: Secondary | ICD-10-CM | POA: Diagnosis not present

## 2017-04-12 DIAGNOSIS — L84 Corns and callosities: Secondary | ICD-10-CM | POA: Diagnosis not present

## 2017-04-12 DIAGNOSIS — Z6823 Body mass index (BMI) 23.0-23.9, adult: Secondary | ICD-10-CM | POA: Diagnosis not present

## 2017-04-12 DIAGNOSIS — K219 Gastro-esophageal reflux disease without esophagitis: Secondary | ICD-10-CM | POA: Diagnosis not present

## 2017-04-12 DIAGNOSIS — K449 Diaphragmatic hernia without obstruction or gangrene: Secondary | ICD-10-CM | POA: Diagnosis not present

## 2017-04-12 DIAGNOSIS — R201 Hypoesthesia of skin: Secondary | ICD-10-CM | POA: Diagnosis not present

## 2017-04-12 DIAGNOSIS — J9811 Atelectasis: Secondary | ICD-10-CM | POA: Diagnosis not present

## 2017-04-12 DIAGNOSIS — E1140 Type 2 diabetes mellitus with diabetic neuropathy, unspecified: Secondary | ICD-10-CM | POA: Diagnosis not present

## 2017-04-12 DIAGNOSIS — M4854XD Collapsed vertebra, not elsewhere classified, thoracic region, subsequent encounter for fracture with routine healing: Secondary | ICD-10-CM | POA: Diagnosis not present

## 2017-04-20 DIAGNOSIS — J449 Chronic obstructive pulmonary disease, unspecified: Secondary | ICD-10-CM | POA: Diagnosis not present

## 2017-04-28 DIAGNOSIS — Z79899 Other long term (current) drug therapy: Secondary | ICD-10-CM | POA: Diagnosis not present

## 2017-04-28 DIAGNOSIS — Z23 Encounter for immunization: Secondary | ICD-10-CM | POA: Diagnosis not present

## 2017-04-28 DIAGNOSIS — K219 Gastro-esophageal reflux disease without esophagitis: Secondary | ICD-10-CM | POA: Diagnosis not present

## 2017-04-28 DIAGNOSIS — Z880 Allergy status to penicillin: Secondary | ICD-10-CM | POA: Diagnosis not present

## 2017-04-28 DIAGNOSIS — S9032XA Contusion of left foot, initial encounter: Secondary | ICD-10-CM | POA: Diagnosis not present

## 2017-04-28 DIAGNOSIS — K861 Other chronic pancreatitis: Secondary | ICD-10-CM | POA: Diagnosis not present

## 2017-04-28 DIAGNOSIS — Z87891 Personal history of nicotine dependence: Secondary | ICD-10-CM | POA: Diagnosis not present

## 2017-04-28 DIAGNOSIS — G894 Chronic pain syndrome: Secondary | ICD-10-CM | POA: Diagnosis not present

## 2017-04-28 DIAGNOSIS — R0602 Shortness of breath: Secondary | ICD-10-CM | POA: Diagnosis not present

## 2017-04-28 DIAGNOSIS — J449 Chronic obstructive pulmonary disease, unspecified: Secondary | ICD-10-CM | POA: Diagnosis not present

## 2017-04-28 DIAGNOSIS — Z7951 Long term (current) use of inhaled steroids: Secondary | ICD-10-CM | POA: Diagnosis not present

## 2017-04-28 DIAGNOSIS — Z888 Allergy status to other drugs, medicaments and biological substances status: Secondary | ICD-10-CM | POA: Diagnosis not present

## 2017-04-28 DIAGNOSIS — I10 Essential (primary) hypertension: Secondary | ICD-10-CM | POA: Diagnosis not present

## 2017-04-28 DIAGNOSIS — Z881 Allergy status to other antibiotic agents status: Secondary | ICD-10-CM | POA: Diagnosis not present

## 2017-04-28 DIAGNOSIS — J441 Chronic obstructive pulmonary disease with (acute) exacerbation: Secondary | ICD-10-CM | POA: Diagnosis not present

## 2017-04-28 DIAGNOSIS — R0902 Hypoxemia: Secondary | ICD-10-CM | POA: Diagnosis not present

## 2017-04-28 DIAGNOSIS — E119 Type 2 diabetes mellitus without complications: Secondary | ICD-10-CM | POA: Diagnosis not present

## 2017-04-28 DIAGNOSIS — Z7984 Long term (current) use of oral hypoglycemic drugs: Secondary | ICD-10-CM | POA: Diagnosis not present

## 2017-04-28 DIAGNOSIS — Z720 Tobacco use: Secondary | ICD-10-CM | POA: Diagnosis not present

## 2017-04-28 DIAGNOSIS — S99922A Unspecified injury of left foot, initial encounter: Secondary | ICD-10-CM | POA: Diagnosis not present

## 2017-04-28 DIAGNOSIS — Z9981 Dependence on supplemental oxygen: Secondary | ICD-10-CM | POA: Diagnosis not present

## 2017-04-28 DIAGNOSIS — J189 Pneumonia, unspecified organism: Secondary | ICD-10-CM | POA: Diagnosis not present

## 2017-04-30 DIAGNOSIS — Z23 Encounter for immunization: Secondary | ICD-10-CM | POA: Diagnosis not present

## 2017-05-20 ENCOUNTER — Other Ambulatory Visit (HOSPITAL_COMMUNITY): Payer: Self-pay | Admitting: Physician Assistant

## 2017-05-20 ENCOUNTER — Ambulatory Visit (HOSPITAL_COMMUNITY)
Admission: RE | Admit: 2017-05-20 | Discharge: 2017-05-20 | Disposition: A | Discharge status: Home / Self Care | Payer: Medicare Other | Source: Ambulatory Visit | Attending: Physician Assistant | Admitting: Physician Assistant

## 2017-05-20 DIAGNOSIS — R0789 Other chest pain: Secondary | ICD-10-CM | POA: Diagnosis not present

## 2017-05-20 DIAGNOSIS — Z6824 Body mass index (BMI) 24.0-24.9, adult: Secondary | ICD-10-CM | POA: Diagnosis not present

## 2017-05-20 DIAGNOSIS — J189 Pneumonia, unspecified organism: Secondary | ICD-10-CM | POA: Diagnosis not present

## 2017-05-20 DIAGNOSIS — K861 Other chronic pancreatitis: Secondary | ICD-10-CM | POA: Diagnosis not present

## 2017-05-20 DIAGNOSIS — J449 Chronic obstructive pulmonary disease, unspecified: Secondary | ICD-10-CM | POA: Diagnosis not present

## 2017-05-31 DIAGNOSIS — J441 Chronic obstructive pulmonary disease with (acute) exacerbation: Secondary | ICD-10-CM | POA: Diagnosis not present

## 2017-06-20 DIAGNOSIS — J449 Chronic obstructive pulmonary disease, unspecified: Secondary | ICD-10-CM | POA: Diagnosis not present

## 2017-06-30 DIAGNOSIS — J441 Chronic obstructive pulmonary disease with (acute) exacerbation: Secondary | ICD-10-CM | POA: Diagnosis not present

## 2017-06-30 DIAGNOSIS — E1140 Type 2 diabetes mellitus with diabetic neuropathy, unspecified: Secondary | ICD-10-CM | POA: Diagnosis not present

## 2017-07-08 DIAGNOSIS — G894 Chronic pain syndrome: Secondary | ICD-10-CM | POA: Diagnosis not present

## 2017-07-08 DIAGNOSIS — J449 Chronic obstructive pulmonary disease, unspecified: Secondary | ICD-10-CM | POA: Diagnosis not present

## 2017-07-08 DIAGNOSIS — E119 Type 2 diabetes mellitus without complications: Secondary | ICD-10-CM | POA: Diagnosis not present

## 2017-07-08 DIAGNOSIS — Z6824 Body mass index (BMI) 24.0-24.9, adult: Secondary | ICD-10-CM | POA: Diagnosis not present

## 2017-07-08 DIAGNOSIS — I10 Essential (primary) hypertension: Secondary | ICD-10-CM | POA: Diagnosis not present

## 2017-07-20 DIAGNOSIS — J449 Chronic obstructive pulmonary disease, unspecified: Secondary | ICD-10-CM | POA: Diagnosis not present

## 2017-07-22 DIAGNOSIS — K861 Other chronic pancreatitis: Secondary | ICD-10-CM | POA: Diagnosis not present

## 2017-07-22 DIAGNOSIS — Z880 Allergy status to penicillin: Secondary | ICD-10-CM | POA: Diagnosis not present

## 2017-07-22 DIAGNOSIS — Z9989 Dependence on other enabling machines and devices: Secondary | ICD-10-CM | POA: Diagnosis not present

## 2017-07-22 DIAGNOSIS — Z7984 Long term (current) use of oral hypoglycemic drugs: Secondary | ICD-10-CM | POA: Diagnosis not present

## 2017-07-22 DIAGNOSIS — Z79899 Other long term (current) drug therapy: Secondary | ICD-10-CM | POA: Diagnosis not present

## 2017-07-22 DIAGNOSIS — J449 Chronic obstructive pulmonary disease, unspecified: Secondary | ICD-10-CM | POA: Diagnosis not present

## 2017-07-22 DIAGNOSIS — E039 Hypothyroidism, unspecified: Secondary | ICD-10-CM | POA: Diagnosis not present

## 2017-07-22 DIAGNOSIS — K219 Gastro-esophageal reflux disease without esophagitis: Secondary | ICD-10-CM | POA: Diagnosis not present

## 2017-07-22 DIAGNOSIS — E119 Type 2 diabetes mellitus without complications: Secondary | ICD-10-CM | POA: Diagnosis not present

## 2017-07-22 DIAGNOSIS — I10 Essential (primary) hypertension: Secondary | ICD-10-CM | POA: Diagnosis not present

## 2017-07-22 DIAGNOSIS — Z882 Allergy status to sulfonamides status: Secondary | ICD-10-CM | POA: Diagnosis not present

## 2017-07-31 DIAGNOSIS — J441 Chronic obstructive pulmonary disease with (acute) exacerbation: Secondary | ICD-10-CM | POA: Diagnosis not present

## 2017-08-16 DIAGNOSIS — J9610 Chronic respiratory failure, unspecified whether with hypoxia or hypercapnia: Secondary | ICD-10-CM | POA: Diagnosis not present

## 2017-08-16 DIAGNOSIS — J449 Chronic obstructive pulmonary disease, unspecified: Secondary | ICD-10-CM | POA: Diagnosis not present

## 2017-08-20 DIAGNOSIS — J449 Chronic obstructive pulmonary disease, unspecified: Secondary | ICD-10-CM | POA: Diagnosis not present

## 2017-08-30 DIAGNOSIS — Z6823 Body mass index (BMI) 23.0-23.9, adult: Secondary | ICD-10-CM | POA: Diagnosis not present

## 2017-08-30 DIAGNOSIS — J449 Chronic obstructive pulmonary disease, unspecified: Secondary | ICD-10-CM | POA: Diagnosis not present

## 2017-08-30 DIAGNOSIS — G894 Chronic pain syndrome: Secondary | ICD-10-CM | POA: Diagnosis not present

## 2017-08-30 DIAGNOSIS — J439 Emphysema, unspecified: Secondary | ICD-10-CM | POA: Diagnosis not present

## 2017-08-30 DIAGNOSIS — M76822 Posterior tibial tendinitis, left leg: Secondary | ICD-10-CM | POA: Diagnosis not present

## 2017-09-06 DIAGNOSIS — G5793 Unspecified mononeuropathy of bilateral lower limbs: Secondary | ICD-10-CM | POA: Diagnosis not present

## 2017-09-06 DIAGNOSIS — M79672 Pain in left foot: Secondary | ICD-10-CM | POA: Diagnosis not present

## 2017-09-07 DIAGNOSIS — R400 Somnolence: Secondary | ICD-10-CM | POA: Diagnosis not present

## 2017-09-09 ENCOUNTER — Other Ambulatory Visit: Payer: Self-pay | Admitting: *Deleted

## 2017-09-09 ENCOUNTER — Encounter: Payer: Self-pay | Admitting: Neurology

## 2017-09-09 DIAGNOSIS — R200 Anesthesia of skin: Secondary | ICD-10-CM

## 2017-09-16 DIAGNOSIS — J9610 Chronic respiratory failure, unspecified whether with hypoxia or hypercapnia: Secondary | ICD-10-CM | POA: Diagnosis not present

## 2017-09-16 DIAGNOSIS — J449 Chronic obstructive pulmonary disease, unspecified: Secondary | ICD-10-CM | POA: Diagnosis not present

## 2017-09-18 DIAGNOSIS — K219 Gastro-esophageal reflux disease without esophagitis: Secondary | ICD-10-CM | POA: Diagnosis not present

## 2017-09-18 DIAGNOSIS — Z9981 Dependence on supplemental oxygen: Secondary | ICD-10-CM | POA: Diagnosis not present

## 2017-09-18 DIAGNOSIS — R0602 Shortness of breath: Secondary | ICD-10-CM | POA: Diagnosis not present

## 2017-09-18 DIAGNOSIS — J9601 Acute respiratory failure with hypoxia: Secondary | ICD-10-CM | POA: Diagnosis not present

## 2017-09-18 DIAGNOSIS — E876 Hypokalemia: Secondary | ICD-10-CM | POA: Diagnosis not present

## 2017-09-18 DIAGNOSIS — J449 Chronic obstructive pulmonary disease, unspecified: Secondary | ICD-10-CM | POA: Diagnosis not present

## 2017-09-18 DIAGNOSIS — Z87891 Personal history of nicotine dependence: Secondary | ICD-10-CM | POA: Diagnosis not present

## 2017-09-18 DIAGNOSIS — J441 Chronic obstructive pulmonary disease with (acute) exacerbation: Secondary | ICD-10-CM | POA: Diagnosis not present

## 2017-09-18 DIAGNOSIS — Z79899 Other long term (current) drug therapy: Secondary | ICD-10-CM | POA: Diagnosis not present

## 2017-09-18 DIAGNOSIS — J440 Chronic obstructive pulmonary disease with acute lower respiratory infection: Secondary | ICD-10-CM | POA: Diagnosis not present

## 2017-09-18 DIAGNOSIS — J209 Acute bronchitis, unspecified: Secondary | ICD-10-CM | POA: Diagnosis not present

## 2017-09-18 DIAGNOSIS — I10 Essential (primary) hypertension: Secondary | ICD-10-CM | POA: Diagnosis not present

## 2017-09-18 DIAGNOSIS — R0902 Hypoxemia: Secondary | ICD-10-CM | POA: Diagnosis not present

## 2017-09-18 DIAGNOSIS — K861 Other chronic pancreatitis: Secondary | ICD-10-CM | POA: Diagnosis not present

## 2017-09-18 DIAGNOSIS — E119 Type 2 diabetes mellitus without complications: Secondary | ICD-10-CM | POA: Diagnosis not present

## 2017-09-18 DIAGNOSIS — J4541 Moderate persistent asthma with (acute) exacerbation: Secondary | ICD-10-CM | POA: Diagnosis not present

## 2017-09-22 DIAGNOSIS — G4730 Sleep apnea, unspecified: Secondary | ICD-10-CM | POA: Diagnosis not present

## 2017-09-28 ENCOUNTER — Ambulatory Visit (INDEPENDENT_AMBULATORY_CARE_PROVIDER_SITE_OTHER): Payer: Medicare Other | Admitting: Neurology

## 2017-09-28 DIAGNOSIS — E1142 Type 2 diabetes mellitus with diabetic polyneuropathy: Secondary | ICD-10-CM

## 2017-09-28 DIAGNOSIS — R200 Anesthesia of skin: Secondary | ICD-10-CM

## 2017-09-28 NOTE — Procedures (Signed)
[facility]  [address], [place]  [place], [place] [address] Tel: [phone] Fax:  [phone] Test Date:  09/28/2017  Patient: [name] DOB: 26-Aug-1970 Physician: Narda Amber, DO  Sex: Female Height: 5\' 2"  Ref Phys: Brett Albino  ID#: [id number] Temp: 32.6 Technician:    Patient Complaints: This is a 47 year-old female with diabetes mellitus referred for evaluation of left foot pain.  NCV & EMG Findings: Extensive electrodiagnostic testing of the left lower extremity and additional studies of the right shows:  1. Bilateral sural and superficial peroneal sensory responses are absent. 2. Bilateral tibial and right peroneal (EDB) motor responses are absent. Left peroneal motor response shows reduced amplitude at the extensor digitorum brevis. Bilateral peroneal motor responses at the tibialis anterior are within normal limits. 3. Left tibial motor responses within normal limits. 4. There is no evidence of active or chronic motor axon loss changes affecting any of the tested muscles. Motor unit configuration and recruitment pattern is within normal limits.  Impression: The electrophysiologic findings are most consistent with a distal and symmetric sensorimotor polyneuropathy, axon loss in type, affecting the lower extremities.    ___________________________ Narda Amber, DO    Nerve Conduction Studies Anti Sensory Summary Table   Stim Site NR Peak (ms) Norm Peak (ms) P-T Amp (V) Norm P-T Amp  Left Sup Peroneal Anti Sensory (Ant Lat Mall)  12 cm NR  <4.5  >5  Right Sup Peroneal Anti Sensory (Ant Lat Mall)  12 cm NR  <4.5  >5  Left Sural Anti Sensory (Lat Mall)  Calf NR  <4.5  >5  Right Sural Anti Sensory (Lat Mall)  Calf NR  <4.5  >5   Motor Summary Table   Stim Site NR Onset (ms) Norm Onset (ms) O-P Amp (mV) Norm O-P Amp Site1 Site2 Delta-0 (ms) Dist (cm) Vel (m/s) Norm Vel (m/s)  Left Peroneal Motor (Ext Dig Brev)  Ankle    3.6 <5.5  2.2 >3 B Fib Ankle 7.2 34.0 47 >40  B Fib    10.8  1.8  Poplt B Fib 1.7 8.0 47 >40  Poplt    12.5  1.8         Right Peroneal Motor (Ext Dig Brev)  Ankle NR  <5.5  >3 B Fib Ankle  0.0  >40  B Fib NR     Poplt B Fib  0.0  >40  Poplt NR            Left Peroneal TA Motor (Tib Ant)  Fib Head    3.2 <4.0 4.3 >4 Poplit Fib Head 0.9 8.0 89 >40  Poplit    4.1  4.0         Right Peroneal TA Motor (Tib Ant)  Fib Head    3.0 <4.0 4.4 >4 Poplit Fib Head 0.9 8.0 89 >40  Poplit    3.9  3.9         Left Tibial Motor (Abd Hall Brev)  Ankle NR  <6.0  >8 Knee Ankle  0.0  >40  Knee NR            Right Tibial Motor (Abd Hall Brev)  Ankle NR  <6.0  >8 Knee Ankle  0.0  >40  Knee NR             H Reflex Studies   NR H-Lat (ms) Lat Norm (ms) L-R H-Lat (ms)  Left Tibial (Gastroc)     29.25 <35  EMG   Side Muscle Ins Act Fibs Psw Fasc Number Recrt Dur Dur. Amp Amp. Poly Poly. Comment  Left Gastroc Nml Nml Nml Nml Nml Nml Nml Nml Nml Nml Nml Nml N/A  Left AntTibialis Nml Nml Nml Nml Nml Nml Nml Nml Nml Nml Nml Nml N/A  Left Flex Dig Long Nml Nml Nml Nml Nml Nml Nml Nml Nml Nml Nml Nml N/A  Left RectFemoris Nml Nml Nml Nml Nml Nml Nml Nml Nml Nml Nml Nml N/A  Left GluteusMed Nml Nml Nml Nml Nml Nml Nml Nml Nml Nml Nml Nml N/A      Waveforms:

## 2017-10-04 DIAGNOSIS — G629 Polyneuropathy, unspecified: Secondary | ICD-10-CM | POA: Diagnosis not present

## 2017-10-14 DIAGNOSIS — J9610 Chronic respiratory failure, unspecified whether with hypoxia or hypercapnia: Secondary | ICD-10-CM | POA: Diagnosis not present

## 2017-10-14 DIAGNOSIS — J449 Chronic obstructive pulmonary disease, unspecified: Secondary | ICD-10-CM | POA: Diagnosis not present

## 2017-10-18 DIAGNOSIS — I10 Essential (primary) hypertension: Secondary | ICD-10-CM | POA: Diagnosis not present

## 2017-10-18 DIAGNOSIS — R109 Unspecified abdominal pain: Secondary | ICD-10-CM | POA: Diagnosis not present

## 2017-10-18 DIAGNOSIS — K861 Other chronic pancreatitis: Secondary | ICD-10-CM | POA: Diagnosis not present

## 2017-10-18 DIAGNOSIS — Z1389 Encounter for screening for other disorder: Secondary | ICD-10-CM | POA: Diagnosis not present

## 2017-10-18 DIAGNOSIS — E1165 Type 2 diabetes mellitus with hyperglycemia: Secondary | ICD-10-CM | POA: Diagnosis not present

## 2017-10-18 DIAGNOSIS — J449 Chronic obstructive pulmonary disease, unspecified: Secondary | ICD-10-CM | POA: Diagnosis not present

## 2017-10-18 DIAGNOSIS — K219 Gastro-esophageal reflux disease without esophagitis: Secondary | ICD-10-CM | POA: Diagnosis not present

## 2017-10-18 DIAGNOSIS — Z6824 Body mass index (BMI) 24.0-24.9, adult: Secondary | ICD-10-CM | POA: Diagnosis not present

## 2017-10-19 ENCOUNTER — Encounter: Payer: Self-pay | Admitting: Neurology

## 2017-10-21 DIAGNOSIS — K861 Other chronic pancreatitis: Secondary | ICD-10-CM | POA: Diagnosis not present

## 2017-10-27 DIAGNOSIS — K5900 Constipation, unspecified: Secondary | ICD-10-CM | POA: Diagnosis not present

## 2017-10-27 DIAGNOSIS — E119 Type 2 diabetes mellitus without complications: Secondary | ICD-10-CM | POA: Diagnosis not present

## 2017-10-27 DIAGNOSIS — R079 Chest pain, unspecified: Secondary | ICD-10-CM | POA: Diagnosis not present

## 2017-10-27 DIAGNOSIS — R109 Unspecified abdominal pain: Secondary | ICD-10-CM | POA: Diagnosis not present

## 2017-10-27 DIAGNOSIS — K861 Other chronic pancreatitis: Secondary | ICD-10-CM | POA: Diagnosis not present

## 2017-10-27 DIAGNOSIS — D72829 Elevated white blood cell count, unspecified: Secondary | ICD-10-CM | POA: Diagnosis not present

## 2017-10-27 DIAGNOSIS — R1010 Upper abdominal pain, unspecified: Secondary | ICD-10-CM | POA: Diagnosis not present

## 2017-10-27 DIAGNOSIS — N179 Acute kidney failure, unspecified: Secondary | ICD-10-CM | POA: Diagnosis not present

## 2017-10-27 DIAGNOSIS — Z9981 Dependence on supplemental oxygen: Secondary | ICD-10-CM | POA: Diagnosis not present

## 2017-10-27 DIAGNOSIS — Z880 Allergy status to penicillin: Secondary | ICD-10-CM | POA: Diagnosis not present

## 2017-10-27 DIAGNOSIS — I959 Hypotension, unspecified: Secondary | ICD-10-CM | POA: Diagnosis not present

## 2017-10-27 DIAGNOSIS — Z888 Allergy status to other drugs, medicaments and biological substances status: Secondary | ICD-10-CM | POA: Diagnosis not present

## 2017-10-27 DIAGNOSIS — I2510 Atherosclerotic heart disease of native coronary artery without angina pectoris: Secondary | ICD-10-CM | POA: Diagnosis not present

## 2017-10-27 DIAGNOSIS — Z79899 Other long term (current) drug therapy: Secondary | ICD-10-CM | POA: Diagnosis not present

## 2017-10-27 DIAGNOSIS — Z881 Allergy status to other antibiotic agents status: Secondary | ICD-10-CM | POA: Diagnosis not present

## 2017-10-27 DIAGNOSIS — I10 Essential (primary) hypertension: Secondary | ICD-10-CM | POA: Diagnosis not present

## 2017-10-27 DIAGNOSIS — Z7984 Long term (current) use of oral hypoglycemic drugs: Secondary | ICD-10-CM | POA: Diagnosis not present

## 2017-10-27 DIAGNOSIS — J449 Chronic obstructive pulmonary disease, unspecified: Secondary | ICD-10-CM | POA: Diagnosis not present

## 2017-10-27 DIAGNOSIS — E860 Dehydration: Secondary | ICD-10-CM | POA: Diagnosis not present

## 2017-10-27 DIAGNOSIS — Z79891 Long term (current) use of opiate analgesic: Secondary | ICD-10-CM | POA: Diagnosis not present

## 2017-10-27 DIAGNOSIS — G894 Chronic pain syndrome: Secondary | ICD-10-CM | POA: Diagnosis not present

## 2017-10-27 DIAGNOSIS — R1013 Epigastric pain: Secondary | ICD-10-CM | POA: Diagnosis not present

## 2017-10-28 DIAGNOSIS — R1010 Upper abdominal pain, unspecified: Secondary | ICD-10-CM | POA: Diagnosis not present

## 2017-10-28 DIAGNOSIS — Z881 Allergy status to other antibiotic agents status: Secondary | ICD-10-CM | POA: Diagnosis not present

## 2017-10-28 DIAGNOSIS — D72829 Elevated white blood cell count, unspecified: Secondary | ICD-10-CM | POA: Diagnosis not present

## 2017-10-28 DIAGNOSIS — G894 Chronic pain syndrome: Secondary | ICD-10-CM | POA: Diagnosis not present

## 2017-10-28 DIAGNOSIS — Z888 Allergy status to other drugs, medicaments and biological substances status: Secondary | ICD-10-CM | POA: Diagnosis not present

## 2017-10-28 DIAGNOSIS — E119 Type 2 diabetes mellitus without complications: Secondary | ICD-10-CM | POA: Diagnosis not present

## 2017-10-28 DIAGNOSIS — K861 Other chronic pancreatitis: Secondary | ICD-10-CM | POA: Diagnosis not present

## 2017-10-28 DIAGNOSIS — Z7984 Long term (current) use of oral hypoglycemic drugs: Secondary | ICD-10-CM | POA: Diagnosis not present

## 2017-10-28 DIAGNOSIS — I10 Essential (primary) hypertension: Secondary | ICD-10-CM | POA: Diagnosis not present

## 2017-10-28 DIAGNOSIS — Z79891 Long term (current) use of opiate analgesic: Secondary | ICD-10-CM | POA: Diagnosis not present

## 2017-10-28 DIAGNOSIS — Z880 Allergy status to penicillin: Secondary | ICD-10-CM | POA: Diagnosis not present

## 2017-10-28 DIAGNOSIS — J449 Chronic obstructive pulmonary disease, unspecified: Secondary | ICD-10-CM | POA: Diagnosis not present

## 2017-10-28 DIAGNOSIS — E860 Dehydration: Secondary | ICD-10-CM | POA: Diagnosis not present

## 2017-10-28 DIAGNOSIS — Z9981 Dependence on supplemental oxygen: Secondary | ICD-10-CM | POA: Diagnosis not present

## 2017-10-28 DIAGNOSIS — I2510 Atherosclerotic heart disease of native coronary artery without angina pectoris: Secondary | ICD-10-CM | POA: Diagnosis not present

## 2017-10-28 DIAGNOSIS — I959 Hypotension, unspecified: Secondary | ICD-10-CM | POA: Diagnosis not present

## 2017-10-28 DIAGNOSIS — Z79899 Other long term (current) drug therapy: Secondary | ICD-10-CM | POA: Diagnosis not present

## 2017-10-29 ENCOUNTER — Encounter: Payer: Self-pay | Admitting: Neurology

## 2017-10-29 ENCOUNTER — Ambulatory Visit: Payer: Medicare Other | Admitting: Neurology

## 2017-10-29 ENCOUNTER — Ambulatory Visit (INDEPENDENT_AMBULATORY_CARE_PROVIDER_SITE_OTHER): Payer: Medicare Other | Admitting: Neurology

## 2017-10-29 VITALS — BP 129/83 | HR 105 | Ht 63.0 in | Wt 139.0 lb

## 2017-10-29 DIAGNOSIS — E1140 Type 2 diabetes mellitus with diabetic neuropathy, unspecified: Secondary | ICD-10-CM

## 2017-10-29 DIAGNOSIS — G8929 Other chronic pain: Secondary | ICD-10-CM | POA: Diagnosis not present

## 2017-10-29 DIAGNOSIS — E1142 Type 2 diabetes mellitus with diabetic polyneuropathy: Secondary | ICD-10-CM

## 2017-10-29 DIAGNOSIS — M5442 Lumbago with sciatica, left side: Secondary | ICD-10-CM | POA: Diagnosis not present

## 2017-10-29 DIAGNOSIS — M4804 Spinal stenosis, thoracic region: Secondary | ICD-10-CM

## 2017-10-29 HISTORY — DX: Type 2 diabetes mellitus with diabetic neuropathy, unspecified: E11.40

## 2017-10-29 MED ORDER — METHOCARBAMOL 500 MG PO TABS: 500.0000 mg | ORAL_TABLET | Freq: Two times a day (BID) | ORAL | Status: DC

## 2017-10-29 NOTE — Progress Notes (Signed)
Reason for visit: Left leg pain  Referring physician: Dr. Darl Householder P Haseley is a 47 y.o. female  History of present illness:  Ms. Reggio is a 47 year old right-handed white female with a history of diabetes over the last 6 or 7 years.  The patient has had some neck and back discomfort over the years, and in 2014 she had a total myelogram with CT to follow.  The lumbar spine appeared to be completely normal at that time, but there was evidence of a T12 compression fracture and evidence of disc protrusion at the T6-7 level and the T9-10 level, at the upper level the AP diameter was 8.5 mm with posterior displacement of the spinal cord.  Study of the cervical spine revealed C4-5 paracentral disc extrusion with mild spinal stenosis and mild flattening of the ventral cervical cord.  There appear to be a shallow based disc osteophyte complex at the C5-6 level again with mild spinal stenosis and possible impingement of the right C6 nerve root.  The patient claims that over the last 3 years she has had some episodes of severe discomfort from the knee on the left down to the foot that may come and go and last for several days at a time.  She may have only one episode a month however.  When the pain comes on it is quite severe.  The patient has episodes of curling of the toes on both feet, she denies any numbness per se of the feet.  The patient has noted some mild gait instability, she has fallen several times recently.  She does have some issues with constipation but denies significant problems controlling the bladder.  The patient does have some occasional episodes of pain in the low back that will go down the left leg to the foot consistent with sciatica.  The patient recently had nerve conduction studies done that suggested a severe peripheral neuropathy possibly related to diabetes but the EMG study of the left leg was completely normal.  The patient is sent to this office for further  evaluation.  Past Medical History:  Diagnosis Date  . Anemia   . Anxiety   . Asthma   . Bipolar 1 disorder ([place])   . Chronic abdominal pain   . COPD (chronic obstructive pulmonary disease) ([place])   . Depression   . Diabetes mellitus   . Diabetic neuropathy ([place]) 10/29/2017  . Esophagitis   . Hiatal hernia   . Hyperlipidemia 02/03/2012  . Migraine   . Neck pain 06/08/2012  . Pancreatitis chronic Idiopathic   Treated by Dr. Newman Pies at [facility] in the past with celiac blocks.    Past Surgical History:  Procedure Laterality Date  . ABDOMINAL HYSTERECTOMY    . CELIAC PLEXUS BLOCK    . CHOLECYSTECTOMY    . COLONOSCOPY N/A 12/05/2012   JEH:UDJSHF rectum, colon and terminal ileum  . ESOPHAGOGASTRODUODENOSCOPY  02/20/2008   WYO:VZCHYIFOY distal esophageal mucosa, suspicious for neoplasm/Hiatal hernia otherwise normal   . ESOPHAGOGASTRODUODENOSCOPY  04/03/2008   DXA:JOINOMVE-HMCNO hiatal hernia, otherwise normal/Short, tight, benign-appearing peptic stricture  . ESOPHAGOGASTRODUODENOSCOPY  November 16, 2012   Dr. Britta Mccreedy: hiatal hernia, esophagitis,   . GIVENS CAPSULE STUDY N/A 12/05/2012   Few superficial erosions but nothing found to explain iron deficiency anemia.   . Ileocolonoscopy  06/05/2008     BSJ:GGEZMOQ anal canal, otherwise normal rectum, colon  . TONSILLECTOMY      Family History  Problem Relation Age of Onset  .  Asthma Other   . Asthma Paternal Grandfather   . Hypertension Mother   . Cancer Maternal Grandmother   . Diabetes Paternal Grandmother   . Coronary artery disease Neg Hx   . Colon cancer Neg Hx     Social history:  reports that she has been smoking cigarettes.  She has been smoking about 1.00 pack per day for the past 0.00 years. She has quit using smokeless tobacco. She reports that she does not drink alcohol or use drugs.  Medications:  Prior to Admission medications   Medication Sig Start Date End Date Taking? Authorizing Provider  albuterol  (PROVENTIL HFA;VENTOLIN HFA) 108 (90 BASE) MCG/ACT inhaler Inhale 2 puffs into the lungs daily as needed for wheezing. For shortness of breath   Yes [provider]  amitriptyline (ELAVIL) 75 MG tablet Take 75 mg by mouth at bedtime.   Yes [provider]  amLODipine (NORVASC) 10 MG tablet Take 10 mg by mouth at bedtime.    Yes [provider]  budesonide-formoterol (SYMBICORT) 80-4.5 MCG/ACT inhaler Inhale 2 puffs into the lungs 2 (two) times daily. 03/30/14  Yes Samuella Cota, MD  docusate sodium (COLACE) 100 MG capsule Take 100 mg by mouth at bedtime.    Yes [provider]  ferrous sulfate 325 (65 FE) MG tablet Take 325 mg by mouth daily.   Yes [provider]  ipratropium-albuterol (DUONEB) 0.5-2.5 (3) MG/3ML SOLN Inhale 3 mLs into the lungs every 6 (six) hours as needed. Take 3 mLs by nebulization every 6 (six) hours as needed for Wheezing. 07/01/13  Yes Ghimire, Henreitta Leber, MD  levothyroxine (SYNTHROID, LEVOTHROID) 25 MCG tablet Take 25 mcg by mouth daily before breakfast.   Yes [provider]  LORazepam (ATIVAN) 0.5 MG tablet Take 0.5 mg by mouth every 8 (eight) hours as needed for anxiety.    Yes [provider]  metFORMIN (GLUCOPHAGE) 500 MG tablet Take 1,000 mg by mouth 2 (two) times daily with a meal.  07/01/13  Yes Ghimire, Henreitta Leber, MD  oxycodone (ROXICODONE) 30 MG immediate release tablet Take 30 mg by mouth every 3 (three) hours as needed for pain.   Yes [provider]  OXYGEN Inhale 3 L into the lungs daily as needed (for shortness of breath).   Yes [provider]  Pancrelipase, Lip-Prot-Amyl, (ZENPEP) 20000 UNITS CPEP Take 2-3 capsules by mouth 5 (five) times daily. 3 capsules three times daily with each meal and 2 capsules with each snack.   Yes [provider]  pantoprazole (PROTONIX) 40 MG tablet Take 40 mg by mouth 2 (two) times daily.   Yes [provider]  polyethylene glycol  powder (GLYCOLAX/MIRALAX) powder Take 17 g by mouth 2 (two) times daily. Until daily soft stools  OTC 03/05/15  Yes Nona Dell, PA-C  promethazine (PHENERGAN) 25 MG tablet Take 25 mg by mouth every 6 (six) hours as needed for nausea or vomiting.   Yes [provider]  furosemide (LASIX) 20 MG tablet Take 1 tablet (20 mg total) by mouth daily. Take 20 mg daily AS NEEDED FOR SWELLING. 12/10/16 03/10/17  Arnoldo Lenis, MD  methocarbamol (ROBAXIN) 500 MG tablet Take 1 tablet (500 mg total) by mouth 2 (two) times daily. 10/29/17   Kathrynn Ducking, MD      Allergies  Allergen Reactions  . Penicillins Shortness Of Breath     Has patient had a PCN reaction causing immediate rash, facial/tongue/throat swelling, SOB or lightheadedness  with hypotension: Yes Has patient had a PCN reaction causing severe rash involving mucus membranes or skin necrosis: Yes Has patient had a PCN reaction that required hospitalization No Has patient had a PCN reaction occurring within the last 10 years: No  If all of the above answers are "NO", then may proceed with Cephalosporin use.   . Sulfa Antibiotics Shortness Of Breath  . Aspirin Nausea And Vomiting  . Lovenox  [Enoxaparin Sodium] Other (See Comments)    Has been known to cause her blood clots in her legs  . Prednisone Other (See Comments)    Pancreatitis, but has to take for asthma sometimes    ROS:  Out of a complete 14 system review of symptoms, the patient complains only of the following symptoms, and all other reviewed systems are negative.  Fatigue Heart murmur Blurred vision Constipation Urination problems Anemia, easy bruising Feeling cold Headache, dizziness Depression, anxiety Restless legs  Blood pressure 129/83, pulse (!) 105, height 5\' 3"  (1.6 m), weight 139 lb (63 kg).  Physical Exam  General: The patient is alert and cooperative at the time of the examination.  Eyes: Pupils are equal, round, and reactive  to light. Discs are flat bilaterally.  Neck: The neck is supple, no carotid bruits are noted.  Respiratory: The respiratory examination is clear.  Cardiovascular: The cardiovascular examination reveals a regular rate and rhythm, no obvious murmurs or rubs are noted.  Neuromuscular: Range move the low back is relatively full.  Skin: Extremities are without significant edema.  Neurologic Exam  Mental status: The patient is alert and oriented x 3 at the time of the examination. The patient has apparent normal recent and remote memory, with an apparently normal attention span and concentration ability.  Cranial nerves: Facial symmetry is present. There is good sensation of the face to pinprick and soft touch bilaterally. The strength of the facial muscles and the muscles to head turning and shoulder shrug are normal bilaterally. Speech is well enunciated, no aphasia or dysarthria is noted. Extraocular movements are full. Visual fields are full. The tongue is midline, and the patient has symmetric elevation of the soft palate. No obvious hearing deficits are noted.  Motor: The motor testing reveals 5 over 5 strength of all 4 extremities. Good symmetric motor tone is noted throughout.  Sensory: Sensory testing is intact to pinprick, soft touch, vibration sensation, and position sense on all 4 extremities, with exception there is some decreased vibration sensation on the left foot. No evidence of extinction is noted.  No clear stocking pattern pinprick sensory deficit is seen on either side.  Coordination: Cerebellar testing reveals good finger-nose-finger and heel-to-shin bilaterally.  Gait and station: Gait is normal. Tandem gait is slightly unsteady. Romberg is negative. No drift is seen.  The patient is able to walk on heels and the toes bilaterally.  Reflexes: Deep tendon reflexes are symmetric and normal bilaterally, possibly slightly brisk in the lower extremities.  The ankle jerk reflexes  are well-maintained bilaterally. Toes are downgoing bilaterally.   Assessment/Plan:  1.  Left lower extremity discomfort  EMG and nerve conduction study done recently suggests a relatively severe peripheral neuropathy but the clinical examination is not consistent with this.  The patient appears to have well maintained reflexes in the lower extremities that may actually be slightly brisk.  There is no clear evidence of a stocking pattern pinprick sensory deficit, and there is asymmetry of pain symptoms in the legs.  The patient will be sent  for MRI of the lumbar spine and thoracic spine.  Previously there were 2 levels of disc protrusions in the thoracic spine, the patient could potentially have mild spinal cord compression.  The patient will follow-up in 4 months.  The patient is already on methocarbamol and amitriptyline for her back discomfort.  Jill Alexanders MD 10/29/2017 10:25 AM  [facility] [address] [place], [place] [address]  Phone [phone] Fax [phone]

## 2017-10-29 NOTE — Patient Instructions (Signed)
   We will check MRI of the thoracic and lumbar spine

## 2017-11-01 ENCOUNTER — Telehealth: Payer: Self-pay | Admitting: Neurology

## 2017-11-01 NOTE — Telephone Encounter (Signed)
[employer] medicare/medicaid order sent to GI they will reach out to the pt to schedule.

## 2017-11-14 DIAGNOSIS — J449 Chronic obstructive pulmonary disease, unspecified: Secondary | ICD-10-CM | POA: Diagnosis not present

## 2017-11-14 DIAGNOSIS — J9610 Chronic respiratory failure, unspecified whether with hypoxia or hypercapnia: Secondary | ICD-10-CM | POA: Diagnosis not present

## 2017-11-17 ENCOUNTER — Ambulatory Visit
Admission: RE | Admit: 2017-11-17 | Discharge: 2017-11-17 | Disposition: A | Discharge status: Home / Self Care | Payer: Medicare Other | Source: Ambulatory Visit | Attending: Neurology | Admitting: Neurology

## 2017-11-17 DIAGNOSIS — M5442 Lumbago with sciatica, left side: Secondary | ICD-10-CM

## 2017-11-17 DIAGNOSIS — G8929 Other chronic pain: Secondary | ICD-10-CM

## 2017-11-17 DIAGNOSIS — M4804 Spinal stenosis, thoracic region: Secondary | ICD-10-CM

## 2017-11-18 DIAGNOSIS — J449 Chronic obstructive pulmonary disease, unspecified: Secondary | ICD-10-CM | POA: Diagnosis not present

## 2017-12-02 DIAGNOSIS — I10 Essential (primary) hypertension: Secondary | ICD-10-CM | POA: Diagnosis not present

## 2017-12-02 DIAGNOSIS — K861 Other chronic pancreatitis: Secondary | ICD-10-CM | POA: Diagnosis not present

## 2017-12-02 DIAGNOSIS — Z6824 Body mass index (BMI) 24.0-24.9, adult: Secondary | ICD-10-CM | POA: Diagnosis not present

## 2017-12-02 DIAGNOSIS — Z1389 Encounter for screening for other disorder: Secondary | ICD-10-CM | POA: Diagnosis not present

## 2017-12-02 DIAGNOSIS — E1142 Type 2 diabetes mellitus with diabetic polyneuropathy: Secondary | ICD-10-CM | POA: Diagnosis not present

## 2017-12-14 DIAGNOSIS — J449 Chronic obstructive pulmonary disease, unspecified: Secondary | ICD-10-CM | POA: Diagnosis not present

## 2017-12-14 DIAGNOSIS — J9610 Chronic respiratory failure, unspecified whether with hypoxia or hypercapnia: Secondary | ICD-10-CM | POA: Diagnosis not present

## 2017-12-18 DIAGNOSIS — J449 Chronic obstructive pulmonary disease, unspecified: Secondary | ICD-10-CM | POA: Diagnosis not present

## 2018-01-12 ENCOUNTER — Ambulatory Visit: Payer: Self-pay | Admitting: Neurology

## 2018-01-14 DIAGNOSIS — K861 Other chronic pancreatitis: Secondary | ICD-10-CM | POA: Diagnosis not present

## 2018-01-14 DIAGNOSIS — J449 Chronic obstructive pulmonary disease, unspecified: Secondary | ICD-10-CM | POA: Diagnosis not present

## 2018-01-14 DIAGNOSIS — J9610 Chronic respiratory failure, unspecified whether with hypoxia or hypercapnia: Secondary | ICD-10-CM | POA: Diagnosis not present

## 2018-01-18 DIAGNOSIS — J449 Chronic obstructive pulmonary disease, unspecified: Secondary | ICD-10-CM | POA: Diagnosis not present

## 2018-01-24 ENCOUNTER — Other Ambulatory Visit: Payer: Self-pay | Admitting: Pharmacist

## 2018-01-24 NOTE — Patient Outreach (Signed)
[facility]) Care Management  01/24/2018  [name] 1971-04-06 [id number]   Incoming call from [facility] in response to the EMMI Medication Adherence Campaign. Speak with patient. HIPAA identifiers verified and verbal consent received.  [name] reports that she takes her metformin 500 mg - 2 tablets twice daily as directed. Reports that she missed doses of this medication earlier in the year when she was having difficulty with getting a refill authorized from her PCP office to her pharmacy. However, reports that this problem was resolved. Reports that she uses [facility] for her medications. Counsel patient about the importance of medication adherence. Patient asks about the benefit of her blood pressure medications, as well as atorvastatin and metformin to her heart and kidneys. Counsel patient about the ways in which these medications help to protect her kidneys and heart. [name] denies any further medication questions or concerns.  Will close pharmacy episode at this time.  Harlow Asa, PharmD, [facility] [phone]

## 2018-02-13 DIAGNOSIS — J9610 Chronic respiratory failure, unspecified whether with hypoxia or hypercapnia: Secondary | ICD-10-CM | POA: Diagnosis not present

## 2018-02-13 DIAGNOSIS — J449 Chronic obstructive pulmonary disease, unspecified: Secondary | ICD-10-CM | POA: Diagnosis not present

## 2018-02-17 DIAGNOSIS — J449 Chronic obstructive pulmonary disease, unspecified: Secondary | ICD-10-CM | POA: Diagnosis not present

## 2018-02-21 DIAGNOSIS — E039 Hypothyroidism, unspecified: Secondary | ICD-10-CM | POA: Diagnosis not present

## 2018-02-21 DIAGNOSIS — E119 Type 2 diabetes mellitus without complications: Secondary | ICD-10-CM | POA: Diagnosis not present

## 2018-02-21 DIAGNOSIS — E782 Mixed hyperlipidemia: Secondary | ICD-10-CM | POA: Diagnosis not present

## 2018-02-21 DIAGNOSIS — Z1389 Encounter for screening for other disorder: Secondary | ICD-10-CM | POA: Diagnosis not present

## 2018-02-21 DIAGNOSIS — Z6823 Body mass index (BMI) 23.0-23.9, adult: Secondary | ICD-10-CM | POA: Diagnosis not present

## 2018-02-21 DIAGNOSIS — Z0001 Encounter for general adult medical examination with abnormal findings: Secondary | ICD-10-CM | POA: Diagnosis not present

## 2018-02-21 DIAGNOSIS — G894 Chronic pain syndrome: Secondary | ICD-10-CM | POA: Diagnosis not present

## 2018-02-21 DIAGNOSIS — R600 Localized edema: Secondary | ICD-10-CM | POA: Diagnosis not present

## 2018-03-09 ENCOUNTER — Ambulatory Visit: Payer: Medicare Other | Admitting: Neurology

## 2018-03-16 DIAGNOSIS — J449 Chronic obstructive pulmonary disease, unspecified: Secondary | ICD-10-CM | POA: Diagnosis not present

## 2018-03-16 DIAGNOSIS — J9610 Chronic respiratory failure, unspecified whether with hypoxia or hypercapnia: Secondary | ICD-10-CM | POA: Diagnosis not present

## 2018-03-20 DIAGNOSIS — J449 Chronic obstructive pulmonary disease, unspecified: Secondary | ICD-10-CM | POA: Diagnosis not present

## 2018-03-23 DIAGNOSIS — J449 Chronic obstructive pulmonary disease, unspecified: Secondary | ICD-10-CM | POA: Diagnosis not present

## 2018-03-23 DIAGNOSIS — E860 Dehydration: Secondary | ICD-10-CM | POA: Diagnosis not present

## 2018-03-23 DIAGNOSIS — Z7984 Long term (current) use of oral hypoglycemic drugs: Secondary | ICD-10-CM | POA: Diagnosis not present

## 2018-03-23 DIAGNOSIS — K859 Acute pancreatitis without necrosis or infection, unspecified: Secondary | ICD-10-CM | POA: Diagnosis not present

## 2018-03-23 DIAGNOSIS — K861 Other chronic pancreatitis: Secondary | ICD-10-CM | POA: Diagnosis not present

## 2018-03-23 DIAGNOSIS — N179 Acute kidney failure, unspecified: Secondary | ICD-10-CM | POA: Diagnosis not present

## 2018-03-23 DIAGNOSIS — M797 Fibromyalgia: Secondary | ICD-10-CM | POA: Diagnosis not present

## 2018-03-23 DIAGNOSIS — Z79899 Other long term (current) drug therapy: Secondary | ICD-10-CM | POA: Diagnosis not present

## 2018-03-23 DIAGNOSIS — R1013 Epigastric pain: Secondary | ICD-10-CM | POA: Diagnosis not present

## 2018-03-23 DIAGNOSIS — Z7951 Long term (current) use of inhaled steroids: Secondary | ICD-10-CM | POA: Diagnosis not present

## 2018-03-23 DIAGNOSIS — E785 Hyperlipidemia, unspecified: Secondary | ICD-10-CM | POA: Diagnosis not present

## 2018-03-23 DIAGNOSIS — E119 Type 2 diabetes mellitus without complications: Secondary | ICD-10-CM | POA: Diagnosis not present

## 2018-03-23 DIAGNOSIS — E871 Hypo-osmolality and hyponatremia: Secondary | ICD-10-CM | POA: Diagnosis not present

## 2018-03-23 DIAGNOSIS — K219 Gastro-esophageal reflux disease without esophagitis: Secondary | ICD-10-CM | POA: Diagnosis not present

## 2018-03-23 DIAGNOSIS — R109 Unspecified abdominal pain: Secondary | ICD-10-CM | POA: Diagnosis not present

## 2018-03-23 DIAGNOSIS — Z9981 Dependence on supplemental oxygen: Secondary | ICD-10-CM | POA: Diagnosis not present

## 2018-03-23 DIAGNOSIS — J9611 Chronic respiratory failure with hypoxia: Secondary | ICD-10-CM | POA: Diagnosis not present

## 2018-03-23 DIAGNOSIS — I10 Essential (primary) hypertension: Secondary | ICD-10-CM | POA: Diagnosis not present

## 2018-03-23 DIAGNOSIS — Z87891 Personal history of nicotine dependence: Secondary | ICD-10-CM | POA: Diagnosis not present

## 2018-03-24 DIAGNOSIS — E860 Dehydration: Secondary | ICD-10-CM | POA: Diagnosis not present

## 2018-03-24 DIAGNOSIS — R1013 Epigastric pain: Secondary | ICD-10-CM | POA: Diagnosis not present

## 2018-03-24 DIAGNOSIS — R109 Unspecified abdominal pain: Secondary | ICD-10-CM | POA: Diagnosis not present

## 2018-03-24 DIAGNOSIS — M797 Fibromyalgia: Secondary | ICD-10-CM | POA: Diagnosis not present

## 2018-03-24 DIAGNOSIS — I10 Essential (primary) hypertension: Secondary | ICD-10-CM | POA: Diagnosis not present

## 2018-03-24 DIAGNOSIS — E871 Hypo-osmolality and hyponatremia: Secondary | ICD-10-CM | POA: Diagnosis not present

## 2018-03-24 DIAGNOSIS — K219 Gastro-esophageal reflux disease without esophagitis: Secondary | ICD-10-CM | POA: Diagnosis not present

## 2018-03-24 DIAGNOSIS — K859 Acute pancreatitis without necrosis or infection, unspecified: Secondary | ICD-10-CM | POA: Diagnosis not present

## 2018-03-24 DIAGNOSIS — Z7951 Long term (current) use of inhaled steroids: Secondary | ICD-10-CM | POA: Diagnosis not present

## 2018-03-24 DIAGNOSIS — J9611 Chronic respiratory failure with hypoxia: Secondary | ICD-10-CM | POA: Diagnosis not present

## 2018-03-24 DIAGNOSIS — Z79899 Other long term (current) drug therapy: Secondary | ICD-10-CM | POA: Diagnosis not present

## 2018-03-24 DIAGNOSIS — Z87891 Personal history of nicotine dependence: Secondary | ICD-10-CM | POA: Diagnosis not present

## 2018-03-24 DIAGNOSIS — Z7984 Long term (current) use of oral hypoglycemic drugs: Secondary | ICD-10-CM | POA: Diagnosis not present

## 2018-03-24 DIAGNOSIS — E119 Type 2 diabetes mellitus without complications: Secondary | ICD-10-CM | POA: Diagnosis not present

## 2018-03-24 DIAGNOSIS — J449 Chronic obstructive pulmonary disease, unspecified: Secondary | ICD-10-CM | POA: Diagnosis not present

## 2018-03-24 DIAGNOSIS — K861 Other chronic pancreatitis: Secondary | ICD-10-CM | POA: Diagnosis not present

## 2018-03-24 DIAGNOSIS — N179 Acute kidney failure, unspecified: Secondary | ICD-10-CM | POA: Diagnosis not present

## 2018-03-24 DIAGNOSIS — Z9981 Dependence on supplemental oxygen: Secondary | ICD-10-CM | POA: Diagnosis not present

## 2018-03-24 DIAGNOSIS — E785 Hyperlipidemia, unspecified: Secondary | ICD-10-CM | POA: Diagnosis not present

## 2018-03-25 DIAGNOSIS — Z9981 Dependence on supplemental oxygen: Secondary | ICD-10-CM | POA: Diagnosis not present

## 2018-03-25 DIAGNOSIS — M797 Fibromyalgia: Secondary | ICD-10-CM | POA: Diagnosis not present

## 2018-03-25 DIAGNOSIS — E119 Type 2 diabetes mellitus without complications: Secondary | ICD-10-CM | POA: Diagnosis not present

## 2018-03-25 DIAGNOSIS — E785 Hyperlipidemia, unspecified: Secondary | ICD-10-CM | POA: Diagnosis not present

## 2018-03-25 DIAGNOSIS — K859 Acute pancreatitis without necrosis or infection, unspecified: Secondary | ICD-10-CM | POA: Diagnosis not present

## 2018-03-25 DIAGNOSIS — Z7984 Long term (current) use of oral hypoglycemic drugs: Secondary | ICD-10-CM | POA: Diagnosis not present

## 2018-03-25 DIAGNOSIS — E860 Dehydration: Secondary | ICD-10-CM | POA: Diagnosis not present

## 2018-03-25 DIAGNOSIS — K861 Other chronic pancreatitis: Secondary | ICD-10-CM | POA: Diagnosis not present

## 2018-03-25 DIAGNOSIS — J9611 Chronic respiratory failure with hypoxia: Secondary | ICD-10-CM | POA: Diagnosis not present

## 2018-03-25 DIAGNOSIS — Z87891 Personal history of nicotine dependence: Secondary | ICD-10-CM | POA: Diagnosis not present

## 2018-03-25 DIAGNOSIS — Z79899 Other long term (current) drug therapy: Secondary | ICD-10-CM | POA: Diagnosis not present

## 2018-03-25 DIAGNOSIS — I10 Essential (primary) hypertension: Secondary | ICD-10-CM | POA: Diagnosis not present

## 2018-03-25 DIAGNOSIS — Z7951 Long term (current) use of inhaled steroids: Secondary | ICD-10-CM | POA: Diagnosis not present

## 2018-03-25 DIAGNOSIS — E871 Hypo-osmolality and hyponatremia: Secondary | ICD-10-CM | POA: Diagnosis not present

## 2018-03-25 DIAGNOSIS — K219 Gastro-esophageal reflux disease without esophagitis: Secondary | ICD-10-CM | POA: Diagnosis not present

## 2018-03-25 DIAGNOSIS — J449 Chronic obstructive pulmonary disease, unspecified: Secondary | ICD-10-CM | POA: Diagnosis not present

## 2018-03-30 DIAGNOSIS — D72829 Elevated white blood cell count, unspecified: Secondary | ICD-10-CM | POA: Diagnosis not present

## 2018-03-30 DIAGNOSIS — J449 Chronic obstructive pulmonary disease, unspecified: Secondary | ICD-10-CM | POA: Diagnosis not present

## 2018-03-30 DIAGNOSIS — K858 Other acute pancreatitis without necrosis or infection: Secondary | ICD-10-CM | POA: Diagnosis not present

## 2018-03-30 DIAGNOSIS — Z6822 Body mass index (BMI) 22.0-22.9, adult: Secondary | ICD-10-CM | POA: Diagnosis not present

## 2018-03-30 DIAGNOSIS — G894 Chronic pain syndrome: Secondary | ICD-10-CM | POA: Diagnosis not present

## 2018-03-30 DIAGNOSIS — E860 Dehydration: Secondary | ICD-10-CM | POA: Diagnosis not present

## 2018-03-30 DIAGNOSIS — E871 Hypo-osmolality and hyponatremia: Secondary | ICD-10-CM | POA: Diagnosis not present

## 2018-03-30 DIAGNOSIS — E1140 Type 2 diabetes mellitus with diabetic neuropathy, unspecified: Secondary | ICD-10-CM | POA: Diagnosis not present

## 2018-04-04 ENCOUNTER — Ambulatory Visit (INDEPENDENT_AMBULATORY_CARE_PROVIDER_SITE_OTHER): Payer: Medicare Other | Admitting: Neurology

## 2018-04-04 ENCOUNTER — Encounter: Payer: Self-pay | Admitting: Neurology

## 2018-04-04 VITALS — BP 114/64 | HR 104 | Ht 62.0 in | Wt 128.0 lb

## 2018-04-04 DIAGNOSIS — R292 Abnormal reflex: Secondary | ICD-10-CM | POA: Diagnosis not present

## 2018-04-04 DIAGNOSIS — E1142 Type 2 diabetes mellitus with diabetic polyneuropathy: Secondary | ICD-10-CM

## 2018-04-04 DIAGNOSIS — M4306 Spondylolysis, lumbar region: Secondary | ICD-10-CM

## 2018-04-04 DIAGNOSIS — G8929 Other chronic pain: Secondary | ICD-10-CM

## 2018-04-04 DIAGNOSIS — M5442 Lumbago with sciatica, left side: Secondary | ICD-10-CM

## 2018-04-04 NOTE — Patient Instructions (Signed)
We will order MRI lumbar spine and call you with the results   

## 2018-04-04 NOTE — Progress Notes (Signed)
[facility] Clinic Note - Initial Visit   Date: 04/04/18  [name] MRN: [id number] DOB: 12/06/1970   Dear Dr. Layne Benton:  Thank you for your kind referral of [name] for consultation of left leg pain. Although her history is well known to you, please allow [place] to reiterate it for the purpose of our medical record. The patient was accompanied to the clinic by husband who also provides collateral information.     History of Present Illness: [name] is a 46 y.o. right-handed Caucasian female with bipolar disorder, COPD, depression, asthma, diabetic neuropathy, hyperlipidemia, pancreatitis, chronic pain, and migraine presenting for evaluation of left leg pain.    Starting in the summer 2018, she began having firey sensation over the front of the left leg.  Pain occurs about once per month and lasts 1-2 days. Rest and heat application helps.  She was started on amitriptyline 75mg  at bedtime, which has significantly alleviated her pain. She does not have weakness. She endorses imbalance and has fallen a few times.  She also complain of chronic neck, mid-back, and low back pain.  She has not done physical therapy for these symptoms.   NCS/EMG performed earlier this year showed distal and symmetric sensorimotor polyneuropathy affecting the feet. EMG of the left leg was normal.   She takes oxycodone 30mg  six times daily, which is prescribed by her primary care provider.   In 2014, she had CT myelogram following a fall which showed C4-5 disc extrusion causing mild central canal stenosis, C5-6 cervical spondylosis, chronic T12 compression fracture, thoracic spondylosis at T6-7 and T9-10, and normal lumbar spine.  Out-side paper records, electronic medical record, and images have been reviewed where available and summarized as:  NCS/EMG of the legs 09/28/2017:  The electrophysiologic findings are most consistent with a distal and symmetric sensorimotor  polyneuropathy, axon loss in type, affecting the lower extremities.   Past Medical History:  Diagnosis Date  . Anemia   . Anxiety   . Asthma   . Bipolar 1 disorder ([place])   . Chronic abdominal pain   . COPD (chronic obstructive pulmonary disease) ([place])   . Depression   . Diabetes mellitus   . Diabetic neuropathy ([place]) 10/29/2017  . Esophagitis   . Hiatal hernia   . Hyperlipidemia 02/03/2012  . Migraine   . Neck pain 06/08/2012  . Pancreatitis chronic Idiopathic   Treated by Dr. Newman Pies at [facility] in the past with celiac blocks.    Past Surgical History:  Procedure Laterality Date  . ABDOMINAL HYSTERECTOMY    . CELIAC PLEXUS BLOCK    . CHOLECYSTECTOMY    . COLONOSCOPY N/A 12/05/2012   WUJ:WJXBJY rectum, colon and terminal ileum  . ESOPHAGOGASTRODUODENOSCOPY  02/20/2008   NWG:[id number] distal esophageal mucosa, suspicious for neoplasm/Hiatal hernia otherwise normal   . ESOPHAGOGASTRODUODENOSCOPY  04/03/2008   VHQ:[id number]-[id number] hiatal hernia, otherwise normal/Short, tight, benign-appearing peptic stricture  . ESOPHAGOGASTRODUODENOSCOPY  November 16, 2012   Dr. Britta Mccreedy: hiatal hernia, esophagitis,   . GIVENS CAPSULE STUDY N/A 12/05/2012   Few superficial erosions but nothing found to explain iron deficiency anemia.   . Ileocolonoscopy  06/05/2008     GMW:NUUVOZD anal canal, otherwise normal rectum, colon  . TONSILLECTOMY       Medications:  Outpatient Encounter Medications as of 04/04/2018  Medication Sig  . albuterol (PROVENTIL HFA;VENTOLIN HFA) 108 (90 BASE) MCG/ACT inhaler Inhale 2 puffs into the lungs daily as needed for wheezing. For shortness of breath  .  amitriptyline (ELAVIL) 75 MG tablet Take 75 mg by mouth at bedtime.  Marland Kitchen amLODipine (NORVASC) 10 MG tablet Take 10 mg by mouth at bedtime.   Marland Kitchen atorvastatin (LIPITOR) 20 MG tablet   . budesonide-formoterol (SYMBICORT) 80-4.5 MCG/ACT inhaler Inhale 2 puffs into the lungs 2 (two) times daily.  Marland Kitchen docusate sodium (COLACE)  100 MG capsule Take 100 mg by mouth at bedtime.   . ferrous sulfate 325 (65 FE) MG tablet Take 325 mg by mouth daily.  Marland Kitchen ipratropium-albuterol (DUONEB) 0.5-2.5 (3) MG/3ML SOLN Inhale 3 mLs into the lungs every 6 (six) hours as needed. Take 3 mLs by nebulization every 6 (six) hours as needed for Wheezing.  Marland Kitchen levothyroxine (SYNTHROID, LEVOTHROID) 25 MCG tablet Take 25 mcg by mouth daily before breakfast.  . lisinopril (PRINIVIL,ZESTRIL) 2.5 MG tablet   . LORazepam (ATIVAN) 0.5 MG tablet Take 0.5 mg by mouth every 8 (eight) hours as needed for anxiety.   . metFORMIN (GLUCOPHAGE) 500 MG tablet Take 1,000 mg by mouth 2 (two) times daily with a meal.   . methocarbamol (ROBAXIN) 500 MG tablet Take 1 tablet (500 mg total) by mouth 2 (two) times daily.  Marland Kitchen oxycodone (ROXICODONE) 30 MG immediate release tablet Take 30 mg by mouth every 3 (three) hours as needed for pain.  . OXYGEN Inhale 3 L into the lungs daily as needed (for shortness of breath).  . Pancrelipase, Lip-Prot-Amyl, (ZENPEP) 20000 UNITS CPEP Take 2-3 capsules by mouth 5 (five) times daily. 3 capsules three times daily with each meal and 2 capsules with each snack.  . pantoprazole (PROTONIX) 40 MG tablet Take 40 mg by mouth 2 (two) times daily.  . polyethylene glycol powder (GLYCOLAX/MIRALAX) powder Take 17 g by mouth 2 (two) times daily. Until daily soft stools  OTC  . promethazine (PHENERGAN) 25 MG tablet Take 25 mg by mouth every 6 (six) hours as needed for nausea or vomiting.  . furosemide (LASIX) 20 MG tablet Take 1 tablet (20 mg total) by mouth daily. Take 20 mg daily AS NEEDED FOR SWELLING.   No facility-administered encounter medications on file as of 04/04/2018.      Allergies:  Allergies  Allergen Reactions  . Penicillins Shortness Of Breath     Has patient had a PCN reaction causing immediate rash, facial/tongue/throat swelling, SOB or lightheadedness with hypotension: Yes Has patient had a PCN reaction causing severe rash  involving mucus membranes or skin necrosis: Yes Has patient had a PCN reaction that required hospitalization No Has patient had a PCN reaction occurring within the last 10 years: No  If all of the above answers are "NO", then may proceed with Cephalosporin use.   . Sulfa Antibiotics Shortness Of Breath  . Aspirin Nausea And Vomiting  . Lovenox  [Enoxaparin Sodium] Other (See Comments)    Has been known to cause her blood clots in her legs  . Prednisone Other (See Comments)    Pancreatitis, but has to take for asthma sometimes    Family History: Family History  Problem Relation Age of Onset  . Asthma Other   . Asthma Paternal Grandfather   . Heart attack Paternal Grandfather   . Hypertension Mother   . Cancer Maternal Grandmother   . Diabetes Paternal Grandmother   . Coronary artery disease Neg Hx   . Colon cancer Neg Hx     Social History: Social History   Tobacco Use  . Smoking status: Current Every Day Smoker    Packs/day: 1.00  Years: 0.00    Pack years: 0.00    Types: Cigarettes  . Smokeless tobacco: Former Network engineer Use Topics  . Alcohol use: No  . Drug use: No   Social History   Social History Narrative   She is on disability for pancreatitis since 2014.  She was previously working as a Presenter, broadcasting in 2004.   She lives with her husband in a two level home.     Review of Systems:  CONSTITUTIONAL: No fevers, chills, night sweats, or weight loss.   EYES: No visual changes or eye pain ENT: No hearing changes.  No history of nose bleeds.   RESPIRATORY: No cough, wheezing +shortness of breath.   CARDIOVASCULAR: Negative for chest pain, and palpitations.   GI: +for abdominal discomfort, blood in stools or black stools.  No recent change in bowel habits.   GU:  No history of incontinence.   MUSCLOSKELETAL: +history of joint pain or swelling.  No myalgias.   SKIN: Negative for lesions, rash, and itching.   HEMATOLOGY/ONCOLOGY: Negative for prolonged  bleeding, bruising easily, and swollen nodes.  No history of cancer.   ENDOCRINE: Negative for cold or heat intolerance, polydipsia or goiter.   PSYCH:  +depression or anxiety symptoms.   NEURO: As Above.   Vital Signs:  BP 114/64   Pulse (!) 104   Ht 5\' 2"  (1.575 m)   Wt 128 lb (58.1 kg)   SpO2 97%   BMI 23.41 kg/m    General Medical Exam:   General:  Older-appearing than stated age, comfortable.   Eyes/ENT: see cranial nerve examination.   Neck: No masses appreciated.  Full range of motion without tenderness.  No carotid bruits. Respiratory:  Clear to auscultation, good air entry bilaterally.   Cardiac:  Regular rate and rhythm, no murmur.   Extremities:  No deformities, edema, or skin discoloration.  Skin:  No rashes or lesions.  Neurological Exam: MENTAL STATUS including orientation to time, place, person, recent and remote memory, attention span and concentration, language, and fund of knowledge is normal.  Speech is not dysarthric.  CRANIAL NERVES: II:  No visual field defects.  Unremarkable fundi.   III-IV-VI: Pupils equal round and reactive to light.  Normal conjugate, extra-ocular eye movements in all directions of gaze.  No nystagmus.  No ptosis.   V:  Normal facial sensation.    VII:  Normal facial symmetry and movements.   VIII:  Normal hearing and vestibular function.   IX-X:  Normal palatal movement.   XI:  Normal shoulder shrug and head rotation.   XII:  Normal tongue strength and range of motion, no deviation or fasciculation.  MOTOR:  No atrophy, fasciculations or abnormal movements.  No pronator drift.  Tone is normal.    Right Upper Extremity:    Left Upper Extremity:    Deltoid  5/5   Deltoid  5/5   Biceps  5/5   Biceps  5/5   Triceps  5/5   Triceps  5/5   Wrist extensors  5/5   Wrist extensors  5/5   Wrist flexors  5/5   Wrist flexors  5/5   Finger extensors  5/5   Finger extensors  5/5   Finger flexors  5/5   Finger flexors  5/5   Dorsal interossei   5/5   Dorsal interossei  5/5   Abductor pollicis  5/5   Abductor pollicis  5/5   Tone (Ashworth scale)  0  Tone (  Ashworth scale)  0   Right Lower Extremity:    Left Lower Extremity:    Hip flexors  5/5   Hip flexors  5/5   Hip extensors  5/5   Hip extensors  5/5   Knee flexors  5/5   Knee flexors  5/5   Knee extensors  5/5   Knee extensors  5/5   Dorsiflexors  5/5   Dorsiflexors  5/5   Plantarflexors  5/5   Plantarflexors  5/5   Toe extensors  5/5   Toe extensors  5/5   Toe flexors  5/5   Toe flexors  5/5   Tone (Ashworth scale)  0  Tone (Ashworth scale)  0   MSRs:  Right                                                                 Left brachioradialis 2+  brachioradialis 2+  biceps 2+  biceps 2+  triceps 2+  triceps 2+  patellar 3+  patellar 3+  ankle jerk 2+  ankle jerk 2+  Hoffman no  Hoffman no  plantar response down  plantar response down   SENSORY:  Temperature, pin prick, and vibration is reduced distal to ankles, vibration is absent at the great toe bilaterally. Romberg's sign absent.   COORDINATION/GAIT: Normal finger-to- nose-finger.  Intact rapid alternating movements bilaterally.   Gait slightly wide-based and stable. Stressed gait is intact. She is unsteady with tandem gait.   IMPRESSION: 1.  Left leg radicular pain.  With her brisk patella jerks, she may have lumbar or thoracic canal stenosis.  I will proceed with MRI lumbar spine wo contrast to evaluate for compressive lesion.  PT was declined.  Pain is well-controlled on her regimen as prescribed by her PCP.  She is on high dose oxycodone 30mg  6 times daily for pancreatitis, amitriptyline 75mg  daily for neuropathic pain, and robaxin 500mg  BID.   2.  Diabetic peripheral neuropathy affecting bilateral feet manifesting with numbness.  NCS/EMG results discussed with patient.  Fall precautions and daily foot inspection also discussed.  Further recommendations based on results of her imaging.    Thank you for  allowing me to participate in patient's care.  If I can answer any additional questions, I would be pleased to do so.    Sincerely,    Chelsea K. Posey Pronto, DO

## 2018-04-16 DIAGNOSIS — J9610 Chronic respiratory failure, unspecified whether with hypoxia or hypercapnia: Secondary | ICD-10-CM | POA: Diagnosis not present

## 2018-04-16 DIAGNOSIS — J449 Chronic obstructive pulmonary disease, unspecified: Secondary | ICD-10-CM | POA: Diagnosis not present

## 2018-04-20 DIAGNOSIS — J449 Chronic obstructive pulmonary disease, unspecified: Secondary | ICD-10-CM | POA: Diagnosis not present

## 2018-04-21 DIAGNOSIS — Z7984 Long term (current) use of oral hypoglycemic drugs: Secondary | ICD-10-CM | POA: Diagnosis not present

## 2018-04-21 DIAGNOSIS — H5213 Myopia, bilateral: Secondary | ICD-10-CM | POA: Diagnosis not present

## 2018-04-21 DIAGNOSIS — E1165 Type 2 diabetes mellitus with hyperglycemia: Secondary | ICD-10-CM | POA: Diagnosis not present

## 2018-04-21 DIAGNOSIS — E119 Type 2 diabetes mellitus without complications: Secondary | ICD-10-CM | POA: Diagnosis not present

## 2018-04-21 DIAGNOSIS — D3132 Benign neoplasm of left choroid: Secondary | ICD-10-CM | POA: Diagnosis not present

## 2018-04-26 ENCOUNTER — Telehealth: Payer: Self-pay | Admitting: Neurology

## 2018-04-26 NOTE — Telephone Encounter (Signed)
I spoke with patient and she said that she is also hurting under her shoulder blades.  Hx of broken spine.  She was wondering if we could add MRI thoracic spine.  Informed her that Dr. Posey Pronto is out of the office until Tuesday but she said that she would wait until she gets back.

## 2018-04-26 NOTE — Telephone Encounter (Signed)
Patient has not heard from anyone about getting the MRI schedule please call

## 2018-04-27 DIAGNOSIS — H5213 Myopia, bilateral: Secondary | ICD-10-CM | POA: Diagnosis not present

## 2018-04-28 DIAGNOSIS — Z8249 Family history of ischemic heart disease and other diseases of the circulatory system: Secondary | ICD-10-CM | POA: Diagnosis not present

## 2018-04-28 DIAGNOSIS — J159 Unspecified bacterial pneumonia: Secondary | ICD-10-CM | POA: Diagnosis not present

## 2018-04-28 DIAGNOSIS — I10 Essential (primary) hypertension: Secondary | ICD-10-CM | POA: Diagnosis not present

## 2018-04-28 DIAGNOSIS — J449 Chronic obstructive pulmonary disease, unspecified: Secondary | ICD-10-CM | POA: Diagnosis not present

## 2018-04-28 DIAGNOSIS — Z79899 Other long term (current) drug therapy: Secondary | ICD-10-CM | POA: Diagnosis not present

## 2018-04-28 DIAGNOSIS — E119 Type 2 diabetes mellitus without complications: Secondary | ICD-10-CM | POA: Diagnosis not present

## 2018-04-28 DIAGNOSIS — Z720 Tobacco use: Secondary | ICD-10-CM | POA: Diagnosis not present

## 2018-04-28 DIAGNOSIS — J189 Pneumonia, unspecified organism: Secondary | ICD-10-CM | POA: Diagnosis not present

## 2018-04-28 DIAGNOSIS — R079 Chest pain, unspecified: Secondary | ICD-10-CM | POA: Diagnosis not present

## 2018-04-28 DIAGNOSIS — Z9981 Dependence on supplemental oxygen: Secondary | ICD-10-CM | POA: Diagnosis not present

## 2018-04-28 DIAGNOSIS — Z825 Family history of asthma and other chronic lower respiratory diseases: Secondary | ICD-10-CM | POA: Diagnosis not present

## 2018-04-28 DIAGNOSIS — R509 Fever, unspecified: Secondary | ICD-10-CM | POA: Diagnosis not present

## 2018-04-28 DIAGNOSIS — Z7984 Long term (current) use of oral hypoglycemic drugs: Secondary | ICD-10-CM | POA: Diagnosis not present

## 2018-05-13 ENCOUNTER — Telehealth: Payer: Self-pay | Admitting: Neurology

## 2018-05-13 NOTE — Telephone Encounter (Signed)
Patient is calling In stating that she hasn't heard from anyone regarding her MRI appt. Please call her back. Thanks!

## 2018-05-16 DIAGNOSIS — J9610 Chronic respiratory failure, unspecified whether with hypoxia or hypercapnia: Secondary | ICD-10-CM | POA: Diagnosis not present

## 2018-05-16 DIAGNOSIS — J449 Chronic obstructive pulmonary disease, unspecified: Secondary | ICD-10-CM | POA: Diagnosis not present

## 2018-05-20 DIAGNOSIS — J449 Chronic obstructive pulmonary disease, unspecified: Secondary | ICD-10-CM | POA: Diagnosis not present

## 2018-06-01 DIAGNOSIS — M5124 Other intervertebral disc displacement, thoracic region: Secondary | ICD-10-CM | POA: Diagnosis not present

## 2018-06-01 DIAGNOSIS — M47817 Spondylosis without myelopathy or radiculopathy, lumbosacral region: Secondary | ICD-10-CM | POA: Diagnosis not present

## 2018-06-01 DIAGNOSIS — M40204 Unspecified kyphosis, thoracic region: Secondary | ICD-10-CM | POA: Diagnosis not present

## 2018-06-01 DIAGNOSIS — M5410 Radiculopathy, site unspecified: Secondary | ICD-10-CM | POA: Diagnosis not present

## 2018-06-01 DIAGNOSIS — M438X5 Other specified deforming dorsopathies, thoracolumbar region: Secondary | ICD-10-CM | POA: Diagnosis not present

## 2018-06-03 ENCOUNTER — Telehealth: Payer: Self-pay | Admitting: Neurology

## 2018-06-03 NOTE — Telephone Encounter (Signed)
Please inform patient that her MRI of the lumbar spine does not show any nerve impingement, a mild each related arthritic changes.  We can order MRI thoracic spine to look for nerve compression higher up in the spine as the next step.   MRI lumbar spine 06/01/2018 performed at [facility]: 1.  No acute abnormality within the lumbar spine.  Mild degenerative changes at L5-S1 with mild foraminal stenosis. 2.  Old appearing superior endplate deformity at C37, no evidence of acute compression fracture.  There is a small disc bulge and mild kyphosis at T11 and T12 with mild central canal stenosis.  [name] K. Posey Pronto, DO

## 2018-06-07 ENCOUNTER — Encounter: Payer: Self-pay | Admitting: *Deleted

## 2018-06-07 NOTE — Telephone Encounter (Signed)
Called patient but her mailbox is full.

## 2018-06-07 NOTE — Telephone Encounter (Signed)
Results sent via My Chart.  Requested for patient to let me know if she would like to have the MRI thoracic spine done.

## 2018-06-09 ENCOUNTER — Telehealth: Payer: Self-pay | Admitting: Neurology

## 2018-06-09 NOTE — Telephone Encounter (Signed)
Is this without?

## 2018-06-09 NOTE — Telephone Encounter (Signed)
Patient called and would like to have MRI ordered. Thanks

## 2018-06-10 ENCOUNTER — Other Ambulatory Visit: Payer: Self-pay | Admitting: *Deleted

## 2018-06-10 DIAGNOSIS — K861 Other chronic pancreatitis: Secondary | ICD-10-CM | POA: Diagnosis not present

## 2018-06-10 DIAGNOSIS — Z23 Encounter for immunization: Secondary | ICD-10-CM | POA: Diagnosis not present

## 2018-06-10 DIAGNOSIS — Z7984 Long term (current) use of oral hypoglycemic drugs: Secondary | ICD-10-CM | POA: Diagnosis not present

## 2018-06-10 DIAGNOSIS — Z9981 Dependence on supplemental oxygen: Secondary | ICD-10-CM | POA: Diagnosis not present

## 2018-06-10 DIAGNOSIS — R292 Abnormal reflex: Secondary | ICD-10-CM

## 2018-06-10 DIAGNOSIS — E119 Type 2 diabetes mellitus without complications: Secondary | ICD-10-CM | POA: Diagnosis not present

## 2018-06-10 DIAGNOSIS — J449 Chronic obstructive pulmonary disease, unspecified: Secondary | ICD-10-CM | POA: Diagnosis not present

## 2018-06-10 DIAGNOSIS — R748 Abnormal levels of other serum enzymes: Secondary | ICD-10-CM | POA: Diagnosis not present

## 2018-06-10 DIAGNOSIS — Z9049 Acquired absence of other specified parts of digestive tract: Secondary | ICD-10-CM | POA: Diagnosis not present

## 2018-06-10 DIAGNOSIS — K859 Acute pancreatitis without necrosis or infection, unspecified: Secondary | ICD-10-CM | POA: Diagnosis not present

## 2018-06-10 DIAGNOSIS — I10 Essential (primary) hypertension: Secondary | ICD-10-CM | POA: Diagnosis not present

## 2018-06-10 DIAGNOSIS — R112 Nausea with vomiting, unspecified: Secondary | ICD-10-CM | POA: Diagnosis not present

## 2018-06-10 DIAGNOSIS — M4804 Spinal stenosis, thoracic region: Secondary | ICD-10-CM

## 2018-06-10 DIAGNOSIS — G894 Chronic pain syndrome: Secondary | ICD-10-CM | POA: Diagnosis not present

## 2018-06-10 DIAGNOSIS — E039 Hypothyroidism, unspecified: Secondary | ICD-10-CM | POA: Diagnosis not present

## 2018-06-10 NOTE — Telephone Encounter (Signed)
MRI thoracic spine wo contrast.

## 2018-06-10 NOTE — Telephone Encounter (Signed)
Order faxed to [facility] 

## 2018-06-12 DIAGNOSIS — Z23 Encounter for immunization: Secondary | ICD-10-CM | POA: Diagnosis not present

## 2018-06-15 DIAGNOSIS — H5213 Myopia, bilateral: Secondary | ICD-10-CM | POA: Diagnosis not present

## 2018-06-15 DIAGNOSIS — H52223 Regular astigmatism, bilateral: Secondary | ICD-10-CM | POA: Diagnosis not present

## 2018-06-16 DIAGNOSIS — J449 Chronic obstructive pulmonary disease, unspecified: Secondary | ICD-10-CM | POA: Diagnosis not present

## 2018-06-16 DIAGNOSIS — J9610 Chronic respiratory failure, unspecified whether with hypoxia or hypercapnia: Secondary | ICD-10-CM | POA: Diagnosis not present

## 2018-06-20 DIAGNOSIS — K861 Other chronic pancreatitis: Secondary | ICD-10-CM | POA: Diagnosis not present

## 2018-06-20 DIAGNOSIS — J449 Chronic obstructive pulmonary disease, unspecified: Secondary | ICD-10-CM | POA: Diagnosis not present

## 2018-06-20 DIAGNOSIS — K209 Esophagitis, unspecified: Secondary | ICD-10-CM | POA: Diagnosis not present

## 2018-06-20 DIAGNOSIS — K210 Gastro-esophageal reflux disease with esophagitis: Secondary | ICD-10-CM | POA: Diagnosis not present

## 2018-06-22 DIAGNOSIS — G894 Chronic pain syndrome: Secondary | ICD-10-CM | POA: Diagnosis not present

## 2018-06-22 DIAGNOSIS — Z6821 Body mass index (BMI) 21.0-21.9, adult: Secondary | ICD-10-CM | POA: Diagnosis not present

## 2018-06-22 DIAGNOSIS — R201 Hypoesthesia of skin: Secondary | ICD-10-CM | POA: Diagnosis not present

## 2018-06-22 DIAGNOSIS — K859 Acute pancreatitis without necrosis or infection, unspecified: Secondary | ICD-10-CM | POA: Diagnosis not present

## 2018-06-22 DIAGNOSIS — E1140 Type 2 diabetes mellitus with diabetic neuropathy, unspecified: Secondary | ICD-10-CM | POA: Diagnosis not present

## 2018-06-22 DIAGNOSIS — E119 Type 2 diabetes mellitus without complications: Secondary | ICD-10-CM | POA: Diagnosis not present

## 2018-06-22 DIAGNOSIS — Z9114 Patient's other noncompliance with medication regimen: Secondary | ICD-10-CM | POA: Diagnosis not present

## 2018-07-16 DIAGNOSIS — J449 Chronic obstructive pulmonary disease, unspecified: Secondary | ICD-10-CM | POA: Diagnosis not present

## 2018-07-16 DIAGNOSIS — J9610 Chronic respiratory failure, unspecified whether with hypoxia or hypercapnia: Secondary | ICD-10-CM | POA: Diagnosis not present

## 2018-07-20 DIAGNOSIS — J449 Chronic obstructive pulmonary disease, unspecified: Secondary | ICD-10-CM | POA: Diagnosis not present

## 2018-07-25 DIAGNOSIS — Z719 Counseling, unspecified: Secondary | ICD-10-CM | POA: Diagnosis not present

## 2018-07-25 DIAGNOSIS — G894 Chronic pain syndrome: Secondary | ICD-10-CM | POA: Diagnosis not present

## 2018-07-25 DIAGNOSIS — Z6820 Body mass index (BMI) 20.0-20.9, adult: Secondary | ICD-10-CM | POA: Diagnosis not present

## 2018-07-29 ENCOUNTER — Other Ambulatory Visit: Payer: Self-pay | Admitting: Internal Medicine

## 2018-07-29 DIAGNOSIS — Z1231 Encounter for screening mammogram for malignant neoplasm of breast: Secondary | ICD-10-CM

## 2018-08-06 ENCOUNTER — Other Ambulatory Visit: Payer: Self-pay

## 2018-08-16 DIAGNOSIS — J9610 Chronic respiratory failure, unspecified whether with hypoxia or hypercapnia: Secondary | ICD-10-CM | POA: Diagnosis not present

## 2018-08-16 DIAGNOSIS — J449 Chronic obstructive pulmonary disease, unspecified: Secondary | ICD-10-CM | POA: Diagnosis not present

## 2018-08-20 DIAGNOSIS — J449 Chronic obstructive pulmonary disease, unspecified: Secondary | ICD-10-CM | POA: Diagnosis not present

## 2018-08-30 ENCOUNTER — Ambulatory Visit: Payer: Self-pay

## 2018-08-30 DIAGNOSIS — G894 Chronic pain syndrome: Secondary | ICD-10-CM | POA: Diagnosis not present

## 2018-08-30 DIAGNOSIS — E1165 Type 2 diabetes mellitus with hyperglycemia: Secondary | ICD-10-CM | POA: Diagnosis not present

## 2018-08-30 DIAGNOSIS — R1013 Epigastric pain: Secondary | ICD-10-CM | POA: Diagnosis not present

## 2018-08-30 DIAGNOSIS — E063 Autoimmune thyroiditis: Secondary | ICD-10-CM | POA: Diagnosis not present

## 2018-08-31 ENCOUNTER — Ambulatory Visit
Admission: RE | Admit: 2018-08-31 | Discharge: 2018-08-31 | Disposition: A | Discharge status: Home / Self Care | Payer: Medicare Other | Source: Ambulatory Visit | Attending: Neurology | Admitting: Neurology

## 2018-08-31 DIAGNOSIS — M4804 Spinal stenosis, thoracic region: Secondary | ICD-10-CM | POA: Diagnosis not present

## 2018-08-31 DIAGNOSIS — R292 Abnormal reflex: Secondary | ICD-10-CM

## 2018-09-01 ENCOUNTER — Other Ambulatory Visit: Payer: Self-pay | Admitting: *Deleted

## 2018-09-01 ENCOUNTER — Telehealth: Payer: Self-pay | Admitting: *Deleted

## 2018-09-01 DIAGNOSIS — M5442 Lumbago with sciatica, left side: Secondary | ICD-10-CM

## 2018-09-01 DIAGNOSIS — M4306 Spondylolysis, lumbar region: Secondary | ICD-10-CM

## 2018-09-01 DIAGNOSIS — R292 Abnormal reflex: Secondary | ICD-10-CM

## 2018-09-01 DIAGNOSIS — M4804 Spinal stenosis, thoracic region: Secondary | ICD-10-CM

## 2018-09-01 DIAGNOSIS — G8929 Other chronic pain: Secondary | ICD-10-CM

## 2018-09-01 NOTE — Telephone Encounter (Signed)
-----   Message from Alda Berthold, DO sent at 09/01/2018  1:36 PM EST ----- Please inform patient that her MRI thoracic spine shows old T12 compression fracture and disc bulge in the mid-back at T6-7, similar to her imaging from 2014.  It does not appear that this has got worse, but could contribute to her leg pain.  Recommend starting physical therapy, otherwise, we can refer for epidural injection.

## 2018-09-01 NOTE — Telephone Encounter (Signed)
Results given and referral sent to [facility].

## 2018-09-02 DIAGNOSIS — K219 Gastro-esophageal reflux disease without esophagitis: Secondary | ICD-10-CM | POA: Diagnosis not present

## 2018-09-02 DIAGNOSIS — J449 Chronic obstructive pulmonary disease, unspecified: Secondary | ICD-10-CM | POA: Diagnosis not present

## 2018-09-02 DIAGNOSIS — E039 Hypothyroidism, unspecified: Secondary | ICD-10-CM | POA: Diagnosis not present

## 2018-09-02 DIAGNOSIS — Z7984 Long term (current) use of oral hypoglycemic drugs: Secondary | ICD-10-CM | POA: Diagnosis not present

## 2018-09-02 DIAGNOSIS — E119 Type 2 diabetes mellitus without complications: Secondary | ICD-10-CM | POA: Diagnosis not present

## 2018-09-02 DIAGNOSIS — Z87891 Personal history of nicotine dependence: Secondary | ICD-10-CM | POA: Diagnosis not present

## 2018-09-02 DIAGNOSIS — K861 Other chronic pancreatitis: Secondary | ICD-10-CM | POA: Diagnosis not present

## 2018-09-13 DIAGNOSIS — M6281 Muscle weakness (generalized): Secondary | ICD-10-CM | POA: Diagnosis not present

## 2018-09-13 DIAGNOSIS — M545 Low back pain: Secondary | ICD-10-CM | POA: Diagnosis not present

## 2018-09-13 DIAGNOSIS — M546 Pain in thoracic spine: Secondary | ICD-10-CM | POA: Diagnosis not present

## 2018-09-13 DIAGNOSIS — M5416 Radiculopathy, lumbar region: Secondary | ICD-10-CM | POA: Diagnosis not present

## 2018-09-14 DIAGNOSIS — M6281 Muscle weakness (generalized): Secondary | ICD-10-CM | POA: Diagnosis not present

## 2018-09-14 DIAGNOSIS — M5416 Radiculopathy, lumbar region: Secondary | ICD-10-CM | POA: Diagnosis not present

## 2018-09-14 DIAGNOSIS — G8929 Other chronic pain: Secondary | ICD-10-CM | POA: Diagnosis not present

## 2018-09-14 DIAGNOSIS — M546 Pain in thoracic spine: Secondary | ICD-10-CM | POA: Diagnosis not present

## 2018-09-14 DIAGNOSIS — Z6820 Body mass index (BMI) 20.0-20.9, adult: Secondary | ICD-10-CM | POA: Diagnosis not present

## 2018-09-14 DIAGNOSIS — M545 Low back pain: Secondary | ICD-10-CM | POA: Diagnosis not present

## 2018-09-14 DIAGNOSIS — G894 Chronic pain syndrome: Secondary | ICD-10-CM | POA: Diagnosis not present

## 2018-09-14 DIAGNOSIS — Z0000 Encounter for general adult medical examination without abnormal findings: Secondary | ICD-10-CM | POA: Diagnosis not present

## 2018-09-14 DIAGNOSIS — Z1389 Encounter for screening for other disorder: Secondary | ICD-10-CM | POA: Diagnosis not present

## 2018-09-16 DIAGNOSIS — M546 Pain in thoracic spine: Secondary | ICD-10-CM | POA: Diagnosis not present

## 2018-09-16 DIAGNOSIS — J9610 Chronic respiratory failure, unspecified whether with hypoxia or hypercapnia: Secondary | ICD-10-CM | POA: Diagnosis not present

## 2018-09-16 DIAGNOSIS — M545 Low back pain: Secondary | ICD-10-CM | POA: Diagnosis not present

## 2018-09-16 DIAGNOSIS — M6281 Muscle weakness (generalized): Secondary | ICD-10-CM | POA: Diagnosis not present

## 2018-09-16 DIAGNOSIS — M5416 Radiculopathy, lumbar region: Secondary | ICD-10-CM | POA: Diagnosis not present

## 2018-09-16 DIAGNOSIS — J449 Chronic obstructive pulmonary disease, unspecified: Secondary | ICD-10-CM | POA: Diagnosis not present

## 2018-09-20 ENCOUNTER — Telehealth: Payer: Self-pay | Admitting: Neurology

## 2018-09-20 DIAGNOSIS — J449 Chronic obstructive pulmonary disease, unspecified: Secondary | ICD-10-CM | POA: Diagnosis not present

## 2018-09-20 DIAGNOSIS — M6281 Muscle weakness (generalized): Secondary | ICD-10-CM | POA: Diagnosis not present

## 2018-09-20 DIAGNOSIS — M546 Pain in thoracic spine: Secondary | ICD-10-CM | POA: Diagnosis not present

## 2018-09-20 DIAGNOSIS — M5416 Radiculopathy, lumbar region: Secondary | ICD-10-CM | POA: Diagnosis not present

## 2018-09-20 DIAGNOSIS — M545 Low back pain: Secondary | ICD-10-CM | POA: Diagnosis not present

## 2018-09-20 NOTE — Telephone Encounter (Signed)
Called patient to let her know that I will send referral but mailbox is full.

## 2018-09-20 NOTE — Telephone Encounter (Signed)
See the 09/01/2018 note and please advise.

## 2018-09-20 NOTE — Telephone Encounter (Signed)
Patient left vm about having shots in her back. Her therapist suggested them and wanted to talk to you about it. Please call her back at [phone]. Thanks!

## 2018-09-20 NOTE — Telephone Encounter (Signed)
If pain is no better with therapy, I also recommend epidural injections to her back. OK to refer to Dr. Maryjean Ka with [facility].

## 2018-09-22 ENCOUNTER — Telehealth: Payer: Self-pay | Admitting: Neurology

## 2018-09-22 NOTE — Telephone Encounter (Signed)
Patient made aware and is agreeable to this plan.   [name] - wanted to make you aware patient is agreeable to see MD for back injections and you can send referral (previous message states you could not reach her).

## 2018-09-22 NOTE — Telephone Encounter (Signed)
OK to hold on PT for now.  We referred her to Dr. Maryjean Ka at [facility] for back injections, we can wait and see how she does with this.

## 2018-09-22 NOTE — Telephone Encounter (Signed)
Received after hours nursing note stating, " Caller states she had a fall and wasn't able to go to therapy today. She was told to call into the office for instructions on what to do." I called patient back [phone]) to discuss. She states she has had numerous falls in the past few weeks. No injuries other than bruises and cuts. She states no LOC/did not hit her head with this fall. She did have a knot of her hip before the fall and after she is having some increased pain in the hip area. She feels like her leg numbness is contributing to falls. She states PT wanted the doctor's approval for her to come back to therapy.  Dr. Posey Pronto - please advise.

## 2018-09-26 NOTE — Telephone Encounter (Signed)
Referral sent 

## 2018-09-27 ENCOUNTER — Telehealth: Payer: Self-pay | Admitting: *Deleted

## 2018-09-27 NOTE — Telephone Encounter (Signed)
Appointment with Dr. Maryjean Ka on 10/19/2018 at 2:30.

## 2018-10-15 DIAGNOSIS — J449 Chronic obstructive pulmonary disease, unspecified: Secondary | ICD-10-CM | POA: Diagnosis not present

## 2018-10-15 DIAGNOSIS — J9610 Chronic respiratory failure, unspecified whether with hypoxia or hypercapnia: Secondary | ICD-10-CM | POA: Diagnosis not present

## 2018-10-19 DIAGNOSIS — J449 Chronic obstructive pulmonary disease, unspecified: Secondary | ICD-10-CM | POA: Diagnosis not present

## 2018-11-01 DIAGNOSIS — G894 Chronic pain syndrome: Secondary | ICD-10-CM | POA: Diagnosis not present

## 2018-11-15 DIAGNOSIS — J449 Chronic obstructive pulmonary disease, unspecified: Secondary | ICD-10-CM | POA: Diagnosis not present

## 2018-11-15 DIAGNOSIS — J9610 Chronic respiratory failure, unspecified whether with hypoxia or hypercapnia: Secondary | ICD-10-CM | POA: Diagnosis not present

## 2018-11-17 DIAGNOSIS — R1010 Upper abdominal pain, unspecified: Secondary | ICD-10-CM | POA: Diagnosis not present

## 2018-11-17 DIAGNOSIS — R1013 Epigastric pain: Secondary | ICD-10-CM | POA: Diagnosis not present

## 2018-11-17 DIAGNOSIS — R109 Unspecified abdominal pain: Secondary | ICD-10-CM | POA: Diagnosis not present

## 2018-11-17 DIAGNOSIS — I10 Essential (primary) hypertension: Secondary | ICD-10-CM | POA: Diagnosis not present

## 2018-11-17 DIAGNOSIS — E119 Type 2 diabetes mellitus without complications: Secondary | ICD-10-CM | POA: Diagnosis not present

## 2018-11-17 DIAGNOSIS — R112 Nausea with vomiting, unspecified: Secondary | ICD-10-CM | POA: Diagnosis not present

## 2018-11-17 DIAGNOSIS — K861 Other chronic pancreatitis: Secondary | ICD-10-CM | POA: Diagnosis not present

## 2018-11-17 DIAGNOSIS — E876 Hypokalemia: Secondary | ICD-10-CM | POA: Diagnosis not present

## 2018-11-17 DIAGNOSIS — J449 Chronic obstructive pulmonary disease, unspecified: Secondary | ICD-10-CM | POA: Diagnosis not present

## 2018-11-17 DIAGNOSIS — Z7984 Long term (current) use of oral hypoglycemic drugs: Secondary | ICD-10-CM | POA: Diagnosis not present

## 2018-11-17 DIAGNOSIS — Z87891 Personal history of nicotine dependence: Secondary | ICD-10-CM | POA: Diagnosis not present

## 2018-11-17 DIAGNOSIS — R1012 Left upper quadrant pain: Secondary | ICD-10-CM | POA: Diagnosis not present

## 2018-11-17 DIAGNOSIS — Z79899 Other long term (current) drug therapy: Secondary | ICD-10-CM | POA: Diagnosis not present

## 2018-11-19 DIAGNOSIS — J449 Chronic obstructive pulmonary disease, unspecified: Secondary | ICD-10-CM | POA: Diagnosis not present

## 2018-11-24 DIAGNOSIS — K861 Other chronic pancreatitis: Secondary | ICD-10-CM | POA: Diagnosis not present

## 2018-11-24 DIAGNOSIS — G894 Chronic pain syndrome: Secondary | ICD-10-CM | POA: Diagnosis not present

## 2018-12-15 DIAGNOSIS — J441 Chronic obstructive pulmonary disease with (acute) exacerbation: Secondary | ICD-10-CM | POA: Diagnosis not present

## 2018-12-15 DIAGNOSIS — J9610 Chronic respiratory failure, unspecified whether with hypoxia or hypercapnia: Secondary | ICD-10-CM | POA: Diagnosis not present

## 2018-12-15 DIAGNOSIS — J449 Chronic obstructive pulmonary disease, unspecified: Secondary | ICD-10-CM | POA: Diagnosis not present

## 2018-12-15 DIAGNOSIS — D3132 Benign neoplasm of left choroid: Secondary | ICD-10-CM | POA: Diagnosis not present

## 2018-12-15 DIAGNOSIS — E441 Mild protein-calorie malnutrition: Secondary | ICD-10-CM | POA: Diagnosis not present

## 2018-12-15 DIAGNOSIS — K861 Other chronic pancreatitis: Secondary | ICD-10-CM | POA: Diagnosis not present

## 2018-12-23 DIAGNOSIS — Z1159 Encounter for screening for other viral diseases: Secondary | ICD-10-CM | POA: Diagnosis not present

## 2018-12-26 DIAGNOSIS — K861 Other chronic pancreatitis: Secondary | ICD-10-CM | POA: Diagnosis not present

## 2018-12-26 DIAGNOSIS — G894 Chronic pain syndrome: Secondary | ICD-10-CM | POA: Diagnosis not present

## 2018-12-26 HISTORY — PX: EUS: SHX5427

## 2018-12-29 DIAGNOSIS — M85852 Other specified disorders of bone density and structure, left thigh: Secondary | ICD-10-CM | POA: Diagnosis not present

## 2018-12-30 DIAGNOSIS — K210 Gastro-esophageal reflux disease with esophagitis: Secondary | ICD-10-CM | POA: Diagnosis not present

## 2018-12-30 DIAGNOSIS — K449 Diaphragmatic hernia without obstruction or gangrene: Secondary | ICD-10-CM | POA: Diagnosis not present

## 2018-12-30 DIAGNOSIS — K861 Other chronic pancreatitis: Secondary | ICD-10-CM | POA: Diagnosis not present

## 2018-12-30 DIAGNOSIS — K2960 Other gastritis without bleeding: Secondary | ICD-10-CM | POA: Diagnosis not present

## 2018-12-30 DIAGNOSIS — G8929 Other chronic pain: Secondary | ICD-10-CM | POA: Diagnosis not present

## 2018-12-30 DIAGNOSIS — K2950 Unspecified chronic gastritis without bleeding: Secondary | ICD-10-CM | POA: Diagnosis not present

## 2018-12-30 DIAGNOSIS — K269 Duodenal ulcer, unspecified as acute or chronic, without hemorrhage or perforation: Secondary | ICD-10-CM | POA: Diagnosis not present

## 2019-01-12 DIAGNOSIS — J449 Chronic obstructive pulmonary disease, unspecified: Secondary | ICD-10-CM | POA: Diagnosis not present

## 2019-01-12 DIAGNOSIS — Z0001 Encounter for general adult medical examination with abnormal findings: Secondary | ICD-10-CM | POA: Diagnosis not present

## 2019-01-12 DIAGNOSIS — G894 Chronic pain syndrome: Secondary | ICD-10-CM | POA: Diagnosis not present

## 2019-01-12 DIAGNOSIS — Z1389 Encounter for screening for other disorder: Secondary | ICD-10-CM | POA: Diagnosis not present

## 2019-01-12 DIAGNOSIS — G629 Polyneuropathy, unspecified: Secondary | ICD-10-CM | POA: Diagnosis not present

## 2019-01-12 DIAGNOSIS — Z0000 Encounter for general adult medical examination without abnormal findings: Secondary | ICD-10-CM | POA: Diagnosis not present

## 2019-01-12 DIAGNOSIS — E1140 Type 2 diabetes mellitus with diabetic neuropathy, unspecified: Secondary | ICD-10-CM | POA: Diagnosis not present

## 2019-01-12 DIAGNOSIS — E063 Autoimmune thyroiditis: Secondary | ICD-10-CM | POA: Diagnosis not present

## 2019-01-15 DIAGNOSIS — J9610 Chronic respiratory failure, unspecified whether with hypoxia or hypercapnia: Secondary | ICD-10-CM | POA: Diagnosis not present

## 2019-01-15 DIAGNOSIS — J449 Chronic obstructive pulmonary disease, unspecified: Secondary | ICD-10-CM | POA: Diagnosis not present

## 2019-01-17 DIAGNOSIS — M5410 Radiculopathy, site unspecified: Secondary | ICD-10-CM | POA: Diagnosis not present

## 2019-01-17 DIAGNOSIS — M7918 Myalgia, other site: Secondary | ICD-10-CM | POA: Diagnosis not present

## 2019-01-17 DIAGNOSIS — M5134 Other intervertebral disc degeneration, thoracic region: Secondary | ICD-10-CM | POA: Diagnosis not present

## 2019-01-19 DIAGNOSIS — J449 Chronic obstructive pulmonary disease, unspecified: Secondary | ICD-10-CM | POA: Diagnosis not present

## 2019-02-03 DIAGNOSIS — Z9981 Dependence on supplemental oxygen: Secondary | ICD-10-CM | POA: Diagnosis not present

## 2019-02-03 DIAGNOSIS — K861 Other chronic pancreatitis: Secondary | ICD-10-CM | POA: Diagnosis not present

## 2019-02-03 DIAGNOSIS — E039 Hypothyroidism, unspecified: Secondary | ICD-10-CM | POA: Diagnosis not present

## 2019-02-03 DIAGNOSIS — E119 Type 2 diabetes mellitus without complications: Secondary | ICD-10-CM | POA: Diagnosis not present

## 2019-02-03 DIAGNOSIS — J159 Unspecified bacterial pneumonia: Secondary | ICD-10-CM | POA: Diagnosis not present

## 2019-02-03 DIAGNOSIS — Z87891 Personal history of nicotine dependence: Secondary | ICD-10-CM | POA: Diagnosis not present

## 2019-02-03 DIAGNOSIS — J440 Chronic obstructive pulmonary disease with acute lower respiratory infection: Secondary | ICD-10-CM | POA: Diagnosis not present

## 2019-02-03 DIAGNOSIS — Z882 Allergy status to sulfonamides status: Secondary | ICD-10-CM | POA: Diagnosis not present

## 2019-02-03 DIAGNOSIS — I10 Essential (primary) hypertension: Secondary | ICD-10-CM | POA: Diagnosis not present

## 2019-02-03 DIAGNOSIS — Z7984 Long term (current) use of oral hypoglycemic drugs: Secondary | ICD-10-CM | POA: Diagnosis not present

## 2019-02-03 DIAGNOSIS — K625 Hemorrhage of anus and rectum: Secondary | ICD-10-CM | POA: Diagnosis not present

## 2019-02-03 DIAGNOSIS — K649 Unspecified hemorrhoids: Secondary | ICD-10-CM | POA: Diagnosis not present

## 2019-02-03 DIAGNOSIS — J189 Pneumonia, unspecified organism: Secondary | ICD-10-CM | POA: Diagnosis not present

## 2019-02-03 DIAGNOSIS — Z880 Allergy status to penicillin: Secondary | ICD-10-CM | POA: Diagnosis not present

## 2019-02-03 DIAGNOSIS — E1140 Type 2 diabetes mellitus with diabetic neuropathy, unspecified: Secondary | ICD-10-CM | POA: Diagnosis not present

## 2019-02-04 DIAGNOSIS — K861 Other chronic pancreatitis: Secondary | ICD-10-CM | POA: Diagnosis not present

## 2019-02-04 DIAGNOSIS — E039 Hypothyroidism, unspecified: Secondary | ICD-10-CM | POA: Diagnosis not present

## 2019-02-04 DIAGNOSIS — Z882 Allergy status to sulfonamides status: Secondary | ICD-10-CM | POA: Diagnosis not present

## 2019-02-04 DIAGNOSIS — J159 Unspecified bacterial pneumonia: Secondary | ICD-10-CM | POA: Diagnosis not present

## 2019-02-04 DIAGNOSIS — K649 Unspecified hemorrhoids: Secondary | ICD-10-CM | POA: Diagnosis not present

## 2019-02-04 DIAGNOSIS — Z87891 Personal history of nicotine dependence: Secondary | ICD-10-CM | POA: Diagnosis not present

## 2019-02-04 DIAGNOSIS — E1140 Type 2 diabetes mellitus with diabetic neuropathy, unspecified: Secondary | ICD-10-CM | POA: Diagnosis not present

## 2019-02-04 DIAGNOSIS — Z7984 Long term (current) use of oral hypoglycemic drugs: Secondary | ICD-10-CM | POA: Diagnosis not present

## 2019-02-04 DIAGNOSIS — J440 Chronic obstructive pulmonary disease with acute lower respiratory infection: Secondary | ICD-10-CM | POA: Diagnosis not present

## 2019-02-04 DIAGNOSIS — I10 Essential (primary) hypertension: Secondary | ICD-10-CM | POA: Diagnosis not present

## 2019-02-04 DIAGNOSIS — Z880 Allergy status to penicillin: Secondary | ICD-10-CM | POA: Diagnosis not present

## 2019-02-04 DIAGNOSIS — Z9981 Dependence on supplemental oxygen: Secondary | ICD-10-CM | POA: Diagnosis not present

## 2019-02-05 DIAGNOSIS — J189 Pneumonia, unspecified organism: Secondary | ICD-10-CM | POA: Diagnosis not present

## 2019-02-05 DIAGNOSIS — I10 Essential (primary) hypertension: Secondary | ICD-10-CM | POA: Diagnosis not present

## 2019-02-05 DIAGNOSIS — K649 Unspecified hemorrhoids: Secondary | ICD-10-CM | POA: Diagnosis not present

## 2019-02-05 DIAGNOSIS — K861 Other chronic pancreatitis: Secondary | ICD-10-CM | POA: Diagnosis not present

## 2019-02-05 DIAGNOSIS — E1140 Type 2 diabetes mellitus with diabetic neuropathy, unspecified: Secondary | ICD-10-CM | POA: Diagnosis not present

## 2019-02-05 DIAGNOSIS — Z7984 Long term (current) use of oral hypoglycemic drugs: Secondary | ICD-10-CM | POA: Diagnosis not present

## 2019-02-05 DIAGNOSIS — E119 Type 2 diabetes mellitus without complications: Secondary | ICD-10-CM | POA: Diagnosis not present

## 2019-02-05 DIAGNOSIS — Z87891 Personal history of nicotine dependence: Secondary | ICD-10-CM | POA: Diagnosis not present

## 2019-02-05 DIAGNOSIS — J159 Unspecified bacterial pneumonia: Secondary | ICD-10-CM | POA: Diagnosis not present

## 2019-02-05 DIAGNOSIS — Z882 Allergy status to sulfonamides status: Secondary | ICD-10-CM | POA: Diagnosis not present

## 2019-02-05 DIAGNOSIS — Z880 Allergy status to penicillin: Secondary | ICD-10-CM | POA: Diagnosis not present

## 2019-02-05 DIAGNOSIS — J440 Chronic obstructive pulmonary disease with acute lower respiratory infection: Secondary | ICD-10-CM | POA: Diagnosis not present

## 2019-02-05 DIAGNOSIS — E039 Hypothyroidism, unspecified: Secondary | ICD-10-CM | POA: Diagnosis not present

## 2019-02-05 DIAGNOSIS — Z9981 Dependence on supplemental oxygen: Secondary | ICD-10-CM | POA: Diagnosis not present

## 2019-02-14 DIAGNOSIS — J9610 Chronic respiratory failure, unspecified whether with hypoxia or hypercapnia: Secondary | ICD-10-CM | POA: Diagnosis not present

## 2019-02-14 DIAGNOSIS — J449 Chronic obstructive pulmonary disease, unspecified: Secondary | ICD-10-CM | POA: Diagnosis not present

## 2019-02-16 ENCOUNTER — Encounter: Payer: Self-pay | Admitting: *Deleted

## 2019-02-16 ENCOUNTER — Other Ambulatory Visit: Payer: Self-pay | Admitting: *Deleted

## 2019-02-16 DIAGNOSIS — J441 Chronic obstructive pulmonary disease with (acute) exacerbation: Secondary | ICD-10-CM

## 2019-02-16 NOTE — Patient Outreach (Signed)
Referral received from [employer] high risk list, outreach call to pt for screening, spoke with pt, HIPAA verified, pt reports she lives alone and has assistance of her 2 adult sons, pt still drives and is independent in most aspects of her care, pt states she sees a neurologist but cannot remember his name, has been to behavioral health in the past (years ago) and was discharged, pt feels due to depression she could benefit from outpatient counseling/ therapy again and would like [facility] CSW to contact her for resources or possible counseling over the phone.  Pt reports she was in hospital 02/06/19 [facility] for pneumonia, Primary MD completes transition of care, Pt requests COPD booklet and would like to read for further information.  Pt has oxygen, Bipap and nebulizer but does not know the name of medication for this as she threw the boxes away.  RN CM reviewed medications with pt, she has prefilled packs from [place],  Pt reports she has dizziness at times and this maybe contributed to frequent falls, pt declines [facility] pharmacist services for additional support related to possible side effects from some of the medications, pt states she has close relationship with her local pharmacist and has already discussed with him and is aware of the side effects of her medication.  Pt states she does not have a walker and does not want one, citing she is "too young for that and will not use it"  Pt interested in outpatient PT for strengthening and has been there in the past.  Pt has chronic pain and attends pain clinic and receives injections in her back, has narcotic pain medication to take.  Pt has not lost weight recently but has lost approximately 60 pounds over the course of a year.  Pt wanted to lose some weight but seems to have lost too much related to appetite is not good at times.  RN CM reviewed importance of 3 meals per day plus snacks with adequate protein and calories, pt has discussed with MD.   RN CM faxed barrier letter and today's visit note to primary MD- reported depression screen PHQ9=9 and social work will be involved, requested order for outpatient PT due to high risk for falls (pt not candidate for home health due to not homebound).  Outpatient Encounter Medications as of 02/16/2019  Medication Sig  . albuterol (VENTOLIN HFA) 108 (90 Base) MCG/ACT inhaler Inhale into the lungs every 6 (six) hours as needed for wheezing or shortness of breath.  Marland Kitchen amitriptyline (ELAVIL) 100 MG tablet Take 100 mg by mouth at bedtime.  Marland Kitchen atorvastatin (LIPITOR) 20 MG tablet Take 20 mg by mouth daily.  . busPIRone (BUSPAR) 10 MG tablet Take 10 mg by mouth 3 (three) times daily.  . citalopram (CELEXA) 20 MG tablet Take 20 mg by mouth daily.  . furosemide (LASIX) 20 MG tablet Take 20 mg by mouth.  . levothyroxine (SYNTHROID) 25 MCG tablet Take 25 mcg by mouth daily before breakfast.  . lipase/protease/amylase (CREON) 12000 units CPEP capsule Take 2,000 Units by mouth 3 (three) times daily before meals.  Marland Kitchen lisinopril (ZESTRIL) 2.5 MG tablet Take 2.5 mg by mouth daily.  . metFORMIN (GLUCOPHAGE) 500 MG tablet Take by mouth 4 (four) times daily.  . methocarbamol (ROBAXIN) 500 MG tablet Take 500 mg by mouth 3 (three) times daily.  Marland Kitchen oxycodone (ROXICODONE) 30 MG immediate release tablet Take 30 mg by mouth every 8 (eight) hours as needed for pain.  . valACYclovir (VALTREX)  500 MG tablet Take 500 mg by mouth daily.   No facility-administered encounter medications on file as of 02/16/2019.     THN CM Care Plan Problem One     Most Recent Value  Care Plan Problem One  Knowledge deficit related to COPD/ recent pneumonia  Role Documenting the Problem One  Care Management Coordinator  Care Plan for Problem One  Active  THN Long Term Goal   Pt will verbalize improvement in self care related to COPD/ pneumonia within 60 days  THN Long Term Goal Start Date  02/16/19  Interventions for Problem One Long Term Goal   RN CM established and reviewed plan of care/ goals with pt, mailed successful outreach letter to pt home including consent form, 24 hour nurse line magnet, pamphlet, COPD booklet, face masks  THN CM Short Term Goal #1   Pt will verbalize COPD action plan within 30 days  THN CM Short Term Goal #1 Start Date  02/16/19  Interventions for Short Term Goal #1  RN CM mailed COPD booklet with copy of action plan and reviewed action plan with pt, pt states she is in green zone today, reviewed importance of wearing face covering, social distancing and handwashing    THN CM Care Plan Problem Two     Most Recent Value  Care Plan Problem Two  HIgh risk for falls  Role Documenting the Problem Two  Care Management Coordinator  Care Plan for Problem Two  Active  THN CM Short Term Goal #1   Pt will have appointment for outpatient PT within 30 days  THN CM Short Term Goal #1 Start Date  02/16/19  Interventions for Short Term Goal #2   RN CM discussed with pt the benefit of exercise and outpt PT, pt is interested,  RN CM requested primary care MD order outpatient PT for pt due to frequent falls, decreased endurance and fatigue  THN CM Short Term Goal #2   Pt will begin yoga routine at home within 30 days  THN CM Short Term Goal #2 Start Date  02/16/19  Interventions for Short Term Goal #2  Pt purchased a yoga mat and plans to begin home yoga routine with videos (as encouraged by neurologist per pt),  RN CM encouraged pt to start slowly and and not overdo or over stretch, warm up beforehand.  THN CM Short Term Goal #3   Pt will verbalize safety precautions within 30 days  THN CM Short Term Goal #3 Start Date  02/16/19  Interventions for Short Term Goal #3  RN CM reviewed safety precautions, importance of not ambulating if dizzy and asking for assistance as needed, reviewed side effects of some of her medications- dizziness, drowsiness.    THN CM Care Plan Problem Three     Most Recent Value  Care Plan Problem  Three  Decreased quality of life related to depression  Role Documenting the Problem Three  Care Management Coordinator  Care Plan for Problem Three  Active  THN CM Short Term Goal #1   Pt will work with [facility] CSW fo resources/ support related to depression within 30 days  THN CM Short Term Goal #1 Start Date  02/16/19  Interventions for Short Term Goal #1  RN CM placed order for [facility] CSW to outreach pt for support/ resources related to depression,  RN CM reviewed importance of taking antidepessant medication as prescribed, discussing depression with MD, RN CM reported score for depression screening to primary MD via  letter, ask pt to try relaxation and reduce stress if possible.      PLAN Outreach pt next week for follow up on outpatient PT order Collaborate with [facility] CSW as needed r/t depression  Jacqlyn Larsen [facility], [place] Coordinator [phone]

## 2019-02-18 DIAGNOSIS — J449 Chronic obstructive pulmonary disease, unspecified: Secondary | ICD-10-CM | POA: Diagnosis not present

## 2019-02-20 ENCOUNTER — Encounter: Payer: Self-pay | Admitting: Neurology

## 2019-02-20 DIAGNOSIS — G894 Chronic pain syndrome: Secondary | ICD-10-CM | POA: Diagnosis not present

## 2019-02-24 ENCOUNTER — Other Ambulatory Visit: Payer: Self-pay | Admitting: *Deleted

## 2019-02-24 ENCOUNTER — Encounter: Payer: Self-pay | Admitting: *Deleted

## 2019-02-24 NOTE — Patient Outreach (Signed)
[facility]) Care Management  02/24/2019  Mikaella P Lockridge 1970-09-23 [id number]   CSW made initial contact with pt and confirmed identity. CSW introduced self, role and reason for call; referral for depression and resources for counseling.  Pt shared with CSW that she is currently not feeling depressed however states, "I have Bipolar" and was hospitalized most recently about 5 years ago. She denies any current SI/HI and is on medication for depression.  She is aware of some "triggers" that seem to escalate her sadness and depression/mood; physical pain, a son in the [employer] and other family concerns.  She finds yoga is a good outlet.  Pt is open to seeing a counselor/therapist and prefers someone local in the [place] area.  CSW will investigate resources she can consider and plan a mailing and follow up call to pt to further assist and support.   Eduard Clos, MSW, [place] Worker  [place] [phone]

## 2019-02-24 NOTE — Patient Outreach (Signed)
Outreach call to pt for follow up on outpatient PT, pt states MD did send the order and outpatient in [place] contacted her and pt refused citing she wants to attend in [place], so MD is sending the order to [facility] and pt is awaiting their call.  Pt states no issues with CBG.  States " I can't go to outpatient PT right now anyway because my lower legs and feet have been swollen for 3 days"  Pt states her shoes don't fit well and it hurts to walk, pt states she has not contacted primary MD. Pt denies dyspnea or other symptoms. Pt states she has been trying to elevate her legs during the day and be mindful of sodium intake, pt is taking diuretic daily as prescribed. RN CM ask pt to contact MD today so they can assess her symptoms, pt states she will do so.  RN CM faxed letter to primary MD reporting LE edema x 3 days and called primary MD office, spoke with Terrence Dupont, reported edema x 3 days and RN CM encouraged pt to call.  Terrence Dupont will let MD know. RN CM reviewed medications per pt request as pt does not fully understand the purpose/ side effects of each medication.  RN CM completed PAM 10 statements with pt.   PLAN Outreach pt next week for follow up on edema  Jacqlyn Larsen [facility], [place] Coordinator [phone]

## 2019-03-01 ENCOUNTER — Encounter: Payer: Self-pay | Admitting: *Deleted

## 2019-03-01 ENCOUNTER — Other Ambulatory Visit: Payer: Self-pay | Admitting: *Deleted

## 2019-03-01 NOTE — Patient Outreach (Signed)
Outreach call to pt for telephone assessment, spoke with pt, HIPAA verified, pt reports "swelling in my feet is going down, it looks so much better" , primary MD office called pt to schedule telephone visit and pt declined citing swelling had improved.  Pt reports she continues checking CBG several times daily with FBS 99-140 and RBS 120-140, Pt reports [employer] contacted her and "is sending someone out here to check on me, they do this once a year"  Pt is ready to go to outpatient PT (see care plan, MD contacted)  [facility] CM Care Plan Problem One     Most Recent Value  Care Plan Problem One  Knowledge deficit related to COPD/ recent pneumonia  Role Documenting the Problem One  Care Management Coordinator  Care Plan for Problem One  Active  THN Long Term Goal   Pt will verbalize improvement in self care related to COPD/ pneumonia within 60 days  THN Long Term Goal Start Date  02/16/19  Interventions for Problem One Long Term Goal  RN CM reinforced plan of care with pt,  pt reports receipt of COPD booklet and has started looking through book and reading  THN CM Short Term Goal #1   Pt will verbalize COPD action plan within 30 days  THN CM Short Term Goal #1 Start Date  02/16/19  Interventions for Short Term Goal #1  RN CM reinforced COPD action plan, pt states she is in green zone today    [facility] CM Care Plan Problem Two     Most Recent Value  Care Plan Problem Two  HIgh risk for falls  Role Documenting the Problem Two  Care Management Coordinator  Care Plan for Problem Two  Active  THN CM Short Term Goal #1   Pt will have appointment for outpatient PT within 30 days  THN CM Short Term Goal #1 Start Date  02/16/19  Interventions for Short Term Goal #2   Pt states MD is going to order outpatient PT in [place] (per pt request) but pt has not heard from anyone, RN CM faxed note to primary MD office for follow up on order for PT and where order was sent  [facility] CM Short Term Goal #2   Pt will begin yoga  routine at home within 30 days  THN CM Short Term Goal #2 Start Date  02/16/19  Interventions for Short Term Goal #2  Pt has not begun yoga yet due to feet have been swollen, this has now resolved and pt plans to begin  [facility] CM Short Term Goal #3   Pt will verbalize safety precautions within 30 days  THN CM Short Term Goal #3 Start Date  02/16/19  Interventions for Short Term Goal #3  RN CM reinforced safety precautions      PLAN Follow up next week with pt about outpatient PT  Jacqlyn Larsen [facility], [place] Coordinator [phone]

## 2019-03-03 ENCOUNTER — Other Ambulatory Visit: Payer: Self-pay | Admitting: *Deleted

## 2019-03-03 ENCOUNTER — Encounter: Payer: Self-pay | Admitting: *Deleted

## 2019-03-03 DIAGNOSIS — K861 Other chronic pancreatitis: Secondary | ICD-10-CM | POA: Diagnosis not present

## 2019-03-03 DIAGNOSIS — J449 Chronic obstructive pulmonary disease, unspecified: Secondary | ICD-10-CM | POA: Diagnosis not present

## 2019-03-03 NOTE — Patient Outreach (Signed)
[place] [facility]) Care Management  03/03/2019  Brucha P Gowell 1971-01-28 [id number]   CSW spoke with pt who admits to "feeling down", stating her health issues are impacting her overall depression/mood.  She is willing to pursue outpatient therapy and CSW is linking her with several options in [place] area.  CSW offered support and encouragement; reminded her to call to schedule her pain clinic appointment also.  CSW will plan follow up call to pt in the next 2 weeks.  Eduard Clos, MSW, LCSW Clinical Social Worker  [place], MSW, [place] Worker  [place] [phone]

## 2019-03-10 ENCOUNTER — Other Ambulatory Visit: Payer: Self-pay | Admitting: *Deleted

## 2019-03-10 NOTE — Patient Outreach (Signed)
Outreach call to pt for follow up on outpatient PT, no answer to telephone and no option to leave voicemail. RN CM mailed unsuccessful outreach letter to pt home.  PLAN Outreach pt in 3-4 business days  Jacqlyn Larsen [facility], [place] [phone]

## 2019-03-14 ENCOUNTER — Other Ambulatory Visit: Payer: Self-pay | Admitting: *Deleted

## 2019-03-14 NOTE — Patient Outreach (Signed)
[facility]) Care Management  03/14/2019  [name] September 15, 1970 [id number]   CSW made contact with pt today who reports she is doing well. She has received the materials mailed to her for local mental health counseling but has not had time to review it.  Pt asked that I call back in a few days to discuss further and to give her time to review.  CSW will make outreach call in 3-4 business days.  Eduard Clos, MSW, [place] Worker  [place] [phone]

## 2019-03-15 ENCOUNTER — Other Ambulatory Visit: Payer: Self-pay | Admitting: *Deleted

## 2019-03-15 ENCOUNTER — Encounter: Payer: Self-pay | Admitting: *Deleted

## 2019-03-15 NOTE — Patient Outreach (Signed)
Outreach call to pt for follow up on outpatient PT, spoke with pt, HIPAA verified, pt reports she has not heard anything from outpatient but has not pursued checking into this any further due to feeling tired, expectorating yellowish phlegm despite just finishing antibiotic, pt states she was in contact with primary care MD and he felt she should take another round of antibiotics,  Pt is to call MD and follow up and let MD know how she is feeling and pt will discuss the outpatient PT option, pt is now doing yoga at home consistently.  Pt reports CBG is 107-150.  LE edema is resolved.  Pt reports she has been using Bipap nightly and states she has called company because her mask and tubing is over a year old,  RN CM questioned pt about cleaning mask and hose and pt states she never cleans them.  RN CM ask pt to clean the mask daily and dry thoroughly and clean the tubing at least once weekly and dry thoroughly.  Pt states she will begin today with cleaning the equipment (pt states there is not a chamber for water/ humidification).  Pt will call DME company again today to follow up on getting new mask and tubing.  RN CM ask pt to call with any questions, concerns.  THN CM Care Plan Problem One     Most Recent Value  Care Plan Problem One  Knowledge deficit related to COPD/ recent pneumonia  Role Documenting the Problem One  Care Management Coordinator  Care Plan for Problem One  Active  THN Long Term Goal   Pt will verbalize improvement in self care related to COPD/ pneumonia within 60 days  THN Long Term Goal Start Date  02/16/19  Interventions for Problem One Long Term Goal  RN CM reviewed plan of care with pt, ask pt to continue looking through COPD booklet, reviewed importance of cleaning Bipap equipment (mask daily and tubing at least weekly)  THN CM Short Term Goal #1   Pt will verbalize COPD action plan within 30 days  THN CM Short Term Goal #1 Start Date  03/15/19 [goal re-established]   Interventions for Short Term Goal #1  RN CM reviewed COPD action plan with pt, pt has some yellowish phlegm and is contacting primary MD today to update, pt just finished antibiotic    THN CM Care Plan Problem Two     Most Recent Value  Care Plan Problem Two  HIgh risk for falls  Role Documenting the Problem Two  Care Management Coordinator  Care Plan for Problem Two  Active  THN CM Short Term Goal #1   Pt will have appointment for outpatient PT within 30 days  THN CM Short Term Goal #1 Start Date  03/15/19 [goal re-established]  Interventions for Short Term Goal #2   Pt will speak with primary MD about referral  THN CM Short Term Goal #2   Pt will begin yoga routine at home within 30 days  THN CM Short Term Goal #2 Start Date  02/16/19  [facility] CM Short Term Goal #2 Met Date  03/15/19  Interventions for Short Term Goal #2  Pt reports she has begun yoga routine and trying to work on this daily  THN CM Short Term Goal #3   Pt will verbalize safety precautions within 30 days  THN CM Short Term Goal #3 Start Date  02/16/19  [facility] CM Short Term Goal #3 Met Date  03/15/19  Interventions for Short  Term Goal #3  RN CM reviewed safety precautions, pt denies any falls      PLAN Outreach pt for telephone assessment in 2 weeks  Jacqlyn Larsen [facility], [place] Coordinator [phone]

## 2019-03-16 DIAGNOSIS — J441 Chronic obstructive pulmonary disease with (acute) exacerbation: Secondary | ICD-10-CM | POA: Diagnosis not present

## 2019-03-17 ENCOUNTER — Other Ambulatory Visit: Payer: Self-pay | Admitting: *Deleted

## 2019-03-17 ENCOUNTER — Ambulatory Visit: Payer: Self-pay | Admitting: *Deleted

## 2019-03-17 DIAGNOSIS — J449 Chronic obstructive pulmonary disease, unspecified: Secondary | ICD-10-CM | POA: Diagnosis not present

## 2019-03-17 DIAGNOSIS — J9610 Chronic respiratory failure, unspecified whether with hypoxia or hypercapnia: Secondary | ICD-10-CM | POA: Diagnosis not present

## 2019-03-17 NOTE — Patient Outreach (Signed)
[place] [facility]) Care Management  03/17/2019  [name] 10/02/1970 [id number]   CSW attempted to reach pt by phone today and was unsuccessful.  CSW was unable to leave message due to full voicemail.  CSW will try again in 3-4 business days per outreach protocol.   Eduard Clos, MSW, [place] Worker  [place] [phone]

## 2019-03-21 DIAGNOSIS — J449 Chronic obstructive pulmonary disease, unspecified: Secondary | ICD-10-CM | POA: Diagnosis not present

## 2019-03-23 ENCOUNTER — Other Ambulatory Visit: Payer: Self-pay | Admitting: *Deleted

## 2019-03-23 DIAGNOSIS — G894 Chronic pain syndrome: Secondary | ICD-10-CM | POA: Diagnosis not present

## 2019-03-23 NOTE — Patient Outreach (Signed)
[facility]) Care Management  03/23/2019  Jamilyn Watchorn Lawry 09/05/1970 [id number]  CSW spoke with pt who is interested in getting linked with a female therapist/counselor in the [place] area. CSW is working to seek options for her that take her insurance and will outreach again next week with options.         Eduard Clos, MSW, [place] Worker  [place] [phone]

## 2019-03-27 ENCOUNTER — Other Ambulatory Visit: Payer: Self-pay | Admitting: *Deleted

## 2019-03-27 DIAGNOSIS — E782 Mixed hyperlipidemia: Secondary | ICD-10-CM | POA: Diagnosis not present

## 2019-03-27 DIAGNOSIS — I10 Essential (primary) hypertension: Secondary | ICD-10-CM | POA: Diagnosis not present

## 2019-03-27 DIAGNOSIS — G894 Chronic pain syndrome: Secondary | ICD-10-CM | POA: Diagnosis not present

## 2019-03-27 NOTE — Patient Outreach (Signed)
[place] [facility]) Care Management  03/27/2019  Armya P Rodd [birth year] [id number]   CSW made contact with pt today and facilitated a 3 way call with her insurance company to get names of in network providers.  CSW was then able to confirm agency in [place], [place] to see her for mental health counseling support. Pt plans to call and schedule a visit with them. CSW will touch base later in week for updates.   Eduard Clos, MSW, [place] Worker  [place] [phone]

## 2019-03-28 ENCOUNTER — Other Ambulatory Visit: Payer: Self-pay | Admitting: *Deleted

## 2019-03-28 ENCOUNTER — Encounter: Payer: Self-pay | Admitting: *Deleted

## 2019-03-28 NOTE — Patient Outreach (Signed)
Outreach call to pt for telephone assessment, spoke with pt, HIPAA verified, pt reports she is feeling better, breathing well, finished antibiotic, continues doing some exercises at home, intends to speak with MD this week about outpatient PT, reports has all medications and taking as prescribed.  CBG today 120 and has had an occasional elevated reading over the past week or two and pt feels may be related to stress.  Pt states her feet do well on occasion and she is trying to elevate them as much as possible.  PA from [facility] to see pt on 04/24/19 per pt.  Pt states THN CSW contacts her regularly and is assisting her to find female psychiatrist in [place] that accepts her insurance.  RN CM let pt know if she is willing to see psychiatrist in [place] there are options for female doctor there and gave number per pt request to [facility] outpatient behavioral health at [phone].  No further concerns or issues per pt.  RN CM sent in basket to [place] with update.  THN CM Care Plan Problem One     Most Recent Value  Care Plan Problem One  Knowledge deficit related to COPD/ recent pneumonia  Role Documenting the Problem One  Care Management Coordinator  Care Plan for Problem One  Active  THN Long Term Goal   Pt will verbalize improvement in self care related to COPD/ pneumonia within 60 days  THN Long Term Goal Start Date  02/16/19  Interventions for Problem One Long Term Goal  RN CM reviewed plan of care with pt,  pt did receive new masks and hoses for Bipap and cleaning equipment (masks, hoses)  THN CM Short Term Goal #1   Pt will verbalize COPD action plan within 30 days  THN CM Short Term Goal #1 Start Date  03/28/19 [goal re-established]  Interventions for Short Term Goal #1  RN CM reinforced COPD action plan, pt states she is is green zone today and feels better, has finished antibiotic    THN CM Care Plan Problem Two     Most Recent Value  Care Plan Problem Two  HIgh  risk for falls  Role Documenting the Problem Two  Care Management [place] for Problem Two  Active  THN CM Short Term Goal #1   Pt will have appointment for outpatient PT within 30 days  THN CM Short Term Goal #1 Start Date  03/28/19 [goal re-established]  Interventions for Short Term Goal #2   Pt has not yet spoken to MD about outpt PT but intends to this week, pt continues doing exercises at home  [facility] CM Short Term Goal #2   Pt will begin yoga routine at home within 30 days  [facility] CM Short Term Goal #2 Start Date  02/16/19  [facility] CM Short Term Goal #2 Met Date  03/15/19  THN CM Short Term Goal #3   Pt will verbalize safety precautions within 30 days  THN CM Short Term Goal #3 Start Date  02/16/19  [facility] CM Short Term Goal #3 Met Date  03/15/19      PLAN Outreach pt for telephone assessment next month Collaborate with [facility] CSW as needed  Jacqlyn Larsen [facility], [place] Coordinator [phone]

## 2019-03-30 ENCOUNTER — Other Ambulatory Visit: Payer: Self-pay | Admitting: *Deleted

## 2019-03-30 NOTE — Patient Outreach (Signed)
[place] [facility]) Care Management  03/30/2019  [name] [birth year] [id number]   CSW spoke with pt who reports she is still awaiting callback from therapist/counselor center for appointment.  CSW made another call and awaiting callback from possible provider. Pt agreed to consider a provider in [place] and plans to call [employer].  CSW will plan to check in with pt early next week for updates and support.  Eduard Clos, MSW, [place] Worker  [place] [phone]

## 2019-04-14 ENCOUNTER — Other Ambulatory Visit: Payer: Self-pay | Admitting: *Deleted

## 2019-04-14 NOTE — Patient Outreach (Signed)
[facility]) Care Management  04/14/2019  [name] Oct 11, 1970 [id number]   CSW spoke with pt who provided updates on her planned initial counseling appointment. Because of logistics and delays at providers practice they had to reschedule her visit for next week.   CSW will follow up after 9/24 appointment.   Eduard Clos, MSW, [place] Worker  [place] [phone]

## 2019-04-17 DIAGNOSIS — J449 Chronic obstructive pulmonary disease, unspecified: Secondary | ICD-10-CM | POA: Diagnosis not present

## 2019-04-17 DIAGNOSIS — J9610 Chronic respiratory failure, unspecified whether with hypoxia or hypercapnia: Secondary | ICD-10-CM | POA: Diagnosis not present

## 2019-04-19 DIAGNOSIS — G894 Chronic pain syndrome: Secondary | ICD-10-CM | POA: Diagnosis not present

## 2019-04-21 DIAGNOSIS — J449 Chronic obstructive pulmonary disease, unspecified: Secondary | ICD-10-CM | POA: Diagnosis not present

## 2019-04-26 ENCOUNTER — Other Ambulatory Visit: Payer: Self-pay | Admitting: *Deleted

## 2019-04-26 DIAGNOSIS — E782 Mixed hyperlipidemia: Secondary | ICD-10-CM | POA: Diagnosis not present

## 2019-04-26 DIAGNOSIS — I10 Essential (primary) hypertension: Secondary | ICD-10-CM | POA: Diagnosis not present

## 2019-04-26 DIAGNOSIS — K219 Gastro-esophageal reflux disease without esophagitis: Secondary | ICD-10-CM | POA: Diagnosis not present

## 2019-04-26 DIAGNOSIS — J449 Chronic obstructive pulmonary disease, unspecified: Secondary | ICD-10-CM | POA: Diagnosis not present

## 2019-04-26 NOTE — Patient Outreach (Signed)
[place] [facility]) Care Management  04/26/2019  [name] 1970-09-03 [id number]   CSW spoke with pt who reports she has pneumonia.  "I get it a few times a year".  She states she is on Levaquin and is feeling better.  She has some questions around what she should do in regards to activity with her pneumonia. CSW will ask [facility] RNCM to reach out to pt as well.  Pt has rescheduled her counseling appointment due to being sick. CSW will plan to call pt again in 7-10 days.  Eduard Clos, MSW, [place] Worker  [place] [phone]

## 2019-04-26 NOTE — Patient Outreach (Signed)
Request by [facility] CSW to contact pt regarding question pt has about activity level,  RN CM spoke with pt, HIPAA verified, pt states [employer] nurse visited her and listened to her lungs and reported this to MD, pt states she was told she had pneumonia but did not have chest xray, was put on levaquin, pt was instructed if she does not feel better in few days she needs to call MD and be seen.  Pt states she already feels better and will see how things go this week and will call MD if needed.  Pt states she is afebrile.  Pt has lower extremity edema and was told to elevate her legs and has been walking around some too and wanted to know if this is okay,  RN CM instructed pt to discuss any activity level/ restrictions with MD but instructed that elevating her legs throughout the day while resting will help with edema and also walking and staying mobile is good for overall health.  Pt states she cannot talk further as she is out taking her dog to the vet.  PLAN Outreach pt end of this week for telephone assessment  Jacqlyn Larsen [facility], [place] Coordinator [phone]

## 2019-04-28 ENCOUNTER — Encounter: Payer: Self-pay | Admitting: *Deleted

## 2019-04-28 ENCOUNTER — Other Ambulatory Visit: Payer: Self-pay | Admitting: *Deleted

## 2019-04-28 NOTE — Patient Outreach (Signed)
Outreach call to pt for telephone assessment, spoke with pt, HIPAA verified, pt reports she continues on antibiotic "and feels much better, not as winded"  Pt states CBG has been elevated somewhat due to respiratory illness with readings in 150's range, sometimes near 200.  Pt states she continues to smoke 1 pack per day cigarettes but has not smoked recently due to illness,  RN CM strongly encouraged pt consider smoking cessation and the detrimental effects on her health and chronic respiratory illness.  Pt states she has had "fever blisters and I have a few tiny blisters in my mouth"  Pt not sure if fever blister or side effect from antibiotic, pt denies cough or fever.  Pt also wonders if she should have chest xray although she feels better but continues to have respiratory illness.  RN CM ask pt to make appointment to follow up with primary MD for checkup and discuss any concerns with MD.  Pt verbalizes understanding and states she will do this today.  RN CM routed today's note to primary MD.  [facility] CM Care Plan Problem One     Most Recent Value  Care Plan Problem One  Knowledge deficit related to COPD/ recent pneumonia  Role Documenting the Problem One  Care Management Coordinator  Care Plan for Problem One  Active  THN Long Term Goal   Pt will verbalize improvement in self care related to COPD/ pneumonia within 60 days  THN Long Term Goal Start Date  04/28/19 [goal re-established]  Interventions for Problem One Long Term Goal  Pt continues to have episodes of pneumonia/ respiratory illness,  continues to smoke,  RNCM reinforced plan of care with pt, reviewed CBG readings, importance of making appointment with primary care  THN CM Short Term Goal #1   Pt will verbalize COPD action plan within 30 days  THN CM Short Term Goal #1 Start Date  04/28/19 [goal re-established]  Interventions for Short Term Goal #1  Goal re-established  pt can benefit from reinforcement, reminders,  RN CM reinforced COPD  action plan with emphasis on yellow zone, reviewed importance of smoking cessation, pt has tried not to smoke recently but not ready to quit  [facility] CM Short Term Goal #2   Pt will make appointment to see primary care provider within the next week  [facility] CM Short Term Goal #2 Start Date  04/28/19  Interventions for Short Term Goal #2  RN CM encouraged pt to make appointment to see primary care provider to discuss continuing respiratory illness    [facility] CM Care Plan Problem Two     Most Recent Value  Care Plan Problem Two  HIgh risk for falls  Role Documenting the Problem Two  Care Management Coordinator  Care Plan for Problem Two  Active  THN CM Short Term Goal #1   Pt will have appointment for outpatient PT within 30 days  THN CM Short Term Goal #1 Start Date  03/28/19 [goal re-established]  THN CM Short Term Goal #1 Met Date   04/28/19  Interventions for Short Term Goal #2   Pt has decided not to pursue this goal and continues exercising when she can at home.  THN CM Short Term Goal #2   Pt will begin yoga routine at home within 30 days  [facility] CM Short Term Goal #2 Start Date  02/16/19  [facility] CM Short Term Goal #2 Met Date  03/15/19  THN CM Short Term Goal #3   Pt will  verbalize safety precautions within 30 days  THN CM Short Term Goal #3 Start Date  02/16/19  [facility] CM Short Term Goal #3 Met Date  03/15/19      PLAN Outreach pt next week  Ask if pt made appointment to follow up with primary care  Jacqlyn Larsen [facility], [place] Coordinator [phone]

## 2019-05-05 ENCOUNTER — Ambulatory Visit: Payer: Self-pay | Admitting: *Deleted

## 2019-05-05 ENCOUNTER — Other Ambulatory Visit: Payer: Self-pay | Admitting: *Deleted

## 2019-05-05 NOTE — Patient Outreach (Signed)
Outreach call to pt for follow up on making primary care provider appointment, spoke with pt who reports she did make appointment and will see primary care MD next week on 05/09/19, pt has finished antibiotics but still has fatigue, is achy at times.  RN CM reinforced COPD action plan and ask pt to call RN CM for any questions or concerns.  PLAN Outreach pt next month for telephone assessment  [name] [facility], [place] Coordinator [phone]

## 2019-05-05 NOTE — Patient Outreach (Signed)
[name] [facility]) Care Management  05/05/2019  [name] Feb 09, 1971 [id number]   CSW attempted to reach pt by phone and left a HIPPA compliant voice message.  CSW will try again in 3-4 business days if no return call is received.   Eduard Clos, MSW, [place] Worker  [place] [phone]

## 2019-05-06 ENCOUNTER — Encounter (HOSPITAL_COMMUNITY): Payer: Self-pay | Admitting: Emergency Medicine

## 2019-05-06 ENCOUNTER — Emergency Department (HOSPITAL_COMMUNITY): Payer: Medicare Other

## 2019-05-06 ENCOUNTER — Other Ambulatory Visit: Payer: Self-pay

## 2019-05-06 ENCOUNTER — Inpatient Hospital Stay (HOSPITAL_COMMUNITY)
Admission: EM | Admit: 2019-05-06 | Discharge: 2019-05-09 | DRG: 871 | Disposition: A | Discharge status: Home / Self Care | Payer: Medicare Other | Attending: Internal Medicine | Admitting: Internal Medicine

## 2019-05-06 DIAGNOSIS — K5900 Constipation, unspecified: Secondary | ICD-10-CM | POA: Diagnosis present

## 2019-05-06 DIAGNOSIS — E1140 Type 2 diabetes mellitus with diabetic neuropathy, unspecified: Secondary | ICD-10-CM | POA: Diagnosis present

## 2019-05-06 DIAGNOSIS — T380X5A Adverse effect of glucocorticoids and synthetic analogues, initial encounter: Secondary | ICD-10-CM | POA: Diagnosis present

## 2019-05-06 DIAGNOSIS — Z833 Family history of diabetes mellitus: Secondary | ICD-10-CM

## 2019-05-06 DIAGNOSIS — Z20828 Contact with and (suspected) exposure to other viral communicable diseases: Secondary | ICD-10-CM | POA: Diagnosis present

## 2019-05-06 DIAGNOSIS — Z7951 Long term (current) use of inhaled steroids: Secondary | ICD-10-CM

## 2019-05-06 DIAGNOSIS — K921 Melena: Secondary | ICD-10-CM | POA: Diagnosis present

## 2019-05-06 DIAGNOSIS — A419 Sepsis, unspecified organism: Secondary | ICD-10-CM | POA: Diagnosis not present

## 2019-05-06 DIAGNOSIS — J440 Chronic obstructive pulmonary disease with acute lower respiratory infection: Secondary | ICD-10-CM | POA: Diagnosis present

## 2019-05-06 DIAGNOSIS — Z79890 Hormone replacement therapy (postmenopausal): Secondary | ICD-10-CM | POA: Diagnosis not present

## 2019-05-06 DIAGNOSIS — F419 Anxiety disorder, unspecified: Secondary | ICD-10-CM | POA: Diagnosis present

## 2019-05-06 DIAGNOSIS — Z87891 Personal history of nicotine dependence: Secondary | ICD-10-CM

## 2019-05-06 DIAGNOSIS — R918 Other nonspecific abnormal finding of lung field: Secondary | ICD-10-CM | POA: Diagnosis not present

## 2019-05-06 DIAGNOSIS — Z7984 Long term (current) use of oral hypoglycemic drugs: Secondary | ICD-10-CM | POA: Diagnosis not present

## 2019-05-06 DIAGNOSIS — D62 Acute posthemorrhagic anemia: Secondary | ICD-10-CM | POA: Diagnosis not present

## 2019-05-06 DIAGNOSIS — K2970 Gastritis, unspecified, without bleeding: Secondary | ICD-10-CM | POA: Diagnosis not present

## 2019-05-06 DIAGNOSIS — R000 Tachycardia, unspecified: Secondary | ICD-10-CM | POA: Diagnosis not present

## 2019-05-06 DIAGNOSIS — E1165 Type 2 diabetes mellitus with hyperglycemia: Secondary | ICD-10-CM | POA: Diagnosis present

## 2019-05-06 DIAGNOSIS — E119 Type 2 diabetes mellitus without complications: Secondary | ICD-10-CM

## 2019-05-06 DIAGNOSIS — Z9981 Dependence on supplemental oxygen: Secondary | ICD-10-CM | POA: Diagnosis not present

## 2019-05-06 DIAGNOSIS — J189 Pneumonia, unspecified organism: Secondary | ICD-10-CM | POA: Diagnosis not present

## 2019-05-06 DIAGNOSIS — K449 Diaphragmatic hernia without obstruction or gangrene: Secondary | ICD-10-CM | POA: Diagnosis present

## 2019-05-06 DIAGNOSIS — J441 Chronic obstructive pulmonary disease with (acute) exacerbation: Secondary | ICD-10-CM | POA: Diagnosis present

## 2019-05-06 DIAGNOSIS — J45901 Unspecified asthma with (acute) exacerbation: Secondary | ICD-10-CM | POA: Diagnosis not present

## 2019-05-06 DIAGNOSIS — I10 Essential (primary) hypertension: Secondary | ICD-10-CM | POA: Diagnosis not present

## 2019-05-06 DIAGNOSIS — E039 Hypothyroidism, unspecified: Secondary | ICD-10-CM | POA: Diagnosis not present

## 2019-05-06 DIAGNOSIS — K219 Gastro-esophageal reflux disease without esophagitis: Secondary | ICD-10-CM | POA: Diagnosis present

## 2019-05-06 DIAGNOSIS — E785 Hyperlipidemia, unspecified: Secondary | ICD-10-CM | POA: Diagnosis present

## 2019-05-06 DIAGNOSIS — Z79899 Other long term (current) drug therapy: Secondary | ICD-10-CM | POA: Diagnosis not present

## 2019-05-06 DIAGNOSIS — Z90710 Acquired absence of both cervix and uterus: Secondary | ICD-10-CM | POA: Diagnosis not present

## 2019-05-06 DIAGNOSIS — R0902 Hypoxemia: Secondary | ICD-10-CM | POA: Diagnosis present

## 2019-05-06 DIAGNOSIS — D509 Iron deficiency anemia, unspecified: Secondary | ICD-10-CM | POA: Diagnosis present

## 2019-05-06 DIAGNOSIS — D508 Other iron deficiency anemias: Secondary | ICD-10-CM | POA: Diagnosis not present

## 2019-05-06 DIAGNOSIS — E538 Deficiency of other specified B group vitamins: Secondary | ICD-10-CM | POA: Diagnosis present

## 2019-05-06 DIAGNOSIS — K861 Other chronic pancreatitis: Secondary | ICD-10-CM | POA: Diagnosis not present

## 2019-05-06 DIAGNOSIS — Z8719 Personal history of other diseases of the digestive system: Secondary | ICD-10-CM

## 2019-05-06 DIAGNOSIS — F319 Bipolar disorder, unspecified: Secondary | ICD-10-CM | POA: Diagnosis present

## 2019-05-06 DIAGNOSIS — G8929 Other chronic pain: Secondary | ICD-10-CM | POA: Diagnosis not present

## 2019-05-06 LAB — URINALYSIS, ROUTINE W REFLEX MICROSCOPIC
Bilirubin Urine: NEGATIVE
Glucose, UA: NEGATIVE mg/dL
Hgb urine dipstick: NEGATIVE
Ketones, ur: NEGATIVE mg/dL
Leukocytes,Ua: NEGATIVE
Nitrite: NEGATIVE
Protein, ur: NEGATIVE mg/dL
Specific Gravity, Urine: 1.004 — ABNORMAL LOW (ref 1.005–1.030)
pH: 5 (ref 5.0–8.0)

## 2019-05-06 LAB — CBC WITH DIFFERENTIAL/PLATELET
Abs Immature Granulocytes: 0.08 10*3/uL — ABNORMAL HIGH (ref 0.00–0.07)
Basophils Absolute: 0.1 10*3/uL (ref 0.0–0.1)
Basophils Relative: 0 %
Eosinophils Absolute: 1.4 10*3/uL — ABNORMAL HIGH (ref 0.0–0.5)
Eosinophils Relative: 7 %
HCT: 30 % — ABNORMAL LOW (ref 36.0–46.0)
Hemoglobin: 8.5 g/dL — ABNORMAL LOW (ref 12.0–15.0)
Immature Granulocytes: 0 %
Lymphocytes Relative: 7 %
Lymphs Abs: 1.4 10*3/uL (ref 0.7–4.0)
MCH: 23.2 pg — ABNORMAL LOW (ref 26.0–34.0)
MCHC: 28.3 g/dL — ABNORMAL LOW (ref 30.0–36.0)
MCV: 82 fL (ref 80.0–100.0)
Monocytes Absolute: 1.1 10*3/uL — ABNORMAL HIGH (ref 0.1–1.0)
Monocytes Relative: 6 %
Neutro Abs: 15.7 10*3/uL — ABNORMAL HIGH (ref 1.7–7.7)
Neutrophils Relative %: 80 %
Platelets: 420 10*3/uL — ABNORMAL HIGH (ref 150–400)
RBC: 3.66 MIL/uL — ABNORMAL LOW (ref 3.87–5.11)
RDW: 18.3 % — ABNORMAL HIGH (ref 11.5–15.5)
WBC: 19.8 10*3/uL — ABNORMAL HIGH (ref 4.0–10.5)
nRBC: 0.1 % (ref 0.0–0.2)

## 2019-05-06 LAB — BASIC METABOLIC PANEL
Anion gap: 14 (ref 5–15)
BUN: 12 mg/dL (ref 6–20)
CO2: 24 mmol/L (ref 22–32)
Calcium: 9.5 mg/dL (ref 8.9–10.3)
Chloride: 101 mmol/L (ref 98–111)
Creatinine, Ser: 0.74 mg/dL (ref 0.44–1.00)
GFR calc Af Amer: >60 mL/min (ref >60)
GFR calc non Af Amer: >60 mL/min (ref >60)
Glucose, Bld: 95 mg/dL (ref 70–99)
Potassium: 4.1 mmol/L (ref 3.5–5.1)
Sodium: 139 mmol/L (ref 135–145)

## 2019-05-06 LAB — SARS CORONAVIRUS 2 BY RT PCR (HOSPITAL ORDER, PERFORMED IN ~~LOC~~ HOSPITAL LAB): SARS Coronavirus 2: NEGATIVE

## 2019-05-06 LAB — LACTIC ACID, PLASMA
Lactic Acid, Venous: 2.8 mmol/L (ref 0.5–1.9)
Lactic Acid, Venous: 2.6 mmol/L (ref 0.5–1.9)

## 2019-05-06 LAB — BRAIN NATRIURETIC PEPTIDE: B Natriuretic Peptide: 122 pg/mL — ABNORMAL HIGH (ref 0.0–100.0)

## 2019-05-06 LAB — TROPONIN I (HIGH SENSITIVITY)
Troponin I (High Sensitivity): <2 ng/L (ref <18)
Troponin I (High Sensitivity): <2 ng/L (ref <18)

## 2019-05-06 LAB — GLUCOSE, CAPILLARY: Glucose-Capillary: 247 mg/dL — ABNORMAL HIGH (ref 70–99)

## 2019-05-06 MED ORDER — SODIUM CHLORIDE 0.9 % IV BOLUS
1000.0000 mL | Freq: Once | INTRAVENOUS | Status: AC
  Administered 2019-05-06: 1000 mL via INTRAVENOUS

## 2019-05-06 MED ORDER — SODIUM CHLORIDE 0.9 % IV SOLN
INTRAVENOUS | Status: AC
  Administered 2019-05-06: 20:00:00 via INTRAVENOUS

## 2019-05-06 MED ORDER — IPRATROPIUM BROMIDE 0.02 % IN SOLN
1.0000 mg | Freq: Once | RESPIRATORY_TRACT | Status: AC
  Administered 2019-05-06: 1 mg via RESPIRATORY_TRACT
  Filled 2019-05-06: qty 5

## 2019-05-06 MED ORDER — BUSPIRONE HCL 5 MG PO TABS
10.0000 mg | ORAL_TABLET | Freq: Three times a day (TID) | ORAL | Status: DC
  Administered 2019-05-07 – 2019-05-09 (×8): 10 mg via ORAL
  Filled 2019-05-06 (×8): qty 2

## 2019-05-06 MED ORDER — DOCUSATE SODIUM 100 MG PO CAPS
100.0000 mg | ORAL_CAPSULE | Freq: Every day | ORAL | Status: DC
  Administered 2019-05-07 – 2019-05-08 (×3): 100 mg via ORAL
  Filled 2019-05-06 (×3): qty 1

## 2019-05-06 MED ORDER — ALBUTEROL (5 MG/ML) CONTINUOUS INHALATION SOLN
10.0000 mg/h | INHALATION_SOLUTION | Freq: Once | RESPIRATORY_TRACT | Status: AC
  Administered 2019-05-06: 10 mg/h via RESPIRATORY_TRACT
  Filled 2019-05-06: qty 20

## 2019-05-06 MED ORDER — ALBUTEROL SULFATE HFA 108 (90 BASE) MCG/ACT IN AERS
8.0000 | INHALATION_SPRAY | RESPIRATORY_TRACT | Status: AC
  Administered 2019-05-06: 8 via RESPIRATORY_TRACT
  Filled 2019-05-06: qty 6.7

## 2019-05-06 MED ORDER — LEVOTHYROXINE SODIUM 25 MCG PO TABS
25.0000 ug | ORAL_TABLET | Freq: Every day | ORAL | Status: DC
  Administered 2019-05-07 – 2019-05-09 (×3): 25 ug via ORAL
  Filled 2019-05-06 (×3): qty 1

## 2019-05-06 MED ORDER — LEVOFLOXACIN IN D5W 750 MG/150ML IV SOLN
750.0000 mg | INTRAVENOUS | Status: DC
  Administered 2019-05-06 – 2019-05-07 (×2): 750 mg via INTRAVENOUS
  Filled 2019-05-06 (×3): qty 150

## 2019-05-06 MED ORDER — METHYLPREDNISOLONE SODIUM SUCC 125 MG IJ SOLR
80.0000 mg | Freq: Three times a day (TID) | INTRAMUSCULAR | Status: DC
  Administered 2019-05-07: 80 mg via INTRAVENOUS
  Filled 2019-05-06: qty 2

## 2019-05-06 MED ORDER — MOMETASONE FURO-FORMOTEROL FUM 100-5 MCG/ACT IN AERO
2.0000 | INHALATION_SPRAY | Freq: Two times a day (BID) | RESPIRATORY_TRACT | Status: DC
  Administered 2019-05-07 – 2019-05-08 (×4): 2 via RESPIRATORY_TRACT
  Filled 2019-05-06: qty 8.8

## 2019-05-06 MED ORDER — ZENPEP 20000-68000 UNITS PO CPEP: 2.0000 | ORAL_CAPSULE | Freq: Every day | ORAL | Status: DC

## 2019-05-06 MED ORDER — VALACYCLOVIR HCL 500 MG PO TABS
500.0000 mg | ORAL_TABLET | Freq: Every day | ORAL | Status: DC
  Administered 2019-05-07 – 2019-05-09 (×3): 500 mg via ORAL
  Filled 2019-05-06 (×3): qty 1

## 2019-05-06 MED ORDER — LORAZEPAM 0.5 MG PO TABS
0.5000 mg | ORAL_TABLET | Freq: Three times a day (TID) | ORAL | Status: DC | PRN
  Administered 2019-05-07 (×2): 0.5 mg via ORAL
  Filled 2019-05-06 (×2): qty 1

## 2019-05-06 MED ORDER — FERROUS SULFATE 325 (65 FE) MG PO TABS
325.0000 mg | ORAL_TABLET | Freq: Every day | ORAL | Status: DC
  Administered 2019-05-07 – 2019-05-08 (×2): 325 mg via ORAL
  Filled 2019-05-06 (×2): qty 1

## 2019-05-06 MED ORDER — METHYLPREDNISOLONE SODIUM SUCC 125 MG IJ SOLR
125.0000 mg | Freq: Once | INTRAMUSCULAR | Status: AC
  Administered 2019-05-06: 125 mg via INTRAVENOUS
  Filled 2019-05-06: qty 2

## 2019-05-06 MED ORDER — METFORMIN HCL 500 MG PO TABS
1000.0000 mg | ORAL_TABLET | Freq: Two times a day (BID) | ORAL | Status: DC
  Administered 2019-05-07 – 2019-05-08 (×3): 1000 mg via ORAL
  Filled 2019-05-06 (×4): qty 2

## 2019-05-06 MED ORDER — METHOCARBAMOL 500 MG PO TABS
500.0000 mg | ORAL_TABLET | Freq: Three times a day (TID) | ORAL | Status: DC
  Administered 2019-05-07 – 2019-05-09 (×8): 500 mg via ORAL
  Filled 2019-05-06 (×8): qty 1

## 2019-05-06 MED ORDER — PROMETHAZINE HCL 12.5 MG PO TABS
25.0000 mg | ORAL_TABLET | Freq: Four times a day (QID) | ORAL | Status: DC | PRN
  Administered 2019-05-07: 25 mg via ORAL
  Filled 2019-05-06: qty 2

## 2019-05-06 MED ORDER — PANTOPRAZOLE SODIUM 40 MG PO TBEC
40.0000 mg | DELAYED_RELEASE_TABLET | Freq: Two times a day (BID) | ORAL | Status: DC
  Administered 2019-05-07 (×2): 40 mg via ORAL
  Filled 2019-05-06 (×2): qty 1

## 2019-05-06 MED ORDER — POLYETHYLENE GLYCOL 3350 17 GM/SCOOP PO POWD
17.0000 g | Freq: Two times a day (BID) | ORAL | Status: DC
  Filled 2019-05-06: qty 255

## 2019-05-06 MED ORDER — INSULIN ASPART 100 UNIT/ML ~~LOC~~ SOLN
0.0000 [IU] | Freq: Three times a day (TID) | SUBCUTANEOUS | Status: DC
  Administered 2019-05-07: 3 [IU] via SUBCUTANEOUS
  Administered 2019-05-07 – 2019-05-08 (×2): 2 [IU] via SUBCUTANEOUS
  Administered 2019-05-08: 1 [IU] via SUBCUTANEOUS
  Administered 2019-05-08: 2 [IU] via SUBCUTANEOUS

## 2019-05-06 MED ORDER — ATORVASTATIN CALCIUM 20 MG PO TABS
20.0000 mg | ORAL_TABLET | Freq: Every day | ORAL | Status: DC
  Administered 2019-05-07 – 2019-05-09 (×3): 20 mg via ORAL
  Filled 2019-05-06 (×3): qty 1

## 2019-05-06 MED ORDER — FUROSEMIDE 20 MG PO TABS
20.0000 mg | ORAL_TABLET | Freq: Every day | ORAL | Status: DC
  Administered 2019-05-07 – 2019-05-09 (×3): 20 mg via ORAL
  Filled 2019-05-06 (×3): qty 1

## 2019-05-06 MED ORDER — AMITRIPTYLINE HCL 25 MG PO TABS
100.0000 mg | ORAL_TABLET | Freq: Every day | ORAL | Status: DC
  Administered 2019-05-07 – 2019-05-08 (×3): 100 mg via ORAL
  Filled 2019-05-06 (×3): qty 4

## 2019-05-06 MED ORDER — LISINOPRIL 5 MG PO TABS
2.5000 mg | ORAL_TABLET | Freq: Every day | ORAL | Status: DC
  Administered 2019-05-07 – 2019-05-09 (×3): 2.5 mg via ORAL
  Filled 2019-05-06 (×3): qty 1

## 2019-05-06 MED ORDER — INSULIN ASPART 100 UNIT/ML ~~LOC~~ SOLN
0.0000 [IU] | Freq: Every day | SUBCUTANEOUS | Status: DC
  Administered 2019-05-06 – 2019-05-07 (×2): 2 [IU] via SUBCUTANEOUS

## 2019-05-06 MED ORDER — OXYCODONE HCL 5 MG PO TABS
15.0000 mg | ORAL_TABLET | Freq: Four times a day (QID) | ORAL | Status: DC | PRN
  Administered 2019-05-07 – 2019-05-09 (×8): 15 mg via ORAL
  Filled 2019-05-06 (×8): qty 3

## 2019-05-06 MED ORDER — CITALOPRAM HYDROBROMIDE 20 MG PO TABS
20.0000 mg | ORAL_TABLET | Freq: Every day | ORAL | Status: DC
  Administered 2019-05-07 – 2019-05-09 (×3): 20 mg via ORAL
  Filled 2019-05-06 (×3): qty 1

## 2019-05-06 NOTE — ED Notes (Signed)
NEB still in process. Pt given Sprite

## 2019-05-06 NOTE — ED Triage Notes (Signed)
Pt states she has asthma and has been battling pneumonia since a hospital admission in July. Pt seen PCP at [facility] Thursday has been on multiple rounds of antibiotics. Follow-up with PCP scheduled next week. Pt states symptoms seem worse today.

## 2019-05-06 NOTE — Progress Notes (Signed)
Pharmacy Antibiotic Note  [name] is a 48 y.o. female admitted on 05/06/2019 with pneumonia.  Pharmacy has been consulted for levaquin dosing.  Plan: levaquin 750mg  iv q24h  Height: 5\' 2"  (157.5 cm) Weight: 122 lb (55.3 kg) IBW/kg (Calculated) : 50.1  Temp (24hrs), Avg:100.1 F (37.8 C), Min:100.1 F (37.8 C), Max:100.1 F (37.8 C)  Recent Labs  Lab 05/06/19 1746  WBC 19.8*  CREATININE 0.74  LATICACIDVEN 2.8*    Estimated Creatinine Clearance: 66.5 mL/min (by C-G formula based on SCr of 0.74 mg/dL).    Allergies  Allergen Reactions  . Penicillins Shortness Of Breath     Has patient had a PCN reaction causing immediate rash, facial/tongue/throat swelling, SOB or lightheadedness with hypotension: Yes Has patient had a PCN reaction causing severe rash involving mucus membranes or skin necrosis: Yes Has patient had a PCN reaction that required hospitalization No Has patient had a PCN reaction occurring within the last 10 years: No  If all of the above answers are "NO", then may proceed with Cephalosporin use.   . Sulfa Antibiotics Shortness Of Breath  . Aspirin Nausea And Vomiting  . Lovenox  [Enoxaparin Sodium] Other (See Comments)    Has been known to cause her blood clots in her legs  . Prednisone Other (See Comments)    Pancreatitis, but has to take for asthma sometimes    Antimicrobials this admission: 10/10 levaquin >>   Microbiology results: 10/10 BCx: sent 10/10 UCx: sent    Thank you for allowing pharmacy to be a part of this patient's care.  [name] [name] 05/06/2019 7:31 PM

## 2019-05-06 NOTE — ED Notes (Signed)
Neb still in process

## 2019-05-06 NOTE — H&P (Addendum)
TRH H&P    Patient Demographics:    [name], is a 48 y.o. female  MRN: [id number]  DOB - 1971-07-06  Admit Date - 05/06/2019  Referring MD/NP/PA: Margette Fast  Outpatient Primary MD for the patient is Redmond School, MD  Patient coming from:  home  Chief complaint- dyspnea   HPI:    [name]  is a 48 y.o. female, w Hypertension, Hyperlipidemia, Dm2, w neuropathy, Gerd, Asthma/ Copd presents w dyspnea for the past 4 weeks worse today, along with dry cough, and subjective fever.  Pt denies cp, palp, n/v, abd pain, diarrhea, brbpr, dysuria, hematuria.   In ED,  T 100.1 P 125, R 18, Bp 123/80  Pox 88% on RA Wt 55.3, kg  CXR IMPRESSION: 1. Diffuse bilateral interstitial opacities could represent reactive airways disease, atypical infection, or edema. 2. Small focal opacities in the right base and left mid lung may reflect superimposed atelectasis versus infection.  Na 139, K 4.1, Bun 12, Creatinine 0.74 Wbc 19.8, Hgb 8.5, Plt 420 Urinalysis negative Trop <2  BNP 122 Lactic acid 2.8-> 2.6  covid-19 negative  Blood culture x2 pending  Pt given solumedrol 125mg  iv x1, levaquin iv x1 in ED, along with albuterol neb x2, atrovent neb x1.   Pt will be admitted for CAP, possibly early sepsis (fever, tachycardia, elevated lactic acid), and Copd/ asthma exacerbation.    Review of systems:    In addition to the HPI above,   No Headache, No changes with Vision or hearing, No problems swallowing food or Liquids, No Chest pain, No Abdominal pain, No Nausea or Vomiting, bowel movements are regular, No Blood in stool or Urine, No dysuria, No new skin rashes or bruises, No new joints pains-aches,  No new weakness, tingling, numbness in any extremity, No recent weight gain or loss, No polyuria, polydypsia or polyphagia, No significant Mental Stressors.  All other systems  reviewed and are negative.    Past History of the following :    Past Medical History:  Diagnosis Date  . Anemia   . Anxiety   . Asthma   . Bipolar 1 disorder ([place])   . Chronic abdominal pain   . COPD (chronic obstructive pulmonary disease) ([place])   . Depression   . Diabetes mellitus   . Diabetic neuropathy ([place]) 10/29/2017  . Esophagitis   . Hiatal hernia   . Hyperlipidemia 02/03/2012  . Migraine   . Neck pain 06/08/2012  . Pancreatitis chronic Idiopathic   Treated by Dr. Newman Pies at [facility] in the past with celiac blocks.      Past Surgical History:  Procedure Laterality Date  . ABDOMINAL HYSTERECTOMY    . CELIAC PLEXUS BLOCK    . CHOLECYSTECTOMY    . COLONOSCOPY N/A 12/05/2012   [id number] rectum, colon and terminal ileum  . ESOPHAGOGASTRODUODENOSCOPY  02/20/2008   [id number] distal esophageal mucosa, suspicious for neoplasm/Hiatal hernia otherwise normal   . ESOPHAGOGASTRODUODENOSCOPY  04/03/2008   [id number] hiatal hernia, otherwise normal/Short, tight, benign-appearing peptic stricture  . ESOPHAGOGASTRODUODENOSCOPY  November 16, 2012   Dr. Britta Mccreedy: hiatal hernia, esophagitis,   . GIVENS CAPSULE STUDY N/A 12/05/2012   Few superficial erosions but nothing found to explain iron deficiency anemia.   . Ileocolonoscopy  06/05/2008     [id number] anal canal, otherwise normal rectum, colon  . TONSILLECTOMY        Social History:      Social History   Tobacco Use  . Smoking status: Former Research scientist (life sciences)  . Smokeless tobacco: Never Used  Substance Use Topics  . Alcohol use: No       Family History :     Family History  Problem Relation Age of Onset  . Asthma Other   . Asthma Paternal Grandfather   . Heart attack Paternal Grandfather   . Hypertension Mother   . Cancer Maternal Grandmother   . Diabetes Paternal Grandmother   . Coronary artery disease Neg Hx   . Colon cancer Neg Hx        Home Medications:   Prior to Admission medications    Medication Sig Start Date End Date Taking? Authorizing Provider  albuterol (PROVENTIL HFA;VENTOLIN HFA) 108 (90 BASE) MCG/ACT inhaler Inhale 2 puffs into the lungs daily as needed for wheezing. For shortness of breath    [provider]  amitriptyline (ELAVIL) 100 MG tablet Take 100 mg by mouth at bedtime.    [provider]  amitriptyline (ELAVIL) 75 MG tablet Take 75 mg by mouth at bedtime.    [provider]  amLODipine (NORVASC) 10 MG tablet Take 10 mg by mouth at bedtime.     [provider]  atorvastatin (LIPITOR) 20 MG tablet Take 20 mg by mouth daily.    [provider]  budesonide-formoterol (SYMBICORT) 80-4.5 MCG/ACT inhaler Inhale 2 puffs into the lungs 2 (two) times daily. 03/30/14   Samuella Cota, MD  busPIRone (BUSPAR) 10 MG tablet Take 10 mg by mouth 3 (three) times daily.    [provider]  citalopram (CELEXA) 20 MG tablet Take 20 mg by mouth daily.    [provider]  docusate sodium (COLACE) 100 MG capsule Take 100 mg by mouth at bedtime.     [provider]  ferrous sulfate 325 (65 FE) MG tablet Take 325 mg by mouth daily.    [provider]  furosemide (LASIX) 20 MG tablet Take 20 mg by mouth.    [provider]  ipratropium-albuterol (DUONEB) 0.5-2.5 (3) MG/3ML SOLN Inhale 3 mLs into the lungs every 6 (six) hours as needed. Take 3 mLs by nebulization every 6 (six) hours as needed for Wheezing. 07/01/13   Ghimire, Henreitta Leber, MD  levothyroxine (SYNTHROID, LEVOTHROID) 25 MCG tablet Take 25 mcg by mouth daily before breakfast.    [provider]  lipase/protease/amylase (CREON) 12000 units CPEP capsule Take 2,000 Units by mouth 3 (three) times daily before meals.    [provider]  lisinopril (PRINIVIL,ZESTRIL) 2.5 MG tablet  01/18/18   [provider]  lisinopril (ZESTRIL) 2.5 MG tablet Take 2.5 mg by mouth daily.    [provider]  LORazepam (ATIVAN)  0.5 MG tablet Take 0.5 mg by mouth every 8 (eight) hours as needed for anxiety.     [provider]  metFORMIN (GLUCOPHAGE) 500 MG tablet Take 1,000 mg by mouth 2 (two) times daily with a meal.  07/01/13   Ghimire, Henreitta Leber, MD  metFORMIN (GLUCOPHAGE) 500 MG tablet Take by mouth 4 (four) times daily.  [provider]  methocarbamol (ROBAXIN) 500 MG tablet Take 1 tablet (500 mg total) by mouth 2 (two) times daily. 10/29/17   Kathrynn Ducking, MD  methocarbamol (ROBAXIN) 500 MG tablet Take 500 mg by mouth 3 (three) times daily.    [provider]  oxycodone (ROXICODONE) 30 MG immediate release tablet Take 30 mg by mouth every 3 (three) hours as needed for pain.    [provider]  oxycodone (ROXICODONE) 30 MG immediate release tablet Take 30 mg by mouth every 8 (eight) hours as needed for pain.    [provider]  OXYGEN Inhale 3 L into the lungs daily as needed (for shortness of breath).    [provider]  Pancrelipase, Lip-Prot-Amyl, (ZENPEP) 20000 UNITS CPEP Take 2-3 capsules by mouth 5 (five) times daily. 3 capsules three times daily with each meal and 2 capsules with each snack.    [provider]  pantoprazole (PROTONIX) 40 MG tablet Take 40 mg by mouth 2 (two) times daily.    [provider]  polyethylene glycol powder (GLYCOLAX/MIRALAX) powder Take 17 g by mouth 2 (two) times daily. Until daily soft stools  OTC 03/05/15   Nona Dell, PA-C  promethazine (PHENERGAN) 25 MG tablet Take 25 mg by mouth every 6 (six) hours as needed for nausea or vomiting.    [provider]  valACYclovir (VALTREX) 500 MG tablet Take 500 mg by mouth daily.    [provider]     Allergies:     Allergies  Allergen Reactions  . Penicillins Shortness Of Breath     Has patient had a PCN reaction causing immediate rash, facial/tongue/throat swelling, SOB or lightheadedness with hypotension: Yes Has patient had a  PCN reaction causing severe rash involving mucus membranes or skin necrosis: Yes Has patient had a PCN reaction that required hospitalization No Has patient had a PCN reaction occurring within the last 10 years: No  If all of the above answers are "NO", then may proceed with Cephalosporin use.   . Sulfa Antibiotics Shortness Of Breath  . Aspirin Nausea And Vomiting  . Lovenox  [Enoxaparin Sodium] Other (See Comments)    Has been known to cause her blood clots in her legs  . Prednisone Other (See Comments)    Pancreatitis, but has to take for asthma sometimes     Physical Exam:   Vitals  Blood pressure 115/65, pulse (!) 114, temperature 100.1 F (37.8 C), temperature source Oral, resp. rate 13, height 5\' 2"  (1.575 m), weight 55.3 kg, SpO2 98 %.  1.  General: axox3  2. Psychiatric: euthymic  3. Neurologic: cn2-12 intact, reflexes 2+ symmetric, diffuse with no clonus, motor 5/5 in all 4 ext  4. HEENMT:  Anicteric, pupils 1.31mm symmetric, direct consensual, intact Neck: no jvd  5. Respiratory : + bibasilar crackles, no exp wheezing  6. Cardiovascular : rrr s1, s2,   7. Gastrointestinal:  Abd: soft, nt, nd, +bs  8. Skin:  Ext: no c/c/e,  No rash  9.Musculoskeletal:  Good ROM,      Data Review:    CBC Recent Labs  Lab 05/06/19 1746  WBC 19.8*  HGB 8.5*  HCT 30.0*  PLT 420*  MCV 82.0  MCH 23.2*  MCHC 28.3*  RDW 18.3*  LYMPHSABS 1.4  MONOABS 1.1*  EOSABS 1.4*  BASOSABS 0.1   ------------------------------------------------------------------------------------------------------------------  Results for orders placed or performed during the hospital encounter of 05/06/19 (from the past 48 hour(s))  SARS  Coronavirus 2 by RT PCR (hospital order, performed in [facility] hospital lab) Nasopharyngeal Nasopharyngeal Swab     Status: None   Collection Time: 05/06/19  5:46 PM   Specimen: Nasopharyngeal Swab  Result Value Ref Range   SARS Coronavirus 2 NEGATIVE  NEGATIVE    Comment: (NOTE) If result is NEGATIVE SARS-CoV-2 target nucleic acids are NOT DETECTED. The SARS-CoV-2 RNA is generally detectable in upper and lower  respiratory specimens during the acute phase of infection. The lowest  concentration of SARS-CoV-2 viral copies this assay can detect is 250  copies / mL. A negative result does not preclude SARS-CoV-2 infection  and should not be used as the sole basis for treatment or other  patient management decisions.  A negative result may occur with  improper specimen collection / handling, submission of specimen other  than nasopharyngeal swab, presence of viral mutation(s) within the  areas targeted by this assay, and inadequate number of viral copies  (<250 copies / mL). A negative result must be combined with clinical  observations, patient history, and epidemiological information. If result is POSITIVE SARS-CoV-2 target nucleic acids are DETECTED. The SARS-CoV-2 RNA is generally detectable in upper and lower  respiratory specimens dur ing the acute phase of infection.  Positive  results are indicative of active infection with SARS-CoV-2.  Clinical  correlation with patient history and other diagnostic information is  necessary to determine patient infection status.  Positive results do  not rule out bacterial infection or co-infection with other viruses. If result is PRESUMPTIVE POSTIVE SARS-CoV-2 nucleic acids MAY BE PRESENT.   A presumptive positive result was obtained on the submitted specimen  and confirmed on repeat testing.  While 2019 novel coronavirus  (SARS-CoV-2) nucleic acids may be present in the submitted sample  additional confirmatory testing may be necessary for epidemiological  and / or clinical management purposes  to differentiate between  SARS-CoV-2 and other Sarbecovirus currently known to infect humans.  If clinically indicated additional testing with an alternate test  methodology [phone]) is advised. The  SARS-CoV-2 RNA is generally  detectable in upper and lower respiratory sp ecimens during the acute  phase of infection. The expected result is Negative. Fact Sheet for Patients:  StrictlyIdeas.no Fact Sheet for Healthcare Providers: BankingDealers.co.za This test is not yet approved or cleared by the [place] FDA and has been authorized for detection and/or diagnosis of SARS-CoV-2 by FDA under an Emergency Use Authorization (EUA).  This EUA will remain in effect (meaning this test can be used) for the duration of the COVID-19 declaration under Section 564(b)(1) of the Act, 21 U.S.C. section 360bbb-3(b)(1), unless the authorization is terminated or revoked sooner. Performed at [facility], [address], [place], [place] [id number]   Basic metabolic panel     Status: None   Collection Time: 05/06/19  5:46 PM  Result Value Ref Range   Sodium 139 135 - 145 mmol/L   Potassium 4.1 3.5 - 5.1 mmol/L   Chloride 101 98 - 111 mmol/L   CO2 24 22 - 32 mmol/L   Glucose, Bld 95 70 - 99 mg/dL   BUN 12 6 - 20 mg/dL   Creatinine, Ser 0.74 0.44 - 1.00 mg/dL   Calcium 9.5 8.9 - 10.3 mg/dL   GFR calc non Af Amer >60 >60 mL/min   GFR calc Af Amer >60 >60 mL/min   Anion gap 14 5 - 15    Comment: Performed at [facility], [address], [place],  [place] [address]  Brain natriuretic peptide     Status: Abnormal   Collection Time: 05/06/19  5:46 PM  Result Value Ref Range   B Natriuretic Peptide 122.0 (H) 0.0 - 100.0 pg/mL    Comment: Performed at [facility], [address] [place], [place] [address]  Troponin I (High Sensitivity)     Status: None   Collection Time: 05/06/19  5:46 PM  Result Value Ref Range   Troponin I (High Sensitivity) <2.0 <18 ng/L    Comment: Performed at [facility], [address], [place], [place] [address]  Lactic acid, plasma     Status: Abnormal   Collection Time: 05/06/19  5:46 PM  Result Value Ref Range   Lactic  Acid, Venous 2.8 (HH) 0.5 - 1.9 mmol/L    Comment: CRITICAL RESULT CALLED TO, READ BACK BY AND VERIFIED WITH: WESTON,L@[email or web address]  BY MATTHEWS, B 10.10.2020 Performed at [facility], [address] [place], [place] [address]   CBC with Differential     Status: Abnormal   Collection Time: 05/06/19  5:46 PM  Result Value Ref Range   WBC 19.8 (H) 4.0 - 10.5 K/uL   RBC 3.66 (L) 3.87 - 5.11 MIL/uL   Hemoglobin 8.5 (L) 12.0 - 15.0 g/dL   HCT 30.0 (L) 36.0 - 46.0 %   MCV 82.0 80.0 - 100.0 fL   MCH 23.2 (L) 26.0 - 34.0 pg   MCHC 28.3 (L) 30.0 - 36.0 g/dL   RDW 18.3 (H) 11.5 - 15.5 %   Platelets 420 (H) 150 - 400 K/uL   nRBC 0.1 0.0 - 0.2 %   Neutrophils Relative % 80 %   Neutro Abs 15.7 (H) 1.7 - 7.7 K/uL   Lymphocytes Relative 7 %   Lymphs Abs 1.4 0.7 - 4.0 K/uL   Monocytes Relative 6 %   Monocytes Absolute 1.1 (H) 0.1 - 1.0 K/uL   Eosinophils Relative 7 %   Eosinophils Absolute 1.4 (H) 0.0 - 0.5 K/uL   Basophils Relative 0 %   Basophils Absolute 0.1 0.0 - 0.1 K/uL   Immature Granulocytes 0 %   Abs Immature Granulocytes 0.08 (H) 0.00 - 0.07 K/uL    Comment: Performed at [facility], [address], [place], [place] [address]  Urinalysis, Routine w reflex microscopic     Status: Abnormal   Collection Time: 05/06/19  5:46 PM  Result Value Ref Range   Color, Urine COLORLESS (A) YELLOW   APPearance CLEAR CLEAR   Specific Gravity, Urine 1.004 (L) 1.005 - 1.030   pH 5.0 5.0 - 8.0   Glucose, UA NEGATIVE NEGATIVE mg/dL   Hgb urine dipstick NEGATIVE NEGATIVE   Bilirubin Urine NEGATIVE NEGATIVE   Ketones, ur NEGATIVE NEGATIVE mg/dL   Protein, ur NEGATIVE NEGATIVE mg/dL   Nitrite NEGATIVE NEGATIVE   Leukocytes,Ua NEGATIVE NEGATIVE    Comment: Performed at [facility], [address] [place], [place] [address]  Lactic acid, plasma     Status: Abnormal   Collection Time: 05/06/19  7:30 PM  Result Value Ref Range   Lactic Acid, Venous 2.6 (HH) 0.5 - 1.9 mmol/L    Comment: CRITICAL VALUE  NOTED.  VALUE IS CONSISTENT WITH PREVIOUSLY REPORTED AND CALLED VALUE. Performed at [facility], [address] [place], [place] [address]   Troponin I (High Sensitivity)     Status: None   Collection Time: 05/06/19  7:30 PM  Result Value Ref Range   Troponin I (High Sensitivity) <2.0 <18 ng/L  Comment: (NOTE) Elevated high sensitivity troponin I (hsTnI) values and significant  changes across serial measurements may suggest ACS but many other  chronic and acute conditions are known to elevate hsTnI results.  Refer to the Links section for chest pain algorithms and additional  guidance. Performed at [facility], [address] [place], [place] [address]     Chemistries  Recent Labs  Lab 05/06/19 1746  NA 139  K 4.1  CL 101  CO2 24  GLUCOSE 95  BUN 12  CREATININE 0.74  CALCIUM 9.5   ------------------------------------------------------------------------------------------------------------------  ------------------------------------------------------------------------------------------------------------------ GFR: Estimated Creatinine Clearance: 66.5 mL/min (by C-G formula based on SCr of 0.74 mg/dL). Liver Function Tests: No results for input(s): AST, ALT, ALKPHOS, BILITOT, PROT, ALBUMIN in the last 168 hours. No results for input(s): LIPASE, AMYLASE in the last 168 hours. No results for input(s): AMMONIA in the last 168 hours. Coagulation Profile: No results for input(s): INR, PROTIME in the last 168 hours. Cardiac Enzymes: No results for input(s): CKTOTAL, CKMB, CKMBINDEX, TROPONINI in the last 168 hours. BNP (last 3 results) No results for input(s): PROBNP in the last 8760 hours. HbA1C: No results for input(s): HGBA1C in the last 72 hours. CBG: No results for input(s): GLUCAP in the last 168 hours. Lipid Profile: No results for input(s): CHOL, HDL, LDLCALC, TRIG, CHOLHDL, LDLDIRECT in the last 72 hours. Thyroid Function Tests: No results for input(s): TSH,  T4TOTAL, FREET4, T3FREE, THYROIDAB in the last 72 hours. Anemia Panel: No results for input(s): VITAMINB12, FOLATE, FERRITIN, TIBC, IRON, RETICCTPCT in the last 72 hours.  --------------------------------------------------------------------------------------------------------------- Urine analysis:    Component Value Date/Time   COLORURINE COLORLESS (A) 05/06/2019 1746   APPEARANCEUR CLEAR 05/06/2019 1746   LABSPEC 1.004 (L) 05/06/2019 1746   PHURINE 5.0 05/06/2019 1746   GLUCOSEU NEGATIVE 05/06/2019 1746   HGBUR NEGATIVE 05/06/2019 1746   BILIRUBINUR NEGATIVE 05/06/2019 1746   KETONESUR NEGATIVE 05/06/2019 1746   PROTEINUR NEGATIVE 05/06/2019 1746   UROBILINOGEN 0.2 12/06/2014 1819   NITRITE NEGATIVE 05/06/2019 1746   LEUKOCYTESUR NEGATIVE 05/06/2019 1746      Imaging Results:    Dg Chest Port 1 View  Result Date: 05/06/2019 CLINICAL DATA:  Pt states she has asthma and has been battling pneumonia since a hospital admission in July. Pt seen PCP at [facility] Thursday has been on multiple rounds of antibiotics. Follow-up with PCP scheduled next week. EXAM: PORTABLE CHEST 1 VIEW COMPARISON:  Chest radiograph 02/03/2019, 04/28/2018 FINDINGS: The heart size and mediastinal contours are within normal limits. There are diffuse bilateral interstitial opacities. There are small focal opacities in the right base and left mid lung. No pneumothorax or large pleural effusion. No acute finding in the visualized skeleton. IMPRESSION: 1. Diffuse bilateral interstitial opacities could represent reactive airways disease, atypical infection, or edema. 2. Small focal opacities in the right base and left mid lung may reflect superimposed atelectasis versus infection. Electronically Signed   By: Audie Pinto M.D.   On: 05/06/2019 18:26       Assessment & Plan:    Principal Problem:   Asthma exacerbation Active Problems:   Essential hypertension   GERD   CAP (community acquired pneumonia)   DM  type 2 (diabetes mellitus, type 2) ([place])   History of pancreatitis   Sepsis ([place])  Sepsis (fever, tachycardiac, leukocytosis, elevated lactic acid) secondary to CAP Blood culture x2 Urine strep antigen, urine legionella antigen vanco iv, levaquin iv pharmacy to dose  Copd/Asthma exacerbation Solumedrol 80mg  iv q8h prn  Levaquin 750mg  iv qday Cont Symbicort 2puff bid Albuterol HFA 2puff q6h prn   Hypertension Cont Lisinopril 2.5mg  po qday Pt states she is not taking amlodipine  Hyperlipidemia Cont Lipitor 20mg  po qhs  Dm2 Cont Metformin 1000mg  po bid fsbs ac and qhs, ISS  Hypothyroidism Cont Levothyroxine 25 micrograms po qday  Anxiety/ Depression Cont Buspar 10mg  po tid Cont Celexa 20mg  po qday Cont Elavil 100mg  po qhs Cont Lorazepam 0.5mg  po tid prn   Pancreatic insufficiency, h/o pancreatitis Cont Zenpep  H/o iron deficiency Cont Ferrous sulfate  Chronic back pain/ constipation Cont oxycodone 15mg  po qid Cont Robaxin 500mg  po tid Cont Colace Cont Miralax   DVT Prophylaxis-   SCDs  , no lovenox due to allergy  AM Labs Ordered, also please review Full Orders  Family Communication: Admission, patients condition and plan of care including tests being ordered have been discussed with the patient who indicate understanding and agree with the plan and Code Status.  Code Status:  FULL CODE per patient,  Left message for husband that patient admitted to [facility].  Admission status: Inpatient: Based on patients clinical presentation and evaluation of above clinical data, I have made determination that patient meets Inpatient criteria at this time. Pt has sepsis secondary to CAP,  And asthma/ copd exacerbation, pt will require iv abx, and iv steroids, and has high risk of clinical deterioration.  Pt will require >2 nites stay.   Time spent in minutes : 70 minutes   Jani Gravel M.D on 05/06/2019 at 9:21 PM

## 2019-05-06 NOTE — ED Notes (Signed)
Date and time results received: 05/06/19 1826 (use smartphrase ".now" to insert current time)  Test: lactic acid Critical Value: 2.8  Name of Provider Notified: [email or web address]  Orders Received? Or Actions Taken?:

## 2019-05-06 NOTE — ED Provider Notes (Signed)
[facility] EMERGENCY DEPARTMENT Provider Note   CSN: [id number] Arrival date & time: 05/06/19  1655     History   Chief Complaint Chief Complaint  Patient presents with  . Shortness of Breath    HPI [name] is a 48 y.o. female.     HPI  Pt was seen at 1735.  Per pt, c/o gradual onset and worsening of persistent cough, wheezing and SOB for the past 3 months, worse over the past week.  Describes her symptoms as "being like this since I had pneumonia in July." Pt states she has been evaluated by her PMD multiple times for same, tx empirically for asthma and pneumonia. Has been using home O2, MDI and nebs with only transient relief. States she has been on multiple rounds of antibiotics without sustained improvement.  Denies CP/palpitations, no back pain, no abd pain, no N/V/D, no fevers, no rash.    Past Medical History:  Diagnosis Date  . Anemia   . Anxiety   . Asthma   . Bipolar 1 disorder ([place])   . Chronic abdominal pain   . COPD (chronic obstructive pulmonary disease) ([place])   . Depression   . Diabetes mellitus   . Diabetic neuropathy ([place]) 10/29/2017  . Esophagitis   . Hiatal hernia   . Hyperlipidemia 02/03/2012  . Migraine   . Neck pain 06/08/2012  . Pancreatitis chronic Idiopathic   Treated by Dr. Newman Pies at [facility] in the past with celiac blocks.    Patient Active Problem List   Diagnosis Date Noted  . History of pancreatitis 05/06/2019  . Sepsis ([place]) 05/06/2019  . Diabetic neuropathy ([place]) 10/29/2017  . DM type 2 (diabetes mellitus, type 2) ([place]) 03/28/2014  . COPD exacerbation ([place]) 03/27/2014  . CAP (community acquired pneumonia) 06/29/2013  . Acute bronchitis 06/29/2013  . Anxiety 02/27/2013  . Abdominal cramping 02/27/2013  . BRBPR (bright red blood per rectum) 12/02/2012  . Constipation/Stool Impaction 12/02/2012  . Acute blood loss anemia 12/02/2012  . Pancreatitis 11/24/2012  . Hyperglycemia, drug-induced 06/08/2012  . Arm  paresthesia, left 06/07/2012  . Neck pain on left side 06/07/2012  . Asthma exacerbation 04/05/2012  . Acute otitis media 02/04/2012  . Hyperlipidemia 02/03/2012  . Acute respiratory failure with hypoxia ([place]) 09/23/2011  . Tobacco abuse 09/23/2011  . Constipation 08/29/2011  . Hypokalemia 07/15/2011  . Muscle spasm 04/24/2011  . Otitis externa 04/24/2011  . Idiopathic acute pancreatitis 04/24/2011  . ANEMIA, IRON DEFICIENCY 01/04/2009  . ANXIETY 01/04/2009  . Bipolar disorder ([place]) 01/04/2009  . MIGRAINE HEADACHE 01/04/2009  . Essential hypertension 01/04/2009  . ASTHMA 01/04/2009  . ESOPHAGEAL STRICTURE 01/04/2009  . GERD 01/04/2009  . HIATAL HERNIA 01/04/2009  . HEMATOCHEZIA 01/04/2009  . Diarrhea 01/04/2009  . ABDOMINAL PAIN 01/04/2009    Past Surgical History:  Procedure Laterality Date  . ABDOMINAL HYSTERECTOMY    . CELIAC PLEXUS BLOCK    . CHOLECYSTECTOMY    . COLONOSCOPY N/A 12/05/2012   [id number] rectum, colon and terminal ileum  . ESOPHAGOGASTRODUODENOSCOPY  02/20/2008   [id number] distal esophageal mucosa, suspicious for neoplasm/Hiatal hernia otherwise normal   . ESOPHAGOGASTRODUODENOSCOPY  04/03/2008   [id number] hiatal hernia, otherwise normal/Short, tight, benign-appearing peptic stricture  . ESOPHAGOGASTRODUODENOSCOPY  November 16, 2012   Dr. Britta Mccreedy: hiatal hernia, esophagitis,   . GIVENS CAPSULE STUDY N/A 12/05/2012   Few superficial erosions but nothing found to explain iron deficiency anemia.   . Ileocolonoscopy  06/05/2008     [id number] anal  canal, otherwise normal rectum, colon  . TONSILLECTOMY       OB History   No obstetric history on file.      Home Medications    Prior to Admission medications   Medication Sig Start Date End Date Taking? Authorizing Provider  albuterol (PROVENTIL HFA;VENTOLIN HFA) 108 (90 BASE) MCG/ACT inhaler Inhale 2 puffs into the lungs daily as needed for wheezing. For shortness of breath    [provider]  amitriptyline (ELAVIL) 100 MG tablet Take 100 mg by mouth at bedtime.    [provider]  amitriptyline (ELAVIL) 75 MG tablet Take 75 mg by mouth at bedtime.    [provider]  amLODipine (NORVASC) 10 MG tablet Take 10 mg by mouth at bedtime.     [provider]  atorvastatin (LIPITOR) 20 MG tablet Take 20 mg by mouth daily.    [provider]  budesonide-formoterol (SYMBICORT) 80-4.5 MCG/ACT inhaler Inhale 2 puffs into the lungs 2 (two) times daily. 03/30/14   Samuella Cota, MD  busPIRone (BUSPAR) 10 MG tablet Take 10 mg by mouth 3 (three) times daily.    [provider]  citalopram (CELEXA) 20 MG tablet Take 20 mg by mouth daily.    [provider]  docusate sodium (COLACE) 100 MG capsule Take 100 mg by mouth at bedtime.     [provider]  ferrous sulfate 325 (65 FE) MG tablet Take 325 mg by mouth daily.    [provider]  furosemide (LASIX) 20 MG tablet Take 20 mg by mouth.    [provider]  ipratropium-albuterol (DUONEB) 0.5-2.5 (3) MG/3ML SOLN Inhale 3 mLs into the lungs every 6 (six) hours as needed. Take 3 mLs by nebulization every 6 (six) hours as needed for Wheezing. 07/01/13   Ghimire, Henreitta Leber, MD  levothyroxine (SYNTHROID, LEVOTHROID) 25 MCG tablet Take 25 mcg by mouth daily before breakfast.    [provider]  lipase/protease/amylase (CREON) 12000 units CPEP capsule Take 2,000 Units by mouth 3 (three) times daily before meals.    [provider]  lisinopril (PRINIVIL,ZESTRIL) 2.5 MG tablet  01/18/18   [provider]  lisinopril (ZESTRIL) 2.5 MG tablet Take 2.5 mg by mouth daily.    [provider]  LORazepam (ATIVAN) 0.5 MG tablet Take 0.5 mg by mouth every 8 (eight) hours as needed for anxiety.     [provider]  metFORMIN (GLUCOPHAGE) 500 MG tablet Take 1,000 mg by mouth 2 (two) times daily with a meal.  07/01/13   Ghimire, Henreitta Leber,  MD  metFORMIN (GLUCOPHAGE) 500 MG tablet Take by mouth 4 (four) times daily.    [provider]  methocarbamol (ROBAXIN) 500 MG tablet Take 1 tablet (500 mg total) by mouth 2 (two) times daily. 10/29/17   Kathrynn Ducking, MD  methocarbamol (ROBAXIN) 500 MG tablet Take 500 mg by mouth 3 (three) times daily.    [provider]  oxycodone (ROXICODONE) 30 MG immediate release tablet Take 30 mg by mouth every 3 (three) hours as needed for pain.    [provider]  oxycodone (ROXICODONE) 30 MG immediate release tablet Take 30 mg by mouth every 8 (eight) hours as needed for pain.    [provider]  OXYGEN Inhale 3 L into the lungs daily as needed (for shortness of breath).    [provider]  Pancrelipase, Lip-Prot-Amyl, (ZENPEP) 20000 UNITS CPEP Take 2-3 capsules by mouth 5 (five) times daily.  3 capsules three times daily with each meal and 2 capsules with each snack.    [provider]  pantoprazole (PROTONIX) 40 MG tablet Take 40 mg by mouth 2 (two) times daily.    [provider]  polyethylene glycol powder (GLYCOLAX/MIRALAX) powder Take 17 g by mouth 2 (two) times daily. Until daily soft stools  OTC 03/05/15   Nona Dell, PA-C  promethazine (PHENERGAN) 25 MG tablet Take 25 mg by mouth every 6 (six) hours as needed for nausea or vomiting.    [provider]  valACYclovir (VALTREX) 500 MG tablet Take 500 mg by mouth daily.    [provider]    Family History Family History  Problem Relation Age of Onset  . Asthma Other   . Asthma Paternal Grandfather   . Heart attack Paternal Grandfather   . Hypertension Mother   . Cancer Maternal Grandmother   . Diabetes Paternal Grandmother   . Coronary artery disease Neg Hx   . Colon cancer Neg Hx     Social History Social History   Tobacco Use  . Smoking status: Former Research scientist (life sciences)  . Smokeless tobacco: Never Used  Substance Use Topics  . Alcohol use: No  .  Drug use: No     Allergies   Penicillins, Sulfa antibiotics, Aspirin, Lovenox  [enoxaparin sodium], and Prednisone   Review of Systems Review of Systems ROS: Statement: All systems negative except as marked or noted in the HPI; Constitutional: Negative for fever and chills. ; ; Eyes: Negative for eye pain, redness and discharge. ; ; ENMT: Negative for ear pain, hoarseness, nasal congestion, sinus pressure and sore throat. ; ; Cardiovascular: Negative for chest pain, palpitations, diaphoresis, dyspnea and peripheral edema. ; ; Respiratory: Negative for cough, wheezing and stridor. ; ; Gastrointestinal: Negative for nausea, vomiting, diarrhea, abdominal pain, blood in stool, hematemesis, jaundice and rectal bleeding. . ; ; Genitourinary: Negative for dysuria, flank pain and hematuria. ; ; Musculoskeletal: Negative for back pain and neck pain. Negative for swelling and trauma.; ; Skin: Negative for pruritus, rash, abrasions, blisters, bruising and skin lesion.; ; Neuro: Negative for headache, lightheadedness and neck stiffness. Negative for weakness, altered level of consciousness, altered mental status, extremity weakness, paresthesias, involuntary movement, seizure and syncope.      Physical Exam Updated Vital Signs BP 115/65   Pulse (!) 114   Temp 100.1 F (37.8 C) (Oral)   Resp 13   Ht 5\' 2"  (1.575 m)   Wt 55.3 kg   SpO2 98%   BMI 22.31 kg/m    Patient Vitals for the past 24 hrs:  BP Temp Temp src Pulse Resp SpO2 Height Weight  05/06/19 1958 - - - - - 98 % - -  05/06/19 1945 - - - (!) 114 13 97 % - -  05/06/19 1942 - - - - - 98 % - -  05/06/19 1930 115/65 - - - 12 - - -  05/06/19 1900 127/68 - - (!) 114 17 97 % - -  05/06/19 1830 126/72 - - (!) 113 (!) 32 99 % - -  05/06/19 1822 - - - - - 92 % - -  05/06/19 1800 116/68 - - (!) 108 14 93 % - -  05/06/19 1706 123/80 100.1 F (37.8 C) Oral (!) 125 18 (!) 88 % 5\' 2"  (1.575 m) 55.3 kg     Physical Exam 1740: Physical  examination:  Nursing notes reviewed; Vital signs and O2 SAT reviewed;  Constitutional: Well developed, Well nourished, Well hydrated, Uncomfortable appearing.;; Head:  Normocephalic, atraumatic; Eyes: EOMI, PERRL, No scleral icterus; ENMT: Mouth and pharynx normal, Mucous membranes moist; Neck: Supple, Full range of motion, No lymphadenopathy; Cardiovascular: Tachycardic rate and rhythm, No gallop; Respiratory: Breath sounds diminished & equal bilaterally, insp/exp wheezes bilat. Faint audible wheezing. Speaking sentences, sitting upright.; Chest: Nontender, Movement normal; Abdomen: Soft, Nontender, Nondistended, Normal bowel sounds; Genitourinary: No CVA tenderness; Extremities: Pulses normal, No tenderness, No edema, No calf edema or asymmetry.; Neuro: AA&Ox3, Major CN grossly intact.  Speech clear. No gross focal motor or sensory deficits in extremities.; Skin: Color normal, Warm, Dry.    ED Treatments / Results  Labs (all labs ordered are listed, but only abnormal results are displayed)   EKG EKG Interpretation  Date/Time:  Saturday May 06 2019 17:54:05 EDT Ventricular Rate:  114 PR Interval:    QRS Duration: 110 QT Interval:  310 QTC Calculation: 427 R Axis:   163 Text Interpretation:  Sinus tachycardia Probable right ventricular hypertrophy Borderline repolarization abnormality Baseline wander When compared with ECG of 03/05/2015 No significant change was found Confirmed by Francine Graven [phone]) on 05/06/2019 6:08:41 PM   Radiology   Procedures Procedures (including critical care time)  Medications Ordered in ED Medications  0.9 %  sodium chloride infusion ( Intravenous New Bag/Given 05/06/19 1954)  levofloxacin (LEVAQUIN) IVPB 750 mg (750 mg Intravenous New Bag/Given 05/06/19 1956)  methylPREDNISolone sodium succinate (SOLU-MEDROL) 125 mg/2 mL injection 80 mg (has no administration in time range)  insulin aspart (novoLOG) injection 0-9 Units (has no administration in  time range)  insulin aspart (novoLOG) injection 0-5 Units (has no administration in time range)  albuterol (VENTOLIN HFA) 108 (90 Base) MCG/ACT inhaler 8 puff (8 puffs Inhalation Given 05/06/19 1822)  albuterol (PROVENTIL,VENTOLIN) solution continuous neb (10 mg/hr Nebulization Given 05/06/19 1957)  ipratropium (ATROVENT) nebulizer solution 1 mg (1 mg Nebulization Given 05/06/19 1958)  sodium chloride 0.9 % bolus 1,000 mL (1,000 mLs Intravenous New Bag/Given 05/06/19 1955)  methylPREDNISolone sodium succinate (SOLU-MEDROL) 125 mg/2 mL injection 125 mg (125 mg Intravenous Given 05/06/19 1956)     Initial Impression / Assessment and Plan / ED Course  I have reviewed the triage vital signs and the nursing notes.  Pertinent labs & imaging results that were available during my care of the patient were reviewed by me and considered in my medical decision making (see chart for details).    MDM Reviewed: previous chart, nursing note and vitals Reviewed previous: labs and ECG Interpretation: labs, ECG and x-ray Total time providing critical care: 30-74 minutes. This excludes time spent performing separately reportable procedures and services. Consults: admitting MD    CRITICAL CARE Performed by: Francine Graven Total critical care time: 45 minutes Critical care time was exclusive of separately billable procedures and treating other patients. Critical care was necessary to treat or prevent imminent or life-threatening deterioration. Critical care was time spent personally by me on the following activities: development of treatment plan with patient and/or surrogate as well as nursing, discussions with consultants, evaluation of patient's response to treatment, examination of patient, obtaining history from patient or surrogate, ordering and performing treatments and interventions, ordering and review of laboratory studies, ordering and review of radiographic studies, pulse oximetry and  re-evaluation of patient's condition.  Results for orders placed or performed during the hospital encounter of 05/06/19  SARS Coronavirus 2 by RT PCR (hospital order, performed in [facility] hospital lab) Nasopharyngeal Nasopharyngeal Swab   Specimen:  Nasopharyngeal Swab  Result Value Ref Range   SARS Coronavirus 2 NEGATIVE NEGATIVE  Basic metabolic panel  Result Value Ref Range   Sodium 139 135 - 145 mmol/L   Potassium 4.1 3.5 - 5.1 mmol/L   Chloride 101 98 - 111 mmol/L   CO2 24 22 - 32 mmol/L   Glucose, Bld 95 70 - 99 mg/dL   BUN 12 6 - 20 mg/dL   Creatinine, Ser 0.74 0.44 - 1.00 mg/dL   Calcium 9.5 8.9 - 10.3 mg/dL   GFR calc non Af Amer >60 >60 mL/min   GFR calc Af Amer >60 >60 mL/min   Anion gap 14 5 - 15  Brain natriuretic peptide  Result Value Ref Range   B Natriuretic Peptide 122.0 (H) 0.0 - 100.0 pg/mL  Lactic acid, plasma  Result Value Ref Range   Lactic Acid, Venous 2.8 (HH) 0.5 - 1.9 mmol/L  Lactic acid, plasma  Result Value Ref Range   Lactic Acid, Venous 2.6 (HH) 0.5 - 1.9 mmol/L  CBC with Differential  Result Value Ref Range   WBC 19.8 (H) 4.0 - 10.5 K/uL   RBC 3.66 (L) 3.87 - 5.11 MIL/uL   Hemoglobin 8.5 (L) 12.0 - 15.0 g/dL   HCT 30.0 (L) 36.0 - 46.0 %   MCV 82.0 80.0 - 100.0 fL   MCH 23.2 (L) 26.0 - 34.0 pg   MCHC 28.3 (L) 30.0 - 36.0 g/dL   RDW 18.3 (H) 11.5 - 15.5 %   Platelets 420 (H) 150 - 400 K/uL   nRBC 0.1 0.0 - 0.2 %   Neutrophils Relative % 80 %   Neutro Abs 15.7 (H) 1.7 - 7.7 K/uL   Lymphocytes Relative 7 %   Lymphs Abs 1.4 0.7 - 4.0 K/uL   Monocytes Relative 6 %   Monocytes Absolute 1.1 (H) 0.1 - 1.0 K/uL   Eosinophils Relative 7 %   Eosinophils Absolute 1.4 (H) 0.0 - 0.5 K/uL   Basophils Relative 0 %   Basophils Absolute 0.1 0.0 - 0.1 K/uL   Immature Granulocytes 0 %   Abs Immature Granulocytes 0.08 (H) 0.00 - 0.07 K/uL  Urinalysis, Routine w reflex microscopic  Result Value Ref Range   Color, Urine COLORLESS (A) YELLOW    APPearance CLEAR CLEAR   Specific Gravity, Urine 1.004 (L) 1.005 - 1.030   pH 5.0 5.0 - 8.0   Glucose, UA NEGATIVE NEGATIVE mg/dL   Hgb urine dipstick NEGATIVE NEGATIVE   Bilirubin Urine NEGATIVE NEGATIVE   Ketones, ur NEGATIVE NEGATIVE mg/dL   Protein, ur NEGATIVE NEGATIVE mg/dL   Nitrite NEGATIVE NEGATIVE   Leukocytes,Ua NEGATIVE NEGATIVE  Troponin I (High Sensitivity)  Result Value Ref Range   Troponin I (High Sensitivity) <2.0 <18 ng/L  Troponin I (High Sensitivity)  Result Value Ref Range   Troponin I (High Sensitivity) <2.0 <18 ng/L   Dg Chest Port 1 View Result Date: 05/06/2019 CLINICAL DATA:  Pt states she has asthma and has been battling pneumonia since a hospital admission in July. Pt seen PCP at [facility] Thursday has been on multiple rounds of antibiotics. Follow-up with PCP scheduled next week. EXAM: PORTABLE CHEST 1 VIEW COMPARISON:  Chest radiograph 02/03/2019, 04/28/2018 FINDINGS: The heart size and mediastinal contours are within normal limits. There are diffuse bilateral interstitial opacities. There are small focal opacities in the right base and left mid lung. No pneumothorax or large pleural effusion. No acute finding in the visualized skeleton. IMPRESSION: 1. Diffuse bilateral interstitial opacities  could represent reactive airways disease, atypical infection, or edema. 2. Small focal opacities in the right base and left mid lung may reflect superimposed atelectasis versus infection. Electronically Signed   By: Audie Pinto M.D.   On: 05/06/2019 18:26    [name] was evaluated in Emergency Department on 05/06/2019 for the symptoms described in the history of present illness. She was evaluated in the context of the global COVID-19 pandemic, which necessitated consideration that the patient might be at risk for infection with the SARS-CoV-2 virus that causes COVID-19. Institutional protocols and algorithms that pertain to the evaluation of patients at risk for  COVID-19 are in a state of rapid change based on information released by regulatory bodies including the CDC and federal and state organizations. These policies and algorithms were followed during the patient's care in the ED.   1935:  Covid negative. IV solumedrol and hour long neb started. IVF bolus given for mildly elevated lactic acid with improvement. Pt's O2 Sats 88% on arrival, have been in low 90's with O2 N/C. Pt will need admit. T/C returned from [facility] Dr. Maudie Mercury, case discussed, including:  HPI, pertinent PM/SHx, VS/PE, dx testing, ED course and treatment:  Agreeable to admit.      Final Clinical Impressions(s) / ED Diagnoses   Final diagnoses:  None    ED Discharge Orders    None       Francine Graven, DO 05/10/19 2324

## 2019-05-07 DIAGNOSIS — D508 Other iron deficiency anemias: Secondary | ICD-10-CM

## 2019-05-07 DIAGNOSIS — K921 Melena: Secondary | ICD-10-CM

## 2019-05-07 LAB — CBC
HCT: 22.5 % — ABNORMAL LOW (ref 36.0–46.0)
Hemoglobin: 6.6 g/dL — CL (ref 12.0–15.0)
MCH: 23.4 pg — ABNORMAL LOW (ref 26.0–34.0)
MCHC: 29.3 g/dL — ABNORMAL LOW (ref 30.0–36.0)
MCV: 79.8 fL — ABNORMAL LOW (ref 80.0–100.0)
Platelets: 319 10*3/uL (ref 150–400)
RBC: 2.82 MIL/uL — ABNORMAL LOW (ref 3.87–5.11)
RDW: 18 % — ABNORMAL HIGH (ref 11.5–15.5)
WBC: 10.4 10*3/uL (ref 4.0–10.5)
nRBC: 0 % (ref 0.0–0.2)

## 2019-05-07 LAB — GLUCOSE, CAPILLARY
Glucose-Capillary: 188 mg/dL — ABNORMAL HIGH (ref 70–99)
Glucose-Capillary: 216 mg/dL — ABNORMAL HIGH (ref 70–99)
Glucose-Capillary: 106 mg/dL — ABNORMAL HIGH (ref 70–99)
Glucose-Capillary: 231 mg/dL — ABNORMAL HIGH (ref 70–99)

## 2019-05-07 LAB — COMPREHENSIVE METABOLIC PANEL
ALT: 12 U/L (ref 0–44)
AST: 15 U/L (ref 15–41)
Albumin: 2.8 g/dL — ABNORMAL LOW (ref 3.5–5.0)
Alkaline Phosphatase: 82 U/L (ref 38–126)
Anion gap: 12 (ref 5–15)
BUN: 11 mg/dL (ref 6–20)
CO2: 24 mmol/L (ref 22–32)
Calcium: 8.4 mg/dL — ABNORMAL LOW (ref 8.9–10.3)
Chloride: 104 mmol/L (ref 98–111)
Creatinine, Ser: 0.63 mg/dL (ref 0.44–1.00)
GFR calc Af Amer: >60 mL/min (ref >60)
GFR calc non Af Amer: >60 mL/min (ref >60)
Glucose, Bld: 199 mg/dL — ABNORMAL HIGH (ref 70–99)
Potassium: 3.9 mmol/L (ref 3.5–5.1)
Sodium: 140 mmol/L (ref 135–145)
Total Bilirubin: 0.2 mg/dL — ABNORMAL LOW (ref 0.3–1.2)
Total Protein: 6.1 g/dL — ABNORMAL LOW (ref 6.5–8.1)

## 2019-05-07 LAB — IRON AND TIBC
Iron: 16 ug/dL — ABNORMAL LOW (ref 28–170)
Saturation Ratios: 4 % — ABNORMAL LOW (ref 10.4–31.8)
TIBC: 378 ug/dL (ref 250–450)
UIBC: 362 ug/dL

## 2019-05-07 LAB — RETICULOCYTES
Immature Retic Fract: 26.6 % — ABNORMAL HIGH (ref 2.3–15.9)
RBC.: 2.97 MIL/uL — ABNORMAL LOW (ref 3.87–5.11)
Retic Count, Absolute: 35 10*3/uL (ref 19.0–186.0)
Retic Ct Pct: 1.2 % (ref 0.4–3.1)

## 2019-05-07 LAB — HIV ANTIBODY (ROUTINE TESTING W REFLEX): HIV Screen 4th Generation wRfx: NONREACTIVE

## 2019-05-07 LAB — VITAMIN B12: Vitamin B-12: 142 pg/mL — ABNORMAL LOW (ref 180–914)

## 2019-05-07 LAB — STREP PNEUMONIAE URINARY ANTIGEN: Strep Pneumo Urinary Antigen: NEGATIVE

## 2019-05-07 LAB — FOLATE: Folate: 32.5 ng/mL (ref >5.9)

## 2019-05-07 LAB — HEMOGLOBIN AND HEMATOCRIT, BLOOD
HCT: 29.1 % — ABNORMAL LOW (ref 36.0–46.0)
Hemoglobin: 8.5 g/dL — ABNORMAL LOW (ref 12.0–15.0)

## 2019-05-07 LAB — PREPARE RBC (CROSSMATCH)

## 2019-05-07 LAB — FERRITIN: Ferritin: 6 ng/mL — ABNORMAL LOW (ref 11–307)

## 2019-05-07 MED ORDER — PANTOPRAZOLE SODIUM 40 MG IV SOLR
40.0000 mg | Freq: Two times a day (BID) | INTRAVENOUS | Status: DC
  Administered 2019-05-07 – 2019-05-08 (×4): 40 mg via INTRAVENOUS
  Filled 2019-05-07 (×4): qty 40

## 2019-05-07 MED ORDER — IPRATROPIUM-ALBUTEROL 0.5-2.5 (3) MG/3ML IN SOLN
3.0000 mL | Freq: Four times a day (QID) | RESPIRATORY_TRACT | Status: DC
  Administered 2019-05-07 – 2019-05-08 (×5): 3 mL via RESPIRATORY_TRACT
  Filled 2019-05-07 (×6): qty 3

## 2019-05-07 MED ORDER — ENSURE ENLIVE PO LIQD
237.0000 mL | Freq: Two times a day (BID) | ORAL | Status: DC
  Administered 2019-05-07 – 2019-05-08 (×4): 237 mL via ORAL

## 2019-05-07 MED ORDER — PANCRELIPASE (LIP-PROT-AMYL) 12000-38000 UNITS PO CPEP
48000.0000 [IU] | ORAL_CAPSULE | Freq: Three times a day (TID) | ORAL | Status: DC
  Administered 2019-05-07 – 2019-05-08 (×6): 48000 [IU] via ORAL
  Filled 2019-05-07 (×5): qty 4

## 2019-05-07 MED ORDER — SODIUM CHLORIDE 0.9 % IV SOLN
510.0000 mg | Freq: Once | INTRAVENOUS | Status: DC
  Filled 2019-05-07: qty 17

## 2019-05-07 MED ORDER — CYANOCOBALAMIN 1000 MCG/ML IJ SOLN
1000.0000 ug | Freq: Once | INTRAMUSCULAR | Status: AC
  Administered 2019-05-07: 1000 ug via INTRAMUSCULAR
  Filled 2019-05-07: qty 1

## 2019-05-07 MED ORDER — NICOTINE 7 MG/24HR TD PT24
7.0000 mg | MEDICATED_PATCH | Freq: Every day | TRANSDERMAL | Status: DC
  Administered 2019-05-07: 7 mg via TRANSDERMAL
  Filled 2019-05-07 (×3): qty 1

## 2019-05-07 MED ORDER — SODIUM CHLORIDE 0.9% IV SOLUTION
Freq: Once | INTRAVENOUS | Status: AC
  Administered 2019-05-07: 12:00:00 via INTRAVENOUS

## 2019-05-07 MED ORDER — METHYLPREDNISOLONE SODIUM SUCC 40 MG IJ SOLR
40.0000 mg | Freq: Two times a day (BID) | INTRAMUSCULAR | Status: DC
  Administered 2019-05-07 – 2019-05-09 (×4): 40 mg via INTRAVENOUS
  Filled 2019-05-07 (×4): qty 1

## 2019-05-07 MED ORDER — POLYETHYLENE GLYCOL 3350 17 G PO PACK
17.0000 g | PACK | Freq: Two times a day (BID) | ORAL | Status: DC
  Administered 2019-05-07 – 2019-05-09 (×5): 17 g via ORAL
  Filled 2019-05-07 (×4): qty 1

## 2019-05-07 MED ORDER — PANCRELIPASE (LIP-PROT-AMYL) 12000-38000 UNITS PO CPEP
48000.0000 [IU] | ORAL_CAPSULE | Freq: Two times a day (BID) | ORAL | Status: DC | PRN
  Filled 2019-05-07: qty 4

## 2019-05-07 NOTE — Progress Notes (Signed)
PROGRESS NOTE    [name]  [id number] DOB: March 12, 1971 DOA: 05/06/2019 PCP: [employer], MD   Brief Narrative:  Per HPI: [name]  is a 48 y.o. female, w Hypertension, Hyperlipidemia, Dm2, w neuropathy, Gerd, Asthma/ Copd presents w dyspnea for the past 4 weeks worse today, along with dry cough, and subjective fever.  Pt denies cp, palp, n/v, abd pain, diarrhea, brbpr, dysuria, hematuria.   Patient was admitted for community-acquired pneumonia with early sepsis as well as COPD/asthma exacerbation.  10/11: Patient appears to be doing well this morning and states that she is much less short of breath and nearing her baseline, unfortunately she has had decreasing hemoglobin levels and states that she has been more lightheaded recently with darker stools.  She states that she gets chronic pain injections for chronic pancreatitis and has been noted to have upper GI ulcerations previously.  Assessment & Plan:   Principal Problem:   Asthma exacerbation Active Problems:   Essential hypertension   GERD   CAP (community acquired pneumonia)   DM type 2 (diabetes mellitus, type 2) ([place])   History of pancreatitis   Sepsis ([place])   Sepsis secondary to community-acquired pneumonia -Blood cultures currently pending -Urine strep pneumonia negative and Legionella pending -Continue Levaquin as currently prescribed -Monitor CBC and lactic acid in a.m.  Acute COPD exacerbation secondary to above -Patient does have chronic hypoxemia and remains on 3 L which she is on currently -Wean Solu-Medrol to 40 mg IV twice daily -Continue Symbicort -Albuterol every 6 hours as needed for shortness of breath or wheezing  Acute blood loss anemia -1 unit PRBC transfusion now -PPI IV twice daily -GI consultation for endoscopy -Has history of iron deficiency with anemia panel demonstrating severe iron deficiency for which I will order Feraheme -B12 deficiency for which I will administer  subcutaneous B12  Hypertension-controlled -Continue lisinopril and hold amlodipine which she does not take  Dyslipidemia -Continue Lipitor  Type 2 diabetes with mild steroid-induced hyperglycemia -Continue metformin as well as sliding scale insulin -Monitor carefully  Hypothyroidism -Continue levothyroxine  Anxiety/depression -Continue current meds  Pancreatic insufficiency with history of chronic pancreatitis -Continue pancreatic enzymes while hospitalized  Chronic back pain/constipation -Continue oxycodone and Robaxin -Continue Colace and MiraLAX  DVT prophylaxis: SCDs Code Status: Full Family Communication: None at bedside Disposition Plan: GI evaluation pending for evaluation of bleeding.  Continue on steroids and albuterol treatments as needed.  Continue current antibiotics.  Plan to transfuse 1 unit PRBCs today and monitor CBC.   Consultants:   GI  Procedures:   None  Antimicrobials:  Anti-infectives (From admission, onward)   Start     Dose/Rate Route Frequency Ordered Stop   05/07/19 1000  valACYclovir (VALTREX) tablet 500 mg     500 mg Oral Daily 05/06/19 2351     05/06/19 1930  levofloxacin (LEVAQUIN) IVPB 750 mg     750 mg 100 mL/hr over 90 Minutes Intravenous Every 24 hours 05/06/19 1926 05/11/19 1929       Subjective: Patient seen and evaluated today with no new acute complaints or concerns. No acute concerns or events noted overnight.  She states that she is overall feeling better from shortness of breath standpoint.  She does relate that she has been having some lightheadedness and dizziness at home recently and does have issues with recurrent anemia related to gastric ulcers.  Objective: Vitals:   05/07/19 0521 05/07/19 0522 05/07/19 0802 05/07/19 1115  BP: (!) 111/49 (!) 111/49 120/69 (P)  112/64  Pulse: (!) 102 (!) 102  (!) (P) 109  Resp: 18 18  (P) 18  Temp: 98.4 F (36.9 C) 98.4 F (36.9 C)  (P) 98 F (36.7 C)  TempSrc: Oral Oral  (P)  Oral  SpO2: 94% 94%  (P) 97%  Weight:      Height:        Intake/Output Summary (Last 24 hours) at 05/07/2019 1124 Last data filed at 05/07/2019 1115 Gross per 24 hour  Intake 1903.81 ml  Output -  Net 1903.81 ml   Filed Weights   05/06/19 1706  Weight: 55.3 kg    Examination:  General exam: Appears calm and comfortable  Respiratory system: Clear to auscultation. Respiratory effort normal.  Currently on 3 L nasal cannula oxygen. Cardiovascular system: S1 & S2 heard, RRR. No JVD, murmurs, rubs, gallops or clicks. No pedal edema. Gastrointestinal system: Abdomen is nondistended, soft and nontender. No organomegaly or masses felt. Normal bowel sounds heard. Central nervous system: Alert and oriented. No focal neurological deficits. Extremities: Symmetric 5 x 5 power. Skin: No rashes, lesions or ulcers Psychiatry: Judgement and insight appear normal. Mood & affect appropriate.     Data Reviewed: I have personally reviewed following labs and imaging studies  CBC: Recent Labs  Lab 05/06/19 1746 05/07/19 0624  WBC 19.8* 10.4  NEUTROABS 15.7*  --   HGB 8.5* 6.6*  HCT 30.0* 22.5*  MCV 82.0 79.8*  PLT 420* [id number]   Basic Metabolic Panel: Recent Labs  Lab 05/06/19 1746 05/07/19 0624  NA 139 140  K 4.1 3.9  CL 101 104  CO2 24 24  GLUCOSE 95 199*  BUN 12 11  CREATININE 0.74 0.63  CALCIUM 9.5 8.4*   GFR: Estimated Creatinine Clearance: 66.5 mL/min (by C-G formula based on SCr of 0.63 mg/dL). Liver Function Tests: Recent Labs  Lab 05/07/19 0624  AST 15  ALT 12  ALKPHOS 82  BILITOT 0.2*  PROT 6.1*  ALBUMIN 2.8*   No results for input(s): LIPASE, AMYLASE in the last 168 hours. No results for input(s): AMMONIA in the last 168 hours. Coagulation Profile: No results for input(s): INR, PROTIME in the last 168 hours. Cardiac Enzymes: No results for input(s): CKTOTAL, CKMB, CKMBINDEX, TROPONINI in the last 168 hours. BNP (last 3 results) No results for input(s):  PROBNP in the last 8760 hours. HbA1C: No results for input(s): HGBA1C in the last 72 hours. CBG: Recent Labs  Lab 05/06/19 2311 05/07/19 0740 05/07/19 1105  GLUCAP 247* 188* 216*   Lipid Profile: No results for input(s): CHOL, HDL, LDLCALC, TRIG, CHOLHDL, LDLDIRECT in the last 72 hours. Thyroid Function Tests: No results for input(s): TSH, T4TOTAL, FREET4, T3FREE, THYROIDAB in the last 72 hours. Anemia Panel: Recent Labs    05/07/19 0624 05/07/19 0828  VITAMINB12 142*  --   FOLATE  --  32.5  FERRITIN 6*  --   TIBC 378  --   IRON 16*  --   RETICCTPCT  --  1.2   Sepsis Labs: Recent Labs  Lab 05/06/19 1746 05/06/19 1930  LATICACIDVEN 2.8* 2.6*    Recent Results (from the past 240 hour(s))  Culture, blood (routine x 2) Call MD if unable to obtain prior to antibiotics being given     Status: None (Preliminary result)   Collection Time: 05/06/19  5:44 PM   Specimen: BLOOD RIGHT ARM  Result Value Ref Range Status   Specimen Description BLOOD RIGHT ARM  Final   Special Requests  Final    BOTTLES DRAWN AEROBIC AND ANAEROBIC Blood Culture adequate volume Performed at [facility], [address], [place], [place] [address]    Culture PENDING  Incomplete   Report Status PENDING  Incomplete  SARS Coronavirus 2 by RT PCR (hospital order, performed in [facility] hospital lab) Nasopharyngeal Nasopharyngeal Swab     Status: None   Collection Time: 05/06/19  5:46 PM   Specimen: Nasopharyngeal Swab  Result Value Ref Range Status   SARS Coronavirus 2 NEGATIVE NEGATIVE Final    Comment: (NOTE) If result is NEGATIVE SARS-CoV-2 target nucleic acids are NOT DETECTED. The SARS-CoV-2 RNA is generally detectable in upper and lower  respiratory specimens during the acute phase of infection. The lowest  concentration of SARS-CoV-2 viral copies this assay can detect is 250  copies / mL. A negative result does not preclude SARS-CoV-2 infection  and should not be used as the sole basis  for treatment or other  patient management decisions.  A negative result may occur with  improper specimen collection / handling, submission of specimen other  than nasopharyngeal swab, presence of viral mutation(s) within the  areas targeted by this assay, and inadequate number of viral copies  (<250 copies / mL). A negative result must be combined with clinical  observations, patient history, and epidemiological information. If result is POSITIVE SARS-CoV-2 target nucleic acids are DETECTED. The SARS-CoV-2 RNA is generally detectable in upper and lower  respiratory specimens dur ing the acute phase of infection.  Positive  results are indicative of active infection with SARS-CoV-2.  Clinical  correlation with patient history and other diagnostic information is  necessary to determine patient infection status.  Positive results do  not rule out bacterial infection or co-infection with other viruses. If result is PRESUMPTIVE POSTIVE SARS-CoV-2 nucleic acids MAY BE PRESENT.   A presumptive positive result was obtained on the submitted specimen  and confirmed on repeat testing.  While 2019 novel coronavirus  (SARS-CoV-2) nucleic acids may be present in the submitted sample  additional confirmatory testing may be necessary for epidemiological  and / or clinical management purposes  to differentiate between  SARS-CoV-2 and other Sarbecovirus currently known to infect humans.  If clinically indicated additional testing with an alternate test  methodology [phone]) is advised. The SARS-CoV-2 RNA is generally  detectable in upper and lower respiratory sp ecimens during the acute  phase of infection. The expected result is Negative. Fact Sheet for Patients:  StrictlyIdeas.no Fact Sheet for Healthcare Providers: BankingDealers.co.za This test is not yet approved or cleared by the [place] FDA and has been authorized for detection and/or  diagnosis of SARS-CoV-2 by FDA under an Emergency Use Authorization (EUA).  This EUA will remain in effect (meaning this test can be used) for the duration of the COVID-19 declaration under Section 564(b)(1) of the Act, 21 U.S.C. section 360bbb-3(b)(1), unless the authorization is terminated or revoked sooner. Performed at [facility], [address] [place], [place] [address]   Culture, blood (routine x 2) Call MD if unable to obtain prior to antibiotics being given     Status: None (Preliminary result)   Collection Time: 05/06/19  7:30 PM   Specimen: Left Antecubital; Blood  Result Value Ref Range Status   Specimen Description LEFT ANTECUBITAL  Final   Special Requests   Final    BOTTLES DRAWN AEROBIC ONLY Blood Culture results may not be optimal due to an inadequate volume of blood received in culture bottles Performed at  [facility], [address], [place], [place] [address]    Culture PENDING  Incomplete   Report Status PENDING  Incomplete         Radiology Studies: Dg Chest Port 1 View  Result Date: 05/06/2019 CLINICAL DATA:  Pt states she has asthma and has been battling pneumonia since a hospital admission in July. Pt seen PCP at [facility] Thursday has been on multiple rounds of antibiotics. Follow-up with PCP scheduled next week. EXAM: PORTABLE CHEST 1 VIEW COMPARISON:  Chest radiograph 02/03/2019, 04/28/2018 FINDINGS: The heart size and mediastinal contours are within normal limits. There are diffuse bilateral interstitial opacities. There are small focal opacities in the right base and left mid lung. No pneumothorax or large pleural effusion. No acute finding in the visualized skeleton. IMPRESSION: 1. Diffuse bilateral interstitial opacities could represent reactive airways disease, atypical infection, or edema. 2. Small focal opacities in the right base and left mid lung may reflect superimposed atelectasis versus infection. Electronically Signed   By: Audie Pinto M.D.    On: 05/06/2019 18:26        Scheduled Meds: . sodium chloride   Intravenous Once  . amitriptyline  100 mg Oral QHS  . atorvastatin  20 mg Oral Daily  . busPIRone  10 mg Oral TID  . citalopram  20 mg Oral Daily  . cyanocobalamin  1,000 mcg Intramuscular Once  . docusate sodium  100 mg Oral QHS  . feeding supplement (ENSURE ENLIVE)  237 mL Oral BID BM  . ferrous sulfate  325 mg Oral Daily  . furosemide  20 mg Oral Daily  . insulin aspart  0-5 Units Subcutaneous QHS  . insulin aspart  0-9 Units Subcutaneous TID WC  . levothyroxine  25 mcg Oral QAC breakfast  . lipase/protease/amylase  48,000 Units Oral TID WC  . lisinopril  2.5 mg Oral Daily  . metFORMIN  1,000 mg Oral BID WC  . methocarbamol  500 mg Oral TID  . methylPREDNISolone (SOLU-MEDROL) injection  40 mg Intravenous Q12H  . mometasone-formoterol  2 puff Inhalation BID  . pantoprazole (PROTONIX) IV  40 mg Intravenous Q12H  . polyethylene glycol  17 g Oral BID  . valACYclovir  500 mg Oral Daily   Continuous Infusions: . ferumoxytol    . levofloxacin (LEVAQUIN) IV 750 mg (05/06/19 1956)     LOS: 1 day    Time spent: 30 minutes    Krisanne Lich Darleen Crocker, DO [facility] Pager [phone]  If 7PM-7AM, please contact night-coverage [email or web address] Password [email or web address] 05/07/2019, 11:24 AM

## 2019-05-07 NOTE — Consult Note (Signed)
Referring Provider: No ref. provider found Primary Care Physician:  Redmond School, MD Primary Gastroenterologist:  Dr. Newman Pies  Reason for Consultation: Dark stools; iron deficiency anemia  HPI: Pleasant 48 year old lady with a history of COPD/asthma admitted with worsening shortness of breath, fever and wheezing.  Pulmonary infiltrates consistent with community-acquired pneumonia.  Admission white count 19,800; hemoglobin 8.5; today white count 10.4; hemoglobin 6.6 microcytic indices.  Ferritin 6.  Patient reports chronically dark stools oral iron.  She states recently that got a little more dark in color.  Stool occult blood testing results not yet available.  Patient's past medical history longstanding for iron deficiency anemia.  Worked up here and in [place] back in 2014.  Dr. Britta Mccreedy EGD LA grade A reflux esophagitis.  Negative colonoscopy by me here that same year.  Capsule study the small bowel demonstrated only a couple of small bowel erosions.  History significant for chronic pancreatitis.  Patient is undergone multiple EUS is with celiac blocks by Dr. Steward Drone [facility].  The last one was about 4 months ago.  They have been working fairly well. Patient reports Dr. Newman Pies told her previously she had severe reflux esophagitis and an ulcer in her "upper small intestine". Pertinent records are currently not available. Patient states she is not having any reflux symptoms now and is not taking her Protonix although she has been taking her pancreatic enzymes.  She denies dysphagia, nausea or vomiting.  She denies abdominal pain;  denies hematochezia.  States weight stable.  No diarrhea. No NSAID use.   Past Medical History:  Diagnosis Date  . Anemia   . Anxiety   . Asthma   . Bipolar 1 disorder ([place])   . Chronic abdominal pain   . COPD (chronic obstructive pulmonary disease) ([place])   . Depression   . Diabetes mellitus   . Diabetic neuropathy ([place]) 10/29/2017  .  Esophagitis   . Hiatal hernia   . Hyperlipidemia 02/03/2012  . Migraine   . Neck pain 06/08/2012  . Pancreatitis chronic Idiopathic   Treated by Dr. Newman Pies at [facility] in the past with celiac blocks.    Past Surgical History:  Procedure Laterality Date  . ABDOMINAL HYSTERECTOMY    . CELIAC PLEXUS BLOCK    . CHOLECYSTECTOMY    . COLONOSCOPY N/A 12/05/2012   [id number] rectum, colon and terminal ileum  . ESOPHAGOGASTRODUODENOSCOPY  02/20/2008   [id number] distal esophageal mucosa, suspicious for neoplasm/Hiatal hernia otherwise normal   . ESOPHAGOGASTRODUODENOSCOPY  04/03/2008   [id number] hiatal hernia, otherwise normal/Short, tight, benign-appearing peptic stricture  . ESOPHAGOGASTRODUODENOSCOPY  November 16, 2012   Dr. Britta Mccreedy: hiatal hernia, esophagitis,   . GIVENS CAPSULE STUDY N/A 12/05/2012   Few superficial erosions but nothing found to explain iron deficiency anemia.   . Ileocolonoscopy  06/05/2008     [id number] anal canal, otherwise normal rectum, colon  . TONSILLECTOMY      Prior to Admission medications   Medication Sig Start Date End Date Taking? Authorizing Provider  albuterol (PROVENTIL HFA;VENTOLIN HFA) 108 (90 BASE) MCG/ACT inhaler Inhale 2 puffs into the lungs daily as needed for wheezing. For shortness of breath   Yes [provider]  amitriptyline (ELAVIL) 100 MG tablet Take 100 mg by mouth at bedtime.   Yes [provider]  atorvastatin (LIPITOR) 20 MG tablet Take 20 mg by mouth daily.   Yes [provider]  budesonide-formoterol (SYMBICORT) 80-4.5 MCG/ACT inhaler Inhale 2 puffs into the lungs 2 (two) times  daily. 03/30/14  Yes Samuella Cota, MD  busPIRone (BUSPAR) 10 MG tablet Take 10 mg by mouth 3 (three) times daily.   Yes [provider]  citalopram (CELEXA) 20 MG tablet Take 20 mg by mouth daily.   Yes [provider]  docusate sodium (COLACE) 100 MG capsule Take 100 mg by mouth at bedtime.     Yes [provider]  ferrous sulfate 325 (65 FE) MG tablet Take 325 mg by mouth daily.   Yes [provider]  furosemide (LASIX) 20 MG tablet Take 20 mg by mouth.   Yes [provider]  gabapentin (NEURONTIN) 300 MG capsule Take 300 mg by mouth 3 (three) times daily.  04/11/19  Yes [provider]  ipratropium-albuterol (DUONEB) 0.5-2.5 (3) MG/3ML SOLN Inhale 3 mLs into the lungs every 6 (six) hours as needed. Take 3 mLs by nebulization every 6 (six) hours as needed for Wheezing. Patient taking differently: Inhale 3 mLs into the lungs every 6 (six) hours as needed (for wheezing).  07/01/13  Yes Ghimire, Henreitta Leber, MD  levothyroxine (SYNTHROID, LEVOTHROID) 25 MCG tablet Take 25 mcg by mouth daily before breakfast.   Yes [provider]  lipase/protease/amylase (CREON) 12000 units CPEP capsule Take 12,000 Units by mouth 3 (three) times daily before meals.    Yes [provider]  lisinopril (PRINIVIL,ZESTRIL) 2.5 MG tablet Take 2.5 mg by mouth daily.  01/18/18  Yes [provider]  metFORMIN (GLUCOPHAGE) 500 MG tablet Take 1,000 mg by mouth 2 (two) times daily with a meal.  07/01/13  Yes Ghimire, Henreitta Leber, MD  methocarbamol (ROBAXIN) 500 MG tablet Take 500 mg by mouth 3 (three) times daily.   Yes [provider]  oxycodone (ROXICODONE) 30 MG immediate release tablet Take 30 mg by mouth 3 (three) times daily.    Yes [provider]  OXYGEN Inhale 3 L into the lungs daily as needed (for shortness of breath).   Yes [provider]  Pancrelipase, Lip-Prot-Amyl, (ZENPEP) 20000 UNITS CPEP Take 2-3 capsules by mouth 5 (five) times daily. 3 capsules three times daily with each meal and 2 capsules with each snack.   Yes [provider]  pantoprazole (PROTONIX) 40 MG tablet Take 40 mg by mouth 2 (two) times daily.   Yes [provider]  polyethylene glycol powder (GLYCOLAX/MIRALAX) powder Take 17 g by mouth 2  (two) times daily. Until daily soft stools  OTC 03/05/15  Yes Nona Dell, PA-C  promethazine (PHENERGAN) 25 MG tablet Take 25 mg by mouth every 6 (six) hours as needed for nausea or vomiting.   Yes [provider]  SPIRIVA HANDIHALER 18 MCG inhalation capsule Place 18 mcg into inhaler and inhale daily.  04/11/19  Yes [provider]  valACYclovir (VALTREX) 500 MG tablet Take 500 mg by mouth daily.   Yes [provider]    Current Facility-Administered Medications  Medication Dose Route Frequency Provider Last Rate Last Dose  . amitriptyline (ELAVIL) tablet 100 mg  100 mg Oral Loma Sousa, MD   100 mg at 05/07/19 0015  . atorvastatin (LIPITOR) tablet 20 mg  20 mg Oral Daily Jani Gravel, MD   20 mg at 05/07/19 0817  . busPIRone (BUSPAR) tablet 10 mg  10 mg Oral TID Jani Gravel, MD   10 mg at 05/07/19 0801  . citalopram (CELEXA) tablet 20 mg  20 mg Oral Daily Jani Gravel, MD   20 mg at 05/07/19 0801  .  cyanocobalamin ((VITAMIN B-12)) injection 1,000 mcg  1,000 mcg Intramuscular Once Manuella Ghazi, Pratik D, DO      . docusate sodium (COLACE) capsule 100 mg  100 mg Oral QHS Jani Gravel, MD   100 mg at 05/07/19 0015  . feeding supplement (ENSURE ENLIVE) (ENSURE ENLIVE) liquid 237 mL  237 mL Oral BID BM Jani Gravel, MD   237 mL at 05/07/19 0817  . ferrous sulfate tablet 325 mg  325 mg Oral Daily Jani Gravel, MD   325 mg at 05/07/19 0816  . ferumoxytol (FERAHEME) 510 mg in sodium chloride 0.9 % 100 mL IVPB  510 mg Intravenous Once Manuella Ghazi, Pratik D, DO      . furosemide (LASIX) tablet 20 mg  20 mg Oral Daily Jani Gravel, MD   20 mg at 05/07/19 0818  . insulin aspart (novoLOG) injection 0-5 Units  0-5 Units Subcutaneous QHS Jani Gravel, MD   2 Units at 05/06/19 2354  . insulin aspart (novoLOG) injection 0-9 Units  0-9 Units Subcutaneous TID WC Jani Gravel, MD   2 Units at 05/07/19 0750  . levofloxacin (LEVAQUIN) IVPB 750 mg  750 mg Intravenous Q24H Francine Graven, DO 100 mL/hr at  05/06/19 1956 750 mg at 05/06/19 1956  . levothyroxine (SYNTHROID) tablet 25 mcg  25 mcg Oral QAC breakfast Jani Gravel, MD   25 mcg at 05/07/19 0600  . lipase/protease/amylase (CREON) capsule 48,000 Units  48,000 Units Oral TID WC Manuella Ghazi, Pratik D, DO   48,000 Units at 05/07/19 0830  . lipase/protease/amylase (CREON) capsule 48,000 Units  48,000 Units Oral BID PRN Manuella Ghazi, Pratik D, DO      . lisinopril (ZESTRIL) tablet 2.5 mg  2.5 mg Oral Daily Jani Gravel, MD   2.5 mg at 05/07/19 0802  . LORazepam (ATIVAN) tablet 0.5 mg  0.5 mg Oral Q8H PRN Jani Gravel, MD   0.5 mg at 05/07/19 1152  . metFORMIN (GLUCOPHAGE) tablet 1,000 mg  1,000 mg Oral BID WC Jani Gravel, MD   1,000 mg at 05/07/19 0751  . methocarbamol (ROBAXIN) tablet 500 mg  500 mg Oral TID Jani Gravel, MD   500 mg at 05/07/19 0802  . methylPREDNISolone sodium succinate (SOLU-MEDROL) 40 mg/mL injection 40 mg  40 mg Intravenous Q12H Shah, Pratik D, DO      . mometasone-formoterol (DULERA) 100-5 MCG/ACT inhaler 2 puff  2 puff Inhalation BID Jani Gravel, MD      . oxyCODONE (Oxy IR/ROXICODONE) immediate release tablet 15 mg  15 mg Oral Q6H PRN Jani Gravel, MD   15 mg at 05/07/19 0752  . pantoprazole (PROTONIX) injection 40 mg  40 mg Intravenous Q12H Shah, Pratik D, DO      . polyethylene glycol (MIRALAX / GLYCOLAX) packet 17 g  17 g Oral BID Manuella Ghazi, Pratik D, DO   17 g at 05/07/19 0824  . promethazine (PHENERGAN) tablet 25 mg  25 mg Oral Q6H PRN Jani Gravel, MD      . valACYclovir Estell Harpin) tablet 500 mg  500 mg Oral Daily Jani Gravel, MD   500 mg at 05/07/19 [id number]    Allergies as of 05/06/2019 - Review Complete 05/06/2019  Allergen Reaction Noted  . Penicillins Shortness Of Breath   . Sulfa antibiotics Shortness Of Breath 04/21/2011  . Aspirin Nausea And Vomiting 03/05/2015  . Lovenox  [enoxaparin sodium] Other (See Comments) 03/05/2015  . Prednisone Other (See Comments) 07/14/2011    Family History  Problem Relation Age of Onset  . Asthma Other   .  Asthma Paternal Grandfather   . Heart attack Paternal Grandfather   . Hypertension Mother   . Cancer Maternal Grandmother   . Diabetes Paternal Grandmother   . Coronary artery disease Neg Hx   . Colon cancer Neg Hx     Social History   Socioeconomic History  . Marital status: Divorced    Spouse name: Not on file  . Number of children: Not on file  . Years of education: Not on file  . Highest education level: Some college, no degree  Occupational History  . Occupation: disabled  Social Needs  . Financial resource strain: Not hard at all  . Food insecurity    Worry: Not on file    Inability: Not on file  . Transportation needs    Medical: No    Non-medical: No  Tobacco Use  . Smoking status: Former Research scientist (life sciences)  . Smokeless tobacco: Never Used  Substance and Sexual Activity  . Alcohol use: No  . Drug use: No  . Sexual activity: Yes    Birth control/protection: Surgical  Lifestyle  . Physical activity    Days per week: Not on file    Minutes per session: Not on file  . Stress: Not on file  Relationships  . Social Herbalist on phone: Not on file    Gets together: Not on file    Attends religious service: Not on file    Active member of club or organization: Not on file    Attends meetings of clubs or organizations: Not on file    Relationship status: Not on file  . Intimate partner violence    Fear of current or ex partner: Not on file    Emotionally abused: Not on file    Physically abused: Not on file    Forced sexual activity: Not on file  Other Topics Concern  . Not on file  Social History Narrative   ** Merged History Encounter **       She is on disability for pancreatitis since 2014.  She was previously working as a Presenter, broadcasting in 2004. She lives with her husband in a two level home.     Review of Systems:  As in history of present illness.   Physical Exam: Vital signs in last 24 hours: Temp:  [98 F (36.7 C)-100.1 F (37.8 C)] 98.3 F  (36.8 C) (10/11 1145) Pulse Rate:  [102-125] 109 (10/11 1145) Resp:  [12-32] 18 (10/11 1145) BP: (105-127)/(49-80) 105/55 (10/11 1145) SpO2:  [88 %-99 %] 97 % (10/11 1145) FiO2 (%):  [0 %] 0 % (10/10 1942) Weight:  [55.3 kg] 55.3 kg (10/10 1706) Last BM Date: 05/05/19 General:   Alert, mildly short of breath.  He is speaking in complete sentences.    Eyes:  Sclera clear, no icterus.   Conjunctiva pale. Heart:  Regular rate and rhythm; no murmurs, clicks, rubs,  or gallops. Abdomen:  Soft, nontender and nondistended. No masses, hepatosplenomegaly or hernias noted. Normal bowel sounds, without guarding, and without rebound.     Intake/Output from previous day: 10/10 0701 - 10/11 0700 In: 1533.8 [P.O.:720; I.V.:671; IV Piggyback:142.9] Out: -  Intake/Output this shift: Total I/O In: 685 [P.O.:360; I.V.:10; Blood:315] Out: -   Lab Results: Recent Labs    05/06/19 1746 05/07/19 0624  WBC 19.8* 10.4  HGB 8.5* 6.6*  HCT 30.0* 22.5*  PLT 420* 319   BMET Recent Labs    05/06/19 1746 05/07/19 0624  NA 139  140  K 4.1 3.9  CL 101 104  CO2 24 24  GLUCOSE 95 199*  BUN 12 11  CREATININE 0.74 0.63  CALCIUM 9.5 8.4*   LFT Recent Labs    05/07/19 0624  PROT 6.1*  ALBUMIN 2.8*  AST 15  ALT 12  ALKPHOS 82  BILITOT 0.2*   PT/INR No results for input(s): LABPROT, INR in the last 72 hours. Hepatitis Panel No results for input(s): HEPBSAG, HCVAB, HEPAIGM, HEPBIGM in the last 72 hours. C-Diff No results for input(s): CDIFFTOX in the last 72 hours.  Studies/Results: Dg Chest Port 1 View  Result Date: 05/06/2019 CLINICAL DATA:  Pt states she has asthma and has been battling pneumonia since a hospital admission in July. Pt seen PCP at [facility] Thursday has been on multiple rounds of antibiotics. Follow-up with PCP scheduled next week. EXAM: PORTABLE CHEST 1 VIEW COMPARISON:  Chest radiograph 02/03/2019, 04/28/2018 FINDINGS: The heart size and mediastinal contours are within  normal limits. There are diffuse bilateral interstitial opacities. There are small focal opacities in the right base and left mid lung. No pneumothorax or large pleural effusion. No acute finding in the visualized skeleton. IMPRESSION: 1. Diffuse bilateral interstitial opacities could represent reactive airways disease, atypical infection, or edema. 2. Small focal opacities in the right base and left mid lung may reflect superimposed atelectasis versus infection. Electronically Signed   By: Audie Pinto M.D.   On: 05/06/2019 18:26    Impression: 48 year old lady with COPD/asthma, history of iron deficiency anemia, chronic pancreatitis admitted with community-acquired pneumonia.  Progressive decline in her hemoglobin.  Possible melena recently. She has an  impressive iron deficiency anemia. Hemodynamically stable without overt bleeding at this time.  Prior GI work-up as outlined above.  [facility] records are not available at this time.  Further evaluation is warranted.  Recommendations: Agree with IV PPI therapy empirically /continue pancreatic enzymes..  Agree with transfusion.  Once her pulmonary status has improved, I would offer this nice lady a diagnostic EGD (likely in 1 to 2 days) prior to discharge.  I would also recommend updating her colonoscopy as an outpatient.  Will attempt to retrieve more information about her prior GI work-up at [facility].           Notice:  This dictation was prepared with Dragon dictation along with smaller phrase technology. Any transcriptional errors that result from this process are unintentional and may not be corrected upon review.

## 2019-05-07 NOTE — Progress Notes (Signed)
*  Critical lab  Hgb 6.6 Physician notified. Elodia Florence RN  05/07/2019 @0730

## 2019-05-08 DIAGNOSIS — K921 Melena: Secondary | ICD-10-CM

## 2019-05-08 LAB — TYPE AND SCREEN
ABO/RH(D): A POS
Antibody Screen: NEGATIVE
Unit division: 0

## 2019-05-08 LAB — GLUCOSE, CAPILLARY
Glucose-Capillary: 156 mg/dL — ABNORMAL HIGH (ref 70–99)
Glucose-Capillary: 138 mg/dL — ABNORMAL HIGH (ref 70–99)
Glucose-Capillary: 156 mg/dL — ABNORMAL HIGH (ref 70–99)
Glucose-Capillary: 173 mg/dL — ABNORMAL HIGH (ref 70–99)

## 2019-05-08 LAB — LEGIONELLA PNEUMOPHILA SEROGP 1 UR AG: L. pneumophila Serogp 1 Ur Ag: NEGATIVE

## 2019-05-08 LAB — BASIC METABOLIC PANEL
Anion gap: 12 (ref 5–15)
BUN: 17 mg/dL (ref 6–20)
CO2: 24 mmol/L (ref 22–32)
Calcium: 9 mg/dL (ref 8.9–10.3)
Chloride: 104 mmol/L (ref 98–111)
Creatinine, Ser: 0.76 mg/dL (ref 0.44–1.00)
GFR calc Af Amer: >60 mL/min (ref >60)
GFR calc non Af Amer: >60 mL/min (ref >60)
Glucose, Bld: 148 mg/dL — ABNORMAL HIGH (ref 70–99)
Potassium: 3.9 mmol/L (ref 3.5–5.1)
Sodium: 140 mmol/L (ref 135–145)

## 2019-05-08 LAB — CBC
HCT: 27.5 % — ABNORMAL LOW (ref 36.0–46.0)
Hemoglobin: 8.2 g/dL — ABNORMAL LOW (ref 12.0–15.0)
MCH: 24.1 pg — ABNORMAL LOW (ref 26.0–34.0)
MCHC: 29.8 g/dL — ABNORMAL LOW (ref 30.0–36.0)
MCV: 80.9 fL (ref 80.0–100.0)
Platelets: 338 10*3/uL (ref 150–400)
RBC: 3.4 MIL/uL — ABNORMAL LOW (ref 3.87–5.11)
RDW: 17.6 % — ABNORMAL HIGH (ref 11.5–15.5)
WBC: 12.3 10*3/uL — ABNORMAL HIGH (ref 4.0–10.5)
nRBC: 0 % (ref 0.0–0.2)

## 2019-05-08 LAB — BPAM RBC
Blood Product Expiration Date: 202010292359
ISSUE DATE / TIME: 202010111112
Unit Type and Rh: 6200

## 2019-05-08 LAB — FOLATE RBC
Folate, Hemolysate: 306 ng/mL
Folate, RBC: 1450 ng/mL (ref >498)
Hematocrit: 21.1 % — ABNORMAL LOW (ref 34.0–46.6)

## 2019-05-08 LAB — LACTIC ACID, PLASMA: Lactic Acid, Venous: 0.8 mmol/L (ref 0.5–1.9)

## 2019-05-08 MED ORDER — SODIUM CHLORIDE 0.9 % IV SOLN
510.0000 mg | Freq: Once | INTRAVENOUS | Status: AC
  Administered 2019-05-08: 510 mg via INTRAVENOUS
  Filled 2019-05-08: qty 510

## 2019-05-08 MED ORDER — OXYCODONE HCL 5 MG PO TABS
30.0000 mg | ORAL_TABLET | Freq: Once | ORAL | Status: AC
  Administered 2019-05-08: 30 mg via ORAL
  Filled 2019-05-08: qty 6

## 2019-05-08 NOTE — Care Management Important Message (Signed)
Important Message  Patient Details  Name: [name] MRN: [id number] Date of Birth: August 20, 1970   Medicare Important Message Given:  Yes     Tommy Medal 05/08/2019, 2:54 PM

## 2019-05-08 NOTE — Progress Notes (Addendum)
Initial Nutrition Assessment  DOCUMENTATION CODES:      INTERVENTION:  Ensure Enlive po BID, each supplement provides 350 kcal and 20 grams of protein    NUTRITION DIAGNOSIS:   Increased nutrient needs related to acute illness(CAP and early sepsis) as evidenced by estimated needs and (acute diagnosis).   GOAL:   Patient will meet greater than or equal to 90% of their needs    MONITOR:   PO intake, Supplement acceptance, Labs, Weight trends, Skin  REASON FOR ASSESSMENT:   Malnutrition Screening Tool    ASSESSMENT: Patient is a 48 yo female from home with history of COPD/Asthma, Chronic pancreatitis, DM, Anemia, Hiatal hernia and depression. Community acquired pneumonia/ early sepsis, dark stools. Plans for EGD/EUS in the morning 10/13.  Meal intake: 75-100% meals documented. Observed lunch tray which was also consistent with meal percentage above. Feeds herself. Patient says she doesn't eat meat as much as she used to. Educated her related to plant protein sources.  Weight history has improved since July. Patient reported wt loss at that time but has re-gained to usual 120-122 lb range.   Medications reviewed and include: PPI, Pancreatic enzymes, colace, miralax, lasix, SSI, prednisone, Nicoderm patch.  Labs reviewed: Glucose 148, WBC-12.3, Hgb 8.2 (L)  NUTRITION - FOCUSED PHYSICAL EXAM:  Nutrition-Focused physical exam findings are no fat depletion, no muscle depletion, and moderate BLE edema.     Diet Order:   Diet Order            Diet NPO time specified Except for: Sips with Meds  Diet effective midnight        Diet heart healthy/carb modified Room service appropriate? Yes; Fluid consistency: Thin  Diet effective now              EDUCATION NEEDS:   Education needs have been addressed Skin:  Skin Assessment: Reviewed RN Assessment  Last BM:  10/10  Height:   Ht Readings from Last 1 Encounters:  05/06/19 5\' 2"  (1.575 m)    Weight:   Wt Readings  from Last 1 Encounters:  05/06/19 55.3 kg    Ideal Body Weight:  50 kg  BMI:  Body mass index is 22.31 kg/m.  Estimated Nutritional Needs:   Kcal:  1540-1650 (28-30 kcal/kg/bw)  Protein:  70-75 gr (~1.3 gr/kg/bw)  Fluid:  1.5-1.7 liters daily    Colman Cater MS,RD,CSG,LDN Office: [phone] Pager: [phone]

## 2019-05-08 NOTE — Evaluation (Signed)
Physical Therapy Evaluation Patient Details Name: [name] MRN: [id number] DOB: [birth year] Today's Date: 05/08/2019   History of Present Illness  [name]  is a 48 y.o. female, w Hypertension, Hyperlipidemia, Dm2, w neuropathy, Gerd, Asthma/ Copd presents w dyspnea for the past 4 weeks worse today, along with dry cough, and subjective fever.  Pt denies cp, palp, n/v, abd pain, diarrhea, brbpr, dysuria, hematuria.    Clinical Impression  Patient functioning near baseline for functional mobility and gait.  Patient demonstrates good return for transferring to chair and ambulation in room and hallway without loss of balance and tolerated sitting up in chair to eat breakfast after therapy.  Patient on room air with SpO2 between 90-93% during ambulation in hallway and room and encouraged to ambulate with nursing staff daily as tolerated for LOS.  Plan:  Patient discharged from physical therapy to care of nursing for ambulation daily as tolerated for length of stay.     Follow Up Recommendations No PT follow up;Supervision - Intermittent    Equipment Recommendations  None recommended by PT    Recommendations for Other Services       Precautions / Restrictions Precautions Precautions: None Restrictions Weight Bearing Restrictions: No      Mobility  Bed Mobility Overal bed mobility: Independent                Transfers Overall transfer level: Independent                  Ambulation/Gait Ambulation/Gait assistance: Modified independent (Device/Increase time) Gait Distance (Feet): 75 Feet Assistive device: None Gait Pattern/deviations: WFL(Within Functional Limits) Gait velocity: decreased   General Gait Details: slightly labored cadence without loss of balance, on room air with SpO2 between 90-92%, limited secondary to c/o fatigue  Stairs            Wheelchair Mobility    Modified Rankin (Stroke Patients Only)       Balance Overall  balance assessment: No apparent balance deficits (not formally assessed)                                           Pertinent Vitals/Pain Pain Assessment: No/denies pain    Home Living Family/patient expects to be discharged to:: Private residence Living Arrangements: Spouse/significant other Available Help at Discharge: Family;Available 24 hours/day Type of Home: House Home Access: Stairs to enter Entrance Stairs-Rails: None Entrance Stairs-Number of Steps: 3 Home Layout: Two level;Able to live on main level with bedroom/bathroom Home Equipment: Kasandra Knudsen - single point;Walker - 2 wheels      Prior Function Level of Independence: Independent         Comments: household and short distanced community ambulator, on Home O2 PRN, usually 3 LPM     Hand Dominance        Extremity/Trunk Assessment   Upper Extremity Assessment Upper Extremity Assessment: Overall WFL for tasks assessed    Lower Extremity Assessment Lower Extremity Assessment: Overall WFL for tasks assessed    Cervical / Trunk Assessment Cervical / Trunk Assessment: Normal  Communication   Communication: No difficulties  Cognition Arousal/Alertness: Awake/alert Behavior During Therapy: WFL for tasks assessed/performed Overall Cognitive Status: Within Functional Limits for tasks assessed  General Comments      Exercises     Assessment/Plan    PT Assessment Patent does not need any further PT services  PT Problem List         PT Treatment Interventions      PT Goals (Current goals can be found in the Care Plan section)  Acute Rehab PT Goals Patient Stated Goal: return home with family to assist PT Goal Formulation: With patient Time For Goal Achievement: 05/08/19 Potential to Achieve Goals: Good    Frequency     Barriers to discharge        Co-evaluation               AM-PAC PT "6 Clicks" Mobility  Outcome  Measure Help needed turning from your back to your side while in a flat bed without using bedrails?: None Help needed moving from lying on your back to sitting on the side of a flat bed without using bedrails?: None Help needed moving to and from a bed to a chair (including a wheelchair)?: None Help needed standing up from a chair using your arms (e.g., wheelchair or bedside chair)?: None Help needed to walk in hospital room?: None Help needed climbing 3-5 steps with a railing? : A Little 6 Click Score: 23    End of Session   Activity Tolerance: Patient tolerated treatment well;Patient limited by fatigue Patient left: in chair;with call bell/phone within reach Nurse Communication: Mobility status PT Visit Diagnosis: Unsteadiness on feet (R26.81);Other abnormalities of gait and mobility (R26.89);Muscle weakness (generalized) (M62.81)    Time: [id number] PT Time Calculation (min) (ACUTE ONLY): 15 min   Charges:   PT Evaluation $PT Eval Low Complexity: 1 Low PT Treatments $Therapeutic Activity: 8-22 mins        9:09 AM, 05/08/19 Lonell Grandchild, MPT Physical Therapist with [facility] [phone] office [phone] mobile phone

## 2019-05-08 NOTE — Progress Notes (Addendum)
Subjective: States she tastes blood when coughing. No melena since inpatient. Feels improved respiratory-wise since admission. Still getting winded when walking. Doesn't feel back to baseline. Has chronic headaches and takes BC powders 4-5 times per week. Was told she had an ulcer at first part of small intestine at time of EGD/EUS June 2020 by Dr. Newman Pies. Unable to see procedure notes.   Objective: Vital signs in last 24 hours: Temp:  [98 F (36.7 C)-98.7 F (37.1 C)] 98.5 F (36.9 C) (10/12 0800) Pulse Rate:  [94-119] 94 (10/12 0800) Resp:  [16-20] 16 (10/12 0800) BP: (98-135)/(55-79) 121/71 (10/12 0800) SpO2:  [95 %-100 %] 96 % (10/12 0800) Last BM Date: 05/06/19 General:   Alert and oriented, pleasant Head:  Normocephalic and atraumatic.  Abdomen:  Bowel sounds present, soft, non-tender, non-distended. Extremities:  Without edema. Neurologic:  Alert and  oriented x4 Psych: Normal mood and affect.  Intake/Output from previous day: 10/11 0701 - 10/12 0700 In: 2455 [P.O.:1560; I.V.:10; Blood:735; IV Piggyback:150] Out: -  Intake/Output this shift: Total I/O In: 240 [P.O.:240] Out: -   Lab Results: Recent Labs    05/06/19 1746 05/07/19 0624 05/07/19 1758 05/08/19 0627  WBC 19.8* 10.4  --  12.3*  HGB 8.5* 6.6* 8.5* 8.2*  HCT 30.0* 22.5* 29.1* 27.5*  PLT 420* 319  --  338   BMET Recent Labs    05/06/19 1746 05/07/19 0624 05/08/19 0627  NA 139 140 140  K 4.1 3.9 3.9  CL 101 104 104  CO2 24 24 24   GLUCOSE 95 199* 148*  BUN 12 11 17   CREATININE 0.74 0.63 0.76  CALCIUM 9.5 8.4* 9.0   LFT Recent Labs    05/07/19 0624  PROT 6.1*  ALBUMIN 2.8*  AST 15  ALT 12  ALKPHOS 82  BILITOT 0.2*     Studies/Results: Dg Chest Port 1 View  Result Date: 05/06/2019 CLINICAL DATA:  Pt states she has asthma and has been battling pneumonia since a hospital admission in July. Pt seen PCP at [facility] Thursday has been on multiple rounds of antibiotics. Follow-up with  PCP scheduled next week. EXAM: PORTABLE CHEST 1 VIEW COMPARISON:  Chest radiograph 02/03/2019, 04/28/2018 FINDINGS: The heart size and mediastinal contours are within normal limits. There are diffuse bilateral interstitial opacities. There are small focal opacities in the right base and left mid lung. No pneumothorax or large pleural effusion. No acute finding in the visualized skeleton. IMPRESSION: 1. Diffuse bilateral interstitial opacities could represent reactive airways disease, atypical infection, or edema. 2. Small focal opacities in the right base and left mid lung may reflect superimposed atelectasis versus infection. Electronically Signed   By: Audie Pinto M.D.   On: 05/06/2019 18:26    Assessment: 48 year old female admitted with sepsis due to CAP, acute COPD exacerbation, acute blood loss anemia with history of IDA, possible melena. Hgb improved from 6.6 on admission to 8.2, s/p 1 unit PRBCs. Notable IDA with ferritin 6 on admission. No overt GI bleeding. Interestingly, she notes that at time of EGD/EUS in June 2020 by Dr. Newman Pies at [facility], she was told she had what sounds to duodenal ulcer. NO notes available in Care Everywhere, but pathology available states mild chronic gastritis, negative H.pylori. She does endorse frequent aspirin powder use due to headaches.   History of chronic pancreatitis: followed at [facility]. EGD/EUS June 2020 with celiac plexus block, with improvement in pain this time. Results not available via Care Everywhere.   IDA: colonoscopy  in 2014 normal. EGD at time of EUS in June 2020, now with reported melena and acute on chronic anemia. Capsule study in 2014 with erosions.   Clinically improving slowly and not back to respiratory baseline. Will make NPO after midnight except for meds and reassess in the morning for possible EGD with Propofol 10/13.    Plan: Continue IV PPI BID Avoid NSAIDs Continue pancreatic enzymes NPO after midnight except sips with  meds Reassess in morning for possible EGD with Propofol Attempt to retrieve reports from [facility] (June 2020) Colonoscopy as outpatient Would hold iron in acute inpatient setting so as not to cloud clinical picture   Annitta Needs, PhD, ANP-BC [facility]    LOS: 2 days    05/08/2019, 9:39 AM

## 2019-05-08 NOTE — Progress Notes (Signed)
PROGRESS NOTE    [name]  [id number] DOB: 1970-09-29 DOA: 05/06/2019 PCP: Redmond School, MD   Brief Narrative:  Per HPI: DeloresRiersonis a48 y.o.female,w Hypertension, Hyperlipidemia, Dm2, w neuropathy, Gerd, Asthma/ Copd presents w dyspnea for the past 4 weeks worse today, along with dry cough, and subjective fever. Pt denies cp, palp, n/v, abd pain, diarrhea, brbpr, dysuria, hematuria.   Patient was admitted for community-acquired pneumonia with early sepsis as well as COPD/asthma exacerbation.  10/11: Patient appears to be doing well this morning and states that she is much less short of breath and nearing her baseline, unfortunately she has had decreasing hemoglobin levels and states that she has been more lightheaded recently with darker stools.  She states that she gets chronic pain injections for chronic pancreatitis and has been noted to have upper GI ulcerations previously.  10/12: Patient appears to be doing well from a respiratory standpoint this morning and has had no further bowel movements or melena noted.  Hemoglobin remained stable after transfusion with improvement 8.2.  GI planning for EGD prior to discharge hopefully in a.m.  Assessment & Plan:   Principal Problem:   Asthma exacerbation Active Problems:   Essential hypertension   GERD   CAP (community acquired pneumonia)   DM type 2 (diabetes mellitus, type 2) ([place])   History of pancreatitis   Sepsis ([place])   Melena   Sepsis secondary to community-acquired pneumonia -Blood cultures with no growth in 2 days -Urine strep pneumonia negative and Legionella pending -Urine culture with staph epidermidis, likely contaminant -Continue Levaquin as currently prescribed for total 5 days -Monitor CBC in a.m. with lactic acid today 0.8  Acute COPD exacerbation secondary to above -Patient does have chronic hypoxemia and remains on 3 L which she is on currently -Wean Solu-Medrol to 40 mg IV twice  daily -Continue Symbicort -Albuterol every 6 hours as needed for shortness of breath or wheezing  Acute blood loss anemia-improved status post 1 unit PRBC transfusion 10/11 -PPI IV twice daily -GI consultation for endoscopy with likely EGD planned for a.m. -Has history of iron deficiency with anemia panel demonstrating severe iron deficiency for which I will order Feraheme this will be given today -B12 deficiency for which I will administer subcutaneous B12  Hypertension-controlled -Continue lisinopril and hold amlodipine which she does not take  Dyslipidemia -Continue Lipitor  Type 2 diabetes with mild steroid-induced hyperglycemia -Continue metformin as well as sliding scale insulin -Monitor carefully  Hypothyroidism -Continue levothyroxine  Anxiety/depression -Continue current meds  Pancreatic insufficiency with history of chronic pancreatitis -Continue pancreatic enzymes while hospitalized  Chronic back pain/constipation -Continue oxycodone and Robaxin -Continue Colace and MiraLAX  DVT prophylaxis: SCDs Code Status: Full Family Communication: None at bedside Disposition Plan:  Appreciate GI evaluation for EGD hopefully in a.m.  Continue current antibiotics and monitor H&H.  Continue steroids for now and switch to oral prednisone on discharge.   Consultants:   GI  Procedures:   None  Antimicrobials:  Anti-infectives (From admission, onward)   Start     Dose/Rate Route Frequency Ordered Stop   05/07/19 1000  valACYclovir (VALTREX) tablet 500 mg     500 mg Oral Daily 05/06/19 2351     05/06/19 1930  levofloxacin (LEVAQUIN) IVPB 750 mg     750 mg 100 mL/hr over 90 Minutes Intravenous Every 24 hours 05/06/19 1926 05/11/19 1929       Subjective: Patient seen and evaluated today with no further bowel movements noted.  Her breathing  has improved.  She remains on nasal cannula which she wears at home.  Globin has improved after  transfusion.  Objective: Vitals:   05/07/19 2126 05/08/19 0529 05/08/19 0800 05/08/19 1016  BP: 129/73 135/79 121/71   Pulse: (!) 119 97 94   Resp: 18 16 16    Temp: 98.7 F (37.1 C) 98.3 F (36.8 C) 98.5 F (36.9 C)   TempSrc: Oral  Oral   SpO2: 96% 95% 96% 95%  Weight:      Height:        Intake/Output Summary (Last 24 hours) at 05/08/2019 1134 Last data filed at 05/08/2019 0900 Gross per 24 hour  Intake 2010 ml  Output -  Net 2010 ml   Filed Weights   05/06/19 1706  Weight: 55.3 kg    Examination:  General exam: Appears calm and comfortable  Respiratory system: Clear to auscultation. Respiratory effort normal.  Currently on nasal cannula oxygen. Cardiovascular system: S1 & S2 heard, RRR. No JVD, murmurs, rubs, gallops or clicks. No pedal edema. Gastrointestinal system: Abdomen is nondistended, soft and nontender. No organomegaly or masses felt. Normal bowel sounds heard. Central nervous system: Alert and oriented. No focal neurological deficits. Extremities: Symmetric 5 x 5 power. Skin: No rashes, lesions or ulcers Psychiatry: Judgement and insight appear normal. Mood & affect appropriate.     Data Reviewed: I have personally reviewed following labs and imaging studies  CBC: Recent Labs  Lab 05/06/19 1746 05/07/19 0624 05/07/19 1758 05/08/19 0627  WBC 19.8* 10.4  --  12.3*  NEUTROABS 15.7*  --   --   --   HGB 8.5* 6.6* 8.5* 8.2*  HCT 30.0* 22.5* 29.1* 27.5*  MCV 82.0 79.8*  --  80.9  PLT 420* 319  --  [id number]   Basic Metabolic Panel: Recent Labs  Lab 05/06/19 1746 05/07/19 0624 05/08/19 0627  NA 139 140 140  K 4.1 3.9 3.9  CL 101 104 104  CO2 24 24 24   GLUCOSE 95 199* 148*  BUN 12 11 17   CREATININE 0.74 0.63 0.76  CALCIUM 9.5 8.4* 9.0   GFR: Estimated Creatinine Clearance: 66.5 mL/min (by C-G formula based on SCr of 0.76 mg/dL). Liver Function Tests: Recent Labs  Lab 05/07/19 0624  AST 15  ALT 12  ALKPHOS 82  BILITOT 0.2*  PROT 6.1*   ALBUMIN 2.8*   No results for input(s): LIPASE, AMYLASE in the last 168 hours. No results for input(s): AMMONIA in the last 168 hours. Coagulation Profile: No results for input(s): INR, PROTIME in the last 168 hours. Cardiac Enzymes: No results for input(s): CKTOTAL, CKMB, CKMBINDEX, TROPONINI in the last 168 hours. BNP (last 3 results) No results for input(s): PROBNP in the last 8760 hours. HbA1C: No results for input(s): HGBA1C in the last 72 hours. CBG: Recent Labs  Lab 05/07/19 1105 05/07/19 1559 05/07/19 2025 05/08/19 0718 05/08/19 1118  GLUCAP 216* 106* 231* 156* 138*   Lipid Profile: No results for input(s): CHOL, HDL, LDLCALC, TRIG, CHOLHDL, LDLDIRECT in the last 72 hours. Thyroid Function Tests: No results for input(s): TSH, T4TOTAL, FREET4, T3FREE, THYROIDAB in the last 72 hours. Anemia Panel: Recent Labs    05/07/19 0624 05/07/19 0828  VITAMINB12 142*  --   FOLATE  --  32.5  FERRITIN 6*  --   TIBC 378  --   IRON 16*  --   RETICCTPCT  --  1.2   Sepsis Labs: Recent Labs  Lab 05/06/19 1746 05/06/19 1930 05/08/19 [id number]  LATICACIDVEN 2.8* 2.6* 0.8    Recent Results (from the past 240 hour(s))  Culture, blood (routine x 2) Call MD if unable to obtain prior to antibiotics being given     Status: None (Preliminary result)   Collection Time: 05/06/19  5:44 PM   Specimen: BLOOD RIGHT ARM  Result Value Ref Range Status   Specimen Description BLOOD RIGHT ARM  Final   Special Requests   Final    BOTTLES DRAWN AEROBIC AND ANAEROBIC Blood Culture adequate volume   Culture   Final    NO GROWTH 2 DAYS Performed at [facility], [address], [place], [place] [address]    Report Status PENDING  Incomplete  SARS Coronavirus 2 by RT PCR (hospital order, performed in [facility] hospital lab) Nasopharyngeal Nasopharyngeal Swab     Status: None   Collection Time: 05/06/19  5:46 PM   Specimen: Nasopharyngeal Swab  Result Value Ref Range Status   SARS Coronavirus  2 NEGATIVE NEGATIVE Final    Comment: (NOTE) If result is NEGATIVE SARS-CoV-2 target nucleic acids are NOT DETECTED. The SARS-CoV-2 RNA is generally detectable in upper and lower  respiratory specimens during the acute phase of infection. The lowest  concentration of SARS-CoV-2 viral copies this assay can detect is 250  copies / mL. A negative result does not preclude SARS-CoV-2 infection  and should not be used as the sole basis for treatment or other  patient management decisions.  A negative result may occur with  improper specimen collection / handling, submission of specimen other  than nasopharyngeal swab, presence of viral mutation(s) within the  areas targeted by this assay, and inadequate number of viral copies  (<250 copies / mL). A negative result must be combined with clinical  observations, patient history, and epidemiological information. If result is POSITIVE SARS-CoV-2 target nucleic acids are DETECTED. The SARS-CoV-2 RNA is generally detectable in upper and lower  respiratory specimens dur ing the acute phase of infection.  Positive  results are indicative of active infection with SARS-CoV-2.  Clinical  correlation with patient history and other diagnostic information is  necessary to determine patient infection status.  Positive results do  not rule out bacterial infection or co-infection with other viruses. If result is PRESUMPTIVE POSTIVE SARS-CoV-2 nucleic acids MAY BE PRESENT.   A presumptive positive result was obtained on the submitted specimen  and confirmed on repeat testing.  While 2019 novel coronavirus  (SARS-CoV-2) nucleic acids may be present in the submitted sample  additional confirmatory testing may be necessary for epidemiological  and / or clinical management purposes  to differentiate between  SARS-CoV-2 and other Sarbecovirus currently known to infect humans.  If clinically indicated additional testing with an alternate test  methodology  [phone]) is advised. The SARS-CoV-2 RNA is generally  detectable in upper and lower respiratory sp ecimens during the acute  phase of infection. The expected result is Negative. Fact Sheet for Patients:  StrictlyIdeas.no Fact Sheet for Healthcare Providers: BankingDealers.co.za This test is not yet approved or cleared by the [place] FDA and has been authorized for detection and/or diagnosis of SARS-CoV-2 by FDA under an Emergency Use Authorization (EUA).  This EUA will remain in effect (meaning this test can be used) for the duration of the COVID-19 declaration under Section 564(b)(1) of the Act, 21 U.S.C. section 360bbb-3(b)(1), unless the authorization is terminated or revoked sooner. Performed at [facility], [address], [place], [place] [address]   Urine culture  Status: Abnormal (Preliminary result)   Collection Time: 05/06/19  5:46 PM   Specimen: Urine, Random  Result Value Ref Range Status   Specimen Description   Final    URINE, RANDOM Performed at [facility], [address] [place], [place] [address]    Special Requests   Final    NONE Performed at [facility], [address], [place], [place] [address]    Culture (A)  Final    >=100,000 COLONIES/mL STAPHYLOCOCCUS EPIDERMIDIS SUSCEPTIBILITIES TO FOLLOW Performed at [facility], [place] [address], [place], [place] [address]    Report Status PENDING  Incomplete  Culture, blood (routine x 2) Call MD if unable to obtain prior to antibiotics being given     Status: None (Preliminary result)   Collection Time: 05/06/19  7:30 PM   Specimen: Left Antecubital; Blood  Result Value Ref Range Status   Specimen Description LEFT ANTECUBITAL  Final   Special Requests   Final    BOTTLES DRAWN AEROBIC ONLY Blood Culture results may not be optimal due to an inadequate volume of blood received in culture bottles   Culture   Final    NO GROWTH 2 DAYS Performed at [facility], [address] [place],  [address]    Report Status PENDING  Incomplete         Radiology Studies: Dg Chest Port 1 View  Result Date: 05/06/2019 CLINICAL DATA:  Pt states she has asthma and has been battling pneumonia since a hospital admission in July. Pt seen PCP at [facility] Thursday has been on multiple rounds of antibiotics. Follow-up with PCP scheduled next week. EXAM: PORTABLE CHEST 1 VIEW COMPARISON:  Chest radiograph 02/03/2019, 04/28/2018 FINDINGS: The heart size and mediastinal contours are within normal limits. There are diffuse bilateral interstitial opacities. There are small focal opacities in the right base and left mid lung. No pneumothorax or large pleural effusion. No acute finding in the visualized skeleton. IMPRESSION: 1. Diffuse bilateral interstitial opacities could represent reactive airways disease, atypical infection, or edema. 2. Small focal opacities in the right base and left mid lung may reflect superimposed atelectasis versus infection. Electronically Signed   By: Audie Pinto M.D.   On: 05/06/2019 18:26        Scheduled Meds: . amitriptyline  100 mg Oral QHS  . atorvastatin  20 mg Oral Daily  . busPIRone  10 mg Oral TID  . citalopram  20 mg Oral Daily  . docusate sodium  100 mg Oral QHS  . feeding supplement (ENSURE ENLIVE)  237 mL Oral BID BM  . ferrous sulfate  325 mg Oral Daily  . furosemide  20 mg Oral Daily  . insulin aspart  0-5 Units Subcutaneous QHS  . insulin aspart  0-9 Units Subcutaneous TID WC  . ipratropium-albuterol  3 mL Nebulization Q6H  . levothyroxine  25 mcg Oral QAC breakfast  . lipase/protease/amylase  48,000 Units Oral TID WC  . lisinopril  2.5 mg Oral Daily  . metFORMIN  1,000 mg Oral BID WC  . methocarbamol  500 mg Oral TID  . methylPREDNISolone (SOLU-MEDROL) injection  40 mg Intravenous Q12H  . mometasone-formoterol  2 puff Inhalation BID  . nicotine  7 mg Transdermal Daily  . pantoprazole (PROTONIX) IV   40 mg Intravenous Q12H  . polyethylene glycol  17 g Oral BID  . valACYclovir  500 mg Oral Daily   Continuous Infusions: . ferumoxytol    . levofloxacin (LEVAQUIN) IV 750 mg (  05/07/19 2015)     LOS: 2 days    Time spent: 30 minutes    Alma Mohiuddin Darleen Crocker, DO [facility] Pager [phone]  If 7PM-7AM, please contact night-coverage [email or web address] Password [email or web address] 05/08/2019, 11:34 AM

## 2019-05-08 NOTE — H&P (View-Only) (Signed)
Subjective: States she tastes blood when coughing. No melena since inpatient. Feels improved respiratory-wise since admission. Still getting winded when walking. Doesn't feel back to baseline. Has chronic headaches and takes BC powders 4-5 times per week. Was told she had an ulcer at first part of small intestine at time of EGD/EUS June 2020 by Dr. Newman Pies. Unable to see procedure notes.   Objective: Vital signs in last 24 hours: Temp:  [98 F (36.7 C)-98.7 F (37.1 C)] 98.5 F (36.9 C) (10/12 0800) Pulse Rate:  [94-119] 94 (10/12 0800) Resp:  [16-20] 16 (10/12 0800) BP: (98-135)/(55-79) 121/71 (10/12 0800) SpO2:  [95 %-100 %] 96 % (10/12 0800) Last BM Date: 05/06/19 General:   Alert and oriented, pleasant Head:  Normocephalic and atraumatic.  Abdomen:  Bowel sounds present, soft, non-tender, non-distended. Extremities:  Without edema. Neurologic:  Alert and  oriented x4 Psych: Normal mood and affect.  Intake/Output from previous day: 10/11 0701 - 10/12 0700 In: 2455 [P.O.:1560; I.V.:10; Blood:735; IV Piggyback:150] Out: -  Intake/Output this shift: Total I/O In: 240 [P.O.:240] Out: -   Lab Results: Recent Labs    05/06/19 1746 05/07/19 0624 05/07/19 1758 05/08/19 0627  WBC 19.8* 10.4  --  12.3*  HGB 8.5* 6.6* 8.5* 8.2*  HCT 30.0* 22.5* 29.1* 27.5*  PLT 420* 319  --  338   BMET Recent Labs    05/06/19 1746 05/07/19 0624 05/08/19 0627  NA 139 140 140  K 4.1 3.9 3.9  CL 101 104 104  CO2 24 24 24   GLUCOSE 95 199* 148*  BUN 12 11 17   CREATININE 0.74 0.63 0.76  CALCIUM 9.5 8.4* 9.0   LFT Recent Labs    05/07/19 0624  PROT 6.1*  ALBUMIN 2.8*  AST 15  ALT 12  ALKPHOS 82  BILITOT 0.2*     Studies/Results: Dg Chest Port 1 View  Result Date: 05/06/2019 CLINICAL DATA:  Pt states she has asthma and has been battling pneumonia since a hospital admission in July. Pt seen PCP at [facility] Thursday has been on multiple rounds of antibiotics. Follow-up with  PCP scheduled next week. EXAM: PORTABLE CHEST 1 VIEW COMPARISON:  Chest radiograph 02/03/2019, 04/28/2018 FINDINGS: The heart size and mediastinal contours are within normal limits. There are diffuse bilateral interstitial opacities. There are small focal opacities in the right base and left mid lung. No pneumothorax or large pleural effusion. No acute finding in the visualized skeleton. IMPRESSION: 1. Diffuse bilateral interstitial opacities could represent reactive airways disease, atypical infection, or edema. 2. Small focal opacities in the right base and left mid lung may reflect superimposed atelectasis versus infection. Electronically Signed   By: Audie Pinto M.D.   On: 05/06/2019 18:26    Assessment: 48 year old female admitted with sepsis due to CAP, acute COPD exacerbation, acute blood loss anemia with history of IDA, possible melena. Hgb improved from 6.6 on admission to 8.2, s/p 1 unit PRBCs. Notable IDA with ferritin 6 on admission. No overt GI bleeding. Interestingly, she notes that at time of EGD/EUS in June 2020 by Dr. Newman Pies at [facility], she was told she had what sounds to duodenal ulcer. NO notes available in Care Everywhere, but pathology available states mild chronic gastritis, negative H.pylori. She does endorse frequent aspirin powder use due to headaches.   History of chronic pancreatitis: followed at [facility]. EGD/EUS June 2020 with celiac plexus block, with improvement in pain this time. Results not available via Care Everywhere.   IDA: colonoscopy  in 2014 normal. EGD at time of EUS in June 2020, now with reported melena and acute on chronic anemia. Capsule study in 2014 with erosions.   Clinically improving slowly and not back to respiratory baseline. Will make NPO after midnight except for meds and reassess in the morning for possible EGD with Propofol 10/13.    Plan: Continue IV PPI BID Avoid NSAIDs Continue pancreatic enzymes NPO after midnight except sips with  meds Reassess in morning for possible EGD with Propofol Attempt to retrieve reports from [facility] (June 2020) Colonoscopy as outpatient Would hold iron in acute inpatient setting so as not to cloud clinical picture   Chelsea Needs, PhD, ANP-BC [facility]    LOS: 2 days    05/08/2019, 9:39 AM

## 2019-05-08 NOTE — Progress Notes (Signed)
Patient refused to have Levaquin infused until she has Midline placed. Patient's IV flushed without difficulty but does not flow well when on IV pump. Offered to place new IV but patient refused to previously having so many IV sticks. Educated patient on the importance of antibiotics but patient still refused to have infusion due to the fact that when the IV beeps it causes her to have a headache.

## 2019-05-09 ENCOUNTER — Encounter (HOSPITAL_COMMUNITY): Payer: Self-pay | Admitting: *Deleted

## 2019-05-09 ENCOUNTER — Inpatient Hospital Stay (HOSPITAL_COMMUNITY): Payer: Medicare Other | Admitting: Anesthesiology

## 2019-05-09 ENCOUNTER — Encounter (HOSPITAL_COMMUNITY)
Admission: EM | Disposition: A | Discharge status: Home / Self Care | Payer: Self-pay | Source: Home / Self Care | Attending: Internal Medicine

## 2019-05-09 ENCOUNTER — Telehealth: Payer: Self-pay | Admitting: Gastroenterology

## 2019-05-09 DIAGNOSIS — K2970 Gastritis, unspecified, without bleeding: Secondary | ICD-10-CM

## 2019-05-09 HISTORY — PX: ESOPHAGOGASTRODUODENOSCOPY (EGD) WITH PROPOFOL: SHX5813

## 2019-05-09 LAB — BASIC METABOLIC PANEL
Anion gap: 12 (ref 5–15)
BUN: 19 mg/dL (ref 6–20)
CO2: 26 mmol/L (ref 22–32)
Calcium: 8.9 mg/dL (ref 8.9–10.3)
Chloride: 103 mmol/L (ref 98–111)
Creatinine, Ser: 0.78 mg/dL (ref 0.44–1.00)
GFR calc Af Amer: >60 mL/min (ref >60)
GFR calc non Af Amer: >60 mL/min (ref >60)
Glucose, Bld: 83 mg/dL (ref 70–99)
Potassium: 3.7 mmol/L (ref 3.5–5.1)
Sodium: 141 mmol/L (ref 135–145)

## 2019-05-09 LAB — CBC
HCT: 27.9 % — ABNORMAL LOW (ref 36.0–46.0)
Hemoglobin: 8.2 g/dL — ABNORMAL LOW (ref 12.0–15.0)
MCH: 24 pg — ABNORMAL LOW (ref 26.0–34.0)
MCHC: 29.4 g/dL — ABNORMAL LOW (ref 30.0–36.0)
MCV: 81.6 fL (ref 80.0–100.0)
Platelets: 377 10*3/uL (ref 150–400)
RBC: 3.42 MIL/uL — ABNORMAL LOW (ref 3.87–5.11)
RDW: 17.8 % — ABNORMAL HIGH (ref 11.5–15.5)
WBC: 10.2 10*3/uL (ref 4.0–10.5)
nRBC: 0 % (ref 0.0–0.2)

## 2019-05-09 LAB — URINE CULTURE: Culture: >=100000 — AB

## 2019-05-09 LAB — GLUCOSE, CAPILLARY
Glucose-Capillary: 132 mg/dL — ABNORMAL HIGH (ref 70–99)
Glucose-Capillary: 128 mg/dL — ABNORMAL HIGH (ref 70–99)

## 2019-05-09 SURGERY — ESOPHAGOGASTRODUODENOSCOPY (EGD) WITH PROPOFOL
Anesthesia: General

## 2019-05-09 MED ORDER — LEVOFLOXACIN 750 MG PO TABS: 750.0000 mg | ORAL_TABLET | Freq: Every day | ORAL | 0 refills | Status: AC

## 2019-05-09 MED ORDER — LIDOCAINE VISCOUS HCL 2 % MT SOLN: 5.0000 mL | Freq: Once | OROMUCOSAL | Status: DC

## 2019-05-09 MED ORDER — PROPOFOL 500 MG/50ML IV EMUL
INTRAVENOUS | Status: DC | PRN
  Administered 2019-05-09: 125 ug/kg/min via INTRAVENOUS

## 2019-05-09 MED ORDER — HYDROMORPHONE HCL 1 MG/ML IJ SOLN: 0.2500 mg | INTRAMUSCULAR | Status: DC | PRN

## 2019-05-09 MED ORDER — HYDROCODONE-ACETAMINOPHEN 7.5-325 MG PO TABS: 1.0000 | ORAL_TABLET | Freq: Once | ORAL | Status: DC | PRN

## 2019-05-09 MED ORDER — LIDOCAINE 2% (20 MG/ML) 5 ML SYRINGE
INTRAMUSCULAR | Status: AC
  Filled 2019-05-09: qty 5

## 2019-05-09 MED ORDER — PROMETHAZINE HCL 25 MG/ML IJ SOLN: 6.2500 mg | INTRAMUSCULAR | Status: DC | PRN

## 2019-05-09 MED ORDER — LACTATED RINGERS IV SOLN
INTRAVENOUS | Status: DC
  Administered 2019-05-09: 1000 mL via INTRAVENOUS

## 2019-05-09 MED ORDER — KETAMINE HCL 10 MG/ML IJ SOLN
INTRAMUSCULAR | Status: DC | PRN
  Administered 2019-05-09: 20 mg via INTRAVENOUS

## 2019-05-09 MED ORDER — PREDNISONE 20 MG PO TABS: 40.0000 mg | ORAL_TABLET | Freq: Every day | ORAL | 0 refills | Status: AC

## 2019-05-09 MED ORDER — PANTOPRAZOLE SODIUM 40 MG PO TBEC: 40.0000 mg | DELAYED_RELEASE_TABLET | Freq: Two times a day (BID) | ORAL | Status: DC

## 2019-05-09 MED ORDER — KETAMINE HCL 50 MG/5ML IJ SOSY
PREFILLED_SYRINGE | INTRAMUSCULAR | Status: AC
  Filled 2019-05-09: qty 5

## 2019-05-09 MED ORDER — PROPOFOL 10 MG/ML IV BOLUS
INTRAVENOUS | Status: AC
  Filled 2019-05-09: qty 40

## 2019-05-09 NOTE — Interval H&P Note (Signed)
History and Physical Interval Note:  05/09/2019 8:34 AM  [name]  has presented today for surgery, with the diagnosis of melena, acute on chronic anemia, IDA.  The various methods of treatment have been discussed with the patient and family. After consideration of risks, benefits and other options for treatment, the patient has consented to  Procedure(s): ESOPHAGOGASTRODUODENOSCOPY (EGD) WITH PROPOFOL (N/A) as a surgical intervention.  The patient's history has been reviewed, patient examined, no change in status, stable for surgery.  I have reviewed the patient's chart and labs.  Questions were answered to the patient's satisfaction.     [employer]

## 2019-05-09 NOTE — Transfer of Care (Signed)
Immediate Anesthesia Transfer of Care Note  Patient: [name]  Procedure(s) Performed: ESOPHAGOGASTRODUODENOSCOPY (EGD) WITH PROPOFOL (N/A )  Patient Location: PACU  Anesthesia Type:MAC  Level of Consciousness: awake, alert , oriented and patient cooperative  Airway & Oxygen Therapy: Patient Spontanous Breathing and Patient connected to nasal cannula oxygen  Post-op Assessment: Report given to RN and Post -op Vital signs reviewed and stable  Post vital signs: Reviewed and stable  Last Vitals:  Vitals Value Taken Time  BP    Temp    Pulse    Resp    SpO2      Last Pain:  Vitals:   05/09/19 0736  TempSrc: Oral  PainSc: 4       Patients Stated Pain Goal: 6 (24/23/53 6144)  Complications: No apparent anesthesia complications

## 2019-05-09 NOTE — Op Note (Addendum)
[facility] Patient Name: [name] Procedure Date: 05/09/2019 8:25 AM MRN: [id number] Date of Birth: [birth year] Attending MD: Barney Drain MD, MD CSN: [id number] Age: 48 Admit Type: Outpatient Procedure:                Upper GI endoscopy, DIAGNOSTIC Indications:              Melena. LAST EGD WITH GASTRIC BIOPSIES FEB 2020                            NEGATIVE FOR H PYLORI; PMHx: DUODENAL ULCER Providers:                Barney Drain MD, MD, Otis Peak B. Sharon Seller, RN,                            Randa Spike, Technician Referring MD:             [employer]mond [employer], MD Medicines:                Propofol per Anesthesia Complications:            No immediate complications. Estimated Blood Loss:     Estimated blood loss: none. Procedure:                Pre-Anesthesia Assessment:                           - Prior to the procedure, a History and Physical                            was performed, and patient medications and                            allergies were reviewed. The patient's tolerance of                            previous anesthesia was also reviewed. The risks                            and benefits of the procedure and the sedation                            options and risks were discussed with the patient.                            All questions were answered, and informed consent                            was obtained. Prior Anticoagulants: The patient has                            taken no previous anticoagulant or antiplatelet                            agents. ASA Grade Assessment: II - A patient with  mild systemic disease. After reviewing the risks                            and benefits, the patient was deemed in                            satisfactory condition to undergo the procedure.                            After obtaining informed consent, the endoscope was                            passed under direct vision. Throughout the                             procedure, the patient's blood pressure, pulse, and                            oxygen saturations were monitored continuously. The                            [id number] scope was introduced through the                            mouth, and advanced to the second part of duodenum.                            The upper GI endoscopy was accomplished without                            difficulty. The patient tolerated the procedure                            fairly well. Scope In: 8:54:55 AM Scope Out: 8:58:35 AM Total Procedure Duration: 0 hours 3 minutes 40 seconds  Findings:      The examined esophagus was normal.      Localized mild inflammation characterized by congestion (edema) and       erythema was found in the cardia and in the gastric fundus.      The examined duodenum was normal.      A large hiatal hernia WITH ASSOCIATED ERYTHEMA/EDEMA IN THE POUCH was       present. Impression:               - NO OBVIOUS SOURCE FOR MELENA IDENTIFIED.                           - MILD Gastritis.                           - Large hiatal hernia. Moderate Sedation:      Per Anesthesia Care Recommendation:           - Return patient to hospital ward for ongoing care.                           -  Mechanical soft diet and diabetic (ADA) diet.                           - Continue present medications. PROTONIX PO BID.                           - Return to GI office(DR. CONWAY) in 4 months.                            CONSIDER EGD WITH GIVENS CAPSULE PLACEMENT IF                            CONTINUES WITH TRANSFUSION DEPENDENT ANEMIA.                           0945: discussed case, findings, and future                            management WITH DR. Newman Pies. Procedure Code(s):        --- Professional ---                           [phone], Esophagogastroduodenoscopy, flexible,                            transoral; diagnostic, including collection of                             specimen(s) by brushing or washing, when performed                            (separate procedure) Diagnosis Code(s):        --- Professional ---                           K29.70, Gastritis, unspecified, without bleeding                           K44.9, Diaphragmatic hernia without obstruction or                            gangrene                           K92.1, Melena (includes Hematochezia) CPT copyright 2019 [employer]. All rights reserved. The codes documented in this report are preliminary and upon coder review may  be revised to meet current compliance requirements. Barney Drain, MD Barney Drain MD, MD 05/09/2019 9:15:28 AM This report has been signed electronically. Number of Addenda: 0

## 2019-05-09 NOTE — Anesthesia Postprocedure Evaluation (Signed)
Anesthesia Post Note  Patient: [name]  Procedure(s) Performed: ESOPHAGOGASTRODUODENOSCOPY (EGD) WITH PROPOFOL (N/A )  Patient location during evaluation: PACU Anesthesia Type: MAC Level of consciousness: awake and alert, patient cooperative and oriented Pain management: pain level controlled Vital Signs Assessment: post-procedure vital signs reviewed and stable Respiratory status: spontaneous breathing, respiratory function stable and patient connected to nasal cannula oxygen Cardiovascular status: stable Postop Assessment: no apparent nausea or vomiting Anesthetic complications: no     Last Vitals:  Vitals:   05/09/19 0519 05/09/19 0736  BP: 138/70 (!) 141/80  Pulse: 89 84  Resp: 16 (!) 24  Temp: 37.3 C 37.1 C  SpO2: 96% 94%    Last Pain:  Vitals:   05/09/19 0736  TempSrc: Oral  PainSc: 4                  Capri Raben

## 2019-05-09 NOTE — Telephone Encounter (Signed)
FAX OCT 2020 EGD NOTE AND CONSULT NOTE TO DR.JASON CONWAY.

## 2019-05-09 NOTE — Anesthesia Preprocedure Evaluation (Signed)
Anesthesia Evaluation  Patient identified by MRN, date of birth, ID band Patient awake    Reviewed: Allergy & Precautions, NPO status , Patient's Chart, lab work & pertinent test results  Airway Mallampati: II  TM Distance: >3 FB Neck ROM: Full    Dental no notable dental hx. (+) Teeth Intact   Pulmonary asthma , pneumonia, resolved, COPD,  COPD inhaler and oxygen dependent, former smoker,  Reports home o2 3l/m, and Bipap use at home    Pulmonary exam normal breath sounds clear to auscultation       Cardiovascular Exercise Tolerance: Poor hypertension, Pt. on medications Normal cardiovascular examII Rhythm:Regular Rate:Normal  Denies CP-or known cardiac issues  Last TTE in 2018 -normal  + DOE , o2 dependent COPD   Neuro/Psych  Headaches, Anxiety Depression Bipolar Disorder  Neuromuscular disease negative psych ROS   GI/Hepatic negative GI ROS, Neg liver ROS,   Endo/Other  negative endocrine ROSdiabetes  Renal/GU negative Renal ROS  negative genitourinary   Musculoskeletal negative musculoskeletal ROS (+)   Abdominal   Peds negative pediatric ROS (+)  Hematology negative hematology ROS (+) anemia , Severe anemia -last ~8.2/27.9 Here for EGD   Anesthesia Other Findings   Reproductive/Obstetrics negative OB ROS                             Anesthesia Physical Anesthesia Plan  ASA: IV  Anesthesia Plan: General   Post-op Pain Management:    Induction: Intravenous  PONV Risk Score and Plan: 3 and TIVA, Propofol infusion, Ondansetron, Dexamethasone and Treatment may vary due to age or medical condition  Airway Management Planned: Nasal Cannula and Simple Face Mask  Additional Equipment:   Intra-op Plan:   Post-operative Plan:   Informed Consent: I have reviewed the patients History and Physical, chart, labs and discussed the procedure including the risks, benefits and alternatives  for the proposed anesthesia with the patient or authorized representative who has indicated his/her understanding and acceptance.     Dental advisory given  Plan Discussed with: CRNA  Anesthesia Plan Comments: (Plan Full PPE use  Plan GA with GETA as needed d/w pt -WTP with same after Q&A   D/w pt will try without ETT use - however if ETT necessary d/w pt possibility of Postprocedural ventilation -VU -WTP)        Anesthesia Quick Evaluation

## 2019-05-09 NOTE — Progress Notes (Signed)
IV, telemetry removed. D/C instructions reviewed with patient, verbalized understanding. Patient transported to private vehicle via wheelchair.

## 2019-05-09 NOTE — Discharge Summary (Signed)
Physician Discharge Summary  [name] [id number] DOB: 12/12/1970 DOA: 05/06/2019  PCP: Redmond School, MD  Admit date: 05/06/2019  Discharge date: 05/09/2019  Admitted From: Home  Disposition: Home  Recommendations for Outpatient Follow-up:  1. Follow up with PCP in 1-2 weeks, please consider allergy/immunology referral 2. Please follow-up with pulmonology Dr. Luan Pulling as recommended in 2 weeks to ensure stability from pulmonology standpoint 3. Follow-up with Dr. Newman Pies, GI as recommended in 4 months 4. Remain on PPI twice daily for gastritis as prescribed 5. Remain on oral prednisone as prescribed for several more days 6. Remain on Levaquin for 2 more days to complete course of treatment for pneumonia 7. Use home breathing treatments as needed for shortness of breath or wheezing  Home Health: None  Equipment/Devices: Has home oxygen  Discharge Condition: Stable  CODE STATUS: Full  Diet recommendation: Heart Healthy/carb modified  Brief/Interim Summary: Per HPI: DeloresRiersonis a48 y.o.female,w Hypertension, Hyperlipidemia, Dm2, w neuropathy, Gerd, Asthma/ Copd presents w dyspnea for the past 4 weeks worse today, along with dry cough, and subjective fever. Pt denies cp, palp, n/v, abd pain, diarrhea, brbpr, dysuria, hematuria.  Patient was admitted for community-acquired pneumonia with early sepsis as well as COPD/asthma exacerbation.  10/11:Patient appears to be doing well this morning and states that she is much less short of breath and nearing her baseline, unfortunately she has had decreasing hemoglobin levels and states that she has been more lightheaded recently with darker stools. She states that she gets chronic pain injections for chronic pancreatitis and has been noted to have upper GI ulcerations previously.  10/12: Patient appears to be doing well from a respiratory standpoint this morning and has had no further bowel movements or melena  noted.  Hemoglobin remained stable after transfusion with improvement 8.2.  GI planning for EGD prior to discharge hopefully in a.m.  10/13: Patient is stable from a respiratory standpoint and has undergone EGD this morning demonstrating large hiatal hernia as well as mild gastritis.  Recommendations are to remain on PPI twice daily and follow-up with her GI physician in 4 months time.  Hemoglobin has remained stable at 8.2 after transfusion with no further overt bleeding noted.  She will need to remain on Levaquin for couple more days to finish course of treatment for pneumonia as well as prednisone.  Will recommend that she follow-up with pulmonology as she has no lung specialist that she sees and will need referral in the future to establish care.  Additionally, she requests that she be seen by an allergist as she feels that her allergies are a main contributor to her symptoms as well.  Discharge Diagnoses:  Principal Problem:   Asthma exacerbation Active Problems:   Essential hypertension   GERD   CAP (community acquired pneumonia)   DM type 2 (diabetes mellitus, type 2) ([place])   History of pancreatitis   Sepsis ([place])   Melena  Principal discharge diagnosis: Sepsis secondary to community-acquired pneumonia with associated COPD exacerbation.  Complication of acute blood loss anemia.  Discharge Instructions  Discharge Instructions    Diet - low sodium heart healthy   Complete by: As directed    Increase activity slowly   Complete by: As directed      Allergies as of 05/09/2019      Reactions   Penicillins Shortness Of Breath    Has patient had a PCN reaction causing immediate rash, facial/tongue/throat swelling, SOB or lightheadedness with hypotension: Yes Has patient had a PCN reaction causing  severe rash involving mucus membranes or skin necrosis: Yes Has patient had a PCN reaction that required hospitalization No Has patient had a PCN reaction occurring within the last 10 years:  No  If all of the above answers are "NO", then may proceed with Cephalosporin use.   Sulfa Antibiotics Shortness Of Breath   Aspirin Nausea And Vomiting   Lovenox  [enoxaparin Sodium] Other (See Comments)   Has been known to cause her blood clots in her legs   Prednisone Other (See Comments)   Pancreatitis, but has to take for asthma sometimes      Medication List    TAKE these medications   albuterol 108 (90 Base) MCG/ACT inhaler Commonly known as: VENTOLIN HFA Inhale 2 puffs into the lungs daily as needed for wheezing. For shortness of breath   amitriptyline 100 MG tablet Commonly known as: ELAVIL Take 100 mg by mouth at bedtime.   atorvastatin 20 MG tablet Commonly known as: LIPITOR Take 20 mg by mouth daily.   budesonide-formoterol 80-4.5 MCG/ACT inhaler Commonly known as: Symbicort Inhale 2 puffs into the lungs 2 (two) times daily.   busPIRone 10 MG tablet Commonly known as: BUSPAR Take 10 mg by mouth 3 (three) times daily.   citalopram 20 MG tablet Commonly known as: CELEXA Take 20 mg by mouth daily.   docusate sodium 100 MG capsule Commonly known as: COLACE Take 100 mg by mouth at bedtime.   ferrous sulfate 325 (65 FE) MG tablet Take 325 mg by mouth daily.   furosemide 20 MG tablet Commonly known as: LASIX Take 20 mg by mouth.   gabapentin 300 MG capsule Commonly known as: NEURONTIN Take 300 mg by mouth 3 (three) times daily.   ipratropium-albuterol 0.5-2.5 (3) MG/3ML Soln Commonly known as: DUONEB Inhale 3 mLs into the lungs every 6 (six) hours as needed. Take 3 mLs by nebulization every 6 (six) hours as needed for Wheezing. What changed:   reasons to take this  additional instructions   levofloxacin 750 MG tablet Commonly known as: Levaquin Take 1 tablet (750 mg total) by mouth daily for 2 days.   levothyroxine 25 MCG tablet Commonly known as: SYNTHROID Take 25 mcg by mouth daily before breakfast.   lisinopril 2.5 MG tablet Commonly known  as: ZESTRIL Take 2.5 mg by mouth daily.   metFORMIN 500 MG tablet Commonly known as: GLUCOPHAGE Take 1,000 mg by mouth 2 (two) times daily with a meal.   methocarbamol 500 MG tablet Commonly known as: ROBAXIN Take 500 mg by mouth 3 (three) times daily.   oxycodone 30 MG immediate release tablet Commonly known as: ROXICODONE Take 30 mg by mouth 3 (three) times daily.   OXYGEN Inhale 3 L into the lungs daily as needed (for shortness of breath).   pantoprazole 40 MG tablet Commonly known as: PROTONIX Take 40 mg by mouth 2 (two) times daily.   polyethylene glycol powder 17 GM/SCOOP powder Commonly known as: GLYCOLAX/MIRALAX Take 17 g by mouth 2 (two) times daily. Until daily soft stools  OTC   predniSONE 20 MG tablet Commonly known as: Deltasone Take 2 tablets (40 mg total) by mouth daily for 5 days.   promethazine 25 MG tablet Commonly known as: PHENERGAN Take 25 mg by mouth every 6 (six) hours as needed for nausea or vomiting.   Spiriva HandiHaler 18 MCG inhalation capsule Generic drug: tiotropium Place 18 mcg into inhaler and inhale daily.   valACYclovir 500 MG tablet Commonly known as: VALTREX Take  500 mg by mouth daily.   Zenpep 20000-68000 units Cpep Generic drug: Pancrelipase (Lip-Prot-Amyl) Take 2-3 capsules by mouth 5 (five) times daily. 3 capsules three times daily with each meal and 2 capsules with each snack.   lipase/protease/amylase 12000 units Cpep capsule Commonly known as: CREON Take 12,000 Units by mouth 3 (three) times daily before meals.      Follow-up Information    Redmond School, MD Follow up in 1 week(s).   Specialty: Internal Medicine Contact information: [address] [place] [id number] [phone]        Sinda Du, MD. Schedule an appointment as soon as possible for a visit in 2 week(s).   Specialty: Pulmonary Disease Contact information: [address] [place] [address] [phone]        Dr.  Newman Pies Follow up in 4 month(s).          Allergies  Allergen Reactions  . Penicillins Shortness Of Breath     Has patient had a PCN reaction causing immediate rash, facial/tongue/throat swelling, SOB or lightheadedness with hypotension: Yes Has patient had a PCN reaction causing severe rash involving mucus membranes or skin necrosis: Yes Has patient had a PCN reaction that required hospitalization No Has patient had a PCN reaction occurring within the last 10 years: No  If all of the above answers are "NO", then may proceed with Cephalosporin use.   . Sulfa Antibiotics Shortness Of Breath  . Aspirin Nausea And Vomiting  . Lovenox  [Enoxaparin Sodium] Other (See Comments)    Has been known to cause her blood clots in her legs  . Prednisone Other (See Comments)    Pancreatitis, but has to take for asthma sometimes    Consultations:  GI   Procedures/Studies: Dg Chest Port 1 View  Result Date: 05/06/2019 CLINICAL DATA:  Pt states she has asthma and has been battling pneumonia since a hospital admission in July. Pt seen PCP at [facility] Thursday has been on multiple rounds of antibiotics. Follow-up with PCP scheduled next week. EXAM: PORTABLE CHEST 1 VIEW COMPARISON:  Chest radiograph 02/03/2019, 04/28/2018 FINDINGS: The heart size and mediastinal contours are within normal limits. There are diffuse bilateral interstitial opacities. There are small focal opacities in the right base and left mid lung. No pneumothorax or large pleural effusion. No acute finding in the visualized skeleton. IMPRESSION: 1. Diffuse bilateral interstitial opacities could represent reactive airways disease, atypical infection, or edema. 2. Small focal opacities in the right base and left mid lung may reflect superimposed atelectasis versus infection. Electronically Signed   By: Audie Pinto M.D.   On: 05/06/2019 18:26     Discharge Exam: Vitals:   05/09/19 0932 05/09/19 1000  BP: 134/71 (!) 147/83   Pulse: (!) 106 99  Resp: 12 16  Temp: 99.1 F (37.3 C)   SpO2: 92% 96%   Vitals:   05/09/19 0915 05/09/19 0930 05/09/19 0932 05/09/19 1000  BP: 130/81 135/80 134/71 (!) 147/83  Pulse: (!) 104 (!) 106 (!) 106 99  Resp: 15 10 12 16   Temp:   99.1 F (37.3 C)   TempSrc:      SpO2: 92% 92% 92% 96%  Weight:      Height:        General: Pt is alert, awake, not in acute distress Cardiovascular: RRR, S1/S2 +, no rubs, no gallops Respiratory: CTA bilaterally, no wheezing, no rhonchi, nasal cannula oxygen Abdominal: Soft, NT, ND, bowel sounds + Extremities: no edema, no cyanosis  The results of significant diagnostics from this hospitalization (including imaging, microbiology, ancillary and laboratory) are listed below for reference.     Microbiology: Recent Results (from the past 240 hour(s))  Culture, blood (routine x 2) Call MD if unable to obtain prior to antibiotics being given     Status: None (Preliminary result)   Collection Time: 05/06/19  5:44 PM   Specimen: BLOOD RIGHT ARM  Result Value Ref Range Status   Specimen Description BLOOD RIGHT ARM  Final   Special Requests   Final    BOTTLES DRAWN AEROBIC AND ANAEROBIC Blood Culture adequate volume   Culture   Final    NO GROWTH 3 DAYS Performed at [facility], [address], [place], [place] [address]    Report Status PENDING  Incomplete  SARS Coronavirus 2 by RT PCR (hospital order, performed in [place] hospital lab) Nasopharyngeal Nasopharyngeal Swab     Status: None   Collection Time: 05/06/19  5:46 PM   Specimen: Nasopharyngeal Swab  Result Value Ref Range Status   SARS Coronavirus 2 NEGATIVE NEGATIVE Final    Comment: (NOTE) If result is NEGATIVE SARS-CoV-2 target nucleic acids are NOT DETECTED. The SARS-CoV-2 RNA is generally detectable in upper and lower  respiratory specimens during the acute phase of infection. The lowest  concentration of SARS-CoV-2 viral copies this assay can detect is 250  copies  / mL. A negative result does not preclude SARS-CoV-2 infection  and should not be used as the sole basis for treatment or other  patient management decisions.  A negative result may occur with  improper specimen collection / handling, submission of specimen other  than nasopharyngeal swab, presence of viral mutation(s) within the  areas targeted by this assay, and inadequate number of viral copies  (<250 copies / mL). A negative result must be combined with clinical  observations, patient history, and epidemiological information. If result is POSITIVE SARS-CoV-2 target nucleic acids are DETECTED. The SARS-CoV-2 RNA is generally detectable in upper and lower  respiratory specimens dur ing the acute phase of infection.  Positive  results are indicative of active infection with SARS-CoV-2.  Clinical  correlation with patient history and other diagnostic information is  necessary to determine patient infection status.  Positive results do  not rule out bacterial infection or co-infection with other viruses. If result is PRESUMPTIVE POSTIVE SARS-CoV-2 nucleic acids MAY BE PRESENT.   A presumptive positive result was obtained on the submitted specimen  and confirmed on repeat testing.  While 2019 novel coronavirus  (SARS-CoV-2) nucleic acids may be present in the submitted sample  additional confirmatory testing may be necessary for epidemiological  and / or clinical management purposes  to differentiate between  SARS-CoV-2 and other Sarbecovirus currently known to infect humans.  If clinically indicated additional testing with an alternate test  methodology [phone]) is advised. The SARS-CoV-2 RNA is generally  detectable in upper and lower respiratory sp ecimens during the acute  phase of infection. The expected result is Negative. Fact Sheet for Patients:  StrictlyIdeas.no Fact Sheet for Healthcare Providers: BankingDealers.co.za This test  is not yet approved or cleared by the [place] FDA and has been authorized for detection and/or diagnosis of SARS-CoV-2 by FDA under an Emergency Use Authorization (EUA).  This EUA will remain in effect (meaning this test can be used) for the duration of the COVID-19 declaration under Section 564(b)(1) of the Act, 21 U.S.C. section 360bbb-3(b)(1), unless the authorization is terminated or revoked sooner. Performed  at [facility], [address], [place], [place] [address]   Urine culture     Status: Abnormal   Collection Time: 05/06/19  5:46 PM   Specimen: Urine, Random  Result Value Ref Range Status   Specimen Description URINE, RANDOM  Final   Special Requests NONE  Final   Culture >=100,000 COLONIES/mL STAPHYLOCOCCUS EPIDERMIDIS (A)  Final   Report Status 05/09/2019 FINAL  Final   Organism ID, Bacteria STAPHYLOCOCCUS EPIDERMIDIS (A)  Final      Susceptibility   Staphylococcus epidermidis - MIC*    CIPROFLOXACIN >=8 RESISTANT Resistant     GENTAMICIN <=0.5 SENSITIVE Sensitive     NITROFURANTOIN <=16 SENSITIVE Sensitive     OXACILLIN >=4 RESISTANT Resistant     TETRACYCLINE <=1 SENSITIVE Sensitive     VANCOMYCIN 2 SENSITIVE Sensitive     TRIMETH/SULFA <=10 SENSITIVE Sensitive     CLINDAMYCIN <=0.25 SENSITIVE Sensitive     RIFAMPIN <=0.5 SENSITIVE Sensitive     Inducible Clindamycin NEGATIVE Sensitive     * >=100,000 COLONIES/mL STAPHYLOCOCCUS EPIDERMIDIS  Culture, blood (routine x 2) Call MD if unable to obtain prior to antibiotics being given     Status: None (Preliminary result)   Collection Time: 05/06/19  7:30 PM   Specimen: Left Antecubital; Blood  Result Value Ref Range Status   Specimen Description LEFT ANTECUBITAL  Final   Special Requests   Final    BOTTLES DRAWN AEROBIC ONLY Blood Culture results may not be optimal due to an inadequate volume of blood received in culture bottles   Culture   Final    NO GROWTH 3 DAYS Performed at [facility], [address], [place], [place] [address]    Report Status PENDING  Incomplete     Labs: BNP (last 3 results) Recent Labs    05/06/19 1746  BNP 123456*   Basic Metabolic Panel: Recent Labs  Lab 05/06/19 1746 05/07/19 0624 05/08/19 0627 05/09/19 0435  NA 139 140 140 141  K 4.1 3.9 3.9 3.7  CL 101 104 104 103  CO2 24 24 24 26   GLUCOSE 95 199* 148* 83  BUN 12 11 17 19   CREATININE 0.74 0.63 0.76 0.78  CALCIUM 9.5 8.4* 9.0 8.9   Liver Function Tests: Recent Labs  Lab 05/07/19 0624  AST 15  ALT 12  ALKPHOS 82  BILITOT 0.2*  PROT 6.1*  ALBUMIN 2.8*   No results for input(s): LIPASE, AMYLASE in the last 168 hours. No results for input(s): AMMONIA in the last 168 hours. CBC: Recent Labs  Lab 05/06/19 1746 05/07/19 0624 05/07/19 1758 05/08/19 0627 05/09/19 0435  WBC 19.8* 10.4  --  12.3* 10.2  NEUTROABS 15.7*  --   --   --   --   HGB 8.5* 6.6* 8.5* 8.2* 8.2*  HCT 30.0* 22.5*  21.1* 29.1* 27.5* 27.9*  MCV 82.0 79.8*  --  80.9 81.6  PLT 420* 319  --  338 377   Cardiac Enzymes: No results for input(s): CKTOTAL, CKMB, CKMBINDEX, TROPONINI in the last 168 hours. BNP: Invalid input(s): POCBNP CBG: Recent Labs  Lab 05/08/19 1118 05/08/19 1605 05/08/19 2115 05/09/19 0734 05/09/19 0916  GLUCAP 138* 156* 173* 132* 128*   D-Dimer No results for input(s): DDIMER in the last 72 hours. Hgb A1c No results for input(s): HGBA1C in the last 72 hours. Lipid Profile No results for input(s): CHOL, HDL, LDLCALC, TRIG, CHOLHDL, LDLDIRECT in the last 72 hours. Thyroid function studies No results for  input(s): TSH, T4TOTAL, T3FREE, THYROIDAB in the last 72 hours.  Invalid input(s): FREET3 Anemia work up Recent Labs    05/07/19 0624 05/07/19 0828  VITAMINB12 142*  --   FOLATE  --  32.5  FERRITIN 6*  --   TIBC 378  --   IRON 16*  --   RETICCTPCT  --  1.2   Urinalysis    Component Value Date/Time   COLORURINE COLORLESS (A) 05/06/2019 1746   APPEARANCEUR CLEAR 05/06/2019 1746    LABSPEC 1.004 (L) 05/06/2019 1746   PHURINE 5.0 05/06/2019 1746   GLUCOSEU NEGATIVE 05/06/2019 1746   HGBUR NEGATIVE 05/06/2019 1746   BILIRUBINUR NEGATIVE 05/06/2019 1746   KETONESUR NEGATIVE 05/06/2019 1746   PROTEINUR NEGATIVE 05/06/2019 1746   UROBILINOGEN 0.2 12/06/2014 1819   NITRITE NEGATIVE 05/06/2019 1746   LEUKOCYTESUR NEGATIVE 05/06/2019 1746   Sepsis Labs Invalid input(s): PROCALCITONIN,  WBC,  LACTICIDVEN Microbiology Recent Results (from the past 240 hour(s))  Culture, blood (routine x 2) Call MD if unable to obtain prior to antibiotics being given     Status: None (Preliminary result)   Collection Time: 05/06/19  5:44 PM   Specimen: BLOOD RIGHT ARM  Result Value Ref Range Status   Specimen Description BLOOD RIGHT ARM  Final   Special Requests   Final    BOTTLES DRAWN AEROBIC AND ANAEROBIC Blood Culture adequate volume   Culture   Final    NO GROWTH 3 DAYS Performed at [facility], [address] [place], [place] [address]    Report Status PENDING  Incomplete  SARS Coronavirus 2 by RT PCR (hospital order, performed in [place] hospital lab) Nasopharyngeal Nasopharyngeal Swab     Status: None   Collection Time: 05/06/19  5:46 PM   Specimen: Nasopharyngeal Swab  Result Value Ref Range Status   SARS Coronavirus 2 NEGATIVE NEGATIVE Final    Comment: (NOTE) If result is NEGATIVE SARS-CoV-2 target nucleic acids are NOT DETECTED. The SARS-CoV-2 RNA is generally detectable in upper and lower  respiratory specimens during the acute phase of infection. The lowest  concentration of SARS-CoV-2 viral copies this assay can detect is 250  copies / mL. A negative result does not preclude SARS-CoV-2 infection  and should not be used as the sole basis for treatment or other  patient management decisions.  A negative result may occur with  improper specimen collection / handling, submission of specimen other  than nasopharyngeal swab, presence of viral mutation(s) within  the  areas targeted by this assay, and inadequate number of viral copies  (<250 copies / mL). A negative result must be combined with clinical  observations, patient history, and epidemiological information. If result is POSITIVE SARS-CoV-2 target nucleic acids are DETECTED. The SARS-CoV-2 RNA is generally detectable in upper and lower  respiratory specimens dur ing the acute phase of infection.  Positive  results are indicative of active infection with SARS-CoV-2.  Clinical  correlation with patient history and other diagnostic information is  necessary to determine patient infection status.  Positive results do  not rule out bacterial infection or co-infection with other viruses. If result is PRESUMPTIVE POSTIVE SARS-CoV-2 nucleic acids MAY BE PRESENT.   A presumptive positive result was obtained on the submitted specimen  and confirmed on repeat testing.  While 2019 novel coronavirus  (SARS-CoV-2) nucleic acids may be present in the submitted sample  additional confirmatory testing may be necessary for epidemiological  and / or clinical management purposes  to differentiate between  SARS-CoV-2 and other Sarbecovirus currently known to infect humans.  If clinically indicated additional testing with an alternate test  methodology [phone]) is advised. The SARS-CoV-2 RNA is generally  detectable in upper and lower respiratory sp ecimens during the acute  phase of infection. The expected result is Negative. Fact Sheet for Patients:  StrictlyIdeas.no Fact Sheet for Healthcare Providers: BankingDealers.co.za This test is not yet approved or cleared by the [place] FDA and has been authorized for detection and/or diagnosis of SARS-CoV-2 by FDA under an Emergency Use Authorization (EUA).  This EUA will remain in effect (meaning this test can be used) for the duration of the COVID-19 declaration under Section 564(b)(1) of the Act, 21  U.S.C. section 360bbb-3(b)(1), unless the authorization is terminated or revoked sooner. Performed at [facility], [address], [place], [place] [address]   Urine culture     Status: Abnormal   Collection Time: 05/06/19  5:46 PM   Specimen: Urine, Random  Result Value Ref Range Status   Specimen Description URINE, RANDOM  Final   Special Requests NONE  Final   Culture >=100,000 COLONIES/mL STAPHYLOCOCCUS EPIDERMIDIS (A)  Final   Report Status 05/09/2019 FINAL  Final   Organism ID, Bacteria STAPHYLOCOCCUS EPIDERMIDIS (A)  Final      Susceptibility   Staphylococcus epidermidis - MIC*    CIPROFLOXACIN >=8 RESISTANT Resistant     GENTAMICIN <=0.5 SENSITIVE Sensitive     NITROFURANTOIN <=16 SENSITIVE Sensitive     OXACILLIN >=4 RESISTANT Resistant     TETRACYCLINE <=1 SENSITIVE Sensitive     VANCOMYCIN 2 SENSITIVE Sensitive     TRIMETH/SULFA <=10 SENSITIVE Sensitive     CLINDAMYCIN <=0.25 SENSITIVE Sensitive     RIFAMPIN <=0.5 SENSITIVE Sensitive     Inducible Clindamycin NEGATIVE Sensitive     * >=100,000 COLONIES/mL STAPHYLOCOCCUS EPIDERMIDIS  Culture, blood (routine x 2) Call MD if unable to obtain prior to antibiotics being given     Status: None (Preliminary result)   Collection Time: 05/06/19  7:30 PM   Specimen: Left Antecubital; Blood  Result Value Ref Range Status   Specimen Description LEFT ANTECUBITAL  Final   Special Requests   Final    BOTTLES DRAWN AEROBIC ONLY Blood Culture results may not be optimal due to an inadequate volume of blood received in culture bottles   Culture   Final    NO GROWTH 3 DAYS Performed at [facility], [address], [place],  [address]    Report Status PENDING  Incomplete     Time coordinating discharge: 35 minutes  SIGNED:   Rodena Goldmann, DO [facility] 05/09/2019, 10:59 AM  If 7PM-7AM, please contact night-coverage [email or web address] Password [email or web address]

## 2019-05-10 ENCOUNTER — Ambulatory Visit: Payer: Self-pay | Admitting: *Deleted

## 2019-05-11 ENCOUNTER — Other Ambulatory Visit: Payer: Self-pay | Admitting: *Deleted

## 2019-05-11 LAB — CULTURE, BLOOD (ROUTINE X 2)
Culture: NO GROWTH
Culture: NO GROWTH
Special Requests: ADEQUATE

## 2019-05-11 NOTE — Telephone Encounter (Signed)
done

## 2019-05-11 NOTE — Patient Outreach (Signed)
Pt hospitalized 10/10-10/13/20 for pneumonia, primary care MD completes transition of care. Outreach call for post hospital follow up, no answer to telephone and no option to leave voicemail  Rn CM mailed unsuccessful outreach letter to pt home.  PLAN Outreach pt in 3-4 business days  Jacqlyn Larsen [facility], [place] [phone]

## 2019-05-12 DIAGNOSIS — Z6820 Body mass index (BMI) 20.0-20.9, adult: Secondary | ICD-10-CM | POA: Diagnosis not present

## 2019-05-12 DIAGNOSIS — D649 Anemia, unspecified: Secondary | ICD-10-CM | POA: Diagnosis not present

## 2019-05-12 DIAGNOSIS — J189 Pneumonia, unspecified organism: Secondary | ICD-10-CM | POA: Diagnosis not present

## 2019-05-12 DIAGNOSIS — E119 Type 2 diabetes mellitus without complications: Secondary | ICD-10-CM | POA: Diagnosis not present

## 2019-05-12 DIAGNOSIS — Z23 Encounter for immunization: Secondary | ICD-10-CM | POA: Diagnosis not present

## 2019-05-12 DIAGNOSIS — K219 Gastro-esophageal reflux disease without esophagitis: Secondary | ICD-10-CM | POA: Diagnosis not present

## 2019-05-12 DIAGNOSIS — E782 Mixed hyperlipidemia: Secondary | ICD-10-CM | POA: Diagnosis not present

## 2019-05-12 DIAGNOSIS — J449 Chronic obstructive pulmonary disease, unspecified: Secondary | ICD-10-CM | POA: Diagnosis not present

## 2019-05-15 ENCOUNTER — Encounter (HOSPITAL_COMMUNITY): Payer: Self-pay | Admitting: Gastroenterology

## 2019-05-15 DIAGNOSIS — E119 Type 2 diabetes mellitus without complications: Secondary | ICD-10-CM | POA: Diagnosis not present

## 2019-05-16 ENCOUNTER — Other Ambulatory Visit: Payer: Self-pay | Admitting: *Deleted

## 2019-05-16 NOTE — Patient Outreach (Signed)
Pt hospitalized 10/10-10/13/20 for pneumonia, Primary MD completes transition of care, Outreach call to pt for follow up, spoke with pt, HIPAA verified, pt reports " I'm slowly getting better"  Pt saw primary care MD last Friday 05/12/19 for post hospital follow up, had bloodwork done, received call from MD office and states " my numbers were off so they called in levaquin for me to start again today"  Pt states she is going to get yogurt today due to being on antibiotics for so long.  CBG 182 today, pt reports she has all medications and taking as prescribed.  Reports " breathing is good". Pt is not currently smoking.  RN CM reminded of pandemic precautions, pt did receive flu vaccine 05/12/19 and states she has "bad headache" afterwards.    THN CM Care Plan Problem One     Most Recent Value  Care Plan Problem One  Knowledge deficit related to COPD/ recent pneumonia  Role Documenting the Problem One  Care Management Coordinator  Care Plan for Problem One  Active  THN Long Term Goal   Pt will verbalize improvement in self care related to COPD/ pneumonia within 60 days  THN Long Term Goal Start Date  04/28/19 Barrie Folk re-established]  Interventions for Problem One Long Term Goal  RN CM reinforced plan of care, post hospital instructions.  [facility]N CM Short Term Goal #1   Pt will verbalize COPD action plan within 30 days  [facility]N CM Short Term Goal #1 Start Date  04/28/19 [goal re-established]  Interventions for Short Term Goal #1  RN CM reinforced COPD action plan, importance of calling MD early on for any changes in health status, pt is now on levaquin again as of today.    THN CM Care Plan Problem Two     Most Recent Value  Care Plan Problem Two  HIgh risk for falls  Role Documenting the Problem Two  Care Management [place] for Problem Two  Active  THN CM Short Term Goal #1   Pt will have appointment for outpatient PT within 30 days  [facility]N CM Short Term Goal #1 Start Date  03/28/19 [goal  re-established]  THN CM Short Term Goal #2   Pt will begin yoga routine at home within 30 days  [facility] CM Short Term Goal #2 Start Date  02/16/19  [facility] CM Short Term Goal #2 Met Date  03/15/19  THN CM Short Term Goal #3   Pt will verbalize safety precautions within 30 days  THN CM Short Term Goal #3 Start Date  02/16/19  [facility] CM Short Term Goal #3 Met Date  03/15/19      PLAN Outreach pt in 2 weeks for telephone assessment  Jacqlyn Larsen [facility] [place] Coordinator [phone]

## 2019-05-17 DIAGNOSIS — J449 Chronic obstructive pulmonary disease, unspecified: Secondary | ICD-10-CM | POA: Diagnosis not present

## 2019-05-17 DIAGNOSIS — J9610 Chronic respiratory failure, unspecified whether with hypoxia or hypercapnia: Secondary | ICD-10-CM | POA: Diagnosis not present

## 2019-05-17 DIAGNOSIS — Z79899 Other long term (current) drug therapy: Secondary | ICD-10-CM | POA: Diagnosis not present

## 2019-05-21 DIAGNOSIS — J449 Chronic obstructive pulmonary disease, unspecified: Secondary | ICD-10-CM | POA: Diagnosis not present

## 2019-05-24 ENCOUNTER — Encounter (HOSPITAL_COMMUNITY): Payer: Self-pay | Admitting: Emergency Medicine

## 2019-05-24 ENCOUNTER — Other Ambulatory Visit: Payer: Self-pay

## 2019-05-24 ENCOUNTER — Ambulatory Visit: Payer: Self-pay | Admitting: *Deleted

## 2019-05-24 ENCOUNTER — Inpatient Hospital Stay (HOSPITAL_COMMUNITY)
Admission: EM | Admit: 2019-05-24 | Discharge: 2019-05-31 | DRG: 190 | Disposition: A | Discharge status: Other Acute Inpatient Hospital | Payer: Medicare Other | Attending: Internal Medicine | Admitting: Internal Medicine

## 2019-05-24 ENCOUNTER — Emergency Department (HOSPITAL_COMMUNITY): Payer: Medicare Other

## 2019-05-24 ENCOUNTER — Other Ambulatory Visit: Payer: Self-pay | Admitting: *Deleted

## 2019-05-24 DIAGNOSIS — J9621 Acute and chronic respiratory failure with hypoxia: Secondary | ICD-10-CM | POA: Diagnosis not present

## 2019-05-24 DIAGNOSIS — R0602 Shortness of breath: Secondary | ICD-10-CM | POA: Diagnosis not present

## 2019-05-24 DIAGNOSIS — E785 Hyperlipidemia, unspecified: Secondary | ICD-10-CM | POA: Diagnosis present

## 2019-05-24 DIAGNOSIS — Z825 Family history of asthma and other chronic lower respiratory diseases: Secondary | ICD-10-CM | POA: Diagnosis not present

## 2019-05-24 DIAGNOSIS — Z7951 Long term (current) use of inhaled steroids: Secondary | ICD-10-CM

## 2019-05-24 DIAGNOSIS — E1140 Type 2 diabetes mellitus with diabetic neuropathy, unspecified: Secondary | ICD-10-CM | POA: Diagnosis not present

## 2019-05-24 DIAGNOSIS — R109 Unspecified abdominal pain: Secondary | ICD-10-CM

## 2019-05-24 DIAGNOSIS — Q2730 Arteriovenous malformation, site unspecified: Secondary | ICD-10-CM | POA: Diagnosis not present

## 2019-05-24 DIAGNOSIS — F419 Anxiety disorder, unspecified: Secondary | ICD-10-CM | POA: Diagnosis present

## 2019-05-24 DIAGNOSIS — D509 Iron deficiency anemia, unspecified: Secondary | ICD-10-CM | POA: Diagnosis not present

## 2019-05-24 DIAGNOSIS — Z79899 Other long term (current) drug therapy: Secondary | ICD-10-CM

## 2019-05-24 DIAGNOSIS — Z79891 Long term (current) use of opiate analgesic: Secondary | ICD-10-CM

## 2019-05-24 DIAGNOSIS — E538 Deficiency of other specified B group vitamins: Secondary | ICD-10-CM | POA: Diagnosis not present

## 2019-05-24 DIAGNOSIS — R1030 Lower abdominal pain, unspecified: Secondary | ICD-10-CM

## 2019-05-24 DIAGNOSIS — Z833 Family history of diabetes mellitus: Secondary | ICD-10-CM

## 2019-05-24 DIAGNOSIS — J449 Chronic obstructive pulmonary disease, unspecified: Secondary | ICD-10-CM | POA: Diagnosis not present

## 2019-05-24 DIAGNOSIS — K449 Diaphragmatic hernia without obstruction or gangrene: Secondary | ICD-10-CM | POA: Diagnosis present

## 2019-05-24 DIAGNOSIS — K5521 Angiodysplasia of colon with hemorrhage: Secondary | ICD-10-CM | POA: Diagnosis present

## 2019-05-24 DIAGNOSIS — K2101 Gastro-esophageal reflux disease with esophagitis, with bleeding: Secondary | ICD-10-CM | POA: Diagnosis present

## 2019-05-24 DIAGNOSIS — K219 Gastro-esophageal reflux disease without esophagitis: Secondary | ICD-10-CM | POA: Diagnosis not present

## 2019-05-24 DIAGNOSIS — D500 Iron deficiency anemia secondary to blood loss (chronic): Secondary | ICD-10-CM | POA: Diagnosis not present

## 2019-05-24 DIAGNOSIS — E1142 Type 2 diabetes mellitus with diabetic polyneuropathy: Secondary | ICD-10-CM | POA: Diagnosis not present

## 2019-05-24 DIAGNOSIS — K861 Other chronic pancreatitis: Secondary | ICD-10-CM | POA: Diagnosis not present

## 2019-05-24 DIAGNOSIS — I10 Essential (primary) hypertension: Secondary | ICD-10-CM | POA: Diagnosis present

## 2019-05-24 DIAGNOSIS — K5520 Angiodysplasia of colon without hemorrhage: Secondary | ICD-10-CM | POA: Diagnosis not present

## 2019-05-24 DIAGNOSIS — K311 Adult hypertrophic pyloric stenosis: Secondary | ICD-10-CM | POA: Diagnosis not present

## 2019-05-24 DIAGNOSIS — F319 Bipolar disorder, unspecified: Secondary | ICD-10-CM | POA: Diagnosis present

## 2019-05-24 DIAGNOSIS — E1165 Type 2 diabetes mellitus with hyperglycemia: Secondary | ICD-10-CM | POA: Diagnosis present

## 2019-05-24 DIAGNOSIS — K922 Gastrointestinal hemorrhage, unspecified: Secondary | ICD-10-CM | POA: Diagnosis not present

## 2019-05-24 DIAGNOSIS — D649 Anemia, unspecified: Principal | ICD-10-CM

## 2019-05-24 DIAGNOSIS — J189 Pneumonia, unspecified organism: Secondary | ICD-10-CM | POA: Diagnosis not present

## 2019-05-24 DIAGNOSIS — Z87891 Personal history of nicotine dependence: Secondary | ICD-10-CM | POA: Diagnosis not present

## 2019-05-24 DIAGNOSIS — Z9981 Dependence on supplemental oxygen: Secondary | ICD-10-CM

## 2019-05-24 DIAGNOSIS — K2100 Gastro-esophageal reflux disease with esophagitis, without bleeding: Secondary | ICD-10-CM | POA: Diagnosis not present

## 2019-05-24 DIAGNOSIS — E7849 Other hyperlipidemia: Secondary | ICD-10-CM | POA: Diagnosis not present

## 2019-05-24 DIAGNOSIS — Z79890 Hormone replacement therapy (postmenopausal): Secondary | ICD-10-CM | POA: Diagnosis not present

## 2019-05-24 DIAGNOSIS — Z7984 Long term (current) use of oral hypoglycemic drugs: Secondary | ICD-10-CM

## 2019-05-24 DIAGNOSIS — K2970 Gastritis, unspecified, without bleeding: Secondary | ICD-10-CM | POA: Diagnosis not present

## 2019-05-24 DIAGNOSIS — T380X5A Adverse effect of glucocorticoids and synthetic analogues, initial encounter: Secondary | ICD-10-CM | POA: Diagnosis present

## 2019-05-24 DIAGNOSIS — J45901 Unspecified asthma with (acute) exacerbation: Secondary | ICD-10-CM | POA: Diagnosis not present

## 2019-05-24 DIAGNOSIS — K5900 Constipation, unspecified: Secondary | ICD-10-CM | POA: Diagnosis not present

## 2019-05-24 DIAGNOSIS — Z9889 Other specified postprocedural states: Secondary | ICD-10-CM | POA: Diagnosis not present

## 2019-05-24 DIAGNOSIS — Z20828 Contact with and (suspected) exposure to other viral communicable diseases: Secondary | ICD-10-CM | POA: Diagnosis present

## 2019-05-24 DIAGNOSIS — J441 Chronic obstructive pulmonary disease with (acute) exacerbation: Secondary | ICD-10-CM | POA: Diagnosis present

## 2019-05-24 DIAGNOSIS — E119 Type 2 diabetes mellitus without complications: Secondary | ICD-10-CM

## 2019-05-24 DIAGNOSIS — Z90710 Acquired absence of both cervix and uterus: Secondary | ICD-10-CM

## 2019-05-24 DIAGNOSIS — E039 Hypothyroidism, unspecified: Secondary | ICD-10-CM | POA: Diagnosis not present

## 2019-05-24 DIAGNOSIS — K3189 Other diseases of stomach and duodenum: Secondary | ICD-10-CM | POA: Diagnosis not present

## 2019-05-24 DIAGNOSIS — K921 Melena: Secondary | ICD-10-CM | POA: Diagnosis not present

## 2019-05-24 LAB — COMPREHENSIVE METABOLIC PANEL
ALT: 15 U/L (ref 0–44)
AST: 19 U/L (ref 15–41)
Albumin: 3.1 g/dL — ABNORMAL LOW (ref 3.5–5.0)
Alkaline Phosphatase: 54 U/L (ref 38–126)
Anion gap: 8 (ref 5–15)
BUN: 23 mg/dL — ABNORMAL HIGH (ref 6–20)
CO2: 23 mmol/L (ref 22–32)
Calcium: 8.4 mg/dL — ABNORMAL LOW (ref 8.9–10.3)
Chloride: 105 mmol/L (ref 98–111)
Creatinine, Ser: 0.91 mg/dL (ref 0.44–1.00)
GFR calc Af Amer: >60 mL/min (ref >60)
GFR calc non Af Amer: >60 mL/min (ref >60)
Glucose, Bld: 205 mg/dL — ABNORMAL HIGH (ref 70–99)
Potassium: 3.7 mmol/L (ref 3.5–5.1)
Sodium: 136 mmol/L (ref 135–145)
Total Bilirubin: 0.3 mg/dL (ref 0.3–1.2)
Total Protein: 6.2 g/dL — ABNORMAL LOW (ref 6.5–8.1)

## 2019-05-24 LAB — CBC WITH DIFFERENTIAL/PLATELET
Abs Immature Granulocytes: 0.07 10*3/uL (ref 0.00–0.07)
Basophils Absolute: 0.1 10*3/uL (ref 0.0–0.1)
Basophils Relative: 0 %
Eosinophils Absolute: 1 10*3/uL — ABNORMAL HIGH (ref 0.0–0.5)
Eosinophils Relative: 9 %
HCT: 23.5 % — ABNORMAL LOW (ref 36.0–46.0)
Hemoglobin: 6.9 g/dL — CL (ref 12.0–15.0)
Immature Granulocytes: 1 %
Lymphocytes Relative: 10 %
Lymphs Abs: 1.2 10*3/uL (ref 0.7–4.0)
MCH: 25.8 pg — ABNORMAL LOW (ref 26.0–34.0)
MCHC: 29.4 g/dL — ABNORMAL LOW (ref 30.0–36.0)
MCV: 88 fL (ref 80.0–100.0)
Monocytes Absolute: 0.7 10*3/uL (ref 0.1–1.0)
Monocytes Relative: 6 %
Neutro Abs: 9 10*3/uL — ABNORMAL HIGH (ref 1.7–7.7)
Neutrophils Relative %: 74 %
Platelets: 242 10*3/uL (ref 150–400)
RBC: 2.67 MIL/uL — ABNORMAL LOW (ref 3.87–5.11)
RDW: 24.7 % — ABNORMAL HIGH (ref 11.5–15.5)
WBC: 12.1 10*3/uL — ABNORMAL HIGH (ref 4.0–10.5)
nRBC: 0 % (ref 0.0–0.2)

## 2019-05-24 LAB — POC OCCULT BLOOD, ED: Fecal Occult Bld: NEGATIVE

## 2019-05-24 LAB — PREPARE RBC (CROSSMATCH)

## 2019-05-24 LAB — MRSA PCR SCREENING: MRSA by PCR: NEGATIVE

## 2019-05-24 LAB — CBG MONITORING, ED: Glucose-Capillary: 201 mg/dL — ABNORMAL HIGH (ref 70–99)

## 2019-05-24 MED ORDER — SODIUM CHLORIDE 0.9 % IV SOLN: 2.0000 g | Freq: Once | INTRAVENOUS | Status: DC

## 2019-05-24 MED ORDER — VANCOMYCIN HCL IN DEXTROSE 1-5 GM/200ML-% IV SOLN: 1000.0000 mg | Freq: Once | INTRAVENOUS | Status: DC

## 2019-05-24 MED ORDER — UMECLIDINIUM BROMIDE 62.5 MCG/INH IN AEPB
1.0000 | INHALATION_SPRAY | Freq: Every day | RESPIRATORY_TRACT | Status: DC
  Administered 2019-05-25 – 2019-05-31 (×6): 1 via RESPIRATORY_TRACT
  Filled 2019-05-24: qty 7

## 2019-05-24 MED ORDER — VANCOMYCIN HCL IN DEXTROSE 750-5 MG/150ML-% IV SOLN: 750.0000 mg | Freq: Two times a day (BID) | INTRAVENOUS | Status: DC

## 2019-05-24 MED ORDER — SODIUM CHLORIDE 0.9 % IV SOLN
2.0000 g | INTRAVENOUS | Status: DC
  Administered 2019-05-24: 2 g via INTRAVENOUS
  Filled 2019-05-24: qty 20

## 2019-05-24 MED ORDER — ONDANSETRON HCL 4 MG/2ML IJ SOLN
4.0000 mg | Freq: Four times a day (QID) | INTRAMUSCULAR | Status: DC | PRN
  Administered 2019-05-27 – 2019-05-28 (×2): 4 mg via INTRAVENOUS
  Filled 2019-05-24 (×2): qty 2

## 2019-05-24 MED ORDER — LISINOPRIL 5 MG PO TABS
2.5000 mg | ORAL_TABLET | Freq: Every day | ORAL | Status: DC
  Administered 2019-05-25 – 2019-05-30 (×6): 2.5 mg via ORAL
  Filled 2019-05-24 (×6): qty 1

## 2019-05-24 MED ORDER — GABAPENTIN 300 MG PO CAPS
300.0000 mg | ORAL_CAPSULE | Freq: Three times a day (TID) | ORAL | Status: DC
  Administered 2019-05-24 – 2019-05-30 (×19): 300 mg via ORAL
  Filled 2019-05-24 (×19): qty 1

## 2019-05-24 MED ORDER — METHYLPREDNISOLONE SODIUM SUCC 125 MG IJ SOLR
125.0000 mg | Freq: Once | INTRAMUSCULAR | Status: AC
  Administered 2019-05-24: 125 mg via INTRAVENOUS
  Filled 2019-05-24: qty 2

## 2019-05-24 MED ORDER — ALBUTEROL SULFATE HFA 108 (90 BASE) MCG/ACT IN AERS
4.0000 | INHALATION_SPRAY | RESPIRATORY_TRACT | Status: DC | PRN
  Administered 2019-05-24: 4 via RESPIRATORY_TRACT
  Filled 2019-05-24: qty 6.7

## 2019-05-24 MED ORDER — ALBUTEROL SULFATE HFA 108 (90 BASE) MCG/ACT IN AERS
2.0000 | INHALATION_SPRAY | Freq: Every day | RESPIRATORY_TRACT | Status: DC | PRN
  Filled 2019-05-24: qty 6.7

## 2019-05-24 MED ORDER — FLUTICASONE FUROATE-VILANTEROL 100-25 MCG/INH IN AEPB
1.0000 | INHALATION_SPRAY | Freq: Every day | RESPIRATORY_TRACT | Status: DC
  Administered 2019-05-25 – 2019-05-31 (×6): 1 via RESPIRATORY_TRACT
  Filled 2019-05-24: qty 28

## 2019-05-24 MED ORDER — ALBUTEROL SULFATE HFA 108 (90 BASE) MCG/ACT IN AERS
4.0000 | INHALATION_SPRAY | RESPIRATORY_TRACT | Status: DC | PRN
  Filled 2019-05-24: qty 6.7

## 2019-05-24 MED ORDER — VALACYCLOVIR HCL 500 MG PO TABS
500.0000 mg | ORAL_TABLET | Freq: Every day | ORAL | Status: DC
  Administered 2019-05-25 – 2019-05-30 (×6): 500 mg via ORAL
  Filled 2019-05-24 (×6): qty 1

## 2019-05-24 MED ORDER — SODIUM CHLORIDE 0.9 % IV SOLN
500.0000 mg | INTRAVENOUS | Status: DC
  Administered 2019-05-24: 500 mg via INTRAVENOUS
  Filled 2019-05-24: qty 500

## 2019-05-24 MED ORDER — ATORVASTATIN CALCIUM 20 MG PO TABS
20.0000 mg | ORAL_TABLET | Freq: Every day | ORAL | Status: DC
  Administered 2019-05-25 – 2019-05-30 (×6): 20 mg via ORAL
  Filled 2019-05-24 (×6): qty 1

## 2019-05-24 MED ORDER — TIOTROPIUM BROMIDE MONOHYDRATE 18 MCG IN CAPS: 18.0000 ug | ORAL_CAPSULE | Freq: Every day | RESPIRATORY_TRACT | Status: DC

## 2019-05-24 MED ORDER — FLUTICASONE FUROATE-VILANTEROL 100-25 MCG/INH IN AEPB: 1.0000 | INHALATION_SPRAY | Freq: Every day | RESPIRATORY_TRACT | Status: DC

## 2019-05-24 MED ORDER — SODIUM CHLORIDE 0.9 % IV SOLN: 1.0000 g | Freq: Once | INTRAVENOUS | Status: DC

## 2019-05-24 MED ORDER — OXYCODONE HCL 5 MG PO TABS
15.0000 mg | ORAL_TABLET | Freq: Four times a day (QID) | ORAL | Status: DC | PRN
  Administered 2019-05-24 – 2019-05-31 (×21): 15 mg via ORAL
  Filled 2019-05-24 (×22): qty 3

## 2019-05-24 MED ORDER — METHYLPREDNISOLONE SODIUM SUCC 40 MG IJ SOLR
40.0000 mg | Freq: Two times a day (BID) | INTRAMUSCULAR | Status: DC
  Administered 2019-05-25 – 2019-05-27 (×5): 40 mg via INTRAVENOUS
  Filled 2019-05-24 (×5): qty 1

## 2019-05-24 MED ORDER — INSULIN ASPART 100 UNIT/ML ~~LOC~~ SOLN
0.0000 [IU] | Freq: Every day | SUBCUTANEOUS | Status: DC
  Administered 2019-05-24: 2 [IU] via SUBCUTANEOUS
  Administered 2019-05-29: 3 [IU] via SUBCUTANEOUS
  Filled 2019-05-24: qty 1

## 2019-05-24 MED ORDER — SODIUM CHLORIDE 0.9 % IV SOLN
10.0000 mL/h | Freq: Once | INTRAVENOUS | Status: AC
  Administered 2019-05-24: 10 mL/h via INTRAVENOUS

## 2019-05-24 MED ORDER — PANCRELIPASE (LIP-PROT-AMYL) 12000-38000 UNITS PO CPEP: 24000.0000 [IU] | ORAL_CAPSULE | Freq: Three times a day (TID) | ORAL | Status: DC

## 2019-05-24 MED ORDER — PANCRELIPASE (LIP-PROT-AMYL) 20000-63000 UNITS PO CPEP: 1.0000 | ORAL_CAPSULE | Freq: Three times a day (TID) | ORAL | Status: DC

## 2019-05-24 MED ORDER — ONDANSETRON HCL 4 MG PO TABS
4.0000 mg | ORAL_TABLET | Freq: Four times a day (QID) | ORAL | Status: DC | PRN
  Administered 2019-05-29 – 2019-05-31 (×2): 4 mg via ORAL
  Filled 2019-05-24 (×2): qty 1

## 2019-05-24 MED ORDER — LEVOTHYROXINE SODIUM 25 MCG PO TABS
25.0000 ug | ORAL_TABLET | Freq: Every day | ORAL | Status: DC
  Administered 2019-05-25 – 2019-05-31 (×7): 25 ug via ORAL
  Filled 2019-05-24 (×7): qty 1

## 2019-05-24 MED ORDER — INSULIN ASPART 100 UNIT/ML ~~LOC~~ SOLN
0.0000 [IU] | Freq: Three times a day (TID) | SUBCUTANEOUS | Status: DC
  Administered 2019-05-25: 8 [IU] via SUBCUTANEOUS
  Administered 2019-05-25: 3 [IU] via SUBCUTANEOUS
  Administered 2019-05-26: 5 [IU] via SUBCUTANEOUS
  Administered 2019-05-27: 2 [IU] via SUBCUTANEOUS
  Administered 2019-05-27 (×2): 3 [IU] via SUBCUTANEOUS
  Administered 2019-05-28: 2 [IU] via SUBCUTANEOUS
  Administered 2019-05-28: 3 [IU] via SUBCUTANEOUS
  Administered 2019-05-28: 11 [IU] via SUBCUTANEOUS
  Administered 2019-05-29: 5 [IU] via SUBCUTANEOUS
  Administered 2019-05-29 (×2): 2 [IU] via SUBCUTANEOUS
  Administered 2019-05-30: 3 [IU] via SUBCUTANEOUS
  Administered 2019-05-30 (×2): 2 [IU] via SUBCUTANEOUS

## 2019-05-24 NOTE — Patient Outreach (Signed)
[facility]) Care Management  05/24/2019  [name] 07-13-1971 [id number]  CSW attempted to reach pt by phone today and was unsuccessful. CSW left a HIPPA compliant voice message and will attempt again in 3-4 business days if no return call is received.   Eduard Clos, MSW, [place] Worker  [place] [phone]

## 2019-05-24 NOTE — ED Triage Notes (Signed)
Patient has been battling pneumonia since summer. Hospitalized 2 weeks ago. Patient states cough has become worse and she is having difficulty breathing.

## 2019-05-24 NOTE — ED Notes (Signed)
Date and time results received: 05/24/19 1725  Test: Hgb Critical Value: 6.9  Name of Provider Notified: Dr. Alvino Chapel  Orders Received? Or Actions Taken?: See chart

## 2019-05-24 NOTE — Progress Notes (Signed)
Pt has tolerated ceftriaxone in the past. The risk for crossreativity with PCN is <1. D/w Dr Alvino Chapel and we will do ceftriaxone for the CAP. Dr Waldron Labs would like to add azithromycin to ceftriaxone.   Onnie Boer, PharmD, BCIDP, AAHIVP, CPP Infectious Disease Pharmacist 05/24/2019 8:24 PM

## 2019-05-24 NOTE — H&P (Signed)
TRH H&P   Patient Demographics:    [name], is a 48 y.o. female  MRN: [id number]   DOB - 03-16-1971  Admit Date - 05/24/2019  Outpatient Primary MD for the patient is Redmond School, MD  Referring MD/NP/PA: Dr Alvino Chapel  Patient coming from: Home  Chief Complaint  Patient presents with  . Shortness of Breath      HPI:    [name]  is a 48 y.o. female, w Hypertension, Hyperlipidemia, Dm2, w neuropathy, Gerd, Asthma/ Copd, on oxygen at home, 3 L nasal cannula intermittently, patient presents to ED secondary to complaints of shortness of breath, patient with recent hospitalization secondary to asthma exacerbation, and anemia, status post endoscopy and PRBC transfusion, patient presents to ED secondary to complaints of dyspnea, 1 week, worsening over the last 2 days, as well she reports her phlegm, becoming productive, green in color, she denies any hemoptysis, chest pain, fever or chills, he denies any melena, coffee-ground emesis, nausea or vomiting. - in ED patient was noted to have anemia with hemoglobin of 6.9, as well her chest x-ray significant opacities questionable for pneumonia, she had significant wheezing on presentation, which she did require IV steroids.    Review of systems:    In addition to the HPI above,  No Fever-chills, No Headache, No changes with Vision or hearing, No problems swallowing food or Liquids, No Chest pain, reports cough, productive and shortness of breath No Abdominal pain, No Nausea or Vommitting, Bowel movements are regular, No Blood in stool or Urine, No dysuria, No new skin rashes or bruises, No new joints pains-aches,  No new weakness, tingling, numbness in any extremity, No recent weight gain or loss, No polyuria, polydypsia or polyphagia, No significant Mental Stressors.  A full 10 point Review of Systems was done,  except as stated above, all other Review of Systems were negative.   With Past History of the following :    Past Medical History:  Diagnosis Date  . Anemia   . Anxiety   . Asthma   . Bipolar 1 disorder ([place])   . Chronic abdominal pain   . COPD (chronic obstructive pulmonary disease) ([place])   . Depression   . Diabetes mellitus   . Diabetic neuropathy ([place]) 10/29/2017  . Esophagitis   . Hiatal hernia   . Hyperlipidemia 02/03/2012  . Migraine   . Neck pain 06/08/2012  . Pancreatitis chronic Idiopathic   Treated by Dr. Newman Pies at [facility] in the past with celiac blocks.      Past Surgical History:  Procedure Laterality Date  . ABDOMINAL HYSTERECTOMY    . CELIAC PLEXUS BLOCK    . CHOLECYSTECTOMY    . COLONOSCOPY N/A 12/05/2012   [id number] rectum, colon and terminal ileum  . ESOPHAGOGASTRODUODENOSCOPY  02/20/2008   [id number] distal esophageal mucosa, suspicious for neoplasm/Hiatal hernia otherwise normal   . ESOPHAGOGASTRODUODENOSCOPY  04/03/2008   [id number] hiatal hernia, otherwise normal/Short, tight, benign-appearing peptic stricture  . ESOPHAGOGASTRODUODENOSCOPY  November 16, 2012   Dr. Britta Mccreedy: hiatal hernia, esophagitis,   . ESOPHAGOGASTRODUODENOSCOPY (EGD) WITH PROPOFOL N/A 05/09/2019   Procedure: ESOPHAGOGASTRODUODENOSCOPY (EGD) WITH PROPOFOL;  Surgeon: Danie Binder, MD;  Location: [facility];  Service: Endoscopy;  Laterality: N/A;  . GIVENS CAPSULE STUDY N/A 12/05/2012   Few superficial erosions but nothing found to explain iron deficiency anemia.   . Ileocolonoscopy  06/05/2008     [id number] anal canal, otherwise normal rectum, colon  . TONSILLECTOMY        Social History:     Social History   Tobacco Use  . Smoking status: Former Research scientist (life sciences)  . Smokeless tobacco: Never Used  Substance Use Topics  . Alcohol use: No       Family History :     Family History  Problem Relation Age of Onset  . Asthma Other   . Asthma Paternal  Grandfather   . Heart attack Paternal Grandfather   . Hypertension Mother   . Cancer Maternal Grandmother   . Diabetes Paternal Grandmother   . Coronary artery disease Neg Hx   . Colon cancer Neg Hx       Home Medications:   Prior to Admission medications   Medication Sig Start Date End Date Taking? Authorizing Provider  albuterol (PROVENTIL HFA;VENTOLIN HFA) 108 (90 BASE) MCG/ACT inhaler Inhale 2 puffs into the lungs daily as needed for wheezing. For shortness of breath   Yes [provider]  albuterol (PROVENTIL) (2.5 MG/3ML) 0.083% nebulizer solution Take 2.5 mg by nebulization every 6 (six) hours as needed for wheezing or shortness of breath.  05/24/19  Yes [provider]  amitriptyline (ELAVIL) 100 MG tablet Take 100 mg by mouth at bedtime.   Yes [provider]  atorvastatin (LIPITOR) 20 MG tablet Take 20 mg by mouth daily.   Yes [provider]  budesonide-formoterol (SYMBICORT) 80-4.5 MCG/ACT inhaler Inhale 2 puffs into the lungs 2 (two) times daily. 03/30/14  Yes Samuella Cota, MD  busPIRone (BUSPAR) 10 MG tablet Take 10 mg by mouth 3 (three) times daily.   Yes [provider]  citalopram (CELEXA) 40 MG tablet Take 40 mg by mouth daily. 05/17/19  Yes [provider]  docusate sodium (COLACE) 100 MG capsule Take 100 mg by mouth at bedtime.    Yes [provider]  ferrous sulfate 325 (65 FE) MG tablet Take 325 mg by mouth daily.   Yes [provider]  furosemide (LASIX) 20 MG tablet Take 20 mg by mouth daily.    Yes [provider]  gabapentin (NEURONTIN) 300 MG capsule Take 300 mg by mouth 3 (three) times daily.  04/11/19  Yes [provider]  levothyroxine (SYNTHROID, LEVOTHROID) 25 MCG tablet Take 25 mcg by mouth daily before breakfast.   Yes [provider]  lipase/protease/amylase (CREON) 12000 units CPEP capsule Take 12,000 Units by mouth 3 (three) times daily before meals.     Yes [provider]  lisinopril (PRINIVIL,ZESTRIL) 2.5 MG tablet Take 2.5 mg by mouth daily.  01/18/18  Yes [provider]  metFORMIN (GLUCOPHAGE) 500 MG tablet Take 1,000 mg by mouth 2 (two) times daily with a meal.  07/01/13  Yes Ghimire, Henreitta Leber, MD  methocarbamol (ROBAXIN) 500 MG tablet Take 500 mg by mouth 3 (three) times daily.   Yes [provider]  oxyCODONE (ROXICODONE) 15 MG immediate release tablet  Take 15 mg by mouth 4 (four) times daily.  05/22/19  Yes [provider]  OXYGEN Inhale 3 L into the lungs daily as needed (for shortness of breath).   Yes [provider]  pantoprazole (PROTONIX) 40 MG tablet Take 40 mg by mouth 2 (two) times daily.   Yes [provider]  polyethylene glycol powder (GLYCOLAX/MIRALAX) powder Take 17 g by mouth 2 (two) times daily. Until daily soft stools  OTC 03/05/15  Yes Nona Dell, PA-C  promethazine (PHENERGAN) 25 MG tablet Take 25 mg by mouth every 6 (six) hours as needed for nausea or vomiting.   Yes [provider]  SPIRIVA HANDIHALER 18 MCG inhalation capsule Place 18 mcg into inhaler and inhale daily.  04/11/19  Yes [provider]  valACYclovir (VALTREX) 500 MG tablet Take 500 mg by mouth daily.   Yes [provider]  ZENPEP 20000-63000 units CPEP Take 1 capsule by mouth 3 (three) times daily after meals.  05/08/19  Yes [provider]  levofloxacin (LEVAQUIN) 500 MG tablet Take 500 mg by mouth daily. 7 day course prescribed on 05/16/2019 05/16/19   [provider]     Allergies:     Allergies  Allergen Reactions  . Penicillins Shortness Of Breath     Has patient had a PCN reaction causing immediate rash, facial/tongue/throat swelling, SOB or lightheadedness with hypotension: Yes Has patient had a PCN reaction causing severe rash involving mucus membranes or skin necrosis: Yes Has patient had a PCN reaction that required  hospitalization No Has patient had a PCN reaction occurring within the last 10 years: No  If all of the above answers are "NO", then may proceed with Cephalosporin use. Tolerated ceftriaxone  . Sulfa Antibiotics Shortness Of Breath  . Aspirin Nausea And Vomiting  . Lovenox  [Enoxaparin Sodium] Other (See Comments)    Has been known to cause her blood clots in her legs  . Prednisone Other (See Comments)    Pancreatitis, but has to take for asthma sometimes     Physical Exam:   Vitals  Blood pressure (!) 105/55, pulse (!) 108, temperature 98.8 F (37.1 C), resp. rate 12, height 5\' 2"  (1.575 m), weight 54 kg, SpO2 99 %.   1. General well-developed female, laying in bed in mild distress secondary to increased work of breathing.  2. Normal affect and insight, Not Suicidal or Homicidal, Awake Alert, Oriented X 3.  3. No F.N deficits, ALL C.Nerves Intact, Strength 5/5 all 4 extremities, Sensation intact all 4 extremities, Plantars down going.  4. Ears and Eyes appear Normal, Conjunctivae clear, PERRLA. Moist Oral Mucosa.  5. Supple Neck, No JVD, No cervical lymphadenopathy appriciated, No Carotid Bruits.  6. Symmetrical Chest wall movement, Good air movement bilaterally, diffuse wheezing.  7. RRR, No Gallops, Rubs or Murmurs, No Parasternal Heave.  8. Positive Bowel Sounds, Abdomen Soft, No tenderness, No organomegaly appriciated,No rebound -guarding or rigidity.  9.  No Cyanosis, Normal Skin Turgor, No Skin Rash or Bruise.  10. Good muscle tone,  joints appear normal , no effusions, Normal ROM.  11. No Palpable Lymph Nodes in Neck or Axillae    Data Review:    CBC Recent Labs  Lab 05/24/19 1618  WBC 12.1*  HGB 6.9*  HCT 23.5*  PLT 242  MCV 88.0  MCH 25.8*  MCHC 29.4*  RDW 24.7*  LYMPHSABS 1.2  MONOABS 0.7  EOSABS 1.0*  BASOSABS 0.1   ------------------------------------------------------------------------------------------------------------------  Chemistries  Recent Labs  Lab 05/24/19 1618  NA 136  K 3.7  CL 105  CO2 23  GLUCOSE 205*  BUN 23*  CREATININE 0.91  CALCIUM 8.4*  AST 19  ALT 15  ALKPHOS 54  BILITOT 0.3   ------------------------------------------------------------------------------------------------------------------ estimated creatinine clearance is 58.5 mL/min (by C-G formula based on SCr of 0.91 mg/dL). ------------------------------------------------------------------------------------------------------------------ No results for input(s): TSH, T4TOTAL, T3FREE, THYROIDAB in the last 72 hours.  Invalid input(s): FREET3  Coagulation profile No results for input(s): INR, PROTIME in the last 168 hours. ------------------------------------------------------------------------------------------------------------------- No results for input(s): DDIMER in the last 72 hours. -------------------------------------------------------------------------------------------------------------------  Cardiac Enzymes No results for input(s): CKMB, TROPONINI, MYOGLOBIN in the last 168 hours.  Invalid input(s): CK ------------------------------------------------------------------------------------------------------------------    Component Value Date/Time   BNP 122.0 (H) 05/06/2019 1746     ---------------------------------------------------------------------------------------------------------------  Urinalysis    Component Value Date/Time   COLORURINE COLORLESS (A) 05/06/2019 1746   APPEARANCEUR CLEAR 05/06/2019 1746   LABSPEC 1.004 (L) 05/06/2019 1746   PHURINE 5.0 05/06/2019 1746   GLUCOSEU NEGATIVE 05/06/2019 1746   HGBUR NEGATIVE 05/06/2019 1746   BILIRUBINUR NEGATIVE 05/06/2019 1746   KETONESUR NEGATIVE 05/06/2019 1746   PROTEINUR NEGATIVE 05/06/2019 1746   UROBILINOGEN 0.2 12/06/2014 1819   NITRITE NEGATIVE 05/06/2019 1746   LEUKOCYTESUR NEGATIVE 05/06/2019 1746     ----------------------------------------------------------------------------------------------------------------   Imaging Results:    Dg Chest Portable 1 View  Result Date: 05/24/2019 CLINICAL DATA:  Shortness of breath, pneumonia since summer, recent hospitalization EXAM: PORTABLE CHEST 1 VIEW COMPARISON:  Radiograph 05/06/2019 FINDINGS: Bandlike areas of opacities in the mid lungs may reflect subsegmental atelectasis. There are chronic bronchitic changes throughout the lungs with diffuse airways thickening. Some patchy suprahilar and apical opacities are present. No pneumothorax or effusion. Cardiomediastinal contours are stable from prior including a calcified aorta. No acute osseous or soft tissue abnormality. IMPRESSION: 1. Bandlike areas of opacities in the mid lungs may reflect subsegmental atelectasis. 2. Some patchy suprahilar and apical opacities are present with airways thickening and bronchitic features could reflect ongoing infection or inflammation. Electronically Signed   By: Lovena Le M.D.   On: 05/24/2019 16:58      Assessment & Plan:    Active Problems:   ANEMIA, IRON DEFICIENCY   Essential hypertension   GERD   Asthma exacerbation   CAP (community acquired pneumonia)   COPD exacerbation ([place])   DM type 2 (diabetes mellitus, type 2) ([place])   Diabetic neuropathy ([place])   Acute COPD exacerbation/chronic hypoxic respiratory failure -Patient with significant wheezing, increased work of breathing on presentation, chest x-ray with questionable infiltrate for CAP, will continue with IV Rocephin and azithromycin, will continue with IV steroids(we will keep the dose on the lower side for suspicion of gastritis), will continue with Combivent, as needed albuterol. -Follow COVID-19 test results  Chronic blood loss anemia -Patient with recent endoscopy significant for gastritis, likely she will need capsule endoscopy per GI note if she continues to have recurrent  anemia. -Received 2 units PRBC transfusion. -Will consult GI to see if they want to arrange for capsule endoscopy.  Hypertension -Of blood pressure, hold medications  Dyslipidemia -Continue Lipitor  Type 2 diabetes with mild steroid-induced hyperglycemia -On Metformin and continue with insulin sliding scale  Hypothyroidism -Continue levothyroxine  Anxiety/depression -Continue current meds  Pancreatic insufficiency with history of chronic pancreatitis -Continue pancreatic enzymes while hospitalized   DVT Prophylaxis SCDs  AM Labs Ordered, also please review Full Orders  Family Communication: Admission, patients condition and  plan of care including tests being ordered have been discussed with the patient who indicate understanding and agree with the plan and Code Status.  Code Status Full  Likely DC to  Home  Condition GUARDED   Consults called:   GI consult in EPIC  Admission status:  inpatient  Time spent in minutes : 60 minutes   Phillips Climes M.D on 05/24/2019 at 9:56 PM  Between 7am to 7pm - Pager - [phone]. After 7pm go to [email or web address] - password [facility]  [facility] - Office  [phone]

## 2019-05-24 NOTE — ED Provider Notes (Signed)
[facility] EMERGENCY DEPARTMENT Provider Note   CSN: [id number] Arrival date & time: 05/24/19  1418     History   Chief Complaint Chief Complaint  Patient presents with  . Shortness of Breath    HPI [name] is a 48 y.o. female.     HPI Patient presents with shortness of breath.  Around 1 week ago was admitted to hospital for asthma and pneumonia.  States [name] been doing well for about a week and then started to worsen earlier this week.  States [name] had a cough that is worsened but no real sputum production.  Has brief improvement with her nebulizer.  No chest pain.  States [name] does not know if [name] has had fever since [name] does not have a thermometer.  No abdominal pain.  States [name] had antibiotics leaving the hospital but was not given steroids.  Reviewing records it appears as though [name] was supposed to be on steroids. Past Medical History:  Diagnosis Date  . Anemia   . Anxiety   . Asthma   . Bipolar 1 disorder ([place])   . Chronic abdominal pain   . COPD (chronic obstructive pulmonary disease) ([place])   . Depression   . Diabetes mellitus   . Diabetic neuropathy ([place]) 10/29/2017  . Esophagitis   . Hiatal hernia   . Hyperlipidemia 02/03/2012  . Migraine   . Neck pain 06/08/2012  . Pancreatitis chronic Idiopathic   Treated by Dr. Newman Pies at [facility] in the past with celiac blocks.    Patient Active Problem List   Diagnosis Date Noted  . Melena   . History of pancreatitis 05/06/2019  . Sepsis ([place]) 05/06/2019  . Diabetic neuropathy ([place]) 10/29/2017  . DM type 2 (diabetes mellitus, type 2) ([place]) 03/28/2014  . COPD exacerbation ([place]) 03/27/2014  . CAP (community acquired pneumonia) 06/29/2013  . Acute bronchitis 06/29/2013  . Anxiety 02/27/2013  . Abdominal cramping 02/27/2013  . BRBPR (bright red blood per rectum) 12/02/2012  . Constipation/Stool Impaction 12/02/2012  . Acute blood loss anemia 12/02/2012  . Pancreatitis 11/24/2012  . Hyperglycemia,  drug-induced 06/08/2012  . Arm paresthesia, left 06/07/2012  . Neck pain on left side 06/07/2012  . Asthma exacerbation 04/05/2012  . Acute otitis media 02/04/2012  . Hyperlipidemia 02/03/2012  . Acute respiratory failure with hypoxia ([employer]) 09/23/2011  . Tobacco abuse 09/23/2011  . Constipation 08/29/2011  . Hypokalemia 07/15/2011  . Muscle spasm 04/24/2011  . Otitis externa 04/24/2011  . Idiopathic acute pancreatitis 04/24/2011  . ANEMIA, IRON DEFICIENCY 01/04/2009  . ANXIETY 01/04/2009  . Bipolar disorder ([place]) 01/04/2009  . MIGRAINE HEADACHE 01/04/2009  . Essential hypertension 01/04/2009  . ASTHMA 01/04/2009  . ESOPHAGEAL STRICTURE 01/04/2009  . GERD 01/04/2009  . HIATAL HERNIA 01/04/2009  . HEMATOCHEZIA 01/04/2009  . Diarrhea 01/04/2009  . ABDOMINAL PAIN 01/04/2009    Past Surgical History:  Procedure Laterality Date  . ABDOMINAL HYSTERECTOMY    . CELIAC PLEXUS BLOCK    . CHOLECYSTECTOMY    . COLONOSCOPY N/A 12/05/2012   [id number] rectum, colon and terminal ileum  . ESOPHAGOGASTRODUODENOSCOPY  02/20/2008   [id number] distal esophageal mucosa, suspicious for neoplasm/Hiatal hernia otherwise normal   . ESOPHAGOGASTRODUODENOSCOPY  04/03/2008   [id number] hiatal hernia, otherwise normal/Short, tight, benign-appearing peptic stricture  . ESOPHAGOGASTRODUODENOSCOPY  November 16, 2012   Dr. Britta Mccreedy: hiatal hernia, esophagitis,   . ESOPHAGOGASTRODUODENOSCOPY (EGD) WITH PROPOFOL N/A 05/09/2019   Procedure: ESOPHAGOGASTRODUODENOSCOPY (EGD) WITH PROPOFOL;  Surgeon: Danie Binder, MD;  Location: [facility] SUITE;  Service: Endoscopy;  Laterality: N/A;  . GIVENS CAPSULE STUDY N/A 12/05/2012   Few superficial erosions but nothing found to explain iron deficiency anemia.   . Ileocolonoscopy  06/05/2008     [id number] anal canal, otherwise normal rectum, colon  . TONSILLECTOMY       OB History    Gravida  2   Para  2   Term  2   Preterm      AB      Living         SAB      TAB      Ectopic      Multiple      Live Births               Home Medications    Prior to Admission medications   Medication Sig Start Date End Date Taking? Authorizing Provider  albuterol (PROVENTIL HFA;VENTOLIN HFA) 108 (90 BASE) MCG/ACT inhaler Inhale 2 puffs into the lungs daily as needed for wheezing. For shortness of breath    [provider]  albuterol (PROVENTIL) (2.5 MG/3ML) 0.083% nebulizer solution  05/24/19   [provider]  amitriptyline (ELAVIL) 100 MG tablet Take 100 mg by mouth at bedtime.    [provider]  atorvastatin (LIPITOR) 20 MG tablet Take 20 mg by mouth daily.    [provider]  budesonide-formoterol (SYMBICORT) 80-4.5 MCG/ACT inhaler Inhale 2 puffs into the lungs 2 (two) times daily. 03/30/14   Samuella Cota, MD  busPIRone (BUSPAR) 10 MG tablet Take 10 mg by mouth 3 (three) times daily.    [provider]  citalopram (CELEXA) 20 MG tablet Take 20 mg by mouth daily.    [provider]  citalopram (CELEXA) 40 MG tablet Take 40 mg by mouth daily. 05/17/19   [provider]  docusate sodium (COLACE) 100 MG capsule Take 100 mg by mouth at bedtime.     [provider]  ferrous sulfate 325 (65 FE) MG tablet Take 325 mg by mouth daily.    [provider]  furosemide (LASIX) 20 MG tablet Take 20 mg by mouth.    [provider]  gabapentin (NEURONTIN) 300 MG capsule Take 300 mg by mouth 3 (three) times daily.  04/11/19   [provider]  ipratropium-albuterol (DUONEB) 0.5-2.5 (3) MG/3ML SOLN Inhale 3 mLs into the lungs every 6 (six) hours as needed. Take 3 mLs by nebulization every 6 (six) hours as needed for Wheezing. Patient taking differently: Inhale 3 mLs into the lungs every 6 (six) hours as needed (for wheezing).  07/01/13   Ghimire, Henreitta Leber, MD  levofloxacin (LEVAQUIN) 500 MG tablet Take 500 mg by mouth daily. 7 day course prescribed on  05/16/2019 05/16/19   [provider]  levothyroxine (SYNTHROID, LEVOTHROID) 25 MCG tablet Take 25 mcg by mouth daily before breakfast.    [provider]  lipase/protease/amylase (CREON) 12000 units CPEP capsule Take 12,000 Units by mouth 3 (three) times daily before meals.     [provider]  lisinopril (PRINIVIL,ZESTRIL) 2.5 MG tablet Take 2.5 mg by mouth daily.  01/18/18   [provider]  metFORMIN (GLUCOPHAGE) 500 MG tablet Take 1,000 mg by mouth 2 (two) times daily with a meal.  07/01/13   Ghimire, Henreitta Leber, MD  methocarbamol (ROBAXIN) 500 MG tablet Take 500 mg by mouth 3 (three) times daily.    [provider]  oxyCODONE (ROXICODONE) 15 MG immediate  release tablet Take 15 mg by mouth 4 (four) times daily as needed. 05/22/19   [provider]  oxycodone (ROXICODONE) 30 MG immediate release tablet Take 30 mg by mouth 3 (three) times daily.     [provider]  OXYGEN Inhale 3 L into the lungs daily as needed (for shortness of breath).    [provider]  Pancrelipase, Lip-Prot-Amyl, (ZENPEP) 20000 UNITS CPEP Take 2-3 capsules by mouth 5 (five) times daily. 3 capsules three times daily with each meal and 2 capsules with each snack.    [provider]  pantoprazole (PROTONIX) 40 MG tablet Take 40 mg by mouth 2 (two) times daily.    [provider]  polyethylene glycol powder (GLYCOLAX/MIRALAX) powder Take 17 g by mouth 2 (two) times daily. Until daily soft stools  OTC 03/05/15   Nona Dell, PA-C  promethazine (PHENERGAN) 25 MG tablet Take 25 mg by mouth every 6 (six) hours as needed for nausea or vomiting.    [provider]  SPIRIVA HANDIHALER 18 MCG inhalation capsule Place 18 mcg into inhaler and inhale daily.  04/11/19   [provider]  valACYclovir (VALTREX) 500 MG tablet Take 500 mg by mouth daily.    [provider]  Diona Fanti [id number] units CPEP  05/08/19    [provider]    Family History Family History  Problem Relation Age of Onset  . Asthma Other   . Asthma Paternal Grandfather   . Heart attack Paternal Grandfather   . Hypertension Mother   . Cancer Maternal Grandmother   . Diabetes Paternal Grandmother   . Coronary artery disease Neg Hx   . Colon cancer Neg Hx     Social History Social History   Tobacco Use  . Smoking status: Former Research scientist (life sciences)  . Smokeless tobacco: Never Used  Substance Use Topics  . Alcohol use: No  . Drug use: No     Allergies   Penicillins, Sulfa antibiotics, Aspirin, Lovenox  [enoxaparin sodium], and Prednisone   Review of Systems Review of Systems  Constitutional: Positive for appetite change.  HENT: Negative for congestion.   Respiratory: Positive for cough, shortness of breath and wheezing.   Cardiovascular: Negative for chest pain.  Gastrointestinal: Negative for abdominal pain.  Musculoskeletal: Negative for back pain.  Skin: Negative for rash.  Neurological: Negative for weakness.     Physical Exam Updated Vital Signs BP 111/64   Pulse (!) 110   Temp 98.3 F (36.8 C) (Oral)   Resp (!) 23   Ht 5\' 2"  (1.575 m)   Wt 54 kg   SpO2 (!) 89%   BMI 21.77 kg/m   Physical Exam Vitals signs and nursing note reviewed.  Constitutional:      Appearance: [name] is well-developed.  HENT:     Head: Atraumatic.  Eyes:     Pupils: Pupils are equal, round, and reactive to light.  Neck:     Musculoskeletal: Neck supple.  Cardiovascular:     Rate and Rhythm: Tachycardia present.  Pulmonary:     Comments: Tachypnea.  Prolonged expirations.  Diffuse harsh wheezes. Chest:     Chest wall: No mass.  Abdominal:     Tenderness: There is no abdominal tenderness.  Musculoskeletal:     Right lower leg: [name] exhibits no tenderness. No edema.     Left lower leg: [name] exhibits no tenderness. No edema.  Skin:    General: Skin is warm.  Neurological:     Mental  Status: [name] is oriented to  person, place, and time.      ED Treatments / Results  Labs (all labs ordered are listed, but only abnormal results are displayed) Labs Reviewed  COMPREHENSIVE METABOLIC PANEL - Abnormal; Notable for the following components:      Result Value   Glucose, Bld 205 (*)    BUN 23 (*)    Calcium 8.4 (*)    Total Protein 6.2 (*)    Albumin 3.1 (*)    All other components within normal limits  CBC WITH DIFFERENTIAL/PLATELET - Abnormal; Notable for the following components:   WBC 12.1 (*)    RBC 2.67 (*)    Hemoglobin 6.9 (*)    HCT 23.5 (*)    MCH 25.8 (*)    MCHC 29.4 (*)    RDW 24.7 (*)    Neutro Abs 9.0 (*)    Eosinophils Absolute 1.0 (*)    All other components within normal limits  SARS CORONAVIRUS 2 (TAT 6-24 HRS)  PREPARE RBC (CROSSMATCH)    EKG EKG Interpretation  Date/Time:  Wednesday May 24 2019 14:57:38 EDT Ventricular Rate:  114 PR Interval:  130 QRS Duration: 100 QT Interval:  358 QTC Calculation: 493 R Axis:   116 Text Interpretation: Sinus tachycardia Incomplete right bundle branch block Right ventricular hypertrophy with repolarization abnormality Abnormal ECG since last tracing no significant change Confirmed by Daleen Bo [phone]) on 05/24/2019 3:01:59 PM   Radiology Dg Chest Portable 1 View  Result Date: 05/24/2019 CLINICAL DATA:  Shortness of breath, pneumonia since summer, recent hospitalization EXAM: PORTABLE CHEST 1 VIEW COMPARISON:  Radiograph 05/06/2019 FINDINGS: Bandlike areas of opacities in the mid lungs may reflect subsegmental atelectasis. There are chronic bronchitic changes throughout the lungs with diffuse airways thickening. Some patchy suprahilar and apical opacities are present. No pneumothorax or effusion. Cardiomediastinal contours are stable from prior including a calcified aorta. No acute osseous or soft tissue abnormality. IMPRESSION: 1. Bandlike areas of opacities in the mid lungs may reflect subsegmental atelectasis. 2. Some  patchy suprahilar and apical opacities are present with airways thickening and bronchitic features could reflect ongoing infection or inflammation. Electronically Signed   By: Lovena Le M.D.   On: 05/24/2019 16:58    Procedures Procedures (including critical care time)  Medications Ordered in ED Medications  albuterol (VENTOLIN HFA) 108 (90 Base) MCG/ACT inhaler 4 puff (has no administration in time range)  methylPREDNISolone sodium succinate (SOLU-MEDROL) 125 mg/2 mL injection 125 mg (has no administration in time range)  0.9 %  sodium chloride infusion (has no administration in time range)     Initial Impression / Assessment and Plan / ED Course  I have reviewed the triage vital signs and the nursing notes.  Pertinent labs & imaging results that were available during my care of the patient were reviewed by me and considered in my medical decision making (see chart for details).        Patient presents with shortness of breath.  Recent admission for pneumonia and COPD.  X-ray showed potential continued pneumonia.  However also found to have recurrent anemia.  Will transfuse.  He is hypoxic even on some oxygen.  Likely component of the anemia causing the hypoxia.  Will transfuse.  Will admit to hospitalist.  CRITICAL CARE Performed by: Davonna Belling Total critical care time:30 minutes Critical care time was exclusive of separately billable procedures and treating other patients. Critical care was necessary to treat or prevent imminent or life-threatening deterioration.  Critical care was time spent personally by me on the following activities: development of treatment plan with patient and/or surrogate as well as nursing, discussions with consultants, evaluation of patient's response to treatment, examination of patient, obtaining history from patient or surrogate, ordering and performing treatments and interventions, ordering and review of laboratory studies, ordering and review  of radiographic studies, pulse oximetry and re-evaluation of patient's condition.  Kaliyan P Milewski was evaluated in Emergency Department on 05/24/2019 for the symptoms described in the history of present illness. [name] was evaluated in the context of the global COVID-19 pandemic, which necessitated consideration that the patient might be at risk for infection with the SARS-CoV-2 virus that causes COVID-19. Institutional protocols and algorithms that pertain to the evaluation of patients at risk for COVID-19 are in a state of rapid change based on information released by regulatory bodies including the CDC and federal and state organizations. These policies and algorithms were followed during the patient's care in the ED.    Final Clinical Impressions(s) / ED Diagnoses   Final diagnoses:  Symptomatic anemia  COPD exacerbation [facility])    ED Discharge Orders    None       Davonna Belling, MD 05/24/19 [phone]

## 2019-05-24 NOTE — ED Notes (Signed)
Pt placed on purewick 

## 2019-05-25 ENCOUNTER — Encounter (HOSPITAL_COMMUNITY): Payer: Self-pay | Admitting: Gastroenterology

## 2019-05-25 DIAGNOSIS — E1142 Type 2 diabetes mellitus with diabetic polyneuropathy: Secondary | ICD-10-CM

## 2019-05-25 DIAGNOSIS — D500 Iron deficiency anemia secondary to blood loss (chronic): Secondary | ICD-10-CM | POA: Diagnosis not present

## 2019-05-25 DIAGNOSIS — I10 Essential (primary) hypertension: Secondary | ICD-10-CM

## 2019-05-25 DIAGNOSIS — E538 Deficiency of other specified B group vitamins: Secondary | ICD-10-CM | POA: Diagnosis present

## 2019-05-25 DIAGNOSIS — K861 Other chronic pancreatitis: Secondary | ICD-10-CM | POA: Diagnosis not present

## 2019-05-25 DIAGNOSIS — J189 Pneumonia, unspecified organism: Secondary | ICD-10-CM

## 2019-05-25 LAB — BASIC METABOLIC PANEL
Anion gap: 11 (ref 5–15)
BUN: 13 mg/dL (ref 6–20)
CO2: 22 mmol/L (ref 22–32)
Calcium: 8.5 mg/dL — ABNORMAL LOW (ref 8.9–10.3)
Chloride: 104 mmol/L (ref 98–111)
Creatinine, Ser: 0.58 mg/dL (ref 0.44–1.00)
GFR calc Af Amer: >60 mL/min (ref >60)
GFR calc non Af Amer: >60 mL/min (ref >60)
Glucose, Bld: 223 mg/dL — ABNORMAL HIGH (ref 70–99)
Potassium: 3.9 mmol/L (ref 3.5–5.1)
Sodium: 137 mmol/L (ref 135–145)

## 2019-05-25 LAB — HEMOGLOBIN A1C
Hgb A1c MFr Bld: 6.3 % — ABNORMAL HIGH (ref 4.8–5.6)
Mean Plasma Glucose: 134.11 mg/dL

## 2019-05-25 LAB — CBC
HCT: 32.3 % — ABNORMAL LOW (ref 36.0–46.0)
Hemoglobin: 10.1 g/dL — ABNORMAL LOW (ref 12.0–15.0)
MCH: 27.9 pg (ref 26.0–34.0)
MCHC: 31.3 g/dL (ref 30.0–36.0)
MCV: 89.2 fL (ref 80.0–100.0)
Platelets: 194 10*3/uL (ref 150–400)
RBC: 3.62 MIL/uL — ABNORMAL LOW (ref 3.87–5.11)
RDW: 20.4 % — ABNORMAL HIGH (ref 11.5–15.5)
WBC: 8.8 10*3/uL (ref 4.0–10.5)
nRBC: 0.2 % (ref 0.0–0.2)

## 2019-05-25 LAB — SARS CORONAVIRUS 2 (TAT 6-24 HRS): SARS Coronavirus 2: NEGATIVE

## 2019-05-25 LAB — GLUCOSE, CAPILLARY
Glucose-Capillary: 178 mg/dL — ABNORMAL HIGH (ref 70–99)
Glucose-Capillary: 290 mg/dL — ABNORMAL HIGH (ref 70–99)
Glucose-Capillary: 112 mg/dL — ABNORMAL HIGH (ref 70–99)
Glucose-Capillary: 158 mg/dL — ABNORMAL HIGH (ref 70–99)

## 2019-05-25 MED ORDER — BOOST / RESOURCE BREEZE PO LIQD CUSTOM
1.0000 | Freq: Three times a day (TID) | ORAL | Status: DC
  Administered 2019-05-25 – 2019-05-30 (×6): 1 via ORAL

## 2019-05-25 MED ORDER — DOXYCYCLINE HYCLATE 100 MG PO TABS
100.0000 mg | ORAL_TABLET | Freq: Two times a day (BID) | ORAL | Status: DC
  Administered 2019-05-25 – 2019-05-30 (×12): 100 mg via ORAL
  Filled 2019-05-25 (×11): qty 1

## 2019-05-25 MED ORDER — SALINE SPRAY 0.65 % NA SOLN
1.0000 | NASAL | Status: DC | PRN
  Administered 2019-05-25: 1 via NASAL
  Filled 2019-05-25: qty 44

## 2019-05-25 MED ORDER — ADULT MULTIVITAMIN W/MINERALS CH
1.0000 | ORAL_TABLET | Freq: Every day | ORAL | Status: DC
  Administered 2019-05-25 – 2019-05-30 (×6): 1 via ORAL
  Filled 2019-05-25 (×6): qty 1

## 2019-05-25 MED ORDER — PANCRELIPASE (LIP-PROT-AMYL) 12000-38000 UNITS PO CPEP
36000.0000 [IU] | ORAL_CAPSULE | Freq: Three times a day (TID) | ORAL | Status: DC
  Administered 2019-05-25 – 2019-05-30 (×16): 36000 [IU] via ORAL
  Filled 2019-05-25 (×17): qty 3

## 2019-05-25 MED ORDER — PANTOPRAZOLE SODIUM 40 MG IV SOLR
40.0000 mg | INTRAVENOUS | Status: DC
  Administered 2019-05-25: 40 mg via INTRAVENOUS
  Filled 2019-05-25: qty 40

## 2019-05-25 MED ORDER — ALUM & MAG HYDROXIDE-SIMETH 200-200-20 MG/5ML PO SUSP
30.0000 mL | Freq: Four times a day (QID) | ORAL | Status: DC | PRN
  Administered 2019-05-25 – 2019-05-29 (×3): 30 mL via ORAL
  Filled 2019-05-25 (×3): qty 30

## 2019-05-25 MED ORDER — CYANOCOBALAMIN 1000 MCG/ML IJ SOLN
1000.0000 ug | INTRAMUSCULAR | Status: DC
  Administered 2019-05-25: 1000 ug via INTRAMUSCULAR
  Filled 2019-05-25: qty 1

## 2019-05-25 MED ORDER — ALBUTEROL SULFATE (2.5 MG/3ML) 0.083% IN NEBU
2.5000 mg | INHALATION_SOLUTION | RESPIRATORY_TRACT | Status: DC | PRN
  Administered 2019-05-25 – 2019-05-26 (×2): 2.5 mg via RESPIRATORY_TRACT
  Filled 2019-05-25 (×3): qty 3

## 2019-05-25 MED ORDER — LORAZEPAM 2 MG/ML IJ SOLN
0.5000 mg | Freq: Two times a day (BID) | INTRAMUSCULAR | Status: DC | PRN
  Administered 2019-05-25 – 2019-05-31 (×10): 0.5 mg via INTRAVENOUS
  Filled 2019-05-25 (×11): qty 1

## 2019-05-25 MED ORDER — PANCRELIPASE (LIP-PROT-AMYL) 12000-38000 UNITS PO CPEP
24000.0000 [IU] | ORAL_CAPSULE | ORAL | Status: DC
  Administered 2019-05-26 – 2019-05-29 (×6): 24000 [IU] via ORAL
  Filled 2019-05-25 (×5): qty 2

## 2019-05-25 NOTE — Progress Notes (Signed)
Initial Nutrition Assessment  DOCUMENTATION CODES:   Not applicable  INTERVENTION:  Boost Breeze po TID, each supplement provides 250 kcal and 9 grams of protein MVI Monitor for diet advancement per GI recommendations  NUTRITION DIAGNOSIS:   Inadequate oral intake related to altered GI function as evidenced by other (comment)(CL diet insufficent to meet needs).  GOAL:   Patient will meet greater than or equal to 90% of their needs  MONITOR:   PO intake, Labs, I & O's, Supplement acceptance, Weight trends, Diet advancement  REASON FOR ASSESSMENT:   Malnutrition Screening Tool    ASSESSMENT:  RD working remotely.  48 year old female with medical history significant for HTN, HLD, T2DM, GERD, asthma, COPD on 3L intermittent home O2, recent admission secondary to asthma exacerbation; anemia s/p endoscopy and PRBC transfusion who presents to ED with complaints of worsening dyspnea over the past week. CXR showed significant opacities questionable for pneumonia and noted with Hgb 6.9 in ED.  Patient admitted with COPD exacerbation; chronic blood loss anemia s/p 2 units PRBC in ED  10/13 EGD showed large hiatal hernia with erythema and edema in the pouch, mild gastritis.  Per chart review, heme-negative stool yesterday, no overt GI bleeding noted; consideration of upper endoscopy with small bowel capsule study.  Patient on clear liquid diet, no recorded meals at this time. Will provide Boost Breeze nutrition supplement to aid with calorie/protein needs and continue to monitor for diet advancement per GI recommendations.   Medications reviewed and include: gabapentin, SSI, creon, methylprednisolone, protonix, valtrex, zithromax, rocephin  Labs: CBGS 178-290 Hgb 10.1 - trending up s/p 2 units PRBC  UBW 120-122 lb  NUTRITION - FOCUSED PHYSICAL EXAM: Unable to complete at this time; RD working remotely. 10/12 NFPE findings: No fat depletion, no muscle depletion; moderate BLE  edema   Diet Order:   Diet Order            Diet clear liquid Room service appropriate? Yes; Fluid consistency: Thin  Diet effective now              EDUCATION NEEDS:   No education needs have been identified at this time  Skin:  Skin Assessment: Reviewed RN Assessment  Last BM:  PTA  Height:   Ht Readings from Last 1 Encounters:  05/25/19 5\' 2"  (1.575 m)    Weight:   Wt Readings from Last 1 Encounters:  05/25/19 55.1 kg    Ideal Body Weight:  50 kg  BMI:  Body mass index is 22.22 kg/m.  Estimated Nutritional Needs:   Kcal:  1600-1800  Protein:  80-90  Fluid:  >/= 1.6 L/day  Lajuan Lines, RD, LDN Clinical Nutrition [place] Telephone [phone] After Hours/Weekend Pager: [phone]

## 2019-05-25 NOTE — Consult Note (Addendum)
Referring Provider: Murlean Iba, MD Primary Care Physician:  Redmond School, MD Primary Gastroenterologist:  Dr. Steward Drone  Reason for Consultation: Anemia, questionable capsule study needed  HPI: [name] is a 48 y.o. female with history of COPD/asthma on oxygen at home, hypertension, diabetes, longstanding history of IDA with recent hospitalization a couple of weeks ago presenting for worsening shortness of breath, DOE, productive phlegm.  In the emergency department she was noted to have a hemoglobin of 6.9.  Chest x-ray with opacities concerning for pneumonia.  She was started on Rocephin and Solu-Medrol.  When she was seen a couple of weeks ago during admission, there was was concern about possible melena.  EGD on October 13 showed large hiatal hernia with erythema and edema in the pouch, mild gastritis.  Last admission her hemoglobin was 8.5 on 10/10, dropped down to 6.6 on 10/11. She was transfused and at time of discharge her hemoglobin was 8.2 on October 13.  She presented back yesterday with a hemoglobin of 6.9.  Her hemoglobin today is 10.1 after 2 units of packed red blood cells.Couple of weeks ago her iron was 16, TIBC 378, iron sats 4%, ferritin 6.This admission her BUN was minimally elevated at 23.  Creatinine 0.1 she was heme-negative yesterday.     Work-up here locally for IDA in 2014 both in [place] and with Dr. Britta Mccreedy.  EGD with LA grade a esophagitis.  Colonoscopy unremarkable.  Capsule study demonstrated only a couple of small bowel erosions.  Colonoscopy August 2018 by Dr. Newman Pies was INCOMPLETE due to poor prep.  She has a history of multiple EUS's for chronic pancreatitis and celiac blocks, last one in June, LA grade B esophagitis, antritis with benign biopsy/no H. pylori, 1 cm ulcer noted at the duodenal sweep.  Patient states she presents emergency department because of congestion and shortness of breath.  She has been tasting blood after coughing  since the summer.  No hematemesis.  Denies any melena or bright red blood per rectum.  Denies any abdominal pain.  She had heartburn for a couple of days.  No dysphagia.  Breathing feels better this morning.  At home she is on 3 L of oxygen nasal cannula.  Prior to Admission medications   Medication Sig Start Date End Date Taking? Authorizing Provider  albuterol (PROVENTIL HFA;VENTOLIN HFA) 108 (90 BASE) MCG/ACT inhaler Inhale 2 puffs into the lungs daily as needed for wheezing. For shortness of breath   Yes [provider]  albuterol (PROVENTIL) (2.5 MG/3ML) 0.083% nebulizer solution Take 2.5 mg by nebulization every 6 (six) hours as needed for wheezing or shortness of breath.  05/24/19  Yes [provider]  amitriptyline (ELAVIL) 100 MG tablet Take 100 mg by mouth at bedtime.   Yes [provider]  atorvastatin (LIPITOR) 20 MG tablet Take 20 mg by mouth daily.   Yes [provider]  budesonide-formoterol (SYMBICORT) 80-4.5 MCG/ACT inhaler Inhale 2 puffs into the lungs 2 (two) times daily. 03/30/14  Yes Samuella Cota, MD  busPIRone (BUSPAR) 10 MG tablet Take 10 mg by mouth 3 (three) times daily.   Yes [provider]  citalopram (CELEXA) 40 MG tablet Take 40 mg by mouth daily. 05/17/19  Yes [provider]  docusate sodium (COLACE) 100 MG capsule Take 100 mg by mouth at bedtime.    Yes [provider]  ferrous sulfate 325 (65 FE) MG tablet Take 325 mg by mouth daily.   Yes [provider]  furosemide (LASIX) 20 MG tablet Take 20 mg by mouth daily.    Yes [provider]  gabapentin (NEURONTIN) 300 MG capsule Take 300 mg by mouth 3 (three) times daily.  04/11/19  Yes [provider]  levothyroxine (SYNTHROID, LEVOTHROID) 25 MCG tablet Take 25 mcg by mouth daily before breakfast.   Yes [provider]        [provider]  lisinopril (PRINIVIL,ZESTRIL) 2.5 MG tablet Take 2.5 mg by mouth  daily.  01/18/18  Yes [provider]  metFORMIN (GLUCOPHAGE) 500 MG tablet Take 1,000 mg by mouth 2 (two) times daily with a meal.  07/01/13  Yes Ghimire, Henreitta Leber, MD  methocarbamol (ROBAXIN) 500 MG tablet Take 500 mg by mouth 3 (three) times daily.   Yes [provider]  oxyCODONE (ROXICODONE) 15 MG immediate release tablet Take 15 mg by mouth 4 (four) times daily.  05/22/19  Yes [provider]  OXYGEN Inhale 3 L into the lungs daily as needed (for shortness of breath).   Yes [provider]  pantoprazole (PROTONIX) 40 MG tablet Take 40 mg by mouth 2 (two) times daily.   Yes [provider]  polyethylene glycol powder (GLYCOLAX/MIRALAX) powder Take 17 g by mouth 2 (two) times daily. Until daily soft stools  OTC 03/05/15  Yes Nona Dell, PA-C  promethazine (PHENERGAN) 25 MG tablet Take 25 mg by mouth every 6 (six) hours as needed for nausea or vomiting.   Yes [provider]  SPIRIVA HANDIHALER 18 MCG inhalation capsule Place 18 mcg into inhaler and inhale daily.  04/11/19  Yes [provider]  valACYclovir (VALTREX) 500 MG tablet Take 500 mg by mouth daily.   Yes [provider]  ZENPEP 20000-63000 units CPEP Take 1 capsule by mouth 3 (three) times daily after meals.  05/08/19  Yes [provider]  levofloxacin (LEVAQUIN) 500 MG tablet Take 500 mg by mouth daily. 7 day course prescribed on 05/16/2019 05/16/19   [provider]    Current Facility-Administered Medications  Medication Dose Route Frequency Provider Last Rate Last Dose  . albuterol (PROVENTIL) (2.5 MG/3ML) 0.083% nebulizer solution 2.5 mg  2.5 mg Nebulization Q2H PRN Johnson, Clanford L, MD      . atorvastatin (LIPITOR) tablet 20 mg  20 mg Oral Daily Elgergawy, Silver Huguenin, MD      . azithromycin (ZITHROMAX) 500 mg in sodium chloride 0.9 % 250 mL IVPB  500 mg Intravenous Q24H Elgergawy, Silver Huguenin, MD   Stopped at 05/25/19 0043  .  cefTRIAXone (ROCEPHIN) 2 g in sodium chloride 0.9 % 100 mL IVPB  2 g Intravenous Q24H Davonna Belling, MD   Stopped at 05/24/19 2052  . fluticasone furoate-vilanterol (BREO ELLIPTA) 100-25 MCG/INH 1 puff  1 puff Inhalation Daily Elgergawy, Dawood S, MD      . gabapentin (NEURONTIN) capsule 300 mg  300 mg Oral TID Elgergawy, Silver Huguenin, MD   300 mg at 05/24/19 2228  . insulin aspart (novoLOG) injection 0-15 Units  0-15 Units Subcutaneous TID WC Elgergawy, Silver Huguenin, MD   3 Units at 05/25/19 0850  . insulin aspart (novoLOG) injection 0-5 Units  0-5 Units Subcutaneous QHS Elgergawy, Silver Huguenin, MD   2 Units at 05/24/19 2228  . levothyroxine (SYNTHROID) tablet 25 mcg  25 mcg Oral QAC breakfast Elgergawy, Silver Huguenin, MD   25 mcg at 05/25/19 0848  . lipase/protease/amylase (CREON) capsule 24,000 Units  24,000 Units Oral With snacks Mahala Menghini,  PA-C      . lipase/protease/amylase (CREON) capsule 36,000 Units  36,000 Units Oral TID WC Mahala Menghini, PA-C      . lisinopril (ZESTRIL) tablet 2.5 mg  2.5 mg Oral Daily Elgergawy, Silver Huguenin, MD      . LORazepam (ATIVAN) injection 0.5 mg  0.5 mg Intravenous BID PRN Jani Gravel, MD   0.5 mg at 05/25/19 0425  . methylPREDNISolone sodium succinate (SOLU-MEDROL) 40 mg/mL injection 40 mg  40 mg Intravenous Q12H Elgergawy, Silver Huguenin, MD   40 mg at 05/25/19 0848  . ondansetron (ZOFRAN) tablet 4 mg  4 mg Oral Q6H PRN Elgergawy, Silver Huguenin, MD       Or  . ondansetron (ZOFRAN) injection 4 mg  4 mg Intravenous Q6H PRN Elgergawy, Silver Huguenin, MD      . oxyCODONE (Oxy IR/ROXICODONE) immediate release tablet 15 mg  15 mg Oral Q6H PRN Elgergawy, Silver Huguenin, MD   15 mg at 05/25/19 0848  . pantoprazole (PROTONIX) injection 40 mg  40 mg Intravenous Q24H Johnson, Clanford L, MD      . umeclidinium bromide (INCRUSE ELLIPTA) 62.5 MCG/INH 1 puff  1 puff Inhalation Daily Elgergawy, Silver Huguenin, MD      . valACYclovir (VALTREX) tablet 500 mg  500 mg Oral Daily Elgergawy, Silver Huguenin, MD         Allergies as of 05/24/2019 - Review Complete 05/24/2019  Allergen Reaction Noted  . Penicillins Shortness Of Breath   . Sulfa antibiotics Shortness Of Breath 04/21/2011  . Aspirin Nausea And Vomiting 03/05/2015  . Lovenox  [enoxaparin sodium] Other (See Comments) 03/05/2015  . Prednisone Other (See Comments) 07/14/2011    Past Medical History:  Diagnosis Date  . Anemia   . Anxiety   . Asthma   . Bipolar 1 disorder ([place])   . Chronic abdominal pain   . COPD (chronic obstructive pulmonary disease) ([place])   . Depression   . Diabetes mellitus   . Diabetic neuropathy ([place]) 10/29/2017  . Esophagitis   . Hiatal hernia   . Hyperlipidemia 02/03/2012  . Migraine   . Neck pain 06/08/2012  . Pancreatitis chronic Idiopathic   T[facility] by [facility] Pies [facility] in the past with celiac blocks.    Past Surgical History:  Procedure Laterality Date  . ABDOMINAL HYSTERECTOMY    . attempted colonoscopy  02/2017   Dr. Newman Pies: Poor prep, scope passed to mid transverse colon before colonoscopy aborted.  No significant findings noted to mid transverse colon.  Next colonoscopy in 2 years.  . CELIAC PLEXUS BLOCK    . CHOLECYSTECTOMY    . COLONOSCOPY N/A 12/05/2012   [id number] rectum, colon and terminal ileum  . ESOPHAGOGASTRODUODENOSCOPY  02/20/2008   [id number] distal esophageal mucosa, suspicious for neoplasm/Hiatal hernia otherwise normal   . ESOPHAGOGASTRODUODENOSCOPY  04/03/2008   [id number] hiatal hernia, otherwise normal/Short, tight, benign-appearing peptic stricture  . ESOPHAGOGASTRODUODENOSCOPY  November 16, 2012   Dr. Britta Mccreedy: hiatal hernia, esophagitis,   . ESOPHAGOGASTRODUODENOSCOPY (EGD) WITH PROPOFOL N/A 05/09/2019   Dr. Oneida Alar: Large hiatal hernia with erythema and edema in the pouch, mild gastritis.  No biopsies taken.  . EUS  12/2018   Dr. Newman Pies: LA grade B esophagitis, antritis but negative H. pylori, 1 cm ulcer at the duodenal sweep  . GIVENS CAPSULE STUDY  N/A 12/05/2012   Few superficial erosions but nothing found to explain iron deficiency anemia.   . Ileocolonoscopy  06/05/2008     [id number] anal canal, otherwise  normal rectum, colon  . TONSILLECTOMY      Family History  Problem Relation Age of Onset  . Asthma Other   . Asthma Paternal Grandfather   . Heart attack Paternal Grandfather   . Hypertension Mother   . Cancer Maternal Grandmother   . Diabetes Paternal Grandmother   . Coronary artery disease Neg Hx   . Colon cancer Neg Hx     Social History   Socioeconomic History  . Marital status: Divorced    Spouse name: Not on file  . Number of children: Not on file  . Years of education: Not on file  . Highest education level: Some college, no degree  Occupational History  . Occupation: disabled  Social Needs  . Financial resource strain: Not hard at all  . Food insecurity    Worry: Never true    Inability: Never true  . Transportation needs    Medical: No    Non-medical: No  Tobacco Use  . Smoking status: Former Research scientist (life sciences)  . Smokeless tobacco: Never Used  Substance and Sexual Activity  . Alcohol use: No  . Drug use: No  . Sexual activity: Yes    Birth control/protection: Surgical  Lifestyle  . Physical activity    Days per week: Not on file    Minutes per session: Not on file  . Stress: Not on file  Relationships  . Social Herbalist on phone: Not on file    Gets together: Not on file    Attends religious service: Not on file    Active member of club or organization: Not on file    Attends meetings of clubs or organizations: Not on file    Relationship status: Not on file  . Intimate partner violence    Fear of current or ex partner: Not on file    Emotionally abused: Not on file    Physically abused: Not on file    Forced sexual activity: Not on file  Other Topics Concern  . Not on file  Social History Narrative   ** Merged History Encounter **       She is on disability for pancreatitis  since 2014.  She was previously working as a Presenter, broadcasting in 2004. She lives with her husband in a two level home.      ROS:  General: Negative for anorexia, weight loss, fever, chills, fatigue, weakness. Eyes: Negative for vision changes.  ENT: Negative for hoarseness, difficulty swallowing , nasal congestion. CV: Negative for chest pain, angina, palpitations, positive dyspnea on exertion, peripheral edema.  Respiratory: Negative for dyspnea at rest, positive dyspnea on exertion, positive cough, positive green sputum, wheezing.  GI: See history of present illness. GU:  Negative for dysuria, hematuria, urinary incontinence, urinary frequency, nocturnal urination.  MS: Negative for joint pain, low back pain.  Derm: Negative for rash or itching.  Neuro: Negative for weakness, abnormal sensation, seizure, frequent headaches, memory loss, confusion.  Psych: Negative for anxiety, depression, suicidal ideation, hallucinations.  Endo: Negative for unusual weight change.  Heme: Negative for bruising or bleeding. Allergy: Negative for rash or hives.       Physical Examination: Vital signs in last 24 hours: Temp:  [97.8 F (36.6 C)-98.8 F (37.1 C)] 98 F (36.7 C) (10/29 0649) Pulse Rate:  [83-116] 96 (10/29 0649) Resp:  [9-23] 16 (10/29 0649) BP: (86-129)/(44-81) 106/76 (10/29 0649) SpO2:  [84 %-100 %] 94 % (10/29 0649) Weight:  [54 kg-55.1 kg] 55.1 kg (  10/29 0649)    General: Well-nourished, well-developed in no acute distress.  Appears older than stated age.  Nasal cannula, 5 L without evidence of shortness of breath. Head: Normocephalic, atraumatic.   Eyes: Conjunctiva pink, no icterus. Mouth: Oropharyngeal mucosa moist and pink , no lesions erythema or exudate. Neck: Supple without thyromegaly, masses, or lymphadenopathy.  Lungs: Scattered wheezes in the bases Heart: Regular rate and rhythm, no murmurs rubs or gallops.  Abdomen: Bowel sounds are normal, nontender, nondistended,  no hepatosplenomegaly or masses, no abdominal bruits or    hernia , no rebound or guarding.   Rectal: Not performed Extremities: No lower extremity edema, clubbing, deformity.  Neuro: Alert and oriented x 4 , grossly normal neurologically.  Skin: Warm and dry, no rash or jaundice.   Psych: Alert and cooperative, normal mood and affect.        Intake/Output from previous day: 10/28 0701 - 10/29 0700 In: 650 [I.V.:20; Blood:630] Out: -  Intake/Output this shift: No intake/output data recorded.  Lab Results: CBC Recent Labs    05/24/19 1618 05/25/19 0549  WBC 12.1* 8.8  HGB 6.9* 10.1*  HCT 23.5* 32.3*  MCV 88.0 89.2  PLT 242 194   BMET Recent Labs    05/24/19 1618 05/25/19 0549  NA 136 137  K 3.7 3.9  CL 105 104  CO2 23 22  GLUCOSE 205* 223*  BUN 23* 13  CREATININE 0.91 0.58  CALCIUM 8.4* 8.5*   LFT Recent Labs    05/24/19 1618  BILITOT 0.3  ALKPHOS 54  AST 19  ALT 15  PROT 6.2*  ALBUMIN 3.1*    Lipase No results for input(s): LIPASE in the last 72 hours.  PT/INR No results for input(s): LABPROT, INR in the last 72 hours.    Imaging Studies: Dg Chest Portable 1 View  Result Date: 05/24/2019 CLINICAL DATA:  Shortness of breath, pneumonia since summer, recent hospitalization EXAM: PORTABLE CHEST 1 VIEW COMPARISON:  Radiograph 05/06/2019 FINDINGS: Bandlike areas of opacities in the mid lungs may reflect subsegmental atelectasis. There are chronic bronchitic changes throughout the lungs with diffuse airways thickening. Some patchy suprahilar and apical opacities are present. No pneumothorax or effusion. Cardiomediastinal contours are stable from prior including a calcified aorta. No acute osseous or soft tissue abnormality. IMPRESSION: 1. Bandlike areas of opacities in the mid lungs may reflect subsegmental atelectasis. 2. Some patchy suprahilar and apical opacities are present with airways thickening and bronchitic features could reflect ongoing infection or  inflammation. Electronically Signed   By: Lovena Le M.D.   On: 05/24/2019 16:58   Dg Chest Port 1 View  Result Date: 05/06/2019 CLINICAL DATA:  Pt states she has asthma and has been battling pneumonia since a hospital admission in July. Pt seen PCP at [facility] Thursday has been on multiple rounds of antibiotics. Follow-up with PCP scheduled next week. EXAM: PORTABLE CHEST 1 VIEW COMPARISON:  Chest radiograph 02/03/2019, 04/28/2018 FINDINGS: The heart size and mediastinal contours are within normal limits. There are diffuse bilateral interstitial opacities. There are small focal opacities in the right base and left mid lung. No pneumothorax or large pleural effusion. No acute finding in the visualized skeleton. IMPRESSION: 1. Diffuse bilateral interstitial opacities could represent reactive airways disease, atypical infection, or edema. 2. Small focal opacities in the right base and left mid lung may reflect superimposed atelectasis versus infection. Electronically Signed   By: Audie Pinto M.D.   On: 05/06/2019 18:26  [4 week]   Impression: Pleasant  48 year old female with transfusion dependent anemia, second hospitalization this month.  Anemia panel consistent with IDA.  Notably last admission her B12 level was 142.  EGD last admission did not explain transfusion dependent anemia.  Reported duodenal ulcer at the duodenal sweep in June 2020, not seen on recent EGD.  Attempted colonoscopy in 2018, incomplete due to poor prep.  Last complete colonoscopy 2014 as outlined above.  Heme-negative stool yesterday.  No overt GI bleeding noted this admission.  Differential diagnosis includes occult GI bleeding from the small bowel or colon.  B12 deficiency: B12 level 142 earlier this month.  Chronic pancreatitis: Stable at this time.    Plan: 1. Management of B12 deficiency per attending 2. Consider upper endoscopy with small bowel capsule study as per previous plan.  To discuss with Dr.  Oneida Alar. 3. Dr. Newman Pies had plans for repeat colonoscopy this year, may need to be done sooner if no explanation for IDA. 4. Continue PPI twice daily. 5. Adjusted Creon, weight-based for admission.  It is not clear what dose of Zenpep she is on as an outpatient but patient tells me she takes twice daily after meals.  Instructed her to try taking at onset of meals 3 times daily and with snacks as well.  She can discuss further with Dr. Newman Pies at follow-up.  We would like to thank you for the opportunity to participate in the care of [name].  Laureen Ochs. Bernarda Caffey [facility] [phone] 10/29/20203:44 PM    LOS: 1 day

## 2019-05-25 NOTE — H&P (View-Only) (Signed)
Referring Provider: Murlean Iba, MD Primary Care Physician:  Redmond School, MD Primary Gastroenterologist:  Dr. Steward Drone  Reason for Consultation: Anemia, questionable capsule study needed  HPI: [name] is a 48 y.o. female with history of COPD/asthma on oxygen at home, hypertension, diabetes, longstanding history of IDA with recent hospitalization a couple of weeks ago presenting for worsening shortness of breath, DOE, productive phlegm.  In the emergency department she was noted to have a hemoglobin of 6.9.  Chest x-ray with opacities concerning for pneumonia.  She was started on Rocephin and Solu-Medrol.  When she was seen a couple of weeks ago during admission, there was was concern about possible melena.  EGD on October 13 showed large hiatal hernia with erythema and edema in the pouch, mild gastritis.  Last admission her hemoglobin was 8.5 on 10/10, dropped down to 6.6 on 10/11. She was transfused and at time of discharge her hemoglobin was 8.2 on October 13.  She presented back yesterday with a hemoglobin of 6.9.  Her hemoglobin today is 10.1 after 2 units of packed red blood cells.Couple of weeks ago her iron was 16, TIBC 378, iron sats 4%, ferritin 6.This admission her BUN was minimally elevated at 23.  Creatinine 0.1 she was heme-negative yesterday.     Work-up here locally for IDA in 2014 both in [place] and with Dr. Britta Mccreedy.  EGD with LA grade a esophagitis.  Colonoscopy unremarkable.  Capsule study demonstrated only a couple of small bowel erosions.  Colonoscopy August 2018 by Dr. Newman Pies was INCOMPLETE due to poor prep.  She has a history of multiple EUS's for chronic pancreatitis and celiac blocks, last one in June, LA grade B esophagitis, antritis with benign biopsy/no H. pylori, 1 cm ulcer noted at the duodenal sweep.  Patient states she presents emergency department because of congestion and shortness of breath.  She has been tasting blood after coughing  since the summer.  No hematemesis.  Denies any melena or bright red blood per rectum.  Denies any abdominal pain.  She had heartburn for a couple of days.  No dysphagia.  Breathing feels better this morning.  At home she is on 3 L of oxygen nasal cannula.  Prior to Admission medications   Medication Sig Start Date End Date Taking? Authorizing Provider  albuterol (PROVENTIL HFA;VENTOLIN HFA) 108 (90 BASE) MCG/ACT inhaler Inhale 2 puffs into the lungs daily as needed for wheezing. For shortness of breath   Yes [provider]  albuterol (PROVENTIL) (2.5 MG/3ML) 0.083% nebulizer solution Take 2.5 mg by nebulization every 6 (six) hours as needed for wheezing or shortness of breath.  05/24/19  Yes [provider]  amitriptyline (ELAVIL) 100 MG tablet Take 100 mg by mouth at bedtime.   Yes [provider]  atorvastatin (LIPITOR) 20 MG tablet Take 20 mg by mouth daily.   Yes [provider]  budesonide-formoterol (SYMBICORT) 80-4.5 MCG/ACT inhaler Inhale 2 puffs into the lungs 2 (two) times daily. 03/30/14  Yes Samuella Cota, MD  busPIRone (BUSPAR) 10 MG tablet Take 10 mg by mouth 3 (three) times daily.   Yes [provider]  citalopram (CELEXA) 40 MG tablet Take 40 mg by mouth daily. 05/17/19  Yes [provider]  docusate sodium (COLACE) 100 MG capsule Take 100 mg by mouth at bedtime.    Yes [provider]  ferrous sulfate 325 (65 FE) MG tablet Take 325 mg by mouth daily.   Yes [provider]  furosemide (LASIX) 20 MG tablet Take 20 mg by mouth daily.    Yes [provider]  gabapentin (NEURONTIN) 300 MG capsule Take 300 mg by mouth 3 (three) times daily.  04/11/19  Yes [provider]  levothyroxine (SYNTHROID, LEVOTHROID) 25 MCG tablet Take 25 mcg by mouth daily before breakfast.   Yes [provider]        [provider]  lisinopril (PRINIVIL,ZESTRIL) 2.5 MG tablet Take 2.5 mg by mouth  daily.  01/18/18  Yes [provider]  metFORMIN (GLUCOPHAGE) 500 MG tablet Take 1,000 mg by mouth 2 (two) times daily with a meal.  07/01/13  Yes Ghimire, Henreitta Leber, MD  methocarbamol (ROBAXIN) 500 MG tablet Take 500 mg by mouth 3 (three) times daily.   Yes [provider]  oxyCODONE (ROXICODONE) 15 MG immediate release tablet Take 15 mg by mouth 4 (four) times daily.  05/22/19  Yes [provider]  OXYGEN Inhale 3 L into the lungs daily as needed (for shortness of breath).   Yes [provider]  pantoprazole (PROTONIX) 40 MG tablet Take 40 mg by mouth 2 (two) times daily.   Yes [provider]  polyethylene glycol powder (GLYCOLAX/MIRALAX) powder Take 17 g by mouth 2 (two) times daily. Until daily soft stools  OTC 03/05/15  Yes Nona Dell, PA-C  promethazine (PHENERGAN) 25 MG tablet Take 25 mg by mouth every 6 (six) hours as needed for nausea or vomiting.   Yes [provider]  SPIRIVA HANDIHALER 18 MCG inhalation capsule Place 18 mcg into inhaler and inhale daily.  04/11/19  Yes [provider]  valACYclovir (VALTREX) 500 MG tablet Take 500 mg by mouth daily.   Yes [provider]  ZENPEP 20000-63000 units CPEP Take 1 capsule by mouth 3 (three) times daily after meals.  05/08/19  Yes [provider]  levofloxacin (LEVAQUIN) 500 MG tablet Take 500 mg by mouth daily. 7 day course prescribed on 05/16/2019 05/16/19   [provider]    Current Facility-Administered Medications  Medication Dose Route Frequency Provider Last Rate Last Dose  . albuterol (PROVENTIL) (2.5 MG/3ML) 0.083% nebulizer solution 2.5 mg  2.5 mg Nebulization Q2H PRN Johnson, Clanford L, MD      . atorvastatin (LIPITOR) tablet 20 mg  20 mg Oral Daily Elgergawy, Silver Huguenin, MD      . azithromycin (ZITHROMAX) 500 mg in sodium chloride 0.9 % 250 mL IVPB  500 mg Intravenous Q24H Elgergawy, Silver Huguenin, MD   Stopped at 05/25/19 0043  .  cefTRIAXone (ROCEPHIN) 2 g in sodium chloride 0.9 % 100 mL IVPB  2 g Intravenous Q24H Davonna Belling, MD   Stopped at 05/24/19 2052  . fluticasone furoate-vilanterol (BREO ELLIPTA) 100-25 MCG/INH 1 puff  1 puff Inhalation Daily Elgergawy, Dawood S, MD      . gabapentin (NEURONTIN) capsule 300 mg  300 mg Oral TID Elgergawy, Silver Huguenin, MD   300 mg at 05/24/19 2228  . insulin aspart (novoLOG) injection 0-15 Units  0-15 Units Subcutaneous TID WC Elgergawy, Silver Huguenin, MD   3 Units at 05/25/19 0850  . insulin aspart (novoLOG) injection 0-5 Units  0-5 Units Subcutaneous QHS Elgergawy, Silver Huguenin, MD   2 Units at 05/24/19 2228  . levothyroxine (SYNTHROID) tablet 25 mcg  25 mcg Oral QAC breakfast Elgergawy, Silver Huguenin, MD   25 mcg at 05/25/19 0848  . lipase/protease/amylase (CREON) capsule 24,000 Units  24,000 Units Oral With snacks Mahala Menghini,  PA-C      . lipase/protease/amylase (CREON) capsule 36,000 Units  36,000 Units Oral TID WC Mahala Menghini, PA-C      . lisinopril (ZESTRIL) tablet 2.5 mg  2.5 mg Oral Daily Elgergawy, Silver Huguenin, MD      . LORazepam (ATIVAN) injection 0.5 mg  0.5 mg Intravenous BID PRN Jani Gravel, MD   0.5 mg at 05/25/19 0425  . methylPREDNISolone sodium succinate (SOLU-MEDROL) 40 mg/mL injection 40 mg  40 mg Intravenous Q12H Elgergawy, Silver Huguenin, MD   40 mg at 05/25/19 0848  . ondansetron (ZOFRAN) tablet 4 mg  4 mg Oral Q6H PRN Elgergawy, Silver Huguenin, MD       Or  . ondansetron (ZOFRAN) injection 4 mg  4 mg Intravenous Q6H PRN Elgergawy, Silver Huguenin, MD      . oxyCODONE (Oxy IR/ROXICODONE) immediate release tablet 15 mg  15 mg Oral Q6H PRN Elgergawy, Silver Huguenin, MD   15 mg at 05/25/19 0848  . pantoprazole (PROTONIX) injection 40 mg  40 mg Intravenous Q24H Johnson, Clanford L, MD      . umeclidinium bromide (INCRUSE ELLIPTA) 62.5 MCG/INH 1 puff  1 puff Inhalation Daily Elgergawy, Silver Huguenin, MD      . valACYclovir (VALTREX) tablet 500 mg  500 mg Oral Daily Elgergawy, Silver Huguenin, MD         Allergies as of 05/24/2019 - Review Complete 05/24/2019  Allergen Reaction Noted  . Penicillins Shortness Of Breath   . Sulfa antibiotics Shortness Of Breath 04/21/2011  . Aspirin Nausea And Vomiting 03/05/2015  . Lovenox  [enoxaparin sodium] Other (See Comments) 03/05/2015  . Prednisone Other (See Comments) 07/14/2011    Past Medical History:  Diagnosis Date  . Anemia   . Anxiety   . Asthma   . Bipolar 1 disorder ([place])   . Chronic abdominal pain   . COPD (chronic obstructive pulmonary disease) ([place])   . Depression   . Diabetes mellitus   . Diabetic neuropathy ([place]) 10/29/2017  . Esophagitis   . Hiatal hernia   . Hyperlipidemia 02/03/2012  . Migraine   . Neck pain 06/08/2012  . Pancreatitis chronic Idiopathic   Treated by Dr. Newman Pies at [facility] in the past with celiac blocks.    Past Surgical History:  Procedure Laterality Date  . ABDOMINAL HYSTERECTOMY    . attempted colonoscopy  02/2017   Dr. Newman Pies: Poor prep, scope passed to mid transverse colon before colonoscopy aborted.  No significant findings noted to mid transverse colon.  Next colonoscopy in 2 years.  . CELIAC PLEXUS BLOCK    . CHOLECYSTECTOMY    . COLONOSCOPY N/A 12/05/2012   [id number] rectum, colon and terminal ileum  . ESOPHAGOGASTRODUODENOSCOPY  02/20/2008   [id number] distal esophageal mucosa, suspicious for neoplasm/Hiatal hernia otherwise normal   . ESOPHAGOGASTRODUODENOSCOPY  04/03/2008   [id number] hiatal hernia, otherwise normal/Short, tight, benign-appearing peptic stricture  . ESOPHAGOGASTRODUODENOSCOPY  November 16, 2012   Dr. Britta Mccreedy: hiatal hernia, esophagitis,   . ESOPHAGOGASTRODUODENOSCOPY (EGD) WITH PROPOFOL N/A 05/09/2019   Dr. Oneida Alar: Large hiatal hernia with erythema and edema in the pouch, mild gastritis.  No biopsies taken.  . EUS  12/2018   Dr. Newman Pies: LA grade B esophagitis, antritis but negative H. pylori, 1 cm ulcer at the duodenal sweep  . GIVENS CAPSULE STUDY  N/A 12/05/2012   Few superficial erosions but nothing found to explain iron deficiency anemia.   . Ileocolonoscopy  06/05/2008     [id number] anal canal, otherwise  normal rectum, colon  . TONSILLECTOMY      Family History  Problem Relation Age of Onset  . Asthma Other   . Asthma Paternal Grandfather   . Heart attack Paternal Grandfather   . Hypertension Mother   . Cancer Maternal Grandmother   . Diabetes Paternal Grandmother   . Coronary artery disease Neg Hx   . Colon cancer Neg Hx     Social History   Socioeconomic History  . Marital status: Divorced    Spouse name: Not on file  . Number of children: Not on file  . Years of education: Not on file  . Highest education level: Some college, no degree  Occupational History  . Occupation: disabled  Social Needs  . Financial resource strain: Not hard at all  . Food insecurity    Worry: Never true    Inability: Never true  . Transportation needs    Medical: No    Non-medical: No  Tobacco Use  . Smoking status: Former Research scientist (life sciences)  . Smokeless tobacco: Never Used  Substance and Sexual Activity  . Alcohol use: No  . Drug use: No  . Sexual activity: Yes    Birth control/protection: Surgical  Lifestyle  . Physical activity    Days per week: Not on file    Minutes per session: Not on file  . Stress: Not on file  Relationships  . Social Herbalist on phone: Not on file    Gets together: Not on file    Attends religious service: Not on file    Active member of club or organization: Not on file    Attends meetings of clubs or organizations: Not on file    Relationship status: Not on file  . Intimate partner violence    Fear of current or ex partner: Not on file    Emotionally abused: Not on file    Physically abused: Not on file    Forced sexual activity: Not on file  Other Topics Concern  . Not on file  Social History Narrative   ** Merged History Encounter **       She is on disability for pancreatitis  since 2014.  She was previously working as a Presenter, broadcasting in 2004. She lives with her husband in a two level home.      ROS:  General: Negative for anorexia, weight loss, fever, chills, fatigue, weakness. Eyes: Negative for vision changes.  ENT: Negative for hoarseness, difficulty swallowing , nasal congestion. CV: Negative for chest pain, angina, palpitations, positive dyspnea on exertion, peripheral edema.  Respiratory: Negative for dyspnea at rest, positive dyspnea on exertion, positive cough, positive green sputum, wheezing.  GI: See history of present illness. GU:  Negative for dysuria, hematuria, urinary incontinence, urinary frequency, nocturnal urination.  MS: Negative for joint pain, low back pain.  Derm: Negative for rash or itching.  Neuro: Negative for weakness, abnormal sensation, seizure, frequent headaches, memory loss, confusion.  Psych: Negative for anxiety, depression, suicidal ideation, hallucinations.  Endo: Negative for unusual weight change.  Heme: Negative for bruising or bleeding. Allergy: Negative for rash or hives.       Physical Examination: Vital signs in last 24 hours: Temp:  [97.8 F (36.6 C)-98.8 F (37.1 C)] 98 F (36.7 C) (10/29 0649) Pulse Rate:  [83-116] 96 (10/29 0649) Resp:  [9-23] 16 (10/29 0649) BP: (86-129)/(44-81) 106/76 (10/29 0649) SpO2:  [84 %-100 %] 94 % (10/29 0649) Weight:  [54 kg-55.1 kg] 55.1 kg (  10/29 0649)    General: Well-nourished, well-developed in no acute distress.  Appears older than stated age.  Nasal cannula, 5 L without evidence of shortness of breath. Head: Normocephalic, atraumatic.   Eyes: Conjunctiva pink, no icterus. Mouth: Oropharyngeal mucosa moist and pink , no lesions erythema or exudate. Neck: Supple without thyromegaly, masses, or lymphadenopathy.  Lungs: Scattered wheezes in the bases Heart: Regular rate and rhythm, no murmurs rubs or gallops.  Abdomen: Bowel sounds are normal, nontender, nondistended,  no hepatosplenomegaly or masses, no abdominal bruits or    hernia , no rebound or guarding.   Rectal: Not performed Extremities: No lower extremity edema, clubbing, deformity.  Neuro: Alert and oriented x 4 , grossly normal neurologically.  Skin: Warm and dry, no rash or jaundice.   Psych: Alert and cooperative, normal mood and affect.        Intake/Output from previous day: 10/28 0701 - 10/29 0700 In: 650 [I.V.:20; Blood:630] Out: -  Intake/Output this shift: No intake/output data recorded.  Lab Results: CBC Recent Labs    05/24/19 1618 05/25/19 0549  WBC 12.1* 8.8  HGB 6.9* 10.1*  HCT 23.5* 32.3*  MCV 88.0 89.2  PLT 242 194   BMET Recent Labs    05/24/19 1618 05/25/19 0549  NA 136 137  K 3.7 3.9  CL 105 104  CO2 23 22  GLUCOSE 205* 223*  BUN 23* 13  CREATININE 0.91 0.58  CALCIUM 8.4* 8.5*   LFT Recent Labs    05/24/19 1618  BILITOT 0.3  ALKPHOS 54  AST 19  ALT 15  PROT 6.2*  ALBUMIN 3.1*    Lipase No results for input(s): LIPASE in the last 72 hours.  PT/INR No results for input(s): LABPROT, INR in the last 72 hours.    Imaging Studies: Dg Chest Portable 1 View  Result Date: 05/24/2019 CLINICAL DATA:  Shortness of breath, pneumonia since summer, recent hospitalization EXAM: PORTABLE CHEST 1 VIEW COMPARISON:  Radiograph 05/06/2019 FINDINGS: Bandlike areas of opacities in the mid lungs may reflect subsegmental atelectasis. There are chronic bronchitic changes throughout the lungs with diffuse airways thickening. Some patchy suprahilar and apical opacities are present. No pneumothorax or effusion. Cardiomediastinal contours are stable from prior including a calcified aorta. No acute osseous or soft tissue abnormality. IMPRESSION: 1. Bandlike areas of opacities in the mid lungs may reflect subsegmental atelectasis. 2. Some patchy suprahilar and apical opacities are present with airways thickening and bronchitic features could reflect ongoing infection or  inflammation. Electronically Signed   By: Lovena Le M.D.   On: 05/24/2019 16:58   Dg Chest Port 1 View  Result Date: 05/06/2019 CLINICAL DATA:  Pt states she has asthma and has been battling pneumonia since a hospital admission in July. Pt seen PCP at [facility] Thursday has been on multiple rounds of antibiotics. Follow-up with PCP scheduled next week. EXAM: PORTABLE CHEST 1 VIEW COMPARISON:  Chest radiograph 02/03/2019, 04/28/2018 FINDINGS: The heart size and mediastinal contours are within normal limits. There are diffuse bilateral interstitial opacities. There are small focal opacities in the right base and left mid lung. No pneumothorax or large pleural effusion. No acute finding in the visualized skeleton. IMPRESSION: 1. Diffuse bilateral interstitial opacities could represent reactive airways disease, atypical infection, or edema. 2. Small focal opacities in the right base and left mid lung may reflect superimposed atelectasis versus infection. Electronically Signed   By: Audie Pinto M.D.   On: 05/06/2019 18:26  [4 week]   Impression: Pleasant  48 year old female with transfusion dependent anemia, second hospitalization this month.  Anemia panel consistent with IDA.  Notably last admission her B12 level was 142.  EGD last admission did not explain transfusion dependent anemia.  Reported duodenal ulcer at the duodenal sweep in June 2020, not seen on recent EGD.  Attempted colonoscopy in 2018, incomplete due to poor prep.  Last complete colonoscopy 2014 as outlined above.  Heme-negative stool yesterday.  No overt GI bleeding noted this admission.  Differential diagnosis includes occult GI bleeding from the small bowel or colon.  B12 deficiency: B12 level 142 earlier this month.  Chronic pancreatitis: Stable at this time.    Plan: 1. Management of B12 deficiency per attending 2. Consider upper endoscopy with small bowel capsule study as per previous plan.  To discuss with Dr.  Oneida Alar. 3. Dr. Newman Pies had plans for repeat colonoscopy this year, may need to be done sooner if no explanation for IDA. 4. Continue PPI twice daily. 5. Adjusted Creon, weight-based for admission.  It is not clear what dose of Zenpep she is on as an outpatient but patient tells me she takes twice daily after meals.  Instructed her to try taking at onset of meals 3 times daily and with snacks as well.  She can discuss further with Dr. Newman Pies at follow-up.  We would like to thank you for the opportunity to participate in the care of [place].  Laureen Ochs. Bernarda Caffey [facility] [phone] 10/29/20203:44 PM    LOS: 1 day

## 2019-05-25 NOTE — Progress Notes (Signed)
PROGRESS NOTE  [name]  [id number]  DOB: 1971/03/16  DOA: 05/24/2019 PCP: Redmond School, MD   Brief Admission Hx: 48 y.o. female, w Hypertension, Hyperlipidemia, Dm2, w neuropathy, Gerd, Asthma/ Copd, on oxygen at home, 3 L nasal cannula intermittently, patient presents to ED secondary to complaints of shortness of breath, patient with recent hospitalization secondary to asthma exacerbation, and anemia, status post endoscopy and PRBC transfusion, patient presents to ED secondary to complaints of dyspnea, 1 week, worsening over the last 2 days, as well she reports her phlegm, becoming productive, green in color, she denies any hemoptysis, chest pain, fever or chills, he denies any melena, coffee-ground emesis, nausea or vomiting. - in ED patient was noted to have anemia with hemoglobin of 6.9, as well her chest x-ray significant opacities questionable for pneumonia, she had significant wheezing on presentation, which she did require IV steroids.  MDM/Assessment & Plan:   1. Acute on chronic respiratory failure secondary to acute COPD exacerbation-patient has had multiple recent admissions for similar continue IV steroids, antibiotics and supportive therapy continue bronchodilators.  COVID-19 test is negative. 2. Chronic blood loss anemia-pt s/p 2 units PRBCs, GI following for consideration of capsule endoscopy. 3. Essential hypertension - temporarily on hold. 4. Dyslipidemia- atorvastatin. 5. Type 2 DM with hyperglycemia steroid induced - add prandial coverage, monitor CBG closely.  6. B12 deficiency - IM injections ordered, follow up with PCP.  7. Hypothyroidism - oral replacement ordered.  8. Anx/depression - resume home medications.  9. Pancreatic insufficiency with chronic pancreatitis history - resumed home pancreatic enzymes.    DVT prophylaxis: SCDs Code Status: Full  Family Communication: patient updated, verbalized understanding Disposition Plan: inpatient  treatments, IV steroids    Consultants:  GI  Procedures:    Antimicrobials:  Doxycycline 10/29>>    Subjective: Pt reports cough, wheezing, SOB, weakness  Objective: Vitals:   05/25/19 0600 05/25/19 0649 05/25/19 1046 05/25/19 1324  BP: 129/73 106/76  102/66  Pulse: 84 96  (!) 107  Resp: 10 16  20   Temp:  98 F (36.7 C)  97.9 F (36.6 C)  TempSrc:    Oral  SpO2: 94% 94% 95% 100%  Weight:  55.1 kg    Height:  5\' 2"  (1.575 m)      Intake/Output Summary (Last 24 hours) at 05/25/2019 1442 Last data filed at 05/25/2019 0418 Gross per 24 hour  Intake 650 ml  Output -  Net 650 ml   Filed Weights   05/24/19 1450 05/25/19 0649  Weight: 54 kg 55.1 kg     REVIEW OF SYSTEMS  As per history otherwise all reviewed and reported negative  Exam:  General exam: awake, alert, chronically ill appearing.  Respiratory system: diffuse wheezing bilateral. Mild increased work of breathing. Cardiovascular system: S1 & S2 heard. No JVD, murmurs, gallops, clicks or pedal edema. Gastrointestinal system: Abdomen is nondistended, soft and nontender. Normal bowel sounds heard. Central nervous system: Alert and oriented. No focal neurological deficits. Extremities: no CCE.  Data Reviewed: Basic Metabolic Panel: Recent Labs  Lab 05/24/19 1618 05/25/19 0549  NA 136 137  K 3.7 3.9  CL 105 104  CO2 23 22  GLUCOSE 205* 223*  BUN 23* 13  CREATININE 0.91 0.58  CALCIUM 8.4* 8.5*   Liver Function Tests: Recent Labs  Lab 05/24/19 1618  AST 19  ALT 15  ALKPHOS 54  BILITOT 0.3  PROT 6.2*  ALBUMIN 3.1*   No results for input(s): LIPASE, AMYLASE in the  last 168 hours. No results for input(s): AMMONIA in the last 168 hours. CBC: Recent Labs  Lab 05/24/19 1618 05/25/19 0549  WBC 12.1* 8.8  NEUTROABS 9.0*  --   HGB 6.9* 10.1*  HCT 23.5* 32.3*  MCV 88.0 89.2  PLT 242 194   Cardiac Enzymes: No results for input(s): CKTOTAL, CKMB, CKMBINDEX, TROPONINI in the last 168  hours. CBG (last 3)  Recent Labs    05/24/19 2218 05/25/19 0744 05/25/19 1117  GLUCAP 201* 178* 290*   Recent Results (from the past 240 hour(s))  SARS CORONAVIRUS 2 (TAT 6-24 HRS) Nasopharyngeal Nasopharyngeal Swab     Status: None   Collection Time: 05/24/19  6:00 PM   Specimen: Nasopharyngeal Swab  Result Value Ref Range Status   SARS Coronavirus 2 NEGATIVE NEGATIVE Final    Comment: (NOTE) SARS-CoV-2 target nucleic acids are NOT DETECTED. The SARS-CoV-2 RNA is generally detectable in upper and lower respiratory specimens during the acute phase of infection. Negative results do not preclude SARS-CoV-2 infection, do not rule out co-infections with other pathogens, and should not be used as the sole basis for treatment or other patient management decisions. Negative results must be combined with clinical observations, patient history, and epidemiological information. The expected result is Negative. Fact Sheet for Patients: SugarRoll.be Fact Sheet for Healthcare Providers: https://www.woods-mathews.com/ This test is not yet approved or cleared by the [place] FDA and  has been authorized for detection and/or diagnosis of SARS-CoV-2 by FDA under an Emergency Use Authorization (EUA). This EUA will remain  in effect (meaning this test can be used) for the duration of the COVID-19 declaration under Section 56 4(b)(1) of the Act, 21 U.S.C. section 360bbb-3(b)(1), unless the authorization is terminated or revoked sooner. Performed at [facility], [place] [address] [place], [place] [address]   MRSA PCR Screening     Status: None   Collection Time: 05/24/19  6:32 PM   Specimen: Nasal Mucosa; Nasopharyngeal  Result Value Ref Range Status   MRSA by PCR NEGATIVE NEGATIVE Final    Comment:        The GeneXpert MRSA Assay (FDA approved for NASAL specimens only), is one component of a comprehensive MRSA colonization surveillance  program. It is not intended to diagnose MRSA infection nor to guide or monitor treatment for MRSA infections. Performed at [facility], [address], [place], [name] [address]      Studies: Dg Chest Portable 1 View  Result Date: 05/24/2019 CLINICAL DATA:  Shortness of breath, pneumonia since summer, recent hospitalization EXAM: PORTABLE CHEST 1 VIEW COMPARISON:  Radiograph 05/06/2019 FINDINGS: Bandlike areas of opacities in the mid lungs may reflect subsegmental atelectasis. There are chronic bronchitic changes throughout the lungs with diffuse airways thickening. Some patchy suprahilar and apical opacities are present. No pneumothorax or effusion. Cardiomediastinal contours are stable from prior including a calcified aorta. No acute osseous or soft tissue abnormality. IMPRESSION: 1. Bandlike areas of opacities in the mid lungs may reflect subsegmental atelectasis. 2. Some patchy suprahilar and apical opacities are present with airways thickening and bronchitic features could reflect ongoing infection or inflammation. Electronically Signed   By: Lovena Le M.D.   On: 05/24/2019 16:58   Scheduled Meds: . atorvastatin  20 mg Oral Daily  . cyanocobalamin  1,000 mcg Intramuscular Weekly  . doxycycline  100 mg Oral Q12H  . feeding supplement  1 Container Oral TID BM  . fluticasone furoate-vilanterol  1 puff Inhalation Daily  . gabapentin  300  mg Oral TID  . insulin aspart  0-15 Units Subcutaneous TID WC  . insulin aspart  0-5 Units Subcutaneous QHS  . levothyroxine  25 mcg Oral QAC breakfast  . lipase/protease/amylase  24,000 Units Oral With snacks  . lipase/protease/amylase  36,000 Units Oral TID WC  . lisinopril  2.5 mg Oral Daily  . methylPREDNISolone (SOLU-MEDROL) injection  40 mg Intravenous Q12H  . multivitamin with minerals  1 tablet Oral Daily  . pantoprazole (PROTONIX) IV  40 mg Intravenous Q24H  . umeclidinium bromide  1 puff Inhalation Daily  . valACYclovir  500 mg Oral  Daily   Continuous Infusions:  Active Problems:   ANEMIA, IRON DEFICIENCY   Essential hypertension   GERD   Asthma exacerbation   CAP (community acquired pneumonia)   COPD exacerbation ([place])   DM type 2 (diabetes mellitus, type 2) ([place])   Diabetic neuropathy ([place])   Vitamin B12 deficiency  Time spent:   Irwin Brakeman, MD [facility] 05/25/2019, 2:42 PM    LOS: 1 day  How to contact the [facility] Attending or Consulting provider [place] or covering provider during after hours [place], for this patient?  1. Check the care team in [facility] and look for a) attending/consulting TRH provider listed and b) the [facility] team listed 2. Log into [email or web address] and use [place]'s universal password to access. If you do not have the password, please contact the hospital operator. 3. Locate the [facility] provider you are looking for under [facility] and page to a number that you can be directly reached. 4. If you still have difficulty reaching the provider, please page the [facility] (Director on Call) for the Hospitalists listed on amion for assistance.

## 2019-05-25 NOTE — ED Notes (Signed)
Pt requesting something for her "nerves", attending paged.

## 2019-05-26 ENCOUNTER — Inpatient Hospital Stay (HOSPITAL_COMMUNITY): Payer: Medicare Other | Admitting: Anesthesiology

## 2019-05-26 ENCOUNTER — Encounter (HOSPITAL_COMMUNITY): Payer: Self-pay

## 2019-05-26 ENCOUNTER — Other Ambulatory Visit: Payer: Self-pay

## 2019-05-26 ENCOUNTER — Encounter (HOSPITAL_COMMUNITY)
Admission: EM | Disposition: A | Discharge status: Other Acute Inpatient Hospital | Payer: Self-pay | Source: Home / Self Care | Attending: Family Medicine

## 2019-05-26 DIAGNOSIS — K311 Adult hypertrophic pyloric stenosis: Secondary | ICD-10-CM | POA: Diagnosis not present

## 2019-05-26 DIAGNOSIS — K2100 Gastro-esophageal reflux disease with esophagitis, without bleeding: Secondary | ICD-10-CM | POA: Diagnosis not present

## 2019-05-26 DIAGNOSIS — K2970 Gastritis, unspecified, without bleeding: Secondary | ICD-10-CM

## 2019-05-26 DIAGNOSIS — D649 Anemia, unspecified: Secondary | ICD-10-CM

## 2019-05-26 HISTORY — PX: ESOPHAGOGASTRODUODENOSCOPY (EGD) WITH PROPOFOL: SHX5813

## 2019-05-26 HISTORY — PX: GIVENS CAPSULE STUDY: SHX5432

## 2019-05-26 LAB — TYPE AND SCREEN
ABO/RH(D): A POS
Antibody Screen: NEGATIVE
Unit division: 0
Unit division: 0

## 2019-05-26 LAB — BASIC METABOLIC PANEL
Anion gap: 7 (ref 5–15)
BUN: 12 mg/dL (ref 6–20)
CO2: 26 mmol/L (ref 22–32)
Calcium: 8.9 mg/dL (ref 8.9–10.3)
Chloride: 104 mmol/L (ref 98–111)
Creatinine, Ser: 0.61 mg/dL (ref 0.44–1.00)
GFR calc Af Amer: >60 mL/min (ref >60)
GFR calc non Af Amer: >60 mL/min (ref >60)
Glucose, Bld: 185 mg/dL — ABNORMAL HIGH (ref 70–99)
Potassium: 4.5 mmol/L (ref 3.5–5.1)
Sodium: 137 mmol/L (ref 135–145)

## 2019-05-26 LAB — CBC WITH DIFFERENTIAL/PLATELET
Abs Immature Granulocytes: 0.04 10*3/uL (ref 0.00–0.07)
Basophils Absolute: 0 10*3/uL (ref 0.0–0.1)
Basophils Relative: 0 %
Eosinophils Absolute: 0 10*3/uL (ref 0.0–0.5)
Eosinophils Relative: 0 %
HCT: 33 % — ABNORMAL LOW (ref 36.0–46.0)
Hemoglobin: 10.3 g/dL — ABNORMAL LOW (ref 12.0–15.0)
Immature Granulocytes: 0 %
Lymphocytes Relative: 8 %
Lymphs Abs: 0.7 10*3/uL (ref 0.7–4.0)
MCH: 28.2 pg (ref 26.0–34.0)
MCHC: 31.2 g/dL (ref 30.0–36.0)
MCV: 90.4 fL (ref 80.0–100.0)
Monocytes Absolute: 0.3 10*3/uL (ref 0.1–1.0)
Monocytes Relative: 3 %
Neutro Abs: 8.2 10*3/uL — ABNORMAL HIGH (ref 1.7–7.7)
Neutrophils Relative %: 89 %
Platelets: 224 10*3/uL (ref 150–400)
RBC: 3.65 MIL/uL — ABNORMAL LOW (ref 3.87–5.11)
RDW: 21.1 % — ABNORMAL HIGH (ref 11.5–15.5)
WBC: 9.3 10*3/uL (ref 4.0–10.5)
nRBC: 0 % (ref 0.0–0.2)

## 2019-05-26 LAB — GLUCOSE, CAPILLARY
Glucose-Capillary: 166 mg/dL — ABNORMAL HIGH (ref 70–99)
Glucose-Capillary: 102 mg/dL — ABNORMAL HIGH (ref 70–99)
Glucose-Capillary: 242 mg/dL — ABNORMAL HIGH (ref 70–99)
Glucose-Capillary: 160 mg/dL — ABNORMAL HIGH (ref 70–99)

## 2019-05-26 LAB — BPAM RBC
Blood Product Expiration Date: 202012052359
Blood Product Expiration Date: 202012062359
ISSUE DATE / TIME: 202010282056
ISSUE DATE / TIME: 202010290141
Unit Type and Rh: 6200
Unit Type and Rh: 6200

## 2019-05-26 SURGERY — ESOPHAGOGASTRODUODENOSCOPY (EGD) WITH PROPOFOL
Anesthesia: General

## 2019-05-26 MED ORDER — PROPOFOL 10 MG/ML IV BOLUS
INTRAVENOUS | Status: DC | PRN
  Administered 2019-05-26: 20 mg via INTRAVENOUS

## 2019-05-26 MED ORDER — LACTATED RINGERS IV SOLN
INTRAVENOUS | Status: DC | PRN
  Administered 2019-05-26: 07:00:00 via INTRAVENOUS

## 2019-05-26 MED ORDER — KETAMINE HCL 50 MG/5ML IJ SOSY
PREFILLED_SYRINGE | INTRAMUSCULAR | Status: AC
  Filled 2019-05-26: qty 5

## 2019-05-26 MED ORDER — IPRATROPIUM-ALBUTEROL 0.5-2.5 (3) MG/3ML IN SOLN
3.0000 mL | Freq: Once | RESPIRATORY_TRACT | Status: AC
  Administered 2019-05-26: 3 mL via RESPIRATORY_TRACT

## 2019-05-26 MED ORDER — LIDOCAINE HCL (CARDIAC) PF 100 MG/5ML IV SOSY
PREFILLED_SYRINGE | INTRAVENOUS | Status: DC | PRN
  Administered 2019-05-26: 60 mg via INTRAVENOUS

## 2019-05-26 MED ORDER — STERILE WATER FOR IRRIGATION IR SOLN
Status: DC | PRN
  Administered 2019-05-26: 2.5 mL

## 2019-05-26 MED ORDER — PANTOPRAZOLE SODIUM 40 MG IV SOLR
40.0000 mg | Freq: Two times a day (BID) | INTRAVENOUS | Status: DC
  Administered 2019-05-26 – 2019-05-30 (×9): 40 mg via INTRAVENOUS
  Filled 2019-05-26 (×9): qty 40

## 2019-05-26 MED ORDER — GLYCOPYRROLATE 0.2 MG/ML IJ SOLN
INTRAMUSCULAR | Status: DC | PRN
  Administered 2019-05-26: 0.2 mg via INTRAVENOUS

## 2019-05-26 MED ORDER — LACTATED RINGERS IV SOLN
Freq: Once | INTRAVENOUS | Status: AC
  Administered 2019-05-26: 07:00:00 via INTRAVENOUS

## 2019-05-26 MED ORDER — KETAMINE HCL 10 MG/ML IJ SOLN
INTRAMUSCULAR | Status: DC | PRN
  Administered 2019-05-26: 20 mg via INTRAVENOUS

## 2019-05-26 MED ORDER — SODIUM CHLORIDE 0.9 % IV SOLN: INTRAVENOUS | Status: DC

## 2019-05-26 MED ORDER — ALBUTEROL SULFATE (2.5 MG/3ML) 0.083% IN NEBU
2.5000 mg | INHALATION_SOLUTION | Freq: Three times a day (TID) | RESPIRATORY_TRACT | Status: DC
  Administered 2019-05-26 – 2019-05-28 (×6): 2.5 mg via RESPIRATORY_TRACT
  Filled 2019-05-26 (×7): qty 3

## 2019-05-26 MED ORDER — PROPOFOL 10 MG/ML IV BOLUS
INTRAVENOUS | Status: AC
  Filled 2019-05-26: qty 120

## 2019-05-26 MED ORDER — IPRATROPIUM-ALBUTEROL 0.5-2.5 (3) MG/3ML IN SOLN
RESPIRATORY_TRACT | Status: AC
  Filled 2019-05-26: qty 3

## 2019-05-26 MED ORDER — PROPOFOL 500 MG/50ML IV EMUL
INTRAVENOUS | Status: DC | PRN
  Administered 2019-05-26: 150 ug/kg/min via INTRAVENOUS

## 2019-05-26 NOTE — Care Management Important Message (Signed)
Important Message  Patient Details  Name: [name] MRN: [id number] Date of Birth: 1971-05-09   Medicare Important Message Given:  Yes     Tommy Medal 05/26/2019, 4:21 PM

## 2019-05-26 NOTE — Anesthesia Preprocedure Evaluation (Signed)
Anesthesia Evaluation  Patient identified by MRN, date of birth, ID band Patient awake    Reviewed: Allergy & Precautions, NPO status , Patient's Chart, lab work & pertinent test results  Airway Mallampati: II  TM Distance: >3 FB Neck ROM: Full    Dental  (+) Missing, Dental Advisory Given   Pulmonary asthma , pneumonia, resolved, COPD,  oxygen dependent, former smoker,    Pulmonary exam normal  + wheezing      Cardiovascular hypertension, Pt. on medications Normal cardiovascular exam Rhythm:Regular Rate:Normal     Neuro/Psych  Headaches, PSYCHIATRIC DISORDERS Anxiety Depression Bipolar Disorder  Neuromuscular disease    GI/Hepatic hiatal hernia, GERD  Medicated,(+)     substance abuse (chronic pain on oxycodone)  ,   Endo/Other  diabetes, Well Controlled, Type 2  Renal/GU      Musculoskeletal  (+) Arthritis , narcotic dependent  Abdominal   Peds  Hematology  (+) anemia ,   Anesthesia Other Findings   Reproductive/Obstetrics                             Anesthesia Physical Anesthesia Plan  ASA: IV  Anesthesia Plan: General   Post-op Pain Management:    Induction: Intravenous  PONV Risk Score and Plan: TIVA  Airway Management Planned:   Additional Equipment:   Intra-op Plan:   Post-operative Plan:   Informed Consent: I have reviewed the patients History and Physical, chart, labs and discussed the procedure including the risks, benefits and alternatives for the proposed anesthesia with the patient or authorized representative who has indicated his/her understanding and acceptance.     Dental advisory given  Plan Discussed with: CRNA  Anesthesia Plan Comments: (Patient is wheezing, will give duoneb nebulizer treatment before the procedure)        Anesthesia Quick Evaluation

## 2019-05-26 NOTE — Interval H&P Note (Signed)
History and Physical Interval Note: NO HISTORY OF BOWEL OBSTRUCTION, EGD with givens today. DISCUSSED PROCEDURE, BENEFITS, & RISKS: < 1% chance of medication reaction, PERFORATION, CAPSULE RETENTION REQUIRING SURGERY TO RETRIEVE IT, ASPIRATION, OR bleeding. 05/26/2019 7:08 AM  [name]  has presented today for surgery, with the diagnosis of transfusion dependent anemia, IDA.  The various methods of treatment have been discussed with the patient and family. After consideration of risks, benefits and other options for treatment, the patient has consented to  Procedure(s): ESOPHAGOGASTRODUODENOSCOPY (EGD) WITH PROPOFOL (N/A) GIVENS CAPSULE STUDY (N/A) as a surgical intervention.  The patient's history has been reviewed, patient examined, no change in status, stable for surgery.  I have reviewed the patient's chart and labs.  Questions were answered to the patient's satisfaction.     [employer]

## 2019-05-26 NOTE — Anesthesia Procedure Notes (Signed)
Procedure Name: General with mask airway Date/Time: 05/26/2019 7:26 AM Performed by: Andree Elk, Jeane Cashatt A, CRNA Pre-anesthesia Checklist: Timeout performed, Patient being monitored, Suction available, Emergency Drugs available and Patient identified Patient Re-evaluated:Patient Re-evaluated prior to induction Oxygen Delivery Method: Non-rebreather mask

## 2019-05-26 NOTE — Procedures (Addendum)
  PATIENT DATA: WEIGHT: 121 LBS  GASTRIC PASSAGE TIME: 0 m, SB PASSAGE TIME: UNABLE TO CALCULATE  RESULTS: LIMITED views of gastric mucosa.  BRIGHT RED BLOOD/AVMs SEEN STARTING AT 08:56 UNTIL 23:00.  NO ACTIVE OOZING/BLEEDING.  LIMITED VIEWS OF THE COLON DUE TO RETAINED CONTENTS. No old OR FRESH blood in the stomach. CAPSULE DID NOT REACH THE CECUM  DIAGNOSIS: ACTIVE GI BLEED IN THE DUODENUM  Plan: 1. PUSH ENTEROSCOPY V. BALLOON ENTEROSCOPY MON NOV 2 AT [facility] OR [facility]. WILL DISCUSS WITH [facility] [id number]: SPOKE TO DR. MYERS). SHE HAS ACCEPTED PT FOR PROCEDURE ON NOV 2. WAIT FOR BED IS 48-72 HRS. 1959: DR. Emily Filbert ACCEPTED PT FOR TRANSFER FOR ENTEROSCOPY. 2. SUPPORTIVE CARE.

## 2019-05-26 NOTE — Progress Notes (Signed)
PROGRESS NOTE  [name]  [id number]  DOB: Dec 19, 1970  DOA: 05/24/2019 PCP: Redmond School, MD   Brief Admission Hx: 48 y.o. female, w Hypertension, Hyperlipidemia, Dm2, w neuropathy, Gerd, Asthma/ Copd, on oxygen at home, 3 L nasal cannula intermittently, patient presents to ED secondary to complaints of shortness of breath, patient with recent hospitalization secondary to asthma exacerbation, and anemia, status post endoscopy and PRBC transfusion, patient presents to ED secondary to complaints of dyspnea, 1 week, worsening over the last 2 days, as well she reports her phlegm, becoming productive, green in color, she denies any hemoptysis, chest pain, fever or chills, he denies any melena, coffee-ground emesis, nausea or vomiting. - in ED patient was noted to have anemia with hemoglobin of 6.9, as well her chest x-ray significant opacities questionable for pneumonia, she had significant wheezing on presentation, which she did require IV steroids.  MDM/Assessment & Plan:   1. Acute on chronic respiratory failure secondary to acute COPD exacerbation-patient has had multiple recent admissions for similar continue IV steroids, antibiotics and supportive therapy continue bronchodilators.  COVID-19 test is negative.  She is slowly improving continue current therapy. 2. Chronic blood loss anemia-pt s/p 2 units PRBCs, GI placed capsule endoscopy and EGD done today. 3. Essential hypertension - blood pressures stable.  4. Dyslipidemia- atorvastatin. 5. Type 2 DM with hyperglycemia steroid induced - add prandial coverage, monitor CBG closely.  6. B12 deficiency - IM injections ordered, follow up with PCP.  7. Hypothyroidism - oral replacement ordered.  8. Anx/depression - resume home medications.  9. Pancreatic insufficiency with chronic pancreatitis history - resumed home pancreatic enzymes.    DVT prophylaxis: SCDs Code Status: Full  Family Communication: patient updated, verbalized  understanding Disposition Plan: inpatient treatments, IV steroids, capsule endoscopy    Consultants:  GI  Procedures:    Antimicrobials:  Doxycycline 10/29>>    Subjective: Pt reports some improvements in SOB.  Less fatigue today.   Objective: Vitals:   05/26/19 0502 05/26/19 0652 05/26/19 0800 05/26/19 0815  BP: 108/71 123/78 107/62 111/74  Pulse: 100 90 92 94  Resp: 17 12 15 12   Temp: 98.2 F (36.8 C) 98.3 F (36.8 C) 98.3 F (36.8 C)   TempSrc:  (P) Oral    SpO2: 95% 100% 98% 100%  Weight:      Height:        Intake/Output Summary (Last 24 hours) at 05/26/2019 1227 Last data filed at 05/26/2019 0756 Gross per 24 hour  Intake 1132.94 ml  Output -  Net 1132.94 ml   Filed Weights   05/24/19 1450 05/25/19 0649  Weight: 54 kg 55.1 kg   REVIEW OF SYSTEMS  As per history otherwise all reviewed and reported negative  Exam:  General exam: awake, alert, chronically ill appearing.  Respiratory system: diffuse wheezing bilateral. Mild increased work of breathing. Cardiovascular system: S1 & S2 heard. No JVD, murmurs, gallops, clicks or pedal edema. Gastrointestinal system: Abdomen is nondistended, soft and nontender. Normal bowel sounds heard. Central nervous system: Alert and oriented. No focal neurological deficits. Extremities: no CCE.  Data Reviewed: Basic Metabolic Panel: Recent Labs  Lab 05/24/19 1618 05/25/19 0549 05/26/19 0633  NA 136 137 137  K 3.7 3.9 4.5  CL 105 104 104  CO2 23 22 26   GLUCOSE 205* 223* 185*  BUN 23* 13 12  CREATININE 0.91 0.58 0.61  CALCIUM 8.4* 8.5* 8.9   Liver Function Tests: Recent Labs  Lab 05/24/19 1618  AST 19  ALT 15  ALKPHOS 54  BILITOT 0.3  PROT 6.2*  ALBUMIN 3.1*   No results for input(s): LIPASE, AMYLASE in the last 168 hours. No results for input(s): AMMONIA in the last 168 hours. CBC: Recent Labs  Lab 05/24/19 1618 05/25/19 0549 05/26/19 0633  WBC 12.1* 8.8 9.3  NEUTROABS 9.0*  --  8.2*   HGB 6.9* 10.1* 10.3*  HCT 23.5* 32.3* 33.0*  MCV 88.0 89.2 90.4  PLT 242 194 224   Cardiac Enzymes: No results for input(s): CKTOTAL, CKMB, CKMBINDEX, TROPONINI in the last 168 hours. CBG (last 3)  Recent Labs    05/25/19 2034 05/26/19 0718 05/26/19 1114  GLUCAP 158* 166* 102*   Recent Results (from the past 240 hour(s))  SARS CORONAVIRUS 2 (TAT 6-24 HRS) Nasopharyngeal Nasopharyngeal Swab     Status: None   Collection Time: 05/24/19  6:00 PM   Specimen: Nasopharyngeal Swab  Result Value Ref Range Status   SARS Coronavirus 2 NEGATIVE NEGATIVE Final    Comment: (NOTE) SARS-CoV-2 target nucleic acids are NOT DETECTED. The SARS-CoV-2 RNA is generally detectable in upper and lower respiratory specimens during the acute phase of infection. Negative results do not preclude SARS-CoV-2 infection, do not rule out co-infections with other pathogens, and should not be used as the sole basis for treatment or other patient management decisions. Negative results must be combined with clinical observations, patient history, and epidemiological information. The expected result is Negative. Fact Sheet for Patients: SugarRoll.be Fact Sheet for Healthcare Providers: https://www.woods-mathews.com/ This test is not yet approved or cleared by the [place] FDA and  has been authorized for detection and/or diagnosis of SARS-CoV-2 by FDA under an Emergency Use Authorization (EUA). This EUA will remain  in effect (meaning this test can be used) for the duration of the COVID-19 declaration under Section 56 4(b)(1) of the Act, 21 U.S.C. section 360bbb-3(b)(1), unless the authorization is terminated or revoked sooner. Performed at [facility], [place] [address] [place], [place] [address]   MRSA PCR Screening     Status: None   Collection Time: 05/24/19  6:32 PM   Specimen: Nasal Mucosa; Nasopharyngeal  Result Value Ref Range Status   MRSA by  PCR NEGATIVE NEGATIVE Final    Comment:        The GeneXpert MRSA Assay (FDA approved for NASAL specimens only), is one component of a comprehensive MRSA colonization surveillance program. It is not intended to diagnose MRSA infection nor to guide or monitor treatment for MRSA infections. Performed at [facility], [address] [place], [place] [address]      Studies: Dg Chest Portable 1 View  Result Date: 05/24/2019 CLINICAL DATA:  Shortness of breath, pneumonia since summer, recent hospitalization EXAM: PORTABLE CHEST 1 VIEW COMPARISON:  Radiograph 05/06/2019 FINDINGS: Bandlike areas of opacities in the mid lungs may reflect subsegmental atelectasis. There are chronic bronchitic changes throughout the lungs with diffuse airways thickening. Some patchy suprahilar and apical opacities are present. No pneumothorax or effusion. Cardiomediastinal contours are stable from prior including a calcified aorta. No acute osseous or soft tissue abnormality. IMPRESSION: 1. Bandlike areas of opacities in the mid lungs may reflect subsegmental atelectasis. 2. Some patchy suprahilar and apical opacities are present with airways thickening and bronchitic features could reflect ongoing infection or inflammation. Electronically Signed   By: Lovena Le M.D.   On: 05/24/2019 16:58   Scheduled Meds: . albuterol  2.5 mg Nebulization TID  . atorvastatin  20 mg Oral Daily  .  cyanocobalamin  1,000 mcg Intramuscular Weekly  . doxycycline  100 mg Oral Q12H  . feeding supplement  1 Container Oral TID BM  . fluticasone furoate-vilanterol  1 puff Inhalation Daily  . gabapentin  300 mg Oral TID  . insulin aspart  0-15 Units Subcutaneous TID WC  . insulin aspart  0-5 Units Subcutaneous QHS  . levothyroxine  25 mcg Oral QAC breakfast  . lipase/protease/amylase  24,000 Units Oral With snacks  . lipase/protease/amylase  36,000 Units Oral TID WC  . lisinopril  2.5 mg Oral Daily  . methylPREDNISolone (SOLU-MEDROL)  injection  40 mg Intravenous Q12H  . multivitamin with minerals  1 tablet Oral Daily  . pantoprazole (PROTONIX) IV  40 mg Intravenous BID AC  . umeclidinium bromide  1 puff Inhalation Daily  . valACYclovir  500 mg Oral Daily   Continuous Infusions:  Active Problems:   ANEMIA, IRON DEFICIENCY   Essential hypertension   GERD   Asthma exacerbation   CAP (community acquired pneumonia)   COPD exacerbation ([place])   DM type 2 (diabetes mellitus, type 2) ([place])   Diabetic neuropathy ([place])   Vitamin B12 deficiency   Symptomatic anemia  Time spent:   Irwin Brakeman, MD [facility] 05/26/2019, 12:27 PM    LOS: 2 days  How to contact the [facility] Attending or Consulting provider [place] or covering provider during after hours [place], for this patient?  1. Check the care team in [facility] and look for a) attending/consulting TRH provider listed and b) the [facility] team listed 2. Log into [email or web address] and use [place]'s universal password to access. If you do not have the password, please contact the hospital operator. 3. Locate the [facility] provider you are looking for under [facility] and page to a number that you can be directly reached. 4. If you still have difficulty reaching the provider, please page the [facility] (Director on Call) for the Hospitalists listed on amion for assistance.

## 2019-05-26 NOTE — Op Note (Signed)
[facility] Patient Name: [name] Procedure Date: 05/26/2019 6:58 AM MRN: [id number] Date of Birth: 1970/10/30 Attending MD: Barney Drain MD, MD CSN: [id number] Age: 48 Admit Type: Inpatient Procedure:                Upper GI endoscopy WITH BALLOON DILATION/GIVENS                            CAPSULE PLACEMENT Indications:              Iron deficiency anemia secondary to chronic blood                            loss Providers:                Barney Drain MD, MD, Lurline Del, RN, Randa Spike, Technician Referring MD:             Redmond School, MD Medicines:                Propofol per Anesthesia Complications:            No immediate complications. Estimated Blood Loss:     Estimated blood loss: none. Procedure:                Pre-Anesthesia Assessment:                           - Prior to the procedure, a History and Physical                            was performed, and patient medications and                            allergies were reviewed. The patient's tolerance of                            previous anesthesia was also reviewed. The risks                            and benefits of the procedure and the sedation                            options and risks were discussed with the patient.                            All questions were answered, and informed consent                            was obtained. Prior Anticoagulants: The patient has                            taken no previous anticoagulant or antiplatelet                            agents. ASA  Grade Assessment: II - A patient with                            mild systemic disease. After reviewing the risks                            and benefits, the patient was deemed in                            satisfactory condition to undergo the procedure.                            After obtaining informed consent, the endoscope was                            passed under direct  vision. Throughout the                            procedure, the patient's blood pressure, pulse, and                            oxygen saturations were monitored continuously. The                            [id number]) scope was introduced through the                            mouth, and advanced to the second part of duodenum.                            The upper GI endoscopy was accomplished without                            difficulty. The patient tolerated the procedure                            well. Scope In: 7:34:53 AM Scope Out: 7:48:20 AM Total Procedure Duration: 0 hours 13 minutes 27 seconds  Findings:      LA Grade C (one or more mucosal breaks continuous between tops of 2 or       more mucosal folds, less than 75% circumference) esophagitis with       bleeding was found 25 to 30 cm from the incisors.      A large hiatal hernia was present.      Localized mild inflammation characterized by congestion (edema) and       erythema was found on the greater curvature of the stomach and in the       gastric antrum.      A benign-appearing, intrinsic mild stenosis was found at the pylorus.       This was traversed. A TTS dilator was passed through the scope. Dilation       with a 12-13.5-15 mm pyloric balloon dilator was performed. HELD FOR ONE       MINUTE.      A mild post-ulcer deformity was found in the duodenal  bulb. A TTS       dilator was passed through the scope. Dilation with a 13.5 mm and a 15       mm pyloric balloon dilator was performed. HELD FOR ONE MINUTE.      The second portion of the duodenum was normal. Impression:               - ANEMIA PARTIALLY DUE TO EROSIVE GASTRITIS.                           - Large hiatal hernia-NO CAMREON'S EROSIONS/ULCERS.                           - MILD Gastritis.                           - MILD PYLORIC stenosis was found at the pylorus.                            Dilated.                           - MILD Duodenal WEB.  Dilated. Moderate Sedation:      Per Anesthesia Care Recommendation:           - Return patient to hospital ward for ongoing care.                           - NPO FOR 4 HOURS. ADVANCE TO LOW FAT SOFT                            MECHCNICAL DIET @ 1130. NOTHING RED UNTIL STUDY IS                            COMPLETE.                           - Continue present medications. INCREASE PROTONIX                            TO 40 MG BID. Procedure Code(s):        --- Professional ---                           [phone], Esophagogastroduodenoscopy, flexible,                            transoral; with dilation of gastric/duodenal                            stricture(s) (eg, balloon, bougie) Diagnosis Code(s):        --- Professional ---                           K21.0, Gastro-esophageal reflux disease with                            esophagitis  K44.9, Diaphragmatic hernia without obstruction or                            gangrene                           K29.70, Gastritis, unspecified, without bleeding                           K31.1, Adult hypertrophic pyloric stenosis                           K31.89, Other diseases of stomach and duodenum                           D50.0, Iron deficiency anemia secondary to blood                            loss (chronic) CPT copyright 2019 American Medical Association. All rights reserved. The codes documented in this report are preliminary and upon coder review may  be revised to meet current compliance requirements. Barney Drain, MD Barney Drain MD, MD 05/26/2019 8:03:30 AM This report has been signed electronically. Number of Addenda: 0

## 2019-05-26 NOTE — Transfer of Care (Signed)
Immediate Anesthesia Transfer of Care Note  Patient: Ellissa P Haman  Procedure(s) Performed: ESOPHAGOGASTRODUODENOSCOPY (EGD) WITH PROPOFOL (N/A ) GIVENS CAPSULE STUDY (N/A )  Patient Location: PACU  Anesthesia Type:General  Level of Consciousness: awake, alert , oriented and patient cooperative  Airway & Oxygen Therapy: Patient Spontanous Breathing and Patient connected to nasal cannula oxygen  Post-op Assessment: Report given to RN and Post -op Vital signs reviewed and stable  Post vital signs: Reviewed and stable  Last Vitals:  Vitals Value Taken Time  BP 110/58 05/26/19 0755  Temp    Pulse 92 05/26/19 0756  Resp 12 05/26/19 0756  SpO2 99 % 05/26/19 0756  Vitals shown include unvalidated device data.  Last Pain:  Vitals:   05/26/19 0652  TempSrc: (P) Oral  PainSc: (P) 0-No pain      Patients Stated Pain Goal: 5 ([phone])  Complications: No apparent anesthesia complications

## 2019-05-26 NOTE — Anesthesia Postprocedure Evaluation (Signed)
Anesthesia Post Note  Patient: Alanya P Tussey  Procedure(s) Performed: ESOPHAGOGASTRODUODENOSCOPY (EGD) WITH PROPOFOL (N/A ) GIVENS CAPSULE STUDY (N/A )  Patient location during evaluation: PACU Anesthesia Type: General Level of consciousness: awake and alert and oriented Pain management: pain level controlled Vital Signs Assessment: post-procedure vital signs reviewed and stable Respiratory status: spontaneous breathing Cardiovascular status: stable Postop Assessment: no apparent nausea or vomiting Anesthetic complications: no     Last Vitals:  Vitals:   05/26/19 0502 05/26/19 0652  BP: 108/71 (P) 123/78  Pulse: 100 (P) 90  Resp: 17 (P) 12  Temp: 36.8 C (P) 36.8 C  SpO2: 95% (P) 100%    Last Pain:  Vitals:   05/26/19 [id number]  TempSrc: (P) Oral  PainSc: (P) 0-No pain                 ADAMS, AMY A

## 2019-05-27 DIAGNOSIS — E1165 Type 2 diabetes mellitus with hyperglycemia: Secondary | ICD-10-CM | POA: Diagnosis not present

## 2019-05-27 DIAGNOSIS — E7849 Other hyperlipidemia: Secondary | ICD-10-CM | POA: Diagnosis not present

## 2019-05-27 DIAGNOSIS — E1140 Type 2 diabetes mellitus with diabetic neuropathy, unspecified: Secondary | ICD-10-CM | POA: Diagnosis not present

## 2019-05-27 DIAGNOSIS — I10 Essential (primary) hypertension: Secondary | ICD-10-CM | POA: Diagnosis not present

## 2019-05-27 LAB — GLUCOSE, CAPILLARY
Glucose-Capillary: 123 mg/dL — ABNORMAL HIGH (ref 70–99)
Glucose-Capillary: 157 mg/dL — ABNORMAL HIGH (ref 70–99)
Glucose-Capillary: 169 mg/dL — ABNORMAL HIGH (ref 70–99)
Glucose-Capillary: 196 mg/dL — ABNORMAL HIGH (ref 70–99)

## 2019-05-27 MED ORDER — METHYLPREDNISOLONE SODIUM SUCC 40 MG IJ SOLR: 40.0000 mg | Freq: Every day | INTRAMUSCULAR | Status: DC

## 2019-05-27 NOTE — Progress Notes (Signed)
PROGRESS NOTE  TIONNIE JENNEY  [id number]  DOB: [birth year]  DOA: 05/24/2019 PCP: Redmond School, MD  Brief Admission Hx: 48 y.o. female, w Hypertension, Hyperlipidemia, Dm2, w neuropathy, Gerd, Asthma/ Copd, on oxygen at home, 3 L nasal cannula intermittently, patient presents to ED secondary to complaints of shortness of breath, patient with recent hospitalization secondary to asthma exacerbation, and anemia, status post endoscopy and PRBC transfusion, patient presents to ED secondary to complaints of dyspnea, 1 week, worsening over the last 2 days, as well she reports her phlegm, becoming productive, green in color, she denies any hemoptysis, chest pain, fever or chills, he denies any melena, coffee-ground emesis, nausea or vomiting. - in ED patient was noted to have anemia with hemoglobin of 6.9, as well her chest x-ray significant opacities questionable for pneumonia, she had significant wheezing on presentation, which she did require IV steroids.  MDM/Assessment & Plan:   1. Acute on chronic respiratory failure secondary to acute COPD exacerbation-patient has had multiple recent admissions for similar continue IV steroids, antibiotics and supportive therapy continue bronchodilators.  COVID-19 test is negative.  She is slowly improving continue current therapy. 2. Chronic blood loss anemia-pt s/p 2 units PRBCs, GI placed capsule endoscopy with findings of AVMs, GI arranged transfer [facility] for GI procedure when bed available.  Supportive therapy for now while waiting.  3. Essential hypertension - blood pressures stable.  4. Dyslipidemia- atorvastatin. 5. Type 2 DM with hyperglycemia steroid induced - add prandial coverage, monitor CBG closely.  6. B12 deficiency - IM injections ordered, follow up with PCP.  7. Hypothyroidism - oral replacement ordered.  8. Anx/depression - resume home medications.  9. Pancreatic insufficiency with chronic pancreatitis history - resumed home  pancreatic enzymes.   DVT prophylaxis: SCDs Code Status: Full  Family Communication: patient updated, verbalized understanding Disposition Plan: inpatient treatments, IV steroids, capsule endoscopy   Consultants:  GI  Procedures:    Antimicrobials:  Doxycycline 10/29>>    Subjective: Pt reports improving SOB, no rectal bleeding, no black stools.   Objective: Vitals:   05/27/19 0546 05/27/19 0720 05/27/19 0725 05/27/19 0726  BP: 137/86     Pulse: 88     Resp: 15     Temp:      TempSrc:      SpO2: 100% 96% 96% 96%  Weight: 55.4 kg     Height:        Intake/Output Summary (Last 24 hours) at 05/27/2019 1340 Last data filed at 05/27/2019 0903 Gross per 24 hour  Intake 480 ml  Output -  Net 480 ml   Filed Weights   05/24/19 1450 05/25/19 0649 05/27/19 0546  Weight: 54 kg 55.1 kg 55.4 kg   REVIEW OF SYSTEMS  As per history otherwise all reviewed and reported negative  Exam:  General exam: awake, alert, chronically ill appearing.  Respiratory system: rare wheezing bilateral. Mild increased work of breathing. Cardiovascular system: S1 & S2 heard. No JVD, murmurs, gallops, clicks or pedal edema. Gastrointestinal system: Abdomen is nondistended, soft and nontender. Normal bowel sounds heard. Central nervous system: Alert and oriented. No focal neurological deficits. Extremities: no CCE.  Data Reviewed: Basic Metabolic Panel: Recent Labs  Lab 05/24/19 1618 05/25/19 0549 05/26/19 0633  NA 136 137 137  K 3.7 3.9 4.5  CL 105 104 104  CO2 23 22 26   GLUCOSE 205* 223* 185*  BUN 23* 13 12  CREATININE 0.91 0.58 0.61  CALCIUM 8.4* 8.5* 8.9   Liver Function  Tests: Recent Labs  Lab 05/24/19 1618  AST 19  ALT 15  ALKPHOS 54  BILITOT 0.3  PROT 6.2*  ALBUMIN 3.1*   No results for input(s): LIPASE, AMYLASE in the last 168 hours. No results for input(s): AMMONIA in the last 168 hours. CBC: Recent Labs  Lab 05/24/19 1618 05/25/19 0549 05/26/19 0633   WBC 12.1* 8.8 9.3  NEUTROABS 9.0*  --  8.2*  HGB 6.9* 10.1* 10.3*  HCT 23.5* 32.3* 33.0*  MCV 88.0 89.2 90.4  PLT 242 194 224   Cardiac Enzymes: No results for input(s): CKTOTAL, CKMB, CKMBINDEX, TROPONINI in the last 168 hours. CBG (last 3)  Recent Labs    05/26/19 2114 05/27/19 0739 05/27/19 1119  GLUCAP 160* 123* 157*   Recent Results (from the past 240 hour(s))  SARS CORONAVIRUS 2 (TAT 6-24 HRS) Nasopharyngeal Nasopharyngeal Swab     Status: None   Collection Time: 05/24/19  6:00 PM   Specimen: Nasopharyngeal Swab  Result Value Ref Range Status   SARS Coronavirus 2 NEGATIVE NEGATIVE Final    Comment: (NOTE) SARS-CoV-2 target nucleic acids are NOT DETECTED. The SARS-CoV-2 RNA is generally detectable in upper and lower respiratory specimens during the acute phase of infection. Negative results do not preclude SARS-CoV-2 infection, do not rule out co-infections with other pathogens, and should not be used as the sole basis for treatment or other patient management decisions. Negative results must be combined with clinical observations, patient history, and epidemiological information. The expected result is Negative. Fact Sheet for Patients: SugarRoll.be Fact Sheet for Healthcare Providers: https://www.woods-mathews.com/ This test is not yet approved or cleared by the [place] FDA and  has been authorized for detection and/or diagnosis of SARS-CoV-2 by FDA under an Emergency Use Authorization (EUA). This EUA will remain  in effect (meaning this test can be used) for the duration of the COVID-19 declaration under Section 56 4(b)(1) of the Act, 21 U.S.C. section 360bbb-3(b)(1), unless the authorization is terminated or revoked sooner. Performed at [facility], [place] [address] [place], [place] [address]   MRSA PCR Screening     Status: None   Collection Time: 05/24/19  6:32 PM   Specimen: Nasal Mucosa;  Nasopharyngeal  Result Value Ref Range Status   MRSA by PCR NEGATIVE NEGATIVE Final    Comment:        The GeneXpert MRSA Assay (FDA approved for NASAL specimens only), is one component of a comprehensive MRSA colonization surveillance program. It is not intended to diagnose MRSA infection nor to guide or monitor treatment for MRSA infections. Performed at [facility], [address] [place], [place] [address]     Studies: No results found. Scheduled Meds: . albuterol  2.5 mg Nebulization TID  . atorvastatin  20 mg Oral Daily  . cyanocobalamin  1,000 mcg Intramuscular Weekly  . doxycycline  100 mg Oral Q12H  . feeding supplement  1 Container Oral TID BM  . fluticasone furoate-vilanterol  1 puff Inhalation Daily  . gabapentin  300 mg Oral TID  . insulin aspart  0-15 Units Subcutaneous TID WC  . insulin aspart  0-5 Units Subcutaneous QHS  . levothyroxine  25 mcg Oral QAC breakfast  . lipase/protease/amylase  24,000 Units Oral With snacks  . lipase/protease/amylase  36,000 Units Oral TID WC  . lisinopril  2.5 mg Oral Daily  . methylPREDNISolone (SOLU-MEDROL) injection  40 mg Intravenous Q12H  . multivitamin with minerals  1 tablet Oral Daily  . pantoprazole (PROTONIX)  IV  40 mg Intravenous BID AC  . umeclidinium bromide  1 puff Inhalation Daily  . valACYclovir  500 mg Oral Daily   Continuous Infusions:  Active Problems:   ANEMIA, IRON DEFICIENCY   Essential hypertension   GERD   Asthma exacerbation   CAP (community acquired pneumonia)   COPD exacerbation ([place])   DM type 2 (diabetes mellitus, type 2) ([place])   Diabetic neuropathy ([place])   Vitamin B12 deficiency   Symptomatic anemia  Time spent:   Irwin Brakeman, MD [facility] 05/27/2019, 1:40 PM    LOS: 3 days  How to contact the [facility] Attending or Consulting provider [place] or covering provider during after hours [place], for this patient?  1. Check the care team in [facility] and look for a)  attending/consulting TRH provider listed and b) the [facility] team listed 2. Log into [email or web address] and use [place]'s universal password to access. If you do not have the password, please contact the hospital operator. 3. Locate the [facility] provider you are looking for under [facility] and page to a number that you can be directly reached. 4. If you still have difficulty reaching the provider, please page the [facility] (Director on Call) for the Hospitalists listed on amion for assistance.

## 2019-05-27 NOTE — Progress Notes (Signed)
[facility] called to say as soon as a bed is available pt can be transferred. No bed available at this time.

## 2019-05-28 LAB — CBC
HCT: 33.8 % — ABNORMAL LOW (ref 36.0–46.0)
Hemoglobin: 10.3 g/dL — ABNORMAL LOW (ref 12.0–15.0)
MCH: 27.8 pg (ref 26.0–34.0)
MCHC: 30.5 g/dL (ref 30.0–36.0)
MCV: 91.4 fL (ref 80.0–100.0)
Platelets: 291 10*3/uL (ref 150–400)
RBC: 3.7 MIL/uL — ABNORMAL LOW (ref 3.87–5.11)
RDW: 20.5 % — ABNORMAL HIGH (ref 11.5–15.5)
WBC: 10.8 10*3/uL — ABNORMAL HIGH (ref 4.0–10.5)
nRBC: 0 % (ref 0.0–0.2)

## 2019-05-28 LAB — GLUCOSE, CAPILLARY
Glucose-Capillary: 129 mg/dL — ABNORMAL HIGH (ref 70–99)
Glucose-Capillary: 303 mg/dL — ABNORMAL HIGH (ref 70–99)
Glucose-Capillary: 155 mg/dL — ABNORMAL HIGH (ref 70–99)
Glucose-Capillary: 155 mg/dL — ABNORMAL HIGH (ref 70–99)

## 2019-05-28 MED ORDER — METHYLPREDNISOLONE SODIUM SUCC 40 MG IJ SOLR
20.0000 mg | Freq: Every day | INTRAMUSCULAR | Status: DC
  Administered 2019-05-28: 08:00:00 via INTRAVENOUS
  Administered 2019-05-29: 20 mg via INTRAVENOUS
  Filled 2019-05-28 (×2): qty 1

## 2019-05-28 NOTE — Progress Notes (Signed)
PROGRESS NOTE  [name]  [id number]  DOB: [birth year]  DOA: 05/24/2019 PCP: Redmond School, MD  Brief Admission Hx: 48 y.o. female, w Hypertension, Hyperlipidemia, Dm2, w neuropathy, Gerd, Asthma/ Copd, on oxygen at home, 3 L nasal cannula intermittently, patient presents to ED secondary to complaints of shortness of breath, patient with recent hospitalization secondary to asthma exacerbation, and anemia, status post endoscopy and PRBC transfusion, patient presents to ED secondary to complaints of dyspnea, 1 week, worsening over the last 2 days, as well she reports her phlegm, becoming productive, green in color, she denies any hemoptysis, chest pain, fever or chills, he denies any melena, coffee-ground emesis, nausea or vomiting. - in ED patient was noted to have anemia with hemoglobin of 6.9, as well her chest x-ray significant opacities questionable for pneumonia, she had significant wheezing on presentation, which she did require IV steroids.  MDM/Assessment & Plan:   1. Acute on chronic respiratory failure secondary to acute COPD exacerbation-patient has had multiple recent admissions for similar continue IV steroids, antibiotics and supportive therapy continue bronchodilators.  COVID-19 test is negative.  She is slowly improving continue current therapy. 2. Chronic blood loss anemia-pt s/p 2 units PRBCs, GI placed capsule endoscopy with findings of AVMs, GI arranged transfer to [facility] for GI procedure when bed available.  Supportive therapy for now while waiting.  3. Essential hypertension - blood pressures stable.  4. Dyslipidemia- atorvastatin. 5. Type 2 DM with hyperglycemia steroid induced - add prandial coverage, monitor CBG closely.  6. B12 deficiency - IM injections ordered, follow up with PCP.  7. Hypothyroidism - oral replacement ordered.  8. Anx/depression - resume home medications.  9. Pancreatic insufficiency with chronic pancreatitis history - resumed home  pancreatic enzymes.   DVT prophylaxis: SCDs Code Status: Full  Family Communication: patient updated, verbalized understanding Disposition Plan: inpatient treatments, IV steroids, capsule endoscopy   Consultants:  GI  Procedures:    Antimicrobials:  Doxycycline 10/29>>    Subjective: Pt reports she doesn't feel as good today, her breathing is getting better but she remains weak. No rectal bleeding or black stools.  Objective: Vitals:   05/28/19 0739 05/28/19 0748 05/28/19 0749 05/28/19 1312  BP:      Pulse:      Resp:      Temp:      TempSrc:      SpO2: 93% 93% 93% 94%  Weight:      Height:        Intake/Output Summary (Last 24 hours) at 05/28/2019 1313 Last data filed at 05/27/2019 1839 Gross per 24 hour  Intake 240 ml  Output -  Net 240 ml   Filed Weights   05/24/19 1450 05/25/19 0649 05/27/19 0546  Weight: 54 kg 55.1 kg 55.4 kg   REVIEW OF SYSTEMS  As per history otherwise all reviewed and reported negative  Exam:  General exam: awake, alert, chronically ill appearing.  Respiratory system: rare wheezing bilateral. Mild increased work of breathing. Cardiovascular system: S1 & S2 heard. No JVD, murmurs, gallops, clicks or pedal edema. Gastrointestinal system: Abdomen is nondistended, soft and nontender. Normal bowel sounds heard. Central nervous system: Alert and oriented. No focal neurological deficits. Extremities: no CCE.  Data Reviewed: Basic Metabolic Panel: Recent Labs  Lab 05/24/19 1618 05/25/19 0549 05/26/19 0633  NA 136 137 137  K 3.7 3.9 4.5  CL 105 104 104  CO2 23 22 26   GLUCOSE 205* 223* 185*  BUN 23* 13 12  CREATININE 0.91  0.58 0.61  CALCIUM 8.4* 8.5* 8.9   Liver Function Tests: Recent Labs  Lab 05/24/19 1618  AST 19  ALT 15  ALKPHOS 54  BILITOT 0.3  PROT 6.2*  ALBUMIN 3.1*   No results for input(s): LIPASE, AMYLASE in the last 168 hours. No results for input(s): AMMONIA in the last 168 hours. CBC: Recent Labs   Lab 05/24/19 1618 05/25/19 0549 05/26/19 0633 05/28/19 0750  WBC 12.1* 8.8 9.3 10.8*  NEUTROABS 9.0*  --  8.2*  --   HGB 6.9* 10.1* 10.3* 10.3*  HCT 23.5* 32.3* 33.0* 33.8*  MCV 88.0 89.2 90.4 91.4  PLT 242 194 224 291   Cardiac Enzymes: No results for input(s): CKTOTAL, CKMB, CKMBINDEX, TROPONINI in the last 168 hours. CBG (last 3)  Recent Labs    05/27/19 2109 05/28/19 0750 05/28/19 1138  GLUCAP 196* 129* 303*   Recent Results (from the past 240 hour(s))  SARS CORONAVIRUS 2 (TAT 6-24 HRS) Nasopharyngeal Nasopharyngeal Swab     Status: None   Collection Time: 05/24/19  6:00 PM   Specimen: Nasopharyngeal Swab  Result Value Ref Range Status   SARS Coronavirus 2 NEGATIVE NEGATIVE Final    Comment: (NOTE) SARS-CoV-2 target nucleic acids are NOT DETECTED. The SARS-CoV-2 RNA is generally detectable in upper and lower respiratory specimens during the acute phase of infection. Negative results do not preclude SARS-CoV-2 infection, do not rule out co-infections with other pathogens, and should not be used as the sole basis for treatment or other patient management decisions. Negative results must be combined with clinical observations, patient history, and epidemiological information. The expected result is Negative. Fact Sheet for Patients: SugarRoll.be Fact Sheet for Healthcare Providers: https://www.woods-mathews.com/ This test is not yet approved or cleared by the [place] FDA and  has been authorized for detection and/or diagnosis of SARS-CoV-2 by FDA under an Emergency Use Authorization (EUA). This EUA will remain  in effect (meaning this test can be used) for the duration of the COVID-19 declaration under Section 56 4(b)(1) of the Act, 21 U.S.C. section 360bbb-3(b)(1), unless the authorization is terminated or revoked sooner. Performed at [facility], [place] [address], [place], [place] [address]   MRSA PCR  Screening     Status: None   Collection Time: 05/24/19  6:32 PM   Specimen: Nasal Mucosa; Nasopharyngeal  Result Value Ref Range Status   MRSA by PCR NEGATIVE NEGATIVE Final    Comment:        The GeneXpert MRSA Assay (FDA approved for NASAL specimens only), is one component of a comprehensive MRSA colonization surveillance program. It is not intended to diagnose MRSA infection nor to guide or monitor treatment for MRSA infections. Performed at [facility], [address], [place], [place] [address]     Studies: No results found. Scheduled Meds: . albuterol  2.5 mg Nebulization TID  . atorvastatin  20 mg Oral Daily  . cyanocobalamin  1,000 mcg Intramuscular Weekly  . doxycycline  100 mg Oral Q12H  . feeding supplement  1 Container Oral TID BM  . fluticasone furoate-vilanterol  1 puff Inhalation Daily  . gabapentin  300 mg Oral TID  . insulin aspart  0-15 Units Subcutaneous TID WC  . insulin aspart  0-5 Units Subcutaneous QHS  . levothyroxine  25 mcg Oral QAC breakfast  . lipase/protease/amylase  24,000 Units Oral With snacks  . lipase/protease/amylase  36,000 Units Oral TID WC  . lisinopril  2.5 mg Oral Daily  . methylPREDNISolone (  SOLU-MEDROL) injection  20 mg Intravenous Daily  . multivitamin with minerals  1 tablet Oral Daily  . pantoprazole (PROTONIX) IV  40 mg Intravenous BID AC  . umeclidinium bromide  1 puff Inhalation Daily  . valACYclovir  500 mg Oral Daily   Continuous Infusions:  Active Problems:   ANEMIA, IRON DEFICIENCY   Essential hypertension   GERD   Asthma exacerbation   CAP (community acquired pneumonia)   COPD exacerbation ([place])   DM type 2 (diabetes mellitus, type 2) ([place])   Diabetic neuropathy ([place])   Vitamin B12 deficiency   Symptomatic anemia  Time spent:   Irwin Brakeman, MD [facility] 05/28/2019, 1:13 PM    LOS: 4 days  How to contact the [facility] [place] [facility] Attending or Consulting provider [place] or covering provider during after hours  [place], for this patient?  1. Check the care team in [facility] and look for a) attending/consulting TRH provider listed and b) the [facility] team listed 2. Log into [email or web address] and use [place]'s universal password to access. If you do not have the password, please contact the hospital operator. 3. Locate the [place] provider you are looking for under [facility] and page to a number that you can be directly reached. 4. If you still have difficulty reaching the provider, please page the [facility] (Director on Call) for the Hospitalists listed on amion for assistance.

## 2019-05-29 ENCOUNTER — Encounter (HOSPITAL_COMMUNITY): Payer: Self-pay | Admitting: Radiology

## 2019-05-29 ENCOUNTER — Ambulatory Visit: Payer: Self-pay | Admitting: *Deleted

## 2019-05-29 ENCOUNTER — Inpatient Hospital Stay (HOSPITAL_COMMUNITY): Payer: Medicare Other

## 2019-05-29 DIAGNOSIS — Q2730 Arteriovenous malformation, site unspecified: Secondary | ICD-10-CM

## 2019-05-29 LAB — GLUCOSE, CAPILLARY
Glucose-Capillary: 134 mg/dL — ABNORMAL HIGH (ref 70–99)
Glucose-Capillary: 140 mg/dL — ABNORMAL HIGH (ref 70–99)
Glucose-Capillary: 238 mg/dL — ABNORMAL HIGH (ref 70–99)
Glucose-Capillary: 156 mg/dL — ABNORMAL HIGH (ref 70–99)

## 2019-05-29 MED ORDER — POLYETHYLENE GLYCOL 3350 17 G PO PACK
17.0000 g | PACK | Freq: Every day | ORAL | Status: DC
  Administered 2019-05-29: 17 g via ORAL
  Filled 2019-05-29: qty 1

## 2019-05-29 MED ORDER — ACETAMINOPHEN 325 MG PO TABS
650.0000 mg | ORAL_TABLET | Freq: Four times a day (QID) | ORAL | Status: DC | PRN
  Administered 2019-05-29: 650 mg via ORAL

## 2019-05-29 MED ORDER — DOCUSATE SODIUM 100 MG PO CAPS
100.0000 mg | ORAL_CAPSULE | Freq: Two times a day (BID) | ORAL | Status: DC
  Administered 2019-05-29 (×2): 100 mg via ORAL
  Filled 2019-05-29 (×2): qty 1

## 2019-05-29 NOTE — Progress Notes (Signed)
    Subjective: Lower abdominal cramping, chronic. Last BM Thursday. Some nausea. Worst symptom is headache. No overt GI bleeding.   Objective: Vital signs in last 24 hours: Temp:  [97.9 F (36.6 C)-99.3 F (37.4 C)] 97.9 F (36.6 C) (11/02 0530) Pulse Rate:  [82-105] 82 (11/02 0530) Resp:  [18] 18 (11/02 0530) BP: (129-133)/(80-90) 131/90 (11/02 0530) SpO2:  [93 %-95 %] 94 % (11/02 0838) Last BM Date: 05/22/19 General:   Alert and oriented, pleasant Abdomen:  Bowel sounds present, soft, mild TTP lower abdomen. Sitting up in chair.  Msk:  Symmetrical without gross deformities. Normal posture. Extremities:  Without  edema. Neurologic:  Alert and  oriented x4 Psych:  Alert and cooperative. Normal mood and affect.  Intake/Output from previous day: 11/01 0701 - 11/02 0700 In: 720 [P.O.:720] Out: -  Intake/Output this shift: No intake/output data recorded.  Lab Results: Recent Labs    05/28/19 0750  WBC 10.8*  HGB 10.3*  HCT 33.8*  PLT 291    Assessment: 48 year old female with history of transfusion dependent anemia, IDA, without overt GI bleeding, s/p EGD with erosive gastritis, large hiatal hernia, mild pyloric stenosis s/p dilation, mild duodenal web s/p dilation, with capsule placed, noting bright red blood/AVMs in duodenum. Capsule did not reach cecum. Plans per documentation in epic were for enteroscopy at [facility], with hopeful transfer today. However, no beds available as of this morning. Hgb stable, no overt GI bleeding.  Constipation: in setting of chronic opioid therapy. Notes stool softeners have been helpful in past. Will start stool softeners and add Miralax. Recommend agent such as Movantik going forward at discharge. Failed Linzess in past.   Chronic pancreatitis: stable. Continue pancreatic enzymes.   Plan: Awaiting bed at [facility] for overt bleeding Stool softener BID, Miralax daily Abdominal xray today to assess capsule location as did  not reach the cecum   Annitta Needs, PhD, ANP-BC [facility]    LOS: 5 days    05/29/2019, 9:09 AM

## 2019-05-29 NOTE — Progress Notes (Signed)
PROGRESS NOTE  [name]  [id number]  DOB: [birth year]  DOA: 05/24/2019 PCP: Redmond School, MD  Brief Admission Hx: 48 y.o. female, w Hypertension, Hyperlipidemia, Dm2, w neuropathy, Gerd, Asthma/ Copd, on oxygen at home, 3 L nasal cannula intermittently, patient presents to ED secondary to complaints of shortness of breath, patient with recent hospitalization secondary to asthma exacerbation, and anemia, status post endoscopy and PRBC transfusion, patient presents to ED secondary to complaints of dyspnea, 1 week, worsening over the last 2 days, as well she reports her phlegm, becoming productive, green in color, she denies any hemoptysis, chest pain, fever or chills, he denies any melena, coffee-ground emesis, nausea or vomiting. - in ED patient was noted to have anemia with hemoglobin of 6.9, as well her chest x-ray significant opacities questionable for pneumonia, she had significant wheezing on presentation, which she did require IV steroids.  MDM/Assessment & Plan:   1. Acute on chronic respiratory failure secondary to acute COPD exacerbation-patient has had multiple recent admissions for similar continue IV steroids, antibiotics and supportive therapy continue bronchodilators.  COVID-19 test is negative.  She is slowly improving continue current therapy. She says that she does feel better today.  2. Chronic blood loss anemia-pt s/p 2 units PRBCs, GI placed capsule endoscopy with findings of AVMs, GI arranged transfer to [facility] for GI procedure when bed available.  Supportive therapy for now while waiting. GI planning abd xray to assess capsule location today.  3. Essential hypertension - blood pressures stable.  4. Dyslipidemia- atorvastatin. 5. Type 2 DM with hyperglycemia steroid induced - added prandial coverage, monitor CBG closely. Well controlled.  6. B12 deficiency - IM injections ordered, follow up with PCP for further outpatient injections.  7. Hypothyroidism - oral  replacement ordered.  8. Anx/depression - resume home medications.  9. Pancreatic insufficiency with chronic pancreatitis history - resumed home pancreatic enzymes.   DVT prophylaxis: SCDs Code Status: Full  Family Communication: patient updated, verbalized understanding Disposition Plan: awaiting bed for transfer to [facility] for GI procedures  Consultants:  GI  Procedures:    Antimicrobials:  Doxycycline 10/29>>    Subjective: Pt reports feels better today and walking around in the room.  No stools. GI ordered laxatives.   Objective: Vitals:   05/28/19 2205 05/29/19 0530 05/29/19 0837 05/29/19 0838  BP: 129/83 131/90    Pulse: 99 82    Resp:  18    Temp:  97.9 F (36.6 C)    TempSrc:      SpO2: 94% 93% 94% 94%  Weight:      Height:        Intake/Output Summary (Last 24 hours) at 05/29/2019 1408 Last data filed at 05/28/2019 1836 Gross per 24 hour  Intake 240 ml  Output -  Net 240 ml   Filed Weights   05/24/19 1450 05/25/19 0649 05/27/19 0546  Weight: 54 kg 55.1 kg 55.4 kg   REVIEW OF SYSTEMS  As per history otherwise all reviewed and reported negative  Exam:  General exam: awake, alert, chronically ill appearing.  Respiratory system: rare wheezing bilateral. Mild increased work of breathing. Cardiovascular system: S1 & S2 heard. No JVD, murmurs, gallops, clicks or pedal edema. Gastrointestinal system: Abdomen is nondistended, soft and nontender. Normal bowel sounds heard. Central nervous system: Alert and oriented. No focal neurological deficits. Extremities: no CCE.  Data Reviewed: Basic Metabolic Panel: Recent Labs  Lab 05/24/19 1618 05/25/19 0549 05/26/19 0633  NA 136 137 137  K 3.7  3.9 4.5  CL 105 104 104  CO2 23 22 26   GLUCOSE 205* 223* 185*  BUN 23* 13 12  CREATININE 0.91 0.58 0.61  CALCIUM 8.4* 8.5* 8.9   Liver Function Tests: Recent Labs  Lab 05/24/19 1618  AST 19  ALT 15  ALKPHOS 54  BILITOT 0.3  PROT 6.2*  ALBUMIN  3.1*   No results for input(s): LIPASE, AMYLASE in the last 168 hours. No results for input(s): AMMONIA in the last 168 hours. CBC: Recent Labs  Lab 05/24/19 1618 05/25/19 0549 05/26/19 0633 05/28/19 0750  WBC 12.1* 8.8 9.3 10.8*  NEUTROABS 9.0*  --  8.2*  --   HGB 6.9* 10.1* 10.3* 10.3*  HCT 23.5* 32.3* 33.0* 33.8*  MCV 88.0 89.2 90.4 91.4  PLT 242 194 224 291   Cardiac Enzymes: No results for input(s): CKTOTAL, CKMB, CKMBINDEX, TROPONINI in the last 168 hours. CBG (last 3)  Recent Labs    05/28/19 2207 05/29/19 0735 05/29/19 1119  GLUCAP 155* 134* 140*   Recent Results (from the past 240 hour(s))  SARS CORONAVIRUS 2 (TAT 6-24 HRS) Nasopharyngeal Nasopharyngeal Swab     Status: None   Collection Time: 05/24/19  6:00 PM   Specimen: Nasopharyngeal Swab  Result Value Ref Range Status   SARS Coronavirus 2 NEGATIVE NEGATIVE Final    Comment: (NOTE) SARS-CoV-2 target nucleic acids are NOT DETECTED. The SARS-CoV-2 RNA is generally detectable in upper and lower respiratory specimens during the acute phase of infection. Negative results do not preclude SARS-CoV-2 infection, do not rule out co-infections with other pathogens, and should not be used as the sole basis for treatment or other patient management decisions. Negative results must be combined with clinical observations, patient history, and epidemiological information. The expected result is Negative. Fact Sheet for Patients: SugarRoll.be Fact Sheet for Healthcare Providers: https://www.woods-mathews.com/ This test is not yet approved or cleared by the [place] FDA and  has been authorized for detection and/or diagnosis of SARS-CoV-2 by FDA under an Emergency Use Authorization (EUA). This EUA will remain  in effect (meaning this test can be used) for the duration of the COVID-19 declaration under Section 56 4(b)(1) of the Act, 21 U.S.C. section 360bbb-3(b)(1), unless  the authorization is terminated or revoked sooner. Performed at [facility], [place] [address] [place], [place] [address]   MRSA PCR Screening     Status: None   Collection Time: 05/24/19  6:32 PM   Specimen: Nasal Mucosa; Nasopharyngeal  Result Value Ref Range Status   MRSA by PCR NEGATIVE NEGATIVE Final    Comment:        The GeneXpert MRSA Assay (FDA approved for NASAL specimens only), is one component of a comprehensive MRSA colonization surveillance program. It is not intended to diagnose MRSA infection nor to guide or monitor treatment for MRSA infections. Performed at [facility], [address], [place], [place] [address]     Studies: Dg Abd 2 Views  Result Date: 05/29/2019 CLINICAL DATA:  Abdominal pain.  Capsule study last week. EXAM: ABDOMEN - 2 VIEW COMPARISON:  Abdominal x-ray dated February 03, 2019. FINDINGS: The bowel gas pattern is normal. There is no evidence of free air. Moderate amount of stool throughout the colon. Capsule in the central abdomen. Unchanged moderate hiatal hernia. No radio-opaque calculi or other significant radiographic abnormality is seen. No acute osseous abnormality. IMPRESSION: 1. Capsule in the central abdomen. Consider lateral view or CT to assess whether it is in the small  bowel or transverse colon. 2. No acute findings.  Moderate colonic stool burden. 3. Unchanged moderate hiatal hernia. Electronically Signed   By: Titus Dubin M.D.   On: 05/29/2019 12:43   Scheduled Meds: . atorvastatin  20 mg Oral Daily  . cyanocobalamin  1,000 mcg Intramuscular Weekly  . docusate sodium  100 mg Oral BID  . doxycycline  100 mg Oral Q12H  . feeding supplement  1 Container Oral TID BM  . fluticasone furoate-vilanterol  1 puff Inhalation Daily  . gabapentin  300 mg Oral TID  . insulin aspart  0-15 Units Subcutaneous TID WC  . insulin aspart  0-5 Units Subcutaneous QHS  . levothyroxine  25 mcg Oral QAC breakfast  . lipase/protease/amylase  24,000  Units Oral With snacks  . lipase/protease/amylase  36,000 Units Oral TID WC  . lisinopril  2.5 mg Oral Daily  . methylPREDNISolone (SOLU-MEDROL) injection  20 mg Intravenous Daily  . multivitamin with minerals  1 tablet Oral Daily  . pantoprazole (PROTONIX) IV  40 mg Intravenous BID AC  . polyethylene glycol  17 g Oral Daily  . umeclidinium bromide  1 puff Inhalation Daily  . valACYclovir  500 mg Oral Daily   Continuous Infusions:  Active Problems:   ANEMIA, IRON DEFICIENCY   Essential hypertension   GERD   Asthma exacerbation   CAP (community acquired pneumonia)   COPD exacerbation ([place])   DM type 2 (diabetes mellitus, type 2) ([place])   Diabetic neuropathy ([place])   Vitamin B12 deficiency   Symptomatic anemia  Time spent:   Irwin Brakeman, MD [facility] 05/29/2019, 2:08 PM    LOS: 5 days  How to contact the [facility] Attending or Consulting provider [place] or covering provider during after hours [place], for this patient?  1. Check the care team in [facility] and look for a) attending/consulting TRH provider listed and b) the [facility] team listed 2. Log into [email or web address] and use [place]'s universal password to access. If you do not have the password, please contact the hospital operator. 3. Locate the [facility] provider you are looking for under [facility] and page to a number that you can be directly reached. 4. If you still have difficulty reaching the provider, please page the [facility] (Director on Call) for the Hospitalists listed on amion for assistance.

## 2019-05-29 NOTE — Care Management Important Message (Signed)
Important Message  Patient Details  Name: [name] MRN: [id number] Date of Birth: 1970-12-05   Medicare Important Message Given:  Yes     Tommy Medal 05/29/2019, 1:15 PM

## 2019-05-29 NOTE — Progress Notes (Signed)
[facility] called and there are still no beds available at this time. They will call when there is a bed available at their facility.

## 2019-05-30 ENCOUNTER — Encounter: Payer: Self-pay | Admitting: Internal Medicine

## 2019-05-30 ENCOUNTER — Inpatient Hospital Stay (HOSPITAL_COMMUNITY): Payer: Medicare Other

## 2019-05-30 DIAGNOSIS — K5900 Constipation, unspecified: Secondary | ICD-10-CM

## 2019-05-30 DIAGNOSIS — K219 Gastro-esophageal reflux disease without esophagitis: Secondary | ICD-10-CM

## 2019-05-30 LAB — CBC WITH DIFFERENTIAL/PLATELET
Abs Immature Granulocytes: 0.07 10*3/uL (ref 0.00–0.07)
Basophils Absolute: 0 10*3/uL (ref 0.0–0.1)
Basophils Relative: 0 %
Eosinophils Absolute: 0.6 10*3/uL — ABNORMAL HIGH (ref 0.0–0.5)
Eosinophils Relative: 6 %
HCT: 34.1 % — ABNORMAL LOW (ref 36.0–46.0)
Hemoglobin: 10.7 g/dL — ABNORMAL LOW (ref 12.0–15.0)
Immature Granulocytes: 1 %
Lymphocytes Relative: 21 %
Lymphs Abs: 2.1 10*3/uL (ref 0.7–4.0)
MCH: 28.1 pg (ref 26.0–34.0)
MCHC: 31.4 g/dL (ref 30.0–36.0)
MCV: 89.5 fL (ref 80.0–100.0)
Monocytes Absolute: 0.7 10*3/uL (ref 0.1–1.0)
Monocytes Relative: 7 %
Neutro Abs: 6.8 10*3/uL (ref 1.7–7.7)
Neutrophils Relative %: 65 %
Platelets: 347 10*3/uL (ref 150–400)
RBC: 3.81 MIL/uL — ABNORMAL LOW (ref 3.87–5.11)
RDW: 20.6 % — ABNORMAL HIGH (ref 11.5–15.5)
WBC: 10.2 10*3/uL (ref 4.0–10.5)
nRBC: 0 % (ref 0.0–0.2)

## 2019-05-30 LAB — GLUCOSE, CAPILLARY
Glucose-Capillary: 121 mg/dL — ABNORMAL HIGH (ref 70–99)
Glucose-Capillary: 177 mg/dL — ABNORMAL HIGH (ref 70–99)
Glucose-Capillary: 147 mg/dL — ABNORMAL HIGH (ref 70–99)
Glucose-Capillary: 95 mg/dL (ref 70–99)

## 2019-05-30 MED ORDER — PEG 3350-KCL-NA BICARB-NACL 420 G PO SOLR
2000.0000 mL | Freq: Once | ORAL | Status: AC
  Administered 2019-05-30: 2000 mL via ORAL

## 2019-05-30 MED ORDER — LINACLOTIDE 145 MCG PO CAPS: 290.0000 ug | ORAL_CAPSULE | Freq: Every day | ORAL | Status: DC

## 2019-05-30 NOTE — Progress Notes (Signed)
Arbie Cookey, transfer nurse from [facility], called and said they are full and still do not have a bed to offer Mrs. Gayden. However, they would keep her on their list and if she should be transferred to another facility to please let them know.

## 2019-05-30 NOTE — Progress Notes (Signed)
PROGRESS NOTE  [name]  [id number]  DOB: 02/08/1971  DOA: 05/24/2019 PCP: Redmond School, MD  Brief Admission Hx: 48 y.o. female, w Hypertension, Hyperlipidemia, Dm2, w neuropathy, Gerd, Asthma/ Copd, on oxygen at home, 3 L nasal cannula intermittently, patient presents to ED secondary to complaints of shortness of breath, patient with recent hospitalization secondary to asthma exacerbation, and anemia, status post endoscopy and PRBC transfusion, patient presents to ED secondary to complaints of dyspnea, 1 week, worsening over the last 2 days, as well she reports her phlegm, becoming productive, green in color, she denies any hemoptysis, chest pain, fever or chills, he denies any melena, coffee-ground emesis, nausea or vomiting. - in ED patient was noted to have anemia with hemoglobin of 6.9, as well her chest x-ray significant opacities questionable for pneumonia, she had significant wheezing on presentation, which she did require IV steroids.  MDM/Assessment & Plan:   1. Acute on chronic respiratory failure secondary to acute COPD exacerbation-patient has had multiple recent admissions for similar continue IV steroids, antibiotics and supportive therapy continue bronchodilators.  COVID-19 test is negative.  She is slowly improving continue current therapy.   2. Chronic blood loss anemia-pt s/p 2 units PRBCs, GI placed capsule endoscopy with findings of AVMs, GI arranged transfer to [facility] for GI procedure when bed available.  Supportive therapy for now while waiting. GI planning abd xray in AM to assess capsule location.  3. Essential hypertension - blood pressures stable.  4. Dyslipidemia- atorvastatin. 5. Type 2 DM with hyperglycemia steroid induced - added prandial coverage, monitor CBG closely. Well controlled.  6. B12 deficiency - IM injections ordered, follow up with PCP for further outpatient injections.  7. Hypothyroidism - oral replacement ordered.  8. Anx/depression -  resume home medications.  9. Pancreatic insufficiency with chronic pancreatitis history - resumed home pancreatic enzymes.  10. Chronic constipation - GI ordered Linzess today.     DVT prophylaxis: SCDs Code Status: Full  Family Communication: patient updated, verbalized understanding Disposition Plan: awaiting bed for transfer to [facility] for GI procedures (enteroscopy)  Consultants:  GI  Procedures:    Antimicrobials:  Doxycycline 10/29>>    Subjective: Pt reports no BM feeling some abdominal discomfort.   Objective: Vitals:   05/29/19 0838 05/29/19 1537 05/29/19 2019 05/30/19 0536  BP:  123/77  128/83  Pulse:  (!) 102  82  Resp:  20  18  Temp:    98.4 F (36.9 C)  TempSrc:    Oral  SpO2: 94% 95% 95% 98%  Weight:      Height:        Intake/Output Summary (Last 24 hours) at 05/30/2019 1241 Last data filed at 05/30/2019 0900 Gross per 24 hour  Intake 300 ml  Output -  Net 300 ml   Filed Weights   05/24/19 1450 05/25/19 0649 05/27/19 0546  Weight: 54 kg 55.1 kg 55.4 kg   REVIEW OF SYSTEMS  As per history otherwise all reviewed and reported negative  Exam:  General exam: awake, alert, chronically ill appearing.  Respiratory system: rare wheezing bilateral. Mild increased work of breathing. Cardiovascular system: S1 & S2 heard. No JVD, murmurs, gallops, clicks or pedal edema. Gastrointestinal system: Abdomen is nondistended, soft and nontender. Normal bowel sounds heard. Central nervous system: Alert and oriented. No focal neurological deficits. Extremities: no CCE.  Data Reviewed: Basic Metabolic Panel: Recent Labs  Lab 05/24/19 1618 05/25/19 0549 05/26/19 0633  NA 136 137 137  K 3.7 3.9 4.5  CL 105 104 104  CO2 23 22 26   GLUCOSE 205* 223* 185*  BUN 23* 13 12  CREATININE 0.91 0.58 0.61  CALCIUM 8.4* 8.5* 8.9   Liver Function Tests: Recent Labs  Lab 05/24/19 1618  AST 19  ALT 15  ALKPHOS 54  BILITOT 0.3  PROT 6.2*  ALBUMIN 3.1*    No results for input(s): LIPASE, AMYLASE in the last 168 hours. No results for input(s): AMMONIA in the last 168 hours. CBC: Recent Labs  Lab 05/24/19 1618 05/25/19 0549 05/26/19 0633 05/28/19 0750 05/30/19 1001  WBC 12.1* 8.8 9.3 10.8* 10.2  NEUTROABS 9.0*  --  8.2*  --  6.8  HGB 6.9* 10.1* 10.3* 10.3* 10.7*  HCT 23.5* 32.3* 33.0* 33.8* 34.1*  MCV 88.0 89.2 90.4 91.4 89.5  PLT 242 194 224 291 347   Cardiac Enzymes: No results for input(s): CKTOTAL, CKMB, CKMBINDEX, TROPONINI in the last 168 hours. CBG (last 3)  Recent Labs    05/29/19 2030 05/30/19 0718 05/30/19 1142  GLUCAP 156* 121* 177*   Recent Results (from the past 240 hour(s))  SARS CORONAVIRUS 2 (TAT 6-24 HRS) Nasopharyngeal Nasopharyngeal Swab     Status: None   Collection Time: 05/24/19  6:00 PM   Specimen: Nasopharyngeal Swab  Result Value Ref Range Status   SARS Coronavirus 2 NEGATIVE NEGATIVE Final    Comment: (NOTE) SARS-CoV-2 target nucleic acids are NOT DETECTED. The SARS-CoV-2 RNA is generally detectable in upper and lower respiratory specimens during the acute phase of infection. Negative results do not preclude SARS-CoV-2 infection, do not rule out co-infections with other pathogens, and should not be used as the sole basis for treatment or other patient management decisions. Negative results must be combined with clinical observations, patient history, and epidemiological information. The expected result is Negative. Fact Sheet for Patients: SugarRoll.be Fact Sheet for Healthcare Providers: https://www.woods-mathews.com/ This test is not yet approved or cleared by the [place] FDA and  has been authorized for detection and/or diagnosis of SARS-CoV-2 by FDA under an Emergency Use Authorization (EUA). This EUA will remain  in effect (meaning this test can be used) for the duration of the COVID-19 declaration under Section 56 4(b)(1) of the Act, 21  U.S.C. section 360bbb-3(b)(1), unless the authorization is terminated or revoked sooner. Performed at [facility], [place] [address], [place], [place] [address]   MRSA PCR Screening     Status: None   Collection Time: 05/24/19  6:32 PM   Specimen: Nasal Mucosa; Nasopharyngeal  Result Value Ref Range Status   MRSA by PCR NEGATIVE NEGATIVE Final    Comment:        The GeneXpert MRSA Assay (FDA approved for NASAL specimens only), is one component of a comprehensive MRSA colonization surveillance program. It is not intended to diagnose MRSA infection nor to guide or monitor treatment for MRSA infections. Performed at [facility], [address] [place], [place] [address]     Studies: Dg Abd 2 Views  Result Date: 05/29/2019 CLINICAL DATA:  Abdominal pain.  Capsule study last week. EXAM: ABDOMEN - 2 VIEW COMPARISON:  Abdominal x-ray dated February 03, 2019. FINDINGS: The bowel gas pattern is normal. There is no evidence of free air. Moderate amount of stool throughout the colon. Capsule in the central abdomen. Unchanged moderate hiatal hernia. No radio-opaque calculi or other significant radiographic abnormality is seen. No acute osseous abnormality. IMPRESSION: 1. Capsule in the central abdomen. Consider lateral view or CT to assess whether  it is in the small bowel or transverse colon. 2. No acute findings.  Moderate colonic stool burden. 3. Unchanged moderate hiatal hernia. Electronically Signed   By: Titus Dubin M.D.   On: 05/29/2019 12:43   Dg Abd Portable 2v  Result Date: 05/30/2019 CLINICAL DATA:  Abdominal pain.  Capsule study. EXAM: PORTABLE ABDOMEN - 2 VIEW COMPARISON:  05/29/2019. FINDINGS: Capsule noted over the left mid abdomen. Surgical clips right upper quadrant scratched it surgical clips right abdomen. Very large amount of stool noted throughout the colon suggesting constipation. Fecal impaction cannot be excluded. No bowel distention or free air. Pelvic calcifications  consistent with phleboliths. Bibasilar subsegmental atelectasis. No acute bony abnormality. IMPRESSION: 1.  Capsule noted over the left mid abdomen. 2. Large amount of stool noted throughout the colon suggesting constipation. Fecal impaction cannot be excluded. 3.  Bibasilar atelectasis. Electronically Signed   By: Marcello Moores  Register   On: 05/30/2019 08:35   Scheduled Meds: . atorvastatin  20 mg Oral Daily  . cyanocobalamin  1,000 mcg Intramuscular Weekly  . doxycycline  100 mg Oral Q12H  . feeding supplement  1 Container Oral TID BM  . fluticasone furoate-vilanterol  1 puff Inhalation Daily  . gabapentin  300 mg Oral TID  . insulin aspart  0-15 Units Subcutaneous TID WC  . insulin aspart  0-5 Units Subcutaneous QHS  . levothyroxine  25 mcg Oral QAC breakfast  . linaclotide  290 mcg Oral QAC breakfast  . lipase/protease/amylase  24,000 Units Oral With snacks  . lipase/protease/amylase  36,000 Units Oral TID WC  . lisinopril  2.5 mg Oral Daily  . multivitamin with minerals  1 tablet Oral Daily  . pantoprazole (PROTONIX) IV  40 mg Intravenous BID AC  . umeclidinium bromide  1 puff Inhalation Daily  . valACYclovir  500 mg Oral Daily   Continuous Infusions:  Active Problems:   ANEMIA, IRON DEFICIENCY   Essential hypertension   GERD   Asthma exacerbation   CAP (community acquired pneumonia)   COPD exacerbation ([place])   DM type 2 (diabetes mellitus, type 2) ([place])   Diabetic neuropathy ([place])   Vitamin B12 deficiency   Symptomatic anemia   Arteriovenous malformation (AVM)  Time spent:   Irwin Brakeman, MD [facility] 05/30/2019, 12:41 PM    LOS: 6 days  How to contact the [facility] Attending or Consulting provider [place] or covering provider during after hours [place], for this patient?  1. Check the care team in [facility] and look for a) attending/consulting TRH provider listed and b) the [facility] team listed 2. Log into [email or web address] and use [place]'s universal password to access. If  you do not have the password, please contact the hospital operator. 3. Locate the [facility] provider you are looking for under [facility] and page to a number that you can be directly reached. 4. If you still have difficulty reaching the provider, please page the [facility] (Director on Call) for the Hospitalists listed on amion for assistance.

## 2019-05-30 NOTE — Progress Notes (Signed)
Subjective: Had RUQ pain last night. This has improved. Minimal intermittent soreness now. States it is time for her celiac block again. She normally get pain in her chest and RUQ prior to celiac block. Feels lower abdominal pain is worse today. Pain comes and goes. About 6/10. Feels like a menstrual cramp. No significant improvement after BM last night. BM was several small balls but felt like it was a good amount. Had bright red blood mixed in the stool. Reports she has blood in the stool fairly frequently. For the last 3 months, has been with every BM. Only having a BM once a week. Mild straining last night. No BM today. No black stool. States she has failed Linzess in the past. Not sure what dosing. This was several years ago. Stool softeners have worked well for her in the past. Intermittent nausea and reflux symptoms. No vomiting.   Objective: Vital signs in last 24 hours: Temp:  [98.4 F (36.9 C)] 98.4 F (36.9 C) (11/03 0536) Pulse Rate:  [82-102] 82 (11/03 0536) Resp:  [18-20] 18 (11/03 0536) BP: (123-128)/(77-83) 128/83 (11/03 0536) SpO2:  [95 %-98 %] 98 % (11/03 0536) Last BM Date: 05/29/19 General:   Alert and oriented, pleasant Head:  Normocephalic and atraumatic. Eyes:  No icterus, sclera clear. Conjuctiva pink.  Abdomen:  Bowel sounds present, non-distended, soft, mild tenderness to palpation in the lower abdomen. Minimal tenderness to palpation in the upper abdomen. No HSM or hernias noted. No rebound or guarding. No masses appreciated  Msk:  Symmetrical without gross deformities. Normal posture. Extremities:  Without edema. Neurologic:  Alert and  oriented x4;  grossly normal neurologically. Skin:  Warm and dry, intact without significant lesions.  Psych:  Normal mood and affect.  Lab Results: Recent Labs    05/28/19 0750  WBC 10.8*  HGB 10.3*  HCT 33.8*  PLT 291    Studies/Results: Dg Abd 2 Views  Result Date: 05/29/2019 CLINICAL DATA:  Abdominal pain.   Capsule study last week. EXAM: ABDOMEN - 2 VIEW COMPARISON:  Abdominal x-ray dated February 03, 2019. FINDINGS: The bowel gas pattern is normal. There is no evidence of free air. Moderate amount of stool throughout the colon. Capsule in the central abdomen. Unchanged moderate hiatal hernia. No radio-opaque calculi or other significant radiographic abnormality is seen. No acute osseous abnormality. IMPRESSION: 1. Capsule in the central abdomen. Consider lateral view or CT to assess whether it is in the small bowel or transverse colon. 2. No acute findings.  Moderate colonic stool burden. 3. Unchanged moderate hiatal hernia. Electronically Signed   By: Titus Dubin M.D.   On: 05/29/2019 12:43   Dg Abd Portable 2v  Result Date: 05/30/2019 CLINICAL DATA:  Abdominal pain.  Capsule study. EXAM: PORTABLE ABDOMEN - 2 VIEW COMPARISON:  05/29/2019. FINDINGS: Capsule noted over the left mid abdomen. Surgical clips right upper quadrant scratched it surgical clips right abdomen. Very large amount of stool noted throughout the colon suggesting constipation. Fecal impaction cannot be excluded. No bowel distention or free air. Pelvic calcifications consistent with phleboliths. Bibasilar subsegmental atelectasis. No acute bony abnormality. IMPRESSION: 1.  Capsule noted over the left mid abdomen. 2. Large amount of stool noted throughout the colon suggesting constipation. Fecal impaction cannot be excluded. 3.  Bibasilar atelectasis. Electronically Signed   By: Marcello Moores  Register   On: 05/30/2019 08:35    Assessment: 48 year old female with history of transfusion dependent anemia, IDA, without overt GI bleeding, s/p EGD with erosive gastritis,  large hiatal hernia, mild pyloric stenosis s/p dilation, mild duodenal web s/p dilation, with capsule placed, noting bright red blood/AVMs in duodenum. Capsule did not reach cecum. Abdominal x-ray today with capsule in left mid abdomen. Large stool burden throughout the colon. Plans are to  transfer patient to [facility] for enteroscopy; however, no bed is available as of this morning. Last hemoglobin on 11/1 stable at 10.3. She did have a BM last night with several small balls of stool with bright red blood mixed in. BRBPR has been present for several months occurring with each BM. Mild straining. She continues with intermittent nausea and reflux symptoms. No vomiting. Suspect brbpr may be related to hemorrhoids in the setting of chronic constipation. Doubt significant lower GI bleed but will go ahead and recheck hemoglobin today to ensure stability. If stable, she will need to have TCS outpatient with Dr. Newman Pies (primary GI).   Constipation: Chronic. In the setting of chronic opioid therapy. Reports failing Linzess several years ago. Not sure what dosing she tried. Reports stool softeners have worked well for her historically although outpatient, she reports only having one BM/week. She had a small BM last night with several small balls of stool on colace and MiraLAX.  Abdominal x-ray today with large stool burden throughout the colon. Also with some increase in lower abdominal cramping today. I feel she needs a prescriptive agent to get her bowels moving with the amount of stool in the colon. Will try Linzess 290 mcg to see if we can get her bowels moving. Suspect abdominal pain will improve as her bowels start moving. Movantik would likely be a good choice outpatient.   Chronic pancreatitis: Reports it is time for her celiac block. Starting to have some RUQ pain as she usually does prior to her block. Pain is minimal today. Continue to monitor. Continue pancreatic enzymes.    Plan: Awaiting bed at [facility].  CBC today to ensure stability of hemoglobin in the setting of hematochezia last night.  Stop stool softeners and start Linzess 290 mcg Repeat abdominal x-ray tomorrow to assess capsule location Continue Protonix BID Continue Zofran as needed for nausea.    LOS: 6 days     05/30/2019, 8:52 AM   Chelsea Altes, PA-C [facility]

## 2019-05-30 NOTE — Progress Notes (Signed)
2liters golytely ordered to assist with bowel purge. Patient with large amount of stool in colon. Reassess tomorrow. May need additional 2 liters.

## 2019-05-31 ENCOUNTER — Inpatient Hospital Stay (HOSPITAL_COMMUNITY): Payer: Medicare Other

## 2019-05-31 DIAGNOSIS — E039 Hypothyroidism, unspecified: Secondary | ICD-10-CM | POA: Diagnosis not present

## 2019-05-31 DIAGNOSIS — Z87891 Personal history of nicotine dependence: Secondary | ICD-10-CM | POA: Diagnosis not present

## 2019-05-31 DIAGNOSIS — E119 Type 2 diabetes mellitus without complications: Secondary | ICD-10-CM | POA: Diagnosis not present

## 2019-05-31 DIAGNOSIS — K449 Diaphragmatic hernia without obstruction or gangrene: Secondary | ICD-10-CM | POA: Diagnosis not present

## 2019-05-31 DIAGNOSIS — Z79899 Other long term (current) drug therapy: Secondary | ICD-10-CM | POA: Diagnosis not present

## 2019-05-31 DIAGNOSIS — Z9889 Other specified postprocedural states: Secondary | ICD-10-CM | POA: Diagnosis not present

## 2019-05-31 DIAGNOSIS — Z7984 Long term (current) use of oral hypoglycemic drugs: Secondary | ICD-10-CM | POA: Diagnosis not present

## 2019-05-31 DIAGNOSIS — K861 Other chronic pancreatitis: Secondary | ICD-10-CM | POA: Diagnosis not present

## 2019-05-31 DIAGNOSIS — E785 Hyperlipidemia, unspecified: Secondary | ICD-10-CM | POA: Diagnosis not present

## 2019-05-31 DIAGNOSIS — D509 Iron deficiency anemia, unspecified: Secondary | ICD-10-CM | POA: Diagnosis not present

## 2019-05-31 DIAGNOSIS — J449 Chronic obstructive pulmonary disease, unspecified: Secondary | ICD-10-CM | POA: Diagnosis not present

## 2019-05-31 DIAGNOSIS — K5520 Angiodysplasia of colon without hemorrhage: Secondary | ICD-10-CM | POA: Diagnosis not present

## 2019-05-31 DIAGNOSIS — I10 Essential (primary) hypertension: Secondary | ICD-10-CM | POA: Diagnosis not present

## 2019-05-31 DIAGNOSIS — Z7951 Long term (current) use of inhaled steroids: Secondary | ICD-10-CM | POA: Diagnosis not present

## 2019-05-31 LAB — CBC
HCT: 37.4 % (ref 36.0–46.0)
Hemoglobin: 11.6 g/dL — ABNORMAL LOW (ref 12.0–15.0)
MCH: 28.1 pg (ref 26.0–34.0)
MCHC: 31 g/dL (ref 30.0–36.0)
MCV: 90.6 fL (ref 80.0–100.0)
Platelets: 418 10*3/uL — ABNORMAL HIGH (ref 150–400)
RBC: 4.13 MIL/uL (ref 3.87–5.11)
RDW: 20.9 % — ABNORMAL HIGH (ref 11.5–15.5)
WBC: 9.4 10*3/uL (ref 4.0–10.5)
nRBC: 0 % (ref 0.0–0.2)

## 2019-05-31 LAB — GLUCOSE, CAPILLARY: Glucose-Capillary: 116 mg/dL — ABNORMAL HIGH (ref 70–99)

## 2019-05-31 MED ORDER — PANCRELIPASE (LIP-PROT-AMYL) 24000-76000 UNITS PO CPEP: 24000.0000 [IU] | ORAL_CAPSULE | ORAL | 0 refills | Status: AC

## 2019-05-31 MED ORDER — PANCRELIPASE (LIP-PROT-AMYL) 36000-114000 UNITS PO CPEP: 36000.0000 [IU] | ORAL_CAPSULE | Freq: Three times a day (TID) | ORAL | 0 refills | Status: AC

## 2019-05-31 MED ORDER — DOXYCYCLINE HYCLATE 100 MG PO TABS: 100.0000 mg | ORAL_TABLET | Freq: Two times a day (BID) | ORAL | 0 refills | Status: AC

## 2019-05-31 MED ORDER — LINACLOTIDE 290 MCG PO CAPS: 290.0000 ug | ORAL_CAPSULE | Freq: Every day | ORAL | 0 refills | Status: DC

## 2019-05-31 NOTE — Discharge Summary (Signed)
Physician Discharge Summary  [name] [id number] DOB: [birth year] DOA: 05/24/2019  PCP: Redmond School, MD  Admit date: 05/24/2019  Discharge date: 05/31/2019  Admitted From:Home  Disposition:  Transfer to [facility] for enteroscopy, did not tolerate GoLYTELY prep and will require MiraLAX prep prior to procedure  -Continue Linzess as prescribed for chronic constipation -Continue PPI twice daily -Continue Creon as prescribed for pancreatic insufficiency/chronic pancreatitis -Attempted GoLYTELY prep on evening of 11/3, but patient cannot tolerate this and ended up vomiting up the prep with no bowel movement noted today.  Consider MiraLAX prep after transfer. -Continue doxycycline for 2 more days to complete course of treatment for COPD exacerbation -Patient has capsule endoscopy that is ongoing and will require abdominal KUB for further evaluation of location   Discharge Condition:Stable  CODE STATUS: Full  Diet recommendation: Heart Healthy  Brief/Interim Summary: 48 y.o.female,w Hypertension, Hyperlipidemia, Dm2, w neuropathy, Gerd, Asthma/ Copd, on oxygen at home, 3 L nasal cannula intermittently, patient presents to ED secondary to complaints of shortness of breath, patient with recent hospitalization secondary to asthma exacerbation, and anemia, status post endoscopy and PRBC transfusion,patient presents to ED secondary to complaints of dyspnea, 1 week, worsening over the last 2 days, as well she reports her phlegm, becoming productive, green in color, she denies any hemoptysis, chest pain, fever or chills, he denies any melena, coffee-ground emesis, nausea or vomiting. - in EDpatient was noted to have anemia with hemoglobin of 6.9, as well her chest x-ray significant opacities questionable for pneumonia,she had significant wheezing on presentation, which she did require IV steroids.  Patient was admitted for acute on chronic respiratory failure secondary to  acute COPD exacerbation and has completed course of IV steroids.  She requires 2 more days of doxycycline to complete course of treatment.   She has a history of transfusion dependent anemia, IDA, erosive gastritis, large hiatal hernia, mild pyloric stenosis status post dilation, and mild duodenal web status post dilation.  She was noted to have chronic blood loss anemia status post 2 units of PRBCs and gastroenterology has been following patient with capsule endoscopy with findings of AVMs.  At the moment, plan is to continue PPI twice daily as well as Linzess as prescribed for chronic constipation.  She is also noted to have pancreatic insufficiency with chronic pancreatitis and has been placed on higher doses of Creon.  She is doing well today without any significant symptomatic complaints or concerns.  She denies any shortness of breath, abdominal pain, nausea, vomiting, or diarrhea.  She has not had any bowel movements noted overnight.  She is stable for transfer for further evaluation via enteroscopy to [place].  Discharge Diagnoses:  Active Problems:   ANEMIA, IRON DEFICIENCY   Essential hypertension   GERD   Asthma exacerbation   CAP (community acquired pneumonia)   COPD exacerbation ([place])   DM type 2 (diabetes mellitus, type 2) ([place])   Diabetic neuropathy ([place])   Vitamin B12 deficiency   Symptomatic anemia   Arteriovenous malformation (AVM)  Principal discharge diagnosis: Chronic blood loss anemia with noted AVMs in further need of evaluation via enteroscopy.  Discharge Instructions  Discharge Instructions    Diet - low sodium heart healthy   Complete by: As directed    Increase activity slowly   Complete by: As directed      Allergies as of 05/31/2019      Reactions   Penicillins Shortness Of Breath    Has patient had  a PCN reaction causing immediate rash, facial/tongue/throat swelling, SOB or lightheadedness with hypotension: Yes Has patient had a PCN  reaction causing severe rash involving mucus membranes or skin necrosis: Yes Has patient had a PCN reaction that required hospitalization No Has patient had a PCN reaction occurring within the last 10 years: No  If all of the above answers are "NO", then may proceed with Cephalosporin use. Tolerated ceftriaxone   Sulfa Antibiotics Shortness Of Breath   Aspirin Nausea And Vomiting   Lovenox  [enoxaparin Sodium] Other (See Comments)   Has been known to cause her blood clots in her legs   Prednisone Other (See Comments)   Pancreatitis, but has to take for asthma sometimes      Medication List    STOP taking these medications   furosemide 20 MG tablet Commonly known as: LASIX   levofloxacin 500 MG tablet Commonly known as: LEVAQUIN   metFORMIN 500 MG tablet Commonly known as: GLUCOPHAGE   Zenpep 20000-63000 units Cpep Generic drug: Pancrelipase (Lip-Prot-Amyl) Replaced by: Pancrelipase (Lip-Prot-Amyl) 24000-76000 units Cpep You also have another medication with the same name that you need to continue taking as instructed.     TAKE these medications   albuterol 108 (90 Base) MCG/ACT inhaler Commonly known as: VENTOLIN HFA Inhale 2 puffs into the lungs daily as needed for wheezing. For shortness of breath   albuterol (2.5 MG/3ML) 0.083% nebulizer solution Commonly known as: PROVENTIL Take 2.5 mg by nebulization every 6 (six) hours as needed for wheezing or shortness of breath.   amitriptyline 100 MG tablet Commonly known as: ELAVIL Take 100 mg by mouth at bedtime.   atorvastatin 20 MG tablet Commonly known as: LIPITOR Take 20 mg by mouth daily.   budesonide-formoterol 80-4.5 MCG/ACT inhaler Commonly known as: Symbicort Inhale 2 puffs into the lungs 2 (two) times daily.   busPIRone 10 MG tablet Commonly known as: BUSPAR Take 10 mg by mouth 3 (three) times daily.   citalopram 40 MG tablet Commonly known as: CELEXA Take 40 mg by mouth daily.   docusate sodium 100 MG  capsule Commonly known as: COLACE Take 100 mg by mouth at bedtime.   doxycycline 100 MG tablet Commonly known as: VIBRA-TABS Take 1 tablet (100 mg total) by mouth every 12 (twelve) hours for 2 days.   ferrous sulfate 325 (65 FE) MG tablet Take 325 mg by mouth daily.   gabapentin 300 MG capsule Commonly known as: NEURONTIN Take 300 mg by mouth 3 (three) times daily.   levothyroxine 25 MCG tablet Commonly known as: SYNTHROID Take 25 mcg by mouth daily before breakfast.   linaclotide 290 MCG Caps capsule Commonly known as: LINZESS Take 1 capsule (290 mcg total) by mouth daily before breakfast.   lipase/protease/amylase 36000 UNITS Cpep capsule Commonly known as: CREON Take 1 capsule (36,000 Units total) by mouth 3 (three) times daily with meals. What changed:   medication strength  how much to take  when to take this  Another medication with the same name was removed. Continue taking this medication, and follow the directions you see here.   Pancrelipase (Lip-Prot-Amyl) 24000-76000 units Cpep Take 1 capsule (24,000 Units total) by mouth with snacks. What changed: You were already taking a medication with the same name, and this prescription was added. Make sure you understand how and when to take each. Replaces: Zenpep 20000-63000 units Cpep   lisinopril 2.5 MG tablet Commonly known as: ZESTRIL Take 2.5 mg by mouth daily.   methocarbamol 500  MG tablet Commonly known as: ROBAXIN Take 500 mg by mouth 3 (three) times daily.   oxyCODONE 15 MG immediate release tablet Commonly known as: ROXICODONE Take 15 mg by mouth 4 (four) times daily.   OXYGEN Inhale 3 L into the lungs daily as needed (for shortness of breath).   pantoprazole 40 MG tablet Commonly known as: PROTONIX Take 40 mg by mouth 2 (two) times daily.   polyethylene glycol powder 17 GM/SCOOP powder Commonly known as: GLYCOLAX/MIRALAX Take 17 g by mouth 2 (two) times daily. Until daily soft  stools  OTC   promethazine 25 MG tablet Commonly known as: PHENERGAN Take 25 mg by mouth every 6 (six) hours as needed for nausea or vomiting.   Spiriva HandiHaler 18 MCG inhalation capsule Generic drug: tiotropium Place 18 mcg into inhaler and inhale daily.   valACYclovir 500 MG tablet Commonly known as: VALTREX Take 500 mg by mouth daily.      Follow-up Information    Redmond School, MD Follow up in 2 week(s).   Specialty: Internal Medicine Contact information: [address] [place] [id number] [phone]          Allergies  Allergen Reactions  . Penicillins Shortness Of Breath     Has patient had a PCN reaction causing immediate rash, facial/tongue/throat swelling, SOB or lightheadedness with hypotension: Yes Has patient had a PCN reaction causing severe rash involving mucus membranes or skin necrosis: Yes Has patient had a PCN reaction that required hospitalization No Has patient had a PCN reaction occurring within the last 10 years: No  If all of the above answers are "NO", then may proceed with Cephalosporin use. Tolerated ceftriaxone  . Sulfa Antibiotics Shortness Of Breath  . Aspirin Nausea And Vomiting  . Lovenox  [Enoxaparin Sodium] Other (See Comments)    Has been known to cause her blood clots in her legs  . Prednisone Other (See Comments)    Pancreatitis, but has to take for asthma sometimes    Consultations:  GI   Procedures/Studies: Dg Chest Portable 1 View  Result Date: 05/24/2019 CLINICAL DATA:  Shortness of breath, pneumonia since summer, recent hospitalization EXAM: PORTABLE CHEST 1 VIEW COMPARISON:  Radiograph 05/06/2019 FINDINGS: Bandlike areas of opacities in the mid lungs may reflect subsegmental atelectasis. There are chronic bronchitic changes throughout the lungs with diffuse airways thickening. Some patchy suprahilar and apical opacities are present. No pneumothorax or effusion. Cardiomediastinal contours are stable from  prior including a calcified aorta. No acute osseous or soft tissue abnormality. IMPRESSION: 1. Bandlike areas of opacities in the mid lungs may reflect subsegmental atelectasis. 2. Some patchy suprahilar and apical opacities are present with airways thickening and bronchitic features could reflect ongoing infection or inflammation. Electronically Signed   By: Lovena Le M.D.   On: 05/24/2019 16:58   Dg Chest Port 1 View  Result Date: 05/06/2019 CLINICAL DATA:  Pt states she has asthma and has been battling pneumonia since a hospital admission in July. Pt seen PCP at [facility] Thursday has been on multiple rounds of antibiotics. Follow-up with PCP scheduled next week. EXAM: PORTABLE CHEST 1 VIEW COMPARISON:  Chest radiograph 02/03/2019, 04/28/2018 FINDINGS: The heart size and mediastinal contours are within normal limits. There are diffuse bilateral interstitial opacities. There are small focal opacities in the right base and left mid lung. No pneumothorax or large pleural effusion. No acute finding in the visualized skeleton. IMPRESSION: 1. Diffuse bilateral interstitial opacities could represent reactive airways disease, atypical infection, or edema.  2. Small focal opacities in the right base and left mid lung may reflect superimposed atelectasis versus infection. Electronically Signed   By: Audie Pinto M.D.   On: 05/06/2019 18:26   Dg Abd 2 Views  Result Date: 05/29/2019 CLINICAL DATA:  Abdominal pain.  Capsule study last week. EXAM: ABDOMEN - 2 VIEW COMPARISON:  Abdominal x-ray dated February 03, 2019. FINDINGS: The bowel gas pattern is normal. There is no evidence of free air. Moderate amount of stool throughout the colon. Capsule in the central abdomen. Unchanged moderate hiatal hernia. No radio-opaque calculi or other significant radiographic abnormality is seen. No acute osseous abnormality. IMPRESSION: 1. Capsule in the central abdomen. Consider lateral view or CT to assess whether it is in the  small bowel or transverse colon. 2. No acute findings.  Moderate colonic stool burden. 3. Unchanged moderate hiatal hernia. Electronically Signed   By: Titus Dubin M.D.   On: 05/29/2019 12:43   Dg Abd Portable 2v  Result Date: 05/30/2019 CLINICAL DATA:  Abdominal pain.  Capsule study. EXAM: PORTABLE ABDOMEN - 2 VIEW COMPARISON:  05/29/2019. FINDINGS: Capsule noted over the left mid abdomen. Surgical clips right upper quadrant scratched it surgical clips right abdomen. Very large amount of stool noted throughout the colon suggesting constipation. Fecal impaction cannot be excluded. No bowel distention or free air. Pelvic calcifications consistent with phleboliths. Bibasilar subsegmental atelectasis. No acute bony abnormality. IMPRESSION: 1.  Capsule noted over the left mid abdomen. 2. Large amount of stool noted throughout the colon suggesting constipation. Fecal impaction cannot be excluded. 3.  Bibasilar atelectasis. Electronically Signed   By: Marcello Moores  Register   On: 05/30/2019 08:35     Discharge Exam: Vitals:   05/31/19 0745 05/31/19 0746  BP:  112/78  Pulse:  92  Resp:  16  Temp:  98.2 F (36.8 C)  SpO2: 99% 98%   Vitals:   05/31/19 0454 05/31/19 0744 05/31/19 0745 05/31/19 0746  BP: 114/81   112/78  Pulse: 97   92  Resp: 16   16  Temp: 98.1 F (36.7 C)   98.2 F (36.8 C)  TempSrc: Oral   Oral  SpO2: 93% 99% 99% 98%  Weight:      Height:        General: Pt is alert, awake, not in acute distress Cardiovascular: RRR, S1/S2 +, no rubs, no gallops Respiratory: CTA bilaterally, no wheezing, no rhonchi Abdominal: Soft, NT, ND, bowel sounds + Extremities: no edema, no cyanosis    The results of significant diagnostics from this hospitalization (including imaging, microbiology, ancillary and laboratory) are listed below for reference.     Microbiology: Recent Results (from the past 240 hour(s))  SARS CORONAVIRUS 2 (TAT 6-24 HRS) Nasopharyngeal Nasopharyngeal Swab      Status: None   Collection Time: 05/24/19  6:00 PM   Specimen: Nasopharyngeal Swab  Result Value Ref Range Status   SARS Coronavirus 2 NEGATIVE NEGATIVE Final    Comment: (NOTE) SARS-CoV-2 target nucleic acids are NOT DETECTED. The SARS-CoV-2 RNA is generally detectable in upper and lower respiratory specimens during the acute phase of infection. Negative results do not preclude SARS-CoV-2 infection, do not rule out co-infections with other pathogens, and should not be used as the sole basis for treatment or other patient management decisions. Negative results must be combined with clinical observations, patient history, and epidemiological information. The expected result is Negative. Fact Sheet for Patients: SugarRoll.be Fact Sheet for Healthcare Providers: https://www.woods-mathews.com/ This test is not yet  approved or cleared by the [place] and  has been authorized for detection and/or diagnosis of SARS-CoV-2 by FDA under an Emergency Use Authorization (EUA). This EUA will remain  in effect (meaning this test can be used) for the duration of the COVID-19 declaration under Section 56 4(b)(1) of the Act, 21 U.S.C. section 360bbb-3(b)(1), unless the authorization is terminated or revoked sooner. Performed at [facility], [place] [address] [place], [place] [address]   MRSA PCR Screening     Status: None   Collection Time: 05/24/19  6:32 PM   Specimen: Nasal Mucosa; Nasopharyngeal  Result Value Ref Range Status   MRSA by PCR NEGATIVE NEGATIVE Final    Comment:        The GeneXpert MRSA Assay (FDA approved for NASAL specimens only), is one component of a comprehensive MRSA colonization surveillance program. It is not intended to diagnose MRSA infection nor to guide or monitor treatment for MRSA infections. Performed at [facility], [address], [place],  [address]      Labs: BNP (last 3 results) Recent  Labs    05/06/19 1746  BNP 123456*   Basic Metabolic Panel: Recent Labs  Lab 05/24/19 1618 05/25/19 0549 05/26/19 0633  NA 136 137 137  K 3.7 3.9 4.5  CL 105 104 104  CO2 23 22 26   GLUCOSE 205* 223* 185*  BUN 23* 13 12  CREATININE 0.91 0.58 0.61  CALCIUM 8.4* 8.5* 8.9   Liver Function Tests: Recent Labs  Lab 05/24/19 1618  AST 19  ALT 15  ALKPHOS 54  BILITOT 0.3  PROT 6.2*  ALBUMIN 3.1*   No results for input(s): LIPASE, AMYLASE in the last 168 hours. No results for input(s): AMMONIA in the last 168 hours. CBC: Recent Labs  Lab 05/24/19 1618 05/25/19 0549 05/26/19 0633 05/28/19 0750 05/30/19 1001 05/31/19 0449  WBC 12.1* 8.8 9.3 10.8* 10.2 9.4  NEUTROABS 9.0*  --  8.2*  --  6.8  --   HGB 6.9* 10.1* 10.3* 10.3* 10.7* 11.6*  HCT 23.5* 32.3* 33.0* 33.8* 34.1* 37.4  MCV 88.0 89.2 90.4 91.4 89.5 90.6  PLT 242 194 224 291 347 418*   Cardiac Enzymes: No results for input(s): CKTOTAL, CKMB, CKMBINDEX, TROPONINI in the last 168 hours. BNP: Invalid input(s): POCBNP CBG: Recent Labs  Lab 05/30/19 0718 05/30/19 1142 05/30/19 1641 05/30/19 2134 05/31/19 0737  GLUCAP 121* 177* 147* 95 116*   D-Dimer No results for input(s): DDIMER in the last 72 hours. Hgb A1c No results for input(s): HGBA1C in the last 72 hours. Lipid Profile No results for input(s): CHOL, HDL, LDLCALC, TRIG, CHOLHDL, LDLDIRECT in the last 72 hours. Thyroid function studies No results for input(s): TSH, T4TOTAL, T3FREE, THYROIDAB in the last 72 hours.  Invalid input(s): FREET3 Anemia work up No results for input(s): VITAMINB12, FOLATE, FERRITIN, TIBC, IRON, RETICCTPCT in the last 72 hours. Urinalysis    Component Value Date/Time   COLORURINE COLORLESS (A) 05/06/2019 1746   APPEARANCEUR CLEAR 05/06/2019 1746   LABSPEC 1.004 (L) 05/06/2019 1746   PHURINE 5.0 05/06/2019 1746   GLUCOSEU NEGATIVE 05/06/2019 1746   HGBUR NEGATIVE 05/06/2019 1746   BILIRUBINUR NEGATIVE 05/06/2019 1746    KETONESUR NEGATIVE 05/06/2019 1746   PROTEINUR NEGATIVE 05/06/2019 1746   UROBILINOGEN 0.2 12/06/2014 1819   NITRITE NEGATIVE 05/06/2019 1746   LEUKOCYTESUR NEGATIVE 05/06/2019 1746   Sepsis Labs Invalid input(s): PROCALCITONIN,  WBC,  LACTICIDVEN Microbiology Recent Results (from the past 240 hour(s))  SARS CORONAVIRUS 2 (TAT 6-24 HRS) Nasopharyngeal Nasopharyngeal Swab     Status: None   Collection Time: 05/24/19  6:00 PM   Specimen: Nasopharyngeal Swab  Result Value Ref Range Status   SARS Coronavirus 2 NEGATIVE NEGATIVE Final    Comment: (NOTE) SARS-CoV-2 target nucleic acids are NOT DETECTED. The SARS-CoV-2 RNA is generally detectable in upper and lower respiratory specimens during the acute phase of infection. Negative results do not preclude SARS-CoV-2 infection, do not rule out co-infections with other pathogens, and should not be used as the sole basis for treatment or other patient management decisions. Negative results must be combined with clinical observations, patient history, and epidemiological information. The expected result is Negative. Fact Sheet for Patients: SugarRoll.be Fact Sheet for Healthcare Providers: https://www.woods-mathews.com/ This test is not yet approved or cleared by the [place] FDA and  has been authorized for detection and/or diagnosis of SARS-CoV-2 by FDA under an Emergency Use Authorization (EUA). This EUA will remain  in effect (meaning this test can be used) for the duration of the COVID-19 declaration under Section 56 4(b)(1) of the Act, 21 U.S.C. section 360bbb-3(b)(1), unless the authorization is terminated or revoked sooner. Performed at [facility], [place] [address] [place], [place] [address]   MRSA PCR Screening     Status: None   Collection Time: 05/24/19  6:32 PM   Specimen: Nasal Mucosa; Nasopharyngeal  Result Value Ref Range Status   MRSA by PCR NEGATIVE NEGATIVE Final     Comment:        The GeneXpert MRSA Assay (FDA approved for NASAL specimens only), is one component of a comprehensive MRSA colonization surveillance program. It is not intended to diagnose MRSA infection nor to guide or monitor treatment for MRSA infections. Performed at [facility], [address], [place], [place] [address]      Time coordinating discharge: 35 minutes  SIGNED:   Rodena Goldmann, DO [facility] 05/31/2019, 8:52 AM  If 7PM-7AM, please contact night-coverage [email or web address]

## 2019-05-31 NOTE — Progress Notes (Signed)
Report called to Marlane Hatcher at [place] by [facility] transport

## 2019-06-01 ENCOUNTER — Other Ambulatory Visit: Payer: Self-pay | Admitting: *Deleted

## 2019-06-01 DIAGNOSIS — K449 Diaphragmatic hernia without obstruction or gangrene: Secondary | ICD-10-CM | POA: Diagnosis not present

## 2019-06-01 DIAGNOSIS — E785 Hyperlipidemia, unspecified: Secondary | ICD-10-CM | POA: Diagnosis not present

## 2019-06-01 DIAGNOSIS — Z79899 Other long term (current) drug therapy: Secondary | ICD-10-CM | POA: Diagnosis not present

## 2019-06-01 DIAGNOSIS — I10 Essential (primary) hypertension: Secondary | ICD-10-CM | POA: Diagnosis not present

## 2019-06-01 DIAGNOSIS — D509 Iron deficiency anemia, unspecified: Secondary | ICD-10-CM | POA: Diagnosis not present

## 2019-06-01 DIAGNOSIS — E119 Type 2 diabetes mellitus without complications: Secondary | ICD-10-CM | POA: Diagnosis not present

## 2019-06-01 DIAGNOSIS — E039 Hypothyroidism, unspecified: Secondary | ICD-10-CM | POA: Diagnosis not present

## 2019-06-01 DIAGNOSIS — Z87891 Personal history of nicotine dependence: Secondary | ICD-10-CM | POA: Diagnosis not present

## 2019-06-01 DIAGNOSIS — J449 Chronic obstructive pulmonary disease, unspecified: Secondary | ICD-10-CM | POA: Diagnosis not present

## 2019-06-01 DIAGNOSIS — Z7984 Long term (current) use of oral hypoglycemic drugs: Secondary | ICD-10-CM | POA: Diagnosis not present

## 2019-06-01 DIAGNOSIS — Z9889 Other specified postprocedural states: Secondary | ICD-10-CM | POA: Diagnosis not present

## 2019-06-01 DIAGNOSIS — K861 Other chronic pancreatitis: Secondary | ICD-10-CM | POA: Diagnosis not present

## 2019-06-01 DIAGNOSIS — K5520 Angiodysplasia of colon without hemorrhage: Secondary | ICD-10-CM | POA: Diagnosis not present

## 2019-06-01 DIAGNOSIS — Z7951 Long term (current) use of inhaled steroids: Secondary | ICD-10-CM | POA: Diagnosis not present

## 2019-06-01 MED ORDER — LISINOPRIL 2.5 MG PO TABS
2.50 | ORAL_TABLET | ORAL | Status: DC
Start: 2019-06-02 — End: 2019-06-01

## 2019-06-01 MED ORDER — AMLODIPINE BESYLATE 5 MG PO TABS
10.00 | ORAL_TABLET | ORAL | Status: DC
Start: 2019-06-02 — End: 2019-06-01

## 2019-06-01 MED ORDER — CITALOPRAM HYDROBROMIDE 20 MG PO TABS
40.00 | ORAL_TABLET | ORAL | Status: DC
Start: 2019-06-02 — End: 2019-06-01

## 2019-06-01 MED ORDER — SORBITOL 70 % PO SOLN
30.00 | ORAL | Status: DC
Start: ? — End: 2019-06-01

## 2019-06-01 MED ORDER — ONDANSETRON 4 MG PO TBDP
4.00 | ORAL_TABLET | ORAL | Status: DC
Start: ? — End: 2019-06-01

## 2019-06-01 MED ORDER — GABAPENTIN 300 MG PO CAPS
300.00 | ORAL_CAPSULE | ORAL | Status: DC
Start: 2019-06-01 — End: 2019-06-01

## 2019-06-01 MED ORDER — PANTOPRAZOLE SODIUM 40 MG PO TBEC
40.00 | DELAYED_RELEASE_TABLET | ORAL | Status: DC
Start: 2019-06-01 — End: 2019-06-01

## 2019-06-01 MED ORDER — FUROSEMIDE 20 MG PO TABS
20.00 | ORAL_TABLET | ORAL | Status: DC
Start: 2019-06-02 — End: 2019-06-01

## 2019-06-01 MED ORDER — TIOTROPIUM BROMIDE MONOHYDRATE 18 MCG IN CAPS
1.00 | ORAL_CAPSULE | RESPIRATORY_TRACT | Status: DC
Start: 2019-06-02 — End: 2019-06-01

## 2019-06-01 MED ORDER — LEVOTHYROXINE SODIUM 25 MCG PO TABS
25.00 | ORAL_TABLET | ORAL | Status: DC
Start: 2019-06-02 — End: 2019-06-01

## 2019-06-01 MED ORDER — AMITRIPTYLINE HCL 25 MG PO TABS
100.00 | ORAL_TABLET | ORAL | Status: DC
Start: 2019-06-01 — End: 2019-06-01

## 2019-06-01 MED ORDER — OXYCODONE HCL 5 MG/5ML PO SOLN
15.00 | ORAL | Status: DC
Start: ? — End: 2019-06-01

## 2019-06-01 MED ORDER — GLUCOSE 40 % PO GEL
15.00 | ORAL | Status: DC
Start: ? — End: 2019-06-01

## 2019-06-01 MED ORDER — ACETAMINOPHEN 500 MG PO TABS
500.00 | ORAL_TABLET | ORAL | Status: DC
Start: ? — End: 2019-06-01

## 2019-06-01 MED ORDER — BUSPIRONE HCL 5 MG PO TABS
10.00 | ORAL_TABLET | ORAL | Status: DC
Start: 2019-06-01 — End: 2019-06-01

## 2019-06-01 MED ORDER — ATORVASTATIN CALCIUM 10 MG PO TABS
20.00 | ORAL_TABLET | ORAL | Status: DC
Start: 2019-06-01 — End: 2019-06-01

## 2019-06-01 MED ORDER — INSULIN LISPRO 100 UNIT/ML ~~LOC~~ SOLN
2.00 | SUBCUTANEOUS | Status: DC
Start: 2019-06-01 — End: 2019-06-01

## 2019-06-01 MED ORDER — ALBUTEROL SULFATE (2.5 MG/3ML) 0.083% IN NEBU
2.50 | INHALATION_SOLUTION | RESPIRATORY_TRACT | Status: DC
Start: ? — End: 2019-06-01

## 2019-06-01 MED ORDER — VALACYCLOVIR HCL 500 MG PO TABS
500.00 | ORAL_TABLET | ORAL | Status: DC
Start: 2019-06-02 — End: 2019-06-01

## 2019-06-01 MED ORDER — PANCRELIPASE (LIP-PROT-AMYL) 20000-63000 UNITS PO CPEP
20000.00 | ORAL_CAPSULE | ORAL | Status: DC
Start: 2019-06-01 — End: 2019-06-01

## 2019-06-01 MED ORDER — DEXTROSE 10 % IV SOLN
125.00 | INTRAVENOUS | Status: DC
Start: ? — End: 2019-06-01

## 2019-06-01 MED ORDER — BISACODYL 10 MG RE SUPP
10.00 | RECTAL | Status: DC
Start: ? — End: 2019-06-01

## 2019-06-01 MED ORDER — BUDESONIDE-FORMOTEROL FUMARATE 160-4.5 MCG/ACT IN AERO
2.00 | INHALATION_SPRAY | RESPIRATORY_TRACT | Status: DC
Start: 2019-06-02 — End: 2019-06-01

## 2019-06-01 NOTE — Patient Outreach (Signed)
Pt hospitalized 10/28-11/4/20 [facility] for GI bleed, anemia, pt transferred to [facility] on 05/31/19 for enteroscopy, outreach call to pt cell phone, spoke with pt who reports she is at [facility] and will be having tests done, states " I'm bleeding internally and they don't know where"  Pt unsure how long she will be at [place] will be hospitalized 10 days tomorrow 06/02/19.  PLAN Will close case at tenth day of hospitalization  Jacqlyn Larsen [facility], [place] Coordinator [phone]

## 2019-06-02 ENCOUNTER — Other Ambulatory Visit: Payer: Self-pay | Admitting: *Deleted

## 2019-06-02 ENCOUNTER — Ambulatory Visit: Payer: Self-pay | Admitting: *Deleted

## 2019-06-02 DIAGNOSIS — K921 Melena: Secondary | ICD-10-CM | POA: Diagnosis not present

## 2019-06-02 DIAGNOSIS — Z7984 Long term (current) use of oral hypoglycemic drugs: Secondary | ICD-10-CM | POA: Diagnosis not present

## 2019-06-02 DIAGNOSIS — Z8719 Personal history of other diseases of the digestive system: Secondary | ICD-10-CM | POA: Diagnosis not present

## 2019-06-02 DIAGNOSIS — Z1211 Encounter for screening for malignant neoplasm of colon: Secondary | ICD-10-CM | POA: Diagnosis not present

## 2019-06-02 DIAGNOSIS — D509 Iron deficiency anemia, unspecified: Secondary | ICD-10-CM | POA: Diagnosis not present

## 2019-06-02 DIAGNOSIS — E785 Hyperlipidemia, unspecified: Secondary | ICD-10-CM | POA: Diagnosis not present

## 2019-06-02 DIAGNOSIS — E039 Hypothyroidism, unspecified: Secondary | ICD-10-CM | POA: Diagnosis not present

## 2019-06-02 DIAGNOSIS — E119 Type 2 diabetes mellitus without complications: Secondary | ICD-10-CM | POA: Diagnosis not present

## 2019-06-02 DIAGNOSIS — Z9889 Other specified postprocedural states: Secondary | ICD-10-CM | POA: Diagnosis not present

## 2019-06-02 DIAGNOSIS — I10 Essential (primary) hypertension: Secondary | ICD-10-CM | POA: Diagnosis not present

## 2019-06-02 DIAGNOSIS — Z7951 Long term (current) use of inhaled steroids: Secondary | ICD-10-CM | POA: Diagnosis not present

## 2019-06-02 DIAGNOSIS — Z87891 Personal history of nicotine dependence: Secondary | ICD-10-CM | POA: Diagnosis not present

## 2019-06-02 DIAGNOSIS — K861 Other chronic pancreatitis: Secondary | ICD-10-CM | POA: Diagnosis not present

## 2019-06-02 DIAGNOSIS — J449 Chronic obstructive pulmonary disease, unspecified: Secondary | ICD-10-CM | POA: Diagnosis not present

## 2019-06-02 DIAGNOSIS — K3189 Other diseases of stomach and duodenum: Secondary | ICD-10-CM | POA: Diagnosis not present

## 2019-06-02 DIAGNOSIS — K5520 Angiodysplasia of colon without hemorrhage: Secondary | ICD-10-CM | POA: Diagnosis not present

## 2019-06-02 DIAGNOSIS — K449 Diaphragmatic hernia without obstruction or gangrene: Secondary | ICD-10-CM | POA: Diagnosis not present

## 2019-06-02 DIAGNOSIS — Z79899 Other long term (current) drug therapy: Secondary | ICD-10-CM | POA: Diagnosis not present

## 2019-06-02 NOTE — Patient Outreach (Signed)
[facility]) Care Management  06/02/2019  [name] December 30, 1970 [id number]   CSW closing case/referral due to pt remaining in hospital for >10 days per protocol. Please re-consult CSW if needs arise.   Eduard Clos, MSW, [place] Worker  [place] [phone]

## 2019-06-02 NOTE — Patient Outreach (Signed)
Per conversation with pt on 06/01/19, she will be at [facility] at least several more days, case will be closed today due to tenth day of hospitalization.  RN CM sent in basket to [place] her of case closure,  RN CM faxed case closure letter to primary MD and mailed case closure letter to pt home.  Case closed  Jacqlyn Larsen [facility], [place] Coordinator [phone]

## 2019-06-06 DIAGNOSIS — K922 Gastrointestinal hemorrhage, unspecified: Secondary | ICD-10-CM | POA: Diagnosis not present

## 2019-06-06 DIAGNOSIS — Z6821 Body mass index (BMI) 21.0-21.9, adult: Secondary | ICD-10-CM | POA: Diagnosis not present

## 2019-06-06 DIAGNOSIS — Q2730 Arteriovenous malformation, site unspecified: Secondary | ICD-10-CM | POA: Diagnosis not present

## 2019-06-06 DIAGNOSIS — D649 Anemia, unspecified: Secondary | ICD-10-CM | POA: Diagnosis not present

## 2019-06-07 ENCOUNTER — Telehealth: Payer: Self-pay | Admitting: Gastroenterology

## 2019-06-07 NOTE — Telephone Encounter (Signed)
PT said she called Dr. Newman Pies and has appointment tomorrow.

## 2019-06-07 NOTE — Telephone Encounter (Signed)
PATIENT CALLED STATING THAT [facility] HAS DONE A PROCEDURE ON HER IN THE PAST AND SHE HAD AN ISSUE, STATED SHE CAN NOT GO TO THE BATHROOM   THE PATIENT IS ESTABLISHED WITH ANOTHER GASTRO DOCTOR AND I TOLD HER SHE SHOULD CONTACT THAT OFFICE

## 2019-06-08 NOTE — Telephone Encounter (Signed)
PT IS A RMR PT. 

## 2019-06-09 ENCOUNTER — Other Ambulatory Visit: Payer: Self-pay | Admitting: *Deleted

## 2019-06-09 ENCOUNTER — Encounter: Payer: Self-pay | Admitting: *Deleted

## 2019-06-09 NOTE — Patient Outreach (Signed)
Per difficult case discussion 06/09/19, RN CM is to continue following pt, (case had been closed) MD completes transition of care, outreach call to pt for follow up, pt states she discharged from [facility] on 06/07/19 after enteroscopy and states " they still couldn't find out where my bleeding is coming from"  Pt states Hgb 11 upon discharge and she is to have checked q 2 weeks at primary MD.  Pt states she saw primary care MD this week and no changes made.  Pt to follow up again with primary MD on 06/19/19.  Pt states she has had some frequent stools due to [place], also has not checked CBG since coming home but plans to start back today.  Pt states breathing is " good and I'm off the antibiotic"  Pt report her feet do have some swelling but this is not an unusual occurrence.  RN CM reviewed medications with pt. RN CM reiterated action plan.    THN CM Care Plan Problem One     Most Recent Value  Care Plan Problem One  Knowledge deficit related to COPD  Role Documenting the Problem One  Care Management Coordinator  Care Plan for Problem One  Active  THN Long Term Goal   Pt will verbalize improvement in self care related to COPD/ pneumonia within 60 days  THN Long Term Goal Start Date  06/09/19  Interventions for Problem One Long Term Goal  RN CM reviewed plan of care, recent hospitalization, upcoming appointments  THN CM Short Term Goal #1   Pt will verbalize COPD action plan within 30 days  THN CM Short Term Goal #1 Start Date  06/09/19  Interventions for Short Term Goal #1  RN CM reviewed COPD action plan, importance of symptom management    [facility] CM Care Plan Problem Two     Most Recent Value  Care Plan Problem Two  recent hospitalization due to GI bleeding  Role Documenting the Problem Two  Care Management Coordinator  Care Plan for Problem Two  Active  THN CM Short Term Goal #1   Pt will verbalize signs/ symptoms GI bleeding and actions to take within 30 days  THN CM Short Term Goal #1 Start  Date  06/09/19  Interventions for Short Term Goal #2   RN CM reviewed signs/ symptoms GI bleeding (bright red vs. dark blood), importance of contacting MD early for any changes, importance of having Hgb checked q 2 weeks as ordered and follow up with MD      PLAN Outreach pt next week for follow up and reinforcement  Jacqlyn Larsen [facility], [place] Coordinator [phone]

## 2019-06-09 NOTE — Patient Outreach (Signed)
Per difficult case discussion 06/09/19, RN CM to continue following pt (was hospitalized greater than 10 days / [facility])  RN CM made case active again, sent in basket to [place] with update.  Jacqlyn Larsen [facility], [place] Coordinator [phone]

## 2019-06-15 ENCOUNTER — Other Ambulatory Visit: Payer: Self-pay | Admitting: *Deleted

## 2019-06-15 NOTE — Patient Outreach (Signed)
Outreach call to pt for follow up, reinforcement, no answer to telephone and no option to leave voicemail,  RN CM mailed unsuccessful outreach letter to pt home.  PLAN Outreach pt in 3-4 business days  Jacqlyn Larsen [facility], [place] [phone]

## 2019-06-17 DIAGNOSIS — J449 Chronic obstructive pulmonary disease, unspecified: Secondary | ICD-10-CM | POA: Diagnosis not present

## 2019-06-17 DIAGNOSIS — J9610 Chronic respiratory failure, unspecified whether with hypoxia or hypercapnia: Secondary | ICD-10-CM | POA: Diagnosis not present

## 2019-06-19 DIAGNOSIS — K861 Other chronic pancreatitis: Secondary | ICD-10-CM | POA: Diagnosis not present

## 2019-06-19 DIAGNOSIS — G894 Chronic pain syndrome: Secondary | ICD-10-CM | POA: Diagnosis not present

## 2019-06-20 ENCOUNTER — Other Ambulatory Visit: Payer: Self-pay | Admitting: *Deleted

## 2019-06-20 NOTE — Patient Outreach (Signed)
Outreach call to pt for follow up/ assessment/ 2nd attempt, no answer to telephone and no option to leave voicemail.  PLAN Outreach pt in 3-4 business days  Jacqlyn Larsen [facility], [place] [phone]

## 2019-06-21 DIAGNOSIS — J449 Chronic obstructive pulmonary disease, unspecified: Secondary | ICD-10-CM | POA: Diagnosis not present

## 2019-06-25 DIAGNOSIS — Z880 Allergy status to penicillin: Secondary | ICD-10-CM | POA: Diagnosis not present

## 2019-06-25 DIAGNOSIS — Z7984 Long term (current) use of oral hypoglycemic drugs: Secondary | ICD-10-CM | POA: Diagnosis not present

## 2019-06-25 DIAGNOSIS — W1839XA Other fall on same level, initial encounter: Secondary | ICD-10-CM | POA: Diagnosis not present

## 2019-06-25 DIAGNOSIS — S52592A Other fractures of lower end of left radius, initial encounter for closed fracture: Secondary | ICD-10-CM | POA: Diagnosis not present

## 2019-06-25 DIAGNOSIS — K219 Gastro-esophageal reflux disease without esophagitis: Secondary | ICD-10-CM | POA: Diagnosis not present

## 2019-06-25 DIAGNOSIS — Z87891 Personal history of nicotine dependence: Secondary | ICD-10-CM | POA: Diagnosis not present

## 2019-06-25 DIAGNOSIS — S62102A Fracture of unspecified carpal bone, left wrist, initial encounter for closed fracture: Secondary | ICD-10-CM | POA: Diagnosis not present

## 2019-06-25 DIAGNOSIS — S52325A Nondisplaced transverse fracture of shaft of left radius, initial encounter for closed fracture: Secondary | ICD-10-CM | POA: Diagnosis not present

## 2019-06-25 DIAGNOSIS — E039 Hypothyroidism, unspecified: Secondary | ICD-10-CM | POA: Diagnosis not present

## 2019-06-25 DIAGNOSIS — Z882 Allergy status to sulfonamides status: Secondary | ICD-10-CM | POA: Diagnosis not present

## 2019-06-25 DIAGNOSIS — E1140 Type 2 diabetes mellitus with diabetic neuropathy, unspecified: Secondary | ICD-10-CM | POA: Diagnosis not present

## 2019-06-25 DIAGNOSIS — J449 Chronic obstructive pulmonary disease, unspecified: Secondary | ICD-10-CM | POA: Diagnosis not present

## 2019-06-25 DIAGNOSIS — I10 Essential (primary) hypertension: Secondary | ICD-10-CM | POA: Diagnosis not present

## 2019-06-25 DIAGNOSIS — Z79899 Other long term (current) drug therapy: Secondary | ICD-10-CM | POA: Diagnosis not present

## 2019-06-26 DIAGNOSIS — E7849 Other hyperlipidemia: Secondary | ICD-10-CM | POA: Diagnosis not present

## 2019-06-26 DIAGNOSIS — I10 Essential (primary) hypertension: Secondary | ICD-10-CM | POA: Diagnosis not present

## 2019-06-26 DIAGNOSIS — J441 Chronic obstructive pulmonary disease with (acute) exacerbation: Secondary | ICD-10-CM | POA: Diagnosis not present

## 2019-06-26 DIAGNOSIS — E1140 Type 2 diabetes mellitus with diabetic neuropathy, unspecified: Secondary | ICD-10-CM | POA: Diagnosis not present

## 2019-06-28 ENCOUNTER — Encounter: Payer: Self-pay | Admitting: *Deleted

## 2019-06-28 ENCOUNTER — Other Ambulatory Visit: Payer: Self-pay | Admitting: *Deleted

## 2019-06-28 DIAGNOSIS — S52502A Unspecified fracture of the lower end of left radius, initial encounter for closed fracture: Secondary | ICD-10-CM | POA: Diagnosis not present

## 2019-06-28 NOTE — Patient Outreach (Signed)
Outreach call to pt for telephone assessment, spoke with pt, HIPAA verified, pt reports "breathing is good"  No longer on antibiotics,  Has all medications and taking as prescribed.  CBG "has been good with the highest reading 150".  Saw primary care on 06/19/19 with no changes made.  Pt fell and fractured her left arm on 06/25/19 stating she may have stood up too fast, pt is seeing orthopedic doctor today.  Pt is to reschedule getting Hgb checked.  RN CM reviewed safety precautions with pt and importance of rising slowly, not getting in a hurry, do not ambulate if dizzy and ask for assistance as needed.  THN CM Care Plan Problem One     Most Recent Value  Care Plan Problem One  Knowledge deficit related to COPD  Role Documenting the Problem One  Care Management Coordinator  Care Plan for Problem One  Active  THN Long Term Goal   Pt will verbalize improvement in self care related to COPD/ pneumonia within 60 days  THN Long Term Goal Start Date  06/09/19  Interventions for Problem One Long Term Goal  RN CM reinforced plan of care with pt, pt saw primary care provider 06/19/19 and no changes made, pt reports she has all medications and taking as prescribed  THN CM Short Term Goal #1   Pt will verbalize COPD action plan within 30 days  THN CM Short Term Goal #1 Start Date  06/09/19  [facility] CM Short Term Goal #1 Met Date  06/28/19  Interventions for Short Term Goal #1  RN CM reinforced COPD action plan, emphasis placed on yellow zone    [facility] CM Care Plan Problem Two     Most Recent Value  Care Plan Problem Two  recent hospitalization due to GI bleeding  Role Documenting the Problem Two  Care Management Coordinator  Care Plan for Problem Two  Active  THN CM Short Term Goal #1   Pt will verbalize signs/ symptoms GI bleeding and actions to take within 30 days  THN CM Short Term Goal #1 Start Date  06/28/19 [goal re-established]  Interventions for Short Term Goal #2   Pt missed getting Hgb checked at lab  due to fracturing her arm,  RN CM ask pt to schedule labwork and get completed as soon as possible, reviewed signs / symptoms GI bleeding      PLAN Outreach pt next month for telephone assessment  Jacqlyn Larsen [facility], [place] Coordinator [phone]

## 2019-07-04 DIAGNOSIS — Q2730 Arteriovenous malformation, site unspecified: Secondary | ICD-10-CM | POA: Diagnosis not present

## 2019-07-04 DIAGNOSIS — K922 Gastrointestinal hemorrhage, unspecified: Secondary | ICD-10-CM | POA: Diagnosis not present

## 2019-07-04 DIAGNOSIS — D649 Anemia, unspecified: Secondary | ICD-10-CM | POA: Diagnosis not present

## 2019-07-04 DIAGNOSIS — Z6821 Body mass index (BMI) 21.0-21.9, adult: Secondary | ICD-10-CM | POA: Diagnosis not present

## 2019-07-06 ENCOUNTER — Other Ambulatory Visit: Payer: Self-pay | Admitting: Internal Medicine

## 2019-07-06 ENCOUNTER — Other Ambulatory Visit (HOSPITAL_COMMUNITY): Payer: Self-pay | Admitting: Internal Medicine

## 2019-07-06 DIAGNOSIS — G894 Chronic pain syndrome: Secondary | ICD-10-CM | POA: Diagnosis not present

## 2019-07-06 DIAGNOSIS — L98499 Non-pressure chronic ulcer of skin of other sites with unspecified severity: Secondary | ICD-10-CM | POA: Diagnosis not present

## 2019-07-06 DIAGNOSIS — Z6821 Body mass index (BMI) 21.0-21.9, adult: Secondary | ICD-10-CM | POA: Diagnosis not present

## 2019-07-06 DIAGNOSIS — E119 Type 2 diabetes mellitus without complications: Secondary | ICD-10-CM | POA: Diagnosis not present

## 2019-07-06 DIAGNOSIS — I10 Essential (primary) hypertension: Secondary | ICD-10-CM | POA: Diagnosis not present

## 2019-07-12 ENCOUNTER — Ambulatory Visit (HOSPITAL_COMMUNITY): Admission: RE | Admit: 2019-07-12 | Payer: Medicare Other | Source: Ambulatory Visit

## 2019-07-12 ENCOUNTER — Encounter (HOSPITAL_COMMUNITY): Payer: Self-pay

## 2019-07-17 DIAGNOSIS — J449 Chronic obstructive pulmonary disease, unspecified: Secondary | ICD-10-CM | POA: Diagnosis not present

## 2019-07-17 DIAGNOSIS — J9610 Chronic respiratory failure, unspecified whether with hypoxia or hypercapnia: Secondary | ICD-10-CM | POA: Diagnosis not present

## 2019-07-19 DIAGNOSIS — S52502D Unspecified fracture of the lower end of left radius, subsequent encounter for closed fracture with routine healing: Secondary | ICD-10-CM | POA: Diagnosis not present

## 2019-07-21 DIAGNOSIS — J449 Chronic obstructive pulmonary disease, unspecified: Secondary | ICD-10-CM | POA: Diagnosis not present

## 2019-07-25 ENCOUNTER — Ambulatory Visit
Admission: EM | Admit: 2019-07-25 | Discharge: 2019-07-25 | Disposition: A | Discharge status: Home / Self Care | Payer: Medicare Other | Attending: Emergency Medicine | Admitting: Emergency Medicine

## 2019-07-25 ENCOUNTER — Other Ambulatory Visit: Payer: Self-pay

## 2019-07-25 DIAGNOSIS — S61412A Laceration without foreign body of left hand, initial encounter: Secondary | ICD-10-CM | POA: Diagnosis not present

## 2019-07-25 DIAGNOSIS — Z20828 Contact with and (suspected) exposure to other viral communicable diseases: Secondary | ICD-10-CM | POA: Diagnosis not present

## 2019-07-25 DIAGNOSIS — X58XXXA Exposure to other specified factors, initial encounter: Secondary | ICD-10-CM | POA: Diagnosis not present

## 2019-07-25 DIAGNOSIS — Z20822 Contact with and (suspected) exposure to covid-19: Secondary | ICD-10-CM

## 2019-07-25 LAB — POC SARS CORONAVIRUS 2 AG -  ED: SARS Coronavirus 2 Ag: NEGATIVE

## 2019-07-25 MED ORDER — MUPIROCIN CALCIUM 2 % EX CREA: 1.0000 "application " | TOPICAL_CREAM | Freq: Two times a day (BID) | CUTANEOUS | 0 refills | Status: DC

## 2019-07-25 MED ORDER — AZITHROMYCIN 250 MG PO TABS: 250.0000 mg | ORAL_TABLET | Freq: Every day | ORAL | 0 refills | Status: DC

## 2019-07-25 NOTE — Discharge Instructions (Addendum)
Clean skin laceration daily with water and soap Apply Bactroban ointment daily  Point-of-care COVID-19 test was negative  COVID testing ordered.  It may take between 2 - 7 days for test results  In the meantime: You should remain isolated in your home for 10 days from symptom onset  Encourage fluid intake.   Use tylenol/ motrin as needed for pain and fever Follow up with PCP  Call or go to the ED if child has any new or worsening symptoms like fever, decreased appetite, decreased activity, turning blue, nasal flaring, rib retractions, wheezing, rash, changes in bowel or bladder habits, etc..Marland Kitchen

## 2019-07-25 NOTE — ED Provider Notes (Signed)
[facility]    CSN: [id number] Arrival date & time: 07/25/19  1737      History   Chief Complaint Chief Complaint  Patient presents with  . Laceration    HPI [name] is a 48 y.o. female.   [name] 48 years old female presented to the urgent care with a  complaint of shortness of breath for the past week and left hand laceration for the past 1 day.  Report a broken Casserol cut her left dorsal part of her  hand.  Reported using albuterol often for shortness of breath with mild relief.  Denies sick exposure to COVID, flu or strep.  Denies recent travel.  Denies aggravating or alleviating symptoms.  Denies previous COVID infection.   Denies fever, chills, fatigue, nasal congestion, rhinorrhea, sore throat, cough,  wheezing, chest pain, nausea, vomiting, changes in bowel or bladder habits.    The history is provided by the patient. No language interpreter was used.  Laceration   Past Medical History:  Diagnosis Date  . Anemia   . Anxiety   . Asthma   . Bipolar 1 disorder ([place])   . Chronic abdominal pain   . COPD (chronic obstructive pulmonary disease) ([place])   . Depression   . Diabetes mellitus   . Diabetic neuropathy ([place]) 10/29/2017  . Esophagitis   . Hiatal hernia   . Hyperlipidemia 02/03/2012  . Migraine   . Neck pain 06/08/2012  . Pancreatitis chronic Idiopathic   Treated by Dr. Newman Pies at [facility] in the past with celiac blocks.    Patient Active Problem List   Diagnosis Date Noted  . Arteriovenous malformation (AVM)   . Symptomatic anemia   . Vitamin B12 deficiency 05/25/2019  . Melena   . History of pancreatitis 05/06/2019  . Sepsis ([place]) 05/06/2019  . Diabetic neuropathy ([place]) 10/29/2017  . DM type 2 (diabetes mellitus, type 2) ([place]) 03/28/2014  . COPD exacerbation ([place]) 03/27/2014  . CAP (community acquired pneumonia) 06/29/2013  . Acute bronchitis 06/29/2013  . Anxiety 02/27/2013  . Abdominal cramping 02/27/2013  .  BRBPR (bright red blood per rectum) 12/02/2012  . Constipation/Stool Impaction 12/02/2012  . Acute blood loss anemia 12/02/2012  . Pancreatitis 11/24/2012  . Hyperglycemia, drug-induced 06/08/2012  . Arm paresthesia, left 06/07/2012  . Neck pain on left side 06/07/2012  . Asthma exacerbation 04/05/2012  . Acute otitis media 02/04/2012  . Hyperlipidemia 02/03/2012  . Acute respiratory failure with hypoxia ([place]) 09/23/2011  . Tobacco abuse 09/23/2011  . Constipation 08/29/2011  . Hypokalemia 07/15/2011  . Muscle spasm 04/24/2011  . Otitis externa 04/24/2011  . Idiopathic acute pancreatitis 04/24/2011  . ANEMIA, IRON DEFICIENCY 01/04/2009  . ANXIETY 01/04/2009  . Bipolar disorder ([place]) 01/04/2009  . MIGRAINE HEADACHE 01/04/2009  . Essential hypertension 01/04/2009  . ASTHMA 01/04/2009  . ESOPHAGEAL STRICTURE 01/04/2009  . GERD 01/04/2009  . HIATAL HERNIA 01/04/2009  . HEMATOCHEZIA 01/04/2009  . Diarrhea 01/04/2009  . ABDOMINAL PAIN 01/04/2009    Past Surgical History:  Procedure Laterality Date  . ABDOMINAL HYSTERECTOMY    . attempted colonoscopy  02/2017   Dr. Newman Pies: Poor prep, scope passed to mid transverse colon before colonoscopy aborted.  No significant findings noted to mid transverse colon.  Next colonoscopy in 2 years.  . CELIAC PLEXUS BLOCK    . CHOLECYSTECTOMY    . COLONOSCOPY N/A 12/05/2012   [id number] rectum, colon and terminal ileum  . ESOPHAGOGASTRODUODENOSCOPY  02/20/2008   [id number] distal esophageal  mucosa, suspicious for neoplasm/Hiatal hernia otherwise normal   . ESOPHAGOGASTRODUODENOSCOPY  04/03/2008   [id number] hiatal hernia, otherwise normal/Short, tight, benign-appearing peptic stricture  . ESOPHAGOGASTRODUODENOSCOPY  November 16, 2012   Dr. Britta Mccreedy: hiatal hernia, esophagitis,   . ESOPHAGOGASTRODUODENOSCOPY (EGD) WITH PROPOFOL N/A 05/09/2019   Dr. Oneida Alar: Large hiatal hernia with erythema and edema in the pouch, mild gastritis.  No  biopsies taken.  . ESOPHAGOGASTRODUODENOSCOPY (EGD) WITH PROPOFOL N/A 05/26/2019   Procedure: ESOPHAGOGASTRODUODENOSCOPY (EGD) WITH PROPOFOL;  Surgeon: Danie Binder, MD;  [facility];  Service: Endoscopy;  Laterality: N/A;  . EUS  12/2018   Dr. Newman Pies: LA grade B esophagitis, antritis but negative H. pylori, 1 cm ulcer at the duodenal sweep  . GIVENS CAPSULE STUDY N/A 12/05/2012   Few superficial erosions but nothing found to explain iron deficiency anemia.   Marland Kitchen GIVENS CAPSULE STUDY N/A 05/26/2019   Procedure: GIVENS CAPSULE STUDY;  Surgeon: Danie Binder, MD;  Location: [facility];  Service: Endoscopy;  Laterality: N/A;  . Ileocolonoscopy  06/05/2008     [id number] anal canal, otherwise normal rectum, colon  . TONSILLECTOMY      OB History    Gravida  2   Para  2   Term  2   Preterm      AB      Living        SAB      TAB      Ectopic      Multiple      Live Births               Home Medications    Prior to Admission medications   Medication Sig Start Date End Date Taking? Authorizing Provider  albuterol (PROVENTIL HFA;VENTOLIN HFA) 108 (90 BASE) MCG/ACT inhaler Inhale 2 puffs into the lungs daily as needed for wheezing. For shortness of breath    [provider]  albuterol (PROVENTIL) (2.5 MG/3ML) 0.083% nebulizer solution Take 2.5 mg by nebulization every 6 (six) hours as needed for wheezing or shortness of breath.  05/24/19   [provider]  amitriptyline (ELAVIL) 100 MG tablet Take 100 mg by mouth at bedtime.    [provider]  atorvastatin (LIPITOR) 20 MG tablet Take 20 mg by mouth daily.    [provider]  azithromycin (ZITHROMAX) 250 MG tablet Take 1 tablet (250 mg total) by mouth daily. Take first 2 tablets together, then 1 every day until finished. 07/25/19   Marvyn Torrez, Darrelyn Hillock, FNP  budesonide-formoterol (SYMBICORT) 80-4.5 MCG/ACT inhaler Inhale 2 puffs into the lungs 2 (two) times daily. 03/30/14    Samuella Cota, MD  busPIRone (BUSPAR) 10 MG tablet Take 10 mg by mouth 3 (three) times daily.    [provider]  citalopram (CELEXA) 40 MG tablet Take 40 mg by mouth daily. 05/17/19   [provider]  docusate sodium (COLACE) 100 MG capsule Take 100 mg by mouth at bedtime.     [provider]  ferrous sulfate 325 (65 FE) MG tablet Take 325 mg by mouth daily.    [provider]  gabapentin (NEURONTIN) 300 MG capsule Take 300 mg by mouth 3 (three) times daily.  04/11/19   [provider]  levothyroxine (SYNTHROID, LEVOTHROID) 25 MCG tablet Take 25 mcg by mouth daily before breakfast.    [provider]  linaclotide (LINZESS) 290 MCG CAPS capsule Take 1 capsule (290 mcg total) by mouth daily before breakfast. 05/31/19 06/30/19  Manuella Ghazi, Pratik D, DO  lisinopril (PRINIVIL,ZESTRIL) 2.5 MG tablet Take 2.5 mg by mouth daily.  01/18/18   [provider]  methocarbamol (ROBAXIN) 500 MG tablet Take 500 mg by mouth 3 (three) times daily.    [provider]  mupirocin cream (BACTROBAN) 2 % Apply 1 application topically 2 (two) times daily. 07/25/19   Willadene Mounsey, Darrelyn Hillock, FNP  oxyCODONE (ROXICODONE) 15 MG immediate release tablet Take 15 mg by mouth 4 (four) times daily.  05/22/19   [provider]  OXYGEN Inhale 3 L into the lungs daily as needed (for shortness of breath).    [provider]  pantoprazole (PROTONIX) 40 MG tablet Take 40 mg by mouth 2 (two) times daily.    [provider]  polyethylene glycol powder (GLYCOLAX/MIRALAX) powder Take 17 g by mouth 2 (two) times daily. Until daily soft stools  OTC Patient not taking: Reported on 06/09/2019 03/05/15   Nona Dell, PA-C  promethazine (PHENERGAN) 25 MG tablet Take 25 mg by mouth every 6 (six) hours as needed for nausea or vomiting.    [provider]  SPIRIVA HANDIHALER 18 MCG inhalation capsule Place 18 mcg into inhaler and inhale  daily.  04/11/19   [provider]  valACYclovir (VALTREX) 500 MG tablet Take 500 mg by mouth daily.    [provider]    Family History Family History  Problem Relation Age of Onset  . Asthma Other   . Asthma Paternal Grandfather   . Heart attack Paternal Grandfather   . Hypertension Mother   . Cancer Maternal Grandmother   . Diabetes Paternal Grandmother   . Coronary artery disease Neg Hx   . Colon cancer Neg Hx     Social History Social History   Tobacco Use  . Smoking status: Former Research scientist (life sciences)  . Smokeless tobacco: Never Used  Substance Use Topics  . Alcohol use: No  . Drug use: No     Allergies   Penicillins, Sulfa antibiotics, Aspirin, Lovenox  [enoxaparin sodium], and Prednisone   Review of Systems Review of Systems  Constitutional: Negative.   Respiratory: Negative.   Cardiovascular: Negative.   Musculoskeletal: Negative.   Skin: Positive for wound.     Physical Exam Triage Vital Signs ED Triage Vitals  Enc Vitals Group     BP 07/25/19 1754 108/72     Pulse Rate 07/25/19 1754 (!) 103     Resp 07/25/19 1754 20     Temp 07/25/19 1754 99.1 F (37.3 C)     Temp src --      SpO2 07/25/19 1754 93 %     Weight --      Height --      Head Circumference --      Peak Flow --      Pain Score 07/25/19 1752 4     Pain Loc --      Pain Edu? --      Excl. in [place]? --    No data found.  Updated Vital Signs BP 108/72   Pulse (!) 103   Temp 99.1 F (37.3 C)   Resp 20   SpO2 93% Comment: pt desats to 89%  Visual Acuity Right Eye Distance:   Left Eye Distance:   Bilateral Distance:    Right Eye Near:   Left Eye Near:    Bilateral Near:     Physical Exam Vitals and nursing note reviewed.  Constitutional:      General: She  is not in acute distress.    Appearance: Normal appearance. She is normal weight. She is not ill-appearing.  Cardiovascular:     Rate and Rhythm: Normal rate and regular rhythm.     Pulses: Normal pulses.      Heart sounds: Normal heart sounds.  Pulmonary:     Effort: Pulmonary effort is normal.     Breath sounds: Normal breath sounds.  Musculoskeletal:        General: Tenderness present. No swelling, deformity or signs of injury.     Right hand: No swelling, deformity, lacerations or tenderness. Normal range of motion. Normal strength. Normal sensation.     Left hand: Laceration and tenderness present. No swelling or deformity. Normal range of motion. Normal strength. Normal sensation.  Skin:    General: Skin is warm and dry.     Coloration: Skin is not jaundiced or pale.     Findings: No bruising or erythema.     Comments: Left hand: Superficial linear cut 5 cm long.  Wound check blood vessel and nerve involvement.    Neurological:     Mental Status: She is alert.      UC Treatments / Results  Labs (all labs ordered are listed, but only abnormal results are displayed) Labs Reviewed  POC SARS CORONAVIRUS 2 AG -  ED    EKG   Radiology No results found.  Procedures Procedures (including critical care time)  Medications Ordered in UC Medications - No data to display  Initial Impression / Assessment and Plan / UC Course  I have reviewed the triage vital signs and the nursing notes.  Pertinent labs & imaging results that were available during my care of the patient were reviewed by me and considered in my medical decision making (see chart for details).   Your point-of-care COVID-19 test was negative.  [facility] Covid test was sent.  Patient stable for discharge.  Advised patient to apply Bactroban to the laceration site.  Azithromycin was prescribed for possible bronchitis.  Return for worsening of symptoms.  Patient verbalized understanding of the plan of care.  Final Clinical Impressions(s) / UC Diagnoses   Final diagnoses:  Suspected COVID-19 virus infection  Laceration of left hand without foreign body, initial encounter     Discharge Instructions     Clean skin  laceration daily with water and soap Apply Bactroban ointment daily  Point-of-care COVID-19 test was negative  COVID testing ordered.  It may take between 2 - 7 days for test results  In the meantime: You should remain isolated in your home for 10 days from symptom onset  Encourage fluid intake.   Use tylenol/ motrin as needed for pain and fever Follow up with PCP  Call or go to the ED if child has any new or worsening symptoms like fever, decreased appetite, decreased activity, turning blue, nasal flaring, rib retractions, wheezing, rash, changes in bowel or bladder habits, etc...      ED Prescriptions    Medication Sig Dispense Auth. Provider   mupirocin cream (BACTROBAN) 2 % Apply 1 application topically 2 (two) times daily. 15 g Kateryn Marasigan, Darrelyn Hillock, FNP   azithromycin (ZITHROMAX) 250 MG tablet Take 1 tablet (250 mg total) by mouth daily. Take first 2 tablets together, then 1 every day until finished. 6 tablet Drey Shaff, Darrelyn Hillock, FNP     PDMP not reviewed this encounter.   Emerson Monte, [place] 07/25/19 [phone]

## 2019-07-25 NOTE — ED Triage Notes (Signed)
Pt cut left hand on broken casserole dish last night

## 2019-07-25 NOTE — ED Triage Notes (Signed)
Pt also having increased sob and using inhaler more often

## 2019-07-26 LAB — NOVEL CORONAVIRUS, NAA: SARS-CoV-2, NAA: NOT DETECTED

## 2019-07-27 DIAGNOSIS — J449 Chronic obstructive pulmonary disease, unspecified: Secondary | ICD-10-CM | POA: Diagnosis not present

## 2019-07-27 DIAGNOSIS — E1140 Type 2 diabetes mellitus with diabetic neuropathy, unspecified: Secondary | ICD-10-CM | POA: Diagnosis not present

## 2019-07-27 DIAGNOSIS — M1991 Primary osteoarthritis, unspecified site: Secondary | ICD-10-CM | POA: Diagnosis not present

## 2019-07-27 DIAGNOSIS — G894 Chronic pain syndrome: Secondary | ICD-10-CM | POA: Diagnosis not present

## 2019-07-31 ENCOUNTER — Telehealth: Payer: Self-pay

## 2019-07-31 NOTE — Telephone Encounter (Signed)
Pt called this UC to receive test result for covid. Pt is informed that covid test is negative. Pt informed that if she develops fevers she needs to bee seen by a provider. Pt agreed and showed understanding.

## 2019-08-01 ENCOUNTER — Other Ambulatory Visit: Payer: Self-pay | Admitting: *Deleted

## 2019-08-01 ENCOUNTER — Encounter: Payer: Self-pay | Admitting: *Deleted

## 2019-08-01 DIAGNOSIS — J441 Chronic obstructive pulmonary disease with (acute) exacerbation: Secondary | ICD-10-CM

## 2019-08-01 NOTE — Patient Outreach (Signed)
Outreach call to [name] for telephone assessment, spoke with [name], HIPAA verified, [name] reports she is feeling better, has all medications and taking as prescribed, left arm healing well, keeping immobile.  [name] reports last time hemoglobin was checked the result "was good, improved"  CBG today 159 with most readings in 100's range.  [name] reports she has finished all antibiotics and breathing is better.  [name] reports she has virtual visit with MD at [facility] (for history of pancreatitis) on 08/02/19. [name] reports she is mindful of things that exacerbate pancreatitis such as stress and taking steroids or "things I eat"  Weight is now 122 pounds and has gained a few pounds intentionally as [name] is eating better.  RN CM transferred [name] to RN health coach for ongoing management of chronic health issues.  RN CM sent letter to primary MD reporting transfer to health coach.  THN CM Care Plan Problem One     Most Recent Value  Care Plan Problem One  Knowledge deficit related to COPD  Role Documenting the Problem One  Care Management Coordinator  Care Plan for Problem One  Active  THN Long Term Goal   [name] will verbalize improvement in self care related to COPD/ pneumonia within 60 days  THN Long Term Goal Start Date  06/09/19  Interventions for Problem One Long Term Goal  RN CM reinforced plan of care with [name] and will transfer to RN health coach for ongoing management of chronic health issues.  THN CM Short Term Goal #1   [name] will verbalize COPD action plan within 30 days  THN CM Short Term Goal #1 Start Date  06/09/19  THN CM Short Term Goal #1 Met Date  06/28/19    THN CM Care Plan Problem Two     Most Recent Value  Care Plan Problem Two  recent hospitalization due to GI bleeding  Role Documenting the Problem Two  Care Management Coordinator  Care Plan for Problem Two  Active  THN CM Short Term Goal #1   [name] will verbalize signs/ symptoms GI bleeding and actions to take within 30 days  THN CM Short Term Goal #1 Start Date   06/28/19 [goal re-established]  Interventions for Short Term Goal #2   RN CM reinforced signs/ symptoms GI bleeding, ([name] denies any further bleeding) and importance of reporting any bleeding to MD     PLAN Transfer to RN health coach    RNC, BSN THN Community Care Coordinator [phone]     

## 2019-08-03 DIAGNOSIS — G8929 Other chronic pain: Secondary | ICD-10-CM | POA: Diagnosis not present

## 2019-08-03 DIAGNOSIS — K922 Gastrointestinal hemorrhage, unspecified: Secondary | ICD-10-CM | POA: Diagnosis not present

## 2019-08-03 DIAGNOSIS — K861 Other chronic pancreatitis: Secondary | ICD-10-CM | POA: Diagnosis not present

## 2019-08-07 DIAGNOSIS — G894 Chronic pain syndrome: Secondary | ICD-10-CM | POA: Diagnosis not present

## 2019-08-14 ENCOUNTER — Other Ambulatory Visit: Payer: Self-pay | Admitting: *Deleted

## 2019-08-17 DIAGNOSIS — J449 Chronic obstructive pulmonary disease, unspecified: Secondary | ICD-10-CM | POA: Diagnosis not present

## 2019-08-17 DIAGNOSIS — J9610 Chronic respiratory failure, unspecified whether with hypoxia or hypercapnia: Secondary | ICD-10-CM | POA: Diagnosis not present

## 2019-08-21 DIAGNOSIS — J449 Chronic obstructive pulmonary disease, unspecified: Secondary | ICD-10-CM | POA: Diagnosis not present

## 2019-08-27 DIAGNOSIS — M1991 Primary osteoarthritis, unspecified site: Secondary | ICD-10-CM | POA: Diagnosis not present

## 2019-08-27 DIAGNOSIS — E1140 Type 2 diabetes mellitus with diabetic neuropathy, unspecified: Secondary | ICD-10-CM | POA: Diagnosis not present

## 2019-08-27 DIAGNOSIS — G894 Chronic pain syndrome: Secondary | ICD-10-CM | POA: Diagnosis not present

## 2019-08-29 ENCOUNTER — Other Ambulatory Visit: Payer: Self-pay

## 2019-08-29 NOTE — Patient Outreach (Signed)
[facility]) Care Management  08/29/2019  Ira P Sethi [birth year] [id number]   Medication Adherence call to Mrs. Dolores Stiehl [employer] spoke with patient she is past due on Atorvastatin Lisinopril 2.5 mg and Metformin 500 mg ,patient explain she is no longer taking Lisinopril doctor took her off and all her medication she receives a pill pack every month from [facility]. Mrs. Boehm is showing past due under [place].   [place] Management Direct Dial [phone]  Fax [phone] [email or web address]

## 2019-09-01 ENCOUNTER — Other Ambulatory Visit: Payer: Self-pay | Admitting: *Deleted

## 2019-09-01 NOTE — Patient Outreach (Signed)
[facility] [facility]) Care Management  09/01/2019  [name] 04-Apr-1971 [id number]  RN Health Coach telephone call to patient.  [employer]ip[employer] compliance verified. Per patient she is feeling very tired feeling she is not sure if she is still having any GI bleeding. RN discussed with patient about talking to the Dr and asking for a hemoccult card to bring in and they can check for hidden blood. Patient has not been monitoring her blood sugars or blood pressure. Patient stated that she also got a call from [facility] and they are sending her a blood pressure monitor and glucometer and would be following her. RN went over the patient medications and patient thought she was off all medications because her blood pressure had been running low. As RN went over her medications RN noted patient is still taking lisinopril. RN made patient aware of this and that she needs to check her blood pressure. RN will follow up next week and patient will discuss the information she receives and let RN Health Coach know if she wants her to follow up also with her.    [place] Management [phone] .

## 2019-09-06 ENCOUNTER — Other Ambulatory Visit: Payer: Self-pay | Admitting: *Deleted

## 2019-09-06 DIAGNOSIS — K861 Other chronic pancreatitis: Secondary | ICD-10-CM | POA: Diagnosis not present

## 2019-09-06 DIAGNOSIS — G894 Chronic pain syndrome: Secondary | ICD-10-CM | POA: Diagnosis not present

## 2019-09-06 DIAGNOSIS — K5732 Diverticulitis of large intestine without perforation or abscess without bleeding: Secondary | ICD-10-CM | POA: Diagnosis not present

## 2019-09-06 NOTE — Patient Outreach (Signed)
[facility]) Care Management  09/06/2019  Tashonda P Gamblin Oct 14, 1970 [id number]   RN Health Coach received  telephone call from patient.  [employer] compliance verified.patient wanted RN Health Coach to know that she did receive the blood pressure monitor and glucometer. She is waiting to see when they call her to put her in their program.  Patient stated that she was having some bright red blood with gas. Patient asking if she should call her Dr. Patient is not having pain or cramping. RN Health Coach explained that she needs to let her Dr know now and if she can't get through then she will need to go to the ED.   [place] [phone]

## 2019-09-08 ENCOUNTER — Ambulatory Visit: Payer: Medicare Other | Admitting: *Deleted

## 2019-09-17 DIAGNOSIS — J9610 Chronic respiratory failure, unspecified whether with hypoxia or hypercapnia: Secondary | ICD-10-CM | POA: Diagnosis not present

## 2019-09-17 DIAGNOSIS — J449 Chronic obstructive pulmonary disease, unspecified: Secondary | ICD-10-CM | POA: Diagnosis not present

## 2019-09-21 DIAGNOSIS — J449 Chronic obstructive pulmonary disease, unspecified: Secondary | ICD-10-CM | POA: Diagnosis not present

## 2019-09-24 DIAGNOSIS — Z87891 Personal history of nicotine dependence: Secondary | ICD-10-CM | POA: Diagnosis not present

## 2019-09-24 DIAGNOSIS — J45909 Unspecified asthma, uncomplicated: Secondary | ICD-10-CM | POA: Diagnosis not present

## 2019-09-24 DIAGNOSIS — M1991 Primary osteoarthritis, unspecified site: Secondary | ICD-10-CM | POA: Diagnosis not present

## 2019-09-24 DIAGNOSIS — E1140 Type 2 diabetes mellitus with diabetic neuropathy, unspecified: Secondary | ICD-10-CM | POA: Diagnosis not present

## 2019-09-28 DIAGNOSIS — J441 Chronic obstructive pulmonary disease with (acute) exacerbation: Secondary | ICD-10-CM | POA: Diagnosis not present

## 2019-10-02 DIAGNOSIS — D649 Anemia, unspecified: Secondary | ICD-10-CM | POA: Diagnosis not present

## 2019-10-02 DIAGNOSIS — K861 Other chronic pancreatitis: Secondary | ICD-10-CM | POA: Diagnosis not present

## 2019-10-02 DIAGNOSIS — G894 Chronic pain syndrome: Secondary | ICD-10-CM | POA: Diagnosis not present

## 2019-10-06 ENCOUNTER — Other Ambulatory Visit: Payer: Self-pay | Admitting: *Deleted

## 2019-10-06 ENCOUNTER — Ambulatory Visit: Payer: Self-pay | Admitting: *Deleted

## 2019-10-06 NOTE — Patient Outreach (Signed)
[facility] [place]) Care Management  10/06/2019  [name] July 03, 1971 [id number]   RN Health Coach Outreach   Outreach Attempt:  Successful telephone outreach to patient to confirm consent for Disease Management program.  HIPAA verified with patient.  RN Health Coach introduced self and Health Coach role.  Discussed [facility] services and Disease Management program.  Patient does acknowledge she is participating with the [place] vital sign monitoring program but would also like to participate with Disease Management program as well with education outreaches.  States she did not take her blood sugar this morning but the other day blood sugar was in 300's fasting.  Did not recheck blood sugar later in the day.  States her blood sugars normally ranges 100-170's.  Patient stating she has been in a lot of pain lately.  Encouraged patient to continue to monitor blood sugars to to contact provider for continued elevations.  Patient agreeable for future outreach to complete initial telephone assessment.  Plan:  RN Health Coach will place patient on primary Health Coach schedule for initial assessment within the month of March.  [place] [phone] [email or web address]

## 2019-10-09 ENCOUNTER — Ambulatory Visit
Admission: EM | Admit: 2019-10-09 | Discharge: 2019-10-09 | Disposition: A | Discharge status: Home / Self Care | Payer: Medicare Other | Attending: Family Medicine | Admitting: Family Medicine

## 2019-10-09 ENCOUNTER — Other Ambulatory Visit: Payer: Self-pay

## 2019-10-09 ENCOUNTER — Encounter: Payer: Self-pay | Admitting: Emergency Medicine

## 2019-10-09 ENCOUNTER — Ambulatory Visit: Payer: Self-pay | Admitting: *Deleted

## 2019-10-09 ENCOUNTER — Ambulatory Visit (INDEPENDENT_AMBULATORY_CARE_PROVIDER_SITE_OTHER): Payer: Medicare Other

## 2019-10-09 DIAGNOSIS — R140 Abdominal distension (gaseous): Secondary | ICD-10-CM | POA: Diagnosis not present

## 2019-10-09 DIAGNOSIS — R350 Frequency of micturition: Secondary | ICD-10-CM | POA: Diagnosis not present

## 2019-10-09 DIAGNOSIS — R1084 Generalized abdominal pain: Secondary | ICD-10-CM | POA: Diagnosis not present

## 2019-10-09 DIAGNOSIS — R05 Cough: Secondary | ICD-10-CM

## 2019-10-09 DIAGNOSIS — R109 Unspecified abdominal pain: Secondary | ICD-10-CM | POA: Diagnosis not present

## 2019-10-09 DIAGNOSIS — J449 Chronic obstructive pulmonary disease, unspecified: Secondary | ICD-10-CM

## 2019-10-09 DIAGNOSIS — K5900 Constipation, unspecified: Secondary | ICD-10-CM | POA: Insufficient documentation

## 2019-10-09 DIAGNOSIS — F17210 Nicotine dependence, cigarettes, uncomplicated: Secondary | ICD-10-CM

## 2019-10-09 DIAGNOSIS — J9809 Other diseases of bronchus, not elsewhere classified: Secondary | ICD-10-CM | POA: Diagnosis not present

## 2019-10-09 LAB — COMPREHENSIVE METABOLIC PANEL
ALT: 18 IU/L (ref 0–32)
AST: 27 IU/L (ref 0–40)
Albumin/Globulin Ratio: 2 (ref 1.2–2.2)
Albumin: 4.7 g/dL (ref 3.8–4.8)
Alkaline Phosphatase: 135 IU/L — ABNORMAL HIGH (ref 39–117)
BUN/Creatinine Ratio: 20 (ref 9–23)
BUN: 29 mg/dL — ABNORMAL HIGH (ref 6–24)
Bilirubin Total: 0.2 mg/dL (ref 0.0–1.2)
CO2: 24 mmol/L (ref 20–29)
Calcium: 9.3 mg/dL (ref 8.7–10.2)
Chloride: 99 mmol/L (ref 96–106)
Creatinine, Ser: 1.42 mg/dL — ABNORMAL HIGH (ref 0.57–1.00)
GFR calc Af Amer: 50 mL/min/{1.73_m2} — ABNORMAL LOW (ref >59)
GFR calc non Af Amer: 43 mL/min/{1.73_m2} — ABNORMAL LOW (ref >59)
Globulin, Total: 2.3 g/dL (ref 1.5–4.5)
Glucose: 98 mg/dL (ref 65–99)
Potassium: 3.9 mmol/L (ref 3.5–5.2)
Sodium: 140 mmol/L (ref 134–144)
Total Protein: 7 g/dL (ref 6.0–8.5)

## 2019-10-09 LAB — CBC
Hematocrit: 34.6 % (ref 34.0–46.6)
Hemoglobin: 11.3 g/dL (ref 11.1–15.9)
MCH: 29.6 pg (ref 26.6–33.0)
MCHC: 32.7 g/dL (ref 31.5–35.7)
MCV: 91 fL (ref 79–97)
Platelets: 307 10*3/uL (ref 150–450)
RBC: 3.82 x10E6/uL (ref 3.77–5.28)
RDW: 13.6 % (ref 11.7–15.4)
WBC: 11.9 10*3/uL — ABNORMAL HIGH (ref 3.4–10.8)

## 2019-10-09 LAB — POCT FASTING CBG KUC MANUAL ENTRY: POCT Glucose (KUC): 109 mg/dL — AB (ref 70–99)

## 2019-10-09 LAB — POCT URINALYSIS DIP (MANUAL ENTRY)
Bilirubin, UA: NEGATIVE
Blood, UA: NEGATIVE
Glucose, UA: NEGATIVE mg/dL
Ketones, POC UA: NEGATIVE mg/dL
Nitrite, UA: NEGATIVE
Protein Ur, POC: NEGATIVE mg/dL
Spec Grav, UA: 1.015 (ref 1.010–1.025)
Urobilinogen, UA: 0.2 E.U./dL
pH, UA: 5 (ref 5.0–8.0)

## 2019-10-09 LAB — LIPASE: Lipase: 11 U/L — ABNORMAL LOW (ref 14–72)

## 2019-10-09 MED ORDER — BISACODYL 5 MG PO TBEC: 5.0000 mg | DELAYED_RELEASE_TABLET | Freq: Two times a day (BID) | ORAL | 0 refills | Status: DC

## 2019-10-09 NOTE — ED Triage Notes (Signed)
Pt sts dysuria and frequency x 2 weeks with some confusion and jitteriness x 2 days; pt A & O x 4 at present

## 2019-10-09 NOTE — ED Provider Notes (Signed)
[facility]    CSN: [id number] Arrival date & time: 10/09/19  1249      History   Chief Complaint Chief Complaint  Patient presents with  . Dysuria    HPI [name] is a 49 y.o. female history of DM type II, COPD, hyperlipidemia presenting today for evaluation of abdominal pain.  Patient notes that for the past 2 weeks she has had abdominal pain especially in her lower abdomen.  She is concerned about possible UTI as she has also had associated back pain.  She denies any dysuria, had brief episode of urinary urgency. Denies frequency or hematuria.  Denies any vaginal discharge, vaginal bleeding itching or irritation.  Has had prior cholecystectomy and prior hysterectomy.  Has had a lot of nausea, but denies vomiting.  She cannot recall when her last bowel movement was.  Denies passing gas.  Has history of constipation and typically will use MiraLAX daily.  Typically will have a bowel movement once every 4 days.  Feels "swollen" and "like a tic".  HPI  Past Medical History:  Diagnosis Date  . Anemia   . Anxiety   . Asthma   . Bipolar 1 disorder ([place])   . Chronic abdominal pain   . COPD (chronic obstructive pulmonary disease) ([place])   . Depression   . Diabetes mellitus   . Diabetic neuropathy ([place]) 10/29/2017  . Esophagitis   . Hiatal hernia   . Hyperlipidemia 02/03/2012  . Migraine   . Neck pain 06/08/2012  . Pancreatitis chronic Idiopathic   Treated by Dr. Newman Pies at [facility] in the past with celiac blocks.    Patient Active Problem List   Diagnosis Date Noted  . Arteriovenous malformation (AVM)   . Symptomatic anemia   . Vitamin B12 deficiency 05/25/2019  . Melena   . History of pancreatitis 05/06/2019  . Sepsis ([place]) 05/06/2019  . Diabetic neuropathy ([place]) 10/29/2017  . DM type 2 (diabetes mellitus, type 2) ([place]) 03/28/2014  . COPD exacerbation ([place]) 03/27/2014  . CAP (community acquired pneumonia) 06/29/2013  . Acute bronchitis 06/29/2013   . Anxiety 02/27/2013  . Abdominal cramping 02/27/2013  . BRBPR (bright red blood per rectum) 12/02/2012  . Constipation/Stool Impaction 12/02/2012  . Acute blood loss anemia 12/02/2012  . Pancreatitis 11/24/2012  . Hyperglycemia, drug-induced 06/08/2012  . Arm paresthesia, left 06/07/2012  . Neck pain on left side 06/07/2012  . Asthma exacerbation 04/05/2012  . Acute otitis media 02/04/2012  . Hyperlipidemia 02/03/2012  . Acute respiratory failure with hypoxia ([place]) 09/23/2011  . Tobacco abuse 09/23/2011  . Constipation 08/29/2011  . Hypokalemia 07/15/2011  . Muscle spasm 04/24/2011  . Otitis externa 04/24/2011  . Idiopathic acute pancreatitis 04/24/2011  . ANEMIA, IRON DEFICIENCY 01/04/2009  . ANXIETY 01/04/2009  . Bipolar disorder ([place]) 01/04/2009  . MIGRAINE HEADACHE 01/04/2009  . Essential hypertension 01/04/2009  . ASTHMA 01/04/2009  . ESOPHAGEAL STRICTURE 01/04/2009  . GERD 01/04/2009  . HIATAL HERNIA 01/04/2009  . HEMATOCHEZIA 01/04/2009  . Diarrhea 01/04/2009  . ABDOMINAL PAIN 01/04/2009    Past Surgical History:  Procedure Laterality Date  . ABDOMINAL HYSTERECTOMY    . attempted colonoscopy  02/2017   Dr. Newman Pies: Poor prep, scope passed to mid transverse colon before colonoscopy aborted.  No significant findings noted to mid transverse colon.  Next colonoscopy in 2 years.  . CELIAC PLEXUS BLOCK    . CHOLECYSTECTOMY    . COLONOSCOPY N/A 12/05/2012   [id number] rectum, colon and terminal ileum  .  ESOPHAGOGASTRODUODENOSCOPY  02/20/2008   [id number] distal esophageal mucosa, suspicious for neoplasm/Hiatal hernia otherwise normal   . ESOPHAGOGASTRODUODENOSCOPY  04/03/2008   [id number] hiatal hernia, otherwise normal/Short, tight, benign-appearing peptic stricture  . ESOPHAGOGASTRODUODENOSCOPY  November 16, 2012   Dr. Britta Mccreedy: hiatal hernia, esophagitis,   . ESOPHAGOGASTRODUODENOSCOPY (EGD) WITH PROPOFOL N/A 05/09/2019   Dr. Oneida Alar: Large hiatal hernia with  erythema and edema in the pouch, mild gastritis.  No biopsies taken.  . ESOPHAGOGASTRODUODENOSCOPY (EGD) WITH PROPOFOL N/A 05/26/2019   Procedure: ESOPHAGOGASTRODUODENOSCOPY (EGD) WITH PROPOFOL;  Surgeon: Danie Binder, MD;  [facility];  Service: Endoscopy;  Laterality: N/A;  . EUS  12/2018   Dr. Newman Pies: LA grade B esophagitis, antritis but negative H. pylori, 1 cm ulcer at the duodenal sweep  . GIVENS CAPSULE STUDY N/A 12/05/2012   Few superficial erosions but nothing found to explain iron deficiency anemia.   Marland Kitchen GIVENS CAPSULE STUDY N/A 05/26/2019   Procedure: GIVENS CAPSULE STUDY;  Surgeon: Danie Binder, MD;  Location: [facility];  Service: Endoscopy;  Laterality: N/A;  . Ileocolonoscopy  06/05/2008     [id number] anal canal, otherwise normal rectum, colon  . TONSILLECTOMY      OB History    Gravida  2   Para  2   Term  2   Preterm      AB      Living        SAB      TAB      Ectopic      Multiple      Live Births               Home Medications    Prior to Admission medications   Medication Sig Start Date End Date Taking? Authorizing Provider  albuterol (PROVENTIL HFA;VENTOLIN HFA) 108 (90 BASE) MCG/ACT inhaler Inhale 2 puffs into the lungs daily as needed for wheezing. For shortness of breath    [provider]  albuterol (PROVENTIL) (2.5 MG/3ML) 0.083% nebulizer solution Take 2.5 mg by nebulization every 6 (six) hours as needed for wheezing or shortness of breath.  05/24/19   [provider]  amitriptyline (ELAVIL) 100 MG tablet Take 100 mg by mouth at bedtime.    [provider]  atorvastatin (LIPITOR) 20 MG tablet Take 20 mg by mouth daily.    [provider]  azithromycin (ZITHROMAX) 250 MG tablet Take 1 tablet (250 mg total) by mouth daily. Take first 2 tablets together, then 1 every day until finished. Patient not taking: Reported on 10/09/2019 07/25/19   Emerson Monte, FNP  bisacodyl  (DULCOLAX) 5 MG EC tablet Take 1 tablet (5 mg total) by mouth 2 (two) times daily. 10/09/19   Avier Jech C, PA-C  budesonide-formoterol (SYMBICORT) 80-4.5 MCG/ACT inhaler Inhale 2 puffs into the lungs 2 (two) times daily. 03/30/14   Samuella Cota, MD  busPIRone (BUSPAR) 10 MG tablet Take 10 mg by mouth 3 (three) times daily.    [provider]  citalopram (CELEXA) 40 MG tablet Take 40 mg by mouth daily. 05/17/19   [provider]  docusate sodium (COLACE) 100 MG capsule Take 100 mg by mouth at bedtime.     [provider]  ferrous sulfate 325 (65 FE) MG tablet Take 325 mg by mouth daily.    [provider]  gabapentin (NEURONTIN) 300 MG capsule Take 300 mg by mouth 3 (three) times daily.  04/11/19   [provider]  levothyroxine (SYNTHROID, LEVOTHROID) 25 MCG tablet Take 25 mcg by mouth daily before breakfast.    [provider]  linaclotide (LINZESS) 290 MCG CAPS capsule Take 1 capsule (290 mcg total) by mouth daily before breakfast. 05/31/19 06/30/19  Manuella Ghazi, Pratik D, DO  lisinopril (PRINIVIL,ZESTRIL) 2.5 MG tablet Take 2.5 mg by mouth daily.  01/18/18   [provider]  metFORMIN (GLUCOPHAGE) 500 MG tablet Take 2 tablets by mouth 2 (two) times daily.    [provider]  methocarbamol (ROBAXIN) 500 MG tablet Take 500 mg by mouth 3 (three) times daily.    [provider]  mupirocin cream (BACTROBAN) 2 % Apply 1 application topically 2 (two) times daily. 07/25/19   Avegno, Darrelyn Hillock, FNP  oxyCODONE (ROXICODONE) 15 MG immediate release tablet Take 15 mg by mouth 4 (four) times daily.  05/22/19   [provider]  OXYGEN Inhale 3 L into the lungs daily as needed (for shortness of breath).    [provider]  pantoprazole (PROTONIX) 40 MG tablet Take 40 mg by mouth 2 (two) times daily.    [provider]  polyethylene glycol powder (GLYCOLAX/MIRALAX) powder Take 17 g by mouth 2 (two) times  daily. Until daily soft stools  OTC Patient not taking: Reported on 06/09/2019 03/05/15   Nona Dell, PA-C  promethazine (PHENERGAN) 25 MG tablet Take 25 mg by mouth every 6 (six) hours as needed for nausea or vomiting.    [provider]  SPIRIVA HANDIHALER 18 MCG inhalation capsule Place 18 mcg into inhaler and inhale daily.  04/11/19   [provider]  valACYclovir (VALTREX) 500 MG tablet Take 500 mg by mouth daily.    [provider]    Family History Family History  Problem Relation Age of Onset  . Asthma Other   . Asthma Paternal Grandfather   . Heart attack Paternal Grandfather   . Hypertension Mother   . Cancer Maternal Grandmother   . Diabetes Paternal Grandmother   . Coronary artery disease Neg Hx   . Colon cancer Neg Hx     Social History Social History   Tobacco Use  . Smoking status: Former Research scientist (life sciences)  . Smokeless tobacco: Never Used  Substance Use Topics  . Alcohol use: No  . Drug use: No     Allergies   Penicillins, Sulfa antibiotics, Aspirin, Lovenox  [enoxaparin sodium], and Prednisone   Review of Systems Review of Systems  Constitutional: Positive for fatigue. Negative for fever.  Respiratory: Negative for shortness of breath.   Cardiovascular: Negative for chest pain.  Gastrointestinal: Positive for abdominal pain and nausea. Negative for diarrhea and vomiting.  Genitourinary: Positive for urgency. Negative for dysuria, flank pain, genital sores, hematuria, menstrual problem, vaginal bleeding, vaginal discharge and vaginal pain.  Musculoskeletal: Positive for back pain.  Skin: Negative for rash.  Neurological: Negative for dizziness, light-headedness and headaches.     Physical Exam Triage Vital Signs ED Triage Vitals  Enc Vitals Group     BP 10/09/19 1258 117/74     Pulse Rate 10/09/19 1258 (!) 104     Resp 10/09/19 1258 (!) 22     Temp 10/09/19 1258 98.8 F (37.1 C)     Temp Source 10/09/19 1258 Oral      SpO2 10/09/19 1258 91 %     Weight --      Height --      Head Circumference --      Peak Flow --  Pain Score 10/09/19 1308 4     Pain Loc --      Pain Edu? --      Excl. in [place]? --    No data found.  Updated Vital Signs BP 117/74 (BP Location: Right Arm)   Pulse (!) 104   Temp 98.8 F (37.1 C) (Oral)   Resp (!) 22   SpO2 91%   Visual Acuity Right Eye Distance:   Left Eye Distance:   Bilateral Distance:    Right Eye Near:   Left Eye Near:    Bilateral Near:     Physical Exam Vitals and nursing note reviewed.  Constitutional:      General: She is not in acute distress.    Appearance: She is well-developed.  HENT:     Head: Normocephalic and atraumatic.  Eyes:     Conjunctiva/sclera: Conjunctivae normal.  Cardiovascular:     Rate and Rhythm: Normal rate and regular rhythm.     Heart sounds: No murmur.  Pulmonary:     Effort: Pulmonary effort is normal. No respiratory distress.     Breath sounds: Normal breath sounds.     Comments: Breathing comfortably at rest, CTABL, no wheezing, rales or other adventitious sounds auscultated  Abdominal:     Palpations: Abdomen is soft.     Tenderness: There is no abdominal tenderness.     Comments: Abdomen slightly distended, but soft, generalized tenderness throughout abdomen, mor focal in mid lower abdomen, negative rebound, negative rovsings, negative mcburney  Musculoskeletal:     Cervical back: Neck supple.  Skin:    General: Skin is warm and dry.  Neurological:     Mental Status: She is alert.      UC Treatments / Results  Labs (all labs ordered are listed, but only abnormal results are displayed) Labs Reviewed  POCT URINALYSIS DIP (MANUAL ENTRY) - Abnormal; Notable for the following components:      Result Value   Leukocytes, UA Trace (*)    All other components within normal limits  POCT FASTING CBG [facility] MANUAL ENTRY - Abnormal; Notable for the following components:   POCT Glucose ([facility]) 109 (*)    All  other components within normal limits  URINE CULTURE  COMPREHENSIVE METABOLIC PANEL  CBC  LIPASE    EKG   Radiology DG Abd Acute W/Chest  Result Date: 10/09/2019 CLINICAL DATA:  Abdominal pain and bloating. Decreased stools. Urinary frequency. Cough. Smoker, history of COPD. EXAM: DG ABDOMEN ACUTE W/ 1V CHEST COMPARISON:  Abdominal radiographs 05/29/2019 chest radiograph 05/24/2019 FINDINGS: Mild bronchial thickening and interstitial coarsening. Previous bandlike opacities in the mid lung zones have resolved. Heart is normal in size. Retrocardiac hiatal hernia. No acute airspace disease. No large pleural effusion. No pneumothorax. Large volume of stool in the right colon. Gaseous distension of transverse colon which appears tortuous. Large volume of stool in the descending colon. No abnormal rectal distention. No evidence of small bowel dilatation, obstruction, or free air. Surgical clips in the right upper quadrant from presumed cholecystectomy. Pelvic phleboliths. No acute osseous abnormalities are seen. Chronic T12 compression fracture. IMPRESSION: 1. Large volume of stool in the right colon and descending colon with gaseous distended redundant transverse colon, suggesting constipation. No evidence of bowel obstruction or free air. 2. Bronchial thickening and interstitial coarsening, likely smoking related lung disease. No acute chest finding. 3. Hiatal hernia. Electronically Signed   By: Keith Rake M.D.   On: 10/09/2019 14:00    Procedures Procedures (including  critical care time)  Medications Ordered in UC Medications - No data to display  Initial Impression / Assessment and Plan / UC Course  I have reviewed the triage vital signs and the nursing notes.  Pertinent labs & imaging results that were available during my care of the patient were reviewed by me and considered in my medical decision making (see chart for details).     Trace leuks, urine culture pending. Not  suggestive of UTI. Xray suggestive of significant constipation. No obstruction. Recommended clean out followed by adding stool softener to daily miralax. Milk of magnesia as needed if not passing stools for 2-3 days.  Abdominal labs pending, will call with results.   Discussed strict return precautions. Patient verbalized understanding and is agreeable with plan.  Final Clinical Impressions(s) / UC Diagnoses   Final diagnoses:  Generalized abdominal pain  Constipation, unspecified constipation type     Discharge Instructions     Clean Out:  Limit food to liquids day of cleanout Drink plenty of water to prevent dehydration Mix 8.3 ounces of Miralax with 64 ounces gatorade Take 3 tabs of dulcolax with water 2 hours later Begin drinking gatorade- 8 ounces every 15-20 minutes May take 4-6 hour break between firstt half and second half of mixture Continue clear liqiuids After clean out return to daily capful of miralax and please pair with over the counter stool softener May use milk of magnesia if not having a bowel movement for 2-3 days Please follow up with gastroenterologist     ED Prescriptions    Medication Sig Dispense Auth. Provider   bisacodyl (DULCOLAX) 5 MG EC tablet Take 1 tablet (5 mg total) by mouth 2 (two) times daily. 14 tablet Hashir Deleeuw, [place] C, PA-C     PDMP not reviewed this encounter.   Janith Lima, [place] 10/09/19 1534

## 2019-10-09 NOTE — Discharge Instructions (Signed)
Clean Out:  Limit food to liquids day of cleanout Drink plenty of water to prevent dehydration Mix 8.3 ounces of Miralax with 64 ounces gatorade Take 3 tabs of dulcolax with water 2 hours later Begin drinking gatorade- 8 ounces every 15-20 minutes May take 4-6 hour break between firstt half and second half of mixture Continue clear liqiuids After clean out return to daily capful of miralax and please pair with over the counter stool softener May use milk of magnesia if not having a bowel movement for 2-3 days Please follow up with gastroenterologist

## 2019-10-10 ENCOUNTER — Telehealth: Payer: Self-pay | Admitting: Emergency Medicine

## 2019-10-10 LAB — URINE CULTURE: Culture: NO GROWTH

## 2019-10-10 NOTE — Telephone Encounter (Signed)
Called patient and discussed lab results of CBC, CMP and lipase.  Mildly elevated creatinine at 1.42, possible dehydration.  Recommended to push fluids.  Lab work otherwise not suggestive of abdominal emergency to follow-up in emergency room at this time.  We will proceed with constipation treatment and close monitoring.  Follow-up with PCP for recheck of kidney function.  Answered questions.

## 2019-10-15 DIAGNOSIS — J449 Chronic obstructive pulmonary disease, unspecified: Secondary | ICD-10-CM | POA: Diagnosis not present

## 2019-10-15 DIAGNOSIS — J9610 Chronic respiratory failure, unspecified whether with hypoxia or hypercapnia: Secondary | ICD-10-CM | POA: Diagnosis not present

## 2019-10-16 ENCOUNTER — Other Ambulatory Visit: Payer: Self-pay | Admitting: *Deleted

## 2019-10-16 NOTE — Patient Outreach (Signed)
[facility]) Care Management  [place]  10/16/2019   [name] [birth year] [id number]  RN Health Coach telephone call to patient.  Hipaa compliance verified. Per patient she has shortness of breath on exertion. Patient stated that she has not used her oxygen in a long time. She is using her inhalers and nebulizer as ordered. Per patient she had a recent visit to Dr for coughing up a lot of phlegm. Per patient she is on antibiotics. Patient stated she is much better.  Per patient she is not on a special diet. Patient stated she has been eating as she liked. Patient is also a diabetic with blood sugars running 80-300. Patient stated she would like to go to nutritional classes.  Patient had a fall in December and broke her arm. Patient has had multiple falls since with no injury, Patient has agreed to [employer] calls.    Encounter Medications:  Outpatient Encounter Medications as of 10/16/2019  Medication Sig  . albuterol (PROVENTIL HFA;VENTOLIN HFA) 108 (90 BASE) MCG/ACT inhaler Inhale 2 puffs into the lungs daily as needed for wheezing. For shortness of breath  . albuterol (PROVENTIL) (2.5 MG/3ML) 0.083% nebulizer solution Take 2.5 mg by nebulization every 6 (six) hours as needed for wheezing or shortness of breath.   Marland Kitchen amitriptyline (ELAVIL) 100 MG tablet Take 100 mg by mouth at bedtime.  Marland Kitchen atorvastatin (LIPITOR) 20 MG tablet Take 20 mg by mouth daily.  . bisacodyl (DULCOLAX) 5 MG EC tablet Take 1 tablet (5 mg total) by mouth 2 (two) times daily.  . budesonide-formoterol (SYMBICORT) 80-4.5 MCG/ACT inhaler Inhale 2 puffs into the lungs 2 (two) times daily.  . busPIRone (BUSPAR) 10 MG tablet Take 10 mg by mouth 3 (three) times daily.  . citalopram (CELEXA) 40 MG tablet Take 40 mg by mouth daily.  Marland Kitchen docusate sodium (COLACE) 100 MG capsule Take 100 mg by mouth at bedtime.   . ferrous sulfate 325 (65 FE) MG tablet Take 325 mg by mouth daily.  Marland Kitchen gabapentin  (NEURONTIN) 300 MG capsule Take 300 mg by mouth 3 (three) times daily.   Marland Kitchen levothyroxine (SYNTHROID, LEVOTHROID) 25 MCG tablet Take 25 mcg by mouth daily before breakfast.  . lisinopril (PRINIVIL,ZESTRIL) 2.5 MG tablet Take 2.5 mg by mouth daily.   . metFORMIN (GLUCOPHAGE) 500 MG tablet Take 2 tablets by mouth 2 (two) times daily.  . methocarbamol (ROBAXIN) 500 MG tablet Take 500 mg by mouth 3 (three) times daily.  Marland Kitchen oxyCODONE (ROXICODONE) 15 MG immediate release tablet Take 15 mg by mouth 4 (four) times daily.   . pantoprazole (PROTONIX) 40 MG tablet Take 40 mg by mouth 2 (two) times daily.  . polyethylene glycol powder (GLYCOLAX/MIRALAX) powder Take 17 g by mouth 2 (two) times daily. Until daily soft stools  OTC  . promethazine (PHENERGAN) 25 MG tablet Take 25 mg by mouth every 6 (six) hours as needed for nausea or vomiting.  Marland Kitchen SPIRIVA HANDIHALER 18 MCG inhalation capsule Place 18 mcg into inhaler and inhale daily.   . valACYclovir (VALTREX) 500 MG tablet Take 500 mg by mouth daily.  Marland Kitchen azithromycin (ZITHROMAX) 250 MG tablet Take 1 tablet (250 mg total) by mouth daily. Take first 2 tablets together, then 1 every day until finished. (Patient not taking: Reported on 10/09/2019)  . linaclotide (LINZESS) 290 MCG CAPS capsule Take 1 capsule (290 mcg total) by mouth daily before breakfast.  . mupirocin cream (BACTROBAN) 2 % Apply 1 application topically  2 (two) times daily. (Patient not taking: Reported on 10/16/2019)  . OXYGEN Inhale 3 L into the lungs daily as needed (for shortness of breath).   No facility-administered encounter medications on file as of 10/16/2019.    Functional Status:  In your present state of health, do you have any difficulty performing the following activities: 10/16/2019 05/24/2019  Hearing? N -  Vision? N -  Difficulty concentrating or making decisions? N -  Walking or climbing stairs? Y -  Comment per patient some shortness of breath and dizziness -  Dressing or  bathing? N -  Doing errands, shopping? N N  Preparing Food and eating ? N -  Using the Toilet? N -  In the past six months, have you accidently leaked urine? N -  Do you have problems with loss of bowel control? N -  Managing your Medications? N -  Managing your Finances? N -  Housekeeping or managing your Housekeeping? N -  Some recent data might be hidden    Fall/Depression Screening: Fall Risk  10/16/2019 10/16/2019 08/01/2019  Falls in the past year? 1 1 1   Number falls in past yr: 1 - 1  Injury with Fall? 1 - 1  Comment broke arm in Dec - -  Risk Factor Category  - - -  Risk for fall due to : Impaired balance/gait;History of fall(s);Impaired mobility - History of fall(s)  Follow up Falls evaluation completed;Education provided;Falls prevention discussed - Falls evaluation completed;Falls prevention discussed   PHQ 2/9 Scores 10/16/2019 02/16/2019  PHQ - 2 Score 2 2  PHQ- 9 Score - 9   THN CM Care Plan Problem One     Most Recent Value  Care Plan Problem One  Knowledge deficit in Self management in COPD  Role Documenting the Problem One  [place] for Problem One  Active  THN Long Term Goal   Patient will verbalize not having any admissions for COPD within the next 90 days  THN Long Term Goal Start Date  10/16/19  Interventions for Problem One Long Term Goal  RN discussed COPD exacerbation zones and action plan. RN sent COPD educational material. RN will follow up with further discussion  THN CM Short Term Goal #1   Patient will verbalize understanding COPD eating plan within the next 30 days  THN CM Short Term Goal #1 Start Date  10/16/19  Interventions for Short Term Goal #1  RN discussed eating small frequent meals. RN sent Clinical key education on COPD and eating plan. RN will follow up with further discussion  THN CM Short Term Goal #2   Patient will verbalize understanding COPD and rest within the next 30 days.  THN CM Short Term Goal #2 Start Date  10/16/19   Interventions for Short Term Goal #2  Patient will verbalize understanding COPD and activity plan. RN discussed taking frequent rest time. RN sent educational material on COPD and activity. RN will follow up with further discussion.       Assessment:  Patient has not received the COVID vaccine Patient is not on a special diet Patient would like to go to nutritional classes Patient has a scale and blood pressure monitor Patient will benefit from [place] telephonic outreach for education and support for COPD self management. Plan:  RN sent 2021 Calendar book for documentation of CBG, Blood pressure and daily weight RN discussed the COVID vaccine RN discussed eating plan RN sent educational material on COPD and eating  Plan RN sent educational material on COPD on activity RN sent educational material on COPD exacerbation RN sent a barriers letter and assessment to PCP RN will follow up within the month of May  Radames Mejorado [facility] [phone]

## 2019-10-25 DIAGNOSIS — J449 Chronic obstructive pulmonary disease, unspecified: Secondary | ICD-10-CM | POA: Diagnosis not present

## 2019-10-25 DIAGNOSIS — Z87891 Personal history of nicotine dependence: Secondary | ICD-10-CM | POA: Diagnosis not present

## 2019-10-25 DIAGNOSIS — E1140 Type 2 diabetes mellitus with diabetic neuropathy, unspecified: Secondary | ICD-10-CM | POA: Diagnosis not present

## 2019-10-25 DIAGNOSIS — J45909 Unspecified asthma, uncomplicated: Secondary | ICD-10-CM | POA: Diagnosis not present

## 2019-11-01 DIAGNOSIS — G894 Chronic pain syndrome: Secondary | ICD-10-CM | POA: Diagnosis not present

## 2019-11-14 ENCOUNTER — Ambulatory Visit
Admission: EM | Admit: 2019-11-14 | Discharge: 2019-11-14 | Disposition: A | Discharge status: Home / Self Care | Payer: Medicare Other | Attending: Emergency Medicine | Admitting: Emergency Medicine

## 2019-11-14 ENCOUNTER — Other Ambulatory Visit: Payer: Self-pay

## 2019-11-14 ENCOUNTER — Ambulatory Visit (INDEPENDENT_AMBULATORY_CARE_PROVIDER_SITE_OTHER): Payer: Medicare Other

## 2019-11-14 ENCOUNTER — Encounter: Payer: Self-pay | Admitting: Emergency Medicine

## 2019-11-14 DIAGNOSIS — R0602 Shortness of breath: Secondary | ICD-10-CM | POA: Diagnosis not present

## 2019-11-14 DIAGNOSIS — M549 Dorsalgia, unspecified: Secondary | ICD-10-CM | POA: Diagnosis not present

## 2019-11-14 DIAGNOSIS — J441 Chronic obstructive pulmonary disease with (acute) exacerbation: Secondary | ICD-10-CM

## 2019-11-14 DIAGNOSIS — Z1152 Encounter for screening for COVID-19: Secondary | ICD-10-CM

## 2019-11-14 DIAGNOSIS — R05 Cough: Secondary | ICD-10-CM

## 2019-11-14 DIAGNOSIS — R059 Cough, unspecified: Secondary | ICD-10-CM

## 2019-11-14 DIAGNOSIS — R0781 Pleurodynia: Secondary | ICD-10-CM

## 2019-11-14 MED ORDER — AZITHROMYCIN 250 MG PO TABS: 250.0000 mg | ORAL_TABLET | Freq: Every day | ORAL | 0 refills | Status: DC

## 2019-11-14 MED ORDER — PREDNISONE 10 MG PO TABS: 20.0000 mg | ORAL_TABLET | Freq: Every day | ORAL | 0 refills | Status: AC

## 2019-11-14 NOTE — ED Triage Notes (Signed)
Pt here for right rib and back pain with hx of fall; pt sts hx of COPD; pt sts some blood tinged sputum and sts pain with inspiration

## 2019-11-14 NOTE — ED Provider Notes (Signed)
[facility]    CSN: [id number] Arrival date & time: 11/14/19  1001      History   Chief Complaint Chief Complaint  Patient presents with   rib pain    HPI [name] is a 49 y.o. female.   with history of COPD presented to the urgent care with a complaint of cough, right rib and back pain from the past 3 days.  Reported she fell on her right side 2 days ago.  Stated she had a blood-tinged sputum and pain with respiration.  Denies sick exposure to COVID, flu or strep.  Denies recent travel.  Denies aggravating or alleviating symptoms.  Denies previous COVID infection.  Denies fever, chills, fatigue, nasal congestion, rhinorrhea, sore throat, SOB, wheezing, chest pain, nausea, vomiting, changes in bowel or bladder habits.    The history is provided by the patient. No language interpreter was used.    Past Medical History:  Diagnosis Date   Anemia    Anxiety    Asthma    Bipolar 1 disorder ([place])    Chronic abdominal pain    COPD (chronic obstructive pulmonary disease) ([place])    Depression    Diabetes mellitus    Diabetic neuropathy ([place]) 10/29/2017   Esophagitis    Hiatal hernia    Hyperlipidemia 02/03/2012   Migraine    Neck pain 06/08/2012   Pancreatitis chronic Idiopathic   Treated by Dr. Newman Pies at [facility] in the past with celiac blocks.    Patient Active Problem List   Diagnosis Date Noted   Arteriovenous malformation (AVM)    Symptomatic anemia    Vitamin B12 deficiency 05/25/2019   Melena    History of pancreatitis 05/06/2019   Sepsis ([place]) 05/06/2019   Diabetic neuropathy ([place]) 10/29/2017   DM type 2 (diabetes mellitus, type 2) ([place]) 03/28/2014   COPD exacerbation ([place]) 03/27/2014   CAP (community acquired pneumonia) 06/29/2013   Acute bronchitis 06/29/2013   Anxiety 02/27/2013   Abdominal cramping 02/27/2013   BRBPR (bright red blood per rectum) 12/02/2012   Constipation/Stool Impaction 12/02/2012     Acute blood loss anemia 12/02/2012   Pancreatitis 11/24/2012   Hyperglycemia, drug-induced 06/08/2012   Arm paresthesia, left 06/07/2012   Neck pain on left side 06/07/2012   Asthma exacerbation 04/05/2012   Acute otitis media 02/04/2012   Hyperlipidemia 02/03/2012   Acute respiratory failure with hypoxia ([place]) 09/23/2011   Tobacco abuse 09/23/2011   Constipation 08/29/2011   Hypokalemia 07/15/2011   Muscle spasm 04/24/2011   Otitis externa 04/24/2011   Idiopathic acute pancreatitis 04/24/2011   ANEMIA, IRON DEFICIENCY 01/04/2009   ANXIETY 01/04/2009   Bipolar disorder ([place]) 01/04/2009   MIGRAINE HEADACHE 01/04/2009   Essential hypertension 01/04/2009   ASTHMA 01/04/2009   ESOPHAGEAL STRICTURE 01/04/2009   GERD 01/04/2009   HIATAL HERNIA 01/04/2009   HEMATOCHEZIA 01/04/2009   Diarrhea 01/04/2009   ABDOMINAL PAIN 01/04/2009    Past Surgical History:  Procedure Laterality Date   ABDOMINAL HYSTERECTOMY     attempted colonoscopy  02/2017   Dr. Newman Pies: Poor prep, scope passed to mid transverse colon before colonoscopy aborted.  No significant findings noted to mid transverse colon.  Next colonoscopy in 2 years.   CELIAC PLEXUS BLOCK     CHOLECYSTECTOMY     COLONOSCOPY N/A 12/05/2012   [id number] rectum, colon and terminal ileum   ESOPHAGOGASTRODUODENOSCOPY  02/20/2008   [id number] distal esophageal mucosa, suspicious for neoplasm/Hiatal hernia otherwise normal    ESOPHAGOGASTRODUODENOSCOPY  04/03/2008  [id number] hiatal hernia, otherwise normal/Short, tight, benign-appearing peptic stricture   ESOPHAGOGASTRODUODENOSCOPY  November 16, 2012   Dr. Britta Mccreedy: hiatal hernia, esophagitis,    ESOPHAGOGASTRODUODENOSCOPY (EGD) WITH PROPOFOL N/A 05/09/2019   Dr. Oneida Alar: Large hiatal hernia with erythema and edema in the pouch, mild gastritis.  No biopsies taken.   ESOPHAGOGASTRODUODENOSCOPY (EGD) WITH PROPOFOL N/A 05/26/2019   Procedure:  ESOPHAGOGASTRODUODENOSCOPY (EGD) WITH PROPOFOL;  Surgeon: Danie Binder, MD;  [facility];  Service: Endoscopy;  Laterality: N/A;   EUS  12/2018   Dr. Newman Pies: LA grade B esophagitis, antritis but negative H. pylori, 1 cm ulcer at the duodenal sweep   GIVENS CAPSULE STUDY N/A 12/05/2012   Few superficial erosions but nothing found to explain iron deficiency anemia.    GIVENS CAPSULE STUDY N/A 05/26/2019   Procedure: GIVENS CAPSULE STUDY;  Surgeon: Danie Binder, MD;  Location: [facility];  Service: Endoscopy;  Laterality: N/A;   Ileocolonoscopy  06/05/2008     [id number] anal canal, otherwise normal rectum, colon   TONSILLECTOMY      OB History    Gravida  2   Para  2   Term  2   Preterm      AB      Living        SAB      TAB      Ectopic      Multiple      Live Births               Home Medications    Prior to Admission medications   Medication Sig Start Date End Date Taking? Authorizing Provider  albuterol (PROVENTIL HFA;VENTOLIN HFA) 108 (90 BASE) MCG/ACT inhaler Inhale 2 puffs into the lungs daily as needed for wheezing. For shortness of breath    [provider]  albuterol (PROVENTIL) (2.5 MG/3ML) 0.083% nebulizer solution Take 2.5 mg by nebulization every 6 (six) hours as needed for wheezing or shortness of breath.  05/24/19   [provider]  amitriptyline (ELAVIL) 100 MG tablet Take 100 mg by mouth at bedtime.    [provider]  atorvastatin (LIPITOR) 20 MG tablet Take 20 mg by mouth daily.    [provider]  azithromycin (ZITHROMAX) 250 MG tablet Take 1 tablet (250 mg total) by mouth daily. Take first 2 tablets together, then 1 every day until finished. 11/14/19   Quindon Denker, Darrelyn Hillock, FNP  bisacodyl (DULCOLAX) 5 MG EC tablet Take 1 tablet (5 mg total) by mouth 2 (two) times daily. 10/09/19   Wieters, Hallie C, PA-C  budesonide-formoterol (SYMBICORT) 80-4.5 MCG/ACT inhaler Inhale 2 puffs into the  lungs 2 (two) times daily. 03/30/14   Samuella Cota, MD  busPIRone (BUSPAR) 10 MG tablet Take 10 mg by mouth 3 (three) times daily.    [provider]  citalopram (CELEXA) 40 MG tablet Take 40 mg by mouth daily. 05/17/19   [provider]  docusate sodium (COLACE) 100 MG capsule Take 100 mg by mouth at bedtime.     [provider]  ferrous sulfate 325 (65 FE) MG tablet Take 325 mg by mouth daily.    [provider]  gabapentin (NEURONTIN) 300 MG capsule Take 300 mg by mouth 3 (three) times daily.  04/11/19   [provider]  levothyroxine (SYNTHROID, LEVOTHROID) 25 MCG tablet Take 25 mcg by mouth daily before breakfast.    [provider]  linaclotide (LINZESS) 290 MCG CAPS capsule Take 1  capsule (290 mcg total) by mouth daily before breakfast. 05/31/19 06/30/19  Manuella Ghazi, Pratik D, DO  lisinopril (PRINIVIL,ZESTRIL) 2.5 MG tablet Take 2.5 mg by mouth daily.  01/18/18   [provider]  metFORMIN (GLUCOPHAGE) 500 MG tablet Take 2 tablets by mouth 2 (two) times daily.    [provider]  methocarbamol (ROBAXIN) 500 MG tablet Take 500 mg by mouth 3 (three) times daily.    [provider]  mupirocin cream (BACTROBAN) 2 % Apply 1 application topically 2 (two) times daily. Patient not taking: Reported on 10/16/2019 07/25/19   Emerson Monte, FNP  oxyCODONE (ROXICODONE) 15 MG immediate release tablet Take 15 mg by mouth 4 (four) times daily.  05/22/19   [provider]  OXYGEN Inhale 3 L into the lungs daily as needed (for shortness of breath).    [provider]  pantoprazole (PROTONIX) 40 MG tablet Take 40 mg by mouth 2 (two) times daily.    [provider]  polyethylene glycol powder (GLYCOLAX/MIRALAX) powder Take 17 g by mouth 2 (two) times daily. Until daily soft stools  OTC 03/05/15   Nona Dell, PA-C  predniSONE (DELTASONE) 10 MG tablet Take 2 tablets (20 mg total) by mouth daily  for 5 days. 11/14/19 11/19/19  Livi Mcgann, Darrelyn Hillock, FNP  promethazine (PHENERGAN) 25 MG tablet Take 25 mg by mouth every 6 (six) hours as needed for nausea or vomiting.    [provider]  SPIRIVA HANDIHALER 18 MCG inhalation capsule Place 18 mcg into inhaler and inhale daily.  04/11/19   [provider]  valACYclovir (VALTREX) 500 MG tablet Take 500 mg by mouth daily.    [provider]    Family History Family History  Problem Relation Age of Onset   Asthma Other    Asthma Paternal Grandfather    Heart attack Paternal Grandfather    Hypertension Mother    Cancer Maternal Grandmother    Diabetes Paternal Grandmother    Coronary artery disease Neg Hx    Colon cancer Neg Hx     Social History Social History   Tobacco Use   Smoking status: Former Smoker   Smokeless tobacco: Never Used  Substance Use Topics   Alcohol use: No   Drug use: No     Allergies   Penicillins, Sulfa antibiotics, Aspirin, Lovenox  [enoxaparin sodium], and Prednisone   Review of Systems Review of Systems  Constitutional: Negative.   Respiratory: Positive for cough.   Cardiovascular: Negative.   Musculoskeletal: Positive for back pain.       Rib cage pain  Neurological: Negative.   All other systems reviewed and are negative.    Physical Exam Triage Vital Signs ED Triage Vitals  Enc Vitals Group     BP      Pulse      Resp      Temp      Temp src      SpO2      Weight      Height      Head Circumference      Peak Flow      Pain Score      Pain Loc      Pain Edu?      Excl. in [place]?    No data found.  Updated Vital Signs BP 116/64 (BP Location: Right Arm)    Pulse (!) 107    Temp 98.8 F (37.1 C) (Oral)    Resp 18  SpO2 94%   Visual Acuity Right Eye Distance:   Left Eye Distance:   Bilateral Distance:    Right Eye Near:   Left Eye Near:    Bilateral Near:     Physical Exam Vitals and nursing note reviewed.  Constitutional:       General: She is not in acute distress.    Appearance: Normal appearance. She is normal weight. She is not ill-appearing, toxic-appearing or diaphoretic.  Cardiovascular:     Rate and Rhythm: Regular rhythm. Tachycardia present.     Pulses: Normal pulses.     Heart sounds: Normal heart sounds. No murmur. No friction rub. No gallop.   Pulmonary:     Effort: Pulmonary effort is normal. No respiratory distress.     Breath sounds: No stridor. Rales present. No wheezing or rhonchi.  Chest:     Chest wall: No tenderness.  Abdominal:     General: There is no distension.     Palpations: There is no mass.     Tenderness: There is no abdominal tenderness. There is right CVA tenderness. There is no guarding or rebound.     Hernia: No hernia is present.  Musculoskeletal:        General: Tenderness present. No swelling or deformity.     Thoracic back: Tenderness present.       Back:     Comments: Tenderness to touch and palpation  Neurological:     Mental Status: She is alert.      UC Treatments / Results  Labs (all labs ordered are listed, but only abnormal results are displayed) Labs Reviewed - No data to display  EKG   Radiology DG Chest 2 View  Result Date: 11/14/2019 CLINICAL DATA:  Fall 2 days ago. Lower right anterior rib pain. Shortness of breath and cough. EXAM: CHEST - 2 VIEW COMPARISON:  05/24/2019 FINDINGS: Normal heart size. Moderate size hiatal hernia identified. No pleural effusion identified. There has been interval progression of diffuse increased interstitial opacities throughout both lungs. No displaced rib fractures. Unchanged superior endplate T12 compression deformity. IMPRESSION: Progression of diffuse increased interstitial opacities throughout both lungs. This may represent pulmonary edema versus atypical infection. Electronically Signed   By: Kerby Moors M.D.   On: 11/14/2019 11:04    Procedures Procedures (including critical care time)  Medications Ordered  in UC Medications - No data to display  Initial Impression / Assessment and Plan / UC Course  I have reviewed the triage vital signs and the nursing notes.  Pertinent labs & imaging results that were available during my care of the patient were reviewed by me and considered in my medical decision making (see chart for details).     Patient is stable for discharge.  Chest x-ray is negative for bony abnormality including fracture.  There is a possibility of pulmonary edema versus atypical infection I have reviewed the x-ray myself and the radiologist interpretation.  I am in agreement with the radiologist interpretation.  Was advised to follow PCP.  Azithromycin and prednisone were prescribed. Advised to continue to take ProAir, Symbicort and Singulair as prescribed  Final Clinical Impressions(s) / UC Diagnoses   Final diagnoses:  Costovertebral angle pain  Cough  COPD exacerbation [facility])     Discharge Instructions     Follow-up with a pulmonologist Azithromycin was prescribed take as directed Prednisone was prescribed take as directed Continue to take ProAir, Symbicort and Singulair as prescribed     ED Prescriptions    Medication  Sig Dispense Auth. Provider   azithromycin (ZITHROMAX) 250 MG tablet Take 1 tablet (250 mg total) by mouth daily. Take first 2 tablets together, then 1 every day until finished. 6 tablet Daily Doe S, FNP   predniSONE (DELTASONE) 10 MG tablet Take 2 tablets (20 mg total) by mouth daily for 5 days. 10 tablet Dollene Mallery, Darrelyn Hillock, FNP     PDMP not reviewed this encounter.   Emerson Monte, [place] 11/14/19 1159

## 2019-11-14 NOTE — Discharge Instructions (Addendum)
Follow-up with a pulmonologist Azithromycin was prescribed take as directed Prednisone was prescribed take as directed Continue to take ProAir, Symbicort and Singulair as prescribed

## 2019-11-15 DIAGNOSIS — J449 Chronic obstructive pulmonary disease, unspecified: Secondary | ICD-10-CM | POA: Diagnosis not present

## 2019-11-15 DIAGNOSIS — J9610 Chronic respiratory failure, unspecified whether with hypoxia or hypercapnia: Secondary | ICD-10-CM | POA: Diagnosis not present

## 2019-11-15 LAB — NOVEL CORONAVIRUS, NAA: SARS-CoV-2, NAA: NOT DETECTED

## 2019-11-15 LAB — SARS-COV-2, NAA 2 DAY TAT

## 2019-11-21 ENCOUNTER — Ambulatory Visit (HOSPITAL_COMMUNITY)
Admission: RE | Admit: 2019-11-21 | Discharge: 2019-11-21 | Disposition: A | Discharge status: Home / Self Care | Payer: Medicare Other | Source: Ambulatory Visit | Attending: Physician Assistant | Admitting: Physician Assistant

## 2019-11-21 ENCOUNTER — Other Ambulatory Visit: Payer: Self-pay

## 2019-11-21 ENCOUNTER — Other Ambulatory Visit (HOSPITAL_COMMUNITY): Payer: Self-pay | Admitting: Physician Assistant

## 2019-11-21 DIAGNOSIS — R079 Chest pain, unspecified: Secondary | ICD-10-CM | POA: Diagnosis not present

## 2019-11-21 DIAGNOSIS — R05 Cough: Secondary | ICD-10-CM | POA: Diagnosis not present

## 2019-11-27 DIAGNOSIS — Z7984 Long term (current) use of oral hypoglycemic drugs: Secondary | ICD-10-CM | POA: Diagnosis not present

## 2019-11-27 DIAGNOSIS — K861 Other chronic pancreatitis: Secondary | ICD-10-CM | POA: Diagnosis not present

## 2019-11-27 DIAGNOSIS — J449 Chronic obstructive pulmonary disease, unspecified: Secondary | ICD-10-CM | POA: Diagnosis not present

## 2019-11-27 DIAGNOSIS — E119 Type 2 diabetes mellitus without complications: Secondary | ICD-10-CM | POA: Diagnosis not present

## 2019-11-27 DIAGNOSIS — R1013 Epigastric pain: Secondary | ICD-10-CM | POA: Diagnosis not present

## 2019-11-27 DIAGNOSIS — Z79899 Other long term (current) drug therapy: Secondary | ICD-10-CM | POA: Diagnosis not present

## 2019-11-27 DIAGNOSIS — R110 Nausea: Secondary | ICD-10-CM | POA: Diagnosis not present

## 2019-11-27 DIAGNOSIS — R1084 Generalized abdominal pain: Secondary | ICD-10-CM | POA: Diagnosis not present

## 2019-11-27 DIAGNOSIS — E039 Hypothyroidism, unspecified: Secondary | ICD-10-CM | POA: Diagnosis not present

## 2019-12-01 DIAGNOSIS — N390 Urinary tract infection, site not specified: Secondary | ICD-10-CM | POA: Diagnosis not present

## 2019-12-01 DIAGNOSIS — I10 Essential (primary) hypertension: Secondary | ICD-10-CM | POA: Diagnosis not present

## 2019-12-01 DIAGNOSIS — R109 Unspecified abdominal pain: Secondary | ICD-10-CM | POA: Diagnosis not present

## 2019-12-01 DIAGNOSIS — Z6822 Body mass index (BMI) 22.0-22.9, adult: Secondary | ICD-10-CM | POA: Diagnosis not present

## 2019-12-01 DIAGNOSIS — K219 Gastro-esophageal reflux disease without esophagitis: Secondary | ICD-10-CM | POA: Diagnosis not present

## 2019-12-01 DIAGNOSIS — J449 Chronic obstructive pulmonary disease, unspecified: Secondary | ICD-10-CM | POA: Diagnosis not present

## 2019-12-01 DIAGNOSIS — G4736 Sleep related hypoventilation in conditions classified elsewhere: Secondary | ICD-10-CM | POA: Diagnosis not present

## 2019-12-01 DIAGNOSIS — K861 Other chronic pancreatitis: Secondary | ICD-10-CM | POA: Diagnosis not present

## 2019-12-01 DIAGNOSIS — F419 Anxiety disorder, unspecified: Secondary | ICD-10-CM | POA: Diagnosis not present

## 2019-12-02 DIAGNOSIS — Z880 Allergy status to penicillin: Secondary | ICD-10-CM | POA: Diagnosis not present

## 2019-12-02 DIAGNOSIS — R0902 Hypoxemia: Secondary | ICD-10-CM | POA: Diagnosis not present

## 2019-12-02 DIAGNOSIS — R109 Unspecified abdominal pain: Secondary | ICD-10-CM | POA: Diagnosis not present

## 2019-12-02 DIAGNOSIS — E119 Type 2 diabetes mellitus without complications: Secondary | ICD-10-CM | POA: Diagnosis not present

## 2019-12-02 DIAGNOSIS — M2550 Pain in unspecified joint: Secondary | ICD-10-CM | POA: Diagnosis not present

## 2019-12-02 DIAGNOSIS — R1031 Right lower quadrant pain: Secondary | ICD-10-CM | POA: Diagnosis not present

## 2019-12-02 DIAGNOSIS — K449 Diaphragmatic hernia without obstruction or gangrene: Secondary | ICD-10-CM | POA: Diagnosis not present

## 2019-12-02 DIAGNOSIS — N179 Acute kidney failure, unspecified: Secondary | ICD-10-CM | POA: Diagnosis not present

## 2019-12-02 DIAGNOSIS — F329 Major depressive disorder, single episode, unspecified: Secondary | ICD-10-CM | POA: Diagnosis not present

## 2019-12-02 DIAGNOSIS — Z882 Allergy status to sulfonamides status: Secondary | ICD-10-CM | POA: Diagnosis not present

## 2019-12-02 DIAGNOSIS — Z20822 Contact with and (suspected) exposure to covid-19: Secondary | ICD-10-CM | POA: Diagnosis not present

## 2019-12-02 DIAGNOSIS — J449 Chronic obstructive pulmonary disease, unspecified: Secondary | ICD-10-CM | POA: Diagnosis not present

## 2019-12-02 DIAGNOSIS — E039 Hypothyroidism, unspecified: Secondary | ICD-10-CM | POA: Diagnosis not present

## 2019-12-02 DIAGNOSIS — G894 Chronic pain syndrome: Secondary | ICD-10-CM | POA: Diagnosis not present

## 2019-12-02 DIAGNOSIS — N10 Acute tubulo-interstitial nephritis: Secondary | ICD-10-CM | POA: Diagnosis not present

## 2019-12-06 DIAGNOSIS — M5134 Other intervertebral disc degeneration, thoracic region: Secondary | ICD-10-CM | POA: Diagnosis not present

## 2019-12-06 DIAGNOSIS — M5410 Radiculopathy, site unspecified: Secondary | ICD-10-CM | POA: Diagnosis not present

## 2019-12-14 ENCOUNTER — Other Ambulatory Visit: Payer: Self-pay | Admitting: *Deleted

## 2019-12-15 NOTE — Patient Outreach (Signed)
[facility]) Care Management  [id number]  AMBRI DA 17-Feb-1971 [id number]   RN Health Coach telephone call to patient.  [employer] compliance verified. Per patient she has not been feeling well for the last couple of months. Per patient she has had rt side and back pain. She is on antibiotics for UTI. Patient has hx of COPD. Patient stated that she had been coughing up phlegm and was diagnosed with bronchitis. Patient stated that she is on a BIPAP. She stated she was placed on it 2 years ago in the hospital. Per patient she does not have a pulmonologist and no one is monitoring the BIPAP. Per patient when she uses it her abdomen swells. Patient has changed insurances.   Plan: RN will follow up call with patient [phone]. RN will check to see if patient is getting replacement tubing and cleaning machine.  Patient will call a pulmonologist now and get an appointment RN Health Coach will close case and refer patient to the [facility] program of [facility] [phone]

## 2019-12-18 DIAGNOSIS — M5134 Other intervertebral disc degeneration, thoracic region: Secondary | ICD-10-CM | POA: Diagnosis not present

## 2019-12-28 DIAGNOSIS — Z1389 Encounter for screening for other disorder: Secondary | ICD-10-CM | POA: Diagnosis not present

## 2019-12-28 DIAGNOSIS — G894 Chronic pain syndrome: Secondary | ICD-10-CM | POA: Diagnosis not present

## 2019-12-28 DIAGNOSIS — Z0001 Encounter for general adult medical examination with abnormal findings: Secondary | ICD-10-CM | POA: Diagnosis not present

## 2019-12-28 DIAGNOSIS — E119 Type 2 diabetes mellitus without complications: Secondary | ICD-10-CM | POA: Diagnosis not present

## 2019-12-28 DIAGNOSIS — Z6823 Body mass index (BMI) 23.0-23.9, adult: Secondary | ICD-10-CM | POA: Diagnosis not present

## 2020-01-16 DIAGNOSIS — M5134 Other intervertebral disc degeneration, thoracic region: Secondary | ICD-10-CM | POA: Diagnosis not present

## 2020-01-16 DIAGNOSIS — I10 Essential (primary) hypertension: Secondary | ICD-10-CM | POA: Diagnosis not present

## 2020-01-25 DIAGNOSIS — L84 Corns and callosities: Secondary | ICD-10-CM | POA: Diagnosis not present

## 2020-01-25 DIAGNOSIS — Z6823 Body mass index (BMI) 23.0-23.9, adult: Secondary | ICD-10-CM | POA: Diagnosis not present

## 2020-01-25 DIAGNOSIS — E1140 Type 2 diabetes mellitus with diabetic neuropathy, unspecified: Secondary | ICD-10-CM | POA: Diagnosis not present

## 2020-01-25 DIAGNOSIS — G894 Chronic pain syndrome: Secondary | ICD-10-CM | POA: Diagnosis not present

## 2020-01-26 ENCOUNTER — Ambulatory Visit (INDEPENDENT_AMBULATORY_CARE_PROVIDER_SITE_OTHER): Payer: Medicare HMO | Admitting: Podiatry

## 2020-01-26 ENCOUNTER — Encounter: Payer: Self-pay | Admitting: Podiatry

## 2020-01-26 ENCOUNTER — Other Ambulatory Visit: Payer: Self-pay

## 2020-01-26 ENCOUNTER — Ambulatory Visit (INDEPENDENT_AMBULATORY_CARE_PROVIDER_SITE_OTHER): Payer: Medicare HMO

## 2020-01-26 DIAGNOSIS — L84 Corns and callosities: Secondary | ICD-10-CM

## 2020-01-26 DIAGNOSIS — E1142 Type 2 diabetes mellitus with diabetic polyneuropathy: Secondary | ICD-10-CM

## 2020-01-26 DIAGNOSIS — M79674 Pain in right toe(s): Secondary | ICD-10-CM

## 2020-01-26 DIAGNOSIS — S90852A Superficial foreign body, left foot, initial encounter: Secondary | ICD-10-CM

## 2020-01-26 DIAGNOSIS — E119 Type 2 diabetes mellitus without complications: Secondary | ICD-10-CM

## 2020-01-26 DIAGNOSIS — B351 Tinea unguium: Secondary | ICD-10-CM | POA: Diagnosis not present

## 2020-01-26 DIAGNOSIS — M79675 Pain in left toe(s): Secondary | ICD-10-CM | POA: Diagnosis not present

## 2020-01-26 MED ORDER — MUPIROCIN 2 % EX OINT: TOPICAL_OINTMENT | CUTANEOUS | 1 refills | Status: AC

## 2020-01-26 MED ORDER — DOXYCYCLINE HYCLATE 100 MG PO CAPS: 100.0000 mg | ORAL_CAPSULE | Freq: Two times a day (BID) | ORAL | 0 refills | Status: AC

## 2020-01-26 NOTE — Patient Instructions (Addendum)
DRESSING CHANGES left foot and right foot FOOT:    1. KEEP left foot and right foot  FOOT CLEAN AT ALL TIMES!!!!  2. APPLY A LIGHT AMOUNT OF Mupirocin Ointment to affected areas of both feet once daily  3.  APPLY FABRIC BAND-AID.  4.  DO NOT WALK BAREFOOT!!!  5.  IF YOU EXPERIENCE ANY FEVER, CHILLS, NIGHTSWEATS, NAUSEA OR VOMITING, ELEVATED OR LOW BLOOD SUGARS, REPORT TO EMERGENCY ROOM.  6.  IF YOU EXPERIENCE INCREASED REDNESS, PAIN, SWELLING, DISCOLORATION, ODOR, PUS, DRAINAGE OR WARMTH OF YOUR FOOT, REPORT TO EMERGENCY ROOM.  Hammer Toe  Hammer toe is a change in the shape (a deformity) of your toe. The deformity causes the middle joint of your toe to stay bent. This causes pain, especially when you are wearing shoes. Hammer toe starts gradually. At first, the toe can be straightened. Gradually over time, the deformity becomes stiff and permanent. Early treatments to keep the toe straight may relieve pain. As the deformity becomes stiff and permanent, surgery may be needed to straighten the toe. What are the causes? Hammer toe is caused by abnormal bending of the toe joint that is closest to your foot. It happens gradually over time. This pulls on the muscles and connections (tendons) of the toe joint, making them weak and stiff. It is often related to wearing shoes that are too short or narrow and do not let your toes straighten. What increases the risk? You may be at greater risk for hammer toe if you:  Are female.  Are older.  Wear shoes that are too small.  Wear high-heeled shoes that pinch your toes.  Are a Engineer, mining.  Have a second toe that is longer than your big toe (first toe).  Injure your foot or toe.  Have arthritis.  Have a family history of hammer toe.  Have a nerve or muscle disorder. What are the signs or symptoms? The main symptoms of this condition are pain and deformity of the toe. The pain is worse when wearing shoes, walking, or running. Other  symptoms may include:  Corns or calluses over the bent part of the toe or between the toes.  Redness and a burning feeling on the toe.  An open sore that forms on the top of the toe.  Not being able to straighten the toe. How is this diagnosed? This condition is diagnosed based on your symptoms and a physical exam. During the exam, your health care provider will try to straighten your toe to see how stiff the deformity is. You may also have tests, such as:  A blood test to check for rheumatoid arthritis.  An X-ray to show how severe the deformity is. How is this treated? Treatment for this condition will depend on how stiff the deformity is. Surgery is often needed. However, sometimes a hammer toe can be straightened without surgery. Treatments that do not involve surgery include:  Taping the toe into a straightened position.  Using pads and cushions to protect the toe (orthotics).  Wearing shoes that provide enough room for the toes.  Doing toe-stretching exercises at home.  Taking an NSAID to reduce pain and swelling. If these treatments do not help or the toe cannot be straightened, surgery is the next option. The most common surgeries used to straighten a hammer toe include:  Arthroplasty. In this procedure, part of the joint is removed, and that allows the toe to straighten.  Fusion. In this procedure, cartilage between the two bones  of the joint is taken out and the bones are fused together into one longer bone.  Implantation. In this procedure, part of the bone is removed and replaced with an implant to let the toe move again.  Flexor tendon transfer. In this procedure, the tendons that curl the toes down (flexor tendons) are repositioned. Follow these instructions at home:  Take over-the-counter and prescription medicines only as told by your health care provider.  Do toe straightening and stretching exercises as told by your health care provider.  Keep all follow-up  visits as told by your health care provider. This is important. How is this prevented?  Wear shoes that give your toes enough room and do not cause pain.  Do not wear high-heeled shoes. Contact a health care provider if:  Your pain gets worse.  Your toe becomes red or swollen.  You develop an open sore on your toe. This information is not intended to replace advice given to you by your health care provider. Make sure you discuss any questions you have with your health care provider. Document Revised: 06/25/2017 Document Reviewed: 11/06/2015 Elsevier Patient Education  [place].   Diabetic Neuropathy Diabetic neuropathy refers to nerve damage that is caused by diabetes (diabetes mellitus). Over time, people with diabetes can develop nerve damage throughout the body. There are several types of diabetic neuropathy:  Peripheral neuropathy. This is the most common type of diabetic neuropathy. It causes damage to nerves that carry signals between the spinal cord and other parts of the body (peripheral nerves). This usually affects nerves in the feet and legs first, and may eventually affect the hands and arms. The damage affects the ability to sense touch or temperature.  Autonomic neuropathy. This type causes damage to nerves that control involuntary functions (autonomic nerves). These nerves carry signals that control: ? Heartbeat. ? Body temperature. ? Blood pressure. ? Urination. ? Digestion. ? Sweating. ? Sexual function. ? Response to changing blood sugar (glucose) levels.  Focal neuropathy. This type of nerve damage affects one area of the body, such as an arm, a leg, or the face. The injury may involve one nerve or a small group of nerves. Focal neuropathy can be painful and unpredictable, and occurs most often in older adults with diabetes. This often develops suddenly, but usually improves over time and does not cause long-term problems.  Proximal neuropathy. This  type of nerve damage affects the nerves of the thighs, hips, buttocks, or legs. It causes severe pain, weakness, and muscle death (atrophy), usually in the thigh muscles. It is more common among older men and people who have type 2 diabetes. The length of recovery time may vary. What are the causes? Peripheral, autonomic, and focal neuropathies are caused by diabetes that is not well controlled with treatment. The cause of proximal neuropathy is not known, but it may be caused by inflammation related to uncontrolled blood glucose levels. What are the signs or symptoms? Peripheral neuropathy Peripheral neuropathy develops slowly over time. When the nerves of the feet and legs no longer work, you may experience:  Burning, stabbing, or aching pain in the legs or feet.  Pain or cramping in the legs or feet.  Loss of feeling (numbness) and inability to feel pressure or pain in the feet. This can lead to: ? Thick calluses or sores on areas of constant pressure. ? Ulcers. ? Reduced ability to feel temperature changes.  Foot deformities.  Muscle weakness.  Loss of balance or coordination. Autonomic  neuropathy The symptoms of autonomic neuropathy vary depending on which nerves are affected. Symptoms may include:  Problems with digestion, such as: ? Nausea or vomiting. ? Poor appetite. ? Bloating. ? Diarrhea or constipation. ? Trouble swallowing. ? Losing weight without trying to.  Problems with the heart, blood and lungs, such as: ? Dizziness, especially when standing up. ? Fainting. ? Shortness of breath. ? Irregular heartbeat.  Bladder problems, such as: ? Trouble starting or stopping urination. ? Leaking urine. ? Trouble emptying the bladder. ? Urinary tract infections (UTIs).  Problems with other body functions, such as: ? Sweat. You may sweat too much or too little. ? Temperature. You might get hot easily. Or, you might feel cold more than usual. ? Sexual function. Men may  not be able to get or maintain an erection. Women may have vaginal dryness and difficulty with arousal. Focal neuropathy Symptoms affect only one area of the body. Common symptoms include:  Numbness.  Tingling.  Burning pain.  Prickling feeling.  Very sensitive skin.  Weakness.  Inability to move (paralysis).  Muscle twitching.  Muscles getting smaller (wasting).  Poor coordination.  Double or blurred vision. Proximal neuropathy  Sudden, severe pain in the hip, thigh, or buttocks. Pain may spread from the back into the legs (sciatica).  Pain and numbness in the arms and legs.  Tingling.  Loss of bladder control or bowel control.  Weakness and wasting of thigh muscles.  Difficulty getting up from a seated position.  Abdominal swelling.  Unexplained weight loss. How is this diagnosed? Diagnosis usually involves reviewing your medical history and any symptoms you have. Diagnosis varies depending on the type of neuropathy your health care provider suspects. Peripheral neuropathy Your health care provider will check areas that are affected by your nervous system (neurologic exam), such as your reflexes, how you move, and what you can feel. You may have other tests, such as:  Blood tests.  Removal and examination of fluid that surrounds the spinal cord (lumbar puncture).  CT scan.  MRI.  A test to check the nerves that control muscles (electromyogram, EMG).  Tests of how quickly messages pass through your nerves (nerve conduction velocity tests).  Removal of a small piece of nerve to be examined under a microscope (biopsy). Autonomic neuropathy You may have tests, such as:  Tests to measure your blood pressure and heart rate. This may include monitoring you while you are safely secured to an exam table that moves you from a lying position to an upright position (table tilt test).  Breathing tests to check your lungs.  Tests to check how food moves through  the digestive system (gastric emptying tests).  Blood, sweat, or urine tests.  Ultrasound of your bladder.  Spinal fluid tests. Focal neuropathy This condition may be diagnosed with:  A neurologic exam.  CT scan.  MRI.  EMG.  Nerve conduction velocity tests. Proximal neuropathy There is no test to diagnose this type of neuropathy. You may have tests to rule out other possible causes of this type of neuropathy. Tests may include:  X-rays of your spine and lumbar region.  Lumbar puncture.  MRI. How is this treated? The goal of treatment is to keep nerve damage from getting worse. The most important part of treatment is keeping your blood glucose level and your A1C level within your target range by following your diabetes management plan. Over time, maintaining lower blood glucose levels helps lessen symptoms. In some cases, you may need prescription  pain medicine. Follow these instructions at home:  Lifestyle   Do not use any products that contain nicotine or tobacco, such as cigarettes and e-cigarettes. If you need help quitting, ask your health care provider.  Be physically active every day. Include strength training and balance exercises.  Follow a healthy meal plan.  Work with your health care provider to manage your blood pressure. General instructions  Follow your diabetes management plan as directed. ? Check your blood glucose levels as directed by your health care provider. ? Keep your blood glucose in your target range as directed by your health care provider. ? Have your A1C level checked at least two times a year, or as often as told by your health care provider.  Take over the counter and prescription medicines only as told by your health care provider. This includes insulin and diabetes medicine.  Do not drive or use heavy machinery while taking prescription pain medicines.  Check your skin and feet every day for cuts, bruises, redness, blisters, or  sores.  Keep all follow up visits as told by your health care provider. This is important. Contact a health care provider if:  You have burning, stabbing, or aching pain in your legs or feet.  You are unable to feel pressure or pain in your feet.  You develop problems with digestion, such as: ? Nausea. ? Vomiting. ? Bloating. ? Constipation. ? Diarrhea. ? Abdominal pain.  You have difficulty with urination, such as inability: ? To control when you urinate (incontinence). ? To completely empty the bladder (retention).  You have palpitations.  You feel dizzy, weak, or faint when you stand up. Get help right away if:  You cannot urinate.  You have sudden weakness or loss of coordination.  You have trouble speaking.  You have pain or pressure in your chest.  You have an irregular heart beat.  You have sudden inability to move a part of your body. Summary  Diabetic neuropathy refers to nerve damage that is caused by diabetes. It can affect nerves throughout the entire body, causing numbness and pain in the arms, legs, digestive tract, heart, and other body systems.  Keep your blood glucose level and your blood pressure in your target range, as directed by your health care provider. This can help prevent neuropathy from getting worse.  Check your skin and feet every day for cuts, bruises, redness, blisters, or sores.  Do not use any products that contain nicotine or tobacco, such as cigarettes and e-cigarettes. If you need help quitting, ask your health care provider. This information is not intended to replace advice given to you by your health care provider. Make sure you discuss any questions you have with your health care provider. Document Revised: 08/25/2017 Document Reviewed: 08/17/2016 Elsevier Patient Education  [place].  Diabetes Mellitus and [place] care is an important part of your health, especially when you have diabetes. Diabetes may cause  you to have problems because of poor blood flow (circulation) to your feet and legs, which can cause your skin to:  Become thinner and drier.  Break more easily.  Heal more slowly.  Peel and crack. You may also have nerve damage (neuropathy) in your legs and feet, causing decreased feeling in them. This means that you may not notice minor injuries to your feet that could lead to more serious problems. Noticing and addressing any potential problems early is the best way to prevent future foot problems. How to care  for your feet Foot hygiene  Wash your feet daily with warm water and mild soap. Do not use hot water. Then, pat your feet and the areas between your toes until they are completely dry. Do not soak your feet as this can dry your skin.  Trim your toenails straight across. Do not dig under them or around the cuticle. File the edges of your nails with an emery board or nail file.  Apply a moisturizing lotion or petroleum jelly to the skin on your feet and to dry, brittle toenails. Use lotion that does not contain alcohol and is unscented. Do not apply lotion between your toes. Shoes and socks  Wear clean socks or stockings every day. Make sure they are not too tight. Do not wear knee-high stockings since they may decrease blood flow to your legs.  Wear shoes that fit properly and have enough cushioning. Always look in your shoes before you put them on to be sure there are no objects inside.  To break in new shoes, wear them for just a few hours a day. This prevents injuries on your feet. Wounds, scrapes, corns, and calluses  Check your feet daily for blisters, cuts, bruises, sores, and redness. If you cannot see the bottom of your feet, use a mirror or ask someone for help.  Do not cut corns or calluses or try to remove them with medicine.  If you find a minor scrape, cut, or break in the skin on your feet, keep it and the skin around it clean and dry. You may clean these areas  with mild soap and water. Do not clean the area with peroxide, alcohol, or iodine.  If you have a wound, scrape, corn, or callus on your foot, look at it several times a day to make sure it is healing and not infected. Check for: ? Redness, swelling, or pain. ? Fluid or blood. ? Warmth. ? Pus or a bad smell. General instructions  Do not cross your legs. This may decrease blood flow to your feet.  Do not use heating pads or hot water bottles on your feet. They may burn your skin. If you have lost feeling in your feet or legs, you may not know this is happening until it is too late.  Protect your feet from hot and cold by wearing shoes, such as at the beach or on hot pavement.  Schedule a complete foot exam at least once a year (annually) or more often if you have foot problems. If you have foot problems, report any cuts, sores, or bruises to your health care provider immediately. Contact a health care provider if:  You have a medical condition that increases your risk of infection and you have any cuts, sores, or bruises on your feet.  You have an injury that is not healing.  You have redness on your legs or feet.  You feel burning or tingling in your legs or feet.  You have pain or cramps in your legs and feet.  Your legs or feet are numb.  Your feet always feel cold.  You have pain around a toenail. Get help right away if:  You have a wound, scrape, corn, or callus on your foot and: ? You have pain, swelling, or redness that gets worse. ? You have fluid or blood coming from the wound, scrape, corn, or callus. ? Your wound, scrape, corn, or callus feels warm to the touch. ? You have pus or a bad smell coming  from the wound, scrape, corn, or callus. ? You have a fever. ? You have a red line going up your leg. Summary  Check your feet every day for cuts, sores, red spots, swelling, and blisters.  Moisturize feet and legs daily.  Wear shoes that fit properly and have  enough cushioning.  If you have foot problems, report any cuts, sores, or bruises to your health care provider immediately.  Schedule a complete foot exam at least once a year (annually) or more often if you have foot problems. This information is not intended to replace advice given to you by your health care provider. Make sure you discuss any questions you have with your health care provider. Document Revised: 04/05/2019 Document Reviewed: 08/14/2016 Elsevier Patient Education  [place].

## 2020-01-28 NOTE — Progress Notes (Signed)
Subjective: [name] presents today referred by Redmond School, MD for diabetic foot evaluation.  Patient relates 8 year history of diabetes.  Patient denies any history of foot wounds.  Patient admits to symptoms of numbness in her feet which is managed with gabapentin.  Today, patient c/o of painful, discolored, thick toenails which interfere with daily activities.  Pain is aggravated when wearing enclosed shoe gear.   Past Medical History:  Diagnosis Date   Anemia    Anxiety    Asthma    Bipolar 1 disorder ([place])    Chronic abdominal pain    COPD (chronic obstructive pulmonary disease) ([place])    Depression    Diabetes mellitus    Diabetic neuropathy ([place]) 10/29/2017   Esophagitis    Hiatal hernia    Hyperlipidemia 02/03/2012   Migraine    Neck pain 06/08/2012   Pancreatitis chronic Idiopathic   Treated by Dr. Newman Pies at [facility] in the past with celiac blocks.    Patient Active Problem List   Diagnosis Date Noted   Arteriovenous malformation (AVM)    Symptomatic anemia    Vitamin B12 deficiency 05/25/2019   Melena    History of pancreatitis 05/06/2019   Sepsis ([place]) 05/06/2019   Diabetic neuropathy ([place]) 10/29/2017   DM type 2 (diabetes mellitus, type 2) ([place]) 03/28/2014   COPD exacerbation ([place]) 03/27/2014   CAP (community acquired pneumonia) 06/29/2013   Acute bronchitis 06/29/2013   Anxiety 02/27/2013   Abdominal cramping 02/27/2013   BRBPR (bright red blood per rectum) 12/02/2012   Constipation/Stool Impaction 12/02/2012   Acute blood loss anemia 12/02/2012   Pancreatitis 11/24/2012   Hyperglycemia, drug-induced 06/08/2012   Arm paresthesia, left 06/07/2012   Neck pain on left side 06/07/2012   Asthma exacerbation 04/05/2012   Acute otitis media 02/04/2012   Hyperlipidemia 02/03/2012   Acute respiratory failure with hypoxia ([place]) 09/23/2011   Tobacco abuse 09/23/2011   Constipation 08/29/2011    Hypokalemia 07/15/2011   Muscle spasm 04/24/2011   Otitis externa 04/24/2011   Idiopathic acute pancreatitis 04/24/2011   ANEMIA, IRON DEFICIENCY 01/04/2009   ANXIETY 01/04/2009   Bipolar disorder ([place]) 01/04/2009   MIGRAINE HEADACHE 01/04/2009   Essential hypertension 01/04/2009   ASTHMA 01/04/2009   ESOPHAGEAL STRICTURE 01/04/2009   GERD 01/04/2009   HIATAL HERNIA 01/04/2009   HEMATOCHEZIA 01/04/2009   Diarrhea 01/04/2009   ABDOMINAL PAIN 01/04/2009    Past Surgical History:  Procedure Laterality Date   ABDOMINAL HYSTERECTOMY     attempted colonoscopy  02/2017   Dr. Newman Pies: Poor prep, scope passed to mid transverse colon before colonoscopy aborted.  No significant findings noted to mid transverse colon.  Next colonoscopy in 2 years.   CELIAC PLEXUS BLOCK     CHOLECYSTECTOMY     COLONOSCOPY N/A 12/05/2012   KGM:WNUUVO rectum, colon and terminal ileum   ESOPHAGOGASTRODUODENOSCOPY  02/20/2008   ZDG:[id number] distal esophageal mucosa, suspicious for neoplasm/Hiatal hernia otherwise normal    ESOPHAGOGASTRODUODENOSCOPY  04/03/2008   VZD:[id number] hiatal hernia, otherwise normal/Short, tight, benign-appearing peptic stricture   ESOPHAGOGASTRODUODENOSCOPY  November 16, 2012   Dr. Britta Mccreedy: hiatal hernia, esophagitis,    ESOPHAGOGASTRODUODENOSCOPY (EGD) WITH PROPOFOL N/A 05/09/2019   Dr. Oneida Alar: Large hiatal hernia with erythema and edema in the pouch, mild gastritis.  No biopsies taken.   ESOPHAGOGASTRODUODENOSCOPY (EGD) WITH PROPOFOL N/A 05/26/2019   Procedure: ESOPHAGOGASTRODUODENOSCOPY (EGD) WITH PROPOFOL;  Surgeon: Danie Binder, MD;  Location: [facility];  Service: Endoscopy;  Laterality: N/A;   EUS  12/2018   Dr. Newman Pies: LA grade B esophagitis, antritis but negative H. pylori, 1 cm ulcer at the duodenal sweep   GIVENS CAPSULE STUDY N/A 12/05/2012   Few superficial erosions but nothing found to explain iron deficiency anemia.    GIVENS CAPSULE  STUDY N/A 05/26/2019   Procedure: GIVENS CAPSULE STUDY;  Surgeon: Danie Binder, MD;  Location: [facility];  Service: Endoscopy;  Laterality: N/A;   Ileocolonoscopy  06/05/2008     VVZ:SMOLMBE anal canal, otherwise normal rectum, colon   TONSILLECTOMY      Current Outpatient Medications on File Prior to Visit  Medication Sig Dispense Refill   albuterol (PROVENTIL HFA;VENTOLIN HFA) 108 (90 BASE) MCG/ACT inhaler Inhale 2 puffs into the lungs daily as needed for wheezing. For shortness of breath     albuterol (PROVENTIL) (2.5 MG/3ML) 0.083% nebulizer solution Take 2.5 mg by nebulization every 6 (six) hours as needed for wheezing or shortness of breath.      amitriptyline (ELAVIL) 100 MG tablet Take 100 mg by mouth at bedtime.     atorvastatin (LIPITOR) 20 MG tablet Take 20 mg by mouth daily.     azithromycin (ZITHROMAX) 250 MG tablet Take 1 tablet (250 mg total) by mouth daily. Take first 2 tablets together, then 1 every day until finished. 6 tablet 0   bisacodyl (DULCOLAX) 5 MG EC tablet Take 1 tablet (5 mg total) by mouth 2 (two) times daily. 14 tablet 0   budesonide-formoterol (SYMBICORT) 80-4.5 MCG/ACT inhaler Inhale 2 puffs into the lungs 2 (two) times daily. 1 Inhaler 0   busPIRone (BUSPAR) 10 MG tablet Take 10 mg by mouth 3 (three) times daily.     citalopram (CELEXA) 40 MG tablet Take 40 mg by mouth daily.     docusate sodium (COLACE) 100 MG capsule Take 100 mg by mouth at bedtime.      ferrous sulfate 325 (65 FE) MG tablet Take 325 mg by mouth daily.     gabapentin (NEURONTIN) 300 MG capsule Take 300 mg by mouth 3 (three) times daily.      levothyroxine (SYNTHROID, LEVOTHROID) 25 MCG tablet Take 25 mcg by mouth daily before breakfast.     lisinopril (PRINIVIL,ZESTRIL) 2.5 MG tablet Take 2.5 mg by mouth daily.      metFORMIN (GLUCOPHAGE) 500 MG tablet Take 2 tablets by mouth 2 (two) times daily.     methocarbamol (ROBAXIN) 500 MG tablet Take 500 mg by mouth 3  (three) times daily.     mupirocin cream (BACTROBAN) 2 % Apply 1 application topically 2 (two) times daily. 15 g 0   oxyCODONE (ROXICODONE) 15 MG immediate release tablet Take 15 mg by mouth 4 (four) times daily.      OXYGEN Inhale 3 L into the lungs daily as needed (for shortness of breath).     pantoprazole (PROTONIX) 40 MG tablet Take 40 mg by mouth 2 (two) times daily.     polyethylene glycol powder (GLYCOLAX/MIRALAX) powder Take 17 g by mouth 2 (two) times daily. Until daily soft stools  OTC 250 g 0   promethazine (PHENERGAN) 25 MG tablet Take 25 mg by mouth every 6 (six) hours as needed for nausea or vomiting.     SPIRIVA HANDIHALER 18 MCG inhalation capsule Place 18 mcg into inhaler and inhale daily.      valACYclovir (VALTREX) 500 MG tablet Take 500 mg by mouth daily.     linaclotide (LINZESS) 290 MCG CAPS capsule Take 1 capsule (290 mcg  total) by mouth daily before breakfast. 30 capsule 0   No current facility-administered medications on file prior to visit.     Allergies  Allergen Reactions   Penicillins Shortness Of Breath     Has patient had a PCN reaction causing immediate rash, facial/tongue/throat swelling, SOB or lightheadedness with hypotension: Yes Has patient had a PCN reaction causing severe rash involving mucus membranes or skin necrosis: Yes Has patient had a PCN reaction that required hospitalization No Has patient had a PCN reaction occurring within the last 10 years: No  If all of the above answers are "NO", then may proceed with Cephalosporin use. Tolerated ceftriaxone   Sulfa Antibiotics Shortness Of Breath   Aspirin Nausea And Vomiting   Lovenox  [Enoxaparin Sodium] Other (See Comments)    Has been known to cause her blood clots in her legs   Prednisone Other (See Comments)    Pancreatitis, but has to take for asthma sometimes    Social History   Occupational History   Occupation: disabled  Tobacco Use   Smoking status: Former Smoker     Smokeless tobacco: Never Used  Scientific laboratory technician Use: Never used  Substance and Sexual Activity   Alcohol use: No   Drug use: No   Sexual activity: Yes    Birth control/protection: Surgical    Family History  Problem Relation Age of Onset   Asthma Other    Asthma Paternal Grandfather    Heart attack Paternal Grandfather    Hypertension Mother    Cancer Maternal Grandmother    Diabetes Paternal Grandmother    Coronary artery disease Neg Hx    Colon cancer Neg Hx     Immunization History  Administered Date(s) Administered   Influenza-Unspecified 03/27/2014   Pneumococcal Polysaccharide-23 04/26/2008    Review of systems: Positive Findings in bold print.  Constitutional:  chills, fatigue, fever, sweats, weight change Communication: Optometrist, sign Ecologist, hand writing, iPad/Android device Head: headaches, head injury Eyes: changes in vision, eye pain, glaucoma, cataracts, macular degeneration, diplopia, glare,  light sensitivity, eyeglasses or contacts, blindness Ears nose mouth throat: hearing impaired, hearing aids,  ringing in ears, deaf, sign language,  vertigo, nosebleeds,  rhinitis,  cold sores, snoring, swollen glands Cardiovascular: HTN, edema, arrhythmia, pacemaker in place, defibrillator in place, chest pain/tightness, chronic anticoagulation, blood clot, heart failure, MI Peripheral Vascular: leg cramps, varicose veins, blood clots, lymphedema, varicosities Respiratory:  difficulty breathing, denies congestion, SOB, wheezing, cough, emphysema, asthma Gastrointestinal: change in appetite or weight, abdominal pain, constipation, diarrhea, nausea, vomiting, vomiting blood, change in bowel habits, abdominal pain, jaundice, rectal bleeding, hemorrhoids, GERD Genitourinary:  nocturia,  pain on urination, polyuria,  blood in urine, Foley catheter, urinary urgency, ESRD on hemodialysis Musculoskeletal: amputation, cramping, stiff joints,  painful joints, decreased joint motion, fractures, OA, gout, hemiplegia, paraplegia, uses cane, wheelchair bound, uses walker, uses rollator Skin: +changes in toenails, color change, dryness, itching, mole changes,  rash, wound(s) Neurological: headaches, numbness in feet, paresthesias in feet, burning in feet, fainting,  seizures, change in speech,  headaches, memory problems/poor historian, cerebral palsy, weakness, paralysis, CVA, TIA Endocrine: diabetes, hypothyroidism, hyperthyroidism,  goiter, dry mouth, flushing, heat intolerance,  cold intolerance,  excessive thirst, denies polyuria,  nocturia Hematological:  easy bleeding, excessive bleeding, easy bruising, enlarged lymph nodes, on long term blood thinner, history of past transusions Allergy/immunological:  hives, eczema, frequent infections, multiple drug allergies, seasonal allergies, transplant recipient, multiple food allergies Psychiatric:  anxiety, depression, mood disorder, suicidal ideations,  hallucinations, insomnia  Objective: There were no vitals filed for this visit.  Simrah P Shatzer is a/an 49 y.o. female WD, WN in NAD.Marland Kitchen AAO X 3.  Vascular Examination: Capillary fill time to digits <3 seconds b/l lower extremities. Palpable DP pulse(s) b/l lower extremities Faintly palpable PT pulse(s) b/l lower extremities. Pedal hair sparse. Lower extremity skin temperature gradient within normal limits. No pain with calf compression b/l. No edema noted b/l lower extremities.  Dermatological Examination:             Pedal skin with normal turgor, texture and tone bilaterally. No open wounds bilaterally. No interdigital macerations bilaterally. She has two transverse cuts noted on plantar forefoot area of left foot suspicious for foreign body object. Small piece of glass excised from left forefoot lesion.   2.0 x 2.0 partial thickness ulceration submet head 1 with cracked center and hyperkeratotic rim. No erythema, no edema, no  drainage, no flocculence.  Musculoskeletal Examination: Normal muscle strength 5/5 to all lower extremity muscle groups bilaterally. Hammertoes noted to the L 4th toe, L 5th toe, R 4th toe and R 5th toe.  Footwear Assessment: Does the patient wear appropriate shoes? No. Does the patient need inserts/orthotics? Yes.  Neurological Examination: Protective sensation diminished with 10g monofilament b/l. Vibratory sensation intact b/l. Proprioception intact bilaterally. Deep tendon reflexes normal b/l.  Babinski reflex negative b/l. Clonus negative b/l.   Xray left foot signficant for foreign body object noted plantar forefoot consistent with area of cut in skin.  Hemoglobin A1C Latest Ref Rng & Units 05/24/2019  HGBA1C 4.8 - 5.6 % 6.3(H)  Some recent data might be hidden   Assessment: 1. Foreign body in left foot, initial encounter   2. Pre-ulcerative calluses   3. Diabetic peripheral neuropathy associated with type 2 diabetes mellitus ([place])   4. Encounter for diabetic foot exam ([place])     ADA Risk Categorization: High Risk:  Patient has one or more of the following: Loss of protective sensation Absent pedal pulses Severe Foot deformity History of foot ulcer  Plan: -Examined patient. -Diabetic foot examination performed on today's visit. -Discussed diabetic foot care principles. Literature dispensed on today. -Betadine prep applied to left forefoot laceration. Glass removed from left foot. Area irrigated with alcohol. Triple antibiotic ointment and band-aid applied. -Partial thickness ulcer submet head 1 right foot pared utilizing sterile scalpel blade without complication or incident. Light dressing applied. -Xray of left foot was performed and reviewed with patient and/or POA. -Prescription written for Mupirocin Ointment. Patient is to apply to right foot preulcerative lesion and left forefoot area once daily and cover with dressing. -Rx sent to her pharmacy for Doxycycline 100  mg tabs. Take 1 tablet (100 mg total) by mouth 2 (two) times daily for 10 days. -Counseled patient on the dangers of walking barefoot. She states she will comply.   Return in about 3 weeks (around 02/16/2020) for Pre-ulcerative callous right foot, foreign body left foot.

## 2020-02-06 DIAGNOSIS — J441 Chronic obstructive pulmonary disease with (acute) exacerbation: Secondary | ICD-10-CM | POA: Diagnosis not present

## 2020-02-19 DIAGNOSIS — G8929 Other chronic pain: Secondary | ICD-10-CM | POA: Diagnosis not present

## 2020-02-19 DIAGNOSIS — F17210 Nicotine dependence, cigarettes, uncomplicated: Secondary | ICD-10-CM | POA: Diagnosis not present

## 2020-02-19 DIAGNOSIS — Z79899 Other long term (current) drug therapy: Secondary | ICD-10-CM | POA: Diagnosis not present

## 2020-02-19 DIAGNOSIS — Z20822 Contact with and (suspected) exposure to covid-19: Secondary | ICD-10-CM | POA: Diagnosis not present

## 2020-02-19 DIAGNOSIS — K2100 Gastro-esophageal reflux disease with esophagitis, without bleeding: Secondary | ICD-10-CM | POA: Diagnosis not present

## 2020-02-19 DIAGNOSIS — E119 Type 2 diabetes mellitus without complications: Secondary | ICD-10-CM | POA: Diagnosis not present

## 2020-02-19 DIAGNOSIS — Z7951 Long term (current) use of inhaled steroids: Secondary | ICD-10-CM | POA: Diagnosis not present

## 2020-02-19 DIAGNOSIS — Z7984 Long term (current) use of oral hypoglycemic drugs: Secondary | ICD-10-CM | POA: Diagnosis not present

## 2020-02-19 DIAGNOSIS — K861 Other chronic pancreatitis: Secondary | ICD-10-CM | POA: Diagnosis not present

## 2020-02-19 DIAGNOSIS — J449 Chronic obstructive pulmonary disease, unspecified: Secondary | ICD-10-CM | POA: Diagnosis not present

## 2020-02-21 ENCOUNTER — Ambulatory Visit: Payer: Medicare HMO | Admitting: Podiatry

## 2020-02-22 DIAGNOSIS — G894 Chronic pain syndrome: Secondary | ICD-10-CM | POA: Diagnosis not present

## 2020-02-22 DIAGNOSIS — M1991 Primary osteoarthritis, unspecified site: Secondary | ICD-10-CM | POA: Diagnosis not present

## 2020-02-22 DIAGNOSIS — Z6823 Body mass index (BMI) 23.0-23.9, adult: Secondary | ICD-10-CM | POA: Diagnosis not present

## 2020-03-13 ENCOUNTER — Other Ambulatory Visit: Payer: Self-pay

## 2020-03-13 ENCOUNTER — Ambulatory Visit (INDEPENDENT_AMBULATORY_CARE_PROVIDER_SITE_OTHER): Payer: Medicare HMO | Admitting: Podiatry

## 2020-03-13 ENCOUNTER — Encounter: Payer: Self-pay | Admitting: Podiatry

## 2020-03-13 DIAGNOSIS — S90852D Superficial foreign body, left foot, subsequent encounter: Secondary | ICD-10-CM | POA: Diagnosis not present

## 2020-03-13 DIAGNOSIS — E1142 Type 2 diabetes mellitus with diabetic polyneuropathy: Secondary | ICD-10-CM

## 2020-03-13 DIAGNOSIS — S90414A Abrasion, right lesser toe(s), initial encounter: Secondary | ICD-10-CM | POA: Diagnosis not present

## 2020-03-13 MED ORDER — MUPIROCIN CALCIUM 2 % EX CREA: 1.0000 "application " | TOPICAL_CREAM | Freq: Every day | CUTANEOUS | 1 refills | Status: DC

## 2020-03-16 NOTE — Progress Notes (Signed)
Subjective:  Patient ID: [name], female    DOB: [birth year],  MRN: [id number]  49 y.o. female presents with follow up foreign body left foot and at risk foot care with history of diabetic neuropathy.  States she missed her last appointment.  Review of Systems: Negative except as noted in the HPI.  Past Medical History:  Diagnosis Date  . Anemia   . Anxiety   . Asthma   . Bipolar 1 disorder ([place])   . Chronic abdominal pain   . COPD (chronic obstructive pulmonary disease) ([place])   . Depression   . Diabetes mellitus   . Diabetic neuropathy ([place]) 10/29/2017  . Esophagitis   . Hiatal hernia   . Hyperlipidemia 02/03/2012  . Migraine   . Neck pain 06/08/2012  . Pancreatitis chronic Idiopathic   Treated by Dr. Newman Pies at [facility] in the past with celiac blocks.   Past Surgical History:  Procedure Laterality Date  . ABDOMINAL HYSTERECTOMY    . attempted colonoscopy  02/2017   Dr. Newman Pies: Poor prep, scope passed to mid transverse colon before colonoscopy aborted.  No significant findings noted to mid transverse colon.  Next colonoscopy in 2 years.  . CELIAC PLEXUS BLOCK    . CHOLECYSTECTOMY    . COLONOSCOPY N/A 12/05/2012   KGY:JEHUDJ rectum, colon and terminal ileum  . ESOPHAGOGASTRODUODENOSCOPY  02/20/2008   SHF:WYOVZCHYI distal esophageal mucosa, suspicious for neoplasm/Hiatal hernia otherwise normal   . ESOPHAGOGASTRODUODENOSCOPY  04/03/2008   FOY:DXAJOINO-MVEHM hiatal hernia, otherwise normal/Short, tight, benign-appearing peptic stricture  . ESOPHAGOGASTRODUODENOSCOPY  November 16, 2012   Dr. Britta Mccreedy: hiatal hernia, esophagitis,   . ESOPHAGOGASTRODUODENOSCOPY (EGD) WITH PROPOFOL N/A 05/09/2019   Dr. Oneida Alar: Large hiatal hernia with erythema and edema in the pouch, mild gastritis.  No biopsies taken.  . ESOPHAGOGASTRODUODENOSCOPY (EGD) WITH PROPOFOL N/A 05/26/2019   Procedure: ESOPHAGOGASTRODUODENOSCOPY (EGD) WITH PROPOFOL;  Surgeon: Danie Binder, MD;  Location: [facility]  ENDO SUITE;  Service: Endoscopy;  Laterality: N/A;  . EUS  12/2018   Dr. Newman Pies: LA grade B esophagitis, antritis but negative H. pylori, 1 cm ulcer at the duodenal sweep  . GIVENS CAPSULE STUDY N/A 12/05/2012   Few superficial erosions but nothing found to explain iron deficiency anemia.   Marland Kitchen GIVENS CAPSULE STUDY N/A 05/26/2019   Procedure: GIVENS CAPSULE STUDY;  Surgeon: Danie Binder, MD;  Location: [facility] ENDO SUITE;  Service: Endoscopy;  Laterality: N/A;  . Ileocolonoscopy  06/05/2008     CNO:[id number] anal canal, otherwise normal rectum, colon  . TONSILLECTOMY     Patient Active Problem List   Diagnosis Date Noted  . Arteriovenous malformation (AVM)   . Symptomatic anemia   . Vitamin B12 deficiency 05/25/2019  . Melena   . History of pancreatitis 05/06/2019  . Sepsis ([place]) 05/06/2019  . Diabetic neuropathy ([place]) 10/29/2017  . DM type 2 (diabetes mellitus, type 2) ([place]) 03/28/2014  . COPD exacerbation ([place]) 03/27/2014  . CAP (community acquired pneumonia) 06/29/2013  . Acute bronchitis 06/29/2013  . Anxiety 02/27/2013  . Abdominal cramping 02/27/2013  . BRBPR (bright red blood per rectum) 12/02/2012  . Constipation/Stool Impaction 12/02/2012  . Acute blood loss anemia 12/02/2012  . Pancreatitis 11/24/2012  . Hyperglycemia, drug-induced 06/08/2012  . Arm paresthesia, left 06/07/2012  . Neck pain on left side 06/07/2012  . Asthma exacerbation 04/05/2012  . Acute otitis media 02/04/2012  . Hyperlipidemia 02/03/2012  . Acute respiratory failure with hypoxia ([place]) 09/23/2011  . Tobacco abuse 09/23/2011  . Constipation  08/29/2011  . Hypokalemia 07/15/2011  . Muscle spasm 04/24/2011  . Otitis externa 04/24/2011  . Idiopathic acute pancreatitis 04/24/2011  . ANEMIA, IRON DEFICIENCY 01/04/2009  . ANXIETY 01/04/2009  . Bipolar disorder ([place]) 01/04/2009  . MIGRAINE HEADACHE 01/04/2009  . Essential hypertension 01/04/2009  . ASTHMA 01/04/2009  . ESOPHAGEAL STRICTURE 01/04/2009  .  GERD 01/04/2009  . HIATAL HERNIA 01/04/2009  . HEMATOCHEZIA 01/04/2009  . Diarrhea 01/04/2009  . ABDOMINAL PAIN 01/04/2009    Current Outpatient Medications:  .  acetaminophen (TYLENOL) 500 MG tablet, Take by mouth., Disp: , Rfl:  .  hydrOXYzine (ATARAX/VISTARIL) 25 MG tablet, Take by mouth., Disp: , Rfl:  .  lipase/protease/amylase (CREON) 36000 UNITS CPEP capsule, Take by mouth., Disp: , Rfl:  .  magnesium oxide (MAG-OX) 400 MG tablet, Take by mouth., Disp: , Rfl:  .  mupirocin cream (BACTROBAN) 2 %, Apply 1 application topically daily. Apply to right 2nd toe and left great toe once daily until healed., Disp: 30 g, Rfl: 1 .  ondansetron (ZOFRAN) 4 MG tablet, Take by mouth., Disp: , Rfl:  .  Pancrelipase, Lip-Prot-Amyl, (ZENPEP) 20000-63000 units CPEP, Take 1 capsule by mouth three times daily with meals and 1 capsules with snacks., Disp: , Rfl:  .  traMADol (ULTRAM) 50 MG tablet, , Disp: , Rfl:  .  albuterol (PROVENTIL HFA;VENTOLIN HFA) 108 (90 BASE) MCG/ACT inhaler, Inhale 2 puffs into the lungs daily as needed for wheezing. For shortness of breath, Disp: , Rfl:  .  albuterol (PROVENTIL) (2.5 MG/3ML) 0.083% nebulizer solution, Take 2.5 mg by nebulization every 6 (six) hours as needed for wheezing or shortness of breath. , Disp: , Rfl:  .  amitriptyline (ELAVIL) 100 MG tablet, Take 100 mg by mouth at bedtime., Disp: , Rfl:  .  amLODipine (NORVASC) 10 MG tablet, Take 10 mg by mouth daily., Disp: , Rfl:  .  atorvastatin (LIPITOR) 20 MG tablet, Take 20 mg by mouth daily., Disp: , Rfl:  .  azithromycin (ZITHROMAX) 250 MG tablet, Take 1 tablet (250 mg total) by mouth daily. Take first 2 tablets together, then 1 every day until finished., Disp: 6 tablet, Rfl: 0 .  bisacodyl (DULCOLAX) 5 MG EC tablet, Take 1 tablet (5 mg total) by mouth 2 (two) times daily., Disp: 14 tablet, Rfl: 0 .  budesonide-formoterol (SYMBICORT) 80-4.5 MCG/ACT inhaler, Inhale 2 puffs into the lungs 2 (two) times daily., Disp:  1 Inhaler, Rfl: 0 .  busPIRone (BUSPAR) 10 MG tablet, Take 10 mg by mouth 3 (three) times daily., Disp: , Rfl:  .  citalopram (CELEXA) 40 MG tablet, Take 40 mg by mouth daily., Disp: , Rfl:  .  clonazePAM (KLONOPIN) 0.5 MG tablet, Take by mouth., Disp: , Rfl:  .  docusate sodium (COLACE) 100 MG capsule, Take 100 mg by mouth at bedtime. , Disp: , Rfl:  .  ferrous sulfate 325 (65 FE) MG tablet, Take 325 mg by mouth daily., Disp: , Rfl:  .  furosemide (LASIX) 20 MG tablet, , Disp: , Rfl:  .  gabapentin (NEURONTIN) 300 MG capsule, Take 300 mg by mouth 3 (three) times daily. , Disp: , Rfl:  .  ipratropium-albuterol (DUONEB) 0.5-2.5 (3) MG/3ML SOLN, Inhale into the lungs., Disp: , Rfl:  .  levothyroxine (SYNTHROID, LEVOTHROID) 25 MCG tablet, Take 25 mcg by mouth daily before breakfast., Disp: , Rfl:  .  linaclotide (LINZESS) 290 MCG CAPS capsule, Take 1 capsule (290 mcg total) by mouth daily before breakfast., Disp: 30  capsule, Rfl: 0 .  lisinopril (PRINIVIL,ZESTRIL) 2.5 MG tablet, Take 2.5 mg by mouth daily. , Disp: , Rfl:  .  LORazepam (ATIVAN) 1 MG tablet, Take by mouth., Disp: , Rfl:  .  metFORMIN (GLUCOPHAGE) 500 MG tablet, Take 2 tablets by mouth 2 (two) times daily., Disp: , Rfl:  .  methocarbamol (ROBAXIN) 500 MG tablet, Take 500 mg by mouth 3 (three) times daily., Disp: , Rfl:  .  oxyCODONE (ROXICODONE) 15 MG immediate release tablet, Take 15 mg by mouth 4 (four) times daily. , Disp: , Rfl:  .  OXYGEN, Inhale 3 L into the lungs daily as needed (for shortness of breath)., Disp: , Rfl:  .  pantoprazole (PROTONIX) 40 MG tablet, Take 40 mg by mouth 2 (two) times daily., Disp: , Rfl:  .  polyethylene glycol powder (GLYCOLAX/MIRALAX) powder, Take 17 g by mouth 2 (two) times daily. Until daily soft stools  OTC, Disp: 250 g, Rfl: 0 .  promethazine (PHENERGAN) 25 MG tablet, Take 25 mg by mouth every 6 (six) hours as needed for nausea or vomiting., Disp: , Rfl:  .  SHINGRIX injection, , Disp: , Rfl:  .   SPIRIVA HANDIHALER 18 MCG inhalation capsule, Place 18 mcg into inhaler and inhale daily. , Disp: , Rfl:  .  valACYclovir (VALTREX) 500 MG tablet, Take 500 mg by mouth daily., Disp: , Rfl:  .  ZENPEP 20000-63000 units CPEP, , Disp: , Rfl:  Allergies  Allergen Reactions  . Penicillins Shortness Of Breath     Has patient had a PCN reaction causing immediate rash, facial/tongue/throat swelling, SOB or lightheadedness with hypotension: Yes Has patient had a PCN reaction causing severe rash involving mucus membranes or skin necrosis: Yes Has patient had a PCN reaction that required hospitalization No Has patient had a PCN reaction occurring within the last 10 years: No  If all of the above answers are "NO", then may proceed with Cephalosporin use. Tolerated ceftriaxone  . Sulfa Antibiotics Shortness Of Breath  . Aspirin Nausea And Vomiting  . Enoxaparin Other (See Comments)    Has been known to cause her blood clots in her legs Has been known to cause her blood clots in her legs  . Lovenox  [Enoxaparin Sodium] Other (See Comments)    Has been known to cause her blood clots in her legs  . Prednisone Other (See Comments)    Pancreatitis, but has to take for asthma sometimes   Social History   Tobacco Use  Smoking Status Former Smoker  Smokeless Tobacco Never Used   Objective:  There were no vitals filed for this visit. Constitutional Patient is a pleasant 49 y.o. Caucasian female WD, WN in NAD. AAO x 3.  Vascular Capillary fill time to digits <3 seconds b/l lower extremities. Palpable DP pulse(s) b/l lower extremities Faintly palpable PT pulse(s) b/l lower extremities. Pedal hair sparse. Lower extremity skin temperature gradient within normal limits. No pain with calf compression b/l. No edema noted b/l lower extremities. No cyanosis or clubbing noted.  Neurologic Normal speech. Oriented to person, place, and time. Protective sensation diminished with 10g monofilament b/l. Vibratory  sensation intact b/l. Proprioception intact bilaterally. Deep tendon reflexes normal b/l.   Dermatologic Pedal skin with normal turgor, texture and tone bilaterally. No open wounds bilaterally. No interdigital macerations bilaterally. Abrasion dorsal aspect right 2nd digit. No signs of infection. Left foot healed. Area of previous foreign body site is healed.  No active drainage, no erythema, no edema.  Orthopedic: Normal muscle strength 5/5 to all lower extremity muscle groups bilaterally. No pain crepitus or joint limitation noted with ROM b/l. Hammertoes noted to the L 4th toe, L 5th toe, R 4th toe and R 5th toe.   Hemoglobin A1C Latest Ref Rng & Units 05/24/2019  HGBA1C 4.8 - 5.6 % 6.3(H)  Some recent data might be hidden   Assessment:   1. Foreign body in left foot, subsequent encounter   2. Abrasion of lesser toe of right foot, initial encounter   3. Diabetic peripheral neuropathy associated with type 2 diabetes mellitus ([place])    Plan:  Patient was evaluated and treated and all questions answered. -Educated on self-care -Continue diabetic foot care principles. -Examined patient. -Patient to report any pedal injuries to medical professional immediately. -Reordered Mupirocin Ointment for once daily application to right 2nd digit until healed. -All wounds healed. -Patient to continue soft, supportive shoe gear daily. -Patient/POA to call should there be question/concern in the interim.  Return in about 6 weeks (around 04/24/2020) for diabetic callus trim.  Marzetta Board, DPM

## 2020-03-19 DIAGNOSIS — E119 Type 2 diabetes mellitus without complications: Secondary | ICD-10-CM | POA: Diagnosis not present

## 2020-03-19 DIAGNOSIS — N342 Other urethritis: Secondary | ICD-10-CM | POA: Diagnosis not present

## 2020-03-19 DIAGNOSIS — Z6822 Body mass index (BMI) 22.0-22.9, adult: Secondary | ICD-10-CM | POA: Diagnosis not present

## 2020-03-19 DIAGNOSIS — G894 Chronic pain syndrome: Secondary | ICD-10-CM | POA: Diagnosis not present

## 2020-05-27 ENCOUNTER — Ambulatory Visit: Payer: Medicare HMO | Admitting: Podiatry

## 2020-12-10 IMAGING — DX DG CHEST 1V PORT
1 series · 1 of 1 positions shown · non-contrast
Comparison: Radiograph 05/06/2019

CLINICAL DATA: Shortness of breath, pneumonia since [REDACTED], recent
hospitalization

EXAM:
PORTABLE CHEST 1 VIEW

[chest ap]
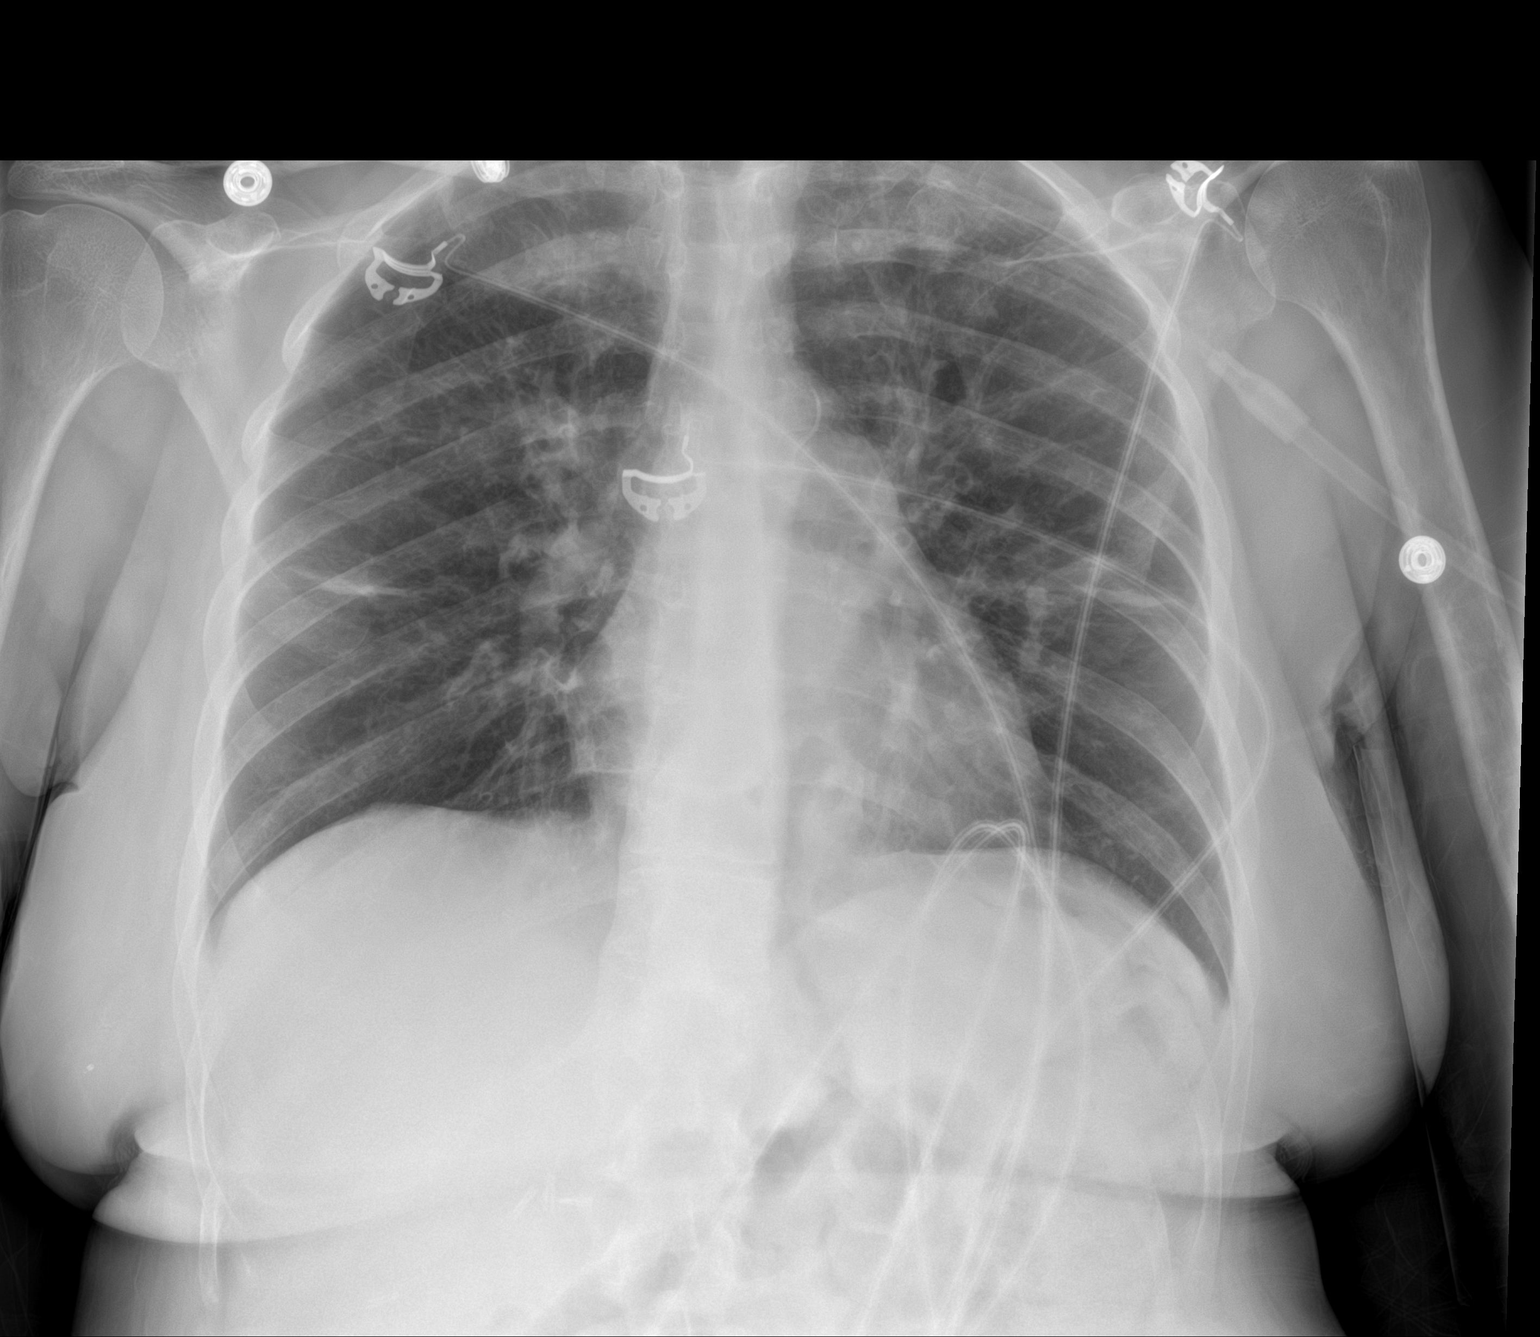

[1 of 1 positions shown; findings below may reference images not displayed]

FINDINGS: Bandlike areas of opacities in the mid lungs may reflect
subsegmental atelectasis. There are chronic bronchitic changes
throughout the lungs with diffuse airways thickening. Some patchy
suprahilar and apical opacities are present. No pneumothorax or
effusion. Cardiomediastinal contours are stable from prior including
a calcified aorta. No acute osseous or soft tissue abnormality.
IMPRESSION: 1. Bandlike areas of opacities in the mid lungs may reflect
subsegmental atelectasis.
2. Some patchy suprahilar and apical opacities are present with
airways thickening and bronchitic features could reflect ongoing
infection or inflammation.

## 2020-12-15 IMAGING — DX DG ABDOMEN 2V
3 series · 3 of 3 positions shown · non-contrast
Comparison: Abdominal x-ray dated February 03, 2019.

CLINICAL DATA: Abdominal pain.  Capsule study last week.

EXAM:
ABDOMEN - 2 VIEW

[abdomen erect]
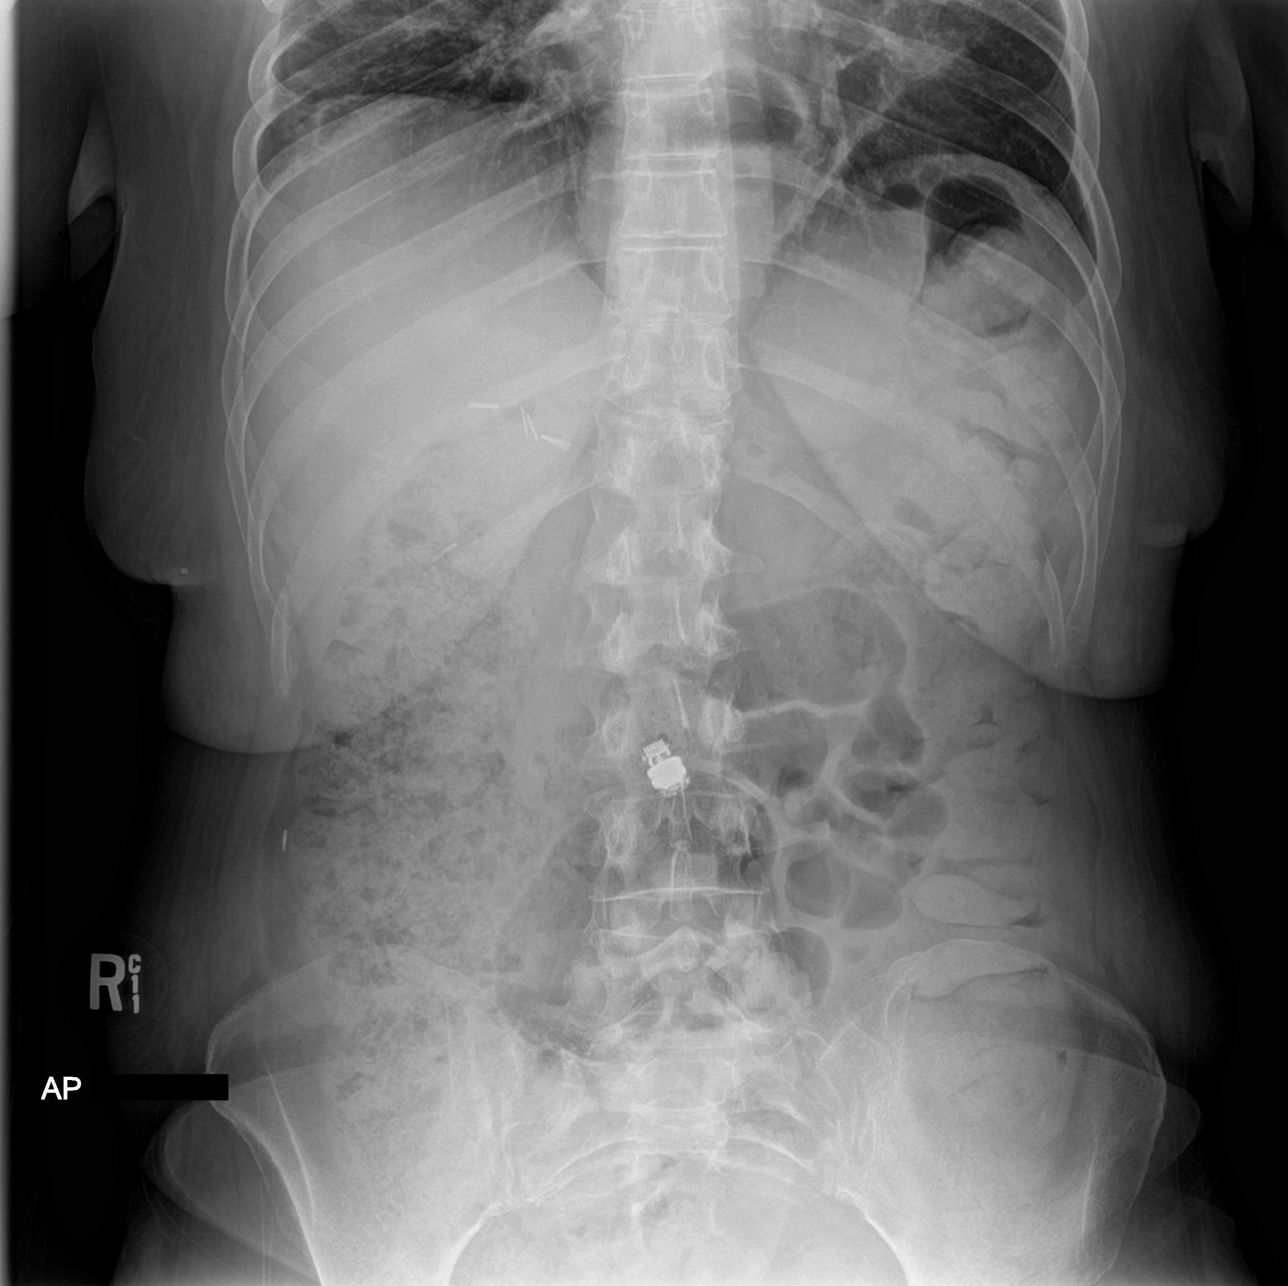

[abdomen supine (1 of 2)]
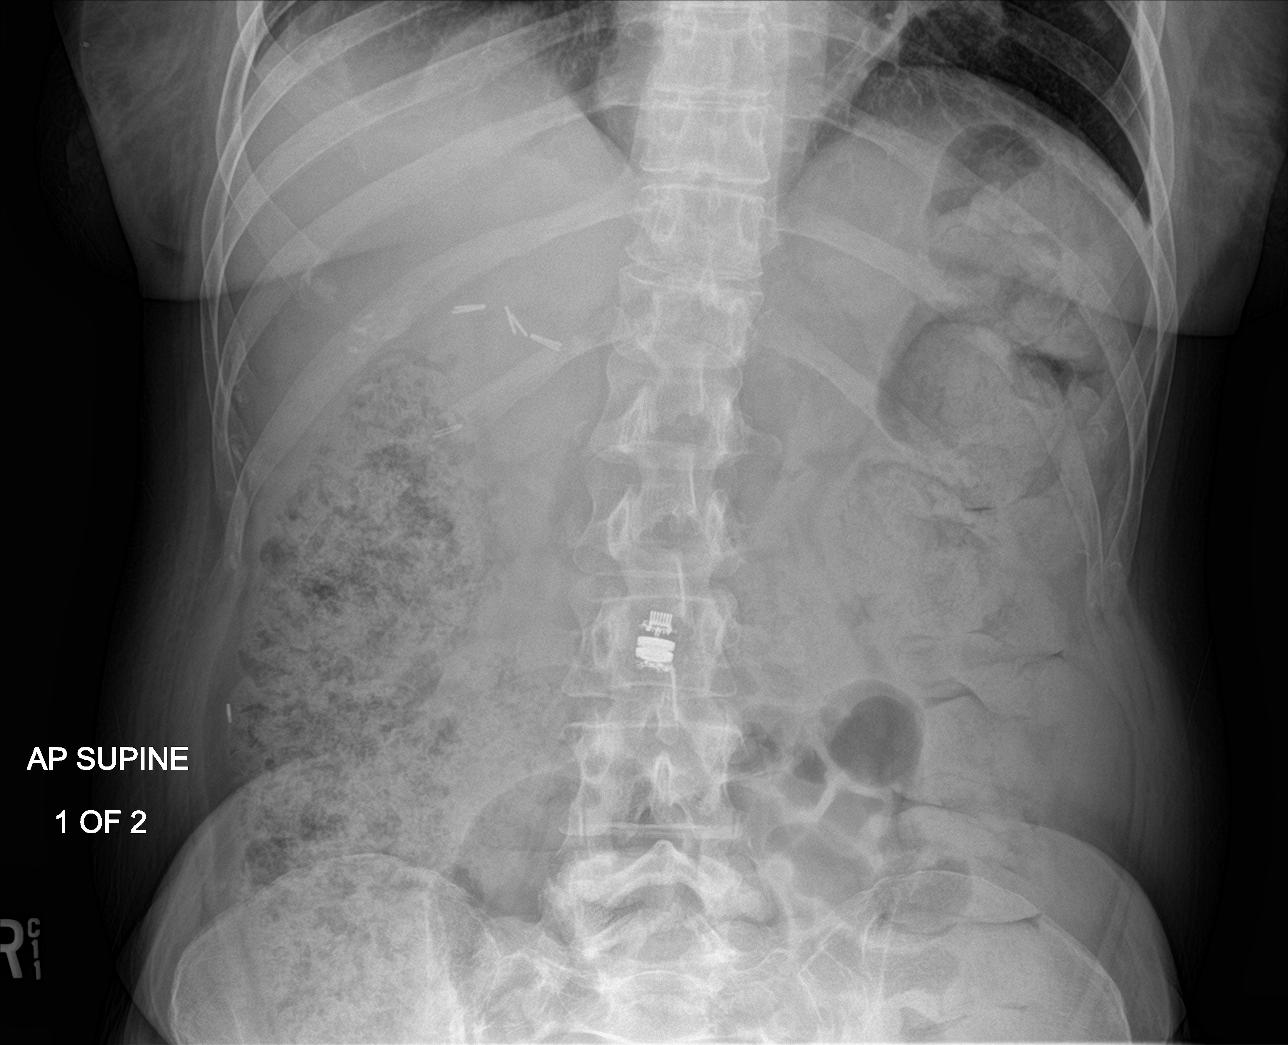

[abdomen supine (2 of 2)]
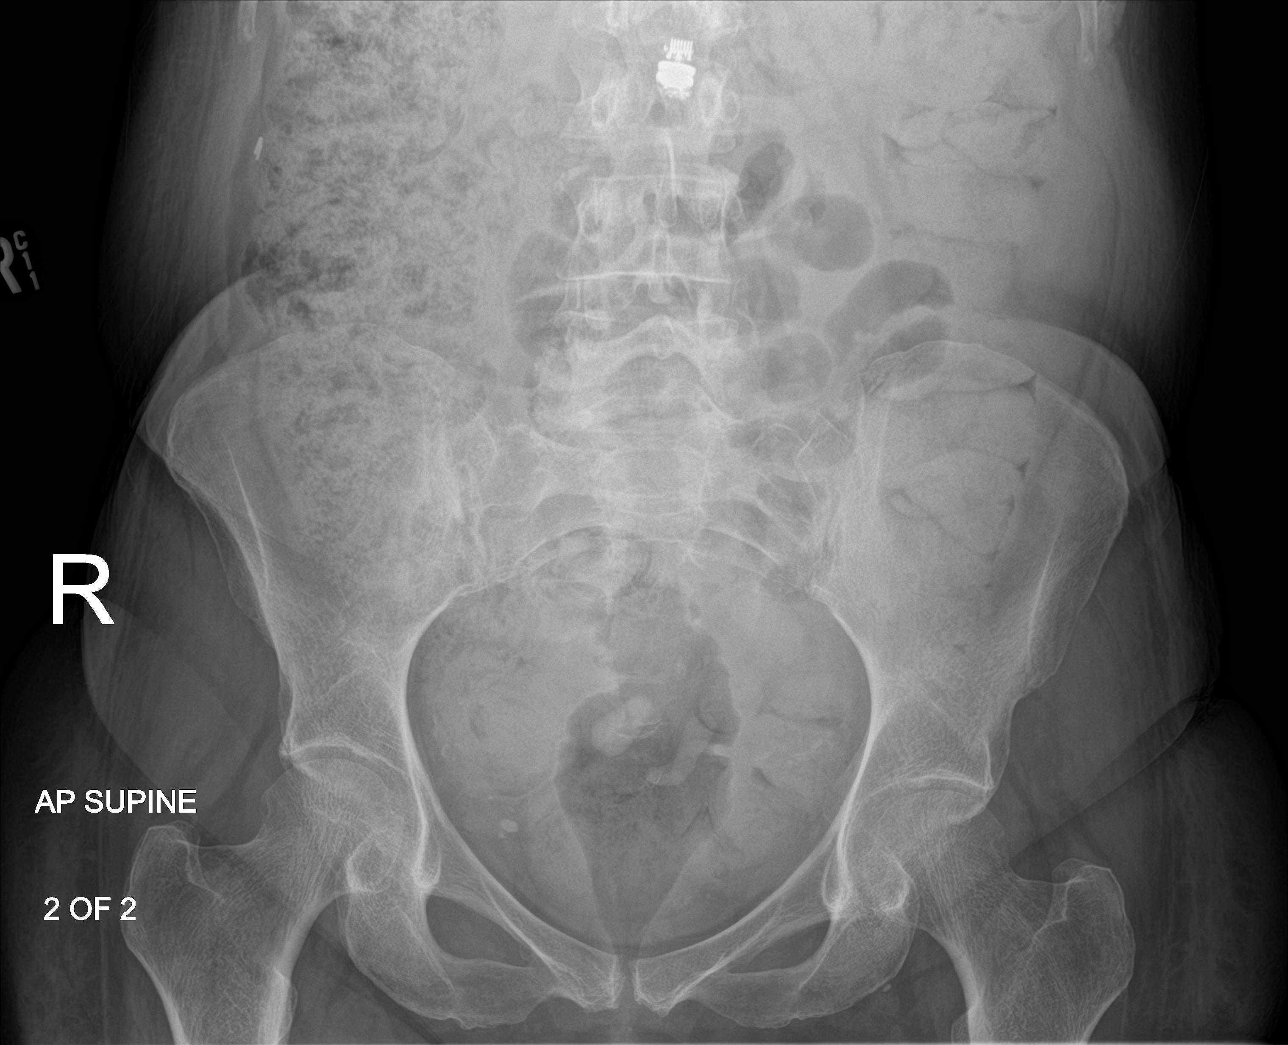

[3 of 3 positions shown; findings below may reference images not displayed]

FINDINGS: The bowel gas pattern is normal. There is no evidence of free air.
Moderate amount of stool throughout the colon. Capsule in the
central abdomen. Unchanged moderate hiatal hernia. No radio-opaque
calculi or other significant radiographic abnormality is seen. No
acute osseous abnormality.
IMPRESSION: 1. Capsule in the central abdomen. Consider lateral view or CT to
assess whether it is in the small bowel or transverse colon.
2. No acute findings.  Moderate colonic stool burden.
3. Unchanged moderate hiatal hernia.

## 2020-12-16 IMAGING — DX DG ABD PORTABLE 2V
3 series · 3 of 3 positions shown · non-contrast
Comparison: 05/29/2019.

CLINICAL DATA: Abdominal pain.  Capsule study.

EXAM:
PORTABLE ABDOMEN - 2 VIEW

[abdomen supine (1 of 3)]
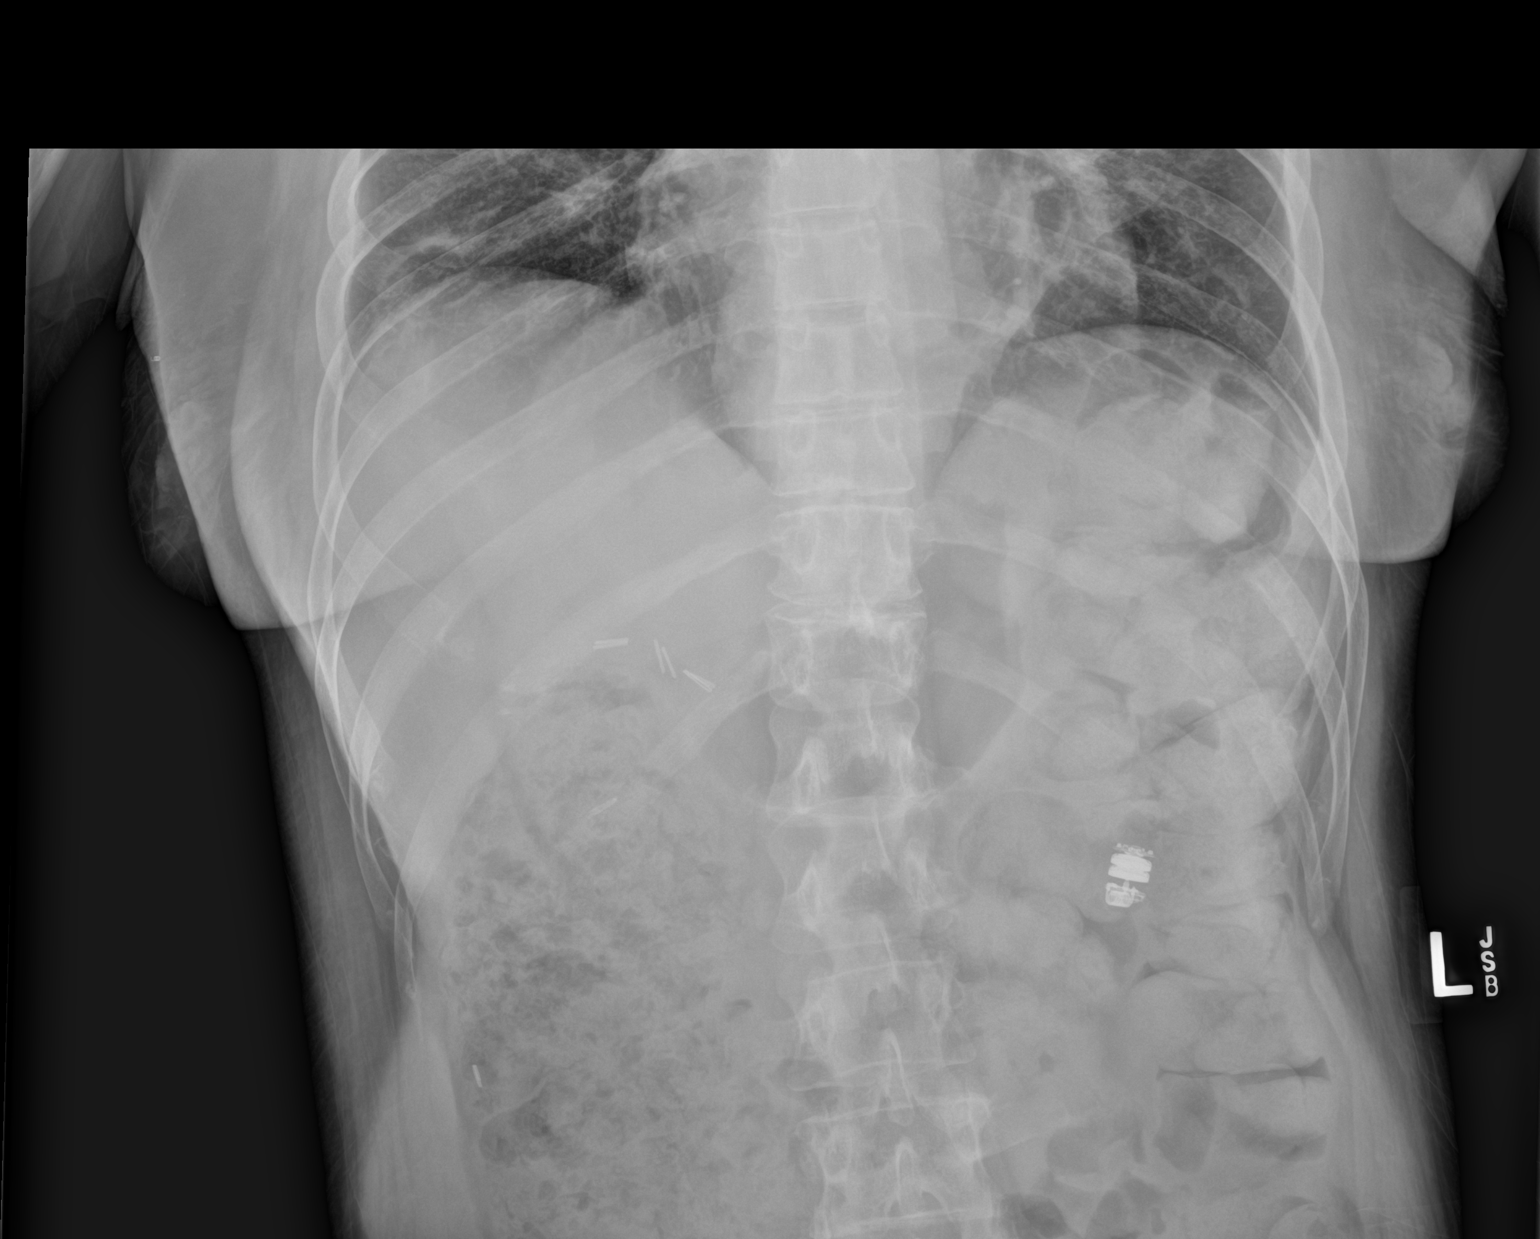

[abdomen supine (2 of 3)]
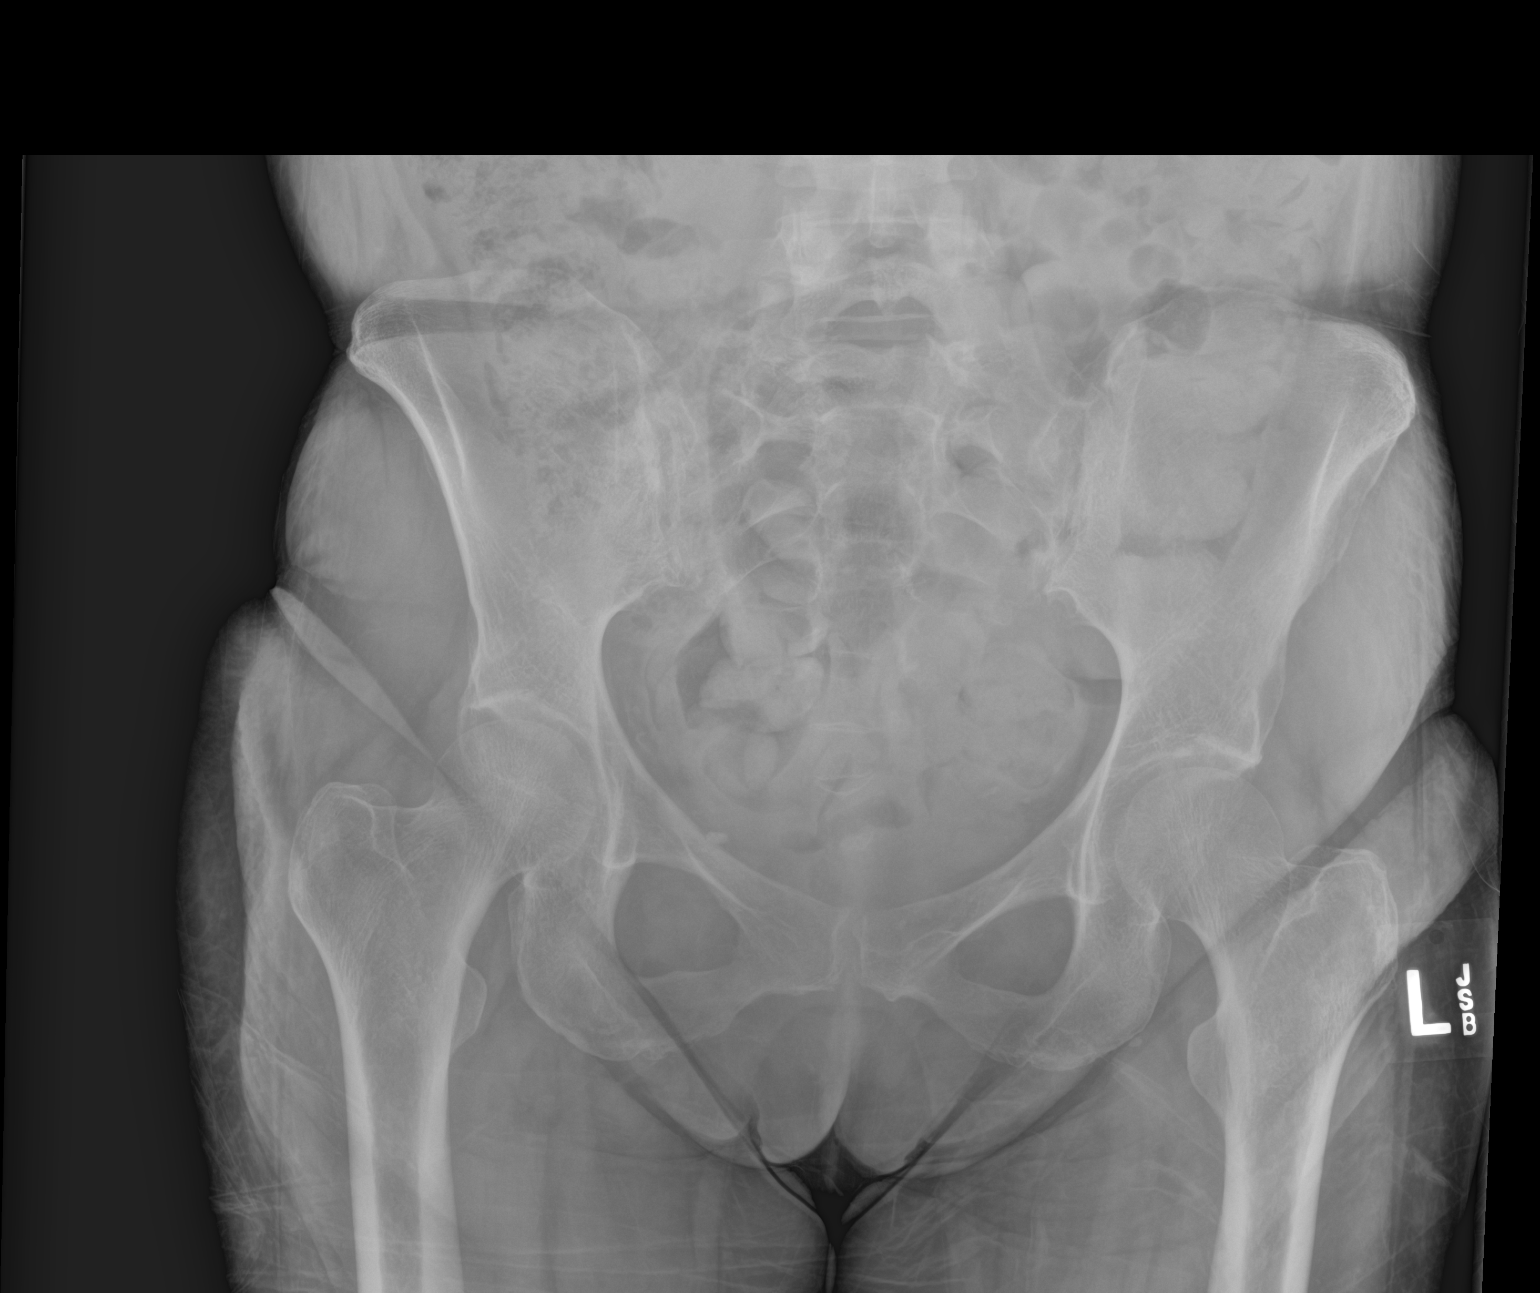

[abdomen supine (3 of 3)]
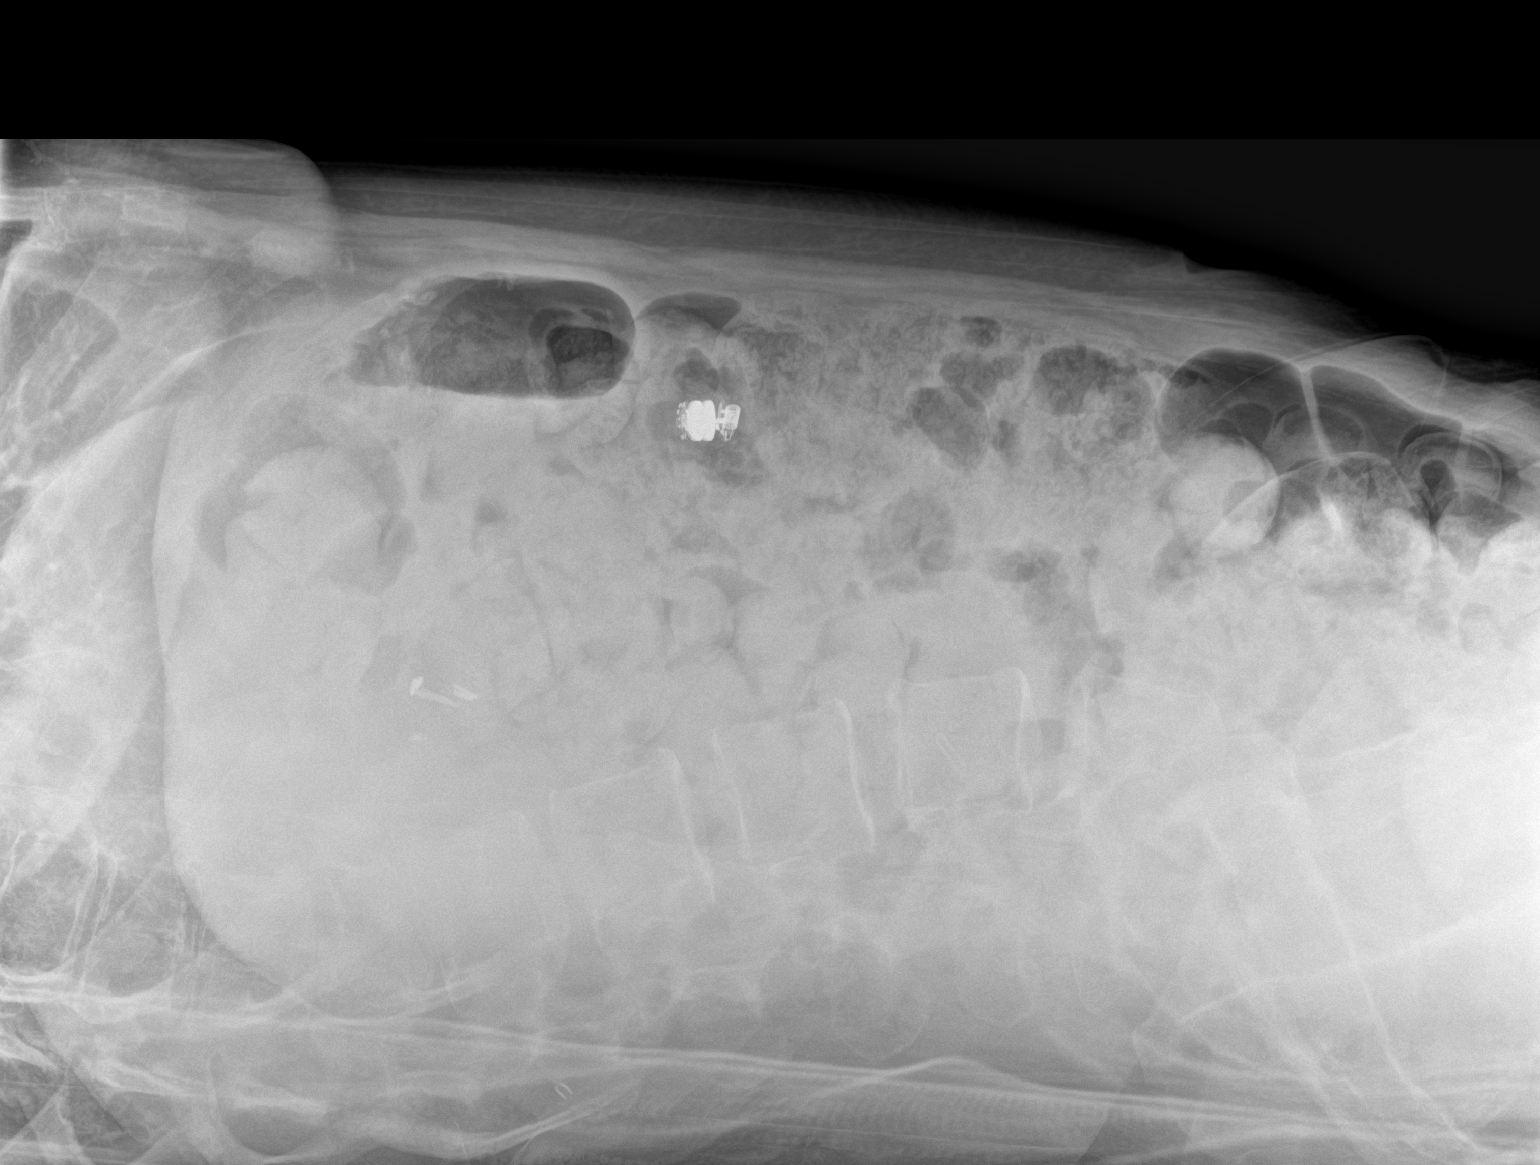

[3 of 3 positions shown; findings below may reference images not displayed]

FINDINGS: Capsule noted over the left mid abdomen. Surgical clips right upper
quadrant scratched it surgical clips right abdomen. Very large
amount of stool noted throughout the colon suggesting constipation.
Fecal impaction cannot be excluded. No bowel distention or free air.
Pelvic calcifications consistent with phleboliths. Bibasilar
subsegmental atelectasis. No acute bony abnormality.
IMPRESSION: 1.  Capsule noted over the left mid abdomen.

2. Large amount of stool noted throughout the colon suggesting
constipation. Fecal impaction cannot be excluded.

3.  Bibasilar atelectasis.

## 2021-04-03 ENCOUNTER — Ambulatory Visit: Payer: Medicare HMO | Admitting: Podiatry

## 2021-04-07 ENCOUNTER — Ambulatory Visit: Payer: Medicare Other | Admitting: Podiatry

## 2021-04-15 ENCOUNTER — Other Ambulatory Visit: Payer: Self-pay

## 2021-04-15 ENCOUNTER — Ambulatory Visit (INDEPENDENT_AMBULATORY_CARE_PROVIDER_SITE_OTHER): Payer: Medicare Other | Admitting: Podiatry

## 2021-04-15 ENCOUNTER — Encounter: Payer: Self-pay | Admitting: Podiatry

## 2021-04-15 DIAGNOSIS — E1142 Type 2 diabetes mellitus with diabetic polyneuropathy: Secondary | ICD-10-CM

## 2021-04-15 DIAGNOSIS — S90455A Superficial foreign body, left lesser toe(s), initial encounter: Secondary | ICD-10-CM

## 2021-04-15 DIAGNOSIS — L600 Ingrowing nail: Secondary | ICD-10-CM | POA: Diagnosis not present

## 2021-04-20 NOTE — Progress Notes (Signed)
Subjective: [name] is a 50 y.o. female patient seen today for follow up of  at risk foot care with h/o diabetic neuropathy. She has been trimming her own toenails, but states she has been walking barefoot on her deck.   She also states she had a blister on her left 5th toe which has resolved. She denies wearing any shoe gear which would rub her toes.   Patient states their blood glucose was 128 mg/dl this morning.   PCP is Redmond School, MD. Last visit was: 03/19/2020.   Allergies  Allergen Reactions   Penicillins Shortness Of Breath     Has patient had a PCN reaction causing immediate rash, facial/tongue/throat swelling, SOB or lightheadedness with hypotension: Yes Has patient had a PCN reaction causing severe rash involving mucus membranes or skin necrosis: Yes Has patient had a PCN reaction that required hospitalization No Has patient had a PCN reaction occurring within the last 10 years: No  If all of the above answers are "NO", then may proceed with Cephalosporin use. Tolerated ceftriaxone   Sulfa Antibiotics Shortness Of Breath   Aspirin Nausea And Vomiting   Enoxaparin Other (See Comments)    Has been known to cause her blood clots in her legs Has been known to cause her blood clots in her legs   Lovenox  [Enoxaparin Sodium] Other (See Comments)    Has been known to cause her blood clots in her legs   Prednisone Other (See Comments)    Pancreatitis, but has to take for asthma sometimes    PCP is Redmond School, MD.  She states her last visit to the office was yesterday.  Objective: Physical Exam  General: Patient is a pleasant 50 y.o. Caucasian female WD, WN in NAD. AAO x 3.   Neurovascular Examination: Capillary fill time to digits <3 seconds b/l lower extremities. Palpable DP pulse(s) b/l lower extremities Faintly palpable PT pulse(s) b/l lower extremities. Pedal hair sparse. Lower extremity skin temperature gradient within normal limits. No pain with calf  compression b/l. No edema noted b/l lower extremities.  Protective sensation intact 5/5 intact bilaterally with 10g monofilament b/l. Vibratory sensation intact b/l. Protective sensation diminished with 10g monofilament b/l. Vibratory sensation intact b/l.  Dermatological:  Skin warm and supple b/l lower extremities. No open wounds b/l lower extremities. No interdigital macerations b/l lower extremities. Toenails 1-5 right, L 2nd toe, L 3rd toe, L 4th toe, and L 5th toe well maintained with adequate length. No erythema, no edema, no drainage, no fluctuance. Incurvated nailplate medial border(s) L hallux.  Nail border hypertrophy minimal. There is tenderness to palpation. Sign(s) of infection: no clinical signs of infection noted on examination today.. Visible splinter noted plantar aspect of left great toe. Splinter is superficial to dermis. No erythema, no edema, no drainage, no fluctuance.  Musculoskeletal:  Normal muscle strength 5/5 to all lower extremity muscle groups bilaterally. No pain crepitus or joint limitation noted with ROM b/l lower extremities. Hammertoe(s) noted to the L 4th toe, L 5th toe, R 4th toe, and R 5th toe.  Assessment: 1. Splinter of toe of left foot, initial encounter   2. Ingrown toenail without infection   3. Diabetic peripheral neuropathy associated with type 2 diabetes mellitus ([place])     Plan: Patient was evaluated and treated and all questions answered. Consent given for treatment as described below: -Examined patient. -Counseled patient on the importance of not walking indoors nor outdoors barefoot. She related understanding. -Utliizing sterile scalpel blade,  splinter was removed from left hallux without penetration of deep tissue. Digit was cleansed with alcohol. Triple antibiotic ointment and band-aid applied. She is to apply Neosporin once daily for one week . -Continue diabetic foot care principles: inspect feet daily, monitor glucose as recommended by PCP  and/or Endocrinologist, and follow prescribed diet per PCP, Endocrinologist and/or dietician. -Patient to continue soft, supportive shoe gear daily. -Offending nail border debrided and curretaged L hallux utilizing sterile nail nipper and currette. Border(s) cleansed with alcohol and TAO applied. Patient instructed to apply Neosporn to L hallux once daily for 7 days. -Patient to report any pedal injuries to medical professional immediately. -Patient/POA to call should there be question/concern in the interim.  Return in about 3 months (around 07/15/2021).  Marzetta Board, DPM

## 2021-07-15 ENCOUNTER — Ambulatory Visit: Payer: Medicare Other | Admitting: Podiatry

## 2021-08-23 ENCOUNTER — Inpatient Hospital Stay (HOSPITAL_COMMUNITY)
Admission: EM | Admit: 2021-08-23 | Discharge: 2021-08-26 | DRG: 871 | Disposition: A | Discharge status: Home / Self Care | Payer: Medicare Other | Attending: Internal Medicine | Admitting: Internal Medicine

## 2021-08-23 ENCOUNTER — Emergency Department (HOSPITAL_COMMUNITY): Payer: Medicare Other

## 2021-08-23 ENCOUNTER — Other Ambulatory Visit: Payer: Self-pay

## 2021-08-23 ENCOUNTER — Encounter (HOSPITAL_COMMUNITY): Payer: Self-pay | Admitting: Emergency Medicine

## 2021-08-23 DIAGNOSIS — I10 Essential (primary) hypertension: Secondary | ICD-10-CM | POA: Diagnosis present

## 2021-08-23 DIAGNOSIS — E1140 Type 2 diabetes mellitus with diabetic neuropathy, unspecified: Secondary | ICD-10-CM | POA: Diagnosis present

## 2021-08-23 DIAGNOSIS — Z79899 Other long term (current) drug therapy: Secondary | ICD-10-CM

## 2021-08-23 DIAGNOSIS — J441 Chronic obstructive pulmonary disease with (acute) exacerbation: Secondary | ICD-10-CM | POA: Diagnosis not present

## 2021-08-23 DIAGNOSIS — J9601 Acute respiratory failure with hypoxia: Secondary | ICD-10-CM | POA: Diagnosis present

## 2021-08-23 DIAGNOSIS — G894 Chronic pain syndrome: Secondary | ICD-10-CM | POA: Diagnosis present

## 2021-08-23 DIAGNOSIS — R0902 Hypoxemia: Secondary | ICD-10-CM

## 2021-08-23 DIAGNOSIS — K861 Other chronic pancreatitis: Secondary | ICD-10-CM | POA: Diagnosis present

## 2021-08-23 DIAGNOSIS — Z8249 Family history of ischemic heart disease and other diseases of the circulatory system: Secondary | ICD-10-CM

## 2021-08-23 DIAGNOSIS — J209 Acute bronchitis, unspecified: Secondary | ICD-10-CM | POA: Diagnosis present

## 2021-08-23 DIAGNOSIS — Z720 Tobacco use: Secondary | ICD-10-CM | POA: Diagnosis not present

## 2021-08-23 DIAGNOSIS — F319 Bipolar disorder, unspecified: Secondary | ICD-10-CM | POA: Diagnosis present

## 2021-08-23 DIAGNOSIS — J9621 Acute and chronic respiratory failure with hypoxia: Secondary | ICD-10-CM | POA: Diagnosis not present

## 2021-08-23 DIAGNOSIS — Z87891 Personal history of nicotine dependence: Secondary | ICD-10-CM | POA: Diagnosis not present

## 2021-08-23 DIAGNOSIS — Z20822 Contact with and (suspected) exposure to covid-19: Secondary | ICD-10-CM | POA: Diagnosis present

## 2021-08-23 DIAGNOSIS — Z809 Family history of malignant neoplasm, unspecified: Secondary | ICD-10-CM | POA: Diagnosis not present

## 2021-08-23 DIAGNOSIS — Z79890 Hormone replacement therapy (postmenopausal): Secondary | ICD-10-CM

## 2021-08-23 DIAGNOSIS — R6520 Severe sepsis without septic shock: Secondary | ICD-10-CM | POA: Diagnosis not present

## 2021-08-23 DIAGNOSIS — E785 Hyperlipidemia, unspecified: Secondary | ICD-10-CM | POA: Diagnosis present

## 2021-08-23 DIAGNOSIS — Z7984 Long term (current) use of oral hypoglycemic drugs: Secondary | ICD-10-CM

## 2021-08-23 DIAGNOSIS — Z90710 Acquired absence of both cervix and uterus: Secondary | ICD-10-CM | POA: Diagnosis not present

## 2021-08-23 DIAGNOSIS — Z833 Family history of diabetes mellitus: Secondary | ICD-10-CM | POA: Diagnosis not present

## 2021-08-23 DIAGNOSIS — J440 Chronic obstructive pulmonary disease with acute lower respiratory infection: Secondary | ICD-10-CM | POA: Diagnosis present

## 2021-08-23 DIAGNOSIS — E119 Type 2 diabetes mellitus without complications: Secondary | ICD-10-CM | POA: Diagnosis not present

## 2021-08-23 DIAGNOSIS — A419 Sepsis, unspecified organism: Principal | ICD-10-CM | POA: Diagnosis present

## 2021-08-23 DIAGNOSIS — J189 Pneumonia, unspecified organism: Secondary | ICD-10-CM | POA: Diagnosis present

## 2021-08-23 DIAGNOSIS — J181 Lobar pneumonia, unspecified organism: Secondary | ICD-10-CM

## 2021-08-23 DIAGNOSIS — E039 Hypothyroidism, unspecified: Secondary | ICD-10-CM | POA: Diagnosis present

## 2021-08-23 DIAGNOSIS — K219 Gastro-esophageal reflux disease without esophagitis: Secondary | ICD-10-CM | POA: Diagnosis present

## 2021-08-23 DIAGNOSIS — J449 Chronic obstructive pulmonary disease, unspecified: Secondary | ICD-10-CM | POA: Diagnosis present

## 2021-08-23 DIAGNOSIS — Z9049 Acquired absence of other specified parts of digestive tract: Secondary | ICD-10-CM

## 2021-08-23 DIAGNOSIS — Z7951 Long term (current) use of inhaled steroids: Secondary | ICD-10-CM | POA: Diagnosis not present

## 2021-08-23 DIAGNOSIS — R296 Repeated falls: Secondary | ICD-10-CM | POA: Diagnosis present

## 2021-08-23 DIAGNOSIS — F17200 Nicotine dependence, unspecified, uncomplicated: Secondary | ICD-10-CM | POA: Diagnosis present

## 2021-08-23 DIAGNOSIS — Z9981 Dependence on supplemental oxygen: Secondary | ICD-10-CM

## 2021-08-23 LAB — CBC WITH DIFFERENTIAL/PLATELET
Abs Immature Granulocytes: 0.1 10*3/uL — ABNORMAL HIGH (ref 0.00–0.07)
Basophils Absolute: 0.1 10*3/uL (ref 0.0–0.1)
Basophils Relative: 0 %
Eosinophils Absolute: 0 10*3/uL (ref 0.0–0.5)
Eosinophils Relative: 0 %
HCT: 35.2 % — ABNORMAL LOW (ref 36.0–46.0)
Hemoglobin: 11 g/dL — ABNORMAL LOW (ref 12.0–15.0)
Immature Granulocytes: 1 %
Lymphocytes Relative: 5 %
Lymphs Abs: 0.9 10*3/uL (ref 0.7–4.0)
MCH: 28.4 pg (ref 26.0–34.0)
MCHC: 31.3 g/dL (ref 30.0–36.0)
MCV: 90.7 fL (ref 80.0–100.0)
Monocytes Absolute: 1.3 10*3/uL — ABNORMAL HIGH (ref 0.1–1.0)
Monocytes Relative: 7 %
Neutro Abs: 14.9 10*3/uL — ABNORMAL HIGH (ref 1.7–7.7)
Neutrophils Relative %: 87 %
Platelets: 174 10*3/uL (ref 150–400)
RBC: 3.88 MIL/uL (ref 3.87–5.11)
RDW: 23.9 % — ABNORMAL HIGH (ref 11.5–15.5)
WBC: 17.2 10*3/uL — ABNORMAL HIGH (ref 4.0–10.5)
nRBC: 0 % (ref 0.0–0.2)

## 2021-08-23 LAB — LIPASE, BLOOD: Lipase: 19 U/L (ref 11–51)

## 2021-08-23 LAB — COMPREHENSIVE METABOLIC PANEL
ALT: 14 U/L (ref 0–44)
AST: 25 U/L (ref 15–41)
Albumin: 2.9 g/dL — ABNORMAL LOW (ref 3.5–5.0)
Alkaline Phosphatase: 76 U/L (ref 38–126)
Anion gap: 12 (ref 5–15)
BUN: 24 mg/dL — ABNORMAL HIGH (ref 6–20)
CO2: 23 mmol/L (ref 22–32)
Calcium: 8.3 mg/dL — ABNORMAL LOW (ref 8.9–10.3)
Chloride: 100 mmol/L (ref 98–111)
Creatinine, Ser: 1.12 mg/dL — ABNORMAL HIGH (ref 0.44–1.00)
GFR, Estimated: 59 mL/min — ABNORMAL LOW (ref >60)
Glucose, Bld: 174 mg/dL — ABNORMAL HIGH (ref 70–99)
Potassium: 3.6 mmol/L (ref 3.5–5.1)
Sodium: 135 mmol/L (ref 135–145)
Total Bilirubin: 0.8 mg/dL (ref 0.3–1.2)
Total Protein: 6 g/dL — ABNORMAL LOW (ref 6.5–8.1)

## 2021-08-23 LAB — URINALYSIS, ROUTINE W REFLEX MICROSCOPIC
Bilirubin Urine: NEGATIVE
Glucose, UA: NEGATIVE mg/dL
Hgb urine dipstick: NEGATIVE
Ketones, ur: NEGATIVE mg/dL
Leukocytes,Ua: NEGATIVE
Nitrite: NEGATIVE
Protein, ur: NEGATIVE mg/dL
Specific Gravity, Urine: 1.01 (ref 1.005–1.030)
pH: 5.5 (ref 5.0–8.0)

## 2021-08-23 LAB — PROTIME-INR
INR: 1 (ref 0.8–1.2)
Prothrombin Time: 13 seconds (ref 11.4–15.2)

## 2021-08-23 LAB — RESP PANEL BY RT-PCR (FLU A&B, COVID) ARPGX2
Influenza A by PCR: NEGATIVE
Influenza B by PCR: NEGATIVE
SARS Coronavirus 2 by RT PCR: NEGATIVE

## 2021-08-23 LAB — APTT: aPTT: 30 seconds (ref 24–36)

## 2021-08-23 LAB — LACTIC ACID, PLASMA
Lactic Acid, Venous: 4.1 mmol/L (ref 0.5–1.9)
Lactic Acid, Venous: 4.8 mmol/L (ref 0.5–1.9)
Lactic Acid, Venous: 5.2 mmol/L (ref 0.5–1.9)

## 2021-08-23 MED ORDER — SODIUM CHLORIDE 0.9 % IV SOLN
2.0000 g | Freq: Once | INTRAVENOUS | Status: AC
  Administered 2021-08-23: 2 g via INTRAVENOUS
  Filled 2021-08-23: qty 2

## 2021-08-23 MED ORDER — LACTATED RINGERS IV BOLUS (SEPSIS)
1000.0000 mL | Freq: Once | INTRAVENOUS | Status: AC
  Administered 2021-08-23: 1000 mL via INTRAVENOUS

## 2021-08-23 MED ORDER — METHYLPREDNISOLONE SODIUM SUCC 125 MG IJ SOLR
125.0000 mg | Freq: Once | INTRAMUSCULAR | Status: AC
  Administered 2021-08-23: 125 mg via INTRAVENOUS
  Filled 2021-08-23: qty 2

## 2021-08-23 MED ORDER — LACTATED RINGERS IV BOLUS (SEPSIS)
250.0000 mL | Freq: Once | INTRAVENOUS | Status: AC
  Administered 2021-08-23: 250 mL via INTRAVENOUS

## 2021-08-23 MED ORDER — LACTATED RINGERS IV SOLN: INTRAVENOUS | Status: DC

## 2021-08-23 MED ORDER — SODIUM CHLORIDE 0.9 % IV SOLN: 2.0000 g | Freq: Four times a day (QID) | INTRAVENOUS | Status: DC

## 2021-08-23 MED ORDER — METHYLPREDNISOLONE SODIUM SUCC 125 MG IJ SOLR: 60.0000 mg | Freq: Two times a day (BID) | INTRAMUSCULAR | Status: DC

## 2021-08-23 MED ORDER — LACTATED RINGERS IV BOLUS (SEPSIS)
500.0000 mL | Freq: Once | INTRAVENOUS | Status: AC
  Administered 2021-08-23: 500 mL via INTRAVENOUS

## 2021-08-23 MED ORDER — VANCOMYCIN HCL IN DEXTROSE 1-5 GM/200ML-% IV SOLN
1000.0000 mg | Freq: Once | INTRAVENOUS | Status: AC
  Administered 2021-08-23: 1000 mg via INTRAVENOUS
  Filled 2021-08-23: qty 200

## 2021-08-23 MED ORDER — METRONIDAZOLE 500 MG/100ML IV SOLN
500.0000 mg | Freq: Once | INTRAVENOUS | Status: AC
  Administered 2021-08-23: 500 mg via INTRAVENOUS
  Filled 2021-08-23: qty 100

## 2021-08-23 MED ORDER — ALBUTEROL SULFATE (2.5 MG/3ML) 0.083% IN NEBU
2.5000 mg | INHALATION_SOLUTION | Freq: Once | RESPIRATORY_TRACT | Status: AC
  Administered 2021-08-23: 2.5 mg via RESPIRATORY_TRACT
  Filled 2021-08-23: qty 3

## 2021-08-23 MED ORDER — SODIUM CHLORIDE 0.9 % IV SOLN: INTRAVENOUS | Status: AC

## 2021-08-23 MED ORDER — ALBUTEROL SULFATE (2.5 MG/3ML) 0.083% IN NEBU
INHALATION_SOLUTION | RESPIRATORY_TRACT | Status: AC
  Administered 2021-08-23: 2.5 mg
  Filled 2021-08-23: qty 3

## 2021-08-23 MED ORDER — IPRATROPIUM-ALBUTEROL 0.5-2.5 (3) MG/3ML IN SOLN
RESPIRATORY_TRACT | Status: AC
  Administered 2021-08-23: 3 mL
  Filled 2021-08-23: qty 3

## 2021-08-23 MED ORDER — VANCOMYCIN HCL 500 MG/100ML IV SOLN
500.0000 mg | Freq: Two times a day (BID) | INTRAVENOUS | Status: DC
  Filled 2021-08-23: qty 100

## 2021-08-23 NOTE — Progress Notes (Signed)
Pharmacy Antibiotic Note  [name] a 51 y.o. female admitted on 08/23/2021 with sepsis.  Pharmacy has been consulted for vancomycin and aztreonam dosing.  Plan: Vancomyin 1gm iv x 1 followed by vancomycin 500mg  iv q12h  Aztreonam 2gm IV every 8 hours.  Medical History: Past Medical History:  Diagnosis Date   Anemia    Anxiety    Asthma    Bipolar 1 disorder ([place])    Chronic abdominal pain    COPD (chronic obstructive pulmonary disease) ([facility])    Depression    Diabetes mellitus    Diabetic neuropathy ([place]) 10/29/2017   Esophagitis    Hiatal hernia    Hyperlipidemia 02/03/2012   Migraine    Neck pain 06/08/2012   Pancreatitis chronic Idiopathic   Treated by Dr. Newman Pies at [facility] in the past with celiac blocks.    Allergies:  Allergies  Allergen Reactions   Penicillins Shortness Of Breath     Has patient had a PCN reaction causing immediate rash, facial/tongue/throat swelling, SOB or lightheadedness with hypotension: Yes Has patient had a PCN reaction causing severe rash involving mucus membranes or skin necrosis: Yes Has patient had a PCN reaction that required hospitalization No Has patient had a PCN reaction occurring within the last 10 years: No  If all of the above answers are "NO", then may proceed with Cephalosporin use. Tolerated ceftriaxone   Sulfa Antibiotics Shortness Of Breath   Aspirin Nausea And Vomiting   Enoxaparin Other (See Comments)    Has been known to cause her blood clots in her legs Has been known to cause her blood clots in her legs   Lovenox  [Enoxaparin Sodium] Other (See Comments)    Has been known to cause her blood clots in her legs   Prednisone Other (See Comments)    Pancreatitis, but has to take for asthma sometimes    Filed Weights   08/23/21 1539  Weight: 56.7 kg (125 lb)    CBC Latest Ref Rng & Units 08/23/2021 10/09/2019 05/31/2019  WBC 4.0 - 10.5 K/uL 17.2(H) 11.9(H) 9.4  Hemoglobin 12.0 - 15.0 g/dL 11.0(L) 11.3  11.6(L)  Hematocrit 36.0 - 46.0 % 35.2(L) 34.6 37.4  Platelets 150 - 400 K/uL 174 307 418(H)     Estimated Creatinine Clearance: 46.5 mL/min (A) (by C-G formula based on SCr of 1.12 mg/dL (H)).  Antibiotics Given (last 72 hours)     Date/Time Action Medication Dose Rate   08/23/21 1720 New Bag/Given   aztreonam (AZACTAM) 2 g in sodium chloride 0.9 % 100 mL IVPB 2 g 200 mL/hr       Antimicrobials this admission:  aztreonam 08/23/2021  >>  vancomycin 08/23/2021  >>  Metronidazole 08/23/2021   x 1   Microbiology results: 08/23/2021  BCx: sent 08/23/2021  UCx: sent 08/23/2021  Resp Panel: sent  08/23/2021  MRSA PCR: sent  Thank you for allowing pharmacy to be a part of this patients care.  Thomasenia Sales, PharmD Clinical Pharmacist

## 2021-08-23 NOTE — ED Notes (Signed)
Pt put on 2liter nasal cannula 02 up to 93%

## 2021-08-23 NOTE — ED Triage Notes (Addendum)
Pt  has hx of recurrent PNA. c/o right shoulder, upper abdominal pain and back pain. Pt also has hx of chronic pancreatitis. O2 sats currently 86-88% on RA. Placed on 2L [place]. Pt also reports multiple falls and dizziness x 2-3 days

## 2021-08-23 NOTE — ED Provider Notes (Signed)
[facility] Provider Note   CSN: [id number] Arrival date & time: 08/23/21  1530     History  No chief complaint on file.   [name] is a 51 y.o. female.  Patient with a history of recurrent pneumonia.  With a complaint of some right shoulder pain.  Has sort of chronic upper abdominal pain known to have some chronic pancreatitis.  Patient has oxygen available at home but does not use it on a regular basis.  Her oxygen sats on room air were 86 to 88%.  Patient placed on 2 L nasal cannula oxygen sats came up into the 90s.  Patient also reports multiple falls and dizziness.  She has had a cough and occasionally has streaks of blood in it.  Felt like she is had a fever.  Patient's vital signs meeting sepsis criteria.  No recent admissions.  Patient temp here 98 with heart rates 112 respirations were as high as 24 blood pressure was 96/52.  Past medical history significant for bipolar disorder asthma/COPD chronic abdominal pain chronic pancreatitis diabetes hyperlipidemia patient's had her gallbladder removed has had an abdominal hysterectomy.  Patient has not been seen and the emergency department and all of 2022.        Home Medications Prior to Admission medications   Medication Sig Start Date End Date Taking? Authorizing Provider  acetaminophen (TYLENOL) 500 MG tablet Take by mouth. 06/02/19   [provider]  albuterol (PROVENTIL HFA;VENTOLIN HFA) 108 (90 BASE) MCG/ACT inhaler Inhale 2 puffs into the lungs daily as needed for wheezing. For shortness of breath    [provider]  albuterol (PROVENTIL) (2.5 MG/3ML) 0.083% nebulizer solution Take 2.5 mg by nebulization every 6 (six) hours as needed for wheezing or shortness of breath.  05/24/19   [provider]  amitriptyline (ELAVIL) 100 MG tablet Take 100 mg by mouth at bedtime.    [provider]  amLODipine (NORVASC) 10 MG tablet Take 10 mg by mouth daily. 09/27/19    [provider]  atorvastatin (LIPITOR) 20 MG tablet Take 20 mg by mouth daily.    [provider]  azithromycin (ZITHROMAX) 250 MG tablet Take 1 tablet (250 mg total) by mouth daily. Take first 2 tablets together, then 1 every day until finished. 11/14/19   Avegno, Darrelyn Hillock, FNP  bisacodyl (DULCOLAX) 5 MG EC tablet Take 1 tablet (5 mg total) by mouth 2 (two) times daily. 10/09/19   Wieters, Hallie C, PA-C  budesonide-formoterol (SYMBICORT) 80-4.5 MCG/ACT inhaler Inhale 2 puffs into the lungs 2 (two) times daily. 03/30/14   Samuella Cota, MD  busPIRone (BUSPAR) 10 MG tablet Take 10 mg by mouth 3 (three) times daily.    [provider]  citalopram (CELEXA) 40 MG tablet Take 40 mg by mouth daily. 05/17/19   [provider]  clonazePAM (KLONOPIN) 0.5 MG tablet Take by mouth.    [provider]  docusate sodium (COLACE) 100 MG capsule Take 100 mg by mouth at bedtime.     [provider]  ferrous sulfate 325 (65 FE) MG tablet Take 325 mg by mouth daily.    [provider]  furosemide (LASIX) 20 MG tablet  02/19/20   [provider]  gabapentin (NEURONTIN) 300 MG capsule Take 300 mg by mouth 3 (three) times daily.  04/11/19   [provider]  hydrOXYzine (ATARAX/VISTARIL) 25 MG tablet Take by mouth. 12/28/14   [provider]  ipratropium-albuterol (DUONEB) 0.5-2.5 (  3) MG/3ML SOLN Inhale into the lungs.    [provider]  levothyroxine (SYNTHROID, LEVOTHROID) 25 MCG tablet Take 25 mcg by mouth daily before breakfast.    [provider]  linaclotide (LINZESS) 290 MCG CAPS capsule Take 1 capsule (290 mcg total) by mouth daily before breakfast. 05/31/19 06/30/19  Manuella Ghazi, Pratik D, DO  lipase/protease/amylase (CREON) 36000 UNITS CPEP capsule Take by mouth. 05/31/19   [provider]  lisinopril (PRINIVIL,ZESTRIL) 2.5 MG tablet Take 2.5 mg by mouth daily.  01/18/18   [provider]   LORazepam (ATIVAN) 1 MG tablet Take by mouth.    [provider]  magnesium oxide (MAG-OX) 400 MG tablet Take by mouth. 06/02/19   [provider]  metFORMIN (GLUCOPHAGE) 500 MG tablet Take 2 tablets by mouth 2 (two) times daily.    [provider]  methocarbamol (ROBAXIN) 500 MG tablet Take 500 mg by mouth 3 (three) times daily.    [provider]  mupirocin cream (BACTROBAN) 2 % Apply 1 application topically daily. Apply to right 2nd toe and left great toe once daily until healed. 03/13/20   Marzetta Board, DPM  ondansetron (ZOFRAN) 4 MG tablet Take by mouth. 06/02/19   [provider]  oxyCODONE (ROXICODONE) 15 MG immediate release tablet Take 15 mg by mouth 4 (four) times daily.  05/22/19   [provider]  OXYGEN Inhale 3 L into the lungs daily as needed (for shortness of breath).    [provider]  Pancrelipase, Lip-Prot-Amyl, (ZENPEP) 20000-63000 units CPEP Take 1 capsule by mouth three times daily with meals and 1 capsules with snacks. 09/11/19   [provider]  pantoprazole (PROTONIX) 40 MG tablet Take 40 mg by mouth 2 (two) times daily.    [provider]  polyethylene glycol powder (GLYCOLAX/MIRALAX) powder Take 17 g by mouth 2 (two) times daily. Until daily soft stools  OTC 03/05/15   Nona Dell, PA-C  promethazine (PHENERGAN) 25 MG tablet Take 25 mg by mouth every 6 (six) hours as needed for nausea or vomiting.    [provider]  [facility] injection  12/28/19   [provider]  SPIRIVA HANDIHALER 18 MCG inhalation capsule Place 18 mcg into inhaler and inhale daily.  04/11/19   [provider]  traMADol Veatrice Bourbon) 50 MG tablet  06/06/19   [provider]  valACYclovir (VALTREX) 500 MG tablet Take 500 mg by mouth daily.    [provider]  ZENPEP 16109-60454 units CPEP  02/16/20   [provider]      Allergies    Penicillins, Sulfa  antibiotics, Aspirin, Enoxaparin, Lovenox  [enoxaparin sodium], and Prednisone    Review of Systems   Review of Systems  Constitutional:  Positive for fever. Negative for chills.  HENT:  Positive for congestion. Negative for ear pain and sore throat.   Eyes:  Negative for pain and visual disturbance.  Respiratory:  Positive for cough, shortness of breath and wheezing.   Cardiovascular:  Negative for chest pain and palpitations.  Gastrointestinal:  Positive for abdominal pain. Negative for diarrhea, nausea and vomiting.  Genitourinary:  Negative for dysuria and hematuria.  Musculoskeletal:  Negative for arthralgias and back pain.  Skin:  Negative for color change and rash.  Neurological:  Negative for seizures and syncope.  All other systems reviewed and are negative.  Physical Exam Updated Vital Signs BP (!) 96/52    Pulse (!) 112    Temp 98 F (36.7  C) (Oral)    Resp 13    Ht 1.575 m (5\' 2" )    Wt 56.7 kg    SpO2 (!) 88%    BMI 22.86 kg/m  Physical Exam Vitals and nursing note reviewed.  Constitutional:      General: She is not in acute distress.    Appearance: Normal appearance. She is well-developed.  HENT:     Head: Normocephalic and atraumatic.  Eyes:     Extraocular Movements: Extraocular movements intact.     Conjunctiva/sclera: Conjunctivae normal.     Pupils: Pupils are equal, round, and reactive to light.  Cardiovascular:     Rate and Rhythm: Regular rhythm. Tachycardia present.     Heart sounds: No murmur heard. Pulmonary:     Effort: Pulmonary effort is normal. No respiratory distress.     Breath sounds: No wheezing.  Abdominal:     Palpations: Abdomen is soft.     Tenderness: There is no abdominal tenderness. There is no guarding.     Comments: Abdomen soft no objective tenderness  Musculoskeletal:        General: No swelling.     Cervical back: Normal range of motion and neck supple. No rigidity.     Right lower leg: No edema.     Left lower leg: No edema.   Skin:    General: Skin is warm and dry.     Capillary Refill: Capillary refill takes less than 2 seconds.  Neurological:     General: No focal deficit present.     Mental Status: She is alert and oriented to person, place, and time.     Cranial Nerves: No cranial nerve deficit.     Sensory: No sensory deficit.  Psychiatric:        Mood and Affect: Mood normal.    ED Results / Procedures / Treatments   Labs (all labs ordered are listed, but only abnormal results are displayed) Labs Reviewed - No data to display  EKG None  Radiology No results found.  Procedures Procedures  Cardiac monitor consistent with sinus tachycardia  Medications Ordered in ED Medications - No data to display  ED Course/ Medical Decision Making/ A&P                           Medical Decision Making Amount and/or Complexity of Data Reviewed Labs: ordered. Radiology: ordered. ECG/medicine tests: ordered.  Risk Prescription drug management.   CRITICAL CARE Performed by: Fredia Sorrow Total critical care time: 45 minutes Critical care time was exclusive of separately billable procedures and treating other patients. Critical care was necessary to treat or prevent imminent or life-threatening deterioration. Critical care was time spent personally by me on the following activities: development of treatment plan with patient and/or surrogate as well as nursing, discussions with consultants, evaluation of patient's response to treatment, examination of patient, obtaining history from patient or surrogate, ordering and performing treatments and interventions, ordering and review of laboratory studies, ordering and review of radiographic studies, pulse oximetry and re-evaluation of patient's condition.  Patient on presentation concerning for sepsis presentation tachypneic tachycardic blood pressure in the 90s.  Patient giving history of fever.  Sepsis protocol initiated started on broad-spectrum  antibiotics.  Chest x-ray portable consistent with right lower lobe pneumonia.  COVID testing influenza testing still pending.  Patient has a history of chronic pancreatitis.  The lipase is normal liver function test are normal.  Renal function GFR 59.  Leukocytosis with white blood cell count of 17.2.  Lactic acid elevated at 4.1.  Reinforcing the sepsis diagnosis.  Patient with hypoxia not normally on oxygen but requiring 2 L of oxygen.  Blood cultures done and pending.  With the fluid challenge 30 cc/kg patient's blood pressures are now up into the low 913W systolic.  Patient's oxygen sats are around 93%.  Patient now feels as if she is wheezing.  There is some wheezing on lung fields.  Patient will be given albuterol nebulizer treatment.  Patient will require admission.  Hospitalist will be contacted for the admission.  Patient currently hemodynamically stable.  Final Clinical Impression(s) / ED Diagnoses Final diagnoses:  None    Rx / DC Orders ED Discharge Orders     None         Fredia Sorrow, MD 08/23/21 1910

## 2021-08-23 NOTE — H&P (Signed)
History and Physical  [name]:[id number] DOB: 1971/06/08 DOA: 08/23/2021  Referring physician: Dr Rogene Houston, ED physician PCP: Redmond School, MD  Outpatient Specialists:   Patient Coming From: home  Chief Complaint: weakness, cough, sob  HPI: [name] is a 51 y.o. female with a history of diabetes, COPD, hypertension, hyperlipidemia.  Patient presents with shoulder pain, shortness of breath with oxygen saturations between 86 and 88% on room air.  She has been having multiple falls and dizzy with productive cough with streaks of blood in it.  Her shortness of breath is worse with activity and improved with rest.  No other palliating or provoking factors.  She does feel like she has had a fever at home.  Emergency Department Course: Initial blood pressure is 96/52 with respirations of 24 and a heart rate of 112.  Code sepsis called.  Patient received broad-spectrum antibiotics, 30 mg/kg IV fluids.  Lactic acid 4.1 that increased to 4.8 on repeat.  The increase in lactic acid was done prior to completion of IV fluids.  White count 17.2  Review of Systems:   Pt denies any nausea, vomiting, diarrhea, constipation, abdominal pain, palpitations, headache, vision changes, lightheadedness, dizziness, melena, rectal bleeding.  Review of systems are otherwise negative  Past Medical History:  Diagnosis Date   Anemia    Anxiety    Asthma    Bipolar 1 disorder (HCC)    Chronic abdominal pain    COPD (chronic obstructive pulmonary disease) ([place])    Depression    Diabetes mellitus    Diabetic neuropathy ([place]) 10/29/2017   Esophagitis    Hiatal hernia    Hyperlipidemia 02/03/2012   Migraine    Neck pain 06/08/2012   Pancreatitis chronic Idiopathic   Treated by Dr. Newman Pies at [facility] in the past with celiac blocks.   Past Surgical History:  Procedure Laterality Date   ABDOMINAL HYSTERECTOMY     attempted colonoscopy  02/2017   Dr. Newman Pies: Poor prep, scope passed  to mid transverse colon before colonoscopy aborted.  No significant findings noted to mid transverse colon.  Next colonoscopy in 2 years.   CELIAC PLEXUS BLOCK     CHOLECYSTECTOMY     COLONOSCOPY N/A 12/05/2012   NFA:OZHYQM rectum, colon and terminal ileum   ESOPHAGOGASTRODUODENOSCOPY  02/20/2008   VHQ:IONGEXBMW distal esophageal mucosa, suspicious for neoplasm/Hiatal hernia otherwise normal    ESOPHAGOGASTRODUODENOSCOPY  04/03/2008   UXL:[id number] hiatal hernia, otherwise normal/Short, tight, benign-appearing peptic stricture   ESOPHAGOGASTRODUODENOSCOPY  November 16, 2012   Dr. Britta Mccreedy: hiatal hernia, esophagitis,    ESOPHAGOGASTRODUODENOSCOPY (EGD) WITH PROPOFOL N/A 05/09/2019   Dr. Oneida Alar: Large hiatal hernia with erythema and edema in the pouch, mild gastritis.  No biopsies taken.   ESOPHAGOGASTRODUODENOSCOPY (EGD) WITH PROPOFOL N/A 05/26/2019   Procedure: ESOPHAGOGASTRODUODENOSCOPY (EGD) WITH PROPOFOL;  Surgeon: Danie Binder, MD;  [facility];  Service: Endoscopy;  Laterality: N/A;   EUS  12/2018   Dr. Newman Pies: LA grade B esophagitis, antritis but negative H. pylori, 1 cm ulcer at the duodenal sweep   GIVENS CAPSULE STUDY N/A 12/05/2012   Few superficial erosions but nothing found to explain iron deficiency anemia.    GIVENS CAPSULE STUDY N/A 05/26/2019   Procedure: GIVENS CAPSULE STUDY;  Surgeon: Danie Binder, MD;  Location: [facility];  Service: Endoscopy;  Laterality: N/A;   Ileocolonoscopy  06/05/2008     YQI:[id number] anal canal, otherwise normal rectum, colon   TONSILLECTOMY     Social  History:  reports that she has quit smoking. She has never used smokeless tobacco. She reports that she does not drink alcohol and does not use drugs. Patient lives at home  Allergies  Allergen Reactions   Penicillins Shortness Of Breath     Has patient had a PCN reaction causing immediate rash, facial/tongue/throat swelling, SOB or lightheadedness with hypotension: Yes Has  patient had a PCN reaction causing severe rash involving mucus membranes or skin necrosis: Yes Has patient had a PCN reaction that required hospitalization No Has patient had a PCN reaction occurring within the last 10 years: No  If all of the above answers are "NO", then may proceed with Cephalosporin use. Tolerated ceftriaxone   Sulfa Antibiotics Shortness Of Breath   Aspirin Nausea And Vomiting   Enoxaparin Other (See Comments)    Has been known to cause her blood clots in her legs Has been known to cause her blood clots in her legs   Lovenox  [Enoxaparin Sodium] Other (See Comments)    Has been known to cause her blood clots in her legs   Prednisone Other (See Comments)    Pancreatitis, but has to take for asthma sometimes    Family History  Problem Relation Age of Onset   Asthma Other    Asthma Paternal Grandfather    Heart attack Paternal Grandfather    Hypertension Mother    Cancer Maternal Grandmother    Diabetes Paternal Grandmother    Coronary artery disease Neg Hx    Colon cancer Neg Hx       Prior to Admission medications   Medication Sig Start Date End Date Taking? Authorizing Provider  acetaminophen (TYLENOL) 500 MG tablet Take by mouth. 06/02/19  Yes [provider]  albuterol (PROVENTIL HFA;VENTOLIN HFA) 108 (90 BASE) MCG/ACT inhaler Inhale 2 puffs into the lungs daily as needed for wheezing. For shortness of breath   Yes [provider]  albuterol (PROVENTIL) (2.5 MG/3ML) 0.083% nebulizer solution Take 2.5 mg by nebulization every 6 (six) hours as needed for wheezing or shortness of breath.  05/24/19  Yes [provider]  amitriptyline (ELAVIL) 100 MG tablet Take 100 mg by mouth at bedtime.   Yes [provider]  atorvastatin (LIPITOR) 20 MG tablet Take 20 mg by mouth daily.   Yes [provider]  bisacodyl (DULCOLAX) 5 MG EC tablet Take 1 tablet (5 mg total) by mouth 2 (two) times daily. 10/09/19  Yes Wieters, Hallie C,  PA-C  budesonide-formoterol (SYMBICORT) 80-4.5 MCG/ACT inhaler Inhale 2 puffs into the lungs 2 (two) times daily. Patient taking differently: Inhale 2 puffs into the lungs daily. 03/30/14  Yes Samuella Cota, MD  busPIRone (BUSPAR) 10 MG tablet Take 10 mg by mouth 3 (three) times daily.   Yes [provider]  citalopram (CELEXA) 40 MG tablet Take 40 mg by mouth daily. 05/17/19  Yes [provider]  ferrous sulfate 325 (65 FE) MG tablet Take 325 mg by mouth daily.   Yes [provider]  furosemide (LASIX) 20 MG tablet Take 20 mg by mouth daily. 02/19/20  Yes [provider]  gabapentin (NEURONTIN) 300 MG capsule Take 300 mg by mouth 3 (three) times daily.  04/11/19  Yes [provider]  levothyroxine (SYNTHROID, LEVOTHROID) 25 MCG tablet Take 25 mcg by mouth daily before breakfast.   Yes [provider]  lisinopril (PRINIVIL,ZESTRIL) 2.5 MG tablet Take 2.5 mg by mouth daily.  01/18/18  Yes [provider]  metFORMIN (GLUCOPHAGE) 500 MG tablet Take 2 tablets by mouth 2 (two) times daily.   Yes [provider]  methocarbamol (ROBAXIN) 500 MG tablet Take 500 mg by mouth 3 (three) times daily.   Yes [provider]  mupirocin cream (BACTROBAN) 2 % Apply 1 application topically daily. Apply to right 2nd toe and left great toe once daily until healed. 03/13/20  Yes Galaway, Jennifer L, DPM  ondansetron (ZOFRAN) 4 MG tablet Take 4 mg by mouth every 8 (eight) hours as needed for vomiting or nausea. 06/02/19  Yes [provider]  oxyCODONE (ROXICODONE) 15 MG immediate release tablet Take 15 mg by mouth 4 (four) times daily.  05/22/19  Yes [provider]  OXYGEN Inhale 3 L into the lungs daily as needed (for shortness of breath).   Yes [provider]  Pancrelipase, Lip-Prot-Amyl, (ZENPEP) 20000-63000 units CPEP Take 1 capsule by mouth 3 times a day with meals and 1 capsules with snacks. 09/11/19  Yes  [provider]  pantoprazole (PROTONIX) 40 MG tablet Take 40 mg by mouth 2 (two) times daily.   Yes [provider]  polyethylene glycol powder (GLYCOLAX/MIRALAX) powder Take 17 g by mouth 2 (two) times daily. Until daily soft stools  OTC 03/05/15  Yes Nona Dell, PA-C  SPIRIVA HANDIHALER 18 MCG inhalation capsule Place 18 mcg into inhaler and inhale daily.  04/11/19  Yes [provider]  valACYclovir (VALTREX) 500 MG tablet Take 500 mg by mouth daily.   Yes [provider]  amitriptyline (ELAVIL) 100 MG tablet Take by mouth.    [provider]  azithromycin (ZITHROMAX) 250 MG tablet Take 1 tablet (250 mg total) by mouth daily. Take first 2 tablets together, then 1 every day until finished. Patient not taking: Reported on 08/23/2021 11/14/19   Emerson Monte, FNP  linaclotide [facility]) 290 MCG CAPS capsule Take 1 capsule (290 mcg total) by mouth daily before breakfast. Patient not taking: Reported on 08/23/2021 05/31/19 06/30/19  Heath Lark D, DO    Physical Exam: BP (!) 122/56    Pulse (!) 109    Temp 98 F (36.7 C) (Oral)    Resp 19    Ht 5\' 2"  (1.575 m)    Wt 56.7 kg    SpO2 99%    BMI 22.86 kg/m   General: Middle-age female. Awake and alert and oriented x3. No acute cardiopulmonary distress.  HEENT: Normocephalic atraumatic.  Right and left ears normal in appearance.  Pupils equal, round, reactive to light. Extraocular muscles are intact. Sclerae anicteric and noninjected.  Moist mucosal membranes. No mucosal lesions.  Neck: Neck supple without lymphadenopathy. No carotid bruits. No masses palpated.  Cardiovascular: Regular rate with normal S1-S2 sounds. No murmurs, rubs, gallops auscultated. No JVD.  Respiratory: Rales in bases, but particularly on the left side.  No accessory muscle use. Abdomen: Soft, nontender, nondistended. Active bowel sounds. No masses or hepatosplenomegaly  Skin: No rashes, lesions, or ulcerations.  Dry,  warm to touch. 2+ dorsalis pedis and radial pulses. Musculoskeletal: No calf or leg pain. All major joints not erythematous nontender.  No upper or lower joint deformation.  Good ROM.  No contractures  Psychiatric: Intact judgment and insight. Pleasant and cooperative. Neurologic: No focal neurological deficits. Strength is 5/5 and symmetric in upper and lower extremities.  Cranial nerves II through XII are grossly intact.           Labs on Admission: I have personally reviewed following labs  and imaging studies  CBC: Recent Labs  Lab 08/23/21 1640  WBC 17.2*  NEUTROABS 14.9*  HGB 11.0*  HCT 35.2*  MCV 90.7  PLT 160   Basic Metabolic Panel: Recent Labs  Lab 08/23/21 1640  NA 135  K 3.6  CL 100  CO2 23  GLUCOSE 174*  BUN 24*  CREATININE 1.12*  CALCIUM 8.3*   GFR: Estimated Creatinine Clearance: 46.5 mL/min (A) (by C-G formula based on SCr of 1.12 mg/dL (H)). Liver Function Tests: Recent Labs  Lab 08/23/21 1640  AST 25  ALT 14  ALKPHOS 76  BILITOT 0.8  PROT 6.0*  ALBUMIN 2.9*   Recent Labs  Lab 08/23/21 1640  LIPASE 19   No results for input(s): AMMONIA in the last 168 hours. Coagulation Profile: Recent Labs  Lab 08/23/21 1640  INR 1.0   Cardiac Enzymes: No results for input(s): CKTOTAL, CKMB, CKMBINDEX, TROPONINI in the last 168 hours. BNP (last 3 results) No results for input(s): PROBNP in the last 8760 hours. HbA1C: No results for input(s): HGBA1C in the last 72 hours. CBG: No results for input(s): GLUCAP in the last 168 hours. Lipid Profile: No results for input(s): CHOL, HDL, LDLCALC, TRIG, CHOLHDL, LDLDIRECT in the last 72 hours. Thyroid Function Tests: No results for input(s): TSH, T4TOTAL, FREET4, T3FREE, THYROIDAB in the last 72 hours. Anemia Panel: No results for input(s): VITAMINB12, FOLATE, FERRITIN, TIBC, IRON, RETICCTPCT in the last 72 hours. Urine analysis:    Component Value Date/Time   COLORURINE YELLOW 08/23/2021 1906    APPEARANCEUR CLEAR 08/23/2021 1906   LABSPEC 1.010 08/23/2021 1906   PHURINE 5.5 08/23/2021 1906   GLUCOSEU NEGATIVE 08/23/2021 1906   HGBUR NEGATIVE 08/23/2021 1906   BILIRUBINUR NEGATIVE 08/23/2021 1906   BILIRUBINUR negative 10/09/2019 1342   KETONESUR NEGATIVE 08/23/2021 1906   PROTEINUR NEGATIVE 08/23/2021 1906   UROBILINOGEN 0.2 10/09/2019 1342   UROBILINOGEN 0.2 12/06/2014 1819   NITRITE NEGATIVE 08/23/2021 1906   LEUKOCYTESUR NEGATIVE 08/23/2021 1906   Sepsis Labs: @LABRCNTIP (procalcitonin:4,lacticidven:4) ) Recent Results (from the past 240 hour(s))  Blood Culture (routine x 2)     Status: None (Preliminary result)   Collection Time: 08/23/21  4:40 PM   Specimen: Blood  Result Value Ref Range Status   Specimen Description   Final    RIGHT ANTECUBITAL BOTTLES DRAWN AEROBIC AND ANAEROBIC   Special Requests   Final    Blood Culture results may not be optimal due to an excessive volume of blood received in culture bottles Performed at [facility], [address], [place], [place] [address]    Culture PENDING  Incomplete   Report Status PENDING  Incomplete  Blood Culture (routine x 2)     Status: None (Preliminary result)   Collection Time: 08/23/21  5:16 PM   Specimen: Blood  Result Value Ref Range Status   Specimen Description   Final    LEFT ANTECUBITAL BOTTLES DRAWN AEROBIC AND ANAEROBIC   Special Requests   Final    Blood Culture adequate volume Performed at [facility], [address], [place], [place] [address]    Culture PENDING  Incomplete   Report Status PENDING  Incomplete  Resp Panel by RT-PCR (Flu A&B, Covid) Nasopharyngeal Swab     Status: None   Collection Time: 08/23/21  7:01 PM   Specimen: Nasopharyngeal Swab; Nasopharyngeal(NP) swabs in vial transport medium  Result Value Ref Range Status   SARS Coronavirus 2 by RT PCR NEGATIVE NEGATIVE Final    Comment: (  NOTE) SARS-CoV-2 target nucleic acids are NOT DETECTED.  The SARS-CoV-2 RNA is generally  detectable in upper respiratory specimens during the acute phase of infection. The lowest concentration of SARS-CoV-2 viral copies this assay can detect is 138 copies/mL. A negative result does not preclude SARS-Cov-2 infection and should not be used as the sole basis for treatment or other patient management decisions. A negative result may occur with  improper specimen collection/handling, submission of specimen other than nasopharyngeal swab, presence of viral mutation(s) within the areas targeted by this assay, and inadequate number of viral copies(<138 copies/mL). A negative result must be combined with clinical observations, patient history, and epidemiological information. The expected result is Negative.  Fact Sheet for Patients:  EntrepreneurPulse.com.au  Fact Sheet for Healthcare Providers:  IncredibleEmployment.be  This test is no t yet approved or cleared by the [place] FDA and  has been authorized for detection and/or diagnosis of SARS-CoV-2 by FDA under an Emergency Use Authorization (EUA). This EUA will remain  in effect (meaning this test can be used) for the duration of the COVID-19 declaration under Section 564(b)(1) of the Act, 21 U.S.C.section 360bbb-3(b)(1), unless the authorization is terminated  or revoked sooner.       Influenza A by PCR NEGATIVE NEGATIVE Final   Influenza B by PCR NEGATIVE NEGATIVE Final    Comment: (NOTE) The Xpert Xpress SARS-CoV-2/FLU/RSV plus assay is intended as an aid in the diagnosis of influenza from Nasopharyngeal swab specimens and should not be used as a sole basis for treatment. Nasal washings and aspirates are unacceptable for Xpert Xpress SARS-CoV-2/FLU/RSV testing.  Fact Sheet for Patients: EntrepreneurPulse.com.au  Fact Sheet for Healthcare Providers: IncredibleEmployment.be  This test is not yet approved or cleared by the [place] FDA  and has been authorized for detection and/or diagnosis of SARS-CoV-2 by FDA under an Emergency Use Authorization (EUA). This EUA will remain in effect (meaning this test can be used) for the duration of the COVID-19 declaration under Section 564(b)(1) of the Act, 21 U.S.C. section 360bbb-3(b)(1), unless the authorization is terminated or revoked.  Performed at [facility], [address] [place], [place] [address]      Radiological Exams on Admission: DG Chest Port 1 View  Result Date: 08/23/2021 CLINICAL DATA:  pneumonia history EXAM: PORTABLE CHEST 1 VIEW COMPARISON:  May 30, 2021 FINDINGS: The cardiomediastinal silhouette is unchanged in contour. No pleural effusion. No pneumothorax. New patchy airspace opacities throughout the predominately RIGHT lower lung. There are few scattered airspace opacities in the LEFT lung. Status post cholecystectomy. IMPRESSION: New patchy airspace opacities throughout the majority of the RIGHT lower lung. Findings are concerning for multifocal infection. Followup PA and lateral chest X-ray is recommended in 3-4 weeks following trial of antibiotic therapy to ensure resolution and exclude underlying malignancy. Electronically Signed   By: Valentino Saxon M.D.   On: 08/23/2021 16:23    EKG: Pending  Assessment/Plan: Principal Problem:   Sepsis ([place]) Active Problems:   Essential hypertension   GERD   Acute respiratory failure with hypoxia (HCC)   Acute bronchitis   COPD exacerbation (HCC)   DM type 2 (diabetes mellitus, type 2) ([place])   Hypothyroid    This patient was discussed with the ED physician, including pertinent vitals, physical exam findings, labs, and imaging.  We also discussed care given by the ED provider.  Sepsis Blood pressure is a little soft, but improving with IV fluids. Hold antihypertensives Acute respiratory failure with hypoxia Improved with supplemental oxygen Acute  COPD exacerbation with community-acquired  pneumonia Antibiotics: Rocephin and azithromycin Robitussin Blood cultures drawn in the emergency department Sputum cultures CBC tomorrow Strep and Legionella antigen by urine Influenza and COVID screen negative Steroids: Solu-Medrol 125 given, followed by 60 mg twice daily Nebulizer treatments: DuoNebs every 6 LABA with ICS Diabetes type 2 Not on hypoglycemics. CBGs before meals and nightly due to steroids Sliding scale Hypothyroidism Continue levothyroxine Hypertension Will hold for the present GERD  DVT prophylaxis: SCDs as has hypersensitivity to lovenox. Consultants: None Code Status: Full code Family Communication: None Disposition Plan: Patient to return home following admission   Truett Mainland, DO

## 2021-08-23 NOTE — Sepsis Progress Note (Signed)
Notified provider and bedside nurse of need to order repeat lactic acid. Elink will continue to follow code sepsis.

## 2021-08-23 NOTE — Progress Notes (Signed)
Elink following for sepsis protocol. 

## 2021-08-24 ENCOUNTER — Encounter (HOSPITAL_COMMUNITY): Payer: Self-pay | Admitting: Internal Medicine

## 2021-08-24 ENCOUNTER — Inpatient Hospital Stay (HOSPITAL_COMMUNITY): Payer: Medicare Other

## 2021-08-24 ENCOUNTER — Other Ambulatory Visit: Payer: Self-pay

## 2021-08-24 DIAGNOSIS — J441 Chronic obstructive pulmonary disease with (acute) exacerbation: Secondary | ICD-10-CM | POA: Diagnosis not present

## 2021-08-24 DIAGNOSIS — A419 Sepsis, unspecified organism: Secondary | ICD-10-CM | POA: Diagnosis not present

## 2021-08-24 DIAGNOSIS — J181 Lobar pneumonia, unspecified organism: Secondary | ICD-10-CM | POA: Diagnosis not present

## 2021-08-24 DIAGNOSIS — J9621 Acute and chronic respiratory failure with hypoxia: Secondary | ICD-10-CM | POA: Diagnosis not present

## 2021-08-24 DIAGNOSIS — R6520 Severe sepsis without septic shock: Secondary | ICD-10-CM | POA: Diagnosis present

## 2021-08-24 LAB — BASIC METABOLIC PANEL
Anion gap: 7 (ref 5–15)
BUN: 17 mg/dL (ref 6–20)
CO2: 26 mmol/L (ref 22–32)
Calcium: 8 mg/dL — ABNORMAL LOW (ref 8.9–10.3)
Chloride: 105 mmol/L (ref 98–111)
Creatinine, Ser: 0.68 mg/dL (ref 0.44–1.00)
GFR, Estimated: >60 mL/min (ref >60)
Glucose, Bld: 163 mg/dL — ABNORMAL HIGH (ref 70–99)
Potassium: 3.4 mmol/L — ABNORMAL LOW (ref 3.5–5.1)
Sodium: 138 mmol/L (ref 135–145)

## 2021-08-24 LAB — GLUCOSE, CAPILLARY
Glucose-Capillary: 208 mg/dL — ABNORMAL HIGH (ref 70–99)
Glucose-Capillary: 84 mg/dL (ref 70–99)
Glucose-Capillary: 189 mg/dL — ABNORMAL HIGH (ref 70–99)

## 2021-08-24 LAB — D-DIMER, QUANTITATIVE: D-Dimer, Quant: 2.87 ug/mL-FEU — ABNORMAL HIGH (ref 0.00–0.50)

## 2021-08-24 LAB — CBG MONITORING, ED: Glucose-Capillary: 154 mg/dL — ABNORMAL HIGH (ref 70–99)

## 2021-08-24 LAB — HEMOGLOBIN A1C
Hgb A1c MFr Bld: 6.5 % — ABNORMAL HIGH (ref 4.8–5.6)
Mean Plasma Glucose: 139.85 mg/dL

## 2021-08-24 LAB — CBC
HCT: 32.8 % — ABNORMAL LOW (ref 36.0–46.0)
Hemoglobin: 10 g/dL — ABNORMAL LOW (ref 12.0–15.0)
MCH: 27.3 pg (ref 26.0–34.0)
MCHC: 30.5 g/dL (ref 30.0–36.0)
MCV: 89.6 fL (ref 80.0–100.0)
Platelets: 170 10*3/uL (ref 150–400)
RBC: 3.66 MIL/uL — ABNORMAL LOW (ref 3.87–5.11)
RDW: 23.9 % — ABNORMAL HIGH (ref 11.5–15.5)
WBC: 14.3 10*3/uL — ABNORMAL HIGH (ref 4.0–10.5)
nRBC: 0 % (ref 0.0–0.2)

## 2021-08-24 LAB — LACTIC ACID, PLASMA
Lactic Acid, Venous: 2.7 mmol/L (ref 0.5–1.9)
Lactic Acid, Venous: 2.5 mmol/L (ref 0.5–1.9)

## 2021-08-24 LAB — PROCALCITONIN: Procalcitonin: 12.31 ng/mL

## 2021-08-24 LAB — HIV ANTIBODY (ROUTINE TESTING W REFLEX): HIV Screen 4th Generation wRfx: NONREACTIVE

## 2021-08-24 MED ORDER — ONDANSETRON HCL 4 MG/2ML IJ SOLN: 4.0000 mg | Freq: Four times a day (QID) | INTRAMUSCULAR | Status: DC | PRN

## 2021-08-24 MED ORDER — PANCRELIPASE (LIP-PROT-AMYL) 12000-38000 UNITS PO CPEP
24000.0000 [IU] | ORAL_CAPSULE | Freq: Three times a day (TID) | ORAL | Status: DC
  Administered 2021-08-24 – 2021-08-26 (×7): 24000 [IU] via ORAL
  Filled 2021-08-24 (×12): qty 2

## 2021-08-24 MED ORDER — ACETAMINOPHEN 325 MG PO TABS
650.0000 mg | ORAL_TABLET | Freq: Four times a day (QID) | ORAL | Status: DC | PRN
  Administered 2021-08-25 – 2021-08-26 (×2): 650 mg via ORAL
  Filled 2021-08-24 (×2): qty 2

## 2021-08-24 MED ORDER — INSULIN ASPART 100 UNIT/ML IJ SOLN
0.0000 [IU] | Freq: Three times a day (TID) | INTRAMUSCULAR | Status: DC
  Administered 2021-08-24: 3 [IU] via SUBCUTANEOUS
  Administered 2021-08-24 – 2021-08-25 (×2): 2 [IU] via SUBCUTANEOUS
  Administered 2021-08-26: 3 [IU] via SUBCUTANEOUS
  Filled 2021-08-24: qty 1

## 2021-08-24 MED ORDER — METHOCARBAMOL 500 MG PO TABS
500.0000 mg | ORAL_TABLET | Freq: Three times a day (TID) | ORAL | Status: DC
  Administered 2021-08-24 – 2021-08-26 (×7): 500 mg via ORAL
  Filled 2021-08-24 (×7): qty 1

## 2021-08-24 MED ORDER — ALBUTEROL SULFATE (2.5 MG/3ML) 0.083% IN NEBU: 2.5000 mg | INHALATION_SOLUTION | RESPIRATORY_TRACT | Status: DC | PRN

## 2021-08-24 MED ORDER — CITALOPRAM HYDROBROMIDE 20 MG PO TABS
40.0000 mg | ORAL_TABLET | Freq: Every day | ORAL | Status: DC
Start: 2021-08-24 — End: 2021-08-26
  Administered 2021-08-24 – 2021-08-26 (×3): 40 mg via ORAL
  Filled 2021-08-24 (×3): qty 2

## 2021-08-24 MED ORDER — PANTOPRAZOLE SODIUM 40 MG PO TBEC
40.0000 mg | DELAYED_RELEASE_TABLET | Freq: Two times a day (BID) | ORAL | Status: DC
  Administered 2021-08-24 – 2021-08-26 (×5): 40 mg via ORAL
  Filled 2021-08-24 (×5): qty 1

## 2021-08-24 MED ORDER — ACETAMINOPHEN 650 MG RE SUPP: 650.0000 mg | Freq: Four times a day (QID) | RECTAL | Status: DC | PRN

## 2021-08-24 MED ORDER — SALINE SPRAY 0.65 % NA SOLN
1.0000 | NASAL | Status: DC | PRN
  Filled 2021-08-24: qty 44

## 2021-08-24 MED ORDER — FLUTICASONE FUROATE-VILANTEROL 100-25 MCG/ACT IN AEPB: 1.0000 | INHALATION_SPRAY | Freq: Every day | RESPIRATORY_TRACT | Status: DC

## 2021-08-24 MED ORDER — UMECLIDINIUM BROMIDE 62.5 MCG/ACT IN AEPB
1.0000 | INHALATION_SPRAY | Freq: Every day | RESPIRATORY_TRACT | Status: DC
  Administered 2021-08-24 – 2021-08-26 (×3): 1 via RESPIRATORY_TRACT
  Filled 2021-08-24: qty 7

## 2021-08-24 MED ORDER — LEVOTHYROXINE SODIUM 50 MCG PO TABS: 25.0000 ug | ORAL_TABLET | Freq: Every day | ORAL | Status: DC

## 2021-08-24 MED ORDER — LEVOTHYROXINE SODIUM 25 MCG PO TABS
25.0000 ug | ORAL_TABLET | Freq: Every day | ORAL | Status: DC
  Administered 2021-08-24 – 2021-08-26 (×3): 25 ug via ORAL
  Filled 2021-08-24 (×3): qty 1

## 2021-08-24 MED ORDER — GABAPENTIN 300 MG PO CAPS
300.0000 mg | ORAL_CAPSULE | Freq: Three times a day (TID) | ORAL | Status: DC
  Administered 2021-08-24 – 2021-08-26 (×7): 300 mg via ORAL
  Filled 2021-08-24 (×7): qty 1

## 2021-08-24 MED ORDER — POTASSIUM CHLORIDE IN NACL 20-0.9 MEQ/L-% IV SOLN: INTRAVENOUS | Status: DC

## 2021-08-24 MED ORDER — INSULIN ASPART 100 UNIT/ML IJ SOLN: 0.0000 [IU] | Freq: Three times a day (TID) | INTRAMUSCULAR | Status: DC

## 2021-08-24 MED ORDER — AMITRIPTYLINE HCL 25 MG PO TABS
100.0000 mg | ORAL_TABLET | Freq: Every day | ORAL | Status: DC
  Administered 2021-08-24 – 2021-08-25 (×2): 100 mg via ORAL
  Filled 2021-08-24 (×2): qty 4

## 2021-08-24 MED ORDER — SODIUM CHLORIDE 0.9 % IV SOLN
500.0000 mg | INTRAVENOUS | Status: DC
  Administered 2021-08-24 – 2021-08-26 (×3): 500 mg via INTRAVENOUS
  Filled 2021-08-24 (×3): qty 5

## 2021-08-24 MED ORDER — ONDANSETRON HCL 4 MG PO TABS: 4.0000 mg | ORAL_TABLET | Freq: Four times a day (QID) | ORAL | Status: DC | PRN

## 2021-08-24 MED ORDER — INFLUENZA VAC SPLIT QUAD 0.5 ML IM SUSY: 0.5000 mL | PREFILLED_SYRINGE | INTRAMUSCULAR | Status: DC

## 2021-08-24 MED ORDER — OXYCODONE HCL 5 MG PO TABS
15.0000 mg | ORAL_TABLET | Freq: Four times a day (QID) | ORAL | Status: DC
  Administered 2021-08-24 – 2021-08-26 (×9): 15 mg via ORAL
  Filled 2021-08-24 (×9): qty 3

## 2021-08-24 MED ORDER — SODIUM CHLORIDE 0.9 % IV SOLN
2.0000 g | INTRAVENOUS | Status: DC
  Administered 2021-08-24 – 2021-08-26 (×3): 2 g via INTRAVENOUS
  Filled 2021-08-24 (×3): qty 20

## 2021-08-24 MED ORDER — IPRATROPIUM-ALBUTEROL 0.5-2.5 (3) MG/3ML IN SOLN: 3.0000 mL | Freq: Four times a day (QID) | RESPIRATORY_TRACT | Status: DC

## 2021-08-24 MED ORDER — POTASSIUM CHLORIDE IN NACL 20-0.9 MEQ/L-% IV SOLN: INTRAVENOUS | Status: AC

## 2021-08-24 MED ORDER — TIOTROPIUM BROMIDE MONOHYDRATE 18 MCG IN CAPS: 18.0000 ug | ORAL_CAPSULE | Freq: Every day | RESPIRATORY_TRACT | Status: DC

## 2021-08-24 MED ORDER — POLYETHYLENE GLYCOL 3350 17 G PO PACK
17.0000 g | PACK | Freq: Two times a day (BID) | ORAL | Status: DC
  Administered 2021-08-24 – 2021-08-26 (×4): 17 g via ORAL
  Filled 2021-08-24 (×5): qty 1

## 2021-08-24 MED ORDER — INSULIN ASPART 100 UNIT/ML IJ SOLN: 0.0000 [IU] | Freq: Every day | INTRAMUSCULAR | Status: DC

## 2021-08-24 MED ORDER — ATORVASTATIN CALCIUM 10 MG PO TABS
20.0000 mg | ORAL_TABLET | Freq: Every day | ORAL | Status: DC
  Administered 2021-08-24 – 2021-08-26 (×3): 20 mg via ORAL
  Filled 2021-08-24 (×3): qty 2

## 2021-08-24 MED ORDER — IPRATROPIUM-ALBUTEROL 0.5-2.5 (3) MG/3ML IN SOLN
3.0000 mL | Freq: Four times a day (QID) | RESPIRATORY_TRACT | Status: DC
  Administered 2021-08-24 – 2021-08-26 (×8): 3 mL via RESPIRATORY_TRACT
  Filled 2021-08-24 (×8): qty 3

## 2021-08-24 MED ORDER — BUDESONIDE 0.5 MG/2ML IN SUSP
0.5000 mg | Freq: Two times a day (BID) | RESPIRATORY_TRACT | Status: DC
  Administered 2021-08-24 – 2021-08-26 (×5): 0.5 mg via RESPIRATORY_TRACT
  Filled 2021-08-24 (×5): qty 2

## 2021-08-24 MED ORDER — BUSPIRONE HCL 5 MG PO TABS
10.0000 mg | ORAL_TABLET | Freq: Three times a day (TID) | ORAL | Status: DC
  Administered 2021-08-24 – 2021-08-26 (×7): 10 mg via ORAL
  Filled 2021-08-24 (×7): qty 2

## 2021-08-24 MED ORDER — BISACODYL 5 MG PO TBEC
5.0000 mg | DELAYED_RELEASE_TABLET | Freq: Two times a day (BID) | ORAL | Status: DC
  Administered 2021-08-24 – 2021-08-26 (×5): 5 mg via ORAL
  Filled 2021-08-24 (×5): qty 1

## 2021-08-24 MED ORDER — PANCRELIPASE (LIP-PROT-AMYL) 20000-63000 UNITS PO CPEP: 1.0000 | ORAL_CAPSULE | Freq: Three times a day (TID) | ORAL | Status: DC

## 2021-08-24 NOTE — Progress Notes (Signed)
Patient admitted with Dx of sepsis today. VS have been stable.  She is alert oriented. She has chronic pain and has scheduled medication which has mad pain tolerable.  She uses O2 at home as needed. Admitted with 2 L [place].  She ambulates with assistance to BR. She stated she has had multiple falls at home. She has constant mild HA after fall several days ago . CT of head was completed.

## 2021-08-24 NOTE — Evaluation (Signed)
Physical Therapy Evaluation Patient Details Name: [name] MRN: [id number] DOB: 1971-01-16 Today's Date: 08/24/2021  History of Present Illness  [name] is a 51 y.o. female with a history of diabetes, COPD, hypertension, hyperlipidemia.  Patient presents with shoulder pain, shortness of breath with oxygen saturations between 86 and 88% on room air.  She has been having multiple falls and dizzy with productive cough with streaks of blood in it.  Her shortness of breath is worse with activity and improved with rest.  No other palliating or provoking factors.  She does feel like she has had a fever at home  Clinical Impression  Pt states that she has been falling at home, not quite sure why.  Normally uses O2 prn and no assistive device to walk.  Pt able to ambulate without AD, however due to hx of falling therapist recommends pt acquire a walker.  Begin waking three times a day starting with 2 minutes, ( this is the time that the pt was able to walk before feeling dizzy at evaluation.)  Once pt is feeling confident with 2 minute walk increase to three than four and progress to 10 minutes of walking three times a day.  Pt also vocalized she does not drink a lot of water, therapist recommended increasing water intake.  Pt does not need skilled PT at this time.        Recommendations for follow up therapy are one component of a multi-disciplinary discharge planning process, led by the attending physician.  Recommendations may be updated based on patient status, additional functional criteria and insurance authorization.  Follow Up Recommendations No PT follow up    Assistance Recommended at Discharge  none  Patient can return home with the following   No assist    Equipment Recommendations Rolling walker (2 wheels)  Recommendations for Other Services    none   Functional Status Assessment Patient has had a recent decline in their functional status and demonstrates the ability to  make significant improvements in function in a reasonable and predictable amount of time. (falling)     Precautions / Restrictions Precautions Precautions: None Restrictions Weight Bearing Restrictions: No      Mobility  Bed Mobility Overal bed mobility: Independent                  Transfers Overall transfer level: Independent                      Ambulation/Gait Ambulation/Gait assistance: Modified independent (Device/Increase time) (used RW due to pt stating that she had been falling quite a bit.) Gait Distance (Feet): 90 Feet Assistive device: Rolling walker (2 wheels) Gait Pattern/deviations: WFL(Within Functional Limits)       General Gait Details: good balance normal speed no SOB noted but pt was starting to get a little dizzy        Pertinent Vitals/Pain Pain Assessment Pain Assessment: 0-10 Pain Score: 4  Pain Location: back from coughing Pain Descriptors / Indicators: Aching, Sore Pain Intervention(s): Limited activity within patient's tolerance    Home Living Family/patient expects to be discharged to:: Private residence Living Arrangements: Alone Available Help at Discharge: Family Type of Home: House Home Access: Level entry         Home Equipment: Other (comment) Additional Comments: O2 prn    Prior Function Prior Level of Function : Independent/Modified Independent  Extremity/Trunk Assessment        Lower Extremity Assessment Lower Extremity Assessment: Overall WFL for tasks assessed       Communication   Communication: No difficulties  Cognition Arousal/Alertness: Awake/alert   Overall Cognitive Status: Within Functional Limits for tasks assessed                                                 Assessment/Plan    PT Assessment Patient does not need any further PT services                   AM-PAC PT "6 Clicks" Mobility  Outcome Measure Help needed  turning from your back to your side while in a flat bed without using bedrails?: None Help needed moving from lying on your back to sitting on the side of a flat bed without using bedrails?: None Help needed moving to and from a bed to a chair (including a wheelchair)?: None Help needed standing up from a chair using your arms (e.g., wheelchair or bedside chair)?: None Help needed to walk in hospital room?: None Help needed climbing 3-5 steps with a railing? : None 6 Click Score: 24    End of Session Equipment Utilized During Treatment: Gait belt Activity Tolerance: Patient tolerated treatment well Patient left: in chair;with call bell/phone within reach Nurse Communication: Mobility status      Time: 1045-1105 PT Time Calculation (min) (ACUTE ONLY): 20 min   Charges:   PT Evaluation $PT Eval Low Complexity: 1 Low         Rayetta Humphrey, PT CLT [phone]  08/24/2021, 11:07 AM

## 2021-08-24 NOTE — Progress Notes (Signed)
PROGRESS NOTE  [name] ZOX:[id number] DOB: 04-14-1971 DOA: 08/23/2021 PCP: [employer]mond [employer], MD  Brief History:  51 year old female with a history of chronic pancreatitis with chronic upper abdominal pain, bipolar disorder, COPD, tobacco abuse, diabetes mellitus type 2, hypertension, hyperlipidemia, hypothyroidism presenting with right-sided chest pain and shortness of breath.  The patient states that she began developing myalgias and arthralgias on 08/19/2021 associated with right-sided rib pain and right shoulder pain and "just aching all over".  As the week progressed, she began developing generalized weakness and shortness of breath and dyspnea on exertion on 08/21/2021.  She complains of a cough with occasional blood-streaked sputum.  On 08/22/2020, she began developing subjective fevers and chills with nausea.  She has not had any emesis, diarrhea, hematochezia, melena, dysuria.  The patient has been faithful with all her home medications.  Patient also states that she has home oxygen at home but she does not use it as directed. In the emergency department, the patient was afebrile but she was tachycardic and hypotensive with systolic blood pressure down to 88/60.  Oxygen saturation was 88% on room air.  She was placed on 3 L with oxygen saturation 96%.  WBC 17.2, hemoglobin 11.0, platelets 174,000.  LFTs unremarkable.  Serum creatinine was 1.12.  Sodium 135.  Chest x-ray showed patchy right lower lobe>RUL opacities.  Lactic acid peaked at 5.2.  COVID-19 PCR was negative the patient was started on ceftriaxone and azithromycin and IV fluids.  Assessment/Plan: Severe sepsis -Secondary to pneumonia -Check PCT -Lactic acid peaked at 5.2 -Continue IV fluids -Continue azithromycin and ceftriaxone -Follow blood cultures  Lobar pneumonia -Continue empiric azithromycin and ceftriaxone  Acute on chronic respiratory failure with hypoxia -The patient is supposedly on chronically 2 L  but does not use it -Presented with saturation 88% on room air -Stable on 3 L currently  COPD exacerbation -Appears to be mild -Start Pulmicort -Continue duo nebs  Chronic pancreatitis/chronic abdominal pain -Continue Creon -Follow[facility] Dr[facility] Steward [facility] at [facility]  Bipolar disorder -Continue Elavil, Celexa, buspirone, gabapentin  Chronic pain syndrome -PDMP reviewed -Patient receives monthly oxycodone 15 mg, #180 -Continue Robaxin and gabapentin  Diabetes mellitus type 2 -Holding metformin -Hemoglobin A1c -NovoLog sliding scale  Hypothyroidism -Continue Synthroid  GERD -Continue pantoprazole  Hyperlipidemia -Continue statin             Family Communication:   no Family at bedside  Consultants:  none  Code Status:  FULL  DVT Prophylaxis:  SCDs   Procedures: As Listed in Progress Note Above  Antibiotics: Ceftriaxone 1/29>> Azithro 1/29>>      Subjective: Patient denies fevers, chills, headache,vomiting, diarrhea, dysuria, hematuria, hematochezia, and melena.  She states that she is breathing a little better than yesterday.  She still has a nonproductive cough.   Objective: Vitals:   08/24/21 0430 08/24/21 0500 08/24/21 0530 08/24/21 0600  BP: (!) 102/55 95/74 138/74 107/62  Pulse: 90 96 99 91  Resp: 13 13 13  (!) 9  Temp:      TempSrc:      SpO2: 94% 95% 92% 90%  Weight:      Height:        Intake/Output Summary (Last 24 hours) at 08/24/2021 0736 Last data filed at 08/24/2021 0715 Gross per 24 hour  Intake 1697.5 ml  Output --  Net 1697.5 ml   Weight change:  Exam:  General:  Pt is alert, follows commands appropriately, not in acute  distress HEENT: No icterus, No thrush, No neck mass, [place]/AT Cardiovascular: RRR, S1/S2, no rubs, no gallops Respiratory: CTA bilaterally, no wheezing, no crackles, no rhonchi Abdomen: Soft/+BS, non tender, non distended, no guarding Extremities: No edema, No lymphangitis, No petechiae, No rashes, no  synovitis   Data Reviewed: I have personally reviewed following labs and imaging studies Basic Metabolic Panel: Recent Labs  Lab 08/23/21 1640 08/24/21 0402  NA 135 138  K 3.6 3.4*  CL 100 105  CO2 23 26  GLUCOSE 174* 163*  BUN 24* 17  CREATININE 1.12* 0.68  CALCIUM 8.3* 8.0*   Liver Function Tests: Recent Labs  Lab 08/23/21 1640  AST 25  ALT 14  ALKPHOS 76  BILITOT 0.8  PROT 6.0*  ALBUMIN 2.9*   Recent Labs  Lab 08/23/21 1640  LIPASE 19   No results for input(s): AMMONIA in the last 168 hours. Coagulation Profile: Recent Labs  Lab 08/23/21 1640  INR 1.0   CBC: Recent Labs  Lab 08/23/21 1640 08/24/21 0402  WBC 17.2* 14.3*  NEUTROABS 14.9*  --   HGB 11.0* 10.0*  HCT 35.2* 32.8*  MCV 90.7 89.6  PLT 174 170   Cardiac Enzymes: No results for input(s): CKTOTAL, CKMB, CKMBINDEX, TROPONINI in the last 168 hours. BNP: Invalid input(s): POCBNP CBG: No results for input(s): GLUCAP in the last 168 hours. HbA1C: No results for input(s): HGBA1C in the last 72 hours. Urine analysis:    Component Value Date/Time   COLORURINE YELLOW 08/23/2021 1906   APPEARANCEUR CLEAR 08/23/2021 1906   LABSPEC 1.010 08/23/2021 1906   PHURINE 5.5 08/23/2021 1906   GLUCOSEU NEGATIVE 08/23/2021 1906   HGBUR NEGATIVE 08/23/2021 1906   BILIRUBINUR NEGATIVE 08/23/2021 1906   BILIRUBINUR negative 10/09/2019 1342   KETONESUR NEGATIVE 08/23/2021 1906   PROTEINUR NEGATIVE 08/23/2021 1906   UROBILINOGEN 0.2 10/09/2019 1342   UROBILINOGEN 0.2 12/06/2014 1819   NITRITE NEGATIVE 08/23/2021 1906   LEUKOCYTESUR NEGATIVE 08/23/2021 1906   Sepsis Labs: @LABRCNTIP (procalcitonin:4,lacticidven:4) ) Recent Results (from the past 240 hour(s))  Blood Culture (routine x 2)     Status: None (Preliminary result)   Collection Time: 08/23/21  4:40 PM   Specimen: Blood  Result Value Ref Range Status   Specimen Description   Final    RIGHT ANTECUBITAL BOTTLES DRAWN AEROBIC AND ANAEROBIC    Special Requests   Final    Blood Culture results may not be optimal due to an excessive volume of blood received in culture bottles Performed at [facility], [address], [place], [place] [address]    Culture PENDING  Incomplete   Report Status PENDING  Incomplete  Blood Culture (routine x 2)     Status: None (Preliminary result)   Collection Time: 08/23/21  5:16 PM   Specimen: Blood  Result Value Ref Range Status   Specimen Description   Final    LEFT ANTECUBITAL BOTTLES DRAWN AEROBIC AND ANAEROBIC   Special Requests   Final    Blood Culture adequate volume Performed at [facility], [address] [place],  [address]    Culture PENDING  Incomplete   Report Status PENDING  Incomplete  Resp Panel by RT-PCR (Flu A&B, Covid) Nasopharyngeal Swab     Status: None   Collection Time: 08/23/21  7:01 PM   Specimen: Nasopharyngeal Swab; Nasopharyngeal(NP) swabs in vial transport medium  Result Value Ref Range Status   SARS Coronavirus 2 by RT PCR NEGATIVE NEGATIVE Final    Comment: (NOTE) SARS-CoV-2 target  nucleic acids are NOT DETECTED.  The SARS-CoV-2 RNA is generally detectable in upper respiratory specimens during the acute phase of infection. The lowest concentration of SARS-CoV-2 viral copies this assay can detect is 138 copies/mL. A negative result does not preclude SARS-Cov-2 infection and should not be used as the sole basis for treatment or other patient management decisions. A negative result may occur with  improper specimen collection/handling, submission of specimen other than nasopharyngeal swab, presence of viral mutation(s) within the areas targeted by this assay, and inadequate number of viral copies(<138 copies/mL). A negative result must be combined with clinical observations, patient history, and epidemiological information. The expected result is Negative.  Fact Sheet for Patients:  EntrepreneurPulse.com.au  Fact Sheet for Healthcare  Providers:  IncredibleEmployment.be  This test is no t yet approved or cleared by the [place] FDA and  has been authorized for detection and/or diagnosis of SARS-CoV-2 by FDA under an Emergency Use Authorization (EUA). This EUA will remain  in effect (meaning this test can be used) for the duration of the COVID-19 declaration under Section 564(b)(1) of the Act, 21 U.S.C.section 360bbb-3(b)(1), unless the authorization is terminated  or revoked sooner.       Influenza A by PCR NEGATIVE NEGATIVE Final   Influenza B by PCR NEGATIVE NEGATIVE Final    Comment: (NOTE) The Xpert Xpress SARS-CoV-2/FLU/RSV plus assay is intended as an aid in the diagnosis of influenza from Nasopharyngeal swab specimens and should not be used as a sole basis for treatment. Nasal washings and aspirates are unacceptable for Xpert Xpress SARS-CoV-2/FLU/RSV testing.  Fact Sheet for Patients: EntrepreneurPulse.com.au  Fact Sheet for Healthcare Providers: IncredibleEmployment.be  This test is not yet approved or cleared by the [place] FDA and has been authorized for detection and/or diagnosis of SARS-CoV-2 by FDA under an Emergency Use Authorization (EUA). This EUA will remain in effect (meaning this test can be used) for the duration of the COVID-19 declaration under Section 564(b)(1) of the Act, 21 U.S.C. section 360bbb-3(b)(1), unless the authorization is terminated or revoked.  Performed at [facility], [address], [place], [place] [address]      Scheduled Meds:  amitriptyline  100 mg Oral QHS   atorvastatin  20 mg Oral Daily   bisacodyl  5 mg Oral BID   budesonide (PULMICORT) nebulizer solution  0.5 mg Nebulization BID   busPIRone  10 mg Oral TID   citalopram  40 mg Oral Daily   gabapentin  300 mg Oral TID   insulin aspart  0-20 Units Subcutaneous TID WC   insulin aspart  0-5 Units Subcutaneous QHS   ipratropium-albuterol  3  mL Nebulization Q6H WA   levothyroxine  25 mcg Oral Q0600   lipase/protease/amylase  24,000 Units Oral TID AC   methocarbamol  500 mg Oral TID   oxyCODONE  15 mg Oral QID   pantoprazole  40 mg Oral BID   polyethylene glycol  17 g Oral BID   umeclidinium bromide  1 puff Inhalation Daily   Continuous Infusions:  sodium chloride 100 mL/hr at 08/24/21 0019   azithromycin 500 mg (08/24/21 2633)   cefTRIAXone (ROCEPHIN)  IV Stopped (08/24/21 0715)    Procedures/Studies: DG Chest Port 1 View  Result Date: 08/23/2021 CLINICAL DATA:  pneumonia history EXAM: PORTABLE CHEST 1 VIEW COMPARISON:  May 30, 2021 FINDINGS: The cardiomediastinal silhouette is unchanged in contour. No pleural effusion. No pneumothorax. New patchy airspace opacities throughout the predominately RIGHT lower lung. There are few scattered  airspace opacities in the LEFT lung. Status post cholecystectomy. IMPRESSION: New patchy airspace opacities throughout the majority of the RIGHT lower lung. Findings are concerning for multifocal infection. Followup PA and lateral chest X-ray is recommended in 3-4 weeks following trial of antibiotic therapy to ensure resolution and exclude underlying malignancy. Electronically Signed   By: Valentino Saxon M.D.   On: 08/23/2021 16:23    Orson Eva, DO  [facility]  If 7PM-7AM, please contact night-coverage [email or web address] Password [facility] 08/24/2021, 7:36 AM   LOS: 1 day

## 2021-08-24 NOTE — Progress Notes (Signed)
°  Transition of Care (TOC) Screening Note   Patient Details  Name: [name] Date of Birth: 02-Oct-1970   Transition of Care [facility]) CM/SW Contact:    Iona Beard, [place] Phone Number: 08/24/2021, 11:31 AM    Transition of Care Department [facility]) has reviewed patient and no TOC needs have been identified at this time. We will continue to monitor patient advancement through interdisciplinary progression rounds. If new patient transition needs arise, please place a TOC consult.

## 2021-08-25 ENCOUNTER — Inpatient Hospital Stay (HOSPITAL_COMMUNITY): Payer: Medicare Other

## 2021-08-25 DIAGNOSIS — Z720 Tobacco use: Secondary | ICD-10-CM

## 2021-08-25 DIAGNOSIS — J9621 Acute and chronic respiratory failure with hypoxia: Secondary | ICD-10-CM

## 2021-08-25 DIAGNOSIS — J181 Lobar pneumonia, unspecified organism: Secondary | ICD-10-CM

## 2021-08-25 DIAGNOSIS — J441 Chronic obstructive pulmonary disease with (acute) exacerbation: Secondary | ICD-10-CM | POA: Diagnosis not present

## 2021-08-25 DIAGNOSIS — A419 Sepsis, unspecified organism: Secondary | ICD-10-CM | POA: Diagnosis not present

## 2021-08-25 LAB — GLUCOSE, CAPILLARY
Glucose-Capillary: 114 mg/dL — ABNORMAL HIGH (ref 70–99)
Glucose-Capillary: 183 mg/dL — ABNORMAL HIGH (ref 70–99)
Glucose-Capillary: 98 mg/dL (ref 70–99)
Glucose-Capillary: 133 mg/dL — ABNORMAL HIGH (ref 70–99)

## 2021-08-25 LAB — CBC
HCT: 28.9 % — ABNORMAL LOW (ref 36.0–46.0)
Hemoglobin: 9.1 g/dL — ABNORMAL LOW (ref 12.0–15.0)
MCH: 28.3 pg (ref 26.0–34.0)
MCHC: 31.5 g/dL (ref 30.0–36.0)
MCV: 90 fL (ref 80.0–100.0)
Platelets: 169 10*3/uL (ref 150–400)
RBC: 3.21 MIL/uL — ABNORMAL LOW (ref 3.87–5.11)
RDW: 24 % — ABNORMAL HIGH (ref 11.5–15.5)
WBC: 9.2 10*3/uL (ref 4.0–10.5)
nRBC: 0 % (ref 0.0–0.2)

## 2021-08-25 LAB — URINE CULTURE: Culture: NO GROWTH

## 2021-08-25 LAB — BASIC METABOLIC PANEL
Anion gap: 6 (ref 5–15)
BUN: 10 mg/dL (ref 6–20)
CO2: 25 mmol/L (ref 22–32)
Calcium: 8.2 mg/dL — ABNORMAL LOW (ref 8.9–10.3)
Chloride: 109 mmol/L (ref 98–111)
Creatinine, Ser: 0.54 mg/dL (ref 0.44–1.00)
GFR, Estimated: >60 mL/min (ref >60)
Glucose, Bld: 105 mg/dL — ABNORMAL HIGH (ref 70–99)
Potassium: 3.6 mmol/L (ref 3.5–5.1)
Sodium: 140 mmol/L (ref 135–145)

## 2021-08-25 LAB — STREP PNEUMONIAE URINARY ANTIGEN: Strep Pneumo Urinary Antigen: NEGATIVE

## 2021-08-25 LAB — MAGNESIUM: Magnesium: 1.2 mg/dL — ABNORMAL LOW (ref 1.7–2.4)

## 2021-08-25 MED ORDER — MAGNESIUM SULFATE 2 GM/50ML IV SOLN
2.0000 g | Freq: Once | INTRAVENOUS | Status: AC
  Administered 2021-08-25: 2 g via INTRAVENOUS
  Filled 2021-08-25: qty 50

## 2021-08-25 MED ORDER — IOHEXOL 350 MG/ML SOLN
100.0000 mL | Freq: Once | INTRAVENOUS | Status: AC | PRN
  Administered 2021-08-25: 75 mL via INTRAVENOUS

## 2021-08-25 MED ORDER — ARFORMOTEROL TARTRATE 15 MCG/2ML IN NEBU
15.0000 ug | INHALATION_SOLUTION | Freq: Two times a day (BID) | RESPIRATORY_TRACT | Status: DC
  Administered 2021-08-25 – 2021-08-26 (×2): 15 ug via RESPIRATORY_TRACT
  Filled 2021-08-25 (×2): qty 2

## 2021-08-25 NOTE — Progress Notes (Signed)
PROGRESS NOTE  [name] TIR:[id number] DOB: 02-09-1971 DOA: 08/23/2021 PCP: Redmond School, MD  Brief History:  51 year old female with a history of chronic pancreatitis with chronic upper abdominal pain, bipolar disorder, COPD, tobacco abuse, diabetes mellitus type 2, hypertension, hyperlipidemia, hypothyroidism presenting with right-sided chest pain and shortness of breath.  The patient states that she began developing myalgias and arthralgias on 08/19/2021 associated with right-sided rib pain and right shoulder pain and "just aching all over".  As the week progressed, she began developing generalized weakness and shortness of breath and dyspnea on exertion on 08/21/2021.  She complains of a cough with occasional blood-streaked sputum.  On 08/22/2020, she began developing subjective fevers and chills with nausea.  She has not had any emesis, diarrhea, hematochezia, melena, dysuria.  The patient has been faithful with all her home medications.  Patient also states that she has home oxygen at home but she does not use it as directed. In the emergency department, the patient was afebrile but she was tachycardic and hypotensive with systolic blood pressure down to 88/60.  Oxygen saturation was 88% on room air.  She was placed on 3 L with oxygen saturation 96%.  WBC 17.2, hemoglobin 11.0, platelets 174,000.  LFTs unremarkable.  Serum creatinine was 1.12.  Sodium 135.  Chest x-ray showed patchy right lower lobe>RUL opacities.  Lactic acid peaked at 5.2.  COVID-19 PCR was negative the patient was started on ceftriaxone and azithromycin and IV fluids.   Assessment/Plan: Severe sepsis -Secondary to pneumonia -Check PCT 12.31 -Lactic acid peaked at 5.2 -Continue IV fluids -Continue azithromycin and ceftriaxone -Follow blood cultures--neg to date   Lobar pneumonia -Continue empiric azithromycin and ceftriaxone   Acute on chronic respiratory failure with hypoxia -The patient is  supposedly on chronically 2 L but does not use it -Presented with saturation 88% on room air -Stable on 3 L currently -D-dimer 2.87 -CTA chest   COPD exacerbation -Appears to be mild -Start brovana -continue pulmicort -Continue duo nebs   Chronic pancreatitis/chronic abdominal pain -Continue Creon -Follows Dr[facility] Steward [facility]one at [facility]   Bipolar disorder -Continue Elavil, Celexa, buspirone, gabapentin   Chronic pain syndrome -PDMP reviewed -Patient receives monthly oxycodone 15 mg, #180 -Continue Robaxin and gabapentin   Diabetes mellitus type 2 -Holding metformin -1/29 Hemoglobin A1c 6.5 -NovoLog sliding scale   Hypothyroidism -Continue Synthroid   GERD -Continue pantoprazole   Hyperlipidemia -Continue statin   Fall -prior to admit -pt states she hit head -CT brain--neg -PT eval--no follow up                     Family Communication:   no Family at bedside   Consultants:  none   Code Status:  FULL   DVT Prophylaxis:  SCDs     Procedures: As Listed in Progress Note Above   Antibiotics: Ceftriaxone 1/29>> Azithro 1/29>>      Subjective:  Patient states she is breathing better.  Denies f/c, cp, n/v/d, abd pain.  Still having R-side CP Objective: Vitals:   08/24/21 2001 08/24/21 2107 08/25/21 0421 08/25/21 0715  BP:  100/61 91/62   Pulse:  100 90   Resp:   18   Temp:  98 F (36.7 C) 98.2 F (36.8 C)   TempSrc:  Oral Oral   SpO2: 92% 94% 100% 98%  Weight:      Height:        Intake/Output Summary (Last  24 hours) at 08/25/2021 0927 Last data filed at 08/25/2021 9767 Gross per 24 hour  Intake 2344.11 ml  Output 400 ml  Net 1944.11 ml   Weight change: 3.3 kg Exam:  General:  Pt is alert, follows commands appropriately, not in acute distress HEENT: No icterus, No thrush, No neck mass, [place]/AT Cardiovascular: RRR, S1/S2, no rubs, no gallops Respiratory: bilateral ales R>L Abdomen: Soft/+BS, non tender, non distended, no  guarding Extremities: No edema, No lymphangitis, No petechiae, No rashes, no synovitis   Data Reviewed: I have personally reviewed following labs and imaging studies Basic Metabolic Panel: Recent Labs  Lab 08/23/21 1640 08/24/21 0402 08/25/21 0436  NA 135 138 140  K 3.6 3.4* 3.6  CL 100 105 109  CO2 23 26 25   GLUCOSE 174* 163* 105*  BUN 24* 17 10  CREATININE 1.12* 0.68 0.54  CALCIUM 8.3* 8.0* 8.2*  MG  --   --  1.2*   Liver Function Tests: Recent Labs  Lab 08/23/21 1640  AST 25  ALT 14  ALKPHOS 76  BILITOT 0.8  PROT 6.0*  ALBUMIN 2.9*   Recent Labs  Lab 08/23/21 1640  LIPASE 19   No results for input(s): AMMONIA in the last 168 hours. Coagulation Profile: Recent Labs  Lab 08/23/21 1640  INR 1.0   CBC: Recent Labs  Lab 08/23/21 1640 08/24/21 0402 08/25/21 0436  WBC 17.2* 14.3* 9.2  NEUTROABS 14.9*  --   --   HGB 11.0* 10.0* 9.1*  HCT 35.2* 32.8* 28.9*  MCV 90.7 89.6 90.0  PLT 174 170 169   Cardiac Enzymes: No results for input(s): CKTOTAL, CKMB, CKMBINDEX, TROPONINI in the last 168 hours. BNP: Invalid input(s): POCBNP CBG: Recent Labs  Lab 08/24/21 0751 08/24/21 1227 08/24/21 1655 08/24/21 2012 08/25/21 0740  GLUCAP 154* 208* 84 189* 114*   HbA1C: Recent Labs    08/24/21 0402  HGBA1C 6.5*   Urine analysis:    Component Value Date/Time   COLORURINE YELLOW 08/23/2021 1906   APPEARANCEUR CLEAR 08/23/2021 1906   LABSPEC 1.010 08/23/2021 1906   PHURINE 5.5 08/23/2021 1906   GLUCOSEU NEGATIVE 08/23/2021 1906   HGBUR NEGATIVE 08/23/2021 1906   BILIRUBINUR NEGATIVE 08/23/2021 1906   BILIRUBINUR negative 10/09/2019 1342   KETONESUR NEGATIVE 08/23/2021 1906   PROTEINUR NEGATIVE 08/23/2021 1906   UROBILINOGEN 0.2 10/09/2019 1342   UROBILINOGEN 0.2 12/06/2014 1819   NITRITE NEGATIVE 08/23/2021 1906   LEUKOCYTESUR NEGATIVE 08/23/2021 1906   Sepsis Labs: @LABRCNTIP (procalcitonin:4,lacticidven:4) ) Recent Results (from the past 240  hour(s))  Blood Culture (routine x 2)     Status: None (Preliminary result)   Collection Time: 08/23/21  4:40 PM   Specimen: Right Antecubital; Blood  Result Value Ref Range Status   Specimen Description   Final    RIGHT ANTECUBITAL BOTTLES DRAWN AEROBIC AND ANAEROBIC   Special Requests   Final    Blood Culture results may not be optimal due to an excessive volume of blood received in culture bottles   Culture   Final    NO GROWTH < 24 HOURS Performed at [facility], [address] [place], [place] [address]    Report Status PENDING  Incomplete  Blood Culture (routine x 2)     Status: None (Preliminary result)   Collection Time: 08/23/21  5:16 PM   Specimen: Left Antecubital; Blood  Result Value Ref Range Status   Specimen Description   Final    LEFT ANTECUBITAL BOTTLES DRAWN AEROBIC AND ANAEROBIC  Special Requests Blood Culture adequate volume  Final   Culture   Final    NO GROWTH < 24 HOURS Performed at [facility], [address] [place], [place] [address]    Report Status PENDING  Incomplete  Resp Panel by RT-PCR (Flu A&B, Covid) Nasopharyngeal Swab     Status: None   Collection Time: 08/23/21  7:01 PM   Specimen: Nasopharyngeal Swab; Nasopharyngeal(NP) swabs in vial transport medium  Result Value Ref Range Status   SARS Coronavirus 2 by RT PCR NEGATIVE NEGATIVE Final    Comment: (NOTE) SARS-CoV-2 target nucleic acids are NOT DETECTED.  The SARS-CoV-2 RNA is generally detectable in upper respiratory specimens during the acute phase of infection. The lowest concentration of SARS-CoV-2 viral copies this assay can detect is 138 copies/mL. A negative result does not preclude SARS-Cov-2 infection and should not be used as the sole basis for treatment or other patient management decisions. A negative result may occur with  improper specimen collection/handling, submission of specimen other than nasopharyngeal swab, presence of viral mutation(s) within the areas targeted by  this assay, and inadequate number of viral copies(<138 copies/mL). A negative result must be combined with clinical observations, patient history, and epidemiological information. The expected result is Negative.  Fact Sheet for Patients:  EntrepreneurPulse.com.au  Fact Sheet for Healthcare Providers:  IncredibleEmployment.be  This test is no t yet approved or cleared by the [place] FDA and  has been authorized for detection and/or diagnosis of SARS-CoV-2 by FDA under an Emergency Use Authorization (EUA). This EUA will remain  in effect (meaning this test can be used) for the duration of the COVID-19 declaration under Section 564(b)(1) of the Act, 21 U.S.C.section 360bbb-3(b)(1), unless the authorization is terminated  or revoked sooner.       Influenza A by PCR NEGATIVE NEGATIVE Final   Influenza B by PCR NEGATIVE NEGATIVE Final    Comment: (NOTE) The Xpert Xpress SARS-CoV-2/FLU/RSV plus assay is intended as an aid in the diagnosis of influenza from Nasopharyngeal swab specimens and should not be used as a sole basis for treatment. Nasal washings and aspirates are unacceptable for Xpert Xpress SARS-CoV-2/FLU/RSV testing.  Fact Sheet for Patients: EntrepreneurPulse.com.au  Fact Sheet for Healthcare Providers: IncredibleEmployment.be  This test is not yet approved or cleared by the [place] FDA and has been authorized for detection and/or diagnosis of SARS-CoV-2 by FDA under an Emergency Use Authorization (EUA). This EUA will remain in effect (meaning this test can be used) for the duration of the COVID-19 declaration under Section 564(b)(1) of the Act, 21 U.S.C. section 360bbb-3(b)(1), unless the authorization is terminated or revoked.  Performed at [facility], [address], [place], [place] [address]   Urine Culture     Status: None   Collection Time: 08/23/21  7:06 PM   Specimen:  In/Out Cath Urine  Result Value Ref Range Status   Specimen Description   Final    IN/OUT CATH URINE Performed at [facility], [address] [place], [place] [address]    Special Requests   Final    NONE Performed at [facility], [address], [place], [place] [address]    Culture   Final    NO GROWTH Performed at [facility], [place] [address], [place],  [address]    Report Status 08/25/2021 FINAL  Final     Scheduled Meds:  amitriptyline  100 mg Oral QHS   arformoterol  15 mcg Nebulization BID   atorvastatin  20 mg Oral Daily   bisacodyl  5 mg Oral BID   budesonide (PULMICORT) nebulizer solution  0.5 mg Nebulization BID   busPIRone  10 mg Oral TID   citalopram  40 mg Oral Daily   gabapentin  300 mg Oral TID   insulin aspart  0-9 Units Subcutaneous TID WC   ipratropium-albuterol  3 mL Nebulization Q6H WA   levothyroxine  25 mcg Oral Q0600   lipase/protease/amylase  24,000 Units Oral TID AC   methocarbamol  500 mg Oral TID   oxyCODONE  15 mg Oral QID   pantoprazole  40 mg Oral BID   polyethylene glycol  17 g Oral BID   umeclidinium bromide  1 puff Inhalation Daily   Continuous Infusions:  0.9 % NaCl with KCl 20 mEq / L 75 mL/hr at 08/25/21 0757   azithromycin 500 mg (08/25/21 0223)   cefTRIAXone (ROCEPHIN)  IV 2 g (08/25/21 0432)   magnesium sulfate bolus IVPB      Procedures/Studies: CT HEAD WO CONTRAST (5MM)  Result Date: 08/24/2021 CLINICAL DATA:  Head trauma, minor, normal mental status (Age 79-64y) trauma, headache EXAM: CT HEAD WITHOUT CONTRAST TECHNIQUE: Contiguous axial images were obtained from the base of the skull through the vertex without intravenous contrast. RADIATION DOSE REDUCTION: This exam was performed according to the departmental dose-optimization program which includes automated exposure control, adjustment of the mA and/or kV according to patient size and/or use of iterative reconstruction technique. COMPARISON:  None. FINDINGS:  Brain: No evidence of large-territorial acute infarction. No parenchymal hemorrhage. No mass lesion. No extra-axial collection. No mass effect or midline shift. No hydrocephalus. Basilar cisterns are patent. Vascular: No hyperdense vessel. Skull: No acute fracture or focal lesion. Sinuses/Orbits: Paranasal sinuses and mastoid air cells are clear. The orbits are unremarkable. Other: None. IMPRESSION: No acute intracranial abnormality. Electronically Signed   By: Iven Finn M.D.   On: 08/24/2021 17:11   DG Chest Port 1 View  Result Date: 08/23/2021 CLINICAL DATA:  pneumonia history EXAM: PORTABLE CHEST 1 VIEW COMPARISON:  May 30, 2021 FINDINGS: The cardiomediastinal silhouette is unchanged in contour. No pleural effusion. No pneumothorax. New patchy airspace opacities throughout the predominately RIGHT lower lung. There are few scattered airspace opacities in the LEFT lung. Status post cholecystectomy. IMPRESSION: New patchy airspace opacities throughout the majority of the RIGHT lower lung. Findings are concerning for multifocal infection. Followup PA and lateral chest X-ray is recommended in 3-4 weeks following trial of antibiotic therapy to ensure resolution and exclude underlying malignancy. Electronically Signed   By: Valentino Saxon M.D.   On: 08/23/2021 16:23    Orson Eva, DO  [facility]  If 7PM-7AM, please contact night-coverage [email or web address] Password [email or web address] 08/25/2021, 9:27 AM   LOS: 2 days

## 2021-08-26 DIAGNOSIS — R6520 Severe sepsis without septic shock: Secondary | ICD-10-CM

## 2021-08-26 LAB — CBC
HCT: 30.6 % — ABNORMAL LOW (ref 36.0–46.0)
Hemoglobin: 9.2 g/dL — ABNORMAL LOW (ref 12.0–15.0)
MCH: 26.5 pg (ref 26.0–34.0)
MCHC: 30.1 g/dL (ref 30.0–36.0)
MCV: 88.2 fL (ref 80.0–100.0)
Platelets: 200 10*3/uL (ref 150–400)
RBC: 3.47 MIL/uL — ABNORMAL LOW (ref 3.87–5.11)
RDW: 23.5 % — ABNORMAL HIGH (ref 11.5–15.5)
WBC: 8.4 10*3/uL (ref 4.0–10.5)
nRBC: 0 % (ref 0.0–0.2)

## 2021-08-26 LAB — BASIC METABOLIC PANEL
Anion gap: 8 (ref 5–15)
BUN: 7 mg/dL (ref 6–20)
CO2: 27 mmol/L (ref 22–32)
Calcium: 8.6 mg/dL — ABNORMAL LOW (ref 8.9–10.3)
Chloride: 105 mmol/L (ref 98–111)
Creatinine, Ser: 0.54 mg/dL (ref 0.44–1.00)
GFR, Estimated: >60 mL/min (ref >60)
Glucose, Bld: 112 mg/dL — ABNORMAL HIGH (ref 70–99)
Potassium: 3.7 mmol/L (ref 3.5–5.1)
Sodium: 140 mmol/L (ref 135–145)

## 2021-08-26 LAB — GLUCOSE, CAPILLARY
Glucose-Capillary: 112 mg/dL — ABNORMAL HIGH (ref 70–99)
Glucose-Capillary: 237 mg/dL — ABNORMAL HIGH (ref 70–99)

## 2021-08-26 LAB — MAGNESIUM: Magnesium: 1.3 mg/dL — ABNORMAL LOW (ref 1.7–2.4)

## 2021-08-26 LAB — LEGIONELLA PNEUMOPHILA SEROGP 1 UR AG: L. pneumophila Serogp 1 Ur Ag: NEGATIVE

## 2021-08-26 MED ORDER — AZITHROMYCIN 500 MG PO TABS: 500.0000 mg | ORAL_TABLET | Freq: Every day | ORAL | 0 refills | Status: DC

## 2021-08-26 MED ORDER — CEFDINIR 300 MG PO CAPS: 300.0000 mg | ORAL_CAPSULE | Freq: Two times a day (BID) | ORAL | 0 refills | Status: DC

## 2021-08-26 MED ORDER — MAGNESIUM OXIDE -MG SUPPLEMENT 400 (240 MG) MG PO TABS
400.0000 mg | ORAL_TABLET | Freq: Two times a day (BID) | ORAL | Status: DC
  Administered 2021-08-26: 400 mg via ORAL
  Filled 2021-08-26: qty 1

## 2021-08-26 MED ORDER — FUROSEMIDE 10 MG/ML IJ SOLN
40.0000 mg | Freq: Once | INTRAMUSCULAR | Status: AC
  Administered 2021-08-26: 40 mg via INTRAVENOUS
  Filled 2021-08-26: qty 4

## 2021-08-26 MED ORDER — MAGNESIUM OXIDE -MG SUPPLEMENT 400 (240 MG) MG PO TABS: 400.0000 mg | ORAL_TABLET | Freq: Two times a day (BID) | ORAL | 0 refills | Status: DC

## 2021-08-26 MED ORDER — AZITHROMYCIN 250 MG PO TABS: 500.0000 mg | ORAL_TABLET | Freq: Every day | ORAL | Status: DC

## 2021-08-26 MED ORDER — CEFDINIR 300 MG PO CAPS
300.0000 mg | ORAL_CAPSULE | Freq: Two times a day (BID) | ORAL | Status: DC
  Administered 2021-08-26: 300 mg via ORAL
  Filled 2021-08-26: qty 1

## 2021-08-26 MED ORDER — MAGNESIUM SULFATE 2 GM/50ML IV SOLN
2.0000 g | Freq: Once | INTRAVENOUS | Status: AC
  Administered 2021-08-26: 2 g via INTRAVENOUS
  Filled 2021-08-26: qty 50

## 2021-08-26 MED ORDER — POTASSIUM CHLORIDE CRYS ER 20 MEQ PO TBCR
20.0000 meq | EXTENDED_RELEASE_TABLET | Freq: Once | ORAL | Status: AC
  Administered 2021-08-26: 20 meq via ORAL
  Filled 2021-08-26: qty 1

## 2021-08-26 NOTE — Discharge Summary (Signed)
Physician Discharge Summary  [name] TOI:[id number] DOB: 12-Jan-1971 DOA: 08/23/2021  PCP: Redmond School, MD  Admit date: 08/23/2021 Discharge date: 08/26/2021  Admitted From: Home Disposition:  Home  Recommendations for Outpatient Follow-up:  Follow up with PCP in 1-2 weeks Please obtain BMP/CBC in one week     Discharge Condition: Stable CODE STATUS: FULL Diet recommendation: Heart Healthy    Brief/Interim Summary: 51 year old female with a history of chronic pancreatitis with chronic upper abdominal pain, bipolar disorder, COPD, tobacco abuse, diabetes mellitus type 2, hypertension, hyperlipidemia, hypothyroidism presenting with right-sided chest pain and shortness of breath.  The patient states that she began developing myalgias and arthralgias on 08/19/2021 associated with right-sided rib pain and right shoulder pain and "just aching all over".  As the week progressed, she began developing generalized weakness and shortness of breath and dyspnea on exertion on 08/21/2021.  She complains of a cough with occasional blood-streaked sputum.  On 08/22/2020, she began developing subjective fevers and chills with nausea.  She has not had any emesis, diarrhea, hematochezia, melena, dysuria.  The patient has been faithful with all her home medications.  Patient also states that she has home oxygen at home but she does not use it as directed. In the emergency department, the patient was afebrile but she was tachycardic and hypotensive with systolic blood pressure down to 88/60.  Oxygen saturation was 88% on room air.  She was placed on 3 L with oxygen saturation 96%.  WBC 17.2, hemoglobin 11.0, platelets 174,000.  LFTs unremarkable.  Serum creatinine was 1.12.  Sodium 135.  Chest x-ray showed patchy right lower lobe>RUL opacities.  Lactic acid peaked at 5.2.  COVID-19 PCR was negative the patient was started on ceftriaxone and azithromycin with clinical improvement.    Discharge  Diagnoses:  Severe sepsis -Secondary to pneumonia -Check PCT 12.31 -Lactic acid peaked at 5.2 -Continue IV fluids>>saline locked -Continue azithromycin and ceftriaxone -Follow blood cultures--neg to date -sepsis physiology resolved   Lobar pneumonia -Continue empiric azithromycin and ceftriaxone -d/c home with cefdinir and azithro x 4 more days   Acute on chronic respiratory failure with hypoxia -The patient is supposedly on chronically 2 L but does not use it -Presented with saturation 88% on room air -Stable on 3 L currently>>weaned to RA -D-dimer 2.87 -1/30 CTA chest--neg PE; Extensive, irregular consolidation throughout the lungs; GGO and interlobular septal thickening throughout. Diffuse bilateral bronchial wall thickening -ambulatory pulse ox on RA on day of d/c did not show any oxygen desaturation <93%   COPD exacerbation -Appears to be mild -Started brovana during hospitalization -continue pulmicort -Continue duo nebs -improved   Chronic pancreatitis/chronic abdominal pain -Continue Creon -Follows Dr. Steward Drone at [facility]   Bipolar disorder -Continue Elavil, Celexa, buspirone, gabapentin   Chronic pain syndrome -PDMP reviewed -Patient receives monthly oxycodone 15 mg, #180 -Continue Robaxin and gabapentin   Diabetes mellitus type 2 -Holding metformin -1/29 Hemoglobin A1c 6.5 -NovoLog sliding scale   Hypothyroidism -Continue Synthroid   GERD -Continue pantoprazole   Hyperlipidemia -Continue statin   Fall -prior to admit -pt states she hit head -CT brain--neg -PT eval--no follow up   Discharge Instructions   Allergies as of 08/26/2021       Reactions   Penicillins Shortness Of Breath    Has patient had a PCN reaction causing immediate rash, facial/tongue/throat swelling, SOB or lightheadedness with hypotension: Yes Has patient had a PCN reaction causing severe rash involving mucus membranes or skin necrosis: Yes Has patient had  a PCN  reaction that required hospitalization No Has patient had a PCN reaction occurring within the last 10 years: No  If all of the above answers are "NO", then may proceed with Cephalosporin use. Tolerated ceftriaxone   Sulfa Antibiotics Shortness Of Breath   Aspirin Nausea And Vomiting   Enoxaparin Other (See Comments)   Has been known to cause her blood clots in her legs Has been known to cause her blood clots in her legs   Lovenox  [enoxaparin Sodium] Other (See Comments)   Has been known to cause her blood clots in her legs   Prednisone Other (See Comments)   Pancreatitis, but has to take for asthma sometimes        Medication List     STOP taking these medications    linaclotide 290 MCG Caps capsule Commonly known as: LINZESS       TAKE these medications    acetaminophen 500 MG tablet Commonly known as: TYLENOL Take by mouth.   albuterol 108 (90 Base) MCG/ACT inhaler Commonly known as: VENTOLIN HFA Inhale 2 puffs into the lungs daily as needed for wheezing. For shortness of breath   albuterol (2.5 MG/3ML) 0.083% nebulizer solution Commonly known as: PROVENTIL Take 2.5 mg by nebulization every 6 (six) hours as needed for wheezing or shortness of breath.   amitriptyline 100 MG tablet Commonly known as: ELAVIL Take 100 mg by mouth at bedtime.   amitriptyline 100 MG tablet Commonly known as: ELAVIL Take by mouth.   atorvastatin 20 MG tablet Commonly known as: LIPITOR Take 20 mg by mouth daily.   azithromycin 500 MG tablet Commonly known as: ZITHROMAX Take 1 tablet (500 mg total) by mouth daily. Start taking on: August 27, 2021 What changed:  medication strength how much to take additional instructions   bisacodyl 5 MG EC tablet Commonly known as: DULCOLAX Take 1 tablet (5 mg total) by mouth 2 (two) times daily.   budesonide-formoterol 80-4.5 MCG/ACT inhaler Commonly known as: Symbicort Inhale 2 puffs into the lungs 2 (two) times daily. What changed:  when to take this   busPIRone 10 MG tablet Commonly known as: BUSPAR Take 10 mg by mouth 3 (three) times daily.   cefdinir 300 MG capsule Commonly known as: OMNICEF Take 1 capsule (300 mg total) by mouth every 12 (twelve) hours.   citalopram 40 MG tablet Commonly known as: CELEXA Take 40 mg by mouth daily.   ferrous sulfate 325 (65 FE) MG tablet Take 325 mg by mouth daily.   furosemide 20 MG tablet Commonly known as: LASIX Take 20 mg by mouth daily.   gabapentin 300 MG capsule Commonly known as: NEURONTIN Take 300 mg by mouth 3 (three) times daily.   levothyroxine 25 MCG tablet Commonly known as: SYNTHROID Take 25 mcg by mouth daily before breakfast.   lisinopril 2.5 MG tablet Commonly known as: ZESTRIL Take 2.5 mg by mouth daily.   magnesium oxide 400 (240 Mg) MG tablet Commonly known as: MAG-OX Take 1 tablet (400 mg total) by mouth 2 (two) times daily.   metFORMIN 500 MG tablet Commonly known as: GLUCOPHAGE Take 2 tablets by mouth 2 (two) times daily.   methocarbamol 500 MG tablet Commonly known as: ROBAXIN Take 500 mg by mouth 3 (three) times daily.   mupirocin cream 2 % Commonly known as: BACTROBAN Apply 1 application topically daily. Apply to right 2nd toe and left great toe once daily until healed.   ondansetron 4 MG tablet Commonly known  as: ZOFRAN Take 4 mg by mouth every 8 (eight) hours as needed for vomiting or nausea.   oxyCODONE 15 MG immediate release tablet Commonly known as: ROXICODONE Take 15 mg by mouth 4 (four) times daily.   OXYGEN Inhale 3 L into the lungs daily as needed (for shortness of breath).   pantoprazole 40 MG tablet Commonly known as: PROTONIX Take 40 mg by mouth 2 (two) times daily.   polyethylene glycol powder 17 GM/SCOOP powder Commonly known as: GLYCOLAX/MIRALAX Take 17 g by mouth 2 (two) times daily. Until daily soft stools  OTC   Spiriva HandiHaler 18 MCG inhalation capsule Generic drug: tiotropium Place 18  mcg into inhaler and inhale daily.   valACYclovir 500 MG tablet Commonly known as: VALTREX Take 500 mg by mouth daily.   Zenpep 20000-63000 units Cpep Generic drug: Pancrelipase (Lip-Prot-Amyl) Take 1 capsule by mouth 3 times a day with meals and 1 capsules with snacks.               Durable Medical Equipment  (From admission, onward)           Start     Ordered   08/25/21 0931  For home use only DME Walker rolling  Once       Question Answer Comment  Walker: With 5 Inch Wheels   Patient needs a walker to treat with the following condition Gait instability      08/25/21 0930            Allergies  Allergen Reactions   Penicillins Shortness Of Breath     Has patient had a PCN reaction causing immediate rash, facial/tongue/throat swelling, SOB or lightheadedness with hypotension: Yes Has patient had a PCN reaction causing severe rash involving mucus membranes or skin necrosis: Yes Has patient had a PCN reaction that required hospitalization No Has patient had a PCN reaction occurring within the last 10 years: No  If all of the above answers are "NO", then may proceed with Cephalosporin use. Tolerated ceftriaxone   Sulfa Antibiotics Shortness Of Breath   Aspirin Nausea And Vomiting   Enoxaparin Other (See Comments)    Has been known to cause her blood clots in her legs Has been known to cause her blood clots in her legs   Lovenox  [Enoxaparin Sodium] Other (See Comments)    Has been known to cause her blood clots in her legs   Prednisone Other (See Comments)    Pancreatitis, but has to take for asthma sometimes    Consultations: none   Procedures/Studies: CT HEAD WO CONTRAST (5MM)  Result Date: 08/24/2021 CLINICAL DATA:  Head trauma, minor, normal mental status (Age 68-64y) trauma, headache EXAM: CT HEAD WITHOUT CONTRAST TECHNIQUE: Contiguous axial images were obtained from the base of the skull through the vertex without intravenous contrast. RADIATION  DOSE REDUCTION: This exam was performed according to the departmental dose-optimization program which includes automated exposure control, adjustment of the mA and/or kV according to patient size and/or use of iterative reconstruction technique. COMPARISON:  None. FINDINGS: Brain: No evidence of large-territorial acute infarction. No parenchymal hemorrhage. No mass lesion. No extra-axial collection. No mass effect or midline shift. No hydrocephalus. Basilar cisterns are patent. Vascular: No hyperdense vessel. Skull: No acute fracture or focal lesion. Sinuses/Orbits: Paranasal sinuses and mastoid air cells are clear. The orbits are unremarkable. Other: None. IMPRESSION: No acute intracranial abnormality. Electronically Signed   By: Iven Finn M.D.   On: 08/24/2021 17:11   CT Angio  Chest Pulmonary Embolism (PE) W or WO Contrast  Result Date: 08/25/2021 CLINICAL DATA:  PE suspected, hemoptysis, shortness of breath EXAM: CT ANGIOGRAPHY CHEST WITH CONTRAST TECHNIQUE: Multidetector CT imaging of the chest was performed using the standard protocol during bolus administration of intravenous contrast. Multiplanar CT image reconstructions and MIPs were obtained to evaluate the vascular anatomy. RADIATION DOSE REDUCTION: This exam was performed according to the departmental dose-optimization program which includes automated exposure control, adjustment of the mA and/or kV according to patient size and/or use of iterative reconstruction technique. CONTRAST:  77mL OMNIPAQUE IOHEXOL 350 MG/ML SOLN COMPARISON:  06/11/2016 FINDINGS: Cardiovascular: Satisfactory opacification of the pulmonary arteries to the segmental level. No evidence of pulmonary embolism. Normal heart size. No pericardial effusion. Aortic atherosclerosis. Mediastinum/Nodes: No enlarged mediastinal, hilar, or axillary lymph nodes. Moderate hiatal hernia with intrathoracic position of the gastric fundus. Thyroid gland, trachea, and esophagus demonstrate  no significant findings. Lungs/Pleura: Small right, trace left pleural effusions. Diffuse bilateral bronchial wall thickening. Extensive, irregular consolidation throughout the lungs, particularly in the right lung base, with additional extensive ground-glass opacity and interlobular septal thickening throughout. Upper Abdomen: No acute abnormality. Musculoskeletal: No chest wall abnormality. No acute osseous findings. Review of the MIP images confirms the above findings. IMPRESSION: 1. Negative examination for pulmonary embolism. 2. Extensive, irregular consolidation throughout the lungs, particularly in the right lung base, with additional extensive ground-glass opacity and interlobular septal thickening throughout. Diffuse bilateral bronchial wall thickening. Findings are consistent with multifocal infection, possibly with a component of pulmonary edema. 3. Small right, trace left pleural effusions. 4. Moderate hiatal hernia with intrathoracic position of the gastric fundus. Aortic Atherosclerosis (ICD10-I70.0). Electronically Signed   By: Delanna Ahmadi M.D.   On: 08/25/2021 10:51   DG Chest Port 1 View  Result Date: 08/23/2021 CLINICAL DATA:  pneumonia history EXAM: PORTABLE CHEST 1 VIEW COMPARISON:  May 30, 2021 FINDINGS: The cardiomediastinal silhouette is unchanged in contour. No pleural effusion. No pneumothorax. New patchy airspace opacities throughout the predominately RIGHT lower lung. There are few scattered airspace opacities in the LEFT lung. Status post cholecystectomy. IMPRESSION: New patchy airspace opacities throughout the majority of the RIGHT lower lung. Findings are concerning for multifocal infection. Followup PA and lateral chest X-ray is recommended in 3-4 weeks following trial of antibiotic therapy to ensure resolution and exclude underlying malignancy. Electronically Signed   By: Valentino Saxon M.D.   On: 08/23/2021 16:23        Discharge Exam: Vitals:   08/26/21 0446  08/26/21 0712  BP: 111/63   Pulse: 91   Resp: 15   Temp: 98.3 F (36.8 C)   SpO2: 100% 100%   Vitals:   08/26/21 0000 08/26/21 0228 08/26/21 0446 08/26/21 0712  BP:   111/63   Pulse:   91   Resp:   15   Temp:   98.3 F (36.8 C)   TempSrc:   Oral   SpO2: 92% 92% 100% 100%  Weight:      Height:        General: Pt is alert, awake, not in acute distress Cardiovascular: RRR, S1/S2 +, no rubs, no gallops Respiratory: bibasilar rales. No wheeze Abdominal: Soft, epigastric tender, no guarding, ND, bowel sounds + Extremities: no edema, no cyanosis   The results of significant diagnostics from this hospitalization (including imaging, microbiology, ancillary and laboratory) are listed below for reference.    Significant Diagnostic Studies: CT HEAD WO CONTRAST (5MM)  Result Date: 08/24/2021 CLINICAL DATA:  Head  trauma, minor, normal mental status (Age 42-64y) trauma, headache EXAM: CT HEAD WITHOUT CONTRAST TECHNIQUE: Contiguous axial images were obtained from the base of the skull through the vertex without intravenous contrast. RADIATION DOSE REDUCTION: This exam was performed according to the departmental dose-optimization program which includes automated exposure control, adjustment of the mA and/or kV according to patient size and/or use of iterative reconstruction technique. COMPARISON:  None. FINDINGS: Brain: No evidence of large-territorial acute infarction. No parenchymal hemorrhage. No mass lesion. No extra-axial collection. No mass effect or midline shift. No hydrocephalus. Basilar cisterns are patent. Vascular: No hyperdense vessel. Skull: No acute fracture or focal lesion. Sinuses/Orbits: Paranasal sinuses and mastoid air cells are clear. The orbits are unremarkable. Other: None. IMPRESSION: No acute intracranial abnormality. Electronically Signed   By: Iven Finn M.D.   On: 08/24/2021 17:11   CT Angio Chest Pulmonary Embolism (PE) W or WO Contrast  Result Date:  08/25/2021 CLINICAL DATA:  PE suspected, hemoptysis, shortness of breath EXAM: CT ANGIOGRAPHY CHEST WITH CONTRAST TECHNIQUE: Multidetector CT imaging of the chest was performed using the standard protocol during bolus administration of intravenous contrast. Multiplanar CT image reconstructions and MIPs were obtained to evaluate the vascular anatomy. RADIATION DOSE REDUCTION: This exam was performed according to the departmental dose-optimization program which includes automated exposure control, adjustment of the mA and/or kV according to patient size and/or use of iterative reconstruction technique. CONTRAST:  76mL OMNIPAQUE IOHEXOL 350 MG/ML SOLN COMPARISON:  06/11/2016 FINDINGS: Cardiovascular: Satisfactory opacification of the pulmonary arteries to the segmental level. No evidence of pulmonary embolism. Normal heart size. No pericardial effusion. Aortic atherosclerosis. Mediastinum/Nodes: No enlarged mediastinal, hilar, or axillary lymph nodes. Moderate hiatal hernia with intrathoracic position of the gastric fundus. Thyroid gland, trachea, and esophagus demonstrate no significant findings. Lungs/Pleura: Small right, trace left pleural effusions. Diffuse bilateral bronchial wall thickening. Extensive, irregular consolidation throughout the lungs, particularly in the right lung base, with additional extensive ground-glass opacity and interlobular septal thickening throughout. Upper Abdomen: No acute abnormality. Musculoskeletal: No chest wall abnormality. No acute osseous findings. Review of the MIP images confirms the above findings. IMPRESSION: 1. Negative examination for pulmonary embolism. 2. Extensive, irregular consolidation throughout the lungs, particularly in the right lung base, with additional extensive ground-glass opacity and interlobular septal thickening throughout. Diffuse bilateral bronchial wall thickening. Findings are consistent with multifocal infection, possibly with a component of pulmonary  edema. 3. Small right, trace left pleural effusions. 4. Moderate hiatal hernia with intrathoracic position of the gastric fundus. Aortic Atherosclerosis (ICD10-I70.0). Electronically Signed   By: Delanna Ahmadi M.D.   On: 08/25/2021 10:51   DG Chest Port 1 View  Result Date: 08/23/2021 CLINICAL DATA:  pneumonia history EXAM: PORTABLE CHEST 1 VIEW COMPARISON:  May 30, 2021 FINDINGS: The cardiomediastinal silhouette is unchanged in contour. No pleural effusion. No pneumothorax. New patchy airspace opacities throughout the predominately RIGHT lower lung. There are few scattered airspace opacities in the LEFT lung. Status post cholecystectomy. IMPRESSION: New patchy airspace opacities throughout the majority of the RIGHT lower lung. Findings are concerning for multifocal infection. Followup PA and lateral chest X-ray is recommended in 3-4 weeks following trial of antibiotic therapy to ensure resolution and exclude underlying malignancy. Electronically Signed   By: Valentino Saxon M.D.   On: 08/23/2021 16:23    Microbiology: Recent Results (from the past 240 hour(s))  Blood Culture (routine x 2)     Status: None (Preliminary result)   Collection Time: 08/23/21  4:40 PM   Specimen:  Right Antecubital; Blood  Result Value Ref Range Status   Specimen Description   Final    RIGHT ANTECUBITAL BOTTLES DRAWN AEROBIC AND ANAEROBIC   Special Requests   Final    Blood Culture results may not be optimal due to an excessive volume of blood received in culture bottles   Culture   Final    NO GROWTH 3 DAYS Performed at [facility], [address], [place], [place] [address]    Report Status PENDING  Incomplete  Blood Culture (routine x 2)     Status: None (Preliminary result)   Collection Time: 08/23/21  5:16 PM   Specimen: Left Antecubital; Blood  Result Value Ref Range Status   Specimen Description   Final    LEFT ANTECUBITAL BOTTLES DRAWN AEROBIC AND ANAEROBIC   Special Requests Blood Culture  adequate volume  Final   Culture   Final    NO GROWTH 3 DAYS Performed at [facility], [address], [place], [place] [address]    Report Status PENDING  Incomplete  Resp Panel by RT-PCR (Flu A&B, Covid) Nasopharyngeal Swab     Status: None   Collection Time: 08/23/21  7:01 PM   Specimen: Nasopharyngeal Swab; Nasopharyngeal(NP) swabs in vial transport medium  Result Value Ref Range Status   SARS Coronavirus 2 by RT PCR NEGATIVE NEGATIVE Final    Comment: (NOTE) SARS-CoV-2 target nucleic acids are NOT DETECTED.  The SARS-CoV-2 RNA is generally detectable in upper respiratory specimens during the acute phase of infection. The lowest concentration of SARS-CoV-2 viral copies this assay can detect is 138 copies/mL. A negative result does not preclude SARS-Cov-2 infection and should not be used as the sole basis for treatment or other patient management decisions. A negative result may occur with  improper specimen collection/handling, submission of specimen other than nasopharyngeal swab, presence of viral mutation(s) within the areas targeted by this assay, and inadequate number of viral copies(<138 copies/mL). A negative result must be combined with clinical observations, patient history, and epidemiological information. The expected result is Negative.  Fact Sheet for Patients:  EntrepreneurPulse.com.au  Fact Sheet for Healthcare Providers:  IncredibleEmployment.be  This test is no t yet approved or cleared by the [place] FDA and  has been authorized for detection and/or diagnosis of SARS-CoV-2 by FDA under an Emergency Use Authorization (EUA). This EUA will remain  in effect (meaning this test can be used) for the duration of the COVID-19 declaration under Section 564(b)(1) of the Act, 21 U.S.C.section 360bbb-3(b)(1), unless the authorization is terminated  or revoked sooner.       Influenza A by PCR NEGATIVE NEGATIVE Final    Influenza B by PCR NEGATIVE NEGATIVE Final    Comment: (NOTE) The Xpert Xpress SARS-CoV-2/FLU/RSV plus assay is intended as an aid in the diagnosis of influenza from Nasopharyngeal swab specimens and should not be used as a sole basis for treatment. Nasal washings and aspirates are unacceptable for Xpert Xpress SARS-CoV-2/FLU/RSV testing.  Fact Sheet for Patients: EntrepreneurPulse.com.au  Fact Sheet for Healthcare Providers: IncredibleEmployment.be  This test is not yet approved or cleared by the [place] FDA and has been authorized for detection and/or diagnosis of SARS-CoV-2 by FDA under an Emergency Use Authorization (EUA). This EUA will remain in effect (meaning this test can be used) for the duration of the COVID-19 declaration under Section 564(b)(1) of the Act, 21 U.S.C. section 360bbb-3(b)(1), unless the authorization is terminated or revoked.  Performed at [facility], [address]  [place], [place] [address]   Urine Culture     Status: None   Collection Time: 08/23/21  7:06 PM   Specimen: In/Out Cath Urine  Result Value Ref Range Status   Specimen Description   Final    IN/OUT CATH URINE Performed at [facility], [address] [place], [place] [address]    Special Requests   Final    NONE Performed at [facility], [address], [place], [place] [address]    Culture   Final    NO GROWTH Performed at [facility], [place] [address] [place], [place] [address]    Report Status 08/25/2021 FINAL  Final     Labs: Basic Metabolic Panel: Recent Labs  Lab 08/23/21 1640 08/24/21 0402 08/25/21 0436 08/26/21 0536  NA 135 138 140 140  K 3.6 3.4* 3.6 3.7  CL 100 105 109 105  CO2 23 26 25 27   GLUCOSE 174* 163* 105* 112*  BUN 24* 17 10 7   CREATININE 1.12* 0.68 0.54 0.54  CALCIUM 8.3* 8.0* 8.2* 8.6*  MG  --   --  1.2* 1.3*   Liver Function Tests: Recent Labs  Lab 08/23/21 1640  AST 25  ALT 14  ALKPHOS 76   BILITOT 0.8  PROT 6.0*  ALBUMIN 2.9*   Recent Labs  Lab 08/23/21 1640  LIPASE 19   No results for input(s): AMMONIA in the last 168 hours. CBC: Recent Labs  Lab 08/23/21 1640 08/24/21 0402 08/25/21 0436 08/26/21 0536  WBC 17.2* 14.3* 9.2 8.4  NEUTROABS 14.9*  --   --   --   HGB 11.0* 10.0* 9.1* 9.2*  HCT 35.2* 32.8* 28.9* 30.6*  MCV 90.7 89.6 90.0 88.2  PLT 174 170 169 200   Cardiac Enzymes: No results for input(s): CKTOTAL, CKMB, CKMBINDEX, TROPONINI in the last 168 hours. BNP: Invalid input(s): POCBNP CBG: Recent Labs  Lab 08/25/21 1120 08/25/21 1625 08/25/21 2136 08/26/21 0755 08/26/21 1134  GLUCAP 183* 98 133* 112* 237*    Time coordinating discharge:  36 minutes  Signed:  Orson Eva, DO [facility] Pager: [phone] 08/26/2021, 5:19 PM

## 2021-08-28 LAB — CULTURE, BLOOD (ROUTINE X 2)
Culture: NO GROWTH
Culture: NO GROWTH
Special Requests: ADEQUATE

## 2022-01-28 NOTE — Progress Notes (Unsigned)
[facility] [address], [place] [address]   CLINIC:  Medical Oncology/Hematology  CONSULT NOTE  Patient Care Team: Redmond School, MD as PCP - General (Internal Medicine) Gala Romney Cristopher Estimable, MD as Attending Physician (Gastroenterology) Verner Chol, MD as Consulting Physician (Sports Medicine)  CHIEF COMPLAINTS/PURPOSE OF CONSULTATION:  Normocytic anemia  HISTORY OF PRESENTING ILLNESS:  [name] 51 y.o. female is here at the request of Delman Cheadle, PA-C/Lawrence Gerarda Fraction MD [facility]) for evaluation of normocytic anemia.  Labs sent by PCP (12/25/2021) showed Hgb 9.9/MCV 84 with normal platelets 335, normal WBC 8.3.  She has normal kidney function with creatinine 0.80 and EGFR 88. Additional lab review per EMR shows a longstanding history of intermittent anemia since at least 2009, with Hgb ranging from normal to as low as 6.4.  Previous labs (2020) have also shown iron deficiency. *** Blood transfusion *** IV iron?  She has history of GI bleeding from erosive esophagitis as well as small bowel AVMs.  She was hospitalized in October 2020 for acute blood loss anemia, received 2 units PRBC at that time.  EGD (05/26/2019) showed LA grade C esophagitis with bleeding and signs of gastritis.  Capsule endoscopy showed bright red blood and AVMs in duodenum/jejunum.  Push enteroscopy and colonoscopy performed at [facility] on 06/02/2019, both reportedly normal with scar tissue noted in duodenal bulb likely from prior duodenal ulcer. *** GI follow-up??  *** She follows with Dr. Steward Drone of [facility] gastroenterology.  Most recent EGD performed at [facility] showed Barrett's esophagus and hiatal hernia, but no signs of bleeding.  *** Bleeding (incl. periods) *** Fatigue *** Pica, RLS, headaches *** CP, DOE, palpitations, LH, syncope  *** Vegetarian diet? *** Iron supplement? *** PPI, blood thinner, NSAID *** History  of  PMH: Chronic pain syndrome, hyperlipidemia, hypertension, hypothyroidism, COPD, diabetes, chronic pancreatitis, IBS, sleep apnea ***  SOCIAL: ***.  She is a former smoker ***.  FAMILY: Family history of coronary artery disease, hypertension, and diabetes.  Maternal grandfather had throat cancer.  ***  MEDICAL HISTORY:  Past Medical History:  Diagnosis Date   Anemia    Anxiety    Asthma    Bipolar 1 disorder (HCC)    Chronic abdominal pain    COPD (chronic obstructive pulmonary disease) (HCC)    Depression    Diabetes mellitus    Diabetic neuropathy ([place]) 10/29/2017   Esophagitis    Hiatal hernia    Hyperlipidemia 02/03/2012   Migraine    Neck pain 06/08/2012   Pancreatitis chronic Idiopathic   Treated by Dr. Newman Pies at [facility] in the past with celiac blocks.    SURGICAL HISTORY: Past Surgical History:  Procedure Laterality Date   ABDOMINAL HYSTERECTOMY     attempted colonoscopy  02/2017   Dr. Newman Pies: Poor prep, scope passed to mid transverse colon before colonoscopy aborted.  No significant findings noted to mid transverse colon.  Next colonoscopy in 2 years.   CELIAC PLEXUS BLOCK     CHOLECYSTECTOMY     COLONOSCOPY N/A 12/05/2012   JWJ:XBJYNW rectum, colon and terminal ileum   ESOPHAGOGASTRODUODENOSCOPY  02/20/2008   GNF:AOZHYQMVH distal esophageal mucosa, suspicious for neoplasm/Hiatal hernia otherwise normal    ESOPHAGOGASTRODUODENOSCOPY  04/03/2008   QIO:[id number] hiatal hernia, otherwise normal/Short, tight, benign-appearing peptic stricture   ESOPHAGOGASTRODUODENOSCOPY  November 16, 2012   Dr. Britta Mccreedy: hiatal hernia, esophagitis,    ESOPHAGOGASTRODUODENOSCOPY (EGD) WITH PROPOFOL N/A 05/09/2019   Dr.  Fields: Large hiatal hernia with erythema and edema in the pouch, mild gastritis.  No biopsies taken.   ESOPHAGOGASTRODUODENOSCOPY (EGD) WITH PROPOFOL N/A 05/26/2019   Procedure: ESOPHAGOGASTRODUODENOSCOPY (EGD) WITH PROPOFOL;  Surgeon: Danie Binder, MD;   [facility];  Service: Endoscopy;  Laterality: N/A;   EUS  12/2018   Dr. Newman Pies: LA grade B esophagitis, antritis but negative H. pylori, 1 cm ulcer at the duodenal sweep   GIVENS CAPSULE STUDY N/A 12/05/2012   Few superficial erosions but nothing found to explain iron deficiency anemia.    GIVENS CAPSULE STUDY N/A 05/26/2019   Procedure: GIVENS CAPSULE STUDY;  Surgeon: Danie Binder, MD;  Location: [facility];  Service: Endoscopy;  Laterality: N/A;   Ileocolonoscopy  06/05/2008     FAO:ZHYQMVH anal canal, otherwise normal rectum, colon   TONSILLECTOMY      SOCIAL HISTORY: Social History   Socioeconomic History   Marital status: Divorced    Spouse name: Not on file   Number of children: Not on file   Years of education: Not on file   Highest education level: Some college, no degree  Occupational History   Occupation: disabled  Tobacco Use   Smoking status: Former   Smokeless tobacco: Never  Scientific laboratory technician Use: Never used  Substance and Sexual Activity   Alcohol use: No   Drug use: No   Sexual activity: Yes    Birth control/protection: Surgical  Other Topics Concern   Not on file  Social History Narrative   ** Merged History Encounter **       She is on disability for pancreatitis since 2014.  She was previously working as a Presenter, broadcasting in 2004. She lives with her husband in a two level home.    Social Determinants of Health   Financial Resource Strain: Low Risk  (02/16/2019)   Overall Financial Resource Strain (CARDIA)    Difficulty of Paying Living Expenses: Not hard at all  Food Insecurity: No Food Insecurity (10/16/2019)   Hunger Vital Sign    Worried About Running Out of Food in the Last Year: Never true    Ran Out of Food in the Last Year: Never true  Transportation Needs: No Transportation Needs (10/16/2019)   PRAPARE - Hydrologist (Medical): No    Lack of Transportation (Non-Medical): No  Physical Activity:  Not on file  Stress: Not on file  Social Connections: Not on file  Intimate Partner Violence: Not on file    FAMILY HISTORY: Family History  Problem Relation Age of Onset   Asthma Other    Asthma Paternal Grandfather    Heart attack Paternal Grandfather    Hypertension Mother    Cancer Maternal Grandmother    Diabetes Paternal Grandmother    Coronary artery disease Neg Hx    Colon cancer Neg Hx     ALLERGIES:  is allergic to penicillins, sulfa antibiotics, aspirin, enoxaparin, lovenox  [enoxaparin sodium], and prednisone.  MEDICATIONS:  Current Outpatient Medications  Medication Sig Dispense Refill   acetaminophen (TYLENOL) 500 MG tablet Take by mouth.     albuterol (PROVENTIL HFA;VENTOLIN HFA) 108 (90 BASE) MCG/ACT inhaler Inhale 2 puffs into the lungs daily as needed for wheezing. For shortness of breath     albuterol (PROVENTIL) (2.5 MG/3ML) 0.083% nebulizer solution Take 2.5 mg by nebulization every 6 (six) hours as needed for wheezing or shortness of breath.      amitriptyline (ELAVIL)  100 MG tablet Take 100 mg by mouth at bedtime.     amitriptyline (ELAVIL) 100 MG tablet Take by mouth.     atorvastatin (LIPITOR) 20 MG tablet Take 20 mg by mouth daily.     azithromycin (ZITHROMAX) 500 MG tablet Take 1 tablet (500 mg total) by mouth daily. 4 tablet 0   bisacodyl (DULCOLAX) 5 MG EC tablet Take 1 tablet (5 mg total) by mouth 2 (two) times daily. 14 tablet 0   budesonide-formoterol (SYMBICORT) 80-4.5 MCG/ACT inhaler Inhale 2 puffs into the lungs 2 (two) times daily. (Patient taking differently: Inhale 2 puffs into the lungs daily.) 1 Inhaler 0   busPIRone (BUSPAR) 10 MG tablet Take 10 mg by mouth 3 (three) times daily.     cefdinir (OMNICEF) 300 MG capsule Take 1 capsule (300 mg total) by mouth every 12 (twelve) hours. 8 capsule 0   citalopram (CELEXA) 40 MG tablet Take 40 mg by mouth daily.     ferrous sulfate 325 (65 FE) MG tablet Take 325 mg by mouth daily.     furosemide  (LASIX) 20 MG tablet Take 20 mg by mouth daily.     gabapentin (NEURONTIN) 300 MG capsule Take 300 mg by mouth 3 (three) times daily.      levothyroxine (SYNTHROID, LEVOTHROID) 25 MCG tablet Take 25 mcg by mouth daily before breakfast.     lisinopril (PRINIVIL,ZESTRIL) 2.5 MG tablet Take 2.5 mg by mouth daily.      magnesium oxide (MAG-OX) 400 (240 Mg) MG tablet Take 1 tablet (400 mg total) by mouth 2 (two) times daily. 60 tablet 0   metFORMIN (GLUCOPHAGE) 500 MG tablet Take 2 tablets by mouth 2 (two) times daily.     methocarbamol (ROBAXIN) 500 MG tablet Take 500 mg by mouth 3 (three) times daily.     mupirocin cream (BACTROBAN) 2 % Apply 1 application topically daily. Apply to right 2nd toe and left great toe once daily until healed. 30 g 1   ondansetron (ZOFRAN) 4 MG tablet Take 4 mg by mouth every 8 (eight) hours as needed for vomiting or nausea.     oxyCODONE (ROXICODONE) 15 MG immediate release tablet Take 15 mg by mouth 4 (four) times daily.      OXYGEN Inhale 3 L into the lungs daily as needed (for shortness of breath).     Pancrelipase, Lip-Prot-Amyl, (ZENPEP) 20000-63000 units CPEP Take 1 capsule by mouth 3 times a day with meals and 1 capsules with snacks.     pantoprazole (PROTONIX) 40 MG tablet Take 40 mg by mouth 2 (two) times daily.     polyethylene glycol powder (GLYCOLAX/MIRALAX) powder Take 17 g by mouth 2 (two) times daily. Until daily soft stools  OTC 250 g 0   SPIRIVA HANDIHALER 18 MCG inhalation capsule Place 18 mcg into inhaler and inhale daily.      valACYclovir (VALTREX) 500 MG tablet Take 500 mg by mouth daily.     No current facility-administered medications for this visit.    REVIEW OF SYSTEMS:   Review of Systems - Oncology    PHYSICAL EXAMINATION: ECOG PERFORMANCE STATUS: {CHL ONC ECOG PS:[phone]}  There were no vitals filed for this visit. There were no vitals filed for this visit.  Physical Exam    LABORATORY DATA:  I have reviewed the data as  listed No results found for this or any previous visit (from the past 2160 hour(s)).  RADIOGRAPHIC STUDIES: I have personally reviewed the radiological images as  listed and agreed with the findings in the report. No results found.  ASSESSMENT & PLAN: 1.  Normocytic anemia - Seen  at the request of Delman Cheadle, PA-C/Lawrence Fusco MD [facility]) for evaluation of normocytic anemia. - Labs sent by PCP (12/25/2021) showed Hgb 9.9/MCV 84 with normal platelets 335, normal WBC 8.3.  She has normal kidney function with creatinine 0.80 and EGFR 88. - Additional lab review per EMR shows a longstanding history of intermittent anemia since at least 2009, with Hgb ranging from normal to as low as 6.4.  Previous labs (2020) have also shown iron deficiency. - History of GI bleeding from erosive esophagitis and small bowel AVMs.  Has had several hospitalizations and required multiple blood transfusions.  Follows with GI at [facility] (Dr. Steward Drone). - Bleeding *** - Symptoms *** - Iron *** - PLAN: We will check repeat CBC and iron panel today.  We will check stool cards.  We will check nutritional panel, SPEP, and full anemia panel to rule out other causes of normocytic anemia. - RTC in 2 to 3 weeks to discuss results and next steps.  ***.  Other history - *** - PLAN: ***    PLAN SUMMARY & DISPOSITION: ***  All questions were answered. The patient knows to call the clinic with any problems, questions or concerns.   Medical decision making: ***  Time spent on visit: I spent {CHL ONC TIME VISIT - YYFRT:[id number]} counseling the patient face to face. The total time spent in the appointment was {CHL ONC TIME VISIT - OLIDC:[id number]} and more than 50% was on counseling.  I, Tarri Abernethy PA-C, have seen this patient in conjunction with Dr. Derek Jack.  Greater than 50% of visit was performed by Dr. Delton Coombes.   Harriett Rush, PA-C ***  ***  DR. Delton CoombesMarland Kitchen

## 2022-01-29 ENCOUNTER — Inpatient Hospital Stay (HOSPITAL_COMMUNITY): Payer: Medicare Other

## 2022-01-29 ENCOUNTER — Encounter (HOSPITAL_COMMUNITY): Payer: Self-pay | Admitting: Hematology

## 2022-01-29 ENCOUNTER — Inpatient Hospital Stay (HOSPITAL_COMMUNITY): Payer: Medicare Other | Attending: Hematology | Admitting: Hematology

## 2022-01-29 VITALS — BP 106/68 | HR 100 | Temp 99.2°F | Resp 16 | Ht 62.0 in | Wt 119.0 lb

## 2022-01-29 DIAGNOSIS — J449 Chronic obstructive pulmonary disease, unspecified: Secondary | ICD-10-CM | POA: Diagnosis not present

## 2022-01-29 DIAGNOSIS — K589 Irritable bowel syndrome without diarrhea: Secondary | ICD-10-CM | POA: Diagnosis not present

## 2022-01-29 DIAGNOSIS — G8929 Other chronic pain: Secondary | ICD-10-CM | POA: Insufficient documentation

## 2022-01-29 DIAGNOSIS — Z87891 Personal history of nicotine dependence: Secondary | ICD-10-CM

## 2022-01-29 DIAGNOSIS — E785 Hyperlipidemia, unspecified: Secondary | ICD-10-CM | POA: Diagnosis not present

## 2022-01-29 DIAGNOSIS — R634 Abnormal weight loss: Secondary | ICD-10-CM | POA: Diagnosis not present

## 2022-01-29 DIAGNOSIS — Z7951 Long term (current) use of inhaled steroids: Secondary | ICD-10-CM | POA: Insufficient documentation

## 2022-01-29 DIAGNOSIS — G4730 Sleep apnea, unspecified: Secondary | ICD-10-CM | POA: Insufficient documentation

## 2022-01-29 DIAGNOSIS — K920 Hematemesis: Secondary | ICD-10-CM | POA: Insufficient documentation

## 2022-01-29 DIAGNOSIS — D649 Anemia, unspecified: Secondary | ICD-10-CM | POA: Diagnosis present

## 2022-01-29 DIAGNOSIS — Z7984 Long term (current) use of oral hypoglycemic drugs: Secondary | ICD-10-CM | POA: Insufficient documentation

## 2022-01-29 DIAGNOSIS — F17210 Nicotine dependence, cigarettes, uncomplicated: Secondary | ICD-10-CM | POA: Insufficient documentation

## 2022-01-29 DIAGNOSIS — K861 Other chronic pancreatitis: Secondary | ICD-10-CM | POA: Diagnosis not present

## 2022-01-29 DIAGNOSIS — Z79890 Hormone replacement therapy (postmenopausal): Secondary | ICD-10-CM | POA: Diagnosis not present

## 2022-01-29 DIAGNOSIS — Z79624 Long term (current) use of inhibitors of nucleotide synthesis: Secondary | ICD-10-CM | POA: Insufficient documentation

## 2022-01-29 DIAGNOSIS — I10 Essential (primary) hypertension: Secondary | ICD-10-CM | POA: Insufficient documentation

## 2022-01-29 DIAGNOSIS — E039 Hypothyroidism, unspecified: Secondary | ICD-10-CM | POA: Diagnosis not present

## 2022-01-29 DIAGNOSIS — Z79899 Other long term (current) drug therapy: Secondary | ICD-10-CM | POA: Insufficient documentation

## 2022-01-29 DIAGNOSIS — D500 Iron deficiency anemia secondary to blood loss (chronic): Secondary | ICD-10-CM | POA: Insufficient documentation

## 2022-01-29 HISTORY — DX: Iron deficiency anemia secondary to blood loss (chronic): D50.0

## 2022-01-29 LAB — COMPREHENSIVE METABOLIC PANEL
ALT: 14 U/L (ref 0–44)
AST: 18 U/L (ref 15–41)
Albumin: 3.1 g/dL — ABNORMAL LOW (ref 3.5–5.0)
Alkaline Phosphatase: 89 U/L (ref 38–126)
Anion gap: 6 (ref 5–15)
BUN: 12 mg/dL (ref 6–20)
CO2: 29 mmol/L (ref 22–32)
Calcium: 8.7 mg/dL — ABNORMAL LOW (ref 8.9–10.3)
Chloride: 102 mmol/L (ref 98–111)
Creatinine, Ser: 0.74 mg/dL (ref 0.44–1.00)
GFR, Estimated: >60 mL/min (ref >60)
Glucose, Bld: 115 mg/dL — ABNORMAL HIGH (ref 70–99)
Potassium: 3.4 mmol/L — ABNORMAL LOW (ref 3.5–5.1)
Sodium: 137 mmol/L (ref 135–145)
Total Bilirubin: 0.3 mg/dL (ref 0.3–1.2)
Total Protein: 6.3 g/dL — ABNORMAL LOW (ref 6.5–8.1)

## 2022-01-29 LAB — IRON AND TIBC
Iron: 17 ug/dL — ABNORMAL LOW (ref 28–170)
Saturation Ratios: 4 % — ABNORMAL LOW (ref 10.4–31.8)
TIBC: 415 ug/dL (ref 250–450)
UIBC: 398 ug/dL

## 2022-01-29 LAB — CBC WITH DIFFERENTIAL/PLATELET
Abs Immature Granulocytes: 0.03 10*3/uL (ref 0.00–0.07)
Basophils Absolute: 0 10*3/uL (ref 0.0–0.1)
Basophils Relative: 0 %
Eosinophils Absolute: 0.4 10*3/uL (ref 0.0–0.5)
Eosinophils Relative: 5 %
HCT: 29.8 % — ABNORMAL LOW (ref 36.0–46.0)
Hemoglobin: 9 g/dL — ABNORMAL LOW (ref 12.0–15.0)
Immature Granulocytes: 0 %
Lymphocytes Relative: 26 %
Lymphs Abs: 2.1 10*3/uL (ref 0.7–4.0)
MCH: 25.7 pg — ABNORMAL LOW (ref 26.0–34.0)
MCHC: 30.2 g/dL (ref 30.0–36.0)
MCV: 85.1 fL (ref 80.0–100.0)
Monocytes Absolute: 0.6 10*3/uL (ref 0.1–1.0)
Monocytes Relative: 8 %
Neutro Abs: 5.1 10*3/uL (ref 1.7–7.7)
Neutrophils Relative %: 61 %
Platelets: 312 10*3/uL (ref 150–400)
RBC: 3.5 MIL/uL — ABNORMAL LOW (ref 3.87–5.11)
RDW: 18.4 % — ABNORMAL HIGH (ref 11.5–15.5)
WBC: 8.3 10*3/uL (ref 4.0–10.5)
nRBC: 0 % (ref 0.0–0.2)

## 2022-01-29 LAB — RETICULOCYTES
Immature Retic Fract: 31.7 % — ABNORMAL HIGH (ref 2.3–15.9)
RBC.: 3.96 MIL/uL (ref 3.87–5.11)
Retic Count, Absolute: 74.8 10*3/uL (ref 19.0–186.0)
Retic Ct Pct: 1.9 % (ref 0.4–3.1)

## 2022-01-29 LAB — LACTATE DEHYDROGENASE: LDH: 137 U/L (ref 98–192)

## 2022-01-29 LAB — FOLATE: Folate: 20.7 ng/mL (ref >5.9)

## 2022-01-29 LAB — FERRITIN: Ferritin: 4 ng/mL — ABNORMAL LOW (ref 11–307)

## 2022-01-29 LAB — SAMPLE TO BLOOD BANK

## 2022-01-29 LAB — VITAMIN B12: Vitamin B-12: 132 pg/mL — ABNORMAL LOW (ref 180–914)

## 2022-01-29 NOTE — Patient Instructions (Signed)
[facility] Discharge Instructions  You were seen today by Dr. Delton Coombes & Tarri Abernethy PA-C for your anemia.  ANEMIA:  We suspect that your anemia is related to iron deficiency from chronic blood loss.   We will check repeat blood count today to see if you need blood transfusion. We will check your iron levels as well as other labs to see if you have any other cause of your anemia. We will schedule you for IV iron x3 doses.  (Please see attached handout for further information regarding possible side effects from IV iron.) You can STOP taking your iron pill, since it is causing side effects and is likely not being absorbed by your body.  TOBACCO USE: We will schedule you for low-dose CT scan of your chest to screen for lung cancer.  This test will be repeated once per year.  FOLLOW-UP APPOINTMENT: We will recheck your blood and iron levels with follow-up visit in 2 months.   - - - - - - - - - - - - - - - - - -    Thank you for choosing [place] at [facility] to provide your oncology and hematology care.  To afford each patient quality time with our provider, please arrive at least 15 minutes before your scheduled appointment time.   If you have a lab appointment with the [facility] please come in thru the Main Entrance and check in at the main information desk.  You need to re-schedule your appointment should you arrive 10 or more minutes late.  We strive to give you quality time with our providers, and arriving late affects you and other patients whose appointments are after yours.  Also, if you no show three or more times for appointments you may be dismissed from the clinic at the providers discretion.     Again, thank you for choosing [facility].  Our hope is that these requests will decrease the amount of time that you wait before being seen by our physicians.        _____________________________________________________________  Should you have questions after your visit to [facility], please contact our office at [phone] and follow the prompts.  Our office hours are 8:00 a.m. and 4:30 p.m. Monday - Friday.  Please note that voicemails left after 4:00 p.m. may not be returned until the following business day.  We are closed weekends and major holidays.  You do have access to a nurse 24-7, just call the main number to the clinic [phone] and do not press any options, hold on the line and a nurse will answer the phone.    For prescription refill requests, have your pharmacy contact our office and allow 72 hours.    Due to Covid, you will need to wear a mask upon entering the hospital. If you do not have a mask, a mask will be given to you at the Main Entrance upon arrival. For doctor visits, patients may have 1 support person age 88 or older with them. For treatment visits, patients can not have anyone with them due to social distancing guidelines and our immunocompromised population.

## 2022-01-30 LAB — KAPPA/LAMBDA LIGHT CHAINS
Kappa free light chain: 30.8 mg/L — ABNORMAL HIGH (ref 3.3–19.4)
Kappa, lambda light chain ratio: 1.27 (ref 0.26–1.65)
Lambda free light chains: 24.3 mg/L (ref 5.7–26.3)

## 2022-02-01 LAB — METHYLMALONIC ACID, SERUM: Methylmalonic Acid, Quantitative: 1497 nmol/L — ABNORMAL HIGH (ref 0–378)

## 2022-02-02 ENCOUNTER — Inpatient Hospital Stay (HOSPITAL_COMMUNITY): Payer: Medicare Other

## 2022-02-02 ENCOUNTER — Other Ambulatory Visit (HOSPITAL_COMMUNITY): Payer: Self-pay | Admitting: Physician Assistant

## 2022-02-02 ENCOUNTER — Ambulatory Visit (HOSPITAL_COMMUNITY): Payer: Medicare Other | Admitting: Dietician

## 2022-02-02 VITALS — BP 109/64 | HR 86 | Temp 98.5°F | Resp 18

## 2022-02-02 DIAGNOSIS — D500 Iron deficiency anemia secondary to blood loss (chronic): Secondary | ICD-10-CM

## 2022-02-02 DIAGNOSIS — D649 Anemia, unspecified: Secondary | ICD-10-CM | POA: Diagnosis not present

## 2022-02-02 LAB — PROTEIN ELECTROPHORESIS, SERUM
A/G Ratio: 1.1 (ref 0.7–1.7)
Albumin ELP: 3.2 g/dL (ref 2.9–4.4)
Alpha-1-Globulin: 0.3 g/dL (ref 0.0–0.4)
Alpha-2-Globulin: 0.9 g/dL (ref 0.4–1.0)
Beta Globulin: 0.8 g/dL (ref 0.7–1.3)
Gamma Globulin: 0.9 g/dL (ref 0.4–1.8)
Globulin, Total: 2.9 g/dL (ref 2.2–3.9)
Total Protein ELP: 6.1 g/dL (ref 6.0–8.5)

## 2022-02-02 LAB — COPPER, SERUM: Copper: 116 ug/dL (ref 80–158)

## 2022-02-02 LAB — HOMOCYSTEINE: Homocysteine: 16.8 umol/L — ABNORMAL HIGH (ref 0.0–14.5)

## 2022-02-02 MED ORDER — CYANOCOBALAMIN 1000 MCG/ML IJ SOLN
1000.0000 ug | Freq: Once | INTRAMUSCULAR | Status: AC
  Administered 2022-02-02: 1000 ug via INTRAMUSCULAR
  Filled 2022-02-02: qty 1

## 2022-02-02 MED ORDER — LORATADINE 10 MG PO TABS
10.0000 mg | ORAL_TABLET | Freq: Once | ORAL | Status: AC
  Administered 2022-02-02: 10 mg via ORAL
  Filled 2022-02-02: qty 1

## 2022-02-02 MED ORDER — SODIUM CHLORIDE 0.9 % IV SOLN
300.0000 mg | Freq: Once | INTRAVENOUS | Status: DC
  Filled 2022-02-02: qty 15

## 2022-02-02 MED ORDER — FAMOTIDINE IN NACL 20-0.9 MG/50ML-% IV SOLN
20.0000 mg | Freq: Once | INTRAVENOUS | Status: AC
  Administered 2022-02-02: 20 mg via INTRAVENOUS

## 2022-02-02 MED ORDER — SODIUM CHLORIDE 0.9 % IV SOLN: Freq: Once | INTRAVENOUS | Status: AC

## 2022-02-02 MED ORDER — ACETAMINOPHEN 325 MG PO TABS
650.0000 mg | ORAL_TABLET | Freq: Once | ORAL | Status: AC
  Administered 2022-02-02: 650 mg via ORAL
  Filled 2022-02-02: qty 2

## 2022-02-02 NOTE — Patient Instructions (Signed)
[facility]  Discharge Instructions: Thank you for choosing [facility] to provide your oncology and hematology care.  If you have a lab appointment with the Cancer Center, please come in thru the Main Entrance and check in at the main information desk.  Wear comfortable clothing and clothing appropriate for easy access to any Portacath or PICC line.   We strive to give you quality time with your provider. You may need to reschedule your appointment if you arrive late (15 or more minutes).  Arriving late affects you and other patients whose appointments are after yours.  Also, if you miss three or more appointments without notifying the office, you may be dismissed from the clinic at the provider's discretion.      For prescription refill requests, have your pharmacy contact our office and allow 72 hours for refills to be completed.    Today you received the following chemotherapy and/or immunotherapy agents Venofer 300 mg.  Iron Sucrose Injection What is this medication? IRON SUCROSE (EYE ern SOO krose) treats low levels of iron (iron deficiency anemia) in people with kidney disease. Iron is a mineral that plays an important role in making red blood cells, which carry oxygen from your lungs to the rest of your body. This medicine may be used for other purposes; ask your health care provider or pharmacist if you have questions. COMMON BRAND NAME(S): Venofer What should I tell my care team before I take this medication? They need to know if you have any of these conditions: Anemia not caused by low iron levels Heart disease High levels of iron in the blood Kidney disease Liver disease An unusual or allergic reaction to iron, other medications, foods, dyes, or preservatives Pregnant or trying to get pregnant Breast-feeding How should I use this medication? This medication is for infusion into a vein. It is given in a hospital or clinic setting. Talk to your care  team about the use of this medication in children. While this medication may be prescribed for children as young as 2 years for selected conditions, precautions do apply. Overdosage: If you think you have taken too much of this medicine contact a poison control center or emergency room at once. NOTE: This medicine is only for you. Do not share this medicine with others. What if I miss a dose? It is important not to miss your dose. Call your care team if you are unable to keep an appointment. What may interact with this medication? Do not take this medication with any of the following: Deferoxamine Dimercaprol Other iron products This medication may also interact with the following: Chloramphenicol Deferasirox This list may not describe all possible interactions. Give your health care provider a list of all the medicines, herbs, non-prescription drugs, or dietary supplements you use. Also tell them if you smoke, drink alcohol, or use illegal drugs. Some items may interact with your medicine. What should I watch for while using this medication? Visit your care team regularly. Tell your care team if your symptoms do not start to get better or if they get worse. You may need blood work done while you are taking this medication. You may need to follow a special diet. Talk to your care team. Foods that contain iron include: whole grains/cereals, dried fruits, beans, or peas, leafy green vegetables, and organ meats (liver, kidney). What side effects may I notice from receiving this medication? Side effects that you should report to your care team as soon   as possible: Allergic reactions--skin rash, itching, hives, swelling of the face, lips, tongue, or throat Low blood pressure--dizziness, feeling faint or lightheaded, blurry vision Shortness of breath Side effects that usually do not require medical attention (report to your care team if they continue or are bothersome): Flushing Headache Joint  pain Muscle pain Nausea Pain, redness, or irritation at injection site This list may not describe all possible side effects. Call your doctor for medical advice about side effects. You may report side effects to FDA at 1-800-FDA-1088. Where should I keep my medication? This medication is given in a hospital or clinic and will not be stored at home. NOTE: This sheet is a summary. It may not cover all possible information. If you have questions about this medicine, talk to your doctor, pharmacist, or health care provider.  2023 Elsevier/Gold Standard (2020-12-06 00:00:00)       To help prevent nausea and vomiting after your treatment, we encourage you to take your nausea medication as directed.  BELOW ARE SYMPTOMS THAT SHOULD BE REPORTED IMMEDIATELY: *FEVER GREATER THAN 100.4 F (38 C) OR HIGHER *CHILLS OR SWEATING *NAUSEA AND VOMITING THAT IS NOT CONTROLLED WITH YOUR NAUSEA MEDICATION *UNUSUAL SHORTNESS OF BREATH *UNUSUAL BRUISING OR BLEEDING *URINARY PROBLEMS (pain or burning when urinating, or frequent urination) *BOWEL PROBLEMS (unusual diarrhea, constipation, pain near the anus) TENDERNESS IN MOUTH AND THROAT WITH OR WITHOUT PRESENCE OF ULCERS (sore throat, sores in mouth, or a toothache) UNUSUAL RASH, SWELLING OR PAIN  UNUSUAL VAGINAL DISCHARGE OR ITCHING   Items with * indicate a potential emergency and should be followed up as soon as possible or go to the Emergency Department if any problems should occur.  Please show the CHEMOTHERAPY ALERT CARD or IMMUNOTHERAPY ALERT CARD at check-in to the Emergency Department and triage nurse.  Should you have questions after your visit or need to cancel or reschedule your appointment, please contact [facility] [phone]  and follow the prompts.  Office hours are 8:00 a.m. to 4:30 p.m. Monday - Friday. Please note that voicemails left after 4:00 p.m. may not be returned until the following business day.  We are closed weekends  and major holidays. You have access to a nurse at all times for urgent questions. Please call the main number to the clinic [phone] and follow the prompts.  For any non-urgent questions, you may also contact your provider using MyChart. We now offer e-Visits for anyone 18 and older to request care online for non-urgent symptoms. For details visit [email or web address].   Also download the MyChart app! Go to the app store, search "MyChart", open the app, select [name], and log in with your MyChart username and password.  Masks are optional in the cancer centers. If you would like for your care team to wear a mask while they are taking care of you, please let them know. For doctor visits, patients may have with them one support person who is at least 53 years old. At this time, visitors are not allowed in the infusion area.  

## 2022-02-02 NOTE — Progress Notes (Signed)
Anemia work-up revealed vitamin B12 deficiency with low vitamin B12 132 and elevated methylmalonic acid at 1497.  We will schedule her for weekly vitamin B12 1000 mcg x 4 doses, followed by monthly vitamin B12 injections.

## 2022-02-02 NOTE — Progress Notes (Signed)
Venofer 300 mg given today per MD orders. Tolerated infusion without adverse affects. Vital signs stable. No complaints at this time. Discharged from clinic by wheel chair  in stable condition. Alert and oriented x 3. F/U with [facility] as scheduled.   

## 2022-02-02 NOTE — Progress Notes (Signed)
Nutrition Assessment   Reason for Assessment: MST   ASSESSMENT: 51 year old female with IDA due to chronic blood loss. She is receiving IV iron with venofer   Past medical history includes anemia, anxiety, bipolar 1 disorder, COPD, DM, depression, diabetic neuropathy, esophagitis, hiatal hernia, HLD, idiopathic chronic pancreatitis.   Met with patient in infusion. She reports appetite has been better recently, eating 3 meals daily most days. Per recall, patient had sausage biscuit for breakfast, bacon cheeseburger for lunch. Dinner is meat with 2 vegetables. Patient reports occasional poor appetite. She will not eat anything on these days. Patient likes Boost/Ensure but does not drink these on regularly. Patient reports drinking some water. This is something she "is not good at but working on." She mostly drinks ginger ale. Patient denies nausea, vomiting, diarrhea, constipation.    Nutrition Focused Physical Exam:    Medications: elavil, lipitor, dulcolax, symbicort, buspar, ferrous sulfate, lasix, gabapentin, synthroid, zestril, mag-ox, xofran, roxicodone, zenpep, protonix, miralax, valtrex    Labs: 7/6 labs reviewed - K 3.4, Glucose 115   Anthropometrics: Per chart, patient weighed 132 lb 4.4 oz on 08/24/21. This indicates 10% (13 lb) weight loss 5 months; significant for time frame  Height: 5'2" Weight: 119 lb  UBW: 118-125 lb (per pt weight fluctuates) BMI: 21.77   NUTRITION DIAGNOSIS: Food and nutrition related knowledge deficit related to chronic illness (IDA, COPD) as evidenced by reported no prior nutrition education received    INTERVENTION:  Educated on importance of adequate calorie and protein energy intake to maintain weights/strength - handout with snack ideas provided Educated on importance of not skipping meals and encouraged drinking oral nutrition supplements with decreased appetite - coupons + samples of Ensure Complete provided today Discussed foods to help  fight anemia - handout provided Encouraged soft moist high protein foods for ease of intake - handout with ideas provided Contact information given   MONITORING, EVALUATION, GOAL: Patient will tolerate increased calories and protein to minimize weight loss    Next Visit: To be scheduled as needed

## 2022-02-04 LAB — IMMUNOFIXATION ELECTROPHORESIS
IgA: 216 mg/dL (ref 87–352)
IgG (Immunoglobin G), Serum: 867 mg/dL (ref 586–1602)
IgM (Immunoglobulin M), Srm: 107 mg/dL (ref 26–217)
Total Protein ELP: 6.2 g/dL (ref 6.0–8.5)

## 2022-02-09 ENCOUNTER — Inpatient Hospital Stay (HOSPITAL_COMMUNITY): Payer: Medicare Other

## 2022-02-09 ENCOUNTER — Encounter (HOSPITAL_COMMUNITY): Payer: Self-pay

## 2022-02-09 VITALS — BP 107/63 | HR 98 | Temp 98.3°F | Resp 18

## 2022-02-09 DIAGNOSIS — D649 Anemia, unspecified: Secondary | ICD-10-CM | POA: Diagnosis not present

## 2022-02-09 DIAGNOSIS — D500 Iron deficiency anemia secondary to blood loss (chronic): Secondary | ICD-10-CM

## 2022-02-09 MED ORDER — SODIUM CHLORIDE 0.9 % IV SOLN
300.0000 mg | Freq: Once | INTRAVENOUS | Status: AC
  Administered 2022-02-09: 300 mg via INTRAVENOUS
  Filled 2022-02-09: qty 300

## 2022-02-09 MED ORDER — FAMOTIDINE IN NACL 20-0.9 MG/50ML-% IV SOLN
20.0000 mg | Freq: Once | INTRAVENOUS | Status: AC
  Administered 2022-02-09: 20 mg via INTRAVENOUS
  Filled 2022-02-09: qty 50

## 2022-02-09 MED ORDER — SODIUM CHLORIDE 0.9 % IV SOLN: Freq: Once | INTRAVENOUS | Status: AC

## 2022-02-09 MED ORDER — CYANOCOBALAMIN 1000 MCG/ML IJ SOLN
1000.0000 ug | Freq: Once | INTRAMUSCULAR | Status: AC
  Administered 2022-02-09: 1000 ug via INTRAMUSCULAR
  Filled 2022-02-09: qty 1

## 2022-02-09 MED ORDER — LORATADINE 10 MG PO TABS
10.0000 mg | ORAL_TABLET | Freq: Once | ORAL | Status: AC
  Administered 2022-02-09: 10 mg via ORAL
  Filled 2022-02-09: qty 1

## 2022-02-09 MED ORDER — ACETAMINOPHEN 325 MG PO TABS
650.0000 mg | ORAL_TABLET | Freq: Once | ORAL | Status: AC
  Administered 2022-02-09: 650 mg via ORAL
  Filled 2022-02-09: qty 2

## 2022-02-09 NOTE — Progress Notes (Signed)
Patient tolerated B12 injection with no complaints voiced. Site clean and dry with no bruising or swelling noted at site. See MAR for details. Band aid applied.  Patient stable during and after injection.  Patient tolerated iron infusion with no complaints voiced. Peripheral IV site clean and dry with good blood return noted before and after infusion. Band aid applied. VSS with discharge and left in satisfactory condition with no s/s of distress noted.

## 2022-02-09 NOTE — Patient Instructions (Signed)
[facility]  Discharge Instructions: Thank you for choosing [facility] to provide your oncology and hematology care.  If you have a lab appointment with the [facility], please come in thru the Main Entrance and check in at the main information desk.  Wear comfortable clothing and clothing appropriate for easy access to any Portacath or PICC line.   We strive to give you quality time with your provider. You may need to reschedule your appointment if you arrive late (15 or more minutes).  Arriving late affects you and other patients whose appointments are after yours.  Also, if you miss three or more appointments without notifying the office, you may be dismissed from the clinic at the provider's discretion.      For prescription refill requests, have your pharmacy contact our office and allow 72 hours for refills to be completed.    Today you received the following Venofer and B12 injection, return as scheduled.   To help prevent nausea and vomiting after your treatment, we encourage you to take your nausea medication as directed.  BELOW ARE SYMPTOMS THAT SHOULD BE REPORTED IMMEDIATELY: *FEVER GREATER THAN 100.4 F (38 C) OR HIGHER *CHILLS OR SWEATING *NAUSEA AND VOMITING THAT IS NOT CONTROLLED WITH YOUR NAUSEA MEDICATION *UNUSUAL SHORTNESS OF BREATH *UNUSUAL BRUISING OR BLEEDING *URINARY PROBLEMS (pain or burning when urinating, or frequent urination) *BOWEL PROBLEMS (unusual diarrhea, constipation, pain near the anus) TENDERNESS IN MOUTH AND THROAT WITH OR WITHOUT PRESENCE OF ULCERS (sore throat, sores in mouth, or a toothache) UNUSUAL RASH, SWELLING OR PAIN  UNUSUAL VAGINAL DISCHARGE OR ITCHING   Items with * indicate a potential emergency and should be followed up as soon as possible or go to the Emergency Department if any problems should occur.  Please show the CHEMOTHERAPY ALERT CARD or IMMUNOTHERAPY ALERT CARD at check-in to the Emergency Department  and triage nurse.  Should you have questions after your visit or need to cancel or reschedule your appointment, please contact [facility] [phone]  and follow the prompts.  Office hours are 8:00 a.m. to 4:30 p.m. Monday - Friday. Please note that voicemails left after 4:00 p.m. may not be returned until the following business day.  We are closed weekends and major holidays. You have access to a nurse at all times for urgent questions. Please call the main number to the clinic [phone] and follow the prompts.  For any non-urgent questions, you may also contact your provider using MyChart. We now offer e-Visits for anyone 69 and older to request care online for non-urgent symptoms. For details visit [email or web address].   Also download the MyChart app! Go to the app store, search "MyChart", open the app, select [facility], and log in with your MyChart username and password.  Masks are optional in the cancer centers. If you would like for your care team to wear a mask while they are taking care of you, please let them know. For doctor visits, patients may have with them one support person who is at least 53 years old. At this time, visitors are not allowed in the infusion area.

## 2022-02-16 ENCOUNTER — Inpatient Hospital Stay (HOSPITAL_COMMUNITY): Payer: Medicare Other

## 2022-02-16 ENCOUNTER — Emergency Department (HOSPITAL_COMMUNITY): Payer: Medicare Other

## 2022-02-16 ENCOUNTER — Other Ambulatory Visit: Payer: Self-pay

## 2022-02-16 ENCOUNTER — Encounter (HOSPITAL_COMMUNITY): Payer: Self-pay | Admitting: Emergency Medicine

## 2022-02-16 ENCOUNTER — Emergency Department (HOSPITAL_COMMUNITY)
Admission: EM | Admit: 2022-02-16 | Discharge: 2022-02-17 | Disposition: A | Discharge status: Home / Self Care | Payer: Medicare Other | Attending: Emergency Medicine | Admitting: Emergency Medicine

## 2022-02-16 DIAGNOSIS — S32010A Wedge compression fracture of first lumbar vertebra, initial encounter for closed fracture: Secondary | ICD-10-CM

## 2022-02-16 DIAGNOSIS — J45909 Unspecified asthma, uncomplicated: Secondary | ICD-10-CM | POA: Diagnosis not present

## 2022-02-16 DIAGNOSIS — S22088A Other fracture of T11-T12 vertebra, initial encounter for closed fracture: Secondary | ICD-10-CM | POA: Insufficient documentation

## 2022-02-16 DIAGNOSIS — D649 Anemia, unspecified: Secondary | ICD-10-CM | POA: Insufficient documentation

## 2022-02-16 DIAGNOSIS — Y9301 Activity, walking, marching and hiking: Secondary | ICD-10-CM | POA: Diagnosis not present

## 2022-02-16 DIAGNOSIS — Z7984 Long term (current) use of oral hypoglycemic drugs: Secondary | ICD-10-CM | POA: Insufficient documentation

## 2022-02-16 DIAGNOSIS — K5900 Constipation, unspecified: Secondary | ICD-10-CM | POA: Diagnosis not present

## 2022-02-16 DIAGNOSIS — W010XXA Fall on same level from slipping, tripping and stumbling without subsequent striking against object, initial encounter: Secondary | ICD-10-CM | POA: Insufficient documentation

## 2022-02-16 DIAGNOSIS — E119 Type 2 diabetes mellitus without complications: Secondary | ICD-10-CM | POA: Insufficient documentation

## 2022-02-16 DIAGNOSIS — J449 Chronic obstructive pulmonary disease, unspecified: Secondary | ICD-10-CM | POA: Diagnosis not present

## 2022-02-16 DIAGNOSIS — S32009A Unspecified fracture of unspecified lumbar vertebra, initial encounter for closed fracture: Secondary | ICD-10-CM

## 2022-02-16 DIAGNOSIS — Z7951 Long term (current) use of inhaled steroids: Secondary | ICD-10-CM | POA: Diagnosis not present

## 2022-02-16 DIAGNOSIS — S32018A Other fracture of first lumbar vertebra, initial encounter for closed fracture: Secondary | ICD-10-CM | POA: Insufficient documentation

## 2022-02-16 DIAGNOSIS — K449 Diaphragmatic hernia without obstruction or gangrene: Secondary | ICD-10-CM

## 2022-02-16 DIAGNOSIS — S3992XA Unspecified injury of lower back, initial encounter: Secondary | ICD-10-CM | POA: Diagnosis present

## 2022-02-16 DIAGNOSIS — W19XXXA Unspecified fall, initial encounter: Secondary | ICD-10-CM

## 2022-02-16 DIAGNOSIS — S2232XA Fracture of one rib, left side, initial encounter for closed fracture: Secondary | ICD-10-CM | POA: Insufficient documentation

## 2022-02-16 LAB — COMPREHENSIVE METABOLIC PANEL
ALT: 15 U/L (ref 0–44)
AST: 17 U/L (ref 15–41)
Albumin: 3.5 g/dL (ref 3.5–5.0)
Alkaline Phosphatase: 86 U/L (ref 38–126)
Anion gap: 9 (ref 5–15)
BUN: 18 mg/dL (ref 6–20)
CO2: 25 mmol/L (ref 22–32)
Calcium: 8.7 mg/dL — ABNORMAL LOW (ref 8.9–10.3)
Chloride: 105 mmol/L (ref 98–111)
Creatinine, Ser: 0.99 mg/dL (ref 0.44–1.00)
GFR, Estimated: >60 mL/min (ref >60)
Glucose, Bld: 89 mg/dL (ref 70–99)
Potassium: 3.6 mmol/L (ref 3.5–5.1)
Sodium: 139 mmol/L (ref 135–145)
Total Bilirubin: 0.5 mg/dL (ref 0.3–1.2)
Total Protein: 6.8 g/dL (ref 6.5–8.1)

## 2022-02-16 LAB — CBC
HCT: 33.6 % — ABNORMAL LOW (ref 36.0–46.0)
Hemoglobin: 10.3 g/dL — ABNORMAL LOW (ref 12.0–15.0)
MCH: 27.4 pg (ref 26.0–34.0)
MCHC: 30.7 g/dL (ref 30.0–36.0)
MCV: 89.4 fL (ref 80.0–100.0)
Platelets: 234 10*3/uL (ref 150–400)
RBC: 3.76 MIL/uL — ABNORMAL LOW (ref 3.87–5.11)
RDW: 22.3 % — ABNORMAL HIGH (ref 11.5–15.5)
WBC: 9.3 10*3/uL (ref 4.0–10.5)
nRBC: 0 % (ref 0.0–0.2)

## 2022-02-16 LAB — LIPASE, BLOOD: Lipase: 19 U/L (ref 11–51)

## 2022-02-16 MED ORDER — IOHEXOL 300 MG/ML  SOLN
100.0000 mL | Freq: Once | INTRAMUSCULAR | Status: AC | PRN
  Administered 2022-02-16: 80 mL via INTRAVENOUS

## 2022-02-16 MED ORDER — OXYCODONE HCL 5 MG PO TABS
15.0000 mg | ORAL_TABLET | Freq: Once | ORAL | Status: AC
  Administered 2022-02-16: 15 mg via ORAL
  Filled 2022-02-16: qty 3

## 2022-02-16 NOTE — Discharge Instructions (Addendum)
IMPRESSION:  1. Recent upper plate anterior wedge compression fracture L1, with  30% anterior and 10% posterior height loss and trace retropulsion.  2. Recent, likely acute nondisplaced fractures of the posteromedial  left twelfth rib and left L1 transverse process.  3. Old compression fractures of T10, T11 and T12.  4. Gastroenteritis without evidence of small-bowel obstruction or  inflammation. Moderate-sized hiatal hernia.  5. Constipation, most significantly in the ascending and proximal  transverse colon.  6. Upper normal appendiceal caliber but similar to prior studies  with no inflammatory change.    As we discussed today please schedule a follow-up appointment with your primary care doctor to discuss your ongoing constipation along with your pain management specialist. Please schedule a follow-up appointment with neurosurgery in 2 weeks. Your brace is primarily for comfort.    As we discussed I would recommend that you start taking MiraLAX.  Please take 2 doses a day with extra fluid for the next 2 days.  If after that you do not notice a change in your bowel movements then please increase to 3 doses a day. I suspect that your constipation is related to your ongoing opioid use. Please discuss this with your pain management specialist.

## 2022-02-16 NOTE — ED Provider Notes (Signed)
[facility] EMERGENCY DEPARTMENT Provider Note   CSN: [id number] Arrival date & time: 02/16/22  1247     History  Chief Complaint  Patient presents with   Back Pain    [name] is a 51 y.o. female who presents the emergency department complaining of lower back pain after mechanical fall 2 nights ago.  Patient states that she was walking in socks and slipped on a puddle on the floor, falling directly onto her lower back.  She reports a history of fractures in T11, T12, and L1.  She localizes her pain today to her low back radiating to her right hip.  She is also complaining constipation for the past week, has been trying MiraLAX without relief.  She is on chronic opioids and does report a history of constipation.  Still passing gas.   Back Pain Associated symptoms: no chest pain, no numbness and no weakness        Home Medications Prior to Admission medications   Medication Sig Start Date End Date Taking? Authorizing Provider  acetaminophen (TYLENOL) 500 MG tablet Take by mouth. 06/02/19   [provider]  albuterol (PROVENTIL HFA;VENTOLIN HFA) 108 (90 BASE) MCG/ACT inhaler Inhale 2 puffs into the lungs daily as needed for wheezing. For shortness of breath    [provider]  albuterol (PROVENTIL) (2.5 MG/3ML) 0.083% nebulizer solution Take 2.5 mg by nebulization every 6 (six) hours as needed for wheezing or shortness of breath.  05/24/19   [provider]  amitriptyline (ELAVIL) 100 MG tablet Take 100 mg by mouth at bedtime.    [provider]  atorvastatin (LIPITOR) 20 MG tablet Take 20 mg by mouth daily.    [provider]  azithromycin (ZITHROMAX) 500 MG tablet Take 1 tablet (500 mg total) by mouth daily. 08/27/21   Orson Eva, MD  bisacodyl (DULCOLAX) 5 MG EC tablet Take 1 tablet (5 mg total) by mouth 2 (two) times daily. 10/09/19   Wieters, Hallie C, PA-C  budesonide-formoterol (SYMBICORT) 80-4.5 MCG/ACT inhaler Inhale 2 puffs  into the lungs 2 (two) times daily. Patient taking differently: Inhale 2 puffs into the lungs daily. 03/30/14   Samuella Cota, MD  busPIRone (BUSPAR) 10 MG tablet Take 10 mg by mouth 3 (three) times daily.    [provider]  cefdinir (OMNICEF) 300 MG capsule Take 1 capsule (300 mg total) by mouth every 12 (twelve) hours. 08/26/21   Orson Eva, MD  citalopram (CELEXA) 40 MG tablet Take 40 mg by mouth daily. 05/17/19   [provider]  Docusate Sodium (DSS) 100 MG CAPS Take 1 capsule by mouth at bedtime.    [provider]  ferrous sulfate 325 (65 FE) MG tablet Take 325 mg by mouth daily.    [provider]  furosemide (LASIX) 20 MG tablet Take 20 mg by mouth daily. 02/19/20   [provider]  gabapentin (NEURONTIN) 300 MG capsule Take 300 mg by mouth 3 (three) times daily.  04/11/19   [provider]  IBU 800 MG tablet Take 800 mg by mouth every 8 (eight) hours. 10/13/21   [provider]  levothyroxine (SYNTHROID, LEVOTHROID) 25 MCG tablet Take 25 mcg by mouth daily before breakfast.    [provider]  lisinopril (PRINIVIL,ZESTRIL) 2.5 MG tablet Take 2.5 mg by mouth daily.  01/18/18   [provider]  magnesium oxide (MAG-OX) 400 (240 Mg) MG tablet Take 1 tablet (400 mg total) by mouth 2 (two) times  daily. 08/26/21   Orson Eva, MD  metFORMIN (GLUCOPHAGE) 500 MG tablet Take 2 tablets by mouth 2 (two) times daily.    [provider]  methocarbamol (ROBAXIN) 500 MG tablet Take 500 mg by mouth 3 (three) times daily.    [provider]  mupirocin cream (BACTROBAN) 2 % Apply 1 application topically daily. Apply to right 2nd toe and left great toe once daily until healed. 03/13/20   Marzetta Board, DPM  ondansetron (ZOFRAN) 4 MG tablet Take 4 mg by mouth every 8 (eight) hours as needed for vomiting or nausea. 06/02/19   [provider]  oxyCODONE (ROXICODONE) 15 MG immediate release tablet Take 15  mg by mouth 4 (four) times daily.  05/22/19   [provider]  OXYGEN Inhale 3 L into the lungs daily as needed (for shortness of breath).    [provider]  Pancrelipase, Lip-Prot-Amyl, (ZENPEP) 20000-63000 units CPEP Take 1 capsule by mouth 3 times a day with meals and 1 capsules with snacks. 09/11/19   [provider]  pantoprazole (PROTONIX) 40 MG tablet Take 40 mg by mouth 2 (two) times daily.    [provider]  polyethylene glycol powder (GLYCOLAX/MIRALAX) powder Take 17 g by mouth 2 (two) times daily. Until daily soft stools  OTC 03/05/15   Nona Dell, PA-C  SPIRIVA HANDIHALER 18 MCG inhalation capsule Place 18 mcg into inhaler and inhale daily.  04/11/19   [provider]  valACYclovir (VALTREX) 500 MG tablet Take 500 mg by mouth daily.    [provider]      Allergies    Penicillins, Sulfa antibiotics, Aspirin, Enoxaparin, Lovenox  [enoxaparin sodium], and Prednisone    Review of Systems   Review of Systems  Respiratory:  Negative for shortness of breath.   Cardiovascular:  Negative for chest pain, palpitations and leg swelling.  Gastrointestinal:  Positive for constipation.  Musculoskeletal:  Positive for arthralgias and back pain.  Neurological:  Negative for weakness and numbness.  All other systems reviewed and are negative.   Physical Exam Updated Vital Signs BP 109/65 (BP Location: Left Arm)   Pulse 94   Temp 98 F (36.7 C) (Oral)   Resp 14   Ht '5\' 2"'$  (1.575 m)   Wt 53.5 kg   SpO2 98%   BMI 21.58 kg/m  Physical Exam Vitals and nursing note reviewed.  Constitutional:      Appearance: Normal appearance.  HENT:     Head: Normocephalic and atraumatic.  Eyes:     Conjunctiva/sclera: Conjunctivae normal.  Cardiovascular:     Rate and Rhythm: Normal rate and regular rhythm.  Pulmonary:     Effort: Pulmonary effort is normal. No respiratory distress.     Breath sounds: Normal breath sounds.   Abdominal:     General: There is no distension.     Palpations: Abdomen is soft.     Tenderness: There is no abdominal tenderness.  Musculoskeletal:     Comments: Midline spinal tenderness to palpation of lower lumbar spine.  Normal strength and sensation bilateral lower extremities.  No pain over iliac crests.  Compartments of bilateral lower leg soft.  Skin:    General: Skin is warm and dry.  Neurological:     General: No focal deficit present.     Mental Status: She is alert.     ED Results / Procedures / Treatments   Labs (all labs ordered are listed, but only abnormal results are displayed) Labs  Reviewed  CBC - Abnormal; Notable for the following components:      Result Value   RBC 3.76 (*)    Hemoglobin 10.3 (*)    HCT 33.6 (*)    RDW 22.3 (*)    All other components within normal limits  COMPREHENSIVE METABOLIC PANEL  LIPASE, BLOOD    EKG None  Radiology No results found.  Procedures Procedures    Medications Ordered in ED Medications - No data to display  ED Course/ Medical Decision Making/ A&P                           Medical Decision Making Amount and/or Complexity of Data Reviewed Labs: ordered. Radiology: ordered.  This patient is a 51 y.o. female  who presents to the ED for concern of low back pain after mechanical fall two nights ago and constipation x 1 week.   Past Medical History / Co-morbidities: Asthma, chronic pancreatitis, bipolar 1 disorder, hyperlipidemia, diabetes, COPD, chronic iron deficiency anemia requiring iron infusions  Additional history: Chart reviewed. Pertinent results include: PMP reviewed and patient on 15 mg oxycodone 4 times daily, most recently filled on 7/20.  Physical Exam: Physical exam performed. The pertinent findings include: Midline spinal tenderness to palpation in the lower lumbar region.  Neurovascularly and neuromuscularly intact in bilateral lower extremities.  Pelvis stable without tenderness over iliac  crests.  Abdomen soft, nontender.  Lab Tests/Imaging studies: I personally interpreted labs/imaging and the pertinent results include: Hemoglobin 10.3, improved compared to prior.   CMP, lipase, CT abdomen pelvis and lumbar spine pending at time of shift change.   Disposition: Patient discussed and care transferred to [facility] at shift change. Please see his/her note for further details regarding further ED course and disposition. Plan at time of handoff is follow-up on labs and CT imaging. Disposition dependent on whether or not patient has acute fractures/dislocations.  Final Clinical Impression(s) / ED Diagnoses Final diagnoses:  None    Rx / DC Orders ED Discharge Orders     None      Portions of this report may have been transcribed using voice recognition software. Every effort was made to ensure accuracy; however, inadvertent computerized transcription errors may be present.    Estill Cotta 02/16/22 1921    Hayden Rasmussen, MD 02/17/22 1056

## 2022-02-16 NOTE — ED Triage Notes (Signed)
Pt presents with lower back pain after fall over weekend, denies LOC.

## 2022-02-16 NOTE — ED Notes (Signed)
Notified [facility] of patient needing a TLSO brace.

## 2022-02-16 NOTE — ED Provider Notes (Signed)
I assumed care of patient at shift change from previous team, please see their note for full H&P. Briefly patient is here for evaluation of a mechanical nonsyncopal fall landing on her lower back.  She is not having any new weakness or numbness and has been ambulatory since.  She denies striking her head or passing out. She chronically has issues with constipation, has taken 2 doses of MiraLAX recently without any relief.  She is on chronic narcotics and in pain management at baseline.   Clinical Course as of 02/16/22 2319  Mon Feb 16, 2022  2217 I spoke with Dr. Trenton Gammon of neurosurgery, recommends TLSO for comfort, two weeks outpatient follow up.  [EH]    Clinical Course User Index [EH] Lorin Glass, PA-C   CT ABDOMEN PELVIS W CONTRAST  Result Date: 02/16/2022 CLINICAL DATA:  [place] a few days ago, with abdominal pain and lower back pain. EXAM: CT ABDOMEN AND PELVIS WITH CONTRAST TECHNIQUE: Multidetector CT imaging of the abdomen and pelvis was performed using the standard protocol following bolus administration of intravenous contrast. RADIATION DOSE REDUCTION: This exam was performed according to the departmental dose-optimization program which includes automated exposure control, adjustment of the mA and/or kV according to patient size and/or use of iterative reconstruction technique. CONTRAST:  51m OMNIPAQUE IOHEXOL 300 MG/ML  SOLN COMPARISON:  CTs without contrast 05/30/2021 and 12/04/2019. FINDINGS: Lower chest: Lung bases show scattered linear scarring or atelectasis without acute process. The cardiac size is normal. Moderate-sized hiatal hernia contains the proximal half of the stomach intrathoracic. Hepatobiliary: No focal liver abnormality is seen. Status post cholecystectomy. No biliary dilatation. Pancreas: Atrophic and otherwise unremarkable, particularly in the tail. Spleen: No suspicious abnormality. No splenomegaly. There is a tiny hypodensity in the medial upper spleen which is  probably a small cyst and appears to have been present previously. Adrenals/Urinary Tract: No adrenal or renal cortical mass enhancement is seen and no calculus or hydronephrosis. Unremarkable bladder for the degree of distention. Stomach/Bowel: Moderate-sized hiatal hernia. Thickened folds noted chronically in the stomach, today also seen the duodenum and jejunum. There is no small bowel obstruction or inflammation. The appendix is upper-normal in caliber at 6 mm but is similar to prior studies without inflammation. Moderate to severe stool retention is increased in the cecum, ascending and proximal transverse colon, with moderate fecal stasis in the descending segment. No dilatation or wall thickening. Vascular/Lymphatic: Aortic atherosclerosis. No enlarged abdominal or pelvic lymph nodes. There are multiple pelvic phleboliths. Reproductive: Status post hysterectomy. No adnexal masses. Other: No abdominal wall or inguinal hernia. No free air, hemorrhage, abscess or free fluid. Musculoskeletal: There is evidence of a recent upper plate anterior wedge compression fracture of the L1 vertebral body. There is approximally 30% anterior and 10% posterior vertebral height loss with trace posterosuperior cortical retropulsion and slight nondisplaced fragmentation along the anterior superior vertebral body. There is osteopenia with additional note of mild paraspinal soft tissue thickening at L1, but no space-occupying paraspinal hematoma or spinal canal hematoma is seen. There is a chronic, unchanged moderate anterior wedge compression fracture of the T12 vertebral body and mild chronic wedging of T10 and T11 as well, also unchanged. Additionally noted likely acute nondisplaced fractures of the posteromedial left twelfth rib and the left L1 transverse process. There is subcutaneous stranding laterally in the right hip region but no pelvic or proximal femoral fractures IMPRESSION: 1. Recent upper plate anterior wedge  compression fracture L1, with 30% anterior and 10% posterior height loss and  trace retropulsion. 2. Recent, likely acute nondisplaced fractures of the posteromedial left twelfth rib and left L1 transverse process. 3. Old compression fractures of T10, T11 and T12. 4. Gastroenteritis without evidence of small-bowel obstruction or inflammation. Moderate-sized hiatal hernia. 5. Constipation, most significantly in the ascending and proximal transverse colon. 6. Upper normal appendiceal caliber but similar to prior studies with no inflammatory change. Electronically Signed   By: Telford Nab M.D.   On: 02/16/2022 20:20   CT L-SPINE NO CHARGE  Result Date: 02/16/2022 CLINICAL DATA:  Initial evaluation for back pain, recent fall. EXAM: CT LUMBAR SPINE WITHOUT CONTRAST TECHNIQUE: Multidetector CT imaging of the lumbar spine was performed without intravenous contrast administration. Multiplanar CT image reconstructions were also generated. RADIATION DOSE REDUCTION: This exam was performed according to the departmental dose-optimization program which includes automated exposure control, adjustment of the mA and/or kV according to patient size and/or use of iterative reconstruction technique. COMPARISON:  Prior study from 07/29/2012. FINDINGS: Segmentation: Standard. Lowest well-formed disc space labeled the L5-S1 level. Alignment: Exaggeration of the normal lumbar lordosis. No listhesis. Vertebrae: Acute compression fracture involving the superior endplate of L1 is seen. Associated height loss measures up to 35% with trace 2 mm bony retropulsion. Associated acute minimally displaced fracture of the left transverse process of L1 noted. No other acute fracture within the lumbar spine. Chronic compression fracture of T12 with up to 40% height loss with 2 mm bony retropulsion. Additional subacute to chronic bilateral posterior twelfth rib fractures noted, with incomplete union on the left. Visualized sacrum and pelvis  intact. No discrete osseous lesions. Paraspinal and other soft tissues: Paraspinous soft tissues demonstrate no acute finding. Disc levels: Mild noncompressive disc bulging noted within the lower lumbar spine at L3-4 through L5-S1. Mild-to-moderate lower lumbar facet hypertrophy. No significant spinal stenosis. Foramina remain patent. IMPRESSION: 1. Acute compression fracture involving the superior endplate of L1 with up to 35% height loss and trace 2 mm bony retropulsion. 2. Associated acute minimally displaced fracture of the left transverse process of L1. 3. Chronic compression fracture of T12 with up to 40% height loss with 2 mm bony retropulsion. 4. Additional subacute to chronic bilateral posterior twelfth rib fractures, with incomplete union on the left. Electronically Signed   By: Jeannine Boga M.D.   On: 02/16/2022 20:08     Medical Decision Making Amount and/or Complexity of Data Reviewed Labs: ordered. Radiology: ordered.  Risk Prescription drug management.    Follow up on CT scans, labs  Patient's hemoglobin is slightly low at 10.3 which is improved from her previous.  CMP is without clinically significant acute abnormalities.  Lipase is not elevated. CT scans of abdomen pelvis and L-spine are reviewed. There is slight discrepancy between reads of CT abdomen pelvis and CT L-spine regarding patient's left 12th rib fractures, she is read as having subacute to chronic bilateral posterior 12th rib fractures with incomplete union on the left on her L-spine imaging, and on her abdomen pelvis this is interpreted as a recent likely acute nondisplaced left 12th rib fracture.  Patient is not complaining of chest pain or shortness of breath. Imaging of her chest is considered, however given the number of ribs seen on the CT scan of her abdomen pelvis combined with the area of patient's pain I do not think that this would be clinically beneficial.  Neurosurgery is consulted, recommended  TLSO with outpatient follow-up in 2 weeks. On my exam patient is tearful when being told her results however is  otherwise well-appearing and does not have any discrete weakness in her bilateral legs.  She has been constipated but denies any changes to bladder function, and her constipation was ongoing prior to the fall, doubt cauda equina.  Patient is already treated by pain management as an outpatient.  She was given a dose of her home Oxy IR while in the emergency room.  We discussed incidental findings on her abdominal CT scan.  For constipation I recommended that she increase doses of MiraLAX.  We discussed how MiraLAX works. I also recommended following up with primary care and pain management specialist as I suspect that her chronic opioid use is a significant part in her ongoing constipation.  Plan is to place TLSO.  Have patient ambulate.  If she ambulates with out difficulty will anticipate discharge. Plan communicated with RN and patient.           Lorin Glass, PA-C 02/17/22 2240    Hayden Rasmussen, MD 02/18/22 [phone]

## 2022-02-17 DIAGNOSIS — S32018A Other fracture of first lumbar vertebra, initial encounter for closed fracture: Secondary | ICD-10-CM | POA: Diagnosis not present

## 2022-02-17 MED ORDER — OXYCODONE HCL 5 MG PO TABS
15.0000 mg | ORAL_TABLET | Freq: Once | ORAL | Status: AC
  Administered 2022-02-17: 15 mg via ORAL
  Filled 2022-02-17: qty 3

## 2022-02-23 ENCOUNTER — Ambulatory Visit (HOSPITAL_COMMUNITY): Payer: Medicare Other

## 2022-02-23 ENCOUNTER — Inpatient Hospital Stay (HOSPITAL_COMMUNITY): Payer: Medicare Other

## 2022-03-06 ENCOUNTER — Emergency Department (HOSPITAL_COMMUNITY): Payer: Medicare Other

## 2022-03-06 ENCOUNTER — Inpatient Hospital Stay (HOSPITAL_COMMUNITY)
Admission: EM | Admit: 2022-03-06 | Discharge: 2022-03-13 | DRG: 871 | Disposition: A | Discharge status: Home / Self Care | Payer: Medicare Other | Attending: Internal Medicine | Admitting: Internal Medicine

## 2022-03-06 ENCOUNTER — Encounter (HOSPITAL_COMMUNITY): Payer: Self-pay | Admitting: Emergency Medicine

## 2022-03-06 ENCOUNTER — Other Ambulatory Visit: Payer: Self-pay

## 2022-03-06 DIAGNOSIS — K861 Other chronic pancreatitis: Secondary | ICD-10-CM | POA: Diagnosis present

## 2022-03-06 DIAGNOSIS — R339 Retention of urine, unspecified: Secondary | ICD-10-CM | POA: Diagnosis present

## 2022-03-06 DIAGNOSIS — I10 Essential (primary) hypertension: Secondary | ICD-10-CM | POA: Diagnosis not present

## 2022-03-06 DIAGNOSIS — K56600 Partial intestinal obstruction, unspecified as to cause: Secondary | ICD-10-CM | POA: Diagnosis present

## 2022-03-06 DIAGNOSIS — E875 Hyperkalemia: Secondary | ICD-10-CM | POA: Diagnosis present

## 2022-03-06 DIAGNOSIS — R1084 Generalized abdominal pain: Secondary | ICD-10-CM | POA: Diagnosis present

## 2022-03-06 DIAGNOSIS — E039 Hypothyroidism, unspecified: Secondary | ICD-10-CM | POA: Diagnosis present

## 2022-03-06 DIAGNOSIS — E872 Acidosis, unspecified: Secondary | ICD-10-CM | POA: Diagnosis present

## 2022-03-06 DIAGNOSIS — J9601 Acute respiratory failure with hypoxia: Secondary | ICD-10-CM | POA: Diagnosis present

## 2022-03-06 DIAGNOSIS — F419 Anxiety disorder, unspecified: Secondary | ICD-10-CM | POA: Diagnosis present

## 2022-03-06 DIAGNOSIS — A419 Sepsis, unspecified organism: Secondary | ICD-10-CM | POA: Diagnosis present

## 2022-03-06 DIAGNOSIS — D62 Acute posthemorrhagic anemia: Secondary | ICD-10-CM

## 2022-03-06 DIAGNOSIS — F319 Bipolar disorder, unspecified: Secondary | ICD-10-CM | POA: Diagnosis present

## 2022-03-06 DIAGNOSIS — R6521 Severe sepsis with septic shock: Secondary | ICD-10-CM | POA: Diagnosis present

## 2022-03-06 DIAGNOSIS — I110 Hypertensive heart disease with heart failure: Secondary | ICD-10-CM | POA: Diagnosis present

## 2022-03-06 DIAGNOSIS — K56609 Unspecified intestinal obstruction, unspecified as to partial versus complete obstruction: Secondary | ICD-10-CM | POA: Diagnosis not present

## 2022-03-06 DIAGNOSIS — K567 Ileus, unspecified: Secondary | ICD-10-CM | POA: Diagnosis present

## 2022-03-06 DIAGNOSIS — I4510 Unspecified right bundle-branch block: Secondary | ICD-10-CM | POA: Diagnosis present

## 2022-03-06 DIAGNOSIS — N179 Acute kidney failure, unspecified: Secondary | ICD-10-CM | POA: Diagnosis present

## 2022-03-06 DIAGNOSIS — G8929 Other chronic pain: Secondary | ICD-10-CM | POA: Diagnosis present

## 2022-03-06 DIAGNOSIS — Z9049 Acquired absence of other specified parts of digestive tract: Secondary | ICD-10-CM

## 2022-03-06 DIAGNOSIS — Z90710 Acquired absence of both cervix and uterus: Secondary | ICD-10-CM

## 2022-03-06 DIAGNOSIS — E119 Type 2 diabetes mellitus without complications: Secondary | ICD-10-CM

## 2022-03-06 DIAGNOSIS — J188 Other pneumonia, unspecified organism: Secondary | ICD-10-CM | POA: Diagnosis present

## 2022-03-06 DIAGNOSIS — Z20822 Contact with and (suspected) exposure to covid-19: Secondary | ICD-10-CM | POA: Diagnosis present

## 2022-03-06 DIAGNOSIS — Z833 Family history of diabetes mellitus: Secondary | ICD-10-CM

## 2022-03-06 DIAGNOSIS — I5033 Acute on chronic diastolic (congestive) heart failure: Secondary | ICD-10-CM | POA: Diagnosis not present

## 2022-03-06 DIAGNOSIS — K920 Hematemesis: Secondary | ICD-10-CM | POA: Diagnosis present

## 2022-03-06 DIAGNOSIS — E860 Dehydration: Secondary | ICD-10-CM | POA: Diagnosis present

## 2022-03-06 DIAGNOSIS — K921 Melena: Secondary | ICD-10-CM | POA: Diagnosis present

## 2022-03-06 DIAGNOSIS — E538 Deficiency of other specified B group vitamins: Secondary | ICD-10-CM | POA: Diagnosis present

## 2022-03-06 DIAGNOSIS — J439 Emphysema, unspecified: Secondary | ICD-10-CM | POA: Diagnosis present

## 2022-03-06 DIAGNOSIS — Z825 Family history of asthma and other chronic lower respiratory diseases: Secondary | ICD-10-CM

## 2022-03-06 DIAGNOSIS — E1140 Type 2 diabetes mellitus with diabetic neuropathy, unspecified: Secondary | ICD-10-CM | POA: Diagnosis present

## 2022-03-06 DIAGNOSIS — Z888 Allergy status to other drugs, medicaments and biological substances status: Secondary | ICD-10-CM

## 2022-03-06 DIAGNOSIS — E785 Hyperlipidemia, unspecified: Secondary | ICD-10-CM | POA: Diagnosis present

## 2022-03-06 DIAGNOSIS — F17210 Nicotine dependence, cigarettes, uncomplicated: Secondary | ICD-10-CM | POA: Diagnosis present

## 2022-03-06 DIAGNOSIS — Z880 Allergy status to penicillin: Secondary | ICD-10-CM

## 2022-03-06 DIAGNOSIS — Z881 Allergy status to other antibiotic agents status: Secondary | ICD-10-CM

## 2022-03-06 DIAGNOSIS — Z79899 Other long term (current) drug therapy: Secondary | ICD-10-CM

## 2022-03-06 DIAGNOSIS — R0609 Other forms of dyspnea: Secondary | ICD-10-CM | POA: Diagnosis not present

## 2022-03-06 DIAGNOSIS — J189 Pneumonia, unspecified organism: Secondary | ICD-10-CM | POA: Diagnosis not present

## 2022-03-06 DIAGNOSIS — I959 Hypotension, unspecified: Secondary | ICD-10-CM

## 2022-03-06 DIAGNOSIS — Z8249 Family history of ischemic heart disease and other diseases of the circulatory system: Secondary | ICD-10-CM

## 2022-03-06 LAB — HEPATIC FUNCTION PANEL
ALT: 27 U/L (ref 0–44)
AST: 41 U/L (ref 15–41)
Albumin: 3.6 g/dL (ref 3.5–5.0)
Alkaline Phosphatase: 97 U/L (ref 38–126)
Bilirubin, Direct: 0.1 mg/dL (ref 0.0–0.2)
Indirect Bilirubin: 0.4 mg/dL (ref 0.3–0.9)
Total Bilirubin: 0.5 mg/dL (ref 0.3–1.2)
Total Protein: 7.4 g/dL (ref 6.5–8.1)

## 2022-03-06 LAB — BASIC METABOLIC PANEL
Anion gap: 19 — ABNORMAL HIGH (ref 5–15)
BUN: 51 mg/dL — ABNORMAL HIGH (ref 6–20)
CO2: 18 mmol/L — ABNORMAL LOW (ref 22–32)
Calcium: 7.8 mg/dL — ABNORMAL LOW (ref 8.9–10.3)
Chloride: 96 mmol/L — ABNORMAL LOW (ref 98–111)
Creatinine, Ser: 2.77 mg/dL — ABNORMAL HIGH (ref 0.44–1.00)
GFR, Estimated: 20 mL/min — ABNORMAL LOW (ref >60)
Glucose, Bld: 131 mg/dL — ABNORMAL HIGH (ref 70–99)
Potassium: 5.9 mmol/L — ABNORMAL HIGH (ref 3.5–5.1)
Sodium: 133 mmol/L — ABNORMAL LOW (ref 135–145)

## 2022-03-06 LAB — CBC WITH DIFFERENTIAL/PLATELET
Abs Immature Granulocytes: 1.4 10*3/uL — ABNORMAL HIGH (ref 0.00–0.07)
Band Neutrophils: 28 %
Basophils Absolute: 0 10*3/uL (ref 0.0–0.1)
Basophils Relative: 0 %
Eosinophils Absolute: 0 10*3/uL (ref 0.0–0.5)
Eosinophils Relative: 0 %
HCT: 37.8 % (ref 36.0–46.0)
Hemoglobin: 11.8 g/dL — ABNORMAL LOW (ref 12.0–15.0)
Lymphocytes Relative: 5 %
Lymphs Abs: 0.7 10*3/uL (ref 0.7–4.0)
MCH: 28.3 pg (ref 26.0–34.0)
MCHC: 31.2 g/dL (ref 30.0–36.0)
MCV: 90.6 fL (ref 80.0–100.0)
Metamyelocytes Relative: 8 %
Monocytes Absolute: 0.4 10*3/uL (ref 0.1–1.0)
Monocytes Relative: 3 %
Myelocytes: 2 %
Neutro Abs: 11.6 10*3/uL — ABNORMAL HIGH (ref 1.7–7.7)
Neutrophils Relative %: 54 %
Platelets: 322 10*3/uL (ref 150–400)
RBC: 4.17 MIL/uL (ref 3.87–5.11)
RDW: 22.8 % — ABNORMAL HIGH (ref 11.5–15.5)
WBC: 14.2 10*3/uL — ABNORMAL HIGH (ref 4.0–10.5)
nRBC: 0 % (ref 0.0–0.2)

## 2022-03-06 LAB — URINALYSIS, ROUTINE W REFLEX MICROSCOPIC
Bilirubin Urine: NEGATIVE
Glucose, UA: NEGATIVE mg/dL
Hgb urine dipstick: NEGATIVE
Ketones, ur: NEGATIVE mg/dL
Nitrite: NEGATIVE
Protein, ur: 30 mg/dL — AB
Specific Gravity, Urine: 1.026 (ref 1.005–1.030)
pH: 5 (ref 5.0–8.0)

## 2022-03-06 LAB — PROTIME-INR
INR: 0.8 (ref 0.8–1.2)
Prothrombin Time: 11.4 seconds (ref 11.4–15.2)

## 2022-03-06 LAB — LIPASE, BLOOD: Lipase: 21 U/L (ref 11–51)

## 2022-03-06 LAB — PROCALCITONIN: Procalcitonin: 18.27 ng/mL

## 2022-03-06 LAB — LACTIC ACID, PLASMA
Lactic Acid, Venous: 2.9 mmol/L (ref 0.5–1.9)
Lactic Acid, Venous: 1.8 mmol/L (ref 0.5–1.9)

## 2022-03-06 LAB — HEMOGLOBIN AND HEMATOCRIT, BLOOD
HCT: 34.7 % — ABNORMAL LOW (ref 36.0–46.0)
Hemoglobin: 10 g/dL — ABNORMAL LOW (ref 12.0–15.0)

## 2022-03-06 LAB — TYPE AND SCREEN
ABO/RH(D): A POS
Antibody Screen: NEGATIVE

## 2022-03-06 MED ORDER — VANCOMYCIN HCL 750 MG/150ML IV SOLN
750.0000 mg | INTRAVENOUS | Status: DC
Start: 2022-03-08 — End: 2022-03-08

## 2022-03-06 MED ORDER — FENTANYL CITRATE (PF) 100 MCG/2ML IJ SOLN
100.0000 ug | Freq: Once | INTRAMUSCULAR | Status: AC
  Administered 2022-03-06: 100 ug via INTRAVENOUS
  Filled 2022-03-06: qty 2

## 2022-03-06 MED ORDER — FENTANYL CITRATE PF 50 MCG/ML IJ SOSY
25.0000 ug | PREFILLED_SYRINGE | Freq: Once | INTRAMUSCULAR | Status: AC
  Administered 2022-03-06: 25 ug via INTRAVENOUS
  Filled 2022-03-06: qty 1

## 2022-03-06 MED ORDER — SODIUM CHLORIDE 0.9 % IV SOLN
2.0000 g | INTRAVENOUS | Status: DC
  Administered 2022-03-06 – 2022-03-07 (×2): 2 g via INTRAVENOUS
  Filled 2022-03-06 (×2): qty 12.5

## 2022-03-06 MED ORDER — SODIUM CHLORIDE 0.9 % IV SOLN: INTRAVENOUS | Status: DC

## 2022-03-06 MED ORDER — KETOROLAC TROMETHAMINE 15 MG/ML IJ SOLN
15.0000 mg | Freq: Once | INTRAMUSCULAR | Status: AC
  Administered 2022-03-06: 15 mg via INTRAVENOUS
  Filled 2022-03-06: qty 1

## 2022-03-06 MED ORDER — METHYLPREDNISOLONE SODIUM SUCC 125 MG IJ SOLR
125.0000 mg | Freq: Once | INTRAMUSCULAR | Status: AC
  Administered 2022-03-06: 125 mg via INTRAVENOUS
  Filled 2022-03-06: qty 2

## 2022-03-06 MED ORDER — LACTATED RINGERS IV BOLUS
1000.0000 mL | Freq: Once | INTRAVENOUS | Status: AC
  Administered 2022-03-06: 1000 mL via INTRAVENOUS

## 2022-03-06 MED ORDER — METHYLPREDNISOLONE SODIUM SUCC 125 MG IJ SOLR
120.0000 mg | Freq: Every day | INTRAMUSCULAR | Status: DC
  Administered 2022-03-07: 120 mg via INTRAVENOUS
  Filled 2022-03-06: qty 2

## 2022-03-06 MED ORDER — CALCIUM GLUCONATE-NACL 2-0.675 GM/100ML-% IV SOLN
2.0000 g | Freq: Once | INTRAVENOUS | Status: DC
  Filled 2022-03-06: qty 100

## 2022-03-06 MED ORDER — PANTOPRAZOLE SODIUM 40 MG IV SOLR
40.0000 mg | Freq: Once | INTRAVENOUS | Status: AC
  Administered 2022-03-06: 40 mg via INTRAVENOUS
  Filled 2022-03-06: qty 10

## 2022-03-06 MED ORDER — NOREPINEPHRINE 4 MG/250ML-% IV SOLN
0.0000 ug/min | INTRAVENOUS | Status: DC
  Administered 2022-03-06: 2 ug/min via INTRAVENOUS
  Filled 2022-03-06: qty 250

## 2022-03-06 MED ORDER — SODIUM CHLORIDE 0.9 % IV BOLUS
1000.0000 mL | Freq: Once | INTRAVENOUS | Status: AC
  Administered 2022-03-06: 1000 mL via INTRAVENOUS

## 2022-03-06 MED ORDER — VANCOMYCIN HCL IN DEXTROSE 1-5 GM/200ML-% IV SOLN
1000.0000 mg | Freq: Once | INTRAVENOUS | Status: AC
  Administered 2022-03-06: 1000 mg via INTRAVENOUS
  Filled 2022-03-06: qty 200

## 2022-03-06 MED ORDER — MILK AND MOLASSES ENEMA
1.0000 | Freq: Once | RECTAL | Status: DC
Start: 2022-03-06 — End: 2022-03-07
  Filled 2022-03-06: qty 240

## 2022-03-06 NOTE — ED Triage Notes (Addendum)
Pt presents with generalized pain all over, currently in TSO brace, per pt pain started last night, but also hasn't voided since last night. Last dose of pain med oxycodone was at 10 am.

## 2022-03-06 NOTE — ED Notes (Signed)
RN at bedside obtaining ultrasound IV, second set of cultures drawn before antibiotic administration

## 2022-03-06 NOTE — H&P (Signed)
History and Physical    Patient: [name] DOB: 1971-02-01 DOA: 03/06/2022 DOS: the patient was seen and examined on 03/06/2022 PCP: Redmond School, MD  Patient coming from: Home  Chief Complaint:  Chief Complaint  Patient presents with   Back Pain   HPI: [name] is a 51 y.o. female with medical history significant of type 2 diabetes, hypothyroidism, COPD/asthma, hyperlipidemia, chronic pancreatitis who presents with generalized body aches, upper abdominal pain.  The patient was recently seen in the emergency department approximately 2 weeks ago and was diagnosed with a L1 compression fracture and discharged home with a TLSO brace and pain medicine.  Her pain has been fairly well controlled until this morning when she started to have diffuse body aches, nausea, hematemesis.  Due to worsening symptoms, the patient came to the hospital for evaluation.  The patient feels mild shortness of breath.  She has been having severe constipation and states that she has not had a bowel movement since she had the compression fracture.  She also states that she is having a hard time urinating.  In the emergency department she was found to be hypotensive.  This persisted despite IV fluid boluses.  She was started on Levophed with improvement on minimal amounts.  She was started on broad-spectrum antibiotics and got blood cultures.  Review of Systems: As mentioned in the history of present illness. All other systems reviewed and are negative. Past Medical History:  Diagnosis Date   Anemia    Anxiety    Asthma    Bipolar 1 disorder ([place])    Chronic abdominal pain    COPD (chronic obstructive pulmonary disease) ([place])    Depression    Diabetes mellitus    Diabetic neuropathy ([place]) 10/29/2017   Esophagitis    Hiatal hernia    Hyperlipidemia 02/03/2012   Iron deficiency anemia due to chronic blood loss 01/29/2022   Migraine    Neck pain 06/08/2012   Pancreatitis chronic  Idiopathic   Treated by Dr[facility] Newman Pies at [facility] in the past with celiac blocks.   Past Surgical History:  Procedure Laterality Date   ABDOMINAL HYSTERECTOMY     attempted colonoscopy  02/2017   Dr. Newman Pies: Poor prep, scope passed to mid transverse colon before colonoscopy aborted.  No significant findings noted to mid transverse colon.  Next colonoscopy in 2 years.   CELIAC PLEXUS BLOCK     CHOLECYSTECTOMY     COLONOSCOPY N/A 12/05/2012   HWE:[id number] rectum, colon and terminal ileum   ESOPHAGOGASTRODUODENOSCOPY  02/20/2008   RCV:[id number] distal esophageal mucosa, suspicious for neoplasm/Hiatal hernia otherwise normal    ESOPHAGOGASTRODUODENOSCOPY  04/03/2008   ZWC:[id number] hiatal hernia, otherwise normal/Short, tight, benign-appearing peptic stricture   ESOPHAGOGASTRODUODENOSCOPY  November 16, 2012   Dr. Britta Mccreedy: hiatal hernia, esophagitis,    ESOPHAGOGASTRODUODENOSCOPY (EGD) WITH PROPOFOL N/A 05/09/2019   Dr. Oneida Alar: Large hiatal hernia with erythema and edema in the pouch, mild gastritis.  No biopsies taken.   ESOPHAGOGASTRODUODENOSCOPY (EGD) WITH PROPOFOL N/A 05/26/2019   Procedure: ESOPHAGOGASTRODUODENOSCOPY (EGD) WITH PROPOFOL;  Surgeon: Danie Binder, MD;  [facility];  Service: Endoscopy;  Laterality: N/A;   EUS  12/2018   Dr. Newman Pies: LA grade B esophagitis, antritis but negative H. pylori, 1 cm ulcer at the duodenal sweep   GIVENS CAPSULE STUDY N/A 12/05/2012   Few superficial erosions but nothing found to explain iron deficiency anemia.    GIVENS CAPSULE STUDY N/A 05/26/2019   Procedure: GIVENS CAPSULE  STUDY;  Surgeon: Danie Binder, MD;  Location: [facility];  Service: Endoscopy;  Laterality: N/A;   Ileocolonoscopy  06/05/2008     GDJ:MEQASTM anal canal, otherwise normal rectum, colon   TONSILLECTOMY     Social History:  reports that she has been smoking cigarettes. She has a 25.00 pack-year smoking history. She has been exposed to tobacco smoke.  She has never used smokeless tobacco. She reports that she does not drink alcohol and does not use drugs.  Allergies  Allergen Reactions   Penicillins Shortness Of Breath     Has patient had a PCN reaction causing immediate rash, facial/tongue/throat swelling, SOB or lightheadedness with hypotension: Yes Has patient had a PCN reaction causing severe rash involving mucus membranes or skin necrosis: Yes Has patient had a PCN reaction that required hospitalization No Has patient had a PCN reaction occurring within the last 10 years: No  If all of the above answers are "NO", then may proceed with Cephalosporin use. Tolerated ceftriaxone   Sulfa Antibiotics Shortness Of Breath   Aspirin Nausea And Vomiting   Enoxaparin Other (See Comments)    Has been known to cause her blood clots in her legs Has been known to cause her blood clots in her legs   Lovenox  [Enoxaparin Sodium] Other (See Comments)    Has been known to cause her blood clots in her legs   Prednisone Other (See Comments)    Pancreatitis, but has to take for asthma sometimes    Family History  Problem Relation Age of Onset   Asthma Other    Asthma Paternal Grandfather    Heart attack Paternal Grandfather    Hypertension Mother    Cancer Maternal Grandmother    Diabetes Paternal Grandmother    Coronary artery disease Neg Hx    Colon cancer Neg Hx     Prior to Admission medications   Medication Sig Start Date End Date Taking? Authorizing Provider  albuterol (PROVENTIL HFA;VENTOLIN HFA) 108 (90 BASE) MCG/ACT inhaler Inhale 2 puffs into the lungs daily as needed for wheezing. For shortness of breath   Yes [provider]  albuterol (PROVENTIL) (2.5 MG/3ML) 0.083% nebulizer solution Take 2.5 mg by nebulization every 6 (six) hours as needed for wheezing or shortness of breath.  05/24/19  Yes [provider]  amitriptyline (ELAVIL) 100 MG tablet Take 100 mg by mouth at bedtime.   Yes [provider]   atorvastatin (LIPITOR) 20 MG tablet Take 20 mg by mouth daily.   Yes [provider]  bisacodyl (DULCOLAX) 5 MG EC tablet Take 1 tablet (5 mg total) by mouth 2 (two) times daily. 10/09/19  Yes Wieters, Hallie C, PA-C  budesonide-formoterol (SYMBICORT) 80-4.5 MCG/ACT inhaler Inhale 2 puffs into the lungs 2 (two) times daily. Patient taking differently: Inhale 2 puffs into the lungs daily. 03/30/14  Yes Samuella Cota, MD  busPIRone (BUSPAR) 10 MG tablet Take 10 mg by mouth 3 (three) times daily.   Yes [provider]  citalopram (CELEXA) 40 MG tablet Take 40 mg by mouth daily. 05/17/19  Yes [provider]  cyclobenzaprine (FLEXERIL) 5 MG tablet Take 5 mg by mouth 3 (three) times daily as needed for muscle spasms. 02/13/22  Yes [provider]  Docusate Sodium (DSS) 100 MG CAPS Take 1 capsule by mouth at bedtime.   Yes [provider]  ferrous sulfate 325 (65 FE) MG tablet Take 325 mg by mouth daily.   Yes [provider]  furosemide (LASIX) 20 MG tablet Take 20 mg by mouth daily. 02/19/20  Yes [provider]  gabapentin (NEURONTIN) 300 MG capsule Take 300 mg by mouth 3 (three) times daily.  04/11/19  Yes [provider]  levothyroxine (SYNTHROID, LEVOTHROID) 25 MCG tablet Take 25 mcg by mouth daily before breakfast.   Yes [provider]  lisinopril (PRINIVIL,ZESTRIL) 2.5 MG tablet Take 2.5 mg by mouth daily.  01/18/18  Yes [provider]  magnesium oxide (MAG-OX) 400 (240 Mg) MG tablet Take 1 tablet (400 mg total) by mouth 2 (two) times daily. 08/26/21  Yes Tat, Shanon Brow, MD  metFORMIN (GLUCOPHAGE) 500 MG tablet Take 2 tablets by mouth 2 (two) times daily.   Yes [provider]  methocarbamol (ROBAXIN) 500 MG tablet Take 500 mg by mouth 3 (three) times daily.   Yes [provider]  ondansetron (ZOFRAN) 4 MG tablet Take 4 mg by mouth every 8 (eight) hours as needed for vomiting or nausea. 06/02/19   Yes [provider]  oxyCODONE (ROXICODONE) 15 MG immediate release tablet Take 15 mg by mouth 4 (four) times daily.  05/22/19  Yes [provider]  OXYGEN Inhale 3 L into the lungs daily as needed (for shortness of breath).   Yes [provider]  Pancrelipase, Lip-Prot-Amyl, (ZENPEP) 20000-63000 units CPEP Take 1 capsule by mouth 3 times a day with meals and 1 capsules with snacks. 09/11/19  Yes [provider]  pantoprazole (PROTONIX) 40 MG tablet Take 40 mg by mouth 2 (two) times daily.   Yes [provider]  polyethylene glycol powder (GLYCOLAX/MIRALAX) powder Take 17 g by mouth 2 (two) times daily. Until daily soft stools  OTC Patient taking differently: Take 17 g by mouth as needed for mild constipation. Until daily soft stools  OTC 03/05/15  Yes Nona Dell, PA-C  SPIRIVA HANDIHALER 18 MCG inhalation capsule Place 18 mcg into inhaler and inhale daily.  04/11/19  Yes [provider]  valACYclovir (VALTREX) 500 MG tablet Take 500 mg by mouth daily.   Yes [provider]  azithromycin (ZITHROMAX) 500 MG tablet Take 1 tablet (500 mg total) by mouth daily. Patient not taking: Reported on 03/06/2022 08/27/21   Orson Eva, MD  cefdinir (OMNICEF) 300 MG capsule Take 1 capsule (300 mg total) by mouth every 12 (twelve) hours. Patient not taking: Reported on 03/06/2022 08/26/21   Orson Eva, MD  mupirocin cream (BACTROBAN) 2 % Apply 1 application topically daily. Apply to right 2nd toe and left great toe once daily until healed. 03/13/20   Marzetta Board, DPM    Physical Exam: Vitals:   03/06/22 2050 03/06/22 2100 03/06/22 2140 03/06/22 2154  BP: 103/60 (!) 103/58 (!) 104/56 (!) 105/58  Pulse: (!) 103 (!) 104 (!) 106 (!) 103  Resp: '13 18 12 17  '$ Temp:    99 F (37.2 C)  TempSrc:    Oral  SpO2: 94% 96% 96% 100%  Weight:      Height:       General: Middle-age female. Awake and alert and oriented x3. No acute  cardiopulmonary distress.  HEENT: Normocephalic atraumatic.  Right and left ears normal in appearance.  Pupils equal, round, reactive to light. Extraocular muscles are intact. Sclerae anicteric and noninjected.  Moist mucosal membranes. No mucosal lesions.  Neck: Neck supple without lymphadenopathy. No carotid bruits. No masses palpated.  Cardiovascular: Regular rate with normal S1-S2 sounds. No murmurs, rubs, gallops auscultated. No JVD.  Respiratory: Rales in bases bilaterally.  Nonlabored breathing with no accessory muscle use. Abdomen: Soft, mild upper abdominal discomfort without rebound or guarding. Nondistended. Active bowel sounds. No masses or hepatosplenomegaly  Skin: No rashes, lesions, or ulcerations.  Dry, warm to touch. 2+ dorsalis pedis and radial pulses. Musculoskeletal: No calf or leg pain. All major joints not erythematous nontender.  No upper or lower joint deformation.  Good ROM.  No contractures  Psychiatric: Intact judgment and insight. Pleasant and cooperative. Neurologic: No focal neurological deficits. Strength is 5/5 and symmetric in upper and lower extremities.  Cranial nerves II through XII are grossly intact.  Data Reviewed: Results for orders placed or performed during the hospital encounter of 03/06/22 (from the past 24 hour(s))  Urinalysis, Routine w reflex microscopic Urine, Clean Catch     Status: Abnormal   Collection Time: 03/06/22  2:31 PM  Result Value Ref Range   Color, Urine YELLOW YELLOW   APPearance CLOUDY (A) CLEAR   Specific Gravity, Urine 1.026 1.005 - 1.030   pH 5.0 5.0 - 8.0   Glucose, UA NEGATIVE NEGATIVE mg/dL   Hgb urine dipstick NEGATIVE NEGATIVE   Bilirubin Urine NEGATIVE NEGATIVE   Ketones, ur NEGATIVE NEGATIVE mg/dL   Protein, ur 30 (A) NEGATIVE mg/dL   Nitrite NEGATIVE NEGATIVE   Leukocytes,Ua MODERATE (A) NEGATIVE   RBC / HPF 6-10 0 - 5 RBC/hpf   WBC, UA 11-20 0 - 5 WBC/hpf   Bacteria, UA RARE (A) NONE SEEN   Squamous Epithelial /  LPF 21-50 0 - 5   WBC Clumps PRESENT    Mucus PRESENT    Budding Yeast PRESENT    Hyaline Casts, UA PRESENT   CBC with Differential     Status: Abnormal   Collection Time: 03/06/22  2:32 PM  Result Value Ref Range   WBC 14.2 (H) 4.0 - 10.5 K/uL   RBC 4.17 3.87 - 5.11 MIL/uL   Hemoglobin 11.8 (L) 12.0 - 15.0 g/dL   HCT 37.8 36.0 - 46.0 %   MCV 90.6 80.0 - 100.0 fL   MCH 28.3 26.0 - 34.0 pg   MCHC 31.2 30.0 - 36.0 g/dL   RDW 22.8 (H) 11.5 - 15.5 %   Platelets 322 150 - 400 K/uL   nRBC 0.0 0.0 - 0.2 %   Neutrophils Relative % 54 %   Neutro Abs 11.6 (H) 1.7 - 7.7 K/uL   Band Neutrophils 28 %   Lymphocytes Relative 5 %   Lymphs Abs 0.7 0.7 - 4.0 K/uL   Monocytes Relative 3 %   Monocytes Absolute 0.4 0.1 - 1.0 K/uL   Eosinophils Relative 0 %   Eosinophils Absolute 0.0 0.0 - 0.5 K/uL   Basophils Relative 0 %   Basophils Absolute 0.0 0.0 - 0.1 K/uL   WBC Morphology DOHLE BODIES    Smear Review MORPHOLOGY UNREMARKABLE    Metamyelocytes Relative 8 %   Myelocytes 2 %   Abs Immature Granulocytes 1.40 (H) 0.00 - 0.07 K/uL   Polychromasia PRESENT   Basic metabolic panel     Status: Abnormal   Collection Time: 03/06/22  2:32 PM  Result Value Ref Range   Sodium 133 (L) 135 - 145 mmol/L   Potassium 5.9 (H) 3.5 - 5.1 mmol/L   Chloride 96 (L) 98 - 111 mmol/L   CO2 18 (L) 22 - 32 mmol/L   Glucose, Bld 131 (H) 70 - 99 mg/dL   BUN 51 (H) 6 - 20 mg/dL  Creatinine, Ser 2.77 (H) 0.44 - 1.00 mg/dL   Calcium 7.8 (L) 8.9 - 10.3 mg/dL   GFR, Estimated 20 (L) >60 mL/min   Anion gap 19 (H) 5 - 15  Lactic acid, plasma     Status: Abnormal   Collection Time: 03/06/22  2:53 PM  Result Value Ref Range   Lactic Acid, Venous 2.9 (HH) 0.5 - 1.9 mmol/L  Culture, blood (Routine x 2)     Status: None (Preliminary result)   Collection Time: 03/06/22  2:53 PM   Specimen: Blood  Result Value Ref Range   Specimen Description      BLOOD RIGHT ARM BOTTLES DRAWN AEROBIC AND ANAEROBIC   Special Requests       Blood Culture adequate volume Performed at [facility], [address], [place], [place] [address]    Culture PENDING    Report Status PENDING   Hepatic function panel     Status: None   Collection Time: 03/06/22  4:17 PM  Result Value Ref Range   Total Protein 7.4 6.5 - 8.1 g/dL   Albumin 3.6 3.5 - 5.0 g/dL   AST 41 15 - 41 U/L   ALT 27 0 - 44 U/L   Alkaline Phosphatase 97 38 - 126 U/L   Total Bilirubin 0.5 0.3 - 1.2 mg/dL   Bilirubin, Direct 0.1 0.0 - 0.2 mg/dL   Indirect Bilirubin 0.4 0.3 - 0.9 mg/dL  Lipase, blood     Status: None   Collection Time: 03/06/22  4:17 PM  Result Value Ref Range   Lipase 21 11 - 51 U/L  Protime-INR     Status: None   Collection Time: 03/06/22  4:17 PM  Result Value Ref Range   Prothrombin Time 11.4 11.4 - 15.2 seconds   INR 0.8 0.8 - 1.2  Culture, blood (Routine x 2)     Status: None (Preliminary result)   Collection Time: 03/06/22  4:35 PM   Specimen: Blood  Result Value Ref Range   Specimen Description      LEFT ANTECUBITAL BOTTLES DRAWN AEROBIC AND ANAEROBIC   Special Requests      Blood Culture adequate volume Performed at [facility], [address] [place], [place] [address]    Culture PENDING    Report Status PENDING   Type and screen [facility]     Status: None   Collection Time: 03/06/22  4:35 PM  Result Value Ref Range   ABO/RH(D) A POS    Antibody Screen NEG    Sample Expiration      03/09/2022,2359 Performed at [facility], [address] [place], [place] [address]   Lactic acid, plasma     Status: None   Collection Time: 03/06/22  4:53 PM  Result Value Ref Range   Lactic Acid, Venous 1.8 0.5 - 1.9 mmol/L  Hemoglobin and hematocrit, blood     Status: Abnormal   Collection Time: 03/06/22  5:54 PM  Result Value Ref Range   Hemoglobin 10.0 (L) 12.0 - 15.0 g/dL   HCT 34.7 (L) 36.0 - 46.0 %   CT ABDOMEN PELVIS WO CONTRAST  Result Date: 03/06/2022 CLINICAL DATA:  Abdominal pain, coffee-ground emesis EXAM:  CT ABDOMEN AND PELVIS WITHOUT CONTRAST TECHNIQUE: Multidetector CT imaging of the abdomen and pelvis was performed following the standard protocol without IV contrast. RADIATION DOSE REDUCTION: This exam was performed according to the departmental dose-optimization program which includes automated exposure control, adjustment of the mA and/or kV according to  patient size and/or use of iterative reconstruction technique. COMPARISON:  02/16/2022 FINDINGS: Lower chest: Patchy multifocal airspace opacities within the lung bases, right worse than left. Normal heart size. Hepatobiliary: No focal liver abnormality is seen. Status post cholecystectomy. No biliary dilatation. Pancreas: Pancreas is atrophic.  No inflammatory changes. Spleen: Normal in size without focal abnormality. Adrenals/Urinary Tract: Adrenal glands are unremarkable. Kidneys are normal, without renal calculi, focal lesion, or hydronephrosis. Bladder is unremarkable. Stomach/Bowel: Moderate-sized hiatal hernia. Chronically thickened gastric folds within the portion of the stomach in the hernia sac. Borderline dilated loops of small bowel within the lower abdomen with air-fluid levels. There is some fecalization of small bowel content. No abrupt transition point is seen. There is a large volume of stool throughout the colon. Normal appendix in the right lower quadrant. No pericolonic inflammatory changes. Vascular/Lymphatic: Scattered aortoiliac atherosclerotic calcifications without aneurysm. No abdominopelvic lymphadenopathy. Reproductive: Status post hysterectomy. No adnexal masses. Other: No free fluid. No abdominopelvic fluid collection. No pneumoperitoneum. No abdominal wall hernia. Musculoskeletal: Healing changes are noted at the L1 superior endplate compression fracture without progressive height loss. Chronic T12 compression fracture. No new or acute bony findings. IMPRESSION: 1. Borderline dilated loops of small bowel within the lower abdomen  with air-fluid levels and some fecalization of small bowel content. No abrupt transition point is seen. Findings are suggestive of an ileus or enteritis versus developing small bowel obstruction. 2. Large volume of stool throughout the colon suggesting constipation. 3. Patchy multifocal airspace opacities within the lung bases, right worse than left, most compatible with multifocal pneumonia. 4. Moderate-sized hiatal hernia with chronically thickened gastric folds. 5. Healing changes at the L1 superior endplate compression fracture without progressive height loss. Chronic T12 compression fracture. 6. Aortic Atherosclerosis (ICD10-I70.0). Electronically Signed   By: Davina Poke D.O.   On: 03/06/2022 17:55     Assessment and Plan: No notes have been filed under this hospital service. Service: Hospitalist  Principal Problem:   Septic shock ([place]) Active Problems:   Essential hypertension   HEMATOCHEZIA   DM type 2 (diabetes mellitus, type 2) (HCC)   Hypothyroid   Multifocal pneumonia   Hyperkalemia   SBO (small bowel obstruction) (HCC)   Metabolic acidosis  Septic shock Continue Levophed.  As she is on a small dose with great response, we can continue peripherally for present Urine culture and blood culture pending Multifocal pneumonia Continue broad-spectrum antibiotics and add azithromycin We will give steroids given acute renal injury with hypotension Sputum culture Repeat CBC in the morning Urine strep antigen and Legionella antigen Small bowel obstruction N.p.o. My suspicion is that this is likely not a true small bowel obstruction given there is not a transition point and the patient has hyperkalemia.  Normally patients are hypokalemic with small bowel obstructions.  However, we will keep the patient n.p.o. for the time being  She does have a large stool burden.  Dr. Arnoldo Morale recommended milk of molasses enema to see if this will help to improve motility. Hematochezia GI to  consult PPI IV Hyperkalemia Patient bolused with IV fluids Repeat BMP to evaluate whether this is a real value Check EKG, which was admitted by EDP Give calcium gluconate I discussed the patient with Dr. Arnoldo Morale of general surgery.  The patient does not have a transition point so likely does not have a true small bowel obstruction.  If repeat BMP shows continued hyperkalemia, will treat with Lokelma. Metabolic acidosis Likely secondary to sepsis.  We will repeat BMP now.  If continues to have worsening acidosis, will need to broaden differential and get ABG. Continue IV fluids Diabetes Hold metformin Sliding scale insulin every 4 hours Hypertension Hold Lasix and antihypertensives Hypothyroid Check TSH   Advance Care Planning:   Code Status: Prior full code  Consults: Gen surgery, GI  Family Communication: None  Severity of Illness: The appropriate patient status for this patient is INPATIENT. Inpatient status is judged to be reasonable and necessary in order to provide the required intensity of service to ensure the patient's safety. The patient's presenting symptoms, physical exam findings, and initial radiographic and laboratory data in the context of their chronic comorbidities is felt to place them at high risk for further clinical deterioration. Furthermore, it is not anticipated that the patient will be medically stable for discharge from the hospital within 2 midnights of admission.   * I certify that at the point of admission it is my clinical judgment that the patient will require inpatient hospital care spanning beyond 2 midnights from the point of admission due to high intensity of service, high risk for further deterioration and high frequency of surveillance required.*  Author: Truett Mainland, DO 03/06/2022 9:59 PM  For on call review [email or web address].

## 2022-03-06 NOTE — ED Provider Notes (Cosign Needed Addendum)
[facility] EMERGENCY DEPARTMENT Provider Note   CSN: [id number] Arrival date & time: 03/06/22  1327     History Chief Complaint  Patient presents with   Back Pain    [name] is a 51 y.o. female with history of recent L1 compression fracture and pancreatitis who presents to the emergency department today for further evaluation of diffuse abdominal pain and lower back pain.  Chart review reveals that the patient was seen here in the ED on 02/16/2022 for similar symptoms.  She had a CT abdomen pelvis with contrast in addition to CT L-spine no charge.  This revealed the L1 compression fracture.  No evidence of pancreatitis at that time.  Patient under chronic pain management.  She is now complaining of worsening diffuse abdominal pain, nausea, and hematemesis.  Patient states she does have a history of ulcers that are chronic.  Does not drink alcohol.  No urinary complaints.  No focal weakness or numbness to lower extremities or bowel or bladder incontinence.  No fever or chills.   Back Pain      Home Medications Prior to Admission medications   Medication Sig Start Date End Date Taking? Authorizing Provider  albuterol (PROVENTIL HFA;VENTOLIN HFA) 108 (90 BASE) MCG/ACT inhaler Inhale 2 puffs into the lungs daily as needed for wheezing. For shortness of breath   Yes [provider]  albuterol (PROVENTIL) (2.5 MG/3ML) 0.083% nebulizer solution Take 2.5 mg by nebulization every 6 (six) hours as needed for wheezing or shortness of breath.  05/24/19  Yes [provider]  amitriptyline (ELAVIL) 100 MG tablet Take 100 mg by mouth at bedtime.   Yes [provider]  atorvastatin (LIPITOR) 20 MG tablet Take 20 mg by mouth daily.   Yes [provider]  bisacodyl (DULCOLAX) 5 MG EC tablet Take 1 tablet (5 mg total) by mouth 2 (two) times daily. 10/09/19  Yes Wieters, Hallie C, PA-C  budesonide-formoterol (SYMBICORT) 80-4.5 MCG/ACT inhaler Inhale 2 puffs into  the lungs 2 (two) times daily. Patient taking differently: Inhale 2 puffs into the lungs daily. 03/30/14  Yes Samuella Cota, MD  busPIRone (BUSPAR) 10 MG tablet Take 10 mg by mouth 3 (three) times daily.   Yes [provider]  citalopram (CELEXA) 40 MG tablet Take 40 mg by mouth daily. 05/17/19  Yes [provider]  cyclobenzaprine (FLEXERIL) 5 MG tablet Take 5 mg by mouth 3 (three) times daily as needed for muscle spasms. 02/13/22  Yes [provider]  Docusate Sodium (DSS) 100 MG CAPS Take 1 capsule by mouth at bedtime.   Yes [provider]  ferrous sulfate 325 (65 FE) MG tablet Take 325 mg by mouth daily.   Yes [provider]  furosemide (LASIX) 20 MG tablet Take 20 mg by mouth daily. 02/19/20  Yes [provider]  gabapentin (NEURONTIN) 300 MG capsule Take 300 mg by mouth 3 (three) times daily.  04/11/19  Yes [provider]  levothyroxine (SYNTHROID, LEVOTHROID) 25 MCG tablet Take 25 mcg by mouth daily before breakfast.   Yes [provider]  lisinopril (PRINIVIL,ZESTRIL) 2.5 MG tablet Take 2.5 mg by mouth daily.  01/18/18  Yes [provider]  magnesium oxide (MAG-OX) 400 (240 Mg) MG tablet Take 1 tablet (400 mg total) by mouth 2 (two) times daily. 08/26/21  Yes Tat, Shanon Brow, MD  metFORMIN (GLUCOPHAGE) 500 MG tablet Take 2 tablets by mouth 2 (two) times daily.   Yes [provider]  methocarbamol (  ROBAXIN) 500 MG tablet Take 500 mg by mouth 3 (three) times daily.   Yes [provider]  ondansetron (ZOFRAN) 4 MG tablet Take 4 mg by mouth every 8 (eight) hours as needed for vomiting or nausea. 06/02/19  Yes [provider]  oxyCODONE (ROXICODONE) 15 MG immediate release tablet Take 15 mg by mouth 4 (four) times daily.  05/22/19  Yes [provider]  OXYGEN Inhale 3 L into the lungs daily as needed (for shortness of breath).   Yes [provider]  Pancrelipase, Lip-Prot-Amyl,  (ZENPEP) 20000-63000 units CPEP Take 1 capsule by mouth 3 times a day with meals and 1 capsules with snacks. 09/11/19  Yes [provider]  pantoprazole (PROTONIX) 40 MG tablet Take 40 mg by mouth 2 (two) times daily.   Yes [provider]  polyethylene glycol powder (GLYCOLAX/MIRALAX) powder Take 17 g by mouth 2 (two) times daily. Until daily soft stools  OTC Patient taking differently: Take 17 g by mouth as needed for mild constipation. Until daily soft stools  OTC 03/05/15  Yes Nona Dell, PA-C  SPIRIVA HANDIHALER 18 MCG inhalation capsule Place 18 mcg into inhaler and inhale daily.  04/11/19  Yes [provider]  valACYclovir (VALTREX) 500 MG tablet Take 500 mg by mouth daily.   Yes [provider]  azithromycin (ZITHROMAX) 500 MG tablet Take 1 tablet (500 mg total) by mouth daily. Patient not taking: Reported on 03/06/2022 08/27/21   Orson Eva, MD  cefdinir (OMNICEF) 300 MG capsule Take 1 capsule (300 mg total) by mouth every 12 (twelve) hours. Patient not taking: Reported on 03/06/2022 08/26/21   Orson Eva, MD  mupirocin cream (BACTROBAN) 2 % Apply 1 application topically daily. Apply to right 2nd toe and left great toe once daily until healed. 03/13/20   Marzetta Board, DPM      Allergies    Penicillins, Sulfa antibiotics, Aspirin, Enoxaparin, Lovenox  [enoxaparin sodium], and Prednisone    Review of Systems   Review of Systems  Musculoskeletal:  Positive for back pain.  All other systems reviewed and are negative.   Physical Exam Updated Vital Signs BP (!) 105/58   Pulse (!) 103   Temp 99 F (37.2 C) (Oral)   Resp 17   Ht '5\' 2"'$  (1.575 m)   Wt 53.5 kg   SpO2 100%   BMI 21.58 kg/m  Physical Exam Vitals and nursing note reviewed. Exam conducted with a chaperone present.  Constitutional:      General: She is in acute distress.     Appearance: Normal appearance. She is ill-appearing.  HENT:     Head: Normocephalic and  atraumatic.  Eyes:     General:        Right eye: No discharge.        Left eye: No discharge.  Cardiovascular:     Rate and Rhythm: Tachycardia present.     Pulses: Normal pulses.     Heart sounds: Heart sounds not distant. No murmur heard. Pulmonary:     Comments: Clear to auscultation bilaterally.  Normal effort.  No respiratory distress.  No evidence of wheezes, rales, or rhonchi heard throughout. Abdominal:     General: Abdomen is flat. Bowel sounds are normal. There is no distension.     Tenderness: There is generalized abdominal tenderness. There is no guarding or rebound.  Genitourinary:    Comments: Rectal exam did not reveal any external masses or hemorrhoids.  No skin tags.  Good rectal tone.  Small amount of brown stool in the rectal vault.  No internal hemorrhoids or anal fissures. Musculoskeletal:        General: Normal range of motion.     Cervical back: Neck supple.     Comments: Tenderness over the lower lumbar midline spine.  Skin:    General: Skin is warm and dry.     Findings: No rash.  Neurological:     General: No focal deficit present.     Mental Status: She is alert.  Psychiatric:        Mood and Affect: Mood normal.        Behavior: Behavior normal.     ED Results / Procedures / Treatments   Labs (all labs ordered are listed, but only abnormal results are displayed) Labs Reviewed  URINALYSIS, ROUTINE W REFLEX MICROSCOPIC - Abnormal; Notable for the following components:      Result Value   APPearance CLOUDY (*)    Protein, ur 30 (*)    Leukocytes,Ua MODERATE (*)    Bacteria, UA RARE (*)    All other components within normal limits  CBC WITH DIFFERENTIAL/PLATELET - Abnormal; Notable for the following components:   WBC 14.2 (*)    Hemoglobin 11.8 (*)    RDW 22.8 (*)    Neutro Abs 11.6 (*)    Abs Immature Granulocytes 1.40 (*)    All other components within normal limits  BASIC METABOLIC PANEL - Abnormal; Notable for the following components:    Sodium 133 (*)    Potassium 5.9 (*)    Chloride 96 (*)    CO2 18 (*)    Glucose, Bld 131 (*)    BUN 51 (*)    Creatinine, Ser 2.77 (*)    Calcium 7.8 (*)    GFR, Estimated 20 (*)    Anion gap 19 (*)    All other components within normal limits  LACTIC ACID, PLASMA - Abnormal; Notable for the following components:   Lactic Acid, Venous 2.9 (*)    All other components within normal limits  HEMOGLOBIN AND HEMATOCRIT, BLOOD - Abnormal; Notable for the following components:   Hemoglobin 10.0 (*)    HCT 34.7 (*)    All other components within normal limits  BASIC METABOLIC PANEL - Abnormal; Notable for the following components:   BUN 42 (*)    Creatinine, Ser 1.73 (*)    Calcium 7.0 (*)    GFR, Estimated 35 (*)    All other components within normal limits  CULTURE, BLOOD (ROUTINE X 2)  CULTURE, BLOOD (ROUTINE X 2)  URINE CULTURE  RESP PANEL BY RT-PCR (FLU A&B, COVID) ARPGX2  HEPATIC FUNCTION PANEL  LIPASE, BLOOD  LACTIC ACID, PLASMA  PROTIME-INR  PROCALCITONIN  PROCALCITONIN  POC URINE PREG, ED  POC OCCULT BLOOD, ED  TYPE AND SCREEN    EKG None  Radiology CT ABDOMEN PELVIS WO CONTRAST  Result Date: 03/06/2022 CLINICAL DATA:  Abdominal pain, coffee-ground emesis EXAM: CT ABDOMEN AND PELVIS WITHOUT CONTRAST TECHNIQUE: Multidetector CT imaging of the abdomen and pelvis was performed following the standard protocol without IV contrast. RADIATION DOSE REDUCTION: This exam was performed according to the departmental dose-optimization program which includes automated exposure control, adjustment of the mA and/or kV according to patient size and/or use of iterative reconstruction technique. COMPARISON:  02/16/2022 FINDINGS: Lower chest: Patchy multifocal airspace opacities within the lung bases, right worse than left. Normal heart size. Hepatobiliary: No focal liver abnormality is seen.  Status post cholecystectomy. No biliary dilatation. Pancreas: Pancreas is atrophic.  No inflammatory  changes. Spleen: Normal in size without focal abnormality. Adrenals/Urinary Tract: Adrenal glands are unremarkable. Kidneys are normal, without renal calculi, focal lesion, or hydronephrosis. Bladder is unremarkable. Stomach/Bowel: Moderate-sized hiatal hernia. Chronically thickened gastric folds within the portion of the stomach in the hernia sac. Borderline dilated loops of small bowel within the lower abdomen with air-fluid levels. There is some fecalization of small bowel content. No abrupt transition point is seen. There is a large volume of stool throughout the colon. Normal appendix in the right lower quadrant. No pericolonic inflammatory changes. Vascular/Lymphatic: Scattered aortoiliac atherosclerotic calcifications without aneurysm. No abdominopelvic lymphadenopathy. Reproductive: Status post hysterectomy. No adnexal masses. Other: No free fluid. No abdominopelvic fluid collection. No pneumoperitoneum. No abdominal wall hernia. Musculoskeletal: Healing changes are noted at the L1 superior endplate compression fracture without progressive height loss. Chronic T12 compression fracture. No new or acute bony findings. IMPRESSION: 1. Borderline dilated loops of small bowel within the lower abdomen with air-fluid levels and some fecalization of small bowel content. No abrupt transition point is seen. Findings are suggestive of an ileus or enteritis versus developing small bowel obstruction. 2. Large volume of stool throughout the colon suggesting constipation. 3. Patchy multifocal airspace opacities within the lung bases, right worse than left, most compatible with multifocal pneumonia. 4. Moderate-sized hiatal hernia with chronically thickened gastric folds. 5. Healing changes at the L1 superior endplate compression fracture without progressive height loss. Chronic T12 compression fracture. 6. Aortic Atherosclerosis (ICD10-I70.0). Electronically Signed   By: Davina Poke D.O.   On: 03/06/2022 17:55     Procedures .Critical Care  Performed by: Hendricks Limes, PA-C Authorized by: Hendricks Limes, PA-C   Critical care provider statement:    Critical care time (minutes):  75   Critical care time was exclusive of:  Separately billable procedures and treating other patients   Critical care was necessary to treat or prevent imminent or life-threatening deterioration of the following conditions:  Sepsis and circulatory failure   Critical care was time spent personally by me on the following activities:  Ordering and performing treatments and interventions, ordering and review of laboratory studies, ordering and review of radiographic studies, pulse oximetry, re-evaluation of patient's condition and review of old charts   Care discussed with: admitting provider      Medications Ordered in ED Medications  ceFEPIme (MAXIPIME) 2 g in sodium chloride 0.9 % 100 mL IVPB (0 g Intravenous Stopped 03/06/22 1715)  vancomycin (VANCOREADY) IVPB 750 mg/150 mL (has no administration in time range)  norepinephrine (LEVOPHED) '4mg'$  in 240m (0.016 mg/mL) premix infusion (2 mcg/min Intravenous Rate/Dose Verify 03/06/22 1930)  methylPREDNISolone sodium succinate (SOLU-MEDROL) 125 mg/2 mL injection 125 mg (125 mg Intravenous Given 03/06/22 2157)    Followed by  methylPREDNISolone sodium succinate (SOLU-MEDROL) 125 mg/2 mL injection 120 mg (has no administration in time range)  0.9 %  sodium chloride infusion ( Intravenous New Bag/Given 03/06/22 2202)  milk and molasses enema (has no administration in time range)  sodium chloride 0.9 % bolus 1,000 mL (0 mLs Intravenous Stopped 03/06/22 1608)  ketorolac (TORADOL) 15 MG/ML injection 15 mg (15 mg Intravenous Given 03/06/22 1455)  pantoprazole (PROTONIX) injection 40 mg (40 mg Intravenous Given 03/06/22 1510)  lactated ringers bolus 1,000 mL (1,000 mLs Intravenous Bolus 03/06/22 1653)  vancomycin (VANCOCIN) IVPB 1000 mg/200 mL premix (0 mg Intravenous Stopped 03/06/22  1906)  fentaNYL (SUBLIMAZE) injection 25 mcg (25 mcg  Intravenous Given 03/06/22 1653)  fentaNYL (SUBLIMAZE) injection 100 mcg (100 mcg Intravenous Given 03/06/22 2200)    ED Course/ Medical Decision Making/ A&P Clinical Course as of 03/06/22 2243  Fri Mar 06, 2022  1601 CBC with Differential(!) There is evidence of leukocytosis and mild anemia. [CF]  5053 Basic metabolic panel(!) Numerous electrolyte abnormalities including hyponatremia, hyperkalemia, hypochloremia.  There is significant AKI in comparison to previous results. [CF]  1603 Urinalysis, Routine w reflex microscopic Urine, Clean Catch(!) Urine does not show any significant evidence of urinary tract infection. [CF]  1604 Lactic acid, plasma(!!) Initial lactic is 2.9.  [CF]  2007 Hepatic function panel Normal. [CF]  2007 Lipase, blood Lipase negative. [CF]  2007 Lactic acid, plasma Second lactic is 1.8. [CF]  2034 I spoke with Dr. Abbey Chatters with gastroenterology who agrees to consult on the patient.  He recommends keeping the patient n.p.o. and he will round the patient the morning. [CF]  2055 I spoke with Dr. Arnoldo Morale with general surgery who agrees for n.p.o. and bowel rest.  He recommends that if the patient begins vomiting to place an NG tube. [CF]    Clinical Course User Index [CF] Hendricks Limes, PA-C                           Medical Decision Making Prentice is a 51 y.o. female patient who presents to the emergency department with diffuse abdominal pain and back pain.  I was called into the room promptly by nursing staff as the patient did have a blood pressure in the low 80s and had O2 sats in the high 80s.  She was immediately placed on 2 L of oxygen via nasal cannula.  Patient appears uncomfortable but is alert and answers my questions appropriately.  Although patient had a CT scan couple of weeks ago I do feel that repeating it today is necessary considering her vital signs and worsening pain.  We will hold off  on narcotic pain medication until blood pressure improves.  We will start with Toradol and get a liter of fluid running.  Labs ordered.  I will plan to reassess frequently.  After multiple fluid boluses, patient was still hypotensive.  She was placed ultimately on a Levophed drip at 2 mics per minute.  This improved her blood pressure.  Started broad-spectrum antibiotics.  Patient does meet sepsis criteria.  There is evidence of multifocal pneumonia in addition to possible early small bowel obstruction versus ileus versus enteritis.  Given the clinical scenario, I do feel the patient would benefit from further evaluation in the hospital.  Vital signs have improved with fluids, Levophed.  General surgery and gastroenterology were notified as highlighted in ED course and will consult on the patient.   Amount and/or Complexity of Data Reviewed External Data Reviewed: notes. Labs: ordered. Decision-making details documented in ED Course. Radiology: ordered and independent interpretation performed. Decision-making details documented in ED Course.  Risk Prescription drug management. Decision regarding hospitalization.   Final Clinical Impression(s) / ED Diagnoses Final diagnoses:  Sepsis with acute renal failure and septic shock, due to unspecified organism, unspecified acute renal failure type ([place])  Generalized abdominal pain  Hypotension, unspecified hypotension type  Multifocal pneumonia    Rx / DC Orders ED Discharge Orders     None         Hendricks Limes, PA-C 03/06/22 2243    Myna Bright M, PA-C 03/06/22 2314  Hayden Rasmussen, MD 03/07/22 [phone]

## 2022-03-06 NOTE — ED Notes (Addendum)
NT was able to get over 800 mL of urine out of in and out urine catheter, PA-C made aware

## 2022-03-06 NOTE — ED Notes (Signed)
Phlebotomy unable to get second set of cultures, will ask certified RN in IV ultrasound to attempt a second IV access site

## 2022-03-06 NOTE — ED Notes (Signed)
PA-C made aware of pressure of 90/52

## 2022-03-06 NOTE — ED Notes (Signed)
Patient transported to CT 

## 2022-03-06 NOTE — Progress Notes (Signed)
Patient Name: [name], female   DOB: Feb 13, 1971, 51 y.o.  MRN: [id number]  BMP resulted showing resolution of hyperkalemia and metabolic acidosis.  BUN/creatinine improving.  We will hold off on calcium gluconate and Lokelma at this point   Truett Mainland, DO

## 2022-03-06 NOTE — ED Notes (Addendum)
Pt informed she could not have anything by mouth due to SBO per Dr Ernesto Rutherford. Pt says she does not want an NG tube- pt says nausea is better now, informed Dr Ernesto Rutherford- says she will talk with pt.

## 2022-03-06 NOTE — ED Notes (Signed)
Pt vomited--appearance: coffee ground emesis--PA-C made aware

## 2022-03-06 NOTE — ED Notes (Signed)
Pt. Used bedpan and got out 577m. Bladder scan shows pt still holding 2373m

## 2022-03-06 NOTE — Progress Notes (Signed)
Pharmacy Antibiotic Note  [name] a 51 y.o. female admitted on 03/06/2022 with sepsis.  Pharmacy has been consulted for vancomycin and cefepime dosing.  Plan: Vancomycin '750mg'$  IV every 12 hours.  Goal trough 15-20 mcg/mL. Cefepime 2gm IV every 24 hours.  Medical History: Past Medical History:  Diagnosis Date   Anemia    Anxiety    Asthma    Bipolar 1 disorder ([place])    Chronic abdominal pain    COPD (chronic obstructive pulmonary disease) ([facility])    Depression    Diabetes mellitus    Diabetic neuropathy ([place]) 10/29/2017   Esophagitis    Hiatal hernia    Hyperlipidemia 02/03/2012   Iron deficiency anemia due to chronic blood loss 01/29/2022   Migraine    Neck pain 06/08/2012   Pancreatitis chronic Idiopathic   Treated by Dr[facility] Newman Pies at [facility] in the past with celiac blocks.    Allergies:  Allergies  Allergen Reactions   Penicillins Shortness Of Breath     Has patient had a PCN reaction causing immediate rash, facial/tongue/throat swelling, SOB or lightheadedness with hypotension: Yes Has patient had a PCN reaction causing severe rash involving mucus membranes or skin necrosis: Yes Has patient had a PCN reaction that required hospitalization No Has patient had a PCN reaction occurring within the last 10 years: No  If all of the above answers are "NO", then may proceed with Cephalosporin use. Tolerated ceftriaxone   Sulfa Antibiotics Shortness Of Breath   Aspirin Nausea And Vomiting   Enoxaparin Other (See Comments)    Has been known to cause her blood clots in her legs Has been known to cause her blood clots in her legs   Lovenox  [Enoxaparin Sodium] Other (See Comments)    Has been known to cause her blood clots in her legs   Prednisone Other (See Comments)    Pancreatitis, but has to take for asthma sometimes    Filed Weights   03/06/22 1336  Weight: 53.5 kg (118 lb)       Latest Ref Rng & Units 03/06/2022    2:32 PM 02/16/2022    4:49 PM 01/29/2022     2:53 PM  CBC  WBC 4.0 - 10.5 K/uL 14.2  9.3  8.3   Hemoglobin 12.0 - 15.0 g/dL 11.8  10.3  9.0   Hematocrit 36.0 - 46.0 % 37.8  33.6  29.8   Platelets 150 - 400 K/uL 322  234  312      Estimated Creatinine Clearance: 18.6 mL/min (A) (by C-G formula based on SCr of 2.77 mg/dL (H)).  Antibiotics Given (last 72 hours)     None       Antimicrobials this admission:  Cefepime 03/06/2022  >>  vancomycin 03/06/2022  >>   Microbiology results: 03/06/2022  BCx: sent  Thank you for allowing pharmacy to be a part of this patient's care.  Thomasenia Sales, PharmD Clinical Pharmacist

## 2022-03-07 DIAGNOSIS — R1084 Generalized abdominal pain: Secondary | ICD-10-CM | POA: Diagnosis not present

## 2022-03-07 DIAGNOSIS — A419 Sepsis, unspecified organism: Secondary | ICD-10-CM | POA: Diagnosis present

## 2022-03-07 DIAGNOSIS — K920 Hematemesis: Secondary | ICD-10-CM | POA: Diagnosis not present

## 2022-03-07 DIAGNOSIS — D62 Acute posthemorrhagic anemia: Secondary | ICD-10-CM | POA: Diagnosis not present

## 2022-03-07 LAB — CBC
HCT: 28.2 % — ABNORMAL LOW (ref 36.0–46.0)
Hemoglobin: 8.7 g/dL — ABNORMAL LOW (ref 12.0–15.0)
MCH: 28.2 pg (ref 26.0–34.0)
MCHC: 30.9 g/dL (ref 30.0–36.0)
MCV: 91.6 fL (ref 80.0–100.0)
Platelets: 185 10*3/uL (ref 150–400)
RBC: 3.08 MIL/uL — ABNORMAL LOW (ref 3.87–5.11)
RDW: 22.7 % — ABNORMAL HIGH (ref 11.5–15.5)
WBC: 9.6 10*3/uL (ref 4.0–10.5)
nRBC: 0 % (ref 0.0–0.2)

## 2022-03-07 LAB — BASIC METABOLIC PANEL
Anion gap: 5 (ref 5–15)
BUN: 34 mg/dL — ABNORMAL HIGH (ref 6–20)
CO2: 22 mmol/L (ref 22–32)
Calcium: 7.5 mg/dL — ABNORMAL LOW (ref 8.9–10.3)
Chloride: 112 mmol/L — ABNORMAL HIGH (ref 98–111)
Creatinine, Ser: 1.11 mg/dL — ABNORMAL HIGH (ref 0.44–1.00)
GFR, Estimated: 59 mL/min — ABNORMAL LOW (ref >60)
Glucose, Bld: 133 mg/dL — ABNORMAL HIGH (ref 70–99)
Potassium: 4.8 mmol/L (ref 3.5–5.1)
Sodium: 139 mmol/L (ref 135–145)

## 2022-03-07 LAB — CBG MONITORING, ED
Glucose-Capillary: 157 mg/dL — ABNORMAL HIGH (ref 70–99)
Glucose-Capillary: 108 mg/dL — ABNORMAL HIGH (ref 70–99)
Glucose-Capillary: 204 mg/dL — ABNORMAL HIGH (ref 70–99)

## 2022-03-07 LAB — POC OCCULT BLOOD, ED: Fecal Occult Bld: POSITIVE — AB

## 2022-03-07 LAB — RESP PANEL BY RT-PCR (FLU A&B, COVID) ARPGX2
Influenza A by PCR: NEGATIVE
Influenza B by PCR: NEGATIVE
SARS Coronavirus 2 by RT PCR: NEGATIVE

## 2022-03-07 LAB — PHOSPHORUS: Phosphorus: 3.5 mg/dL (ref 2.5–4.6)

## 2022-03-07 LAB — MRSA NEXT GEN BY PCR, NASAL: MRSA by PCR Next Gen: NOT DETECTED

## 2022-03-07 LAB — HEMOGLOBIN A1C
Hgb A1c MFr Bld: 5.9 % — ABNORMAL HIGH (ref 4.8–5.6)
Mean Plasma Glucose: 122.63 mg/dL

## 2022-03-07 LAB — GLUCOSE, CAPILLARY
Glucose-Capillary: 173 mg/dL — ABNORMAL HIGH (ref 70–99)
Glucose-Capillary: 168 mg/dL — ABNORMAL HIGH (ref 70–99)

## 2022-03-07 LAB — TSH: TSH: 1.977 u[IU]/mL (ref 0.350–4.500)

## 2022-03-07 LAB — PROCALCITONIN: Procalcitonin: 8.58 ng/mL

## 2022-03-07 LAB — MAGNESIUM: Magnesium: 1.4 mg/dL — ABNORMAL LOW (ref 1.7–2.4)

## 2022-03-07 MED ORDER — PANTOPRAZOLE SODIUM 40 MG IV SOLR
40.0000 mg | Freq: Two times a day (BID) | INTRAVENOUS | Status: DC
  Administered 2022-03-07 – 2022-03-13 (×13): 40 mg via INTRAVENOUS
  Filled 2022-03-07 (×13): qty 10

## 2022-03-07 MED ORDER — CHLORHEXIDINE GLUCONATE CLOTH 2 % EX PADS
6.0000 | MEDICATED_PAD | Freq: Every day | CUTANEOUS | Status: DC
  Administered 2022-03-07 – 2022-03-12 (×6): 6 via TOPICAL

## 2022-03-07 MED ORDER — METHYLPREDNISOLONE SODIUM SUCC 40 MG IJ SOLR: 40.0000 mg | Freq: Two times a day (BID) | INTRAMUSCULAR | Status: DC

## 2022-03-07 MED ORDER — BUSPIRONE HCL 5 MG PO TABS
10.0000 mg | ORAL_TABLET | Freq: Three times a day (TID) | ORAL | Status: DC
  Administered 2022-03-07 – 2022-03-13 (×19): 10 mg via ORAL
  Filled 2022-03-07 (×19): qty 2

## 2022-03-07 MED ORDER — UMECLIDINIUM BROMIDE 62.5 MCG/ACT IN AEPB
1.0000 | INHALATION_SPRAY | Freq: Every day | RESPIRATORY_TRACT | Status: DC
  Administered 2022-03-07 – 2022-03-13 (×7): 1 via RESPIRATORY_TRACT
  Filled 2022-03-07: qty 7

## 2022-03-07 MED ORDER — TAMSULOSIN HCL 0.4 MG PO CAPS
0.4000 mg | ORAL_CAPSULE | Freq: Every day | ORAL | Status: DC
  Administered 2022-03-07 – 2022-03-13 (×7): 0.4 mg via ORAL
  Filled 2022-03-07 (×7): qty 1

## 2022-03-07 MED ORDER — LEVOTHYROXINE SODIUM 50 MCG PO TABS: 25.0000 ug | ORAL_TABLET | Freq: Every day | ORAL | Status: DC

## 2022-03-07 MED ORDER — INSULIN ASPART 100 UNIT/ML IJ SOLN: 0.0000 [IU] | Freq: Every day | INTRAMUSCULAR | Status: DC

## 2022-03-07 MED ORDER — PNEUMOCOCCAL 20-VAL CONJ VACC 0.5 ML IM SUSY
0.5000 mL | PREFILLED_SYRINGE | INTRAMUSCULAR | Status: DC
  Filled 2022-03-07: qty 0.5

## 2022-03-07 MED ORDER — AMITRIPTYLINE HCL 25 MG PO TABS
100.0000 mg | ORAL_TABLET | Freq: Every day | ORAL | Status: DC
  Administered 2022-03-07 – 2022-03-12 (×6): 100 mg via ORAL
  Filled 2022-03-07 (×6): qty 4

## 2022-03-07 MED ORDER — FENTANYL CITRATE PF 50 MCG/ML IJ SOSY
50.0000 ug | PREFILLED_SYRINGE | INTRAMUSCULAR | Status: DC | PRN
  Administered 2022-03-07 – 2022-03-12 (×10): 50 ug via INTRAVENOUS
  Filled 2022-03-07 (×10): qty 1

## 2022-03-07 MED ORDER — LACTULOSE 10 GM/15ML PO SOLN
30.0000 g | Freq: Once | ORAL | Status: AC
  Administered 2022-03-07: 30 g via ORAL
  Filled 2022-03-07: qty 60

## 2022-03-07 MED ORDER — INSULIN ASPART 100 UNIT/ML IJ SOLN
0.0000 [IU] | INTRAMUSCULAR | Status: DC
  Administered 2022-03-07: 3 [IU] via SUBCUTANEOUS
  Administered 2022-03-07 (×2): 2 [IU] via SUBCUTANEOUS
  Filled 2022-03-07 (×2): qty 1

## 2022-03-07 MED ORDER — BISACODYL 10 MG RE SUPP
10.0000 mg | Freq: Every day | RECTAL | Status: AC
  Administered 2022-03-08: 10 mg via RECTAL
  Filled 2022-03-07 (×2): qty 1

## 2022-03-07 MED ORDER — METHYLPREDNISOLONE SODIUM SUCC 40 MG IJ SOLR
40.0000 mg | Freq: Two times a day (BID) | INTRAMUSCULAR | Status: DC
  Administered 2022-03-07 – 2022-03-13 (×12): 40 mg via INTRAVENOUS
  Filled 2022-03-07 (×12): qty 1

## 2022-03-07 MED ORDER — SODIUM CHLORIDE 0.9 % IV SOLN
500.0000 mg | INTRAVENOUS | Status: AC
  Administered 2022-03-07 – 2022-03-13 (×7): 500 mg via INTRAVENOUS
  Filled 2022-03-07 (×7): qty 5

## 2022-03-07 MED ORDER — ONDANSETRON HCL 4 MG/2ML IJ SOLN
4.0000 mg | Freq: Four times a day (QID) | INTRAMUSCULAR | Status: DC | PRN
  Administered 2022-03-08: 4 mg via INTRAVENOUS
  Filled 2022-03-07: qty 2

## 2022-03-07 MED ORDER — FLUTICASONE FUROATE-VILANTEROL 100-25 MCG/ACT IN AEPB
1.0000 | INHALATION_SPRAY | Freq: Every day | RESPIRATORY_TRACT | Status: DC
  Administered 2022-03-07 – 2022-03-13 (×7): 1 via RESPIRATORY_TRACT
  Filled 2022-03-07: qty 28

## 2022-03-07 MED ORDER — MAGNESIUM SULFATE 4 GM/100ML IV SOLN
4.0000 g | Freq: Once | INTRAVENOUS | Status: AC
  Administered 2022-03-07: 4 g via INTRAVENOUS
  Filled 2022-03-07: qty 100

## 2022-03-07 MED ORDER — MILK AND MOLASSES ENEMA
1.0000 | Freq: Once | RECTAL | Status: DC
  Filled 2022-03-07: qty 240

## 2022-03-07 MED ORDER — LEVOTHYROXINE SODIUM 25 MCG PO TABS
25.0000 ug | ORAL_TABLET | Freq: Every day | ORAL | Status: DC
  Administered 2022-03-07 – 2022-03-13 (×7): 25 ug via ORAL
  Filled 2022-03-07 (×7): qty 1

## 2022-03-07 MED ORDER — CITALOPRAM HYDROBROMIDE 20 MG PO TABS
40.0000 mg | ORAL_TABLET | Freq: Every day | ORAL | Status: DC
  Administered 2022-03-07 – 2022-03-13 (×7): 40 mg via ORAL
  Filled 2022-03-07 (×7): qty 2

## 2022-03-07 MED ORDER — ONDANSETRON HCL 4 MG PO TABS
4.0000 mg | ORAL_TABLET | Freq: Four times a day (QID) | ORAL | Status: DC | PRN
  Administered 2022-03-07: 4 mg via ORAL
  Filled 2022-03-07: qty 1

## 2022-03-07 MED ORDER — GABAPENTIN 300 MG PO CAPS
300.0000 mg | ORAL_CAPSULE | Freq: Three times a day (TID) | ORAL | Status: DC
  Administered 2022-03-07 – 2022-03-13 (×19): 300 mg via ORAL
  Filled 2022-03-07 (×19): qty 1

## 2022-03-07 MED ORDER — ALBUTEROL SULFATE HFA 108 (90 BASE) MCG/ACT IN AERS
2.0000 | INHALATION_SPRAY | Freq: Every day | RESPIRATORY_TRACT | Status: DC | PRN
  Administered 2022-03-13: 2 via RESPIRATORY_TRACT
  Filled 2022-03-07: qty 6.7

## 2022-03-07 MED ORDER — TIOTROPIUM BROMIDE MONOHYDRATE 18 MCG IN CAPS
18.0000 ug | ORAL_CAPSULE | Freq: Every day | RESPIRATORY_TRACT | Status: DC
Start: 2022-03-07 — End: 2022-03-07

## 2022-03-07 MED ORDER — ATORVASTATIN CALCIUM 20 MG PO TABS
20.0000 mg | ORAL_TABLET | Freq: Every day | ORAL | Status: DC
  Administered 2022-03-07 – 2022-03-13 (×7): 20 mg via ORAL
  Filled 2022-03-07: qty 2
  Filled 2022-03-07 (×6): qty 1

## 2022-03-07 MED ORDER — POLYETHYLENE GLYCOL 3350 17 G PO PACK
17.0000 g | PACK | Freq: Two times a day (BID) | ORAL | Status: DC
  Administered 2022-03-07 – 2022-03-11 (×7): 17 g via ORAL
  Filled 2022-03-07 (×9): qty 1

## 2022-03-07 MED ORDER — BISACODYL 10 MG RE SUPP
10.0000 mg | Freq: Once | RECTAL | Status: AC
  Administered 2022-03-07: 10 mg via RECTAL
  Filled 2022-03-07: qty 1

## 2022-03-07 MED ORDER — METHOCARBAMOL 500 MG PO TABS
500.0000 mg | ORAL_TABLET | Freq: Three times a day (TID) | ORAL | Status: DC
  Administered 2022-03-07 – 2022-03-13 (×19): 500 mg via ORAL
  Filled 2022-03-07 (×19): qty 1

## 2022-03-07 MED ORDER — INSULIN ASPART 100 UNIT/ML IJ SOLN
0.0000 [IU] | Freq: Three times a day (TID) | INTRAMUSCULAR | Status: DC
  Administered 2022-03-08: 1 [IU] via SUBCUTANEOUS
  Administered 2022-03-08: 3 [IU] via SUBCUTANEOUS
  Administered 2022-03-08: 2 [IU] via SUBCUTANEOUS
  Administered 2022-03-09 (×2): 1 [IU] via SUBCUTANEOUS
  Administered 2022-03-09 – 2022-03-11 (×4): 2 [IU] via SUBCUTANEOUS
  Administered 2022-03-11 – 2022-03-13 (×4): 1 [IU] via SUBCUTANEOUS

## 2022-03-07 NOTE — Consult Note (Signed)
Consulting  Provider: Ranell Patrick Primary Care Physician:  Redmond School, MD Primary Gastroenterologist:  Dr. Marily Memos [facility]  Reason for Consultation: Coffee-ground emesis  HPI:  [name] is a 51 y.o. female with a past medical history of chronic pancreatitis, large hiatal hernia, type 2 diabetes, COPD/asthma, dyslipidemia, who presented to [facility], ER yesterday with chief complaint of of generalized body aches, epigastric pain, shortness of breath, multiple episodes of coffee-ground emesis.  In the ER she was found to be hypotensive despite fluid resuscitation.  Started on Levophed, broad-spectrum antibiotics, cultures taken for presumed sepsis.  CT abdomen pelvis showed patchy opacities in the lung bases consistent with multifocal pneumonia.  Showed borderline dilated loops of small bowel with equalization, large volume of stool throughout the colon, moderate size hiatal hernia with chronically thickened gastric folds.  Has been evaluated by general surgery, recommended enema to help with getting bowels moved.  Patient has been on pain medication for 2 weeks after suffering L1 compression fracture.  Has not had a bowel movement in 2 weeks she states.  Notes multiple episodes of coffee-ground emesis.  No melena hematochezia but she states she has not had a bowel movement so hard to say.  Follows with [place] with extensive work-up below.  History of chronic pancreatitis status post celiac plexus block.  Has history of obscure GI bleeding in the past.  No chronic NSAID use.  Colonoscopy November 2020 incomplete to hepatic flexure only, otherwise normal, poor prep Push enteroscopy November 2020 normal Capsule endoscopy November 2020 active bleeding in distal duodenum/proximal jejunum (by report) EUS March 2023 successful celiac plexus block; normal EGD except moderate hiatal hernia  Past Medical History:  Diagnosis Date   Anemia    Anxiety    Asthma    Bipolar 1  disorder (HCC)    Chronic abdominal pain    COPD (chronic obstructive pulmonary disease) ([place])    Depression    Diabetes mellitus    Diabetic neuropathy ([place]) 10/29/2017   Esophagitis    Hiatal hernia    Hyperlipidemia 02/03/2012   Iron deficiency anemia due to chronic blood loss 01/29/2022   Migraine    Neck pain 06/08/2012   Pancreatitis chronic Idiopathic   Treated by Dr. Newman Pies at [facility] in the past with celiac blocks.    Past Surgical History:  Procedure Laterality Date   ABDOMINAL HYSTERECTOMY     attempted colonoscopy  02/2017   Dr. Newman Pies: Poor prep, scope passed to mid transverse colon before colonoscopy aborted.  No significant findings noted to mid transverse colon.  Next colonoscopy in 2 years.   CELIAC PLEXUS BLOCK     CHOLECYSTECTOMY     COLONOSCOPY N/A 12/05/2012   IHK:[id number] rectum, colon and terminal ileum   ESOPHAGOGASTRODUODENOSCOPY  02/20/2008   GLO:[id number] distal esophageal mucosa, suspicious for neoplasm/Hiatal hernia otherwise normal    ESOPHAGOGASTRODUODENOSCOPY  04/03/2008   JOA:[id number] hiatal hernia, otherwise normal/Short, tight, benign-appearing peptic stricture   ESOPHAGOGASTRODUODENOSCOPY  November 16, 2012   Dr. Britta Mccreedy: hiatal hernia, esophagitis,    ESOPHAGOGASTRODUODENOSCOPY (EGD) WITH PROPOFOL N/A 05/09/2019   Dr. Oneida Alar: Large hiatal hernia with erythema and edema in the pouch, mild gastritis.  No biopsies taken.   ESOPHAGOGASTRODUODENOSCOPY (EGD) WITH PROPOFOL N/A 05/26/2019   Procedure: ESOPHAGOGASTRODUODENOSCOPY (EGD) WITH PROPOFOL;  Surgeon: Danie Binder, MD;  [facility];  Service: Endoscopy;  Laterality: N/A;   EUS  12/2018   Dr. Newman Pies: LA grade B esophagitis, antritis but negative H.  pylori, 1 cm ulcer at the duodenal sweep   GIVENS CAPSULE STUDY N/A 12/05/2012   Few superficial erosions but nothing found to explain iron deficiency anemia.    GIVENS CAPSULE STUDY N/A 05/26/2019   Procedure: GIVENS CAPSULE  STUDY;  Surgeon: Danie Binder, MD;  Location: [facility];  Service: Endoscopy;  Laterality: N/A;   Ileocolonoscopy  06/05/2008     XVQ:[id number] anal canal, otherwise normal rectum, colon   TONSILLECTOMY      Prior to Admission medications   Medication Sig Start Date End Date Taking? Authorizing Provider  albuterol (PROVENTIL HFA;VENTOLIN HFA) 108 (90 BASE) MCG/ACT inhaler Inhale 2 puffs into the lungs daily as needed for wheezing. For shortness of breath   Yes [provider]  albuterol (PROVENTIL) (2.5 MG/3ML) 0.083% nebulizer solution Take 2.5 mg by nebulization every 6 (six) hours as needed for wheezing or shortness of breath.  05/24/19  Yes [provider]  amitriptyline (ELAVIL) 100 MG tablet Take 100 mg by mouth at bedtime.   Yes [provider]  atorvastatin (LIPITOR) 20 MG tablet Take 20 mg by mouth daily.   Yes [provider]  bisacodyl (DULCOLAX) 5 MG EC tablet Take 1 tablet (5 mg total) by mouth 2 (two) times daily. 10/09/19  Yes Wieters, Hallie C, PA-C  budesonide-formoterol (SYMBICORT) 80-4.5 MCG/ACT inhaler Inhale 2 puffs into the lungs 2 (two) times daily. Patient taking differently: Inhale 2 puffs into the lungs daily. 03/30/14  Yes Samuella Cota, MD  busPIRone (BUSPAR) 10 MG tablet Take 10 mg by mouth 3 (three) times daily.   Yes [provider]  citalopram (CELEXA) 40 MG tablet Take 40 mg by mouth daily. 05/17/19  Yes [provider]  cyclobenzaprine (FLEXERIL) 5 MG tablet Take 5 mg by mouth 3 (three) times daily as needed for muscle spasms. 02/13/22  Yes [provider]  Docusate Sodium (DSS) 100 MG CAPS Take 1 capsule by mouth at bedtime.   Yes [provider]  ferrous sulfate 325 (65 FE) MG tablet Take 325 mg by mouth daily.   Yes [provider]  furosemide (LASIX) 20 MG tablet Take 20 mg by mouth daily. 02/19/20  Yes [provider]  gabapentin (NEURONTIN) 300 MG capsule Take  300 mg by mouth 3 (three) times daily.  04/11/19  Yes [provider]  levothyroxine (SYNTHROID, LEVOTHROID) 25 MCG tablet Take 25 mcg by mouth daily before breakfast.   Yes [provider]  lisinopril (PRINIVIL,ZESTRIL) 2.5 MG tablet Take 2.5 mg by mouth daily.  01/18/18  Yes [provider]  magnesium oxide (MAG-OX) 400 (240 Mg) MG tablet Take 1 tablet (400 mg total) by mouth 2 (two) times daily. 08/26/21  Yes Tat, Shanon Brow, MD  metFORMIN (GLUCOPHAGE) 500 MG tablet Take 2 tablets by mouth 2 (two) times daily.   Yes [provider]  methocarbamol (ROBAXIN) 500 MG tablet Take 500 mg by mouth 3 (three) times daily.   Yes [provider]  ondansetron (ZOFRAN) 4 MG tablet Take 4 mg by mouth every 8 (eight) hours as needed for vomiting or nausea. 06/02/19  Yes [provider]  oxyCODONE (ROXICODONE) 15 MG immediate release tablet Take 15 mg by mouth 4 (four) times daily.  05/22/19  Yes [provider]  OXYGEN Inhale 3 L into the lungs daily as needed (for shortness of breath).   Yes [provider]  Pancrelipase, Lip-Prot-Amyl, (ZENPEP) 20000-63000 units CPEP Take 1 capsule by mouth 3 times a  day with meals and 1 capsules with snacks. 09/11/19  Yes [provider]  pantoprazole (PROTONIX) 40 MG tablet Take 40 mg by mouth 2 (two) times daily.   Yes [provider]  polyethylene glycol powder (GLYCOLAX/MIRALAX) powder Take 17 g by mouth 2 (two) times daily. Until daily soft stools  OTC Patient taking differently: Take 17 g by mouth as needed for mild constipation. Until daily soft stools  OTC 03/05/15  Yes Nona Dell, PA-C  SPIRIVA HANDIHALER 18 MCG inhalation capsule Place 18 mcg into inhaler and inhale daily.  04/11/19  Yes [provider]  valACYclovir (VALTREX) 500 MG tablet Take 500 mg by mouth daily.   Yes [provider]  azithromycin (ZITHROMAX) 500 MG tablet Take 1 tablet (500 mg  total) by mouth daily. Patient not taking: Reported on 03/06/2022 08/27/21   Orson Eva, MD  cefdinir (OMNICEF) 300 MG capsule Take 1 capsule (300 mg total) by mouth every 12 (twelve) hours. Patient not taking: Reported on 03/06/2022 08/26/21   Orson Eva, MD  mupirocin cream (BACTROBAN) 2 % Apply 1 application topically daily. Apply to right 2nd toe and left great toe once daily until healed. 03/13/20   Marzetta Board, DPM    Current Facility-Administered Medications  Medication Dose Route Frequency Provider Last Rate Last Admin   0.9 %  sodium chloride infusion   Intravenous Continuous Truett Mainland, DO 125 mL/hr at 03/07/22 0618 New Bag at 03/07/22 0618   albuterol (VENTOLIN HFA) 108 (90 Base) MCG/ACT inhaler 2 puff  2 puff Inhalation Daily PRN Truett Mainland, DO       amitriptyline (ELAVIL) tablet 100 mg  100 mg Oral QHS Stinson, Jacob J, DO       atorvastatin (LIPITOR) tablet 20 mg  20 mg Oral Daily Truett Mainland, DO   20 mg at 03/07/22 0017   azithromycin (ZITHROMAX) 500 mg in sodium chloride 0.9 % 250 mL IVPB  500 mg Intravenous Q24H Truett Mainland, DO 250 mL/hr at 03/07/22 4944 500 mg at 03/07/22 9675   busPIRone (BUSPAR) tablet 10 mg  10 mg Oral TID Truett Mainland, DO   10 mg at 03/07/22 9163   ceFEPIme (MAXIPIME) 2 g in sodium chloride 0.9 % 100 mL IVPB  2 g Intravenous Q24H Truett Mainland, DO   Stopped at 03/06/22 1715   citalopram (CELEXA) tablet 40 mg  40 mg Oral Daily Truett Mainland, DO   40 mg at 03/07/22 0944   fentaNYL (SUBLIMAZE) injection 50 mcg  50 mcg Intravenous Q2H PRN Truett Mainland, DO   50 mcg at 03/07/22 0629   fluticasone furoate-vilanterol (BREO ELLIPTA) 100-25 MCG/ACT 1 puff  1 puff Inhalation Daily Truett Mainland, DO   1 puff at 03/07/22 0930   gabapentin (NEURONTIN) capsule 300 mg  300 mg Oral TID Truett Mainland, DO   300 mg at 03/07/22 8466   insulin aspart (novoLOG) injection 0-9 Units  0-9 Units Subcutaneous Q4H Truett Mainland, DO   2  Units at 03/07/22 0630   levothyroxine (SYNTHROID) tablet 25 mcg  25 mcg Oral Q0600 Truett Mainland, DO   25 mcg at 03/07/22 5993   methocarbamol (ROBAXIN) tablet 500 mg  500 mg Oral TID Truett Mainland, DO   500 mg at 03/07/22 5701   methylPREDNISolone sodium succinate (SOLU-MEDROL) 125 mg/2 mL injection 120 mg  120 mg Intravenous Daily Truett Mainland, DO   120 mg at  03/07/22 0923   milk and molasses enema  1 enema Rectal Once Aviva Signs, MD       norepinephrine (LEVOPHED) '4mg'$  in 251m (0.016 mg/mL) premix infusion  0-40 mcg/min Intravenous Titrated STruett Mainland DO   Paused at 03/07/22 0039   ondansetron (ZOFRAN) tablet 4 mg  4 mg Oral Q6H PRN STruett Mainland DO       Or   ondansetron ([facility] injection 4 mg  4 mg Intravenous Q6H PRN STruett Mainland DO       pantoprazole (PROTONIX) injection 40 mg  40 mg Intravenous Q12H STruett Mainland DO   40 mg at 03/07/22 01610  tamsulosin (FLOMAX) capsule 0.4 mg  0.4 mg Oral Daily Emokpae, Courage, MD   0.4 mg at 03/07/22 0944   umeclidinium bromide (INCRUSE ELLIPTA) 62.5 MCG/ACT 1 puff  1 puff Inhalation Daily STruett Mainland DO   1 puff at 03/07/22 0930   [START ON 03/08/2022] vancomycin (VANCOREADY) IVPB 750 mg/150 mL  750 mg Intravenous Q48H STruett Mainland DO       Current Outpatient Medications  Medication Sig Dispense Refill   albuterol (PROVENTIL HFA;VENTOLIN HFA) 108 (90 BASE) MCG/ACT inhaler Inhale 2 puffs into the lungs daily as needed for wheezing. For shortness of breath     albuterol (PROVENTIL) (2.5 MG/3ML) 0.083% nebulizer solution Take 2.5 mg by nebulization every 6 (six) hours as needed for wheezing or shortness of breath.      amitriptyline (ELAVIL) 100 MG tablet Take 100 mg by mouth at bedtime.     atorvastatin (LIPITOR) 20 MG tablet Take 20 mg by mouth daily.     bisacodyl (DULCOLAX) 5 MG EC tablet Take 1 tablet (5 mg total) by mouth 2 (two) times daily. 14 tablet 0   budesonide-formoterol (SYMBICORT) 80-4.5  MCG/ACT inhaler Inhale 2 puffs into the lungs 2 (two) times daily. (Patient taking differently: Inhale 2 puffs into the lungs daily.) 1 Inhaler 0   busPIRone (BUSPAR) 10 MG tablet Take 10 mg by mouth 3 (three) times daily.     citalopram (CELEXA) 40 MG tablet Take 40 mg by mouth daily.     cyclobenzaprine (FLEXERIL) 5 MG tablet Take 5 mg by mouth 3 (three) times daily as needed for muscle spasms.     Docusate Sodium (DSS) 100 MG CAPS Take 1 capsule by mouth at bedtime.     ferrous sulfate 325 (65 FE) MG tablet Take 325 mg by mouth daily.     furosemide (LASIX) 20 MG tablet Take 20 mg by mouth daily.     gabapentin (NEURONTIN) 300 MG capsule Take 300 mg by mouth 3 (three) times daily.      levothyroxine (SYNTHROID, LEVOTHROID) 25 MCG tablet Take 25 mcg by mouth daily before breakfast.     lisinopril (PRINIVIL,ZESTRIL) 2.5 MG tablet Take 2.5 mg by mouth daily.      magnesium oxide (MAG-OX) 400 (240 Mg) MG tablet Take 1 tablet (400 mg total) by mouth 2 (two) times daily. 60 tablet 0   metFORMIN (GLUCOPHAGE) 500 MG tablet Take 2 tablets by mouth 2 (two) times daily.     methocarbamol (ROBAXIN) 500 MG tablet Take 500 mg by mouth 3 (three) times daily.     ondansetron (ZOFRAN) 4 MG tablet Take 4 mg by mouth every 8 (eight) hours as needed for vomiting or nausea.     oxyCODONE (ROXICODONE) 15 MG immediate release tablet Take 15 mg by mouth 4 (four) times daily.  OXYGEN Inhale 3 L into the lungs daily as needed (for shortness of breath).     Pancrelipase, Lip-Prot-Amyl, (ZENPEP) 20000-63000 units CPEP Take 1 capsule by mouth 3 times a day with meals and 1 capsules with snacks.     pantoprazole (PROTONIX) 40 MG tablet Take 40 mg by mouth 2 (two) times daily.     polyethylene glycol powder (GLYCOLAX/MIRALAX) powder Take 17 g by mouth 2 (two) times daily. Until daily soft stools  OTC (Patient taking differently: Take 17 g by mouth as needed for mild constipation. Until daily soft stools  OTC) 250 g 0    SPIRIVA HANDIHALER 18 MCG inhalation capsule Place 18 mcg into inhaler and inhale daily.      valACYclovir (VALTREX) 500 MG tablet Take 500 mg by mouth daily.     azithromycin (ZITHROMAX) 500 MG tablet Take 1 tablet (500 mg total) by mouth daily. (Patient not taking: Reported on 03/06/2022) 4 tablet 0   cefdinir (OMNICEF) 300 MG capsule Take 1 capsule (300 mg total) by mouth every 12 (twelve) hours. (Patient not taking: Reported on 03/06/2022) 8 capsule 0   mupirocin cream (BACTROBAN) 2 % Apply 1 application topically daily. Apply to right 2nd toe and left great toe once daily until healed. 30 g 1    Allergies as of 03/06/2022 - Review Complete 03/06/2022  Allergen Reaction Noted   Penicillins Shortness Of Breath    Sulfa antibiotics Shortness Of Breath 04/21/2011   Aspirin Nausea And Vomiting 03/05/2015   Enoxaparin Other (See Comments) 01/22/2015   Lovenox  [enoxaparin sodium] Other (See Comments) 03/05/2015   Prednisone Other (See Comments) 07/14/2011    Family History  Problem Relation Age of Onset   Asthma Other    Asthma Paternal Grandfather    Heart attack Paternal Grandfather    Hypertension Mother    Cancer Maternal Grandmother    Diabetes Paternal Grandmother    Coronary artery disease Neg Hx    Colon cancer Neg Hx     Social History   Socioeconomic History   Marital status: Divorced    Spouse name: Not on file   Number of children: Not on file   Years of education: Not on file   Highest education level: Some college, no degree  Occupational History   Occupation: disabled  Tobacco Use   Smoking status: Every Day    Packs/day: 1.00    Years: 25.00    Total pack years: 25.00    Types: Cigarettes    Passive exposure: Current   Smokeless tobacco: Never  Vaping Use   Vaping Use: Never used  Substance and Sexual Activity   Alcohol use: No   Drug use: No   Sexual activity: Yes    Birth control/protection: Surgical  Other Topics Concern   Not on file  Social  History Narrative   ** Merged History Encounter **       She is on disability for pancreatitis since 2014.  She was previously working as a Presenter, broadcasting in 2004. She lives with her husband in a two level home.    Social Determinants of Health   Financial Resource Strain: Low Risk  (02/16/2019)   Overall Financial Resource Strain (CARDIA)    Difficulty of Paying Living Expenses: Not hard at all  Food Insecurity: No Food Insecurity (10/16/2019)   Hunger Vital Sign    Worried About Running Out of Food in the Last Year: Never true    Ran Out of Food in the  Last Year: Never true  Transportation Needs: No Transportation Needs (10/16/2019)   PRAPARE - Hydrologist (Medical): No    Lack of Transportation (Non-Medical): No  Physical Activity: Not on file  Stress: Not on file  Social Connections: Not on file  Intimate Partner Violence: Not on file    Review of Systems: General: Negative for anorexia, weight loss, fever, chills, fatigue, weakness. Eyes: Negative for vision changes.  ENT: Negative for hoarseness, difficulty swallowing , nasal congestion. CV: Negative for chest pain, angina, palpitations, dyspnea on exertion, peripheral edema.  Respiratory: Negative for dyspnea at rest, dyspnea on exertion, cough, sputum, wheezing.  GI: See history of present illness. GU:  Negative for dysuria, hematuria, urinary incontinence, urinary frequency, nocturnal urination.  MS: Negative for joint pain, low back pain.  Derm: Negative for rash or itching.  Neuro: Negative for weakness, abnormal sensation, seizure, frequent headaches, memory loss, confusion.  Psych: Negative for anxiety, depression Endo: Negative for unusual weight change.  Heme: Negative for bruising or bleeding. Allergy: Negative for rash or hives.  Physical Exam: Vital signs in last 24 hours: Temp:  [97.7 F (36.5 C)-99 F (37.2 C)] 98.6 F (37 C) (08/12 0915) Pulse Rate:  [84-108] 94 (08/12  1000) Resp:  [11-27] 11 (08/12 1000) BP: (75-141)/(47-99) 109/63 (08/12 1000) SpO2:  [89 %-100 %] 95 % (08/12 1000) Weight:  [53.5 kg] 53.5 kg (08/11 1336)   General:   Alert,  Well-developed, well-nourished, pleasant and cooperative in NAD Head:  Normocephalic and atraumatic. Eyes:  Sclera clear, no icterus.   Conjunctiva pink. Ears:  Normal auditory acuity. Nose:  No deformity, discharge,  or lesions. Mouth:  No deformity or lesions, dentition normal. Neck:  Supple; no masses or thyromegaly. Lungs:  Clear throughout to auscultation.   No wheezes, crackles, or rhonchi. No acute distress. Heart:  Regular rate and rhythm; no murmurs, clicks, rubs,  or gallops. Abdomen:  Soft, nontender and nondistended. No masses, hepatosplenomegaly or hernias noted. Normal bowel sounds, without guarding, and without rebound.   Msk:  Symmetrical without gross deformities. Normal posture. Pulses:  Normal pulses noted. Extremities:  Without clubbing or edema. Neurologic:  Alert and  oriented x4;  grossly normal neurologically. Skin:  Intact without significant lesions or rashes. Cervical Nodes:  No significant cervical adenopathy. Psych:  Alert and cooperative. Normal mood and affect.  Intake/Output from previous day: 08/11 0701 - 08/12 0700 In: -  Out: 1375 [QASTM:1962] Intake/Output this shift: Total I/O In: -  Out: 1600 [Urine:1600]  Lab Results: Recent Labs    03/06/22 1432 03/06/22 1754 03/07/22 0435  WBC 14.2*  --  9.6  HGB 11.8* 10.0* 8.7*  HCT 37.8 34.7* 28.2*  PLT 322  --  185   BMET Recent Labs    03/06/22 1432 03/06/22 2139 03/07/22 0435  NA 133* 138 139  K 5.9* 4.8 4.8  CL 96* 108 112*  CO2 18* 22 22  GLUCOSE 131* 95 133*  BUN 51* 42* 34*  CREATININE 2.77* 1.73* 1.11*  CALCIUM 7.8* 7.0* 7.5*   LFT Recent Labs    03/06/22 1617  PROT 7.4  ALBUMIN 3.6  AST 41  ALT 27  ALKPHOS 97  BILITOT 0.5  BILIDIR 0.1  IBILI 0.4   PT/INR Recent Labs    03/06/22 1617   LABPROT 11.4  INR 0.8   Hepatitis Panel No results for input(s): "HEPBSAG", "HCVAB", "HEPAIGM", "HEPBIGM" in the last 72 hours. C-Diff No results for input(s): "CDIFFTOX"  in the last 72 hours.  Studies/Results: CT ABDOMEN PELVIS WO CONTRAST  Result Date: 03/06/2022 CLINICAL DATA:  Abdominal pain, coffee-ground emesis EXAM: CT ABDOMEN AND PELVIS WITHOUT CONTRAST TECHNIQUE: Multidetector CT imaging of the abdomen and pelvis was performed following the standard protocol without IV contrast. RADIATION DOSE REDUCTION: This exam was performed according to the departmental dose-optimization program which includes automated exposure control, adjustment of the mA and/or kV according to patient size and/or use of iterative reconstruction technique. COMPARISON:  02/16/2022 FINDINGS: Lower chest: Patchy multifocal airspace opacities within the lung bases, right worse than left. Normal heart size. Hepatobiliary: No focal liver abnormality is seen. Status post cholecystectomy. No biliary dilatation. Pancreas: Pancreas is atrophic.  No inflammatory changes. Spleen: Normal in size without focal abnormality. Adrenals/Urinary Tract: Adrenal glands are unremarkable. Kidneys are normal, without renal calculi, focal lesion, or hydronephrosis. Bladder is unremarkable. Stomach/Bowel: Moderate-sized hiatal hernia. Chronically thickened gastric folds within the portion of the stomach in the hernia sac. Borderline dilated loops of small bowel within the lower abdomen with air-fluid levels. There is some fecalization of small bowel content. No abrupt transition point is seen. There is a large volume of stool throughout the colon. Normal appendix in the right lower quadrant. No pericolonic inflammatory changes. Vascular/Lymphatic: Scattered aortoiliac atherosclerotic calcifications without aneurysm. No abdominopelvic lymphadenopathy. Reproductive: Status post hysterectomy. No adnexal masses. Other: No free fluid. No abdominopelvic  fluid collection. No pneumoperitoneum. No abdominal wall hernia. Musculoskeletal: Healing changes are noted at the L1 superior endplate compression fracture without progressive height loss. Chronic T12 compression fracture. No new or acute bony findings. IMPRESSION: 1. Borderline dilated loops of small bowel within the lower abdomen with air-fluid levels and some fecalization of small bowel content. No abrupt transition point is seen. Findings are suggestive of an ileus or enteritis versus developing small bowel obstruction. 2. Large volume of stool throughout the colon suggesting constipation. 3. Patchy multifocal airspace opacities within the lung bases, right worse than left, most compatible with multifocal pneumonia. 4. Moderate-sized hiatal hernia with chronically thickened gastric folds. 5. Healing changes at the L1 superior endplate compression fracture without progressive height loss. Chronic T12 compression fracture. 6. Aortic Atherosclerosis (ICD10-I70.0). Electronically Signed   By: Davina Poke D.O.   On: 03/06/2022 17:55    Impression: *Coffee-ground emesis/upper GI bleed *Acute blood loss anemia due to above *Sepsis with multifocal pneumonia *Ileus versus partial small bowel obstruction  Plan: Discussed case in depth with patient today.  We may need to consider further evaluation for upper GI bleed/coffee-ground emesis with upper endoscopy though we will hold off for now.  Would ideally wait until sepsis better control, pneumonia improved, respiratory status improved.  Continue on IV Protonix twice daily.  Avoid NSAIDs  Monitor H&H and transfuse for less than 7.  Clear liquid diet okay.  Agree with bowel regimen for her ileus.  Surgery following.  GI to continue to follow.  Elon Alas. Abbey Chatters, D.O. Gastroenterology and Hepatology [facility]    LOS: 1 day     03/07/2022, 11:08 AM

## 2022-03-07 NOTE — Consult Note (Signed)
Reason for Consult: Ileus versus partial small bowel obstruction, constipation Referring Physician: Dr. Colin Ina P [name] is an 51 y.o. female.  HPI: Patient is a 51 year old white female with multiple medical problems who states that she was diagnosed 2 weeks ago with an L1 compression fracture and was discharged home with a brace and pain medication.  Yesterday morning, she started having diffuse body aches, nausea, and an episode of hematemesis.  She last had a bowel movement 2 weeks ago.  She states she aches all over.  She was hypotensive initially and required fluid boluses as well as low-dose Levophed.  She was also diagnosed with pneumonia.  This morning, she states she just hurts all over and has no specific abdominal pain.  Past Medical History:  Diagnosis Date   Anemia    Anxiety    Asthma    Bipolar 1 disorder ([place])    Chronic abdominal pain    COPD (chronic obstructive pulmonary disease) ([place])    Depression    Diabetes mellitus    Diabetic neuropathy ([place]) 10/29/2017   Esophagitis    Hiatal hernia    Hyperlipidemia 02/03/2012   Iron deficiency anemia due to chronic blood loss 01/29/2022   Migraine    Neck pain 06/08/2012   Pancreatitis chronic Idiopathic   Treated by Dr. Newman [name] at [facility] in the past with celiac blocks.    Past Surgical History:  Procedure Laterality Date   ABDOMINAL HYSTERECTOMY     attempted colonoscopy  02/2017   Dr. Newman [name]: Poor prep, scope passed to mid transverse colon before colonoscopy aborted.  No significant findings noted to mid transverse colon.  Next colonoscopy in 2 years.   CELIAC PLEXUS BLOCK     CHOLECYSTECTOMY     COLONOSCOPY N/A 12/05/2012   MLY:YTKPTW rectum, colon and terminal ileum   ESOPHAGOGASTRODUODENOSCOPY  02/20/2008   SFK:CLEXNTZGY distal esophageal mucosa, suspicious for neoplasm/Hiatal hernia otherwise normal    ESOPHAGOGASTRODUODENOSCOPY  04/03/2008   FVC:[id number] hiatal hernia, otherwise  normal/Short, tight, benign-appearing peptic stricture   ESOPHAGOGASTRODUODENOSCOPY  November 16, 2012   Dr. Britta [name]: hiatal hernia, esophagitis,    ESOPHAGOGASTRODUODENOSCOPY (EGD) WITH PROPOFOL N/A 05/09/2019   Dr. Oneida [name]: Large hiatal hernia with erythema and edema in the pouch, mild gastritis.  No biopsies taken.   ESOPHAGOGASTRODUODENOSCOPY (EGD) WITH PROPOFOL N/A 05/26/2019   Procedure: ESOPHAGOGASTRODUODENOSCOPY (EGD) WITH PROPOFOL;  Surgeon: Chelsea Binder, MD;  [facility];  Service: Endoscopy;  Laterality: N/A;   EUS  12/2018   Dr. Newman [name]: LA grade B esophagitis, antritis but negative H. pylori, 1 cm ulcer at the duodenal sweep   GIVENS CAPSULE STUDY N/A 12/05/2012   Few superficial erosions but nothing found to explain iron deficiency anemia.    GIVENS CAPSULE STUDY N/A 05/26/2019   Procedure: GIVENS CAPSULE STUDY;  Surgeon: Chelsea Binder, MD;  [facility];  Service: Endoscopy;  Laterality: N/A;   Ileocolonoscopy  06/05/2008     YKZ:[id number] anal canal, otherwise normal rectum, colon   TONSILLECTOMY      Family History  Problem Relation Age of Onset   Asthma Other    Asthma Paternal Grandfather    Heart attack Paternal Grandfather    Hypertension Mother    Cancer Maternal Grandmother    Diabetes Paternal Grandmother    Coronary artery disease Neg Hx    Colon cancer Neg Hx     Social History:  reports that she has been smoking cigarettes. She has a 25.00 pack-year  smoking history. She has been exposed to tobacco smoke. She has never used smokeless tobacco. She reports that she does not drink alcohol and does not use drugs.  Allergies:  Allergies  Allergen Reactions   Penicillins Shortness Of Breath     Has patient had a PCN reaction causing immediate rash, facial/tongue/throat swelling, SOB or lightheadedness with hypotension: Yes Has patient had a PCN reaction causing severe rash involving mucus membranes or skin necrosis: Yes Has patient had a  PCN reaction that required hospitalization No Has patient had a PCN reaction occurring within the last 10 years: No  If all of the above answers are "NO", then may proceed with Cephalosporin use. Tolerated ceftriaxone   Sulfa Antibiotics Shortness Of Breath   Aspirin Nausea And Vomiting   Enoxaparin Other (See Comments)    Has been known to cause her blood clots in her legs Has been known to cause her blood clots in her legs   Lovenox  [Enoxaparin Sodium] Other (See Comments)    Has been known to cause her blood clots in her legs   Prednisone Other (See Comments)    Pancreatitis, but has to take for asthma sometimes    Medications: I have reviewed the patient's current medications. Prior to Admission: (Not in a hospital admission)   Results for orders placed or performed during the hospital encounter of 03/06/22 (from the past 48 hour(s))  Urinalysis, Routine w reflex microscopic Urine, Clean Catch     Status: Abnormal   Collection Time: 03/06/22  2:31 PM  Result Value Ref Range   Color, Urine YELLOW YELLOW   APPearance CLOUDY (A) CLEAR   Specific Gravity, Urine 1.026 1.005 - 1.030   pH 5.0 5.0 - 8.0   Glucose, UA NEGATIVE NEGATIVE mg/dL   Hgb urine dipstick NEGATIVE NEGATIVE   Bilirubin Urine NEGATIVE NEGATIVE   Ketones, ur NEGATIVE NEGATIVE mg/dL   Protein, ur 30 (A) NEGATIVE mg/dL   Nitrite NEGATIVE NEGATIVE   Leukocytes,Ua MODERATE (A) NEGATIVE   RBC / HPF 6-10 0 - 5 RBC/hpf   WBC, UA 11-20 0 - 5 WBC/hpf   Bacteria, UA RARE (A) NONE SEEN   Squamous Epithelial / LPF 21-50 0 - 5   WBC Clumps PRESENT    Mucus PRESENT    Budding Yeast PRESENT    Hyaline Casts, UA PRESENT     Comment: Performed at [facility], [address] [place], [place] [address]  CBC with Differential     Status: Abnormal   Collection Time: 03/06/22  2:32 PM  Result Value Ref Range   WBC 14.2 (H) 4.0 - 10.5 K/uL   RBC 4.17 3.87 - 5.11 MIL/uL   Hemoglobin 11.8 (L) 12.0 - 15.0 g/dL   HCT 37.8 36.0  - 46.0 %   MCV 90.6 80.0 - 100.0 fL   MCH 28.3 26.0 - 34.0 pg   MCHC 31.2 30.0 - 36.0 g/dL   RDW 22.8 (H) 11.5 - 15.5 %   Platelets 322 150 - 400 K/uL   nRBC 0.0 0.0 - 0.2 %   Neutrophils Relative % 54 %   Neutro Abs 11.6 (H) 1.7 - 7.7 K/uL   Band Neutrophils 28 %   Lymphocytes Relative 5 %   Lymphs Abs 0.7 0.7 - 4.0 K/uL   Monocytes Relative 3 %   Monocytes Absolute 0.4 0.1 - 1.0 K/uL   Eosinophils Relative 0 %   Eosinophils Absolute 0.0 0.0 - 0.5 K/uL   Basophils Relative 0 %  Basophils Absolute 0.0 0.0 - 0.1 K/uL   WBC Morphology DOHLE BODIES     Comment: INCREASED BANDS (>20% BANDS) MILD LEFT SHIFT (1-5% METAS, OCC MYELO, OCC BANDS) VACUOLATED NEUTROPHILS    Smear Review MORPHOLOGY UNREMARKABLE    Metamyelocytes Relative 8 %   Myelocytes 2 %   Abs Immature Granulocytes 1.40 (H) 0.00 - 0.07 K/uL   Polychromasia PRESENT     Comment: Performed at [facility], [address], [place], [place] [address]  Basic metabolic panel     Status: Abnormal   Collection Time: 03/06/22  2:32 PM  Result Value Ref Range   Sodium 133 (L) 135 - 145 mmol/L   Potassium 5.9 (H) 3.5 - 5.1 mmol/L   Chloride 96 (L) 98 - 111 mmol/L   CO2 18 (L) 22 - 32 mmol/L   Glucose, Bld 131 (H) 70 - 99 mg/dL    Comment: Glucose reference range applies only to samples taken after fasting for at least 8 hours.   BUN 51 (H) 6 - 20 mg/dL   Creatinine, Ser 2.77 (H) 0.44 - 1.00 mg/dL   Calcium 7.8 (L) 8.9 - 10.3 mg/dL   GFR, Estimated 20 (L) >60 mL/min    Comment: (NOTE) Calculated using the CKD-EPI Creatinine Equation (2021)    Anion gap 19 (H) 5 - 15    Comment: Performed at [facility], [address], [place], [place] [address]  Lactic acid, plasma     Status: Abnormal   Collection Time: 03/06/22  2:53 PM  Result Value Ref Range   Lactic Acid, Venous 2.9 (HH) 0.5 - 1.9 mmol/L    Comment: CRITICAL RESULT CALLED TO, READ BACK BY AND VERIFIED WITH: MORGAN EANES '@[id number]'$  03/06/22 BY GMCGEEHON. Performed at  [facility], [address], [place], [place] [address]   Culture, blood (Routine x 2)     Status: None (Preliminary result)   Collection Time: 03/06/22  2:53 PM   Specimen: BLOOD RIGHT ARM  Result Value Ref Range   Specimen Description      BLOOD RIGHT ARM BOTTLES DRAWN AEROBIC AND ANAEROBIC   Special Requests Blood Culture adequate volume    Culture      NO GROWTH < 12 HOURS Performed at [facility], [address], [place], [place] [address]    Report Status PENDING   Hepatic function panel     Status: None   Collection Time: 03/06/22  4:17 PM  Result Value Ref Range   Total Protein 7.4 6.5 - 8.1 g/dL   Albumin 3.6 3.5 - 5.0 g/dL   AST 41 15 - 41 U/L   ALT 27 0 - 44 U/L   Alkaline Phosphatase 97 38 - 126 U/L   Total Bilirubin 0.5 0.3 - 1.2 mg/dL   Bilirubin, Direct 0.1 0.0 - 0.2 mg/dL   Indirect Bilirubin 0.4 0.3 - 0.9 mg/dL    Comment: Performed at [facility], [address] [place], [place] [address]  Lipase, blood     Status: None   Collection Time: 03/06/22  4:17 PM  Result Value Ref Range   Lipase 21 11 - 51 U/L    Comment: Performed at [facility], [address] [place], [place] [address]  Protime-INR     Status: None   Collection Time: 03/06/22  4:17 PM  Result Value Ref Range   Prothrombin Time 11.4 11.4 - 15.2 seconds   INR 0.8 0.8 - 1.2    Comment: (NOTE) INR goal varies based on  device and disease states. Performed at [facility], [address], [place], [place] [address]   Culture, blood (Routine x 2)     Status: None (Preliminary result)   Collection Time: 03/06/22  4:35 PM   Specimen: Left Antecubital; Blood  Result Value Ref Range   Specimen Description      LEFT ANTECUBITAL BOTTLES DRAWN AEROBIC AND ANAEROBIC   Special Requests Blood Culture adequate volume    Culture      NO GROWTH < 12 HOURS Performed at [facility], [address] [place], [place] [address]    Report Status PENDING   Type and screen [facility]     Status: None    Collection Time: 03/06/22  4:35 PM  Result Value Ref Range   ABO/RH(D) A POS    Antibody Screen NEG    Sample Expiration      03/09/2022,2359 Performed at [facility], [address], [place], [place] [address]   Lactic acid, plasma     Status: None   Collection Time: 03/06/22  4:53 PM  Result Value Ref Range   Lactic Acid, Venous 1.8 0.5 - 1.9 mmol/L    Comment: Performed at [facility], [address] [place], [place] [address]  Hemoglobin and hematocrit, blood     Status: Abnormal   Collection Time: 03/06/22  5:54 PM  Result Value Ref Range   Hemoglobin 10.0 (L) 12.0 - 15.0 g/dL   HCT 34.7 (L) 36.0 - 46.0 %    Comment: Performed at [facility], [address] [place], [place] [address]  Resp Panel by RT-PCR (Flu A&B, Covid) Anterior Nasal Swab     Status: None   Collection Time: 03/06/22  9:30 PM   Specimen: Anterior Nasal Swab  Result Value Ref Range   SARS Coronavirus 2 by RT PCR NEGATIVE NEGATIVE    Comment: (NOTE) SARS-CoV-2 target nucleic acids are NOT DETECTED.  The SARS-CoV-2 RNA is generally detectable in upper respiratory specimens during the acute phase of infection. The lowest concentration of SARS-CoV-2 viral copies this assay can detect is 138 copies/mL. A negative result does not preclude SARS-Cov-2 infection and should not be used as the sole basis for treatment or other patient management decisions. A negative result may occur with  improper specimen collection/handling, submission of specimen other than nasopharyngeal swab, presence of viral mutation(s) within the areas targeted by this assay, and inadequate number of viral copies(<138 copies/mL). A negative result must be combined with clinical observations, patient history, and epidemiological information. The expected result is Negative.  Fact Sheet for Patients:  EntrepreneurPulse.com.au  Fact Sheet for Healthcare Providers:  IncredibleEmployment.be  This  test is no t yet approved or cleared by the [place] FDA and  has been authorized for detection and/or diagnosis of SARS-CoV-2 by FDA under an Emergency Use Authorization (EUA). This EUA will remain  in effect (meaning this test can be used) for the duration of the COVID-19 declaration under Section 564(b)(1) of the Act, 21 U.S.C.section 360bbb-3(b)(1), unless the authorization is terminated  or revoked sooner.       Influenza A by PCR NEGATIVE NEGATIVE   Influenza B by PCR NEGATIVE NEGATIVE    Comment: (NOTE) The Xpert Xpress SARS-CoV-2/FLU/RSV plus assay is intended as an aid in the diagnosis of influenza from Nasopharyngeal swab specimens and should not be used as a sole basis for treatment. Nasal washings and aspirates are unacceptable for Xpert Xpress SARS-CoV-2/FLU/RSV testing.  Fact Sheet for Patients: EntrepreneurPulse.com.au  Fact Sheet for Healthcare Providers: IncredibleEmployment.be  This test is not yet approved or cleared by the [place] FDA and has been authorized for detection and/or diagnosis of SARS-CoV-2 by FDA under an Emergency Use Authorization (EUA). This EUA will remain in effect (meaning this test can be used) for the duration of the COVID-19 declaration under Section 564(b)(1) of the Act, 21 U.S.C. section 360bbb-3(b)(1), unless the authorization is terminated or revoked.  Performed at [facility], [address] [place], [place] [address]   Basic metabolic panel     Status: Abnormal   Collection Time: 03/06/22  9:39 PM  Result Value Ref Range   Sodium 138 135 - 145 mmol/L   Potassium 4.8 3.5 - 5.1 mmol/L   Chloride 108 98 - 111 mmol/L   CO2 22 22 - 32 mmol/L   Glucose, Bld 95 70 - 99 mg/dL    Comment: Glucose reference range applies only to samples taken after fasting for at least 8 hours.   BUN 42 (H) 6 - 20 mg/dL   Creatinine, Ser 1.73 (H) 0.44 - 1.00 mg/dL   Calcium 7.0 (L) 8.9 - 10.3 mg/dL    GFR, Estimated 35 (L) >60 mL/min    Comment: (NOTE) Calculated using the CKD-EPI Creatinine Equation (2021)    Anion gap 8 5 - 15    Comment: Performed at [facility], [address], [place],  [address]  Procalcitonin - Baseline     Status: None   Collection Time: 03/06/22  9:39 PM  Result Value Ref Range   Procalcitonin 18.27 ng/mL    Comment:        Interpretation: PCT >= 10 ng/mL: Important systemic inflammatory response, almost exclusively due to severe bacterial sepsis or septic shock. (NOTE)       Sepsis PCT Algorithm           Lower Respiratory Tract                                      Infection PCT Algorithm    ----------------------------     ----------------------------         PCT < 0.25 ng/mL                PCT < 0.10 ng/mL          Strongly encourage             Strongly discourage   discontinuation of antibiotics    initiation of antibiotics    ----------------------------     -----------------------------       PCT 0.25 - 0.50 ng/mL            PCT 0.10 - 0.25 ng/mL               OR       >80% decrease in PCT            Discourage initiation of                                            antibiotics      Encourage discontinuation           of antibiotics    ----------------------------     -----------------------------         PCT >= 0.50 ng/mL  PCT 0.26 - 0.50 ng/mL                AND       <80% decrease in PCT             Encourage initiation of                                             antibiotics       Encourage continuation           of antibiotics    ----------------------------     -----------------------------        PCT >= 0.50 ng/mL                  PCT > 0.50 ng/mL               AND         increase in PCT                  Strongly encourage                                      initiation of antibiotics    Strongly encourage escalation           of antibiotics                                      -----------------------------                                           PCT <= 0.25 ng/mL                                                 OR                                        > 80% decrease in PCT                                      Discontinue / Do not initiate                                             antibiotics  Performed at [facility], [address] [place] [address] [place], [place] [address]   Procalcitonin     Status: None   Collection Time: 03/07/22  4:35 AM  Result Value Ref Range   Procalcitonin 8.58 ng/mL    Comment:        Interpretation: PCT > 2 ng/mL: Systemic infection (sepsis) is likely, unless other causes are known. (NOTE)       Sepsis PCT Algorithm  Lower Respiratory Tract                                      Infection PCT Algorithm    ----------------------------     ----------------------------         PCT < 0.25 ng/mL                PCT < 0.10 ng/mL          Strongly encourage             Strongly discourage   discontinuation of antibiotics    initiation of antibiotics    ----------------------------     -----------------------------       PCT 0.25 - 0.50 ng/mL            PCT 0.10 - 0.25 ng/mL               OR       >80% decrease in PCT            Discourage initiation of                                            antibiotics      Encourage discontinuation           of antibiotics    ----------------------------     -----------------------------         PCT >= 0.50 ng/mL              PCT 0.26 - 0.50 ng/mL               AND       <80% decrease in PCT              Encourage initiation of                                             antibiotics       Encourage continuation           of antibiotics    ----------------------------     -----------------------------        PCT >= 0.50 ng/mL                  PCT > 0.50 ng/mL               AND         increase in PCT                  Strongly encourage                                      initiation of  antibiotics    Strongly encourage escalation           of antibiotics                                     -----------------------------  PCT <= 0.25 ng/mL                                                 OR                                        > 80% decrease in PCT                                      Discontinue / Do not initiate                                             antibiotics  Performed at [facility], [address], [place], [place] [address]   TSH     Status: None   Collection Time: 03/07/22  4:35 AM  Result Value Ref Range   TSH 1.977 0.350 - 4.500 uIU/mL    Comment: Performed by a 3rd Generation assay with a functional sensitivity of <=0.01 uIU/mL. Performed at [facility], [address], [place], [place] [address]   CBC     Status: Abnormal   Collection Time: 03/07/22  4:35 AM  Result Value Ref Range   WBC 9.6 4.0 - 10.5 K/uL   RBC 3.08 (L) 3.87 - 5.11 MIL/uL   Hemoglobin 8.7 (L) 12.0 - 15.0 g/dL   HCT 28.2 (L) 36.0 - 46.0 %   MCV 91.6 80.0 - 100.0 fL   MCH 28.2 26.0 - 34.0 pg   MCHC 30.9 30.0 - 36.0 g/dL   RDW 22.7 (H) 11.5 - 15.5 %   Platelets 185 150 - 400 K/uL   nRBC 0.0 0.0 - 0.2 %    Comment: Performed at [facility], [address] [place], [place] [address]  Basic metabolic panel     Status: Abnormal   Collection Time: 03/07/22  4:35 AM  Result Value Ref Range   Sodium 139 135 - 145 mmol/L   Potassium 4.8 3.5 - 5.1 mmol/L   Chloride 112 (H) 98 - 111 mmol/L   CO2 22 22 - 32 mmol/L   Glucose, Bld 133 (H) 70 - 99 mg/dL    Comment: Glucose reference range applies only to samples taken after fasting for at least 8 hours.   BUN 34 (H) 6 - 20 mg/dL   Creatinine, Ser 1.11 (H) 0.44 - 1.00 mg/dL   Calcium 7.5 (L) 8.9 - 10.3 mg/dL   GFR, Estimated 59 (L) >60 mL/min    Comment: (NOTE) Calculated using the CKD-EPI Creatinine Equation (2021)    Anion gap 5 5 - 15    Comment: Performed at [facility], [address] [place], [place] [address]  Phosphorus     Status: None   Collection Time: 03/07/22  4:35 AM  Result Value Ref Range   Phosphorus 3.5 2.5 - 4.6 mg/dL    Comment: Performed at [facility], [address] [place], [place] [address]  Magnesium     Status: Abnormal   Collection Time: 03/07/22  4:35 AM  Result Value Ref Range  Magnesium 1.4 (L) 1.7 - 2.4 mg/dL    Comment: Performed at [facility], [address], [place], [place] [address]  CBG monitoring, ED     Status: Abnormal   Collection Time: 03/07/22  5:28 AM  Result Value Ref Range   Glucose-Capillary 157 (H) 70 - 99 mg/dL    Comment: Glucose reference range applies only to samples taken after fasting for at least 8 hours.  CBG monitoring, ED     Status: Abnormal   Collection Time: 03/07/22  8:14 AM  Result Value Ref Range   Glucose-Capillary 108 (H) 70 - 99 mg/dL    Comment: Glucose reference range applies only to samples taken after fasting for at least 8 hours.    CT ABDOMEN PELVIS WO CONTRAST  Result Date: 03/06/2022 CLINICAL DATA:  Abdominal pain, coffee-ground emesis EXAM: CT ABDOMEN AND PELVIS WITHOUT CONTRAST TECHNIQUE: Multidetector CT imaging of the abdomen and pelvis was performed following the standard protocol without IV contrast. RADIATION DOSE REDUCTION: This exam was performed according to the departmental dose-optimization program which includes automated exposure control, adjustment of the mA and/or kV according to patient size and/or use of iterative reconstruction technique. COMPARISON:  02/16/2022 FINDINGS: Lower chest: Patchy multifocal airspace opacities within the lung bases, right worse than left. Normal heart size. Hepatobiliary: No focal liver abnormality is seen. Status post cholecystectomy. No biliary dilatation. Pancreas: Pancreas is atrophic.  No inflammatory changes. Spleen: Normal in size without focal abnormality. Adrenals/Urinary Tract: Adrenal glands are unremarkable. Kidneys are normal, without  renal calculi, focal lesion, or hydronephrosis. Bladder is unremarkable. Stomach/Bowel: Moderate-sized hiatal hernia. Chronically thickened gastric folds within the portion of the stomach in the hernia sac. Borderline dilated loops of small bowel within the lower abdomen with air-fluid levels. There is some fecalization of small bowel content. No abrupt transition point is seen. There is a large volume of stool throughout the colon. Normal appendix in the right lower quadrant. No pericolonic inflammatory changes. Vascular/Lymphatic: Scattered aortoiliac atherosclerotic calcifications without aneurysm. No abdominopelvic lymphadenopathy. Reproductive: Status post hysterectomy. No adnexal masses. Other: No free fluid. No abdominopelvic fluid collection. No pneumoperitoneum. No abdominal wall hernia. Musculoskeletal: Healing changes are noted at the L1 superior endplate compression fracture without progressive height loss. Chronic T12 compression fracture. No new or acute bony findings. IMPRESSION: 1. Borderline dilated loops of small bowel within the lower abdomen with air-fluid levels and some fecalization of small bowel content. No abrupt transition point is seen. Findings are suggestive of an ileus or enteritis versus developing small bowel obstruction. 2. Large volume of stool throughout the colon suggesting constipation. 3. Patchy multifocal airspace opacities within the lung bases, right worse than left, most compatible with multifocal pneumonia. 4. Moderate-sized hiatal hernia with chronically thickened gastric folds. 5. Healing changes at the L1 superior endplate compression fracture without progressive height loss. Chronic T12 compression fracture. 6. Aortic Atherosclerosis (ICD10-I70.0). Electronically Signed   By: Davina Poke D.O.   On: 03/06/2022 17:55    ROS:  Pertinent items are noted in HPI.  Blood pressure (!) 91/59, pulse 86, temperature 98.6 F (37 C), temperature source Oral, resp. rate  15, height '5\' 2"'$  (1.575 m), weight 53.5 kg, SpO2 94 %. Physical Exam: Fatigued white female in no acute distress Head is normocephalic, atraumatic Lungs clear to auscultation with bibasilar rales present. Abdomen is soft, flat, nontender, nondistended.  No specific point tenderness is noted.  No rigidity is noted.  CT scan images personally reviewed Assessment/Plan: Impression: Ileus versus early partial  small bowel obstruction, significant constipation.  Patient also has a hiatal hernia but I do not think she has strangulated her stomach. Plan: We will write for molasses enemas.  We will follow with you.  No need for acute surgical invention at this time.  [name] 03/07/2022, 9:44 AM

## 2022-03-07 NOTE — ED Notes (Signed)
Breakfast tray given to pt. Nurse aware.

## 2022-03-07 NOTE — Progress Notes (Addendum)
PROGRESS NOTE     [name], is a 51 y.o. female, DOB - 30-Jan-1971, [id number]  Admit date - 03/06/2022   Admitting Physician Truett Mainland, DO  Outpatient Primary MD for the patient is Redmond School, MD  LOS - 1  Chief Complaint  Patient presents with   Back Pain      Brief Narrative:  51 y.o. female with medical history significant of type 2 diabetes, hypothyroidism, COPD/asthma, hyperlipidemia, chronic pancreatitis admitted on 03/06/2022 with concerns for acute GI bleed and possible ileus versus partial small bowel obstruction as well as sepsis from pneumonia probably aspiration related    -Assessment and Plan:  1)Sepsis with Septic shock-due to aspiration pneumonia/CAP--POA -Patient met sepsis shock criteria on admission -Weaning off IV Levophed at this time WBC 14.2 >>9.6 -PCT 8.58 UA suggestive of UTI urine culture pending Continue IV cefepime and azithromycin -Consider stopping IV vancomycin if MRSA PCR is negative -Continue IV fluids  2)Acute on chronic Anemia--- heme positive stool and hematemesis Hgb 11.8 >>8.7 -GI consult appreciated -Continue IV Protonix -Possible EGD when respiratory status improved  3)Ileus Versus partial SBO----General surgery consult appreciated -Patient had BM with lactulose and Dulcolax suppository -Favor ileus over SBO  4)AKI----acute kidney injury with hyperkalemia and metabolic acidosis -  creatinine on admission= 2.77 , - baseline creatinine = 0.7 to 0.9   ,  -creatinine is now=1.11  ,  --renally adjust medications, avoid nephrotoxic agents / dehydration  / hypotension  5)DM2--- A1c 5.9 reflecting excellent diabetic control PTA -Hold metformin Use Novolog/Humalog Sliding scale insulin with Accu-Cheks/Fingersticks as ordered   6)HTN--patient with dehydration and septic shock -Continue to hold antihypertensives -Patient also required Levophed for a while  7)Hypothyroidism--- TSH WNL at 1.97 -Continue  levothyroxine  8) hypomagnesemia--magnesium was down to 1.4--- in the setting of vomiting and GI losses -Replace  9) urinary retention and possible UTI -Discontinue Foley catheter that was placed in the ED -Flomax as ordered -Rocephin pending urine culture data  10) anxiety and depression--- stable, continue Zoloft, buspirone  CRITICAL CARE Performed by: Roxan Hockey   Total critical care time: 49 minutes  Critical care time was exclusive of separately billable procedures and treating other patients.  Critical care was necessary to treat or prevent imminent or life-threatening deterioration. - Sepsis with septic shock with hemodynamic instability requiring IV Levophed for pressure support  Critical care was time spent personally by me on the following activities: development of treatment plan with patient and/or surrogate as well as nursing, discussions with consultants, evaluation of patient's response to treatment, examination of patient, obtaining history from patient or surrogate, ordering and performing treatments and interventions, ordering and review of laboratory studies, ordering and review of radiographic studies, pulse oximetry and re-evaluation of patient's condition.   Disposition/Need for in-Hospital Stay- patient unable to be discharged at this time due to *sepsis requiring IV fluids and IV antibiotics -Ileus and hematemesis awaiting better tolerance of oral intake  Status is: Inpatient   Disposition: The patient is from: Home              Anticipated d/c is to: Home              Anticipated d/c date is: > 3 days              Patient currently is not medically stable to d/c. Barriers: Not Clinically Stable-   Code Status : -  Code Status: Full Code   Family Communication:   NA (patient is  alert, awake and coherent)   DVT Prophylaxis  :   - SCDs   Place and maintain sequential compression device Start: 03/07/22 0441   Lab Results  Component Value Date    PLT 185 03/07/2022   Inpatient Medications  Scheduled Meds:  amitriptyline  100 mg Oral QHS   atorvastatin  20 mg Oral Daily   bisacodyl  10 mg Rectal QHS   busPIRone  10 mg Oral TID   Chlorhexidine Gluconate Cloth  6 each Topical Daily   citalopram  40 mg Oral Daily   fluticasone furoate-vilanterol  1 puff Inhalation Daily   gabapentin  300 mg Oral TID   insulin aspart  0-9 Units Subcutaneous Q4H   levothyroxine  25 mcg Oral Q0600   methocarbamol  500 mg Oral TID   methylPREDNISolone (SOLU-MEDROL) injection  40 mg Intravenous Q12H   pantoprazole (PROTONIX) IV  40 mg Intravenous Q12H   polyethylene glycol  17 g Oral BID   tamsulosin  0.4 mg Oral Daily   umeclidinium bromide  1 puff Inhalation Daily   Continuous Infusions:  sodium chloride 125 mL/hr at 03/07/22 1401   azithromycin Stopped (03/07/22 1121)   ceFEPime (MAXIPIME) IV 2 g (03/07/22 1538)   norepinephrine (LEVOPHED) Adult infusion Stopped (03/07/22 0039)   [START ON 03/08/2022] vancomycin     PRN Meds:.albuterol, fentaNYL (SUBLIMAZE) injection, ondansetron **OR** ondansetron (ZOFRAN) IV   Anti-infectives (From admission, onward)    Start     Dose/Rate Route Frequency Ordered Stop   03/08/22 1600  vancomycin (VANCOREADY) IVPB 750 mg/150 mL        750 mg 150 mL/hr over 60 Minutes Intravenous Every 48 hours 03/06/22 1602     03/07/22 0440  azithromycin (ZITHROMAX) 500 mg in sodium chloride 0.9 % 250 mL IVPB        500 mg 250 mL/hr over 60 Minutes Intravenous Every 24 hours 03/07/22 0440     03/06/22 1615  ceFEPIme (MAXIPIME) 2 g in sodium chloride 0.9 % 100 mL IVPB        2 g 200 mL/hr over 30 Minutes Intravenous Every 24 hours 03/06/22 1600     03/06/22 1615  vancomycin (VANCOCIN) IVPB 1000 mg/200 mL premix        1,000 mg 200 mL/hr over 60 Minutes Intravenous  Once 03/06/22 1601 03/06/22 1906       Subjective: [name] today has no fevers, no emesis,  No chest pain,    -Cough and dyspnea  persist  -O2 sats down to 84 to 85% on room air so patient placed back on nasal cannula 2 L -Had soft BM with Dulcolax suppository and p.o. lactulose  Objective: Vitals:   03/07/22 1315 03/07/22 1352 03/07/22 1400 03/07/22 1558  BP: 106/65  94/60   Pulse: 88  87 89  Resp: (!) 8 14 (!) 24 16  Temp:  98.3 F (36.8 C)  98.8 F (37.1 C)  TempSrc:  Oral  Oral  SpO2: 95%  91% (!) 89%  Weight:  55.5 kg    Height:  5' 2"  (1.575 m)      Intake/Output Summary (Last 24 hours) at 03/07/2022 1627 Last data filed at 03/07/2022 1121 Gross per 24 hour  Intake 361.03 ml  Output 2975 ml  Net -2613.97 ml   Filed Weights   03/06/22 1336 03/07/22 1352  Weight: 53.5 kg 55.5 kg    Physical Exam  Gen:- Awake Alert,  in no apparent distress  HEENT:- [place].AT, No sclera  icterus Nose- [place] 2L/min Neck-Supple Neck,No JVD,.  Lungs-diminished breath sounds with scattered rhonchi bilaterally  CV- S1, S2 normal, regular  Abd-  +ve B.Sounds, Abd Soft, No tenderness,    Extremity/Skin:- No  edema, pedal pulses present  Psych-affect is appropriate, oriented x3 Neuro-no new focal deficits, no tremors  Data Reviewed: I have personally reviewed following labs and imaging studies  CBC: Recent Labs  Lab 03/06/22 1432 03/06/22 1754 03/07/22 0435  WBC 14.2*  --  9.6  NEUTROABS 11.6*  --   --   HGB 11.8* 10.0* 8.7*  HCT 37.8 34.7* 28.2*  MCV 90.6  --  91.6  PLT 322  --  902   Basic Metabolic Panel: Recent Labs  Lab 03/06/22 1432 03/06/22 2139 03/07/22 0435  NA 133* 138 139  K 5.9* 4.8 4.8  CL 96* 108 112*  CO2 18* 22 22  GLUCOSE 131* 95 133*  BUN 51* 42* 34*  CREATININE 2.77* 1.73* 1.11*  CALCIUM 7.8* 7.0* 7.5*  MG  --   --  1.4*  PHOS  --   --  3.5   GFR: Estimated Creatinine Clearance: 46.4 mL/min (A) (by C-G formula based on SCr of 1.11 mg/dL (H)). Liver Function Tests: Recent Labs  Lab 03/06/22 1617  AST 41  ALT 27  ALKPHOS 97  BILITOT 0.5  PROT 7.4  ALBUMIN 3.6   Cardiac  Enzymes: No results for input(s): "CKTOTAL", "CKMB", "CKMBINDEX", "TROPONINI" in the last 168 hours. BNP (last 3 results) No results for input(s): "PROBNP" in the last 8760 hours. HbA1C: Recent Labs    03/07/22 0435  HGBA1C 5.9*   Sepsis Labs: @LABRCNTIP (procalcitonin:4,lacticidven:4) ) Recent Results (from the past 240 hour(s))  Culture, blood (Routine x 2)     Status: None (Preliminary result)   Collection Time: 03/06/22  2:53 PM   Specimen: BLOOD RIGHT ARM  Result Value Ref Range Status   Specimen Description   Final    BLOOD RIGHT ARM BOTTLES DRAWN AEROBIC AND ANAEROBIC   Special Requests Blood Culture adequate volume  Final   Culture   Final    NO GROWTH < 12 HOURS Performed at [facility], [address], [place], [place] [address]    Report Status PENDING  Incomplete  Culture, blood (Routine x 2)     Status: None (Preliminary result)   Collection Time: 03/06/22  4:35 PM   Specimen: Left Antecubital; Blood  Result Value Ref Range Status   Specimen Description   Final    LEFT ANTECUBITAL BOTTLES DRAWN AEROBIC AND ANAEROBIC   Special Requests Blood Culture adequate volume  Final   Culture   Final    NO GROWTH < 12 HOURS Performed at [facility], [address], [place], [place] [address]    Report Status PENDING  Incomplete  Resp Panel by RT-PCR (Flu A&B, Covid) Anterior Nasal Swab     Status: None   Collection Time: 03/06/22  9:30 PM   Specimen: Anterior Nasal Swab  Result Value Ref Range Status   SARS Coronavirus 2 by RT PCR NEGATIVE NEGATIVE Final    Comment: (NOTE) SARS-CoV-2 target nucleic acids are NOT DETECTED.  The SARS-CoV-2 RNA is generally detectable in upper respiratory specimens during the acute phase of infection. The lowest concentration of SARS-CoV-2 viral copies this assay can detect is 138 copies/mL. A negative result does not preclude SARS-Cov-2 infection and should not be used as the sole basis for treatment or other patient management  decisions. A negative result may  occur with  improper specimen collection/handling, submission of specimen other than nasopharyngeal swab, presence of viral mutation(s) within the areas targeted by this assay, and inadequate number of viral copies(<138 copies/mL). A negative result must be combined with clinical observations, patient history, and epidemiological information. The expected result is Negative.  Fact Sheet for Patients:  EntrepreneurPulse.com.au  Fact Sheet for Healthcare Providers:  IncredibleEmployment.be  This test is no t yet approved or cleared by the [place] FDA and  has been authorized for detection and/or diagnosis of SARS-CoV-2 by FDA under an Emergency Use Authorization (EUA). This EUA will remain  in effect (meaning this test can be used) for the duration of the COVID-19 declaration under Section 564(b)(1) of the Act, 21 U.S.C.section 360bbb-3(b)(1), unless the authorization is terminated  or revoked sooner.       Influenza A by PCR NEGATIVE NEGATIVE Final   Influenza B by PCR NEGATIVE NEGATIVE Final    Comment: (NOTE) The Xpert Xpress SARS-CoV-2/FLU/RSV plus assay is intended as an aid in the diagnosis of influenza from Nasopharyngeal swab specimens and should not be used as a sole basis for treatment. Nasal washings and aspirates are unacceptable for Xpert Xpress SARS-CoV-2/FLU/RSV testing.  Fact Sheet for Patients: EntrepreneurPulse.com.au  Fact Sheet for Healthcare Providers: IncredibleEmployment.be  This test is not yet approved or cleared by the [place] FDA and has been authorized for detection and/or diagnosis of SARS-CoV-2 by FDA under an Emergency Use Authorization (EUA). This EUA will remain in effect (meaning this test can be used) for the duration of the COVID-19 declaration under Section 564(b)(1) of the Act, 21 U.S.C. section 360bbb-3(b)(1), unless the  authorization is terminated or revoked.  Performed at [facility], [address], [place], [place] [address]       Radiology Studies: CT ABDOMEN PELVIS WO CONTRAST  Result Date: 03/06/2022 CLINICAL DATA:  Abdominal pain, coffee-ground emesis EXAM: CT ABDOMEN AND PELVIS WITHOUT CONTRAST TECHNIQUE: Multidetector CT imaging of the abdomen and pelvis was performed following the standard protocol without IV contrast. RADIATION DOSE REDUCTION: This exam was performed according to the departmental dose-optimization program which includes automated exposure control, adjustment of the mA and/or kV according to patient size and/or use of iterative reconstruction technique. COMPARISON:  02/16/2022 FINDINGS: Lower chest: Patchy multifocal airspace opacities within the lung bases, right worse than left. Normal heart size. Hepatobiliary: No focal liver abnormality is seen. Status post cholecystectomy. No biliary dilatation. Pancreas: Pancreas is atrophic.  No inflammatory changes. Spleen: Normal in size without focal abnormality. Adrenals/Urinary Tract: Adrenal glands are unremarkable. Kidneys are normal, without renal calculi, focal lesion, or hydronephrosis. Bladder is unremarkable. Stomach/Bowel: Moderate-sized hiatal hernia. Chronically thickened gastric folds within the portion of the stomach in the hernia sac. Borderline dilated loops of small bowel within the lower abdomen with air-fluid levels. There is some fecalization of small bowel content. No abrupt transition point is seen. There is a large volume of stool throughout the colon. Normal appendix in the right lower quadrant. No pericolonic inflammatory changes. Vascular/Lymphatic: Scattered aortoiliac atherosclerotic calcifications without aneurysm. No abdominopelvic lymphadenopathy. Reproductive: Status post hysterectomy. No adnexal masses. Other: No free fluid. No abdominopelvic fluid collection. No pneumoperitoneum. No abdominal wall hernia.  Musculoskeletal: Healing changes are noted at the L1 superior endplate compression fracture without progressive height loss. Chronic T12 compression fracture. No new or acute bony findings. IMPRESSION: 1. Borderline dilated loops of small bowel within the lower abdomen with air-fluid levels and some fecalization of small bowel content. No abrupt transition  point is seen. Findings are suggestive of an ileus or enteritis versus developing small bowel obstruction. 2. Large volume of stool throughout the colon suggesting constipation. 3. Patchy multifocal airspace opacities within the lung bases, right worse than left, most compatible with multifocal pneumonia. 4. Moderate-sized hiatal hernia with chronically thickened gastric folds. 5. Healing changes at the L1 superior endplate compression fracture without progressive height loss. Chronic T12 compression fracture. 6. Aortic Atherosclerosis (ICD10-I70.0). Electronically Signed   By: Davina Poke D.O.   On: 03/06/2022 17:55     Scheduled Meds:  amitriptyline  100 mg Oral QHS   atorvastatin  20 mg Oral Daily   bisacodyl  10 mg Rectal QHS   busPIRone  10 mg Oral TID   Chlorhexidine Gluconate Cloth  6 each Topical Daily   citalopram  40 mg Oral Daily   fluticasone furoate-vilanterol  1 puff Inhalation Daily   gabapentin  300 mg Oral TID   insulin aspart  0-9 Units Subcutaneous Q4H   levothyroxine  25 mcg Oral Q0600   methocarbamol  500 mg Oral TID   methylPREDNISolone (SOLU-MEDROL) injection  40 mg Intravenous Q12H   pantoprazole (PROTONIX) IV  40 mg Intravenous Q12H   polyethylene glycol  17 g Oral BID   tamsulosin  0.4 mg Oral Daily   umeclidinium bromide  1 puff Inhalation Daily   Continuous Infusions:  sodium chloride 125 mL/hr at 03/07/22 1401   azithromycin Stopped (03/07/22 1121)   ceFEPime (MAXIPIME) IV 2 g (03/07/22 1538)   norepinephrine (LEVOPHED) Adult infusion Stopped (03/07/22 0039)   [START ON 03/08/2022] vancomycin       LOS:  1 day   Roxan Hockey M.D on 03/07/2022 at 4:27 PM  Go to [email or web address] - for contact info  [facility] - Office  [phone]  If 7PM-7AM, please contact night-coverage [email or web address] 03/07/2022, 4:27 PM

## 2022-03-07 NOTE — ED Notes (Signed)
BP is 97/60 at this time-Levo is NOT running at this time- pt is sleeping- will continue to monitor

## 2022-03-08 DIAGNOSIS — R1084 Generalized abdominal pain: Secondary | ICD-10-CM | POA: Diagnosis not present

## 2022-03-08 DIAGNOSIS — K921 Melena: Secondary | ICD-10-CM | POA: Diagnosis not present

## 2022-03-08 DIAGNOSIS — D62 Acute posthemorrhagic anemia: Secondary | ICD-10-CM | POA: Diagnosis not present

## 2022-03-08 LAB — BASIC METABOLIC PANEL
Anion gap: 3 — ABNORMAL LOW (ref 5–15)
BUN: 16 mg/dL (ref 6–20)
CO2: 22 mmol/L (ref 22–32)
Calcium: 8.2 mg/dL — ABNORMAL LOW (ref 8.9–10.3)
Chloride: 112 mmol/L — ABNORMAL HIGH (ref 98–111)
Creatinine, Ser: 0.6 mg/dL (ref 0.44–1.00)
GFR, Estimated: >60 mL/min (ref >60)
Glucose, Bld: 156 mg/dL — ABNORMAL HIGH (ref 70–99)
Potassium: 4.5 mmol/L (ref 3.5–5.1)
Sodium: 137 mmol/L (ref 135–145)

## 2022-03-08 LAB — CBC
HCT: 28 % — ABNORMAL LOW (ref 36.0–46.0)
Hemoglobin: 8.6 g/dL — ABNORMAL LOW (ref 12.0–15.0)
MCH: 28.4 pg (ref 26.0–34.0)
MCHC: 30.7 g/dL (ref 30.0–36.0)
MCV: 92.4 fL (ref 80.0–100.0)
Platelets: 193 10*3/uL (ref 150–400)
RBC: 3.03 MIL/uL — ABNORMAL LOW (ref 3.87–5.11)
RDW: 22.5 % — ABNORMAL HIGH (ref 11.5–15.5)
WBC: 8.6 10*3/uL (ref 4.0–10.5)
nRBC: 0 % (ref 0.0–0.2)

## 2022-03-08 LAB — URINE CULTURE: Culture: NO GROWTH

## 2022-03-08 LAB — GLUCOSE, CAPILLARY
Glucose-Capillary: 132 mg/dL — ABNORMAL HIGH (ref 70–99)
Glucose-Capillary: 231 mg/dL — ABNORMAL HIGH (ref 70–99)
Glucose-Capillary: 180 mg/dL — ABNORMAL HIGH (ref 70–99)
Glucose-Capillary: 164 mg/dL — ABNORMAL HIGH (ref 70–99)

## 2022-03-08 MED ORDER — SODIUM CHLORIDE 0.9 % IV SOLN
2.0000 g | Freq: Three times a day (TID) | INTRAVENOUS | Status: DC
  Administered 2022-03-08 – 2022-03-09 (×4): 2 g via INTRAVENOUS
  Filled 2022-03-08 (×4): qty 12.5

## 2022-03-08 MED ORDER — OXYCODONE HCL 5 MG PO TABS
10.0000 mg | ORAL_TABLET | ORAL | Status: DC | PRN
  Administered 2022-03-08 – 2022-03-13 (×17): 10 mg via ORAL
  Filled 2022-03-08 (×17): qty 2

## 2022-03-08 MED ORDER — CYANOCOBALAMIN 1000 MCG/ML IJ SOLN
1000.0000 ug | Freq: Once | INTRAMUSCULAR | Status: AC
  Administered 2022-03-08: 1000 ug via INTRAMUSCULAR
  Filled 2022-03-08: qty 1

## 2022-03-08 NOTE — Progress Notes (Signed)
Subjective: Patient had multiple bowel movements yesterday.  She feels much better.  She denies any abdominal pain.  Objective: Vital signs in last 24 hours: Temp:  [97.3 F (36.3 C)-98.8 F (37.1 C)] 97.3 F (36.3 C) (08/13 0742) Pulse Rate:  [72-105] 80 (08/13 0742) Resp:  [7-24] 14 (08/13 0742) BP: (89-133)/(39-78) 124/72 (08/13 0200) SpO2:  [89 %-99 %] 96 % (08/13 0826) Weight:  [55.5 kg-58 kg] 58 kg (08/13 0521) Last BM Date : 03/07/22  Intake/Output from previous day: 08/12 0701 - 08/13 0700 In: 2706.5 [I.V.:2152; IV Piggyback:554.5] Out: 1600 [Urine:1600] Intake/Output this shift: No intake/output data recorded.  General appearance: alert, cooperative, and no distress GI: soft, non-tender; bowel sounds normal; no masses,  no organomegaly  Lab Results:  Recent Labs    03/07/22 0435 03/08/22 0356  WBC 9.6 8.6  HGB 8.7* 8.6*  HCT 28.2* 28.0*  PLT 185 193   BMET Recent Labs    03/07/22 0435 03/08/22 0356  NA 139 137  K 4.8 4.5  CL 112* 112*  CO2 22 22  GLUCOSE 133* 156*  BUN 34* 16  CREATININE 1.11* 0.60  CALCIUM 7.5* 8.2*   PT/INR Recent Labs    03/06/22 1617  LABPROT 11.4  INR 0.8    Studies/Results: CT ABDOMEN PELVIS WO CONTRAST  Result Date: 03/06/2022 CLINICAL DATA:  Abdominal pain, coffee-ground emesis EXAM: CT ABDOMEN AND PELVIS WITHOUT CONTRAST TECHNIQUE: Multidetector CT imaging of the abdomen and pelvis was performed following the standard protocol without IV contrast. RADIATION DOSE REDUCTION: This exam was performed according to the departmental dose-optimization program which includes automated exposure control, adjustment of the mA and/or kV according to patient size and/or use of iterative reconstruction technique. COMPARISON:  02/16/2022 FINDINGS: Lower chest: Patchy multifocal airspace opacities within the lung bases, right worse than left. Normal heart size. Hepatobiliary: No focal liver abnormality is seen. Status post  cholecystectomy. No biliary dilatation. Pancreas: Pancreas is atrophic.  No inflammatory changes. Spleen: Normal in size without focal abnormality. Adrenals/Urinary Tract: Adrenal glands are unremarkable. Kidneys are normal, without renal calculi, focal lesion, or hydronephrosis. Bladder is unremarkable. Stomach/Bowel: Moderate-sized hiatal hernia. Chronically thickened gastric folds within the portion of the stomach in the hernia sac. Borderline dilated loops of small bowel within the lower abdomen with air-fluid levels. There is some fecalization of small bowel content. No abrupt transition point is seen. There is a large volume of stool throughout the colon. Normal appendix in the right lower quadrant. No pericolonic inflammatory changes. Vascular/Lymphatic: Scattered aortoiliac atherosclerotic calcifications without aneurysm. No abdominopelvic lymphadenopathy. Reproductive: Status post hysterectomy. No adnexal masses. Other: No free fluid. No abdominopelvic fluid collection. No pneumoperitoneum. No abdominal wall hernia. Musculoskeletal: Healing changes are noted at the L1 superior endplate compression fracture without progressive height loss. Chronic T12 compression fracture. No new or acute bony findings. IMPRESSION: 1. Borderline dilated loops of small bowel within the lower abdomen with air-fluid levels and some fecalization of small bowel content. No abrupt transition point is seen. Findings are suggestive of an ileus or enteritis versus developing small bowel obstruction. 2. Large volume of stool throughout the colon suggesting constipation. 3. Patchy multifocal airspace opacities within the lung bases, right worse than left, most compatible with multifocal pneumonia. 4. Moderate-sized hiatal hernia with chronically thickened gastric folds. 5. Healing changes at the L1 superior endplate compression fracture without progressive height loss. Chronic T12 compression fracture. 6. Aortic Atherosclerosis  (ICD10-I70.0). Electronically Signed   By: Davina Poke D.O.   On: 03/06/2022  17:55    Anti-infectives: Anti-infectives (From admission, onward)    Start     Dose/Rate Route Frequency Ordered Stop   03/08/22 1600  vancomycin (VANCOREADY) IVPB 750 mg/150 mL  Status:  Discontinued        750 mg 150 mL/hr over 60 Minutes Intravenous Every 48 hours 03/06/22 1602 03/08/22 0812   03/08/22 1400  ceFEPIme (MAXIPIME) 2 g in sodium chloride 0.9 % 100 mL IVPB        2 g 200 mL/hr over 30 Minutes Intravenous Every 8 hours 03/08/22 0808     03/07/22 0440  azithromycin (ZITHROMAX) 500 mg in sodium chloride 0.9 % 250 mL IVPB        500 mg 250 mL/hr over 60 Minutes Intravenous Every 24 hours 03/07/22 0440     03/06/22 1615  ceFEPIme (MAXIPIME) 2 g in sodium chloride 0.9 % 100 mL IVPB  Status:  Discontinued        2 g 200 mL/hr over 30 Minutes Intravenous Every 24 hours 03/06/22 1600 03/08/22 0808   03/06/22 1615  vancomycin (VANCOCIN) IVPB 1000 mg/200 mL premix        1,000 mg 200 mL/hr over 60 Minutes Intravenous  Once 03/06/22 1601 03/06/22 1906       Assessment/Plan: Impression: Constipation resolved.  No clinical evidence of small bowel obstruction at the present time.  Tolerating diet well. Plan: For constipation probably was from narcotic use to her back injury.  I would place her on MiraLAX daily during this period.  Discussed with Dr. Denton Brick.  We will sign off.  Please call us if we can be of further assistance.  LOS: 2 days    Aviva Signs 03/08/2022

## 2022-03-08 NOTE — Progress Notes (Addendum)
PROGRESS NOTE     [name], is a 51 y.o. female, DOB - Jun 14, 1971, HLK:[id number]  Admit date - 03/06/2022   Admitting Physician Truett Mainland, DO  Outpatient Primary MD for the patient is Redmond School, MD  LOS - 2  Chief Complaint  Patient presents with   Back Pain      Brief Narrative:  51 y.o. female with medical history significant of type 2 diabetes, hypothyroidism, COPD/asthma, hyperlipidemia, chronic pancreatitis admitted on 03/06/2022 with concerns for acute GI bleed and possible ileus versus partial small bowel obstruction as well as sepsis from pneumonia probably aspiration related    -Assessment and Plan:  1)Sepsis with Septic shock-due to aspiration pneumonia/CAP--POA -Patient met sepsis shock criteria on admission -Weaned off IV Levophed completely WBC 14.2 >>9.6>>8.6 -PCT 8.58 -Urine and blood cultures NGTD Continue IV cefepime and azithromycin -Consider stopping IV vancomycin if MRSA PCR is negative -Continue IV fluids -Patient had acute hypoxic respiratory failure--- hypoxia appears to have resolved patient is now on room air --Repeat x-rays on 03/09/2022  2)Acute on chronic Anemia--- heme positive stool and hematemesis -Events work-up was consistent with iron and B12 deficiency Hgb 11.8 >>8.7>>8.6 -GI consult appreciated -Continue IV Protonix -Possible EGD when respiratory status improved -Patient is due for her B12 shot this will be given today -Patient gets outpatient iron infusion she can continue this postdischarge  3)Ileus Versus partial SBO----General surgery consult appreciated --multiple BMs with miralax, lactulose and Dulcolax suppository -Clinically patient appears to have resolved ileus -General surgery input appreciated  4)AKI----acute kidney injury with hyperkalemia and metabolic acidosis -  creatinine on admission= 2.77 , - baseline creatinine = 0.7 to 0.9   ,  -creatinine has now normalized,  --renally adjust medications,  avoid nephrotoxic agents / dehydration  / hypotension  5)DM2--- A1c 5.9 reflecting excellent diabetic control PTA -Hold metformin Use Novolog/Humalog Sliding scale insulin with Accu-Cheks/Fingersticks as ordered   6)HTN--patient with dehydration and septic shock -Continue to hold antihypertensives -Patient has been weaned off Levophed completely  7)Hypothyroidism--- TSH WNL at 1.97 -Continue levothyroxine  8) hypomagnesemia- --- in the setting of vomiting and GI losses -Replaced  9) urinary retention and possible UTI -Discontinued Foley catheter that was placed in the ED -c/n Flomax   -Patient is voiding okay -Rocephin pending urine culture data  10) anxiety and depression--- stable, continue Zoloft, buspirone  12) subacute back pain--- patient has had back pain for the last couple weeks  -be judicious with opiates due to concerns about ileus/SBO -She was scheduled for outpatient work-up by her neurosurgeon apparently in [place] -Follow-up with neurosurgeon/orthopedic surgeon as outpatient for MRI and further evaluation  Disposition/Need for in-Hospital Stay- patient unable to be discharged at this time due to *sepsis requiring IV fluids and IV antibiotics  Status is: Inpatient   Disposition: The patient is from: Home              Anticipated d/c is to: Home              Anticipated d/c date is: 1 day              Patient currently is not medically stable to d/c. Barriers: Not Clinically Stable-   Code Status : -  Code Status: Full Code   Family Communication:   NA (patient is alert, awake and coherent)   DVT Prophylaxis  :   - SCDs   Place and maintain sequential compression device Start: 03/07/22 0441   Lab Results  Component  Value Date   PLT 193 03/08/2022   Inpatient Medications  Scheduled Meds:  amitriptyline  100 mg Oral QHS   atorvastatin  20 mg Oral Daily   bisacodyl  10 mg Rectal QHS   busPIRone  10 mg Oral TID   Chlorhexidine Gluconate Cloth  6 each  Topical Daily   citalopram  40 mg Oral Daily   cyanocobalamin  1,000 mcg Intramuscular Once   fluticasone furoate-vilanterol  1 puff Inhalation Daily   gabapentin  300 mg Oral TID   insulin aspart  0-5 Units Subcutaneous QHS   insulin aspart  0-9 Units Subcutaneous TID WC   levothyroxine  25 mcg Oral Q0600   methocarbamol  500 mg Oral TID   methylPREDNISolone (SOLU-MEDROL) injection  40 mg Intravenous Q12H   pantoprazole (PROTONIX) IV  40 mg Intravenous Q12H   pneumococcal 20-valent conjugate vaccine  0.5 mL Intramuscular Tomorrow-1000   polyethylene glycol  17 g Oral BID   tamsulosin  0.4 mg Oral Daily   umeclidinium bromide  1 puff Inhalation Daily   Continuous Infusions:  sodium chloride Stopped (03/07/22 1538)   azithromycin Stopped (03/08/22 0508)   ceFEPime (MAXIPIME) IV     norepinephrine (LEVOPHED) Adult infusion Stopped (03/07/22 0037)   PRN Meds:.albuterol, fentaNYL (SUBLIMAZE) injection, ondansetron **OR** ondansetron (ZOFRAN) IV, oxyCODONE   Anti-infectives (From admission, onward)    Start     Dose/Rate Route Frequency Ordered Stop   03/08/22 1600  vancomycin (VANCOREADY) IVPB 750 mg/150 mL  Status:  Discontinued        750 mg 150 mL/hr over 60 Minutes Intravenous Every 48 hours 03/06/22 1602 03/08/22 0812   03/08/22 1400  ceFEPIme (MAXIPIME) 2 g in sodium chloride 0.9 % 100 mL IVPB        2 g 200 mL/hr over 30 Minutes Intravenous Every 8 hours 03/08/22 0808     03/07/22 0440  azithromycin (ZITHROMAX) 500 mg in sodium chloride 0.9 % 250 mL IVPB        500 mg 250 mL/hr over 60 Minutes Intravenous Every 24 hours 03/07/22 0440     03/06/22 1615  ceFEPIme (MAXIPIME) 2 g in sodium chloride 0.9 % 100 mL IVPB  Status:  Discontinued        2 g 200 mL/hr over 30 Minutes Intravenous Every 24 hours 03/06/22 1600 03/08/22 0808   03/06/22 1615  vancomycin (VANCOCIN) IVPB 1000 mg/200 mL premix        1,000 mg 200 mL/hr over 60 Minutes Intravenous  Once 03/06/22 1601 03/06/22  1906       Subjective: Weltha Ogborn today has no fevers, no emesis,  No chest pain,   - -Had multiple BMs after Dulcolax suppository along with MiraLAX -Passing gas -Eating and drinking okay -Cough and shortness of breath improving -No longer requiring IV Levophed  Objective: Vitals:   03/08/22 0826 03/08/22 0900 03/08/22 1110 03/08/22 1200  BP:  107/60  138/75  Pulse:  93 70 71  Resp:  10 12 13   Temp:   98.4 F (36.9 C)   TempSrc:   Oral   SpO2: 96% 98% 95% 97%  Weight:      Height:        Intake/Output Summary (Last 24 hours) at 03/08/2022 1304 Last data filed at 03/08/2022 1235 Gross per 24 hour  Intake 2595.44 ml  Output --  Net 2595.44 ml   Filed Weights   03/06/22 1336 03/07/22 1352 03/08/22 0521  Weight: 53.5 kg 55.5 kg 58  kg   Physical Exam  Gen:- Awake Alert,  in no apparent distress  HEENT:- [place].AT, No sclera icterus Neck-Supple Neck,No JVD,.  Lungs-improved air movement, no wheezing  CV- S1, S2 normal, regular  Abd-  +ve B.Sounds, Abd Soft, No tenderness,    Extremity/Skin:- No  edema, pedal pulses present  Psych-affect is appropriate, oriented x3 Neuro-no new focal deficits, no tremors  Data Reviewed: I have personally reviewed following labs and imaging studies  CBC: Recent Labs  Lab 03/06/22 1432 03/06/22 1754 03/07/22 0435 03/08/22 0356  WBC 14.2*  --  9.6 8.6  NEUTROABS 11.6*  --   --   --   HGB 11.8* 10.0* 8.7* 8.6*  HCT 37.8 34.7* 28.2* 28.0*  MCV 90.6  --  91.6 92.4  PLT 322  --  185 497   Basic Metabolic Panel: Recent Labs  Lab 03/06/22 1432 03/06/22 2139 03/07/22 0435 03/08/22 0356  NA 133* 138 139 137  K 5.9* 4.8 4.8 4.5  CL 96* 108 112* 112*  CO2 18* 22 22 22   GLUCOSE 131* 95 133* 156*  BUN 51* 42* 34* 16  CREATININE 2.77* 1.73* 1.11* 0.60  CALCIUM 7.8* 7.0* 7.5* 8.2*  MG  --   --  1.4*  --   PHOS  --   --  3.5  --    GFR: Estimated Creatinine Clearance: 64.3 mL/min (by C-G formula based on SCr of 0.6  mg/dL). Liver Function Tests: Recent Labs  Lab 03/06/22 1617  AST 41  ALT 27  ALKPHOS 97  BILITOT 0.5  PROT 7.4  ALBUMIN 3.6   HbA1C: Recent Labs    03/07/22 0435  HGBA1C 5.9*   Sepsis Labs: @LABRCNTIP (procalcitonin:4,lacticidven:4) ) Recent Results (from the past 240 hour(s))  Culture, blood (Routine x 2)     Status: None (Preliminary result)   Collection Time: 03/06/22  2:53 PM   Specimen: BLOOD RIGHT ARM  Result Value Ref Range Status   Specimen Description   Final    BLOOD RIGHT ARM BOTTLES DRAWN AEROBIC AND ANAEROBIC   Special Requests Blood Culture adequate volume  Final   Culture   Final    NO GROWTH 2 DAYS Performed at [facility], [address] [place], [place] [address]    Report Status PENDING  Incomplete  Culture, blood (Routine x 2)     Status: None (Preliminary result)   Collection Time: 03/06/22  4:35 PM   Specimen: Left Antecubital; Blood  Result Value Ref Range Status   Specimen Description   Final    LEFT ANTECUBITAL BOTTLES DRAWN AEROBIC AND ANAEROBIC   Special Requests Blood Culture adequate volume  Final   Culture  Setup Time   Final    GRAM POSITIVE RODS AEROBIC BOTTLE ONLY Gram Stain Report Called to,Read Back By and Verified With: Morton Peters 2150 03/07/22 BY [facility] WORK DONE AT [facility]    Culture   Final    NO GROWTH 2 DAYS Performed at [facility], [address] [place], [place] [address]    Report Status PENDING  Incomplete  Urine Culture     Status: None   Collection Time: 03/06/22  6:38 PM   Specimen: In/Out Cath Urine  Result Value Ref Range Status   Specimen Description   Final    IN/OUT CATH URINE Performed at [facility], [address] [place], [place] [address]    Special Requests   Final    NONE Performed at [facility], [address], [place],  [place] [address]    Culture   Final    NO GROWTH Performed at [facility], [place] [address], [place], [place] [address]    Report Status 03/08/2022 FINAL  Final   Resp Panel by RT-PCR (Flu A&B, Covid) Anterior Nasal Swab     Status: None   Collection Time: 03/06/22  9:30 PM   Specimen: Anterior Nasal Swab  Result Value Ref Range Status   SARS Coronavirus 2 by RT PCR NEGATIVE NEGATIVE Final    Comment: (NOTE) SARS-CoV-2 target nucleic acids are NOT DETECTED.  The SARS-CoV-2 RNA is generally detectable in upper respiratory specimens during the acute phase of infection. The lowest concentration of SARS-CoV-2 viral copies this assay can detect is 138 copies/mL. A negative result does not preclude SARS-Cov-2 infection and should not be used as the sole basis for treatment or other patient management decisions. A negative result may occur with  improper specimen collection/handling, submission of specimen other than nasopharyngeal swab, presence of viral mutation(s) within the areas targeted by this assay, and inadequate number of viral copies(<138 copies/mL). A negative result must be combined with clinical observations, patient history, and epidemiological information. The expected result is Negative.  Fact Sheet for Patients:  EntrepreneurPulse.com.au  Fact Sheet for Healthcare Providers:  IncredibleEmployment.be  This test is no t yet approved or cleared by the [place] FDA and  has been authorized for detection and/or diagnosis of SARS-CoV-2 by FDA under an Emergency Use Authorization (EUA). This EUA will remain  in effect (meaning this test can be used) for the duration of the COVID-19 declaration under Section 564(b)(1) of the Act, 21 U.S.C.section 360bbb-3(b)(1), unless the authorization is terminated  or revoked sooner.       Influenza A by PCR NEGATIVE NEGATIVE Final   Influenza B by PCR NEGATIVE NEGATIVE Final    Comment: (NOTE) The Xpert Xpress SARS-CoV-2/FLU/RSV plus assay is intended as an aid in the diagnosis of influenza from Nasopharyngeal swab specimens and should not be used as a  sole basis for treatment. Nasal washings and aspirates are unacceptable for Xpert Xpress SARS-CoV-2/FLU/RSV testing.  Fact Sheet for Patients: EntrepreneurPulse.com.au  Fact Sheet for Healthcare Providers: IncredibleEmployment.be  This test is not yet approved or cleared by the [place] FDA and has been authorized for detection and/or diagnosis of SARS-CoV-2 by FDA under an Emergency Use Authorization (EUA). This EUA will remain in effect (meaning this test can be used) for the duration of the COVID-19 declaration under Section 564(b)(1) of the Act, 21 U.S.C. section 360bbb-3(b)(1), unless the authorization is terminated or revoked.  Performed at [facility], [address], [place], [place] [address]   MRSA Next Gen by PCR, Nasal     Status: None   Collection Time: 03/07/22  3:42 PM   Specimen: Nasal Mucosa; Nasal Swab  Result Value Ref Range Status   MRSA by PCR Next Gen NOT DETECTED NOT DETECTED Final    Comment: (NOTE) The GeneXpert MRSA Assay (FDA approved for NASAL specimens only), is one component of a comprehensive MRSA colonization surveillance program. It is not intended to diagnose MRSA infection nor to guide or monitor treatment for MRSA infections. Test performance is not FDA approved in patients less than 98 years old. Performed at [facility], [address], [place], [place] [address]       Radiology Studies: CT ABDOMEN PELVIS WO CONTRAST  Result Date: 03/06/2022 CLINICAL DATA:  Abdominal pain, coffee-ground emesis EXAM: CT ABDOMEN AND PELVIS WITHOUT CONTRAST  TECHNIQUE: Multidetector CT imaging of the abdomen and pelvis was performed following the standard protocol without IV contrast. RADIATION DOSE REDUCTION: This exam was performed according to the departmental dose-optimization program which includes automated exposure control, adjustment of the mA and/or kV according to patient size and/or use of iterative  reconstruction technique. COMPARISON:  02/16/2022 FINDINGS: Lower chest: Patchy multifocal airspace opacities within the lung bases, right worse than left. Normal heart size. Hepatobiliary: No focal liver abnormality is seen. Status post cholecystectomy. No biliary dilatation. Pancreas: Pancreas is atrophic.  No inflammatory changes. Spleen: Normal in size without focal abnormality. Adrenals/Urinary Tract: Adrenal glands are unremarkable. Kidneys are normal, without renal calculi, focal lesion, or hydronephrosis. Bladder is unremarkable. Stomach/Bowel: Moderate-sized hiatal hernia. Chronically thickened gastric folds within the portion of the stomach in the hernia sac. Borderline dilated loops of small bowel within the lower abdomen with air-fluid levels. There is some fecalization of small bowel content. No abrupt transition point is seen. There is a large volume of stool throughout the colon. Normal appendix in the right lower quadrant. No pericolonic inflammatory changes. Vascular/Lymphatic: Scattered aortoiliac atherosclerotic calcifications without aneurysm. No abdominopelvic lymphadenopathy. Reproductive: Status post hysterectomy. No adnexal masses. Other: No free fluid. No abdominopelvic fluid collection. No pneumoperitoneum. No abdominal wall hernia. Musculoskeletal: Healing changes are noted at the L1 superior endplate compression fracture without progressive height loss. Chronic T12 compression fracture. No new or acute bony findings. IMPRESSION: 1. Borderline dilated loops of small bowel within the lower abdomen with air-fluid levels and some fecalization of small bowel content. No abrupt transition point is seen. Findings are suggestive of an ileus or enteritis versus developing small bowel obstruction. 2. Large volume of stool throughout the colon suggesting constipation. 3. Patchy multifocal airspace opacities within the lung bases, right worse than left, most compatible with multifocal pneumonia. 4.  Moderate-sized hiatal hernia with chronically thickened gastric folds. 5. Healing changes at the L1 superior endplate compression fracture without progressive height loss. Chronic T12 compression fracture. 6. Aortic Atherosclerosis (ICD10-I70.0). Electronically Signed   By: Davina Poke D.O.   On: 03/06/2022 17:55     Scheduled Meds:  amitriptyline  100 mg Oral QHS   atorvastatin  20 mg Oral Daily   bisacodyl  10 mg Rectal QHS   busPIRone  10 mg Oral TID   Chlorhexidine Gluconate Cloth  6 each Topical Daily   citalopram  40 mg Oral Daily   cyanocobalamin  1,000 mcg Intramuscular Once   fluticasone furoate-vilanterol  1 puff Inhalation Daily   gabapentin  300 mg Oral TID   insulin aspart  0-5 Units Subcutaneous QHS   insulin aspart  0-9 Units Subcutaneous TID WC   levothyroxine  25 mcg Oral Q0600   methocarbamol  500 mg Oral TID   methylPREDNISolone (SOLU-MEDROL) injection  40 mg Intravenous Q12H   pantoprazole (PROTONIX) IV  40 mg Intravenous Q12H   pneumococcal 20-valent conjugate vaccine  0.5 mL Intramuscular Tomorrow-1000   polyethylene glycol  17 g Oral BID   tamsulosin  0.4 mg Oral Daily   umeclidinium bromide  1 puff Inhalation Daily   Continuous Infusions:  sodium chloride Stopped (03/07/22 1538)   azithromycin Stopped (03/08/22 0508)   ceFEPime (MAXIPIME) IV     norepinephrine (LEVOPHED) Adult infusion Stopped (03/07/22 0037)    LOS: 2 days   Roxan Hockey M.D on 03/08/2022 at 1:04 PM  Go to [email or web address] - for contact info  [facility] - Office  [phone]  If 7PM-7AM, please contact night-coverage  [email or web address] 03/08/2022, 1:04 PM

## 2022-03-08 NOTE — Progress Notes (Signed)
Subjective: Patient doing better today.  Has had multiple bowel movements on Dulcolax suppository.  Abdominal pain improved.  Does note some nausea, tolerating heart healthy diet.  Ate breakfast this morning.  No vomiting.  Hemoglobin stable  Objective: Vital signs in last 24 hours: Temp:  [97.3 F (36.3 C)-98.8 F (37.1 C)] 97.3 F (36.3 C) (08/13 0742) Pulse Rate:  [72-105] 80 (08/13 0742) Resp:  [7-24] 14 (08/13 0742) BP: (89-133)/(39-78) 124/72 (08/13 0200) SpO2:  [89 %-99 %] 96 % (08/13 0826) Weight:  [55.5 kg-58 kg] 58 kg (08/13 0521) Last BM Date : 03/07/22 General:   Alert and oriented, pleasant Head:  Normocephalic and atraumatic. Eyes:  No icterus, sclera clear. Conjuctiva pink.  Abdomen:  Bowel sounds present, soft, non-tender, non-distended. No HSM or hernias noted. No rebound or guarding. No masses appreciated  Msk:  Symmetrical without gross deformities. Normal posture. Extremities:  Without clubbing or edema. Neurologic:  Alert and  oriented x4;  grossly normal neurologically. Skin:  Warm and dry, intact without significant lesions.  Cervical Nodes:  No significant cervical adenopathy. Psych:  Alert and cooperative. Normal mood and affect.  Intake/Output from previous day: 08/12 0701 - 08/13 0700 In: 2706.5 [I.V.:2152; IV Piggyback:554.5] Out: 1600 [Urine:1600] Intake/Output this shift: No intake/output data recorded.  Lab Results: Recent Labs    03/06/22 1432 03/06/22 1754 03/07/22 0435 03/08/22 0356  WBC 14.2*  --  9.6 8.6  HGB 11.8* 10.0* 8.7* 8.6*  HCT 37.8 34.7* 28.2* 28.0*  PLT 322  --  185 193   BMET Recent Labs    03/06/22 2139 03/07/22 0435 03/08/22 0356  NA 138 139 137  K 4.8 4.8 4.5  CL 108 112* 112*  CO2 '22 22 22  '$ GLUCOSE 95 133* 156*  BUN 42* 34* 16  CREATININE 1.73* 1.11* 0.60  CALCIUM 7.0* 7.5* 8.2*   LFT Recent Labs    03/06/22 1617  PROT 7.4  ALBUMIN 3.6  AST 41  ALT 27  ALKPHOS 97  BILITOT 0.5  BILIDIR 0.1  IBILI  0.4   PT/INR Recent Labs    03/06/22 1617  LABPROT 11.4  INR 0.8   Hepatitis Panel No results for input(s): "HEPBSAG", "HCVAB", "HEPAIGM", "HEPBIGM" in the last 72 hours.   Studies/Results: CT ABDOMEN PELVIS WO CONTRAST  Result Date: 03/06/2022 CLINICAL DATA:  Abdominal pain, coffee-ground emesis EXAM: CT ABDOMEN AND PELVIS WITHOUT CONTRAST TECHNIQUE: Multidetector CT imaging of the abdomen and pelvis was performed following the standard protocol without IV contrast. RADIATION DOSE REDUCTION: This exam was performed according to the departmental dose-optimization program which includes automated exposure control, adjustment of the mA and/or kV according to patient size and/or use of iterative reconstruction technique. COMPARISON:  02/16/2022 FINDINGS: Lower chest: Patchy multifocal airspace opacities within the lung bases, right worse than left. Normal heart size. Hepatobiliary: No focal liver abnormality is seen. Status post cholecystectomy. No biliary dilatation. Pancreas: Pancreas is atrophic.  No inflammatory changes. Spleen: Normal in size without focal abnormality. Adrenals/Urinary Tract: Adrenal glands are unremarkable. Kidneys are normal, without renal calculi, focal lesion, or hydronephrosis. Bladder is unremarkable. Stomach/Bowel: Moderate-sized hiatal hernia. Chronically thickened gastric folds within the portion of the stomach in the hernia sac. Borderline dilated loops of small bowel within the lower abdomen with air-fluid levels. There is some fecalization of small bowel content. No abrupt transition point is seen. There is a large volume of stool throughout the colon. Normal appendix in the right lower quadrant. No pericolonic inflammatory changes. Vascular/Lymphatic: Scattered  aortoiliac atherosclerotic calcifications without aneurysm. No abdominopelvic lymphadenopathy. Reproductive: Status post hysterectomy. No adnexal masses. Other: No free fluid. No abdominopelvic fluid collection.  No pneumoperitoneum. No abdominal wall hernia. Musculoskeletal: Healing changes are noted at the L1 superior endplate compression fracture without progressive height loss. Chronic T12 compression fracture. No new or acute bony findings. IMPRESSION: 1. Borderline dilated loops of small bowel within the lower abdomen with air-fluid levels and some fecalization of small bowel content. No abrupt transition point is seen. Findings are suggestive of an ileus or enteritis versus developing small bowel obstruction. 2. Large volume of stool throughout the colon suggesting constipation. 3. Patchy multifocal airspace opacities within the lung bases, right worse than left, most compatible with multifocal pneumonia. 4. Moderate-sized hiatal hernia with chronically thickened gastric folds. 5. Healing changes at the L1 superior endplate compression fracture without progressive height loss. Chronic T12 compression fracture. 6. Aortic Atherosclerosis (ICD10-I70.0). Electronically Signed   By: Davina Poke D.O.   On: 03/06/2022 17:55    Assessment: *Coffee-ground emesis/upper GI bleed *Acute blood loss anemia due to above *Sepsis with multifocal pneumonia *Ileus versus partial small bowel obstruction   Plan: Discussed case in depth with patient today.  Hemoglobin stable without further evidence of overt GI bleeding.  Okay to continue monitor for now.  Tolerating diet.  Continue on IV Protonix twice daily.  Avoid NSAIDs   Monitor H&H and transfuse for less than 7.   Currently on heart healthy diet.   Ileus improved status post multiple bowel movements.  Continue bowel regimen.     Elon Alas. Abbey Chatters, D.O. Gastroenterology and Hepatology [facility]   LOS: 2 days    03/08/2022, 11:06 AM

## 2022-03-08 NOTE — Progress Notes (Addendum)
Pharmacy Antibiotic Note  [name] a 51 y.o. female admitted on 03/08/2022 with sepsis.  Pharmacy has been consulted for vancomycin and cefepime dosing.  Plan: Vancomycin '750mg'$  IV every 12 hours.  Goal trough 15-20 mcg/mL. Cefepime 2gm IV every 8 hours.  Medical History: Past Medical History:  Diagnosis Date   Anemia    Anxiety    Asthma    Bipolar 1 disorder ([place])    Chronic abdominal pain    COPD (chronic obstructive pulmonary disease) (HCC)    Depression    Diabetes mellitus    Diabetic neuropathy ([place]) 10/29/2017   Esophagitis    Hiatal hernia    Hyperlipidemia 02/03/2012   Iron deficiency anemia due to chronic blood loss 01/29/2022   Migraine    Neck pain 06/08/2012   Pancreatitis chronic Idiopathic   Treated by Dr. Newman Pies at [facility] in the past with celiac blocks.    Allergies:  Allergies  Allergen Reactions   Penicillins Shortness Of Breath     Has patient had a PCN reaction causing immediate rash, facial/tongue/throat swelling, SOB or lightheadedness with hypotension: Yes Has patient had a PCN reaction causing severe rash involving mucus membranes or skin necrosis: Yes Has patient had a PCN reaction that required hospitalization No Has patient had a PCN reaction occurring within the last 10 years: No  If all of the above answers are "NO", then may proceed with Cephalosporin use. Tolerated ceftriaxone   Sulfa Antibiotics Shortness Of Breath   Aspirin Nausea And Vomiting   Enoxaparin Other (See Comments)    Has been known to cause her blood clots in her legs Has been known to cause her blood clots in her legs   Lovenox  [Enoxaparin Sodium] Other (See Comments)    Has been known to cause her blood clots in her legs   Prednisone Other (See Comments)    Pancreatitis, but has to take for asthma sometimes    Filed Weights   03/06/22 1336 03/07/22 1352 03/08/22 0521  Weight: 53.5 kg (118 lb) 55.5 kg (122 lb 5.7 oz) 58 kg (127 lb 13.9 oz)        Latest Ref Rng & Units 03/08/2022    3:56 AM 03/07/2022    4:35 AM 03/06/2022    5:54 PM  CBC  WBC 4.0 - 10.5 K/uL 8.6  9.6    Hemoglobin 12.0 - 15.0 g/dL 8.6  8.7  10.0   Hematocrit 36.0 - 46.0 % 28.0  28.2  34.7   Platelets 150 - 400 K/uL 193  185       Estimated Creatinine Clearance: 64.3 mL/min (by C-G formula based on SCr of 0.6 mg/dL).  Antibiotics Given (last 72 hours)     Date/Time Action Medication Dose Rate   03/06/22 1645 New Bag/Given   ceFEPIme (MAXIPIME) 2 g in sodium chloride 0.9 % 100 mL IVPB 2 g 200 mL/hr   03/06/22 1721 New Bag/Given   vancomycin (VANCOCIN) IVPB 1000 mg/200 mL premix 1,000 mg 200 mL/hr   03/07/22 3086 New Bag/Given   azithromycin (ZITHROMAX) 500 mg in sodium chloride 0.9 % 250 mL IVPB 500 mg 250 mL/hr   03/07/22 1538 New Bag/Given   ceFEPIme (MAXIPIME) 2 g in sodium chloride 0.9 % 100 mL IVPB 2 g 200 mL/hr   03/08/22 0408 New Bag/Given   azithromycin (ZITHROMAX) 500 mg in sodium chloride 0.9 % 250 mL IVPB 500 mg 250 mL/hr       Antimicrobials this admission:  Cefepime  03/06/2022  >>  vancomycin 03/06/2022  >>  Azithromycin 8/12 >>  Microbiology results: 03/06/2022  BCx: NGTD 03/06/2022 Resp Panel: negative   Thank you for allowing pharmacy to be a part of this patient's care.  Thomasenia Sales, PharmD Clinical Pharmacist

## 2022-03-09 ENCOUNTER — Inpatient Hospital Stay (HOSPITAL_COMMUNITY): Payer: Medicare Other

## 2022-03-09 ENCOUNTER — Encounter (HOSPITAL_COMMUNITY): Payer: Self-pay | Admitting: Radiology

## 2022-03-09 DIAGNOSIS — J9601 Acute respiratory failure with hypoxia: Secondary | ICD-10-CM | POA: Diagnosis not present

## 2022-03-09 DIAGNOSIS — D62 Acute posthemorrhagic anemia: Secondary | ICD-10-CM | POA: Diagnosis not present

## 2022-03-09 LAB — BASIC METABOLIC PANEL
Anion gap: 8 (ref 5–15)
BUN: 42 mg/dL — ABNORMAL HIGH (ref 6–20)
CO2: 22 mmol/L (ref 22–32)
Calcium: 7 mg/dL — ABNORMAL LOW (ref 8.9–10.3)
Chloride: 108 mmol/L (ref 98–111)
Creatinine, Ser: 1.73 mg/dL — ABNORMAL HIGH (ref 0.44–1.00)
GFR, Estimated: 35 mL/min — ABNORMAL LOW (ref >60)
Glucose, Bld: 95 mg/dL (ref 70–99)
Potassium: 4.8 mmol/L (ref 3.5–5.1)
Sodium: 138 mmol/L (ref 135–145)

## 2022-03-09 LAB — CBC
HCT: 29.1 % — ABNORMAL LOW (ref 36.0–46.0)
HCT: 32.3 % — ABNORMAL LOW (ref 36.0–46.0)
Hemoglobin: 8.5 g/dL — ABNORMAL LOW (ref 12.0–15.0)
Hemoglobin: 9.9 g/dL — ABNORMAL LOW (ref 12.0–15.0)
MCH: 27.4 pg (ref 26.0–34.0)
MCH: 28.3 pg (ref 26.0–34.0)
MCHC: 29.2 g/dL — ABNORMAL LOW (ref 30.0–36.0)
MCHC: 30.7 g/dL (ref 30.0–36.0)
MCV: 93.9 fL (ref 80.0–100.0)
MCV: 92.3 fL (ref 80.0–100.0)
Platelets: 208 10*3/uL (ref 150–400)
Platelets: 208 10*3/uL (ref 150–400)
RBC: 3.1 MIL/uL — ABNORMAL LOW (ref 3.87–5.11)
RBC: 3.5 MIL/uL — ABNORMAL LOW (ref 3.87–5.11)
RDW: 22.4 % — ABNORMAL HIGH (ref 11.5–15.5)
RDW: 21.6 % — ABNORMAL HIGH (ref 11.5–15.5)
WBC: 9.1 10*3/uL (ref 4.0–10.5)
WBC: 10 10*3/uL (ref 4.0–10.5)
nRBC: 0 % (ref 0.0–0.2)
nRBC: 0.2 % (ref 0.0–0.2)

## 2022-03-09 LAB — BLOOD GAS, ARTERIAL
Acid-base deficit: 6.4 mmol/L — ABNORMAL HIGH (ref 0.0–2.0)
Bicarbonate: 19.1 mmol/L — ABNORMAL LOW (ref 20.0–28.0)
Drawn by: 21310
O2 Saturation: 93.8 %
Patient temperature: 37.1
pCO2 arterial: 37 mmHg (ref 32–48)
pH, Arterial: 7.32 — ABNORMAL LOW (ref 7.35–7.45)
pO2, Arterial: 69 mmHg — ABNORMAL LOW (ref 83–108)

## 2022-03-09 LAB — GLUCOSE, CAPILLARY
Glucose-Capillary: 125 mg/dL — ABNORMAL HIGH (ref 70–99)
Glucose-Capillary: 153 mg/dL — ABNORMAL HIGH (ref 70–99)
Glucose-Capillary: 150 mg/dL — ABNORMAL HIGH (ref 70–99)
Glucose-Capillary: 153 mg/dL — ABNORMAL HIGH (ref 70–99)

## 2022-03-09 LAB — COMPREHENSIVE METABOLIC PANEL
ALT: 15 U/L (ref 0–44)
AST: 21 U/L (ref 15–41)
Albumin: 2.8 g/dL — ABNORMAL LOW (ref 3.5–5.0)
Alkaline Phosphatase: 70 U/L (ref 38–126)
Anion gap: 12 (ref 5–15)
BUN: 12 mg/dL (ref 6–20)
CO2: 18 mmol/L — ABNORMAL LOW (ref 22–32)
Calcium: 8.5 mg/dL — ABNORMAL LOW (ref 8.9–10.3)
Chloride: 110 mmol/L (ref 98–111)
Creatinine, Ser: 0.79 mg/dL (ref 0.44–1.00)
GFR, Estimated: >60 mL/min (ref >60)
Glucose, Bld: 138 mg/dL — ABNORMAL HIGH (ref 70–99)
Potassium: 3.6 mmol/L (ref 3.5–5.1)
Sodium: 140 mmol/L (ref 135–145)
Total Bilirubin: <0.1 mg/dL — ABNORMAL LOW (ref 0.3–1.2)
Total Protein: 6.2 g/dL — ABNORMAL LOW (ref 6.5–8.1)

## 2022-03-09 LAB — TROPONIN I (HIGH SENSITIVITY)
Troponin I (High Sensitivity): 19 ng/L — ABNORMAL HIGH (ref <18)
Troponin I (High Sensitivity): 17 ng/L (ref <18)

## 2022-03-09 LAB — BRAIN NATRIURETIC PEPTIDE: B Natriuretic Peptide: 652 pg/mL — ABNORMAL HIGH (ref 0.0–100.0)

## 2022-03-09 MED ORDER — SODIUM CHLORIDE 0.9 % IV SOLN
2.0000 g | INTRAVENOUS | Status: DC
  Administered 2022-03-09 – 2022-03-12 (×4): 2 g via INTRAVENOUS
  Filled 2022-03-09 (×4): qty 20

## 2022-03-09 MED ORDER — IOHEXOL 350 MG/ML SOLN
100.0000 mL | Freq: Once | INTRAVENOUS | Status: AC | PRN
  Administered 2022-03-09: 75 mL via INTRAVENOUS

## 2022-03-09 MED ORDER — FUROSEMIDE 10 MG/ML IJ SOLN
20.0000 mg | Freq: Once | INTRAMUSCULAR | Status: AC
Start: 2022-03-09 — End: 2022-03-09
  Administered 2022-03-09: 20 mg via INTRAVENOUS
  Filled 2022-03-09: qty 2

## 2022-03-09 MED ORDER — IPRATROPIUM-ALBUTEROL 0.5-2.5 (3) MG/3ML IN SOLN
3.0000 mL | Freq: Four times a day (QID) | RESPIRATORY_TRACT | Status: AC
  Administered 2022-03-09 – 2022-03-11 (×8): 3 mL via RESPIRATORY_TRACT
  Filled 2022-03-09 (×8): qty 3

## 2022-03-09 NOTE — Progress Notes (Signed)
Patient complaining of worsening upper abdominal pain and chest pain. EKG done. Pain subsiding after pain medication. Walk test with oxygen done and patient desaturated to 77%. Oxygen back up to 92% on 3L. MD notified.

## 2022-03-09 NOTE — TOC Initial Note (Signed)
Transition of Care [facility]) - Initial/Assessment Note    Patient Details  Name: [name] MRN: [id number] Date of Birth: 1970-12-10  Transition of Care [facility]) CM/SW Contact:    Iona Beard, [place] Phone Number: 03/09/2022, 2:37 PM  Clinical Narrative:                 Pt is high risk for readmission. CSW spoke to pt to complete assessment. Pt states that she lives alone but her mother and father live close by. Pt states that prior to breaking her back she was independent in completing her ADLs. Pts parents are able to assist in completing her ADLs and provide needed transportation. Pt has not had HH but is interested in this at D/C. Pt uses a walker when ambulating in the home. Pt states that she had [employer] for home O2 in the past and she would be interested in using them again if needed. TOC to follow for needs.   Expected Discharge Plan: [place] Barriers to Discharge: Continued Medical Work up   Patient Goals and CMS Choice Patient states their goals for this hospitalization and ongoing recovery are:: home with [facility] and DME CMS Medicare.gov Compare Post Acute Care list provided to:: Patient Choice offered to / list presented to : Patient  Expected Discharge Plan and Services Expected Discharge Plan: [place] In-house Referral: Clinical Social Work Discharge Planning Services: CM Consult Post Acute Care Choice: Home Health, Durable Medical Equipment Living arrangements for the past 2 months: Single Family Home                                      Prior Living Arrangements/Services Living arrangements for the past 2 months: Single Family Home Lives with:: Self Patient language and need for interpreter reviewed:: Yes Do you feel safe going back to the place where you live?: Yes      Need for Family Participation in Patient Care: Yes (Comment) Care giver support system in place?: Yes (comment) Current home services: DME  (walker) Criminal Activity/Legal Involvement Pertinent to Current Situation/Hospitalization: No - Comment as needed  Activities of Daily Living Home Assistive Devices/Equipment: None ADL Screening (condition at time of admission) Patient's cognitive ability adequate to safely complete daily activities?: Yes Is the patient deaf or have difficulty hearing?: No Does the patient have difficulty seeing, even when wearing glasses/contacts?: No Does the patient have difficulty concentrating, remembering, or making decisions?: No Patient able to express need for assistance with ADLs?: Yes Does the patient have difficulty dressing or bathing?: No Independently performs ADLs?: Yes (appropriate for developmental age) Does the patient have difficulty walking or climbing stairs?: No Weakness of Legs: None Weakness of Arms/Hands: None  Permission Sought/Granted                  Emotional Assessment Appearance:: Appears stated age Attitude/Demeanor/Rapport: Engaged Affect (typically observed): Accepting Orientation: : Oriented to Self, Oriented to Place, Oriented to  Time, Oriented to Situation Alcohol / Substance Use: Not Applicable Psych Involvement: No (comment)  Admission diagnosis:  Generalized abdominal pain [R10.84] Sepsis ([place]) [A41.9] Hypotension, unspecified hypotension type [I95.9] Multifocal pneumonia [J18.9] Sepsis with acute renal failure and septic shock, due to unspecified organism, unspecified acute renal failure type ([place]) [A41.9, R65.21, N17.9] Patient Active Problem List   Diagnosis Date Noted   Sepsis ([place]) 03/07/2022   Coffee ground emesis  Multifocal pneumonia 03/06/2022   Hyperkalemia 03/06/2022   SBO (small bowel obstruction) (HCC) 70/26/3785   Metabolic acidosis 88/50/2774   AKI (acute kidney injury) ([place]) 03/06/2022   Iron deficiency anemia due to chronic blood loss 01/29/2022   Severe sepsis ([place]) 08/24/2021   Lobar pneumonia ([place]) 08/24/2021   Acute  on chronic respiratory failure with hypoxia ([place]) 08/24/2021   Thoracic degenerative disc disease 12/06/2019   Hypothyroid 05/31/2019   Arteriovenous malformation (AVM)    Symptomatic anemia    Vitamin B12 deficiency 05/25/2019   Melena    History of pancreatitis 05/06/2019   Septic shock ([place]) 05/06/2019   Radicular pain 01/17/2019   Diabetic neuropathy (HCC) 10/29/2017   Elevated white blood cell count, unspecified 01/23/2015   Eosinophilia 01/23/2015   Iron deficiency 01/23/2015   Taking multiple medications for chronic disease 01/23/2015   DM type 2 (diabetes mellitus, type 2) ([place]) 03/28/2014   COPD exacerbation ([place]) 03/27/2014   CAP (community acquired pneumonia) 06/29/2013   Acute bronchitis 06/29/2013   Anxiety 02/27/2013   Abdominal cramping 02/27/2013   BRBPR (bright red blood per rectum) 12/02/2012   Constipation/Stool Impaction 12/02/2012   ABLA (acute blood loss anemia) 12/02/2012   Pancreatitis 11/24/2012   Hyperglycemia, drug-induced 06/08/2012   Arm paresthesia, left 06/07/2012   Neck pain on left side 06/07/2012   Asthma exacerbation 04/05/2012   Acute otitis media 02/04/2012   Hyperlipidemia 02/03/2012   Acute respiratory failure with hypoxia ([place]) 09/23/2011   Tobacco abuse 09/23/2011   Constipation 08/29/2011   Hypokalemia 07/15/2011   Muscle spasm 04/24/2011   Otitis externa 04/24/2011   Idiopathic acute pancreatitis 04/24/2011   Malodorous urine 10/13/2010   Suprapubic pain 10/13/2010   Anxiety and depression 10/12/2010   Back pain, thoracic 10/12/2010   Persistent posttraumatic perforation of ear drum 10/12/2010   Mycoplasma infection in conditions classified elsewhere and of unspecified site 02/19/2010   Dysuria 02/16/2010   Asthma, moderate persistent 08/11/2009   Closed fracture of neck of radius 01/08/2009   ANEMIA, IRON DEFICIENCY 01/04/2009   ANXIETY 01/04/2009   Bipolar disorder ([place]) 01/04/2009   MIGRAINE HEADACHE 01/04/2009    Essential hypertension 01/04/2009   ASTHMA 01/04/2009   ESOPHAGEAL STRICTURE 01/04/2009   GERD 01/04/2009   HIATAL HERNIA 01/04/2009   HEMATOCHEZIA 01/04/2009   Diarrhea 01/04/2009   Generalized abdominal pain 01/04/2009   Hiatal hernia 01/04/2009   PCP:  Redmond School, MD Pharmacy:   [place] [place] [address] [place] [address] Phone: [phone] Fax: [phone]     Social Determinants of Health (SDOH) Interventions    Readmission Risk Interventions    03/09/2022    2:36 PM  Readmission Risk Prevention Plan  Transportation Screening Complete  HRI or Home Care Consult Complete  Social Work Consult for [place] Planning/Counseling Complete  Palliative Care Screening Not Applicable  Medication Review Press photographer) Complete

## 2022-03-09 NOTE — Progress Notes (Signed)
eLink Physician-Brief Progress Note Patient Name: [name] DOB: 05-23-1971 MRN: [id number]   Date of Service  03/09/2022  HPI/Events of Note  Patient with increasing oxygen requirements, she is not compliant with her oxygen mask, she is currently hemodynamically stable off pressors (SBP 140's), BNP > 600, patient is not on any pressors.  eICU Interventions  Lasix 20 mg iv x 1, encourage patient to keep oxygen mask on, consider CPAP / BIPAP  if hypoxemia worsens, pulmonary consultation first thing tomorrow morning.        Kerry Kass Genise Strack 03/09/2022, 10:40 PM

## 2022-03-09 NOTE — Progress Notes (Signed)
Gastroenterology Progress Note   Referring Provider: No ref. provider found Primary Care Physician:  Redmond School, MD Primary Gastroenterologist:  Dr.  Patient ID: [name]; [id number]; [birth year]    Subjective   Last BM 2 days ago. Laxative yesterday. Abdominal pain at umbilicus radiating up to chest this morning but now resolved. Ate just small amount of breakfast: 2-3 bites of oatmeal due to pain. Denies flatus. Mild nausea but improved from admission. Pain was prior to breakfast. No overt GI bleeding.    Objective   Vital signs in last 24 hours Temp:  [97.9 F (36.6 C)-98.9 F (37.2 C)] 97.9 F (36.6 C) (08/14 0442) Pulse Rate:  [69-86] 71 (08/14 0752) Resp:  [12-20] 20 (08/14 0723) BP: (122-158)/(70-103) 158/79 (08/14 0752) SpO2:  [90 %-97 %] 94 % (08/14 0752) Last BM Date : 03/08/22  Physical Exam General:   Alert and oriented, pleasant Head:  Normocephalic and atraumatic. Abdomen:  Bowel sounds present, soft, non-tender, non-distended. No HSM or hernias noted. No rebound or guarding. No masses appreciated  Extremities:  Without edema. Neurologic:  Alert and  oriented x4   Intake/Output from previous day: 08/13 0701 - 08/14 0700 In: 768 [I.V.:318; IV Piggyback:450] Out: -  Intake/Output this shift: No intake/output data recorded.  Lab Results  Recent Labs    03/07/22 0435 03/08/22 0356 03/09/22 0515  WBC 9.6 8.6 9.1  HGB 8.7* 8.6* 8.5*  HCT 28.2* 28.0* 29.1*  PLT 185 193 208   BMET Recent Labs    03/06/22 2139 03/07/22 0435 03/08/22 0356  NA 138 139 137  K 4.8 4.8 4.5  CL 108 112* 112*  CO2 '22 22 22  '$ GLUCOSE 95 133* 156*  BUN 42* 34* 16  CREATININE 1.73* 1.11* 0.60  CALCIUM 7.0* 7.5* 8.2*   LFT Recent Labs    03/06/22 1617  PROT 7.4  ALBUMIN 3.6  AST 41  ALT 27  ALKPHOS 97  BILITOT 0.5  BILIDIR 0.1  IBILI 0.4   PT/INR Recent Labs    03/06/22 1617  LABPROT 11.4  INR 0.8    Studies/Results DG ABD ACUTE 2+V W  1V CHEST  Result Date: 03/09/2022 CLINICAL DATA:  Abdominal pain, shortness of breath EXAM: DG ABDOMEN ACUTE WITH 1 VIEW CHEST COMPARISON:  None Available. FINDINGS: Bowel gas pattern is unremarkable. No air-fluid levels or free air. Cholecystectomy clips. Interstitial changes with patchy opacities. Normal heart size. No pleural effusion or pneumothorax. IMPRESSION: Normal bowel gas pattern. Interstitial changes with patchy opacities. Similar findings are present in January. May reflect recurrent pneumonia superimposed on chronic disease. Electronically Signed   By: Macy Mis M.D.   On: 03/09/2022 08:20   CT ABDOMEN PELVIS WO CONTRAST  Result Date: 03/06/2022 CLINICAL DATA:  Abdominal pain, coffee-ground emesis EXAM: CT ABDOMEN AND PELVIS WITHOUT CONTRAST TECHNIQUE: Multidetector CT imaging of the abdomen and pelvis was performed following the standard protocol without IV contrast. RADIATION DOSE REDUCTION: This exam was performed according to the departmental dose-optimization program which includes automated exposure control, adjustment of the mA and/or kV according to patient size and/or use of iterative reconstruction technique. COMPARISON:  02/16/2022 FINDINGS: Lower chest: Patchy multifocal airspace opacities within the lung bases, right worse than left. Normal heart size. Hepatobiliary: No focal liver abnormality is seen. Status post cholecystectomy. No biliary dilatation. Pancreas: Pancreas is atrophic.  No inflammatory changes. Spleen: Normal in size without focal abnormality. Adrenals/Urinary Tract: Adrenal glands are unremarkable. Kidneys are normal, without renal calculi, focal lesion, or  hydronephrosis. Bladder is unremarkable. Stomach/Bowel: Moderate-sized hiatal hernia. Chronically thickened gastric folds within the portion of the stomach in the hernia sac. Borderline dilated loops of small bowel within the lower abdomen with air-fluid levels. There is some fecalization of small bowel  content. No abrupt transition point is seen. There is a large volume of stool throughout the colon. Normal appendix in the right lower quadrant. No pericolonic inflammatory changes. Vascular/Lymphatic: Scattered aortoiliac atherosclerotic calcifications without aneurysm. No abdominopelvic lymphadenopathy. Reproductive: Status post hysterectomy. No adnexal masses. Other: No free fluid. No abdominopelvic fluid collection. No pneumoperitoneum. No abdominal wall hernia. Musculoskeletal: Healing changes are noted at the L1 superior endplate compression fracture without progressive height loss. Chronic T12 compression fracture. No new or acute bony findings. IMPRESSION: 1. Borderline dilated loops of small bowel within the lower abdomen with air-fluid levels and some fecalization of small bowel content. No abrupt transition point is seen. Findings are suggestive of an ileus or enteritis versus developing small bowel obstruction. 2. Large volume of stool throughout the colon suggesting constipation. 3. Patchy multifocal airspace opacities within the lung bases, right worse than left, most compatible with multifocal pneumonia. 4. Moderate-sized hiatal hernia with chronically thickened gastric folds. 5. Healing changes at the L1 superior endplate compression fracture without progressive height loss. Chronic T12 compression fracture. 6. Aortic Atherosclerosis (ICD10-I70.0). Electronically Signed   By: Davina Poke D.O.   On: 03/06/2022 17:55   CT ABDOMEN PELVIS W CONTRAST  Result Date: 02/16/2022 CLINICAL DATA:  [place] a few days ago, with abdominal pain and lower back pain. EXAM: CT ABDOMEN AND PELVIS WITH CONTRAST TECHNIQUE: Multidetector CT imaging of the abdomen and pelvis was performed using the standard protocol following bolus administration of intravenous contrast. RADIATION DOSE REDUCTION: This exam was performed according to the departmental dose-optimization program which includes automated exposure control,  adjustment of the mA and/or kV according to patient size and/or use of iterative reconstruction technique. CONTRAST:  46m OMNIPAQUE IOHEXOL 300 MG/ML  SOLN COMPARISON:  CTs without contrast 05/30/2021 and 12/04/2019. FINDINGS: Lower chest: Lung bases show scattered linear scarring or atelectasis without acute process. The cardiac size is normal. Moderate-sized hiatal hernia contains the proximal half of the stomach intrathoracic. Hepatobiliary: No focal liver abnormality is seen. Status post cholecystectomy. No biliary dilatation. Pancreas: Atrophic and otherwise unremarkable, particularly in the tail. Spleen: No suspicious abnormality. No splenomegaly. There is a tiny hypodensity in the medial upper spleen which is probably a small cyst and appears to have been present previously. Adrenals/Urinary Tract: No adrenal or renal cortical mass enhancement is seen and no calculus or hydronephrosis. Unremarkable bladder for the degree of distention. Stomach/Bowel: Moderate-sized hiatal hernia. Thickened folds noted chronically in the stomach, today also seen the duodenum and jejunum. There is no small bowel obstruction or inflammation. The appendix is upper-normal in caliber at 6 mm but is similar to prior studies without inflammation. Moderate to severe stool retention is increased in the cecum, ascending and proximal transverse colon, with moderate fecal stasis in the descending segment. No dilatation or wall thickening. Vascular/Lymphatic: Aortic atherosclerosis. No enlarged abdominal or pelvic lymph nodes. There are multiple pelvic phleboliths. Reproductive: Status post hysterectomy. No adnexal masses. Other: No abdominal wall or inguinal hernia. No free air, hemorrhage, abscess or free fluid. Musculoskeletal: There is evidence of a recent upper plate anterior wedge compression fracture of the L1 vertebral body. There is approximally 30% anterior and 10% posterior vertebral height loss with trace posterosuperior  cortical retropulsion and slight nondisplaced fragmentation along  the anterior superior vertebral body. There is osteopenia with additional note of mild paraspinal soft tissue thickening at L1, but no space-occupying paraspinal hematoma or spinal canal hematoma is seen. There is a chronic, unchanged moderate anterior wedge compression fracture of the T12 vertebral body and mild chronic wedging of T10 and T11 as well, also unchanged. Additionally noted likely acute nondisplaced fractures of the posteromedial left twelfth rib and the left L1 transverse process. There is subcutaneous stranding laterally in the right hip region but no pelvic or proximal femoral fractures IMPRESSION: 1. Recent upper plate anterior wedge compression fracture L1, with 30% anterior and 10% posterior height loss and trace retropulsion. 2. Recent, likely acute nondisplaced fractures of the posteromedial left twelfth rib and left L1 transverse process. 3. Old compression fractures of T10, T11 and T12. 4. Gastroenteritis without evidence of small-bowel obstruction or inflammation. Moderate-sized hiatal hernia. 5. Constipation, most significantly in the ascending and proximal transverse colon. 6. Upper normal appendiceal caliber but similar to prior studies with no inflammatory change. Electronically Signed   By: Telford Nab M.D.   On: 02/16/2022 20:20   CT L-SPINE NO CHARGE  Result Date: 02/16/2022 CLINICAL DATA:  Initial evaluation for back pain, recent fall. EXAM: CT LUMBAR SPINE WITHOUT CONTRAST TECHNIQUE: Multidetector CT imaging of the lumbar spine was performed without intravenous contrast administration. Multiplanar CT image reconstructions were also generated. RADIATION DOSE REDUCTION: This exam was performed according to the departmental dose-optimization program which includes automated exposure control, adjustment of the mA and/or kV according to patient size and/or use of iterative reconstruction technique. COMPARISON:  Prior  study from 07/29/2012. FINDINGS: Segmentation: Standard. Lowest well-formed disc space labeled the L5-S1 level. Alignment: Exaggeration of the normal lumbar lordosis. No listhesis. Vertebrae: Acute compression fracture involving the superior endplate of L1 is seen. Associated height loss measures up to 35% with trace 2 mm bony retropulsion. Associated acute minimally displaced fracture of the left transverse process of L1 noted. No other acute fracture within the lumbar spine. Chronic compression fracture of T12 with up to 40% height loss with 2 mm bony retropulsion. Additional subacute to chronic bilateral posterior twelfth rib fractures noted, with incomplete union on the left. Visualized sacrum and pelvis intact. No discrete osseous lesions. Paraspinal and other soft tissues: Paraspinous soft tissues demonstrate no acute finding. Disc levels: Mild noncompressive disc bulging noted within the lower lumbar spine at L3-4 through L5-S1. Mild-to-moderate lower lumbar facet hypertrophy. No significant spinal stenosis. Foramina remain patent. IMPRESSION: 1. Acute compression fracture involving the superior endplate of L1 with up to 35% height loss and trace 2 mm bony retropulsion. 2. Associated acute minimally displaced fracture of the left transverse process of L1. 3. Chronic compression fracture of T12 with up to 40% height loss with 2 mm bony retropulsion. 4. Additional subacute to chronic bilateral posterior twelfth rib fractures, with incomplete union on the left. Electronically Signed   By: Jeannine Boga M.D.   On: 02/16/2022 20:08    Assessment  51 y.o. female with a history of chronic pancreatitis, large hiatal hernia, diabetes, COPD, asthma, presenting this admission with epigastric pain, shortness of breath, and hematemesis. Admitted with sepsis due to aspiration pneumonia, acute on chronic anemia, ileus.  Acute blood loss anemia: due to suspected UGI bleed. In light of sepsis, following  conservatively for now. Prior evaluation for obscure GI bleeding in the past. (Colonoscopy Nov 2020, push enteroscopy 2020, capsule 2020 with active bleeding in distal duodenum/proximal jejunum, EUS March 2023 s/p celiac plexus  block and normal EGD except moderate hiatal hernia). Hgb remaining stable for past 24-48 hours. No overt GI bleeding. Will need to follow-up with primary GI as outpatient.   Ileus: resolved. Continue Miralax BID, suppository prn.     Plan / Recommendations  PPI BID Miralax BID Follow-up with primary GI as outpatient Will sign off for now. Please reach out if worsening anemia or overt GI bleeding.     LOS: 3 days    03/09/2022, 9:07 AM  Annitta Needs, PhD, [facility] Gastroenterology

## 2022-03-09 NOTE — Progress Notes (Signed)
Patients daughter in law, Catilin, called for patient update. Patient gave consent to give her daughter in law an update on her status.

## 2022-03-09 NOTE — Progress Notes (Signed)
PROGRESS NOTE     [name], is a 51 y.o. female, DOB - 10-Sep-1970, WUJ:[id number]  Admit date - 03/06/2022   Admitting Physician Truett Mainland, DO  Outpatient Primary MD for the patient is Redmond School, MD  LOS - 3  Chief Complaint  Patient presents with   Back Pain      Brief Narrative:  51 y.o. female with medical history significant of type 2 diabetes, hypothyroidism, COPD/asthma, hyperlipidemia, chronic pancreatitis admitted on 03/06/2022 with concerns for acute GI bleed and possible ileus versus partial small bowel obstruction as well as sepsis from pneumonia probably aspiration related    -Assessment and Plan:  1)Sepsis with Septic shock-due to aspiration pneumonia/CAP--POA -Patient met sepsis shock criteria on admission -Weaned off IV Levophed completely -Leukocytosis resolved -PCT 8.58 -Urine and blood cultures NGTD Continue  azithromycin -Switch cefepime to Rocephin -Stopped IV vancomycin if MRSA PCR is negative  2)Acute acute hypoxic respiratory failure---  --Attempted to wean off oxygen O2 sats dropped down to 77%--- patient became dyspneic and had some chest discomfort -EKG without acute findings -Patient placed back on oxygen --Repeat x-rays on 03/09/2022 consistent with bilateral pneumonia  3)Acute on chronic Anemia--- heme positive stool and hematemesis -Events work-up was consistent with iron and B12 deficiency Hgb 11.8 >>8.7>>8.6 -GI consult appreciated -Continue IV Protonix -Possible EGD when respiratory status improved -Patient is due for her B12 shot this will be given today -Patient gets outpatient iron infusion she can continue this postdischarge  4)Ileus Versus partial SBO----General surgery consult appreciated --multiple BMs with miralax, lactulose and Dulcolax suppository -Clinically patient appears to have resolved ileus -General surgery input appreciated  5)AKI----acute kidney injury with hyperkalemia and metabolic acidosis -   creatinine on admission= 2.77 , - baseline creatinine = 0.7 to 0.9   ,  -creatinine has now normalized,  --renally adjust medications, avoid nephrotoxic agents / dehydration  / hypotension  6)DM2--- A1c 5.9 reflecting excellent diabetic control PTA -Hold metformin Use Novolog/Humalog Sliding scale insulin with Accu-Cheks/Fingersticks as ordered   6)HTN--patient with dehydration and septic shock -Continue to hold antihypertensives -Patient has been weaned off Levophed completely  8) hypomagnesemia- --- in the setting of vomiting and GI losses -Replaced  9) urinary retention and possible UTI -Discontinued Foley catheter that was placed in the ED -c/n Flomax   -Patient is voiding okay -Rocephin pending urine culture data  10) anxiety and depression--- stable, continue Zoloft, buspirone  11)Hypothyroidism--- TSH WNL at 1.97 -Continue levothyroxine  12) subacute back pain--- patient has had back pain for the last couple weeks  -be judicious with opiates due to concerns about ileus/SBO -She was scheduled for outpatient work-up by her neurosurgeon apparently in [place] -Follow-up with neurosurgeon/orthopedic surgeon as outpatient for MRI and further evaluation  Disposition/Need for in-Hospital Stay- patient unable to be discharged at this time due to *sepsis requiring IV fluids and IV antibiotics -Dyspnea and hypoxia persist  Status is: Inpatient   Disposition: The patient is from: Home              Anticipated d/c is to: Home              Anticipated d/c date is: 1 day              Patient currently is not medically stable to d/c. Barriers: Not Clinically Stable-   Code Status : -  Code Status: Full Code   Family Communication:   NA (patient is alert, awake and coherent)   DVT Prophylaxis  :   -  SCDs   Place and maintain sequential compression device Start: 03/07/22 0441   Lab Results  Component Value Date   PLT 208 03/09/2022   Inpatient Medications  Scheduled  Meds:  amitriptyline  100 mg Oral QHS   atorvastatin  20 mg Oral Daily   bisacodyl  10 mg Rectal QHS   busPIRone  10 mg Oral TID   Chlorhexidine Gluconate Cloth  6 each Topical Daily   citalopram  40 mg Oral Daily   fluticasone furoate-vilanterol  1 puff Inhalation Daily   gabapentin  300 mg Oral TID   insulin aspart  0-5 Units Subcutaneous QHS   insulin aspart  0-9 Units Subcutaneous TID WC   ipratropium-albuterol  3 mL Nebulization QID   levothyroxine  25 mcg Oral Q0600   methocarbamol  500 mg Oral TID   methylPREDNISolone (SOLU-MEDROL) injection  40 mg Intravenous Q12H   pantoprazole (PROTONIX) IV  40 mg Intravenous Q12H   pneumococcal 20-valent conjugate vaccine  0.5 mL Intramuscular Tomorrow-1000   polyethylene glycol  17 g Oral BID   tamsulosin  0.4 mg Oral Daily   umeclidinium bromide  1 puff Inhalation Daily   Continuous Infusions:  sodium chloride 10 mL/hr at 03/09/22 1122   azithromycin Stopped (03/09/22 0419)   cefTRIAXone (ROCEPHIN)  IV     PRN Meds:.albuterol, fentaNYL (SUBLIMAZE) injection, ondansetron **OR** ondansetron (ZOFRAN) IV, oxyCODONE   Anti-infectives (From admission, onward)    Start     Dose/Rate Route Frequency Ordered Stop   03/09/22 1900  cefTRIAXone (ROCEPHIN) 2 g in sodium chloride 0.9 % 100 mL IVPB        2 g 200 mL/hr over 30 Minutes Intravenous Every 24 hours 03/09/22 1808     03/08/22 1600  vancomycin (VANCOREADY) IVPB 750 mg/150 mL  Status:  Discontinued        750 mg 150 mL/hr over 60 Minutes Intravenous Every 48 hours 03/06/22 1602 03/08/22 0812   03/08/22 1400  ceFEPIme (MAXIPIME) 2 g in sodium chloride 0.9 % 100 mL IVPB  Status:  Discontinued        2 g 200 mL/hr over 30 Minutes Intravenous Every 8 hours 03/08/22 0808 03/09/22 1808   03/07/22 0440  azithromycin (ZITHROMAX) 500 mg in sodium chloride 0.9 % 250 mL IVPB        500 mg 250 mL/hr over 60 Minutes Intravenous Every 24 hours 03/07/22 0440     03/06/22 1615  ceFEPIme (MAXIPIME)  2 g in sodium chloride 0.9 % 100 mL IVPB  Status:  Discontinued        2 g 200 mL/hr over 30 Minutes Intravenous Every 24 hours 03/06/22 1600 03/08/22 0808   03/06/22 1615  vancomycin (VANCOCIN) IVPB 1000 mg/200 mL premix        1,000 mg 200 mL/hr over 60 Minutes Intravenous  Once 03/06/22 1601 03/06/22 1906       Subjective: [name] today has no fevers, no emesis,   - -Intake is fair,  had BM -Patient has cough and dyspnea -Attempted to wean off oxygen O2 sats dropped down to 77%--- patient became dyspneic and had some chest discomfort -EKG without acute findings -Patient placed back on oxygen  Objective: Vitals:   03/09/22 0752 03/09/22 1058 03/09/22 1513 03/09/22 1517  BP: (!) 158/79  (!) 157/82   Pulse: 71 72 86 88  Resp:  _0 Temp:   98.3 F (36.8 C)   TempSrc:   Oral   SpO2: 94%  95% 90% 94%  Weight:      Height:        Intake/Output Summary (Last 24 hours) at 03/09/2022 1811 Last data filed at 03/09/2022 1810 Gross per 24 hour  Intake 1251.94 ml  Output --  Net 1251.94 ml   Filed Weights   03/06/22 1336 03/07/22 1352 03/08/22 0521  Weight: 53.5 kg 55.5 kg 58 kg   Physical Exam  Gen:- Awake Alert,  in no apparent distress  HEENT:- [place].AT, No sclera icterus Neck-Supple Neck,No JVD,.  Lungs-improved air movement, no wheezing  CV- S1, S2 normal, regular  Abd-  +ve B.Sounds, Abd Soft, No tenderness,    Extremity/Skin:- No  edema, pedal pulses present  Psych-affect is appropriate, oriented x3 Neuro-no new focal deficits, no tremors  Data Reviewed: I have personally reviewed following labs and imaging studies  CBC: Recent Labs  Lab 03/06/22 1432 03/06/22 1754 03/07/22 0435 03/08/22 0356 03/09/22 0515  WBC 14.2*  --  9.6 8.6 9.1  NEUTROABS 11.6*  --   --   --   --   HGB 11.8* 10.0* 8.7* 8.6* 8.5*  HCT 37.8 34.7* 28.2* 28.0* 29.1*  MCV 90.6  --  91.6 92.4 93.9  PLT 322  --  185 193 680   Basic Metabolic Panel: Recent Labs  Lab  03/06/22 1432 03/06/22 2139 03/07/22 0435 03/08/22 0356  NA 133* 138 139 137  K 5.9* 4.8 4.8 4.5  CL 96* 108 112* 112*  CO2 18* _0 GLUCOSE 131* 95 133* 156*  BUN 51* 42* 34* 16  CREATININE 2.77* 1.73* 1.11* 0.60  CALCIUM 7.8* 7.0* 7.5* 8.2*  MG  --   --  1.4*  --   PHOS  --   --  3.5  --    GFR: Estimated Creatinine Clearance: 64.3 mL/min (by C-G formula based on SCr of 0.6 mg/dL). Liver Function Tests: Recent Labs  Lab 03/06/22 1617  AST 41  ALT 27  ALKPHOS 97  BILITOT 0.5  PROT 7.4  ALBUMIN 3.6   HbA1C: Recent Labs    03/07/22 0435  HGBA1C 5.9*   ) Recent Results (from the past 240 hour(s))  Culture, blood (Routine x 2)     Status: None (Preliminary result)   Collection Time: 03/06/22  2:53 PM   Specimen: BLOOD RIGHT ARM  Result Value Ref Range Status   Specimen Description   Final    BLOOD RIGHT ARM BOTTLES DRAWN AEROBIC AND ANAEROBIC   Special Requests Blood Culture adequate volume  Final   Culture   Final    NO GROWTH 3 DAYS Performed at [facility], [address] [place], [place] [address]    Report Status PENDING  Incomplete  Culture, blood (Routine x 2)     Status: None (Preliminary result)   Collection Time: 03/06/22  4:35 PM   Specimen: Left Antecubital; Blood  Result Value Ref Range Status   Specimen Description   Final    LEFT ANTECUBITAL BOTTLES DRAWN AEROBIC AND ANAEROBIC Performed at [facility], [address], [place], [place] [address]    Special Requests   Final    Blood Culture adequate volume Performed at [facility], [address], [place], [place] [address]    Culture  Setup Time   Final    GRAM POSITIVE RODS AEROBIC BOTTLE ONLY Gram Stain Report Called to,Read Back By and Verified With: Morton Peters 2150 03/07/22 BY GMCGEEHON WORK DONE AT [facility] WITH RESULT  Culture   Final    CULTURE REINCUBATED FOR BETTER GROWTH IDENTIFICATION TO FOLLOW Performed at [facility], [place] [address], [place], [place] [address]    Report Status PENDING  Incomplete  Urine Culture     Status: None   Collection Time: 03/06/22  6:38 PM   Specimen: In/Out Cath Urine  Result Value Ref Range Status   Specimen Description   Final    IN/OUT CATH URINE Performed at [facility], [address], [place], [place] [address]    Special Requests   Final    NONE Performed at [facility], [address] [place], [place] [address]    Culture   Final    NO GROWTH Performed at [facility], [place] [address], [place], [place] [address]    Report Status 03/08/2022 FINAL  Final  Resp Panel by RT-PCR (Flu A&B, Covid) Anterior Nasal Swab     Status: None   Collection Time: 03/06/22  9:30 PM   Specimen: Anterior Nasal Swab  Result Value Ref Range Status   SARS Coronavirus 2 by RT PCR NEGATIVE NEGATIVE Final    Comment: (NOTE) SARS-CoV-2 target nucleic acids are NOT DETECTED.  The SARS-CoV-2 RNA is generally detectable in upper respiratory specimens during the acute phase of infection. The lowest concentration of SARS-CoV-2 viral copies this assay can detect is 138 copies/mL. A negative result does not preclude SARS-Cov-2 infection and should not be used as the sole basis for treatment or other patient management decisions. A negative result may occur with  improper specimen collection/handling, submission of specimen other than nasopharyngeal swab, presence of viral mutation(s) within the areas targeted by this assay, and inadequate number of viral copies(<138 copies/mL). A negative result must be combined with clinical observations, patient history, and epidemiological information. The expected result is Negative.  Fact Sheet for Patients:  EntrepreneurPulse.com.au  Fact Sheet for Healthcare Providers:  IncredibleEmployment.be  This test is no t yet approved or cleared by the [place] FDA and  has been authorized for detection and/or diagnosis of  SARS-CoV-2 by FDA under an Emergency Use Authorization (EUA). This EUA will remain  in effect (meaning this test can be used) for the duration of the COVID-19 declaration under Section 564(b)(1) of the Act, 21 U.S.C.section 360bbb-3(b)(1), unless the authorization is terminated  or revoked sooner.       Influenza A by PCR NEGATIVE NEGATIVE Final   Influenza B by PCR NEGATIVE NEGATIVE Final    Comment: (NOTE) The Xpert Xpress SARS-CoV-2/FLU/RSV plus assay is intended as an aid in the diagnosis of influenza from Nasopharyngeal swab specimens and should not be used as a sole basis for treatment. Nasal washings and aspirates are unacceptable for Xpert Xpress SARS-CoV-2/FLU/RSV testing.  Fact Sheet for Patients: EntrepreneurPulse.com.au  Fact Sheet for Healthcare Providers: IncredibleEmployment.be  This test is not yet approved or cleared by the [place] FDA and has been authorized for detection and/or diagnosis of SARS-CoV-2 by FDA under an Emergency Use Authorization (EUA). This EUA will remain in effect (meaning this test can be used) for the duration of the COVID-19 declaration under Section 564(b)(1) of the Act, 21 U.S.C. section 360bbb-3(b)(1), unless the authorization is terminated or revoked.  Performed at [facility], [address], [place], [place] [address]   MRSA Next Gen by PCR, Nasal     Status: None   Collection Time: 03/07/22  3:42 PM   Specimen: Nasal Mucosa; Nasal Swab  Result Value Ref Range  Status   MRSA by PCR Next Gen NOT DETECTED NOT DETECTED Final    Comment: (NOTE) The GeneXpert MRSA Assay (FDA approved for NASAL specimens only), is one component of a comprehensive MRSA colonization surveillance program. It is not intended to diagnose MRSA infection nor to guide or monitor treatment for MRSA infections. Test performance is not FDA approved in patients less than 75 years old. Performed at [facility], [address], [place], [place] [address]     Radiology Studies: DG ABD ACUTE 2+V W 1V CHEST  Result Date: 03/09/2022 CLINICAL DATA:  Abdominal pain, shortness of breath EXAM: DG ABDOMEN ACUTE WITH 1 VIEW CHEST COMPARISON:  None Available. FINDINGS: Bowel gas pattern is unremarkable. No air-fluid levels or free air. Cholecystectomy clips. Interstitial changes with patchy opacities. Normal heart size. No pleural effusion or pneumothorax. IMPRESSION: Normal bowel gas pattern. Interstitial changes with patchy opacities. Similar findings are present in January. May reflect recurrent pneumonia superimposed on chronic disease. Electronically Signed   By: Macy Mis M.D.   On: 03/09/2022 08:20     Scheduled Meds:  amitriptyline  100 mg Oral QHS   atorvastatin  20 mg Oral Daily   bisacodyl  10 mg Rectal QHS   busPIRone  10 mg Oral TID   Chlorhexidine Gluconate Cloth  6 each Topical Daily   citalopram  40 mg Oral Daily   fluticasone furoate-vilanterol  1 puff Inhalation Daily   gabapentin  300 mg Oral TID   insulin aspart  0-5 Units Subcutaneous QHS   insulin aspart  0-9 Units Subcutaneous TID WC   ipratropium-albuterol  3 mL Nebulization QID   levothyroxine  25 mcg Oral Q0600   methocarbamol  500 mg Oral TID   methylPREDNISolone (SOLU-MEDROL) injection  40 mg Intravenous Q12H   pantoprazole (PROTONIX) IV  40 mg Intravenous Q12H   pneumococcal 20-valent conjugate vaccine  0.5 mL Intramuscular Tomorrow-1000   polyethylene glycol  17 g Oral BID   tamsulosin  0.4 mg Oral Daily   umeclidinium bromide  1 puff Inhalation Daily   Continuous Infusions:  sodium chloride 10 mL/hr at 03/09/22 1122   azithromycin Stopped (03/09/22 0419)   cefTRIAXone (ROCEPHIN)  IV      LOS: 3 days   Roxan Hockey M.D on 03/09/2022 at 6:11 PM  Go to [email or web address] - for contact info  [facility] - Office  [phone]  If 7PM-7AM, please contact night-coverage [email or web address] 03/09/2022, 6:11 PM

## 2022-03-09 NOTE — Progress Notes (Signed)
[facility] Night coverage note:  S: Called by RN, pt with increased SOB, increased O2 requirement (now satting 90% on 15L HFNC).  Pt also having new onset CP for past couple of hours.  L sided but pleuritic and comes and goes.  (Sharp L sided CP, worse with deep inspiration per patient).  Dont see that patient has been on any chemo ppx (nor SCDs) for DVT ppx that I can tell (presumably no chemo PPx due to concern for GIB at time of admission).  O:  Coarse BS bilaterally. Pt satting 90% mild tachycardia to 110 on 15L HFNC. Still talking full sentences Mild tachypnea but no overt respiratory distress at this point.  A: Pt with worsening acute hypoxic resp failure and pleuritic L sided CP. DDx includes PE, worsening PNA, ARDS. Cardiac possible but seems less likely.  P: Ordering stat:  CXR - official read pending, but looks like B multifocal PNA, maybe slightly improved compared to prior to me. ABG CTA chest EKG Trop BNP CBC CMP Transfer to SDU - doesn't look like shes going to require immediate intubation at this time.  CRITICAL CARE Performed by: Etta Quill.   Total critical care time: 60 minutes  Critical care time was exclusive of separately billable procedures and treating other patients.  Critical care was necessary to treat or prevent imminent or life-threatening deterioration.  Critical care was time spent personally by me on the following activities: development of treatment plan with patient and/or surrogate as well as nursing, discussions with consultants, evaluation of patient's response to treatment, examination of patient, obtaining history from patient or surrogate, ordering and performing treatments and interventions, ordering and review of laboratory studies, ordering and review of radiographic studies, pulse oximetry and re-evaluation of patient's condition.

## 2022-03-09 NOTE — Progress Notes (Addendum)
[facility] night coverage note: Update:  CTA chest = no PE, does have multifocal B airspace dz c/w multifocal PNA.  EKG = S. Tach with RBBB (RBBB present on all prior EKGs this admit).  Trop pending.  At this point, suspect increased O2 requirement possibly due to worsening PNA.  MRSA PCR neg on 8/12.  Dont really have indication to start vanc.  Will see about curb siding pulm given worsening O2 requirement, ? ARDS.  Spoke with Dr Lucile Shutters: BNP elevated at 381, BP 840 systolic. Giving '20mg'$  lasix IV x1.

## 2022-03-10 ENCOUNTER — Inpatient Hospital Stay (HOSPITAL_COMMUNITY): Payer: Medicare Other

## 2022-03-10 DIAGNOSIS — J189 Pneumonia, unspecified organism: Secondary | ICD-10-CM | POA: Diagnosis not present

## 2022-03-10 DIAGNOSIS — R6521 Severe sepsis with septic shock: Secondary | ICD-10-CM | POA: Diagnosis not present

## 2022-03-10 DIAGNOSIS — D62 Acute posthemorrhagic anemia: Secondary | ICD-10-CM | POA: Diagnosis not present

## 2022-03-10 DIAGNOSIS — A419 Sepsis, unspecified organism: Secondary | ICD-10-CM | POA: Diagnosis not present

## 2022-03-10 LAB — GLUCOSE, CAPILLARY
Glucose-Capillary: 170 mg/dL — ABNORMAL HIGH (ref 70–99)
Glucose-Capillary: 187 mg/dL — ABNORMAL HIGH (ref 70–99)
Glucose-Capillary: 153 mg/dL — ABNORMAL HIGH (ref 70–99)

## 2022-03-10 LAB — CULTURE, BLOOD (ROUTINE X 2): Special Requests: ADEQUATE

## 2022-03-10 LAB — MAGNESIUM: Magnesium: 1.1 mg/dL — ABNORMAL LOW (ref 1.7–2.4)

## 2022-03-10 MED ORDER — DM-GUAIFENESIN ER 30-600 MG PO TB12
1.0000 | ORAL_TABLET | Freq: Two times a day (BID) | ORAL | Status: DC
  Administered 2022-03-10 – 2022-03-13 (×7): 1 via ORAL
  Filled 2022-03-10 (×7): qty 1

## 2022-03-10 MED ORDER — FUROSEMIDE 10 MG/ML IJ SOLN
20.0000 mg | Freq: Two times a day (BID) | INTRAMUSCULAR | Status: DC
  Administered 2022-03-10 – 2022-03-11 (×2): 20 mg via INTRAVENOUS
  Filled 2022-03-10 (×2): qty 2

## 2022-03-10 MED ORDER — MAGNESIUM SULFATE 4 GM/100ML IV SOLN
4.0000 g | Freq: Once | INTRAVENOUS | Status: AC
  Administered 2022-03-10: 4 g via INTRAVENOUS
  Filled 2022-03-10: qty 100

## 2022-03-10 MED ORDER — FUROSEMIDE 10 MG/ML IJ SOLN
40.0000 mg | Freq: Once | INTRAMUSCULAR | Status: AC
Start: 2022-03-10 — End: 2022-03-10
  Administered 2022-03-10: 40 mg via INTRAVENOUS
  Filled 2022-03-10: qty 4

## 2022-03-10 NOTE — Consult Note (Signed)
NAME:  [name], MRN:  [id number], DOB:  October 30, 1970, LOS: 4 ADMISSION DATE:  03/06/2022, CONSULTATION DATE:  8/15  REFERRING MD:  Denton Brick, CHIEF COMPLAINT:  acute resp failure    History of Present Illness:  51 y.o. female smoker  with medical history significant of type 2 diabetes, hypothyroidism, COPD/asthma(no pfts in Epic), hyperlipidemia, chronic pancreatitis who presents with generalized body aches, upper abdominal pain.  The patient was recently seen in the emergency department approximately 2 weeks ago and was diagnosed with a L1 compression fracture and discharged home with a TLSO brace and pain medicine.  Her pain had been fairly well controlled until am of admit  when she started to have diffuse body aches, nausea, hematemesis.  Due to worsening symptoms, the patient came to the hospital for evaluation.  C/o  mild shortness of breath.  She had been having severe constipation and stated that she had not had a bowel movement since she had the compression fracture. Also c/o   having a hard time urinating.  Working dx is septic shock with marked elevation PCT but ? Source ? Asp pna vs chf vs ALI from septic source outside the lung.        Scheduled Meds:  amitriptyline  100 mg Oral QHS   atorvastatin  20 mg Oral Daily   bisacodyl  10 mg Rectal QHS   busPIRone  10 mg Oral TID   Chlorhexidine Gluconate Cloth  6 each Topical Daily   citalopram  40 mg Oral Daily   dextromethorphan-guaiFENesin  1 tablet Oral BID   fluticasone furoate-vilanterol  1 puff Inhalation Daily   furosemide  20 mg Intravenous Q12H   gabapentin  300 mg Oral TID   insulin aspart  0-5 Units Subcutaneous QHS   insulin aspart  0-9 Units Subcutaneous TID WC   ipratropium-albuterol  3 mL Nebulization QID   levothyroxine  25 mcg Oral Q0600   methocarbamol  500 mg Oral TID   methylPREDNISolone (SOLU-MEDROL) injection  40 mg Intravenous Q12H   pantoprazole (PROTONIX) IV  40 mg Intravenous Q12H   pneumococcal  20-valent conjugate vaccine  0.5 mL Intramuscular Tomorrow-1000   polyethylene glycol  17 g Oral BID   tamsulosin  0.4 mg Oral Daily   umeclidinium bromide  1 puff Inhalation Daily   Continuous Infusions:  sodium chloride 10 mL/hr at 03/09/22 1122   azithromycin 500 mg (03/10/22 0503)   cefTRIAXone (ROCEPHIN)  IV Stopped (03/09/22 2210)   PRN Meds:.albuterol, fentaNYL (SUBLIMAZE) injection, ondansetron **OR** ondansetron (ZOFRAN) IV, oxyCODONE    Significant Hospital Events: Including procedures, antibiotic start and stop dates in addition to other pertinent events   Echo  8/15 >>>   Interim History / Subjective:  Much better p lasix and placed on bipap  Objective   Blood pressure 126/83, pulse 94, temperature 98.4 F (36.9 C), temperature source Axillary, resp. rate 17, height '5\' 2"'$  (1.575 m), weight 58.6 kg, SpO2 99 %.        Intake/Output Summary (Last 24 hours) at 03/10/2022 1347 Last data filed at 03/10/2022 1200 Gross per 24 hour  Intake 360 ml  Output 3650 ml  Net -3290 ml   Filed Weights   03/07/22 1352 03/08/22 0521 03/10/22 0431  Weight: 55.5 kg 58 kg 58.6 kg    Examination:  Tmax:  99.2 General appearance:    acute and chronically ill appearing   At Rest 02 sats  100% on 0.70 fio2 bipap   No jvd Oropharynx  clear,  mucosa nl Neck supple Lungs with a few scattered exp > insp rhonchi bilaterally RRR no s3 or or sign murmur Abd soft with limited   excursion  Extr warm  with no edema or clubbing noted Neuro  Sensorium intact ,  no apparent motor deficits     I personally reviewed images and agree with radiology impression as follows:  CXR:   pa and lat 03/10/22 No significant change in examination with diffuse bilateral interstitial and heterogeneous airspace opacity superimposed upon emphysema. Findings are consistent with edema or infection. No new airspace opacity.    Assessment & Plan:  1) acute hypoxemic resp failure/hyperventilation in pt with  likely sepsis/ ALI though may have component of CHF as well  >>> keep as dry as tol/ bipap prn and wean 02 for sats  > 95%   2) Sepsis with elevated PCT ? Source ? As pna  vs CAP  >>> continue present abx pending final culture   3) Mild anemia s/p hematemsis which has resolved for now  >>> threshold to transfuse @ < 8 since also has severe hypoxemic RF   4) smoker with ? Underly copd/ no pfts available Rec continue bronchodiators       Labs   CBC: Recent Labs  Lab 03/06/22 1432 03/06/22 1754 03/07/22 0435 03/08/22 0356 03/09/22 0515 03/09/22 2113  WBC 14.2*  --  9.6 8.6 9.1 10.0  NEUTROABS 11.6*  --   --   --   --   --   HGB 11.8* 10.0* 8.7* 8.6* 8.5* 9.9*  HCT 37.8 34.7* 28.2* 28.0* 29.1* 32.3*  MCV 90.6  --  91.6 92.4 93.9 92.3  PLT 322  --  185 193 208 578    Basic Metabolic Panel: Recent Labs  Lab 03/06/22 1432 03/06/22 2139 03/07/22 0435 03/08/22 0356 03/09/22 2113 03/10/22 0358  NA 133* 138 139 137 140  --   K 5.9* 4.8 4.8 4.5 3.6  --   CL 96* 108 112* 112* 110  --   CO2 18* '22 22 22 '$ 18*  --   GLUCOSE 131* 95 133* 156* 138*  --   BUN 51* 42* 34* 16 12  --   CREATININE 2.77* 1.73* 1.11* 0.60 0.79  --   CALCIUM 7.8* 7.0* 7.5* 8.2* 8.5*  --   MG  --   --  1.4*  --   --  1.1*  PHOS  --   --  3.5  --   --   --    GFR: Estimated Creatinine Clearance: 64.3 mL/min (by C-G formula based on SCr of 0.79 mg/dL). Recent Labs  Lab 03/06/22 1453 03/06/22 1653 03/06/22 2139 03/07/22 0435 03/08/22 0356 03/09/22 0515 03/09/22 2113  PROCALCITON  --   --  18.27 8.58  --   --   --   WBC  --   --   --  9.6 8.6 9.1 10.0  LATICACIDVEN 2.9* 1.8  --   --   --   --   --     Liver Function Tests: Recent Labs  Lab 03/06/22 1617 03/09/22 2113  AST 41 21  ALT 27 15  ALKPHOS 97 70  BILITOT 0.5 <0.1*  PROT 7.4 6.2*  ALBUMIN 3.6 2.8*   Recent Labs  Lab 03/06/22 1617  LIPASE 21   No results for input(s): "AMMONIA" in the last 168 hours.  ABG    Component  Value Date/Time   PHART 7.32 (L) 03/09/2022 2110  PCO2ART 37 03/09/2022 2110   PO2ART 69 (L) 03/09/2022 2110   HCO3 19.1 (L) 03/09/2022 2110   TCO2 20.7 03/27/2014 1704   ACIDBASEDEF 6.4 (H) 03/09/2022 2110   O2SAT 93.8 03/09/2022 2110     Coagulation Profile: Recent Labs  Lab 03/06/22 1617  INR 0.8    Cardiac Enzymes: No results for input(s): "CKTOTAL", "CKMB", "CKMBINDEX", "TROPONINI" in the last 168 hours.  HbA1C: Hgb A1c MFr Bld  Date/Time Value Ref Range Status  03/07/2022 04:35 AM 5.9 (H) 4.8 - 5.6 % Final    Comment:    (NOTE) Pre diabetes:          5.7%-6.4%  Diabetes:              >6.4%  Glycemic control for   <7.0% adults with diabetes   08/24/2021 04:02 AM 6.5 (H) 4.8 - 5.6 % Final    Comment:    (NOTE) Pre diabetes:          5.7%-6.4%  Diabetes:              >6.4%  Glycemic control for   <7.0% adults with diabetes     CBG: Recent Labs  Lab 03/09/22 0726 03/09/22 1110 03/09/22 1620 03/09/22 2005 03/10/22 0843  GLUCAP 125* 153* 150* 153* 170*       Past Medical History:  She,  has a past medical history of Anemia, Anxiety, Asthma, Bipolar 1 disorder ([place]), Chronic abdominal pain, COPD (chronic obstructive pulmonary disease) ([place]), Depression, Diabetes mellitus, Diabetic neuropathy ([place]) (10/29/2017), Esophagitis, Hiatal hernia, Hyperlipidemia (02/03/2012), Iron deficiency anemia due to chronic blood loss (01/29/2022), Migraine, Neck pain (06/08/2012), and Pancreatitis chronic (Idiopathic).   Surgical History:   Past Surgical History:  Procedure Laterality Date   ABDOMINAL HYSTERECTOMY     attempted colonoscopy  02/2017   Dr. Newman Pies: Poor prep, scope passed to mid transverse colon before colonoscopy aborted.  No significant findings noted to mid transverse colon.  Next colonoscopy in 2 years.   CELIAC PLEXUS BLOCK     CHOLECYSTECTOMY     COLONOSCOPY N/A 12/05/2012   NWG:NFAOZH rectum, colon and terminal ileum   ESOPHAGOGASTRODUODENOSCOPY   02/20/2008   YQM:VHQIONGEX distal esophageal mucosa, suspicious for neoplasm/Hiatal hernia otherwise normal    ESOPHAGOGASTRODUODENOSCOPY  04/03/2008   BMW:[id number] hiatal hernia, otherwise normal/Short, tight, benign-appearing peptic stricture   ESOPHAGOGASTRODUODENOSCOPY  November 16, 2012   Dr. Britta Mccreedy: hiatal hernia, esophagitis,    ESOPHAGOGASTRODUODENOSCOPY (EGD) WITH PROPOFOL N/A 05/09/2019   Dr. Oneida Alar: Large hiatal hernia with erythema and edema in the pouch, mild gastritis.  No biopsies taken.   ESOPHAGOGASTRODUODENOSCOPY (EGD) WITH PROPOFOL N/A 05/26/2019   Procedure: ESOPHAGOGASTRODUODENOSCOPY (EGD) WITH PROPOFOL;  Surgeon: Danie Binder, MD;  Location: [facility];  Service: Endoscopy;  Laterality: N/A;   EUS  12/2018   Dr. Newman Pies: LA grade B esophagitis, antritis but negative H. pylori, 1 cm ulcer at the duodenal sweep   GIVENS CAPSULE STUDY N/A 12/05/2012   Few superficial erosions but nothing found to explain iron deficiency anemia.    GIVENS CAPSULE STUDY N/A 05/26/2019   Procedure: GIVENS CAPSULE STUDY;  Surgeon: Danie Binder, MD;  Location: [facility];  Service: Endoscopy;  Laterality: N/A;   Ileocolonoscopy  06/05/2008     DGU:YQIHKVQ anal canal, otherwise normal rectum, colon   TONSILLECTOMY       Social History:   reports that she has been smoking cigarettes. She has a 25.00 pack-year smoking history. She has been exposed to  tobacco smoke. She has never used smokeless tobacco. She reports that she does not drink alcohol and does not use drugs.   Family History:  Her family history includes Asthma in her paternal grandfather and another family member; Cancer in her maternal grandmother; Diabetes in her paternal grandmother; Heart attack in her paternal grandfather; Hypertension in her mother. There is no history of Coronary artery disease or Colon cancer.   Allergies Allergies  Allergen Reactions   Penicillins Shortness Of Breath     Has patient had a  PCN reaction causing immediate rash, facial/tongue/throat swelling, SOB or lightheadedness with hypotension: Yes Has patient had a PCN reaction causing severe rash involving mucus membranes or skin necrosis: Yes Has patient had a PCN reaction that required hospitalization No Has patient had a PCN reaction occurring within the last 10 years: No  If all of the above answers are "NO", then may proceed with Cephalosporin use. Tolerated ceftriaxone   Sulfa Antibiotics Shortness Of Breath   Aspirin Nausea And Vomiting   Enoxaparin Other (See Comments)    Has been known to cause her blood clots in her legs Has been known to cause her blood clots in her legs   Lovenox  [Enoxaparin Sodium] Other (See Comments)    Has been known to cause her blood clots in her legs   Prednisone Other (See Comments)    Pancreatitis, but has to take for asthma sometimes     Home Medications  Prior to Admission medications   Medication Sig Start Date End Date Taking? Authorizing Provider  albuterol (PROVENTIL HFA;VENTOLIN HFA) 108 (90 BASE) MCG/ACT inhaler Inhale 2 puffs into the lungs daily as needed for wheezing. For shortness of breath   Yes [provider]  albuterol (PROVENTIL) (2.5 MG/3ML) 0.083% nebulizer solution Take 2.5 mg by nebulization every 6 (six) hours as needed for wheezing or shortness of breath.  05/24/19  Yes [provider]  amitriptyline (ELAVIL) 100 MG tablet Take 100 mg by mouth at bedtime.   Yes [provider]  atorvastatin (LIPITOR) 20 MG tablet Take 20 mg by mouth daily.   Yes [provider]  bisacodyl (DULCOLAX) 5 MG EC tablet Take 1 tablet (5 mg total) by mouth 2 (two) times daily. 10/09/19  Yes Wieters, Hallie C, PA-C  budesonide-formoterol (SYMBICORT) 80-4.5 MCG/ACT inhaler Inhale 2 puffs into the lungs 2 (two) times daily. Patient taking differently: Inhale 2 puffs into the lungs daily. 03/30/14  Yes Samuella Cota, MD  busPIRone (BUSPAR) 10 MG  tablet Take 10 mg by mouth 3 (three) times daily.   Yes [provider]  citalopram (CELEXA) 40 MG tablet Take 40 mg by mouth daily. 05/17/19  Yes [provider]  cyclobenzaprine (FLEXERIL) 5 MG tablet Take 5 mg by mouth 3 (three) times daily as needed for muscle spasms. 02/13/22  Yes [provider]  Docusate Sodium (DSS) 100 MG CAPS Take 1 capsule by mouth at bedtime.   Yes [provider]  ferrous sulfate 325 (65 FE) MG tablet Take 325 mg by mouth daily.   Yes [provider]  furosemide (LASIX) 20 MG tablet Take 20 mg by mouth daily. 02/19/20  Yes [provider]  gabapentin (NEURONTIN) 300 MG capsule Take 300 mg by mouth 3 (three) times daily.  04/11/19  Yes [provider]  levothyroxine (SYNTHROID, LEVOTHROID) 25 MCG tablet Take 25 mcg by mouth daily before breakfast.   Yes [provider]  lisinopril (PRINIVIL,ZESTRIL) 2.5 MG tablet Take  2.5 mg by mouth daily.  01/18/18  Yes [provider]  magnesium oxide (MAG-OX) 400 (240 Mg) MG tablet Take 1 tablet (400 mg total) by mouth 2 (two) times daily. 08/26/21  Yes Tat, Shanon Brow, MD  metFORMIN (GLUCOPHAGE) 500 MG tablet Take 2 tablets by mouth 2 (two) times daily.   Yes [provider]  methocarbamol (ROBAXIN) 500 MG tablet Take 500 mg by mouth 3 (three) times daily.   Yes [provider]  ondansetron (ZOFRAN) 4 MG tablet Take 4 mg by mouth every 8 (eight) hours as needed for vomiting or nausea. 06/02/19  Yes [provider]  oxyCODONE (ROXICODONE) 15 MG immediate release tablet Take 15 mg by mouth 4 (four) times daily.  05/22/19  Yes [provider]  OXYGEN Inhale 3 L into the lungs daily as needed (for shortness of breath).   Yes [provider]  Pancrelipase, Lip-Prot-Amyl, (ZENPEP) 20000-63000 units CPEP Take 1 capsule by mouth 3 times a day with meals and 1 capsules with snacks. 09/11/19  Yes [provider]   pantoprazole (PROTONIX) 40 MG tablet Take 40 mg by mouth 2 (two) times daily.   Yes [provider]  polyethylene glycol powder (GLYCOLAX/MIRALAX) powder Take 17 g by mouth 2 (two) times daily. Until daily soft stools  OTC Patient taking differently: Take 17 g by mouth as needed for mild constipation. Until daily soft stools  OTC 03/05/15  Yes Nona Dell, PA-C  SPIRIVA HANDIHALER 18 MCG inhalation capsule Place 18 mcg into inhaler and inhale daily.  04/11/19  Yes [provider]  valACYclovir (VALTREX) 500 MG tablet Take 500 mg by mouth daily.   Yes [provider]  azithromycin (ZITHROMAX) 500 MG tablet Take 1 tablet (500 mg total) by mouth daily. Patient not taking: Reported on 03/06/2022 08/27/21   Orson Eva, MD  cefdinir (OMNICEF) 300 MG capsule Take 1 capsule (300 mg total) by mouth every 12 (twelve) hours. Patient not taking: Reported on 03/06/2022 08/26/21   Orson Eva, MD  mupirocin cream (BACTROBAN) 2 % Apply 1 application topically daily. Apply to right 2nd toe and left great toe once daily until healed. 03/13/20   Marzetta Board, DPM     The patient is critically ill with multiple organ systems failure and requires high complexity decision making for assessment and support, frequent evaluation and titration of therapies, application of advanced monitoring technologies and extensive interpretation of multiple databases. Critical Care Time devoted to patient care services described in this note is 42 minutes.   Christinia Gully, MD [facility] [place] [phone]   After 7:00 pm call Elink  [phone]

## 2022-03-10 NOTE — Progress Notes (Signed)
PROGRESS NOTE     [name], is a 51 y.o. female, DOB - August 15, 1970, HER:[id number]  Admit date - 03/06/2022   Admitting Physician No admitting provider for patient encounter.  Outpatient Primary MD for the patient is Redmond School, MD  LOS - 4  Chief Complaint  Patient presents with   Back Pain      Brief Narrative:  51 y.o. female with medical history significant of type 2 diabetes, hypothyroidism, COPD/asthma, hyperlipidemia, chronic pancreatitis admitted on 03/06/2022 with concerns for acute GI bleed and possible ileus versus partial small bowel obstruction as well as sepsis from pneumonia probably aspiration related    -Assessment and Plan:  1)Sepsis with Septic shock-due to aspiration pneumonia/CAP--POA -Patient met sepsis shock criteria on admission -Weaned off IV Levophed completely -Leukocytosis resolved -PCT 8.58 -Urine and blood cultures NGTD Continue  azithromycin -Switch cefepime to Rocephin -Stopped IV vancomycin if MRSA PCR is negative -Repeat chest x-ray on 03/11/2022  2)Acute acute hypoxic respiratory failure---  --Repeat x-rays on 03/10/2022 with pneumonia Versus CHF Troponin 19,  repeat 17 -Continue antibiotics as above #1 -Continue IV Lasix -Repeat chest x-ray as above #1 -Pulmonology consult appreciated -03/10/22 -Decompensated overnight with increased oxygen requirement transferred to stepdown for continuous BiPAP -Received IV Lasix  3)Acute on chronic Anemia--- heme positive stool and hematemesis -Events work-up was consistent with iron and B12 deficiency Hgb 11.8 >>8.7>>8.6>.9.9  -GI consult appreciated -Continue IV Protonix -Possible EGD when respiratory status improved -Patient is due for her B12 shot this will be given today -Patient gets outpatient iron infusion she can continue this postdischarge  4)Ileus Versus partial SBO----General surgery consult appreciated --multiple BMs with miralax, lactulose and Dulcolax  suppository -Clinically patient appears to have resolved ileus -General surgery input appreciated  5)AKI----acute kidney injury with hyperkalemia and metabolic acidosis -  creatinine on admission= 2.77 , - baseline creatinine = 0.7 to 0.9   ,  -creatinine has now normalized,  --renally adjust medications, avoid nephrotoxic agents / dehydration  / hypotension  6)DM2--- A1c 5.9 reflecting excellent diabetic control PTA -Hold metformin Use Novolog/Humalog Sliding scale insulin with Accu-Cheks/Fingersticks as ordered   6)HTN--patient with dehydration and septic shock -Continue to hold antihypertensives -Patient has been weaned off Levophed completely  8) hypomagnesemia- --- in the setting of vomiting and GI losses -Replaced  9) urinary retention and possible UTI -Discontinued Foley catheter that was placed in the ED -c/n Flomax   -Patient is voiding okay Urine culture NGTD  10) anxiety and depression--- stable, continue Celexa, buspirone  11)Hypothyroidism--- TSH WNL at 1.97 -Continue levothyroxine  12) subacute back pain--- patient has had back pain for the last couple weeks  -be judicious with opiates due to concerns about ileus/SBO -She was scheduled for outpatient work-up by her neurosurgeon apparently in [place] with neurosurgeon/orthopedic surgeon as outpatient for MRI and further evaluation  Disposition/Need for in-Hospital Stay- patient unable to be discharged at this time due to *sepsis requiring IV fluids and IV antibiotics -Dyspnea and hypoxia is worse  Status is: Inpatient   Disposition: The patient is from: Home              Anticipated d/c is to: Home              Anticipated d/c date is: 1 day              Patient currently is not medically stable to d/c. Barriers: Not Clinically Stable-   Code Status : -  Code Status: Full Code  Family Communication:   NA (patient is alert, awake and coherent)   DVT Prophylaxis  :   - SCDs   Place and  maintain sequential compression device Start: 03/09/22 2155 Place and maintain sequential compression device Start: 03/07/22 0441   Lab Results  Component Value Date   PLT 208 03/09/2022   Inpatient Medications  Scheduled Meds:  amitriptyline  100 mg Oral QHS   atorvastatin  20 mg Oral Daily   bisacodyl  10 mg Rectal QHS   busPIRone  10 mg Oral TID   Chlorhexidine Gluconate Cloth  6 each Topical Daily   citalopram  40 mg Oral Daily   dextromethorphan-guaiFENesin  1 tablet Oral BID   fluticasone furoate-vilanterol  1 puff Inhalation Daily   furosemide  20 mg Intravenous Q12H   gabapentin  300 mg Oral TID   insulin aspart  0-5 Units Subcutaneous QHS   insulin aspart  0-9 Units Subcutaneous TID WC   ipratropium-albuterol  3 mL Nebulization QID   levothyroxine  25 mcg Oral Q0600   methocarbamol  500 mg Oral TID   methylPREDNISolone (SOLU-MEDROL) injection  40 mg Intravenous Q12H   pantoprazole (PROTONIX) IV  40 mg Intravenous Q12H   pneumococcal 20-valent conjugate vaccine  0.5 mL Intramuscular Tomorrow-1000   polyethylene glycol  17 g Oral BID   tamsulosin  0.4 mg Oral Daily   umeclidinium bromide  1 puff Inhalation Daily   Continuous Infusions:  sodium chloride 10 mL/hr at 03/09/22 1122   azithromycin 500 mg (03/10/22 0503)   cefTRIAXone (ROCEPHIN)  IV Stopped (03/09/22 2210)   PRN Meds:.albuterol, fentaNYL (SUBLIMAZE) injection, ondansetron **OR** ondansetron (ZOFRAN) IV, oxyCODONE   Anti-infectives (From admission, onward)    Start     Dose/Rate Route Frequency Ordered Stop   03/09/22 2200  cefTRIAXone (ROCEPHIN) 2 g in sodium chloride 0.9 % 100 mL IVPB        2 g 200 mL/hr over 30 Minutes Intravenous Every 24 hours 03/09/22 1808     03/08/22 1600  vancomycin (VANCOREADY) IVPB 750 mg/150 mL  Status:  Discontinued        750 mg 150 mL/hr over 60 Minutes Intravenous Every 48 hours 03/06/22 1602 03/08/22 0812   03/08/22 1400  ceFEPIme (MAXIPIME) 2 g in sodium chloride  0.9 % 100 mL IVPB  Status:  Discontinued        2 g 200 mL/hr over 30 Minutes Intravenous Every 8 hours 03/08/22 0808 03/09/22 1808   03/07/22 0440  azithromycin (ZITHROMAX) 500 mg in sodium chloride 0.9 % 250 mL IVPB        500 mg 250 mL/hr over 60 Minutes Intravenous Every 24 hours 03/07/22 0440     03/06/22 1615  ceFEPIme (MAXIPIME) 2 g in sodium chloride 0.9 % 100 mL IVPB  Status:  Discontinued        2 g 200 mL/hr over 30 Minutes Intravenous Every 24 hours 03/06/22 1600 03/08/22 0808   03/06/22 1615  vancomycin (VANCOCIN) IVPB 1000 mg/200 mL premix        1,000 mg 200 mL/hr over 60 Minutes Intravenous  Once 03/06/22 1601 03/06/22 1906       Subjective: Myrel Wolin today has no fevers, no emesis,   - 03/10/22 -Decompensated overnight with increased oxygen requirement transferred to stepdown for continuous BiPAP -Received IV Lasix  Objective: Vitals:   03/10/22 1400 03/10/22 1505 03/10/22 1538 03/10/22 1900  BP:    130/68  Pulse: 93   84  Resp:  16   17  Temp:   98.5 F (36.9 C)   TempSrc:   Oral   SpO2: 93% 96%  (!) 88%  Weight:      Height:        Intake/Output Summary (Last 24 hours) at 03/10/2022 2001 Last data filed at 03/10/2022 1950 Gross per 24 hour  Intake --  Output 4550 ml  Net -4550 ml   Filed Weights   03/07/22 1352 03/08/22 0521 03/10/22 0431  Weight: 55.5 kg 58 kg 58.6 kg   Physical Exam  Gen:- Awake Alert,  in no apparent distress  HEENT:- [place].AT, No sclera icterus Neck-Supple Neck, +ve JVD,.  Nose- [place] 8L/min Lungs-diminished breath sounds with bibasilar rales CV- S1, S2 normal, regular  Abd-  +ve B.Sounds, Abd Soft, No tenderness,    Extremity/Skin:- No  edema, pedal pulses present  Psych-affect is appropriate, oriented x3 Neuro-generalized weakness, no new focal deficits, no tremors  Data Reviewed: I have personally reviewed following labs and imaging studies  CBC: Recent Labs  Lab 03/06/22 1432 03/06/22 1754 03/07/22 0435  03/08/22 0356 03/09/22 0515 03/09/22 2113  WBC 14.2*  --  9.6 8.6 9.1 10.0  NEUTROABS 11.6*  --   --   --   --   --   HGB 11.8* 10.0* 8.7* 8.6* 8.5* 9.9*  HCT 37.8 34.7* 28.2* 28.0* 29.1* 32.3*  MCV 90.6  --  91.6 92.4 93.9 92.3  PLT 322  --  185 193 208 355   Basic Metabolic Panel: Recent Labs  Lab 03/06/22 1432 03/06/22 2139 03/07/22 0435 03/08/22 0356 03/09/22 2113 03/10/22 0358  NA 133* 138 139 137 140  --   K 5.9* 4.8 4.8 4.5 3.6  --   CL 96* 108 112* 112* 110  --   CO2 18* _0 18*  --   GLUCOSE 131* 95 133* 156* 138*  --   BUN 51* 42* 34* 16 12  --   CREATININE 2.77* 1.73* 1.11* 0.60 0.79  --   CALCIUM 7.8* 7.0* 7.5* 8.2* 8.5*  --   MG  --   --  1.4*  --   --  1.1*  PHOS  --   --  3.5  --   --   --    GFR: Estimated Creatinine Clearance: 64.3 mL/min (by C-G formula based on SCr of 0.79 mg/dL). Liver Function Tests: Recent Labs  Lab 03/06/22 1617 03/09/22 2113  AST 41 21  ALT 27 15  ALKPHOS 97 70  BILITOT 0.5 <0.1*  PROT 7.4 6.2*  ALBUMIN 3.6 2.8*   HbA1C: No results for input(s): "HGBA1C" in the last 72 hours.  ) Recent Results (from the past 240 hour(s))  Culture, blood (Routine x 2)     Status: None (Preliminary result)   Collection Time: 03/06/22  2:53 PM   Specimen: BLOOD RIGHT ARM  Result Value Ref Range Status   Specimen Description   Final    BLOOD RIGHT ARM BOTTLES DRAWN AEROBIC AND ANAEROBIC   Special Requests Blood Culture adequate volume  Final   Culture   Final    NO GROWTH 4 DAYS Performed at [facility], [address], [place], [place] [address]    Report Status PENDING  Incomplete  Culture, blood (Routine x 2)     Status: Abnormal   Collection Time: 03/06/22  4:35 PM   Specimen: Left Antecubital; Blood  Result Value Ref Range Status   Specimen Description   Final  LEFT ANTECUBITAL BOTTLES DRAWN AEROBIC AND ANAEROBIC Performed at [facility], [address] [place], [place] [address]    Special Requests   Final     Blood Culture adequate volume Performed at [facility], [address], [place], [place] [address]    Culture  Setup Time   Final    GRAM POSITIVE RODS AEROBIC BOTTLE ONLY Gram Stain Report Called to,Read Back By and Verified With: Morton Peters 2150 03/07/22 BY GMCGEEHON WORK DONE AT [facility] GRAM STAIN REVIEWED-AGREE WITH RESULT    Culture (A)  Final    BACILLUS SPECIES NOT ANTHRACIS Standardized susceptibility testing for this organism is not available. Performed at [facility], [place] [address] [place], [place] [address]    Report Status 03/10/2022 FINAL  Final  Urine Culture     Status: None   Collection Time: 03/06/22  6:38 PM   Specimen: In/Out Cath Urine  Result Value Ref Range Status   Specimen Description   Final    IN/OUT CATH URINE Performed at [facility], [address] [place], [place] [address]    Special Requests   Final    NONE Performed at [facility], [address], [place], [place] [address]    Culture   Final    NO GROWTH Performed at [facility], [place] [address] [place],  [address]    Report Status 03/08/2022 FINAL  Final  Resp Panel by RT-PCR (Flu A&B, Covid) Anterior Nasal Swab     Status: None   Collection Time: 03/06/22  9:30 PM   Specimen: Anterior Nasal Swab  Result Value Ref Range Status   SARS Coronavirus 2 by RT PCR NEGATIVE NEGATIVE Final    Comment: (NOTE) SARS-CoV-2 target nucleic acids are NOT DETECTED.  The SARS-CoV-2 RNA is generally detectable in upper respiratory specimens during the acute phase of infection. The lowest concentration of SARS-CoV-2 viral copies this assay can detect is 138 copies/mL. A negative result does not preclude SARS-Cov-2 infection and should not be used as the sole basis for treatment or other patient management decisions. A negative result may occur with  improper specimen collection/handling, submission of specimen other than nasopharyngeal swab, presence of viral mutation(s) within  the areas targeted by this assay, and inadequate number of viral copies(<138 copies/mL). A negative result must be combined with clinical observations, patient history, and epidemiological information. The expected result is Negative.  Fact Sheet for Patients:  EntrepreneurPulse.com.au  Fact Sheet for Healthcare Providers:  IncredibleEmployment.be  This test is no t yet approved or cleared by the [place] FDA and  has been authorized for detection and/or diagnosis of SARS-CoV-2 by FDA under an Emergency Use Authorization (EUA). This EUA will remain  in effect (meaning this test can be used) for the duration of the COVID-19 declaration under Section 564(b)(1) of the Act, 21 U.S.C.section 360bbb-3(b)(1), unless the authorization is terminated  or revoked sooner.       Influenza A by PCR NEGATIVE NEGATIVE Final   Influenza B by PCR NEGATIVE NEGATIVE Final    Comment: (NOTE) The Xpert Xpress SARS-CoV-2/FLU/RSV plus assay is intended as an aid in the diagnosis of influenza from Nasopharyngeal swab specimens and should not be used as a sole basis for treatment. Nasal washings and aspirates are unacceptable for Xpert Xpress SARS-CoV-2/FLU/RSV testing.  Fact Sheet for Patients: EntrepreneurPulse.com.au  Fact Sheet for Healthcare Providers: IncredibleEmployment.be  This test is not yet approved or cleared by the [place] FDA and has been  authorized for detection and/or diagnosis of SARS-CoV-2 by FDA under an Emergency Use Authorization (EUA). This EUA will remain in effect (meaning this test can be used) for the duration of the COVID-19 declaration under Section 564(b)(1) of the Act, 21 U.S.C. section 360bbb-3(b)(1), unless the authorization is terminated or revoked.  Performed at [facility], [address] [place], [place] [address]   MRSA Next Gen by PCR, Nasal     Status: None   Collection  Time: 03/07/22  3:42 PM   Specimen: Nasal Mucosa; Nasal Swab  Result Value Ref Range Status   MRSA by PCR Next Gen NOT DETECTED NOT DETECTED Final    Comment: (NOTE) The GeneXpert MRSA Assay (FDA approved for NASAL specimens only), is one component of a comprehensive MRSA colonization surveillance program. It is not intended to diagnose MRSA infection nor to guide or monitor treatment for MRSA infections. Test performance is not FDA approved in patients less than 90 years old. Performed at [facility], [address], [place], [place] [address]     Radiology Studies: DG Chest 2 View  Result Date: 03/10/2022 CLINICAL DATA:  Shortness of breath, cough EXAM: CHEST - 2 VIEW COMPARISON:  03/09/2022 FINDINGS: No significant change in examination with diffuse bilateral interstitial and heterogeneous airspace opacity superimposed upon emphysema. Heart and mediastinum are normal. Osseous structures unremarkable. IMPRESSION: No significant change in examination with diffuse bilateral interstitial and heterogeneous airspace opacity superimposed upon emphysema. Findings are consistent with edema or infection. No new airspace opacity. Electronically Signed   By: Delanna Ahmadi M.D.   On: 03/10/2022 08:45   CT Angio Chest Pulmonary Embolism (PE) W or WO Contrast  Result Date: 03/09/2022 CLINICAL DATA:  Left-sided chest pain and worsening shortness of breath EXAM: CT ANGIOGRAPHY CHEST WITH CONTRAST TECHNIQUE: Multidetector CT imaging of the chest was performed using the standard protocol during bolus administration of intravenous contrast. Multiplanar CT image reconstructions and MIPs were obtained to evaluate the vascular anatomy. RADIATION DOSE REDUCTION: This exam was performed according to the departmental dose-optimization program which includes automated exposure control, adjustment of the mA and/or kV according to patient size and/or use of iterative reconstruction technique. CONTRAST:  34m OMNIPAQUE  IOHEXOL 350 MG/ML SOLN COMPARISON:  08/25/2021 CT, chest x-ray from earlier in the same day. FINDINGS: Cardiovascular: Thoracic aorta and its branches demonstrate atherosclerotic calcification. No aneurysmal dilatation or dissection is noted. Heart is not significantly enlarged in size. The pulmonary artery is well visualized within normal branching pattern. No definitive filling defect to suggest pulmonary embolism is noted. Mediastinum/Nodes: Thoracic inlet is within normal limits. No hilar or mediastinal adenopathy is noted. Moderate to large-sized hiatal hernia is noted. The remainder of the esophagus appears within normal limits. Lungs/Pleura: Significant progressive airspace opacity is noted throughout both lungs consistent with multifocal pneumonia. Small bilateral pleural effusions are seen. No sizable parenchymal nodules are noted. No cavitary lesions are seen. Upper Abdomen: Visualized upper abdomen is otherwise within normal limits aside from the hiatal hernia. Musculoskeletal: No acute rib abnormality is noted. Mild degenerative changes of the thoracic spine are seen. Chronic T12 and L1 compression deformities are noted. The L1 fracture is new from the prior CT. Review of the MIP images confirms the above findings. IMPRESSION: No evidence of pulmonary emboli. Moderate to large hiatal hernia. Significant airspace opacities throughout both lung similar to that seen on prior chest x-ray consistent with acute multifocal pneumonia. Small bilateral pleural effusions are noted as well. Aortic Atherosclerosis (ICD10-I70.0). Electronically Signed  By: Inez Catalina M.D.   On: 03/09/2022 21:08   DG CHEST PORT 1 VIEW  Result Date: 03/09/2022 CLINICAL DATA:  Hypoxia EXAM: PORTABLE CHEST 1 VIEW COMPARISON:  Abdominal series 03/09/2022.  Chest CT 08/25/2021 FINDINGS: Diffuse reticulonodular opacities are seen throughout both lungs. Superimposed patchy airspace disease is seen in the bilateral mid lungs and right  upper lobe, right greater than left. Costophrenic angles are clear. No pneumothorax. Cardiomediastinal silhouette is within normal limits. Large hiatal hernia again noted. No acute fractures. IMPRESSION: 1. Diffuse reticulonodular opacities throughout both lungs may be infectious or inflammatory. 2. Superimposed bilateral airspace disease, right greater than left. Electronically Signed   By: Ronney Asters M.D.   On: 03/09/2022 20:46   DG ABD ACUTE 2+V W 1V CHEST  Result Date: 03/09/2022 CLINICAL DATA:  Abdominal pain, shortness of breath EXAM: DG ABDOMEN ACUTE WITH 1 VIEW CHEST COMPARISON:  None Available. FINDINGS: Bowel gas pattern is unremarkable. No air-fluid levels or free air. Cholecystectomy clips. Interstitial changes with patchy opacities. Normal heart size. No pleural effusion or pneumothorax. IMPRESSION: Normal bowel gas pattern. Interstitial changes with patchy opacities. Similar findings are present in January. May reflect recurrent pneumonia superimposed on chronic disease. Electronically Signed   By: Macy Mis M.D.   On: 03/09/2022 08:20     Scheduled Meds:  amitriptyline  100 mg Oral QHS   atorvastatin  20 mg Oral Daily   bisacodyl  10 mg Rectal QHS   busPIRone  10 mg Oral TID   Chlorhexidine Gluconate Cloth  6 each Topical Daily   citalopram  40 mg Oral Daily   dextromethorphan-guaiFENesin  1 tablet Oral BID   fluticasone furoate-vilanterol  1 puff Inhalation Daily   furosemide  20 mg Intravenous Q12H   gabapentin  300 mg Oral TID   insulin aspart  0-5 Units Subcutaneous QHS   insulin aspart  0-9 Units Subcutaneous TID WC   ipratropium-albuterol  3 mL Nebulization QID   levothyroxine  25 mcg Oral Q0600   methocarbamol  500 mg Oral TID   methylPREDNISolone (SOLU-MEDROL) injection  40 mg Intravenous Q12H   pantoprazole (PROTONIX) IV  40 mg Intravenous Q12H   pneumococcal 20-valent conjugate vaccine  0.5 mL Intramuscular Tomorrow-1000   polyethylene glycol  17 g Oral BID    tamsulosin  0.4 mg Oral Daily   umeclidinium bromide  1 puff Inhalation Daily   Continuous Infusions:  sodium chloride 10 mL/hr at 03/09/22 1122   azithromycin 500 mg (03/10/22 0503)   cefTRIAXone (ROCEPHIN)  IV Stopped (03/09/22 2210)    LOS: 4 days   Roxan Hockey M.D on 03/10/2022 at 8:01 PM  Go to [email or web address] - for contact info  [facility] - Office  [phone]  If 7PM-7AM, please contact night-coverage [email or web address] 03/10/2022, 8:01 PM

## 2022-03-11 ENCOUNTER — Inpatient Hospital Stay (HOSPITAL_COMMUNITY): Payer: Medicare Other

## 2022-03-11 DIAGNOSIS — D62 Acute posthemorrhagic anemia: Secondary | ICD-10-CM | POA: Diagnosis not present

## 2022-03-11 DIAGNOSIS — K921 Melena: Secondary | ICD-10-CM | POA: Diagnosis not present

## 2022-03-11 DIAGNOSIS — I10 Essential (primary) hypertension: Secondary | ICD-10-CM

## 2022-03-11 DIAGNOSIS — R0609 Other forms of dyspnea: Secondary | ICD-10-CM

## 2022-03-11 DIAGNOSIS — A419 Sepsis, unspecified organism: Secondary | ICD-10-CM | POA: Diagnosis not present

## 2022-03-11 DIAGNOSIS — N179 Acute kidney failure, unspecified: Secondary | ICD-10-CM

## 2022-03-11 DIAGNOSIS — E039 Hypothyroidism, unspecified: Secondary | ICD-10-CM

## 2022-03-11 LAB — CBC
HCT: 28.5 % — ABNORMAL LOW (ref 36.0–46.0)
Hemoglobin: 9.2 g/dL — ABNORMAL LOW (ref 12.0–15.0)
MCH: 28 pg (ref 26.0–34.0)
MCHC: 32.3 g/dL (ref 30.0–36.0)
MCV: 86.9 fL (ref 80.0–100.0)
Platelets: 198 10*3/uL (ref 150–400)
RBC: 3.28 MIL/uL — ABNORMAL LOW (ref 3.87–5.11)
RDW: 21 % — ABNORMAL HIGH (ref 11.5–15.5)
WBC: 8.1 10*3/uL (ref 4.0–10.5)
nRBC: 0.2 % (ref 0.0–0.2)

## 2022-03-11 LAB — ECHOCARDIOGRAM COMPLETE
AR max vel: 2.48 cm2
AV Area VTI: 2.98 cm2
AV Area mean vel: 2.82 cm2
AV Mean grad: 3 mmHg
AV Peak grad: 8.6 mmHg
Ao pk vel: 1.47 m/s
Area-P 1/2: 4.65 cm2
Calc EF: 58.7 %
Height: 62 in
MV VTI: 2.59 cm2
S' Lateral: 2.6 cm
Single Plane A2C EF: 55 %
Single Plane A4C EF: 61.9 %
Weight: 1964.74 oz

## 2022-03-11 LAB — GLUCOSE, CAPILLARY
Glucose-Capillary: 140 mg/dL — ABNORMAL HIGH (ref 70–99)
Glucose-Capillary: 129 mg/dL — ABNORMAL HIGH (ref 70–99)
Glucose-Capillary: 91 mg/dL (ref 70–99)
Glucose-Capillary: 169 mg/dL — ABNORMAL HIGH (ref 70–99)
Glucose-Capillary: 118 mg/dL — ABNORMAL HIGH (ref 70–99)

## 2022-03-11 LAB — CULTURE, BLOOD (ROUTINE X 2)
Culture: NO GROWTH
Special Requests: ADEQUATE

## 2022-03-11 LAB — RENAL FUNCTION PANEL
Albumin: 2.5 g/dL — ABNORMAL LOW (ref 3.5–5.0)
Anion gap: 9 (ref 5–15)
BUN: 9 mg/dL (ref 6–20)
CO2: 29 mmol/L (ref 22–32)
Calcium: 8.5 mg/dL — ABNORMAL LOW (ref 8.9–10.3)
Chloride: 102 mmol/L (ref 98–111)
Creatinine, Ser: 0.53 mg/dL (ref 0.44–1.00)
GFR, Estimated: >60 mL/min (ref >60)
Glucose, Bld: 173 mg/dL — ABNORMAL HIGH (ref 70–99)
Phosphorus: 4.6 mg/dL (ref 2.5–4.6)
Potassium: 3.7 mmol/L (ref 3.5–5.1)
Sodium: 140 mmol/L (ref 135–145)

## 2022-03-11 MED ORDER — SALINE SPRAY 0.65 % NA SOLN
1.0000 | NASAL | Status: DC | PRN
  Administered 2022-03-11: 1 via NASAL
  Filled 2022-03-11: qty 44

## 2022-03-11 MED ORDER — DOCUSATE SODIUM 50 MG PO CAPS
50.0000 mg | ORAL_CAPSULE | Freq: Two times a day (BID) | ORAL | Status: DC
  Filled 2022-03-11 (×5): qty 1

## 2022-03-11 MED ORDER — DOCUSATE SODIUM 100 MG PO CAPS
100.0000 mg | ORAL_CAPSULE | Freq: Every day | ORAL | Status: DC
  Administered 2022-03-11 – 2022-03-13 (×3): 100 mg via ORAL
  Filled 2022-03-11 (×3): qty 1

## 2022-03-11 MED ORDER — POLYETHYLENE GLYCOL 3350 17 G PO PACK
17.0000 g | PACK | Freq: Every day | ORAL | Status: DC
  Administered 2022-03-12: 17 g via ORAL
  Filled 2022-03-11 (×2): qty 1

## 2022-03-11 MED ORDER — IPRATROPIUM-ALBUTEROL 0.5-2.5 (3) MG/3ML IN SOLN
RESPIRATORY_TRACT | Status: AC
  Filled 2022-03-11: qty 3

## 2022-03-11 MED ORDER — FUROSEMIDE 10 MG/ML IJ SOLN
40.0000 mg | Freq: Two times a day (BID) | INTRAMUSCULAR | Status: DC
  Administered 2022-03-11 – 2022-03-13 (×4): 40 mg via INTRAVENOUS
  Filled 2022-03-11 (×4): qty 4

## 2022-03-11 MED ORDER — HYDRALAZINE HCL 20 MG/ML IJ SOLN
10.0000 mg | Freq: Once | INTRAMUSCULAR | Status: AC
  Administered 2022-03-11: 10 mg via INTRAVENOUS
  Filled 2022-03-11: qty 1

## 2022-03-11 MED ORDER — MAGNESIUM SULFATE 2 GM/50ML IV SOLN
2.0000 g | Freq: Once | INTRAVENOUS | Status: AC
  Administered 2022-03-11: 2 g via INTRAVENOUS
  Filled 2022-03-11: qty 50

## 2022-03-11 NOTE — Progress Notes (Signed)
PROGRESS NOTE     [name], is a 51 y.o. female, DOB - December 30, 1970, CBS:[id number]  Admit date - 03/06/2022   Admitting Physician No admitting provider for patient encounter.  Outpatient Primary MD for the patient is Redmond School, MD  LOS - 5  Chief Complaint  Patient presents with   Back Pain      Brief Narrative:  51 y.o. female with medical history significant of type 2 diabetes, hypothyroidism, COPD/asthma, hyperlipidemia, chronic pancreatitis admitted on 03/06/2022 with concerns for acute GI bleed and possible ileus versus partial small bowel obstruction as well as sepsis from pneumonia probably aspiration related    -Assessment and Plan:  1)Sepsis with Septic shock-due to aspiration pneumonia/CAP--POA -Patient met sepsis shock criteria on admission -Continue to weaned off oxygen supplementation as tolerated -Currently not requiring pressor support. -Leukocytosis resolved -PCT 8.58 -Continue current IV antibiotics -Follow culture results; continue flutter valve/incentive spirometry. -Continue bronchodilators. -Patient is feeling better.  2)Acute acute hypoxic respiratory failure---  --Repeat x-rays on 03/10/2022 with concern for worsening pneumonia Versus CHF Troponin 19,  repeat 17 -Continue treatment with antibiotics as mentioned in problem 1 -Continue IV Lasix -Continue to wean off oxygen supplementation as tolerated -Continue to follow recommendations by PCCM. -Continue the use of BiPAP as needed  3)Acute on chronic Anemia--- heme positive stool and hematemesis -Events work-up was consistent with iron and B12 deficiency Hgb 11.8 >>8.7>>8.6>.9.9  -GI consult appreciated -Continue IV Protonix -Planning for possible EGD when respiratory status improved -Continue B12 supplementation. -Patient gets outpatient iron infusion she can continue this postdischarge.  4)Ileus Versus partial SBO----General surgery consult appreciated --multiple BMs with miralax,  lactulose and Dulcolax suppository. -Diet will be advanced -No surgical needs currently. -Continue increasing activity -Follow electrolytes replete as needed.  5)AKI----acute kidney injury with hyperkalemia and metabolic acidosis -creatinine on admission= 2.77 , -baseline creatinine = 0.7 to 0.9   ,  -creatinine has now normalized, close monitoring will be followed as patient in need of diuretics. -- Continue renally adjusting medications, avoid nephrotoxic agents / dehydration  / hypotension  6)DM2--- A1c 5.9 reflecting excellent diabetic control PTA -Continue holding metformin while inpatient -Continue the use of sliding scale insulin -Follow CBGs and adjust hypoglycemic regimen as needed.   6)HTN-- -Continue to hold antihypertensives -Patient has been weaned off Levophed completely -Follow vital signs. -Heart healthy/low-sodium diet discussed with patient.  8) hypomagnesemia- --- in the setting of vomiting and GI losses -Continue to follow electrolytes and replete as needed.  9) urinary retention and possible UTI -Discontinued Foley catheter that was placed in the ED -c/n Flomax   -Patient is voiding okay -Urine culture NGTD -Continue to maintain adequate hydration and follow response.  10) anxiety and depression--- stable, continue Celexa, buspirone  11)Hypothyroidism--- TSH WNL at 1.97 -Continue levothyroxine -Thyroid panel follow-up as an outpatient in the next  12) subacute back pain--- patient has had back pain for the last couple weeks  -be judicious with opiates due to concerns about ileus/SBO -She was scheduled for outpatient work-up by her neurosurgeon apparently in [place] with neurosurgeon/orthopedic surgeon as outpatient for MRI and further evaluation  Disposition/Need for in-Hospital Stay- patient unable to be discharged at this time due to *sepsis requiring IV fluids and IV antibiotics -Dyspnea and hypoxia is worse  Status is: Inpatient    Disposition: The patient is from: Home              Anticipated d/c is to: Home  Anticipated d/c date is: 2 days              Patient currently is not medically stable to d/c.  Barriers: Not Clinically Stable-   Code Status : -  Code Status: Full Code   Family Communication:   NA (patient is alert, awake and coherent)   DVT Prophylaxis  :   - SCDs   Place and maintain sequential compression device Start: 03/09/22 2155 Place and maintain sequential compression device Start: 03/07/22 0441   Lab Results  Component Value Date   PLT 198 03/11/2022   Inpatient Medications  Scheduled Meds:  amitriptyline  100 mg Oral QHS   atorvastatin  20 mg Oral Daily   busPIRone  10 mg Oral TID   Chlorhexidine Gluconate Cloth  6 each Topical Daily   citalopram  40 mg Oral Daily   dextromethorphan-guaiFENesin  1 tablet Oral BID   docusate sodium  50 mg Oral BID   fluticasone furoate-vilanterol  1 puff Inhalation Daily   furosemide  40 mg Intravenous Q12H   gabapentin  300 mg Oral TID   insulin aspart  0-5 Units Subcutaneous QHS   insulin aspart  0-9 Units Subcutaneous TID WC   levothyroxine  25 mcg Oral Q0600   methocarbamol  500 mg Oral TID   methylPREDNISolone (SOLU-MEDROL) injection  40 mg Intravenous Q12H   pantoprazole (PROTONIX) IV  40 mg Intravenous Q12H   pneumococcal 20-valent conjugate vaccine  0.5 mL Intramuscular Tomorrow-1000   [START ON 03/12/2022] polyethylene glycol  17 g Oral Daily   tamsulosin  0.4 mg Oral Daily   umeclidinium bromide  1 puff Inhalation Daily   Continuous Infusions:  sodium chloride 10 mL/hr at 03/09/22 1122   azithromycin 500 mg (03/11/22 0411)   cefTRIAXone (ROCEPHIN)  IV 2 g (03/10/22 2131)   magnesium sulfate bolus IVPB     PRN Meds:.albuterol, fentaNYL (SUBLIMAZE) injection, ondansetron **OR** ondansetron (ZOFRAN) IV, oxyCODONE   Anti-infectives (From admission, onward)    Start     Dose/Rate Route Frequency Ordered Stop    03/09/22 2200  cefTRIAXone (ROCEPHIN) 2 g in sodium chloride 0.9 % 100 mL IVPB        2 g 200 mL/hr over 30 Minutes Intravenous Every 24 hours 03/09/22 1808     03/08/22 1600  vancomycin (VANCOREADY) IVPB 750 mg/150 mL  Status:  Discontinued        750 mg 150 mL/hr over 60 Minutes Intravenous Every 48 hours 03/06/22 1602 03/08/22 0812   03/08/22 1400  ceFEPIme (MAXIPIME) 2 g in sodium chloride 0.9 % 100 mL IVPB  Status:  Discontinued        2 g 200 mL/hr over 30 Minutes Intravenous Every 8 hours 03/08/22 0808 03/09/22 1808   03/07/22 0440  azithromycin (ZITHROMAX) 500 mg in sodium chloride 0.9 % 250 mL IVPB        500 mg 250 mL/hr over 60 Minutes Intravenous Every 24 hours 03/07/22 0440     03/06/22 1615  ceFEPIme (MAXIPIME) 2 g in sodium chloride 0.9 % 100 mL IVPB  Status:  Discontinued        2 g 200 mL/hr over 30 Minutes Intravenous Every 24 hours 03/06/22 1600 03/08/22 0808   03/06/22 1615  vancomycin (VANCOCIN) IVPB 1000 mg/200 mL premix        1,000 mg 200 mL/hr over 60 Minutes Intravenous  Once 03/06/22 1601 03/06/22 1906       Subjective: Krisha Dubray reports feeling better  today; still requiring high amount of oxygen supplementation.  No fever, no nausea, no vomiting, reports moving bowels.  Diet will be advanced; patient expressed no chest pain.  Continue IV diuresis and the use of as needed/nightly BiPAP.  Continue antibiotic for pneumonia.  Objective: Vitals:   03/11/22 0737 03/11/22 0800 03/11/22 0900 03/11/22 1000  BP:  (!) 177/84 (!) 149/48 112/81  Pulse: 74 73 70 93  Resp: (!) _0 Temp: 97.6 F (36.4 C)     TempSrc: Oral     SpO2: 100% 96% 100%   Weight:      Height:        Intake/Output Summary (Last 24 hours) at 03/11/2022 1054 Last data filed at 03/11/2022 1029 Gross per 24 hour  Intake 1127.21 ml  Output 3450 ml  Net -2322.79 ml   Filed Weights   03/08/22 0521 03/10/22 0431 03/11/22 0500  Weight: 58 kg 58.6 kg 55.7 kg   Physical  Exam General exam: Alert, awake, oriented x 3; afebrile, no chest pain, no nausea or vomiting.  Reports having bowel movement and feeling slightly better.  Still requiring high amount of oxygen supplementation and feeling short winded with activity.  Good urine output reported. Respiratory system: Pulse rhonchi bilaterally; no using accessory muscle.  Tachypnea reported with activity.  Mild expiratory wheezing on exam. Cardiovascular system:RRR. No rubs, gallops or JVD. Gastrointestinal system: Abdomen is nondistended, soft and nontender. No organomegaly or masses felt. Normal bowel sounds heard. Central nervous system: Alert and oriented. No focal neurological deficits. Extremities: No cyanosis, clubbing or edema. Skin: No petechiae. Psychiatry: Judgement and insight appear normal. Mood & affect appropriate.   Data Reviewed: I have personally reviewed following labs and imaging studies  CBC: Recent Labs  Lab 03/06/22 1432 03/06/22 1754 03/07/22 0435 03/08/22 0356 03/09/22 0515 03/09/22 2113 03/11/22 0406  WBC 14.2*  --  9.6 8.6 9.1 10.0 8.1  NEUTROABS 11.6*  --   --   --   --   --   --   HGB 11.8*   < > 8.7* 8.6* 8.5* 9.9* 9.2*  HCT 37.8   < > 28.2* 28.0* 29.1* 32.3* 28.5*  MCV 90.6  --  91.6 92.4 93.9 92.3 86.9  PLT 322  --  185 193 208 208 198   < > = values in this interval not displayed.   Basic Metabolic Panel: Recent Labs  Lab 03/06/22 2139 03/07/22 0435 03/08/22 0356 03/09/22 2113 03/10/22 0358 03/11/22 0406  NA 138 139 137 140  --  140  K 4.8 4.8 4.5 3.6  --  3.7  CL 108 112* 112* 110  --  102  CO2 _1 18*  --  29  GLUCOSE 95 133* 156* 138*  --  173*  BUN 42* 34* 16 12  --  9  CREATININE 1.73* 1.11* 0.60 0.79  --  0.53  CALCIUM 7.0* 7.5* 8.2* 8.5*  --  8.5*  MG  --  1.4*  --   --  1.1*  --   PHOS  --  3.5  --   --   --  4.6   GFR: Estimated Creatinine Clearance: 64.3 mL/min (by C-G formula based on SCr of 0.53 mg/dL).  Liver Function Tests: Recent  Labs  Lab 03/06/22 1617 03/09/22 2113 03/11/22 0406  AST 41 21  --   ALT 27 15  --   ALKPHOS 97 70  --   BILITOT 0.5 <0.1*  --  PROT 7.4 6.2*  --   ALBUMIN 3.6 2.8* 2.5*   HbA1C: No results for input(s): "HGBA1C" in the last 72 hours.  Recent Results (from the past 240 hour(s))  Culture, blood (Routine x 2)     Status: None   Collection Time: 03/06/22  2:53 PM   Specimen: BLOOD RIGHT ARM  Result Value Ref Range Status   Specimen Description   Final    BLOOD RIGHT ARM BOTTLES DRAWN AEROBIC AND ANAEROBIC   Special Requests Blood Culture adequate volume  Final   Culture   Final    NO GROWTH 5 DAYS Performed at [facility], [address] [place], [place] [address]    Report Status 03/11/2022 FINAL  Final  Culture, blood (Routine x 2)     Status: Abnormal   Collection Time: 03/06/22  4:35 PM   Specimen: Left Antecubital; Blood  Result Value Ref Range Status   Specimen Description   Final    LEFT ANTECUBITAL BOTTLES DRAWN AEROBIC AND ANAEROBIC Performed at [facility], [address], [place], [place] [address]    Special Requests   Final    Blood Culture adequate volume Performed at [facility], [address], [place], [place] [address]    Culture  Setup Time   Final    GRAM POSITIVE RODS AEROBIC BOTTLE ONLY Gram Stain Report Called to,Read Back By and Verified With: Morton Peters 2150 03/07/22 BY GMCGEEHON WORK DONE AT [facility] GRAM STAIN REVIEWED-AGREE WITH RESULT    Culture (A)  Final    BACILLUS SPECIES NOT ANTHRACIS Standardized susceptibility testing for this organism is not available. Performed at [facility], [place] [address] [place], [place] [address]    Report Status 03/10/2022 FINAL  Final  Urine Culture     Status: None   Collection Time: 03/06/22  6:38 PM   Specimen: In/Out Cath Urine  Result Value Ref Range Status   Specimen Description   Final    IN/OUT CATH URINE Performed at [facility], [address] [place], [place] [address]    Special  Requests   Final    NONE Performed at [facility], [address] [place], [place] [address]    Culture   Final    NO GROWTH Performed at [facility], [place] [address] [place], [place] [address]    Report Status 03/08/2022 FINAL  Final  Resp Panel by RT-PCR (Flu A&B, Covid) Anterior Nasal Swab     Status: None   Collection Time: 03/06/22  9:30 PM   Specimen: Anterior Nasal Swab  Result Value Ref Range Status   SARS Coronavirus 2 by RT PCR NEGATIVE NEGATIVE Final    Comment: (NOTE) SARS-CoV-2 target nucleic acids are NOT DETECTED.  The SARS-CoV-2 RNA is generally detectable in upper respiratory specimens during the acute phase of infection. The lowest concentration of SARS-CoV-2 viral copies this assay can detect is 138 copies/mL. A negative result does not preclude SARS-Cov-2 infection and should not be used as the sole basis for treatment or other patient management decisions. A negative result may occur with  improper specimen collection/handling, submission of specimen other than nasopharyngeal swab, presence of viral mutation(s) within the areas targeted by this assay, and inadequate number of viral copies(<138 copies/mL). A negative result must be combined with clinical observations, patient history, and epidemiological information. The expected result is Negative.  Fact Sheet for Patients:  EntrepreneurPulse.com.au  Fact Sheet for Healthcare Providers:  IncredibleEmployment.be  This test is no  t yet approved or cleared by the [place] and  has been authorized for detection and/or diagnosis of SARS-CoV-2 by FDA under an Emergency Use Authorization (EUA). This EUA will remain  in effect (meaning this test can be used) for the duration of the COVID-19 declaration under Section 564(b)(1) of the Act, 21 U.S.C.section 360bbb-3(b)(1), unless the authorization is terminated  or revoked sooner.       Influenza A by PCR  NEGATIVE NEGATIVE Final   Influenza B by PCR NEGATIVE NEGATIVE Final    Comment: (NOTE) The Xpert Xpress SARS-CoV-2/FLU/RSV plus assay is intended as an aid in the diagnosis of influenza from Nasopharyngeal swab specimens and should not be used as a sole basis for treatment. Nasal washings and aspirates are unacceptable for Xpert Xpress SARS-CoV-2/FLU/RSV testing.  Fact Sheet for Patients: EntrepreneurPulse.com.au  Fact Sheet for Healthcare Providers: IncredibleEmployment.be  This test is not yet approved or cleared by the [place] FDA and has been authorized for detection and/or diagnosis of SARS-CoV-2 by FDA under an Emergency Use Authorization (EUA). This EUA will remain in effect (meaning this test can be used) for the duration of the COVID-19 declaration under Section 564(b)(1) of the Act, 21 U.S.C. section 360bbb-3(b)(1), unless the authorization is terminated or revoked.  Performed at [facility], [address], [place], [place] [address]   MRSA Next Gen by PCR, Nasal     Status: None   Collection Time: 03/07/22  3:42 PM   Specimen: Nasal Mucosa; Nasal Swab  Result Value Ref Range Status   MRSA by PCR Next Gen NOT DETECTED NOT DETECTED Final    Comment: (NOTE) The GeneXpert MRSA Assay (FDA approved for NASAL specimens only), is one component of a comprehensive MRSA colonization surveillance program. It is not intended to diagnose MRSA infection nor to guide or monitor treatment for MRSA infections. Test performance is not FDA approved in patients less than 59 years old. Performed at [facility], [address] [place], [place] [address]     Radiology Studies: ECHOCARDIOGRAM COMPLETE  Result Date: 03/11/2022    ECHOCARDIOGRAM REPORT   Patient Name:   [name] Date of Exam: 03/11/2022 Medical Rec #:  [id number]         Height:       62.0 in Accession #:    [id number]        Weight:       122.8 lb Date of Birth:  [birth year]          BSA:          1.554 m Patient Age:    73 years          BP:           164/81 mmHg Patient Gender: F                 HR:           71 bpm. Exam Location:  [facility] Procedure: 2D Echo, Cardiac Doppler and Color Doppler Indications:    Dyspnea  History:        Patient has prior history of Echocardiogram examinations, most                 recent 12/15/2016. COPD; Risk Factors:Hypertension, Diabetes,                 Dyslipidemia and Current Smoker.  Sonographer:    Wenda Low Referring Phys: [place]  1. Left ventricular ejection fraction,  by estimation, is 60 to 65%. The left ventricle has normal function. The left ventricle has no regional wall motion abnormalities. Left ventricular diastolic parameters were normal.  2. Right ventricular systolic function is normal. The right ventricular size is normal. There is normal pulmonary artery systolic pressure. The estimated right ventricular systolic pressure is 80.8 mmHg.  3. Left atrial size was upper normal.  4. The mitral valve is grossly normal. Mild mitral valve regurgitation.  5. The aortic valve is tricuspid. Aortic valve regurgitation is not visualized. Aortic valve mean gradient measures 3.0 mmHg.  6. The inferior vena cava is normal in size with greater than 50% respiratory variability, suggesting right atrial pressure of 3 mmHg. Comparison(s): Prior images unable to be directly viewed. FINDINGS  Left Ventricle: Left ventricular ejection fraction, by estimation, is 60 to 65%. The left ventricle has normal function. The left ventricle has no regional wall motion abnormalities. The left ventricular internal cavity size was normal in size. There is  borderline left ventricular hypertrophy. Left ventricular diastolic parameters were normal. Right Ventricle: The right ventricular size is normal. No increase in right ventricular wall thickness. Right ventricular systolic function is normal. There is normal pulmonary artery  systolic pressure. The tricuspid regurgitant velocity is 2.39 m/s, and  with an assumed right atrial pressure of 3 mmHg, the estimated right ventricular systolic pressure is 81.1 mmHg. Left Atrium: Left atrial size was upper normal. Right Atrium: Right atrial size was normal in size. Pericardium: There is no evidence of pericardial effusion. Mitral Valve: The mitral valve is grossly normal. Mild mitral valve regurgitation. MV peak gradient, 6.2 mmHg. The mean mitral valve gradient is 2.0 mmHg. Tricuspid Valve: The tricuspid valve is grossly normal. Tricuspid valve regurgitation is trivial. Aortic Valve: The aortic valve is tricuspid. There is mild aortic valve annular calcification. Aortic valve regurgitation is not visualized. Aortic valve mean gradient measures 3.0 mmHg. Aortic valve peak gradient measures 8.6 mmHg. Aortic valve area, by  VTI measures 2.98 cm. Pulmonic Valve: The pulmonic valve was grossly normal. Pulmonic valve regurgitation is trivial. Aorta: The aortic root is normal in size and structure. Venous: The inferior vena cava is normal in size with greater than 50% respiratory variability, suggesting right atrial pressure of 3 mmHg. IAS/Shunts: No atrial level shunt detected by color flow Doppler.  LEFT VENTRICLE PLAX 2D LVIDd:         4.20 cm     Diastology LVIDs:         2.60 cm     LV e' medial:    11.10 cm/s LV PW:         1.00 cm     LV E/e' medial:  9.9 LV IVS:        0.90 cm     LV e' lateral:   10.30 cm/s LVOT diam:     2.00 cm     LV E/e' lateral: 10.7 LV SV:         87 LV SV Index:   56 LVOT Area:     3.14 cm  LV Volumes (MOD) LV vol d, MOD A2C: 34.0 ml LV vol d, MOD A4C: 48.8 ml LV vol s, MOD A2C: 15.3 ml LV vol s, MOD A4C: 18.6 ml LV SV MOD A2C:     18.7 ml LV SV MOD A4C:     48.8 ml LV SV MOD BP:      25.3 ml RIGHT VENTRICLE RV Basal diam:  3.15 cm RV Mid diam:  2.50 cm RV S prime:     13.40 cm/s TAPSE (M-mode): 3.3 cm LEFT ATRIUM             Index        RIGHT ATRIUM           Index  LA diam:        3.40 cm 2.19 cm/m   RA Area:     11.20 cm LA Vol (A2C):   66.9 ml 43.06 ml/m  RA Volume:   22.70 ml  14.61 ml/m LA Vol (A4C):   31.4 ml 20.21 ml/m LA Biplane Vol: 47.0 ml 30.25 ml/m  AORTIC VALVE                    PULMONIC VALVE AV Area (Vmax):    2.48 cm     PV Vmax:       1.08 m/s AV Area (Vmean):   2.82 cm     PV Peak grad:  4.7 mmHg AV Area (VTI):     2.98 cm AV Vmax:           147.00 cm/s AV Vmean:          75.400 cm/s AV VTI:            0.291 m AV Peak Grad:      8.6 mmHg AV Mean Grad:      3.0 mmHg LVOT Vmax:         116.00 cm/s LVOT Vmean:        67.800 cm/s LVOT VTI:          0.276 m LVOT/AV VTI ratio: 0.95  AORTA Ao Root diam: 2.80 cm MITRAL VALVE                TRICUSPID VALVE MV Area (PHT): 4.65 cm     TR Peak grad:   22.8 mmHg MV Area VTI:   2.59 cm     TR Vmax:        239.00 cm/s MV Peak grad:  6.2 mmHg MV Mean grad:  2.0 mmHg     SHUNTS MV Vmax:       1.24 m/s     Systemic VTI:  0.28 m MV Vmean:      63.8 cm/s    Systemic Diam: 2.00 cm MV Decel Time: 163 msec MV E velocity: 110.00 cm/s MV A velocity: 66.80 cm/s MV E/A ratio:  1.65 Rozann Lesches MD Electronically signed by Rozann Lesches MD Signature Date/Time: 03/11/2022/10:24:09 AM    Final    DG Chest 2 View  Result Date: 03/11/2022 CLINICAL DATA:  Shortness of breath and cough.  COPD/smoker. EXAM: CHEST - 2 VIEW COMPARISON:  Chest x-ray 03/10/2022. FINDINGS: Mild improvement in lung aeration. Persistent, but improved interstitial airspace opacities. No visible pleural effusions or pneumothorax. Cardia style silhouette is within normal limits and unchanged. Polyarticular degenerative change. Right upper quadrant clips. IMPRESSION: Mild improvement in lung aeration. Persistent, but improved interstitial airspace opacities could represent edema and/or infection. Electronically Signed   By: Margaretha Sheffield M.D.   On: 03/11/2022 08:26   DG Chest 2 View  Result Date: 03/10/2022 CLINICAL DATA:  Shortness of breath,  cough EXAM: CHEST - 2 VIEW COMPARISON:  03/09/2022 FINDINGS: No significant change in examination with diffuse bilateral interstitial and heterogeneous airspace opacity superimposed upon emphysema. Heart and mediastinum are normal. Osseous structures unremarkable. IMPRESSION: No significant change in examination with diffuse bilateral interstitial and heterogeneous airspace opacity superimposed upon emphysema.  Findings are consistent with edema or infection. No new airspace opacity. Electronically Signed   By: Delanna Ahmadi M.D.   On: 03/10/2022 08:45   CT Angio Chest Pulmonary Embolism (PE) W or WO Contrast  Result Date: 03/09/2022 CLINICAL DATA:  Left-sided chest pain and worsening shortness of breath EXAM: CT ANGIOGRAPHY CHEST WITH CONTRAST TECHNIQUE: Multidetector CT imaging of the chest was performed using the standard protocol during bolus administration of intravenous contrast. Multiplanar CT image reconstructions and MIPs were obtained to evaluate the vascular anatomy. RADIATION DOSE REDUCTION: This exam was performed according to the departmental dose-optimization program which includes automated exposure control, adjustment of the mA and/or kV according to patient size and/or use of iterative reconstruction technique. CONTRAST:  51m OMNIPAQUE IOHEXOL 350 MG/ML SOLN COMPARISON:  08/25/2021 CT, chest x-ray from earlier in the same day. FINDINGS: Cardiovascular: Thoracic aorta and its branches demonstrate atherosclerotic calcification. No aneurysmal dilatation or dissection is noted. Heart is not significantly enlarged in size. The pulmonary artery is well visualized within normal branching pattern. No definitive filling defect to suggest pulmonary embolism is noted. Mediastinum/Nodes: Thoracic inlet is within normal limits. No hilar or mediastinal adenopathy is noted. Moderate to large-sized hiatal hernia is noted. The remainder of the esophagus appears within normal limits. Lungs/Pleura: Significant  progressive airspace opacity is noted throughout both lungs consistent with multifocal pneumonia. Small bilateral pleural effusions are seen. No sizable parenchymal nodules are noted. No cavitary lesions are seen. Upper Abdomen: Visualized upper abdomen is otherwise within normal limits aside from the hiatal hernia. Musculoskeletal: No acute rib abnormality is noted. Mild degenerative changes of the thoracic spine are seen. Chronic T12 and L1 compression deformities are noted. The L1 fracture is new from the prior CT. Review of the MIP images confirms the above findings. IMPRESSION: No evidence of pulmonary emboli. Moderate to large hiatal hernia. Significant airspace opacities throughout both lung similar to that seen on prior chest x-ray consistent with acute multifocal pneumonia. Small bilateral pleural effusions are noted as well. Aortic Atherosclerosis (ICD10-I70.0). Electronically Signed   By: MInez CatalinaM.D.   On: 03/09/2022 21:08   DG CHEST PORT 1 VIEW  Result Date: 03/09/2022 CLINICAL DATA:  Hypoxia EXAM: PORTABLE CHEST 1 VIEW COMPARISON:  Abdominal series 03/09/2022.  Chest CT 08/25/2021 FINDINGS: Diffuse reticulonodular opacities are seen throughout both lungs. Superimposed patchy airspace disease is seen in the bilateral mid lungs and right upper lobe, right greater than left. Costophrenic angles are clear. No pneumothorax. Cardiomediastinal silhouette is within normal limits. Large hiatal hernia again noted. No acute fractures. IMPRESSION: 1. Diffuse reticulonodular opacities throughout both lungs may be infectious or inflammatory. 2. Superimposed bilateral airspace disease, right greater than left. Electronically Signed   By: ARonney AstersM.D.   On: 03/09/2022 20:46     Scheduled Meds:  amitriptyline  100 mg Oral QHS   atorvastatin  20 mg Oral Daily   busPIRone  10 mg Oral TID   Chlorhexidine Gluconate Cloth  6 each Topical Daily   citalopram  40 mg Oral Daily    dextromethorphan-guaiFENesin  1 tablet Oral BID   docusate sodium  50 mg Oral BID   fluticasone furoate-vilanterol  1 puff Inhalation Daily   furosemide  40 mg Intravenous Q12H   gabapentin  300 mg Oral TID   insulin aspart  0-5 Units Subcutaneous QHS   insulin aspart  0-9 Units Subcutaneous TID WC   levothyroxine  25 mcg Oral Q0600   methocarbamol  500 mg Oral TID  methylPREDNISolone (SOLU-MEDROL) injection  40 mg Intravenous Q12H   pantoprazole (PROTONIX) IV  40 mg Intravenous Q12H   pneumococcal 20-valent conjugate vaccine  0.5 mL Intramuscular Tomorrow-1000   [START ON 03/12/2022] polyethylene glycol  17 g Oral Daily   tamsulosin  0.4 mg Oral Daily   umeclidinium bromide  1 puff Inhalation Daily   Continuous Infusions:  sodium chloride 10 mL/hr at 03/09/22 1122   azithromycin 500 mg (03/11/22 0411)   cefTRIAXone (ROCEPHIN)  IV 2 g (03/10/22 2131)   magnesium sulfate bolus IVPB      LOS: 5 days   Barton Dubois M.D on 03/11/2022 at 10:54 AM  Go to [email or web address] - for contact info  [facility] - Office  [phone]  If 7PM-7AM, please contact night-coverage [email or web address] 03/11/2022, 10:54 AM

## 2022-03-11 NOTE — Plan of Care (Signed)

## 2022-03-12 DIAGNOSIS — A419 Sepsis, unspecified organism: Secondary | ICD-10-CM | POA: Diagnosis not present

## 2022-03-12 DIAGNOSIS — K921 Melena: Secondary | ICD-10-CM | POA: Diagnosis not present

## 2022-03-12 DIAGNOSIS — N179 Acute kidney failure, unspecified: Secondary | ICD-10-CM | POA: Diagnosis not present

## 2022-03-12 DIAGNOSIS — D62 Acute posthemorrhagic anemia: Secondary | ICD-10-CM | POA: Diagnosis not present

## 2022-03-12 LAB — BASIC METABOLIC PANEL
Anion gap: 7 (ref 5–15)
BUN: 11 mg/dL (ref 6–20)
CO2: 33 mmol/L — ABNORMAL HIGH (ref 22–32)
Calcium: 8.7 mg/dL — ABNORMAL LOW (ref 8.9–10.3)
Chloride: 99 mmol/L (ref 98–111)
Creatinine, Ser: 0.53 mg/dL (ref 0.44–1.00)
GFR, Estimated: >60 mL/min (ref >60)
Glucose, Bld: 157 mg/dL — ABNORMAL HIGH (ref 70–99)
Potassium: 3.6 mmol/L (ref 3.5–5.1)
Sodium: 139 mmol/L (ref 135–145)

## 2022-03-12 LAB — CBC
HCT: 28.2 % — ABNORMAL LOW (ref 36.0–46.0)
Hemoglobin: 9 g/dL — ABNORMAL LOW (ref 12.0–15.0)
MCH: 28 pg (ref 26.0–34.0)
MCHC: 31.9 g/dL (ref 30.0–36.0)
MCV: 87.9 fL (ref 80.0–100.0)
Platelets: 256 10*3/uL (ref 150–400)
RBC: 3.21 MIL/uL — ABNORMAL LOW (ref 3.87–5.11)
RDW: 21.2 % — ABNORMAL HIGH (ref 11.5–15.5)
WBC: 8.5 10*3/uL (ref 4.0–10.5)
nRBC: 0.2 % (ref 0.0–0.2)

## 2022-03-12 LAB — GLUCOSE, CAPILLARY
Glucose-Capillary: 113 mg/dL — ABNORMAL HIGH (ref 70–99)
Glucose-Capillary: 117 mg/dL — ABNORMAL HIGH (ref 70–99)
Glucose-Capillary: 141 mg/dL — ABNORMAL HIGH (ref 70–99)
Glucose-Capillary: 132 mg/dL — ABNORMAL HIGH (ref 70–99)

## 2022-03-12 NOTE — Progress Notes (Signed)
PROGRESS NOTE     [name], is a 51 y.o. female, DOB - [birth year], QAS:[id number]  Admit date - 03/06/2022   Admitting Physician No admitting provider for patient encounter.  Outpatient Primary MD for the patient is Redmond School, MD  LOS - 6  Chief Complaint  Patient presents with   Back Pain      Brief Narrative:  51 y.o. female with medical history significant of type 2 diabetes, hypothyroidism, COPD/asthma, hyperlipidemia, chronic pancreatitis admitted on 03/06/2022 with concerns for acute GI bleed and possible ileus versus partial small bowel obstruction as well as sepsis from pneumonia probably aspiration related    -Assessment and Plan:  1)Sepsis with Septic shock-due to aspiration pneumonia/CAP--POA -Patient met sepsis shock criteria on admission -Continue to weaned off oxygen supplementation as tolerated -Currently not requiring pressor support. -Leukocytosis resolved -PCT 8.58 -Continue current IV antibiotics -Follow culture results; continue flutter valve/incentive spirometry. -Continue bronchodilators. -Patient is feeling much better. -will transfer to med-surg bed.  2)Acute acute hypoxic respiratory failure---  --Repeat x-rays on 03/10/2022 with concern for worsening pneumonia Versus CHF Troponin 19,  repeat 17 -Continue treatment with antibiotics as mentioned in problem 1 -Continue IV Lasix -Continue to wean off oxygen supplementation as tolerated -Continue to follow recommendations by PCCM. -Continue the use of BiPAP as needed  3)Acute on chronic Anemia--- heme positive stool and hematemesis -Events work-up was consistent with iron and B12 deficiency Hgb 11.8 >>8.7>>8.6>.9.9  -GI consult appreciated -Continue IV Protonix -Hgb 9.0 and stable. -Planning for possible EGD when respiratory status improved -Continue B12 supplementation. -Patient gets outpatient iron infusion she can continue this postdischarge.  4)Ileus Versus partial SBO----General  surgery consult appreciated --multiple BMs with miralax, lactulose and Dulcolax suppository. -Diet will be advanced -No surgical needs currently. -Continue increasing activity -Follow electrolytes replete and further replete as needed.  5)AKI----acute kidney injury with hyperkalemia and metabolic acidosis -creatinine on admission= 2.77 , -baseline creatinine = 0.7 to 0.9   -Creatinine currently 0.53 -creatinine has now normalized, close monitoring will be followed as patient in need of diuretics. -- Continue renally adjusting medications, avoid nephrotoxic agents / dehydration  / hypotension  6)DM2--- A1c 5.9 reflecting excellent diabetic control PTA -Continue holding metformin while inpatient. -Continue the use of sliding scale insulin -Continue to follow CBGs and adjust hypoglycemic regimen as needed.   6)HTN-- -Continue to hold antihypertensives -Patient has been weaned off Levophed completely -Follow vital signs. -Continue heart healthy/low-sodium diet discussed with patient.  8) hypomagnesemia- --- in the setting of vomiting and GI losses -Continue to follow electrolytes and further replete as needed.  9) urinary retention and possible UTI -Discontinued Foley catheter that was placed in the ED -c/n Flomax   -Patient is voiding okay -Urine culture NGTD -Continue to maintain adequate hydration and follow response.  10) anxiety and depression---  -stable overall -continue Celexa, buspirone  11)Hypothyroidism--- TSH WNL at 1.97 -Continue levothyroxine -Thyroid panel follow-up as an outpatient in the next  12) subacute back pain--- patient has had back pain for the last couple weeks  -be judicious with opiates due to concerns about ileus/SBO -She was scheduled for outpatient work-up by her neurosurgeon apparently in [place] with neurosurgeon/orthopedic surgeon as outpatient for MRI and further evaluation/treatment.  Disposition/Need for in-Hospital Stay-  patient unable to be discharged at this time due to *sepsis requiring IV fluids and the use of BIPAP for rsp distress.  Status is: Inpatient   Disposition: The patient is from: Home  Anticipated d/c is to: Home              Anticipated d/c date is: 1-2 days              Patient currently is not medically stable to d/c.  Barriers: Not Clinically Stable-   Code Status : -  Code Status: Full Code   Family Communication:   NA (patient is alert, awake and coherent)   DVT Prophylaxis  :   - SCDs   Place and maintain sequential compression device Start: 03/09/22 2155 Place and maintain sequential compression device Start: 03/07/22 0441   Lab Results  Component Value Date   PLT 256 03/12/2022   Inpatient Medications  Scheduled Meds:  amitriptyline  100 mg Oral QHS   atorvastatin  20 mg Oral Daily   busPIRone  10 mg Oral TID   Chlorhexidine Gluconate Cloth  6 each Topical Daily   citalopram  40 mg Oral Daily   dextromethorphan-guaiFENesin  1 tablet Oral BID   docusate sodium  100 mg Oral Daily   fluticasone furoate-vilanterol  1 puff Inhalation Daily   furosemide  40 mg Intravenous Q12H   gabapentin  300 mg Oral TID   insulin aspart  0-5 Units Subcutaneous QHS   insulin aspart  0-9 Units Subcutaneous TID WC   levothyroxine  25 mcg Oral Q0600   methocarbamol  500 mg Oral TID   methylPREDNISolone (SOLU-MEDROL) injection  40 mg Intravenous Q12H   pantoprazole (PROTONIX) IV  40 mg Intravenous Q12H   pneumococcal 20-valent conjugate vaccine  0.5 mL Intramuscular Tomorrow-1000   polyethylene glycol  17 g Oral Daily   tamsulosin  0.4 mg Oral Daily   umeclidinium bromide  1 puff Inhalation Daily   Continuous Infusions:  sodium chloride 10 mL/hr at 03/11/22 1750   azithromycin 500 mg (03/12/22 0451)   cefTRIAXone (ROCEPHIN)  IV 2 g (03/11/22 2109)   PRN Meds:.albuterol, fentaNYL (SUBLIMAZE) injection, ondansetron **OR** ondansetron (ZOFRAN) IV, oxyCODONE, sodium  chloride   Anti-infectives (From admission, onward)    Start     Dose/Rate Route Frequency Ordered Stop   03/09/22 2200  cefTRIAXone (ROCEPHIN) 2 g in sodium chloride 0.9 % 100 mL IVPB        2 g 200 mL/hr over 30 Minutes Intravenous Every 24 hours 03/09/22 1808     03/08/22 1600  vancomycin (VANCOREADY) IVPB 750 mg/150 mL  Status:  Discontinued        750 mg 150 mL/hr over 60 Minutes Intravenous Every 48 hours 03/06/22 1602 03/08/22 0812   03/08/22 1400  ceFEPIme (MAXIPIME) 2 g in sodium chloride 0.9 % 100 mL IVPB  Status:  Discontinued        2 g 200 mL/hr over 30 Minutes Intravenous Every 8 hours 03/08/22 0808 03/09/22 1808   03/07/22 0440  azithromycin (ZITHROMAX) 500 mg in sodium chloride 0.9 % 250 mL IVPB        500 mg 250 mL/hr over 60 Minutes Intravenous Every 24 hours 03/07/22 0440     03/06/22 1615  ceFEPIme (MAXIPIME) 2 g in sodium chloride 0.9 % 100 mL IVPB  Status:  Discontinued        2 g 200 mL/hr over 30 Minutes Intravenous Every 24 hours 03/06/22 1600 03/08/22 0808   03/06/22 1615  vancomycin (VANCOCIN) IVPB 1000 mg/200 mL premix        1,000 mg 200 mL/hr over 60 Minutes Intravenous  Once 03/06/22 1601 03/06/22 1906  Subjective: [name] reports feeling better today; still requiring high amount of oxygen supplementation.  No fever, no nausea, no vomiting, reports moving bowels.  Diet will be advanced; patient expressed no chest pain.  Continue IV diuresis and the use of as needed/nightly BiPAP.  Continue antibiotic for pneumonia.  Objective: Vitals:   03/12/22 0454 03/12/22 0700 03/12/22 0723 03/12/22 0810  BP:  (!) 160/76    Pulse:  69 71   Resp:  12 17   Temp:   98.7 F (37.1 C)   TempSrc:   Oral   SpO2:  99% 99% 96%  Weight: 53 kg     Height:        Intake/Output Summary (Last 24 hours) at 03/12/2022 0846 Last data filed at 03/12/2022 0200 Gross per 24 hour  Intake 400.33 ml  Output 1600 ml  Net -1199.67 ml   Filed Weights   03/10/22  0431 03/11/22 0500 03/12/22 0454  Weight: 58.6 kg 55.7 kg 53 kg   Physical Exam General exam: Alert, awake, oriented x 3; reports feeling better and has tolerated the use of BiPAP overnight.  No chest pain, no nausea, no vomiting.  Continue 2 sprays good urine output.  Improvement appreciated on oxygen saturation.  No fever. Respiratory system: Scattered rhonchi and decreased breath sounds at the bases appreciated on exam; no using accessory muscles.  Good saturation on current supplementation. Cardiovascular system: Rate controlled, no rubs or gallops appreciated on exam; no JVD. Gastrointestinal system: Abdomen is nondistended, soft and nontender. No organomegaly or masses felt. Normal bowel sounds heard. Central nervous system: Alert and oriented. No focal neurological deficits. Extremities: No cyanosis or clubbing. Skin: No petechiae. Psychiatry: Judgement and insight appear normal. Mood & affect appropriate.   Data Reviewed: I have personally reviewed following labs and imaging studies  CBC: Recent Labs  Lab 03/06/22 1432 03/06/22 1754 03/08/22 0356 03/09/22 0515 03/09/22 2113 03/11/22 0406 03/12/22 0447  WBC 14.2*   < > 8.6 9.1 10.0 8.1 8.5  NEUTROABS 11.6*  --   --   --   --   --   --   HGB 11.8*   < > 8.6* 8.5* 9.9* 9.2* 9.0*  HCT 37.8   < > 28.0* 29.1* 32.3* 28.5* 28.2*  MCV 90.6   < > 92.4 93.9 92.3 86.9 87.9  PLT 322   < > 193 208 208 198 256   < > = values in this interval not displayed.   Basic Metabolic Panel: Recent Labs  Lab 03/07/22 0435 03/08/22 0356 03/09/22 2113 03/10/22 0358 03/11/22 0406 03/12/22 0447  NA 139 137 140  --  140 139  K 4.8 4.5 3.6  --  3.7 3.6  CL 112* 112* 110  --  102 99  CO2 22 22 18*  --  29 33*  GLUCOSE 133* 156* 138*  --  173* 157*  BUN 34* 16 12  --  9 11  CREATININE 1.11* 0.60 0.79  --  0.53 0.53  CALCIUM 7.5* 8.2* 8.5*  --  8.5* 8.7*  MG 1.4*  --   --  1.1*  --   --   PHOS 3.5  --   --   --  4.6  --    GFR: Estimated  Creatinine Clearance: 64.3 mL/min (by C-G formula based on SCr of 0.53 mg/dL).  Liver Function Tests: Recent Labs  Lab 03/06/22 1617 03/09/22 2113 03/11/22 0406  AST 41 21  --   ALT 27 15  --  ALKPHOS 97 70  --   BILITOT 0.5 <0.1*  --   PROT 7.4 6.2*  --   ALBUMIN 3.6 2.8* 2.5*   HbA1C: No results for input(s): "HGBA1C" in the last 72 hours.  Recent Results (from the past 240 hour(s))  Culture, blood (Routine x 2)     Status: None   Collection Time: 03/06/22  2:53 PM   Specimen: BLOOD RIGHT ARM  Result Value Ref Range Status   Specimen Description   Final    BLOOD RIGHT ARM BOTTLES DRAWN AEROBIC AND ANAEROBIC   Special Requests Blood Culture adequate volume  Final   Culture   Final    NO GROWTH 5 DAYS Performed at [facility], [address] [place], [place] [address]    Report Status 03/11/2022 FINAL  Final  Culture, blood (Routine x 2)     Status: Abnormal   Collection Time: 03/06/22  4:35 PM   Specimen: Left Antecubital; Blood  Result Value Ref Range Status   Specimen Description   Final    LEFT ANTECUBITAL BOTTLES DRAWN AEROBIC AND ANAEROBIC Performed at [facility], [address], [place], [place] [address]    Special Requests   Final    Blood Culture adequate volume Performed at [facility], [address] [place], [place] [address]    Culture  Setup Time   Final    GRAM POSITIVE RODS AEROBIC BOTTLE ONLY Gram Stain Report Called to,Read Back By and Verified With: Morton Peters 2150 03/07/22 BY GMCGEEHON WORK DONE AT [facility] GRAM STAIN REVIEWED-AGREE WITH RESULT    Culture (A)  Final    BACILLUS SPECIES NOT ANTHRACIS Standardized susceptibility testing for this organism is not available. Performed at [facility], [place] [address], [place], [place] [address]    Report Status 03/10/2022 FINAL  Final  Urine Culture     Status: None   Collection Time: 03/06/22  6:38 PM   Specimen: In/Out Cath Urine  Result Value Ref Range Status   Specimen Description    Final    IN/OUT CATH URINE Performed at [facility], [address], [place], [place] [address]    Special Requests   Final    NONE Performed at [facility], [address] [place], [place] [address]    Culture   Final    NO GROWTH Performed at [facility], [place] [address], [place], [place] [address]    Report Status 03/08/2022 FINAL  Final  Resp Panel by RT-PCR (Flu A&B, Covid) Anterior Nasal Swab     Status: None   Collection Time: 03/06/22  9:30 PM   Specimen: Anterior Nasal Swab  Result Value Ref Range Status   SARS Coronavirus 2 by RT PCR NEGATIVE NEGATIVE Final    Comment: (NOTE) SARS-CoV-2 target nucleic acids are NOT DETECTED.  The SARS-CoV-2 RNA is generally detectable in upper respiratory specimens during the acute phase of infection. The lowest concentration of SARS-CoV-2 viral copies this assay can detect is 138 copies/mL. A negative result does not preclude SARS-Cov-2 infection and should not be used as the sole basis for treatment or other patient management decisions. A negative result may occur with  improper specimen collection/handling, submission of specimen other than nasopharyngeal swab, presence of viral mutation(s) within the areas targeted by this assay, and inadequate number of viral copies(<138 copies/mL). A negative result must be combined with clinical observations, patient history, and epidemiological information. The expected result is Negative.  Fact Sheet for Patients:  EntrepreneurPulse.com.au  Fact Sheet for Healthcare Providers:  IncredibleEmployment.be  This test is no t yet approved or cleared by the [place] FDA and  has been authorized for detection and/or diagnosis of SARS-CoV-2 by FDA under an Emergency Use Authorization (EUA). This EUA will remain  in effect (meaning this test can be used) for the duration of the COVID-19 declaration under Section 564(b)(1) of the Act,  21 U.S.C.section 360bbb-3(b)(1), unless the authorization is terminated  or revoked sooner.       Influenza A by PCR NEGATIVE NEGATIVE Final   Influenza B by PCR NEGATIVE NEGATIVE Final    Comment: (NOTE) The Xpert Xpress SARS-CoV-2/FLU/RSV plus assay is intended as an aid in the diagnosis of influenza from Nasopharyngeal swab specimens and should not be used as a sole basis for treatment. Nasal washings and aspirates are unacceptable for Xpert Xpress SARS-CoV-2/FLU/RSV testing.  Fact Sheet for Patients: EntrepreneurPulse.com.au  Fact Sheet for Healthcare Providers: IncredibleEmployment.be  This test is not yet approved or cleared by the [place] FDA and has been authorized for detection and/or diagnosis of SARS-CoV-2 by FDA under an Emergency Use Authorization (EUA). This EUA will remain in effect (meaning this test can be used) for the duration of the COVID-19 declaration under Section 564(b)(1) of the Act, 21 U.S.C. section 360bbb-3(b)(1), unless the authorization is terminated or revoked.  Performed at [facility], [address], [place], [place] [address]   MRSA Next Gen by PCR, Nasal     Status: None   Collection Time: 03/07/22  3:42 PM   Specimen: Nasal Mucosa; Nasal Swab  Result Value Ref Range Status   MRSA by PCR Next Gen NOT DETECTED NOT DETECTED Final    Comment: (NOTE) The GeneXpert MRSA Assay (FDA approved for NASAL specimens only), is one component of a comprehensive MRSA colonization surveillance program. It is not intended to diagnose MRSA infection nor to guide or monitor treatment for MRSA infections. Test performance is not FDA approved in patients less than 43 years old. Performed at [facility], [address] [place], [place] [address]     Radiology Studies: ECHOCARDIOGRAM COMPLETE  Result Date: 03/11/2022    ECHOCARDIOGRAM REPORT   Patient Name:   EMELYNN RANCE Date of Exam: 03/11/2022 Medical Rec #:   [id number]         Height:       62.0 in Accession #:    [id number]        Weight:       122.8 lb Date of Birth:  February 15, 1971         BSA:          1.554 m Patient Age:    59 years          BP:           164/81 mmHg Patient Gender: F                 HR:           71 bpm. Exam Location:  [facility] Procedure: 2D Echo, Cardiac Doppler and Color Doppler Indications:    Dyspnea  History:        Patient has prior history of Echocardiogram examinations, most                 recent 12/15/2016. COPD; Risk Factors:Hypertension, Diabetes,                 Dyslipidemia and Current Smoker.  Sonographer:  Wenda Low Referring Phys: [place]  1. Left ventricular ejection fraction, by estimation, is 60 to 65%. The left ventricle has normal function. The left ventricle has no regional wall motion abnormalities. Left ventricular diastolic parameters were normal.  2. Right ventricular systolic function is normal. The right ventricular size is normal. There is normal pulmonary artery systolic pressure. The estimated right ventricular systolic pressure is 37.3 mmHg.  3. Left atrial size was upper normal.  4. The mitral valve is grossly normal. Mild mitral valve regurgitation.  5. The aortic valve is tricuspid. Aortic valve regurgitation is not visualized. Aortic valve mean gradient measures 3.0 mmHg.  6. The inferior vena cava is normal in size with greater than 50% respiratory variability, suggesting right atrial pressure of 3 mmHg. Comparison(s): Prior images unable to be directly viewed. FINDINGS  Left Ventricle: Left ventricular ejection fraction, by estimation, is 60 to 65%. The left ventricle has normal function. The left ventricle has no regional wall motion abnormalities. The left ventricular internal cavity size was normal in size. There is  borderline left ventricular hypertrophy. Left ventricular diastolic parameters were normal. Right Ventricle: The right ventricular size is normal. No increase  in right ventricular wall thickness. Right ventricular systolic function is normal. There is normal pulmonary artery systolic pressure. The tricuspid regurgitant velocity is 2.39 m/s, and  with an assumed right atrial pressure of 3 mmHg, the estimated right ventricular systolic pressure is 42.8 mmHg. Left Atrium: Left atrial size was upper normal. Right Atrium: Right atrial size was normal in size. Pericardium: There is no evidence of pericardial effusion. Mitral Valve: The mitral valve is grossly normal. Mild mitral valve regurgitation. MV peak gradient, 6.2 mmHg. The mean mitral valve gradient is 2.0 mmHg. Tricuspid Valve: The tricuspid valve is grossly normal. Tricuspid valve regurgitation is trivial. Aortic Valve: The aortic valve is tricuspid. There is mild aortic valve annular calcification. Aortic valve regurgitation is not visualized. Aortic valve mean gradient measures 3.0 mmHg. Aortic valve peak gradient measures 8.6 mmHg. Aortic valve area, by  VTI measures 2.98 cm. Pulmonic Valve: The pulmonic valve was grossly normal. Pulmonic valve regurgitation is trivial. Aorta: The aortic root is normal in size and structure. Venous: The inferior vena cava is normal in size with greater than 50% respiratory variability, suggesting right atrial pressure of 3 mmHg. IAS/Shunts: No atrial level shunt detected by color flow Doppler.  LEFT VENTRICLE PLAX 2D LVIDd:         4.20 cm     Diastology LVIDs:         2.60 cm     LV e' medial:    11.10 cm/s LV PW:         1.00 cm     LV E/e' medial:  9.9 LV IVS:        0.90 cm     LV e' lateral:   10.30 cm/s LVOT diam:     2.00 cm     LV E/e' lateral: 10.7 LV SV:         87 LV SV Index:   56 LVOT Area:     3.14 cm  LV Volumes (MOD) LV vol d, MOD A2C: 34.0 ml LV vol d, MOD A4C: 48.8 ml LV vol s, MOD A2C: 15.3 ml LV vol s, MOD A4C: 18.6 ml LV SV MOD A2C:     18.7 ml LV SV MOD A4C:     48.8 ml LV SV MOD BP:  25.3 ml RIGHT VENTRICLE RV Basal diam:  3.15 cm RV Mid diam:    2.50  cm RV S prime:     13.40 cm/s TAPSE (M-mode): 3.3 cm LEFT ATRIUM             Index        RIGHT ATRIUM           Index LA diam:        3.40 cm 2.19 cm/m   RA Area:     11.20 cm LA Vol (A2C):   66.9 ml 43.06 ml/m  RA Volume:   22.70 ml  14.61 ml/m LA Vol (A4C):   31.4 ml 20.21 ml/m LA Biplane Vol: 47.0 ml 30.25 ml/m  AORTIC VALVE                    PULMONIC VALVE AV Area (Vmax):    2.48 cm     PV Vmax:       1.08 m/s AV Area (Vmean):   2.82 cm     PV Peak grad:  4.7 mmHg AV Area (VTI):     2.98 cm AV Vmax:           147.00 cm/s AV Vmean:          75.400 cm/s AV VTI:            0.291 m AV Peak Grad:      8.6 mmHg AV Mean Grad:      3.0 mmHg LVOT Vmax:         116.00 cm/s LVOT Vmean:        67.800 cm/s LVOT VTI:          0.276 m LVOT/AV VTI ratio: 0.95  AORTA Ao Root diam: 2.80 cm MITRAL VALVE                TRICUSPID VALVE MV Area (PHT): 4.65 cm     TR Peak grad:   22.8 mmHg MV Area VTI:   2.59 cm     TR Vmax:        239.00 cm/s MV Peak grad:  6.2 mmHg MV Mean grad:  2.0 mmHg     SHUNTS MV Vmax:       1.24 m/s     Systemic VTI:  0.28 m MV Vmean:      63.8 cm/s    Systemic Diam: 2.00 cm MV Decel Time: 163 msec MV E velocity: 110.00 cm/s MV A velocity: 66.80 cm/s MV E/A ratio:  1.65 Rozann Lesches MD Electronically signed by Rozann Lesches MD Signature Date/Time: 03/11/2022/10:24:09 AM    Final    DG Chest 2 View  Result Date: 03/11/2022 CLINICAL DATA:  Shortness of breath and cough.  COPD/smoker. EXAM: CHEST - 2 VIEW COMPARISON:  Chest x-ray 03/10/2022. FINDINGS: Mild improvement in lung aeration. Persistent, but improved interstitial airspace opacities. No visible pleural effusions or pneumothorax. Cardia style silhouette is within normal limits and unchanged. Polyarticular degenerative change. Right upper quadrant clips. IMPRESSION: Mild improvement in lung aeration. Persistent, but improved interstitial airspace opacities could represent edema and/or infection. Electronically Signed   By: Margaretha Sheffield M.D.   On: 03/11/2022 08:26     Scheduled Meds:  amitriptyline  100 mg Oral QHS   atorvastatin  20 mg Oral Daily   busPIRone  10 mg Oral TID   Chlorhexidine Gluconate Cloth  6 each Topical Daily   citalopram  40 mg Oral Daily   dextromethorphan-guaiFENesin  1 tablet Oral BID   docusate sodium  100 mg Oral Daily   fluticasone furoate-vilanterol  1 puff Inhalation Daily   furosemide  40 mg Intravenous Q12H   gabapentin  300 mg Oral TID   insulin aspart  0-5 Units Subcutaneous QHS   insulin aspart  0-9 Units Subcutaneous TID WC   levothyroxine  25 mcg Oral Q0600   methocarbamol  500 mg Oral TID   methylPREDNISolone (SOLU-MEDROL) injection  40 mg Intravenous Q12H   pantoprazole (PROTONIX) IV  40 mg Intravenous Q12H   pneumococcal 20-valent conjugate vaccine  0.5 mL Intramuscular Tomorrow-1000   polyethylene glycol  17 g Oral Daily   tamsulosin  0.4 mg Oral Daily   umeclidinium bromide  1 puff Inhalation Daily   Continuous Infusions:  sodium chloride 10 mL/hr at 03/11/22 1750   azithromycin 500 mg (03/12/22 0451)   cefTRIAXone (ROCEPHIN)  IV 2 g (03/11/22 2109)    LOS: 6 days   Barton Dubois M.D on 03/12/2022 at 8:46 AM  Go to [email or web address] - for contact info  [facility] - Office  [phone]  If 7PM-7AM, please contact night-coverage [email or web address] 03/12/2022, 8:46 AM

## 2022-03-12 NOTE — Progress Notes (Signed)
Patient placed on BIPAP for tonight RT will continue to monitor

## 2022-03-12 NOTE — Progress Notes (Signed)
Pt off BIPAP and back on [place]

## 2022-03-13 DIAGNOSIS — N179 Acute kidney failure, unspecified: Secondary | ICD-10-CM | POA: Diagnosis not present

## 2022-03-13 DIAGNOSIS — K56609 Unspecified intestinal obstruction, unspecified as to partial versus complete obstruction: Secondary | ICD-10-CM

## 2022-03-13 DIAGNOSIS — A419 Sepsis, unspecified organism: Secondary | ICD-10-CM | POA: Diagnosis not present

## 2022-03-13 DIAGNOSIS — K921 Melena: Secondary | ICD-10-CM | POA: Diagnosis not present

## 2022-03-13 DIAGNOSIS — E119 Type 2 diabetes mellitus without complications: Secondary | ICD-10-CM

## 2022-03-13 DIAGNOSIS — D62 Acute posthemorrhagic anemia: Secondary | ICD-10-CM | POA: Diagnosis not present

## 2022-03-13 LAB — GLUCOSE, CAPILLARY
Glucose-Capillary: 122 mg/dL — ABNORMAL HIGH (ref 70–99)
Glucose-Capillary: 150 mg/dL — ABNORMAL HIGH (ref 70–99)

## 2022-03-13 LAB — CBC
HCT: 34.6 % — ABNORMAL LOW (ref 36.0–46.0)
Hemoglobin: 11 g/dL — ABNORMAL LOW (ref 12.0–15.0)
MCH: 27.9 pg (ref 26.0–34.0)
MCHC: 31.8 g/dL (ref 30.0–36.0)
MCV: 87.8 fL (ref 80.0–100.0)
Platelets: 328 10*3/uL (ref 150–400)
RBC: 3.94 MIL/uL (ref 3.87–5.11)
RDW: 21.4 % — ABNORMAL HIGH (ref 11.5–15.5)
WBC: 9.3 10*3/uL (ref 4.0–10.5)
nRBC: 0.4 % — ABNORMAL HIGH (ref 0.0–0.2)

## 2022-03-13 LAB — BASIC METABOLIC PANEL
Anion gap: 14 (ref 5–15)
BUN: 15 mg/dL (ref 6–20)
CO2: 31 mmol/L (ref 22–32)
Calcium: 9 mg/dL (ref 8.9–10.3)
Chloride: 93 mmol/L — ABNORMAL LOW (ref 98–111)
Creatinine, Ser: 0.71 mg/dL (ref 0.44–1.00)
GFR, Estimated: >60 mL/min (ref >60)
Glucose, Bld: 188 mg/dL — ABNORMAL HIGH (ref 70–99)
Potassium: 3.2 mmol/L — ABNORMAL LOW (ref 3.5–5.1)
Sodium: 138 mmol/L (ref 135–145)

## 2022-03-13 MED ORDER — FUROSEMIDE 20 MG PO TABS: 20.0000 mg | ORAL_TABLET | Freq: Every day | ORAL | 2 refills | Status: DC

## 2022-03-13 MED ORDER — TAMSULOSIN HCL 0.4 MG PO CAPS: 0.4000 mg | ORAL_CAPSULE | Freq: Every day | ORAL | 0 refills | Status: DC

## 2022-03-13 MED ORDER — PREDNISONE 20 MG PO TABS: ORAL_TABLET | ORAL | 0 refills | Status: DC

## 2022-03-13 MED ORDER — DM-GUAIFENESIN ER 30-600 MG PO TB12: 1.0000 | ORAL_TABLET | Freq: Two times a day (BID) | ORAL | 0 refills | Status: AC

## 2022-03-13 MED ORDER — DOXYCYCLINE HYCLATE 100 MG PO TABS: 100.0000 mg | ORAL_TABLET | Freq: Two times a day (BID) | ORAL | 0 refills | Status: AC

## 2022-03-13 NOTE — Progress Notes (Signed)
SATURATION QUALIFICATIONS:  Patient Saturations on Room Air at Rest = 95%  Patient Saturations on Room Air while Ambulating = 95-98%  No need for oxygen at this time.

## 2022-03-13 NOTE — Care Management Important Message (Signed)
Important Message  Patient Details  Name: [name] MRN: [id number] Date of Birth: [birth year]   Medicare Important Message Given:  Yes  Reviewed Medicare IM with patient via room phone [phone]).  Copy of Medicare IM mailed to home address on file.    Dannette Barbara 03/13/2022, 12:24 PM

## 2022-03-13 NOTE — Discharge Summary (Signed)
Physician Discharge Summary   Patient: [name] MRN: [id number] DOB: June 07, 1971  Admit date:     03/06/2022  Discharge date: 03/13/22  Discharge Physician: Barton Dubois   PCP: Redmond School, MD   Recommendations at discharge:  Repeat basic metabolic panel to follow electrolytes and renal function Repeat chest x-ray in 6-8 weeks to assure complete resolution of infiltrates. Repeat CBC to follow hemoglobin trend/stability. Reassess blood pressure and further adjust antihypertensive regimen as needed. Continue to closely follow CBGs/A1c with further adjustment to hypoglycemic regimen as required.  Discharge Diagnoses: Principal Problem:   ABLA (acute blood loss anemia) Active Problems:   Essential hypertension   HEMATOCHEZIA   Generalized abdominal pain   DM type 2 (diabetes mellitus, type 2) (HCC)   Septic shock (HCC)   Hypothyroid   Multifocal pneumonia   Hyperkalemia   SBO (small bowel obstruction) (HCC)   Metabolic acidosis   AKI (acute kidney injury) ([place])   Sepsis ([place])  Brief Hospital admission course: As per H&P written by Dr. Nehemiah Settle on 03/06/2022 [name] is a 51 y.o. female with medical history significant of type 2 diabetes, hypothyroidism, COPD/asthma, hyperlipidemia, chronic pancreatitis who presents with generalized body aches, upper abdominal pain.  The patient was recently seen in the emergency department approximately 2 weeks ago and was diagnosed with a L1 compression fracture and discharged home with a TLSO brace and pain medicine.  Her pain has been fairly well controlled until this morning when she started to have diffuse body aches, nausea, hematemesis.  Due to worsening symptoms, the patient came to the hospital for evaluation.  The patient feels mild shortness of breath.  She has been having severe constipation and states that she has not had a bowel movement since she had the compression fracture.  She also states that she is having a hard  time urinating.   In the emergency department she was found to be hypotensive.  This persisted despite IV fluid boluses.  She was started on Levophed with improvement on minimal amounts.  She was started on broad-spectrum antibiotics and got blood cultures.  Assessment and Plan: 1)Sepsis with Septic shock-due to aspiration pneumonia/CAP--POA -Patient met sepsis shock criteria on admission. -Oxygen supplementation completely weaned off to room air; no requiring pressors and feeling ready to go home. -Leukocytosis resolved -PCT 18.27>>>8.58 -Complete antibiotics orally as instructed. -No growth appreciated on cultures -Continue bronchodilators and steroid tapering.Marland Kitchen   2)Acute acute hypoxic respiratory failure/acute on chronic diastolic heart failure.  ---  --Repeat x-rays on 03/10/2022 with concern for worsening pneumonia Versus CHF Troponin 19,  repeat 17. -Component of vascular congestion and acute on chronic diastolic heart failure playing a role in her respiratory distress. -Now compensated at time of discharge -No crackles, no requiring oxygen supplementation and euvolemic. -Patient discharged home on daily 20 mg Lasix as previously prescribed. -Instructed to check her weight on daily basis and to follow a low-sodium diet. -Continue treatment for pneumonia as mentioned above.   3)Acute on chronic Anemia--- heme positive stool and hematemesis -Events work-up was consistent with iron and B12 deficiency Hgb 11.8 >>8.7>>8.6>.9.9  -GI consult appreciated -Continue IV Protonix -Hgb 9.0 and stable. -No operative bleeding appreciated. -Planning for possible EGD when respiratory status improved -Continue B12 supplementation. -Patient will continue outpatient iron infusion postdischarge.   4)Ileus Versus partial SBO----General surgery consult appreciated --multiple BMs with miralax, lactulose and Dulcolax suppository. -Diet will be advanced -No surgical intervention needed. -Continue  increasing physical activity. -Continue to follow electrolytes  replete and further replete as needed.   5)AKI----acute kidney injury with hyperkalemia and metabolic acidosis -creatinine on admission= 2.77 , -baseline creatinine = 0.7 to 0.9   -Creatinine currently 0.53 -creatinine has now normalized, close monitoring recommended.   6)DM2--- A1c 5.9 reflecting excellent diabetic control PTA -Continue modified carbohydrate diet and resume the use of metformin at discharge.   6)HTN-- -blood pressure trending up at time of discharge -Patient hemodynamically stable and over 72 hours without the need of pressor support. -We will resume antihypertensive agents at discharge -Patient advised to maintain adequate hydration and to follow heart healthy diet.   8) hypomagnesemia- --- in the setting of vomiting and GI losses -Continue to follow electrolytes and further replete as needed. -WNL at discharge   9) urinary retention and possible UTI -Discontinued Foley catheter that was placed in the ED -c/n Flomax   -Patient is voiding okay -Urine culture NGTD -Continue to maintain adequate hydration and follow response. -Outpatient follow-up with urology service will be recommended if needed.   10) anxiety and depression---  -stable overall -continue Celexa, buspirone -No suicidal ideation or hallucination.   11)Hypothyroidism--- TSH WNL at 1.97 -Continue levothyroxine -Thyroid panel follow-up as an outpatient in the next 8-12 weeks.   12) subacute back pain--- patient has had back pain for the last couple weeks  -be judicious with opiates due to concerns about ileus/SBO -She was scheduled for outpatient work-up by her neurosurgeon apparently in [place] with neurosurgeon/orthopedic surgeon as outpatient for MRI and further evaluation/treatment.  Consultants: Pulmonology, gastroenterology Procedures performed: See below for x-ray reports Disposition: Home Diet recommendation:  Heart healthy/modified carbohydrate diet.   DISCHARGE MEDICATION: Allergies as of 03/13/2022       Reactions   Penicillins Shortness Of Breath    Has patient had a PCN reaction causing immediate rash, facial/tongue/throat swelling, SOB or lightheadedness with hypotension: Yes Has patient had a PCN reaction causing severe rash involving mucus membranes or skin necrosis: Yes Has patient had a PCN reaction that required hospitalization No Has patient had a PCN reaction occurring within the last 10 years: No  If all of the above answers are "NO", then may proceed with Cephalosporin use. Tolerated ceftriaxone   Sulfa Antibiotics Shortness Of Breath   Aspirin Nausea And Vomiting   Enoxaparin Other (See Comments)   Has been known to cause her blood clots in her legs Has been known to cause her blood clots in her legs   Lovenox  [enoxaparin Sodium] Other (See Comments)   Has been known to cause her blood clots in her legs   Prednisone Other (See Comments)   Pancreatitis, but has to take for asthma sometimes        Medication List     STOP taking these medications    azithromycin 500 MG tablet Commonly known as: ZITHROMAX   cefdinir 300 MG capsule Commonly known as: OMNICEF   OXYGEN       TAKE these medications    albuterol 108 (90 Base) MCG/ACT inhaler Commonly known as: VENTOLIN HFA Inhale 2 puffs into the lungs daily as needed for wheezing. For shortness of breath   albuterol (2.5 MG/3ML) 0.083% nebulizer solution Commonly known as: PROVENTIL Take 2.5 mg by nebulization every 6 (six) hours as needed for wheezing or shortness of breath.   amitriptyline 100 MG tablet Commonly known as: ELAVIL Take 100 mg by mouth at bedtime.   atorvastatin 20 MG tablet Commonly known as: LIPITOR Take 20 mg by mouth daily.  bisacodyl 5 MG EC tablet Commonly known as: DULCOLAX Take 1 tablet (5 mg total) by mouth 2 (two) times daily.   budesonide-formoterol 80-4.5 MCG/ACT  inhaler Commonly known as: Symbicort Inhale 2 puffs into the lungs 2 (two) times daily. What changed: when to take this   busPIRone 10 MG tablet Commonly known as: BUSPAR Take 10 mg by mouth 3 (three) times daily.   citalopram 40 MG tablet Commonly known as: CELEXA Take 40 mg by mouth daily.   cyclobenzaprine 5 MG tablet Commonly known as: FLEXERIL Take 5 mg by mouth 3 (three) times daily as needed for muscle spasms.   dextromethorphan-guaiFENesin 30-600 MG 12hr tablet Commonly known as: MUCINEX DM Take 1 tablet by mouth 2 (two) times daily for 10 days.   doxycycline 100 MG tablet Commonly known as: VIBRA-TABS Take 1 tablet (100 mg total) by mouth 2 (two) times daily for 4 days.   DSS 100 MG Caps Take 1 capsule by mouth at bedtime.   ferrous sulfate 325 (65 FE) MG tablet Take 325 mg by mouth daily.   furosemide 20 MG tablet Commonly known as: LASIX Take 1 tablet (20 mg total) by mouth daily.   gabapentin 300 MG capsule Commonly known as: NEURONTIN Take 300 mg by mouth 3 (three) times daily.   levothyroxine 25 MCG tablet Commonly known as: SYNTHROID Take 25 mcg by mouth daily before breakfast.   lisinopril 2.5 MG tablet Commonly known as: ZESTRIL Take 2.5 mg by mouth daily.   magnesium oxide 400 (240 Mg) MG tablet Commonly known as: MAG-OX Take 1 tablet (400 mg total) by mouth 2 (two) times daily.   metFORMIN 500 MG tablet Commonly known as: GLUCOPHAGE Take 2 tablets by mouth 2 (two) times daily.   methocarbamol 500 MG tablet Commonly known as: ROBAXIN Take 500 mg by mouth 3 (three) times daily.   mupirocin cream 2 % Commonly known as: BACTROBAN Apply 1 application topically daily. Apply to right 2nd toe and left great toe once daily until healed.   ondansetron 4 MG tablet Commonly known as: ZOFRAN Take 4 mg by mouth every 8 (eight) hours as needed for vomiting or nausea.   oxyCODONE 15 MG immediate release tablet Commonly known as: ROXICODONE Take  15 mg by mouth 4 (four) times daily.   pantoprazole 40 MG tablet Commonly known as: PROTONIX Take 40 mg by mouth 2 (two) times daily.   polyethylene glycol powder 17 GM/SCOOP powder Commonly known as: GLYCOLAX/MIRALAX Take 17 g by mouth 2 (two) times daily. Until daily soft stools  OTC What changed:  when to take this reasons to take this   predniSONE 20 MG tablet Commonly known as: DELTASONE Take 3 tablets by mouth daily x1 day; then 2 tablet by mouth daily x2 days; then 1 tablet by mouth daily x3 days; then half tablet by mouth daily x3 days and stop prednisone.   Spiriva HandiHaler 18 MCG inhalation capsule Generic drug: tiotropium Place 18 mcg into inhaler and inhale daily.   tamsulosin 0.4 MG Caps capsule Commonly known as: FLOMAX Take 1 capsule (0.4 mg total) by mouth daily. Start taking on: March 14, 2022   valACYclovir 500 MG tablet Commonly known as: VALTREX Take 500 mg by mouth daily.   Zenpep 20000-63000 units Cpep Generic drug: Pancrelipase (Lip-Prot-Amyl) Take 1 capsule by mouth 3 times a day with meals and 1 capsules with snacks.        Follow-up Information     Redmond School, MD. Schedule  an appointment as soon as possible for a visit in 10 day(s).   Specialty: Internal Medicine Contact information: [address] [place] [address] [phone]                Discharge Exam: Filed Weights   03/11/22 0500 03/12/22 0454 03/13/22 0500  Weight: 55.7 kg 53 kg 52.3 kg   General exam: Alert, awake, oriented x 3; significantly improved and doing much better.  Patient feels ready to go home.  No fever, no nausea, no vomiting, speaking in full sentences and not requiring oxygen supplementation. Respiratory system: Positive scattered rhonchi, no wheezing or crackles; no using accessory muscle.  Good saturation on room air. Cardiovascular system:RRR.  No rubs, no gallops, no JVD. Gastrointestinal system: Abdomen is nondistended, soft and  nontender. No organomegaly or masses felt. Normal bowel sounds heard. Central nervous system: Alert and oriented. No focal neurological deficits. Extremities: No cyanosis or clubbing. Skin: No petechiae. Psychiatry: Judgement and insight appear normal. Mood & affect appropriate.    Condition at discharge: Stable and improved.  The results of significant diagnostics from this hospitalization (including imaging, microbiology, ancillary and laboratory) are listed below for reference.   Imaging Studies: ECHOCARDIOGRAM COMPLETE  Result Date: 03/11/2022    ECHOCARDIOGRAM REPORT   Patient Name:   JAIDALYN SCHILLO Date of Exam: 03/11/2022 Medical Rec #:  [id number]         Height:       62.0 in Accession #:    [id number]        Weight:       122.8 lb Date of Birth:  1970/10/23         BSA:          1.554 m Patient Age:    1 years          BP:           164/81 mmHg Patient Gender: F                 HR:           71 bpm. Exam Location:  [facility] Procedure: 2D Echo, Cardiac Doppler and Color Doppler Indications:    Dyspnea  History:        Patient has prior history of Echocardiogram examinations, most                 recent 12/15/2016. COPD; Risk Factors:Hypertension, Diabetes,                 Dyslipidemia and Current Smoker.  Sonographer:    Wenda Low Referring Phys: [place]  1. Left ventricular ejection fraction, by estimation, is 60 to 65%. The left ventricle has normal function. The left ventricle has no regional wall motion abnormalities. Left ventricular diastolic parameters were normal.  2. Right ventricular systolic function is normal. The right ventricular size is normal. There is normal pulmonary artery systolic pressure. The estimated right ventricular systolic pressure is 66.5 mmHg.  3. Left atrial size was upper normal.  4. The mitral valve is grossly normal. Mild mitral valve regurgitation.  5. The aortic valve is tricuspid. Aortic valve regurgitation is not  visualized. Aortic valve mean gradient measures 3.0 mmHg.  6. The inferior vena cava is normal in size with greater than 50% respiratory variability, suggesting right atrial pressure of 3 mmHg. Comparison(s): Prior images unable to be directly viewed. FINDINGS  Left Ventricle: Left ventricular ejection fraction, by estimation, is 60 to 65%. The  left ventricle has normal function. The left ventricle has no regional wall motion abnormalities. The left ventricular internal cavity size was normal in size. There is  borderline left ventricular hypertrophy. Left ventricular diastolic parameters were normal. Right Ventricle: The right ventricular size is normal. No increase in right ventricular wall thickness. Right ventricular systolic function is normal. There is normal pulmonary artery systolic pressure. The tricuspid regurgitant velocity is 2.39 m/s, and  with an assumed right atrial pressure of 3 mmHg, the estimated right ventricular systolic pressure is 47.0 mmHg. Left Atrium: Left atrial size was upper normal. Right Atrium: Right atrial size was normal in size. Pericardium: There is no evidence of pericardial effusion. Mitral Valve: The mitral valve is grossly normal. Mild mitral valve regurgitation. MV peak gradient, 6.2 mmHg. The mean mitral valve gradient is 2.0 mmHg. Tricuspid Valve: The tricuspid valve is grossly normal. Tricuspid valve regurgitation is trivial. Aortic Valve: The aortic valve is tricuspid. There is mild aortic valve annular calcification. Aortic valve regurgitation is not visualized. Aortic valve mean gradient measures 3.0 mmHg. Aortic valve peak gradient measures 8.6 mmHg. Aortic valve area, by  VTI measures 2.98 cm. Pulmonic Valve: The pulmonic valve was grossly normal. Pulmonic valve regurgitation is trivial. Aorta: The aortic root is normal in size and structure. Venous: The inferior vena cava is normal in size with greater than 50% respiratory variability, suggesting right atrial pressure  of 3 mmHg. IAS/Shunts: No atrial level shunt detected by color flow Doppler.  LEFT VENTRICLE PLAX 2D LVIDd:         4.20 cm     Diastology LVIDs:         2.60 cm     LV e' medial:    11.10 cm/s LV PW:         1.00 cm     LV E/e' medial:  9.9 LV IVS:        0.90 cm     LV e' lateral:   10.30 cm/s LVOT diam:     2.00 cm     LV E/e' lateral: 10.7 LV SV:         87 LV SV Index:   56 LVOT Area:     3.14 cm  LV Volumes (MOD) LV vol d, MOD A2C: 34.0 ml LV vol d, MOD A4C: 48.8 ml LV vol s, MOD A2C: 15.3 ml LV vol s, MOD A4C: 18.6 ml LV SV MOD A2C:     18.7 ml LV SV MOD A4C:     48.8 ml LV SV MOD BP:      25.3 ml RIGHT VENTRICLE RV Basal diam:  3.15 cm RV Mid diam:    2.50 cm RV S prime:     13.40 cm/s TAPSE (M-mode): 3.3 cm LEFT ATRIUM             Index        RIGHT ATRIUM           Index LA diam:        3.40 cm 2.19 cm/m   RA Area:     11.20 cm LA Vol (A2C):   66.9 ml 43.06 ml/m  RA Volume:   22.70 ml  14.61 ml/m LA Vol (A4C):   31.4 ml 20.21 ml/m LA Biplane Vol: 47.0 ml 30.25 ml/m  AORTIC VALVE                    PULMONIC VALVE AV Area (Vmax):    2.48 cm  PV Vmax:       1.08 m/s AV Area (Vmean):   2.82 cm     PV Peak grad:  4.7 mmHg AV Area (VTI):     2.98 cm AV Vmax:           147.00 cm/s AV Vmean:          75.400 cm/s AV VTI:            0.291 m AV Peak Grad:      8.6 mmHg AV Mean Grad:      3.0 mmHg LVOT Vmax:         116.00 cm/s LVOT Vmean:        67.800 cm/s LVOT VTI:          0.276 m LVOT/AV VTI ratio: 0.95  AORTA Ao Root diam: 2.80 cm MITRAL VALVE                TRICUSPID VALVE MV Area (PHT): 4.65 cm     TR Peak grad:   22.8 mmHg MV Area VTI:   2.59 cm     TR Vmax:        239.00 cm/s MV Peak grad:  6.2 mmHg MV Mean grad:  2.0 mmHg     SHUNTS MV Vmax:       1.24 m/s     Systemic VTI:  0.28 m MV Vmean:      63.8 cm/s    Systemic Diam: 2.00 cm MV Decel Time: 163 msec MV E velocity: 110.00 cm/s MV A velocity: 66.80 cm/s MV E/A ratio:  1.65 Rozann Lesches MD Electronically signed by Rozann Lesches MD  Signature Date/Time: 03/11/2022/10:24:09 AM    Final    DG Chest 2 View  Result Date: 03/11/2022 CLINICAL DATA:  Shortness of breath and cough.  COPD/smoker. EXAM: CHEST - 2 VIEW COMPARISON:  Chest x-ray 03/10/2022. FINDINGS: Mild improvement in lung aeration. Persistent, but improved interstitial airspace opacities. No visible pleural effusions or pneumothorax. Cardia style silhouette is within normal limits and unchanged. Polyarticular degenerative change. Right upper quadrant clips. IMPRESSION: Mild improvement in lung aeration. Persistent, but improved interstitial airspace opacities could represent edema and/or infection. Electronically Signed   By: Margaretha Sheffield M.D.   On: 03/11/2022 08:26   DG Chest 2 View  Result Date: 03/10/2022 CLINICAL DATA:  Shortness of breath, cough EXAM: CHEST - 2 VIEW COMPARISON:  03/09/2022 FINDINGS: No significant change in examination with diffuse bilateral interstitial and heterogeneous airspace opacity superimposed upon emphysema. Heart and mediastinum are normal. Osseous structures unremarkable. IMPRESSION: No significant change in examination with diffuse bilateral interstitial and heterogeneous airspace opacity superimposed upon emphysema. Findings are consistent with edema or infection. No new airspace opacity. Electronically Signed   By: Delanna Ahmadi M.D.   On: 03/10/2022 08:45   CT Angio Chest Pulmonary Embolism (PE) W or WO Contrast  Result Date: 03/09/2022 CLINICAL DATA:  Left-sided chest pain and worsening shortness of breath EXAM: CT ANGIOGRAPHY CHEST WITH CONTRAST TECHNIQUE: Multidetector CT imaging of the chest was performed using the standard protocol during bolus administration of intravenous contrast. Multiplanar CT image reconstructions and MIPs were obtained to evaluate the vascular anatomy. RADIATION DOSE REDUCTION: This exam was performed according to the departmental dose-optimization program which includes automated exposure control,  adjustment of the mA and/or kV according to patient size and/or use of iterative reconstruction technique. CONTRAST:  45m OMNIPAQUE IOHEXOL 350 MG/ML SOLN COMPARISON:  08/25/2021 CT, chest x-ray from earlier in the same day.  FINDINGS: Cardiovascular: Thoracic aorta and its branches demonstrate atherosclerotic calcification. No aneurysmal dilatation or dissection is noted. Heart is not significantly enlarged in size. The pulmonary artery is well visualized within normal branching pattern. No definitive filling defect to suggest pulmonary embolism is noted. Mediastinum/Nodes: Thoracic inlet is within normal limits. No hilar or mediastinal adenopathy is noted. Moderate to large-sized hiatal hernia is noted. The remainder of the esophagus appears within normal limits. Lungs/Pleura: Significant progressive airspace opacity is noted throughout both lungs consistent with multifocal pneumonia. Small bilateral pleural effusions are seen. No sizable parenchymal nodules are noted. No cavitary lesions are seen. Upper Abdomen: Visualized upper abdomen is otherwise within normal limits aside from the hiatal hernia. Musculoskeletal: No acute rib abnormality is noted. Mild degenerative changes of the thoracic spine are seen. Chronic T12 and L1 compression deformities are noted. The L1 fracture is new from the prior CT. Review of the MIP images confirms the above findings. IMPRESSION: No evidence of pulmonary emboli. Moderate to large hiatal hernia. Significant airspace opacities throughout both lung similar to that seen on prior chest x-ray consistent with acute multifocal pneumonia. Small bilateral pleural effusions are noted as well. Aortic Atherosclerosis (ICD10-I70.0). Electronically Signed   By: Inez Catalina M.D.   On: 03/09/2022 21:08   DG CHEST PORT 1 VIEW  Result Date: 03/09/2022 CLINICAL DATA:  Hypoxia EXAM: PORTABLE CHEST 1 VIEW COMPARISON:  Abdominal series 03/09/2022.  Chest CT 08/25/2021 FINDINGS: Diffuse  reticulonodular opacities are seen throughout both lungs. Superimposed patchy airspace disease is seen in the bilateral mid lungs and right upper lobe, right greater than left. Costophrenic angles are clear. No pneumothorax. Cardiomediastinal silhouette is within normal limits. Large hiatal hernia again noted. No acute fractures. IMPRESSION: 1. Diffuse reticulonodular opacities throughout both lungs may be infectious or inflammatory. 2. Superimposed bilateral airspace disease, right greater than left. Electronically Signed   By: Ronney Asters M.D.   On: 03/09/2022 20:46   DG ABD ACUTE 2+V W 1V CHEST  Result Date: 03/09/2022 CLINICAL DATA:  Abdominal pain, shortness of breath EXAM: DG ABDOMEN ACUTE WITH 1 VIEW CHEST COMPARISON:  None Available. FINDINGS: Bowel gas pattern is unremarkable. No air-fluid levels or free air. Cholecystectomy clips. Interstitial changes with patchy opacities. Normal heart size. No pleural effusion or pneumothorax. IMPRESSION: Normal bowel gas pattern. Interstitial changes with patchy opacities. Similar findings are present in January. May reflect recurrent pneumonia superimposed on chronic disease. Electronically Signed   By: Macy Mis M.D.   On: 03/09/2022 08:20   CT ABDOMEN PELVIS WO CONTRAST  Result Date: 03/06/2022 CLINICAL DATA:  Abdominal pain, coffee-ground emesis EXAM: CT ABDOMEN AND PELVIS WITHOUT CONTRAST TECHNIQUE: Multidetector CT imaging of the abdomen and pelvis was performed following the standard protocol without IV contrast. RADIATION DOSE REDUCTION: This exam was performed according to the departmental dose-optimization program which includes automated exposure control, adjustment of the mA and/or kV according to patient size and/or use of iterative reconstruction technique. COMPARISON:  02/16/2022 FINDINGS: Lower chest: Patchy multifocal airspace opacities within the lung bases, right worse than left. Normal heart size. Hepatobiliary: No focal liver  abnormality is seen. Status post cholecystectomy. No biliary dilatation. Pancreas: Pancreas is atrophic.  No inflammatory changes. Spleen: Normal in size without focal abnormality. Adrenals/Urinary Tract: Adrenal glands are unremarkable. Kidneys are normal, without renal calculi, focal lesion, or hydronephrosis. Bladder is unremarkable. Stomach/Bowel: Moderate-sized hiatal hernia. Chronically thickened gastric folds within the portion of the stomach in the hernia sac. Borderline dilated loops of small bowel within  the lower abdomen with air-fluid levels. There is some fecalization of small bowel content. No abrupt transition point is seen. There is a large volume of stool throughout the colon. Normal appendix in the right lower quadrant. No pericolonic inflammatory changes. Vascular/Lymphatic: Scattered aortoiliac atherosclerotic calcifications without aneurysm. No abdominopelvic lymphadenopathy. Reproductive: Status post hysterectomy. No adnexal masses. Other: No free fluid. No abdominopelvic fluid collection. No pneumoperitoneum. No abdominal wall hernia. Musculoskeletal: Healing changes are noted at the L1 superior endplate compression fracture without progressive height loss. Chronic T12 compression fracture. No new or acute bony findings. IMPRESSION: 1. Borderline dilated loops of small bowel within the lower abdomen with air-fluid levels and some fecalization of small bowel content. No abrupt transition point is seen. Findings are suggestive of an ileus or enteritis versus developing small bowel obstruction. 2. Large volume of stool throughout the colon suggesting constipation. 3. Patchy multifocal airspace opacities within the lung bases, right worse than left, most compatible with multifocal pneumonia. 4. Moderate-sized hiatal hernia with chronically thickened gastric folds. 5. Healing changes at the L1 superior endplate compression fracture without progressive height loss. Chronic T12 compression fracture.  6. Aortic Atherosclerosis (ICD10-I70.0). Electronically Signed   By: Davina Poke D.O.   On: 03/06/2022 17:55   CT ABDOMEN PELVIS W CONTRAST  Result Date: 02/16/2022 CLINICAL DATA:  [place] a few days ago, with abdominal pain and lower back pain. EXAM: CT ABDOMEN AND PELVIS WITH CONTRAST TECHNIQUE: Multidetector CT imaging of the abdomen and pelvis was performed using the standard protocol following bolus administration of intravenous contrast. RADIATION DOSE REDUCTION: This exam was performed according to the departmental dose-optimization program which includes automated exposure control, adjustment of the mA and/or kV according to patient size and/or use of iterative reconstruction technique. CONTRAST:  40m OMNIPAQUE IOHEXOL 300 MG/ML  SOLN COMPARISON:  CTs without contrast 05/30/2021 and 12/04/2019. FINDINGS: Lower chest: Lung bases show scattered linear scarring or atelectasis without acute process. The cardiac size is normal. Moderate-sized hiatal hernia contains the proximal half of the stomach intrathoracic. Hepatobiliary: No focal liver abnormality is seen. Status post cholecystectomy. No biliary dilatation. Pancreas: Atrophic and otherwise unremarkable, particularly in the tail. Spleen: No suspicious abnormality. No splenomegaly. There is a tiny hypodensity in the medial upper spleen which is probably a small cyst and appears to have been present previously. Adrenals/Urinary Tract: No adrenal or renal cortical mass enhancement is seen and no calculus or hydronephrosis. Unremarkable bladder for the degree of distention. Stomach/Bowel: Moderate-sized hiatal hernia. Thickened folds noted chronically in the stomach, today also seen the duodenum and jejunum. There is no small bowel obstruction or inflammation. The appendix is upper-normal in caliber at 6 mm but is similar to prior studies without inflammation. Moderate to severe stool retention is increased in the cecum, ascending and proximal transverse  colon, with moderate fecal stasis in the descending segment. No dilatation or wall thickening. Vascular/Lymphatic: Aortic atherosclerosis. No enlarged abdominal or pelvic lymph nodes. There are multiple pelvic phleboliths. Reproductive: Status post hysterectomy. No adnexal masses. Other: No abdominal wall or inguinal hernia. No free air, hemorrhage, abscess or free fluid. Musculoskeletal: There is evidence of a recent upper plate anterior wedge compression fracture of the L1 vertebral body. There is approximally 30% anterior and 10% posterior vertebral height loss with trace posterosuperior cortical retropulsion and slight nondisplaced fragmentation along the anterior superior vertebral body. There is osteopenia with additional note of mild paraspinal soft tissue thickening at L1, but no space-occupying paraspinal hematoma or spinal canal hematoma is seen.  There is a chronic, unchanged moderate anterior wedge compression fracture of the T12 vertebral body and mild chronic wedging of T10 and T11 as well, also unchanged. Additionally noted likely acute nondisplaced fractures of the posteromedial left twelfth rib and the left L1 transverse process. There is subcutaneous stranding laterally in the right hip region but no pelvic or proximal femoral fractures IMPRESSION: 1. Recent upper plate anterior wedge compression fracture L1, with 30% anterior and 10% posterior height loss and trace retropulsion. 2. Recent, likely acute nondisplaced fractures of the posteromedial left twelfth rib and left L1 transverse process. 3. Old compression fractures of T10, T11 and T12. 4. Gastroenteritis without evidence of small-bowel obstruction or inflammation. Moderate-sized hiatal hernia. 5. Constipation, most significantly in the ascending and proximal transverse colon. 6. Upper normal appendiceal caliber but similar to prior studies with no inflammatory change. Electronically Signed   By: Telford Nab M.D.   On: 02/16/2022 20:20    CT L-SPINE NO CHARGE  Result Date: 02/16/2022 CLINICAL DATA:  Initial evaluation for back pain, recent fall. EXAM: CT LUMBAR SPINE WITHOUT CONTRAST TECHNIQUE: Multidetector CT imaging of the lumbar spine was performed without intravenous contrast administration. Multiplanar CT image reconstructions were also generated. RADIATION DOSE REDUCTION: This exam was performed according to the departmental dose-optimization program which includes automated exposure control, adjustment of the mA and/or kV according to patient size and/or use of iterative reconstruction technique. COMPARISON:  Prior study from 07/29/2012. FINDINGS: Segmentation: Standard. Lowest well-formed disc space labeled the L5-S1 level. Alignment: Exaggeration of the normal lumbar lordosis. No listhesis. Vertebrae: Acute compression fracture involving the superior endplate of L1 is seen. Associated height loss measures up to 35% with trace 2 mm bony retropulsion. Associated acute minimally displaced fracture of the left transverse process of L1 noted. No other acute fracture within the lumbar spine. Chronic compression fracture of T12 with up to 40% height loss with 2 mm bony retropulsion. Additional subacute to chronic bilateral posterior twelfth rib fractures noted, with incomplete union on the left. Visualized sacrum and pelvis intact. No discrete osseous lesions. Paraspinal and other soft tissues: Paraspinous soft tissues demonstrate no acute finding. Disc levels: Mild noncompressive disc bulging noted within the lower lumbar spine at L3-4 through L5-S1. Mild-to-moderate lower lumbar facet hypertrophy. No significant spinal stenosis. Foramina remain patent. IMPRESSION: 1. Acute compression fracture involving the superior endplate of L1 with up to 35% height loss and trace 2 mm bony retropulsion. 2. Associated acute minimally displaced fracture of the left transverse process of L1. 3. Chronic compression fracture of T12 with up to 40% height  loss with 2 mm bony retropulsion. 4. Additional subacute to chronic bilateral posterior twelfth rib fractures, with incomplete union on the left. Electronically Signed   By: Jeannine Boga M.D.   On: 02/16/2022 20:08    Microbiology: Results for orders placed or performed during the hospital encounter of 03/06/22  Culture, blood (Routine x 2)     Status: None   Collection Time: 03/06/22  2:53 PM   Specimen: BLOOD RIGHT ARM  Result Value Ref Range Status   Specimen Description   Final    BLOOD RIGHT ARM BOTTLES DRAWN AEROBIC AND ANAEROBIC   Special Requests Blood Culture adequate volume  Final   Culture   Final    NO GROWTH 5 DAYS Performed at [facility], [address] [place], [place] [address]    Report Status 03/11/2022 FINAL  Final  Culture, blood (Routine x 2)     Status: Abnormal  Collection Time: 03/06/22  4:35 PM   Specimen: Left Antecubital; Blood  Result Value Ref Range Status   Specimen Description   Final    LEFT ANTECUBITAL BOTTLES DRAWN AEROBIC AND ANAEROBIC Performed at [facility], [address], [place], [place] [address]    Special Requests   Final    Blood Culture adequate volume Performed at [facility], [address] [place], [place] [address]    Culture  Setup Time   Final    GRAM POSITIVE RODS AEROBIC BOTTLE ONLY Gram Stain Report Called to,Read Back By and Verified With: Morton Peters 2150 03/07/22 BY GMCGEEHON WORK DONE AT [facility] GRAM STAIN REVIEWED-AGREE WITH RESULT    Culture (A)  Final    BACILLUS SPECIES NOT ANTHRACIS Standardized susceptibility testing for this organism is not available. Performed at [facility], [place] [address], [place], [place] [address]    Report Status 03/10/2022 FINAL  Final  Urine Culture     Status: None   Collection Time: 03/06/22  6:38 PM   Specimen: In/Out Cath Urine  Result Value Ref Range Status   Specimen Description   Final    IN/OUT CATH URINE Performed at [facility], [address],  [place], [place] [address]    Special Requests   Final    NONE Performed at [facility], [address] [place], [place] [address]    Culture   Final    NO GROWTH Performed at [facility], [place] [address], [place], [place] [address]    Report Status 03/08/2022 FINAL  Final  Resp Panel by RT-PCR (Flu A&B, Covid) Anterior Nasal Swab     Status: None   Collection Time: 03/06/22  9:30 PM   Specimen: Anterior Nasal Swab  Result Value Ref Range Status   SARS Coronavirus 2 by RT PCR NEGATIVE NEGATIVE Final    Comment: (NOTE) SARS-CoV-2 target nucleic acids are NOT DETECTED.  The SARS-CoV-2 RNA is generally detectable in upper respiratory specimens during the acute phase of infection. The lowest concentration of SARS-CoV-2 viral copies this assay can detect is 138 copies/mL. A negative result does not preclude SARS-Cov-2 infection and should not be used as the sole basis for treatment or other patient management decisions. A negative result may occur with  improper specimen collection/handling, submission of specimen other than nasopharyngeal swab, presence of viral mutation(s) within the areas targeted by this assay, and inadequate number of viral copies(<138 copies/mL). A negative result must be combined with clinical observations, patient history, and epidemiological information. The expected result is Negative.  Fact Sheet for Patients:  EntrepreneurPulse.com.au  Fact Sheet for Healthcare Providers:  IncredibleEmployment.be  This test is no t yet approved or cleared by the [place] FDA and  has been authorized for detection and/or diagnosis of SARS-CoV-2 by FDA under an Emergency Use Authorization (EUA). This EUA will remain  in effect (meaning this test can be used) for the duration of the COVID-19 declaration under Section 564(b)(1) of the Act, 21 U.S.C.section 360bbb-3(b)(1), unless the authorization is terminated  or revoked sooner.        Influenza A by PCR NEGATIVE NEGATIVE Final   Influenza B by PCR NEGATIVE NEGATIVE Final    Comment: (NOTE) The Xpert Xpress SARS-CoV-2/FLU/RSV plus assay is intended as an aid in the diagnosis of influenza from Nasopharyngeal swab specimens and should not be used as a sole basis for treatment. Nasal washings and aspirates are unacceptable for Xpert Xpress SARS-CoV-2/FLU/RSV testing.  Fact  Sheet for Patients: EntrepreneurPulse.com.au  Fact Sheet for Healthcare Providers: IncredibleEmployment.be  This test is not yet approved or cleared by the [place] FDA and has been authorized for detection and/or diagnosis of SARS-CoV-2 by FDA under an Emergency Use Authorization (EUA). This EUA will remain in effect (meaning this test can be used) for the duration of the COVID-19 declaration under Section 564(b)(1) of the Act, 21 U.S.C. section 360bbb-3(b)(1), unless the authorization is terminated or revoked.  Performed at [facility], [address], [place], [place] [address]   MRSA Next Gen by PCR, Nasal     Status: None   Collection Time: 03/07/22  3:42 PM   Specimen: Nasal Mucosa; Nasal Swab  Result Value Ref Range Status   MRSA by PCR Next Gen NOT DETECTED NOT DETECTED Final    Comment: (NOTE) The GeneXpert MRSA Assay (FDA approved for NASAL specimens only), is one component of a comprehensive MRSA colonization surveillance program. It is not intended to diagnose MRSA infection nor to guide or monitor treatment for MRSA infections. Test performance is not FDA approved in patients less than 53 years old. Performed at [facility], [address] [place], [place] [address]     Labs: CBC: Recent Labs  Lab 03/06/22 1432 03/06/22 1754 03/09/22 0515 03/09/22 2113 03/11/22 0406 03/12/22 0447 03/13/22 0824  WBC 14.2*   < > 9.1 10.0 8.1 8.5 9.3  NEUTROABS 11.6*  --   --   --   --   --   --   HGB 11.8*   < > 8.5* 9.9* 9.2* 9.0*  11.0*  HCT 37.8   < > 29.1* 32.3* 28.5* 28.2* 34.6*  MCV 90.6   < > 93.9 92.3 86.9 87.9 87.8  PLT 322   < > 208 208 198 256 328   < > = values in this interval not displayed.   Basic Metabolic Panel: Recent Labs  Lab 03/07/22 0435 03/08/22 0356 03/09/22 2113 03/10/22 0358 03/11/22 0406 03/12/22 0447 03/13/22 0824  NA 139 137 140  --  140 139 138  K 4.8 4.5 3.6  --  3.7 3.6 3.2*  CL 112* 112* 110  --  102 99 93*  CO2 22 22 18*  --  29 33* 31  GLUCOSE 133* 156* 138*  --  173* 157* 188*  BUN 34* 16 12  --  9 11 15   CREATININE 1.11* 0.60 0.79  --  0.53 0.53 0.71  CALCIUM 7.5* 8.2* 8.5*  --  8.5* 8.7* 9.0  MG 1.4*  --   --  1.1*  --   --   --   PHOS 3.5  --   --   --  4.6  --   --    Liver Function Tests: Recent Labs  Lab 03/06/22 1617 03/09/22 2113 03/11/22 0406  AST 41 21  --   ALT 27 15  --   ALKPHOS 97 70  --   BILITOT 0.5 <0.1*  --   PROT 7.4 6.2*  --   ALBUMIN 3.6 2.8* 2.5*   CBG: Recent Labs  Lab 03/12/22 1144 03/12/22 1653 03/12/22 2035 03/13/22 0726 03/13/22 1131  GLUCAP 117* 141* 132* 122* 150*    Discharge time spent: greater than 30 minutes.  Signed: Barton Dubois, MD [facility] 03/13/2022

## 2022-03-23 ENCOUNTER — Ambulatory Visit (HOSPITAL_COMMUNITY): Payer: Medicare Other

## 2022-03-31 ENCOUNTER — Other Ambulatory Visit: Payer: Self-pay

## 2022-03-31 DIAGNOSIS — D500 Iron deficiency anemia secondary to blood loss (chronic): Secondary | ICD-10-CM

## 2022-03-31 NOTE — Progress Notes (Deleted)
NO SHOW

## 2022-04-01 ENCOUNTER — Inpatient Hospital Stay: Payer: Medicare Other

## 2022-04-01 ENCOUNTER — Inpatient Hospital Stay: Payer: Medicare Other | Attending: Hematology

## 2022-04-01 ENCOUNTER — Inpatient Hospital Stay: Payer: Medicare Other | Admitting: Physician Assistant

## 2022-04-30 ENCOUNTER — Encounter (HOSPITAL_COMMUNITY): Payer: Self-pay | Admitting: Hematology

## 2022-05-11 ENCOUNTER — Ambulatory Visit (HOSPITAL_COMMUNITY)
Admission: RE | Admit: 2022-05-11 | Discharge: 2022-05-11 | Disposition: A | Discharge status: Home / Self Care | Payer: Medicare Other | Source: Ambulatory Visit | Attending: Physician Assistant | Admitting: Physician Assistant

## 2022-05-11 DIAGNOSIS — Z87891 Personal history of nicotine dependence: Secondary | ICD-10-CM | POA: Insufficient documentation

## 2022-05-30 NOTE — Progress Notes (Deleted)
NO SHOW

## 2022-06-01 ENCOUNTER — Ambulatory Visit: Payer: Medicare Other | Admitting: Physician Assistant

## 2022-06-01 ENCOUNTER — Inpatient Hospital Stay: Payer: Medicare Other | Attending: Hematology

## 2022-06-01 ENCOUNTER — Other Ambulatory Visit: Payer: Medicare Other

## 2022-06-01 ENCOUNTER — Ambulatory Visit: Payer: Medicare Other

## 2022-06-01 ENCOUNTER — Inpatient Hospital Stay: Payer: Medicare Other

## 2022-06-01 ENCOUNTER — Inpatient Hospital Stay: Payer: Medicare Other | Admitting: Physician Assistant

## 2022-06-02 ENCOUNTER — Other Ambulatory Visit (HOSPITAL_COMMUNITY): Payer: Self-pay | Admitting: Internal Medicine

## 2022-06-02 DIAGNOSIS — E2839 Other primary ovarian failure: Secondary | ICD-10-CM

## 2022-06-09 ENCOUNTER — Other Ambulatory Visit (HOSPITAL_COMMUNITY): Payer: Medicare Other

## 2022-06-19 ENCOUNTER — Inpatient Hospital Stay (HOSPITAL_COMMUNITY): Admission: RE | Admit: 2022-06-19 | Payer: Medicare Other | Source: Ambulatory Visit

## 2022-06-29 ENCOUNTER — Ambulatory Visit (HOSPITAL_COMMUNITY)
Admission: RE | Admit: 2022-06-29 | Discharge: 2022-06-29 | Disposition: A | Discharge status: Home / Self Care | Payer: Medicare Other | Source: Ambulatory Visit | Attending: Internal Medicine | Admitting: Internal Medicine

## 2022-06-29 DIAGNOSIS — E2839 Other primary ovarian failure: Secondary | ICD-10-CM | POA: Insufficient documentation

## 2022-07-09 ENCOUNTER — Inpatient Hospital Stay: Payer: Medicare Other | Attending: Hematology

## 2022-07-09 ENCOUNTER — Inpatient Hospital Stay: Payer: Medicare Other

## 2022-07-09 ENCOUNTER — Inpatient Hospital Stay (HOSPITAL_BASED_OUTPATIENT_CLINIC_OR_DEPARTMENT_OTHER): Payer: Medicare Other | Admitting: Physician Assistant

## 2022-07-09 ENCOUNTER — Other Ambulatory Visit: Payer: Self-pay

## 2022-07-09 VITALS — BP 135/90 | HR 88 | Temp 98.4°F | Resp 16 | Wt 118.8 lb

## 2022-07-09 DIAGNOSIS — Z23 Encounter for immunization: Secondary | ICD-10-CM | POA: Insufficient documentation

## 2022-07-09 DIAGNOSIS — E1140 Type 2 diabetes mellitus with diabetic neuropathy, unspecified: Secondary | ICD-10-CM | POA: Diagnosis not present

## 2022-07-09 DIAGNOSIS — F17210 Nicotine dependence, cigarettes, uncomplicated: Secondary | ICD-10-CM | POA: Insufficient documentation

## 2022-07-09 DIAGNOSIS — D500 Iron deficiency anemia secondary to blood loss (chronic): Secondary | ICD-10-CM

## 2022-07-09 DIAGNOSIS — Z90710 Acquired absence of both cervix and uterus: Secondary | ICD-10-CM | POA: Diagnosis not present

## 2022-07-09 DIAGNOSIS — E538 Deficiency of other specified B group vitamins: Secondary | ICD-10-CM

## 2022-07-09 DIAGNOSIS — D509 Iron deficiency anemia, unspecified: Secondary | ICD-10-CM | POA: Insufficient documentation

## 2022-07-09 DIAGNOSIS — D649 Anemia, unspecified: Secondary | ICD-10-CM

## 2022-07-09 LAB — CBC WITH DIFFERENTIAL/PLATELET
Abs Immature Granulocytes: 0.02 10*3/uL (ref 0.00–0.07)
Basophils Absolute: 0 10*3/uL (ref 0.0–0.1)
Basophils Relative: 0 %
Eosinophils Absolute: 0.3 10*3/uL (ref 0.0–0.5)
Eosinophils Relative: 4 %
HCT: 36.7 % (ref 36.0–46.0)
Hemoglobin: 11.6 g/dL — ABNORMAL LOW (ref 12.0–15.0)
Immature Granulocytes: 0 %
Lymphocytes Relative: 25 %
Lymphs Abs: 2.2 10*3/uL (ref 0.7–4.0)
MCH: 30.7 pg (ref 26.0–34.0)
MCHC: 31.6 g/dL (ref 30.0–36.0)
MCV: 97.1 fL (ref 80.0–100.0)
Monocytes Absolute: 0.5 10*3/uL (ref 0.1–1.0)
Monocytes Relative: 6 %
Neutro Abs: 5.5 10*3/uL (ref 1.7–7.7)
Neutrophils Relative %: 65 %
Platelets: 250 10*3/uL (ref 150–400)
RBC: 3.78 MIL/uL — ABNORMAL LOW (ref 3.87–5.11)
RDW: 15.1 % (ref 11.5–15.5)
WBC: 8.6 10*3/uL (ref 4.0–10.5)
nRBC: 0 % (ref 0.0–0.2)

## 2022-07-09 LAB — IRON AND TIBC
Iron: 29 ug/dL (ref 28–170)
Saturation Ratios: 9 % — ABNORMAL LOW (ref 10.4–31.8)
TIBC: 333 ug/dL (ref 250–450)
UIBC: 304 ug/dL

## 2022-07-09 LAB — SAMPLE TO BLOOD BANK

## 2022-07-09 LAB — FERRITIN: Ferritin: 7 ng/mL — ABNORMAL LOW (ref 11–307)

## 2022-07-09 MED ORDER — INFLUENZA VAC SPLIT QUAD 0.5 ML IM SUSY
0.5000 mL | PREFILLED_SYRINGE | Freq: Once | INTRAMUSCULAR | Status: AC
  Administered 2022-07-09: 0.5 mL via INTRAMUSCULAR
  Filled 2022-07-09: qty 0.5

## 2022-07-09 MED ORDER — CYANOCOBALAMIN 1000 MCG/ML IJ SOLN
1000.0000 ug | Freq: Once | INTRAMUSCULAR | Status: AC
  Administered 2022-07-09: 1000 ug via INTRAMUSCULAR
  Filled 2022-07-09: qty 1

## 2022-07-09 NOTE — Progress Notes (Addendum)
[name], [place] [address]   CLINIC:  Medical Oncology/Hematology  PCP:  [facility], [place] [address] [phone]   REASON FOR VISIT:  Follow-up for normocytic anemia with iron deficiency and B12 deficiency  CURRENT THERAPY: IV iron infusions + B12 injections  INTERVAL HISTORY:  [name] 51 y.o. female returns for routine follow-up of her iron deficiency anemia and vitamin B12 deficiency.  She was seen for initial consultation by Dr. Delton Coombes and Tarri Abernethy PA-C on 01/29/2022.  At today's visit, she reports feeling fair.  Since her last visit, she sustained a mechanical fall in July 2023 and was found to have acute fractures of L1 vertebrae per ED visit on 02/16/2022.   Additionally, she was hospitalized from 03/06/2022 through 03/13/2022 due to sepsis/septic shock secondary to pneumonia, as well as acute on chronic anemia with heme positive stools, hematemesis, and other clinical issues documented in hospital discharge summary dated 03/13/2022.    Her GI provider is Dr. Newman Pies ([facility]).  She underwent EGD/colonoscopy on 05/25/2022 - esophagus, stomach, and duodenum appeared normal.  Colonoscopy unable to be completed due to poor prep, recommended for repeat colonoscopy in 1 year.  She has not had any hematemesis or melena since hospital discharge in August; no rectal bleeding.  She is symptomatic with severe fatigue, headaches, palpitations, and lightheadedness.  No pica.  She denies any recent chest pain.  She reports baseline dyspnea on exertion from her COPD.  She received IV Venofer 300 mg x 2 on 02/02/2022 and 02/09/2022 with some improvement in energy.  She received vitamin B12 1000 mcg injection x3.  She missed her other IV iron and vitamin B12 injections due to ED visits and hospitalizations as described above.  She has 30% energy and 30% appetite. She endorses that she is  maintaining a stable weight.   REVIEW OF SYSTEMS:  Review of Systems  Constitutional:  Positive for fatigue. Negative for appetite change, chills, diaphoresis, fever and unexpected weight change.  HENT:   Negative for lump/mass and nosebleeds.   Eyes:  Negative for eye problems.  Respiratory:  Positive for cough and shortness of breath. Negative for hemoptysis.   Cardiovascular:  Positive for palpitations. Negative for chest pain and leg swelling.  Gastrointestinal:  Positive for constipation and nausea. Negative for abdominal pain, blood in stool, diarrhea and vomiting.  Genitourinary:  Negative for hematuria.   Skin: Negative.   Neurological:  Positive for dizziness, headaches and light-headedness.  Hematological:  Does not bruise/bleed easily.  Psychiatric/Behavioral:  Positive for depression and sleep disturbance.       PAST MEDICAL/SURGICAL HISTORY:  Past Medical History:  Diagnosis Date   Anemia    Anxiety    Asthma    Bipolar 1 disorder ([place])    Chronic abdominal pain    COPD (chronic obstructive pulmonary disease) (HCC)    Depression    Diabetes mellitus    Diabetic neuropathy ([place]) 10/29/2017   Esophagitis    Hiatal hernia    Hyperlipidemia 02/03/2012   Iron deficiency anemia due to chronic blood loss 01/29/2022   Migraine    Neck pain 06/08/2012   Pancreatitis chronic Idiopathic   Treated by Dr. Newman Pies at [facility] in the past with celiac blocks.   Past Surgical History:  Procedure Laterality Date   ABDOMINAL HYSTERECTOMY     attempted colonoscopy  02/2017   Dr. Newman Pies: Poor prep, scope passed to mid transverse colon  before colonoscopy aborted.  No significant findings noted to mid transverse colon.  Next colonoscopy in 2 years.   CELIAC PLEXUS BLOCK     CHOLECYSTECTOMY     COLONOSCOPY N/A 12/05/2012   OZD:GUYQIH rectum, colon and terminal ileum   ESOPHAGOGASTRODUODENOSCOPY  02/20/2008   KVQ:[id number] distal esophageal mucosa, suspicious for  neoplasm/Hiatal hernia otherwise normal    ESOPHAGOGASTRODUODENOSCOPY  04/03/2008   IEP:[id number] hiatal hernia, otherwise normal/Short, tight, benign-appearing peptic stricture   ESOPHAGOGASTRODUODENOSCOPY  November 16, 2012   Dr. Britta Mccreedy: hiatal hernia, esophagitis,    ESOPHAGOGASTRODUODENOSCOPY (EGD) WITH PROPOFOL N/A 05/09/2019   Dr. Oneida Alar: Large hiatal hernia with erythema and edema in the pouch, mild gastritis.  No biopsies taken.   ESOPHAGOGASTRODUODENOSCOPY (EGD) WITH PROPOFOL N/A 05/26/2019   Procedure: ESOPHAGOGASTRODUODENOSCOPY (EGD) WITH PROPOFOL;  Surgeon: Danie Binder, MD;  [facility];  Service: Endoscopy;  Laterality: N/A;   EUS  12/2018   Dr. Newman Pies: LA grade B esophagitis, antritis but negative H. pylori, 1 cm ulcer at the duodenal sweep   GIVENS CAPSULE STUDY N/A 12/05/2012   Few superficial erosions but nothing found to explain iron deficiency anemia.    GIVENS CAPSULE STUDY N/A 05/26/2019   Procedure: GIVENS CAPSULE STUDY;  Surgeon: Danie Binder, MD;  Location: [facility];  Service: Endoscopy;  Laterality: N/A;   Ileocolonoscopy  06/05/2008     TKZ:SWFUXNA anal canal, otherwise normal rectum, colon   TONSILLECTOMY       SOCIAL HISTORY:  Social History   Socioeconomic History   Marital status: Divorced    Spouse name: Not on file   Number of children: Not on file   Years of education: Not on file   Highest education level: Some college, no degree  Occupational History   Occupation: disabled  Tobacco Use   Smoking status: Every Day    Packs/day: 1.00    Years: 25.00    Total pack years: 25.00    Types: Cigarettes    Passive exposure: Current   Smokeless tobacco: Never  Vaping Use   Vaping Use: Never used  Substance and Sexual Activity   Alcohol use: No   Drug use: No   Sexual activity: Yes    Birth control/protection: Surgical  Other Topics Concern   Not on file  Social History Narrative   ** Merged History Encounter **        She is on disability for pancreatitis since 2014.  She was previously working as a Presenter, broadcasting in 2004. She lives with her husband in a two level home.    Social Determinants of Health   Financial Resource Strain: Low Risk  (02/16/2019)   Overall Financial Resource Strain (CARDIA)    Difficulty of Paying Living Expenses: Not hard at all  Food Insecurity: No Food Insecurity (10/16/2019)   Hunger Vital Sign    Worried About Running Out of Food in the Last Year: Never true    Ran Out of Food in the Last Year: Never true  Transportation Needs: No Transportation Needs (10/16/2019)   PRAPARE - Hydrologist (Medical): No    Lack of Transportation (Non-Medical): No  Physical Activity: Not on file  Stress: Not on file  Social Connections: Not on file  Intimate Partner Violence: Not on file    FAMILY HISTORY:  Family History  Problem Relation Age of Onset   Asthma Other    Asthma Paternal Grandfather    Heart attack Paternal Grandfather  Hypertension Mother    Cancer Maternal Grandmother    Diabetes Paternal Grandmother    Coronary artery disease Neg Hx    Colon cancer Neg Hx     CURRENT MEDICATIONS:  Outpatient Encounter Medications as of 07/09/2022  Medication Sig   albuterol (PROVENTIL HFA;VENTOLIN HFA) 108 (90 BASE) MCG/ACT inhaler Inhale 2 puffs into the lungs daily as needed for wheezing. For shortness of breath   albuterol (PROVENTIL) (2.5 MG/3ML) 0.083% nebulizer solution Take 2.5 mg by nebulization every 6 (six) hours as needed for wheezing or shortness of breath.    amitriptyline (ELAVIL) 100 MG tablet Take 100 mg by mouth at bedtime.   atorvastatin (LIPITOR) 20 MG tablet Take 20 mg by mouth daily.   bisacodyl (DULCOLAX) 5 MG EC tablet Take 1 tablet (5 mg total) by mouth 2 (two) times daily.   budesonide-formoterol (SYMBICORT) 80-4.5 MCG/ACT inhaler Inhale 2 puffs into the lungs 2 (two) times daily. (Patient taking differently: Inhale 2  puffs into the lungs daily.)   busPIRone (BUSPAR) 10 MG tablet Take 10 mg by mouth 3 (three) times daily.   citalopram (CELEXA) 40 MG tablet Take 40 mg by mouth daily.   cyclobenzaprine (FLEXERIL) 5 MG tablet Take 5 mg by mouth 3 (three) times daily as needed for muscle spasms.   Docusate Sodium (DSS) 100 MG CAPS Take 1 capsule by mouth at bedtime.   ferrous sulfate 325 (65 FE) MG tablet Take 325 mg by mouth daily.   furosemide (LASIX) 20 MG tablet Take 1 tablet (20 mg total) by mouth daily.   gabapentin (NEURONTIN) 300 MG capsule Take 300 mg by mouth 3 (three) times daily.    levothyroxine (SYNTHROID, LEVOTHROID) 25 MCG tablet Take 25 mcg by mouth daily before breakfast.   lisinopril (PRINIVIL,ZESTRIL) 2.5 MG tablet Take 2.5 mg by mouth daily.    magnesium oxide (MAG-OX) 400 (240 Mg) MG tablet Take 1 tablet (400 mg total) by mouth 2 (two) times daily.   metFORMIN (GLUCOPHAGE) 500 MG tablet Take 2 tablets by mouth 2 (two) times daily.   methocarbamol (ROBAXIN) 500 MG tablet Take 500 mg by mouth 3 (three) times daily.   mupirocin cream (BACTROBAN) 2 % Apply 1 application topically daily. Apply to right 2nd toe and left great toe once daily until healed.   ondansetron (ZOFRAN) 4 MG tablet Take 4 mg by mouth every 8 (eight) hours as needed for vomiting or nausea.   oxyCODONE (ROXICODONE) 15 MG immediate release tablet Take 15 mg by mouth 4 (four) times daily.    Pancrelipase, Lip-Prot-Amyl, (ZENPEP) 20000-63000 units CPEP Take 1 capsule by mouth 3 times a day with meals and 1 capsules with snacks.   pantoprazole (PROTONIX) 40 MG tablet Take 40 mg by mouth 2 (two) times daily.   polyethylene glycol powder (GLYCOLAX/MIRALAX) powder Take 17 g by mouth 2 (two) times daily. Until daily soft stools  OTC (Patient taking differently: Take 17 g by mouth as needed for mild constipation. Until daily soft stools  OTC)   predniSONE (DELTASONE) 20 MG tablet Take 3 tablets by mouth daily x1 day; then 2 tablet  by mouth daily x2 days; then 1 tablet by mouth daily x3 days; then half tablet by mouth daily x3 days and stop prednisone.   SPIRIVA HANDIHALER 18 MCG inhalation capsule Place 18 mcg into inhaler and inhale daily.    tamsulosin (FLOMAX) 0.4 MG CAPS capsule Take 1 capsule (0.4 mg total) by mouth daily.   valACYclovir (VALTREX) 500 MG  tablet Take 500 mg by mouth daily.   No facility-administered encounter medications on file as of 07/09/2022.    ALLERGIES:  Allergies  Allergen Reactions   Penicillins Shortness Of Breath     Has patient had a PCN reaction causing immediate rash, facial/tongue/throat swelling, SOB or lightheadedness with hypotension: Yes Has patient had a PCN reaction causing severe rash involving mucus membranes or skin necrosis: Yes Has patient had a PCN reaction that required hospitalization No Has patient had a PCN reaction occurring within the last 10 years: No  If all of the above answers are "NO", then may proceed with Cephalosporin use. Tolerated ceftriaxone   Sulfa Antibiotics Shortness Of Breath   Aspirin Nausea And Vomiting   Enoxaparin Other (See Comments)    Has been known to cause her blood clots in her legs Has been known to cause her blood clots in her legs   Lovenox  [Enoxaparin Sodium] Other (See Comments)    Has been known to cause her blood clots in her legs   Prednisone Other (See Comments)    Pancreatitis, but has to take for asthma sometimes     PHYSICAL EXAM:  ECOG PERFORMANCE STATUS: 1 - Symptomatic but completely ambulatory  There were no vitals filed for this visit. There were no vitals filed for this visit. Physical Exam Constitutional:      Appearance: Normal appearance.  HENT:     Head: Normocephalic and atraumatic.     Mouth/Throat:     Mouth: Mucous membranes are moist.  Eyes:     Extraocular Movements: Extraocular movements intact.     Pupils: Pupils are equal, round, and reactive to light.  Cardiovascular:     Rate and  Rhythm: Normal rate and regular rhythm.     Pulses: Normal pulses.     Heart sounds: Normal heart sounds.  Pulmonary:     Effort: Pulmonary effort is normal.     Breath sounds: Normal breath sounds.  Abdominal:     General: Bowel sounds are normal.     Palpations: Abdomen is soft.     Tenderness: There is no abdominal tenderness.  Musculoskeletal:        General: No swelling.     Right lower leg: No edema.     Left lower leg: No edema.  Lymphadenopathy:     Cervical: No cervical adenopathy.  Skin:    General: Skin is warm and dry.  Neurological:     General: No focal deficit present.     Mental Status: She is alert and oriented to person, place, and time.  Psychiatric:        Mood and Affect: Mood normal.        Behavior: Behavior normal.      LABORATORY DATA:  I have reviewed the labs as listed.  CBC    Component Value Date/Time   WBC 8.6 07/09/2022 0831   RBC 3.78 (L) 07/09/2022 0831   HGB 11.6 (L) 07/09/2022 0831   HGB 11.3 10/09/2019 1418   HCT 36.7 07/09/2022 0831   HCT 34.6 10/09/2019 1418   PLT 250 07/09/2022 0831   PLT 307 10/09/2019 1418   MCV 97.1 07/09/2022 0831   MCV 91 10/09/2019 1418   MCH 30.7 07/09/2022 0831   MCHC 31.6 07/09/2022 0831   RDW 15.1 07/09/2022 0831   RDW 13.6 10/09/2019 1418   LYMPHSABS 2.2 07/09/2022 0831   MONOABS 0.5 07/09/2022 0831   EOSABS 0.3 07/09/2022 0831   BASOSABS 0.0 07/09/2022  0831      Latest Ref Rng & Units 03/13/2022    8:24 AM 03/12/2022    4:47 AM 03/11/2022    4:06 AM  CMP  Glucose 70 - 99 mg/dL 188  157  173   BUN 6 - 20 mg/dL '15  11  9   '$ Creatinine 0.44 - 1.00 mg/dL 0.71  0.53  0.53   Sodium 135 - 145 mmol/L 138  139  140   Potassium 3.5 - 5.1 mmol/L 3.2  3.6  3.7   Chloride 98 - 111 mmol/L 93  99  102   CO2 22 - 32 mmol/L 31  33  29   Calcium 8.9 - 10.3 mg/dL 9.0  8.7  8.5     DIAGNOSTIC IMAGING:  I have independently reviewed the relevant imaging and discussed with the patient.  ASSESSMENT &  PLAN: 1.  Normocytic anemia - Longstanding history of intermittent iron deficiency anemia since at least 2009, lowest Hgb 6.4. - She has required multiple blood transfusions, most recently given February 2023  - History of GI bleeding from erosive esophagitis and small bowel AVMs. - She follows with GI at [facility] (Dr. Steward Drone) - Hospitalized from 03/06/2022 through 03/13/2022 due to sepsis/septic shock/pneumonia, as well as acute on chronic anemia with heme positive stools and hematemesis.  EGD/colonoscopy was deferred to the outpatient setting. - EGD/colonoscopy on 05/25/2022 - esophagus, stomach, and duodenum appeared normal.  Colonoscopy unable to be completed due to poor prep, recommended for repeat colonoscopy in 1 year. - Hematology work-up (01/29/2022): SPEP/immunofixation normal.  Labs consistent with iron deficiency and vitamin B12 deficiency. - Failed to improve on oral iron supplements, caused constipation and nausea (discontinued) - Intermittent episodes of hematemesis and melena, none since August 2023 - Received IV Venofer 300 mg x 2 in July 2023  - Symptomatic with fatigue, pica, lightheadedness, and chest pain  - Most recent labs (07/09/2022): Hgb 11.6/MCV 97.1, ferritin 7, iron saturation 9% - Differential diagnosis favors iron deficiency anemia secondary to chronic GI blood loss - PLAN: Recommend IV iron with Venofer 300 mg x 3 - Recommend continued close follow-up with GI. - RTC in 3 months for repeat CBC/iron panel   2.  Vitamin B12 deficiency - Labs from 01/29/2022 showed low vitamin B12 at 132, elevated MMA 1497.  Homocystine elevated at 16.8.  Folate normal. - Patient started on monthly B12 injections, but was lost to follow-up after her injection in August 2023 - PLAN: We will give B12 injection today and will resume monthly B12 injections.  Repeat B12/MMA/homocystine in 3 to 6 months.    3.  Weight loss - Weight has been up and down over the past several  years, overall has lost about 50 pounds in the last 5 years. - Patient thinks weight loss may be related to chronic pancreatitis, as she has had multiple episodes where she is unable to eat. - CT abdomen/pelvis (03/06/2022) and CTA chest (03/09/2022) negative for any signs of malignancy. - She was seen by dietitian on 02/02/2022 - Weight today stable at 118 pounds - PLAN: We will continue to monitor weight at follow-up visits  4.  Tobacco abuse - She has been smoking 1 PPD cigarettes x25 years - Patient referred for LDCT chest, but this was missed due to acute illness and hospitalization - CTA chest (03/09/2022) states no sizable parenchymal nodules noted, but there was significant progressive airspace opacity consistent with pneumonia - LDCT chest (05/11/2022): Lung RADS 2,  benign appearance or behavior.  No overtly suspicious nodule or mass.  No focal airspace consolidation. - PLAN: Continue annual LDCT scan - Smoking cessation counseling provided  5.  Other history - PMH: Chronic pancreatitis, chronic pain, hyperlipidemia, hypertension, hypothyroidism, COPD, diabetes mellitus, irritable bowel syndrome, anxiety, bipolar 1 disorder, depression, and sleep apnea - SOCIAL: She is a current smoker, smoking 1 PPD for the last 25 years.  She denies any history of alcohol or illicit drugs.  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 - FAMILY: She denies any family history of blood disorders.  Her maternal grandfather had throat cancer.  Family history otherwise positive for coronary artery disease, hypertension, and diabetes.   PLAN SUMMARY: >> Venofer 300 mg x 3 >> B12 injection TODAY + monthly B12 injections >> Labs in 3 months (CBC/D, ferritin, iron/TIBC, B12, MMA) >> OFFICE visit 1 week after labs   All questions were answered. The patient knows to call the clinic with any problems, questions or concerns.  Medical decision making: Moderate  Time spent on visit: I spent 20  minutes counseling the patient face to face. The total time spent in the appointment was 30 minutes and more than 50% was on counseling.   Harriett Rush, PA-C  07/09/2022 10:18 AM

## 2022-07-09 NOTE — Patient Instructions (Signed)
[facility]  Discharge Instructions: Thank you for choosing [facility] to provide your oncology and hematology care.  If you have a lab appointment with the Cancer Center, please come in thru the Main Entrance and check in at the main information desk.  Wear comfortable clothing and clothing appropriate for easy access to any Portacath or PICC line.   We strive to give you quality time with your provider. You may need to reschedule your appointment if you arrive late (15 or more minutes).  Arriving late affects you and other patients whose appointments are after yours.  Also, if you miss three or more appointments without notifying the office, you may be dismissed from the clinic at the provider's discretion.      For prescription refill requests, have your pharmacy contact our office and allow 72 hours for refills to be completed.     To help prevent nausea and vomiting after your treatment, we encourage you to take your nausea medication as directed.  BELOW ARE SYMPTOMS THAT SHOULD BE REPORTED IMMEDIATELY: *FEVER GREATER THAN 100.4 F (38 C) OR HIGHER *CHILLS OR SWEATING *NAUSEA AND VOMITING THAT IS NOT CONTROLLED WITH YOUR NAUSEA MEDICATION *UNUSUAL SHORTNESS OF BREATH *UNUSUAL BRUISING OR BLEEDING *URINARY PROBLEMS (pain or burning when urinating, or frequent urination) *BOWEL PROBLEMS (unusual diarrhea, constipation, pain near the anus) TENDERNESS IN MOUTH AND THROAT WITH OR WITHOUT PRESENCE OF ULCERS (sore throat, sores in mouth, or a toothache) UNUSUAL RASH, SWELLING OR PAIN  UNUSUAL VAGINAL DISCHARGE OR ITCHING   Items with * indicate a potential emergency and should be followed up as soon as possible or go to the Emergency Department if any problems should occur.  Please show the CHEMOTHERAPY ALERT CARD or IMMUNOTHERAPY ALERT CARD at check-in to the Emergency Department and triage nurse.  Should you have questions after your visit or need to  cancel or reschedule your appointment, please contact [facility] [phone]  and follow the prompts.  Office hours are 8:00 a.m. to 4:30 p.m. Monday - Friday. Please note that voicemails left after 4:00 p.m. may not be returned until the following business day.  We are closed weekends and major holidays. You have access to a nurse at all times for urgent questions. Please call the main number to the clinic [phone] and follow the prompts.  For any non-urgent questions, you may also contact your provider using MyChart. We now offer e-Visits for anyone 18 and older to request care online for non-urgent symptoms. For details visit [email or web address].   Also download the MyChart app! Go to the app store, search "MyChart", open the app, select [facility], and log in with your MyChart username and password.  Masks are optional in the cancer centers. If you would like for your care team to wear a mask while they are taking care of you, please let them know. You may have one support person who is at least 53 years old accompany you for your appointments.  

## 2022-07-09 NOTE — Patient Instructions (Signed)
[place] at [facility] Discharge Instructions  You were seen today by Tarri Abernethy PA-C for your anemia.  ANEMIA:  We suspect that your anemia is related to iron deficiency from chronic blood loss.  We will schedule you for IV iron x3 doses.   VITAMIN B12 DEFICIENCY: We will schedule you for vitamin B12 injections once a month.  FOLLOW-UP APPOINTMENT: We will recheck your labs in 3 months and see you for office visit 1 week after labs.   - - - - - - - - - - - - - - - - - -    Thank you for choosing [place] at [facility] to provide your oncology and hematology care.  To afford each patient quality time with our provider, please arrive at least 15 minutes before your scheduled appointment time.   If you have a lab appointment with the [facility] please come in thru the Main Entrance and check in at the main information desk.  You need to re-schedule your appointment should you arrive 10 or more minutes late.  We strive to give you quality time with our providers, and arriving late affects you and other patients whose appointments are after yours.  Also, if you no show three or more times for appointments you may be dismissed from the clinic at the providers discretion.     Again, thank you for choosing [facility].  Our hope is that these requests will decrease the amount of time that you wait before being seen by our physicians.       _____________________________________________________________  Should you have questions after your visit to [facility], please contact our office at [phone] and follow the prompts.  Our office hours are 8:00 a.m. and 4:30 p.m. Monday - Friday.  Please note that voicemails left after 4:00 p.m. may not be returned until the following business day.  We are closed weekends and major holidays.  You do have access to a nurse 24-7, just call the main number to the clinic  [phone] and do not press any options, hold on the line and a nurse will answer the phone.    For prescription refill requests, have your pharmacy contact our office and allow 72 hours.    Due to Covid, you will need to wear a mask upon entering the hospital. If you do not have a mask, a mask will be given to you at the Main Entrance upon arrival. For doctor visits, patients may have 1 support person age 24 or older with them. For treatment visits, patients can not have anyone with them due to social distancing guidelines and our immunocompromised population.

## 2022-07-09 NOTE — Progress Notes (Signed)
Patient tolerated injection with no complaints voiced.  Site clean and dry with no bruising or swelling noted at site.  See MAR for details.  Band aid applied.  Patient stable during and after injection.  Vss with discharge and left in satisfactory condition with no s/s of distress noted.  

## 2022-07-30 ENCOUNTER — Inpatient Hospital Stay: Payer: Medicare Other | Attending: Hematology

## 2022-07-30 VITALS — BP 110/69 | HR 92 | Temp 98.0°F | Resp 18

## 2022-07-30 DIAGNOSIS — E538 Deficiency of other specified B group vitamins: Secondary | ICD-10-CM | POA: Diagnosis not present

## 2022-07-30 DIAGNOSIS — D500 Iron deficiency anemia secondary to blood loss (chronic): Secondary | ICD-10-CM

## 2022-07-30 DIAGNOSIS — Z79899 Other long term (current) drug therapy: Secondary | ICD-10-CM | POA: Insufficient documentation

## 2022-07-30 MED ORDER — ACETAMINOPHEN 325 MG PO TABS
650.0000 mg | ORAL_TABLET | Freq: Once | ORAL | Status: AC
  Administered 2022-07-30: 650 mg via ORAL
  Filled 2022-07-30: qty 2

## 2022-07-30 MED ORDER — FAMOTIDINE IN NACL 20-0.9 MG/50ML-% IV SOLN
20.0000 mg | Freq: Once | INTRAVENOUS | Status: AC
  Administered 2022-07-30: 20 mg via INTRAVENOUS
  Filled 2022-07-30: qty 50

## 2022-07-30 MED ORDER — LORATADINE 10 MG PO TABS
10.0000 mg | ORAL_TABLET | Freq: Once | ORAL | Status: AC
  Administered 2022-07-30: 10 mg via ORAL
  Filled 2022-07-30: qty 1

## 2022-07-30 MED ORDER — SODIUM CHLORIDE 0.9 % IV SOLN: Freq: Once | INTRAVENOUS | Status: AC

## 2022-07-30 MED ORDER — SODIUM CHLORIDE 0.9 % IV SOLN
300.0000 mg | Freq: Once | INTRAVENOUS | Status: AC
  Administered 2022-07-30: 300 mg via INTRAVENOUS
  Filled 2022-07-30: qty 300

## 2022-07-30 NOTE — Progress Notes (Signed)
Patient presents today for Venofer infusion per providers order.  Vital signs WNL.  [place] has no new complaints at this time.  Peripheral IV started and blood return noted pre and post infusion.  Treatment given today per MD orders.  Stable during infusion without adverse affects.  Vital signs stable.  No complaints at this time.  Discharge from clinic ambulatory in stable condition.  Alert and oriented X 3.  Follow up with [facility] as scheduled.

## 2022-07-30 NOTE — Patient Instructions (Signed)
[facility]  Discharge Instructions: Thank you for choosing [facility] to provide your oncology and hematology care.  If you have a lab appointment with the Cancer Center, please come in thru the Main Entrance and check in at the main information desk.  Wear comfortable clothing and clothing appropriate for easy access to any Portacath or PICC line.   We strive to give you quality time with your provider. You may need to reschedule your appointment if you arrive late (15 or more minutes).  Arriving late affects you and other patients whose appointments are after yours.  Also, if you miss three or more appointments without notifying the office, you may be dismissed from the clinic at the provider's discretion.      For prescription refill requests, have your pharmacy contact our office and allow 72 hours for refills to be completed.    Today you received the following chemotherapy and/or immunotherapy agents Venofer      To help prevent nausea and vomiting after your treatment, we encourage you to take your nausea medication as directed.  BELOW ARE SYMPTOMS THAT SHOULD BE REPORTED IMMEDIATELY: *FEVER GREATER THAN 100.4 F (38 C) OR HIGHER *CHILLS OR SWEATING *NAUSEA AND VOMITING THAT IS NOT CONTROLLED WITH YOUR NAUSEA MEDICATION *UNUSUAL SHORTNESS OF BREATH *UNUSUAL BRUISING OR BLEEDING *URINARY PROBLEMS (pain or burning when urinating, or frequent urination) *BOWEL PROBLEMS (unusual diarrhea, constipation, pain near the anus) TENDERNESS IN MOUTH AND THROAT WITH OR WITHOUT PRESENCE OF ULCERS (sore throat, sores in mouth, or a toothache) UNUSUAL RASH, SWELLING OR PAIN  UNUSUAL VAGINAL DISCHARGE OR ITCHING   Items with * indicate a potential emergency and should be followed up as soon as possible or go to the Emergency Department if any problems should occur.  Please show the CHEMOTHERAPY ALERT CARD or IMMUNOTHERAPY ALERT CARD at check-in to the Emergency  Department and triage nurse.  Should you have questions after your visit or need to cancel or reschedule your appointment, please contact [facility] AT  [phone]  and follow the prompts.  Office hours are 8:00 a.m. to 4:30 p.m. Monday - Friday. Please note that voicemails left after 4:00 p.m. may not be returned until the following business day.  We are closed weekends and major holidays. You have access to a nurse at all times for urgent questions. Please call the main number to the clinic [phone] and follow the prompts.  For any non-urgent questions, you may also contact your provider using MyChart. We now offer e-Visits for anyone 18 and older to request care online for non-urgent symptoms. For details visit [email or web address].   Also download the MyChart app! Go to the app store, search "MyChart", open the app, select [facility], and log in with your MyChart username and password.   

## 2022-07-31 ENCOUNTER — Ambulatory Visit: Payer: Medicare Other

## 2022-08-07 ENCOUNTER — Ambulatory Visit: Payer: Medicare Other

## 2022-08-11 ENCOUNTER — Inpatient Hospital Stay: Payer: Medicare Other

## 2022-08-11 VITALS — BP 111/66 | HR 89 | Temp 98.1°F | Resp 17

## 2022-08-11 DIAGNOSIS — E538 Deficiency of other specified B group vitamins: Secondary | ICD-10-CM | POA: Diagnosis not present

## 2022-08-11 DIAGNOSIS — D500 Iron deficiency anemia secondary to blood loss (chronic): Secondary | ICD-10-CM

## 2022-08-11 MED ORDER — CYANOCOBALAMIN 1000 MCG/ML IJ SOLN: 1000.0000 ug | Freq: Once | INTRAMUSCULAR | Status: DC

## 2022-08-11 MED ORDER — CYANOCOBALAMIN 1000 MCG/ML IJ SOLN
1000.0000 ug | Freq: Once | INTRAMUSCULAR | Status: AC
  Administered 2022-08-11: 1000 ug via INTRAMUSCULAR
  Filled 2022-08-11: qty 1

## 2022-08-11 MED ORDER — SODIUM CHLORIDE 0.9 % IV SOLN: Freq: Once | INTRAVENOUS | Status: AC

## 2022-08-11 MED ORDER — FAMOTIDINE IN NACL 20-0.9 MG/50ML-% IV SOLN
20.0000 mg | Freq: Once | INTRAVENOUS | Status: AC
  Administered 2022-08-11: 20 mg via INTRAVENOUS
  Filled 2022-08-11: qty 50

## 2022-08-11 MED ORDER — LORATADINE 10 MG PO TABS
10.0000 mg | ORAL_TABLET | Freq: Once | ORAL | Status: AC
  Administered 2022-08-11: 10 mg via ORAL
  Filled 2022-08-11: qty 1

## 2022-08-11 MED ORDER — SODIUM CHLORIDE 0.9 % IV SOLN
300.0000 mg | Freq: Once | INTRAVENOUS | Status: AC
  Administered 2022-08-11: 300 mg via INTRAVENOUS
  Filled 2022-08-11: qty 300

## 2022-08-11 MED ORDER — ACETAMINOPHEN 325 MG PO TABS
650.0000 mg | ORAL_TABLET | Freq: Once | ORAL | Status: AC
  Administered 2022-08-11: 650 mg via ORAL
  Filled 2022-08-11: qty 2

## 2022-08-11 NOTE — Progress Notes (Signed)
Patient presents today for Venofer infusion. Patient is in satisfactory condition with no new complaints voiced.  Vital signs are stable.  We will proceed with infusion per provider orders.

## 2022-08-11 NOTE — Progress Notes (Signed)
Patient tolerated Vitamin B 12 injection with no complaints voiced.  Site clean and dry with no bruising or swelling noted.  No complaints of pain.  Discharged with vital signs stable and no signs or symptoms of distress noted.   ?

## 2022-08-11 NOTE — Progress Notes (Signed)
Venofer infusion given per orders. Patient tolerated it well without problems. Vitals stable and discharged home from clinic ambulatory. Follow up as scheduled.

## 2022-08-11 NOTE — Patient Instructions (Signed)
[facility]  Discharge Instructions: Thank you for choosing [facility] to provide your oncology and hematology care.  If you have a lab appointment with the [facility], please come in thru the Main Entrance and check in at the main information desk.  Wear comfortable clothing and clothing appropriate for easy access to any Portacath or PICC line.   We strive to give you quality time with your provider. You may need to reschedule your appointment if you arrive late (15 or more minutes).  Arriving late affects you and other patients whose appointments are after yours.  Also, if you miss three or more appointments without notifying the office, you may be dismissed from the clinic at the provider's discretion.      For prescription refill requests, have your pharmacy contact our office and allow 72 hours for refills to be completed.    Today you received an iron infusion, venofer   To help prevent nausea and vomiting after your treatment, we encourage you to take your nausea medication as directed.  BELOW ARE SYMPTOMS THAT SHOULD BE REPORTED IMMEDIATELY: *FEVER GREATER THAN 100.4 F (38 C) OR HIGHER *CHILLS OR SWEATING *NAUSEA AND VOMITING THAT IS NOT CONTROLLED WITH YOUR NAUSEA MEDICATION *UNUSUAL SHORTNESS OF BREATH *UNUSUAL BRUISING OR BLEEDING *URINARY PROBLEMS (pain or burning when urinating, or frequent urination) *BOWEL PROBLEMS (unusual diarrhea, constipation, pain near the anus) TENDERNESS IN MOUTH AND THROAT WITH OR WITHOUT PRESENCE OF ULCERS (sore throat, sores in mouth, or a toothache) UNUSUAL RASH, SWELLING OR PAIN  UNUSUAL VAGINAL DISCHARGE OR ITCHING   Items with * indicate a potential emergency and should be followed up as soon as possible or go to the Emergency Department if any problems should occur.  Please show the CHEMOTHERAPY ALERT CARD or IMMUNOTHERAPY ALERT CARD at check-in to the Emergency Department and triage nurse.  Should  you have questions after your visit or need to cancel or reschedule your appointment, please contact [facility] [phone]  and follow the prompts.  Office hours are 8:00 a.m. to 4:30 p.m. Monday - Friday. Please note that voicemails left after 4:00 p.m. may not be returned until the following business day.  We are closed weekends and major holidays. You have access to a nurse at all times for urgent questions. Please call the main number to the clinic [phone] and follow the prompts.  For any non-urgent questions, you may also contact your provider using MyChart. We now offer e-Visits for anyone 42 and older to request care online for non-urgent symptoms. For details visit [email or web address].   Also download the MyChart app! Go to the app store, search "MyChart", open the app, select [facility], and log in with your MyChart username and password.

## 2022-08-11 NOTE — Patient Instructions (Signed)
[facility]  Discharge Instructions: Thank you for choosing [facility] to provide your oncology and hematology care.  If you have a lab appointment with the Cancer Center, please come in thru the Main Entrance and check in at the main information desk.  Wear comfortable clothing and clothing appropriate for easy access to any Portacath or PICC line.   We strive to give you quality time with your provider. You may need to reschedule your appointment if you arrive late (15 or more minutes).  Arriving late affects you and other patients whose appointments are after yours.  Also, if you miss three or more appointments without notifying the office, you may be dismissed from the clinic at the provider's discretion.      For prescription refill requests, have your pharmacy contact our office and allow 72 hours for refills to be completed.    Vitamin B12 Injection What is this medication? Vitamin B12 (VAHY tuh min B12) prevents and treats low vitamin B12 levels in your body. It is used in people who do not get enough vitamin B12 from their diet or when their digestive tract does not absorb enough. Vitamin B12 plays an important role in maintaining the health of your nervous system and red blood cells. This medicine may be used for other purposes; ask your health care provider or pharmacist if you have questions. COMMON BRAND NAME(S): B-12 Compliance Kit, B-12 Injection Kit, Cyomin, Dodex, LA-12, Nutri-Twelve, Physicians EZ Use B-12, Primabalt What should I tell my care team before I take this medication? They need to know if you have any of these conditions: Kidney disease Leber's disease Megaloblastic anemia An unusual or allergic reaction to cyanocobalamin, cobalt, other medications, foods, dyes, or preservatives Pregnant or trying to get pregnant Breast-feeding How should I use this medication? This medication is injected into a muscle or deeply under the skin.  It is usually given in a clinic or care team's office. However, your care team may teach you how to inject yourself. Follow all instructions. Talk to your care team about the use of this medication in children. Special care may be needed. Overdosage: If you think you have taken too much of this medicine contact a poison control center or emergency room at once. NOTE: This medicine is only for you. Do not share this medicine with others. What if I miss a dose? If you are given your dose at a clinic or care team's office, call to reschedule your appointment. If you give your own injections, and you miss a dose, take it as soon as you can. If it is almost time for your next dose, take only that dose. Do not take double or extra doses. What may interact with this medication? Alcohol Colchicine This list may not describe all possible interactions. Give your health care provider a list of all the medicines, herbs, non-prescription drugs, or dietary supplements you use. Also tell them if you smoke, drink alcohol, or use illegal drugs. Some items may interact with your medicine. What should I watch for while using this medication? Visit your care team regularly. You may need blood work done while you are taking this medication. You may need to follow a special diet. Talk to your care team. Limit your alcohol intake and avoid smoking to get the best benefit. What side effects may I notice from receiving this medication? Side effects that you should report to your care team as soon as possible: Allergic reactions--skin rash,   itching, hives, swelling of the face, lips, tongue, or throat Swelling of the ankles, hands, or feet Trouble breathing Side effects that usually do not require medical attention (report to your care team if they continue or are bothersome): Diarrhea This list may not describe all possible side effects. Call your doctor for medical advice about side effects. You may report side effects  to FDA at 1-800-FDA-1088. Where should I keep my medication? Keep out of the reach of children. Store at room temperature between 15 and 30 degrees C (59 and 85 degrees F). Protect from light. Throw away any unused medication after the expiration date. NOTE: This sheet is a summary. It may not cover all possible information. If you have questions about this medicine, talk to your doctor, pharmacist, or health care provider.  2023 Elsevier/Gold Standard (2007-09-03 00:00:00)    To help prevent nausea and vomiting after your treatment, we encourage you to take your nausea medication as directed.  BELOW ARE SYMPTOMS THAT SHOULD BE REPORTED IMMEDIATELY: *FEVER GREATER THAN 100.4 F (38 C) OR HIGHER *CHILLS OR SWEATING *NAUSEA AND VOMITING THAT IS NOT CONTROLLED WITH YOUR NAUSEA MEDICATION *UNUSUAL SHORTNESS OF BREATH *UNUSUAL BRUISING OR BLEEDING *URINARY PROBLEMS (pain or burning when urinating, or frequent urination) *BOWEL PROBLEMS (unusual diarrhea, constipation, pain near the anus) TENDERNESS IN MOUTH AND THROAT WITH OR WITHOUT PRESENCE OF ULCERS (sore throat, sores in mouth, or a toothache) UNUSUAL RASH, SWELLING OR PAIN  UNUSUAL VAGINAL DISCHARGE OR ITCHING   Items with * indicate a potential emergency and should be followed up as soon as possible or go to the Emergency Department if any problems should occur.  Please show the CHEMOTHERAPY ALERT CARD or IMMUNOTHERAPY ALERT CARD at check-in to the Emergency Department and triage nurse.  Should you have questions after your visit or need to cancel or reschedule your appointment, please contact [facility] [phone]  and follow the prompts.  Office hours are 8:00 a.m. to 4:30 p.m. Monday - Friday. Please note that voicemails left after 4:00 p.m. may not be returned until the following business day.  We are closed weekends and major holidays. You have access to a nurse at all times for urgent questions. Please call  the main number to the clinic [phone] and follow the prompts.  For any non-urgent questions, you may also contact your provider using MyChart. We now offer e-Visits for anyone 18 and older to request care online for non-urgent symptoms. For details visit [email or web address].   Also download the MyChart app! Go to the app store, search "MyChart", open the app, select , and log in with your MyChart username and password.   

## 2022-08-14 ENCOUNTER — Ambulatory Visit: Payer: Medicare Other

## 2022-08-18 ENCOUNTER — Inpatient Hospital Stay: Payer: Medicare Other

## 2022-08-18 VITALS — BP 106/69 | HR 91 | Temp 97.3°F | Resp 18

## 2022-08-18 DIAGNOSIS — E538 Deficiency of other specified B group vitamins: Secondary | ICD-10-CM | POA: Diagnosis not present

## 2022-08-18 DIAGNOSIS — D500 Iron deficiency anemia secondary to blood loss (chronic): Secondary | ICD-10-CM

## 2022-08-18 MED ORDER — SODIUM CHLORIDE 0.9 % IV SOLN: Freq: Once | INTRAVENOUS | Status: AC

## 2022-08-18 MED ORDER — SODIUM CHLORIDE 0.9 % IV SOLN
300.0000 mg | Freq: Once | INTRAVENOUS | Status: AC
  Administered 2022-08-18: 300 mg via INTRAVENOUS
  Filled 2022-08-18: qty 300

## 2022-08-18 MED ORDER — ACETAMINOPHEN 325 MG PO TABS
650.0000 mg | ORAL_TABLET | Freq: Once | ORAL | Status: AC
  Administered 2022-08-18: 650 mg via ORAL
  Filled 2022-08-18: qty 2

## 2022-08-18 MED ORDER — FAMOTIDINE IN NACL 20-0.9 MG/50ML-% IV SOLN
20.0000 mg | Freq: Once | INTRAVENOUS | Status: AC
  Administered 2022-08-18: 20 mg via INTRAVENOUS
  Filled 2022-08-18: qty 50

## 2022-08-18 MED ORDER — LORATADINE 10 MG PO TABS
10.0000 mg | ORAL_TABLET | Freq: Once | ORAL | Status: AC
  Administered 2022-08-18: 10 mg via ORAL
  Filled 2022-08-18: qty 1

## 2022-08-18 NOTE — Patient Instructions (Signed)
[facility]  Discharge Instructions: Thank you for choosing [facility] to provide your oncology and hematology care.  If you have a lab appointment with the Cancer Center, please come in thru the Main Entrance and check in at the main information desk.  Wear comfortable clothing and clothing appropriate for easy access to any Portacath or PICC line.   We strive to give you quality time with your provider. You may need to reschedule your appointment if you arrive late (15 or more minutes).  Arriving late affects you and other patients whose appointments are after yours.  Also, if you miss three or more appointments without notifying the office, you may be dismissed from the clinic at the provider's discretion.      For prescription refill requests, have your pharmacy contact our office and allow 72 hours for refills to be completed.    Today you received the following chemotherapy and/or immunotherapy agents Venofer      To help prevent nausea and vomiting after your treatment, we encourage you to take your nausea medication as directed.  BELOW ARE SYMPTOMS THAT SHOULD BE REPORTED IMMEDIATELY: *FEVER GREATER THAN 100.4 F (38 C) OR HIGHER *CHILLS OR SWEATING *NAUSEA AND VOMITING THAT IS NOT CONTROLLED WITH YOUR NAUSEA MEDICATION *UNUSUAL SHORTNESS OF BREATH *UNUSUAL BRUISING OR BLEEDING *URINARY PROBLEMS (pain or burning when urinating, or frequent urination) *BOWEL PROBLEMS (unusual diarrhea, constipation, pain near the anus) TENDERNESS IN MOUTH AND THROAT WITH OR WITHOUT PRESENCE OF ULCERS (sore throat, sores in mouth, or a toothache) UNUSUAL RASH, SWELLING OR PAIN  UNUSUAL VAGINAL DISCHARGE OR ITCHING   Items with * indicate a potential emergency and should be followed up as soon as possible or go to the Emergency Department if any problems should occur.  Please show the CHEMOTHERAPY ALERT CARD or IMMUNOTHERAPY ALERT CARD at check-in to the Emergency  Department and triage nurse.  Should you have questions after your visit or need to cancel or reschedule your appointment, please contact [facility] [phone]  and follow the prompts.  Office hours are 8:00 a.m. to 4:30 p.m. Monday - Friday. Please note that voicemails left after 4:00 p.m. may not be returned until the following business day.  We are closed weekends and major holidays. You have access to a nurse at all times for urgent questions. Please call the main number to the clinic [phone] and follow the prompts.  For any non-urgent questions, you may also contact your provider using MyChart. We now offer e-Visits for anyone 18 and older to request care online for non-urgent symptoms. For details visit [email or web address].   Also download the MyChart app! Go to the app store, search "MyChart", open the app, select [facility], and log in with your MyChart username and password.   

## 2022-08-18 NOTE — Progress Notes (Signed)
Patient presents today for Venofer infusion per providers order.  Vital signs WNL.    Peripheral IV started and blood return noted pre and post infusion.  Stable during infusion without adverse affects.  Vital signs stable.  Patient complaining that her lower stomach is cramping, infusion is complete, MD aware that patient sometimes has abdominal pain after her Venofer.  Patient advised to Take NSAIDS or Tylenol for mild pain at home per MD.  Patient instructed that if she has worsening pain, trouble breathing, swelling or hives to go to the emergency room.  Patient states that her abdominal pain is better and that it comes and goes.  Discharge from clinic ambulatory in stable condition.  Alert and oriented X 3.  Follow up with [facility] as scheduled.

## 2022-08-18 NOTE — Progress Notes (Signed)
The patient is saying during her iron infusions she sometimes has lower abdominal pain like cramps and when she goes home after the infusion. She does have a history of back pain and does hurt more during and after the infusion and when she goes home after an iron infusion. Ok to give iron infusion today verbal order Dr. Delton Coombes.

## 2022-09-04 ENCOUNTER — Ambulatory Visit: Payer: Medicare Other

## 2022-09-08 ENCOUNTER — Inpatient Hospital Stay: Payer: Medicare Other | Attending: Hematology

## 2022-09-08 VITALS — BP 121/71 | HR 99 | Temp 98.5°F | Resp 18

## 2022-09-08 DIAGNOSIS — E538 Deficiency of other specified B group vitamins: Secondary | ICD-10-CM | POA: Insufficient documentation

## 2022-09-08 DIAGNOSIS — D500 Iron deficiency anemia secondary to blood loss (chronic): Secondary | ICD-10-CM

## 2022-09-08 MED ORDER — CYANOCOBALAMIN 1000 MCG/ML IJ SOLN
1000.0000 ug | Freq: Once | INTRAMUSCULAR | Status: AC
  Administered 2022-09-08: 1000 ug via INTRAMUSCULAR
  Filled 2022-09-08: qty 1

## 2022-09-08 NOTE — Progress Notes (Signed)
[place] presents today for B12 injection per the provider's orders.  Stable during administration without incident; injection site WNL; see MAR for injection details.  Patient tolerated procedure well and without incident.  No questions or complaints noted at this time.

## 2022-09-08 NOTE — Patient Instructions (Signed)
[facility]  Discharge Instructions: Thank you for choosing [facility] to provide your oncology and hematology care.  If you have a lab appointment with the [facility], please come in thru the Main Entrance and check in at the main information desk.  Wear comfortable clothing and clothing appropriate for easy access to any Portacath or PICC line.   We strive to give you quality time with your provider. You may need to reschedule your appointment if you arrive late (15 or more minutes).  Arriving late affects you and other patients whose appointments are after yours.  Also, if you miss three or more appointments without notifying the office, you may be dismissed from the clinic at the provider's discretion.      For prescription refill requests, have your pharmacy contact our office and allow 72 hours for refills to be completed.    Today you received the following chemotherapy and/or immunotherapy agents B12      To help prevent nausea and vomiting after your treatment, we encourage you to take your nausea medication as directed.  BELOW ARE SYMPTOMS THAT SHOULD BE REPORTED IMMEDIATELY: *FEVER GREATER THAN 100.4 F (38 C) OR HIGHER *CHILLS OR SWEATING *NAUSEA AND VOMITING THAT IS NOT CONTROLLED WITH YOUR NAUSEA MEDICATION *UNUSUAL SHORTNESS OF BREATH *UNUSUAL BRUISING OR BLEEDING *URINARY PROBLEMS (pain or burning when urinating, or frequent urination) *BOWEL PROBLEMS (unusual diarrhea, constipation, pain near the anus) TENDERNESS IN MOUTH AND THROAT WITH OR WITHOUT PRESENCE OF ULCERS (sore throat, sores in mouth, or a toothache) UNUSUAL RASH, SWELLING OR PAIN  UNUSUAL VAGINAL DISCHARGE OR ITCHING   Items with * indicate a potential emergency and should be followed up as soon as possible or go to the Emergency Department if any problems should occur.  Please show the CHEMOTHERAPY ALERT CARD or IMMUNOTHERAPY ALERT CARD at check-in to the Emergency  Department and triage nurse.  Should you have questions after your visit or need to cancel or reschedule your appointment, please contact [facility] [phone]  and follow the prompts.  Office hours are 8:00 a.m. to 4:30 p.m. Monday - Friday. Please note that voicemails left after 4:00 p.m. may not be returned until the following business day.  We are closed weekends and major holidays. You have access to a nurse at all times for urgent questions. Please call the main number to the clinic [phone] and follow the prompts.  For any non-urgent questions, you may also contact your provider using MyChart. We now offer e-Visits for anyone 45 and older to request care online for non-urgent symptoms. For details visit [email or web address].   Also download the MyChart app! Go to the app store, search "MyChart", open the app, select [facility], and log in with your MyChart username and password.

## 2022-10-02 ENCOUNTER — Ambulatory Visit: Payer: Medicare Other

## 2022-10-02 ENCOUNTER — Other Ambulatory Visit: Payer: Medicare Other

## 2022-10-06 ENCOUNTER — Inpatient Hospital Stay: Payer: Medicare Other | Attending: Hematology

## 2022-10-06 ENCOUNTER — Inpatient Hospital Stay: Payer: Medicare Other

## 2022-10-06 VITALS — BP 98/65 | HR 93 | Temp 97.4°F | Resp 18

## 2022-10-06 DIAGNOSIS — D500 Iron deficiency anemia secondary to blood loss (chronic): Secondary | ICD-10-CM

## 2022-10-06 DIAGNOSIS — D649 Anemia, unspecified: Secondary | ICD-10-CM | POA: Insufficient documentation

## 2022-10-06 DIAGNOSIS — E538 Deficiency of other specified B group vitamins: Secondary | ICD-10-CM

## 2022-10-06 LAB — CBC WITH DIFFERENTIAL/PLATELET
Abs Immature Granulocytes: 0.05 10*3/uL (ref 0.00–0.07)
Basophils Absolute: 0 10*3/uL (ref 0.0–0.1)
Basophils Relative: 0 %
Eosinophils Absolute: 0.6 10*3/uL — ABNORMAL HIGH (ref 0.0–0.5)
Eosinophils Relative: 6 %
HCT: 37.3 % (ref 36.0–46.0)
Hemoglobin: 12.4 g/dL (ref 12.0–15.0)
Immature Granulocytes: 1 %
Lymphocytes Relative: 25 %
Lymphs Abs: 2.6 10*3/uL (ref 0.7–4.0)
MCH: 34.3 pg — ABNORMAL HIGH (ref 26.0–34.0)
MCHC: 33.2 g/dL (ref 30.0–36.0)
MCV: 103 fL — ABNORMAL HIGH (ref 80.0–100.0)
Monocytes Absolute: 0.7 10*3/uL (ref 0.1–1.0)
Monocytes Relative: 6 %
Neutro Abs: 6.3 10*3/uL (ref 1.7–7.7)
Neutrophils Relative %: 62 %
Platelets: 204 10*3/uL (ref 150–400)
RBC: 3.62 MIL/uL — ABNORMAL LOW (ref 3.87–5.11)
RDW: 15.7 % — ABNORMAL HIGH (ref 11.5–15.5)
WBC: 10.3 10*3/uL (ref 4.0–10.5)
nRBC: 0 % (ref 0.0–0.2)

## 2022-10-06 LAB — IRON AND TIBC
Iron: 57 ug/dL (ref 28–170)
Saturation Ratios: 23 % (ref 10.4–31.8)
TIBC: 253 ug/dL (ref 250–450)
UIBC: 196 ug/dL

## 2022-10-06 LAB — FERRITIN: Ferritin: 99 ng/mL (ref 11–307)

## 2022-10-06 LAB — VITAMIN B12: Vitamin B-12: 399 pg/mL (ref 180–914)

## 2022-10-06 MED ORDER — CYANOCOBALAMIN 1000 MCG/ML IJ SOLN
1000.0000 ug | Freq: Once | INTRAMUSCULAR | Status: AC
  Administered 2022-10-06: 1000 ug via INTRAMUSCULAR
  Filled 2022-10-06: qty 1

## 2022-10-06 NOTE — Progress Notes (Signed)
[place] presents today for injection per the provider's orders.  B12 administration without incident; injection site WNL; see MAR for injection details.  Patient tolerated procedure well and without incident.  No questions or complaints noted at this time.

## 2022-10-06 NOTE — Patient Instructions (Signed)
[name]  Discharge Instructions: Thank you for choosing [facility] to provide your oncology and hematology care.  If you have a lab appointment with the [facility], please come in thru the Main Entrance and check in at the main information desk.  Wear comfortable clothing and clothing appropriate for easy access to any Portacath or PICC line.   We strive to give you quality time with your provider. You may need to reschedule your appointment if you arrive late (15 or more minutes).  Arriving late affects you and other patients whose appointments are after yours.  Also, if you miss three or more appointments without notifying the office, you may be dismissed from the clinic at the provider's discretion.      For prescription refill requests, have your pharmacy contact our office and allow 72 hours for refills to be completed.    Today you received the following chemotherapy and/or immunotherapy agents B12. Vitamin B12 Capsules or Tablets What is this medication? VITAMIN B12 (VAHY tuh min B12) prevents and treats low vitamin B12 levels in your body. It is used in people who do not get enough vitamin B12 from their diet or when their digestive tract does not absorb enough. Vitamin B12 plays an important role in maintaining the health of your nervous system and red blood cells. This medicine may be used for other purposes; ask your health care provider or pharmacist if you have questions. What should I tell my care team before I take this medication? They need to know if you have any of these conditions: Anemia Kidney disease Leber's disease Malabsorption disorder An unusual or allergic reaction to cyanocobalamin, cobalt, other medications, foods, dyes, or preservatives Pregnant or trying to get pregnant Breast-feeding How should I use this medication? Take this medication by mouth with a glass of water. Follow the directions on the package or  prescription label. If you are taking the tablets, do not chew, cut, or crush this medication. If using a vitamin solution, use a specially marked spoon or dropper to measure each dose. Ask your pharmacist if you do not have one. Household spoons are not accurate. For best results take this vitamin with food. Take your medication at regular intervals. Do not take your medication more often than directed. Talk to your care team about the use of this medication in children. While this medication may be prescribed for selected conditions, precautions do apply. Overdosage: If you think you have taken too much of this medicine contact a poison control center or emergency room at once. NOTE: This medicine is only for you. Do not share this medicine with others. What if I miss a dose? If you miss a dose, take it as soon as you can. If it is almost time for your next dose, take only that dose. Do not take double or extra doses. What may interact with this medication? Alcohol Aminosalicylic acid Colchicine Medications that suppress your bone marrow, such as chemotherapy, chloramphenicol This list may not describe all possible interactions. Give your health care provider a list of all the medicines, herbs, non-prescription drugs, or dietary supplements you use. Also tell them if you smoke, drink alcohol, or use illegal drugs. Some items may interact with your medicine. What should I watch for while using this medication? Follow a healthy diet. Taking a vitamin supplement does not replace the need for a balanced diet. Some foods that have vitamin B12 naturally are fish, seafood, egg yolk, milk,  and fermented cheese. Too much of this vitamin can be unsafe. Talk to your care team about how much is right for you. What side effects may I notice from receiving this medication? Side effects that you should report to your care team as soon as possible: Allergic reactions--skin rash, itching, hives, swelling of the  face, lips, tongue, or throat Side effects that usually do not require medical attention (report to your care team if they continue or are bothersome): Diarrhea Fatigue Headache Nausea This list may not describe all possible side effects. Call your doctor for medical advice about side effects. You may report side effects to FDA at 1-800-FDA-1088. Where should I keep my medication? Keep out of the reach of children and pets. Store at room temperature between 15 and 30 degrees C (59 and 85 degrees F). Protect from heat and light. Throw away any unused medication after the expiration date. NOTE: This sheet is a summary. It may not cover all possible information. If you have questions about this medicine, talk to your doctor, pharmacist, or health care provider.  2023 Elsevier/Gold Standard (2020-10-18 00:00:00)       To help prevent nausea and vomiting after your treatment, we encourage you to take your nausea medication as directed.  BELOW ARE SYMPTOMS THAT SHOULD BE REPORTED IMMEDIATELY: *FEVER GREATER THAN 100.4 F (38 C) OR HIGHER *CHILLS OR SWEATING *NAUSEA AND VOMITING THAT IS NOT CONTROLLED WITH YOUR NAUSEA MEDICATION *UNUSUAL SHORTNESS OF BREATH *UNUSUAL BRUISING OR BLEEDING *URINARY PROBLEMS (pain or burning when urinating, or frequent urination) *BOWEL PROBLEMS (unusual diarrhea, constipation, pain near the anus) TENDERNESS IN MOUTH AND THROAT WITH OR WITHOUT PRESENCE OF ULCERS (sore throat, sores in mouth, or a toothache) UNUSUAL RASH, SWELLING OR PAIN  UNUSUAL VAGINAL DISCHARGE OR ITCHING   Items with * indicate a potential emergency and should be followed up as soon as possible or go to the Emergency Department if any problems should occur.  Please show the CHEMOTHERAPY ALERT CARD or IMMUNOTHERAPY ALERT CARD at check-in to the Emergency Department and triage nurse.  Should you have questions after your visit or need to cancel or reschedule your appointment, please contact  [facility] [phone]  and follow the prompts.  Office hours are 8:00 a.m. to 4:30 p.m. Monday - Friday. Please note that voicemails left after 4:00 p.m. may not be returned until the following business day.  We are closed weekends and major holidays. You have access to a nurse at all times for urgent questions. Please call the main number to the clinic [phone] and follow the prompts.  For any non-urgent questions, you may also contact your provider using MyChart. We now offer e-Visits for anyone 76 and older to request care online for non-urgent symptoms. For details visit [email or web address].   Also download the MyChart app! Go to the app store, search "MyChart", open the app, select [facility], and log in with your MyChart username and password.

## 2022-10-09 ENCOUNTER — Ambulatory Visit: Payer: Medicare Other | Admitting: Physician Assistant

## 2022-10-09 LAB — METHYLMALONIC ACID, SERUM: Methylmalonic Acid, Quantitative: 697 nmol/L — ABNORMAL HIGH (ref 0–378)

## 2022-10-12 NOTE — Progress Notes (Signed)
[place] [address] [place] [address]    Clinic Day:  10/12/2022  Referring physician: Redmond School, MD  Patient Care Team: Redmond School, MD as PCP - General (Internal Medicine) Gala Romney Cristopher Estimable, MD as Attending Physician (Gastroenterology) Verner Chol, MD as Consulting Physician (Sports Medicine)   ASSESSMENT & PLAN:   1.  Normocytic anemia - Seen  at the request of Delman Cheadle, PA-C/Lawrence Fusco MD [facility]) for evaluation of normocytic anemia. - Labs sent by PCP (12/25/2021) showed Hgb 9.9/MCV 84 with normal platelets 335, normal WBC 8.3.  She has normal kidney function with creatinine 0.80 and EGFR 88. - Additional lab review per EMR shows a longstanding history of intermittent anemia since at least 2009, with Hgb ranging from normal to as low as 6.4.  Previous labs (2020) have also shown iron deficiency. - Intermittent anemia for the past 10 years requiring multiple blood transfusions, most recent PRBC given February 2023 - History of GI bleeding from erosive esophagitis and small bowel AVMs.  Has had several hospitalizations and required multiple blood transfusions.  Follows with GI at [facility] (Dr. Steward Drone). - Intermittent episodes of hematemesis (last episode 1 week ago) and melena. - Symptomatic with fatigue, pica, lightheadedness, and chest pain -Taking iron supplement daily for the past several years, possibly some impaired absorption as she is also taking Protonix twice daily - Differential diagnosis favors iron deficiency anemia secondary to chronic GI blood loss. - PLAN: We will check repeat CBC and iron panel today.  We will check nutritional panel, SPEP, and full anemia panel to rule out other causes of normocytic anemia. - Recommend continued close follow-up with GI. - We will go ahead and schedule her for IV Venofer 300 mg x 3 (pending results of anemia panel) - Patient can stop taking iron  pill, as it has been ineffective in improving her iron stores and has been causing side effects of nausea and constipation. - RTC in 2 months for repeat CBC/iron panel   2.  Weight loss - Weight has been up and down over the past several years, and that overall she has lost about 50 pounds in the past 5 years.  She thinks that this may be related to her chronic pancreatitis as she has episodes where she is unable to eat. - CT scan of abdomen and pelvis at outside facility in November 2022 did not show any findings concerning for malignancy. - PLAN: We will continue to monitor weight at follow-up visits and if she has any persistent weight loss, we will consider additional work-up.   3.  Tobacco abuse - She has been smoking 1 PPD cigarettes x25 years - This patient meets criteria for low-dose CT lung cancer screening (age 108-80 with a 20+ pack year history,  current everyday smoker /OR/ quit < 15 years ago, no current signs or symptoms of lung cancer) - The shared decision making visit discussion included risks and benefits of screening, potential for follow-up, diagnostic testing for abnormal scans, potential for false positive tests, overdiagnosis, discussion about total radiation exposure - Patient stated willingness to undergo diagnostics and treatment as needed - Patient was counseled on smoking cessation to decrease the  risk of lung cancer, pulmonary disease, heart disease, and stroke - Patient has been referred to Lung Cancer Screening Nurse Coordinator for further scheduling of LDCT and for further resources regarding free nicotine replacement therapy and information about smoking cessation classes - PLAN:  We will schedule for LDCT chest and refer to nurse coordinator for annual lung cancer screening as long as she remains eligible.   4.  Other history - PMH: Chronic pancreatitis, chronic pain, hyperlipidemia, hypertension, hypothyroidism, COPD, diabetes mellitus, irritable bowel syndrome,  and sleep apnea. - SOCIAL: She is a current smoker, smoking 1 PPD for the last 25 years.  She denies any history of alcohol or illicit drugs.  She is disabled, but previously worked as a Presenter, broadcasting. - FAMILY: She denies any family history of blood disorders.  Her maternal grandfather had throat cancer.  Family history otherwise positive for coronary artery disease, hypertension, and diabetes.    No orders of the defined types were placed in this encounter.   I,Alexis Herring,acting as a Education administrator for [employer], MD.,have documented all relevant documentation on the behalf of Derek Jack, MD,as directed by  Derek Jack, MD while in the presence of Derek Jack, MD.   ***  Ubaldo Glassing Herring   3/18/20249:55 PM  CHIEF COMPLAINT:   Diagnosis: Normocytic anemia   Prior Therapy: ***  Current Therapy: IV iron infusions + B12 injections    INTERVAL HISTORY:   Milan is a 52 y.o. female presenting to clinic today for follow up of Normocytic anemia. She was last seen by me on 01/29/22. She was most recently seen by PA Pennington on 07/09/22.  Today, she states that she is doing well overall. Her appetite level is at ***%. Her energy level is at ***%. She denies any {oncneg:29081}.    PAST MEDICAL HISTORY:   Past Medical History: Past Medical History:  Diagnosis Date   Anemia    Anxiety    Asthma    Bipolar 1 disorder ([place])    Chronic abdominal pain    COPD (chronic obstructive pulmonary disease) ([place])    Depression    Diabetes mellitus    Diabetic neuropathy ([place]) 10/29/2017   Esophagitis    Hiatal hernia    Hyperlipidemia 02/03/2012   Iron deficiency anemia due to chronic blood loss 01/29/2022   Migraine    Neck pain 06/08/2012   Pancreatitis chronic Idiopathic   Treated by Dr. Newman Pies at [facility] in the past with celiac blocks.    Surgical History: Past Surgical History:  Procedure Laterality Date   ABDOMINAL HYSTERECTOMY      attempted colonoscopy  02/2017   Dr. Newman Pies: Poor prep, scope passed to mid transverse colon before colonoscopy aborted.  No significant findings noted to mid transverse colon.  Next colonoscopy in 2 years.   CELIAC PLEXUS BLOCK     CHOLECYSTECTOMY     COLONOSCOPY N/A 12/05/2012   [id number] rectum, colon and terminal ileum   ESOPHAGOGASTRODUODENOSCOPY  02/20/2008   [id number] distal esophageal mucosa, suspicious for neoplasm/Hiatal hernia otherwise normal    ESOPHAGOGASTRODUODENOSCOPY  04/03/2008   [id number] hiatal hernia, otherwise normal/Short, tight, benign-appearing peptic stricture   ESOPHAGOGASTRODUODENOSCOPY  November 16, 2012   Dr. Britta Mccreedy: hiatal hernia, esophagitis,    ESOPHAGOGASTRODUODENOSCOPY (EGD) WITH PROPOFOL N/A 05/09/2019   Dr. Oneida Alar: Large hiatal hernia with erythema and edema in the pouch, mild gastritis.  No biopsies taken.   ESOPHAGOGASTRODUODENOSCOPY (EGD) WITH PROPOFOL N/A 05/26/2019   Procedure: ESOPHAGOGASTRODUODENOSCOPY (EGD) WITH PROPOFOL;  Surgeon: Danie Binder, MD;  [facility];  Service: Endoscopy;  Laterality: N/A;   EUS  12/2018   Dr. Newman Pies: LA grade B esophagitis, antritis but negative H. pylori, 1 cm ulcer at the duodenal sweep   GIVENS CAPSULE STUDY N/A  12/05/2012   Few superficial erosions but nothing found to explain iron deficiency anemia.    GIVENS CAPSULE STUDY N/A 05/26/2019   Procedure: GIVENS CAPSULE STUDY;  Surgeon: Danie Binder, MD;  Location: [facility];  Service: Endoscopy;  Laterality: N/A;   Ileocolonoscopy  06/05/2008     [id number] anal canal, otherwise normal rectum, colon   TONSILLECTOMY      Social History: Social History   Socioeconomic History   Marital status: Divorced    Spouse name: Not on file   Number of children: Not on file   Years of education: Not on file   Highest education level: Some college, no degree  Occupational History   Occupation: disabled  Tobacco Use   Smoking status:  Every Day    Packs/day: 1.00    Years: 25.00    Additional pack years: 0.00    Total pack years: 25.00    Types: Cigarettes    Passive exposure: Current   Smokeless tobacco: Never  Vaping Use   Vaping Use: Never used  Substance and Sexual Activity   Alcohol use: No   Drug use: No   Sexual activity: Yes    Birth control/protection: Surgical  Other Topics Concern   Not on file  Social History Narrative   ** Merged History Encounter **       She is on disability for pancreatitis since 2014.  She was previously working as a Presenter, broadcasting in 2004. She lives with her husband in a two level home.    Social Determinants of Health   Financial Resource Strain: Low Risk  (02/16/2019)   Overall Financial Resource Strain (CARDIA)    Difficulty of Paying Living Expenses: Not hard at all  Food Insecurity: No Food Insecurity (10/16/2019)   Hunger Vital Sign    Worried About Running Out of Food in the Last Year: Never true    Ran Out of Food in the Last Year: Never true  Transportation Needs: No Transportation Needs (10/16/2019)   PRAPARE - Hydrologist (Medical): No    Lack of Transportation (Non-Medical): No  Physical Activity: Not on file  Stress: Not on file  Social Connections: Not on file  Intimate Partner Violence: Not on file    Family History: Family History  Problem Relation Age of Onset   Asthma Other    Asthma Paternal Grandfather    Heart attack Paternal Grandfather    Hypertension Mother    Cancer Maternal Grandmother    Diabetes Paternal Grandmother    Coronary artery disease Neg Hx    Colon cancer Neg Hx     Current Medications:  Current Outpatient Medications:    albuterol (PROVENTIL HFA;VENTOLIN HFA) 108 (90 BASE) MCG/ACT inhaler, Inhale 2 puffs into the lungs daily as needed for wheezing. For shortness of breath, Disp: , Rfl:    albuterol (PROVENTIL) (2.5 MG/3ML) 0.083% nebulizer solution, Take 2.5 mg by nebulization every 6 (six)  hours as needed for wheezing or shortness of breath. , Disp: , Rfl:    albuterol (VENTOLIN HFA) 108 (90 Base) MCG/ACT inhaler, Inhale into the lungs., Disp: , Rfl:    amitriptyline (ELAVIL) 100 MG tablet, Take 100 mg by mouth at bedtime., Disp: , Rfl:    atorvastatin (LIPITOR) 20 MG tablet, Take 20 mg by mouth daily., Disp: , Rfl:    bisacodyl (DULCOLAX) 5 MG EC tablet, Take 1 tablet (5 mg total) by mouth 2 (two) times daily., Disp: 14 tablet,  Rfl: 0   budesonide-formoterol (SYMBICORT) 80-4.5 MCG/ACT inhaler, Inhale 2 puffs into the lungs 2 (two) times daily. (Patient taking differently: Inhale 2 puffs into the lungs daily.), Disp: 1 Inhaler, Rfl: 0   busPIRone (BUSPAR) 10 MG tablet, Take 10 mg by mouth 3 (three) times daily., Disp: , Rfl:    citalopram (CELEXA) 40 MG tablet, Take 40 mg by mouth daily., Disp: , Rfl:    Docusate Sodium (DSS) 100 MG CAPS, Take 1 capsule by mouth at bedtime., Disp: , Rfl:    DULoxetine (CYMBALTA) 60 MG capsule, Take 60 mg by mouth daily., Disp: , Rfl:    ferrous sulfate 325 (65 FE) MG tablet, Take 325 mg by mouth daily., Disp: , Rfl:    furosemide (LASIX) 20 MG tablet, Take 1 tablet (20 mg total) by mouth daily., Disp: 30 tablet, Rfl: 2   gabapentin (NEURONTIN) 300 MG capsule, Take 300 mg by mouth 3 (three) times daily. , Disp: , Rfl:    IBU 800 MG tablet, Take by mouth., Disp: , Rfl:    ipratropium-albuterol (DUONEB) 0.5-2.5 (3) MG/3ML SOLN, Inhale into the lungs., Disp: , Rfl:    lactulose, encephalopathy, (CHRONULAC) 10 GM/15ML SOLN, , Disp: , Rfl:    Lancets (SAFETY LANCET 30G/PRESSURE ACT) MISC, , Disp: , Rfl:    levothyroxine (SYNTHROID, LEVOTHROID) 25 MCG tablet, Take 25 mcg by mouth daily before breakfast., Disp: , Rfl:    lisinopril (PRINIVIL,ZESTRIL) 2.5 MG tablet, Take 2.5 mg by mouth daily. , Disp: , Rfl:    magnesium oxide (MAG-OX) 400 (240 Mg) MG tablet, Take 1 tablet (400 mg total) by mouth 2 (two) times daily., Disp: 60 tablet, Rfl: 0   metFORMIN  (GLUCOPHAGE) 500 MG tablet, Take 2 tablets by mouth 2 (two) times daily., Disp: , Rfl:    methocarbamol (ROBAXIN) 500 MG tablet, Take 500 mg by mouth 3 (three) times daily., Disp: , Rfl:    mupirocin cream (BACTROBAN) 2 %, Apply 1 application topically daily. Apply to right 2nd toe and left great toe once daily until healed., Disp: 30 g, Rfl: 1   ondansetron (ZOFRAN) 4 MG tablet, Take 4 mg by mouth every 8 (eight) hours as needed for vomiting or nausea., Disp: , Rfl:    ONETOUCH ULTRA test strip, , Disp: , Rfl:    oxyCODONE (ROXICODONE) 15 MG immediate release tablet, Take 15 mg by mouth 4 (four) times daily. , Disp: , Rfl:    Pancrelipase, Lip-Prot-Amyl, (ZENPEP) 20000-63000 units CPEP, Take 1 capsule by mouth 3 times a day with meals and 1 capsules with snacks., Disp: , Rfl:    pantoprazole (PROTONIX) 40 MG tablet, Take 40 mg by mouth 2 (two) times daily., Disp: , Rfl:    polyethylene glycol powder (GLYCOLAX/MIRALAX) powder, Take 17 g by mouth 2 (two) times daily. Until daily soft stools  OTC (Patient taking differently: Take 17 g by mouth as needed for mild constipation. Until daily soft stools  OTC), Disp: 250 g, Rfl: 0   SPIRIVA HANDIHALER 18 MCG inhalation capsule, Place 18 mcg into inhaler and inhale daily. , Disp: , Rfl:    valACYclovir (VALTREX) 500 MG tablet, Take 500 mg by mouth daily., Disp: , Rfl:    Allergies: Allergies  Allergen Reactions   Penicillins Shortness Of Breath     Has patient had a PCN reaction causing immediate rash, facial/tongue/throat swelling, SOB or lightheadedness with hypotension: Yes Has patient had a PCN reaction causing severe rash involving mucus membranes or skin necrosis:  Yes Has patient had a PCN reaction that required hospitalization No Has patient had a PCN reaction occurring within the last 10 years: No  If all of the above answers are "NO", then may proceed with Cephalosporin use. Tolerated ceftriaxone   Sulfa Antibiotics Shortness Of Breath    Aspirin Nausea And Vomiting   Enoxaparin Other (See Comments)    Has been known to cause her blood clots in her legs Has been known to cause her blood clots in her legs   Lovenox  [Enoxaparin Sodium] Other (See Comments)    Has been known to cause her blood clots in her legs   Prednisone Other (See Comments)    Pancreatitis, but has to take for asthma sometimes    REVIEW OF SYSTEMS:   Review of Systems  Constitutional:  Negative for chills, fatigue and fever.  HENT:   Negative for lump/mass, mouth sores, nosebleeds, sore throat and trouble swallowing.   Eyes:  Negative for eye problems.  Respiratory:  Negative for cough and shortness of breath.   Cardiovascular:  Negative for chest pain, leg swelling and palpitations.  Gastrointestinal:  Negative for abdominal pain, constipation, diarrhea, nausea and vomiting.  Genitourinary:  Negative for bladder incontinence, difficulty urinating, dysuria, frequency, hematuria and nocturia.   Musculoskeletal:  Negative for arthralgias, back pain, flank pain, myalgias and neck pain.  Skin:  Negative for itching and rash.  Neurological:  Negative for dizziness, headaches and numbness.  Hematological:  Does not bruise/bleed easily.  Psychiatric/Behavioral:  Negative for depression, sleep disturbance and suicidal ideas. The patient is not nervous/anxious.   All other systems reviewed and are negative.    VITALS:   There were no vitals taken for this visit.  Wt Readings from Last 3 Encounters:  07/09/22 53.9 kg (118 lb 12.8 oz)  03/13/22 52.3 kg (115 lb 4.8 oz)  02/16/22 53.5 kg (118 lb)    There is no height or weight on file to calculate BMI.  Performance status (ECOG): {CHL ONC [id number]  PHYSICAL EXAM:   Physical Exam Vitals and nursing note reviewed. Exam conducted with a chaperone present.  Constitutional:      Appearance: Normal appearance.  Cardiovascular:     Rate and Rhythm: Normal rate and regular rhythm.     Pulses:  Normal pulses.     Heart sounds: Normal heart sounds.  Pulmonary:     Effort: Pulmonary effort is normal.     Breath sounds: Normal breath sounds.  Abdominal:     Palpations: Abdomen is soft. There is no hepatomegaly, splenomegaly or mass.     Tenderness: There is no abdominal tenderness.  Musculoskeletal:     Right lower leg: No edema.     Left lower leg: No edema.  Lymphadenopathy:     Cervical: No cervical adenopathy.     Right cervical: No superficial, deep or posterior cervical adenopathy.    Left cervical: No superficial, deep or posterior cervical adenopathy.     Upper Body:     Right upper body: No supraclavicular or axillary adenopathy.     Left upper body: No supraclavicular or axillary adenopathy.  Neurological:     General: No focal deficit present.     Mental Status: She is alert and oriented to person, place, and time.  Psychiatric:        Mood and Affect: Mood normal.        Behavior: Behavior normal.     LABS:      Latest Ref Rng &  Units 10/06/2022    3:08 PM 07/09/2022    8:31 AM 03/13/2022    8:24 AM  CBC  WBC 4.0 - 10.5 K/uL 10.3  8.6  9.3   Hemoglobin 12.0 - 15.0 g/dL 12.4  11.6  11.0   Hematocrit 36.0 - 46.0 % 37.3  36.7  34.6   Platelets 150 - 400 K/uL 204  250  328       Latest Ref Rng & Units 03/13/2022    8:24 AM 03/12/2022    4:47 AM 03/11/2022    4:06 AM  CMP  Glucose 70 - 99 mg/dL 188  157  173   BUN 6 - 20 mg/dL 15  11  9    Creatinine 0.44 - 1.00 mg/dL 0.71  0.53  0.53   Sodium 135 - 145 mmol/L 138  139  140   Potassium 3.5 - 5.1 mmol/L 3.2  3.6  3.7   Chloride 98 - 111 mmol/L 93  99  102   CO2 22 - 32 mmol/L 31  33  29   Calcium 8.9 - 10.3 mg/dL 9.0  8.7  8.5      No results found for: "CEA1", "CEA" / No results found for: "CEA1", "CEA" No results found for: "PSA1" No results found for: "CAN199" No results found for: "CAN125"  Lab Results  Component Value Date   TOTALPROTELP 6.2 01/29/2022   ALBUMINELP 3.2 01/29/2022   A1GS 0.3  01/29/2022   A2GS 0.9 01/29/2022   BETS 0.8 01/29/2022   GAMS 0.9 01/29/2022   MSPIKE Not Observed 01/29/2022   SPEI Comment 01/29/2022   Lab Results  Component Value Date   TIBC 253 10/06/2022   TIBC 333 07/09/2022   TIBC 415 01/29/2022   FERRITIN 99 10/06/2022   FERRITIN 7 (L) 07/09/2022   FERRITIN 4 (L) 01/29/2022   IRONPCTSAT 23 10/06/2022   IRONPCTSAT 9 (L) 07/09/2022   IRONPCTSAT 4 (L) 01/29/2022   Lab Results  Component Value Date   LDH 137 01/29/2022     STUDIES:   No results found.

## 2022-10-13 ENCOUNTER — Inpatient Hospital Stay: Payer: Medicare Other

## 2022-10-13 ENCOUNTER — Inpatient Hospital Stay (HOSPITAL_BASED_OUTPATIENT_CLINIC_OR_DEPARTMENT_OTHER): Payer: Medicare Other | Admitting: Hematology

## 2022-10-13 VITALS — BP 108/67 | HR 92 | Temp 97.1°F | Resp 19 | Ht 62.0 in | Wt 112.8 lb

## 2022-10-13 DIAGNOSIS — D649 Anemia, unspecified: Secondary | ICD-10-CM

## 2022-10-13 DIAGNOSIS — D500 Iron deficiency anemia secondary to blood loss (chronic): Secondary | ICD-10-CM | POA: Diagnosis not present

## 2022-10-13 DIAGNOSIS — E538 Deficiency of other specified B group vitamins: Secondary | ICD-10-CM

## 2022-10-13 MED ORDER — CYANOCOBALAMIN 1000 MCG/ML IJ SOLN: 1000.0000 ug | Freq: Once | INTRAMUSCULAR | Status: DC

## 2022-10-13 MED ORDER — CYANOCOBALAMIN 1000 MCG/ML IJ SOLN
1000.0000 ug | Freq: Once | INTRAMUSCULAR | Status: AC
  Administered 2022-10-13: 1000 ug via INTRAMUSCULAR
  Filled 2022-10-13: qty 1

## 2022-10-13 NOTE — Progress Notes (Signed)
B 12 injection given in L deltoid per order.  Site CDI and was tolerated without incident.  Patient discharged ambulatory in stable condition.

## 2022-10-13 NOTE — Patient Instructions (Signed)
[facility]  Discharge Instructions  You were seen and examined today by Dr. Delton Coombes.  Dr. Delton Coombes discussed your most recent lab work which revealed that everything looks good except your Methylmalonic acid is high which is an indicator that your Vitamin B12 is low.  Dr. Delton Coombes is ordering B 12 injection to be given today.  IV iron to be done in 1 month with your B 12 injection.  Follow-up as scheduled in 3 months with labs.    Thank you for choosing [facility] to provide your oncology and hematology care.   To afford each patient quality time with our provider, please arrive at least 15 minutes before your scheduled appointment time. You may need to reschedule your appointment if you arrive late (10 or more minutes). Arriving late affects you and other patients whose appointments are after yours.  Also, if you miss three or more appointments without notifying the office, you may be dismissed from the clinic at the provider's discretion.    Again, thank you for choosing [facility].  Our hope is that these requests will decrease the amount of time that you wait before being seen by our physicians.   If you have a lab appointment with the [facility] please come in thru the Main Entrance and check in at the main information desk.           _____________________________________________________________  Should you have questions after your visit to [facility], please contact our office at [phone] and follow the prompts.  Our office hours are 8:00 a.m. to 4:30 p.m. Monday - Thursday and 8:00 a.m. to 2:30 p.m. Friday.  Please note that voicemails left after 4:00 p.m. may not be returned until the following business day.  We are closed weekends and all major holidays.  You do have access to a nurse 24-7, just call the main number to the clinic [phone] and do not press any options, hold on  the line and a nurse will answer the phone.    For prescription refill requests, have your pharmacy contact our office and allow 72 hours.    Masks are optional in the cancer centers. If you would like for your care team to wear a mask while they are taking care of you, please let them know. You may have one support person who is at least 54 years old accompany you for your appointments.

## 2022-10-13 NOTE — Progress Notes (Signed)
Patient has been assessed, vital signs and labs have been reviewed by Dr. Katragadda. ANC, Creatinine, LFTs, and Platelets are within treatment parameters per Dr. Katragadda. The patient is good to proceed with Vitamin B12 injection treatment at this time.  Primary RN and pharmacy aware. ° °

## 2022-11-13 ENCOUNTER — Inpatient Hospital Stay: Payer: Medicare Other

## 2022-11-17 ENCOUNTER — Inpatient Hospital Stay: Payer: Medicare Other | Attending: Hematology

## 2022-11-17 VITALS — BP 115/82 | HR 97 | Temp 98.0°F | Resp 18

## 2022-11-17 DIAGNOSIS — E538 Deficiency of other specified B group vitamins: Secondary | ICD-10-CM | POA: Diagnosis not present

## 2022-11-17 DIAGNOSIS — Z79899 Other long term (current) drug therapy: Secondary | ICD-10-CM | POA: Insufficient documentation

## 2022-11-17 DIAGNOSIS — D649 Anemia, unspecified: Secondary | ICD-10-CM | POA: Diagnosis not present

## 2022-11-17 DIAGNOSIS — D500 Iron deficiency anemia secondary to blood loss (chronic): Secondary | ICD-10-CM

## 2022-11-17 MED ORDER — CYANOCOBALAMIN 1000 MCG/ML IJ SOLN
1000.0000 ug | Freq: Once | INTRAMUSCULAR | Status: AC
  Administered 2022-11-17: 1000 ug via INTRAMUSCULAR
  Filled 2022-11-17: qty 1

## 2022-11-17 MED ORDER — ACETAMINOPHEN 325 MG PO TABS
650.0000 mg | ORAL_TABLET | Freq: Once | ORAL | Status: AC
  Administered 2022-11-17: 650 mg via ORAL
  Filled 2022-11-17: qty 2

## 2022-11-17 MED ORDER — SODIUM CHLORIDE 0.9 % IV SOLN
500.0000 mg | Freq: Once | INTRAVENOUS | Status: AC
  Administered 2022-11-17: 500 mg via INTRAVENOUS
  Filled 2022-11-17: qty 25

## 2022-11-17 MED ORDER — CETIRIZINE HCL 10 MG PO TABS
10.0000 mg | ORAL_TABLET | Freq: Once | ORAL | Status: AC
  Administered 2022-11-17: 10 mg via ORAL
  Filled 2022-11-17: qty 1

## 2022-11-17 MED ORDER — SODIUM CHLORIDE 0.9 % IV SOLN: Freq: Once | INTRAVENOUS | Status: AC

## 2022-11-17 MED ORDER — ONDANSETRON HCL 4 MG/2ML IJ SOLN
4.0000 mg | Freq: Once | INTRAMUSCULAR | Status: AC
  Administered 2022-11-17: 4 mg via INTRAVENOUS
  Filled 2022-11-17: qty 2

## 2022-11-17 MED ORDER — FAMOTIDINE IN NACL 20-0.9 MG/50ML-% IV SOLN
20.0000 mg | Freq: Once | INTRAVENOUS | Status: AC
  Administered 2022-11-17: 20 mg via INTRAVENOUS
  Filled 2022-11-17: qty 50

## 2022-11-17 NOTE — Patient Instructions (Signed)
[facility] [place]  Discharge Instructions: Thank you for choosing [facility] to provide your oncology and hematology care.  If you have a lab appointment with the Cancer Center - please note that after April 8th, 2024, all labs will be drawn in the cancer center.  You do not have to check in or register with the main entrance as you have in the past but will complete your check-in in the cancer center.  Wear comfortable clothing and clothing appropriate for easy access to any Portacath or PICC line.   We strive to give you quality time with your provider. You may need to reschedule your appointment if you arrive late (15 or more minutes).  Arriving late affects you and other patients whose appointments are after yours.  Also, if you miss three or more appointments without notifying the office, you may be dismissed from the clinic at the provider's discretion.      For prescription refill requests, have your pharmacy contact our office and allow 72 hours for refills to be completed.    Today you received the following chemotherapy and/or immunotherapy agents Venofer.  Iron Sucrose Injection What is this medication? IRON SUCROSE (EYE ern SOO krose) treats low levels of iron (iron deficiency anemia) in people with kidney disease. Iron is a mineral that plays an important role in making red blood cells, which carry oxygen from your lungs to the rest of your body. This medicine may be used for other purposes; ask your health care provider or pharmacist if you have questions. COMMON BRAND NAME(S): Venofer What should I tell my care team before I take this medication? They need to know if you have any of these conditions: Anemia not caused by low iron levels Heart disease High levels of iron in the blood Kidney disease Liver disease An unusual or allergic reaction to iron, other medications, foods, dyes, or preservatives Pregnant or trying to get  pregnant Breastfeeding How should I use this medication? This medication is for infusion into a vein. It is given in a hospital or clinic setting. Talk to your care team about the use of this medication in children. While this medication may be prescribed for children as young as 2 years for selected conditions, precautions do apply. Overdosage: If you think you have taken too much of this medicine contact a poison control center or emergency room at once. NOTE: This medicine is only for you. Do not share this medicine with others. What if I miss a dose? Keep appointments for follow-up doses. It is important not to miss your dose. Call your care team if you are unable to keep an appointment. What may interact with this medication? Do not take this medication with any of the following: Deferoxamine Dimercaprol Other iron products This medication may also interact with the following: Chloramphenicol Deferasirox This list may not describe all possible interactions. Give your health care provider a list of all the medicines, herbs, non-prescription drugs, or dietary supplements you use. Also tell them if you smoke, drink alcohol, or use illegal drugs. Some items may interact with your medicine. What should I watch for while using this medication? Visit your care team regularly. Tell your care team if your symptoms do not start to get better or if they get worse. You may need blood work done while you are taking this medication. You may need to follow a special diet. Talk to your care team. Foods that contain iron include: whole grains/cereals,   dried fruits, beans, or peas, leafy green vegetables, and organ meats (liver, kidney). What side effects may I notice from receiving this medication? Side effects that you should report to your care team as soon as possible: Allergic reactions--skin rash, itching, hives, swelling of the face, lips, tongue, or throat Low blood pressure--dizziness, feeling  faint or lightheaded, blurry vision Shortness of breath Side effects that usually do not require medical attention (report to your care team if they continue or are bothersome): Flushing Headache Joint pain Muscle pain Nausea Pain, redness, or irritation at injection site This list may not describe all possible side effects. Call your doctor for medical advice about side effects. You may report side effects to FDA at 1-800-FDA-1088. Where should I keep my medication? This medication is given in a hospital or clinic and will not be stored at home. NOTE: This sheet is a summary. It may not cover all possible information. If you have questions about this medicine, talk to your doctor, pharmacist, or health care provider.  2023 Elsevier/Gold Standard (2020-10-24 00:00:00)       To help prevent nausea and vomiting after your treatment, we encourage you to take your nausea medication as directed.  BELOW ARE SYMPTOMS THAT SHOULD BE REPORTED IMMEDIATELY: *FEVER GREATER THAN 100.4 F (38 C) OR HIGHER *CHILLS OR SWEATING *NAUSEA AND VOMITING THAT IS NOT CONTROLLED WITH YOUR NAUSEA MEDICATION *UNUSUAL SHORTNESS OF BREATH *UNUSUAL BRUISING OR BLEEDING *URINARY PROBLEMS (pain or burning when urinating, or frequent urination) *BOWEL PROBLEMS (unusual diarrhea, constipation, pain near the anus) TENDERNESS IN MOUTH AND THROAT WITH OR WITHOUT PRESENCE OF ULCERS (sore throat, sores in mouth, or a toothache) UNUSUAL RASH, SWELLING OR PAIN  UNUSUAL VAGINAL DISCHARGE OR ITCHING   Items with * indicate a potential emergency and should be followed up as soon as possible or go to the Emergency Department if any problems should occur.  Please show the CHEMOTHERAPY ALERT CARD or IMMUNOTHERAPY ALERT CARD at check-in to the Emergency Department and triage nurse.  Should you have questions after your visit or need to cancel or reschedule your appointment, please contact [facility]  [phone]  and follow the prompts.  Office hours are 8:00 a.m. to 4:30 p.m. Monday - Friday. Please note that voicemails left after 4:00 p.m. may not be returned until the following business day.  We are closed weekends and major holidays. You have access to a nurse at all times for urgent questions. Please call the main number to the clinic [phone] and follow the prompts.  For any non-urgent questions, you may also contact your provider using MyChart. We now offer e-Visits for anyone 63 and older to request care online for non-urgent symptoms. For details visit [email or web address].   Also download the MyChart app! Go to the app store, search "MyChart", open the app, select [facility], and log in with your MyChart username and password.

## 2022-11-17 NOTE — Progress Notes (Signed)
Patient presents today for B12 injection and Venofer 500 mg infusion. MAR reviewed and updated. Patient states she fell off the bed this morning and hit her back and rates her back pain a 6/10. Vital signs within parameters for infusion.   13:08 pm patient calls out due to swelling in her hands and tightness when she closes her hands per patient's words.  Upon assessment no edema noted to hands or feat. Patient states , " it doesn't look swollen but it feels swollen." Venofer 500 mg stopped at this time. Normal Saline hung and 200 ml bolus of normal saline initiated. Vital signs stable and within normal limits. Patient denies shortness of breath, new pain, itching or chest heaviness. Patient has complaints of nausea.   Message sent to R. Pennington PA. Orders received to give  of Zofran IV push x 1 dose now. Monitor for 30  minutes.  IV Zofran 4 mg PRN Encourage snack  Watch for 30 minutes.   13:27 pm 4 mg of Zofran given IV push.   Venofer given today per MD orders. Vital signs stable. No complaints at this time. Discharged from clinic ambulatory in stable condition. Alert and oriented x 3. F/U with [facility] as scheduled.

## 2022-11-17 NOTE — Progress Notes (Addendum)
Hypersensitivity Reaction note  Date of event: 11/17/22 Time of event: 1308 Generic name of drug involved: Venofer Name of provider notified of the hypersensitivity reaction: R.Pennington PA Was agent that likely caused hypersensitivity reaction added to Allergies List within EMR? yes Chain of events including reaction :  13:08 pm patient calls out due to swelling in her hands and tightness when she closes her hands per patient's words.  Upon assessment no edema noted to hands or feat. Patient states , " it doesn't look swollen but it feels swollen." Venofer 500 mg stopped at this time. Normal Saline hung and 200 ml bolus of normal saline initiated. Vital signs stable and within normal limits. Patient denies shortness of breath, new pain, itching or chest heaviness. Patient has complaints of nausea.   Message sent to R. Pennington PA. Orders received to give  of Zofran IV push x 1 dose now. Monitor for 30  minutes.  IV Zofran 4 mg PRN Encourage snack  Watch for 30 minutes.   13:27 pm 4 mg of Zofran given IV push.   Venofer given today per MD orders. Vital signs stable. No complaints at this time. Discharged from clinic ambulatory in stable condition. Alert and oriented x 3. F/U with [facility] as scheduled.   Carren Rang, RN 11/17/2022 4:13 PM

## 2022-11-26 ENCOUNTER — Encounter (HOSPITAL_COMMUNITY): Payer: Self-pay | Admitting: *Deleted

## 2022-11-26 ENCOUNTER — Emergency Department (HOSPITAL_COMMUNITY): Payer: Medicare Other

## 2022-11-26 ENCOUNTER — Other Ambulatory Visit: Payer: Self-pay

## 2022-11-26 ENCOUNTER — Observation Stay (HOSPITAL_COMMUNITY)
Admission: EM | Admit: 2022-11-26 | Discharge: 2022-11-28 | Disposition: A | Discharge status: Home Health | Payer: Medicare Other | Attending: General Surgery | Admitting: General Surgery

## 2022-11-26 DIAGNOSIS — E1122 Type 2 diabetes mellitus with diabetic chronic kidney disease: Secondary | ICD-10-CM | POA: Diagnosis not present

## 2022-11-26 DIAGNOSIS — R42 Dizziness and giddiness: Secondary | ICD-10-CM | POA: Diagnosis not present

## 2022-11-26 DIAGNOSIS — F17210 Nicotine dependence, cigarettes, uncomplicated: Secondary | ICD-10-CM | POA: Diagnosis not present

## 2022-11-26 DIAGNOSIS — Z7984 Long term (current) use of oral hypoglycemic drugs: Secondary | ICD-10-CM | POA: Diagnosis not present

## 2022-11-26 DIAGNOSIS — J449 Chronic obstructive pulmonary disease, unspecified: Secondary | ICD-10-CM | POA: Diagnosis not present

## 2022-11-26 DIAGNOSIS — J45909 Unspecified asthma, uncomplicated: Secondary | ICD-10-CM | POA: Insufficient documentation

## 2022-11-26 DIAGNOSIS — E039 Hypothyroidism, unspecified: Secondary | ICD-10-CM | POA: Insufficient documentation

## 2022-11-26 DIAGNOSIS — I129 Hypertensive chronic kidney disease with stage 1 through stage 4 chronic kidney disease, or unspecified chronic kidney disease: Secondary | ICD-10-CM | POA: Insufficient documentation

## 2022-11-26 DIAGNOSIS — E440 Moderate protein-calorie malnutrition: Secondary | ICD-10-CM | POA: Diagnosis not present

## 2022-11-26 DIAGNOSIS — I959 Hypotension, unspecified: Secondary | ICD-10-CM | POA: Diagnosis not present

## 2022-11-26 DIAGNOSIS — N189 Chronic kidney disease, unspecified: Secondary | ICD-10-CM | POA: Diagnosis not present

## 2022-11-26 DIAGNOSIS — S300XXA Contusion of lower back and pelvis, initial encounter: Secondary | ICD-10-CM | POA: Diagnosis not present

## 2022-11-26 DIAGNOSIS — W1811XA Fall from or off toilet without subsequent striking against object, initial encounter: Secondary | ICD-10-CM | POA: Diagnosis not present

## 2022-11-26 DIAGNOSIS — Z79899 Other long term (current) drug therapy: Secondary | ICD-10-CM | POA: Diagnosis not present

## 2022-11-26 DIAGNOSIS — W19XXXA Unspecified fall, initial encounter: Secondary | ICD-10-CM | POA: Diagnosis present

## 2022-11-26 DIAGNOSIS — Y92009 Unspecified place in unspecified non-institutional (private) residence as the place of occurrence of the external cause: Secondary | ICD-10-CM | POA: Diagnosis present

## 2022-11-26 DIAGNOSIS — Y92019 Unspecified place in single-family (private) house as the place of occurrence of the external cause: Secondary | ICD-10-CM | POA: Diagnosis not present

## 2022-11-26 LAB — CBC WITH DIFFERENTIAL/PLATELET
Abs Immature Granulocytes: 0.04 10*3/uL (ref 0.00–0.07)
Basophils Absolute: 0 10*3/uL (ref 0.0–0.1)
Basophils Relative: 0 %
Eosinophils Absolute: 0.5 10*3/uL (ref 0.0–0.5)
Eosinophils Relative: 5 %
HCT: 35.2 % — ABNORMAL LOW (ref 36.0–46.0)
Hemoglobin: 11.3 g/dL — ABNORMAL LOW (ref 12.0–15.0)
Immature Granulocytes: 0 %
Lymphocytes Relative: 21 %
Lymphs Abs: 2.1 10*3/uL (ref 0.7–4.0)
MCH: 34.3 pg — ABNORMAL HIGH (ref 26.0–34.0)
MCHC: 32.1 g/dL (ref 30.0–36.0)
MCV: 107 fL — ABNORMAL HIGH (ref 80.0–100.0)
Monocytes Absolute: 0.6 10*3/uL (ref 0.1–1.0)
Monocytes Relative: 6 %
Neutro Abs: 6.7 10*3/uL (ref 1.7–7.7)
Neutrophils Relative %: 68 %
Platelets: 215 10*3/uL (ref 150–400)
RBC: 3.29 MIL/uL — ABNORMAL LOW (ref 3.87–5.11)
RDW: 12.1 % (ref 11.5–15.5)
WBC: 9.9 10*3/uL (ref 4.0–10.5)
nRBC: 0 % (ref 0.0–0.2)

## 2022-11-26 LAB — I-STAT CHEM 8, ED
BUN: 21 mg/dL — ABNORMAL HIGH (ref 6–20)
Calcium, Ion: 1.14 mmol/L — ABNORMAL LOW (ref 1.15–1.40)
Chloride: 102 mmol/L (ref 98–111)
Creatinine, Ser: 1.4 mg/dL — ABNORMAL HIGH (ref 0.44–1.00)
Glucose, Bld: 90 mg/dL (ref 70–99)
HCT: 27 % — ABNORMAL LOW (ref 36.0–46.0)
Hemoglobin: 9.2 g/dL — ABNORMAL LOW (ref 12.0–15.0)
Potassium: 4.4 mmol/L (ref 3.5–5.1)
Sodium: 139 mmol/L (ref 135–145)
TCO2: 26 mmol/L (ref 22–32)

## 2022-11-26 LAB — TYPE AND SCREEN
ABO/RH(D): A POS
ABO/RH(D): A POS
Antibody Screen: NEGATIVE
Antibody Screen: NEGATIVE

## 2022-11-26 LAB — COMPREHENSIVE METABOLIC PANEL
ALT: 27 U/L (ref 0–44)
AST: 29 U/L (ref 15–41)
Albumin: 3.4 g/dL — ABNORMAL LOW (ref 3.5–5.0)
Alkaline Phosphatase: 69 U/L (ref 38–126)
Anion gap: 13 (ref 5–15)
BUN: 21 mg/dL — ABNORMAL HIGH (ref 6–20)
CO2: 21 mmol/L — ABNORMAL LOW (ref 22–32)
Calcium: 8.1 mg/dL — ABNORMAL LOW (ref 8.9–10.3)
Chloride: 99 mmol/L (ref 98–111)
Creatinine, Ser: 1.46 mg/dL — ABNORMAL HIGH (ref 0.44–1.00)
GFR, Estimated: 43 mL/min — ABNORMAL LOW (ref >60)
Glucose, Bld: 154 mg/dL — ABNORMAL HIGH (ref 70–99)
Potassium: 4.3 mmol/L (ref 3.5–5.1)
Sodium: 133 mmol/L — ABNORMAL LOW (ref 135–145)
Total Bilirubin: 0.6 mg/dL (ref 0.3–1.2)
Total Protein: 5.9 g/dL — ABNORMAL LOW (ref 6.5–8.1)

## 2022-11-26 LAB — CBG MONITORING, ED: Glucose-Capillary: 163 mg/dL — ABNORMAL HIGH (ref 70–99)

## 2022-11-26 LAB — HEMOGLOBIN AND HEMATOCRIT, BLOOD
HCT: 27.6 % — ABNORMAL LOW (ref 36.0–46.0)
Hemoglobin: 8.8 g/dL — ABNORMAL LOW (ref 12.0–15.0)

## 2022-11-26 MED ORDER — AMITRIPTYLINE HCL 50 MG PO TABS
75.0000 mg | ORAL_TABLET | Freq: Every evening | ORAL | Status: DC | PRN
  Administered 2022-11-27: 75 mg via ORAL
  Filled 2022-11-26: qty 2

## 2022-11-26 MED ORDER — IOHEXOL 300 MG/ML  SOLN
100.0000 mL | Freq: Once | INTRAMUSCULAR | Status: AC | PRN
  Administered 2022-11-26: 80 mL via INTRAVENOUS

## 2022-11-26 MED ORDER — PANTOPRAZOLE SODIUM 40 MG PO TBEC
40.0000 mg | DELAYED_RELEASE_TABLET | Freq: Two times a day (BID) | ORAL | Status: DC
  Administered 2022-11-27 – 2022-11-28 (×4): 40 mg via ORAL
  Filled 2022-11-26 (×4): qty 1

## 2022-11-26 MED ORDER — LACTATED RINGERS IV BOLUS
1000.0000 mL | Freq: Once | INTRAVENOUS | Status: AC
  Administered 2022-11-26: 1000 mL via INTRAVENOUS

## 2022-11-26 MED ORDER — SODIUM CHLORIDE 0.9 % IV BOLUS
1000.0000 mL | Freq: Once | INTRAVENOUS | Status: AC
  Administered 2022-11-26: 1000 mL via INTRAVENOUS

## 2022-11-26 MED ORDER — HYDROMORPHONE HCL 1 MG/ML IJ SOLN
0.5000 mg | INTRAMUSCULAR | Status: DC | PRN
  Administered 2022-11-27 (×3): 0.5 mg via INTRAVENOUS
  Filled 2022-11-26 (×3): qty 0.5

## 2022-11-26 MED ORDER — ACETAMINOPHEN 325 MG PO TABS
650.0000 mg | ORAL_TABLET | Freq: Four times a day (QID) | ORAL | Status: DC
  Administered 2022-11-27: 650 mg via ORAL
  Filled 2022-11-26: qty 2

## 2022-11-26 MED ORDER — LEVOTHYROXINE SODIUM 25 MCG PO TABS
25.0000 ug | ORAL_TABLET | Freq: Every day | ORAL | Status: DC
  Administered 2022-11-27 – 2022-11-28 (×2): 25 ug via ORAL
  Filled 2022-11-26 (×2): qty 1

## 2022-11-26 MED ORDER — OXYCODONE HCL 5 MG PO TABS
5.0000 mg | ORAL_TABLET | ORAL | Status: DC | PRN
  Administered 2022-11-27: 5 mg via ORAL
  Filled 2022-11-26: qty 1

## 2022-11-26 MED ORDER — MELATONIN 3 MG PO TABS
3.0000 mg | ORAL_TABLET | Freq: Every evening | ORAL | Status: DC | PRN
  Administered 2022-11-27: 3 mg via ORAL
  Filled 2022-11-26: qty 1

## 2022-11-26 MED ORDER — METHOCARBAMOL 1000 MG/10ML IJ SOLN: 500.0000 mg | Freq: Three times a day (TID) | INTRAVENOUS | Status: DC | PRN

## 2022-11-26 MED ORDER — ONDANSETRON 4 MG PO TBDP
4.0000 mg | ORAL_TABLET | Freq: Four times a day (QID) | ORAL | Status: DC | PRN
  Administered 2022-11-28: 4 mg via ORAL
  Filled 2022-11-26: qty 1

## 2022-11-26 MED ORDER — BUSPIRONE HCL 5 MG PO TABS
10.0000 mg | ORAL_TABLET | Freq: Every day | ORAL | Status: DC
  Administered 2022-11-27 – 2022-11-28 (×2): 10 mg via ORAL
  Filled 2022-11-26 (×2): qty 2

## 2022-11-26 MED ORDER — ALBUTEROL SULFATE HFA 108 (90 BASE) MCG/ACT IN AERS: 2.0000 | INHALATION_SPRAY | Freq: Every day | RESPIRATORY_TRACT | Status: DC | PRN

## 2022-11-26 MED ORDER — DULOXETINE HCL 60 MG PO CPEP
60.0000 mg | ORAL_CAPSULE | Freq: Every day | ORAL | Status: DC
  Administered 2022-11-27 – 2022-11-28 (×2): 60 mg via ORAL
  Filled 2022-11-26 (×2): qty 1

## 2022-11-26 MED ORDER — ATORVASTATIN CALCIUM 10 MG PO TABS
20.0000 mg | ORAL_TABLET | Freq: Every day | ORAL | Status: DC
  Administered 2022-11-27 – 2022-11-28 (×2): 20 mg via ORAL
  Filled 2022-11-26 (×2): qty 2

## 2022-11-26 MED ORDER — ALBUTEROL SULFATE (2.5 MG/3ML) 0.083% IN NEBU: 2.5000 mg | INHALATION_SOLUTION | Freq: Four times a day (QID) | RESPIRATORY_TRACT | Status: DC | PRN

## 2022-11-26 MED ORDER — METHOCARBAMOL 500 MG PO TABS
500.0000 mg | ORAL_TABLET | Freq: Three times a day (TID) | ORAL | Status: DC | PRN
  Administered 2022-11-27: 500 mg via ORAL
  Filled 2022-11-26: qty 1

## 2022-11-26 MED ORDER — DOCUSATE SODIUM 100 MG PO CAPS
100.0000 mg | ORAL_CAPSULE | Freq: Two times a day (BID) | ORAL | Status: DC
  Administered 2022-11-27 – 2022-11-28 (×4): 100 mg via ORAL
  Filled 2022-11-26 (×4): qty 1

## 2022-11-26 MED ORDER — CITALOPRAM HYDROBROMIDE 20 MG PO TABS
20.0000 mg | ORAL_TABLET | Freq: Every day | ORAL | Status: DC
  Administered 2022-11-28: 20 mg via ORAL
  Filled 2022-11-26 (×2): qty 1

## 2022-11-26 MED ORDER — ONDANSETRON HCL 4 MG/2ML IJ SOLN
4.0000 mg | Freq: Four times a day (QID) | INTRAMUSCULAR | Status: DC | PRN
  Filled 2022-11-26: qty 2

## 2022-11-26 MED ORDER — HYDROMORPHONE HCL 1 MG/ML IJ SOLN
0.5000 mg | Freq: Once | INTRAMUSCULAR | Status: AC
  Administered 2022-11-26: 0.5 mg via INTRAVENOUS
  Filled 2022-11-26: qty 0.5

## 2022-11-26 MED ORDER — ALBUMIN HUMAN 25 % IV SOLN
25.0000 g | Freq: Once | INTRAVENOUS | Status: AC
  Administered 2022-11-26: 25 g via INTRAVENOUS
  Filled 2022-11-26: qty 100

## 2022-11-26 MED ORDER — FENTANYL CITRATE PF 50 MCG/ML IJ SOSY
50.0000 ug | PREFILLED_SYRINGE | Freq: Once | INTRAMUSCULAR | Status: AC
  Administered 2022-11-26: 50 ug via INTRAVENOUS
  Filled 2022-11-26: qty 1

## 2022-11-26 MED ORDER — POLYETHYLENE GLYCOL 3350 17 G PO PACK: 17.0000 g | PACK | Freq: Every day | ORAL | Status: DC | PRN

## 2022-11-26 NOTE — ED Triage Notes (Addendum)
Pt fell last night after losing her balance, states she had taken a sleeping pill. C/o lower back pain.  Tender to right temple area, pt unsure if she hit her head.  Pt denies taking blood thinners. Swelling noted to left lower back.

## 2022-11-26 NOTE — ED Provider Notes (Signed)
Patient is 52 years old she has a history of diabetes bipolar and hypertension.  She fell this morning and hit her head and complains of back pain.  Patient was hypotensive initially with a systolic blood pressure of 76.  Now after 3 L of fluid and some albumin her blood pressure is 96 systolic.  CT scan shows 8/2 x 3.8 x 9.4 hematoma in the posterior lumbar soft tissue area.  I spoke with Dr. Freida Busman of trauma and she recommends the patient being transferred to the ED at [facility] and she will admit the patient.  Dr. Jeraldine Loots emergency physician is excepting the   Bethann Berkshire, MD 11/26/22 [phone]

## 2022-11-26 NOTE — ED Provider Notes (Signed)
[facility] [address]  SURGICAL Provider Note  CSN: [id number] Arrival date & time: 11/26/22 1235  Chief Complaint(s) Fall  HPI [name] is a 52 y.o. female with PMH bipolar 1, anemia, anxiety, COPD, chronic pancreatitis, chronic pain on multiple sedating agents such as oxycodone, amitriptyline, gabapentin who presents emergency room for evaluation of a fall and back pain.  Patient was getting out of bed to go the bathroom and fell backwards.  Unknown loss of consciousness but patient is reporting headache and feels that she may have struck her head on the back of an electric fireplace in her room.  Patient arrives with severe back pain and a large soft tissue swelling that is tender to palpation.  Denies numbness, ting, weakness or other neurologic complaints.  Denies chest pain, shortness of breath, fever or other systemic or traumatic complaints.  Of note, patient arrives hypotensive with initial systolic in the 70s.   Past Medical History Past Medical History:  Diagnosis Date   Anemia    Anxiety    Asthma    Bipolar 1 disorder (HCC)    Chronic abdominal pain    COPD (chronic obstructive pulmonary disease) (HCC)    Depression    Diabetes mellitus    Diabetic neuropathy (HCC) 10/29/2017   Esophagitis    Hiatal hernia    Hyperlipidemia 02/03/2012   Iron deficiency anemia due to chronic blood loss 01/29/2022   Migraine    Neck pain 06/08/2012   Pancreatitis chronic Idiopathic   Treated by Dr. Margaretha Glassing at [facility] in the past with celiac blocks.   Patient Active Problem List   Diagnosis Date Noted   Malnutrition of moderate degree 11/27/2022   Fall 11/26/2022   Sepsis (HCC) 03/07/2022   Coffee ground emesis    Multifocal pneumonia 03/06/2022   Hyperkalemia 03/06/2022   SBO (small bowel obstruction) (HCC) 03/06/2022   Metabolic acidosis 03/06/2022   AKI (acute kidney injury) (HCC) 03/06/2022   Iron deficiency anemia due to chronic blood loss  01/29/2022   Severe sepsis (HCC) 08/24/2021   Lobar pneumonia (HCC) 08/24/2021   Acute on chronic respiratory failure with hypoxia (HCC) 08/24/2021   Thoracic degenerative disc disease 12/06/2019   Hypothyroid 05/31/2019   Arteriovenous malformation (AVM)    Symptomatic anemia    Vitamin B12 deficiency 05/25/2019   Melena    History of pancreatitis 05/06/2019   Septic shock (HCC) 05/06/2019   Radicular pain 01/17/2019   Diabetic neuropathy (HCC) 10/29/2017   Elevated white blood cell count, unspecified 01/23/2015   Eosinophilia 01/23/2015   Iron deficiency 01/23/2015   Taking multiple medications for chronic disease 01/23/2015   DM type 2 (diabetes mellitus, type 2) (HCC) 03/28/2014   COPD exacerbation (HCC) 03/27/2014   CAP (community acquired pneumonia) 06/29/2013   Acute bronchitis 06/29/2013   Anxiety 02/27/2013   Abdominal cramping 02/27/2013   BRBPR (bright red blood per rectum) 12/02/2012   Constipation/Stool Impaction 12/02/2012   ABLA (acute blood loss anemia) 12/02/2012   Pancreatitis 11/24/2012   Hyperglycemia, drug-induced 06/08/2012   Arm paresthesia, left 06/07/2012   Neck pain on left side 06/07/2012   Asthma exacerbation 04/05/2012   Acute otitis media 02/04/2012   Hyperlipidemia 02/03/2012   Acute respiratory failure with hypoxia (HCC) 09/23/2011   Tobacco abuse 09/23/2011   Constipation 08/29/2011   Hypokalemia 07/15/2011   Muscle spasm 04/24/2011   Otitis externa 04/24/2011   Idiopathic acute pancreatitis 04/24/2011   Malodorous urine 10/13/2010   Suprapubic pain 10/13/2010  Anxiety and depression 10/12/2010   Back pain, thoracic 10/12/2010   Persistent posttraumatic perforation of ear drum 10/12/2010   Mycoplasma infection in conditions classified elsewhere and of unspecified site 02/19/2010   Dysuria 02/16/2010   Asthma, moderate persistent 08/11/2009   Closed fracture of neck of radius 01/08/2009   ANEMIA, IRON DEFICIENCY 01/04/2009   ANXIETY  01/04/2009   Bipolar disorder (HCC) 01/04/2009   MIGRAINE HEADACHE 01/04/2009   Essential hypertension 01/04/2009   ASTHMA 01/04/2009   ESOPHAGEAL STRICTURE 01/04/2009   GERD 01/04/2009   HIATAL HERNIA 01/04/2009   HEMATOCHEZIA 01/04/2009   Diarrhea 01/04/2009   Generalized abdominal pain 01/04/2009   Hiatal hernia 01/04/2009   Home Medication(s) Prior to Admission medications   Medication Sig Start Date End Date Taking? Authorizing Provider  albuterol (PROVENTIL HFA;VENTOLIN HFA) 108 (90 BASE) MCG/ACT inhaler Inhale 2 puffs into the lungs daily as needed for wheezing or shortness of breath.   Yes [provider]  albuterol (PROVENTIL) (2.5 MG/3ML) 0.083% nebulizer solution Take 2.5 mg by nebulization every 6 (six) hours as needed for wheezing or shortness of breath.  05/24/19  Yes [provider]  amitriptyline (ELAVIL) 100 MG tablet Take 100 mg by mouth at bedtime.   Yes [provider]  atorvastatin (LIPITOR) 20 MG tablet Take 20 mg by mouth daily.   Yes [provider]  bisacodyl (DULCOLAX) 5 MG EC tablet Take 1 tablet (5 mg total) by mouth 2 (two) times daily. Patient taking differently: Take 5 mg by mouth daily. 10/09/19  Yes Wieters, Hallie C, PA-C  budesonide-formoterol (SYMBICORT) 80-4.5 MCG/ACT inhaler Inhale 2 puffs into the lungs 2 (two) times daily. Patient taking differently: Inhale 2 puffs into the lungs daily. 03/30/14  Yes Standley Brooking, MD  busPIRone (BUSPAR) 10 MG tablet Take 10 mg by mouth 2 (two) times daily.   Yes [provider]  citalopram (CELEXA) 20 MG tablet Take 20 mg by mouth daily. 10/30/22  Yes [provider]  DULoxetine (CYMBALTA) 60 MG capsule Take 60 mg by mouth daily. 06/24/22  Yes [provider]  furosemide (LASIX) 20 MG tablet Take 1 tablet (20 mg total) by mouth daily. 03/13/22  Yes Vassie Loll, MD  gabapentin (NEURONTIN) 300 MG capsule Take 300 mg by mouth 2 (two) times daily. 04/11/19   Yes [provider]  ipratropium-albuterol (DUONEB) 0.5-2.5 (3) MG/3ML SOLN Inhale 3 mLs into the lungs as needed (wheezing/SOB). 12/06/12  Yes [provider]  levothyroxine (SYNTHROID, LEVOTHROID) 25 MCG tablet Take 25 mcg by mouth daily before breakfast.   Yes [provider]  lisinopril (PRINIVIL,ZESTRIL) 2.5 MG tablet Take 2.5 mg by mouth daily.  01/18/18  Yes [provider]  metFORMIN (GLUCOPHAGE) 500 MG tablet Take 1,000 mg by mouth 2 (two) times daily.   Yes [provider]  methocarbamol (ROBAXIN) 500 MG tablet Take 1,000 mg by mouth in the morning and at bedtime.   Yes [provider]  ondansetron (ZOFRAN) 4 MG tablet Take 4 mg by mouth every 8 (eight) hours as needed for vomiting or nausea. 06/02/19  Yes [provider]  oxycodone (ROXICODONE) 30 MG immediate release tablet Take 30 mg by mouth 4 (four) times daily as needed for pain. 09/21/22  Yes [provider]  Pancrelipase, Lip-Prot-Amyl, (ZENPEP) 20000-63000 units CPEP Take 2 capsules by mouth in the morning and at bedtime. 09/11/19  Yes [provider]  pantoprazole (PROTONIX) 40 MG tablet Take 40 mg by mouth 2 (two) times  daily.   Yes [provider]  polyethylene glycol powder (GLYCOLAX/MIRALAX) powder Take 17 g by mouth 2 (two) times daily. Until daily soft stools  OTC Patient taking differently: Take 17 g by mouth as needed for mild constipation. Until daily soft stools  OTC 03/05/15  Yes Barrett Henle, PA-C  SPIRIVA HANDIHALER 18 MCG inhalation capsule Place 18 mcg into inhaler and inhale as needed (wheezing/SOB). 04/11/19  Yes [provider]  valACYclovir (VALTREX) 500 MG tablet Take 500 mg by mouth daily.   Yes [provider]  amitriptyline (ELAVIL) 75 MG tablet Take 75 mg by mouth at bedtime as needed for sleep. Patient not taking: Reported on 11/27/2022    [provider]  citalopram (CELEXA) 40 MG  tablet Take 40 mg by mouth daily. Patient not taking: Reported on 11/26/2022 05/17/19   [provider]  magnesium oxide (MAG-OX) 400 (240 Mg) MG tablet Take 1 tablet (400 mg total) by mouth 2 (two) times daily. Patient not taking: Reported on 11/27/2022 08/26/21   Catarina Hartshorn, MD  mupirocin cream (BACTROBAN) 2 % Apply 1 application topically daily. Apply to right 2nd toe and left great toe once daily until healed. Patient not taking: Reported on 11/27/2022 03/13/20   Freddie Breech, DPM                                                                                                                                    Past Surgical History Past Surgical History:  Procedure Laterality Date   ABDOMINAL HYSTERECTOMY     attempted colonoscopy  02/2017   Dr. Margaretha Glassing: Poor prep, scope passed to mid transverse colon before colonoscopy aborted.  No significant findings noted to mid transverse colon.  Next colonoscopy in 2 years.   CELIAC PLEXUS BLOCK     CHOLECYSTECTOMY     COLONOSCOPY N/A 12/05/2012   [id number] rectum, colon and terminal ileum   ESOPHAGOGASTRODUODENOSCOPY  02/20/2008   [id number] distal esophageal mucosa, suspicious for neoplasm/Hiatal hernia otherwise normal    ESOPHAGOGASTRODUODENOSCOPY  04/03/2008   [id number] hiatal hernia, otherwise normal/Short, tight, benign-appearing peptic stricture   ESOPHAGOGASTRODUODENOSCOPY  November 16, 2012   Dr. Teena Dunk: hiatal hernia, esophagitis,    ESOPHAGOGASTRODUODENOSCOPY (EGD) WITH PROPOFOL N/A 05/09/2019   Dr. Darrick Penna: Large hiatal hernia with erythema and edema in the pouch, mild gastritis.  No biopsies taken.   ESOPHAGOGASTRODUODENOSCOPY (EGD) WITH PROPOFOL N/A 05/26/2019   Procedure: ESOPHAGOGASTRODUODENOSCOPY (EGD) WITH PROPOFOL;  Surgeon: West Bali, MD;  [facility];  Service: Endoscopy;  Laterality: N/A;   EUS  12/2018   Dr. Margaretha Glassing: LA grade B esophagitis, antritis but negative H. pylori, 1 cm ulcer at the  duodenal sweep   GIVENS CAPSULE STUDY N/A 12/05/2012   Few superficial erosions but nothing found to explain iron deficiency anemia.    GIVENS CAPSULE STUDY N/A 05/26/2019   Procedure: GIVENS CAPSULE STUDY;  Surgeon: Jonette Eva  L, MD;  Location: [facility];  Service: Endoscopy;  Laterality: N/A;   Ileocolonoscopy  06/05/2008     [id number] anal canal, otherwise normal rectum, colon   TONSILLECTOMY     Family History Family History  Problem Relation Age of Onset   Asthma Other    Asthma Paternal Grandfather    Heart attack Paternal Grandfather    Hypertension Mother    Cancer Maternal Grandmother    Diabetes Paternal Grandmother    Coronary artery disease Neg Hx    Colon cancer Neg Hx     Social History Social History   Tobacco Use   Smoking status: Every Day    Packs/day: 1.00    Years: 25.00    Additional pack years: 0.00    Total pack years: 25.00    Types: Cigarettes    Passive exposure: Current   Smokeless tobacco: Never  Vaping Use   Vaping Use: Never used  Substance Use Topics   Alcohol use: No   Drug use: No   Allergies Penicillins, Sulfa antibiotics, Aspirin, Enoxaparin, Prednisone, and Venofer [iron sucrose]  Review of Systems Review of Systems  Constitutional:  Positive for fatigue.  Musculoskeletal:  Positive for back pain.  Neurological:  Positive for headaches.    Physical Exam Vital Signs  I have reviewed the triage vital signs BP 119/69 (BP Location: Left Arm)   Pulse 80   Temp 98.1 F (36.7 C) (Oral)   Resp 16   Ht 5\' 2"  (1.575 m)   Wt 50.3 kg   SpO2 96%   BMI 20.30 kg/m   Physical Exam Vitals and nursing note reviewed.  Constitutional:      General: She is not in acute distress.    Appearance: She is well-developed. She is ill-appearing.  HENT:     Head: Normocephalic and atraumatic.  Eyes:     Conjunctiva/sclera: Conjunctivae normal.  Cardiovascular:     Rate and Rhythm: Normal rate and regular rhythm.     Heart  sounds: No murmur heard. Pulmonary:     Effort: Pulmonary effort is normal. No respiratory distress.     Breath sounds: Normal breath sounds.  Abdominal:     Palpations: Abdomen is soft.     Tenderness: There is no abdominal tenderness.  Musculoskeletal:        General: Swelling and tenderness present.     Cervical back: Neck supple.  Skin:    General: Skin is warm and dry.     Capillary Refill: Capillary refill takes less than 2 seconds.  Neurological:     Mental Status: She is alert.  Psychiatric:        Mood and Affect: Mood normal.     ED Results and Treatments Labs (all labs ordered are listed, but only abnormal results are displayed) Labs Reviewed  COMPREHENSIVE METABOLIC PANEL - Abnormal; Notable for the following components:      Result Value   Sodium 133 (*)    CO2 21 (*)    Glucose, Bld 154 (*)    BUN 21 (*)    Creatinine, Ser 1.46 (*)    Calcium 8.1 (*)    Total Protein 5.9 (*)    Albumin 3.4 (*)    GFR, Estimated 43 (*)    All other components within normal limits  CBC WITH DIFFERENTIAL/PLATELET - Abnormal; Notable for the following components:   RBC 3.29 (*)    Hemoglobin 11.3 (*)    HCT 35.2 (*)    MCV  107.0 (*)    MCH 34.3 (*)    All other components within normal limits  CBC - Abnormal; Notable for the following components:   RBC 2.45 (*)    Hemoglobin 8.5 (*)    HCT 25.2 (*)    MCV 102.9 (*)    MCH 34.7 (*)    All other components within normal limits  HEMOGLOBIN AND HEMATOCRIT, BLOOD - Abnormal; Notable for the following components:   Hemoglobin 8.8 (*)    HCT 27.6 (*)    All other components within normal limits  BASIC METABOLIC PANEL - Abnormal; Notable for the following components:   Calcium 8.0 (*)    All other components within normal limits  HEMOGLOBIN A1C - Abnormal; Notable for the following components:   Hgb A1c MFr Bld 5.8 (*)    All other components within normal limits  GLUCOSE, CAPILLARY - Abnormal; Notable for the following  components:   Glucose-Capillary 111 (*)    All other components within normal limits  GLUCOSE, CAPILLARY - Abnormal; Notable for the following components:   Glucose-Capillary 154 (*)    All other components within normal limits  I-STAT CHEM 8, ED - Abnormal; Notable for the following components:   BUN 21 (*)    Creatinine, Ser 1.40 (*)    Calcium, Ion 1.14 (*)    Hemoglobin 9.2 (*)    HCT 27.0 (*)    All other components within normal limits  CBG MONITORING, ED - Abnormal; Notable for the following components:   Glucose-Capillary 163 (*)    All other components within normal limits  HIV ANTIBODY (ROUTINE TESTING W REFLEX)  CBC  BASIC METABOLIC PANEL  TYPE AND SCREEN  TYPE AND SCREEN                                                                                                                          Radiology No results found.  Pertinent labs & imaging results that were available during my care of the patient were reviewed by me and considered in my medical decision making (see MDM for details).  Medications Ordered in ED Medications  HYDROmorphone (DILAUDID) injection 0.5 mg (0.5 mg Intravenous Given 11/27/22 1642)  melatonin tablet 3 mg (has no administration in time range)  docusate sodium (COLACE) capsule 100 mg (100 mg Oral Given 11/27/22 1228)  ondansetron (ZOFRAN-ODT) disintegrating tablet 4 mg ( Oral See Alternative 11/27/22 1130)    Or  ondansetron (ZOFRAN) injection 4 mg ( Intravenous Canceled Entry 11/27/22 1130)  amitriptyline (ELAVIL) tablet 75 mg (has no administration in time range)  atorvastatin (LIPITOR) tablet 20 mg (20 mg Oral Given 11/27/22 1227)  busPIRone (BUSPAR) tablet 10 mg (10 mg Oral Given 11/27/22 1227)  citalopram (CELEXA) tablet 20 mg ( Oral Canceled Entry 11/27/22 1119)  DULoxetine (CYMBALTA) DR capsule 60 mg (60 mg Oral Given 11/27/22 1226)  levothyroxine (SYNTHROID) tablet 25 mcg (25 mcg Oral Given 11/27/22 0627)  pantoprazole (PROTONIX) EC tablet 40  mg (40  mg Oral Given 11/27/22 1226)  albuterol (PROVENTIL) (2.5 MG/3ML) 0.083% nebulizer solution 2.5 mg (has no administration in time range)  polyethylene glycol (MIRALAX / GLYCOLAX) packet 17 g (17 g Oral Given 11/27/22 1238)  gabapentin (NEURONTIN) capsule 300 mg (300 mg Oral Given 11/27/22 1642)  oxyCODONE (Oxy IR/ROXICODONE) immediate release tablet 10-15 mg (15 mg Oral Given 11/27/22 1237)  acetaminophen (TYLENOL) tablet 1,000 mg (1,000 mg Oral Given 11/27/22 1817)  methocarbamol (ROBAXIN) tablet 750 mg (750 mg Oral Given 11/27/22 1642)  insulin aspart (novoLOG) injection 0-15 Units (3 Units Subcutaneous Given 11/27/22 1817)  feeding supplement (BOOST / RESOURCE BREEZE) liquid 1 Container (1 Container Oral Given 11/27/22 1757)  lactated ringers bolus 1,000 mL (0 mLs Intravenous Stopped 11/26/22 1407)  fentaNYL (SUBLIMAZE) injection 50 mcg (50 mcg Intravenous Given 11/26/22 1319)  lactated ringers bolus 1,000 mL (1,000 mLs Intravenous Bolus 11/26/22 1416)  albumin human 25 % solution 25 g (0 g Intravenous Stopped 11/26/22 1628)  iohexol (OMNIPAQUE) 300 MG/ML solution 100 mL (80 mLs Intravenous Contrast Given 11/26/22 1537)  sodium chloride 0.9 % bolus 1,000 mL (1,000 mLs Intravenous Bolus 11/26/22 1625)  HYDROmorphone (DILAUDID) injection 0.5 mg (0.5 mg Intravenous Given 11/26/22 1817)                                                                                                                                     Procedures .Critical Care  Performed by: Glendora Score, MD Authorized by: Glendora Score, MD   Critical care provider statement:    Critical care time (minutes):  30   Critical care was necessary to treat or prevent imminent or life-threatening deterioration of the following conditions:  Circulatory failure   Critical care was time spent personally by me on the following activities:  Development of treatment plan with patient or surrogate, discussions with consultants, evaluation of patient's response to  treatment, examination of patient, ordering and review of laboratory studies, ordering and review of radiographic studies, ordering and performing treatments and interventions, pulse oximetry, re-evaluation of patient's condition and review of old charts   (including critical care time)  Medical Decision Making / ED Course   This patient presents to the ED for concern of fall, back pain, swelling, this involves an extensive number of treatment options, and is a complaint that carries with it a high risk of complications and morbidity.  The differential diagnosis includes closed head injury, ICH, SDH, fracture, hematoma, RP bleed, acute blood loss anemia, septic shock  MDM: Patient seen emergency room for evaluation of a fall with head trauma, back pain and swelling.  Physical exam reveals a large tender fluctuant swelling in the L-spine but is otherwise unremarkable.  Aggressive fluid resuscitation begun with improvement of patient's hypotension.  Laboratory evaluation with an initial hemoglobin of 11.3, sodium 133, BUN 21, creatinine 1.46, albumin 3.4.  At time of signout, patient pending CT imaging.  Please  see provider signout for continuation of workup.   Additional history obtained: \ -External records from outside source obtained and reviewed including: Chart review including previous notes, labs, imaging, consultation notes   Lab Tests: -I ordered, reviewed, and interpreted labs.   The pertinent results include:   Labs Reviewed  COMPREHENSIVE METABOLIC PANEL - Abnormal; Notable for the following components:      Result Value   Sodium 133 (*)    CO2 21 (*)    Glucose, Bld 154 (*)    BUN 21 (*)    Creatinine, Ser 1.46 (*)    Calcium 8.1 (*)    Total Protein 5.9 (*)    Albumin 3.4 (*)    GFR, Estimated 43 (*)    All other components within normal limits  CBC WITH DIFFERENTIAL/PLATELET - Abnormal; Notable for the following components:   RBC 3.29 (*)    Hemoglobin 11.3 (*)     HCT 35.2 (*)    MCV 107.0 (*)    MCH 34.3 (*)    All other components within normal limits  CBC - Abnormal; Notable for the following components:   RBC 2.45 (*)    Hemoglobin 8.5 (*)    HCT 25.2 (*)    MCV 102.9 (*)    MCH 34.7 (*)    All other components within normal limits  HEMOGLOBIN AND HEMATOCRIT, BLOOD - Abnormal; Notable for the following components:   Hemoglobin 8.8 (*)    HCT 27.6 (*)    All other components within normal limits  BASIC METABOLIC PANEL - Abnormal; Notable for the following components:   Calcium 8.0 (*)    All other components within normal limits  HEMOGLOBIN A1C - Abnormal; Notable for the following components:   Hgb A1c MFr Bld 5.8 (*)    All other components within normal limits  GLUCOSE, CAPILLARY - Abnormal; Notable for the following components:   Glucose-Capillary 111 (*)    All other components within normal limits  GLUCOSE, CAPILLARY - Abnormal; Notable for the following components:   Glucose-Capillary 154 (*)    All other components within normal limits  I-STAT CHEM 8, ED - Abnormal; Notable for the following components:   BUN 21 (*)    Creatinine, Ser 1.40 (*)    Calcium, Ion 1.14 (*)    Hemoglobin 9.2 (*)    HCT 27.0 (*)    All other components within normal limits  CBG MONITORING, ED - Abnormal; Notable for the following components:   Glucose-Capillary 163 (*)    All other components within normal limits  HIV ANTIBODY (ROUTINE TESTING W REFLEX)  CBC  BASIC METABOLIC PANEL  TYPE AND SCREEN  TYPE AND SCREEN         Imaging Studies ordered: I ordered imaging studies including CT head, L-spine, abdomen pelvis n and these are pending   Medicines ordered and prescription drug management: Meds ordered this encounter  Medications   lactated ringers bolus 1,000 mL   fentaNYL (SUBLIMAZE) injection 50 mcg   lactated ringers bolus 1,000 mL   albumin human 25 % solution 25 g   iohexol (OMNIPAQUE) 300 MG/ML solution 100 mL   sodium  chloride 0.9 % bolus 1,000 mL   HYDROmorphone (DILAUDID) injection 0.5 mg   DISCONTD: acetaminophen (TYLENOL) tablet 650 mg   DISCONTD: oxyCODONE (Oxy IR/ROXICODONE) immediate release tablet 5 mg   HYDROmorphone (DILAUDID) injection 0.5 mg   DISCONTD: methocarbamol (ROBAXIN) tablet 500 mg   DISCONTD: methocarbamol (ROBAXIN) 500 mg in  dextrose 5 % 50 mL IVPB   melatonin tablet 3 mg   docusate sodium (COLACE) capsule 100 mg   OR Linked Order Group    ondansetron (ZOFRAN-ODT) disintegrating tablet 4 mg    ondansetron (ZOFRAN) injection 4 mg   DISCONTD: albuterol (VENTOLIN HFA) 108 (90 Base) MCG/ACT inhaler 2 puff    For shortness of breath     amitriptyline (ELAVIL) tablet 75 mg   atorvastatin (LIPITOR) tablet 20 mg   busPIRone (BUSPAR) tablet 10 mg    Pt states only once daily     citalopram (CELEXA) tablet 20 mg   DULoxetine (CYMBALTA) DR capsule 60 mg   levothyroxine (SYNTHROID) tablet 25 mcg   pantoprazole (PROTONIX) EC tablet 40 mg   DISCONTD: polyethylene glycol (MIRALAX / GLYCOLAX) packet 17 g   albuterol (PROVENTIL) (2.5 MG/3ML) 0.083% nebulizer solution 2.5 mg   polyethylene glycol (MIRALAX / GLYCOLAX) packet 17 g   gabapentin (NEURONTIN) capsule 300 mg   oxyCODONE (Oxy IR/ROXICODONE) immediate release tablet 10-15 mg   acetaminophen (TYLENOL) tablet 1,000 mg   methocarbamol (ROBAXIN) tablet 750 mg   insulin aspart (novoLOG) injection 0-15 Units    Order Specific Question:   Correction coverage:    Answer:   Moderate (average weight, post-op)    Order Specific Question:   CBG < 70:    Answer:   Implement Hypoglycemia Standing Orders and refer to Hypoglycemia Standing Orders sidebar report    Order Specific Question:   CBG 70 - 120:    Answer:   0 units    Order Specific Question:   CBG 121 - 150:    Answer:   2 units    Order Specific Question:   CBG 151 - 200:    Answer:   3 units    Order Specific Question:   CBG 201 - 250:    Answer:   5 units    Order Specific  Question:   CBG 251 - 300:    Answer:   8 units    Order Specific Question:   CBG 301 - 350:    Answer:   11 units    Order Specific Question:   CBG 351 - 400:    Answer:   15 units    Order Specific Question:   CBG > 400    Answer:   call MD and obtain STAT lab verification   feeding supplement (BOOST / RESOURCE BREEZE) liquid 1 Container    -I have reviewed the patients home medicines and have made adjustments as needed  Critical interventions Fluid resuscitation    Cardiac Monitoring: The patient was maintained on a cardiac monitor.  I personally viewed and interpreted the cardiac monitored which showed an underlying rhythm of: NSR  Social Determinants of Health:  Factors impacting patients care include: none   Reevaluation: After the interventions noted above, I reevaluated the patient and found that they have :improved  Co morbidities that complicate the patient evaluation  Past Medical History:  Diagnosis Date   Anemia    Anxiety    Asthma    Bipolar 1 disorder (HCC)    Chronic abdominal pain    COPD (chronic obstructive pulmonary disease) (HCC)    Depression    Diabetes mellitus    Diabetic neuropathy (HCC) 10/29/2017   Esophagitis    Hiatal hernia    Hyperlipidemia 02/03/2012   Iron deficiency anemia due to chronic blood loss 01/29/2022   Migraine    Neck pain 06/08/2012  Pancreatitis chronic Idiopathic   Treated by Dr. Margaretha Glassing at [facility] in the past with celiac blocks.      Dispostion: I considered admission for this patient, and disposition pending CT evaluation.  Please see provider signout for continuation of workup     Final Clinical Impression(s) / ED Diagnoses Final diagnoses:  Traumatic hematoma of lower back, initial encounter  Hypotension, unspecified hypotension type     @PCDICTATION @    Glendora Score, MD 11/27/22 1843

## 2022-11-26 NOTE — H&P (Signed)
[name] [birth year]  [id number].     HPI:  [name] is a 52 yo female scented to the [facility], ED after a fall at home.  This happened around 1 AM this morning.  She was getting out of bed to go to the bathroom and fell backwards.  She is not sure if she lost consciousness, but she thinks she hit her back on an electric fireplace in her room.  She reports that she was dizzy right before she fell and says she has been having a lot of dizziness recently when standing up.  In the ED at [facility] she was initially hypotensive with SBP in the 70s.  She was given 3 L crystalloid with improvement in blood pressure.  A CT scan showed a soft tissue lumbar hematoma.  She did not have any intracranial abnormalities.  She was transferred to the [facility] ED and has been stable since arrival. Her initial hemoglobin this afternoon was 11.3, and on repeat at 5pm was 9.2.  She had an echo in August 2023 that showed a normal EF and no wall motion abnormalities.  ROS: Review of Systems  Constitutional:  Negative for chills and fever.  HENT:  Negative for sore throat.   Respiratory:  Negative for shortness of breath and stridor.   Cardiovascular:  Negative for chest pain.  Gastrointestinal:  Negative for nausea and vomiting.  Musculoskeletal:  Positive for back pain.    Family History  Problem Relation Age of Onset   Asthma Other    Asthma Paternal Grandfather    Heart attack Paternal Grandfather    Hypertension Mother    Cancer Maternal Grandmother    Diabetes Paternal Grandmother    Coronary artery disease Neg Hx    Colon cancer Neg Hx     Past Medical History:  Diagnosis Date   Anemia    Anxiety    Asthma    Bipolar 1 disorder (HCC)    Chronic abdominal pain    COPD (chronic obstructive pulmonary disease) (HCC)    Depression    Diabetes mellitus    Diabetic neuropathy (HCC) 10/29/2017   Esophagitis    Hiatal hernia    Hyperlipidemia 02/03/2012   Iron deficiency anemia due to  chronic blood loss 01/29/2022   Migraine    Neck pain 06/08/2012   Pancreatitis chronic Idiopathic   Treated by Dr. Margaretha Glassing at [facility] in the past with celiac blocks.    Past Surgical History:  Procedure Laterality Date   ABDOMINAL HYSTERECTOMY     attempted colonoscopy  02/2017   Dr. Margaretha Glassing: Poor prep, scope passed to mid transverse colon before colonoscopy aborted.  No significant findings noted to mid transverse colon.  Next colonoscopy in 2 years.   CELIAC PLEXUS BLOCK     CHOLECYSTECTOMY     COLONOSCOPY N/A 12/05/2012   WUJ:WJXBJY rectum, colon and terminal ileum   ESOPHAGOGASTRODUODENOSCOPY  02/20/2008   NWG:NFAOZHYQM distal esophageal mucosa, suspicious for neoplasm/Hiatal hernia otherwise normal    ESOPHAGOGASTRODUODENOSCOPY  04/03/2008   VHQ:IONGEXBM-WUXLK hiatal hernia, otherwise normal/Short, tight, benign-appearing peptic stricture   ESOPHAGOGASTRODUODENOSCOPY  November 16, 2012   Dr. Teena Dunk: hiatal hernia, esophagitis,    ESOPHAGOGASTRODUODENOSCOPY (EGD) WITH PROPOFOL N/A 05/09/2019   Dr. Darrick Penna: Large hiatal hernia with erythema and edema in the pouch, mild gastritis.  No biopsies taken.   ESOPHAGOGASTRODUODENOSCOPY (EGD) WITH PROPOFOL N/A 05/26/2019   Procedure: ESOPHAGOGASTRODUODENOSCOPY (EGD) WITH PROPOFOL;  Surgeon: West Bali, MD;  Location: [facility] ENDO  SUITE;  Service: Endoscopy;  Laterality: N/A;   EUS  12/2018   Dr. Margaretha Glassing: LA grade B esophagitis, antritis but negative H. pylori, 1 cm ulcer at the duodenal sweep   GIVENS CAPSULE STUDY N/A 12/05/2012   Few superficial erosions but nothing found to explain iron deficiency anemia.    GIVENS CAPSULE STUDY N/A 05/26/2019   Procedure: GIVENS CAPSULE STUDY;  Surgeon: West Bali, MD;  Location: [facility] ENDO SUITE;  Service: Endoscopy;  Laterality: N/A;   Ileocolonoscopy  06/05/2008     BJY:NWGNFAO anal canal, otherwise normal rectum, colon   TONSILLECTOMY      Social History:  reports that she has been smoking  cigarettes. She has a 25.00 pack-year smoking history. She has been exposed to tobacco smoke. She has never used smokeless tobacco. She reports that she does not drink alcohol and does not use drugs.  Allergies:  Allergies  Allergen Reactions   Penicillins Shortness Of Breath     Has patient had a PCN reaction causing immediate rash, facial/tongue/throat swelling, SOB or lightheadedness with hypotension: Yes Has patient had a PCN reaction causing severe rash involving mucus membranes or skin necrosis: Yes Has patient had a PCN reaction that required hospitalization No Has patient had a PCN reaction occurring within the last 10 years: No  If all of the above answers are "NO", then may proceed with Cephalosporin use. Tolerated ceftriaxone   Sulfa Antibiotics Shortness Of Breath   Aspirin Nausea And Vomiting   Enoxaparin Other (See Comments)    Has been known to cause her blood clots in her legs Has been known to cause her blood clots in her legs   Lovenox  [Enoxaparin Sodium] Other (See Comments)    Has been known to cause her blood clots in her legs   Prednisone Other (See Comments)    Pancreatitis, but has to take for asthma sometimes   Venofer [Iron Sucrose] Nausea Only and Swelling    (Not in a hospital admission)    Physical Exam: Blood pressure (!) 95/53, pulse 94, temperature 97.8 F (36.6 C), temperature source Oral, resp. rate 18, height 5\' 2"  (1.575 m), weight 50.3 kg, SpO2 100 %. General: resting comfortably, appears stated age, no apparent distress Neurological: alert and oriented, no focal deficits, cranial nerves grossly in tact HEENT: normocephalic, atraumatic, no cervical spinal tenderness or stepoffs CV: regular rate and rhythm, extremities warm and well-perfused Respiratory: normal work of breathing on room air, no chest wall deformities Abdomen: soft, nondistended, nontender to deep palpation. No masses or organomegaly. Extremities: warm and well-perfused, no  deformities, moving all extremities spontaneously. Psychiatric: normal mood and affect Skin: warm and dry, large soft tissue hematoma on the left lower back, tender to palpation.   Results for orders placed or performed during the hospital encounter of 11/26/22 (from the past 48 hour(s))  Comprehensive metabolic panel     Status: Abnormal   Collection Time: 11/26/22  1:03 PM  Result Value Ref Range   Sodium 133 (L) 135 - 145 mmol/L   Potassium 4.3 3.5 - 5.1 mmol/L   Chloride 99 98 - 111 mmol/L   CO2 21 (L) 22 - 32 mmol/L   Glucose, Bld 154 (H) 70 - 99 mg/dL    Comment: Glucose reference range applies only to samples taken after fasting for at least 8 hours.   BUN 21 (H) 6 - 20 mg/dL   Creatinine, Ser 1.30 (H) 0.44 - 1.00 mg/dL   Calcium 8.1 (L) 8.9 -  10.3 mg/dL   Total Protein 5.9 (L) 6.5 - 8.1 g/dL   Albumin 3.4 (L) 3.5 - 5.0 g/dL   AST 29 15 - 41 U/L   ALT 27 0 - 44 U/L   Alkaline Phosphatase 69 38 - 126 U/L   Total Bilirubin 0.6 0.3 - 1.2 mg/dL   GFR, Estimated 43 (L) >60 mL/min    Comment: (NOTE) Calculated using the CKD-EPI Creatinine Equation (2021)    Anion gap 13 5 - 15    Comment: Performed at [facility], [address] [place], [place] [address]  CBC with Differential     Status: Abnormal   Collection Time: 11/26/22  1:03 PM  Result Value Ref Range   WBC 9.9 4.0 - 10.5 K/uL   RBC 3.29 (L) 3.87 - 5.11 MIL/uL   Hemoglobin 11.3 (L) 12.0 - 15.0 g/dL   HCT 29.5 (L) 62.1 - 30.8 %   MCV 107.0 (H) 80.0 - 100.0 fL   MCH 34.3 (H) 26.0 - 34.0 pg   MCHC 32.1 30.0 - 36.0 g/dL   RDW 65.7 84.6 - 96.2 %   Platelets 215 150 - 400 K/uL   nRBC 0.0 0.0 - 0.2 %   Neutrophils Relative % 68 %   Neutro Abs 6.7 1.7 - 7.7 K/uL   Lymphocytes Relative 21 %   Lymphs Abs 2.1 0.7 - 4.0 K/uL   Monocytes Relative 6 %   Monocytes Absolute 0.6 0.1 - 1.0 K/uL   Eosinophils Relative 5 %   Eosinophils Absolute 0.5 0.0 - 0.5 K/uL   Basophils Relative 0 %   Basophils Absolute 0.0 0.0 - 0.1  K/uL   Immature Granulocytes 0 %   Abs Immature Granulocytes 0.04 0.00 - 0.07 K/uL    Comment: Performed at [facility], [address], [place], [place] [address]  I-stat chem 8, ED (not at [facility], [facility] or [facility])     Status: Abnormal   Collection Time: 11/26/22  5:13 PM  Result Value Ref Range   Sodium 139 135 - 145 mmol/L   Potassium 4.4 3.5 - 5.1 mmol/L   Chloride 102 98 - 111 mmol/L   BUN 21 (H) 6 - 20 mg/dL   Creatinine, Ser 1.32 (H) 0.44 - 1.00 mg/dL   Glucose, Bld 90 70 - 99 mg/dL    Comment: Glucose reference range applies only to samples taken after fasting for at least 8 hours.   Calcium, Ion 1.14 (L) 1.15 - 1.40 mmol/L   TCO2 26 22 - 32 mmol/L   Hemoglobin 9.2 (L) 12.0 - 15.0 g/dL   HCT 44.0 (L) 10.2 - 72.5 %  Type and screen     Status: None   Collection Time: 11/26/22  5:18 PM  Result Value Ref Range   ABO/RH(D) A POS    Antibody Screen NEG    Sample Expiration      11/29/2022,2359 Performed at [facility], [address] [place], [place] [address]   Type and screen [facility]     Status: None (Preliminary result)   Collection Time: 11/26/22  9:31 PM  Result Value Ref Range   ABO/RH(D) PENDING    Antibody Screen PENDING    Sample Expiration      11/29/2022,2359 Performed at [facility], [address] [place], [place] [address]    CT L-SPINE NO CHARGE  Result Date: 11/26/2022 CLINICAL DATA:  Trauma EXAM: CT LUMBAR SPINE WITHOUT CONTRAST TECHNIQUE: Multidetector CT imaging of the lumbar  spine was performed without intravenous contrast administration. Multiplanar CT image reconstructions were also generated. RADIATION DOSE REDUCTION: This exam was performed according to the departmental dose-optimization program which includes automated exposure control, adjustment of the mA and/or kV according to patient size and/or use of iterative reconstruction technique. COMPARISON:  CT Chest 08/25/21, CT L Spine 02/16/22 FINDINGS: Segmentation: 5 lumbar type  vertebrae. Alignment: Normal. Vertebrae: Redemonstrated is a superior endplate compression deformity at L1, with interval increase in height loss compared to 2023. There is also increased sclerosis of of the superior endplate. Paraspinal and other soft tissues: There is a large soft tissue hematoma in the subcutaneous soft tissues overlying the lower back (series 10, image 72) measuring proximally 8.9 x 3.7 x 10.1 cm. See separately dictated CT chest abdomen pelvis for additional intra-abdominal findings. Disc levels: No CT evidence of high-grade spinal canal stenosis. No evidence of high-grade neural foraminal stenosis IMPRESSION: 1. Redemonstrated superior endplate compression deformity at L1, with interval increase in height loss compared to 2023. There is also interval increase in sclerosis of the superior endplate. While these findings could represent evolving posttraumatic changes, correlation with ESR and CRP is recommended to exclude the possibility of infection. 2. Large soft tissue hematoma in the subcutaneous soft tissues overlying the lower back, measuring 8.9 x 3.7 x 10.1 cm. Electronically Signed   By: Lorenza Cambridge M.D.   On: 11/26/2022 17:05   CT ABDOMEN PELVIS W CONTRAST  Result Date: 11/26/2022 CLINICAL DATA:  Soft tissue swelling near the left spine. Hypotensive. Evaluate for retroperitoneal hematoma. EXAM: CT ABDOMEN AND PELVIS WITH CONTRAST TECHNIQUE: Multidetector CT imaging of the abdomen and pelvis was performed using the standard protocol following bolus administration of intravenous contrast. RADIATION DOSE REDUCTION: This exam was performed according to the departmental dose-optimization program which includes automated exposure control, adjustment of the mA and/or kV according to patient size and/or use of iterative reconstruction technique. CONTRAST:  80mL OMNIPAQUE IOHEXOL 300 MG/ML  SOLN COMPARISON:  CT abdomen and pelvis 03/06/2022 FINDINGS: Lower chest: There is atelectasis in the  lung bases. Hepatobiliary: No focal liver abnormality is seen. Status post cholecystectomy. No biliary dilatation. Pancreas: Unremarkable. No pancreatic ductal dilatation or surrounding inflammatory changes. Spleen: Normal in size without focal abnormality. Adrenals/Urinary Tract: Adrenal glands are unremarkable. Kidneys are normal, without renal calculi, focal lesion, or hydronephrosis. Bladder is unremarkable. Stomach/Bowel: Large hiatal hernia again seen. The stomach is moderately distended. Appendix not seen. No evidence of bowel wall thickening, distention, or inflammatory changes. There is a large amount of stool throughout the colon. Vascular/Lymphatic: No significant vascular findings are present. No enlarged abdominal or pelvic lymph nodes. Reproductive: Uterus is smaller absent. Adnexa are within normal limits. Other: Subcutaneous heterogeneous hyperdense collection is seen in the posterior lumbar soft tissues measuring 8.5 x 3.8 x 9.4 cm with surrounding edema and swelling most compatible with hematoma. No underlying intramuscular abnormality. No fluid collection or soft tissue gas. Musculoskeletal: No acute fractures. Chronic compression deformities of T12 and L1 are unchanged. IMPRESSION: 1. Subcutaneous hematoma in the posterior lumbar soft tissues measuring up to 9.4 cm. 2. No other acute localizing process in the abdomen or pelvis. 3. Stable large hiatal hernia. 4. Large amount of stool throughout the colon. Electronically Signed   By: Darliss Cheney M.D.   On: 11/26/2022 16:56   CT Head Wo Contrast  Result Date: 11/26/2022 CLINICAL DATA:  Trauma EXAM: CT HEAD WITHOUT CONTRAST TECHNIQUE: Contiguous axial images were obtained from the base of the skull  through the vertex without intravenous contrast. RADIATION DOSE REDUCTION: This exam was performed according to the departmental dose-optimization program which includes automated exposure control, adjustment of the mA and/or kV according to patient  size and/or use of iterative reconstruction technique. COMPARISON:  CT Head 08/24/21 FINDINGS: Brain: No evidence of acute infarction, hemorrhage, hydrocephalus, extra-axial collection or mass lesion/mass effect. Vascular: No hyperdense vessel or unexpected calcification. Skull: Normal. Negative for fracture or focal lesion. Sinuses/Orbits: No middle ear or mastoid effusion. Paranasal sinuses are clear. Orbits are unremarkable. Other: None. IMPRESSION: No acute intracranial abnormality. Electronically Signed   By: Lorenza Cambridge M.D.   On: 11/26/2022 16:55      Assessment/Plan 52 yo female presenting after a fall at home with a large soft tissue hematoma in the lower back with transient hypotension. - Downtrending hgb, currently hemodynamically stable. Repeat Hgb/hct tonight and again in the morning. - Type and screen completed - Regular diet - Pain control - VTE: SCDs, hold chemical DVT ppx in setting of hematoma - Admit to observation   Sophronia Simas, MD [facility] General, Hepatobiliary and Pancreatic Surgery 11/26/22 9:56 PM

## 2022-11-26 NOTE — ED Provider Notes (Signed)
8:22 PM Patient transferred from [facility].  She had a fall last night and landed on her back.  She has a large hematoma in her lower back.  Hypotension on arrival to [facility], treated, currently improved.  She takes chronic pain medication.  Secure message sent to Dr. Freida Busman of trauma surgery who replied that she will see patient.  Patient currently stable, complaining of back discomfort.  She would like something to eat when possible.  BP 105/60 (BP Location: Left Arm)   Pulse 93   Temp 97.8 F (36.6 C) (Oral)   Resp 19   Ht 5\' 2"  (1.575 m)   Wt 50.3 kg   SpO2 97%   BMI 20.30 kg/m     Renne Crigler, PA-C 11/26/22 2023    Melene Plan, DO 11/26/22 2144

## 2022-11-26 NOTE — ED Notes (Signed)
Patient transported to CT 

## 2022-11-26 NOTE — ED Notes (Signed)
ED TO INPATIENT HANDOFF REPORT  ED Nurse Name and Phone #: Murlean Iba PM [phone]   S Name/Age/Gender [name] 52 y.o. female Room/Bed: 029C/029C  Code Status   Code Status: Full Code  Home/SNF/Other Home Patient oriented to: self, place, time, and situation Is this baseline? Yes   Triage Complete: Triage complete  Chief Complaint Fall [W19.XXXA]   Triage Note Pt fell last night after losing her balance, states she had taken a sleeping pill. C/o lower back pain.  Tender to right temple area, pt unsure if she hit her head.  Pt denies taking blood thinners. Swelling noted to left lower back.    Allergies Allergies  Allergen Reactions   Penicillins Shortness Of Breath     Has patient had a PCN reaction causing immediate rash, facial/tongue/throat swelling, SOB or lightheadedness with hypotension: Yes Has patient had a PCN reaction causing severe rash involving mucus membranes or skin necrosis: Yes Has patient had a PCN reaction that required hospitalization No Has patient had a PCN reaction occurring within the last 10 years: No  If all of the above answers are "NO", then may proceed with Cephalosporin use. Tolerated ceftriaxone   Sulfa Antibiotics Shortness Of Breath   Aspirin Nausea And Vomiting   Enoxaparin Other (See Comments)    Has been known to cause her blood clots in her legs Has been known to cause her blood clots in her legs   Lovenox  [Enoxaparin Sodium] Other (See Comments)    Has been known to cause her blood clots in her legs   Prednisone Other (See Comments)    Pancreatitis, but has to take for asthma sometimes   Venofer [Iron Sucrose] Nausea Only and Swelling    Level of Care/Admitting Diagnosis ED Disposition     ED Disposition  Admit   Condition  --   Comment  Hospital Area: [facility] [[id number]]  Level of Care: Med-Surg [16]  May place patient in observation at [facility] or [facility] if equivalent level of care is  available:: No  Covid Evaluation: Asymptomatic - no recent exposure (last 10 days) testing not required  Diagnosis: Fall [[id number]]  Admitting Physician: Fritzi Mandes [[id number]]  Attending Physician: TRAUMA MD [[id number]]          B Medical/Surgery History Past Medical History:  Diagnosis Date   Anemia    Anxiety    Asthma    Bipolar 1 disorder (HCC)    Chronic abdominal pain    COPD (chronic obstructive pulmonary disease) (HCC)    Depression    Diabetes mellitus    Diabetic neuropathy (HCC) 10/29/2017   Esophagitis    Hiatal hernia    Hyperlipidemia 02/03/2012   Iron deficiency anemia due to chronic blood loss 01/29/2022   Migraine    Neck pain 06/08/2012   Pancreatitis chronic Idiopathic   Treated by Dr. Margaretha Glassing at [facility] in the past with celiac blocks.   Past Surgical History:  Procedure Laterality Date   ABDOMINAL HYSTERECTOMY     attempted colonoscopy  02/2017   Dr. Margaretha Glassing: Poor prep, scope passed to mid transverse colon before colonoscopy aborted.  No significant findings noted to mid transverse colon.  Next colonoscopy in 2 years.   CELIAC PLEXUS BLOCK     CHOLECYSTECTOMY     COLONOSCOPY N/A 12/05/2012   XBJ:YNWGNF rectum, colon and terminal ileum   ESOPHAGOGASTRODUODENOSCOPY  02/20/2008   AOZ:HYQMVHQIO distal esophageal mucosa, suspicious for neoplasm/Hiatal hernia otherwise normal  ESOPHAGOGASTRODUODENOSCOPY  04/03/2008   ZOX:[id number] hiatal hernia, otherwise normal/Short, tight, benign-appearing peptic stricture   ESOPHAGOGASTRODUODENOSCOPY  November 16, 2012   Dr. Teena Dunk: hiatal hernia, esophagitis,    ESOPHAGOGASTRODUODENOSCOPY (EGD) WITH PROPOFOL N/A 05/09/2019   Dr. Darrick Penna: Large hiatal hernia with erythema and edema in the pouch, mild gastritis.  No biopsies taken.   ESOPHAGOGASTRODUODENOSCOPY (EGD) WITH PROPOFOL N/A 05/26/2019   Procedure: ESOPHAGOGASTRODUODENOSCOPY (EGD) WITH PROPOFOL;  Surgeon: West Bali, MD;  [facility];   Service: Endoscopy;  Laterality: N/A;   EUS  12/2018   Dr. Margaretha Glassing: LA grade B esophagitis, antritis but negative H. pylori, 1 cm ulcer at the duodenal sweep   GIVENS CAPSULE STUDY N/A 12/05/2012   Few superficial erosions but nothing found to explain iron deficiency anemia.    GIVENS CAPSULE STUDY N/A 05/26/2019   Procedure: GIVENS CAPSULE STUDY;  Surgeon: West Bali, MD;  Location: [facility];  Service: Endoscopy;  Laterality: N/A;   Ileocolonoscopy  06/05/2008     JYN:WGNFAOZ anal canal, otherwise normal rectum, colon   TONSILLECTOMY       A IV Location/Drains/Wounds Patient Lines/Drains/Airways Status     Active Line/Drains/Airways     Name Placement date Placement time Site Days   Peripheral IV 11/26/22 22 G 1" Left Antecubital 11/26/22  1317  Antecubital  less than 1   Peripheral IV 11/26/22 22 G 1" Anterior;Left;Upper Antecubital 11/26/22  1716  Antecubital  less than 1            Intake/Output Last 24 hours No intake or output data in the 24 hours ending 11/26/22 2221  Labs/Imaging Results for orders placed or performed during the hospital encounter of 11/26/22 (from the past 48 hour(s))  Comprehensive metabolic panel     Status: Abnormal   Collection Time: 11/26/22  1:03 PM  Result Value Ref Range   Sodium 133 (L) 135 - 145 mmol/L   Potassium 4.3 3.5 - 5.1 mmol/L   Chloride 99 98 - 111 mmol/L   CO2 21 (L) 22 - 32 mmol/L   Glucose, Bld 154 (H) 70 - 99 mg/dL    Comment: Glucose reference range applies only to samples taken after fasting for at least 8 hours.   BUN 21 (H) 6 - 20 mg/dL   Creatinine, Ser 3.08 (H) 0.44 - 1.00 mg/dL   Calcium 8.1 (L) 8.9 - 10.3 mg/dL   Total Protein 5.9 (L) 6.5 - 8.1 g/dL   Albumin 3.4 (L) 3.5 - 5.0 g/dL   AST 29 15 - 41 U/L   ALT 27 0 - 44 U/L   Alkaline Phosphatase 69 38 - 126 U/L   Total Bilirubin 0.6 0.3 - 1.2 mg/dL   GFR, Estimated 43 (L) >60 mL/min    Comment: (NOTE) Calculated using the CKD-EPI Creatinine Equation  (2021)    Anion gap 13 5 - 15    Comment: Performed at [facility], [address], [place], [place] [address]  CBC with Differential     Status: Abnormal   Collection Time: 11/26/22  1:03 PM  Result Value Ref Range   WBC 9.9 4.0 - 10.5 K/uL   RBC 3.29 (L) 3.87 - 5.11 MIL/uL   Hemoglobin 11.3 (L) 12.0 - 15.0 g/dL   HCT 69.6 (L) 29.5 - 28.4 %   MCV 107.0 (H) 80.0 - 100.0 fL   MCH 34.3 (H) 26.0 - 34.0 pg   MCHC 32.1 30.0 - 36.0 g/dL   RDW 13.2 44.0 -  15.5 %   Platelets 215 150 - 400 K/uL   nRBC 0.0 0.0 - 0.2 %   Neutrophils Relative % 68 %   Neutro Abs 6.7 1.7 - 7.7 K/uL   Lymphocytes Relative 21 %   Lymphs Abs 2.1 0.7 - 4.0 K/uL   Monocytes Relative 6 %   Monocytes Absolute 0.6 0.1 - 1.0 K/uL   Eosinophils Relative 5 %   Eosinophils Absolute 0.5 0.0 - 0.5 K/uL   Basophils Relative 0 %   Basophils Absolute 0.0 0.0 - 0.1 K/uL   Immature Granulocytes 0 %   Abs Immature Granulocytes 0.04 0.00 - 0.07 K/uL    Comment: Performed at [facility], [address], [place], [place] [address]  I-stat chem 8, ED (not at [facility], [facility] or [facility])     Status: Abnormal   Collection Time: 11/26/22  5:13 PM  Result Value Ref Range   Sodium 139 135 - 145 mmol/L   Potassium 4.4 3.5 - 5.1 mmol/L   Chloride 102 98 - 111 mmol/L   BUN 21 (H) 6 - 20 mg/dL   Creatinine, Ser 6.04 (H) 0.44 - 1.00 mg/dL   Glucose, Bld 90 70 - 99 mg/dL    Comment: Glucose reference range applies only to samples taken after fasting for at least 8 hours.   Calcium, Ion 1.14 (L) 1.15 - 1.40 mmol/L   TCO2 26 22 - 32 mmol/L   Hemoglobin 9.2 (L) 12.0 - 15.0 g/dL   HCT 54.0 (L) 98.1 - 19.1 %  Type and screen     Status: None   Collection Time: 11/26/22  5:18 PM  Result Value Ref Range   ABO/RH(D) A POS    Antibody Screen NEG    Sample Expiration      11/29/2022,2359 Performed at [facility], [address] [place], [place] [address]   Type and screen [facility]     Status: None   Collection Time: 11/26/22   9:31 PM  Result Value Ref Range   ABO/RH(D) A POS    Antibody Screen NEG    Sample Expiration      11/29/2022,2359 Performed at [facility], [address], [place], [place] [address]    CT L-SPINE NO CHARGE  Result Date: 11/26/2022 CLINICAL DATA:  Trauma EXAM: CT LUMBAR SPINE WITHOUT CONTRAST TECHNIQUE: Multidetector CT imaging of the lumbar spine was performed without intravenous contrast administration. Multiplanar CT image reconstructions were also generated. RADIATION DOSE REDUCTION: This exam was performed according to the departmental dose-optimization program which includes automated exposure control, adjustment of the mA and/or kV according to patient size and/or use of iterative reconstruction technique. COMPARISON:  CT Chest 08/25/21, CT L Spine 02/16/22 FINDINGS: Segmentation: 5 lumbar type vertebrae. Alignment: Normal. Vertebrae: Redemonstrated is a superior endplate compression deformity at L1, with interval increase in height loss compared to 2023. There is also increased sclerosis of of the superior endplate. Paraspinal and other soft tissues: There is a large soft tissue hematoma in the subcutaneous soft tissues overlying the lower back (series 10, image 72) measuring proximally 8.9 x 3.7 x 10.1 cm. See separately dictated CT chest abdomen pelvis for additional intra-abdominal findings. Disc levels: No CT evidence of high-grade spinal canal stenosis. No evidence of high-grade neural foraminal stenosis IMPRESSION: 1. Redemonstrated superior endplate compression deformity at L1, with interval increase in height loss compared to 2023. There is also interval increase in sclerosis of the superior endplate. While these findings could represent evolving  posttraumatic changes, correlation with ESR and CRP is recommended to exclude the possibility of infection. 2. Large soft tissue hematoma in the subcutaneous soft tissues overlying the lower back, measuring 8.9 x 3.7 x 10.1 cm. Electronically  Signed   By: Lorenza Cambridge M.D.   On: 11/26/2022 17:05   CT ABDOMEN PELVIS W CONTRAST  Result Date: 11/26/2022 CLINICAL DATA:  Soft tissue swelling near the left spine. Hypotensive. Evaluate for retroperitoneal hematoma. EXAM: CT ABDOMEN AND PELVIS WITH CONTRAST TECHNIQUE: Multidetector CT imaging of the abdomen and pelvis was performed using the standard protocol following bolus administration of intravenous contrast. RADIATION DOSE REDUCTION: This exam was performed according to the departmental dose-optimization program which includes automated exposure control, adjustment of the mA and/or kV according to patient size and/or use of iterative reconstruction technique. CONTRAST:  80mL OMNIPAQUE IOHEXOL 300 MG/ML  SOLN COMPARISON:  CT abdomen and pelvis 03/06/2022 FINDINGS: Lower chest: There is atelectasis in the lung bases. Hepatobiliary: No focal liver abnormality is seen. Status post cholecystectomy. No biliary dilatation. Pancreas: Unremarkable. No pancreatic ductal dilatation or surrounding inflammatory changes. Spleen: Normal in size without focal abnormality. Adrenals/Urinary Tract: Adrenal glands are unremarkable. Kidneys are normal, without renal calculi, focal lesion, or hydronephrosis. Bladder is unremarkable. Stomach/Bowel: Large hiatal hernia again seen. The stomach is moderately distended. Appendix not seen. No evidence of bowel wall thickening, distention, or inflammatory changes. There is a large amount of stool throughout the colon. Vascular/Lymphatic: No significant vascular findings are present. No enlarged abdominal or pelvic lymph nodes. Reproductive: Uterus is smaller absent. Adnexa are within normal limits. Other: Subcutaneous heterogeneous hyperdense collection is seen in the posterior lumbar soft tissues measuring 8.5 x 3.8 x 9.4 cm with surrounding edema and swelling most compatible with hematoma. No underlying intramuscular abnormality. No fluid collection or soft tissue gas.  Musculoskeletal: No acute fractures. Chronic compression deformities of T12 and L1 are unchanged. IMPRESSION: 1. Subcutaneous hematoma in the posterior lumbar soft tissues measuring up to 9.4 cm. 2. No other acute localizing process in the abdomen or pelvis. 3. Stable large hiatal hernia. 4. Large amount of stool throughout the colon. Electronically Signed   By: Darliss Cheney M.D.   On: 11/26/2022 16:56   CT Head Wo Contrast  Result Date: 11/26/2022 CLINICAL DATA:  Trauma EXAM: CT HEAD WITHOUT CONTRAST TECHNIQUE: Contiguous axial images were obtained from the base of the skull through the vertex without intravenous contrast. RADIATION DOSE REDUCTION: This exam was performed according to the departmental dose-optimization program which includes automated exposure control, adjustment of the mA and/or kV according to patient size and/or use of iterative reconstruction technique. COMPARISON:  CT Head 08/24/21 FINDINGS: Brain: No evidence of acute infarction, hemorrhage, hydrocephalus, extra-axial collection or mass lesion/mass effect. Vascular: No hyperdense vessel or unexpected calcification. Skull: Normal. Negative for fracture or focal lesion. Sinuses/Orbits: No middle ear or mastoid effusion. Paranasal sinuses are clear. Orbits are unremarkable. Other: None. IMPRESSION: No acute intracranial abnormality. Electronically Signed   By: Lorenza Cambridge M.D.   On: 11/26/2022 16:55    Pending Labs Unresulted Labs (From admission, onward)     Start     Ordered   11/27/22 0500  CBC  Tomorrow morning,   R        11/26/22 2130   11/27/22 0500  Basic metabolic panel  Tomorrow morning,   R        11/26/22 2130   11/26/22 2200  Hemoglobin and hematocrit, blood  Once-Timed,   TIMED  11/26/22 2130   11/26/22 2126  HIV Antibody (routine testing w rflx)  (HIV Antibody (Routine testing w reflex) panel)  Once,   R        11/26/22 2130            Vitals/Pain Today's Vitals   11/26/22 1946 11/26/22 2000  11/26/22 2030 11/26/22 2100  BP: 105/60 116/68 99/64 (!) 95/53  Pulse: 93 93 95 94  Resp: 19 (!) 24 11 18   Temp: 97.8 F (36.6 C)     TempSrc: Oral     SpO2: 97% 97% 98% 100%  Weight:      Height:      PainSc:        Isolation Precautions No active isolations  Medications Medications  acetaminophen (TYLENOL) tablet 650 mg (has no administration in time range)  oxyCODONE (Oxy IR/ROXICODONE) immediate release tablet 5 mg (has no administration in time range)  HYDROmorphone (DILAUDID) injection 0.5 mg (has no administration in time range)  methocarbamol (ROBAXIN) tablet 500 mg (has no administration in time range)    Or  methocarbamol (ROBAXIN) 500 mg in dextrose 5 % 50 mL IVPB (has no administration in time range)  melatonin tablet 3 mg (has no administration in time range)  docusate sodium (COLACE) capsule 100 mg (has no administration in time range)  ondansetron (ZOFRAN-ODT) disintegrating tablet 4 mg (has no administration in time range)    Or  ondansetron (ZOFRAN) injection 4 mg (has no administration in time range)  amitriptyline (ELAVIL) tablet 75 mg (has no administration in time range)  atorvastatin (LIPITOR) tablet 20 mg (has no administration in time range)  busPIRone (BUSPAR) tablet 10 mg (has no administration in time range)  citalopram (CELEXA) tablet 20 mg (has no administration in time range)  DULoxetine (CYMBALTA) DR capsule 60 mg (has no administration in time range)  levothyroxine (SYNTHROID) tablet 25 mcg (has no administration in time range)  pantoprazole (PROTONIX) EC tablet 40 mg (has no administration in time range)  polyethylene glycol (MIRALAX / GLYCOLAX) packet 17 g (has no administration in time range)  albuterol (PROVENTIL) (2.5 MG/3ML) 0.083% nebulizer solution 2.5 mg (has no administration in time range)  lactated ringers bolus 1,000 mL (0 mLs Intravenous Stopped 11/26/22 1407)  fentaNYL (SUBLIMAZE) injection 50 mcg (50 mcg Intravenous Given 11/26/22  1319)  lactated ringers bolus 1,000 mL (1,000 mLs Intravenous Bolus 11/26/22 1416)  albumin human 25 % solution 25 g (0 g Intravenous Stopped 11/26/22 1628)  iohexol (OMNIPAQUE) 300 MG/ML solution 100 mL (80 mLs Intravenous Contrast Given 11/26/22 1537)  sodium chloride 0.9 % bolus 1,000 mL (1,000 mLs Intravenous Bolus 11/26/22 1625)  HYDROmorphone (DILAUDID) injection 0.5 mg (0.5 mg Intravenous Given 11/26/22 1817)    Mobility walks     Focused Assessments     R Recommendations: See Admitting Provider Note  Report given to:   Additional Notes:

## 2022-11-26 NOTE — ED Notes (Signed)
Attempting IV access, RN certified in ultrasound IV attempting access

## 2022-11-27 DIAGNOSIS — E440 Moderate protein-calorie malnutrition: Secondary | ICD-10-CM | POA: Insufficient documentation

## 2022-11-27 DIAGNOSIS — S300XXA Contusion of lower back and pelvis, initial encounter: Secondary | ICD-10-CM | POA: Diagnosis not present

## 2022-11-27 LAB — BASIC METABOLIC PANEL
Anion gap: 5 (ref 5–15)
BUN: 13 mg/dL (ref 6–20)
CO2: 25 mmol/L (ref 22–32)
Calcium: 8 mg/dL — ABNORMAL LOW (ref 8.9–10.3)
Chloride: 109 mmol/L (ref 98–111)
Creatinine, Ser: 0.96 mg/dL (ref 0.44–1.00)
GFR, Estimated: >60 mL/min (ref >60)
Glucose, Bld: 99 mg/dL (ref 70–99)
Potassium: 3.7 mmol/L (ref 3.5–5.1)
Sodium: 139 mmol/L (ref 135–145)

## 2022-11-27 LAB — CBC
HCT: 25.2 % — ABNORMAL LOW (ref 36.0–46.0)
Hemoglobin: 8.5 g/dL — ABNORMAL LOW (ref 12.0–15.0)
MCH: 34.7 pg — ABNORMAL HIGH (ref 26.0–34.0)
MCHC: 33.7 g/dL (ref 30.0–36.0)
MCV: 102.9 fL — ABNORMAL HIGH (ref 80.0–100.0)
Platelets: 172 10*3/uL (ref 150–400)
RBC: 2.45 MIL/uL — ABNORMAL LOW (ref 3.87–5.11)
RDW: 12 % (ref 11.5–15.5)
WBC: 6.4 10*3/uL (ref 4.0–10.5)
nRBC: 0 % (ref 0.0–0.2)

## 2022-11-27 LAB — GLUCOSE, CAPILLARY
Glucose-Capillary: 111 mg/dL — ABNORMAL HIGH (ref 70–99)
Glucose-Capillary: 154 mg/dL — ABNORMAL HIGH (ref 70–99)

## 2022-11-27 LAB — HEMOGLOBIN A1C
Hgb A1c MFr Bld: 5.8 % — ABNORMAL HIGH (ref 4.8–5.6)
Mean Plasma Glucose: 119.76 mg/dL

## 2022-11-27 LAB — HIV ANTIBODY (ROUTINE TESTING W REFLEX): HIV Screen 4th Generation wRfx: NONREACTIVE

## 2022-11-27 MED ORDER — POLYETHYLENE GLYCOL 3350 17 G PO PACK
17.0000 g | PACK | Freq: Two times a day (BID) | ORAL | Status: DC
  Administered 2022-11-27 – 2022-11-28 (×3): 17 g via ORAL
  Filled 2022-11-27 (×3): qty 1

## 2022-11-27 MED ORDER — INSULIN ASPART 100 UNIT/ML IJ SOLN
0.0000 [IU] | Freq: Three times a day (TID) | INTRAMUSCULAR | Status: DC
  Administered 2022-11-27: 3 [IU] via SUBCUTANEOUS
  Administered 2022-11-28: 2 [IU] via SUBCUTANEOUS

## 2022-11-27 MED ORDER — ACETAMINOPHEN 500 MG PO TABS
1000.0000 mg | ORAL_TABLET | Freq: Four times a day (QID) | ORAL | Status: DC
  Administered 2022-11-27 – 2022-11-28 (×4): 1000 mg via ORAL
  Filled 2022-11-27 (×5): qty 2

## 2022-11-27 MED ORDER — BOOST / RESOURCE BREEZE PO LIQD CUSTOM
1.0000 | ORAL | Status: DC
  Administered 2022-11-27: 1 via ORAL

## 2022-11-27 MED ORDER — METHOCARBAMOL 750 MG PO TABS
750.0000 mg | ORAL_TABLET | Freq: Three times a day (TID) | ORAL | Status: DC
  Administered 2022-11-27 – 2022-11-28 (×4): 750 mg via ORAL
  Filled 2022-11-27 (×4): qty 1

## 2022-11-27 MED ORDER — OXYCODONE HCL 5 MG PO TABS
10.0000 mg | ORAL_TABLET | Freq: Four times a day (QID) | ORAL | Status: DC | PRN
  Administered 2022-11-27 – 2022-11-28 (×2): 15 mg via ORAL
  Filled 2022-11-27 (×3): qty 3

## 2022-11-27 MED ORDER — GABAPENTIN 300 MG PO CAPS
300.0000 mg | ORAL_CAPSULE | Freq: Three times a day (TID) | ORAL | Status: DC
  Administered 2022-11-27 – 2022-11-28 (×5): 300 mg via ORAL
  Filled 2022-11-27 (×4): qty 1

## 2022-11-27 NOTE — TOC CAGE-AID Note (Signed)
Transition of Care [facility]) - CAGE-AID Screening   Patient Details  Name: [name] MRN: [id number] Date of Birth: [birth year]  [facility] CM/SW Contact:    Leota Sauers, RN Phone Number: 11/27/2022, 12:36 AM   Clinical Narrative:  Patient denies use of alcohol or drugs. Education not offered at this time.  CAGE-AID Screening:    Have You Ever Felt You Ought to Cut Down on Your Drinking or Drug Use?: No Have People Annoyed You By Critizing Your Drinking Or Drug Use?: No Have You Felt Bad Or Guilty About Your Drinking Or Drug Use?: No Have You Ever Had a Drink or Used Drugs First Thing In The Morning to Steady Your Nerves or to Get Rid of a Hangover?: No CAGE-AID Score: 0  Substance Abuse Education Offered: No

## 2022-11-27 NOTE — Care Management Obs Status (Signed)
MEDICARE OBSERVATION STATUS NOTIFICATION   Patient Details  Name: NOELANI LEFORT MRN: [id number] Date of Birth: [birth year]   Medicare Observation Status Notification Given:  Yes    Glennon Mac, RN 11/27/2022, 3:36 PM

## 2022-11-27 NOTE — Evaluation (Signed)
Physical Therapy Evaluation Patient Details Name: [name] MRN: [id number] DOB: [birth year] Today's Date: 11/27/2022  History of Present Illness  Pt is a 52 y/o F admitted on 11/26/22 after presenting following a fall. In the ED pt was initially hypotensive & given 3L. CT scan showed soft tissue lumbar hematoma. PMH: anemia, anxiety, bipolar 1 disorder, COPD, depression, DM, diabetic neuropathy, hiatal hernia, HLD  Clinical Impression  Pt seen for PT evaluation with pt agreeable to tx. Pt reports she lives alone & is independent without AD. On this date, pt endorses significant back pain so provided pt with RW for pain management. Pt is able to complete bed mobility with mod I and STS & gait with RW with supervision fade to mod I. PT educated pt on use of RW for pain management at this time. Will continue to follow pt acutely to address balance, activity tolerance, gait with LRAD & stair negotiation.    Recommendations for follow up therapy are one component of a multi-disciplinary discharge planning process, led by the attending physician.  Recommendations may be updated based on patient status, additional functional criteria and insurance authorization.  Follow Up Recommendations       Assistance Recommended at Discharge PRN  Patient can return home with the following  Assistance with cooking/housework    Equipment Recommendations Rolling walker (2 wheels)  Recommendations for Other Services       Functional Status Assessment Patient has had a recent decline in their functional status and demonstrates the ability to make significant improvements in function in a reasonable and predictable amount of time.     Precautions / Restrictions Precautions Precautions: Fall Restrictions Weight Bearing Restrictions: No      Mobility  Bed Mobility Overal bed mobility: Modified Independent Bed Mobility: Sidelying to Sit   Sidelying to sit: Modified independent (Device/Increase time),  HOB elevated       General bed mobility comments: reliance on bed rails, HOB slightly elevated    Transfers Overall transfer level: Needs assistance   Transfers: Sit to/from Stand Sit to Stand: Supervision, Modified independent (Device/Increase time)           General transfer comment: STS initially from EOB, mod I from toilet with grab bar, both transfers with RW; PT provides educational cuing re: hand placement during STS    Ambulation/Gait Ambulation/Gait assistance: Supervision, Modified independent (Device/Increase time) Gait Distance (Feet): 10 Feet (+ 15 ft + 15 ft) Assistive device: Rolling walker (2 wheels) Gait Pattern/deviations: Decreased step length - right, Decreased stride length, Decreased step length - left Gait velocity: decreased        Stairs            Wheelchair Mobility    Modified Rankin (Stroke Patients Only)       Balance Overall balance assessment: Needs assistance   Sitting balance-Leahy Scale: Good     Standing balance support: During functional activity, Bilateral upper extremity supported Standing balance-Leahy Scale: Good                               Pertinent Vitals/Pain Pain Assessment Pain Assessment: 0-10 Pain Score:  (7-8) Pain Location: back/hematoma Pain Descriptors / Indicators: Discomfort Pain Intervention(s): Patient requesting pain meds-RN notified, Repositioned, Limited activity within patient's tolerance, Monitored during session    Home Living Family/patient expects to be discharged to:: Private residence Living Arrangements: Alone Available Help at Discharge: Family;Available PRN/intermittently Type of Home: House Home  Access: Stairs to enter Entrance Stairs-Rails: Right;Left (wideset) Entrance Stairs-Number of Steps: 2   Home Layout: One level Home Equipment: None      Prior Function Prior Level of Function : Driving;Independent/Modified Independent             Mobility  Comments: On disability       Hand Dominance        Extremity/Trunk Assessment   Upper Extremity Assessment Upper Extremity Assessment: Overall WFL for tasks assessed    Lower Extremity Assessment Lower Extremity Assessment: Overall WFL for tasks assessed       Communication   Communication: No difficulties  Cognition Arousal/Alertness: Awake/alert Behavior During Therapy: WFL for tasks assessed/performed Overall Cognitive Status: Within Functional Limits for tasks assessed                                          General Comments General comments (skin integrity, edema, etc.): Pt c/o dizziness when ambulating around bed to recliner, BP in LUE sitting in recliner 135/76 mmHg MAP 92. Pt with continent void on toilet, performs peri hygiene without assistance.    Exercises     Assessment/Plan    PT Assessment Patient needs continued PT services  PT Problem List Pain;Decreased range of motion;Decreased activity tolerance;Decreased balance;Decreased mobility;Decreased knowledge of precautions;Decreased knowledge of use of DME       PT Treatment Interventions Therapeutic exercise;DME instruction;Gait training;Balance training;Stair training;Neuromuscular re-education;Functional mobility training;Therapeutic activities;Patient/family education    PT Goals (Current goals can be found in the Care Plan section)  Acute Rehab PT Goals Patient Stated Goal: decreased pain PT Goal Formulation: With patient Time For Goal Achievement: 12/11/22 Potential to Achieve Goals: Good    Frequency Min 3X/week     Co-evaluation               AM-PAC PT "6 Clicks" Mobility  Outcome Measure Help needed turning from your back to your side while in a flat bed without using bedrails?: None Help needed moving from lying on your back to sitting on the side of a flat bed without using bedrails?: A Little Help needed moving to and from a bed to a chair (including a  wheelchair)?: A Little Help needed standing up from a chair using your arms (e.g., wheelchair or bedside chair)?: A Little Help needed to walk in hospital room?: A Little Help needed climbing 3-5 steps with a railing? : A Little 6 Click Score: 19    End of Session   Activity Tolerance: Patient limited by pain Patient left: in chair;with call bell/phone within reach   PT Visit Diagnosis: Muscle weakness (generalized) (M62.81);Pain;Difficulty in walking, not elsewhere classified (R26.2) Pain - Right/Left: Left Pain - part of body:  (back)    Time: 1478-2956 PT Time Calculation (min) (ACUTE ONLY): 13 min   Charges:   PT Evaluation $PT Eval Low Complexity: 1 Low          Aleda Grana, PT, DPT 11/27/22, 11:30 AM   Sandi Mariscal 11/27/2022, 11:29 AM

## 2022-11-27 NOTE — TOC Initial Note (Signed)
Transition of Care [facility]) - Initial/Assessment Note    Patient Details  Name: [name] MRN: [id number] Date of Birth: [birth year]  Transition of Care [facility]) CM/SW Contact:    Glennon Mac, RN Phone Number: 11/27/2022, 4:31 PM  Clinical Narrative:                 Pt is a 52 y/o F admitted on 11/26/22 after presenting following a fall. In the ED pt was initially hypotensive & given 3L. CT scan showed soft tissue lumbar hematoma.  PTA, pt independent and living at home alone; her parents live across the street and can provide intermittent assistance as needed.  Her cousin is at bedside.  PT/OT recommending HH follow up, and patient agreeable to services.  Referral to [facility] for continued therapies.  Referral to [facility] for RW and 3 in 1, to be delivered to bedside prior to dc.    Expected Discharge Plan: Home w Home Health Services Barriers to Discharge: Continued Medical Work up   Patient Goals and CMS Choice Patient states their goals for this hospitalization and ongoing recovery are:: to feel better CMS Medicare.gov Compare Post Acute Care list provided to:: Patient   [place] ownership interest in [facility]provided to:: Patient    Expected Discharge Plan and Services   Discharge Planning Services: CM Consult Post Acute Care Choice: Home Health Living arrangements for the past 2 months: Single Family Home                 DME Arranged: Bedside commode, Walker rolling DME Agency: [facility] Date DME Agency Contacted: 11/27/22 Time DME Agency Contacted: 1630 Representative spoke with at DME Agency: Tommye Standard HH Arranged: PT, OT HH Agency: [facility] Date [facility] Agency Contacted: 11/27/22 Time HH Agency Contacted: 1630 Representative spoke with at [facility] Agency: Lorenza Chick  Prior Living Arrangements/Services Living arrangements for the past 2 months: Single Family Home Lives with:: Self Patient language and need for interpreter  reviewed:: Yes Do you feel safe going back to the place where you live?: Yes      Need for Family Participation in Patient Care: Yes (Comment) Care giver support system in place?: Yes (comment)   Criminal Activity/Legal Involvement Pertinent to Current Situation/Hospitalization: No - Comment as needed  Activities of Daily Living Home Assistive Devices/Equipment: None ADL Screening (condition at time of admission) Patient's cognitive ability adequate to safely complete daily activities?: Yes Is the patient deaf or have difficulty hearing?: No Does the patient have difficulty seeing, even when wearing glasses/contacts?: No Does the patient have difficulty concentrating, remembering, or making decisions?: No Patient able to express need for assistance with ADLs?: Yes Does the patient have difficulty dressing or bathing?: Yes Independently performs ADLs?: No Communication: Independent Dressing (OT): Needs assistance Is this a change from baseline?: Change from baseline, expected to last <3days Grooming: Needs assistance Is this a change from baseline?: Change from baseline, expected to last <3 days Feeding: Independent Bathing: Needs assistance Is this a change from baseline?: Change from baseline, expected to last <3 days Toileting: Needs assistance Is this a change from baseline?: Change from baseline, expected to last <3 days In/Out Bed: Needs assistance Is this a change from baseline?: Change from baseline, expected to last <3 days Walks in Home: Independent Does the patient have difficulty walking or climbing stairs?: Yes Weakness of Legs: Both Weakness of Arms/Hands: Both  Permission Sought/Granted  Emotional Assessment Appearance:: Appears stated age Attitude/Demeanor/Rapport: Engaged Affect (typically observed): Accepting Orientation: : Oriented to Self, Oriented to Place, Oriented to  Time, Oriented to Situation      Admission diagnosis:  Fall  [W19.XXXA] Traumatic hematoma of lower back, initial encounter [S30.0XXA] Hypotension, unspecified hypotension type [I95.9] Patient Active Problem List   Diagnosis Date Noted   Fall 11/26/2022   Sepsis (HCC) 03/07/2022   Coffee ground emesis    Multifocal pneumonia 03/06/2022   Hyperkalemia 03/06/2022   SBO (small bowel obstruction) (HCC) 03/06/2022   Metabolic acidosis 03/06/2022   AKI (acute kidney injury) (HCC) 03/06/2022   Iron deficiency anemia due to chronic blood loss 01/29/2022   Severe sepsis (HCC) 08/24/2021   Lobar pneumonia (HCC) 08/24/2021   Acute on chronic respiratory failure with hypoxia (HCC) 08/24/2021   Thoracic degenerative disc disease 12/06/2019   Hypothyroid 05/31/2019   Arteriovenous malformation (AVM)    Symptomatic anemia    Vitamin B12 deficiency 05/25/2019   Melena    History of pancreatitis 05/06/2019   Septic shock (HCC) 05/06/2019   Radicular pain 01/17/2019   Diabetic neuropathy (HCC) 10/29/2017   Elevated white blood cell count, unspecified 01/23/2015   Eosinophilia 01/23/2015   Iron deficiency 01/23/2015   Taking multiple medications for chronic disease 01/23/2015   DM type 2 (diabetes mellitus, type 2) (HCC) 03/28/2014   COPD exacerbation (HCC) 03/27/2014   CAP (community acquired pneumonia) 06/29/2013   Acute bronchitis 06/29/2013   Anxiety 02/27/2013   Abdominal cramping 02/27/2013   BRBPR (bright red blood per rectum) 12/02/2012   Constipation/Stool Impaction 12/02/2012   ABLA (acute blood loss anemia) 12/02/2012   Pancreatitis 11/24/2012   Hyperglycemia, drug-induced 06/08/2012   Arm paresthesia, left 06/07/2012   Neck pain on left side 06/07/2012   Asthma exacerbation 04/05/2012   Acute otitis media 02/04/2012   Hyperlipidemia 02/03/2012   Acute respiratory failure with hypoxia (HCC) 09/23/2011   Tobacco abuse 09/23/2011   Constipation 08/29/2011   Hypokalemia 07/15/2011   Muscle spasm 04/24/2011   Otitis externa 04/24/2011    Idiopathic acute pancreatitis 04/24/2011   Malodorous urine 10/13/2010   Suprapubic pain 10/13/2010   Anxiety and depression 10/12/2010   Back pain, thoracic 10/12/2010   Persistent posttraumatic perforation of ear drum 10/12/2010   Mycoplasma infection in conditions classified elsewhere and of unspecified site 02/19/2010   Dysuria 02/16/2010   Asthma, moderate persistent 08/11/2009   Closed fracture of neck of radius 01/08/2009   ANEMIA, IRON DEFICIENCY 01/04/2009   ANXIETY 01/04/2009   Bipolar disorder (HCC) 01/04/2009   MIGRAINE HEADACHE 01/04/2009   Essential hypertension 01/04/2009   ASTHMA 01/04/2009   ESOPHAGEAL STRICTURE 01/04/2009   GERD 01/04/2009   HIATAL HERNIA 01/04/2009   HEMATOCHEZIA 01/04/2009   Diarrhea 01/04/2009   Generalized abdominal pain 01/04/2009   Hiatal hernia 01/04/2009   PCP:  Elfredia Nevins, MD Pharmacy:   [facility] - [place], [place] - [address] [place] [address] Phone: [phone] Fax: [phone]     Social Determinants of Health (SDOH) Social History: SDOH Screenings   Food Insecurity: No Food Insecurity (10/16/2019)  Housing: Low Risk  (10/16/2019)  Transportation Needs: No Transportation Needs (10/16/2019)  Depression (PHQ2-9): Medium Risk (10/16/2019)  Financial Resource Strain: Low Risk  (02/16/2019)  Tobacco Use: High Risk (11/26/2022)   SDOH Interventions:     Readmission Risk Interventions    03/09/2022    2:36 PM  Readmission Risk Prevention Plan  Transportation Screening  Complete  HRI or Home Care Consult Complete  Social Work Consult for Recovery Care Planning/Counseling Complete  Palliative Care Screening Not Applicable  Medication Review (RN Care Manager) Complete   Quintella Baton, RN, BSN  Trauma/Neuro ICU Case Manager [phone]

## 2022-11-27 NOTE — Progress Notes (Cosign Needed Addendum)
Patient is confined to one room and is unable to ambulate to the bathroom, therefore needing a commode at the bedside.    Moise Friday W. Azariah Latendresse, RN, BSN  Trauma/Neuro ICU Case Manager [phone]  

## 2022-11-27 NOTE — Progress Notes (Signed)
Subjective: CC: Only having back pain and pain on the top of her b/l legs. No n/t/w. Able to ambulate to the bathroom with staff this am. Tolerating diet without n/v or abdominal pain. No cp or sob. No other areas of extremity pain. Voiding. BP this am 97/54 - repeated 119/76. Tachycardia resolved. Hgb stable at 8.5.   Takes 15mg  oxy at baseline QID prn.  Lives at home with her Mom (70) who she says is "in better health" then her and could help at d/c if needed.  Smokes 1PPD No alcohol use No drug use On disability - she says for chronic pancreatitis  Objective: Vital signs in last 24 hours: Temp:  [97.8 F (36.6 C)-98.5 F (36.9 C)] 98.2 F (36.8 C) (05/03 0717) Pulse Rate:  [26-105] 89 (05/03 0812) Resp:  [11-24] 16 (05/03 0717) BP: (76-128)/(49-111) 119/76 (05/03 0812) SpO2:  [91 %-100 %] 94 % (05/03 0717) Weight:  [50.3 kg] 50.3 kg (05/02 1241)    Intake/Output from previous day: No intake/output data recorded. Intake/Output this shift: No intake/output data recorded.  PE: Gen:  Alert, NAD, pleasant Neck: No c-spine ttp or step offs.  Card:  Reg Pulm:  CTAB, no W/R/R, effort normal Abd: Soft, ND, NT, +BS Ext: Hematoma to the posterior mid/lower back, more on the left. No overlying skin erythema. No drainage. No midline ttp. She has ttp over b/l thighs but no bony ttp over the major joints of the BLE's. Able active rom of the BUE and BLE without reported pain except for over the anterior thighs.  Psych: A&Ox3   Lab Results:  Recent Labs    11/26/22 1303 11/26/22 1713 11/26/22 2227 11/27/22 0436  WBC 9.9  --   --  6.4  HGB 11.3*   < > 8.8* 8.5*  HCT 35.2*   < > 27.6* 25.2*  PLT 215  --   --  172   < > = values in this interval not displayed.   BMET Recent Labs    11/26/22 1303 11/26/22 1713 11/27/22 0436  NA 133* 139 139  K 4.3 4.4 3.7  CL 99 102 109  CO2 21*  --  25  GLUCOSE 154* 90 99  BUN 21* 21* 13  CREATININE 1.46* 1.40* 0.96  CALCIUM  8.1*  --  8.0*   PT/INR No results for input(s): "LABPROT", "INR" in the last 72 hours. CMP     Component Value Date/Time   NA 139 11/27/2022 0436   NA 140 10/09/2019 1418   K 3.7 11/27/2022 0436   CL 109 11/27/2022 0436   CO2 25 11/27/2022 0436   GLUCOSE 99 11/27/2022 0436   BUN 13 11/27/2022 0436   BUN 29 (H) 10/09/2019 1418   CREATININE 0.96 11/27/2022 0436   CREATININE 0.60 11/22/2012 1521   CALCIUM 8.0 (L) 11/27/2022 0436   PROT 5.9 (L) 11/26/2022 1303   PROT 7.0 10/09/2019 1418   ALBUMIN 3.4 (L) 11/26/2022 1303   ALBUMIN 4.7 10/09/2019 1418   AST 29 11/26/2022 1303   ALT 27 11/26/2022 1303   ALKPHOS 69 11/26/2022 1303   BILITOT 0.6 11/26/2022 1303   BILITOT 0.2 10/09/2019 1418   GFRNONAA >60 11/27/2022 0436   GFRAA 50 (L) 10/09/2019 1418   Lipase     Component Value Date/Time   LIPASE 21 03/06/2022 1617    Studies/Results: CT L-SPINE NO CHARGE  Result Date: 11/26/2022 CLINICAL DATA:  Trauma EXAM: CT LUMBAR SPINE WITHOUT CONTRAST TECHNIQUE:  Multidetector CT imaging of the lumbar spine was performed without intravenous contrast administration. Multiplanar CT image reconstructions were also generated. RADIATION DOSE REDUCTION: This exam was performed according to the departmental dose-optimization program which includes automated exposure control, adjustment of the mA and/or kV according to patient size and/or use of iterative reconstruction technique. COMPARISON:  CT Chest 08/25/21, CT L Spine 02/16/22 FINDINGS: Segmentation: 5 lumbar type vertebrae. Alignment: Normal. Vertebrae: Redemonstrated is a superior endplate compression deformity at L1, with interval increase in height loss compared to 2023. There is also increased sclerosis of of the superior endplate. Paraspinal and other soft tissues: There is a large soft tissue hematoma in the subcutaneous soft tissues overlying the lower back (series 10, image 72) measuring proximally 8.9 x 3.7 x 10.1 cm. See separately dictated  CT chest abdomen pelvis for additional intra-abdominal findings. Disc levels: No CT evidence of high-grade spinal canal stenosis. No evidence of high-grade neural foraminal stenosis IMPRESSION: 1. Redemonstrated superior endplate compression deformity at L1, with interval increase in height loss compared to 2023. There is also interval increase in sclerosis of the superior endplate. While these findings could represent evolving posttraumatic changes, correlation with ESR and CRP is recommended to exclude the possibility of infection. 2. Large soft tissue hematoma in the subcutaneous soft tissues overlying the lower back, measuring 8.9 x 3.7 x 10.1 cm. Electronically Signed   By: Lorenza Cambridge M.D.   On: 11/26/2022 17:05   CT ABDOMEN PELVIS W CONTRAST  Result Date: 11/26/2022 CLINICAL DATA:  Soft tissue swelling near the left spine. Hypotensive. Evaluate for retroperitoneal hematoma. EXAM: CT ABDOMEN AND PELVIS WITH CONTRAST TECHNIQUE: Multidetector CT imaging of the abdomen and pelvis was performed using the standard protocol following bolus administration of intravenous contrast. RADIATION DOSE REDUCTION: This exam was performed according to the departmental dose-optimization program which includes automated exposure control, adjustment of the mA and/or kV according to patient size and/or use of iterative reconstruction technique. CONTRAST:  80mL OMNIPAQUE IOHEXOL 300 MG/ML  SOLN COMPARISON:  CT abdomen and pelvis 03/06/2022 FINDINGS: Lower chest: There is atelectasis in the lung bases. Hepatobiliary: No focal liver abnormality is seen. Status post cholecystectomy. No biliary dilatation. Pancreas: Unremarkable. No pancreatic ductal dilatation or surrounding inflammatory changes. Spleen: Normal in size without focal abnormality. Adrenals/Urinary Tract: Adrenal glands are unremarkable. Kidneys are normal, without renal calculi, focal lesion, or hydronephrosis. Bladder is unremarkable. Stomach/Bowel: Large hiatal  hernia again seen. The stomach is moderately distended. Appendix not seen. No evidence of bowel wall thickening, distention, or inflammatory changes. There is a large amount of stool throughout the colon. Vascular/Lymphatic: No significant vascular findings are present. No enlarged abdominal or pelvic lymph nodes. Reproductive: Uterus is smaller absent. Adnexa are within normal limits. Other: Subcutaneous heterogeneous hyperdense collection is seen in the posterior lumbar soft tissues measuring 8.5 x 3.8 x 9.4 cm with surrounding edema and swelling most compatible with hematoma. No underlying intramuscular abnormality. No fluid collection or soft tissue gas. Musculoskeletal: No acute fractures. Chronic compression deformities of T12 and L1 are unchanged. IMPRESSION: 1. Subcutaneous hematoma in the posterior lumbar soft tissues measuring up to 9.4 cm. 2. No other acute localizing process in the abdomen or pelvis. 3. Stable large hiatal hernia. 4. Large amount of stool throughout the colon. Electronically Signed   By: Darliss Cheney M.D.   On: 11/26/2022 16:56   CT Head Wo Contrast  Result Date: 11/26/2022 CLINICAL DATA:  Trauma EXAM: CT HEAD WITHOUT CONTRAST TECHNIQUE: Contiguous axial images were obtained  from the base of the skull through the vertex without intravenous contrast. RADIATION DOSE REDUCTION: This exam was performed according to the departmental dose-optimization program which includes automated exposure control, adjustment of the mA and/or kV according to patient size and/or use of iterative reconstruction technique. COMPARISON:  CT Head 08/24/21 FINDINGS: Brain: No evidence of acute infarction, hemorrhage, hydrocephalus, extra-axial collection or mass lesion/mass effect. Vascular: No hyperdense vessel or unexpected calcification. Skull: Normal. Negative for fracture or focal lesion. Sinuses/Orbits: No middle ear or mastoid effusion. Paranasal sinuses are clear. Orbits are unremarkable. Other: None.  IMPRESSION: No acute intracranial abnormality. Electronically Signed   By: Lorenza Cambridge M.D.   On: 11/26/2022 16:55    Anti-infectives: Anti-infectives (From admission, onward)    None        Assessment/Plan Fall at home Subq hematoma of the back - 8.5 x 3.8 x 9.4 cm on CT. Transient hypotension in ED that resolved after IVF. Has not received any PRBC. Hgb stable this am. HDS. Trend labs. Abdominal binder. Therapies.  ABL anemia - 2/2 above. Hgb stable at 8/5 this am. AM labs. Allergy to Iron? AKI - Resolved Chronic Pain - Takes 15mg  oxy at baseline QID prn.  Hx Bipolar 1 disorder - home meds DM2 - Start SSI Hx HLD - home meds Hx COPD - prn inhalers Chronic compression deformities of T12 and L1 - unchanged per rads. No midline ttp.  FEN - Reg diet.  VTE - SCDs, chem ppx on hold for abl anemia ID - None Foley - None Plan - Monitor hgb. Therapies.   I reviewed nursing notes, last 24 h vitals and pain scores, last 48 h intake and output, last 24 h labs and trends, and last 24 h imaging results.   LOS: 0 days    Jacinto Halim , [facility] 11/27/2022, 8:15 AM Please see Amion for pager number during day hours 7:00am-4:30pm

## 2022-11-27 NOTE — Progress Notes (Signed)
Received patient from ED [facility] ),  alert and oriented, in NAD, BP 118/66, HR 95, T98.5, 95% RA. Swelling on left lower back, tender to touch, reported pain on that left lower back radiating to left hip/leg, pain meds adm as ordered. Small abrasion, similar to an open blister on left flank with  petroleum gauze, covered with a mepilex band aid, patient stated not to remove it.  Up and lib with assistance, Instructed to call for assistance, patient verbalized understanding,  safety/fall prec initiated, bed alarm, call light/phone within reach. Poc continue

## 2022-11-27 NOTE — Progress Notes (Signed)
Initial Nutrition Assessment  DOCUMENTATION CODES:   Non-severe (moderate) malnutrition in context of chronic illness  INTERVENTION:  Continue Regular diet as ordered [employer] Essentials TID, each packet mixed with 8 ounces of 2% milk provides 13 grams of protein and 260 calories. Boost Breeze po once daily to try, each supplement provides 250 kcal and 9 grams of protein "High Calorie, High Protein Nutrition Therapy" handout added to AVS Recommend outpatient follow up with a Dietitian given ongoing poor appetite/PO intake  NUTRITION DIAGNOSIS:   Moderate Malnutrition related to chronic illness as evidenced by mild fat depletion, moderate muscle depletion.  GOAL:   Patient will meet greater than or equal to 90% of their needs  MONITOR:   PO intake, Supplement acceptance, Labs, Weight trends  REASON FOR ASSESSMENT:   Malnutrition Screening Tool    ASSESSMENT:   Pt transferred from [facility] after a fall at home and c/o dizziness. PMH significant for chronic pain, hx bipolar disorder 1, T2DM, HLD, COPD, chronic compression deformities of T12 and L1.  Spoke with pt at bedside. She reports that her appetite has chronically been low (>/=10 years) but that it has gotten worse recently. Unfortunately, she was not able to recall what she believes is attributing to her poor PO intake. She ate breakfast this morning, although uncertain what percentage was consumed. She endorses feeling nauseous following working with PT and did not feel up to having lunch.   She states that it's "sometimes ok and sometimes she doesn't eat." Pt recalls eating 1 good meal per day including a protein and vegetable. Otherwise, she drinking gingerale throughout the day. She denies use of protein supplements however reports having tried them but is not fond of them. She is agreeable to trying different supplements to see if these are better tolerated.   Per review of chart, pt noted to have been seen  by RD at the cancer center during infusion for IDA (01/2022). Pt may benefit from outpatient follow up as needed given report of poor PO intake. Encouraged pt to consume nutrition supplements and/or protein containing snacks when missing meals to decrease risk of hypoglycemia and to help maintain optimal nutritional status.   She recalls checking her blood sugar daily which typically runs low however on occasion it may run high but is no higher than 250. She only takes metformin at home to manage her blood diabetes.   Pt states that she used to weigh 165 lbs and is now down to 111 lbs which she reports has been a gradual loss within the last 10 years.  Reviewed weight history on file. Within the last year her weight has gradually declined from 53 kg to 50 kg. No significant weight loss noted.   Medications: colace, SSI 0-15 units TID, protonix, miralax  Labs: CBG's 111, 163 x24 hours, HgbA1c 5.8%   NUTRITION - FOCUSED PHYSICAL EXAM:  Flowsheet Row Most Recent Value  Orbital Region Severe depletion  Upper Arm Region Moderate depletion  Thoracic and Lumbar Region Mild depletion  Buccal Region Mild depletion  Temple Region Mild depletion  Clavicle Bone Region Moderate depletion  Clavicle and Acromion Bone Region Moderate depletion  Scapular Bone Region Mild depletion  Dorsal Hand No depletion  Patellar Region Severe depletion  Anterior Thigh Region Severe depletion  Posterior Calf Region Moderate depletion  Edema (RD Assessment) None  Hair Reviewed  Eyes Reviewed  Mouth Reviewed  Skin Reviewed  Nails Reviewed       Diet Order:  Diet Order             Diet regular Room service appropriate? Yes; Fluid consistency: Thin  Diet effective now                   EDUCATION NEEDS:   Education needs have been addressed  Skin:  Skin Assessment: Reviewed RN Assessment  Last BM:  unknown  Height:   Ht Readings from Last 1 Encounters:  11/26/22 5\' 2"  (1.575 m)     Weight:   Wt Readings from Last 1 Encounters:  11/26/22 50.3 kg   BMI:  Body mass index is 20.3 kg/m.  Estimated Nutritional Needs:   Kcal:  1500-1700  Protein:  75-90g  Fluid:  >/=1.5L  Drusilla Kanner, RDN, LDN Clinical Nutrition

## 2022-11-28 DIAGNOSIS — S300XXA Contusion of lower back and pelvis, initial encounter: Secondary | ICD-10-CM | POA: Diagnosis not present

## 2022-11-28 LAB — CBC
HCT: 25 % — ABNORMAL LOW (ref 36.0–46.0)
Hemoglobin: 8.5 g/dL — ABNORMAL LOW (ref 12.0–15.0)
MCH: 34.8 pg — ABNORMAL HIGH (ref 26.0–34.0)
MCHC: 34 g/dL (ref 30.0–36.0)
MCV: 102.5 fL — ABNORMAL HIGH (ref 80.0–100.0)
Platelets: 150 10*3/uL (ref 150–400)
RBC: 2.44 MIL/uL — ABNORMAL LOW (ref 3.87–5.11)
RDW: 11.9 % (ref 11.5–15.5)
WBC: 6 10*3/uL (ref 4.0–10.5)
nRBC: 0 % (ref 0.0–0.2)

## 2022-11-28 LAB — BASIC METABOLIC PANEL
Anion gap: 4 — ABNORMAL LOW (ref 5–15)
BUN: 12 mg/dL (ref 6–20)
CO2: 27 mmol/L (ref 22–32)
Calcium: 8.4 mg/dL — ABNORMAL LOW (ref 8.9–10.3)
Chloride: 107 mmol/L (ref 98–111)
Creatinine, Ser: 0.95 mg/dL (ref 0.44–1.00)
GFR, Estimated: >60 mL/min (ref >60)
Glucose, Bld: 89 mg/dL (ref 70–99)
Potassium: 3.7 mmol/L (ref 3.5–5.1)
Sodium: 138 mmol/L (ref 135–145)

## 2022-11-28 LAB — GLUCOSE, CAPILLARY
Glucose-Capillary: 84 mg/dL (ref 70–99)
Glucose-Capillary: 180 mg/dL — ABNORMAL HIGH (ref 70–99)
Glucose-Capillary: 124 mg/dL — ABNORMAL HIGH (ref 70–99)

## 2022-11-28 NOTE — Discharge Summary (Signed)
Physician Discharge Summary  Patient ID: [name] MRN: [id number] DOB/AGE: 31-Oct-1970 52 y.o.  Admit date: 11/26/2022 Discharge date: 11/28/2022  Admission Diagnoses:  Discharge Diagnoses:  Principal Problem:   Fall Active Problems:   Malnutrition of moderate degree   Discharged Condition: good  Hospital Course: Pt admitted after fall at home, transferred from [facility] due to hypotension.  She responded well to IVF's.  Pt with large soft tissue hematoma in lumbar region.  Hgb was monitored and stabilized.  Pt worked with PT/OT and a safe discharge plan was reached for assist devices at home.    Consults: None  Significant Diagnostic Studies: labs: cbc, bmet  Treatments: IV hydration and analgesia: acetaminophen and Oxycodone  Discharge Exam: Blood pressure (!) 166/86, pulse 79, temperature 97.8 F (36.6 C), resp. rate 18, height 5\' 2"  (1.575 m), weight 50.3 kg, SpO2 99 %. General appearance: alert and cooperative Hematoma to the posterior mid/lower back, more on the left.  Disposition: Discharge disposition: 01-Home or Self Care        Allergies as of 11/28/2022       Reactions   Penicillins Shortness Of Breath       Sulfa Antibiotics Shortness Of Breath   Aspirin Nausea And Vomiting   Enoxaparin Other (See Comments)   Has been known to cause her blood clots in her legs   Prednisone Other (See Comments)   Pancreatitis, but has to take for asthma sometimes   Venofer [iron Sucrose] Nausea Only, Swelling        Medication List     STOP taking these medications    mupirocin cream 2 % Commonly known as: BACTROBAN       TAKE these medications    albuterol 108 (90 Base) MCG/ACT inhaler Commonly known as: VENTOLIN HFA Inhale 2 puffs into the lungs daily as needed for wheezing or shortness of breath.   albuterol (2.5 MG/3ML) 0.083% nebulizer solution Commonly known as: PROVENTIL Take 2.5 mg by nebulization every 6 (six) hours as needed for wheezing or  shortness of breath.   amitriptyline 100 MG tablet Commonly known as: ELAVIL Take 100 mg by mouth at bedtime. What changed: Another medication with the same name was removed. Continue taking this medication, and follow the directions you see here.   atorvastatin 20 MG tablet Commonly known as: LIPITOR Take 20 mg by mouth daily.   bisacodyl 5 MG EC tablet Commonly known as: DULCOLAX Take 1 tablet (5 mg total) by mouth 2 (two) times daily. What changed: when to take this   budesonide-formoterol 80-4.5 MCG/ACT inhaler Commonly known as: Symbicort Inhale 2 puffs into the lungs 2 (two) times daily. What changed: when to take this   busPIRone 10 MG tablet Commonly known as: BUSPAR Take 10 mg by mouth 2 (two) times daily.   citalopram 20 MG tablet Commonly known as: CELEXA Take 20 mg by mouth daily. What changed: Another medication with the same name was removed. Continue taking this medication, and follow the directions you see here.   DULoxetine 60 MG capsule Commonly known as: CYMBALTA Take 60 mg by mouth daily.   furosemide 20 MG tablet Commonly known as: LASIX Take 1 tablet (20 mg total) by mouth daily.   gabapentin 300 MG capsule Commonly known as: NEURONTIN Take 300 mg by mouth 2 (two) times daily.   ipratropium-albuterol 0.5-2.5 (3) MG/3ML Soln Commonly known as: DUONEB Inhale 3 mLs into the lungs as needed (wheezing/SOB).   levothyroxine 25 MCG tablet Commonly  known as: SYNTHROID Take 25 mcg by mouth daily before breakfast.   lisinopril 2.5 MG tablet Commonly known as: ZESTRIL Take 2.5 mg by mouth daily.   magnesium oxide 400 (240 Mg) MG tablet Commonly known as: MAG-OX Take 1 tablet (400 mg total) by mouth 2 (two) times daily.   metFORMIN 500 MG tablet Commonly known as: GLUCOPHAGE Take 1,000 mg by mouth 2 (two) times daily.   methocarbamol 500 MG tablet Commonly known as: ROBAXIN Take 1,000 mg by mouth in the morning and at bedtime.   ondansetron  4 MG tablet Commonly known as: ZOFRAN Take 4 mg by mouth every 8 (eight) hours as needed for vomiting or nausea.   oxycodone 30 MG immediate release tablet Commonly known as: ROXICODONE Take 30 mg by mouth 4 (four) times daily as needed for pain.   pantoprazole 40 MG tablet Commonly known as: PROTONIX Take 40 mg by mouth 2 (two) times daily.   polyethylene glycol powder 17 GM/SCOOP powder Commonly known as: GLYCOLAX/MIRALAX Take 17 g by mouth 2 (two) times daily. Until daily soft stools  OTC What changed:  when to take this reasons to take this   Spiriva HandiHaler 18 MCG inhalation capsule Generic drug: tiotropium Place 18 mcg into inhaler and inhale as needed (wheezing/SOB).   valACYclovir 500 MG tablet Commonly known as: VALTREX Take 500 mg by mouth daily.   Zenpep 20000-63000 units Cpep Generic drug: Pancrelipase (Lip-Prot-Amyl) Take 2 capsules by mouth in the morning and at bedtime.               Durable Medical Equipment  (From admission, onward)           Start     Ordered   11/27/22 1310  For home use only DME Bedside commode  Once       Question Answer Comment  Patient needs a bedside commode to treat with the following condition Hematoma   Patient needs a bedside commode to treat with the following condition Status post fall      11/27/22 1309   11/27/22 1132  For home use only DME Walker rolling  Once       Question Answer Comment  Walker: With 5 Inch Wheels   Patient needs a walker to treat with the following condition General weakness      11/27/22 1131            Follow-up Information     [facility] Follow up.   Specialty: Home Health Services Why: Home health physical and occupational therapy; agency will call you to schedule appts. Contact information: [address] [place] [address] [phone]         [facility] Follow up.   Why: As needed Contact information: [address] [place] [address] [phone]                Signed: Vanita Panda 11/28/2022, 9:00 AM

## 2022-12-14 ENCOUNTER — Inpatient Hospital Stay: Payer: Medicare Other | Attending: Hematology

## 2023-01-15 ENCOUNTER — Inpatient Hospital Stay: Payer: Medicare Other | Attending: Hematology

## 2023-01-15 ENCOUNTER — Inpatient Hospital Stay: Payer: Medicare Other

## 2023-01-15 VITALS — BP 102/66 | HR 93 | Temp 96.6°F | Resp 16

## 2023-01-15 DIAGNOSIS — Z79899 Other long term (current) drug therapy: Secondary | ICD-10-CM | POA: Diagnosis not present

## 2023-01-15 DIAGNOSIS — E538 Deficiency of other specified B group vitamins: Secondary | ICD-10-CM | POA: Insufficient documentation

## 2023-01-15 DIAGNOSIS — D500 Iron deficiency anemia secondary to blood loss (chronic): Secondary | ICD-10-CM | POA: Insufficient documentation

## 2023-01-15 DIAGNOSIS — D649 Anemia, unspecified: Secondary | ICD-10-CM

## 2023-01-15 LAB — CBC WITH DIFFERENTIAL/PLATELET
Abs Immature Granulocytes: 0.06 10*3/uL (ref 0.00–0.07)
Basophils Absolute: 0.1 10*3/uL (ref 0.0–0.1)
Basophils Relative: 1 %
Eosinophils Absolute: 0.8 10*3/uL — ABNORMAL HIGH (ref 0.0–0.5)
Eosinophils Relative: 7 %
HCT: 42.2 % (ref 36.0–46.0)
Hemoglobin: 13.7 g/dL (ref 12.0–15.0)
Immature Granulocytes: 1 %
Lymphocytes Relative: 22 %
Lymphs Abs: 2.7 10*3/uL (ref 0.7–4.0)
MCH: 33.7 pg (ref 26.0–34.0)
MCHC: 32.5 g/dL (ref 30.0–36.0)
MCV: 103.9 fL — ABNORMAL HIGH (ref 80.0–100.0)
Monocytes Absolute: 0.7 10*3/uL (ref 0.1–1.0)
Monocytes Relative: 6 %
Neutro Abs: 7.9 10*3/uL — ABNORMAL HIGH (ref 1.7–7.7)
Neutrophils Relative %: 63 %
Platelets: 242 10*3/uL (ref 150–400)
RBC: 4.06 MIL/uL (ref 3.87–5.11)
RDW: 12.3 % (ref 11.5–15.5)
WBC: 12.2 10*3/uL — ABNORMAL HIGH (ref 4.0–10.5)
nRBC: 0 % (ref 0.0–0.2)

## 2023-01-15 LAB — IRON AND TIBC
Iron: 59 ug/dL (ref 28–170)
Saturation Ratios: 19 % (ref 10.4–31.8)
TIBC: 311 ug/dL (ref 250–450)
UIBC: 252 ug/dL

## 2023-01-15 LAB — VITAMIN B12: Vitamin B-12: 344 pg/mL (ref 180–914)

## 2023-01-15 LAB — RETICULOCYTES
Immature Retic Fract: 14.8 % (ref 2.3–15.9)
RBC.: 4.08 MIL/uL (ref 3.87–5.11)
Retic Count, Absolute: 60.4 10*3/uL (ref 19.0–186.0)
Retic Ct Pct: 1.5 % (ref 0.4–3.1)

## 2023-01-15 LAB — FERRITIN: Ferritin: 99 ng/mL (ref 11–307)

## 2023-01-15 MED ORDER — CYANOCOBALAMIN 1000 MCG/ML IJ SOLN
1000.0000 ug | Freq: Once | INTRAMUSCULAR | Status: AC
  Administered 2023-01-15: 1000 ug via INTRAMUSCULAR
  Filled 2023-01-15: qty 1

## 2023-01-15 NOTE — Patient Instructions (Signed)
[facility]  Discharge Instructions: Thank you for choosing [facility] to provide your oncology and hematology care.  If you have a lab appointment with the Cancer Center - please note that after April 8th, 2024, all labs will be drawn in the cancer center.  You do not have to check in or register with the main entrance as you have in the past but will complete your check-in in the cancer center.  Wear comfortable clothing and clothing appropriate for easy access to any Portacath or PICC line.   We strive to give you quality time with your provider. You may need to reschedule your appointment if you arrive late (15 or more minutes).  Arriving late affects you and other patients whose appointments are after yours.  Also, if you miss three or more appointments without notifying the office, you may be dismissed from the clinic at the provider's discretion.      For prescription refill requests, have your pharmacy contact our office and allow 72 hours for refills to be completed.    Today you received B12 injection.     BELOW ARE SYMPTOMS THAT SHOULD BE REPORTED IMMEDIATELY: *FEVER GREATER THAN 100.4 F (38 C) OR HIGHER *CHILLS OR SWEATING *NAUSEA AND VOMITING THAT IS NOT CONTROLLED WITH YOUR NAUSEA MEDICATION *UNUSUAL SHORTNESS OF BREATH *UNUSUAL BRUISING OR BLEEDING *URINARY PROBLEMS (pain or burning when urinating, or frequent urination) *BOWEL PROBLEMS (unusual diarrhea, constipation, pain near the anus) TENDERNESS IN MOUTH AND THROAT WITH OR WITHOUT PRESENCE OF ULCERS (sore throat, sores in mouth, or a toothache) UNUSUAL RASH, SWELLING OR PAIN  UNUSUAL VAGINAL DISCHARGE OR ITCHING   Items with * indicate a potential emergency and should be followed up as soon as possible or go to the Emergency Department if any problems should occur.  Please show the CHEMOTHERAPY ALERT CARD or IMMUNOTHERAPY ALERT CARD at check-in to the Emergency Department and triage  nurse.  Should you have questions after your visit or need to cancel or reschedule your appointment, please contact [facility] [phone]  and follow the prompts.  Office hours are 8:00 a.m. to 4:30 p.m. Monday - Friday. Please note that voicemails left after 4:00 p.m. may not be returned until the following business day.  We are closed weekends and major holidays. You have access to a nurse at all times for urgent questions. Please call the main number to the clinic [phone] and follow the prompts.  For any non-urgent questions, you may also contact your provider using MyChart. We now offer e-Visits for anyone 18 and older to request care online for non-urgent symptoms. For details visit [email or web address].   Also download the MyChart app! Go to the app store, search "MyChart", open the app, select [facility], and log in with your MyChart username and password.   

## 2023-01-15 NOTE — Progress Notes (Signed)
[name] presents today for injection per the provider's orders.  B12 administration without incident; injection site WNL; see MAR for injection details.  Patient tolerated procedure well and without incident.  No questions or complaints noted at this time.   Discharged from clinic ambulatory in stable condition. Alert and oriented x 3. F/U with [facility] as scheduled.

## 2023-01-19 LAB — METHYLMALONIC ACID, SERUM: Methylmalonic Acid, Quantitative: 412 nmol/L — ABNORMAL HIGH (ref 0–378)

## 2023-01-22 ENCOUNTER — Inpatient Hospital Stay: Payer: Medicare Other | Admitting: Physician Assistant

## 2023-01-25 ENCOUNTER — Inpatient Hospital Stay: Payer: Medicare Other | Attending: Hematology | Admitting: Physician Assistant

## 2023-01-25 NOTE — Progress Notes (Deleted)
[facility] [address], [place] [address]   CLINIC:  Medical Oncology/Hematology  PCP:  Elfredia Nevins, MD [address] [place] [address] [phone]   REASON FOR VISIT:  Follow-up for normocytic anemia with iron deficiency and B12 deficiency   CURRENT THERAPY: IV iron infusions + B12 injections  INTERVAL HISTORY:   Ms. Mowdy 52 y.o. female returns for routine follow-up of normocytic anemia.  She was last seen by Dr. Ellin Saba on 10/13/2022.  She received Venofer 500 mg x 1 on 11/17/2022.  At today's visit, she reports feeling ***.  She has had increased falls over the past year (hospitalized for vertebral fracture due to fall in July 2023).  Since last visit, she was hospitalized from 11/26/2022 through 11/28/2022 after she fell at home and developed a large soft tissue hematoma in lumbar region.  During hospitalization, Hgb dropped from 11.3 to 8.5, but she did not require any blood transfusions.   *** She denies any recent hematemesis, melena, or hematochezia.  *** *** She is symptomatic with severe fatigue, headaches, palpitations, and lightheadedness. *** She reports baseline dyspnea on exertion from her COPD. *** No pica.  She denies any recent chest pain.     She has ***% energy and ***% appetite. She endorses that she is maintaining a stable weight.   ASSESSMENT & PLAN:  1.  Normocytic anemia - Longstanding history of intermittent iron deficiency anemia since at least 2009, lowest Hgb 6.4. - She has required multiple blood transfusions, most recently given February 2023  - History of GI bleeding from erosive esophagitis and small bowel AVMs. - She follows with GI at [facility] (Dr. Harlen Labs) - Hospitalized from 03/06/2022 through 03/13/2022 due to sepsis/septic shock/pneumonia, as well as acute on chronic anemia with heme positive stools and hematemesis.  EGD/colonoscopy was deferred to the outpatient setting. -  EGD/colonoscopy on 05/25/2022 - esophagus, stomach, and duodenum appeared normal.  Colonoscopy unable to be completed due to poor prep, recommended for repeat colonoscopy in 1 year. - Hospitalized May 2024 for large soft tissue hematoma secondary to mechanical fall.  Hgb trended down to 8.5, but she did not require any blood transfusions.  *** - Hematology work-up (01/29/2022): SPEP/immunofixation normal.  Labs consistent with iron deficiency and vitamin B12 deficiency. - Failed to improve on oral iron supplements, caused constipation and nausea (discontinued) - Intermittent episodes of hematemesis and melena, none since August 2023*** - Received IV Venofer 500 mg x 1 on 11/17/2022. - Symptomatic with fatigue, pica, lightheadedness *** - Most recent labs (01/15/2023): Hgb 13.7/MCV 103.9, ferritin 99, iron saturation 19%. - Differential diagnosis favors iron deficiency anemia secondary to chronic GI blood loss and recent blood loss from soft tissue hematoma - PLAN: Recommend IV iron with Venofer 400 mg x 1 *** - Recommend continued close follow-up with GI. - RTC in 3 months for repeat CBC/iron panel    2.  Vitamin B12 deficiency - Labs from 01/29/2022 showed low vitamin B12 at 132, elevated MMA 1497.  Homocystine elevated at 16.8.  Folate normal. - Patient receiving monthly B12 injections since December 2023 *** - Most recent labs (01/15/2023): Vitamin B12 344, MMA elevated but improved at 412 - PLAN: Continue monthly B12 injections.  Repeat B12/MMA/homocystine in 6 months (January 2025)   3.  Weight loss - Weight has been up and down over the past several years, overall has lost about 50 pounds in the last 5 years.*** - Patient thinks weight loss  may be related to chronic pancreatitis, as she has had multiple episodes where she is unable to eat.*** - CT abdomen/pelvis (03/06/2022) and CTA chest (03/09/2022) negative for any signs of malignancy. - She was seen by dietitian on 02/02/2022 - Weight today  stable at 118 pounds*** - PLAN: We will continue to monitor weight at follow-up visits   4.  Tobacco abuse*** - She has been smoking 1 PPD cigarettes x25 years - Patient referred for LDCT chest, but this was missed due to acute illness and hospitalization - CTA chest (03/09/2022) states no sizable parenchymal nodules noted, but there was significant progressive airspace opacity consistent with pneumonia - LDCT chest (05/11/2022): Lung RADS 2, benign appearance or behavior.  No overtly suspicious nodule or mass.  No focal airspace consolidation. - PLAN: Continue annual LDCT scan - Smoking cessation counseling provided   5.  Other history - PMH: Chronic pancreatitis, chronic pain, hyperlipidemia, hypertension, hypothyroidism, COPD, diabetes mellitus, irritable bowel syndrome, anxiety, bipolar 1 disorder, depression, and sleep apnea - SOCIAL: She is a current smoker, smoking 1 PPD for the last 25 years.  She denies any history of alcohol or illicit drugs.  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 - FAMILY: She denies any family history of blood disorders.  Her maternal grandfather had throat cancer.  Family history otherwise positive for coronary artery disease, hypertension, and diabetes.     PLAN SUMMARY: >> Venofer 300 mg x 3 >> B12 injection TODAY + monthly B12 injections >> Labs in 3 months (CBC/D, ferritin, iron/TIBC, B12, MMA) >> OFFICE visit 1 week after labs  PLAN SUMMARY: >> Venofer 400 mg x 1 >> Continue monthly B12 injections (last given 01/15/2023) >> Labs in 3 months = CBC/D, ferritin, iron/TIBC, CMP >> OFFICE visit in 3 months ***    REVIEW OF SYSTEMS: ***  Review of Systems - Oncology   PHYSICAL EXAM:  ECOG PERFORMANCE STATUS: {CHL ONC ECOG UJ:[id number]} *** There were no vitals filed for this visit. There were no vitals filed for this visit. Physical Exam  PAST MEDICAL/SURGICAL HISTORY:  Past Medical History:  Diagnosis Date   Anemia     Anxiety    Asthma    Bipolar 1 disorder (HCC)    Chronic abdominal pain    COPD (chronic obstructive pulmonary disease) (HCC)    Depression    Diabetes mellitus    Diabetic neuropathy (HCC) 10/29/2017   Esophagitis    Hiatal hernia    Hyperlipidemia 02/03/2012   Iron deficiency anemia due to chronic blood loss 01/29/2022   Migraine    Neck pain 06/08/2012   Pancreatitis chronic Idiopathic   Treated by Dr. Margaretha Glassing at [facility] in the past with celiac blocks.   Past Surgical History:  Procedure Laterality Date   ABDOMINAL HYSTERECTOMY     attempted colonoscopy  02/2017   Dr. Margaretha Glassing: Poor prep, scope passed to mid transverse colon before colonoscopy aborted.  No significant findings noted to mid transverse colon.  Next colonoscopy in 2 years.   CELIAC PLEXUS BLOCK     CHOLECYSTECTOMY     COLONOSCOPY N/A 12/05/2012   FAO:ZHYQMV rectum, colon and terminal ileum   ESOPHAGOGASTRODUODENOSCOPY  02/20/2008   HQI:[id number] distal esophageal mucosa, suspicious for neoplasm/Hiatal hernia otherwise normal    ESOPHAGOGASTRODUODENOSCOPY  04/03/2008   XLK:[id number] hiatal hernia, otherwise normal/Short, tight, benign-appearing peptic stricture   ESOPHAGOGASTRODUODENOSCOPY  November 16, 2012   Dr. Teena Dunk: hiatal hernia, esophagitis,    ESOPHAGOGASTRODUODENOSCOPY (EGD) WITH PROPOFOL N/A  05/09/2019   Dr. Darrick Penna: Large hiatal hernia with erythema and edema in the pouch, mild gastritis.  No biopsies taken.   ESOPHAGOGASTRODUODENOSCOPY (EGD) WITH PROPOFOL N/A 05/26/2019   Procedure: ESOPHAGOGASTRODUODENOSCOPY (EGD) WITH PROPOFOL;  Surgeon: West Bali, MD;  [facility];  Service: Endoscopy;  Laterality: N/A;   EUS  12/2018   Dr. Margaretha Glassing: LA grade B esophagitis, antritis but negative H. pylori, 1 cm ulcer at the duodenal sweep   GIVENS CAPSULE STUDY N/A 12/05/2012   Few superficial erosions but nothing found to explain iron deficiency anemia.    GIVENS CAPSULE STUDY N/A 05/26/2019    Procedure: GIVENS CAPSULE STUDY;  Surgeon: West Bali, MD;  Location: [facility];  Service: Endoscopy;  Laterality: N/A;   Ileocolonoscopy  06/05/2008     OZH:[id number] anal canal, otherwise normal rectum, colon   TONSILLECTOMY      SOCIAL HISTORY:  Social History   Socioeconomic History   Marital status: Divorced    Spouse name: Not on file   Number of children: Not on file   Years of education: Not on file   Highest education level: Some college, no degree  Occupational History   Occupation: disabled  Tobacco Use   Smoking status: Every Day    Packs/day: 1.00    Years: 25.00    Additional pack years: 0.00    Total pack years: 25.00    Types: Cigarettes    Passive exposure: Current   Smokeless tobacco: Never  Vaping Use   Vaping Use: Never used  Substance and Sexual Activity   Alcohol use: No   Drug use: No   Sexual activity: Yes    Birth control/protection: Surgical  Other Topics Concern   Not on file  Social History Narrative   ** Merged History Encounter **       She is on disability for pancreatitis since 2014.  She was previously working as a Electrical engineer in 2004. She lives with her husband in a two level home.    Social Determinants of Health   Financial Resource Strain: Low Risk  (02/16/2019)   Overall Financial Resource Strain (CARDIA)    Difficulty of Paying Living Expenses: Not hard at all  Food Insecurity: No Food Insecurity (10/16/2019)   Hunger Vital Sign    Worried About Running Out of Food in the Last Year: Never true    Ran Out of Food in the Last Year: Never true  Transportation Needs: No Transportation Needs (10/16/2019)   PRAPARE - Administrator, Civil Service (Medical): No    Lack of Transportation (Non-Medical): No  Physical Activity: Not on file  Stress: Not on file  Social Connections: Not on file  Intimate Partner Violence: Not on file    FAMILY HISTORY:  Family History  Problem Relation Age of Onset   Asthma  Other    Asthma Paternal Grandfather    Heart attack Paternal Grandfather    Hypertension Mother    Cancer Maternal Grandmother    Diabetes Paternal Grandmother    Coronary artery disease Neg Hx    Colon cancer Neg Hx     CURRENT MEDICATIONS:  Outpatient Encounter Medications as of 01/25/2023  Medication Sig Note   albuterol (PROVENTIL HFA;VENTOLIN HFA) 108 (90 BASE) MCG/ACT inhaler Inhale 2 puffs into the lungs daily as needed for wheezing or shortness of breath.    albuterol (PROVENTIL) (2.5 MG/3ML) 0.083% nebulizer solution Take 2.5 mg by nebulization every 6 (six) hours  as needed for wheezing or shortness of breath.  11/27/2022: Pt is unsure of last dose.    amitriptyline (ELAVIL) 100 MG tablet Take 100 mg by mouth at bedtime.    atorvastatin (LIPITOR) 20 MG tablet Take 20 mg by mouth daily.    bisacodyl (DULCOLAX) 5 MG EC tablet Take 1 tablet (5 mg total) by mouth 2 (two) times daily. (Patient taking differently: Take 5 mg by mouth daily.) 11/27/2022: Pt is adamant she is taking this medication daily.    budesonide-formoterol (SYMBICORT) 80-4.5 MCG/ACT inhaler Inhale 2 puffs into the lungs 2 (two) times daily. (Patient taking differently: Inhale 2 puffs into the lungs daily.)    busPIRone (BUSPAR) 10 MG tablet Take 10 mg by mouth 2 (two) times daily.    citalopram (CELEXA) 20 MG tablet Take 20 mg by mouth daily.    DULoxetine (CYMBALTA) 60 MG capsule Take 60 mg by mouth daily.    furosemide (LASIX) 20 MG tablet Take 1 tablet (20 mg total) by mouth daily. 11/27/2022: LF 12/23 for 18 DS. Pt is adamant she is still taking this medication daily. Dispense report does not support this claim.    gabapentin (NEURONTIN) 300 MG capsule Take 300 mg by mouth 2 (two) times daily.    ipratropium-albuterol (DUONEB) 0.5-2.5 (3) MG/3ML SOLN Inhale 3 mLs into the lungs as needed (wheezing/SOB). 11/27/2022: Pt is unsure of last dose.    levothyroxine (SYNTHROID, LEVOTHROID) 25 MCG tablet Take 25 mcg by mouth daily  before breakfast.    lisinopril (PRINIVIL,ZESTRIL) 2.5 MG tablet Take 2.5 mg by mouth daily.     magnesium oxide (MAG-OX) 400 (240 Mg) MG tablet Take 1 tablet (400 mg total) by mouth 2 (two) times daily.    metFORMIN (GLUCOPHAGE) 500 MG tablet Take 1,000 mg by mouth 2 (two) times daily.    methocarbamol (ROBAXIN) 500 MG tablet Take 1,000 mg by mouth in the morning and at bedtime.    ondansetron (ZOFRAN) 4 MG tablet Take 4 mg by mouth every 8 (eight) hours as needed for vomiting or nausea.    oxycodone (ROXICODONE) 30 MG immediate release tablet Take 30 mg by mouth 4 (four) times daily as needed for pain.    Pancrelipase, Lip-Prot-Amyl, (ZENPEP) 20000-63000 units CPEP Take 2 capsules by mouth in the morning and at bedtime.    pantoprazole (PROTONIX) 40 MG tablet Take 40 mg by mouth 2 (two) times daily.    polyethylene glycol powder (GLYCOLAX/MIRALAX) powder Take 17 g by mouth 2 (two) times daily. Until daily soft stools  OTC (Patient taking differently: Take 17 g by mouth as needed for mild constipation. Until daily soft stools  OTC) 11/27/2022: Pt is unsure of last dose.    SPIRIVA HANDIHALER 18 MCG inhalation capsule Place 18 mcg into inhaler and inhale as needed (wheezing/SOB). 11/27/2022: Pt is unsure of last dose.    valACYclovir (VALTREX) 500 MG tablet Take 500 mg by mouth daily.    No facility-administered encounter medications on file as of 01/25/2023.    ALLERGIES:  Allergies  Allergen Reactions   Penicillins Shortness Of Breath        Sulfa Antibiotics Shortness Of Breath   Aspirin Nausea And Vomiting   Enoxaparin Other (See Comments)    Has been known to cause her blood clots in her legs   Prednisone Other (See Comments)    Pancreatitis, but has to take for asthma sometimes   Venofer [Iron Sucrose] Nausea Only and Swelling    LABORATORY DATA:  I have reviewed the labs as listed.  CBC    Component Value Date/Time   WBC 12.2 (H) 01/15/2023 0957   RBC 4.08 01/15/2023 0957    RBC 4.06 01/15/2023 0957   HGB 13.7 01/15/2023 0957   HGB 11.3 10/09/2019 1418   HCT 42.2 01/15/2023 0957   HCT 34.6 10/09/2019 1418   PLT 242 01/15/2023 0957   PLT 307 10/09/2019 1418   MCV 103.9 (H) 01/15/2023 0957   MCV 91 10/09/2019 1418   MCH 33.7 01/15/2023 0957   MCHC 32.5 01/15/2023 0957   RDW 12.3 01/15/2023 0957   RDW 13.6 10/09/2019 1418   LYMPHSABS 2.7 01/15/2023 0957   MONOABS 0.7 01/15/2023 0957   EOSABS 0.8 (H) 01/15/2023 0957   BASOSABS 0.1 01/15/2023 0957      Latest Ref Rng & Units 11/28/2022   12:52 AM 11/27/2022    4:36 AM 11/26/2022    5:13 PM  CMP  Glucose 70 - 99 mg/dL 89  99  90   BUN 6 - 20 mg/dL 12  13  21    Creatinine 0.44 - 1.00 mg/dL 1.61  0.96  0.45   Sodium 135 - 145 mmol/L 138  139  139   Potassium 3.5 - 5.1 mmol/L 3.7  3.7  4.4   Chloride 98 - 111 mmol/L 107  109  102   CO2 22 - 32 mmol/L 27  25    Calcium 8.9 - 10.3 mg/dL 8.4  8.0      DIAGNOSTIC IMAGING:  I have independently reviewed the relevant imaging and discussed with the patient.   WRAP UP:  All questions were answered. The patient knows to call the clinic with any problems, questions or concerns.  Medical decision making: ***  Time spent on visit: I spent *** minutes counseling the patient face to face. The total time spent in the appointment was *** minutes and more than 50% was on counseling.  Carnella Guadalajara, PA-C  ***

## 2023-03-22 ENCOUNTER — Ambulatory Visit (HOSPITAL_COMMUNITY)
Admission: RE | Admit: 2023-03-22 | Discharge: 2023-03-22 | Disposition: A | Discharge status: Home / Self Care | Payer: Medicaid - Out of State | Source: Ambulatory Visit | Attending: Internal Medicine | Admitting: Internal Medicine

## 2023-03-22 ENCOUNTER — Other Ambulatory Visit (HOSPITAL_COMMUNITY): Payer: Self-pay | Admitting: Internal Medicine

## 2023-03-22 DIAGNOSIS — M79605 Pain in left leg: Secondary | ICD-10-CM | POA: Insufficient documentation

## 2023-03-27 ENCOUNTER — Inpatient Hospital Stay (HOSPITAL_COMMUNITY)
Admission: EM | Admit: 2023-03-27 | Discharge: 2023-03-30 | DRG: 871 | Disposition: A | Discharge status: Home / Self Care | Payer: Medicare Other | Attending: Family Medicine | Admitting: Family Medicine

## 2023-03-27 ENCOUNTER — Other Ambulatory Visit: Payer: Self-pay

## 2023-03-27 ENCOUNTER — Emergency Department (HOSPITAL_COMMUNITY): Payer: Medicare Other

## 2023-03-27 ENCOUNTER — Encounter (HOSPITAL_COMMUNITY): Payer: Self-pay

## 2023-03-27 DIAGNOSIS — E872 Acidosis, unspecified: Secondary | ICD-10-CM | POA: Diagnosis present

## 2023-03-27 DIAGNOSIS — E876 Hypokalemia: Secondary | ICD-10-CM | POA: Diagnosis present

## 2023-03-27 DIAGNOSIS — R571 Hypovolemic shock: Secondary | ICD-10-CM | POA: Diagnosis present

## 2023-03-27 DIAGNOSIS — Z79891 Long term (current) use of opiate analgesic: Secondary | ICD-10-CM

## 2023-03-27 DIAGNOSIS — D509 Iron deficiency anemia, unspecified: Secondary | ICD-10-CM | POA: Diagnosis present

## 2023-03-27 DIAGNOSIS — T402X5A Adverse effect of other opioids, initial encounter: Secondary | ICD-10-CM | POA: Diagnosis present

## 2023-03-27 DIAGNOSIS — Z882 Allergy status to sulfonamides status: Secondary | ICD-10-CM | POA: Diagnosis not present

## 2023-03-27 DIAGNOSIS — E1140 Type 2 diabetes mellitus with diabetic neuropathy, unspecified: Secondary | ICD-10-CM | POA: Diagnosis present

## 2023-03-27 DIAGNOSIS — K861 Other chronic pancreatitis: Secondary | ICD-10-CM | POA: Diagnosis present

## 2023-03-27 DIAGNOSIS — F17210 Nicotine dependence, cigarettes, uncomplicated: Secondary | ICD-10-CM | POA: Diagnosis present

## 2023-03-27 DIAGNOSIS — R6521 Severe sepsis with septic shock: Secondary | ICD-10-CM | POA: Diagnosis not present

## 2023-03-27 DIAGNOSIS — Z8249 Family history of ischemic heart disease and other diseases of the circulatory system: Secondary | ICD-10-CM | POA: Diagnosis not present

## 2023-03-27 DIAGNOSIS — L03116 Cellulitis of left lower limb: Secondary | ICD-10-CM | POA: Diagnosis present

## 2023-03-27 DIAGNOSIS — Z809 Family history of malignant neoplasm, unspecified: Secondary | ICD-10-CM

## 2023-03-27 DIAGNOSIS — Z79899 Other long term (current) drug therapy: Secondary | ICD-10-CM

## 2023-03-27 DIAGNOSIS — E785 Hyperlipidemia, unspecified: Secondary | ICD-10-CM | POA: Diagnosis present

## 2023-03-27 DIAGNOSIS — A419 Sepsis, unspecified organism: Secondary | ICD-10-CM | POA: Diagnosis not present

## 2023-03-27 DIAGNOSIS — K449 Diaphragmatic hernia without obstruction or gangrene: Secondary | ICD-10-CM | POA: Diagnosis present

## 2023-03-27 DIAGNOSIS — Z7951 Long term (current) use of inhaled steroids: Secondary | ICD-10-CM

## 2023-03-27 DIAGNOSIS — J4489 Other specified chronic obstructive pulmonary disease: Secondary | ICD-10-CM | POA: Diagnosis present

## 2023-03-27 DIAGNOSIS — N179 Acute kidney failure, unspecified: Secondary | ICD-10-CM | POA: Diagnosis not present

## 2023-03-27 DIAGNOSIS — Z825 Family history of asthma and other chronic lower respiratory diseases: Secondary | ICD-10-CM

## 2023-03-27 DIAGNOSIS — K56600 Partial intestinal obstruction, unspecified as to cause: Secondary | ICD-10-CM | POA: Diagnosis present

## 2023-03-27 DIAGNOSIS — J189 Pneumonia, unspecified organism: Secondary | ICD-10-CM

## 2023-03-27 DIAGNOSIS — Z886 Allergy status to analgesic agent status: Secondary | ICD-10-CM

## 2023-03-27 DIAGNOSIS — Z1152 Encounter for screening for COVID-19: Secondary | ICD-10-CM | POA: Diagnosis not present

## 2023-03-27 DIAGNOSIS — Z880 Allergy status to penicillin: Secondary | ICD-10-CM

## 2023-03-27 DIAGNOSIS — F319 Bipolar disorder, unspecified: Secondary | ICD-10-CM | POA: Diagnosis present

## 2023-03-27 DIAGNOSIS — E11649 Type 2 diabetes mellitus with hypoglycemia without coma: Secondary | ICD-10-CM | POA: Diagnosis present

## 2023-03-27 DIAGNOSIS — N170 Acute kidney failure with tubular necrosis: Secondary | ICD-10-CM | POA: Diagnosis present

## 2023-03-27 DIAGNOSIS — Z79890 Hormone replacement therapy (postmenopausal): Secondary | ICD-10-CM

## 2023-03-27 DIAGNOSIS — K5903 Drug induced constipation: Secondary | ICD-10-CM | POA: Diagnosis present

## 2023-03-27 DIAGNOSIS — E119 Type 2 diabetes mellitus without complications: Secondary | ICD-10-CM

## 2023-03-27 DIAGNOSIS — Z7984 Long term (current) use of oral hypoglycemic drugs: Secondary | ICD-10-CM

## 2023-03-27 DIAGNOSIS — Z888 Allergy status to other drugs, medicaments and biological substances status: Secondary | ICD-10-CM

## 2023-03-27 DIAGNOSIS — Z833 Family history of diabetes mellitus: Secondary | ICD-10-CM

## 2023-03-27 LAB — URINALYSIS, ROUTINE W REFLEX MICROSCOPIC
Bilirubin Urine: NEGATIVE
Glucose, UA: NEGATIVE mg/dL
Hgb urine dipstick: NEGATIVE
Ketones, ur: NEGATIVE mg/dL
Nitrite: NEGATIVE
Protein, ur: NEGATIVE mg/dL
Specific Gravity, Urine: 1.012 (ref 1.005–1.030)
pH: 5 (ref 5.0–8.0)

## 2023-03-27 LAB — COMPREHENSIVE METABOLIC PANEL
ALT: 21 U/L (ref 0–44)
AST: 26 U/L (ref 15–41)
Albumin: 2.6 g/dL — ABNORMAL LOW (ref 3.5–5.0)
Alkaline Phosphatase: 101 U/L (ref 38–126)
Anion gap: 19 — ABNORMAL HIGH (ref 5–15)
BUN: 53 mg/dL — ABNORMAL HIGH (ref 6–20)
CO2: 22 mmol/L (ref 22–32)
Calcium: 6.8 mg/dL — ABNORMAL LOW (ref 8.9–10.3)
Chloride: 93 mmol/L — ABNORMAL LOW (ref 98–111)
Creatinine, Ser: 3.66 mg/dL — ABNORMAL HIGH (ref 0.44–1.00)
GFR, Estimated: 14 mL/min — ABNORMAL LOW (ref >60)
Glucose, Bld: 111 mg/dL — ABNORMAL HIGH (ref 70–99)
Potassium: 4.9 mmol/L (ref 3.5–5.1)
Sodium: 134 mmol/L — ABNORMAL LOW (ref 135–145)
Total Bilirubin: 0.8 mg/dL (ref 0.3–1.2)
Total Protein: 5.8 g/dL — ABNORMAL LOW (ref 6.5–8.1)

## 2023-03-27 LAB — CBC WITH DIFFERENTIAL/PLATELET
Abs Immature Granulocytes: 0.05 10*3/uL (ref 0.00–0.07)
Basophils Absolute: 0 10*3/uL (ref 0.0–0.1)
Basophils Relative: 0 %
Eosinophils Absolute: 0 10*3/uL (ref 0.0–0.5)
Eosinophils Relative: 0 %
HCT: 37 % (ref 36.0–46.0)
Hemoglobin: 12.1 g/dL (ref 12.0–15.0)
Immature Granulocytes: 0 %
Lymphocytes Relative: 5 %
Lymphs Abs: 0.9 10*3/uL (ref 0.7–4.0)
MCH: 32.7 pg (ref 26.0–34.0)
MCHC: 32.7 g/dL (ref 30.0–36.0)
MCV: 100 fL (ref 80.0–100.0)
Monocytes Absolute: 0.6 10*3/uL (ref 0.1–1.0)
Monocytes Relative: 4 %
Neutro Abs: 14.6 10*3/uL — ABNORMAL HIGH (ref 1.7–7.7)
Neutrophils Relative %: 91 %
Platelets: 278 10*3/uL (ref 150–400)
RBC: 3.7 MIL/uL — ABNORMAL LOW (ref 3.87–5.11)
RDW: 13.4 % (ref 11.5–15.5)
WBC: 16.2 10*3/uL — ABNORMAL HIGH (ref 4.0–10.5)
nRBC: 0 % (ref 0.0–0.2)

## 2023-03-27 LAB — TROPONIN I (HIGH SENSITIVITY)
Troponin I (High Sensitivity): 3 ng/L (ref <18)
Troponin I (High Sensitivity): 3 ng/L (ref <18)

## 2023-03-27 LAB — LACTIC ACID, PLASMA
Lactic Acid, Venous: 5.1 mmol/L (ref 0.5–1.9)
Lactic Acid, Venous: 5 mmol/L (ref 0.5–1.9)

## 2023-03-27 LAB — LIPASE, BLOOD: Lipase: 18 U/L (ref 11–51)

## 2023-03-27 LAB — PROTIME-INR
INR: 0.9 (ref 0.8–1.2)
Prothrombin Time: 12.6 seconds (ref 11.4–15.2)

## 2023-03-27 LAB — APTT: aPTT: 26 seconds (ref 24–36)

## 2023-03-27 LAB — GLUCOSE, CAPILLARY: Glucose-Capillary: 114 mg/dL — ABNORMAL HIGH (ref 70–99)

## 2023-03-27 LAB — SARS CORONAVIRUS 2 BY RT PCR: SARS Coronavirus 2 by RT PCR: NEGATIVE

## 2023-03-27 MED ORDER — FLUTICASONE FUROATE-VILANTEROL 100-25 MCG/ACT IN AEPB
1.0000 | INHALATION_SPRAY | Freq: Every day | RESPIRATORY_TRACT | Status: DC
  Administered 2023-03-28 – 2023-03-30 (×3): 1 via RESPIRATORY_TRACT
  Filled 2023-03-27: qty 28

## 2023-03-27 MED ORDER — NOREPINEPHRINE 4 MG/250ML-% IV SOLN
0.0000 ug/min | INTRAVENOUS | Status: DC
  Administered 2023-03-27: 2 ug/min via INTRAVENOUS
  Filled 2023-03-27: qty 250

## 2023-03-27 MED ORDER — ORAL CARE MOUTH RINSE: 15.0000 mL | OROMUCOSAL | Status: DC | PRN

## 2023-03-27 MED ORDER — HEPARIN SODIUM (PORCINE) 5000 UNIT/ML IJ SOLN
5000.0000 [IU] | Freq: Three times a day (TID) | INTRAMUSCULAR | Status: DC
  Administered 2023-03-28 – 2023-03-30 (×7): 5000 [IU] via SUBCUTANEOUS
  Filled 2023-03-27 (×6): qty 1

## 2023-03-27 MED ORDER — LACTATED RINGERS IV SOLN: INTRAVENOUS | Status: AC

## 2023-03-27 MED ORDER — SODIUM CHLORIDE 0.9 % IV SOLN: 1.0000 g | Freq: Once | INTRAVENOUS | Status: DC

## 2023-03-27 MED ORDER — METRONIDAZOLE 500 MG/100ML IV SOLN
500.0000 mg | Freq: Once | INTRAVENOUS | Status: AC
  Administered 2023-03-27: 500 mg via INTRAVENOUS
  Filled 2023-03-27: qty 100

## 2023-03-27 MED ORDER — LACTATED RINGERS IV BOLUS
500.0000 mL | Freq: Once | INTRAVENOUS | Status: AC
  Administered 2023-03-27: 500 mL via INTRAVENOUS

## 2023-03-27 MED ORDER — SODIUM CHLORIDE 0.9 % IV SOLN
500.0000 mg | Freq: Once | INTRAVENOUS | Status: AC
  Administered 2023-03-27: 500 mg via INTRAVENOUS
  Filled 2023-03-27: qty 5

## 2023-03-27 MED ORDER — SODIUM CHLORIDE 0.9 % IV SOLN
2.0000 g | Freq: Once | INTRAVENOUS | Status: AC
  Administered 2023-03-27: 2 g via INTRAVENOUS
  Filled 2023-03-27: qty 20

## 2023-03-27 MED ORDER — LACTATED RINGERS IV SOLN: INTRAVENOUS | Status: DC

## 2023-03-27 MED ORDER — CHLORHEXIDINE GLUCONATE CLOTH 2 % EX PADS
6.0000 | MEDICATED_PAD | Freq: Every day | CUTANEOUS | Status: DC
  Administered 2023-03-27 – 2023-03-30 (×4): 6 via TOPICAL

## 2023-03-27 MED ORDER — LEVOFLOXACIN IN D5W 750 MG/150ML IV SOLN: 750.0000 mg | Freq: Once | INTRAVENOUS | Status: DC

## 2023-03-27 MED ORDER — FENTANYL CITRATE PF 50 MCG/ML IJ SOSY
12.5000 ug | PREFILLED_SYRINGE | Freq: Once | INTRAMUSCULAR | Status: AC
  Administered 2023-03-27: 12.5 ug via INTRAVENOUS
  Filled 2023-03-27: qty 1

## 2023-03-27 MED ORDER — ALBUTEROL SULFATE (2.5 MG/3ML) 0.083% IN NEBU: 2.5000 mg | INHALATION_SOLUTION | Freq: Four times a day (QID) | RESPIRATORY_TRACT | Status: DC | PRN

## 2023-03-27 MED ORDER — LACTATED RINGERS IV BOLUS
1000.0000 mL | Freq: Once | INTRAVENOUS | Status: AC
  Administered 2023-03-27: 1000 mL via INTRAVENOUS

## 2023-03-27 NOTE — ED Notes (Signed)
Aware we need urine sample. 

## 2023-03-27 NOTE — ED Provider Notes (Signed)
[facility] AT [facility] Provider Note   CSN: [id number] Arrival date & time: 03/27/23  1421     History {Add pertinent medical, surgical, social history, OB history to HPI:1} Chief Complaint  Patient presents with   Chest Pain    [name] is a 52 y.o. female.   Chest Pain   52 year old female presents emergency department with a few different complaints.  Patient states that she noticed "blisters" pop up on her left foot about a week ago.  States that she had redness as well as swelling of her left lower extremity prompting her to see a provider on Monday he prescribed her doxycycline for treatment of cellulitis.  Patient states that since then, redness as well as swelling and pain have significantly improved but still with blister type areas on her left heel and on the top of her left foot.  Patient states that the doxycycline has been making her feel nauseated so she has not been eating/drinking much since beginning antibiotic on Monday.  States that yesterday, began to feel some pain in her upper middle abdomen that radiated to her back.  Noted progression of pain to her chest that also related to her back.  Family member at bedside states that they have been taking her blood pressure at home and has been low in the 80s or 90s systolic since yesterday.  Also states that she has had worsening cough over the past few days.  Patient states today felt short of breath when experiencing symptoms of chest/abdominal pain.  States that pain has been constant since symptom onset yesterday.  Denies any fever, chills, vomiting, urinary symptoms.  States that her last bowel movement was 2 to 3 weeks ago but states that this is not very abnormal for her.  Past medical history significant for COPD, chronic abdominal pain, diabetes mellitus, chronic pancreatitis, iron deficiency anemia, asthma, anxiety, hyperlipidemia, diabetic neuropathy  Home Medications Prior  to Admission medications   Medication Sig Start Date End Date Taking? Authorizing Provider  albuterol (PROVENTIL HFA;VENTOLIN HFA) 108 (90 BASE) MCG/ACT inhaler Inhale 2 puffs into the lungs daily as needed for wheezing or shortness of breath.   Yes [provider]  albuterol (PROVENTIL) (2.5 MG/3ML) 0.083% nebulizer solution Take 2.5 mg by nebulization every 6 (six) hours as needed for wheezing or shortness of breath.  05/24/19  Yes [provider]  amitriptyline (ELAVIL) 100 MG tablet Take 100 mg by mouth at bedtime.   Yes [provider]  ARIPiprazole (ABILIFY) 10 MG tablet Take 10 mg by mouth daily. 03/22/23  Yes [provider]  atorvastatin (LIPITOR) 20 MG tablet Take 20 mg by mouth daily.   Yes [provider]  budesonide-formoterol (SYMBICORT) 80-4.5 MCG/ACT inhaler Inhale 2 puffs into the lungs 2 (two) times daily. Patient taking differently: Inhale 2 puffs into the lungs daily. 03/30/14  Yes Standley Brooking, MD  busPIRone (BUSPAR) 15 MG tablet Take 15 mg by mouth 2 (two) times daily. 03/22/23  Yes [provider]  doxycycline (VIBRAMYCIN) 100 MG capsule Take 100 mg by mouth 2 (two) times daily. 03/22/23  Yes [provider]  DULoxetine (CYMBALTA) 60 MG capsule Take 60 mg by mouth daily. 06/24/22  Yes [provider]  furosemide (LASIX) 20 MG tablet Take 1 tablet (20 mg total) by mouth daily. 03/13/22  Yes Vassie Loll, MD  gabapentin (NEURONTIN) 300 MG capsule Take 300 mg by mouth 3 (three) times daily.  04/11/19  Yes [provider]  levothyroxine (SYNTHROID, LEVOTHROID) 25 MCG tablet Take 25 mcg by mouth daily before breakfast.   Yes [provider]  lisinopril (PRINIVIL,ZESTRIL) 2.5 MG tablet Take 2.5 mg by mouth daily.  01/18/18  Yes [provider]  metFORMIN (GLUCOPHAGE) 500 MG tablet Take 1,000 mg by mouth 2 (two) times daily.   Yes [provider]  methocarbamol (ROBAXIN) 500 MG  tablet Take 1,000 mg by mouth in the morning and at bedtime.   Yes [provider]  morphine (MS CONTIN) 15 MG 12 hr tablet Take 15 mg by mouth 2 (two) times daily. 03/08/23  Yes [provider]  ondansetron (ZOFRAN) 4 MG tablet Take 4 mg by mouth every 8 (eight) hours as needed for vomiting or nausea. 06/02/19  Yes [provider]  oxycodone (ROXICODONE) 30 MG immediate release tablet Take 30 mg by mouth 5 (five) times daily. 09/21/22  Yes [provider]  Pancrelipase, Lip-Prot-Amyl, (ZENPEP) 20000-63000 units CPEP Take 2 capsules by mouth in the morning and at bedtime. 09/11/19  Yes [provider]  pantoprazole (PROTONIX) 40 MG tablet Take 40 mg by mouth 2 (two) times daily.   Yes [provider]  polyethylene glycol powder (GLYCOLAX/MIRALAX) powder Take 17 g by mouth 2 (two) times daily. Until daily soft stools  OTC Patient taking differently: Take 17 g by mouth as needed for mild constipation. Until daily soft stools  OTC 03/05/15  Yes Barrett Henle, PA-C  valACYclovir (VALTREX) 500 MG tablet Take 500 mg by mouth daily.   Yes [provider]      Allergies    Penicillins, Sulfa antibiotics, Aspirin, Enoxaparin, Prednisone, and Venofer [iron sucrose]    Review of Systems   Review of Systems  Cardiovascular:  Positive for chest pain.  All other systems reviewed and are negative.   Physical Exam Updated Vital Signs BP (!) 92/49   Pulse (!) 104   Temp 98.4 F (36.9 C) (Oral)   Resp 19   Ht 5\' 2"  (1.575 m)   Wt 49.9 kg   SpO2 95%   BMI 20.12 kg/m  Physical Exam Vitals and nursing note reviewed.  Constitutional:      General: She is not in acute distress.    Appearance: She is well-developed.  HENT:     Head: Normocephalic and atraumatic.  Eyes:     Conjunctiva/sclera: Conjunctivae normal.  Cardiovascular:     Rate and Rhythm: Normal rate and regular rhythm.     Pulses: Normal pulses.  Pulmonary:      Effort: Pulmonary effort is normal. No respiratory distress.     Breath sounds: Normal breath sounds.     Comments: *** Abdominal:     Palpations: Abdomen is soft.     Tenderness: There is no abdominal tenderness.  Musculoskeletal:        General: No swelling.     Cervical back: Neck supple.     Comments: Blister type area appreciated on left heel as well as dorsal aspect of left foot.  Clear serous drainage appreciated.  1+ pitting edema noted on left lower extremities.  Skin:    General: Skin is warm and dry.     Capillary Refill: Capillary refill takes less than 2 seconds.  Neurological:     Mental Status: She is alert.  Psychiatric:        Mood and Affect: Mood normal.     ED Results / Procedures / Treatments  Labs (all labs ordered are listed, but only abnormal results are displayed) Labs Reviewed  COMPREHENSIVE METABOLIC PANEL - Abnormal; Notable for the following components:      Result Value   Sodium 134 (*)    Chloride 93 (*)    Glucose, Bld 111 (*)    BUN 53 (*)    Creatinine, Ser 3.66 (*)    Calcium 6.8 (*)    Total Protein 5.8 (*)    Albumin 2.6 (*)    GFR, Estimated 14 (*)    Anion gap 19 (*)    All other components within normal limits  URINALYSIS, ROUTINE W REFLEX MICROSCOPIC - Abnormal; Notable for the following components:   APPearance HAZY (*)    Leukocytes,Ua SMALL (*)    Bacteria, UA RARE (*)    All other components within normal limits  LACTIC ACID, PLASMA - Abnormal; Notable for the following components:   Lactic Acid, Venous 5.1 (*)    All other components within normal limits  LACTIC ACID, PLASMA - Abnormal; Notable for the following components:   Lactic Acid, Venous 5.0 (*)    All other components within normal limits  CBC WITH DIFFERENTIAL/PLATELET - Abnormal; Notable for the following components:   WBC 16.2 (*)    RBC 3.70 (*)    Neutro Abs 14.6 (*)    All other components within normal limits  SARS CORONAVIRUS 2 BY RT PCR  CULTURE,  BLOOD (ROUTINE X 2)  CULTURE, BLOOD (ROUTINE X 2)  LIPASE, BLOOD  PROTIME-INR  APTT  CBC WITH DIFFERENTIAL/PLATELET  TROPONIN I (HIGH SENSITIVITY)  TROPONIN I (HIGH SENSITIVITY)    EKG EKG Interpretation Date/Time:  Saturday March 27 2023 14:30:46 EDT Ventricular Rate:  107 PR Interval:  122 QRS Duration:  104 QT Interval:  352 QTC Calculation: 469 R Axis:   91  Text Interpretation: Sinus tachycardia Incomplete right bundle branch block Possible Right ventricular hypertrophy Abnormal ECG When compared with ECG of 09-Mar-2022 21:31, Nonspecific T wave abnormality no longer evident in Inferior leads No significant change since last tracing Confirmed by Vanetta Mulders [phone]) on 03/27/2023 3:22:12 PM  Radiology CT CHEST ABDOMEN PELVIS WO CONTRAST  Result Date: 03/27/2023 CLINICAL DATA:  Sepsis, chest pain, the pain radiates to the back. Shortness of breath on exertion. Cough for 2 days. Low blood pressure. EXAM: CT CHEST, ABDOMEN AND PELVIS WITHOUT CONTRAST TECHNIQUE: Multidetector CT imaging of the chest, abdomen and pelvis was performed following the standard protocol without IV contrast. RADIATION DOSE REDUCTION: This exam was performed according to the departmental dose-optimization program which includes automated exposure control, adjustment of the mA and/or kV according to patient size and/or use of iterative reconstruction technique. COMPARISON:  Chest radiograph 03/27/2023; CT abdomen and pelvis 11/26/2022 and CT chest 05/11/2022 FINDINGS: CT CHEST FINDINGS Cardiovascular: Normal heart size. No pericardial effusion. Aortic atherosclerotic calcification. Mediastinum/Nodes: Large hiatal hernia containing a portion of the stomach within the chest. Small amount of adherent debris within the upper trachea. No thoracic adenopathy noting limitations of noncontrast exam. Lungs/Pleura: Mosaic attenuation of the lungs compatible with air trapping. Mild diffuse bronchial wall thickening.  Layering debris in the left mainstem bronchus. No focal consolidation, pleural effusion, or pneumothorax. Musculoskeletal: Unchanged superior endplate compression fracture of T12. No acute fracture. CT ABDOMEN PELVIS FINDINGS Hepatobiliary: Cholecystectomy. Unremarkable noncontrast appearance of the liver and biliary tree. Pancreas: No acute abnormality. Spleen: Unremarkable. Adrenals/Urinary Tract: Stable adrenal glands no urinary calculi or hydronephrosis. Unremarkable bladder. Stomach/Bowel: Diffuse dilation of the small bowel  upstream from an area of fecalized ileum in the right lower quadrant. There is smooth tapering to the area of fecalized ileum (circa series 6/image 66-86). The fecalized ileum tapers the decompressed terminal ileum (circa series 6/image 68-90) with possible transition point and series 6/image 90 in the right lower quadrant. Large colonic stool load in the normal caliber colon. Redundant sigmoid colon. Normal appendix. Large hiatal hernia containing a portion of the stomach within the chest. Vascular/Lymphatic: Aortic atherosclerosis. No enlarged abdominal or pelvic lymph nodes. Reproductive: Hysterectomy. Other: No free intraperitoneal fluid or air. Musculoskeletal: Unchanged superior endplate compression fracture of L1. No acute fracture. IMPRESSION: 1. Diffuse dilation of the small bowel upstream from an area of fecalized ileum in the right lower quadrant. There is smooth tapering to the area of fecalized ileum. The fecalized ileum tapers into decompressed terminal ileum with possible transition point in the right lower quadrant. Findings are concerning for partial small-bowel obstruction. 2. Large colonic stool burden compatible with constipation. 3. Large hiatal hernia containing a portion of the stomach within the chest. 4. Pulmonary findings compatible with airway inflammation/infection. Query chronic aspiration given large hiatal hernia containing a portion of the stomach. Aortic  Atherosclerosis (ICD10-I70.0). Electronically Signed   By: Minerva Fester M.D.   On: 03/27/2023 18:22   DG Foot Complete Left  Result Date: 03/27/2023 CLINICAL DATA:  Oozing from foot.  Sepsis. EXAM: LEFT FOOT - COMPLETE 3+ VIEW COMPARISON:  01/26/2020, without report FINDINGS: Forefoot soft tissue swelling dorsally. No soft tissue gas or radiopaque foreign object. No osseous destruction or soft tissue ulcer identified. Nonacute fractures of the second through fourth metatarsal shafts. The second fracture is incompletely healed with minimal comminution. IMPRESSION: Soft tissue swelling without other explanation for "oozing". Nonacute second through fourth metatarsal fractures as detailed above. Electronically Signed   By: Jeronimo Greaves M.D.   On: 03/27/2023 17:45   DG Chest Port 1 View  Result Date: 03/27/2023 CLINICAL DATA:  Chest pain.  Shortness of breath. EXAM: PORTABLE CHEST 1 VIEW COMPARISON:  03/11/2022 FINDINGS: The lungs are clear without focal pneumonia, edema, pneumothorax or pleural effusion. Interstitial markings are diffusely coarsened with chronic features. Streaky opacity at the bases, left greater than right is compatible with atelectasis or scarring. The cardiopericardial silhouette is within normal limits for size. Large hiatal hernia. IMPRESSION: 1. Chronic interstitial coarsening with bibasilar atelectasis or scarring. 2. Large hiatal hernia. Electronically Signed   By: Kennith Center M.D.   On: 03/27/2023 17:45    Procedures .Critical Care  Performed by: Peter Garter, PA Authorized by: Peter Garter, PA   Critical care provider statement:    Critical care time (minutes):  63   Critical care was necessary to treat or prevent imminent or life-threatening deterioration of the following conditions:  Renal failure, sepsis and shock   Critical care was time spent personally by me on the following activities:  Development of treatment plan with patient or surrogate,  discussions with consultants, evaluation of patient's response to treatment, examination of patient, ordering and review of laboratory studies, ordering and review of radiographic studies, ordering and performing treatments and interventions, pulse oximetry, re-evaluation of patient's condition and review of old charts   I assumed direction of critical care for this patient from another provider in my specialty: no     Care discussed with: admitting provider     {Document cardiac monitor, telemetry assessment procedure when appropriate:1}  Medications Ordered in ED Medications  lactated ringers infusion ( Intravenous  New Bag/Given 03/27/23 1751)  azithromycin (ZITHROMAX) 500 mg in sodium chloride 0.9 % 250 mL IVPB (500 mg Intravenous New Bag/Given 03/27/23 1917)  metroNIDAZOLE (FLAGYL) IVPB 500 mg (500 mg Intravenous New Bag/Given 03/27/23 1916)  norepinephrine (LEVOPHED) 4mg  in (0.016 mg/mL) premix infusion (has no administration in time range)  lactated ringers bolus 1,000 mL (0 mLs Intravenous Stopped 03/27/23 1752)  lactated ringers bolus 500 mL (0 mLs Intravenous Stopped 03/27/23 1752)  cefTRIAXone (ROCEPHIN) 2 g in sodium chloride 0.9 % 100 mL IVPB (0 g Intravenous Stopped 03/27/23 1932)    ED Course/ Medical Decision Making/ A&P Clinical Course as of 03/27/23 1935  Sat Mar 27, 2023  1836 Consulted Dr. Lovell Sheehan of General surgery who will see patient once admitted. [CR]  1905 Consulted Dr. Tonia Brooms of critical care who recommended starting pressors and admission to [facility] ICU. [CR]    Clinical Course User Index [CR] Peter Garter, PA   {   Click here for ABCD2, HEART and other calculatorsREFRESH Note before signing :1}                              Medical Decision Making Amount and/or Complexity of Data Reviewed Labs: ordered. Radiology: ordered.  Risk Prescription drug management.   This patient presents to the ED for concern of chest pain, abdominal pain, this involves an  extensive number of treatment options, and is a complaint that carries with it a high risk of complications and morbidity.  The differential diagnosis includes gastritis, PUD, SBO/LBO, volvulus, CBD pathology, diverticulitis, appendicitis, peritonitis, aortic dissection, aortic aneurysm, ACS, PE, pneumothorax, pneumonia   Co morbidities that complicate the patient evaluation  See HPI   Additional history obtained:  Additional history obtained from EMR External records from outside source obtained and reviewed including hospital records   Lab Tests:  I Ordered, and personally interpreted labs.  The pertinent results include: Leukocytosis of 16.2 with left shift.  No evidence of anemia.  Placed within normal range.  COVID-negative.  Lactic acid elevated to 5.  PT/INR and APTT within normal limits.  Troponin of 3.  Lipase within normal limits.  Blood cultures pending.  Patient with evidence of significant AKI with creatinine 3.66, BUN of 53, GFR 14 with anion gap of 19.  No transaminitis.   Imaging Studies ordered:  I ordered imaging studies including x-ray left foot, CT chest abdomen pelvis, chest x-ray I independently visualized and interpreted imaging which showed  Left foot x-ray: Soft tissue swelling.  Nonacute 2nd through 4th metatarsal fractures. Chest x-ray: Chronic interstitial coarsening.  Large hiatal hernia. CT chest abdomen pelvis: Diffuse dilation of small bowel upstream from fecalized ileum.  Large colonic stool burden.  Large hiatal hernia.  Pulmonary findings consistent with airway inflammation/infection.  Aortic atherosclerosis. I agree with the radiologist interpretation  Cardiac Monitoring: / EKG:  The patient was maintained on a cardiac monitor.  I personally viewed and interpreted the cardiac monitored which showed an underlying rhythm of: Sinus tachycardia with incomplete right bundle branch block, evidence of RVH.  No significant abnormality from prior EKG  performed.   Consultations Obtained:  See ED course  Problem List / ED Course / Critical interventions / Medication management  Septic shock with endorgan dysfunction.  Pneumonia, AKI, partial SBO I ordered medication including 1500 cc of lactated Ringer's, Rocephin, azithromycin, Flagyl, Levophed   Reevaluation of the patient after these medicines showed that the patient improved I  have reviewed the patients home medicines and have made adjustments as needed   Social Determinants of Health:  Chronic cigarette use.  Denies illicit drug use.   Test / Admission - Considered:  Septic shock with endorgan dysfunction.  Pneumonia, AKI, partial SBO Vitals signs significant for hypertension with blood pressure of 83/50 systolic initially with some improvement up to 99/48 with MAP hovering in the 60s.  Patient with persistent tachycardia with heart rate in the low 100s.. Otherwise within normal range and stable throughout visit. Laboratory/imaging studies significant for: See above *** Treatment plan were discussed at length with patient and they knowledge understanding was agreeable to said plan.  Appropriate consultations were made as described in the ED course.  Patient was stable upon admission to the hospital.   {Document critical care time when appropriate:1} {Document review of labs and clinical decision tools ie heart score, Chads2Vasc2 etc:1}  {Document your independent review of radiology images, and any outside records:1} {Document your discussion with family members, caretakers, and with consultants:1} {Document social determinants of health affecting pt's care:1} {Document your decision making why or why not admission, treatments were needed:1} Final Clinical Impression(s) / ED Diagnoses Final diagnoses:  Sepsis with acute renal failure and septic shock, due to unspecified organism, unspecified acute renal failure type (HCC)  Pneumonia due to infectious organism, unspecified  laterality, unspecified part of lung  Partial small bowel obstruction (HCC)  AKI (acute kidney injury) (HCC)    Rx / DC Orders ED Discharge Orders     None

## 2023-03-27 NOTE — H&P (Signed)
NAME:  [name], MRN:  [id number], DOB:  11-20-1970, LOS: 0 ADMISSION DATE:  03/27/2023, CONSULTATION DATE:  8/31 REFERRING MD:  Dr. Deretha Emory, CHIEF COMPLAINT:  Hypotension   History of Present Illness:  52 year old female with past medical history as below, which is significant for chronic pancreatitis, diabetes, COPD, iron deficient anemia and bipolar disorder.  She presented to [facility] emergency department on 8/31 with complaints of erythema and blisters left lower extremity as well as nausea and poor oral intake.  Also complained of abdominal pain with radiation to her back.  Symptoms progressed to include shortness of breath at the height of the abdominal/chest pain.  Reported last bowel movement 2 to 3 weeks prior to presentation, however, this is not totally abnormal.  Patient was hypotensive and initiated on IVF resuscitation.  Laboratory evaluation significant for lactic acid of 5 and WBC 16.2.  Imaging included x-ray of the left foot showing soft tissue swelling, and CT chest abdomen pelvis significant for large stool burden with partial bowel obstruction.  Despite 30 cc/kg fluid resuscitation the patient remained for line hypotensive.  Lactic acid did not significantly improve.  PCCM was asked to admit to [facility], ICU.  Pertinent  Medical History   has a past medical history of Anemia, Anxiety, Asthma, Bipolar 1 disorder (HCC), Chronic abdominal pain, COPD (chronic obstructive pulmonary disease) (HCC), Depression, Diabetes mellitus, Diabetic neuropathy (HCC) (10/29/2017), Esophagitis, Hiatal hernia, Hyperlipidemia (02/03/2012), Iron deficiency anemia due to chronic blood loss (01/29/2022), Migraine, Neck pain (06/08/2012), and Pancreatitis chronic (Idiopathic).   Significant Hospital Events: Including procedures, antibiotic start and stop dates in addition to other pertinent events   8/31 admit for shock, SBO  Interim History / Subjective:    Objective   Blood pressure (!)  92/49, pulse (!) 104, temperature 98.4 F (36.9 C), temperature source Oral, resp. rate 19, height 5\' 2"  (1.575 m), weight 49.9 kg, SpO2 95%.       No intake or output data in the 24 hours ending 03/27/23 1926 Filed Weights   03/27/23 1428  Weight: 49.9 kg    Examination: General: Adult female appears older than stated age.  HENT: [place]/AT, PERRL, no JVD. MM dry and cracked.  Lungs: Clear bilateral breath sounds Cardiovascular: Tachy, regular, no MRG Abdomen: Soft, very mild tenderness over LLQ, non-distended. BS active.  Extremities: LLE edema with furuncle on the dorsal surface of the L foot and a ruptured blister on the the medial aspect of the heel.  Neuro: Alert, oriented, non-focal GU: on bladder scan  Resolved Hospital Problem list     Assessment & Plan:   Shock: hypotension with LA 5 on presentation. Hypovolemic vs septic, although septic source unclear. Poor PO intake since starting doxycycline due to nausea. Also was prescribed lasix for lower extremity edema 8/26. She does have LLE cellulitis started on doxy 8/26, which has improved quite a bit by her report. Urine and CT chest unremarkable.  - Admit to ICU - NE for MAP goal 65 mmHg ( now with MAP 80) - Needs more volume will order an additional 2 L.   Partial small bowel obstruction: large stool burden likely secondary to opioid induced constipation. Takes 30 mg morphine and 150 mg oxycodone daily. - Consider surgery consult in AM or sooner if clinical change. - NPO - Aggressive IVF hydration - Hiatal hernia, no gastric distention noted on CT. Defer NGT decompression for now.  - She has had a bowel movement already tonight. Will start  with a suppository. Considering a dose of relistor.  - If no relief with suppository, or worsening of symptoms, will proceed SBO order set with NGT decompression and gastrografin administration.  - Holding her enteral opioids while NPO, can use IV if needed for  pain/withdrawal  Acute renal failure: obstruction vs prerenal azotemia. - Refusing foley - on bladder scan, will straight cath and repeat bladder scans overnight.  - Hydration as above - Hold lasix - Trend chemistry  Cellulitis of left lower extremity: on outpatient doxycyline since 8/26 - Escalate antibiotics to cefepime/vanco. (PCN allergy, tolerated cefepime last admit) - May need to drain furuncle on left foot. Defer to surgery/day team.   COPD without exacerbation - resume home symbicort (sub-breo) and albuterol  DM - holding metformin - CBG monitoring and SSI  Chronic pancreatitis - holding Zenpep while NPO  Iron deficiency anemia - trend H&H  Bipolar MDD - Holding enteral medications while NPO  Best Practice (right click and "Reselect all SmartList Selections" daily)   Diet/type: NPO DVT prophylaxis: prophylactic heparin  GI prophylaxis: PPI Lines: N/A Foley:  N/A Code Status:  full code Last date of multidisciplinary goals of care discussion [ ]   Labs   CBC: Recent Labs  Lab 03/27/23 1628  WBC 16.2*  NEUTROABS 14.6*  HGB 12.1  HCT 37.0  MCV 100.0  PLT 278    Basic Metabolic Panel: Recent Labs  Lab 03/27/23 1548  NA 134*  K 4.9  CL 93*  CO2 22  GLUCOSE 111*  BUN 53*  CREATININE 3.66*  CALCIUM 6.8*   GFR: Estimated Creatinine Clearance: 13.8 mL/min (A) (by C-G formula based on SCr of 3.66 mg/dL (H)). Recent Labs  Lab 03/27/23 1548 03/27/23 1628 03/27/23 1717  WBC  --  16.2*  --   LATICACIDVEN 5.1*  --  5.0*    Liver Function Tests: Recent Labs  Lab 03/27/23 1548  AST 26  ALT 21  ALKPHOS 101  BILITOT 0.8  PROT 5.8*  ALBUMIN 2.6*   Recent Labs  Lab 03/27/23 1548  LIPASE 18   No results for input(s): "AMMONIA" in the last 168 hours.  ABG    Component Value Date/Time   PHART 7.32 (L) 03/09/2022 2110   PCO2ART 37 03/09/2022 2110   PO2ART 69 (L) 03/09/2022 2110   HCO3 19.1 (L) 03/09/2022 2110   TCO2 26  11/26/2022 1713   ACIDBASEDEF 6.4 (H) 03/09/2022 2110   O2SAT 93.8 03/09/2022 2110     Coagulation Profile: Recent Labs  Lab 03/27/23 1740  INR 0.9    Cardiac Enzymes: No results for input(s): "CKTOTAL", "CKMB", "CKMBINDEX", "TROPONINI" in the last 168 hours.  HbA1C: Hgb A1c MFr Bld  Date/Time Value Ref Range Status  11/27/2022 11:01 AM 5.8 (H) 4.8 - 5.6 % Final    Comment:    (NOTE) Pre diabetes:          5.7%-6.4%  Diabetes:              >6.4%  Glycemic control for   <7.0% adults with diabetes   03/07/2022 04:35 AM 5.9 (H) 4.8 - 5.6 % Final    Comment:    (NOTE) Pre diabetes:          5.7%-6.4%  Diabetes:              >6.4%  Glycemic control for   <7.0% adults with diabetes     CBG: No results for input(s): "GLUCAP" in the last 168 hours.  Review  of Systems:   Bolds are positive  Constitutional: weight loss, gain, night sweats, Fevers, chills, fatigue .  HEENT: headaches, Sore throat, sneezing, nasal congestion, post nasal drip, Difficulty swallowing, Tooth/dental problems, visual complaints visual changes, ear ache CV:  chest pain, radiates:,Orthopnea, PND, swelling in lower extremities, dizziness, palpitations, syncope.  GI  heartburn, indigestion, abdominal pain improved after BM, nausea resolved, vomiting, diarrhea, change in bowel habits, loss of appetite, bloody stools.  Resp: cough, productive: , hemoptysis, dyspnea improved, chest pain, pleuritic.  Skin: rash or itching or icterus GU: dysuria, change in color of urine, urgency or frequency. flank pain, hematuria  MS: joint pain or swelling. decreased range of motion  Psych: change in mood or affect. depression or anxiety.  Neuro: difficulty with speech, weakness, numbness, ataxia    Past Medical History:  She,  has a past medical history of Anemia, Anxiety, Asthma, Bipolar 1 disorder (HCC), Chronic abdominal pain, COPD (chronic obstructive pulmonary disease) (HCC), Depression, Diabetes mellitus,  Diabetic neuropathy (HCC) (10/29/2017), Esophagitis, Hiatal hernia, Hyperlipidemia (02/03/2012), Iron deficiency anemia due to chronic blood loss (01/29/2022), Migraine, Neck pain (06/08/2012), and Pancreatitis chronic (Idiopathic).   Surgical History:   Past Surgical History:  Procedure Laterality Date   ABDOMINAL HYSTERECTOMY     attempted colonoscopy  02/2017   Dr. Margaretha Glassing: Poor prep, scope passed to mid transverse colon before colonoscopy aborted.  No significant findings noted to mid transverse colon.  Next colonoscopy in 2 years.   CELIAC PLEXUS BLOCK     CHOLECYSTECTOMY     COLONOSCOPY N/A 12/05/2012   ZOX:WRUEAV rectum, colon and terminal ileum   ESOPHAGOGASTRODUODENOSCOPY  02/20/2008   WUJ:[id number] distal esophageal mucosa, suspicious for neoplasm/Hiatal hernia otherwise normal    ESOPHAGOGASTRODUODENOSCOPY  04/03/2008   NFA:[id number] hiatal hernia, otherwise normal/Short, tight, benign-appearing peptic stricture   ESOPHAGOGASTRODUODENOSCOPY  November 16, 2012   Dr. Teena Dunk: hiatal hernia, esophagitis,    ESOPHAGOGASTRODUODENOSCOPY (EGD) WITH PROPOFOL N/A 05/09/2019   Dr. Darrick Penna: Large hiatal hernia with erythema and edema in the pouch, mild gastritis.  No biopsies taken.   ESOPHAGOGASTRODUODENOSCOPY (EGD) WITH PROPOFOL N/A 05/26/2019   Procedure: ESOPHAGOGASTRODUODENOSCOPY (EGD) WITH PROPOFOL;  Surgeon: West Bali, MD;  Location: [facility];  Service: Endoscopy;  Laterality: N/A;   EUS  12/2018   Dr. Margaretha Glassing: LA grade B esophagitis, antritis but negative H. pylori, 1 cm ulcer at the duodenal sweep   GIVENS CAPSULE STUDY N/A 12/05/2012   Few superficial erosions but nothing found to explain iron deficiency anemia.    GIVENS CAPSULE STUDY N/A 05/26/2019   Procedure: GIVENS CAPSULE STUDY;  Surgeon: West Bali, MD;  Location: [facility];  Service: Endoscopy;  Laterality: N/A;   Ileocolonoscopy  06/05/2008     EXB:MWUXLKG anal canal, otherwise normal rectum, colon    TONSILLECTOMY       Social History:   reports that she has been smoking cigarettes. She has a 25 pack-year smoking history. She has been exposed to tobacco smoke. She has never used smokeless tobacco. She reports that she does not drink alcohol and does not use drugs.   Family History:  Her family history includes Asthma in her paternal grandfather and another family member; Cancer in her maternal grandmother; Diabetes in her paternal grandmother; Heart attack in her paternal grandfather; Hypertension in her mother. There is no history of Coronary artery disease or Colon cancer.   Allergies Allergies  Allergen Reactions   Penicillins Shortness Of Breath        Sulfa  Antibiotics Shortness Of Breath   Aspirin Nausea And Vomiting   Enoxaparin Other (See Comments)    Has been known to cause her blood clots in her legs   Prednisone Other (See Comments)    Pancreatitis, but has to take for asthma sometimes   Venofer [Iron Sucrose] Nausea Only and Swelling     Home Medications  Prior to Admission medications   Medication Sig Start Date End Date Taking? Authorizing Provider  albuterol (PROVENTIL HFA;VENTOLIN HFA) 108 (90 BASE) MCG/ACT inhaler Inhale 2 puffs into the lungs daily as needed for wheezing or shortness of breath.   Yes [provider]  albuterol (PROVENTIL) (2.5 MG/3ML) 0.083% nebulizer solution Take 2.5 mg by nebulization every 6 (six) hours as needed for wheezing or shortness of breath.  05/24/19  Yes [provider]  amitriptyline (ELAVIL) 100 MG tablet Take 100 mg by mouth at bedtime.   Yes [provider]  ARIPiprazole (ABILIFY) 10 MG tablet Take 10 mg by mouth daily. 03/22/23  Yes [provider]  atorvastatin (LIPITOR) 20 MG tablet Take 20 mg by mouth daily.   Yes [provider]  budesonide-formoterol (SYMBICORT) 80-4.5 MCG/ACT inhaler Inhale 2 puffs into the lungs 2 (two) times daily. Patient taking differently: Inhale 2 puffs  into the lungs daily. 03/30/14  Yes Standley Brooking, MD  busPIRone (BUSPAR) 15 MG tablet Take 15 mg by mouth 2 (two) times daily. 03/22/23  Yes [provider]  doxycycline (VIBRAMYCIN) 100 MG capsule Take 100 mg by mouth 2 (two) times daily. 03/22/23  Yes [provider]  DULoxetine (CYMBALTA) 60 MG capsule Take 60 mg by mouth daily. 06/24/22  Yes [provider]  furosemide (LASIX) 20 MG tablet Take 1 tablet (20 mg total) by mouth daily. 03/13/22  Yes Vassie Loll, MD  gabapentin (NEURONTIN) 300 MG capsule Take 300 mg by mouth 3 (three) times daily. 04/11/19  Yes [provider]  levothyroxine (SYNTHROID, LEVOTHROID) 25 MCG tablet Take 25 mcg by mouth daily before breakfast.   Yes [provider]  lisinopril (PRINIVIL,ZESTRIL) 2.5 MG tablet Take 2.5 mg by mouth daily.  01/18/18  Yes [provider]  metFORMIN (GLUCOPHAGE) 500 MG tablet Take 1,000 mg by mouth 2 (two) times daily.   Yes [provider]  methocarbamol (ROBAXIN) 500 MG tablet Take 1,000 mg by mouth in the morning and at bedtime.   Yes [provider]  morphine (MS CONTIN) 15 MG 12 hr tablet Take 15 mg by mouth 2 (two) times daily. 03/08/23  Yes [provider]  ondansetron (ZOFRAN) 4 MG tablet Take 4 mg by mouth every 8 (eight) hours as needed for vomiting or nausea. 06/02/19  Yes [provider]  oxycodone (ROXICODONE) 30 MG immediate release tablet Take 30 mg by mouth 5 (five) times daily. 09/21/22  Yes [provider]  Pancrelipase, Lip-Prot-Amyl, (ZENPEP) 20000-63000 units CPEP Take 2 capsules by mouth in the morning and at bedtime. 09/11/19  Yes [provider]  pantoprazole (PROTONIX) 40 MG tablet Take 40 mg by mouth 2 (two) times daily.   Yes [provider]  polyethylene glycol powder (GLYCOLAX/MIRALAX) powder Take 17 g by mouth 2 (two) times daily. Until daily soft stools  OTC Patient taking differently: Take 17 g  by mouth as needed for mild constipation. Until daily soft stools  OTC 03/05/15  Yes Barrett Henle, PA-C  valACYclovir (VALTREX) 500 MG tablet Take 500 mg by mouth daily.   Yes [provider]     Critical care time: 51 minutes       Joneen Roach, AGACNP-BC [facility]  See Amion for personal pager PCCM on call pager [phone] until 7pm. Please call Elink 7p-7a. [phone]  03/27/2023 7:51 PM

## 2023-03-27 NOTE — Progress Notes (Incomplete)
eLink Physician-Brief Progress Note Patient Name: [name] DOB: [birth year] MRN: [id number]   Date of Service  03/27/2023  HPI/Events of Note  52/F, with chornic pancreatitis, presenting to the ED with blisters to the LLE as well as nausea and poor PO intake. On evalution in the ED, she was reported to be hypotensive, and workup was significant for leukocytosis (15), and lactic acidosis (5). CT of the abdomen+pelvis shows large stool burden with partial SBO. She was also noted to have L foot soft tissue swelling.  She was started on empiric antibiotics, eventually started on pressors, and admitted to the ICU for continued management of septic shock.   MAP 81, HR 117, SpO2 96%, RR 11 On norepinephrine @6mcg /min Awake, alert not in distress  eICU Interventions  Septic shock -?etiology - GI vs. UTI vs. LE cellulitis -Started on empiric antiboics. Will follow cultures and deescalate as warranted -Given initial 79ml/kg bolus in the ED.  -Continue IVF. Monitor I/Os -Titrate levophed to maintain MAP >65 -Trend WBC,lactate, temperature cuvrve  AKI - Probably from hypotension and decreased renal perfusion. Will maintain MAP >65 - No calculus/hydronephrosis seen on CTAP - Follow up UA - Monitor I/Os, daily weights - Renally dose medications - Avoid nephrotoxins.          [name] M DELA CRUZ 03/27/2023, 11:28 PM  3:39 AM Mg 1.0, Cr 2.45 Placed order for magnesium 2g IV.  Will continue to monitor serial labs.

## 2023-03-27 NOTE — ED Triage Notes (Signed)
Chest pain that started yesterday, radiates to back. 7/10. Burning pain SOB on exertion  Pt stated "every time I stand I feel like I am going to fall out"  Cough x2 days BO 83/50 in triage

## 2023-03-27 NOTE — ED Notes (Signed)
Pt refused foley catheter. Pt was educated on the importance of the catheter.

## 2023-03-28 DIAGNOSIS — A419 Sepsis, unspecified organism: Secondary | ICD-10-CM | POA: Diagnosis not present

## 2023-03-28 DIAGNOSIS — R6521 Severe sepsis with septic shock: Secondary | ICD-10-CM | POA: Diagnosis not present

## 2023-03-28 DIAGNOSIS — N179 Acute kidney failure, unspecified: Secondary | ICD-10-CM | POA: Diagnosis not present

## 2023-03-28 LAB — HEMOGLOBIN A1C
Hgb A1c MFr Bld: 6.7 % — ABNORMAL HIGH (ref 4.8–5.6)
Mean Plasma Glucose: 145.59 mg/dL

## 2023-03-28 LAB — BASIC METABOLIC PANEL
Anion gap: 15 (ref 5–15)
BUN: 48 mg/dL — ABNORMAL HIGH (ref 6–20)
CO2: 24 mmol/L (ref 22–32)
Calcium: 6.7 mg/dL — ABNORMAL LOW (ref 8.9–10.3)
Chloride: 99 mmol/L (ref 98–111)
Creatinine, Ser: 2.45 mg/dL — ABNORMAL HIGH (ref 0.44–1.00)
GFR, Estimated: 23 mL/min — ABNORMAL LOW (ref >60)
Glucose, Bld: 91 mg/dL (ref 70–99)
Potassium: 4.3 mmol/L (ref 3.5–5.1)
Sodium: 138 mmol/L (ref 135–145)

## 2023-03-28 LAB — PHOSPHORUS: Phosphorus: 4.4 mg/dL (ref 2.5–4.6)

## 2023-03-28 LAB — GLUCOSE, CAPILLARY
Glucose-Capillary: 89 mg/dL (ref 70–99)
Glucose-Capillary: 87 mg/dL (ref 70–99)
Glucose-Capillary: 74 mg/dL (ref 70–99)
Glucose-Capillary: 84 mg/dL (ref 70–99)
Glucose-Capillary: 120 mg/dL — ABNORMAL HIGH (ref 70–99)
Glucose-Capillary: 83 mg/dL (ref 70–99)

## 2023-03-28 LAB — CBC
HCT: 35 % — ABNORMAL LOW (ref 36.0–46.0)
Hemoglobin: 11.4 g/dL — ABNORMAL LOW (ref 12.0–15.0)
MCH: 32 pg (ref 26.0–34.0)
MCHC: 32.6 g/dL (ref 30.0–36.0)
MCV: 98.3 fL (ref 80.0–100.0)
Platelets: 241 10*3/uL (ref 150–400)
RBC: 3.56 MIL/uL — ABNORMAL LOW (ref 3.87–5.11)
RDW: 13.4 % (ref 11.5–15.5)
WBC: 14.2 10*3/uL — ABNORMAL HIGH (ref 4.0–10.5)
nRBC: 0 % (ref 0.0–0.2)

## 2023-03-28 LAB — LACTIC ACID, PLASMA: Lactic Acid, Venous: 1.1 mmol/L (ref 0.5–1.9)

## 2023-03-28 LAB — MRSA NEXT GEN BY PCR, NASAL: MRSA by PCR Next Gen: NOT DETECTED

## 2023-03-28 LAB — MAGNESIUM: Magnesium: 1 mg/dL — ABNORMAL LOW (ref 1.7–2.4)

## 2023-03-28 LAB — LIPASE, BLOOD: Lipase: 17 U/L (ref 11–51)

## 2023-03-28 MED ORDER — SENNOSIDES-DOCUSATE SODIUM 8.6-50 MG PO TABS
1.0000 | ORAL_TABLET | Freq: Two times a day (BID) | ORAL | Status: DC
  Administered 2023-03-28 – 2023-03-30 (×4): 1 via ORAL
  Filled 2023-03-28 (×5): qty 1

## 2023-03-28 MED ORDER — MAGNESIUM SULFATE 2 GM/50ML IV SOLN
2.0000 g | Freq: Once | INTRAVENOUS | Status: AC
  Administered 2023-03-28: 2 g via INTRAVENOUS
  Filled 2023-03-28: qty 50

## 2023-03-28 MED ORDER — DULOXETINE HCL 60 MG PO CPEP
60.0000 mg | ORAL_CAPSULE | Freq: Every day | ORAL | Status: DC
  Administered 2023-03-28 – 2023-03-30 (×3): 60 mg via ORAL
  Filled 2023-03-28: qty 1
  Filled 2023-03-28: qty 2
  Filled 2023-03-28: qty 1

## 2023-03-28 MED ORDER — BUSPIRONE HCL 5 MG PO TABS
15.0000 mg | ORAL_TABLET | Freq: Two times a day (BID) | ORAL | Status: DC
  Administered 2023-03-28 – 2023-03-30 (×5): 15 mg via ORAL
  Filled 2023-03-28 (×2): qty 3
  Filled 2023-03-28: qty 1
  Filled 2023-03-28 (×2): qty 3

## 2023-03-28 MED ORDER — FENTANYL CITRATE PF 50 MCG/ML IJ SOSY
12.5000 ug | PREFILLED_SYRINGE | INTRAMUSCULAR | Status: DC | PRN
  Administered 2023-03-28: 25 ug via INTRAVENOUS
  Administered 2023-03-28: 12.5 ug via INTRAVENOUS
  Administered 2023-03-28: 25 ug via INTRAVENOUS
  Administered 2023-03-28: 12.5 ug via INTRAVENOUS
  Administered 2023-03-29 (×2): 25 ug via INTRAVENOUS
  Administered 2023-03-29: 12.5 ug via INTRAVENOUS
  Administered 2023-03-29 – 2023-03-30 (×3): 25 ug via INTRAVENOUS
  Filled 2023-03-28 (×10): qty 1

## 2023-03-28 MED ORDER — FENTANYL CITRATE PF 50 MCG/ML IJ SOSY
12.5000 ug | PREFILLED_SYRINGE | INTRAMUSCULAR | Status: DC | PRN
  Administered 2023-03-28: 12.5 ug via INTRAVENOUS
  Filled 2023-03-28: qty 1

## 2023-03-28 MED ORDER — BISACODYL 10 MG RE SUPP
10.0000 mg | Freq: Every day | RECTAL | Status: DC | PRN
  Filled 2023-03-28: qty 1

## 2023-03-28 MED ORDER — NOREPINEPHRINE 4 MG/250ML-% IV SOLN
2.0000 ug/min | INTRAVENOUS | Status: DC
  Administered 2023-03-28: 3 ug/min via INTRAVENOUS

## 2023-03-28 MED ORDER — SODIUM CHLORIDE 0.9 % IV SOLN
2.0000 g | Freq: Once | INTRAVENOUS | Status: AC
  Administered 2023-03-28: 2 g via INTRAVENOUS
  Filled 2023-03-28: qty 12.5

## 2023-03-28 MED ORDER — VANCOMYCIN VARIABLE DOSE PER UNSTABLE RENAL FUNCTION (PHARMACIST DOSING): Status: DC

## 2023-03-28 MED ORDER — ATORVASTATIN CALCIUM 10 MG PO TABS
20.0000 mg | ORAL_TABLET | Freq: Every day | ORAL | Status: DC
  Administered 2023-03-28 – 2023-03-30 (×3): 20 mg via ORAL
  Filled 2023-03-28 (×3): qty 2

## 2023-03-28 MED ORDER — ARIPIPRAZOLE 10 MG PO TABS
10.0000 mg | ORAL_TABLET | Freq: Every day | ORAL | Status: DC
  Administered 2023-03-28 – 2023-03-30 (×3): 10 mg via ORAL
  Filled 2023-03-28 (×3): qty 1

## 2023-03-28 MED ORDER — LEVOTHYROXINE SODIUM 25 MCG PO TABS
25.0000 ug | ORAL_TABLET | Freq: Every day | ORAL | Status: DC
  Administered 2023-03-28 – 2023-03-30 (×3): 25 ug via ORAL
  Filled 2023-03-28 (×3): qty 1

## 2023-03-28 MED ORDER — CALCIUM GLUCONATE-NACL 2-0.675 GM/100ML-% IV SOLN
2.0000 g | Freq: Once | INTRAVENOUS | Status: AC
  Administered 2023-03-28: 2000 mg via INTRAVENOUS
  Filled 2023-03-28: qty 100

## 2023-03-28 MED ORDER — VANCOMYCIN HCL IN DEXTROSE 1-5 GM/200ML-% IV SOLN
1000.0000 mg | Freq: Once | INTRAVENOUS | Status: AC
  Administered 2023-03-28: 1000 mg via INTRAVENOUS
  Filled 2023-03-28: qty 200

## 2023-03-28 MED ORDER — POLYETHYLENE GLYCOL 3350 17 G PO PACK
17.0000 g | PACK | Freq: Two times a day (BID) | ORAL | Status: DC
  Administered 2023-03-28 – 2023-03-30 (×5): 17 g via ORAL
  Filled 2023-03-28 (×5): qty 1

## 2023-03-28 MED ORDER — SODIUM CHLORIDE 0.9 % IV SOLN
2.0000 g | INTRAVENOUS | Status: DC
  Administered 2023-03-28: 2 g via INTRAVENOUS
  Filled 2023-03-28: qty 12.5

## 2023-03-28 MED ORDER — INSULIN ASPART 100 UNIT/ML IJ SOLN
0.0000 [IU] | INTRAMUSCULAR | Status: DC
  Administered 2023-03-29: 1 [IU] via SUBCUTANEOUS
  Administered 2023-03-29: 3 [IU] via SUBCUTANEOUS

## 2023-03-28 MED ORDER — AMITRIPTYLINE HCL 50 MG PO TABS
100.0000 mg | ORAL_TABLET | Freq: Every day | ORAL | Status: DC
  Administered 2023-03-28 – 2023-03-29 (×2): 100 mg via ORAL
  Filled 2023-03-28 (×3): qty 2

## 2023-03-28 MED ORDER — LACTULOSE 10 GM/15ML PO SOLN: 20.0000 g | ORAL | Status: DC

## 2023-03-28 MED ORDER — SODIUM CHLORIDE 0.9 % IV SOLN
250.0000 mL | INTRAVENOUS | Status: DC
  Administered 2023-03-28: 250 mL via INTRAVENOUS

## 2023-03-28 MED ORDER — MIDODRINE HCL 5 MG PO TABS
10.0000 mg | ORAL_TABLET | Freq: Three times a day (TID) | ORAL | Status: DC
  Administered 2023-03-28 – 2023-03-30 (×6): 10 mg via ORAL
  Filled 2023-03-28 (×6): qty 2

## 2023-03-28 NOTE — Progress Notes (Signed)
Pharmacy Antibiotic Note  [name] is a 52 y.o. female admitted on 03/27/2023 with sepsis with unclear etiology, possibly GI vs UTI vs cellulitis.  Pharmacy has been consulted for vancomycin and cefepime dosing.  Of note SCr 3.66 w/ baseline ~1.  Plan: Vancomycin 1000mg  IV x1; monitor SCr +/- levels prior to redosing. Cefepime 2g IV Q24H.  Height: 5\' 2"  (157.5 cm) Weight: 49.9 kg (110 lb) IBW/kg (Calculated) : 50.1  Temp (24hrs), Avg:98.3 F (36.8 C), Min:98.2 F (36.8 C), Max:98.4 F (36.9 C)  Recent Labs  Lab 03/27/23 1548 03/27/23 1628 03/27/23 1717  WBC  --  16.2*  --   CREATININE 3.66*  --   --   LATICACIDVEN 5.1*  --  5.0*    Estimated Creatinine Clearance: 13.8 mL/min (A) (by C-G formula based on SCr of 3.66 mg/dL (H)).    Allergies  Allergen Reactions   Penicillins Shortness Of Breath        Sulfa Antibiotics Shortness Of Breath   Aspirin Nausea And Vomiting   Enoxaparin Other (See Comments)    Has been known to cause her blood clots in her legs   Prednisone Other (See Comments)    Pancreatitis, but has to take for asthma sometimes   Venofer [Iron Sucrose] Nausea Only and Swelling    Thank you for allowing pharmacy to be a part of this patient's care.  Vernard Gambles, PharmD, BCPS  03/28/2023 12:39 AM

## 2023-03-28 NOTE — Progress Notes (Signed)
NAME:  [name], MRN:  [id number], DOB:  [birth year], LOS: 1 ADMISSION DATE:  03/27/2023, CONSULTATION DATE:  8/31 REFERRING MD:  Dr. Deretha Emory, CHIEF COMPLAINT:  Hypotension   History of Present Illness:  52 year old female with past medical history as below, which is significant for chronic pancreatitis, diabetes, COPD, iron deficient anemia and bipolar disorder.  She presented to [facility] emergency department on 8/31 with complaints of erythema and blisters left lower extremity as well as nausea and poor oral intake.  Also complained of abdominal pain with radiation to her back.  Symptoms progressed to include shortness of breath at the height of the abdominal/chest pain.  Reported last bowel movement 2 to 3 weeks prior to presentation, however, this is not totally abnormal.  Patient was hypotensive and initiated on IVF resuscitation.  Laboratory evaluation significant for lactic acid of 5 and WBC 16.2.  Imaging included x-ray of the left foot showing soft tissue swelling, and CT chest abdomen pelvis significant for large stool burden with partial bowel obstruction.  Despite 30 cc/kg fluid resuscitation the patient remained for line hypotensive.  Lactic acid did not significantly improve.  PCCM was asked to admit to [facility], ICU.  Pertinent  Medical History   has a past medical history of Anemia, Anxiety, Asthma, Bipolar 1 disorder (HCC), Chronic abdominal pain, COPD (chronic obstructive pulmonary disease) (HCC), Depression, Diabetes mellitus, Diabetic neuropathy (HCC) (10/29/2017), Esophagitis, Hiatal hernia, Hyperlipidemia (02/03/2012), Iron deficiency anemia due to chronic blood loss (01/29/2022), Migraine, Neck pain (06/08/2012), and Pancreatitis chronic (Idiopathic).   Significant Hospital Events: Including procedures, antibiotic start and stop dates in addition to other pertinent events   8/31 admit for shock, SBO  Interim History / Subjective:  Still requiring low-dose vasopressor  support Serum creatinine is improving Remain afebrile Continue to complain of left foot heel pain  Objective   Blood pressure 107/66, pulse 95, temperature 98.3 F (36.8 C), temperature source Oral, resp. rate 10, height 5\' 2"  (1.575 m), weight 50 kg, SpO2 98%.        Intake/Output Summary (Last 24 hours) at 03/28/2023 0908 Last data filed at 03/28/2023 4098 Gross per 24 hour  Intake 5728.36 ml  Output 1085 ml  Net 4643.36 ml   Filed Weights   03/27/23 1428 03/28/23 0500  Weight: 49.9 kg 50 kg    Examination: General: Laying female, lying on the bed HEENT: [place]/AT, eyes anicteric.  moist mucus membranes Neuro: Alert, awake following commands Chest: Coarse breath sounds, no wheezes or rhonchi Heart: Regular rate and rhythm, no murmurs or gallops Abdomen: Soft, nontender, nondistended, bowel sounds present Skin: Wound noted on left heel with small amount of pus, there is erythema of the foot noted on dorsum of foot, POA   Labs and images were reviewed  Resolved Hospital Problem list     Assessment & Plan:  Combined septic/hypovolemic shock, likely due to infected left heel wound and surrounding cellulitis, POA Patient remain on vasopressor support with Levophed at low-dose Continue IV fluid therapy White count is trending down Continue IV antibiotics with vancomycin and cefepime She may need MRI of the foot if she does not get better with antibiotics Wound care is consulted Titrate vasopressors with MAP goal 65  Partial small bowel obstruction: Due to severe constipation Large stool burden likely secondary to opioid induced constipation. Takes 30 mg morphine and 150 mg oxycodone daily She had 2 bowel movements overnight Hold opiate meds Continue aggressive bowel regimen Start clear liquid diet, advance as  tolerated  Acute renal failure: Due to septic ATN Hypocalcemia/hypomagnesemia Serum creatinine improved from 3.6 down to 2.4 Continue IV fluid Monitor intake and  output Avoid nephrotoxic agent  COPD without exacerbation Continue symbicort (sub-breo) and albuterol  DM type II, controlled Hemoglobin A1c 6.7 Continue sliding scale insulin CBG goal 140-180  Chronic pancreatitis Hold Zenpep  Iron deficiency anemia Monitor H&H and transfuse if less than 7  Bipolar MDD Restart antipsychotic meds  Best Practice (right click and "Reselect all SmartList Selections" daily)   Diet/type: Clear liquid diet, advance as tolerated  DVT prophylaxis: prophylactic heparin  GI prophylaxis: N/A Lines: N/A Foley:  N/A Code Status:  full code Last date of multidisciplinary goals of care discussion [9/1: Patient was updated at bedside, decision was to continue full scope of care]  Labs   CBC: Recent Labs  Lab 03/27/23 1628 03/28/23 0240  WBC 16.2* 14.2*  NEUTROABS 14.6*  --   HGB 12.1 11.4*  HCT 37.0 35.0*  MCV 100.0 98.3  PLT 278 241    Basic Metabolic Panel: Recent Labs  Lab 03/27/23 1548 03/28/23 0240  NA 134* 138  K 4.9 4.3  CL 93* 99  CO2 22 24  GLUCOSE 111* 91  BUN 53* 48*  CREATININE 3.66* 2.45*  CALCIUM 6.8* 6.7*  MG  --  1.0*  PHOS  --  4.4   GFR: Estimated Creatinine Clearance: 20.7 mL/min (A) (by C-G formula based on SCr of 2.45 mg/dL (H)). Recent Labs  Lab 03/27/23 1548 03/27/23 1628 03/27/23 1717 03/28/23 0240  WBC  --  16.2*  --  14.2*  LATICACIDVEN 5.1*  --  5.0*  --     Liver Function Tests: Recent Labs  Lab 03/27/23 1548  AST 26  ALT 21  ALKPHOS 101  BILITOT 0.8  PROT 5.8*  ALBUMIN 2.6*   Recent Labs  Lab 03/27/23 1548 03/28/23 0240  LIPASE 18 17   No results for input(s): "AMMONIA" in the last 168 hours.  ABG    Component Value Date/Time   PHART 7.32 (L) 03/09/2022 2110   PCO2ART 37 03/09/2022 2110   PO2ART 69 (L) 03/09/2022 2110   HCO3 19.1 (L) 03/09/2022 2110   TCO2 26 11/26/2022 1713   ACIDBASEDEF 6.4 (H) 03/09/2022 2110   O2SAT 93.8 03/09/2022 2110     Coagulation  Profile: Recent Labs  Lab 03/27/23 1740  INR 0.9    Cardiac Enzymes: No results for input(s): "CKTOTAL", "CKMB", "CKMBINDEX", "TROPONINI" in the last 168 hours.  HbA1C: Hgb A1c MFr Bld  Date/Time Value Ref Range Status  03/28/2023 02:40 AM 6.7 (H) 4.8 - 5.6 % Final    Comment:    (NOTE) Pre diabetes:          5.7%-6.4%  Diabetes:              >6.4%  Glycemic control for   <7.0% adults with diabetes   11/27/2022 11:01 AM 5.8 (H) 4.8 - 5.6 % Final    Comment:    (NOTE) Pre diabetes:          5.7%-6.4%  Diabetes:              >6.4%  Glycemic control for   <7.0% adults with diabetes     CBG: Recent Labs  Lab 03/27/23 2246 03/28/23 0310 03/28/23 0723  GLUCAP 114* 89 87    The patient is critically ill due to septic shock due to infected left foot wound and cellulitis/acute kidney injury.  Critical  care was necessary to treat or prevent imminent or life-threatening deterioration.  Critical care was time spent personally by me on the following activities: development of treatment plan with patient and/or surrogate as well as nursing, discussions with consultants, evaluation of patient's response to treatment, examination of patient, obtaining history from patient or surrogate, ordering and performing treatments and interventions, ordering and review of laboratory studies, ordering and review of radiographic studies, pulse oximetry, re-evaluation of patient's condition and participation in multidisciplinary rounds.   During this encounter critical care time was devoted to patient care services described in this note for 35 minutes.     Cheri Fowler, MD [facility] See Amion for pager If no response to pager, please call [phone] until 7pm After 7pm, Please call E-link [phone]

## 2023-03-28 NOTE — Consult Note (Signed)
WOC consulted for left foot wounds, reviewing Images. Will provided topical care   Brondon Wann [facility] MSN, RN,CWOCN, CNS, CWON-AP [phone])

## 2023-03-28 NOTE — Plan of Care (Signed)
  Problem: Education: Goal: Knowledge of General Education information will improve Description: Including pain rating scale, medication(s)/side effects and non-pharmacologic comfort measures Outcome: Progressing   Problem: Health Behavior/Discharge Planning: Goal: Ability to manage health-related needs will improve Outcome: Progressing   Problem: Clinical Measurements: Goal: Ability to maintain clinical measurements within normal limits will improve Outcome: Progressing Goal: Diagnostic test results will improve Outcome: Progressing Goal: Respiratory complications will improve Outcome: Progressing Goal: Cardiovascular complication will be avoided Outcome: Progressing   Problem: Activity: Goal: Risk for activity intolerance will decrease Outcome: Progressing   Problem: Coping: Goal: Level of anxiety will decrease Outcome: Progressing   Problem: Elimination: Goal: Will not experience complications related to bowel motility Outcome: Progressing Goal: Will not experience complications related to urinary retention Outcome: Progressing   Problem: Pain Managment: Goal: General experience of comfort will improve Outcome: Progressing   Problem: Safety: Goal: Ability to remain free from injury will improve Outcome: Progressing   Problem: Skin Integrity: Goal: Risk for impaired skin integrity will decrease Outcome: Progressing   

## 2023-03-28 NOTE — Plan of Care (Signed)

## 2023-03-29 DIAGNOSIS — R6521 Severe sepsis with septic shock: Secondary | ICD-10-CM | POA: Diagnosis not present

## 2023-03-29 DIAGNOSIS — A419 Sepsis, unspecified organism: Secondary | ICD-10-CM | POA: Diagnosis not present

## 2023-03-29 DIAGNOSIS — N179 Acute kidney failure, unspecified: Secondary | ICD-10-CM | POA: Diagnosis not present

## 2023-03-29 LAB — GLUCOSE, CAPILLARY
Glucose-Capillary: 121 mg/dL — ABNORMAL HIGH (ref 70–99)
Glucose-Capillary: 129 mg/dL — ABNORMAL HIGH (ref 70–99)
Glucose-Capillary: 210 mg/dL — ABNORMAL HIGH (ref 70–99)
Glucose-Capillary: 67 mg/dL — ABNORMAL LOW (ref 70–99)
Glucose-Capillary: 95 mg/dL (ref 70–99)
Glucose-Capillary: 96 mg/dL (ref 70–99)

## 2023-03-29 LAB — RENAL FUNCTION PANEL
Albumin: 1.6 g/dL — ABNORMAL LOW (ref 3.5–5.0)
Anion gap: 11 (ref 5–15)
BUN: 29 mg/dL — ABNORMAL HIGH (ref 6–20)
CO2: 22 mmol/L (ref 22–32)
Calcium: 7.4 mg/dL — ABNORMAL LOW (ref 8.9–10.3)
Chloride: 104 mmol/L (ref 98–111)
Creatinine, Ser: 1.15 mg/dL — ABNORMAL HIGH (ref 0.44–1.00)
GFR, Estimated: 57 mL/min — ABNORMAL LOW (ref >60)
Glucose, Bld: 71 mg/dL (ref 70–99)
Phosphorus: 2 mg/dL — ABNORMAL LOW (ref 2.5–4.6)
Potassium: 3.6 mmol/L (ref 3.5–5.1)
Sodium: 137 mmol/L (ref 135–145)

## 2023-03-29 LAB — VANCOMYCIN, RANDOM: Vancomycin Rm: 7 ug/mL

## 2023-03-29 LAB — MAGNESIUM: Magnesium: 1.8 mg/dL (ref 1.7–2.4)

## 2023-03-29 MED ORDER — SODIUM CHLORIDE 0.9 % IV SOLN
2.0000 g | Freq: Two times a day (BID) | INTRAVENOUS | Status: DC
  Administered 2023-03-29 (×2): 2 g via INTRAVENOUS
  Filled 2023-03-29 (×2): qty 12.5

## 2023-03-29 MED ORDER — K PHOS MONO-SOD PHOS DI & MONO 155-852-130 MG PO TABS
250.0000 mg | ORAL_TABLET | Freq: Every day | ORAL | Status: DC
  Administered 2023-03-29 – 2023-03-30 (×2): 250 mg via ORAL
  Filled 2023-03-29 (×2): qty 1

## 2023-03-29 MED ORDER — VANCOMYCIN HCL 750 MG/150ML IV SOLN
750.0000 mg | INTRAVENOUS | Status: DC
  Administered 2023-03-29: 750 mg via INTRAVENOUS
  Filled 2023-03-29: qty 150

## 2023-03-29 MED ORDER — CALCIUM GLUCONATE-NACL 2-0.675 GM/100ML-% IV SOLN
2.0000 g | Freq: Once | INTRAVENOUS | Status: AC
  Administered 2023-03-29: 2000 mg via INTRAVENOUS
  Filled 2023-03-29: qty 100

## 2023-03-29 NOTE — Progress Notes (Signed)
Pharmacy Antibiotic Note  [name] is a 52 y.o. female admitted on 03/27/2023 with sepsis with unclear etiology, possibly GI vs UTI vs cellulitis.  Pharmacy has been consulted for vancomycin and cefepime dosing.  SCr 3.66 > 2.54 > 1.15.  Random vanc level 24h after loading dose = 7, pt appears to be clearing vanc well with improving renal function.  Plan: Vancomycin 750 mg IV Q24H. Goal AUC 400-550.  Expected AUC 450. Increase cefepime to 2g IV Q12H.  Height: 5\' 2"  (157.5 cm) Weight: 50 kg (110 lb 3.7 oz) IBW/kg (Calculated) : 50.1  Temp (24hrs), Avg:98 F (36.7 C), Min:97.7 F (36.5 C), Max:98.3 F (36.8 C)  Recent Labs  Lab 03/27/23 1548 03/27/23 1628 03/27/23 1717 03/28/23 0240 03/28/23 1014 03/29/23 0233  WBC  --  16.2*  --  14.2*  --   --   CREATININE 3.66*  --   --  2.45*  --  1.15*  LATICACIDVEN 5.1*  --  5.0*  --  1.1  --   VANCORANDOM  --   --   --   --   --  7    Estimated Creatinine Clearance: 44.1 mL/min (A) (by C-G formula based on SCr of 1.15 mg/dL (H)).    Allergies  Allergen Reactions   Penicillins Shortness Of Breath     Tolerated ceftriaxone 03/27/23   Sulfa Antibiotics Shortness Of Breath   Aspirin Nausea And Vomiting   Enoxaparin Other (See Comments)    Has been known to cause her blood clots in her legs   Prednisone Other (See Comments)    Pancreatitis, but has to take for asthma sometimes   Venofer [Iron Sucrose] Nausea Only and Swelling    Thank you for allowing pharmacy to be a part of this patient's care.  Vernard Gambles, PharmD, BCPS  03/29/2023 4:13 AM

## 2023-03-29 NOTE — Evaluation (Signed)
Physical Therapy Brief Evaluation and Discharge Note Patient Details Name: [name] MRN: [id number] DOB: [birth year] Today's Date: 03/29/2023   History of Present Illness  Pt is 52 year old presented to [facility] on  03/27/23 for septic shock likely due to lt heel wound/cellulitis. Pt also with partial SBO due to chronic opiod use. PMH - DM, copd, bipolar disorder, chronic pancreatitis,diabetic neuropathy  Clinical Impression  Pt at or very close to baseline with mobility. Already has rolling walker at home to use. No further skilled PT needed.        PT Assessment Patient does not need any further PT services  Assistance Needed at Discharge  PRN    Equipment Recommendations None recommended by PT  Recommendations for Other Services       Precautions/Restrictions Precautions Precautions: None Restrictions Weight Bearing Restrictions: No        Mobility  Bed Mobility Rolling: Independent Supine/Sidelying to sit: Independent Sit to supine/sidelying: Independent    Transfers Overall transfer level: Modified independent Equipment used: Rolling walker (2 wheels)                    Ambulation/Gait Ambulation/Gait assistance: Modified independent (Device/Increase time) Gait Distance (Feet): 100 Feet Assistive device: Rolling walker (2 wheels) Gait Pattern/deviations: Step-through pattern, Decreased stride length Gait Speed: Below normal General Gait Details: Steady gait with wakler  Home Activity Instructions    Stairs            Modified Rankin (Stroke Patients Only)        Balance Overall balance assessment: Mild deficits observed, not formally tested                        Pertinent Vitals/Pain PT - Brief Vital Signs All Vital Signs Stable: Yes Pain Assessment Pain Assessment: Faces Faces Pain Scale: Hurts little more Pain Location: lt heel Pain Descriptors / Indicators: Grimacing, Guarding Pain Intervention(s): Monitored  during session, Limited activity within patient's tolerance     Home Living Family/patient expects to be discharged to:: Private residence Living Arrangements: Alone   Home Environment: Stairs to enter;Rail - right;Rail - left  Stairs-Number of Steps: 2 Home Equipment: Agricultural consultant (2 wheels)        Prior Function Level of Independence: Independent with assistive device(s) Comments: Uses walker at times    UE/LE Assessment   UE ROM/Strength/Tone/Coordination: WFL    LE ROM/Strength/Tone/Coordination: Generalized weakness      Communication   Communication Communication: No apparent difficulties     Cognition Overall Cognitive Status: Impaired Comments: Low health literacy     General Comments      Exercises     Assessment/Plan    PT Problem List         PT Visit Diagnosis Other abnormalities of gait and mobility (R26.89)    No Skilled PT Patient is modified independent with all activity/mobility;Patient at baseline level of functioning   Co-evaluation                AMPAC 6 Clicks Help needed turning from your back to your side while in a flat bed without using bedrails?: None Help needed moving from lying on your back to sitting on the side of a flat bed without using bedrails?: None Help needed moving to and from a bed to a chair (including a wheelchair)?: None Help needed standing up from a chair using your arms (e.g., wheelchair or bedside chair)?: None Help needed to  walk in hospital room?: None Help needed climbing 3-5 steps with a railing? : A Little 6 Click Score: 23      End of Session   Activity Tolerance: Patient tolerated treatment well Patient left: in chair;with call bell/phone within reach   PT Visit Diagnosis: Other abnormalities of gait and mobility (R26.89)     Time: 1610-9604 PT Time Calculation (min) (ACUTE ONLY): 16 min  Charges:   PT Evaluation $PT Eval Low Complexity: 1 Low      [facility] PT Acute  Rehabilitation Services Office [phone]   Angelina Ok [facility]  03/29/2023, 2:31 PM

## 2023-03-29 NOTE — Plan of Care (Signed)
  Problem: Pain Managment: Goal: General experience of comfort will improve 03/29/2023 0023 by Sammuel Cooper, RN Outcome: Progressing 03/29/2023 0022 by Sammuel Cooper, RN Outcome: Progressing   Problem: Safety: Goal: Ability to remain free from injury will improve 03/29/2023 0023 by Sammuel Cooper, RN Outcome: Progressing 03/29/2023 0022 by Sammuel Cooper, RN Outcome: Progressing

## 2023-03-29 NOTE — Consult Note (Signed)
WOC Nurse Consult Note: Reason for Consult: left foot wounds Patient to ED with CP, history of left foot/heel blister over a week ago, treated with oral antibiotics. Since then has worsened and new area dorsal foot. Reviewed images  Wound type: Serous filled blister; resolving left heel Weeping area dorsal foot; raised, skin changes   Pressure Injury POA: NA Measurement:see nursing notes (4.5cm x 5cm x 0cm) Wound OZH:YQMVHQ skin; erythema  Drainage (amount, consistency, odor) none Periwound: intact  Dressing procedure/placement/frequency: Cleanse left heel and left dorsal foot with saline, pat dry  Cover each area with single layer of xeroform gauze, top with dry dressing. Wrap with kerlix  Change daily  Elevate extremity as much as possible   Re consult if needed, will not follow at this time. Thanks  Gicela Schwarting [employer], RN,CWOCN, CNS, CWON-AP [phone])

## 2023-03-29 NOTE — Progress Notes (Signed)
Occupational Therapy Discharge Patient Details Name: [name] MRN: [id number] DOB: [birth year] Today's Date: 03/29/2023 Time:  -     Patient discharged from OT services secondary to Pt at or near baseline per PT [facility] OT will screen at this time and sign off        GO     [name] 03/29/2023, 11:34 AM

## 2023-03-29 NOTE — Progress Notes (Signed)
PROGRESS NOTE    [name]  UEA:[id number] DOB: 1970/12/01 DOA: 03/27/2023 PCP: Elfredia Nevins, MD   Brief Narrative:  52 year old female with past medical history as below, which is significant for chronic pancreatitis, diabetes, COPD, iron deficient anemia and bipolar disorder. She presented to [facility] emergency department on 8/31 with complaints of erythema and blisters left lower extremity as well as nausea and poor oral intake. Also complained of abdominal pain with radiation to her back. Symptoms progressed to include shortness of breath at the height of the abdominal/chest pain. Reported last bowel movement 2 to 3 weeks prior to presentation, however, this is not totally abnormal. Patient was hypotensive and initiated on IVF resuscitation. Laboratory evaluation significant for lactic acid of 5 and WBC 16.2. Imaging included x-ray of the left foot showing soft tissue swelling, and CT chest abdomen pelvis significant for large stool burden with partial bowel obstruction. Despite 30 cc/kg fluid resuscitation the patient remained for line hypotensive. Lactic acid did not significantly improve. PCCM was asked to admit to [facility], ICU.  Patient eventually was taken off of vasopressors on the morning of 03/28/2023, did well for few hours, transferred under [facility] on 03/29/2023.  Assessment & Plan:   Principal Problem:   Sepsis (HCC) Active Problems:   Hypovolemic shock (HCC)  Combined septic/hypovolemic shock, likely due to infected left heel wound and surrounding cellulitis, POA Patient weaned off of vasopressors on 03/28/2023.  She has been started on midodrine.  Blood pressure on the low normal side.  She complains of intermittent dizziness.  On exam, erythema is almost resolved.  Cultures are negative.  MRSA NexGen negative, doubt MRSA infection, will discontinue vancomycin, continue cefepime.   Partial small bowel obstruction: Due to severe constipation Large stool burden likely  secondary to opioid induced constipation. Takes 30 mg morphine and 150 mg oxycodone daily..  Acute renal failure: Due to septic ATN.  Creatinine peaked at 3.66, much improved, 1.15 today.  Very close to baseline.  Hypocalcemia/hypomagnesemia Calcium is low at 7.4.  Will replenish some.  Magnesium within normal range.   COPD without exacerbation Continue symbicort (sub-breo) and albuterol   DM type II, controlled Hemoglobin A1c 6.7 Continue sliding scale insulin CBG goal 140-180, had an episode of hypoglycemia of 67 early this morning.  Good   Chronic pancreatitis Hold Zenpep   Iron deficiency anemia Monitor H&H and transfuse if less than 7   Bipolar MDD continue antipsychotic meds  DVT prophylaxis: heparin injection 5,000 Units Start: 03/28/23 0600   Code Status: Full Code  Family Communication:  None present at bedside.  Plan of care discussed with patient in length and he/she verbalized understanding and agreed with it.  Status is: Inpatient Remains inpatient appropriate because: Still feels dizzy and weak.  PT OT to see.  Will observe overnight.   Estimated body mass index is 15.65 kg/m as calculated from the following:   Height as of this encounter: 5\' 2"  (1.575 m).   Weight as of this encounter: 38.8 kg.    Nutritional Assessment: Body mass index is 15.65 kg/m.Marland Kitchen Seen by dietician.  I agree with the assessment and plan as outlined below: Nutrition Status:        . Skin Assessment: I have examined the patient's skin and I agree with the wound assessment as performed by the wound care RN as outlined below:    Consultants:  None  Procedures:  None  Antimicrobials:  Anti-infectives (From admission, onward)    Start  Dose/Rate Route Frequency Ordered Stop   03/29/23 1200  ceFEPIme (MAXIPIME) 2 g in sodium chloride 0.9 % 100 mL IVPB        2 g 200 mL/hr over 30 Minutes Intravenous Every 12 hours 03/29/23 0417     03/29/23 0600  vancomycin (VANCOREADY)  IVPB 750 mg/150 mL        750 mg 150 mL/hr over 60 Minutes Intravenous Every 24 hours 03/29/23 0417     03/29/23 0000  ceFEPIme (MAXIPIME) 2 g in sodium chloride 0.9 % 100 mL IVPB  Status:  Discontinued        2 g 200 mL/hr over 30 Minutes Intravenous Every 24 hours 03/28/23 0045 03/29/23 0416   03/28/23 0145  ceFEPIme (MAXIPIME) 2 g in sodium chloride 0.9 % 100 mL IVPB        2 g 200 mL/hr over 30 Minutes Intravenous  Once 03/28/23 0045 03/28/23 0759   03/28/23 0145  vancomycin (VANCOCIN) IVPB 1000 mg/200 mL premix        1,000 mg 200 mL/hr over 60 Minutes Intravenous  Once 03/28/23 0045 03/28/23 0319   03/28/23 0045  vancomycin variable dose per unstable renal function (pharmacist dosing)  Status:  Discontinued         Does not apply See admin instructions 03/28/23 0045 03/29/23 0413   03/27/23 1845  cefTRIAXone (ROCEPHIN) 1 g in sodium chloride 0.9 % 100 mL IVPB  Status:  Discontinued        1 g 200 mL/hr over 30 Minutes Intravenous  Once 03/27/23 1839 03/27/23 1840   03/27/23 1845  azithromycin (ZITHROMAX) 500 mg in sodium chloride 0.9 % 250 mL IVPB        500 mg 250 mL/hr over 60 Minutes Intravenous  Once 03/27/23 1839 03/27/23 2021   03/27/23 1845  metroNIDAZOLE (FLAGYL) IVPB 500 mg        500 mg 100 mL/hr over 60 Minutes Intravenous  Once 03/27/23 1839 03/27/23 2017   03/27/23 1845  cefTRIAXone (ROCEPHIN) 2 g in sodium chloride 0.9 % 100 mL IVPB        2 g 200 mL/hr over 30 Minutes Intravenous  Once 03/27/23 1840 03/27/23 1932   03/27/23 1830  levofloxacin (LEVAQUIN) IVPB 750 mg  Status:  Discontinued        750 mg 100 mL/hr over 90 Minutes Intravenous  Once 03/27/23 1829 03/27/23 1830         Subjective: Patient seen and examined.  Complains of intermittent dizziness.  No other complaint.  Objective: Vitals:   03/29/23 0418 03/29/23 0500 03/29/23 0753 03/29/23 0844  BP:   104/74   Pulse:   77 89  Resp: 16  16 18   Temp:   97.7 F (36.5 C)   TempSrc:   Oral   SpO2:    98% 94%  Weight:  38.8 kg    Height:        Intake/Output Summary (Last 24 hours) at 03/29/2023 1233 Last data filed at 03/29/2023 0753 Gross per 24 hour  Intake 990.02 ml  Output --  Net 990.02 ml   Filed Weights   03/27/23 1428 03/28/23 0500 03/29/23 0500  Weight: 49.9 kg 50 kg 38.8 kg    Examination:  General exam: Appears calm and comfortable  Respiratory system: Clear to auscultation. Respiratory effort normal. Cardiovascular system: S1 & S2 heard, RRR. No JVD, murmurs, rubs, gallops or clicks. No pedal edema. Gastrointestinal system: Abdomen is nondistended, soft and nontender. No organomegaly or  masses felt. Normal bowel sounds heard. Central nervous system: Alert and oriented. No focal neurological deficits. Extremities: Has popped up blister at the left heel, very minimal erythema. Psychiatry: Judgement and insight appear normal. Mood & affect appropriate.    Data Reviewed: I have personally reviewed following labs and imaging studies  CBC: Recent Labs  Lab 03/27/23 1628 03/28/23 0240  WBC 16.2* 14.2*  NEUTROABS 14.6*  --   HGB 12.1 11.4*  HCT 37.0 35.0*  MCV 100.0 98.3  PLT 278 241   Basic Metabolic Panel: Recent Labs  Lab 03/27/23 1548 03/28/23 0240 03/29/23 0233  NA 134* 138 137  K 4.9 4.3 3.6  CL 93* 99 104  CO2 22 24 22   GLUCOSE 111* 91 71  BUN 53* 48* 29*  CREATININE 3.66* 2.45* 1.15*  CALCIUM 6.8* 6.7* 7.4*  MG  --  1.0* 1.8  PHOS  --  4.4 2.0*   GFR: Estimated Creatinine Clearance: 34.3 mL/min (A) (by C-G formula based on SCr of 1.15 mg/dL (H)). Liver Function Tests: Recent Labs  Lab 03/27/23 1548 03/29/23 0233  AST 26  --   ALT 21  --   ALKPHOS 101  --   BILITOT 0.8  --   PROT 5.8*  --   ALBUMIN 2.6* 1.6*   Recent Labs  Lab 03/27/23 1548 03/28/23 0240  LIPASE 18 17   No results for input(s): "AMMONIA" in the last 168 hours. Coagulation Profile: Recent Labs  Lab 03/27/23 1740  INR 0.9   Cardiac Enzymes: No results  for input(s): "CKTOTAL", "CKMB", "CKMBINDEX", "TROPONINI" in the last 168 hours. BNP (last 3 results) No results for input(s): "PROBNP" in the last 8760 hours. HbA1C: Recent Labs    03/28/23 0240  HGBA1C 6.7*   CBG: Recent Labs  Lab 03/28/23 2108 03/29/23 0002 03/29/23 0311 03/29/23 0752 03/29/23 1202  GLUCAP 83 129* 67* 95 121*   Lipid Profile: No results for input(s): "CHOL", "HDL", "LDLCALC", "TRIG", "CHOLHDL", "LDLDIRECT" in the last 72 hours. Thyroid Function Tests: No results for input(s): "TSH", "T4TOTAL", "FREET4", "T3FREE", "THYROIDAB" in the last 72 hours. Anemia Panel: No results for input(s): "VITAMINB12", "FOLATE", "FERRITIN", "TIBC", "IRON", "RETICCTPCT" in the last 72 hours. Sepsis Labs: Recent Labs  Lab 03/27/23 1548 03/27/23 1717 03/28/23 1014  LATICACIDVEN 5.1* 5.0* 1.1    Recent Results (from the past 240 hour(s))  SARS Coronavirus 2 by RT PCR (hospital order, performed in [facility] hospital lab) *cepheid single result test* Anterior Nasal Swab     Status: None   Collection Time: 03/27/23  3:33 PM   Specimen: Anterior Nasal Swab  Result Value Ref Range Status   SARS Coronavirus 2 by RT PCR NEGATIVE NEGATIVE Final    Comment: (NOTE) SARS-CoV-2 target nucleic acids are NOT DETECTED.  The SARS-CoV-2 RNA is generally detectable in upper and lower respiratory specimens during the acute phase of infection. The lowest concentration of SARS-CoV-2 viral copies this assay can detect is 250 copies / mL. A negative result does not preclude SARS-CoV-2 infection and should not be used as the sole basis for treatment or other patient management decisions.  A negative result may occur with improper specimen collection / handling, submission of specimen other than nasopharyngeal swab, presence of viral mutation(s) within the areas targeted by this assay, and inadequate number of viral copies (<250 copies / mL). A negative result must be combined with  clinical observations, patient history, and epidemiological information.  Fact Sheet for Patients:   RoadLapTop.co.za  Fact  Sheet for Healthcare Providers: http://kim-miller.com/  This test is not yet approved or  cleared by the [place] and has been authorized for detection and/or diagnosis of SARS-CoV-2 by FDA under an Emergency Use Authorization (EUA).  This EUA will remain in effect (meaning this test can be used) for the duration of the COVID-19 declaration under Section 564(b)(1) of the Act, 21 U.S.C. section 360bbb-3(b)(1), unless the authorization is terminated or revoked sooner.  Performed at [facility], [address], [place], [place] [address]   Blood Culture (routine x 2)     Status: None (Preliminary result)   Collection Time: 03/27/23  3:46 PM   Specimen: BLOOD  Result Value Ref Range Status   Specimen Description BLOOD RIGHT ANTECUBITAL  Final   Special Requests   Final    BOTTLES DRAWN AEROBIC AND ANAEROBIC Blood Culture adequate volume   Culture   Final    NO GROWTH 2 DAYS Performed at [facility], [address] [place], [place] [address]    Report Status PENDING  Incomplete  Blood Culture (routine x 2)     Status: None (Preliminary result)   Collection Time: 03/27/23  4:28 PM   Specimen: Left Antecubital; Blood  Result Value Ref Range Status   Specimen Description LEFT ANTECUBITAL BLOOD  Final   Special Requests   Final    BOTTLES DRAWN AEROBIC AND ANAEROBIC Blood Culture adequate volume   Culture   Final    NO GROWTH 2 DAYS Performed at [facility], [address] [place], [place] [address]    Report Status PENDING  Incomplete  MRSA Next Gen by PCR, Nasal     Status: None   Collection Time: 03/27/23 11:15 PM   Specimen: Nasal Mucosa; Nasal Swab  Result Value Ref Range Status   MRSA by PCR Next Gen NOT DETECTED NOT DETECTED Final    Comment: (NOTE) The GeneXpert MRSA Assay (FDA approved for  NASAL specimens only), is one component of a comprehensive MRSA colonization surveillance program. It is not intended to diagnose MRSA infection nor to guide or monitor treatment for MRSA infections. Test performance is not FDA approved in patients less than 28 years old. Performed at [facility], [address] [place], [place] [address]      Radiology Studies: CT CHEST ABDOMEN PELVIS WO CONTRAST  Result Date: 03/27/2023 CLINICAL DATA:  Sepsis, chest pain, the pain radiates to the back. Shortness of breath on exertion. Cough for 2 days. Low blood pressure. EXAM: CT CHEST, ABDOMEN AND PELVIS WITHOUT CONTRAST TECHNIQUE: Multidetector CT imaging of the chest, abdomen and pelvis was performed following the standard protocol without IV contrast. RADIATION DOSE REDUCTION: This exam was performed according to the departmental dose-optimization program which includes automated exposure control, adjustment of the mA and/or kV according to patient size and/or use of iterative reconstruction technique. COMPARISON:  Chest radiograph 03/27/2023; CT abdomen and pelvis 11/26/2022 and CT chest 05/11/2022 FINDINGS: CT CHEST FINDINGS Cardiovascular: Normal heart size. No pericardial effusion. Aortic atherosclerotic calcification. Mediastinum/Nodes: Large hiatal hernia containing a portion of the stomach within the chest. Small amount of adherent debris within the upper trachea. No thoracic adenopathy noting limitations of noncontrast exam. Lungs/Pleura: Mosaic attenuation of the lungs compatible with air trapping. Mild diffuse bronchial wall thickening. Layering debris in the left mainstem bronchus. No focal consolidation, pleural effusion, or pneumothorax. Musculoskeletal: Unchanged superior endplate compression fracture of T12. No acute fracture. CT ABDOMEN PELVIS FINDINGS Hepatobiliary: Cholecystectomy. Unremarkable noncontrast appearance of the  liver and biliary tree. Pancreas: No acute abnormality. Spleen:  Unremarkable. Adrenals/Urinary Tract: Stable adrenal glands no urinary calculi or hydronephrosis. Unremarkable bladder. Stomach/Bowel: Diffuse dilation of the small bowel upstream from an area of fecalized ileum in the right lower quadrant. There is smooth tapering to the area of fecalized ileum (circa series 6/image 66-86). The fecalized ileum tapers the decompressed terminal ileum (circa series 6/image 68-90) with possible transition point and series 6/image 90 in the right lower quadrant. Large colonic stool load in the normal caliber colon. Redundant sigmoid colon. Normal appendix. Large hiatal hernia containing a portion of the stomach within the chest. Vascular/Lymphatic: Aortic atherosclerosis. No enlarged abdominal or pelvic lymph nodes. Reproductive: Hysterectomy. Other: No free intraperitoneal fluid or air. Musculoskeletal: Unchanged superior endplate compression fracture of L1. No acute fracture. IMPRESSION: 1. Diffuse dilation of the small bowel upstream from an area of fecalized ileum in the right lower quadrant. There is smooth tapering to the area of fecalized ileum. The fecalized ileum tapers into decompressed terminal ileum with possible transition point in the right lower quadrant. Findings are concerning for partial small-bowel obstruction. 2. Large colonic stool burden compatible with constipation. 3. Large hiatal hernia containing a portion of the stomach within the chest. 4. Pulmonary findings compatible with airway inflammation/infection. Query chronic aspiration given large hiatal hernia containing a portion of the stomach. Aortic Atherosclerosis (ICD10-I70.0). Electronically Signed   By: Minerva Fester M.D.   On: 03/27/2023 18:22   DG Foot Complete Left  Result Date: 03/27/2023 CLINICAL DATA:  Oozing from foot.  Sepsis. EXAM: LEFT FOOT - COMPLETE 3+ VIEW COMPARISON:  01/26/2020, without report FINDINGS: Forefoot soft tissue swelling dorsally. No soft tissue gas or radiopaque foreign  object. No osseous destruction or soft tissue ulcer identified. Nonacute fractures of the second through fourth metatarsal shafts. The second fracture is incompletely healed with minimal comminution. IMPRESSION: Soft tissue swelling without other explanation for "oozing". Nonacute second through fourth metatarsal fractures as detailed above. Electronically Signed   By: Jeronimo Greaves M.D.   On: 03/27/2023 17:45   DG Chest Port 1 View  Result Date: 03/27/2023 CLINICAL DATA:  Chest pain.  Shortness of breath. EXAM: PORTABLE CHEST 1 VIEW COMPARISON:  03/11/2022 FINDINGS: The lungs are clear without focal pneumonia, edema, pneumothorax or pleural effusion. Interstitial markings are diffusely coarsened with chronic features. Streaky opacity at the bases, left greater than right is compatible with atelectasis or scarring. The cardiopericardial silhouette is within normal limits for size. Large hiatal hernia. IMPRESSION: 1. Chronic interstitial coarsening with bibasilar atelectasis or scarring. 2. Large hiatal hernia. Electronically Signed   By: Kennith Center M.D.   On: 03/27/2023 17:45    Scheduled Meds:  amitriptyline  100 mg Oral QHS   ARIPiprazole  10 mg Oral Daily   atorvastatin  20 mg Oral Daily   busPIRone  15 mg Oral BID   Chlorhexidine Gluconate Cloth  6 each Topical Daily   DULoxetine  60 mg Oral Daily   fluticasone furoate-vilanterol  1 puff Inhalation Daily   heparin  5,000 Units Subcutaneous Q8H   insulin aspart  0-9 Units Subcutaneous Q4H   levothyroxine  25 mcg Oral Q0600   midodrine  10 mg Oral TID WC   polyethylene glycol  17 g Oral BID   senna-docusate  1 tablet Oral BID   Continuous Infusions:  sodium chloride 10 mL/hr at 03/28/23 1400   ceFEPime (MAXIPIME) IV     vancomycin 750 mg (03/29/23 0520)  LOS: 2 days   Hughie Closs, MD [facility]  03/29/2023, 12:33 PM   *Please note that this is a verbal dictation therefore any spelling or grammatical errors are due to  the "Dragon Medical One" system interpretation.  Please page via Amion and do not message via secure chat for urgent patient care matters. Secure chat can be used for non urgent patient care matters.  How to contact the [facility] Attending or Consulting provider 7A - 7P or covering provider during after hours 7P -7A, for this patient?  Check the care team in [facility] and look for a) attending/consulting [facility] provider listed and b) the [facility] team listed. Page or secure chat 7A-7P. Log into [email or web address] and use [facility]'s universal password to access. If you do not have the password, please contact the hospital operator. Locate the [facility] provider you are looking for under [facility] and page to a number that you can be directly reached. If you still have difficulty reaching the provider, please page the [facility] (Director on Call) for the Hospitalists listed on amion for assistance.

## 2023-03-30 ENCOUNTER — Other Ambulatory Visit (HOSPITAL_COMMUNITY): Payer: Self-pay

## 2023-03-30 ENCOUNTER — Encounter (HOSPITAL_COMMUNITY): Payer: Self-pay | Admitting: Hematology

## 2023-03-30 DIAGNOSIS — R6521 Severe sepsis with septic shock: Secondary | ICD-10-CM | POA: Diagnosis not present

## 2023-03-30 DIAGNOSIS — L03116 Cellulitis of left lower limb: Secondary | ICD-10-CM | POA: Insufficient documentation

## 2023-03-30 DIAGNOSIS — N179 Acute kidney failure, unspecified: Secondary | ICD-10-CM | POA: Diagnosis not present

## 2023-03-30 DIAGNOSIS — A419 Sepsis, unspecified organism: Secondary | ICD-10-CM | POA: Diagnosis not present

## 2023-03-30 LAB — GLUCOSE, CAPILLARY
Glucose-Capillary: 54 mg/dL — ABNORMAL LOW (ref 70–99)
Glucose-Capillary: 105 mg/dL — ABNORMAL HIGH (ref 70–99)
Glucose-Capillary: 96 mg/dL (ref 70–99)
Glucose-Capillary: 95 mg/dL (ref 70–99)

## 2023-03-30 LAB — PTH, INTACT AND CALCIUM
Calcium, Total (PTH): 7.5 mg/dL — ABNORMAL LOW (ref 8.7–10.2)
PTH: 5 pg/mL — ABNORMAL LOW (ref 15–65)

## 2023-03-30 LAB — RENAL FUNCTION PANEL
Albumin: 1.8 g/dL — ABNORMAL LOW (ref 3.5–5.0)
Anion gap: 7 (ref 5–15)
BUN: 19 mg/dL (ref 6–20)
CO2: 27 mmol/L (ref 22–32)
Calcium: 8.2 mg/dL — ABNORMAL LOW (ref 8.9–10.3)
Chloride: 105 mmol/L (ref 98–111)
Creatinine, Ser: 0.96 mg/dL (ref 0.44–1.00)
GFR, Estimated: >60 mL/min (ref >60)
Glucose, Bld: 82 mg/dL (ref 70–99)
Phosphorus: 2.7 mg/dL (ref 2.5–4.6)
Potassium: 3.2 mmol/L — ABNORMAL LOW (ref 3.5–5.1)
Sodium: 139 mmol/L (ref 135–145)

## 2023-03-30 LAB — MAGNESIUM: Magnesium: 1.3 mg/dL — ABNORMAL LOW (ref 1.7–2.4)

## 2023-03-30 MED ORDER — METFORMIN HCL 500 MG PO TABS: 1000.0000 mg | ORAL_TABLET | Freq: Two times a day (BID) | ORAL | Status: DC

## 2023-03-30 MED ORDER — MAGNESIUM SULFATE 4 GM/100ML IV SOLN
4.0000 g | Freq: Once | INTRAVENOUS | Status: AC
  Administered 2023-03-30: 4 g via INTRAVENOUS
  Filled 2023-03-30 (×2): qty 100

## 2023-03-30 MED ORDER — POTASSIUM CHLORIDE CRYS ER 20 MEQ PO TBCR
40.0000 meq | EXTENDED_RELEASE_TABLET | ORAL | Status: DC
  Administered 2023-03-30: 40 meq via ORAL
  Filled 2023-03-30: qty 2

## 2023-03-30 MED ORDER — FUROSEMIDE 20 MG PO TABS: 20.0000 mg | ORAL_TABLET | Freq: Every day | ORAL | Status: DC

## 2023-03-30 MED ORDER — MIDODRINE HCL 5 MG PO TABS
ORAL_TABLET | ORAL | 0 refills | Status: DC
  Filled 2023-03-30: qty 63, 28d supply, fill #0

## 2023-03-30 MED ORDER — CEPHALEXIN 500 MG PO CAPS
500.0000 mg | ORAL_CAPSULE | Freq: Three times a day (TID) | ORAL | 0 refills | Status: DC
Start: 2023-03-30 — End: 2023-04-07
  Filled 2023-03-30: qty 18, 6d supply, fill #0

## 2023-03-30 NOTE — Care Management Important Message (Signed)
Important Message  Patient Details  Name: [name] MRN: [id number] Date of Birth: Aug 15, 1970   Medicare Important Message Given:  Yes     Sherilyn Banker 03/30/2023, 12:30 PM

## 2023-03-30 NOTE — Discharge Summary (Signed)
Physician Discharge Summary  [name] WUJ:[id number] DOB: 02/03/1971 DOA: 03/27/2023  PCP: Elfredia Nevins, MD  Admit date: 03/27/2023 Discharge date: 03/30/2023 30 Day Unplanned Readmission Risk Score    Flowsheet Row ED to Hosp-Admission (Current) from 03/27/2023 in [facility]  SURGICAL  30 Day Unplanned Readmission Risk Score (%) 23.34 Filed at 03/30/2023 0801       This score is the patient's risk of an unplanned readmission within 30 days of being discharged (0 -100%). The score is based on dignosis, age, lab data, medications, orders, and past utilization.   Low:  0-14.9   Medium: 15-21.9   High: 22-29.9   Extreme: 30 and above          Admitted From: Home Disposition: Home  Recommendations for Outpatient Follow-up:  Follow up with PCP in 1-2 weeks Please obtain BMP/CBC in one week Please follow up with your PCP on the following pending results: Unresulted Labs (From admission, onward)     Start     Ordered   03/29/23 0500  Renal function panel  Daily,   R     Question:  Specimen collection method  Answer:  Lab=Lab collect   03/28/23 0751   03/29/23 0500  Magnesium  Daily,   R     Question:  Specimen collection method  Answer:  Lab=Lab collect   03/28/23 0751   03/27/23 1448  CBC with Differential  Once,   STAT        03/27/23 1447              Home Health: None Equipment/Devices: None  Discharge Condition: Stable CODE STATUS: Full code Diet recommendation: Low-sodium/cardiac  Subjective: Seen and examined.  No complaints.  She feels ready to go home today.  Brief/Interim Summary: 52 year old female with past medical history significant for chronic pancreatitis, diabetes, COPD, iron deficient anemia and bipolar disorder. She presented to [facility] emergency department on 8/31 with complaints of erythema and blisters left lower extremity as well as nausea and poor oral intake. Also complained of abdominal pain with radiation  to her back. Symptoms progressed to include shortness of breath at the height of the abdominal/chest pain. Reported last bowel movement 2 to 3 weeks prior to presentation, however, this is not totally abnormal. Patient was hypotensive and initiated on IVF resuscitation. Laboratory evaluation significant for lactic acid of 5 and WBC 16.2. Imaging included x-ray of the left foot showing soft tissue swelling, and CT chest abdomen pelvis significant for large stool burden with partial bowel obstruction. Despite 30 cc/kg fluid resuscitation the patient remained hypotensive. Lactic acid did not significantly improve. PCCM was asked to admit to [facility], ICU since she required vasopressors.  She was started on antibiotics.  Patient eventually was taken off of vasopressors on the morning of 03/28/2023, did well for few hours, transferred under [facility] on 03/29/2023.  Further details below.  Combined septic/hypovolemic shock, likely due to infected left heel wound and surrounding cellulitis, POA Patient weaned off of vasopressors on 03/28/2023.  She has been started on midodrine.  Blood pressure on the low normal side. On exam, erythema is almost resolved.  Cultures are negative.  MRSA NexGen negative, doubt MRSA infection.  She was on vancomycin and cefepime since admission through 9-24, vancomycin was discontinued.  Cefepime continued.  Now she is being discharged on Keflex for 6 more days.  I prescribed her weaning doses of the midodrine.   Partial small bowel obstruction: Due to  severe constipation Large stool burden likely secondary to opioid induced constipation. Takes 30 mg morphine and 150 mg oxycodone daily..  Now resolved.   Acute renal failure: Due to septic ATN.  Creatinine peaked at 3.66, much improved, back to baseline at 0.96 today.  I have advised her to hold and resume metformin and Lasix as well as lisinopril from 04/01/2023.   Hypocalcemia/hypomagnesemia/hypokalemia Calcium much better at 8.2.  No further  replenishment needed.  She has low magnesium and low potassium today.  She will get replacement for both before discharge.   COPD without exacerbation Continue symbicort (sub-breo) and albuterol   DM type II, controlled Hemoglobin A1c 6.7 Resume home medications.   Bipolar MDD continue antipsychotic meds  Discharge plan was discussed with patient and/or family member and they verbalized understanding and agreed with it.  Discharge Diagnoses:  Principal Problem:   Sepsis (HCC) Active Problems:   Hypokalemia   DM type 2 (diabetes mellitus, type 2) (HCC)   Diabetic neuropathy (HCC)   Partial small bowel obstruction (HCC)   AKI (acute kidney injury) (HCC)   Hypovolemic shock (HCC)   Cellulitis of left foot   Hypomagnesemia    Discharge Instructions   Allergies as of 03/30/2023       Reactions   Penicillins Shortness Of Breath    Tolerated ceftriaxone 03/27/23   Sulfa Antibiotics Shortness Of Breath   Aspirin Nausea And Vomiting   Enoxaparin Other (See Comments)   Has been known to cause her blood clots in her legs   Prednisone Other (See Comments)   Pancreatitis, but has to take for asthma sometimes   Venofer [iron Sucrose] Nausea Only, Swelling        Medication List     STOP taking these medications    doxycycline 100 MG capsule Commonly known as: VIBRAMYCIN       TAKE these medications    albuterol 108 (90 Base) MCG/ACT inhaler Commonly known as: VENTOLIN HFA Inhale 2 puffs into the lungs daily as needed for wheezing or shortness of breath.   albuterol (2.5 MG/3ML) 0.083% nebulizer solution Commonly known as: PROVENTIL Take 2.5 mg by nebulization every 6 (six) hours as needed for wheezing or shortness of breath.   amitriptyline 100 MG tablet Commonly known as: ELAVIL Take 100 mg by mouth at bedtime.   ARIPiprazole 10 MG tablet Commonly known as: ABILIFY Take 10 mg by mouth daily.   atorvastatin 20 MG tablet Commonly known as: LIPITOR Take 20  mg by mouth daily.   budesonide-formoterol 80-4.5 MCG/ACT inhaler Commonly known as: Symbicort Inhale 2 puffs into the lungs 2 (two) times daily. What changed: when to take this   busPIRone 15 MG tablet Commonly known as: BUSPAR Take 15 mg by mouth 2 (two) times daily.   cephALEXin 500 MG capsule Commonly known as: KEFLEX Take 1 capsule (500 mg total) by mouth 3 (three) times daily for 6 days.   DULoxetine 60 MG capsule Commonly known as: CYMBALTA Take 60 mg by mouth daily.   furosemide 20 MG tablet Commonly known as: LASIX Take 1 tablet (20 mg total) by mouth daily. Start taking on: April 01, 2023 What changed: These instructions start on April 01, 2023. If you are unsure what to do until then, ask your doctor or other care provider.   gabapentin 300 MG capsule Commonly known as: NEURONTIN Take 300 mg by mouth 3 (three) times daily.   levothyroxine 25 MCG tablet Commonly known as: SYNTHROID Take 25 mcg by  mouth daily before breakfast.   lisinopril 2.5 MG tablet Commonly known as: ZESTRIL Take 2.5 mg by mouth daily.   metFORMIN 500 MG tablet Commonly known as: GLUCOPHAGE Take 2 tablets (1,000 mg total) by mouth 2 (two) times daily. Start taking on: April 01, 2023 What changed: These instructions start on April 01, 2023. If you are unsure what to do until then, ask your doctor or other care provider.   methocarbamol 500 MG tablet Commonly known as: ROBAXIN Take 1,000 mg by mouth in the morning and at bedtime.   midodrine 5 MG tablet Commonly known as: PROAMATINE Take 1 tablet (5 mg total) by mouth 3 (three) times daily with meals for 14 days, THEN 1 tablet (5 mg total) 2 (two) times daily with a meal for 7 days, THEN 1 tablet (5 mg total) daily for 7 days. Start taking on: March 30, 2023   morphine 15 MG 12 hr tablet Commonly known as: MS CONTIN Take 15 mg by mouth 2 (two) times daily.   ondansetron 4 MG tablet Commonly known as: ZOFRAN Take 4  mg by mouth every 8 (eight) hours as needed for vomiting or nausea.   oxycodone 30 MG immediate release tablet Commonly known as: ROXICODONE Take 30 mg by mouth 5 (five) times daily.   pantoprazole 40 MG tablet Commonly known as: PROTONIX Take 40 mg by mouth 2 (two) times daily.   polyethylene glycol powder 17 GM/SCOOP powder Commonly known as: GLYCOLAX/MIRALAX Take 17 g by mouth 2 (two) times daily. Until daily soft stools  OTC What changed:  when to take this reasons to take this   valACYclovir 500 MG tablet Commonly known as: VALTREX Take 500 mg by mouth daily.   Zenpep 20000-63000 units Cpep Generic drug: Pancrelipase (Lip-Prot-Amyl) Take 2 capsules by mouth in the morning and at bedtime.        Follow-up Information     Elfredia Nevins, MD Follow up in 1 week(s).   Specialty: Internal Medicine Contact information: [address] [place] [address] [phone]                Allergies  Allergen Reactions   Penicillins Shortness Of Breath     Tolerated ceftriaxone 03/27/23   Sulfa Antibiotics Shortness Of Breath   Aspirin Nausea And Vomiting   Enoxaparin Other (See Comments)    Has been known to cause her blood clots in her legs   Prednisone Other (See Comments)    Pancreatitis, but has to take for asthma sometimes   Venofer [Iron Sucrose] Nausea Only and Swelling    Consultations: None   Procedures/Studies: CT CHEST ABDOMEN PELVIS WO CONTRAST  Result Date: 03/27/2023 CLINICAL DATA:  Sepsis, chest pain, the pain radiates to the back. Shortness of breath on exertion. Cough for 2 days. Low blood pressure. EXAM: CT CHEST, ABDOMEN AND PELVIS WITHOUT CONTRAST TECHNIQUE: Multidetector CT imaging of the chest, abdomen and pelvis was performed following the standard protocol without IV contrast. RADIATION DOSE REDUCTION: This exam was performed according to the departmental dose-optimization program which includes automated exposure control,  adjustment of the mA and/or kV according to patient size and/or use of iterative reconstruction technique. COMPARISON:  Chest radiograph 03/27/2023; CT abdomen and pelvis 11/26/2022 and CT chest 05/11/2022 FINDINGS: CT CHEST FINDINGS Cardiovascular: Normal heart size. No pericardial effusion. Aortic atherosclerotic calcification. Mediastinum/Nodes: Large hiatal hernia containing a portion of the stomach within the chest. Small amount of adherent debris within the upper trachea. No thoracic adenopathy noting  limitations of noncontrast exam. Lungs/Pleura: Mosaic attenuation of the lungs compatible with air trapping. Mild diffuse bronchial wall thickening. Layering debris in the left mainstem bronchus. No focal consolidation, pleural effusion, or pneumothorax. Musculoskeletal: Unchanged superior endplate compression fracture of T12. No acute fracture. CT ABDOMEN PELVIS FINDINGS Hepatobiliary: Cholecystectomy. Unremarkable noncontrast appearance of the liver and biliary tree. Pancreas: No acute abnormality. Spleen: Unremarkable. Adrenals/Urinary Tract: Stable adrenal glands no urinary calculi or hydronephrosis. Unremarkable bladder. Stomach/Bowel: Diffuse dilation of the small bowel upstream from an area of fecalized ileum in the right lower quadrant. There is smooth tapering to the area of fecalized ileum (circa series 6/image 66-86). The fecalized ileum tapers the decompressed terminal ileum (circa series 6/image 68-90) with possible transition point and series 6/image 90 in the right lower quadrant. Large colonic stool load in the normal caliber colon. Redundant sigmoid colon. Normal appendix. Large hiatal hernia containing a portion of the stomach within the chest. Vascular/Lymphatic: Aortic atherosclerosis. No enlarged abdominal or pelvic lymph nodes. Reproductive: Hysterectomy. Other: No free intraperitoneal fluid or air. Musculoskeletal: Unchanged superior endplate compression fracture of L1. No acute fracture.  IMPRESSION: 1. Diffuse dilation of the small bowel upstream from an area of fecalized ileum in the right lower quadrant. There is smooth tapering to the area of fecalized ileum. The fecalized ileum tapers into decompressed terminal ileum with possible transition point in the right lower quadrant. Findings are concerning for partial small-bowel obstruction. 2. Large colonic stool burden compatible with constipation. 3. Large hiatal hernia containing a portion of the stomach within the chest. 4. Pulmonary findings compatible with airway inflammation/infection. Query chronic aspiration given large hiatal hernia containing a portion of the stomach. Aortic Atherosclerosis (ICD10-I70.0). Electronically Signed   By: Minerva Fester M.D.   On: 03/27/2023 18:22   DG Foot Complete Left  Result Date: 03/27/2023 CLINICAL DATA:  Oozing from foot.  Sepsis. EXAM: LEFT FOOT - COMPLETE 3+ VIEW COMPARISON:  01/26/2020, without report FINDINGS: Forefoot soft tissue swelling dorsally. No soft tissue gas or radiopaque foreign object. No osseous destruction or soft tissue ulcer identified. Nonacute fractures of the second through fourth metatarsal shafts. The second fracture is incompletely healed with minimal comminution. IMPRESSION: Soft tissue swelling without other explanation for "oozing". Nonacute second through fourth metatarsal fractures as detailed above. Electronically Signed   By: Jeronimo Greaves M.D.   On: 03/27/2023 17:45   DG Chest Port 1 View  Result Date: 03/27/2023 CLINICAL DATA:  Chest pain.  Shortness of breath. EXAM: PORTABLE CHEST 1 VIEW COMPARISON:  03/11/2022 FINDINGS: The lungs are clear without focal pneumonia, edema, pneumothorax or pleural effusion. Interstitial markings are diffusely coarsened with chronic features. Streaky opacity at the bases, left greater than right is compatible with atelectasis or scarring. The cardiopericardial silhouette is within normal limits for size. Large hiatal hernia.  IMPRESSION: 1. Chronic interstitial coarsening with bibasilar atelectasis or scarring. 2. Large hiatal hernia. Electronically Signed   By: Kennith Center M.D.   On: 03/27/2023 17:45   US Venous Img Lower Unilateral Left (DVT)  Result Date: 03/22/2023 CLINICAL DATA:  Left lower extremity pain for the past week EXAM: LEFT LOWER EXTREMITY VENOUS DOPPLER ULTRASOUND TECHNIQUE: Gray-scale sonography with compression, as well as color and duplex ultrasound, were performed to evaluate the deep venous system(s) from the level of the common femoral vein through the popliteal and proximal calf veins. COMPARISON:  None Available. FINDINGS: VENOUS Normal compressibility of the common femoral, superficial femoral, and popliteal veins, as well as the visualized calf  veins. Visualized portions of profunda femoral vein and great saphenous vein unremarkable. No filling defects to suggest DVT on grayscale or color Doppler imaging. Doppler waveforms show normal direction of venous flow, normal respiratory plasticity and response to augmentation. Limited views of the contralateral common femoral vein are unremarkable. OTHER Mildly complex fluid collection in the popliteal fossa measures 4.6 x 1.8 x 1.8 cm. The margins are somewhat irregular. No evidence of color flow on color Doppler imaging. Fluid extends into the upper calf. Limitations: none IMPRESSION: 1. No evidence of deep venous thrombosis. 2. Irregular and mildly complex Baker's cyst with extension of fluid into the upper calf and subcutaneous soft tissues. Findings suggest rupture of a Baker's cyst. Electronically Signed   By: Malachy Moan M.D.   On: 03/22/2023 13:09     Discharge Exam: Vitals:   03/30/23 0758 03/30/23 0828  BP:  120/78  Pulse: 87 89  Resp: 18 17  Temp:  98 F (36.7 C)  SpO2: 98% 91%   Vitals:   03/30/23 0446 03/30/23 0500 03/30/23 0758 03/30/23 0828  BP: 114/71   120/78  Pulse: 78  87 89  Resp:   18 17  Temp: 97.6 F (36.4 C)   98 F  (36.7 C)  TempSrc: Oral   Oral  SpO2: 100%  98% 91%  Weight:  36.2 kg    Height:        General: Pt is alert, awake, not in acute distress Cardiovascular: RRR, S1/S2 +, no rubs, no gallops Respiratory: CTA bilaterally, no wheezing, no rhonchi Abdominal: Soft, NT, ND, bowel sounds + Extremities: no edema, no cyanosis    The results of significant diagnostics from this hospitalization (including imaging, microbiology, ancillary and laboratory) are listed below for reference.     Microbiology: Recent Results (from the past 240 hour(s))  SARS Coronavirus 2 by RT PCR (hospital order, performed in [facility] hospital lab) *cepheid single result test* Anterior Nasal Swab     Status: None   Collection Time: 03/27/23  3:33 PM   Specimen: Anterior Nasal Swab  Result Value Ref Range Status   SARS Coronavirus 2 by RT PCR NEGATIVE NEGATIVE Final    Comment: (NOTE) SARS-CoV-2 target nucleic acids are NOT DETECTED.  The SARS-CoV-2 RNA is generally detectable in upper and lower respiratory specimens during the acute phase of infection. The lowest concentration of SARS-CoV-2 viral copies this assay can detect is 250 copies / mL. A negative result does not preclude SARS-CoV-2 infection and should not be used as the sole basis for treatment or other patient management decisions.  A negative result may occur with improper specimen collection / handling, submission of specimen other than nasopharyngeal swab, presence of viral mutation(s) within the areas targeted by this assay, and inadequate number of viral copies (<250 copies / mL). A negative result must be combined with clinical observations, patient history, and epidemiological information.  Fact Sheet for Patients:   RoadLapTop.co.za  Fact Sheet for Healthcare Providers: http://kim-miller.com/  This test is not yet approved or  cleared by the [place] FDA and has been authorized for  detection and/or diagnosis of SARS-CoV-2 by FDA under an Emergency Use Authorization (EUA).  This EUA will remain in effect (meaning this test can be used) for the duration of the COVID-19 declaration under Section 564(b)(1) of the Act, 21 U.S.C. section 360bbb-3(b)(1), unless the authorization is terminated or revoked sooner.  Performed at [facility], [address] [place], [place] [address]   Blood Culture (  routine x 2)     Status: None (Preliminary result)   Collection Time: 03/27/23  3:46 PM   Specimen: BLOOD  Result Value Ref Range Status   Specimen Description BLOOD RIGHT ANTECUBITAL  Final   Special Requests   Final    BOTTLES DRAWN AEROBIC AND ANAEROBIC Blood Culture adequate volume   Culture   Final    NO GROWTH 3 DAYS Performed at [facility], [address], [place], [place] [address]    Report Status PENDING  Incomplete  Blood Culture (routine x 2)     Status: None (Preliminary result)   Collection Time: 03/27/23  4:28 PM   Specimen: Left Antecubital; Blood  Result Value Ref Range Status   Specimen Description LEFT ANTECUBITAL BLOOD  Final   Special Requests   Final    BOTTLES DRAWN AEROBIC AND ANAEROBIC Blood Culture adequate volume   Culture   Final    NO GROWTH 3 DAYS Performed at [facility], [address] [place], [place] [address]    Report Status PENDING  Incomplete  MRSA Next Gen by PCR, Nasal     Status: None   Collection Time: 03/27/23 11:15 PM   Specimen: Nasal Mucosa; Nasal Swab  Result Value Ref Range Status   MRSA by PCR Next Gen NOT DETECTED NOT DETECTED Final    Comment: (NOTE) The GeneXpert MRSA Assay (FDA approved for NASAL specimens only), is one component of a comprehensive MRSA colonization surveillance program. It is not intended to diagnose MRSA infection nor to guide or monitor treatment for MRSA infections. Test performance is not FDA approved in patients less than 88 years old. Performed at [facility], [address] [place], [place] [address]      Labs: BNP (last 3 results) No results for input(s): "BNP" in the last 8760 hours. Basic Metabolic Panel: Recent Labs  Lab 03/27/23 1548 03/28/23 0240 03/28/23 1014 03/29/23 0233 03/30/23 0521  NA 134* 138  --  137 139  K 4.9 4.3  --  3.6 3.2*  CL 93* 99  --  104 105  CO2 22 24  --  22 27  GLUCOSE 111* 91  --  71 82  BUN 53* 48*  --  29* 19  CREATININE 3.66* 2.45*  --  1.15* 0.96  CALCIUM 6.8* 6.7* 7.5* 7.4* 8.2*  MG  --  1.0*  --  1.8 1.3*  PHOS  --  4.4  --  2.0* 2.7   Liver Function Tests: Recent Labs  Lab 03/27/23 1548 03/29/23 0233 03/30/23 0521  AST 26  --   --   ALT 21  --   --   ALKPHOS 101  --   --   BILITOT 0.8  --   --   PROT 5.8*  --   --   ALBUMIN 2.6* 1.6* 1.8*   Recent Labs  Lab 03/27/23 1548 03/28/23 0240  LIPASE 18 17   No results for input(s): "AMMONIA" in the last 168 hours. CBC: Recent Labs  Lab 03/27/23 1628 03/28/23 0240  WBC 16.2* 14.2*  NEUTROABS 14.6*  --   HGB 12.1 11.4*  HCT 37.0 35.0*  MCV 100.0 98.3  PLT 278 241   Cardiac Enzymes: No results for input(s): "CKTOTAL", "CKMB", "CKMBINDEX", "TROPONINI" in the last 168 hours. BNP: Invalid input(s): "POCBNP" CBG: Recent Labs  Lab 03/29/23 2001 03/30/23 0114 03/30/23 0143 03/30/23 0457 03/30/23 0825  GLUCAP 210* 54* 105* 96 95   D-Dimer No results  for input(s): "DDIMER" in the last 72 hours. Hgb A1c Recent Labs    03/28/23 0240  HGBA1C 6.7*   Lipid Profile No results for input(s): "CHOL", "HDL", "LDLCALC", "TRIG", "CHOLHDL", "LDLDIRECT" in the last 72 hours. Thyroid function studies No results for input(s): "TSH", "T4TOTAL", "T3FREE", "THYROIDAB" in the last 72 hours.  Invalid input(s): "FREET3" Anemia work up No results for input(s): "VITAMINB12", "FOLATE", "FERRITIN", "TIBC", "IRON", "RETICCTPCT" in the last 72 hours. Urinalysis    Component Value Date/Time   COLORURINE YELLOW 03/27/2023 1855   APPEARANCEUR HAZY (A)  03/27/2023 1855   LABSPEC 1.012 03/27/2023 1855   PHURINE 5.0 03/27/2023 1855   GLUCOSEU NEGATIVE 03/27/2023 1855   HGBUR NEGATIVE 03/27/2023 1855   BILIRUBINUR NEGATIVE 03/27/2023 1855   BILIRUBINUR negative 10/09/2019 1342   KETONESUR NEGATIVE 03/27/2023 1855   PROTEINUR NEGATIVE 03/27/2023 1855   UROBILINOGEN 0.2 10/09/2019 1342   UROBILINOGEN 0.2 12/06/2014 1819   NITRITE NEGATIVE 03/27/2023 1855   LEUKOCYTESUR SMALL (A) 03/27/2023 1855   Sepsis Labs Recent Labs  Lab 03/27/23 1628 03/28/23 0240  WBC 16.2* 14.2*   Microbiology Recent Results (from the past 240 hour(s))  SARS Coronavirus 2 by RT PCR (hospital order, performed in [facility] hospital lab) *cepheid single result test* Anterior Nasal Swab     Status: None   Collection Time: 03/27/23  3:33 PM   Specimen: Anterior Nasal Swab  Result Value Ref Range Status   SARS Coronavirus 2 by RT PCR NEGATIVE NEGATIVE Final    Comment: (NOTE) SARS-CoV-2 target nucleic acids are NOT DETECTED.  The SARS-CoV-2 RNA is generally detectable in upper and lower respiratory specimens during the acute phase of infection. The lowest concentration of SARS-CoV-2 viral copies this assay can detect is 250 copies / mL. A negative result does not preclude SARS-CoV-2 infection and should not be used as the sole basis for treatment or other patient management decisions.  A negative result may occur with improper specimen collection / handling, submission of specimen other than nasopharyngeal swab, presence of viral mutation(s) within the areas targeted by this assay, and inadequate number of viral copies (<250 copies / mL). A negative result must be combined with clinical observations, patient history, and epidemiological information.  Fact Sheet for Patients:   RoadLapTop.co.za  Fact Sheet for Healthcare Providers: http://kim-miller.com/  This test is not yet approved or  cleared by the [place] FDA and has been authorized for detection and/or diagnosis of SARS-CoV-2 by FDA under an Emergency Use Authorization (EUA).  This EUA will remain in effect (meaning this test can be used) for the duration of the COVID-19 declaration under Section 564(b)(1) of the Act, 21 U.S.C. section 360bbb-3(b)(1), unless the authorization is terminated or revoked sooner.  Performed at [facility], [address], [place], [place] [address]   Blood Culture (routine x 2)     Status: None (Preliminary result)   Collection Time: 03/27/23  3:46 PM   Specimen: BLOOD  Result Value Ref Range Status   Specimen Description BLOOD RIGHT ANTECUBITAL  Final   Special Requests   Final    BOTTLES DRAWN AEROBIC AND ANAEROBIC Blood Culture adequate volume   Culture   Final    NO GROWTH 3 DAYS Performed at [facility], [address], [place], [place] [address]    Report Status PENDING  Incomplete  Blood Culture (routine x 2)     Status: None (Preliminary result)   Collection Time: 03/27/23  4:28 PM   Specimen: Left Antecubital;  Blood  Result Value Ref Range Status   Specimen Description LEFT ANTECUBITAL BLOOD  Final   Special Requests   Final    BOTTLES DRAWN AEROBIC AND ANAEROBIC Blood Culture adequate volume   Culture   Final    NO GROWTH 3 DAYS Performed at [facility], [address] [place], [place] [address]    Report Status PENDING  Incomplete  MRSA Next Gen by PCR, Nasal     Status: None   Collection Time: 03/27/23 11:15 PM   Specimen: Nasal Mucosa; Nasal Swab  Result Value Ref Range Status   MRSA by PCR Next Gen NOT DETECTED NOT DETECTED Final    Comment: (NOTE) The GeneXpert MRSA Assay (FDA approved for NASAL specimens only), is one component of a comprehensive MRSA colonization surveillance program. It is not intended to diagnose MRSA infection nor to guide or monitor treatment for MRSA infections. Test performance is not FDA approved in patients less than 49 years old. Performed at  [facility], [address], [place], [place] [address]     FURTHER DISCHARGE INSTRUCTIONS:   Get Medicines reviewed and adjusted: Please take all your medications with you for your next visit with your Primary MD   Laboratory/radiological data: Please request your Primary MD to go over all hospital tests and procedure/radiological results at the follow up, please ask your Primary MD to get all Hospital records sent to his/her office.   In some cases, they will be blood work, cultures and biopsy results pending at the time of your discharge. Please request that your primary care M.D. goes through all the records of your hospital data and follows up on these results.   Also Note the following: If you experience worsening of your admission symptoms, develop shortness of breath, life threatening emergency, suicidal or homicidal thoughts you must seek medical attention immediately by calling 911 or calling your MD immediately  if symptoms less severe.   You must read complete instructions/literature along with all the possible adverse reactions/side effects for all the Medicines you take and that have been prescribed to you. Take any new Medicines after you have completely understood and accpet all the possible adverse reactions/side effects.    Do not drive when taking Pain medications or sleeping medications (Benzodaizepines)   Do not take more than prescribed Pain, Sleep and Anxiety Medications. It is not advisable to combine anxiety,sleep and pain medications without talking with your primary care practitioner   Special Instructions: If you have smoked or chewed Tobacco  in the last 2 yrs please stop smoking, stop any regular Alcohol  and or any Recreational drug use.   Wear Seat belts while driving.   Please note: You were cared for by a hospitalist during your hospital stay. Once you are discharged, your primary care physician will handle any further medical issues. Please note  that NO REFILLS for any discharge medications will be authorized once you are discharged, as it is imperative that you return to your primary care physician (or establish a relationship with a primary care physician if you do not have one) for your post hospital discharge needs so that they can reassess your need for medications and monitor your lab values  Time coordinating discharge: Over 30 minutes  SIGNED:   Hughie Closs, MD  [facility] 03/30/2023, 9:38 AM *Please note that this is a verbal dictation therefore any spelling or grammatical errors are due to the "Dragon [employer] One" system interpretation. If 7PM-7AM, please contact  night-coverage [email or web address]

## 2023-03-30 NOTE — Plan of Care (Signed)

## 2023-04-01 LAB — CULTURE, BLOOD (ROUTINE X 2)
Culture: NO GROWTH
Culture: NO GROWTH
Special Requests: ADEQUATE
Special Requests: ADEQUATE

## 2023-04-05 ENCOUNTER — Other Ambulatory Visit: Payer: Self-pay

## 2023-04-05 ENCOUNTER — Observation Stay (HOSPITAL_COMMUNITY): Payer: Medicare Other

## 2023-04-05 ENCOUNTER — Encounter (HOSPITAL_COMMUNITY): Payer: Self-pay | Admitting: Emergency Medicine

## 2023-04-05 ENCOUNTER — Emergency Department (HOSPITAL_COMMUNITY): Payer: Medicare Other

## 2023-04-05 ENCOUNTER — Inpatient Hospital Stay (HOSPITAL_COMMUNITY)
Admission: EM | Admit: 2023-04-05 | Discharge: 2023-04-07 | DRG: 638 | Disposition: A | Discharge status: Home / Self Care | Payer: Medicare Other | Attending: Family Medicine | Admitting: Family Medicine

## 2023-04-05 DIAGNOSIS — Z886 Allergy status to analgesic agent status: Secondary | ICD-10-CM

## 2023-04-05 DIAGNOSIS — L03116 Cellulitis of left lower limb: Secondary | ICD-10-CM | POA: Diagnosis present

## 2023-04-05 DIAGNOSIS — R062 Wheezing: Secondary | ICD-10-CM

## 2023-04-05 DIAGNOSIS — E785 Hyperlipidemia, unspecified: Secondary | ICD-10-CM | POA: Diagnosis present

## 2023-04-05 DIAGNOSIS — M795 Residual foreign body in soft tissue: Secondary | ICD-10-CM | POA: Diagnosis present

## 2023-04-05 DIAGNOSIS — Z825 Family history of asthma and other chronic lower respiratory diseases: Secondary | ICD-10-CM

## 2023-04-05 DIAGNOSIS — Z8249 Family history of ischemic heart disease and other diseases of the circulatory system: Secondary | ICD-10-CM

## 2023-04-05 DIAGNOSIS — E11621 Type 2 diabetes mellitus with foot ulcer: Principal | ICD-10-CM | POA: Diagnosis present

## 2023-04-05 DIAGNOSIS — E1140 Type 2 diabetes mellitus with diabetic neuropathy, unspecified: Secondary | ICD-10-CM | POA: Diagnosis present

## 2023-04-05 DIAGNOSIS — J441 Chronic obstructive pulmonary disease with (acute) exacerbation: Secondary | ICD-10-CM | POA: Diagnosis present

## 2023-04-05 DIAGNOSIS — Z833 Family history of diabetes mellitus: Secondary | ICD-10-CM

## 2023-04-05 DIAGNOSIS — K8681 Exocrine pancreatic insufficiency: Secondary | ICD-10-CM | POA: Diagnosis present

## 2023-04-05 DIAGNOSIS — E118 Type 2 diabetes mellitus with unspecified complications: Secondary | ICD-10-CM | POA: Diagnosis not present

## 2023-04-05 DIAGNOSIS — S90821A Blister (nonthermal), right foot, initial encounter: Secondary | ICD-10-CM | POA: Diagnosis present

## 2023-04-05 DIAGNOSIS — L97429 Non-pressure chronic ulcer of left heel and midfoot with unspecified severity: Secondary | ICD-10-CM | POA: Diagnosis present

## 2023-04-05 DIAGNOSIS — F419 Anxiety disorder, unspecified: Secondary | ICD-10-CM | POA: Diagnosis present

## 2023-04-05 DIAGNOSIS — Z888 Allergy status to other drugs, medicaments and biological substances status: Secondary | ICD-10-CM

## 2023-04-05 DIAGNOSIS — Z23 Encounter for immunization: Secondary | ICD-10-CM

## 2023-04-05 DIAGNOSIS — E872 Acidosis, unspecified: Secondary | ICD-10-CM | POA: Diagnosis present

## 2023-04-05 DIAGNOSIS — Z882 Allergy status to sulfonamides status: Secondary | ICD-10-CM

## 2023-04-05 DIAGNOSIS — Z7984 Long term (current) use of oral hypoglycemic drugs: Secondary | ICD-10-CM

## 2023-04-05 DIAGNOSIS — Z720 Tobacco use: Secondary | ICD-10-CM | POA: Diagnosis present

## 2023-04-05 DIAGNOSIS — Z880 Allergy status to penicillin: Secondary | ICD-10-CM

## 2023-04-05 DIAGNOSIS — Z90710 Acquired absence of both cervix and uterus: Secondary | ICD-10-CM

## 2023-04-05 DIAGNOSIS — N179 Acute kidney failure, unspecified: Secondary | ICD-10-CM | POA: Diagnosis present

## 2023-04-05 DIAGNOSIS — Z79899 Other long term (current) drug therapy: Secondary | ICD-10-CM

## 2023-04-05 DIAGNOSIS — I10 Essential (primary) hypertension: Secondary | ICD-10-CM | POA: Diagnosis present

## 2023-04-05 DIAGNOSIS — Z79890 Hormone replacement therapy (postmenopausal): Secondary | ICD-10-CM

## 2023-04-05 DIAGNOSIS — E860 Dehydration: Secondary | ICD-10-CM | POA: Diagnosis present

## 2023-04-05 DIAGNOSIS — S90454A Superficial foreign body, right lesser toe(s), initial encounter: Secondary | ICD-10-CM

## 2023-04-05 DIAGNOSIS — F319 Bipolar disorder, unspecified: Secondary | ICD-10-CM | POA: Diagnosis present

## 2023-04-05 DIAGNOSIS — S90822A Blister (nonthermal), left foot, initial encounter: Secondary | ICD-10-CM | POA: Diagnosis present

## 2023-04-05 DIAGNOSIS — E039 Hypothyroidism, unspecified: Secondary | ICD-10-CM | POA: Diagnosis present

## 2023-04-05 DIAGNOSIS — Z1152 Encounter for screening for COVID-19: Secondary | ICD-10-CM

## 2023-04-05 DIAGNOSIS — Z7951 Long term (current) use of inhaled steroids: Secondary | ICD-10-CM

## 2023-04-05 DIAGNOSIS — E876 Hypokalemia: Secondary | ICD-10-CM | POA: Diagnosis present

## 2023-04-05 DIAGNOSIS — F17200 Nicotine dependence, unspecified, uncomplicated: Secondary | ICD-10-CM | POA: Diagnosis present

## 2023-04-05 DIAGNOSIS — E119 Type 2 diabetes mellitus without complications: Secondary | ICD-10-CM

## 2023-04-05 DIAGNOSIS — K861 Other chronic pancreatitis: Secondary | ICD-10-CM | POA: Diagnosis present

## 2023-04-05 DIAGNOSIS — Z9049 Acquired absence of other specified parts of digestive tract: Secondary | ICD-10-CM

## 2023-04-05 DIAGNOSIS — F17210 Nicotine dependence, cigarettes, uncomplicated: Secondary | ICD-10-CM | POA: Diagnosis present

## 2023-04-05 DIAGNOSIS — J449 Chronic obstructive pulmonary disease, unspecified: Secondary | ICD-10-CM | POA: Diagnosis present

## 2023-04-05 LAB — COMPREHENSIVE METABOLIC PANEL
ALT: 22 U/L (ref 0–44)
AST: 20 U/L (ref 15–41)
Albumin: 3.1 g/dL — ABNORMAL LOW (ref 3.5–5.0)
Alkaline Phosphatase: 114 U/L (ref 38–126)
Anion gap: 10 (ref 5–15)
BUN: 27 mg/dL — ABNORMAL HIGH (ref 6–20)
CO2: 27 mmol/L (ref 22–32)
Calcium: 7.9 mg/dL — ABNORMAL LOW (ref 8.9–10.3)
Chloride: 97 mmol/L — ABNORMAL LOW (ref 98–111)
Creatinine, Ser: 1.36 mg/dL — ABNORMAL HIGH (ref 0.44–1.00)
GFR, Estimated: 46 mL/min — ABNORMAL LOW (ref >60)
Glucose, Bld: 152 mg/dL — ABNORMAL HIGH (ref 70–99)
Potassium: 3.7 mmol/L (ref 3.5–5.1)
Sodium: 134 mmol/L — ABNORMAL LOW (ref 135–145)
Total Bilirubin: 0.5 mg/dL (ref 0.3–1.2)
Total Protein: 6.9 g/dL (ref 6.5–8.1)

## 2023-04-05 LAB — CBC WITH DIFFERENTIAL/PLATELET
Abs Immature Granulocytes: 0.09 10*3/uL — ABNORMAL HIGH (ref 0.00–0.07)
Basophils Absolute: 0 10*3/uL (ref 0.0–0.1)
Basophils Relative: 0 %
Eosinophils Absolute: 0.4 10*3/uL (ref 0.0–0.5)
Eosinophils Relative: 3 %
HCT: 38.6 % (ref 36.0–46.0)
Hemoglobin: 12.3 g/dL (ref 12.0–15.0)
Immature Granulocytes: 1 %
Lymphocytes Relative: 23 %
Lymphs Abs: 2.4 10*3/uL (ref 0.7–4.0)
MCH: 32.2 pg (ref 26.0–34.0)
MCHC: 31.9 g/dL (ref 30.0–36.0)
MCV: 101 fL — ABNORMAL HIGH (ref 80.0–100.0)
Monocytes Absolute: 0.6 10*3/uL (ref 0.1–1.0)
Monocytes Relative: 6 %
Neutro Abs: 7 10*3/uL (ref 1.7–7.7)
Neutrophils Relative %: 67 %
Platelets: 327 10*3/uL (ref 150–400)
RBC: 3.82 MIL/uL — ABNORMAL LOW (ref 3.87–5.11)
RDW: 13.3 % (ref 11.5–15.5)
WBC: 10.6 10*3/uL — ABNORMAL HIGH (ref 4.0–10.5)
nRBC: 0 % (ref 0.0–0.2)

## 2023-04-05 LAB — CBG MONITORING, ED: Glucose-Capillary: 125 mg/dL — ABNORMAL HIGH (ref 70–99)

## 2023-04-05 LAB — LACTIC ACID, PLASMA
Lactic Acid, Venous: 2.9 mmol/L (ref 0.5–1.9)
Lactic Acid, Venous: 3.3 mmol/L (ref 0.5–1.9)

## 2023-04-05 LAB — SEDIMENTATION RATE: Sed Rate: 40 mm/h — ABNORMAL HIGH (ref 0–22)

## 2023-04-05 LAB — GLUCOSE, CAPILLARY: Glucose-Capillary: 196 mg/dL — ABNORMAL HIGH (ref 70–99)

## 2023-04-05 LAB — PROCALCITONIN: Procalcitonin: <0.1 ng/mL

## 2023-04-05 MED ORDER — NICOTINE 21 MG/24HR TD PT24
21.0000 mg | MEDICATED_PATCH | Freq: Every day | TRANSDERMAL | Status: DC
  Administered 2023-04-06 – 2023-04-07 (×3): 21 mg via TRANSDERMAL
  Filled 2023-04-05 (×3): qty 1

## 2023-04-05 MED ORDER — ALBUTEROL SULFATE (2.5 MG/3ML) 0.083% IN NEBU: 2.5000 mg | INHALATION_SOLUTION | RESPIRATORY_TRACT | Status: DC | PRN

## 2023-04-05 MED ORDER — GABAPENTIN 300 MG PO CAPS
300.0000 mg | ORAL_CAPSULE | Freq: Three times a day (TID) | ORAL | Status: DC
  Administered 2023-04-06 – 2023-04-07 (×5): 300 mg via ORAL
  Filled 2023-04-05 (×5): qty 1

## 2023-04-05 MED ORDER — VANCOMYCIN HCL 750 MG/150ML IV SOLN: 750.0000 mg | INTRAVENOUS | Status: DC

## 2023-04-05 MED ORDER — HEPARIN SODIUM (PORCINE) 5000 UNIT/ML IJ SOLN
5000.0000 [IU] | Freq: Three times a day (TID) | INTRAMUSCULAR | Status: DC
  Administered 2023-04-06 – 2023-04-07 (×5): 5000 [IU] via SUBCUTANEOUS
  Filled 2023-04-05 (×5): qty 1

## 2023-04-05 MED ORDER — TETANUS-DIPHTH-ACELL PERTUSSIS 5-2.5-18.5 LF-MCG/0.5 IM SUSY
0.5000 mL | PREFILLED_SYRINGE | Freq: Once | INTRAMUSCULAR | Status: AC
  Administered 2023-04-07: 0.5 mL via INTRAMUSCULAR
  Filled 2023-04-05: qty 0.5

## 2023-04-05 MED ORDER — MOMETASONE FURO-FORMOTEROL FUM 100-5 MCG/ACT IN AERO
2.0000 | INHALATION_SPRAY | Freq: Two times a day (BID) | RESPIRATORY_TRACT | Status: DC
  Administered 2023-04-06 – 2023-04-07 (×3): 2 via RESPIRATORY_TRACT
  Filled 2023-04-05: qty 8.8

## 2023-04-05 MED ORDER — ATORVASTATIN CALCIUM 10 MG PO TABS
20.0000 mg | ORAL_TABLET | Freq: Every day | ORAL | Status: DC
  Administered 2023-04-06 – 2023-04-07 (×2): 20 mg via ORAL
  Filled 2023-04-05 (×2): qty 2

## 2023-04-05 MED ORDER — SODIUM CHLORIDE 0.9 % IV BOLUS
1000.0000 mL | Freq: Once | INTRAVENOUS | Status: AC
  Administered 2023-04-05: 1000 mL via INTRAVENOUS

## 2023-04-05 MED ORDER — SODIUM CHLORIDE 0.9 % IV SOLN
2.0000 g | INTRAVENOUS | Status: DC
  Administered 2023-04-05: 2 g via INTRAVENOUS
  Filled 2023-04-05: qty 12.5

## 2023-04-05 MED ORDER — ALBUTEROL SULFATE HFA 108 (90 BASE) MCG/ACT IN AERS
2.0000 | INHALATION_SPRAY | Freq: Once | RESPIRATORY_TRACT | Status: AC
  Administered 2023-04-05: 2 via RESPIRATORY_TRACT
  Filled 2023-04-05: qty 6.7

## 2023-04-05 MED ORDER — ONDANSETRON HCL 4 MG/2ML IJ SOLN: 4.0000 mg | Freq: Four times a day (QID) | INTRAMUSCULAR | Status: DC | PRN

## 2023-04-05 MED ORDER — OXYCODONE HCL 5 MG PO TABS
5.0000 mg | ORAL_TABLET | ORAL | Status: DC | PRN
  Administered 2023-04-06 – 2023-04-07 (×3): 5 mg via ORAL
  Filled 2023-04-05 (×3): qty 1

## 2023-04-05 MED ORDER — MORPHINE SULFATE (PF) 2 MG/ML IV SOLN
2.0000 mg | INTRAVENOUS | Status: DC | PRN
  Administered 2023-04-06 – 2023-04-07 (×3): 2 mg via INTRAVENOUS
  Filled 2023-04-05 (×3): qty 1

## 2023-04-05 MED ORDER — ARIPIPRAZOLE 5 MG PO TABS
10.0000 mg | ORAL_TABLET | Freq: Every day | ORAL | Status: DC
  Administered 2023-04-06 – 2023-04-07 (×2): 10 mg via ORAL
  Filled 2023-04-05 (×2): qty 2

## 2023-04-05 MED ORDER — LEVOTHYROXINE SODIUM 50 MCG PO TABS
25.0000 ug | ORAL_TABLET | Freq: Every day | ORAL | Status: DC
  Administered 2023-04-06 – 2023-04-07 (×2): 25 ug via ORAL
  Filled 2023-04-05 (×2): qty 1

## 2023-04-05 MED ORDER — ONDANSETRON HCL 4 MG PO TABS
4.0000 mg | ORAL_TABLET | Freq: Four times a day (QID) | ORAL | Status: DC | PRN
  Administered 2023-04-06: 4 mg via ORAL
  Filled 2023-04-05: qty 1

## 2023-04-05 MED ORDER — MORPHINE SULFATE ER 15 MG PO TBCR
15.0000 mg | EXTENDED_RELEASE_TABLET | Freq: Two times a day (BID) | ORAL | Status: DC
  Administered 2023-04-06 – 2023-04-07 (×4): 15 mg via ORAL
  Filled 2023-04-05 (×5): qty 1

## 2023-04-05 MED ORDER — ACETAMINOPHEN 650 MG RE SUPP: 650.0000 mg | Freq: Four times a day (QID) | RECTAL | Status: DC | PRN

## 2023-04-05 MED ORDER — ACETAMINOPHEN 325 MG PO TABS: 650.0000 mg | ORAL_TABLET | Freq: Four times a day (QID) | ORAL | Status: DC | PRN

## 2023-04-05 MED ORDER — DULOXETINE HCL 30 MG PO CPEP
60.0000 mg | ORAL_CAPSULE | Freq: Every day | ORAL | Status: DC
  Administered 2023-04-06 – 2023-04-07 (×2): 60 mg via ORAL
  Filled 2023-04-05 (×2): qty 2

## 2023-04-05 MED ORDER — VANCOMYCIN HCL IN DEXTROSE 1-5 GM/200ML-% IV SOLN
1000.0000 mg | Freq: Once | INTRAVENOUS | Status: AC
  Administered 2023-04-05: 1000 mg via INTRAVENOUS
  Filled 2023-04-05: qty 200

## 2023-04-05 MED ORDER — AMITRIPTYLINE HCL 25 MG PO TABS
100.0000 mg | ORAL_TABLET | Freq: Every day | ORAL | Status: DC
  Administered 2023-04-06 (×2): 100 mg via ORAL
  Filled 2023-04-05 (×2): qty 4

## 2023-04-05 MED ORDER — BUSPIRONE HCL 5 MG PO TABS
15.0000 mg | ORAL_TABLET | Freq: Two times a day (BID) | ORAL | Status: DC
  Administered 2023-04-06 – 2023-04-07 (×4): 15 mg via ORAL
  Filled 2023-04-05 (×4): qty 3

## 2023-04-05 MED ORDER — MIDODRINE HCL 5 MG PO TABS
5.0000 mg | ORAL_TABLET | Freq: Three times a day (TID) | ORAL | Status: DC
  Administered 2023-04-06 – 2023-04-07 (×4): 5 mg via ORAL
  Filled 2023-04-05 (×4): qty 1

## 2023-04-05 MED ORDER — PANTOPRAZOLE SODIUM 40 MG PO TBEC
40.0000 mg | DELAYED_RELEASE_TABLET | Freq: Two times a day (BID) | ORAL | Status: DC
  Administered 2023-04-06 – 2023-04-07 (×4): 40 mg via ORAL
  Filled 2023-04-05 (×4): qty 1

## 2023-04-05 MED ORDER — INSULIN ASPART 100 UNIT/ML IJ SOLN: 0.0000 [IU] | Freq: Every day | INTRAMUSCULAR | Status: DC

## 2023-04-05 MED ORDER — INSULIN ASPART 100 UNIT/ML IJ SOLN
0.0000 [IU] | Freq: Three times a day (TID) | INTRAMUSCULAR | Status: DC
  Administered 2023-04-06 (×2): 3 [IU] via SUBCUTANEOUS

## 2023-04-05 NOTE — Assessment & Plan Note (Signed)
Continue statin. 

## 2023-04-05 NOTE — Assessment & Plan Note (Signed)
Continue Synthroid °

## 2023-04-05 NOTE — ED Provider Notes (Signed)
[facility] EMERGENCY DEPARTMENT AT [facility] Provider Note   CSN: [id number] Arrival date & time: 04/05/23  1405     History  Chief Complaint  Patient presents with   Foot Pain    Blisters to both feet.    [name] is a 52 y.o. female history of anemia, chronic pain recent discharge for septic shock thought likely due to cellulitis versus pneumonia here for evaluation of blistering wounds to bilateral feet.  Patient states today was the last day of her antibiotics after her recent discharge.  She is still on midodrine which she is tapering off of for her blood pressure.  Had some chills a few days ago.  History of diabetes, states she has severe neuropathy at baseline however she has severe pain to her left heel and shin.  States erythema to her left shin and her left heel have worsened since discharge.  Documented fever, chest pain, shortness of breath, cough, abdominal pain, back pain, weakness.  She has never had any like this previously.  She has an appointment with dermatology in a few weeks for evaluation of her skin wounds  HPI     Home Medications Prior to Admission medications   Medication Sig Start Date End Date Taking? Authorizing Provider  albuterol (PROVENTIL HFA;VENTOLIN HFA) 108 (90 BASE) MCG/ACT inhaler Inhale 2 puffs into the lungs daily as needed for wheezing or shortness of breath.    [provider]  albuterol (PROVENTIL) (2.5 MG/3ML) 0.083% nebulizer solution Take 2.5 mg by nebulization every 6 (six) hours as needed for wheezing or shortness of breath.  05/24/19   [provider]  amitriptyline (ELAVIL) 100 MG tablet Take 100 mg by mouth at bedtime.    [provider]  ARIPiprazole (ABILIFY) 10 MG tablet Take 10 mg by mouth daily. 03/22/23   [provider]  atorvastatin (LIPITOR) 20 MG tablet Take 20 mg by mouth daily.    [provider]  budesonide-formoterol (SYMBICORT) 80-4.5 MCG/ACT inhaler Inhale  2 puffs into the lungs 2 (two) times daily. Patient taking differently: Inhale 2 puffs into the lungs daily. 03/30/14   Standley Brooking, MD  busPIRone (BUSPAR) 15 MG tablet Take 15 mg by mouth 2 (two) times daily. 03/22/23   [provider]  cephALEXin (KEFLEX) 500 MG capsule Take 1 capsule (500 mg total) by mouth 3 (three) times daily for 6 days. 03/30/23 04/05/23  Hughie Closs, MD  DULoxetine (CYMBALTA) 60 MG capsule Take 60 mg by mouth daily. 06/24/22   [provider]  furosemide (LASIX) 20 MG tablet Take 1 tablet (20 mg total) by mouth daily. 04/01/23   Hughie Closs, MD  gabapentin (NEURONTIN) 300 MG capsule Take 300 mg by mouth 3 (three) times daily. 04/11/19   [provider]  levothyroxine (SYNTHROID, LEVOTHROID) 25 MCG tablet Take 25 mcg by mouth daily before breakfast.    [provider]  lisinopril (PRINIVIL,ZESTRIL) 2.5 MG tablet Take 2.5 mg by mouth daily.  01/18/18   [provider]  metFORMIN (GLUCOPHAGE) 500 MG tablet Take 2 tablets (1,000 mg total) by mouth 2 (two) times daily. 04/01/23   Hughie Closs, MD  methocarbamol (ROBAXIN) 500 MG tablet Take 1,000 mg by mouth in the morning and at bedtime.    [provider]  midodrine (PROAMATINE) 5 MG tablet Take 1 tablet (5 mg total) by mouth 3 (three) times daily with meals for 14 days, THEN 1 tablet (5 mg total) 2 (two) times daily  with a meal for 7 days, THEN 1 tablet (5 mg total) daily for 7 days. 03/30/23 04/27/23  Hughie Closs, MD  morphine (MS CONTIN) 15 MG 12 hr tablet Take 15 mg by mouth 2 (two) times daily. 03/08/23   [provider]  ondansetron (ZOFRAN) 4 MG tablet Take 4 mg by mouth every 8 (eight) hours as needed for vomiting or nausea. 06/02/19   [provider]  oxycodone (ROXICODONE) 30 MG immediate release tablet Take 30 mg by mouth 5 (five) times daily. 09/21/22   [provider]  Pancrelipase, Lip-Prot-Amyl, (ZENPEP) 20000-63000 units CPEP Take 2  capsules by mouth in the morning and at bedtime. 09/11/19   [provider]  pantoprazole (PROTONIX) 40 MG tablet Take 40 mg by mouth 2 (two) times daily.    [provider]  polyethylene glycol powder (GLYCOLAX/MIRALAX) powder Take 17 g by mouth 2 (two) times daily. Until daily soft stools  OTC Patient taking differently: Take 17 g by mouth as needed for mild constipation. Until daily soft stools  OTC 03/05/15   Barrett Henle, PA-C  valACYclovir (VALTREX) 500 MG tablet Take 500 mg by mouth daily.    [provider]      Allergies    Penicillins, Sulfa antibiotics, Aspirin, Enoxaparin, Prednisone, and Venofer [iron sucrose]    Review of Systems   Review of Systems  Constitutional:  Positive for chills. Negative for activity change, appetite change, diaphoresis, fatigue, fever and unexpected weight change.  HENT: Negative.    Respiratory: Negative.    Cardiovascular: Negative.   Gastrointestinal: Negative.   Genitourinary: Negative.   Musculoskeletal: Negative.   Skin:  Positive for wound.  All other systems reviewed and are negative.   Physical Exam Updated Vital Signs BP 125/70   Pulse 90   Temp 98 F (36.7 C) (Oral)   Resp 15   Ht 5\' 2"  (1.575 m)   Wt 36.2 kg   SpO2 100%   BMI 14.60 kg/m  Physical Exam Vitals and nursing note reviewed.  Constitutional:      General: She is not in acute distress.    Appearance: She is well-developed. She is not ill-appearing, toxic-appearing or diaphoretic.  HENT:     Head: Normocephalic and atraumatic.     Nose: Nose normal.     Mouth/Throat:     Mouth: Mucous membranes are moist.  Eyes:     Pupils: Pupils are equal, round, and reactive to light.  Cardiovascular:     Rate and Rhythm: Normal rate and regular rhythm.     Pulses:          Radial pulses are 2+ on the right side and 2+ on the left side.       Dorsalis pedis pulses are 1+ on the right side and 1+ on the left side.     Heart  sounds: Normal heart sounds.  Pulmonary:     Effort: Pulmonary effort is normal. No respiratory distress.     Breath sounds: Normal breath sounds.  Abdominal:     General: Bowel sounds are normal. There is no distension.     Palpations: Abdomen is soft.     Tenderness: There is no abdominal tenderness. There is no guarding or rebound.  Musculoskeletal:        General: Normal range of motion.     Cervical back: Normal range of motion and neck supple.     Comments: Diffuse tenderness anterior left shin.  Trace pitting edema  bilateral lower extremities left greater than right.  Erythema anterior left shin.  Flaccid bulla dorsum left foot ulceration left heel, macerated tissue over calcaneus with surrounding erythema, scant discharge.  Right foot with large bulla lateral aspect foot as well as fifth metatarsal.  Compartments soft, nontender posterior calves bilaterally, full range of motion extremities without difficulty.  Skin:    General: Skin is warm and dry.     Capillary Refill: Capillary refill takes less than 2 seconds.     Comments: See picture in chart.  Anterior left shin with redness, warmth, tenderness.  Neurological:     General: No focal deficit present.     Mental Status: She is alert and oriented to person, place, and time.     Cranial Nerves: Cranial nerves 2-12 are intact.     Sensory: Sensation is intact.     Motor: Motor function is intact.     Gait: Gait is intact.     Comments: Ambulatory           ED Results / Procedures / Treatments   Labs (all labs ordered are listed, but only abnormal results are displayed) Labs Reviewed  CBC WITH DIFFERENTIAL/PLATELET - Abnormal; Notable for the following components:      Result Value   WBC 10.6 (*)    RBC 3.82 (*)    MCV 101.0 (*)    Abs Immature Granulocytes 0.09 (*)    All other components within normal limits  COMPREHENSIVE METABOLIC PANEL - Abnormal; Notable for the following components:   Sodium 134 (*)     Chloride 97 (*)    Glucose, Bld 152 (*)    BUN 27 (*)    Creatinine, Ser 1.36 (*)    Calcium 7.9 (*)    Albumin 3.1 (*)    GFR, Estimated 46 (*)    All other components within normal limits  CBG MONITORING, ED - Abnormal; Notable for the following components:   Glucose-Capillary 125 (*)    All other components within normal limits  CULTURE, BLOOD (ROUTINE X 2)  CULTURE, BLOOD (ROUTINE X 2)  SEDIMENTATION RATE  C-REACTIVE PROTEIN  LACTIC ACID, PLASMA  LACTIC ACID, PLASMA    EKG None  Radiology No results found.  Procedures Procedures    Medications Ordered in ED Medications  vancomycin (VANCOCIN) IVPB 1000 mg/200 mL premix (has no administration in time range)    Followed by  vancomycin (VANCOREADY) IVPB 750 mg/150 mL (has no administration in time range)  ceFEPIme (MAXIPIME) 2 g in sodium chloride 0.9 % 100 mL IVPB (has no administration in time range)  sodium chloride 0.9 % bolus 1,000 mL (1,000 mLs Intravenous New Bag/Given 04/05/23 1745)  albuterol (VENTOLIN HFA) 108 (90 Base) MCG/ACT inhaler 2 puff (2 puffs Inhalation Given 04/05/23 1850)    ED Course/ Medical Decision Making/ A&P    42 old chronically ill patient with recent hospital admission for septic shock requiring pressors due to possible pneumonia versus cellulitis to left lower extremity comes in with worsening cellulitis and wounds to her bilateral feet.  Chronic neuropathy.  He said chills that documented fever.  Today was her last day of doxycycline.  She is still on midodrine for blood pressure control after discharge from the hospital.  Left shin with erythema, warmth and exquisite tenderness as well as left calcaneal wound with scant discharge.  She has bulla bilateral feet, right greater than left.  She denies any recent falls or injuries.  On arrival she is afebrile  however she is mildly tachycardic.  She is neurovascularly intact.  Heart and lungs are clear.  Abdomen soft, nontender.  Will plan on imaging,  labs and reassess.  Will give antibiotics for cellulitis.  Labs and imaging personally viewed and interpreted:  CBC leukocytosis 10.6 Metabolic panel sodium 134, creatinine 1.36   Called to bedside as patient states that she has developed a cough and a wheeze.  She sounds clear bilaterally however states she feels tight and needs albuterol.  Will oblige.  Will also get chest x-ray given her possible recent pneumonia as well.  Care transferred to oncoming provider who will follow-up on labs and imaging.  Disposition pending suspect will need admission given high risk with recent septic shock for similar cellulitis.                                  Medical Decision Making Amount and/or Complexity of Data Reviewed External Data Reviewed: labs, radiology and notes. Labs: ordered. Decision-making details documented in ED Course. Radiology: ordered and independent interpretation performed. Decision-making details documented in ED Course. ECG/medicine tests: ordered and independent interpretation performed. Decision-making details documented in ED Course.  Risk Prescription drug management.          Final Clinical Impression(s) / ED Diagnoses Final diagnoses:  Cellulitis of left lower extremity  Wheeze    Rx / DC Orders ED Discharge Orders     None         Reinhardt Licausi A, PA-C 04/05/23 1857    Anders Simmonds T, DO 04/06/23 1547

## 2023-04-05 NOTE — Assessment & Plan Note (Signed)
-   When last admitted was hypotensive - Was discharged on midodrine - Continue midodrine - No antihypertensives at this time

## 2023-04-05 NOTE — ED Triage Notes (Signed)
Pt presents with bilateral heel and toe blisters, recent discharged for [facility] Sepsis and pneumonia, currently on antibiotics, history of diabetes.

## 2023-04-05 NOTE — Assessment & Plan Note (Signed)
-   Smokes a pack per day - Counseled on importance of cessation - Nicotine patch ordered

## 2023-04-05 NOTE — Progress Notes (Signed)
Pharmacy Antibiotic Note  [name] is a 52 y.o. female admitted on 04/05/2023 with cellulitis.  Patient has bilateral heel and toe blisters with a PMH significant for diabetes .  Patient was recently discharged from [facility] post sepsis workup with questionable GI vs cellulitis source. Pharmacy has been consulted for Vancomycin and Cefepime dosing.  Patient's WBC is elevated at 10.6k but remains afebrile. Scr 1.36 this admission. Patient was on Keflex 500mg  TID prior to admission.    Plan: Give Vancomycin 1000 mg IV x 1  Start Vancomycin 750mg  q48 hours (eAUC 536, TBW, Scr 1.36, Vd 0.72)  Give cefepime 2g q24 hours - follow up to inc if renal function improves Follow up signs and symptoms of infection and ability to narrow  Follow up cultures   Height: 5\' 2"  (157.5 cm) Weight: 36.2 kg (79 lb 12.9 oz) IBW/kg (Calculated) : 50.1  Temp (24hrs), Avg:98 F (36.7 C), Min:98 F (36.7 C), Max:98 F (36.7 C)  Recent Labs  Lab 03/30/23 0521 04/05/23 1705  WBC  --  10.6*  CREATININE 0.96  --     Estimated Creatinine Clearance: 38.3 mL/min (by C-G formula based on SCr of 0.96 mg/dL).    Allergies  Allergen Reactions   Penicillins Shortness Of Breath     Tolerated ceftriaxone 03/27/23   Sulfa Antibiotics Shortness Of Breath   Aspirin Nausea And Vomiting   Enoxaparin Other (See Comments)    Has been known to cause her blood clots in her legs   Prednisone Other (See Comments)    Pancreatitis, but has to take for asthma sometimes   Venofer [Iron Sucrose] Nausea Only and Swelling    Antimicrobials this admission: Vanc 9/9 >>  Zosyn 9/9 >>   Dose adjustments this admission: N/A  Microbiology results:  9/9 BCx: sent  UCx:    Sputum:    MRSA PCR:   Thank you for allowing pharmacy to be a part of this patient's care.  Blane Ohara, PharmD  PGY2 Pharmacy Resident

## 2023-04-05 NOTE — Assessment & Plan Note (Signed)
-   Lactic acid 2.9 - Likely related to dehydration - No leukocytosis, or hypotension - Slightly tachycardic at arrival to 104 - 1 L bolus given in the ED - Trend lactic acid - Continue to monitor

## 2023-04-05 NOTE — H&P (Signed)
History and Physical    Patient: [name] HQI:[id number] DOB: 06/11/1971 DOA: 04/05/2023 DOS: the patient was seen and examined on 04/05/2023 PCP: Elfredia Nevins, MD  Patient coming from: Home  Chief Complaint:  Chief Complaint  Patient presents with   Foot Pain    Blisters to both feet.   HPI: [name] is a 52 y.o. female with medical history significant of anxiety, COPD, tobacco use disorder, depression, diabetes mellitus type 2, hyperlipidemia, hypothyroidism, and more presents to the ED with a chief complaint of blisters on her feet.  Of note patient was recently discharged on 3 September.  At that time she had combined septic and hypovolemic shock due to infected left heel wound and surrounding cellulitis.  She was discharged on midodrine.  It is noted the erythema was almost resolved at discharge.  She continued a course of Keflex at discharge.  That course finished today.  Patient reports worsening erythema on her left leg worsening pain in her right.  She also has new blisters on her right lateral aspect of her foot.  She reports that the blisters have been slowly leaking clear fluid.  She reports intact appetite, no measured fever.  She reports she has been staying chilled which is new for her.  Patient reports that she has noticed the erythema moving up her leg towards her knee.  This is on the left side.  She reports all the symptoms started 10 days ago, never really resolved, then started getting worse when she got out of the hospital.  Patient has no other complaints at this time.  Patient does smoke a pack per day.  She has been counseled on cessation.  She does not drink alcohol.  Patient is full code. Review of Systems: As mentioned in the history of present illness. All other systems reviewed and are negative. Past Medical History:  Diagnosis Date   Anemia    Anxiety    Asthma    Bipolar 1 disorder (HCC)    Chronic abdominal pain    COPD (chronic obstructive  pulmonary disease) (HCC)    Depression    Diabetes mellitus    Diabetic neuropathy (HCC) 10/29/2017   Esophagitis    Hiatal hernia    Hyperlipidemia 02/03/2012   Iron deficiency anemia due to chronic blood loss 01/29/2022   Migraine    Neck pain 06/08/2012   Pancreatitis chronic Idiopathic   Treated by Dr. Margaretha Glassing at [facility] in the past with celiac blocks.   Past Surgical History:  Procedure Laterality Date   ABDOMINAL HYSTERECTOMY     attempted colonoscopy  02/2017   Dr. Margaretha Glassing: Poor prep, scope passed to mid transverse colon before colonoscopy aborted.  No significant findings noted to mid transverse colon.  Next colonoscopy in 2 years.   CELIAC PLEXUS BLOCK     CHOLECYSTECTOMY     COLONOSCOPY N/A 12/05/2012   XLK:[id number] rectum, colon and terminal ileum   ESOPHAGOGASTRODUODENOSCOPY  02/20/2008   VOZ:[id number] distal esophageal mucosa, suspicious for neoplasm/Hiatal hernia otherwise normal    ESOPHAGOGASTRODUODENOSCOPY  04/03/2008   QQV:[id number] hiatal hernia, otherwise normal/Short, tight, benign-appearing peptic stricture   ESOPHAGOGASTRODUODENOSCOPY  November 16, 2012   Dr. Teena Dunk: hiatal hernia, esophagitis,    ESOPHAGOGASTRODUODENOSCOPY (EGD) WITH PROPOFOL N/A 05/09/2019   Dr. Darrick Penna: Large hiatal hernia with erythema and edema in the pouch, mild gastritis.  No biopsies taken.   ESOPHAGOGASTRODUODENOSCOPY (EGD) WITH PROPOFOL N/A 05/26/2019   Procedure: ESOPHAGOGASTRODUODENOSCOPY (EGD) WITH PROPOFOL;  Surgeon: Jonette Eva  L, MD;  Location: [facility];  Service: Endoscopy;  Laterality: N/A;   EUS  12/2018   Dr. Margaretha Glassing: LA grade B esophagitis, antritis but negative H. pylori, 1 cm ulcer at the duodenal sweep   GIVENS CAPSULE STUDY N/A 12/05/2012   Few superficial erosions but nothing found to explain iron deficiency anemia.    GIVENS CAPSULE STUDY N/A 05/26/2019   Procedure: GIVENS CAPSULE STUDY;  Surgeon: West Bali, MD;  Location: [facility];  Service:  Endoscopy;  Laterality: N/A;   Ileocolonoscopy  06/05/2008     NFA:[id number] anal canal, otherwise normal rectum, colon   TONSILLECTOMY     Social History:  reports that she has been smoking cigarettes. She has a 25 pack-year smoking history. She has been exposed to tobacco smoke. She has never used smokeless tobacco. She reports that she does not drink alcohol and does not use drugs.  Allergies  Allergen Reactions   Penicillins Shortness Of Breath     Tolerated ceftriaxone 03/27/23   Sulfa Antibiotics Shortness Of Breath   Aspirin Nausea And Vomiting   Enoxaparin Other (See Comments)    Has been known to cause her blood clots in her legs   Prednisone Other (See Comments)    Pancreatitis, but has to take for asthma sometimes   Venofer [Iron Sucrose] Nausea Only and Swelling    Family History  Problem Relation Age of Onset   Asthma Other    Asthma Paternal Grandfather    Heart attack Paternal Grandfather    Hypertension Mother    Cancer Maternal Grandmother    Diabetes Paternal Grandmother    Coronary artery disease Neg Hx    Colon cancer Neg Hx     Prior to Admission medications   Medication Sig Start Date End Date Taking? Authorizing Provider  albuterol (PROVENTIL HFA;VENTOLIN HFA) 108 (90 BASE) MCG/ACT inhaler Inhale 2 puffs into the lungs daily as needed for wheezing or shortness of breath.    [provider]  albuterol (PROVENTIL) (2.5 MG/3ML) 0.083% nebulizer solution Take 2.5 mg by nebulization every 6 (six) hours as needed for wheezing or shortness of breath.  05/24/19   [provider]  amitriptyline (ELAVIL) 100 MG tablet Take 100 mg by mouth at bedtime.    [provider]  ARIPiprazole (ABILIFY) 10 MG tablet Take 10 mg by mouth daily. 03/22/23   [provider]  atorvastatin (LIPITOR) 20 MG tablet Take 20 mg by mouth daily.    [provider]  budesonide-formoterol (SYMBICORT) 80-4.5 MCG/ACT inhaler Inhale 2 puffs into the  lungs 2 (two) times daily. Patient taking differently: Inhale 2 puffs into the lungs daily. 03/30/14   Standley Brooking, MD  busPIRone (BUSPAR) 15 MG tablet Take 15 mg by mouth 2 (two) times daily. 03/22/23   [provider]  cephALEXin (KEFLEX) 500 MG capsule Take 1 capsule (500 mg total) by mouth 3 (three) times daily for 6 days. 03/30/23 04/05/23  Hughie Closs, MD  DULoxetine (CYMBALTA) 60 MG capsule Take 60 mg by mouth daily. 06/24/22   [provider]  furosemide (LASIX) 20 MG tablet Take 1 tablet (20 mg total) by mouth daily. 04/01/23   Hughie Closs, MD  gabapentin (NEURONTIN) 300 MG capsule Take 300 mg by mouth 3 (three) times daily. 04/11/19   [provider]  levothyroxine (SYNTHROID, LEVOTHROID) 25 MCG tablet Take 25 mcg by mouth daily before breakfast.    [provider]  lisinopril (PRINIVIL,ZESTRIL) 2.5 MG tablet Take  2.5 mg by mouth daily.  01/18/18   [provider]  metFORMIN (GLUCOPHAGE) 500 MG tablet Take 2 tablets (1,000 mg total) by mouth 2 (two) times daily. 04/01/23   Hughie Closs, MD  methocarbamol (ROBAXIN) 500 MG tablet Take 1,000 mg by mouth in the morning and at bedtime.    [provider]  midodrine (PROAMATINE) 5 MG tablet Take 1 tablet (5 mg total) by mouth 3 (three) times daily with meals for 14 days, THEN 1 tablet (5 mg total) 2 (two) times daily with a meal for 7 days, THEN 1 tablet (5 mg total) daily for 7 days. 03/30/23 04/27/23  Hughie Closs, MD  morphine (MS CONTIN) 15 MG 12 hr tablet Take 15 mg by mouth 2 (two) times daily. 03/08/23   [provider]  ondansetron (ZOFRAN) 4 MG tablet Take 4 mg by mouth every 8 (eight) hours as needed for vomiting or nausea. 06/02/19   [provider]  oxycodone (ROXICODONE) 30 MG immediate release tablet Take 30 mg by mouth 5 (five) times daily. 09/21/22   [provider]  Pancrelipase, Lip-Prot-Amyl, (ZENPEP) 20000-63000 units CPEP Take 2 capsules by mouth in the  morning and at bedtime. 09/11/19   [provider]  pantoprazole (PROTONIX) 40 MG tablet Take 40 mg by mouth 2 (two) times daily.    [provider]  polyethylene glycol powder (GLYCOLAX/MIRALAX) powder Take 17 g by mouth 2 (two) times daily. Until daily soft stools  OTC Patient taking differently: Take 17 g by mouth as needed for mild constipation. Until daily soft stools  OTC 03/05/15   Barrett Henle, PA-C  valACYclovir (VALTREX) 500 MG tablet Take 500 mg by mouth daily.    [provider]    Physical Exam: Vitals:   04/05/23 1830 04/05/23 1845 04/05/23 1851 04/05/23 1915  BP: 117/68 125/70  100/74  Pulse: 92 89 90 92  Resp:  12 15 16   Temp:   98 F (36.7 C)   TempSrc:   Oral   SpO2: 99% 99% 100% 99%  Weight:      Height:       1.  General: Patient lying supine in bed,  no acute distress   2. Psychiatric: Alert and oriented x 3, mood and behavior normal for situation, pleasant and cooperative with exam   3. Neurologic: Speech and language are normal, face is symmetric, moves all 4 extremities voluntarily, at baseline without acute deficits on limited exam   4. HEENMT:  Head is atraumatic, normocephalic, pupils reactive to light, neck is supple, trachea is midline, mucous membranes are moist   5. Respiratory : Lungs are clear to auscultation bilaterally without wheezing, rhonchi, rales, no cyanosis, no increase in work of breathing or accessory muscle use   6. Cardiovascular : Heart rate normal, rhythm is regular, murmur present, rubs or gallops, no peripheral edema, peripheral pulses palpated   7. Gastrointestinal:  Abdomen is soft, nondistended, nontender to palpation bowel sounds active, no masses or organomegaly palpated   8. Skin:  Fluid-filled vesicles on the lateral aspect of the right foot, erythema three quarters of the way up her shin towards her knee on her left leg, left heel ulcer   9.Musculoskeletal:  No acute  deformities or trauma, no asymmetry in tone, no peripheral edema, peripheral pulses palpated, no tenderness to palpation in the extremities  Data Reviewed: In the ED Temp 98, heart rate 89-104, respiratory 12-16, blood pressure 95/63-128/82, satting 99-100% No leukocytosis with white  blood cell count of 10.3, hemoglobin 12.3, platelets 327 Chemistry reveals an elevated BUN at 27 and elevated creatinine at 1.36 Elevated lactic acid of 2.9 Blood cultures pending Chest x-ray shows no active disease X-ray of left foot shows subacute to chronic fracture at the second metatarsal and ulcer over plantar heel X-ray right foot shows a possible foreign body in the great toe with no evidence of osteomyelitis X-ray tib-fib left is without acute fracture EKG shows a heart rate of 85, sinus rhythm, QTc 4 and 63 Patient was started on cefepime, vancomycin, and given a dose of albuterol At admission tetanus shot was requested General surgery has been notified of the case.  Deciding on possible further imaging at this time  Assessment and Plan: * Diabetic foot (HCC) - With cellulitis and fluid-filled vesicles on right foot and ulcer on heel of left foot - ABIs - Vancomycin cefepime started in the ED - Continue vancomycin and cefepime - Patient recently finished Keflex today from previous admission - Procalcitonin pending - Consulted general surgery for possible debridement also possible foreign body in the right toe on imaging-not consistent with physical exam - Tetanus shot ordered - Continue to monitor  AKI (acute kidney injury) (HCC) - Creatinine at baseline 0.96 - Creatinine today 1.36 - Hold ACE - Reports poor fluid p.o. intake at home - Push p.o. fluids here - 1 L NS bolus given in ED - Trend in the a.m.  Lactic acidosis - Lactic acid 2.9 - Likely related to dehydration - No leukocytosis, or hypotension - Slightly tachycardic at arrival to 104 - 1 L bolus given in the ED - Trend  lactic acid - Continue to monitor  Hypothyroid - Continue Synthroid  DM type 2 (diabetes mellitus, type 2) (HCC) - Holding metformin - Sliding scale coverage  COPD exacerbation (HCC) - Continue formulary substitute for Symbicort - Continue to monitor  Hyperlipidemia - Continue statin  Tobacco abuse - Smokes a pack per day - Counseled on importance of cessation - Nicotine patch ordered  Essential hypertension - When last admitted was hypotensive - Was discharged on midodrine - Continue midodrine - No antihypertensives at this time      Advance Care Planning:   Code Status: Full Code  Consults: General Surgery  Family Communication: No family at bedside  Severity of Illness: The appropriate patient status for this patient is OBSERVATION. Observation status is judged to be reasonable and necessary in order to provide the required intensity of service to ensure the patient's safety. The patient's presenting symptoms, physical exam findings, and initial radiographic and laboratory data in the context of their medical condition is felt to place them at decreased risk for further clinical deterioration. Furthermore, it is anticipated that the patient will be medically stable for discharge from the hospital within 2 midnights of admission.   Author: Lilyan Gilford, DO 04/05/2023 9:50 PM  For on call review [email or web address].

## 2023-04-05 NOTE — ED Provider Notes (Signed)
Signed out to me by [facility], PA-C pending completion of workup and review of imaging.  Patient here with redness pain blistering to bilateral feet, was admitted on 03/27/2023 for similar symptoms developed septic shock was placed on pressors.  She eventually improved and was discharged home has been on oral antibiotics and took her last dose today.  Complains of continued pain swelling and redness to both feet and lower legs.  See previous provider note for complete H&P  Patient given cefepime and vancomycin here, lactic acid elevated, mild leukocytosis.  X-rays without evidence of osteomyelitis.  Given patient's previous septic shock and increased risk for reoccurrence, felt that patient would benefit from hospital admission for her recurrent cellulitis.  Of note, foreign body seen within the soft tissue of the distal tuft of the right great toe.  I do not appreciate any tenderness of the toe on exam, no open wound of the toe or lymphangitis of the toe  Consulted [facility] hospitalist, Dr. Carren Rang agrees to admit       DG Chest Portable 1 View  Result Date: 04/05/2023 CLINICAL DATA:  Cough EXAM: PORTABLE CHEST 1 VIEW COMPARISON:  03/27/2023 FINDINGS: Lungs are clear. No pneumothorax or pleural effusion. Moderate hiatal hernia. Cardiac size within normal limits. Pulmonary vascularity is normal. No acute bone abnormality. IMPRESSION: 1. No active disease. 2. Moderate hiatal hernia. Electronically Signed   By: Helyn Numbers M.D.   On: 04/05/2023 20:29   DG Foot Complete Right  Result Date: 04/05/2023 CLINICAL DATA:  Right foot wounds EXAM: RIGHT FOOT COMPLETE - 3+ VIEW COMPARISON:  None Available. FINDINGS: Clawtoe deformities of the second through fifth digits. No acute fracture or dislocation. Joint spaces are preserved. 7 mm wire like foreign body is seen within the soft tissues medial and plantar to the distal tuft of the great toe. There is moderate soft tissue swelling of the  right forefoot. No osseous erosions or abnormal periosteal reaction. IMPRESSION: 1. 7 mm wire like foreign body within the soft tissues medial and plantar to the distal tuft of the great toe. 2. Moderate soft tissue swelling of the right forefoot. No radiographic evidence of osteomyelitis. Electronically Signed   By: Helyn Numbers M.D.   On: 04/05/2023 20:28   DG Foot Complete Left  Result Date: 04/05/2023 CLINICAL DATA:  Wound over the heel and dorsal of the foot as well as anterior shin infection. Recent discharge for sepsis. EXAM: LEFT FOOT - COMPLETE 3+ VIEW; LEFT TIBIA AND FIBULA - 2 VIEW COMPARISON:  None Available. FINDINGS: No acute fracture or dislocation. Subacute or chronic fracture of the second metatarsal with some callus formation but nonunion. The bones are osteopenic. There is diffuse subcutaneous edema. Ulceration of the skin over the plantar heel with overlying Band-Aid. No radiopaque foreign object or soft tissue gas. IMPRESSION: 1. No acute fracture or dislocation. 2. Subacute or chronic fracture of the second metatarsal with nonunion. 3. Diffuse subcutaneous edema and ulceration of the skin of the plantar heel. Electronically Signed   By: Elgie Collard M.D.   On: 04/05/2023 20:27   DG Tibia/Fibula Left  Result Date: 04/05/2023 CLINICAL DATA:  Wound over the heel and dorsal of the foot as well as anterior shin infection. Recent discharge for sepsis. EXAM: LEFT FOOT - COMPLETE 3+ VIEW; LEFT TIBIA AND FIBULA - 2 VIEW COMPARISON:  None Available. FINDINGS: No acute fracture or dislocation. Subacute or chronic fracture of the second metatarsal with some callus formation but nonunion. The bones  are osteopenic. There is diffuse subcutaneous edema. Ulceration of the skin over the plantar heel with overlying Band-Aid. No radiopaque foreign object or soft tissue gas. IMPRESSION: 1. No acute fracture or dislocation. 2. Subacute or chronic fracture of the second metatarsal with nonunion. 3.  Diffuse subcutaneous edema and ulceration of the skin of the plantar heel. Electronically Signed   By: Elgie Collard M.D.   On: 04/05/2023 20:27         Pauline Aus, PA-C 04/05/23 2126    Anders Simmonds T, DO 04/06/23 1610

## 2023-04-05 NOTE — Assessment & Plan Note (Signed)
-   Creatinine at baseline 0.96 - Creatinine today 1.36 - Hold ACE - Reports poor fluid p.o. intake at home - Push p.o. fluids here - 1 L NS bolus given in ED - Trend in the a.m.

## 2023-04-05 NOTE — Assessment & Plan Note (Signed)
-   With cellulitis and fluid-filled vesicles on right foot and ulcer on heel of left foot - ABIs - Vancomycin cefepime started in the ED - Continue vancomycin and cefepime - Patient recently finished Keflex today from previous admission - Procalcitonin pending - Consulted general surgery for possible debridement also possible foreign body in the right toe on imaging-not consistent with physical exam - Tetanus shot ordered - Continue to monitor

## 2023-04-05 NOTE — Assessment & Plan Note (Signed)
-   Continue formulary substitute for Symbicort - Continue to monitor

## 2023-04-05 NOTE — Assessment & Plan Note (Signed)
- 

## 2023-04-06 ENCOUNTER — Encounter (HOSPITAL_COMMUNITY)
Admission: EM | Disposition: A | Discharge status: Home / Self Care | Payer: Self-pay | Source: Home / Self Care | Attending: Internal Medicine

## 2023-04-06 ENCOUNTER — Encounter (HOSPITAL_COMMUNITY): Payer: Self-pay | Admitting: Hematology

## 2023-04-06 ENCOUNTER — Observation Stay (HOSPITAL_COMMUNITY): Payer: Medicare Other

## 2023-04-06 DIAGNOSIS — Z7984 Long term (current) use of oral hypoglycemic drugs: Secondary | ICD-10-CM | POA: Diagnosis not present

## 2023-04-06 DIAGNOSIS — Z8249 Family history of ischemic heart disease and other diseases of the circulatory system: Secondary | ICD-10-CM | POA: Diagnosis not present

## 2023-04-06 DIAGNOSIS — Z23 Encounter for immunization: Secondary | ICD-10-CM | POA: Diagnosis present

## 2023-04-06 DIAGNOSIS — E785 Hyperlipidemia, unspecified: Secondary | ICD-10-CM | POA: Diagnosis present

## 2023-04-06 DIAGNOSIS — K8681 Exocrine pancreatic insufficiency: Secondary | ICD-10-CM | POA: Diagnosis present

## 2023-04-06 DIAGNOSIS — E1140 Type 2 diabetes mellitus with diabetic neuropathy, unspecified: Secondary | ICD-10-CM | POA: Diagnosis present

## 2023-04-06 DIAGNOSIS — S90454A Superficial foreign body, right lesser toe(s), initial encounter: Secondary | ICD-10-CM | POA: Diagnosis not present

## 2023-04-06 DIAGNOSIS — L03116 Cellulitis of left lower limb: Secondary | ICD-10-CM | POA: Diagnosis present

## 2023-04-06 DIAGNOSIS — F319 Bipolar disorder, unspecified: Secondary | ICD-10-CM | POA: Diagnosis present

## 2023-04-06 DIAGNOSIS — E876 Hypokalemia: Secondary | ICD-10-CM | POA: Diagnosis present

## 2023-04-06 DIAGNOSIS — L97429 Non-pressure chronic ulcer of left heel and midfoot with unspecified severity: Secondary | ICD-10-CM | POA: Diagnosis present

## 2023-04-06 DIAGNOSIS — I10 Essential (primary) hypertension: Secondary | ICD-10-CM | POA: Diagnosis present

## 2023-04-06 DIAGNOSIS — E118 Type 2 diabetes mellitus with unspecified complications: Secondary | ICD-10-CM | POA: Diagnosis not present

## 2023-04-06 DIAGNOSIS — S90822A Blister (nonthermal), left foot, initial encounter: Secondary | ICD-10-CM | POA: Diagnosis present

## 2023-04-06 DIAGNOSIS — J441 Chronic obstructive pulmonary disease with (acute) exacerbation: Secondary | ICD-10-CM | POA: Diagnosis present

## 2023-04-06 DIAGNOSIS — S90821A Blister (nonthermal), right foot, initial encounter: Secondary | ICD-10-CM | POA: Diagnosis present

## 2023-04-06 DIAGNOSIS — N179 Acute kidney failure, unspecified: Secondary | ICD-10-CM | POA: Diagnosis present

## 2023-04-06 DIAGNOSIS — R062 Wheezing: Secondary | ICD-10-CM | POA: Diagnosis present

## 2023-04-06 DIAGNOSIS — E11621 Type 2 diabetes mellitus with foot ulcer: Secondary | ICD-10-CM | POA: Diagnosis present

## 2023-04-06 DIAGNOSIS — E872 Acidosis, unspecified: Secondary | ICD-10-CM | POA: Diagnosis present

## 2023-04-06 DIAGNOSIS — M795 Residual foreign body in soft tissue: Secondary | ICD-10-CM | POA: Diagnosis present

## 2023-04-06 DIAGNOSIS — Z1152 Encounter for screening for COVID-19: Secondary | ICD-10-CM | POA: Diagnosis not present

## 2023-04-06 DIAGNOSIS — F17210 Nicotine dependence, cigarettes, uncomplicated: Secondary | ICD-10-CM | POA: Diagnosis present

## 2023-04-06 DIAGNOSIS — F419 Anxiety disorder, unspecified: Secondary | ICD-10-CM | POA: Diagnosis present

## 2023-04-06 DIAGNOSIS — E860 Dehydration: Secondary | ICD-10-CM | POA: Diagnosis present

## 2023-04-06 DIAGNOSIS — E039 Hypothyroidism, unspecified: Secondary | ICD-10-CM | POA: Diagnosis present

## 2023-04-06 DIAGNOSIS — K861 Other chronic pancreatitis: Secondary | ICD-10-CM | POA: Diagnosis present

## 2023-04-06 LAB — COMPREHENSIVE METABOLIC PANEL
ALT: 14 U/L (ref 0–44)
AST: 16 U/L (ref 15–41)
Albumin: 2.1 g/dL — ABNORMAL LOW (ref 3.5–5.0)
Alkaline Phosphatase: 74 U/L (ref 38–126)
Anion gap: 11 (ref 5–15)
BUN: 23 mg/dL — ABNORMAL HIGH (ref 6–20)
CO2: 23 mmol/L (ref 22–32)
Calcium: 7.2 mg/dL — ABNORMAL LOW (ref 8.9–10.3)
Chloride: 105 mmol/L (ref 98–111)
Creatinine, Ser: 1.1 mg/dL — ABNORMAL HIGH (ref 0.44–1.00)
GFR, Estimated: 60 mL/min — ABNORMAL LOW (ref >60)
Glucose, Bld: 79 mg/dL (ref 70–99)
Potassium: 3.4 mmol/L — ABNORMAL LOW (ref 3.5–5.1)
Sodium: 139 mmol/L (ref 135–145)
Total Bilirubin: 0.3 mg/dL (ref 0.3–1.2)
Total Protein: 4.8 g/dL — ABNORMAL LOW (ref 6.5–8.1)

## 2023-04-06 LAB — GLUCOSE, CAPILLARY
Glucose-Capillary: 73 mg/dL (ref 70–99)
Glucose-Capillary: 198 mg/dL — ABNORMAL HIGH (ref 70–99)
Glucose-Capillary: 154 mg/dL — ABNORMAL HIGH (ref 70–99)
Glucose-Capillary: 139 mg/dL — ABNORMAL HIGH (ref 70–99)

## 2023-04-06 LAB — CBC WITH DIFFERENTIAL/PLATELET
Abs Immature Granulocytes: 0.05 10*3/uL (ref 0.00–0.07)
Basophils Absolute: 0 10*3/uL (ref 0.0–0.1)
Basophils Relative: 1 %
Eosinophils Absolute: 0.3 10*3/uL (ref 0.0–0.5)
Eosinophils Relative: 5 %
HCT: 28.5 % — ABNORMAL LOW (ref 36.0–46.0)
Hemoglobin: 9.4 g/dL — ABNORMAL LOW (ref 12.0–15.0)
Immature Granulocytes: 1 %
Lymphocytes Relative: 32 %
Lymphs Abs: 2.1 10*3/uL (ref 0.7–4.0)
MCH: 33.2 pg (ref 26.0–34.0)
MCHC: 33 g/dL (ref 30.0–36.0)
MCV: 100.7 fL — ABNORMAL HIGH (ref 80.0–100.0)
Monocytes Absolute: 0.5 10*3/uL (ref 0.1–1.0)
Monocytes Relative: 8 %
Neutro Abs: 3.6 10*3/uL (ref 1.7–7.7)
Neutrophils Relative %: 53 %
Platelets: 248 10*3/uL (ref 150–400)
RBC: 2.83 MIL/uL — ABNORMAL LOW (ref 3.87–5.11)
RDW: 13.1 % (ref 11.5–15.5)
WBC: 6.6 10*3/uL (ref 4.0–10.5)
nRBC: 0 % (ref 0.0–0.2)

## 2023-04-06 LAB — MAGNESIUM: Magnesium: 1.1 mg/dL — ABNORMAL LOW (ref 1.7–2.4)

## 2023-04-06 LAB — C-REACTIVE PROTEIN
CRP: <0.5 mg/dL (ref <1.0)
CRP: 0.6 mg/dL (ref <1.0)

## 2023-04-06 LAB — TSH: TSH: 2.177 u[IU]/mL (ref 0.350–4.500)

## 2023-04-06 LAB — PREALBUMIN: Prealbumin: 10 mg/dL — ABNORMAL LOW (ref 18–38)

## 2023-04-06 SURGERY — IRRIGATION AND DEBRIDEMENT FOOT
Anesthesia: Choice | Laterality: Bilateral

## 2023-04-06 MED ORDER — POTASSIUM CHLORIDE 10 MEQ/100ML IV SOLN
10.0000 meq | INTRAVENOUS | Status: AC
  Administered 2023-04-06: 10 meq via INTRAVENOUS
  Filled 2023-04-06 (×2): qty 100

## 2023-04-06 MED ORDER — GLUCERNA SHAKE PO LIQD
237.0000 mL | Freq: Three times a day (TID) | ORAL | Status: DC
  Administered 2023-04-06: 237 mL via ORAL

## 2023-04-06 MED ORDER — MAGNESIUM SULFATE 4 GM/100ML IV SOLN
4.0000 g | Freq: Once | INTRAVENOUS | Status: AC
  Administered 2023-04-06: 4 g via INTRAVENOUS
  Filled 2023-04-06: qty 100

## 2023-04-06 MED ORDER — ADULT MULTIVITAMIN W/MINERALS CH
1.0000 | ORAL_TABLET | Freq: Every day | ORAL | Status: DC
  Administered 2023-04-06 – 2023-04-07 (×2): 1 via ORAL
  Filled 2023-04-06 (×2): qty 1

## 2023-04-06 MED ORDER — POTASSIUM CHLORIDE 10 MEQ/100ML IV SOLN
10.0000 meq | INTRAVENOUS | Status: AC
  Administered 2023-04-06 (×3): 10 meq via INTRAVENOUS
  Filled 2023-04-06 (×2): qty 100

## 2023-04-06 MED ORDER — IPRATROPIUM-ALBUTEROL 0.5-2.5 (3) MG/3ML IN SOLN: 3.0000 mL | RESPIRATORY_TRACT | Status: DC | PRN

## 2023-04-06 MED ORDER — TRAZODONE HCL 50 MG PO TABS: 50.0000 mg | ORAL_TABLET | Freq: Every evening | ORAL | Status: DC | PRN

## 2023-04-06 MED ORDER — SENNOSIDES-DOCUSATE SODIUM 8.6-50 MG PO TABS: 1.0000 | ORAL_TABLET | Freq: Every evening | ORAL | Status: DC | PRN

## 2023-04-06 MED ORDER — HYDRALAZINE HCL 20 MG/ML IJ SOLN: 10.0000 mg | INTRAMUSCULAR | Status: DC | PRN

## 2023-04-06 MED ORDER — GUAIFENESIN 100 MG/5ML PO LIQD: 5.0000 mL | ORAL | Status: DC | PRN

## 2023-04-06 MED ORDER — DEXTROSE-SODIUM CHLORIDE 5-0.45 % IV SOLN: INTRAVENOUS | Status: AC

## 2023-04-06 MED ORDER — SODIUM CHLORIDE 0.9 % IV SOLN
2.0000 g | Freq: Two times a day (BID) | INTRAVENOUS | Status: DC
  Administered 2023-04-06 – 2023-04-07 (×2): 2 g via INTRAVENOUS
  Filled 2023-04-06 (×2): qty 12.5

## 2023-04-06 NOTE — Consult Note (Addendum)
I was present with the medical student for this service. I personally verified the history of present illness, performed the physical exam, and made the plan for this encounter. I have verified the medical student's documentation and made modifications where appropriately. I have personally documented in my own words a brief history, physical, and plan below.     Patient with recent admission for cellulitis and blisters being managed with wound care recommendations by wound RN. Admitted last night with right foot pain and Xray with concern for foreign body. Dr. Dorthula Perfect asked my opinion and we got a CT scan to look at this further.  CT reviewed and showed patient. Small piece of wire in the medial distal aspect of great right toe likely in the callus based on the location on the CT. No redness or pain or signs of infection in this area. No known injury or foreign body. No prior Xray or CT of the right foot to know how long this was present.  Palpable DP on the right.   Reviewed imaging with MRI tech and given the small superficial location of the 6-63mm piece of wire they do not think this would ever preclude an MRI in the future given her risk of future diabetic foot infections.  I have reached out to some colleagues to get their opinion about prophylactic removal of this small foreign body which could be difficult to find and also will create a wound that she will have to heal.   I feel like leaving this small piece of wire may be the best option given the lack of symptoms or issues currently but I want to discuss it further with other people.   No surgical intervention today. Continue blister/ heel wound care per the wound RN recommendations.  ABI ordered to get a baseline.   Algis Greenhouse, MD [facility] [address], [place] [address] [phone] (office)      Reason for Consult: foreign body in R great toe, possible need for  debridement Referring Physician: Dr. Elisabeth Most P [name] is an 52 y.o. female.  HPI: Pt presents with around a week of pain and wounds/blisters of bilateral feet. Pt reports symptoms first presented with a blister of her L heel which popped and became an ulcer around a week ago; she had presented to the hospital when this became painful and was treated for cellulitis and sepsis with abx at that time. She now returns to the hospital for continued blisters of her bilateral feet with ulcer of left heel. She reports redness has improved in her feet and legs. She reports some pain at the site of her blisters. She reports experiencing chills and subjective fever over the past week; she has been afebrile so far in the hospital. She has a history of diabetic neuropathy. Denies chest pain, SOB this morning.  She was also found to have a foreign body of her Right great toe by CT scan; pt does not remember ever stepping on a piece of metal or wire. She denies pain, redness of R great toe.  PMHx: - HTN - HLD - DM with diabetic neuropathy Denies hx of heart disease. Past Medical History:  Diagnosis Date   Anemia    Anxiety    Asthma    Bipolar 1 disorder (HCC)    Chronic abdominal pain    COPD (chronic obstructive pulmonary disease) (HCC)    Depression    Diabetes mellitus    Diabetic neuropathy (HCC)  10/29/2017   Esophagitis    Hiatal hernia    Hyperlipidemia 02/03/2012   Iron deficiency anemia due to chronic blood loss 01/29/2022   Migraine    Neck pain 06/08/2012   Pancreatitis chronic Idiopathic   Treated by Dr. Margaretha Glassing at [facility] in the past with celiac blocks.   PSHx: - Cholecystectomy - Hysterectomy - R elbow surgery - Ear vs head surgery (?) Pt denies prior surgery to either foot Past Surgical History:  Procedure Laterality Date   ABDOMINAL HYSTERECTOMY     attempted colonoscopy  02/2017   Dr. Margaretha Glassing: Poor prep, scope passed to mid transverse colon before  colonoscopy aborted.  No significant findings noted to mid transverse colon.  Next colonoscopy in 2 years.   CELIAC PLEXUS BLOCK     CHOLECYSTECTOMY     COLONOSCOPY N/A 12/05/2012   ZOX:WRUEAV rectum, colon and terminal ileum   ESOPHAGOGASTRODUODENOSCOPY  02/20/2008   WUJ:[id number] distal esophageal mucosa, suspicious for neoplasm/Hiatal hernia otherwise normal    ESOPHAGOGASTRODUODENOSCOPY  04/03/2008   NFA:[id number] hiatal hernia, otherwise normal/Short, tight, benign-appearing peptic stricture   ESOPHAGOGASTRODUODENOSCOPY  November 16, 2012   Dr. Teena Dunk: hiatal hernia, esophagitis,    ESOPHAGOGASTRODUODENOSCOPY (EGD) WITH PROPOFOL N/A 05/09/2019   Dr. Darrick Penna: Large hiatal hernia with erythema and edema in the pouch, mild gastritis.  No biopsies taken.   ESOPHAGOGASTRODUODENOSCOPY (EGD) WITH PROPOFOL N/A 05/26/2019   Procedure: ESOPHAGOGASTRODUODENOSCOPY (EGD) WITH PROPOFOL;  Surgeon: West Bali, MD;  [facility];  Service: Endoscopy;  Laterality: N/A;   EUS  12/2018   Dr. Margaretha Glassing: LA grade B esophagitis, antritis but negative H. pylori, 1 cm ulcer at the duodenal sweep   GIVENS CAPSULE STUDY N/A 12/05/2012   Few superficial erosions but nothing found to explain iron deficiency anemia.    GIVENS CAPSULE STUDY N/A 05/26/2019   Procedure: GIVENS CAPSULE STUDY;  Surgeon: West Bali, MD;  [facility]: [facility];  Service: Endoscopy;  Laterality: N/A;   Ileocolonoscopy  06/05/2008     EXB:MWUXLKG anal canal, otherwise normal rectum, colon   TONSILLECTOMY      Family History  Problem Relation Age of Onset   Asthma Other    Asthma Paternal Grandfather    Heart attack Paternal Grandfather    Hypertension Mother    Cancer Maternal Grandmother    Diabetes Paternal Grandmother    Coronary artery disease Neg Hx    Colon cancer Neg Hx     Social History:  reports that she has been smoking cigarettes. She has a 25 pack-year smoking history. She has been exposed to  tobacco smoke. She has never used smokeless tobacco. She reports that she does not drink alcohol and does not use drugs. Pt denies current alcohol, tobacco, recreational drug use.  Allergies:  Allergies  Allergen Reactions   Penicillins Shortness Of Breath     Tolerated ceftriaxone 03/27/23   Sulfa Antibiotics Shortness Of Breath   Aspirin Nausea And Vomiting   Enoxaparin Other (See Comments)    Has been known to cause her blood clots in her legs   Prednisone Other (See Comments)    Pancreatitis, but has to take for asthma sometimes   Venofer [Iron Sucrose] Nausea Only and Swelling    Medications: I have reviewed the patient's current medications. - Atorvastatin - Metformin - BP medication (unsure which ones) Pt denies taking aspirin, blood thinners regularly. Current Facility-Administered Medications  Medication Dose Route Frequency Provider Last Rate Last Admin   acetaminophen (TYLENOL) tablet  650 mg  650 mg Oral Q6H PRN Zierle-Ghosh, Asia B, DO       Or   acetaminophen (TYLENOL) suppository 650 mg  650 mg Rectal Q6H PRN Zierle-Ghosh, Asia B, DO       amitriptyline (ELAVIL) tablet 100 mg  100 mg Oral QHS Zierle-Ghosh, Asia B, DO   100 mg at 04/06/23 0008   ARIPiprazole (ABILIFY) tablet 10 mg  10 mg Oral Daily Zierle-Ghosh, Asia B, DO       atorvastatin (LIPITOR) tablet 20 mg  20 mg Oral Daily Zierle-Ghosh, Asia B, DO       busPIRone (BUSPAR) tablet 15 mg  15 mg Oral BID Zierle-Ghosh, Asia B, DO   15 mg at 04/06/23 0008   ceFEPIme (MAXIPIME) 2 g in sodium chloride 0.9 % 100 mL IVPB  2 g Intravenous Q24H Zierle-Ghosh, Asia B, DO   Stopped at 04/05/23 2040   dextrose 5 % and 0.45 % NaCl infusion   Intravenous Continuous Amin, Ankit C, MD 75 mL/hr at 04/06/23 0858 New Bag at 04/06/23 0858   DULoxetine (CYMBALTA) DR capsule 60 mg  60 mg Oral Daily Zierle-Ghosh, Asia B, DO       gabapentin (NEURONTIN) capsule 300 mg  300 mg Oral TID Zierle-Ghosh, Asia B, DO   300 mg at 04/06/23 0009    guaiFENesin (ROBITUSSIN) 100 MG/5ML liquid 5 mL  5 mL Oral Q4H PRN Amin, Ankit C, MD       heparin injection 5,000 Units  5,000 Units Subcutaneous Q8H Zierle-Ghosh, Asia B, DO   5,000 Units at 04/06/23 0617   hydrALAZINE (APRESOLINE) injection 10 mg  10 mg Intravenous Q4H PRN Amin, Ankit C, MD       insulin aspart (novoLOG) injection 0-15 Units  0-15 Units Subcutaneous TID WC Zierle-Ghosh, Asia B, DO       insulin aspart (novoLOG) injection 0-5 Units  0-5 Units Subcutaneous QHS Zierle-Ghosh, Asia B, DO       ipratropium-albuterol (DUONEB) 0.5-2.5 (3) MG/3ML nebulizer solution 3 mL  3 mL Nebulization Q4H PRN Amin, Ankit C, MD       levothyroxine (SYNTHROID) tablet 25 mcg  25 mcg Oral QAC breakfast Zierle-Ghosh, Asia B, DO   25 mcg at 04/06/23 0617   magnesium sulfate IVPB 4 g 100 mL  4 g Intravenous Once Amin, Ankit C, MD 50 mL/hr at 04/06/23 0958 4 g at 04/06/23 0958   midodrine (PROAMATINE) tablet 5 mg  5 mg Oral TID WC Zierle-Ghosh, Asia B, DO   5 mg at 04/06/23 0847   mometasone-formoterol (DULERA) 100-5 MCG/ACT inhaler 2 puff  2 puff Inhalation BID Zierle-Ghosh, Asia B, DO   2 puff at 04/06/23 0751   morphine (MS CONTIN) 12 hr tablet 15 mg  15 mg Oral BID Zierle-Ghosh, Asia B, DO   15 mg at 04/06/23 0010   morphine (PF) 2 MG/ML injection 2 mg  2 mg Intravenous Q2H PRN Zierle-Ghosh, Asia B, DO   2 mg at 04/06/23 0641   nicotine (NICODERM CQ - dosed in mg/24 hours) patch 21 mg  21 mg Transdermal Daily Zierle-Ghosh, Asia B, DO   21 mg at 04/06/23 0010   ondansetron (ZOFRAN) tablet 4 mg  4 mg Oral Q6H PRN Zierle-Ghosh, Asia B, DO       Or   ondansetron (ZOFRAN) injection 4 mg  4 mg Intravenous Q6H PRN Zierle-Ghosh, Asia B, DO       oxyCODONE (Oxy IR/ROXICODONE) immediate release tablet 5 mg  5 mg Oral Q4H PRN Zierle-Ghosh, Asia B, DO       pantoprazole (PROTONIX) EC tablet 40 mg  40 mg Oral BID Zierle-Ghosh, Asia B, DO   40 mg at 04/06/23 0009   potassium chloride 10 mEq in 100 mL IVPB  10 mEq  Intravenous Q1 Hr x 4 Amin, Ankit C, MD       senna-docusate (Senokot-S) tablet 1 tablet  1 tablet Oral QHS PRN Amin, Ankit C, MD       Tdap (BOOSTRIX) injection 0.5 mL  0.5 mL Intramuscular Once Triplett, Tammy, PA-C       traZODone (DESYREL) tablet 50 mg  50 mg Oral QHS PRN Amin, Ankit C, MD       [START ON 04/07/2023] vancomycin (VANCOREADY) IVPB 750 mg/150 mL  750 mg Intravenous Q48H Zierle-Ghosh, Asia B, DO        Results for orders placed or performed during the hospital encounter of 04/05/23 (from the past 48 hour(s))  CBC with Differential     Status: Abnormal   Collection Time: 04/05/23  5:05 PM  Result Value Ref Range   WBC 10.6 (H) 4.0 - 10.5 K/uL   RBC 3.82 (L) 3.87 - 5.11 MIL/uL   Hemoglobin 12.3 12.0 - 15.0 g/dL   HCT 16.1 09.6 - 04.5 %   MCV 101.0 (H) 80.0 - 100.0 fL   MCH 32.2 26.0 - 34.0 pg   MCHC 31.9 30.0 - 36.0 g/dL   RDW 40.9 81.1 - 91.4 %   Platelets 327 150 - 400 K/uL   nRBC 0.0 0.0 - 0.2 %   Neutrophils Relative % 67 %   Neutro Abs 7.0 1.7 - 7.7 K/uL   Lymphocytes Relative 23 %   Lymphs Abs 2.4 0.7 - 4.0 K/uL   Monocytes Relative 6 %   Monocytes Absolute 0.6 0.1 - 1.0 K/uL   Eosinophils Relative 3 %   Eosinophils Absolute 0.4 0.0 - 0.5 K/uL   Basophils Relative 0 %   Basophils Absolute 0.0 0.0 - 0.1 K/uL   Immature Granulocytes 1 %   Abs Immature Granulocytes 0.09 (H) 0.00 - 0.07 K/uL    Comment: Performed at [facility], [address], [place], [place] [address]  Comprehensive metabolic panel     Status: Abnormal   Collection Time: 04/05/23  5:05 PM  Result Value Ref Range   Sodium 134 (L) 135 - 145 mmol/L   Potassium 3.7 3.5 - 5.1 mmol/L   Chloride 97 (L) 98 - 111 mmol/L   CO2 27 22 - 32 mmol/L   Glucose, Bld 152 (H) 70 - 99 mg/dL    Comment: Glucose reference range applies only to samples taken after fasting for at least 8 hours.   BUN 27 (H) 6 - 20 mg/dL   Creatinine, Ser 6.21 (H) 0.44 - 1.00 mg/dL   Calcium 7.9 (L) 8.9 - 10.3 mg/dL   Total  Protein 6.9 6.5 - 8.1 g/dL   Albumin 3.1 (L) 3.5 - 5.0 g/dL   AST 20 15 - 41 U/L   ALT 22 0 - 44 U/L   Alkaline Phosphatase 114 38 - 126 U/L   Total Bilirubin 0.5 0.3 - 1.2 mg/dL   GFR, Estimated 46 (L) >60 mL/min    Comment: (NOTE) Calculated using the CKD-EPI Creatinine Equation (2021)    Anion gap 10 5 - 15    Comment: Performed at [facility], [address], [place], [place] [address]  Sedimentation rate  Status: Abnormal   Collection Time: 04/05/23  5:05 PM  Result Value Ref Range   Sed Rate 40 (H) 0 - 22 mm/hr    Comment: Performed at [facility], [address] [place], [place] [address]  Lactic acid, plasma     Status: Abnormal   Collection Time: 04/05/23  5:20 PM  Result Value Ref Range   Lactic Acid, Venous 2.9 (HH) 0.5 - 1.9 mmol/L    Comment: CRITICAL RESULT CALLED TO, READ BACK BY AND VERIFIED WITH RAGLAND,M ON 04/05/23 AT 1950 BY LOY,C Performed at [facility], [address], [place], [place] [address]   Blood culture (routine x 2)     Status: None (Preliminary result)   Collection Time: 04/05/23  5:20 PM   Specimen: Right Antecubital; Blood  Result Value Ref Range   Specimen Description RIGHT ANTECUBITAL    Special Requests      BOTTLES DRAWN AEROBIC ONLY Blood Culture adequate volume Performed at [facility], [address] [place], [place] [address]    Culture PENDING    Report Status PENDING   Blood culture (routine x 2)     Status: None (Preliminary result)   Collection Time: 04/05/23  5:44 PM   Specimen: BLOOD  Result Value Ref Range   Specimen Description BLOOD BLOOD LEFT ARM    Special Requests      BOTTLES DRAWN AEROBIC AND ANAEROBIC Blood Culture results may not be optimal due to an excessive volume of blood received in culture bottles Performed at [facility], [address] [place], [place] [address]    Culture PENDING    Report Status PENDING   POC CBG, ED     Status: Abnormal   Collection Time: 04/05/23  5:52 PM  Result Value Ref Range    Glucose-Capillary 125 (H) 70 - 99 mg/dL    Comment: Glucose reference range applies only to samples taken after fasting for at least 8 hours.  Lactic acid, plasma     Status: Abnormal   Collection Time: 04/05/23  9:24 PM  Result Value Ref Range   Lactic Acid, Venous 3.3 (HH) 0.5 - 1.9 mmol/L    Comment: CRITICAL VALUE NOTED.  VALUE IS CONSISTENT WITH PREVIOUSLY REPORTED AND CALLED VALUE. Performed at [facility], [address] [place], [place] [address]   Procalcitonin     Status: None   Collection Time: 04/05/23  9:24 PM  Result Value Ref Range   Procalcitonin <0.10 ng/mL    Comment:        Interpretation: PCT (Procalcitonin) <= 0.5 ng/mL: Systemic infection (sepsis) is not likely. Local bacterial infection is possible. (NOTE)       Sepsis PCT Algorithm           Lower Respiratory Tract                                      Infection PCT Algorithm    ----------------------------     ----------------------------         PCT < 0.25 ng/mL                PCT < 0.10 ng/mL          Strongly encourage             Strongly discourage   discontinuation of antibiotics    initiation of antibiotics    ----------------------------     -----------------------------  PCT 0.25 - 0.50 ng/mL            PCT 0.10 - 0.25 ng/mL               OR       >80% decrease in PCT            Discourage initiation of                                            antibiotics      Encourage discontinuation           of antibiotics    ----------------------------     -----------------------------         PCT >= 0.50 ng/mL              PCT 0.26 - 0.50 ng/mL               AND        <80% decrease in PCT             Encourage initiation of                                             antibiotics       Encourage continuation           of antibiotics    ----------------------------     -----------------------------        PCT >= 0.50 ng/mL                  PCT > 0.50 ng/mL               AND         increase  in PCT                  Strongly encourage                                      initiation of antibiotics    Strongly encourage escalation           of antibiotics                                     -----------------------------                                           PCT <= 0.25 ng/mL                                                 OR                                        > 80% decrease in PCT  Discontinue / Do not initiate                                             antibiotics  Performed at [facility], [address], [place], [place] [address]   Glucose, capillary     Status: Abnormal   Collection Time: 04/05/23 10:19 PM  Result Value Ref Range   Glucose-Capillary 196 (H) 70 - 99 mg/dL    Comment: Glucose reference range applies only to samples taken after fasting for at least 8 hours.  Comprehensive metabolic panel     Status: Abnormal   Collection Time: 04/06/23  3:50 AM  Result Value Ref Range   Sodium 139 135 - 145 mmol/L   Potassium 3.4 (L) 3.5 - 5.1 mmol/L   Chloride 105 98 - 111 mmol/L   CO2 23 22 - 32 mmol/L   Glucose, Bld 79 70 - 99 mg/dL    Comment: Glucose reference range applies only to samples taken after fasting for at least 8 hours.   BUN 23 (H) 6 - 20 mg/dL   Creatinine, Ser 6.21 (H) 0.44 - 1.00 mg/dL   Calcium 7.2 (L) 8.9 - 10.3 mg/dL   Total Protein 4.8 (L) 6.5 - 8.1 g/dL   Albumin 2.1 (L) 3.5 - 5.0 g/dL   AST 16 15 - 41 U/L   ALT 14 0 - 44 U/L   Alkaline Phosphatase 74 38 - 126 U/L   Total Bilirubin 0.3 0.3 - 1.2 mg/dL   GFR, Estimated 60 (L) >60 mL/min    Comment: (NOTE) Calculated using the CKD-EPI Creatinine Equation (2021)    Anion gap 11 5 - 15    Comment: Performed at [facility], [address] [place], [place] [address]  Magnesium     Status: Abnormal   Collection Time: 04/06/23  3:50 AM  Result Value Ref Range   Magnesium 1.1 (L) 1.7 - 2.4 mg/dL    Comment: Performed at [facility], [address] [place], [place] [address]  CBC with Differential/Platelet     Status: Abnormal   Collection Time: 04/06/23  3:50 AM  Result Value Ref Range   WBC 6.6 4.0 - 10.5 K/uL   RBC 2.83 (L) 3.87 - 5.11 MIL/uL   Hemoglobin 9.4 (L) 12.0 - 15.0 g/dL    Comment: REPEATED TO VERIFY   HCT 28.5 (L) 36.0 - 46.0 %   MCV 100.7 (H) 80.0 - 100.0 fL   MCH 33.2 26.0 - 34.0 pg   MCHC 33.0 30.0 - 36.0 g/dL   RDW 62.9 52.8 - 41.3 %   Platelets 248 150 - 400 K/uL   nRBC 0.0 0.0 - 0.2 %   Neutrophils Relative % 53 %   Neutro Abs 3.6 1.7 - 7.7 K/uL   Lymphocytes Relative 32 %   Lymphs Abs 2.1 0.7 - 4.0 K/uL   Monocytes Relative 8 %   Monocytes Absolute 0.5 0.1 - 1.0 K/uL   Eosinophils Relative 5 %   Eosinophils Absolute 0.3 0.0 - 0.5 K/uL   Basophils Relative 1 %   Basophils Absolute 0.0 0.0 - 0.1 K/uL   Immature Granulocytes 1 %   Abs Immature Granulocytes 0.05 0.00 - 0.07 K/uL    Comment: Performed at [facility], [address], [place], [place] [address]  TSH     Status: None   Collection Time:  04/06/23  3:50 AM  Result Value Ref Range   TSH 2.177 0.350 - 4.500 uIU/mL    Comment: Performed by a 3rd Generation assay with a functional sensitivity of <=0.01 uIU/mL. Performed at [facility], [address], [place], [place] [address]   Glucose, capillary     Status: None   Collection Time: 04/06/23  7:14 AM  Result Value Ref Range   Glucose-Capillary 73 70 - 99 mg/dL    Comment: Glucose reference range applies only to samples taken after fasting for at least 8 hours.    CT FOOT RIGHT WO CONTRAST  Result Date: 04/06/2023 CLINICAL DATA:  Right foot wound, right foot pain EXAM: CT OF THE RIGHT FOOT WITHOUT CONTRAST TECHNIQUE: Multidetector CT imaging of the right foot was performed according to the standard protocol. Multiplanar CT image reconstructions were also generated. RADIATION DOSE REDUCTION: This exam was performed according to the departmental dose-optimization program which includes  automated exposure control, adjustment of the mA and/or kV according to patient size and/or use of iterative reconstruction technique. COMPARISON:  Plain radiographs obtained 04/05/2023 FINDINGS: Bones/Joint/Cartilage Clawtoe deformities of the second through fifth digits. No acute fracture or dislocation. Joint spaces are preserved. No osseous erosions or abnormal periosteal reaction. Ligaments Suboptimally assessed by CT. Muscles and Tendons There is marked fatty atrophy of the intrinsic musculature of the right foot. Visualized tendinous structures are unremarkable. Soft tissues A 7 mm wire like metallic foreign body is seen within the medial soft tissues of the right great toe adjacent to the distal tuft of the distal phalanx (image # 18/7). Mild diffuse subcutaneous edema of the right foot. A loculated intradermal fluid collection is seen within the lateral hindfoot measuring 1.6 x 4.7 x 4.5 cm. IMPRESSION: 1. No acute fracture or dislocation. No CT evidence of osteomyelitis. 2. 7 mm wire like metallic foreign body within the medial soft tissues of the right great toe adjacent to the distal tuft of the distal phalanx. 3. Mild diffuse subcutaneous edema of the right foot. 4. 1.6 x 4.7 x 4.5 cm loculated intradermal fluid collection within the lateral hindfoot. This may represent a cyst or seroma. 5. Marked fatty atrophy of the intrinsic musculature of the right foot. Electronically Signed   By: Helyn Numbers M.D.   On: 04/06/2023 00:51   DG Chest Portable 1 View  Result Date: 04/05/2023 CLINICAL DATA:  Cough EXAM: PORTABLE CHEST 1 VIEW COMPARISON:  03/27/2023 FINDINGS: Lungs are clear. No pneumothorax or pleural effusion. Moderate hiatal hernia. Cardiac size within normal limits. Pulmonary vascularity is normal. No acute bone abnormality. IMPRESSION: 1. No active disease. 2. Moderate hiatal hernia. Electronically Signed   By: Helyn Numbers M.D.   On: 04/05/2023 20:29   DG Foot Complete Right  Result  Date: 04/05/2023 CLINICAL DATA:  Right foot wounds EXAM: RIGHT FOOT COMPLETE - 3+ VIEW COMPARISON:  None Available. FINDINGS: Clawtoe deformities of the second through fifth digits. No acute fracture or dislocation. Joint spaces are preserved. 7 mm wire like foreign body is seen within the soft tissues medial and plantar to the distal tuft of the great toe. There is moderate soft tissue swelling of the right forefoot. No osseous erosions or abnormal periosteal reaction. IMPRESSION: 1. 7 mm wire like foreign body within the soft tissues medial and plantar to the distal tuft of the great toe. 2. Moderate soft tissue swelling of the right forefoot. No radiographic evidence of osteomyelitis. Electronically Signed   By: Lyda Kalata.D.  On: 04/05/2023 20:28   DG Foot Complete Left  Result Date: 04/05/2023 CLINICAL DATA:  Wound over the heel and dorsal of the foot as well as anterior shin infection. Recent discharge for sepsis. EXAM: LEFT FOOT - COMPLETE 3+ VIEW; LEFT TIBIA AND FIBULA - 2 VIEW COMPARISON:  None Available. FINDINGS: No acute fracture or dislocation. Subacute or chronic fracture of the second metatarsal with some callus formation but nonunion. The bones are osteopenic. There is diffuse subcutaneous edema. Ulceration of the skin over the plantar heel with overlying Band-Aid. No radiopaque foreign object or soft tissue gas. IMPRESSION: 1. No acute fracture or dislocation. 2. Subacute or chronic fracture of the second metatarsal with nonunion. 3. Diffuse subcutaneous edema and ulceration of the skin of the plantar heel. Electronically Signed   By: Elgie Collard M.D.   On: 04/05/2023 20:27   DG Tibia/Fibula Left  Result Date: 04/05/2023 CLINICAL DATA:  Wound over the heel and dorsal of the foot as well as anterior shin infection. Recent discharge for sepsis. EXAM: LEFT FOOT - COMPLETE 3+ VIEW; LEFT TIBIA AND FIBULA - 2 VIEW COMPARISON:  None Available. FINDINGS: No acute fracture or dislocation.  Subacute or chronic fracture of the second metatarsal with some callus formation but nonunion. The bones are osteopenic. There is diffuse subcutaneous edema. Ulceration of the skin over the plantar heel with overlying Band-Aid. No radiopaque foreign object or soft tissue gas. IMPRESSION: 1. No acute fracture or dislocation. 2. Subacute or chronic fracture of the second metatarsal with nonunion. 3. Diffuse subcutaneous edema and ulceration of the skin of the plantar heel. Electronically Signed   By: Elgie Collard M.D.   On: 04/05/2023 20:27    ROS:  Pertinent items are noted in HPI.  Blood pressure 102/61, pulse 90, temperature 98.5 F (36.9 C), temperature source Oral, resp. rate 16, height 5\' 2"  (1.575 m), weight 36.2 kg, SpO2 97%. Physical Exam:  General: Pt is laying back in hospital bed, no acute distress. Cardiovascular: RRR, no murmurs, rubs, gallops. Pulmonary: Normal work of breathing. Lungs clear to auscultation bilaterally. Extremities: 1+ bilateral lower extremity pitting edema. PT pulses present bilaterally. Both feet are wrapped heel to mid-foot. No obvious injury, infection of R great toe; see image below. Neuro/Psych: Alert and oriented to person, place, event. (Time not formerly assessed.) Normal affect.   Assessment/Plan:  Foreign Body of R Great Toe CT of R foot done 04/06/23 showed 7 mm wire like metallic foreign body within the medial soft tissues of the right great toe adjacent to the distal tuft of the distal phalanx. No obvious entry point on physical exam; unable to palpate foreign body. No signs of infection. No indication for immediate surgery at this time. However, given pt's history of DM with neuropathy, concern for future foot wounds requiring MRI evaluation which might be prevented by metallic foreign body. Will consult with podiatry colleagues to assess thoughts re: surgery to remove foreign body. - Discussing possible need for surgery to remove foreign body with  podiatry  L Heel Ulcer Pt being managed by wound care; continue same. No indication for surgical intervention, debridement at this time.  Marchelle Folks [place] 04/06/2023, 8:54 AM

## 2023-04-06 NOTE — Progress Notes (Signed)
[facility]  Discussed case with colleagues and one is podiatrist that has seen multiple people with diabetic needle pieces in their feet without signs of infection.   Recommend leaving tiny wire 6mm piece in place as there is no signs of infection or issue presently. Will likely not cause any issue given the superficial nature of the foreign body.   MRI says should not hinder any imaging in the future.   Patient just needs to be aware of the tiny piece of wire in the toe and monitor it for any changes moving forward.  No surgical intervention.   Algis Greenhouse, MD [facility] [address] Vella Raring [place], [place] [address] [phone] (office)

## 2023-04-06 NOTE — Plan of Care (Signed)

## 2023-04-06 NOTE — Progress Notes (Signed)
PROGRESS NOTE    [name]  XBJ:[id number] DOB: August 09, 1970 DOA: 04/05/2023 PCP: Elfredia Nevins, MD   Brief Narrative:  39 with history of anxiety, COPD, tobacco use, depression, DM2, HLD, HTN, hypothyroidism presents to the ED with blisters on her feet.  She was discharged on September 3 after being treated for septic shock from infected left heel wound and cellulitis.  She was given midodrine at the time of discharge and completed antibiotic course.  In the ER she has been started on broad-spectrum antibiotics due to concerns of diabetic foot ulcer.   Assessment & Plan:  Principal Problem:   Diabetic foot (HCC) Active Problems:   Essential hypertension   Tobacco abuse   Hyperlipidemia   COPD exacerbation (HCC)   DM type 2 (diabetes mellitus, type 2) (HCC)   Hypothyroid   Lactic acidosis   AKI (acute kidney injury) (HCC)   Infected right diabetic foot with foreign body Currently on broad-spectrum antibiotics vancomycin and cefepime.  General surgery input appreciated.  Recommending conservative management as this foreign object is very superficial.   AKI (acute kidney injury) (HCC) Baseline creatinine 0.9, admission creatinine 1.36.  Gentle fluids.  Slowly improving   Lactic acidosis Getting IV fluids   Hypothyroid Continue Synthroid   DM type 2 (diabetes mellitus, type 2) (HCC) Holding metformin.  Sliding scale and Accu-Cheks   COPD exacerbation (HCC) Bronchodilators   Hyperlipidemia Statin   Tobacco abuse Nicotine patch as needed  Essential hypertension -Currently on midodrine including soft blood pressures  Depression - Elavil, Abilify, BuSpar  Hypokalemia/hypomagnesemia - As needed repletion   DVT prophylaxis: heparin injection 5,000 Units Start: 04/05/23 2215 SCDs Start: 04/05/23 2209 Code Status:  Family Communication:   Continue hospital stay for at least next 24 hours for closer monitoring.   Subjective: Seen at bedside, no new  complaints.  Denies any trauma to her feet   Examination:  General exam: Appears calm and comfortable  Respiratory system: Clear to auscultation. Respiratory effort normal. Cardiovascular system: S1 & S2 heard, RRR. No JVD, murmurs, rubs, gallops or clicks. No pedal edema. Gastrointestinal system: Abdomen is nondistended, soft and nontender. No organomegaly or masses felt. Normal bowel sounds heard. Central nervous system: Alert and oriented. No focal neurological deficits. Extremities: Symmetric 5 x 5 power. Skin: No rashes, lesions or ulcers Psychiatry: Judgement and insight appear normal. Mood & affect appropriate.      Diet Orders (From admission, onward)     Start     Ordered   04/06/23 0637  Diet NPO time specified  Diet effective now        04/06/23 0636            Objective: Vitals:   04/06/23 0017 04/06/23 0333 04/06/23 0715 04/06/23 0752  BP: 120/73 120/63 102/61   Pulse: 91 98 90   Resp: 18 16 16    Temp: 98.3 F (36.8 C) 98.4 F (36.9 C) 98.5 F (36.9 C)   TempSrc: Oral  Oral   SpO2: 100% 98% 97% 97%  Weight:      Height:        Intake/Output Summary (Last 24 hours) at 04/06/2023 0824 Last data filed at 04/05/2023 2056 Gross per 24 hour  Intake 1300 ml  Output --  Net 1300 ml   Filed Weights   04/05/23 1506  Weight: 36.2 kg    Scheduled Meds:  amitriptyline  100 mg Oral QHS   ARIPiprazole  10 mg Oral Daily   atorvastatin  20 mg  Oral Daily   busPIRone  15 mg Oral BID   DULoxetine  60 mg Oral Daily   gabapentin  300 mg Oral TID   heparin  5,000 Units Subcutaneous Q8H   insulin aspart  0-15 Units Subcutaneous TID WC   insulin aspart  0-5 Units Subcutaneous QHS   levothyroxine  25 mcg Oral QAC breakfast   midodrine  5 mg Oral TID WC   mometasone-formoterol  2 puff Inhalation BID   morphine  15 mg Oral BID   nicotine  21 mg Transdermal Daily   pantoprazole  40 mg Oral BID   Tdap  0.5 mL Intramuscular Once   Continuous Infusions:  ceFEPime  (MAXIPIME) IV Stopped (04/05/23 2040)   [START ON 04/07/2023] vancomycin      Nutritional status     Body mass index is 14.6 kg/m.  Data Reviewed:   CBC: Recent Labs  Lab 04/05/23 1705 04/06/23 0350  WBC 10.6* 6.6  NEUTROABS 7.0 3.6  HGB 12.3 9.4*  HCT 38.6 28.5*  MCV 101.0* 100.7*  PLT 327 248   Basic Metabolic Panel: Recent Labs  Lab 04/05/23 1705 04/06/23 0350  NA 134* 139  K 3.7 3.4*  CL 97* 105  CO2 27 23  GLUCOSE 152* 79  BUN 27* 23*  CREATININE 1.36* 1.10*  CALCIUM 7.9* 7.2*  MG  --  1.1*   GFR: Estimated Creatinine Clearance: 33.4 mL/min (A) (by C-G formula based on SCr of 1.1 mg/dL (H)). Liver Function Tests: Recent Labs  Lab 04/05/23 1705 04/06/23 0350  AST 20 16  ALT 22 14  ALKPHOS 114 74  BILITOT 0.5 0.3  PROT 6.9 4.8*  ALBUMIN 3.1* 2.1*   No results for input(s): "LIPASE", "AMYLASE" in the last 168 hours. No results for input(s): "AMMONIA" in the last 168 hours. Coagulation Profile: No results for input(s): "INR", "PROTIME" in the last 168 hours. Cardiac Enzymes: No results for input(s): "CKTOTAL", "CKMB", "CKMBINDEX", "TROPONINI" in the last 168 hours. BNP (last 3 results) No results for input(s): "PROBNP" in the last 8760 hours. HbA1C: No results for input(s): "HGBA1C" in the last 72 hours. CBG: Recent Labs  Lab 03/30/23 0825 04/05/23 1752 04/05/23 2219 04/06/23 0714  GLUCAP 95 125* 196* 73   Lipid Profile: No results for input(s): "CHOL", "HDL", "LDLCALC", "TRIG", "CHOLHDL", "LDLDIRECT" in the last 72 hours. Thyroid Function Tests: Recent Labs    04/06/23 0350  TSH 2.177   Anemia Panel: No results for input(s): "VITAMINB12", "FOLATE", "FERRITIN", "TIBC", "IRON", "RETICCTPCT" in the last 72 hours. Sepsis Labs: Recent Labs  Lab 04/05/23 1720 04/05/23 2124  PROCALCITON  --  <0.10  LATICACIDVEN 2.9* 3.3*    Recent Results (from the past 240 hour(s))  SARS Coronavirus 2 by RT PCR (hospital order, performed in [facility] hospital lab) *cepheid single result test* Anterior Nasal Swab     Status: None   Collection Time: 03/27/23  3:33 PM   Specimen: Anterior Nasal Swab  Result Value Ref Range Status   SARS Coronavirus 2 by RT PCR NEGATIVE NEGATIVE Final    Comment: (NOTE) SARS-CoV-2 target nucleic acids are NOT DETECTED.  The SARS-CoV-2 RNA is generally detectable in upper and lower respiratory specimens during the acute phase of infection. The lowest concentration of SARS-CoV-2 viral copies this assay can detect is 250 copies / mL. A negative result does not preclude SARS-CoV-2 infection and should not be used as the sole basis for treatment or other patient management decisions.  A negative result  may occur with improper specimen collection / handling, submission of specimen other than nasopharyngeal swab, presence of viral mutation(s) within the areas targeted by this assay, and inadequate number of viral copies (<250 copies / mL). A negative result must be combined with clinical observations, patient history, and epidemiological information.  Fact Sheet for Patients:   RoadLapTop.co.za  Fact Sheet for Healthcare Providers: http://kim-miller.com/  This test is not yet approved or  cleared by the [place] FDA and has been authorized for detection and/or diagnosis of SARS-CoV-2 by FDA under an Emergency Use Authorization (EUA).  This EUA will remain in effect (meaning this test can be used) for the duration of the COVID-19 declaration under Section 564(b)(1) of the Act, 21 U.S.C. section 360bbb-3(b)(1), unless the authorization is terminated or revoked sooner.  Performed at [facility], [address] [place], [place] [address]   Blood Culture (routine x 2)     Status: None   Collection Time: 03/27/23  3:46 PM   Specimen: BLOOD  Result Value Ref Range Status   Specimen Description BLOOD RIGHT ANTECUBITAL  Final   Special Requests   Final     BOTTLES DRAWN AEROBIC AND ANAEROBIC Blood Culture adequate volume   Culture   Final    NO GROWTH 5 DAYS Performed at [facility], [address] [place], [place] [address]    Report Status 04/01/2023 FINAL  Final  Blood Culture (routine x 2)     Status: None   Collection Time: 03/27/23  4:28 PM   Specimen: Left Antecubital; Blood  Result Value Ref Range Status   Specimen Description LEFT ANTECUBITAL BLOOD  Final   Special Requests   Final    BOTTLES DRAWN AEROBIC AND ANAEROBIC Blood Culture adequate volume   Culture   Final    NO GROWTH 5 DAYS Performed at [facility], [address] [place], [place] [address]    Report Status 04/01/2023 FINAL  Final  MRSA Next Gen by PCR, Nasal     Status: None   Collection Time: 03/27/23 11:15 PM   Specimen: Nasal Mucosa; Nasal Swab  Result Value Ref Range Status   MRSA by PCR Next Gen NOT DETECTED NOT DETECTED Final    Comment: (NOTE) The GeneXpert MRSA Assay (FDA approved for NASAL specimens only), is one component of a comprehensive MRSA colonization surveillance program. It is not intended to diagnose MRSA infection nor to guide or monitor treatment for MRSA infections. Test performance is not FDA approved in patients less than 24 years old. Performed at [facility], [address], [place], [place] [address]   Blood culture (routine x 2)     Status: None (Preliminary result)   Collection Time: 04/05/23  5:20 PM   Specimen: Right Antecubital; Blood  Result Value Ref Range Status   Specimen Description RIGHT ANTECUBITAL  Final   Special Requests   Final    BOTTLES DRAWN AEROBIC ONLY Blood Culture adequate volume Performed at [facility], [address] [place], [place] [address]    Culture PENDING  Incomplete   Report Status PENDING  Incomplete  Blood culture (routine x 2)     Status: None (Preliminary result)   Collection Time: 04/05/23  5:44 PM   Specimen: BLOOD  Result Value Ref Range Status   Specimen Description  BLOOD BLOOD LEFT ARM  Final   Special Requests   Final    BOTTLES DRAWN AEROBIC AND ANAEROBIC Blood Culture results may not be optimal due to  an excessive volume of blood received in culture bottles Performed at [facility], [address] [place], [place] [address]    Culture PENDING  Incomplete   Report Status PENDING  Incomplete         Radiology Studies: CT FOOT RIGHT WO CONTRAST  Result Date: 04/06/2023 CLINICAL DATA:  Right foot wound, right foot pain EXAM: CT OF THE RIGHT FOOT WITHOUT CONTRAST TECHNIQUE: Multidetector CT imaging of the right foot was performed according to the standard protocol. Multiplanar CT image reconstructions were also generated. RADIATION DOSE REDUCTION: This exam was performed according to the departmental dose-optimization program which includes automated exposure control, adjustment of the mA and/or kV according to patient size and/or use of iterative reconstruction technique. COMPARISON:  Plain radiographs obtained 04/05/2023 FINDINGS: Bones/Joint/Cartilage Clawtoe deformities of the second through fifth digits. No acute fracture or dislocation. Joint spaces are preserved. No osseous erosions or abnormal periosteal reaction. Ligaments Suboptimally assessed by CT. Muscles and Tendons There is marked fatty atrophy of the intrinsic musculature of the right foot. Visualized tendinous structures are unremarkable. Soft tissues A 7 mm wire like metallic foreign body is seen within the medial soft tissues of the right great toe adjacent to the distal tuft of the distal phalanx (image # 18/7). Mild diffuse subcutaneous edema of the right foot. A loculated intradermal fluid collection is seen within the lateral hindfoot measuring 1.6 x 4.7 x 4.5 cm. IMPRESSION: 1. No acute fracture or dislocation. No CT evidence of osteomyelitis. 2. 7 mm wire like metallic foreign body within the medial soft tissues of the right great toe adjacent to the distal tuft of the distal phalanx. 3.  Mild diffuse subcutaneous edema of the right foot. 4. 1.6 x 4.7 x 4.5 cm loculated intradermal fluid collection within the lateral hindfoot. This may represent a cyst or seroma. 5. Marked fatty atrophy of the intrinsic musculature of the right foot. Electronically Signed   By: Helyn Numbers M.D.   On: 04/06/2023 00:51   DG Chest Portable 1 View  Result Date: 04/05/2023 CLINICAL DATA:  Cough EXAM: PORTABLE CHEST 1 VIEW COMPARISON:  03/27/2023 FINDINGS: Lungs are clear. No pneumothorax or pleural effusion. Moderate hiatal hernia. Cardiac size within normal limits. Pulmonary vascularity is normal. No acute bone abnormality. IMPRESSION: 1. No active disease. 2. Moderate hiatal hernia. Electronically Signed   By: Helyn Numbers M.D.   On: 04/05/2023 20:29   DG Foot Complete Right  Result Date: 04/05/2023 CLINICAL DATA:  Right foot wounds EXAM: RIGHT FOOT COMPLETE - 3+ VIEW COMPARISON:  None Available. FINDINGS: Clawtoe deformities of the second through fifth digits. No acute fracture or dislocation. Joint spaces are preserved. 7 mm wire like foreign body is seen within the soft tissues medial and plantar to the distal tuft of the great toe. There is moderate soft tissue swelling of the right forefoot. No osseous erosions or abnormal periosteal reaction. IMPRESSION: 1. 7 mm wire like foreign body within the soft tissues medial and plantar to the distal tuft of the great toe. 2. Moderate soft tissue swelling of the right forefoot. No radiographic evidence of osteomyelitis. Electronically Signed   By: Helyn Numbers M.D.   On: 04/05/2023 20:28   DG Foot Complete Left  Result Date: 04/05/2023 CLINICAL DATA:  Wound over the heel and dorsal of the foot as well as anterior shin infection. Recent discharge for sepsis. EXAM: LEFT FOOT - COMPLETE 3+ VIEW; LEFT TIBIA AND FIBULA - 2 VIEW COMPARISON:  None Available. FINDINGS: No  acute fracture or dislocation. Subacute or chronic fracture of the second metatarsal with some  callus formation but nonunion. The bones are osteopenic. There is diffuse subcutaneous edema. Ulceration of the skin over the plantar heel with overlying Band-Aid. No radiopaque foreign object or soft tissue gas. IMPRESSION: 1. No acute fracture or dislocation. 2. Subacute or chronic fracture of the second metatarsal with nonunion. 3. Diffuse subcutaneous edema and ulceration of the skin of the plantar heel. Electronically Signed   By: Elgie Collard M.D.   On: 04/05/2023 20:27   DG Tibia/Fibula Left  Result Date: 04/05/2023 CLINICAL DATA:  Wound over the heel and dorsal of the foot as well as anterior shin infection. Recent discharge for sepsis. EXAM: LEFT FOOT - COMPLETE 3+ VIEW; LEFT TIBIA AND FIBULA - 2 VIEW COMPARISON:  None Available. FINDINGS: No acute fracture or dislocation. Subacute or chronic fracture of the second metatarsal with some callus formation but nonunion. The bones are osteopenic. There is diffuse subcutaneous edema. Ulceration of the skin over the plantar heel with overlying Band-Aid. No radiopaque foreign object or soft tissue gas. IMPRESSION: 1. No acute fracture or dislocation. 2. Subacute or chronic fracture of the second metatarsal with nonunion. 3. Diffuse subcutaneous edema and ulceration of the skin of the plantar heel. Electronically Signed   By: Elgie Collard M.D.   On: 04/05/2023 20:27           LOS: 0 days   Time spent= 35 mins    Miguel Rota, MD [facility]  If 7PM-7AM, please contact night-coverage  04/06/2023, 8:24 AM

## 2023-04-06 NOTE — Consult Note (Addendum)
WOC Nurse Consult Note: patient with neuropathy, states she has frequent blisters on bilateral feet  Reason for Consult: blisters R foot, L heel ulcer  Wound type: Full thickness  Pressure Injury POA: NA, not related to pressure  Measurement: 1.  R lateral heel intact serum filled bulla 5 cm x 6 cm 2.  R 5th toe lateral aspect intact serum filled bulla 1 cm x 2 cm  3.  L heel full thickness, patient states started as a blister; now skin sloughing off to reveal deep tissue purple maroon discoloration   Drainage (amount, consistency, odor) minimal serosanguinous L heel  Periwound: maceration noted of skin surrounding L heel  Dressing procedure/placement/frequency:  Clean R lateral heel and R 5th toe lateral aspect bullae with NS apply Xeroform gauze [place] [phone]) cut to fit blister daily.  Cover with dry gauze and wrap with Kerlix roll gauze. May secure with Ace wrap if desired.  Clean L heel wound with NS, apply Xeroform gauze [place] [phone]) cut to fit wound bed daily, cover with dry gauze and wrap with Kerlix beginning just above toes and ending above ankle.  May secure with Ace wrap if desired wrapped in same fashion as Kerlix.    POC discussed with patient and bedside nurse.  Patient is awaiting surgical consult for foreign body in R great toe, erythema noted but no open wound to this area.  Patient unaware of any issue until x-rays revealed this.   WOC team will not follow.  Re-consult if further needs arise.   Thank you,    Priscella Mann MSN, RN-BC, [employer] [phone]

## 2023-04-06 NOTE — Progress Notes (Signed)
Initial Nutrition Assessment  DOCUMENTATION CODES:   Non-severe (moderate) malnutrition in context of chronic illness  INTERVENTION:   Glucerna Shake po TID, each supplement provides 220 kcal and 10 grams of protein MVI with minerals daily  NUTRITION DIAGNOSIS:   Moderate Malnutrition related to chronic illness (COPD) as evidenced by mild muscle depletion, moderate muscle depletion, mild fat depletion, moderate fat depletion.  GOAL:   Patient will meet greater than or equal to 90% of their needs  MONITOR:   PO intake, Supplement acceptance, Skin  REASON FOR ASSESSMENT:   Consult Wound healing  ASSESSMENT:   52 yo female admitted with blisters to bilateral feet. PMH includes COPD, tobacco abuse, DM-2, HLD, hypothyroidism, asthma, bipolar disorder, chronic abdominal pain, diabetic neuropathy, chronic pancreatitis.  Recent admission for infected L heel wound, discharged 9/3. This admission, a tiny wire was found in patient's R great toe with plans to leave it in place and monitor for any changes.   Patient reports good intake of meals. Lunch meal at bedside ~25% complete. Patient states this is her usual intake. Discussed the importance of adequate protein and calorie intake to promote wound healing and recovery. She agreed to try Glucerna shake supplements to maximize oral intake. Will also add MVI daily to promote healing.   Labs reviewed. K 3.4, Mag 1.1 CBG: 73-198  Medications reviewed and include Novolog SSI TID with meals, Protonix, IV potassium chloride, IV mag sulfate, IV antibiotics. IVF: D5 1/2 NS at 75 ml/h.   Weight history reviewed. Patient states her weight has been fairly stable over the past 9-10 months. She thinks she has lost ~5 lbs since December 2023. Over the past 10 years she has lost 50-60 lbs.   NUTRITION - FOCUSED PHYSICAL EXAM:  Flowsheet Row Most Recent Value  Orbital Region Mild depletion  Upper Arm Region Moderate depletion  Thoracic and  Lumbar Region Moderate depletion  Buccal Region Mild depletion  Temple Region Moderate depletion  Clavicle Bone Region Moderate depletion  Clavicle and Acromion Bone Region Moderate depletion  Scapular Bone Region Mild depletion  Dorsal Hand Moderate depletion  Patellar Region Mild depletion  Anterior Thigh Region Mild depletion  Posterior Calf Region Unable to assess  Edema (RD Assessment) Mild  Hair Reviewed  Eyes Reviewed  Mouth Reviewed  Skin Reviewed  Nails Reviewed       Diet Order:   Diet Order             Diet Carb Modified Fluid consistency: Thin; Room service appropriate? Yes  Diet effective now                   EDUCATION NEEDS:   Education needs have been addressed  Skin:  Skin Assessment: Skin Integrity Issues: Skin Integrity Issues:: Other (Comment) Other: R heel blister; L heel wound; foreign body R great toe  Last BM:  9/8  Height:   Ht Readings from Last 1 Encounters:  04/05/23 5\' 2"  (1.575 m)    Weight:   Wt Readings from Last 1 Encounters:  04/06/23 50.4 kg    Ideal Body Weight:  50 kg  BMI:  Body mass index is 20.32 kg/m.  Estimated Nutritional Needs:   Kcal:  1300-1500  Protein:  60-70 gm  Fluid:  >/= 1.5 L   Gabriel Rainwater RD, LDN, CNSC Please refer to Amion for contact information.

## 2023-04-06 NOTE — Progress Notes (Addendum)
   04/06/23 1028  TOC Brief Assessment  Insurance and Status Reviewed  Patient has primary care physician Yes  Home environment has been reviewed Home  Prior level of function: Independent  Prior/Current Home Services No current home services  Readmission risk has been reviewed Yes  Transition of care needs no transition of care needs at this time   Referred to [facility] home health last admission. TOC following for discharge needs.   Transition of Care Department [facility]) has reviewed patient and no TOC needs have been identified at this time. We will continue to monitor patient advancement through interdisciplinary progression rounds. If new patient transition needs arise, please place a TOC consult.

## 2023-04-06 NOTE — Progress Notes (Signed)
Mobility Specialist Progress Note:    04/06/23 1000  Mobility  Activity Ambulated with assistance in hallway;Ambulated with assistance to bathroom  Level of Assistance Contact guard assist, steadying assist  Assistive Device Other (Comment) (IV pole)  Distance Ambulated (ft) 20 ft  Range of Motion/Exercises Active;All extremities  Activity Response Tolerated well  Mobility Referral Yes  $Mobility charge 1 Mobility  Mobility Specialist Start Time (ACUTE ONLY) 1000  Mobility Specialist Stop Time (ACUTE ONLY) 1015  Mobility Specialist Time Calculation (min) (ACUTE ONLY) 15 min   Pt received sitting EOB, agreeable to mobility. Required CGA to stand and ambulate with Modi using IV pole. Tolerated well, c/o weakness. Returned to room, pt requested to sit EOB. All needs met.   Lawerance Bach Mobility Specialist Please contact via Special educational needs teacher or  Rehab office at [phone]

## 2023-04-06 NOTE — Progress Notes (Signed)
OT Cancellation Note  Patient Details Name: [name] MRN: [id number] DOB: 1970/08/16   Cancelled Treatment:    Reason Eval/Treat Not Completed: OT screened, no needs identified, will sign off. Pt screened for OT needs. Pt is at/near baseline with ADLs and mobility. Discussed looking into grab bars for the shower and a shower seat. No further OT needs at this time.    Ezra Sites, OTR/L  [phone] 04/06/2023, 11:30 AM

## 2023-04-06 NOTE — Evaluation (Signed)
Physical Therapy Evaluation Patient Details Name: [name] MRN: [id number] DOB: [birth year] Today's Date: 04/06/2023  History of Present Illness  [name] is a 52 y.o. female with medical history significant of anxiety, COPD, tobacco use disorder, depression, diabetes mellitus type 2, hyperlipidemia, hypothyroidism, and more presents to the ED with a chief complaint of blisters on her feet.  Of note patient was recently discharged on 3 September.  At that time she had combined septic and hypovolemic shock due to infected left heel wound and surrounding cellulitis.  She was discharged on midodrine.  It is noted the erythema was almost resolved at discharge.  She continued a course of Keflex at discharge.  That course finished today.  Patient reports worsening erythema on her left leg worsening pain in her right.  She also has new blisters on her right lateral aspect of her foot.  She reports that the blisters have been slowly leaking clear fluid.  She reports intact appetite, no measured fever.  She reports she has been staying chilled which is new for her.  Patient reports that she has noticed the erythema moving up her leg towards her knee.  This is on the left side.  She reports all the symptoms started 10 days ago, never really resolved, then started getting worse when she got out of the hospital.  Patient has no other complaints at this time.   Clinical Impression  Patient functioning near baseline for functional mobility and gait demonstrating good return for bed mobility, transfers and ambulating in room/hallway using RW.  Patient limited for ambulation mostly due to pain in feet, otherwise  able to ambulate safely in room.  Plan:  Patient discharged from physical therapy to care of nursing for ambulation daily as tolerated for length of stay.          If plan is discharge home, recommend the following: Help with stairs or ramp for entrance   Can travel by private vehicle         Equipment Recommendations None recommended by PT  Recommendations for Other Services       Functional Status Assessment Patient has had a recent decline in their functional status and/or demonstrates limited ability to make significant improvements in function in a reasonable and predictable amount of time     Precautions / Restrictions Precautions Precautions: Fall Restrictions Weight Bearing Restrictions: No      Mobility  Bed Mobility Overal bed mobility: Independent                  Transfers Overall transfer level: Modified independent                      Ambulation/Gait Ambulation/Gait assistance: Modified independent (Device/Increase time) Gait Distance (Feet): 60 Feet Assistive device: Rolling walker (2 wheels) Gait Pattern/deviations: Decreased step length - right, Decreased step length - left, Decreased stride length Gait velocity: decreased     General Gait Details: slightly labored cadence with good return for ambulating in room/hallway without loss of balance, limited mostly due to pain in feet  Stairs            Wheelchair Mobility     Tilt Bed    Modified Rankin (Stroke Patients Only)       Balance Overall balance assessment: Needs assistance Sitting-balance support: Feet supported, No upper extremity supported Sitting balance-Leahy Scale: Good Sitting balance - Comments: seated at EOB   Standing balance support: During functional activity, Bilateral upper  extremity supported Standing balance-Leahy Scale: Good Standing balance comment: using RW                             Pertinent Vitals/Pain Pain Assessment Pain Assessment: 0-10 Pain Score: 7  Pain Location: bilateral feet Pain Descriptors / Indicators: Sore, Burning, Discomfort Pain Intervention(s): Limited activity within patient's tolerance, Monitored during session, Repositioned    Home Living Family/patient expects to be discharged to::  Private residence Living Arrangements: Alone Available Help at Discharge: Family;Available PRN/intermittently Type of Home: House Home Access: Stairs to enter Entrance Stairs-Rails: Doctor, general practice of Steps: 2   Home Layout: One level Home Equipment: Agricultural consultant (2 wheels)      Prior Function Prior Level of Function : Driving;Independent/Modified Independent             Mobility Comments: household and short distanced ambulator using RW PRN ADLs Comments: Independent for household ADLs, assisted for community ADLs     Extremity/Trunk Assessment   Upper Extremity Assessment Upper Extremity Assessment: Overall WFL for tasks assessed    Lower Extremity Assessment Lower Extremity Assessment: Generalized weakness    Cervical / Trunk Assessment Cervical / Trunk Assessment: Normal  Communication   Communication Communication: No apparent difficulties  Cognition Arousal: Alert Behavior During Therapy: WFL for tasks assessed/performed Overall Cognitive Status: Within Functional Limits for tasks assessed                                          General Comments      Exercises     Assessment/Plan    PT Assessment All further PT needs can be met in the next venue of care  PT Problem List Decreased strength;Decreased activity tolerance;Decreased balance;Decreased mobility       PT Treatment Interventions      PT Goals (Current goals can be found in the Care Plan section)  Acute Rehab PT Goals Patient Stated Goal: return home with family to assist PT Goal Formulation: With patient Time For Goal Achievement: 04/06/23 Potential to Achieve Goals: Good    Frequency       Co-evaluation               AM-PAC PT "6 Clicks" Mobility  Outcome Measure Help needed turning from your back to your side while in a flat bed without using bedrails?: None Help needed moving from lying on your back to sitting on the side of a flat  bed without using bedrails?: None Help needed moving to and from a bed to a chair (including a wheelchair)?: None Help needed standing up from a chair using your arms (e.g., wheelchair or bedside chair)?: None Help needed to walk in hospital room?: None Help needed climbing 3-5 steps with a railing? : A Little 6 Click Score: 23    End of Session   Activity Tolerance: Patient tolerated treatment well;Patient limited by fatigue;Patient limited by pain Patient left: in bed;with call bell/phone within reach Nurse Communication: Mobility status PT Visit Diagnosis: Unsteadiness on feet (R26.81);Other abnormalities of gait and mobility (R26.89);Muscle weakness (generalized) (M62.81)    Time: 0981-1914 PT Time Calculation (min) (ACUTE ONLY): 16 min   Charges:   PT Evaluation $PT Eval Low Complexity: 1 Low PT Treatments $Therapeutic Activity: 8-22 mins PT General Charges $$ ACUTE PT VISIT: 1 Visit  12:15 PM, 04/06/23 Ocie Bob, MPT Physical Therapist with [facility] [phone] office [phone] mobile phone

## 2023-04-07 DIAGNOSIS — S90454A Superficial foreign body, right lesser toe(s), initial encounter: Secondary | ICD-10-CM | POA: Diagnosis not present

## 2023-04-07 DIAGNOSIS — Z7984 Long term (current) use of oral hypoglycemic drugs: Secondary | ICD-10-CM | POA: Diagnosis not present

## 2023-04-07 DIAGNOSIS — E118 Type 2 diabetes mellitus with unspecified complications: Secondary | ICD-10-CM | POA: Diagnosis not present

## 2023-04-07 LAB — CBC
HCT: 30.7 % — ABNORMAL LOW (ref 36.0–46.0)
Hemoglobin: 10 g/dL — ABNORMAL LOW (ref 12.0–15.0)
MCH: 32.7 pg (ref 26.0–34.0)
MCHC: 32.6 g/dL (ref 30.0–36.0)
MCV: 100.3 fL — ABNORMAL HIGH (ref 80.0–100.0)
Platelets: 265 10*3/uL (ref 150–400)
RBC: 3.06 MIL/uL — ABNORMAL LOW (ref 3.87–5.11)
RDW: 13.3 % (ref 11.5–15.5)
WBC: 5.8 10*3/uL (ref 4.0–10.5)
nRBC: 0 % (ref 0.0–0.2)

## 2023-04-07 LAB — BASIC METABOLIC PANEL
Anion gap: 7 (ref 5–15)
BUN: 13 mg/dL (ref 6–20)
CO2: 28 mmol/L (ref 22–32)
Calcium: 8.1 mg/dL — ABNORMAL LOW (ref 8.9–10.3)
Chloride: 103 mmol/L (ref 98–111)
Creatinine, Ser: 0.79 mg/dL (ref 0.44–1.00)
GFR, Estimated: >60 mL/min (ref >60)
Glucose, Bld: 121 mg/dL — ABNORMAL HIGH (ref 70–99)
Potassium: 4.3 mmol/L (ref 3.5–5.1)
Sodium: 138 mmol/L (ref 135–145)

## 2023-04-07 LAB — GLUCOSE, CAPILLARY
Glucose-Capillary: 96 mg/dL (ref 70–99)
Glucose-Capillary: 175 mg/dL — ABNORMAL HIGH (ref 70–99)

## 2023-04-07 LAB — PHOSPHORUS: Phosphorus: 3.1 mg/dL (ref 2.5–4.6)

## 2023-04-07 LAB — MAGNESIUM: Magnesium: 1.7 mg/dL (ref 1.7–2.4)

## 2023-04-07 MED ORDER — DOXYCYCLINE HYCLATE 50 MG PO CAPS: 50.0000 mg | ORAL_CAPSULE | Freq: Two times a day (BID) | ORAL | 0 refills | Status: AC

## 2023-04-07 MED ORDER — MIDODRINE HCL 5 MG PO TABS: 5.0000 mg | ORAL_TABLET | Freq: Three times a day (TID) | ORAL | 0 refills | Status: AC

## 2023-04-07 MED ORDER — LACTINEX PO CHEW: 1.0000 | CHEWABLE_TABLET | Freq: Three times a day (TID) | ORAL | 0 refills | Status: AC

## 2023-04-07 NOTE — Care Management Important Message (Signed)
Important Message  Patient Details  Name: [name] MRN: [id number] Date of Birth: 11-24-1970   Medicare Important Message Given:  N/A - LOS <3 / Initial given by admissions     Corey Harold 04/07/2023, 10:21 AM

## 2023-04-07 NOTE — Progress Notes (Signed)
Mobility Specialist Progress Note:    04/07/23 1100  Mobility  Activity Ambulated with assistance in hallway  Level of Assistance Contact guard assist, steadying assist  Assistive Device None  Distance Ambulated (ft) 50 ft  Range of Motion/Exercises Active;All extremities  Activity Response Tolerated well  Mobility Referral Yes  $Mobility charge 1 Mobility  Mobility Specialist Start Time (ACUTE ONLY) 1100  Mobility Specialist Stop Time (ACUTE ONLY) 1110  Mobility Specialist Time Calculation (min) (ACUTE ONLY) 10 min   Pt received in chair, agreeable to mobility. Required SBA to stand and CGA to ambulate. Tolerated well, asx throughout. Returned pt sitting EOB, all needs met.   Lawerance Bach Mobility Specialist Please contact via Special educational needs teacher or  Rehab office at [phone]

## 2023-04-07 NOTE — Progress Notes (Signed)
Subjective: Pt reports feeling well overall this morning. She still has some pain in her bilateral feet, but reports pain in bilateral lower legs has improved. She denies pain in the region of her R great toe. Feet remain wrapped and she is being following by wound care. She denies fevers, chills, SOB, chest pain. No other concerns this morning.   She does report getting new shoes shortly before starting to experience blisters of her feet.  Objective: Vital signs in last 24 hours: Temp:  [98.2 F (36.8 C)-98.3 F (36.8 C)] 98.2 F (36.8 C) (09/11 0409) Pulse Rate:  [79-88] 79 (09/11 0409) Resp:  [12-18] 18 (09/11 0409) BP: (107-115)/(63-68) 114/68 (09/11 0409) SpO2:  [94 %-100 %] 100 % (09/11 0718) Weight:  [50.4 kg] 50.4 kg (09/10 1300) Last BM Date : 04/04/23  Intake/Output from previous day: 09/10 0701 - 09/11 0700 In: 2762.8 [P.O.:1200; I.V.:1156.1; IV Piggyback:406.7] Out: -  Intake/Output this shift: No intake/output data recorded.  Physical Exam: General: Pt is sitting up in bed, eating breakfast; no acute distress. Cardiovascular: RRR, no murmurs, rubs, gallops. Pulmonary: Normal work of breathing. Lungs clear to auscultation bilaterally. Extremities: No pain to palpation of R great toe; no redness, warmth, signs of infection. Trace to 1+ edema of bilateral lower extremities, improved from yesterday. Bilateral feet bandaged heel to mid-foot. Neuro/Psych: Alert and oriented to person, place, event. (Time not formerly assessed.) Normal affect.  Lab Results:  Recent Labs    04/06/23 0350 04/07/23 0406  WBC 6.6 5.8  HGB 9.4* 10.0*  HCT 28.5* 30.7*  PLT 248 265   BMET Recent Labs    04/06/23 0350 04/07/23 0406  NA 139 138  K 3.4* 4.3  CL 105 103  CO2 23 28  GLUCOSE 79 121*  BUN 23* 13  CREATININE 1.10* 0.79  CALCIUM 7.2* 8.1*   PT/INR No results for input(s): "LABPROT", "INR" in the last 72 hours.  Studies/Results: US ARTERIAL ABI (SCREENING LOWER  EXTREMITY)  Result Date: 04/06/2023 CLINICAL DATA:  Diabetic foot wounds EXAM: NONINVASIVE PHYSIOLOGIC VASCULAR STUDY OF BILATERAL LOWER EXTREMITIES TECHNIQUE: Evaluation of both lower extremities were performed at rest, including calculation of ankle-brachial indices with single level Doppler, pressure and pulse volume recording. COMPARISON:  None Available. FINDINGS: Right ABI:  1.1 Left ABI:  1.2 Right Lower Extremity:  Abnormal monophasic arterial waveforms. Left Lower Extremity:  Abnormal monophasic arterial waveforms. 1.0-1.4 Normal IMPRESSION: 1. Presenting ankle-brachial indices are normal bilaterally. However, the arterial waveforms are abnormal and monophasic. The ABIs may be falsely elevated due to poor compressibility of the arteries at the ankle in the setting of medial arteriosclerosis. If further evaluation is clinically warranted, CT arteriogram with bilateral lower extremity runoff could further evaluate. Electronically Signed   By: Malachy Moan M.D.   On: 04/06/2023 11:34   CT FOOT RIGHT WO CONTRAST  Result Date: 04/06/2023 CLINICAL DATA:  Right foot wound, right foot pain EXAM: CT OF THE RIGHT FOOT WITHOUT CONTRAST TECHNIQUE: Multidetector CT imaging of the right foot was performed according to the standard protocol. Multiplanar CT image reconstructions were also generated. RADIATION DOSE REDUCTION: This exam was performed according to the departmental dose-optimization program which includes automated exposure control, adjustment of the mA and/or kV according to patient size and/or use of iterative reconstruction technique. COMPARISON:  Plain radiographs obtained 04/05/2023 FINDINGS: Bones/Joint/Cartilage Clawtoe deformities of the second through fifth digits. No acute fracture or dislocation. Joint spaces are preserved. No osseous erosions or abnormal periosteal reaction. Ligaments Suboptimally assessed  by CT. Muscles and Tendons There is marked fatty atrophy of the intrinsic  musculature of the right foot. Visualized tendinous structures are unremarkable. Soft tissues A 7 mm wire like metallic foreign body is seen within the medial soft tissues of the right great toe adjacent to the distal tuft of the distal phalanx (image # [id number]). Mild diffuse subcutaneous edema of the right foot. A loculated intradermal fluid collection is seen within the lateral hindfoot measuring 1.6 x 4.7 x 4.5 cm. IMPRESSION: 1. No acute fracture or dislocation. No CT evidence of osteomyelitis. 2. 7 mm wire like metallic foreign body within the medial soft tissues of the right great toe adjacent to the distal tuft of the distal phalanx. 3. Mild diffuse subcutaneous edema of the right foot. 4. 1.6 x 4.7 x 4.5 cm loculated intradermal fluid collection within the lateral hindfoot. This may represent a cyst or seroma. 5. Marked fatty atrophy of the intrinsic musculature of the right foot. Electronically Signed   By: Helyn Numbers M.D.   On: 04/06/2023 00:51   DG Chest Portable 1 View  Result Date: 04/05/2023 CLINICAL DATA:  Cough EXAM: PORTABLE CHEST 1 VIEW COMPARISON:  03/27/2023 FINDINGS: Lungs are clear. No pneumothorax or pleural effusion. Moderate hiatal hernia. Cardiac size within normal limits. Pulmonary vascularity is normal. No acute bone abnormality. IMPRESSION: 1. No active disease. 2. Moderate hiatal hernia. Electronically Signed   By: Helyn Numbers M.D.   On: 04/05/2023 20:29   DG Foot Complete Right  Result Date: 04/05/2023 CLINICAL DATA:  Right foot wounds EXAM: RIGHT FOOT COMPLETE - 3+ VIEW COMPARISON:  None Available. FINDINGS: Clawtoe deformities of the second through fifth digits. No acute fracture or dislocation. Joint spaces are preserved. 7 mm wire like foreign body is seen within the soft tissues medial and plantar to the distal tuft of the great toe. There is moderate soft tissue swelling of the right forefoot. No osseous erosions or abnormal periosteal reaction. IMPRESSION: 1. 7 mm wire  like foreign body within the soft tissues medial and plantar to the distal tuft of the great toe. 2. Moderate soft tissue swelling of the right forefoot. No radiographic evidence of osteomyelitis. Electronically Signed   By: Helyn Numbers M.D.   On: 04/05/2023 20:28   DG Foot Complete Left  Result Date: 04/05/2023 CLINICAL DATA:  Wound over the heel and dorsal of the foot as well as anterior shin infection. Recent discharge for sepsis. EXAM: LEFT FOOT - COMPLETE 3+ VIEW; LEFT TIBIA AND FIBULA - 2 VIEW COMPARISON:  None Available. FINDINGS: No acute fracture or dislocation. Subacute or chronic fracture of the second metatarsal with some callus formation but nonunion. The bones are osteopenic. There is diffuse subcutaneous edema. Ulceration of the skin over the plantar heel with overlying Band-Aid. No radiopaque foreign object or soft tissue gas. IMPRESSION: 1. No acute fracture or dislocation. 2. Subacute or chronic fracture of the second metatarsal with nonunion. 3. Diffuse subcutaneous edema and ulceration of the skin of the plantar heel. Electronically Signed   By: Elgie Collard M.D.   On: 04/05/2023 20:27   DG Tibia/Fibula Left  Result Date: 04/05/2023 CLINICAL DATA:  Wound over the heel and dorsal of the foot as well as anterior shin infection. Recent discharge for sepsis. EXAM: LEFT FOOT - COMPLETE 3+ VIEW; LEFT TIBIA AND FIBULA - 2 VIEW COMPARISON:  None Available. FINDINGS: No acute fracture or dislocation. Subacute or chronic fracture of the second metatarsal with some callus formation but nonunion.  The bones are osteopenic. There is diffuse subcutaneous edema. Ulceration of the skin over the plantar heel with overlying Band-Aid. No radiopaque foreign object or soft tissue gas. IMPRESSION: 1. No acute fracture or dislocation. 2. Subacute or chronic fracture of the second metatarsal with nonunion. 3. Diffuse subcutaneous edema and ulceration of the skin of the plantar heel. Electronically Signed    By: Elgie Collard M.D.   On: 04/05/2023 20:27    Anti-infectives: Anti-infectives (From admission, onward)    Start     Dose/Rate Route Frequency Ordered Stop   04/07/23 1900  vancomycin (VANCOREADY) IVPB 750 mg/150 mL       Placed in "Followed by" Linked Group   750 mg 150 mL/hr over 60 Minutes Intravenous Every 48 hours 04/05/23 1852     04/06/23 1600  ceFEPIme (MAXIPIME) 2 g in sodium chloride 0.9 % 100 mL IVPB        2 g 200 mL/hr over 30 Minutes Intravenous Every 12 hours 04/06/23 1215     04/05/23 1900  ceFEPIme (MAXIPIME) 2 g in sodium chloride 0.9 % 100 mL IVPB  Status:  Discontinued        2 g 200 mL/hr over 30 Minutes Intravenous Every 24 hours 04/05/23 1852 04/06/23 1215   04/05/23 1845  vancomycin (VANCOCIN) IVPB 1000 mg/200 mL premix       Placed in "Followed by" Linked Group   1,000 mg 200 mL/hr over 60 Minutes Intravenous  Once 04/05/23 1852 04/05/23 2056       Assessment/Plan: Foreign body of R great toe Pt with CT scan showing small piece of metal in R great toe. No signs of infection on exam. After discussing with podiatry and radiology colleagues, no indication for surgical intervention at this time. Foreign body should not preclude future MRIs of the feet, and podiatry stated having such a small foreign body is not uncommon in diabetic patients with neuropathy and does not usually warrant intervention. Pt aware of this, and plans to follow with podiatrist outpatient for further foot care. She will continue receiving wound care for ulcers/blisters. - No indication for surgical intervention at this time.   LOS: 1 day    Governor Rooks 04/07/2023

## 2023-04-07 NOTE — Discharge Summary (Addendum)
Physician Discharge Summary   Patient: [name] MRN: [id number] DOB: 1970-12-03  Admit date:     04/05/2023  Discharge date: 04/07/23  Discharge Physician: Kendell Bane   PCP: Elfredia Nevins, MD   Recommendations at discharge:   Follow with a primary care in 1-2 weeks -Follow-up with podiatrist in 1 week  Discharge Diagnoses: Principal Problem:   Diabetic foot (HCC) Active Problems:   Essential hypertension   Tobacco abuse   Hyperlipidemia   COPD exacerbation (HCC)   DM type 2 (diabetes mellitus, type 2) (HCC)   Hypothyroid   Lactic acidosis   AKI (acute kidney injury) (HCC)   Foreign body of skin of toe of right foot   Cellulitis of left leg  Resolved Problems:   * No resolved hospital problems. [facility] Course: [name] is a 70 with history of anxiety, COPD, tobacco use, depression, DM2, HLD, HTN, hypothyroidism presents to the ED with blisters on her feet. She was discharged on September 3 after being treated for septic shock from infected left heel wound and cellulitis. She was given midodrine at the time of discharge and completed antibiotic course. In the ER she has been started on broad-spectrum antibiotics due to concerns of diabetic foot ulcer.      * Diabetic foot (HCC) - With cellulitis and fluid-filled vesicles on right foot and ulcer on heel of left foot - ABIs - Vancomycin cefepime started in the ED - Continue vancomycin and cefepime >>> continue switch to p.o. doxycycline - Patient recently finished Keflex today from previous admission  - Consulted general surgery    Pt with CT scan showing small piece of metal in R great toe. No signs of infection on exam. After discussing with podiatry and radiology colleagues, no indication for surgical intervention at this time. Foreign body should not preclude future MRIs of the feet, and podiatry stated having such a small foreign body is not uncommon in diabetic patients with neuropathy  and does not usually warrant intervention. Pt aware of this, and plans to follow with podiatrist outpatient for further foot care.     AKI (acute kidney injury) (HCC) - Creatinine at baseline 0.96 - Creatinine today 1.36 - ACE and Lasix was discontinued - Reports poor fluid p.o. intake at home - S/P 1 L NS bolus given in ED   Lactic acidosis -Resolved - Lactic acid 2.9 - Likely related to dehydration - No leukocytosis, or hypotension - Slightly tachycardic at arrival to 104 - 1 L bolus given in the ED   Hypothyroid - Continue Synthroid  DM type 2 (diabetes mellitus, type 2) (HCC) -Resume home dose metformin, diabetic diet  COPD exacerbation (HCC) - Continue formulary substitute for Symbicort   Hyperlipidemia - Continue statin  Tobacco abuse - Smokes a pack per day - Counseled on importance of cessation - Nicotine patch ordered  Hypotension (history of HTN) - Continue midodrine - No antihypertensives at this time         Consultants: General Surgery Procedures performed: none   Disposition: Home Diet recommendation:  Discharge Diet Orders (From admission, onward)     Start     Ordered   04/07/23 0000  Diet - low sodium heart healthy        04/07/23 1018           Carb modified diet DISCHARGE MEDICATION: Allergies as of 04/07/2023       Reactions   Penicillins Shortness Of Breath    Tolerated  ceftriaxone 03/27/23   Sulfa Antibiotics Shortness Of Breath   Aspirin Nausea And Vomiting   Enoxaparin Other (See Comments)   Has been known to cause her blood clots in her legs   Prednisone Other (See Comments)   Pancreatitis, but has to take for asthma sometimes   Venofer [iron Sucrose] Nausea Only, Swelling        Medication List     STOP taking these medications    cephALEXin 500 MG capsule Commonly known as: KEFLEX   furosemide 20 MG tablet Commonly known as: LASIX   lisinopril 2.5 MG tablet Commonly known as: ZESTRIL    valACYclovir 500 MG tablet Commonly known as: VALTREX       TAKE these medications    albuterol 108 (90 Base) MCG/ACT inhaler Commonly known as: VENTOLIN HFA Inhale 2 puffs into the lungs daily as needed for wheezing or shortness of breath. What changed: Another medication with the same name was removed. Continue taking this medication, and follow the directions you see here.   amitriptyline 100 MG tablet Commonly known as: ELAVIL Take 100 mg by mouth at bedtime.   ARIPiprazole 10 MG tablet Commonly known as: ABILIFY Take 10 mg by mouth daily.   atorvastatin 20 MG tablet Commonly known as: LIPITOR Take 20 mg by mouth daily.   budesonide-formoterol 80-4.5 MCG/ACT inhaler Commonly known as: Symbicort Inhale 2 puffs into the lungs 2 (two) times daily. What changed: when to take this   busPIRone 15 MG tablet Commonly known as: BUSPAR Take 15 mg by mouth 2 (two) times daily.   doxycycline 50 MG capsule Commonly known as: VIBRAMYCIN Take 1 capsule (50 mg total) by mouth 2 (two) times daily for 10 days.   DULoxetine 60 MG capsule Commonly known as: CYMBALTA Take 60 mg by mouth daily.   gabapentin 300 MG capsule Commonly known as: NEURONTIN Take 300 mg by mouth 3 (three) times daily.   lactobacillus acidophilus & bulgar chewable tablet Chew 1 tablet by mouth 3 (three) times daily with meals for 10 days.   levothyroxine 25 MCG tablet Commonly known as: SYNTHROID Take 25 mcg by mouth daily before breakfast.   metFORMIN 500 MG tablet Commonly known as: GLUCOPHAGE Take 2 tablets (1,000 mg total) by mouth 2 (two) times daily.   methocarbamol 500 MG tablet Commonly known as: ROBAXIN Take 1,000 mg by mouth in the morning and at bedtime.   midodrine 5 MG tablet Commonly known as: PROAMATINE Take 1 tablet (5 mg total) by mouth 3 (three) times daily with meals. What changed: See the new instructions.   morphine 15 MG 12 hr tablet Commonly known as: MS CONTIN Take  15 mg by mouth 2 (two) times daily.   ondansetron 4 MG tablet Commonly known as: ZOFRAN Take 4 mg by mouth every 8 (eight) hours as needed for vomiting or nausea.   oxyCODONE 5 MG immediate release tablet Commonly known as: Oxy IR/ROXICODONE Take 1 tablet (5 mg total) by mouth every 4 (four) hours as needed for moderate pain. What changed:  medication strength how much to take when to take this reasons to take this   pantoprazole 40 MG tablet Commonly known as: PROTONIX Take 40 mg by mouth 2 (two) times daily.   polyethylene glycol powder 17 GM/SCOOP powder Commonly known as: GLYCOLAX/MIRALAX Take 17 g by mouth 2 (two) times daily. Until daily soft stools  OTC What changed:  when to take this reasons to take this   Zenpep 20000-63000 units  Cpep Generic drug: Pancrelipase (Lip-Prot-Amyl) Take 2 capsules by mouth in the morning and at bedtime.               Discharge Care Instructions  (From admission, onward)           Start     Ordered   04/07/23 0000  Discharge wound care:       Comments: Continue wound care per instructions -Clean right lateral heel and right fifth toe lateral aspect normal saline, apply Xeroform gauze, cut to fit blister daily.  Cover with gauze and wrap with Kerlix roll gauze.  May secure the Ace wrap if desired.   04/07/23 1018            Discharge Exam: Filed Weights   04/05/23 1506 04/06/23 1300  Weight: 36.2 kg 50.4 kg        General:  AAO x 3,  cooperative, no distress;   HEENT:  Normocephalic, PERRL, otherwise with in Normal limits   Neuro:  CNII-XII intact. , normal motor and sensation, reflexes intact   Lungs:   Clear to auscultation BL, Respirations unlabored,  No wheezes / crackles  Cardio:    S1/S2, RRR, No murmure, No Rubs or Gallops   Abdomen:  Soft, non-tender, bowel sounds active all four quadrants, no guarding or peritoneal signs.  Muscular  skeletal:  Limited exam -global generalized weaknesses - in  bed, able to move all 4 extremities,   2+ pulses,  symmetric,   Skin:  Dry, warm to touch, negative for any Rashes, bilateral foot superficial wounds  Wounds: Please see nursing documentation  Pressure Injury 04/05/23 Heel Right  closed fluid filled blister on right lateral outer aspect of heel of right foot (Active)  04/05/23 2300  Location: Heel  Location Orientation: Right  Staging: -- (closed fluid filled blister)  Wound Description (Comments): closed fluid filled blister on right lateral outer aspect of heel of right foot  Present on Admission: Yes  Dressing Type Gauze (Comment) (kerlix, xeroform) 04/07/23 0646          Condition at discharge: good  The results of significant diagnostics from this hospitalization (including imaging, microbiology, ancillary and laboratory) are listed below for reference.   Imaging Studies: US ARTERIAL ABI (SCREENING LOWER EXTREMITY)  Result Date: 04/06/2023 CLINICAL DATA:  Diabetic foot wounds EXAM: NONINVASIVE PHYSIOLOGIC VASCULAR STUDY OF BILATERAL LOWER EXTREMITIES TECHNIQUE: Evaluation of both lower extremities were performed at rest, including calculation of ankle-brachial indices with single level Doppler, pressure and pulse volume recording. COMPARISON:  None Available. FINDINGS: Right ABI:  1.1 Left ABI:  1.2 Right Lower Extremity:  Abnormal monophasic arterial waveforms. Left Lower Extremity:  Abnormal monophasic arterial waveforms. 1.0-1.4 Normal IMPRESSION: 1. Presenting ankle-brachial indices are normal bilaterally. However, the arterial waveforms are abnormal and monophasic. The ABIs may be falsely elevated due to poor compressibility of the arteries at the ankle in the setting of medial arteriosclerosis. If further evaluation is clinically warranted, CT arteriogram with bilateral lower extremity runoff could further evaluate. Electronically Signed   By: Malachy Moan M.D.   On: 04/06/2023 11:34   CT FOOT RIGHT WO CONTRAST  Result  Date: 04/06/2023 CLINICAL DATA:  Right foot wound, right foot pain EXAM: CT OF THE RIGHT FOOT WITHOUT CONTRAST TECHNIQUE: Multidetector CT imaging of the right foot was performed according to the standard protocol. Multiplanar CT image reconstructions were also generated. RADIATION DOSE REDUCTION: This exam was performed according to the departmental dose-optimization program which includes automated exposure  control, adjustment of the mA and/or kV according to patient size and/or use of iterative reconstruction technique. COMPARISON:  Plain radiographs obtained 04/05/2023 FINDINGS: Bones/Joint/Cartilage Clawtoe deformities of the second through fifth digits. No acute fracture or dislocation. Joint spaces are preserved. No osseous erosions or abnormal periosteal reaction. Ligaments Suboptimally assessed by CT. Muscles and Tendons There is marked fatty atrophy of the intrinsic musculature of the right foot. Visualized tendinous structures are unremarkable. Soft tissues A 7 mm wire like metallic foreign body is seen within the medial soft tissues of the right great toe adjacent to the distal tuft of the distal phalanx (image # 18/7). Mild diffuse subcutaneous edema of the right foot. A loculated intradermal fluid collection is seen within the lateral hindfoot measuring 1.6 x 4.7 x 4.5 cm. IMPRESSION: 1. No acute fracture or dislocation. No CT evidence of osteomyelitis. 2. 7 mm wire like metallic foreign body within the medial soft tissues of the right great toe adjacent to the distal tuft of the distal phalanx. 3. Mild diffuse subcutaneous edema of the right foot. 4. 1.6 x 4.7 x 4.5 cm loculated intradermal fluid collection within the lateral hindfoot. This may represent a cyst or seroma. 5. Marked fatty atrophy of the intrinsic musculature of the right foot. Electronically Signed   By: Helyn Numbers M.D.   On: 04/06/2023 00:51   DG Chest Portable 1 View  Result Date: 04/05/2023 CLINICAL DATA:  Cough EXAM:  PORTABLE CHEST 1 VIEW COMPARISON:  03/27/2023 FINDINGS: Lungs are clear. No pneumothorax or pleural effusion. Moderate hiatal hernia. Cardiac size within normal limits. Pulmonary vascularity is normal. No acute bone abnormality. IMPRESSION: 1. No active disease. 2. Moderate hiatal hernia. Electronically Signed   By: Helyn Numbers M.D.   On: 04/05/2023 20:29   DG Foot Complete Right  Result Date: 04/05/2023 CLINICAL DATA:  Right foot wounds EXAM: RIGHT FOOT COMPLETE - 3+ VIEW COMPARISON:  None Available. FINDINGS: Clawtoe deformities of the second through fifth digits. No acute fracture or dislocation. Joint spaces are preserved. 7 mm wire like foreign body is seen within the soft tissues medial and plantar to the distal tuft of the great toe. There is moderate soft tissue swelling of the right forefoot. No osseous erosions or abnormal periosteal reaction. IMPRESSION: 1. 7 mm wire like foreign body within the soft tissues medial and plantar to the distal tuft of the great toe. 2. Moderate soft tissue swelling of the right forefoot. No radiographic evidence of osteomyelitis. Electronically Signed   By: Helyn Numbers M.D.   On: 04/05/2023 20:28   DG Foot Complete Left  Result Date: 04/05/2023 CLINICAL DATA:  Wound over the heel and dorsal of the foot as well as anterior shin infection. Recent discharge for sepsis. EXAM: LEFT FOOT - COMPLETE 3+ VIEW; LEFT TIBIA AND FIBULA - 2 VIEW COMPARISON:  None Available. FINDINGS: No acute fracture or dislocation. Subacute or chronic fracture of the second metatarsal with some callus formation but nonunion. The bones are osteopenic. There is diffuse subcutaneous edema. Ulceration of the skin over the plantar heel with overlying Band-Aid. No radiopaque foreign object or soft tissue gas. IMPRESSION: 1. No acute fracture or dislocation. 2. Subacute or chronic fracture of the second metatarsal with nonunion. 3. Diffuse subcutaneous edema and ulceration of the skin of the  plantar heel. Electronically Signed   By: Elgie Collard M.D.   On: 04/05/2023 20:27   DG Tibia/Fibula Left  Result Date: 04/05/2023 CLINICAL DATA:  Wound over the heel and  dorsal of the foot as well as anterior shin infection. Recent discharge for sepsis. EXAM: LEFT FOOT - COMPLETE 3+ VIEW; LEFT TIBIA AND FIBULA - 2 VIEW COMPARISON:  None Available. FINDINGS: No acute fracture or dislocation. Subacute or chronic fracture of the second metatarsal with some callus formation but nonunion. The bones are osteopenic. There is diffuse subcutaneous edema. Ulceration of the skin over the plantar heel with overlying Band-Aid. No radiopaque foreign object or soft tissue gas. IMPRESSION: 1. No acute fracture or dislocation. 2. Subacute or chronic fracture of the second metatarsal with nonunion. 3. Diffuse subcutaneous edema and ulceration of the skin of the plantar heel. Electronically Signed   By: Elgie Collard M.D.   On: 04/05/2023 20:27   CT CHEST ABDOMEN PELVIS WO CONTRAST  Result Date: 03/27/2023 CLINICAL DATA:  Sepsis, chest pain, the pain radiates to the back. Shortness of breath on exertion. Cough for 2 days. Low blood pressure. EXAM: CT CHEST, ABDOMEN AND PELVIS WITHOUT CONTRAST TECHNIQUE: Multidetector CT imaging of the chest, abdomen and pelvis was performed following the standard protocol without IV contrast. RADIATION DOSE REDUCTION: This exam was performed according to the departmental dose-optimization program which includes automated exposure control, adjustment of the mA and/or kV according to patient size and/or use of iterative reconstruction technique. COMPARISON:  Chest radiograph 03/27/2023; CT abdomen and pelvis 11/26/2022 and CT chest 05/11/2022 FINDINGS: CT CHEST FINDINGS Cardiovascular: Normal heart size. No pericardial effusion. Aortic atherosclerotic calcification. Mediastinum/Nodes: Large hiatal hernia containing a portion of the stomach within the chest. Small amount of adherent debris  within the upper trachea. No thoracic adenopathy noting limitations of noncontrast exam. Lungs/Pleura: Mosaic attenuation of the lungs compatible with air trapping. Mild diffuse bronchial wall thickening. Layering debris in the left mainstem bronchus. No focal consolidation, pleural effusion, or pneumothorax. Musculoskeletal: Unchanged superior endplate compression fracture of T12. No acute fracture. CT ABDOMEN PELVIS FINDINGS Hepatobiliary: Cholecystectomy. Unremarkable noncontrast appearance of the liver and biliary tree. Pancreas: No acute abnormality. Spleen: Unremarkable. Adrenals/Urinary Tract: Stable adrenal glands no urinary calculi or hydronephrosis. Unremarkable bladder. Stomach/Bowel: Diffuse dilation of the small bowel upstream from an area of fecalized ileum in the right lower quadrant. There is smooth tapering to the area of fecalized ileum (circa series 6/image 66-86). The fecalized ileum tapers the decompressed terminal ileum (circa series 6/image 68-90) with possible transition point and series 6/image 90 in the right lower quadrant. Large colonic stool load in the normal caliber colon. Redundant sigmoid colon. Normal appendix. Large hiatal hernia containing a portion of the stomach within the chest. Vascular/Lymphatic: Aortic atherosclerosis. No enlarged abdominal or pelvic lymph nodes. Reproductive: Hysterectomy. Other: No free intraperitoneal fluid or air. Musculoskeletal: Unchanged superior endplate compression fracture of L1. No acute fracture. IMPRESSION: 1. Diffuse dilation of the small bowel upstream from an area of fecalized ileum in the right lower quadrant. There is smooth tapering to the area of fecalized ileum. The fecalized ileum tapers into decompressed terminal ileum with possible transition point in the right lower quadrant. Findings are concerning for partial small-bowel obstruction. 2. Large colonic stool burden compatible with constipation. 3. Large hiatal hernia containing a  portion of the stomach within the chest. 4. Pulmonary findings compatible with airway inflammation/infection. Query chronic aspiration given large hiatal hernia containing a portion of the stomach. Aortic Atherosclerosis (ICD10-I70.0). Electronically Signed   By: Minerva Fester M.D.   On: 03/27/2023 18:22   DG Foot Complete Left  Result Date: 03/27/2023 CLINICAL DATA:  Oozing from foot.  Sepsis. EXAM:  LEFT FOOT - COMPLETE 3+ VIEW COMPARISON:  01/26/2020, without report FINDINGS: Forefoot soft tissue swelling dorsally. No soft tissue gas or radiopaque foreign object. No osseous destruction or soft tissue ulcer identified. Nonacute fractures of the second through fourth metatarsal shafts. The second fracture is incompletely healed with minimal comminution. IMPRESSION: Soft tissue swelling without other explanation for "oozing". Nonacute second through fourth metatarsal fractures as detailed above. Electronically Signed   By: Jeronimo Greaves M.D.   On: 03/27/2023 17:45   DG Chest Port 1 View  Result Date: 03/27/2023 CLINICAL DATA:  Chest pain.  Shortness of breath. EXAM: PORTABLE CHEST 1 VIEW COMPARISON:  03/11/2022 FINDINGS: The lungs are clear without focal pneumonia, edema, pneumothorax or pleural effusion. Interstitial markings are diffusely coarsened with chronic features. Streaky opacity at the bases, left greater than right is compatible with atelectasis or scarring. The cardiopericardial silhouette is within normal limits for size. Large hiatal hernia. IMPRESSION: 1. Chronic interstitial coarsening with bibasilar atelectasis or scarring. 2. Large hiatal hernia. Electronically Signed   By: Kennith Center M.D.   On: 03/27/2023 17:45   US Venous Img Lower Unilateral Left (DVT)  Result Date: 03/22/2023 CLINICAL DATA:  Left lower extremity pain for the past week EXAM: LEFT LOWER EXTREMITY VENOUS DOPPLER ULTRASOUND TECHNIQUE: Gray-scale sonography with compression, as well as color and duplex ultrasound,  were performed to evaluate the deep venous system(s) from the level of the common femoral vein through the popliteal and proximal calf veins. COMPARISON:  None Available. FINDINGS: VENOUS Normal compressibility of the common femoral, superficial femoral, and popliteal veins, as well as the visualized calf veins. Visualized portions of profunda femoral vein and great saphenous vein unremarkable. No filling defects to suggest DVT on grayscale or color Doppler imaging. Doppler waveforms show normal direction of venous flow, normal respiratory plasticity and response to augmentation. Limited views of the contralateral common femoral vein are unremarkable. OTHER Mildly complex fluid collection in the popliteal fossa measures 4.6 x 1.8 x 1.8 cm. The margins are somewhat irregular. No evidence of color flow on color Doppler imaging. Fluid extends into the upper calf. Limitations: none IMPRESSION: 1. No evidence of deep venous thrombosis. 2. Irregular and mildly complex Baker's cyst with extension of fluid into the upper calf and subcutaneous soft tissues. Findings suggest rupture of a Baker's cyst. Electronically Signed   By: Malachy Moan M.D.   On: 03/22/2023 13:09    Microbiology: Results for orders placed or performed during the hospital encounter of 04/05/23  Blood culture (routine x 2)     Status: None (Preliminary result)   Collection Time: 04/05/23  5:20 PM   Specimen: Right Antecubital; Blood  Result Value Ref Range Status   Specimen Description RIGHT ANTECUBITAL  Final   Special Requests   Final    BOTTLES DRAWN AEROBIC ONLY Blood Culture adequate volume   Culture   Final    NO GROWTH 2 DAYS Performed at [facility], [address] [place], [place] [address]    Report Status PENDING  Incomplete  Blood culture (routine x 2)     Status: None (Preliminary result)   Collection Time: 04/05/23  5:44 PM   Specimen: BLOOD  Result Value Ref Range Status   Specimen Description BLOOD BLOOD LEFT ARM   Final   Special Requests   Final    BOTTLES DRAWN AEROBIC AND ANAEROBIC Blood Culture results may not be optimal due to an excessive volume of blood received in culture bottles   Culture  Final    NO GROWTH 2 DAYS Performed at [facility], [address] [place], [place] [address]    Report Status PENDING  Incomplete    Labs: CBC: Recent Labs  Lab 04/05/23 1705 04/06/23 0350 04/07/23 0406  WBC 10.6* 6.6 5.8  NEUTROABS 7.0 3.6  --   HGB 12.3 9.4* 10.0*  HCT 38.6 28.5* 30.7*  MCV 101.0* 100.7* 100.3*  PLT 327 248 265   Basic Metabolic Panel: Recent Labs  Lab 04/05/23 1705 04/06/23 0350 04/07/23 0406  NA 134* 139 138  K 3.7 3.4* 4.3  CL 97* 105 103  CO2 27 23 28   GLUCOSE 152* 79 121*  BUN 27* 23* 13  CREATININE 1.36* 1.10* 0.79  CALCIUM 7.9* 7.2* 8.1*  MG  --  1.1* 1.7  PHOS  --   --  3.1   Liver Function Tests: Recent Labs  Lab 04/05/23 1705 04/06/23 0350  AST 20 16  ALT 22 14  ALKPHOS 114 74  BILITOT 0.5 0.3  PROT 6.9 4.8*  ALBUMIN 3.1* 2.1*   CBG: Recent Labs  Lab 04/06/23 1120 04/06/23 1609 04/06/23 2035 04/07/23 0714 04/07/23 1111  GLUCAP 198* 154* 139* 96 175*    Discharge time spent: greater than 30 minutes.  Signed: Kendell Bane, MD [facility] 04/07/2023

## 2023-04-08 ENCOUNTER — Encounter (HOSPITAL_COMMUNITY): Payer: Self-pay | Admitting: Hematology

## 2023-04-08 NOTE — TOC Progression Note (Signed)
Transition of Care [id number]) - Progression Note    Patient Details  Name: [name] MRN: [id number] Date of Birth: [birth year]  Transition of Care [facility]) CM/SW Contact  Leitha Bleak, RN Phone Number: 04/08/2023, 12:47 PM  Clinical Narrative:   TOC continuing to fax out referrals after discharge to get [facility].  No agency in [place] is accepting [name]. CM call patient. She will follow up with her PCP on Monday and discuss.    Barriers to Discharge: No Home Care Agency will accept this patient  Expected Discharge Plan and Services         Expected Discharge Date: 04/07/23                         [facility] Arranged: RN           Social Determinants of Health (SDOH) Interventions SDOH Screenings   Food Insecurity: No Food Insecurity (04/05/2023)  Housing: Low Risk  (04/05/2023)  Transportation Needs: No Transportation Needs (04/05/2023)  Utilities: Not At Risk (04/05/2023)  Depression (PHQ2-9): Medium Risk (10/16/2019)  Financial Resource Strain: Low Risk  (02/16/2019)  Tobacco Use: High Risk (04/05/2023)    Readmission Risk Interventions    04/07/2023    1:21 PM 03/09/2022    2:36 PM  Readmission Risk Prevention Plan  Transportation Screening Complete Complete  PCP or Specialist Appt within 3-5 Days Not Complete   HRI or Home Care Consult Complete Complete  Social Work Consult for Recovery Care Planning/Counseling Complete Complete  Palliative Care Screening Not Applicable Not Applicable  Medication Review Oceanographer) Complete Complete

## 2023-04-10 LAB — CULTURE, BLOOD (ROUTINE X 2)
Culture: NO GROWTH
Culture: NO GROWTH
Special Requests: ADEQUATE

## 2023-05-07 ENCOUNTER — Inpatient Hospital Stay (HOSPITAL_COMMUNITY)
Admission: EM | Admit: 2023-05-07 | Discharge: 2023-05-09 | DRG: 392 | Disposition: A | Discharge status: Home / Self Care | Payer: Medicare Other | Attending: Internal Medicine | Admitting: Internal Medicine

## 2023-05-07 ENCOUNTER — Emergency Department (HOSPITAL_COMMUNITY): Payer: Medicare Other

## 2023-05-07 ENCOUNTER — Other Ambulatory Visit: Payer: Self-pay

## 2023-05-07 ENCOUNTER — Encounter (HOSPITAL_COMMUNITY): Payer: Self-pay

## 2023-05-07 DIAGNOSIS — Z809 Family history of malignant neoplasm, unspecified: Secondary | ICD-10-CM

## 2023-05-07 DIAGNOSIS — E872 Acidosis, unspecified: Secondary | ICD-10-CM | POA: Diagnosis present

## 2023-05-07 DIAGNOSIS — M50222 Other cervical disc displacement at C5-C6 level: Secondary | ICD-10-CM | POA: Diagnosis present

## 2023-05-07 DIAGNOSIS — Z7951 Long term (current) use of inhaled steroids: Secondary | ICD-10-CM

## 2023-05-07 DIAGNOSIS — S0990XA Unspecified injury of head, initial encounter: Secondary | ICD-10-CM

## 2023-05-07 DIAGNOSIS — R6510 Systemic inflammatory response syndrome (SIRS) of non-infectious origin without acute organ dysfunction: Secondary | ICD-10-CM | POA: Diagnosis present

## 2023-05-07 DIAGNOSIS — D72829 Elevated white blood cell count, unspecified: Secondary | ICD-10-CM | POA: Diagnosis present

## 2023-05-07 DIAGNOSIS — L89611 Pressure ulcer of right heel, stage 1: Secondary | ICD-10-CM | POA: Diagnosis present

## 2023-05-07 DIAGNOSIS — Z888 Allergy status to other drugs, medicaments and biological substances status: Secondary | ICD-10-CM

## 2023-05-07 DIAGNOSIS — E871 Hypo-osmolality and hyponatremia: Secondary | ICD-10-CM | POA: Diagnosis present

## 2023-05-07 DIAGNOSIS — S161XXA Strain of muscle, fascia and tendon at neck level, initial encounter: Secondary | ICD-10-CM

## 2023-05-07 DIAGNOSIS — Z886 Allergy status to analgesic agent status: Secondary | ICD-10-CM

## 2023-05-07 DIAGNOSIS — I10 Essential (primary) hypertension: Secondary | ICD-10-CM | POA: Diagnosis present

## 2023-05-07 DIAGNOSIS — F17210 Nicotine dependence, cigarettes, uncomplicated: Secondary | ICD-10-CM | POA: Diagnosis present

## 2023-05-07 DIAGNOSIS — E876 Hypokalemia: Secondary | ICD-10-CM | POA: Diagnosis present

## 2023-05-07 DIAGNOSIS — Z8249 Family history of ischemic heart disease and other diseases of the circulatory system: Secondary | ICD-10-CM

## 2023-05-07 DIAGNOSIS — K449 Diaphragmatic hernia without obstruction or gangrene: Secondary | ICD-10-CM | POA: Diagnosis present

## 2023-05-07 DIAGNOSIS — E860 Dehydration: Secondary | ICD-10-CM | POA: Diagnosis present

## 2023-05-07 DIAGNOSIS — A419 Sepsis, unspecified organism: Secondary | ICD-10-CM

## 2023-05-07 DIAGNOSIS — F319 Bipolar disorder, unspecified: Secondary | ICD-10-CM | POA: Diagnosis present

## 2023-05-07 DIAGNOSIS — Z880 Allergy status to penicillin: Secondary | ICD-10-CM

## 2023-05-07 DIAGNOSIS — Z7984 Long term (current) use of oral hypoglycemic drugs: Secondary | ICD-10-CM

## 2023-05-07 DIAGNOSIS — Z882 Allergy status to sulfonamides status: Secondary | ICD-10-CM

## 2023-05-07 DIAGNOSIS — K5909 Other constipation: Secondary | ICD-10-CM | POA: Diagnosis not present

## 2023-05-07 DIAGNOSIS — E785 Hyperlipidemia, unspecified: Secondary | ICD-10-CM | POA: Diagnosis present

## 2023-05-07 DIAGNOSIS — Z833 Family history of diabetes mellitus: Secondary | ICD-10-CM

## 2023-05-07 DIAGNOSIS — Y92009 Unspecified place in unspecified non-institutional (private) residence as the place of occurrence of the external cause: Secondary | ICD-10-CM

## 2023-05-07 DIAGNOSIS — E039 Hypothyroidism, unspecified: Secondary | ICD-10-CM | POA: Diagnosis present

## 2023-05-07 DIAGNOSIS — W06XXXA Fall from bed, initial encounter: Secondary | ICD-10-CM | POA: Diagnosis present

## 2023-05-07 DIAGNOSIS — Z79890 Hormone replacement therapy (postmenopausal): Secondary | ICD-10-CM

## 2023-05-07 DIAGNOSIS — J449 Chronic obstructive pulmonary disease, unspecified: Secondary | ICD-10-CM | POA: Diagnosis present

## 2023-05-07 DIAGNOSIS — W19XXXA Unspecified fall, initial encounter: Secondary | ICD-10-CM

## 2023-05-07 DIAGNOSIS — Z79899 Other long term (current) drug therapy: Secondary | ICD-10-CM

## 2023-05-07 DIAGNOSIS — Z79891 Long term (current) use of opiate analgesic: Secondary | ICD-10-CM

## 2023-05-07 DIAGNOSIS — E1140 Type 2 diabetes mellitus with diabetic neuropathy, unspecified: Secondary | ICD-10-CM | POA: Diagnosis present

## 2023-05-07 DIAGNOSIS — J4489 Other specified chronic obstructive pulmonary disease: Secondary | ICD-10-CM | POA: Diagnosis present

## 2023-05-07 DIAGNOSIS — F17200 Nicotine dependence, unspecified, uncomplicated: Secondary | ICD-10-CM | POA: Diagnosis present

## 2023-05-07 DIAGNOSIS — G8929 Other chronic pain: Secondary | ICD-10-CM | POA: Diagnosis present

## 2023-05-07 DIAGNOSIS — J9811 Atelectasis: Secondary | ICD-10-CM | POA: Diagnosis present

## 2023-05-07 DIAGNOSIS — N179 Acute kidney failure, unspecified: Secondary | ICD-10-CM | POA: Diagnosis present

## 2023-05-07 DIAGNOSIS — K5903 Drug induced constipation: Secondary | ICD-10-CM

## 2023-05-07 DIAGNOSIS — R109 Unspecified abdominal pain: Principal | ICD-10-CM

## 2023-05-07 DIAGNOSIS — F419 Anxiety disorder, unspecified: Secondary | ICD-10-CM | POA: Diagnosis present

## 2023-05-07 DIAGNOSIS — I959 Hypotension, unspecified: Secondary | ICD-10-CM | POA: Diagnosis present

## 2023-05-07 DIAGNOSIS — Z825 Family history of asthma and other chronic lower respiratory diseases: Secondary | ICD-10-CM

## 2023-05-07 LAB — COMPREHENSIVE METABOLIC PANEL
ALT: 38 U/L (ref 0–44)
AST: 57 U/L — ABNORMAL HIGH (ref 15–41)
Albumin: 2.6 g/dL — ABNORMAL LOW (ref 3.5–5.0)
Alkaline Phosphatase: 75 U/L (ref 38–126)
Anion gap: 16 — ABNORMAL HIGH (ref 5–15)
BUN: 71 mg/dL — ABNORMAL HIGH (ref 6–20)
CO2: 23 mmol/L (ref 22–32)
Calcium: 7.9 mg/dL — ABNORMAL LOW (ref 8.9–10.3)
Chloride: 94 mmol/L — ABNORMAL LOW (ref 98–111)
Creatinine, Ser: 2.97 mg/dL — ABNORMAL HIGH (ref 0.44–1.00)
GFR, Estimated: 18 mL/min — ABNORMAL LOW (ref >60)
Glucose, Bld: 143 mg/dL — ABNORMAL HIGH (ref 70–99)
Potassium: 4.2 mmol/L (ref 3.5–5.1)
Sodium: 133 mmol/L — ABNORMAL LOW (ref 135–145)
Total Bilirubin: 0.7 mg/dL (ref 0.3–1.2)
Total Protein: 5.7 g/dL — ABNORMAL LOW (ref 6.5–8.1)

## 2023-05-07 LAB — URINALYSIS, ROUTINE W REFLEX MICROSCOPIC
Bilirubin Urine: NEGATIVE
Glucose, UA: NEGATIVE mg/dL
Hgb urine dipstick: NEGATIVE
Ketones, ur: NEGATIVE mg/dL
Leukocytes,Ua: NEGATIVE
Nitrite: NEGATIVE
Protein, ur: NEGATIVE mg/dL
Specific Gravity, Urine: 1.015 (ref 1.005–1.030)
pH: 5 (ref 5.0–8.0)

## 2023-05-07 LAB — LIPASE, BLOOD: Lipase: 23 U/L (ref 11–51)

## 2023-05-07 LAB — CBC WITH DIFFERENTIAL/PLATELET
Abs Immature Granulocytes: 0.11 10*3/uL — ABNORMAL HIGH (ref 0.00–0.07)
Basophils Absolute: 0 10*3/uL (ref 0.0–0.1)
Basophils Relative: 0 %
Eosinophils Absolute: 0 10*3/uL (ref 0.0–0.5)
Eosinophils Relative: 0 %
HCT: 35.6 % — ABNORMAL LOW (ref 36.0–46.0)
Hemoglobin: 11.1 g/dL — ABNORMAL LOW (ref 12.0–15.0)
Immature Granulocytes: 1 %
Lymphocytes Relative: 9 %
Lymphs Abs: 1.8 10*3/uL (ref 0.7–4.0)
MCH: 32.3 pg (ref 26.0–34.0)
MCHC: 31.2 g/dL (ref 30.0–36.0)
MCV: 103.5 fL — ABNORMAL HIGH (ref 80.0–100.0)
Monocytes Absolute: 1.5 10*3/uL — ABNORMAL HIGH (ref 0.1–1.0)
Monocytes Relative: 8 %
Neutro Abs: 16.7 10*3/uL — ABNORMAL HIGH (ref 1.7–7.7)
Neutrophils Relative %: 82 %
Platelets: 280 10*3/uL (ref 150–400)
RBC: 3.44 MIL/uL — ABNORMAL LOW (ref 3.87–5.11)
RDW: 14.3 % (ref 11.5–15.5)
WBC: 20.2 10*3/uL — ABNORMAL HIGH (ref 4.0–10.5)
nRBC: 0 % (ref 0.0–0.2)

## 2023-05-07 LAB — LACTIC ACID, PLASMA
Lactic Acid, Venous: 2.2 mmol/L (ref 0.5–1.9)
Lactic Acid, Venous: 1.5 mmol/L (ref 0.5–1.9)

## 2023-05-07 LAB — CBG MONITORING, ED: Glucose-Capillary: 140 mg/dL — ABNORMAL HIGH (ref 70–99)

## 2023-05-07 MED ORDER — LACTATED RINGERS IV SOLN: INTRAVENOUS | Status: DC

## 2023-05-07 MED ORDER — SODIUM CHLORIDE 0.9 % IV SOLN: 2.0000 g | INTRAVENOUS | Status: DC

## 2023-05-07 MED ORDER — LACTATED RINGERS IV BOLUS (SEPSIS)
500.0000 mL | Freq: Once | INTRAVENOUS | Status: AC
  Administered 2023-05-07: 500 mL via INTRAVENOUS

## 2023-05-07 MED ORDER — SODIUM CHLORIDE 0.9 % IV SOLN
2.0000 g | Freq: Once | INTRAVENOUS | Status: AC
  Administered 2023-05-07: 2 g via INTRAVENOUS
  Filled 2023-05-07: qty 12.5

## 2023-05-07 MED ORDER — SODIUM CHLORIDE 0.9 % IV SOLN: INTRAVENOUS | Status: DC

## 2023-05-07 MED ORDER — LACTATED RINGERS IV BOLUS (SEPSIS)
250.0000 mL | Freq: Once | INTRAVENOUS | Status: AC
  Administered 2023-05-07: 250 mL via INTRAVENOUS

## 2023-05-07 MED ORDER — SODIUM CHLORIDE 0.9 % IV BOLUS
1000.0000 mL | Freq: Once | INTRAVENOUS | Status: AC
  Administered 2023-05-07: 1000 mL via INTRAVENOUS

## 2023-05-07 MED ORDER — METRONIDAZOLE 500 MG/100ML IV SOLN
500.0000 mg | Freq: Once | INTRAVENOUS | Status: AC
  Administered 2023-05-07: 500 mg via INTRAVENOUS
  Filled 2023-05-07: qty 100

## 2023-05-07 MED ORDER — VANCOMYCIN HCL IN DEXTROSE 1-5 GM/200ML-% IV SOLN
1000.0000 mg | Freq: Once | INTRAVENOUS | Status: AC
  Administered 2023-05-07: 1000 mg via INTRAVENOUS
  Filled 2023-05-07: qty 200

## 2023-05-07 MED ORDER — VANCOMYCIN HCL 750 MG/150ML IV SOLN: 750.0000 mg | INTRAVENOUS | Status: DC

## 2023-05-07 MED ORDER — LACTATED RINGERS IV BOLUS (SEPSIS)
1000.0000 mL | Freq: Once | INTRAVENOUS | Status: AC
  Administered 2023-05-07: 1000 mL via INTRAVENOUS

## 2023-05-07 NOTE — Progress Notes (Signed)
Pt being followed by [facility] for Sepsis protocol. 

## 2023-05-07 NOTE — ED Notes (Signed)
BGL in triage 141 12 lead completed

## 2023-05-07 NOTE — ED Notes (Signed)
ED Provider at bedside. 

## 2023-05-07 NOTE — ED Triage Notes (Signed)
Constipation x2 weeks Has been taking Miralax at home, no help. Complains of nausea and vomiting Has not been able to pass gas.   Left sided neck and shoulder pain x2-3days. Decreased ROM with neck Denies fevers or injuries.

## 2023-05-07 NOTE — ED Provider Notes (Signed)
[place] EMERGENCY DEPARTMENT AT [facility] Provider Note  CSN: [id number] Arrival date & time: 05/07/23 1144  Chief Complaint(s) Constipation  HPI Lakelyn P Dang is a 52 y.o. female with past medical history as below, significant for bipolar 1 disorder, COPD, chronic abdominal pain, chronic constipation, HLD, IDA, chronic pancreatitis who presents to the ED with complaint of abd pain, fall.  Patient reports no bowel movement approximate 3 weeks, nausea and vomiting past 2 3 days.  Worsening left lower quad abdominal pain past 2 to 3 days.  She had a fall 3 days ago tripped over object and hit her neck on the ground.  Pain turning her neck/head to the left.  No LOC no thinners.  Not been eating or drinking much the past few days secondary to worsening abdominal pain, nausea vomiting.  Not passing flatus.  Denies feculent emesis.  Denies prior abdominal surgeries.  Tried enema at home which did not yield relief  Past Medical History Past Medical History:  Diagnosis Date   Anemia    Anxiety    Asthma    Bipolar 1 disorder (HCC)    Chronic abdominal pain    COPD (chronic obstructive pulmonary disease) (HCC)    Depression    Diabetes mellitus    Diabetic neuropathy (HCC) 10/29/2017   Esophagitis    Hiatal hernia    Hyperlipidemia 02/03/2012   Iron deficiency anemia due to chronic blood loss 01/29/2022   Migraine    Neck pain 06/08/2012   Pancreatitis chronic Idiopathic   Treated by Dr. Margaretha Glassing at [facility] in the past with celiac blocks.   Patient Active Problem List   Diagnosis Date Noted   Foreign body of skin of toe of right foot 04/06/2023   Cellulitis of left leg 04/06/2023   Diabetic foot (HCC) 04/05/2023   Cellulitis of left foot 03/30/2023   Hypomagnesemia 03/30/2023   Hypovolemic shock (HCC) 03/27/2023   Malnutrition of moderate degree 11/27/2022   Fall 11/26/2022   Sepsis (HCC) 03/07/2022   Coffee ground emesis    Multifocal pneumonia 03/06/2022    Hyperkalemia 03/06/2022   Partial small bowel obstruction (HCC) 03/06/2022   Lactic acidosis 03/06/2022   AKI (acute kidney injury) (HCC) 03/06/2022   Iron deficiency anemia due to chronic blood loss 01/29/2022   Severe sepsis (HCC) 08/24/2021   Lobar pneumonia (HCC) 08/24/2021   Acute on chronic respiratory failure with hypoxia (HCC) 08/24/2021   Thoracic degenerative disc disease 12/06/2019   Hypothyroid 05/31/2019   Arteriovenous malformation (AVM)    Symptomatic anemia    Vitamin B12 deficiency 05/25/2019   Melena    History of pancreatitis 05/06/2019   Septic shock (HCC) 05/06/2019   Radicular pain 01/17/2019   Diabetic neuropathy (HCC) 10/29/2017   Elevated white blood cell count, unspecified 01/23/2015   Eosinophilia 01/23/2015   Iron deficiency 01/23/2015   Taking multiple medications for chronic disease 01/23/2015   DM type 2 (diabetes mellitus, type 2) (HCC) 03/28/2014   COPD exacerbation (HCC) 03/27/2014   CAP (community acquired pneumonia) 06/29/2013   Acute bronchitis 06/29/2013   Anxiety 02/27/2013   Abdominal cramping 02/27/2013   BRBPR (bright red blood per rectum) 12/02/2012   Constipation 12/02/2012   ABLA (acute blood loss anemia) 12/02/2012   Pancreatitis 11/24/2012   Hyperglycemia, drug-induced 06/08/2012   Arm paresthesia, left 06/07/2012   Neck pain on left side 06/07/2012   Asthma exacerbation 04/05/2012   Acute otitis media 02/04/2012   Hyperlipidemia 02/03/2012   Acute respiratory  failure with hypoxia (HCC) 09/23/2011   Tobacco abuse 09/23/2011   Constipation 08/29/2011   Hypokalemia 07/15/2011   Muscle spasm 04/24/2011   Otitis externa 04/24/2011   Idiopathic acute pancreatitis 04/24/2011   Malodorous urine 10/13/2010   Suprapubic pain 10/13/2010   Anxiety and depression 10/12/2010   Back pain, thoracic 10/12/2010   Persistent posttraumatic perforation of ear drum 10/12/2010   Mycoplasma infection in conditions classified elsewhere and  of unspecified site 02/19/2010   Dysuria 02/16/2010   Asthma, moderate persistent 08/11/2009   Closed fracture of neck of radius 01/08/2009   ANEMIA, IRON DEFICIENCY 01/04/2009   Anxiety state 01/04/2009   Bipolar disorder (HCC) 01/04/2009   Migraine headache 01/04/2009   Essential hypertension 01/04/2009   Asthma 01/04/2009   ESOPHAGEAL STRICTURE 01/04/2009   GERD 01/04/2009   Diaphragmatic hernia 01/04/2009   HEMATOCHEZIA 01/04/2009   Diarrhea 01/04/2009   Generalized abdominal pain 01/04/2009   Hiatal hernia 01/04/2009   Home Medication(s) Prior to Admission medications   Medication Sig Start Date End Date Taking? Authorizing Provider  albuterol (PROVENTIL HFA;VENTOLIN HFA) 108 (90 BASE) MCG/ACT inhaler Inhale 2 puffs into the lungs daily as needed for wheezing or shortness of breath.    [provider]  amitriptyline (ELAVIL) 100 MG tablet Take 100 mg by mouth at bedtime.    [provider]  ARIPiprazole (ABILIFY) 10 MG tablet Take 10 mg by mouth daily. 03/22/23   [provider]  atorvastatin (LIPITOR) 20 MG tablet Take 20 mg by mouth daily.    [provider]  budesonide-formoterol (SYMBICORT) 80-4.5 MCG/ACT inhaler Inhale 2 puffs into the lungs 2 (two) times daily. Patient taking differently: Inhale 2 puffs into the lungs daily. 03/30/14   Standley Brooking, MD  busPIRone (BUSPAR) 15 MG tablet Take 15 mg by mouth 2 (two) times daily. 03/22/23   [provider]  DULoxetine (CYMBALTA) 60 MG capsule Take 60 mg by mouth daily. 06/24/22   [provider]  gabapentin (NEURONTIN) 300 MG capsule Take 300 mg by mouth 3 (three) times daily. 04/11/19   [provider]  levothyroxine (SYNTHROID, LEVOTHROID) 25 MCG tablet Take 25 mcg by mouth daily before breakfast.    [provider]  metFORMIN (GLUCOPHAGE) 500 MG tablet Take 2 tablets (1,000 mg total) by mouth 2 (two) times daily. 04/01/23   Hughie Closs, MD  methocarbamol  (ROBAXIN) 500 MG tablet Take 1,000 mg by mouth in the morning and at bedtime.    [provider]  midodrine (PROAMATINE) 5 MG tablet Take 1 tablet (5 mg total) by mouth 3 (three) times daily with meals. 04/07/23 05/07/23  Shahmehdi, Gemma Payor, MD  morphine (MS CONTIN) 15 MG 12 hr tablet Take 15 mg by mouth 2 (two) times daily. 03/08/23   [provider]  ondansetron (ZOFRAN) 4 MG tablet Take 4 mg by mouth every 8 (eight) hours as needed for vomiting or nausea. 06/02/19   [provider]  oxyCODONE (OXY IR/ROXICODONE) 5 MG immediate release tablet Take 1 tablet (5 mg total) by mouth every 4 (four) hours as needed for moderate pain. 04/07/23   Shahmehdi, Gemma Payor, MD  Pancrelipase, Lip-Prot-Amyl, (ZENPEP) 20000-63000 units CPEP Take 2 capsules by mouth in the morning and at bedtime. 09/11/19   [provider]  pantoprazole (PROTONIX) 40 MG tablet Take 40 mg by mouth 2 (two) times daily.    [provider]  polyethylene glycol powder (GLYCOLAX/MIRALAX) powder Take 17 g by mouth 2 (two) times daily.  Until daily soft stools  OTC Patient taking differently: Take 17 g by mouth as needed for mild constipation. Until daily soft stools  OTC 03/05/15   Barrett Henle, PA-C                                                                                                                                    Past Surgical History Past Surgical History:  Procedure Laterality Date   ABDOMINAL HYSTERECTOMY     attempted colonoscopy  02/2017   Dr. Margaretha Glassing: Poor prep, scope passed to mid transverse colon before colonoscopy aborted.  No significant findings noted to mid transverse colon.  Next colonoscopy in 2 years.   CELIAC PLEXUS BLOCK     CHOLECYSTECTOMY     COLONOSCOPY N/A 12/05/2012   YNW:GNFAOZ rectum, colon and terminal ileum   ESOPHAGOGASTRODUODENOSCOPY  02/20/2008   HYQ:MVHQIONGE distal esophageal mucosa, suspicious for neoplasm/Hiatal hernia otherwise normal     ESOPHAGOGASTRODUODENOSCOPY  04/03/2008   [id number]:[id number] hiatal hernia, otherwise normal/Short, tight, benign-appearing peptic stricture   ESOPHAGOGASTRODUODENOSCOPY  November 16, 2012   Dr. Teena Dunk: hiatal hernia, esophagitis,    ESOPHAGOGASTRODUODENOSCOPY (EGD) WITH PROPOFOL N/A 05/09/2019   Dr. Darrick Penna: Large hiatal hernia with erythema and edema in the pouch, mild gastritis.  No biopsies taken.   ESOPHAGOGASTRODUODENOSCOPY (EGD) WITH PROPOFOL N/A 05/26/2019   Procedure: ESOPHAGOGASTRODUODENOSCOPY (EGD) WITH PROPOFOL;  Surgeon: West Bali, MD;  Location: [facility];  Service: Endoscopy;  Laterality: N/A;   EUS  12/2018   Dr. Margaretha Glassing: LA grade B esophagitis, antritis but negative H. pylori, 1 cm ulcer at the duodenal sweep   GIVENS CAPSULE STUDY N/A 12/05/2012   Few superficial erosions but nothing found to explain iron deficiency anemia.    GIVENS CAPSULE STUDY N/A 05/26/2019   Procedure: GIVENS CAPSULE STUDY;  Surgeon: West Bali, MD;  Location: [facility];  Service: Endoscopy;  Laterality: N/A;   Ileocolonoscopy  06/05/2008     [id number]:[id number] anal canal, otherwise normal rectum, colon   TONSILLECTOMY     Family History Family History  Problem Relation Age of Onset   Asthma Other    Asthma Paternal Grandfather    Heart attack Paternal Grandfather    Hypertension Mother    Cancer Maternal Grandmother    Diabetes Paternal Grandmother    Coronary artery disease Neg Hx    Colon cancer Neg Hx     Social History Social History   Tobacco Use   Smoking status: Every Day    Current packs/day: 1.00    Average packs/day: 1 pack/day for 25.0 years (25.0 ttl pk-yrs)    Types: Cigarettes    Passive exposure: Current   Smokeless tobacco: Never  Vaping Use   Vaping status: Never Used  Substance Use Topics   Alcohol use: No   Drug use: No   Allergies Penicillins, Sulfa antibiotics, Aspirin, Enoxaparin, Prednisone, and Venofer [iron sucrose]  Review of  Systems Review of  Systems  Constitutional:  Positive for appetite change. Negative for chills and fever.  Respiratory:  Negative for chest tightness and shortness of breath.   Cardiovascular:  Negative for chest pain.  Gastrointestinal:  Positive for abdominal pain, constipation, nausea and vomiting. Negative for diarrhea.  Genitourinary:  Positive for decreased urine volume. Negative for dysuria.  Musculoskeletal:  Positive for neck pain and neck stiffness.  Skin:  Negative for wound.  All other systems reviewed and are negative.   Physical Exam Vital Signs  I have reviewed the triage vital signs BP 98/83   Pulse (!) 117   Temp 98.7 F (37.1 C) (Oral)   Resp 20   Ht 5\' 2"  (1.575 m)   Wt 53.5 kg   SpO2 95%   BMI 21.58 kg/m  Physical Exam Vitals and nursing note reviewed.  Constitutional:      General: She is not in acute distress.    Appearance: Normal appearance.  HENT:     Head: Normocephalic and atraumatic.     Right Ear: External ear normal.     Left Ear: External ear normal.     Nose: Nose normal.     Mouth/Throat:     Mouth: Mucous membranes are moist.  Eyes:     General: No scleral icterus.       Right eye: No discharge.        Left eye: No discharge.  Neck:   Cardiovascular:     Rate and Rhythm: Normal rate and regular rhythm.     Pulses: Normal pulses.     Heart sounds: Normal heart sounds.  Pulmonary:     Effort: Pulmonary effort is normal. No respiratory distress.     Breath sounds: Normal breath sounds. No stridor.  Abdominal:     General: Abdomen is flat. There is distension.     Palpations: Abdomen is soft.     Tenderness: There is abdominal tenderness. There is no guarding or rebound.     Comments: Reduced bowel sounds  Not peritoneal abd    Musculoskeletal:     Cervical back: No rigidity or crepitus.     Right lower leg: No edema.     Left lower leg: No edema.  Skin:    General: Skin is warm and dry.     Capillary Refill: Capillary refill takes less than  2 seconds.  Neurological:     Mental Status: She is alert.  Psychiatric:        Mood and Affect: Mood normal.        Behavior: Behavior normal. Behavior is cooperative.     ED Results and Treatments Labs (all labs ordered are listed, but only abnormal results are displayed) Labs Reviewed  CBC WITH DIFFERENTIAL/PLATELET - Abnormal; Notable for the following components:      Result Value   WBC 20.2 (*)    RBC 3.44 (*)    Hemoglobin 11.1 (*)    HCT 35.6 (*)    MCV 103.5 (*)    Neutro Abs 16.7 (*)    Monocytes Absolute 1.5 (*)    Abs Immature Granulocytes 0.11 (*)    All other components within normal limits  COMPREHENSIVE METABOLIC PANEL - Abnormal; Notable for the following components:   Sodium 133 (*)    Chloride 94 (*)    Glucose, Bld 143 (*)    BUN 71 (*)    Creatinine, Ser 2.97 (*)    Calcium 7.9 (*)    Total  Protein 5.7 (*)    Albumin 2.6 (*)    AST 57 (*)    GFR, Estimated 18 (*)    Anion gap 16 (*)    All other components within normal limits  CBG MONITORING, ED - Abnormal; Notable for the following components:   Glucose-Capillary 140 (*)    All other components within normal limits  LIPASE, BLOOD  URINALYSIS, ROUTINE W REFLEX MICROSCOPIC                                                                                                                          Radiology DG Abdomen 1 View  Result Date: 05/07/2023 CLINICAL DATA:  Constipation. EXAM: ABDOMEN - 1 VIEW COMPARISON:  None Available. FINDINGS: Moderate-to-large colonic stool burden without evidence of enteric obstruction. Nondiagnostic evaluation for pneumoperitoneum secondary to exclusion of the lower thorax. No definite pneumatosis or portal venous gas. Surgical clips overlie the right upper abdominal quadrant. Phleboliths overlie the right hemipelvis. No definite acute osseous abnormalities. IMPRESSION: Moderate-to-large colonic stool burden without evidence of enteric obstruction. Electronically Signed    By: Simonne Come M.D.   On: 05/07/2023 14:40    Pertinent labs & imaging results that were available during my care of the patient were reviewed by me and considered in my medical decision making (see MDM for details).  Medications Ordered in ED Medications  sodium chloride 0.9 % bolus 1,000 mL (has no administration in time range)                                                                                                                                     Procedures .Critical Care  Performed by: Sloan Leiter, DO Authorized by: Sloan Leiter, DO   Critical care provider statement:    Critical care time (minutes):  45   Critical care time was exclusive of:  Separately billable procedures and treating other patients   Critical care was necessary to treat or prevent imminent or life-threatening deterioration of the following conditions:  Renal failure   Critical care was time spent personally by me on the following activities:  Development of treatment plan with patient or surrogate, discussions with consultants, evaluation of patient's response to treatment, examination of patient, ordering and review of laboratory studies, ordering and review of radiographic studies, ordering and performing treatments and interventions, pulse oximetry, re-evaluation of patient's condition, review of old  charts and obtaining history from patient or surrogate   (including critical care time)  Medical Decision Making / ED Course    Medical Decision Making:    Analiyah P Martucci is a 52 y.o. female with past medical history as below, significant for bipolar 1 disorder, COPD, chronic abdominal pain, chronic constipation, HLD, IDA, chronic pancreatitis who presents to the ED with complaint of abd pain, fall.. The complaint involves an extensive differential diagnosis and also carries with it a high risk of complications and morbidity.  Serious etiology was considered. Ddx includes but is not limited to:  Differential diagnosis includes but is not exclusive to ectopic pregnancy, ovarian cyst, ovarian torsion, acute appendicitis, urinary tract infection, endometriosis, bowel obstruction, hernia, colitis, renal colic, gastroenteritis, volvulus, sprain, strain, msk, fx, ich etc.   Complete initial physical exam performed, notably the patient  was tachycardia noted, afebrile, AOx3.    Reviewed and confirmed nursing documentation for past medical history, family history, social history.  Vital signs reviewed.    Clinical Course as of 05/07/23 1457  Fri May 07, 2023  1456 Hemoglobin(!): 11.1 Similar to prior [SG]  1457 WBC(!): 20.2 Tachycardia noted, afebrile [SG]  1457 Creatinine(!): 2.97 Baseline around 0.9-1.2, BUN 71; AKI, favor pre-renal given lack of PO intake, n/v [SG]    Clinical Course User Index [SG] Tanda Rockers A, DO     Labs abnormal, she has AKI, leukocytosis unclear source, does not appear septic. No bowel movement x3 wks, not passing flatus and having n/v. Get CTAP along with head and cervical spine. Pt will require admission.   She has chronic constipation but worse this time  She has SIRS at this time but no source of infection   Handoff to incoming EDP pending CT and admission.                  Additional history obtained: -Additional history obtained from spouse -External records from outside source obtained and reviewed including: Chart review including previous notes, labs, imaging, consultation notes including  Prior labs Home medications    Lab Tests: -I ordered, reviewed, and interpreted labs.   The pertinent results include:   Labs Reviewed  CBC WITH DIFFERENTIAL/PLATELET - Abnormal; Notable for the following components:      Result Value   WBC 20.2 (*)    RBC 3.44 (*)    Hemoglobin 11.1 (*)    HCT 35.6 (*)    MCV 103.5 (*)    Neutro Abs 16.7 (*)    Monocytes Absolute 1.5 (*)    Abs Immature Granulocytes 0.11 (*)    All other  components within normal limits  COMPREHENSIVE METABOLIC PANEL - Abnormal; Notable for the following components:   Sodium 133 (*)    Chloride 94 (*)    Glucose, Bld 143 (*)    BUN 71 (*)    Creatinine, Ser 2.97 (*)    Calcium 7.9 (*)    Total Protein 5.7 (*)    Albumin 2.6 (*)    AST 57 (*)    GFR, Estimated 18 (*)    Anion gap 16 (*)    All other components within normal limits  CBG MONITORING, ED - Abnormal; Notable for the following components:   Glucose-Capillary 140 (*)    All other components within normal limits  LIPASE, BLOOD  URINALYSIS, ROUTINE W REFLEX MICROSCOPIC    Notable for as above  EKG   EKG Interpretation Date/Time:    Ventricular Rate:    PR  Interval:    QRS Duration:    QT Interval:    QTC Calculation:   R Axis:      Text Interpretation:           Imaging Studies ordered: I ordered imaging studies including CTAP CTH CT c/s, kub  I independently visualized the following imaging with scope of interpretation limited to determining acute life threatening conditions related to emergency care; findings noted above I independently visualized and interpreted imaging. I agree with the radiologist interpretation   Medicines ordered and prescription drug management: Meds ordered this encounter  Medications   sodium chloride 0.9 % bolus 1,000 mL    -I have reviewed the patients home medicines and have made adjustments as needed   Consultations Obtained: na   Cardiac Monitoring: The patient was maintained on a cardiac monitor.  I personally viewed and interpreted the cardiac monitored which showed an underlying rhythm of: sinus tachy  Social Determinants of Health:  Diagnosis or treatment significantly limited by social determinants of health: current smoker   Reevaluation: After the interventions noted above, I reevaluated the patient and found that they have improved  Co morbidities that complicate the patient evaluation  Past Medical  History:  Diagnosis Date   Anemia    Anxiety    Asthma    Bipolar 1 disorder (HCC)    Chronic abdominal pain    COPD (chronic obstructive pulmonary disease) (HCC)    Depression    Diabetes mellitus    Diabetic neuropathy (HCC) 10/29/2017   Esophagitis    Hiatal hernia    Hyperlipidemia 02/03/2012   Iron deficiency anemia due to chronic blood loss 01/29/2022   Migraine    Neck pain 06/08/2012   Pancreatitis chronic Idiopathic   Treated by Dr. Margaretha Glassing at [facility] in the past with celiac blocks.      Dispostion: Disposition decision including need for hospitalization was considered, and patient disposition pending at time of sign out.    Final Clinical Impression(s) / ED Diagnoses Final diagnoses:  Fall, initial encounter  Injury of head, initial encounter  Cervical strain, acute, initial encounter  Chronic abdominal pain  Drug-induced constipation        Sloan Leiter, DO 05/07/23 2104

## 2023-05-07 NOTE — ED Notes (Signed)
Dr Deretha Emory made aware of pt low BP- requested fluid bolus order

## 2023-05-07 NOTE — ED Notes (Addendum)
[name], charge nurse attempted 2nd IV access- pt has limited accessTrey Paula, RN to attempt US guided IV- Dr Deretha Emory at bedside. Pt says her BP normally runs low and is on medication to raise BP, this med is not in chart as pt received care and this med from [facility] in [place] on last admission

## 2023-05-07 NOTE — Progress Notes (Signed)
Pharmacy Antibiotic Note  [name] is a 52 y.o. female admitted on 05/07/2023 with  unknown source .  Pharmacy has been consulted for Cefepime and Vancomycin dosing.  Plan: Vancomycin 1000 mg IV loading dose followed by Vancomycin 750 mg IV Q 48 hrs. Goal AUC 400-550. Expected AUC: 523 SCr used: 2.97 Expected Cmin: 13.7  Cefepime 2g IV q24h  Follow renal function closely, may need to adjust dosing   Height: 5\' 2"  (157.5 cm) Weight: 53.5 kg (118 lb) IBW/kg (Calculated) : 50.1  Temp (24hrs), Avg:98.6 F (37 C), Min:98.6 F (37 C), Max:98.7 F (37.1 C)  Recent Labs  Lab 05/07/23 1228 05/07/23 1958  WBC 20.2*  --   CREATININE 2.97*  --   LATICACIDVEN  --  2.2*    Estimated Creatinine Clearance: 17.1 mL/min (A) (by C-G formula based on SCr of 2.97 mg/dL (H)).    Allergies  Allergen Reactions   Penicillins Shortness Of Breath and Anaphylaxis    Tolerated ceftriaxone 03/27/23   Sulfa Antibiotics Shortness Of Breath   Aspirin Nausea And Vomiting   Enoxaparin Other (See Comments)    Has been known to cause her blood clots in her legs   Prednisone Other (See Comments)    Pancreatitis, but has to take for asthma sometimes   Venofer [Iron Sucrose] Nausea Only and Swelling    Antimicrobials this admission: Cefepime 10/11 >>  Vancomycin 10/11 >>   Dose adjustments this admission:  Microbiology results:  Thank you for allowing pharmacy to be a part of this patient's care.  Clovia Cuff, PharmD, BCPS 05/07/2023 9:41 PM

## 2023-05-07 NOTE — Progress Notes (Signed)
CODE SEPSIS - PHARMACY COMMUNICATION  **Broad Spectrum Antibiotics should be administered within 1 hour of Sepsis diagnosis**  Time Code Sepsis Called/Page Received: 20:59  Antibiotics Ordered: Cefepime, Vancomycin, and Metronidazole  Time of 1st antibiotic administration: Cefepime given at 21:00  Additional action taken by pharmacy: n/a  If necessary, Name of Provider/Nurse Contacted: n/a    Foye Deer ,PharmD Clinical Pharmacist  05/07/2023  9:35 PM

## 2023-05-07 NOTE — ED Provider Notes (Signed)
CRITICAL CARE Performed by: Vanetta Mulders Total critical care time: 40 minutes Critical care time was exclusive of separately billable procedures and treating other patients. Critical care was necessary to treat or prevent imminent or life-threatening deterioration. Critical care was time spent personally by me on the following activities: development of treatment plan with patient and/or surrogate as well as nursing, discussions with consultants, evaluation of patient's response to treatment, examination of patient, obtaining history from patient or surrogate, ordering and performing treatments and interventions, ordering and review of laboratory studies, ordering and review of radiographic studies, pulse oximetry and re-evaluation of patient's condition.  Patient was turned over to me for acute kidney injury and just seen with CT scan of the abdomen showed.  But patient got hypotensive.  Patient apparently started on Methergine after discharge from [facility].  Patient was discharged on doxycycline for wounds to the heel of her left foot which is still healing.  And also had wounds to her right foot.  Sounds as if she was in for sepsis.  She was discharged on doxycycline as stated and she completed that a few days ago.  Patient has chronic abdominal pain and has chronic constipation she is on chronic pain medicines.  Patient had received some IV fluids when she first got here pressures came up but then they were back down again.  She had a marked leukocytosis with a white count of 20,000 lactic acid was elevated some at 2.2.  Discussed concern for possible sepsis so ordered sepsis protocol for all fluids.  Blood pressure doing better.  Lactic acid came down to 1.5.  Urinalysis negative for urinary tract infection is mention she had a white count of 20,000 hemoglobin 11.1 platelets 280.  Complete metabolic panel GFR is 18.  This is markedly abnormal for her.  She has had acute kidney injury before.   LFTs looked relatively normal.  Lipase was 23.  Chest x-ray low lung volumes right upper lobe and bilateral basilar atelectasis.  Patient's had no cough or anything to suggest upper respiratory infection.  CT head was done because of the fall that occurred at home not sure why this happened but possibly could have gotten lightheaded from pressures being low.  That was negative and CT of the cervical spine also had no acute bony abnormalities.  Patient had small central disc protrusion at C4-C5 and disc bulge at C5-C6.  CT of the abdomen just showed extensive colonic stool compatible with constipation.  Patient's had no nausea or vomiting related to that.  Was started on broad-spectrum antibiotics.  Blood cultures are pending.  But seems to be stabilizing out.  Also did x-ray of her left heel reviewed that myself does not seem to be any bony involvement and actually that wound the skin is somewhat sealed over and does not appear deep.     Vanetta Mulders, MD 05/07/23 2350

## 2023-05-07 NOTE — ED Notes (Signed)
Pt confused in triage Family stated that she was septic 1 month ago for pneumonia. Family agrees with this RN that pt is not answering questions appropriately, nor "normal" for pt.  Pt repeating things No fever, increased HR.

## 2023-05-08 DIAGNOSIS — R109 Unspecified abdominal pain: Secondary | ICD-10-CM | POA: Diagnosis present

## 2023-05-08 DIAGNOSIS — F319 Bipolar disorder, unspecified: Secondary | ICD-10-CM | POA: Diagnosis present

## 2023-05-08 DIAGNOSIS — G8929 Other chronic pain: Secondary | ICD-10-CM | POA: Diagnosis present

## 2023-05-08 DIAGNOSIS — J449 Chronic obstructive pulmonary disease, unspecified: Secondary | ICD-10-CM | POA: Diagnosis not present

## 2023-05-08 DIAGNOSIS — I959 Hypotension, unspecified: Secondary | ICD-10-CM | POA: Diagnosis present

## 2023-05-08 DIAGNOSIS — R6510 Systemic inflammatory response syndrome (SIRS) of non-infectious origin without acute organ dysfunction: Secondary | ICD-10-CM | POA: Diagnosis present

## 2023-05-08 DIAGNOSIS — E039 Hypothyroidism, unspecified: Secondary | ICD-10-CM | POA: Diagnosis present

## 2023-05-08 DIAGNOSIS — L89611 Pressure ulcer of right heel, stage 1: Secondary | ICD-10-CM | POA: Diagnosis present

## 2023-05-08 DIAGNOSIS — J9811 Atelectasis: Secondary | ICD-10-CM | POA: Diagnosis present

## 2023-05-08 DIAGNOSIS — E860 Dehydration: Secondary | ICD-10-CM | POA: Insufficient documentation

## 2023-05-08 DIAGNOSIS — M50222 Other cervical disc displacement at C5-C6 level: Secondary | ICD-10-CM | POA: Diagnosis present

## 2023-05-08 DIAGNOSIS — W06XXXA Fall from bed, initial encounter: Secondary | ICD-10-CM | POA: Diagnosis present

## 2023-05-08 DIAGNOSIS — F419 Anxiety disorder, unspecified: Secondary | ICD-10-CM | POA: Diagnosis present

## 2023-05-08 DIAGNOSIS — E1140 Type 2 diabetes mellitus with diabetic neuropathy, unspecified: Secondary | ICD-10-CM | POA: Diagnosis present

## 2023-05-08 DIAGNOSIS — Y92009 Unspecified place in unspecified non-institutional (private) residence as the place of occurrence of the external cause: Secondary | ICD-10-CM | POA: Diagnosis not present

## 2023-05-08 DIAGNOSIS — E872 Acidosis, unspecified: Secondary | ICD-10-CM | POA: Diagnosis present

## 2023-05-08 DIAGNOSIS — E785 Hyperlipidemia, unspecified: Secondary | ICD-10-CM | POA: Diagnosis present

## 2023-05-08 DIAGNOSIS — K449 Diaphragmatic hernia without obstruction or gangrene: Secondary | ICD-10-CM | POA: Diagnosis present

## 2023-05-08 DIAGNOSIS — E876 Hypokalemia: Secondary | ICD-10-CM | POA: Diagnosis present

## 2023-05-08 DIAGNOSIS — Z8249 Family history of ischemic heart disease and other diseases of the circulatory system: Secondary | ICD-10-CM | POA: Diagnosis not present

## 2023-05-08 DIAGNOSIS — N179 Acute kidney failure, unspecified: Secondary | ICD-10-CM | POA: Diagnosis present

## 2023-05-08 DIAGNOSIS — D72829 Elevated white blood cell count, unspecified: Secondary | ICD-10-CM | POA: Diagnosis present

## 2023-05-08 DIAGNOSIS — K5903 Drug induced constipation: Secondary | ICD-10-CM | POA: Diagnosis not present

## 2023-05-08 DIAGNOSIS — J4489 Other specified chronic obstructive pulmonary disease: Secondary | ICD-10-CM | POA: Diagnosis present

## 2023-05-08 DIAGNOSIS — F17210 Nicotine dependence, cigarettes, uncomplicated: Secondary | ICD-10-CM | POA: Diagnosis present

## 2023-05-08 DIAGNOSIS — W19XXXA Unspecified fall, initial encounter: Secondary | ICD-10-CM

## 2023-05-08 DIAGNOSIS — K5900 Constipation, unspecified: Secondary | ICD-10-CM | POA: Insufficient documentation

## 2023-05-08 DIAGNOSIS — E871 Hypo-osmolality and hyponatremia: Secondary | ICD-10-CM | POA: Diagnosis present

## 2023-05-08 DIAGNOSIS — I10 Essential (primary) hypertension: Secondary | ICD-10-CM | POA: Diagnosis present

## 2023-05-08 DIAGNOSIS — K5909 Other constipation: Secondary | ICD-10-CM | POA: Diagnosis present

## 2023-05-08 LAB — PROCALCITONIN: Procalcitonin: 0.78 ng/mL

## 2023-05-08 LAB — COMPREHENSIVE METABOLIC PANEL
ALT: 42 U/L (ref 0–44)
AST: 66 U/L — ABNORMAL HIGH (ref 15–41)
Albumin: 1.9 g/dL — ABNORMAL LOW (ref 3.5–5.0)
Alkaline Phosphatase: 61 U/L (ref 38–126)
Anion gap: 9 (ref 5–15)
BUN: 38 mg/dL — ABNORMAL HIGH (ref 6–20)
CO2: 22 mmol/L (ref 22–32)
Calcium: 7.2 mg/dL — ABNORMAL LOW (ref 8.9–10.3)
Chloride: 103 mmol/L (ref 98–111)
Creatinine, Ser: 1.23 mg/dL — ABNORMAL HIGH (ref 0.44–1.00)
GFR, Estimated: 52 mL/min — ABNORMAL LOW (ref >60)
Glucose, Bld: 230 mg/dL — ABNORMAL HIGH (ref 70–99)
Potassium: 3.3 mmol/L — ABNORMAL LOW (ref 3.5–5.1)
Sodium: 134 mmol/L — ABNORMAL LOW (ref 135–145)
Total Bilirubin: 0.4 mg/dL (ref 0.3–1.2)
Total Protein: 4.2 g/dL — ABNORMAL LOW (ref 6.5–8.1)

## 2023-05-08 LAB — GLUCOSE, CAPILLARY: Glucose-Capillary: 98 mg/dL (ref 70–99)

## 2023-05-08 LAB — CBC WITH DIFFERENTIAL/PLATELET
Abs Immature Granulocytes: 0.03 10*3/uL (ref 0.00–0.07)
Basophils Absolute: 0 10*3/uL (ref 0.0–0.1)
Basophils Relative: 0 %
Eosinophils Absolute: 0.1 10*3/uL (ref 0.0–0.5)
Eosinophils Relative: 1 %
HCT: 32.1 % — ABNORMAL LOW (ref 36.0–46.0)
Hemoglobin: 10.1 g/dL — ABNORMAL LOW (ref 12.0–15.0)
Immature Granulocytes: 0 %
Lymphocytes Relative: 12 %
Lymphs Abs: 1.4 10*3/uL (ref 0.7–4.0)
MCH: 32.8 pg (ref 26.0–34.0)
MCHC: 31.5 g/dL (ref 30.0–36.0)
MCV: 104.2 fL — ABNORMAL HIGH (ref 80.0–100.0)
Monocytes Absolute: 0.8 10*3/uL (ref 0.1–1.0)
Monocytes Relative: 7 %
Neutro Abs: 9 10*3/uL — ABNORMAL HIGH (ref 1.7–7.7)
Neutrophils Relative %: 80 %
Platelets: 186 10*3/uL (ref 150–400)
RBC: 3.08 MIL/uL — ABNORMAL LOW (ref 3.87–5.11)
RDW: 14.3 % (ref 11.5–15.5)
WBC: 11.2 10*3/uL — ABNORMAL HIGH (ref 4.0–10.5)
nRBC: 0 % (ref 0.0–0.2)

## 2023-05-08 LAB — MAGNESIUM: Magnesium: 1.4 mg/dL — ABNORMAL LOW (ref 1.7–2.4)

## 2023-05-08 LAB — CREATININE, SERUM
Creatinine, Ser: 1.61 mg/dL — ABNORMAL HIGH (ref 0.44–1.00)
GFR, Estimated: 38 mL/min — ABNORMAL LOW (ref >60)

## 2023-05-08 MED ORDER — BUSPIRONE HCL 15 MG PO TABS
15.0000 mg | ORAL_TABLET | Freq: Two times a day (BID) | ORAL | Status: DC
  Administered 2023-05-08 – 2023-05-09 (×3): 15 mg via ORAL
  Filled 2023-05-08 (×2): qty 1
  Filled 2023-05-08: qty 3

## 2023-05-08 MED ORDER — LEVOTHYROXINE SODIUM 25 MCG PO TABS
25.0000 ug | ORAL_TABLET | Freq: Every day | ORAL | Status: DC
  Administered 2023-05-09: 25 ug via ORAL
  Filled 2023-05-08: qty 1

## 2023-05-08 MED ORDER — FLUTICASONE FUROATE-VILANTEROL 100-25 MCG/ACT IN AEPB
1.0000 | INHALATION_SPRAY | Freq: Every day | RESPIRATORY_TRACT | Status: DC
  Administered 2023-05-09: 1 via RESPIRATORY_TRACT
  Filled 2023-05-08: qty 28

## 2023-05-08 MED ORDER — AMITRIPTYLINE HCL 25 MG PO TABS
100.0000 mg | ORAL_TABLET | Freq: Every day | ORAL | Status: DC
  Administered 2023-05-08: 100 mg via ORAL
  Filled 2023-05-08: qty 4

## 2023-05-08 MED ORDER — BISACODYL 10 MG RE SUPP
10.0000 mg | Freq: Once | RECTAL | Status: AC
  Administered 2023-05-08: 10 mg via RECTAL
  Filled 2023-05-08: qty 1

## 2023-05-08 MED ORDER — MAGNESIUM SULFATE 4 GM/100ML IV SOLN
4.0000 g | Freq: Once | INTRAVENOUS | Status: AC
  Administered 2023-05-08: 4 g via INTRAVENOUS
  Filled 2023-05-08: qty 100

## 2023-05-08 MED ORDER — ARIPIPRAZOLE 10 MG PO TABS
10.0000 mg | ORAL_TABLET | Freq: Every day | ORAL | Status: DC
  Administered 2023-05-08 – 2023-05-09 (×2): 10 mg via ORAL
  Filled 2023-05-08: qty 1
  Filled 2023-05-08: qty 2

## 2023-05-08 MED ORDER — SODIUM CHLORIDE 0.9 % IV SOLN
2.0000 g | Freq: Two times a day (BID) | INTRAVENOUS | Status: DC
  Administered 2023-05-08: 2 g via INTRAVENOUS
  Filled 2023-05-08: qty 12.5

## 2023-05-08 MED ORDER — VANCOMYCIN HCL IN DEXTROSE 1-5 GM/200ML-% IV SOLN: 1000.0000 mg | INTRAVENOUS | Status: DC

## 2023-05-08 MED ORDER — PANTOPRAZOLE SODIUM 40 MG PO TBEC
40.0000 mg | DELAYED_RELEASE_TABLET | Freq: Two times a day (BID) | ORAL | Status: DC
  Administered 2023-05-08 – 2023-05-09 (×3): 40 mg via ORAL
  Filled 2023-05-08 (×3): qty 1

## 2023-05-08 MED ORDER — METHYLNALTREXONE BROMIDE 12 MG/0.6ML ~~LOC~~ SOLN: 12.0000 mg | Freq: Once | SUBCUTANEOUS | Status: DC

## 2023-05-08 MED ORDER — HEPARIN SODIUM (PORCINE) 5000 UNIT/ML IJ SOLN
5000.0000 [IU] | Freq: Three times a day (TID) | INTRAMUSCULAR | Status: DC
  Administered 2023-05-08 – 2023-05-09 (×3): 5000 [IU] via SUBCUTANEOUS
  Filled 2023-05-08 (×3): qty 1

## 2023-05-08 MED ORDER — MIDODRINE HCL 5 MG PO TABS
5.0000 mg | ORAL_TABLET | Freq: Three times a day (TID) | ORAL | Status: DC
  Administered 2023-05-08 – 2023-05-09 (×4): 5 mg via ORAL
  Filled 2023-05-08 (×4): qty 1

## 2023-05-08 MED ORDER — GABAPENTIN 300 MG PO CAPS
300.0000 mg | ORAL_CAPSULE | Freq: Three times a day (TID) | ORAL | Status: DC
  Administered 2023-05-08 – 2023-05-09 (×4): 300 mg via ORAL
  Filled 2023-05-08 (×4): qty 1

## 2023-05-08 MED ORDER — FOLIC ACID 1 MG PO TABS
1.0000 mg | ORAL_TABLET | Freq: Every day | ORAL | Status: DC
  Administered 2023-05-08 – 2023-05-09 (×2): 1 mg via ORAL
  Filled 2023-05-08 (×2): qty 1

## 2023-05-08 MED ORDER — ONDANSETRON HCL 4 MG PO TABS: 4.0000 mg | ORAL_TABLET | Freq: Four times a day (QID) | ORAL | Status: DC | PRN

## 2023-05-08 MED ORDER — ATORVASTATIN CALCIUM 20 MG PO TABS
20.0000 mg | ORAL_TABLET | Freq: Every day | ORAL | Status: DC
  Administered 2023-05-08 – 2023-05-09 (×2): 20 mg via ORAL
  Filled 2023-05-08: qty 2
  Filled 2023-05-08: qty 1

## 2023-05-08 MED ORDER — ACETAMINOPHEN 325 MG PO TABS
650.0000 mg | ORAL_TABLET | Freq: Four times a day (QID) | ORAL | Status: DC | PRN
  Administered 2023-05-09: 650 mg via ORAL
  Filled 2023-05-08: qty 2

## 2023-05-08 MED ORDER — ACETAMINOPHEN 650 MG RE SUPP: 650.0000 mg | Freq: Four times a day (QID) | RECTAL | Status: DC | PRN

## 2023-05-08 MED ORDER — POLYETHYLENE GLYCOL 3350 17 G PO PACK
17.0000 g | PACK | Freq: Two times a day (BID) | ORAL | Status: DC
  Administered 2023-05-08 – 2023-05-09 (×2): 17 g via ORAL
  Filled 2023-05-08 (×2): qty 1

## 2023-05-08 MED ORDER — ONDANSETRON HCL 4 MG/2ML IJ SOLN
4.0000 mg | Freq: Four times a day (QID) | INTRAMUSCULAR | Status: DC | PRN
  Administered 2023-05-08: 4 mg via INTRAVENOUS
  Filled 2023-05-08: qty 2

## 2023-05-08 MED ORDER — NICOTINE 21 MG/24HR TD PT24
21.0000 mg | MEDICATED_PATCH | Freq: Every day | TRANSDERMAL | Status: DC
  Administered 2023-05-08 – 2023-05-09 (×2): 21 mg via TRANSDERMAL
  Filled 2023-05-08 (×2): qty 1

## 2023-05-08 MED ORDER — OXYCODONE HCL 5 MG PO TABS
5.0000 mg | ORAL_TABLET | ORAL | Status: DC | PRN
  Administered 2023-05-08 – 2023-05-09 (×3): 5 mg via ORAL
  Filled 2023-05-08 (×3): qty 1

## 2023-05-08 NOTE — Assessment & Plan Note (Addendum)
Positive hypotension plan to hold on antihypertensive medications.  At home on lisinopril.  Continue blood pressure control with midodrine.

## 2023-05-08 NOTE — Assessment & Plan Note (Addendum)
No signs of exacerbation Continue oxymetry monitoring.

## 2023-05-08 NOTE — Assessment & Plan Note (Addendum)
Patient with severe obstipation, triggering systemic inflammatory response.   Patient had a bowel movement with improvement in her symptoms. She has been afebrile, and wbc at the time of her discharge is 6.7 Cultures have been no growth.   Plan to continue bowel regimen with miralax and as needed dulcolax. Follow up as outpatient with primary care.

## 2023-05-08 NOTE — Assessment & Plan Note (Signed)
Continue with abilify, amitriptyline and buspirone.

## 2023-05-08 NOTE — ED Notes (Signed)
Pt given a meal tray

## 2023-05-08 NOTE — Assessment & Plan Note (Addendum)
Renal function with serum cr down to 1,61 Mag is 1,4.   Plan to add 4 g mag sulfate IV today. Check renal function and electrolytes today. Continue IV fluids for hydration.

## 2023-05-08 NOTE — ED Notes (Signed)
Pt assisted to the bathroom via wheel chair

## 2023-05-08 NOTE — Progress Notes (Signed)
Foam protective dressings placed to bilateral heels; foam protective dressing applied to r back. Patient has two small skin tears (<0.5cm) to right hand; open to air. Patient instructed by Florentina Addison, LPN primary nurse to call and not get up on her own. Patient expressed understanding.

## 2023-05-08 NOTE — Assessment & Plan Note (Deleted)
-   Patient takes oxycodone regularly at home - Last dose was the morning of presentation - She has tried MiraLAX without success at home - [employer]

## 2023-05-08 NOTE — Assessment & Plan Note (Deleted)
-   Continue IV fluids - Monitor creatinine - Continue treatment for constipation

## 2023-05-08 NOTE — Assessment & Plan Note (Signed)
-   Trip and fall - CT head and C-spine showed no acute changes - CT C-spine does show central disc protrusion at C4-5 and disc bulge at C5-6 - Patient should follow-up outpatient for these findings - PT eval and treat

## 2023-05-08 NOTE — Assessment & Plan Note (Signed)
-   Smokes about half a pack per day - Nicotine patch ordered - Continue to monitor

## 2023-05-08 NOTE — Assessment & Plan Note (Addendum)
Continue with statin therapy.  ?

## 2023-05-08 NOTE — H&P (Signed)
History and Physical    Patient: [name] MWU:[id number] DOB: 1970-11-27 DOA: 05/07/2023 DOS: the patient was seen and examined on 05/08/2023 PCP: Elfredia Nevins, MD  Patient coming from: Home  Chief Complaint:  Chief Complaint  Patient presents with   Constipation   HPI: [name] is a 52 y.o. female with medical history significant of anxiety, bipolar 1 disorder, COPD, depression, diabetes mellitus type 2, hyperlipidemia, neck pain, chronic abdominal pain, presents to the ED with a chief complaint of pain.  Patient reports she has had generalized weakness secondary to chronic pain.  The pain is in her abdomen, back, neck.  This episode started 3 nights ago.  Patient denies diarrhea, nausea, vomiting.  She reports her last bowel movement was 3 weeks ago.  She has taken MiraLAX which usually works for her.  This time it just made her abdomen hurt more with cramping and twisting.  She had no result from the MiraLAX.  Patient denies any fever.  She has had a decrease in appetite that started gradually getting worse a month ago.  Patient reports no urinary retention.  She did have a fall today.  She reports she was getting out of bed and went to step over her comforter when she tripped on it and fell.  Patient reports that in the past she has broken her back in 2 places 1 time in 3 places a different time.  She could swear she broke her neck per her report.  CT C-spine shows no acute fracture or subluxation.  Patient reports she did hit her head.  She had no loss of consciousness.  She had no change in vision or acute neurodeficits.  She does report it hurts to lift her head up off the pillow.  This is a muscular pain from what I can tell.  She also has limited range of motion turning her head to the left compared to turning her head to the right.  At home she takes oxycodone for pain.  Her last dose was on the day of presentation in the morning.  Patient is a smoker.  She does not  drink.  Patient is full code. Review of Systems: As mentioned in the history of present illness. All other systems reviewed and are negative. Past Medical History:  Diagnosis Date   Anemia    Anxiety    Asthma    Bipolar 1 disorder (HCC)    Chronic abdominal pain    COPD (chronic obstructive pulmonary disease) (HCC)    Depression    Diabetes mellitus    Diabetic neuropathy (HCC) 10/29/2017   Esophagitis    Hiatal hernia    Hyperlipidemia 02/03/2012   Iron deficiency anemia due to chronic blood loss 01/29/2022   Migraine    Neck pain 06/08/2012   Pancreatitis chronic Idiopathic   Treated by Dr. Margaretha Glassing at [facility] in the past with celiac blocks.   Past Surgical History:  Procedure Laterality Date   ABDOMINAL HYSTERECTOMY     attempted colonoscopy  02/2017   Dr. Margaretha Glassing: Poor prep, scope passed to mid transverse colon before colonoscopy aborted.  No significant findings noted to mid transverse colon.  Next colonoscopy in 2 years.   CELIAC PLEXUS BLOCK     CHOLECYSTECTOMY     COLONOSCOPY N/A 12/05/2012   VOZ:[id number] rectum, colon and terminal ileum   ESOPHAGOGASTRODUODENOSCOPY  02/20/2008   [id number]:[id number] distal esophageal mucosa, suspicious for neoplasm/Hiatal hernia otherwise normal    ESOPHAGOGASTRODUODENOSCOPY  04/03/2008  ZOX:[id number] hiatal hernia, otherwise normal/Short, tight, benign-appearing peptic stricture   ESOPHAGOGASTRODUODENOSCOPY  November 16, 2012   Dr. Teena Dunk: hiatal hernia, esophagitis,    ESOPHAGOGASTRODUODENOSCOPY (EGD) WITH PROPOFOL N/A 05/09/2019   Dr. Darrick Penna: Large hiatal hernia with erythema and edema in the pouch, mild gastritis.  No biopsies taken.   ESOPHAGOGASTRODUODENOSCOPY (EGD) WITH PROPOFOL N/A 05/26/2019   Procedure: ESOPHAGOGASTRODUODENOSCOPY (EGD) WITH PROPOFOL;  Surgeon: West Bali, MD;  Location: [facility];  Service: Endoscopy;  Laterality: N/A;   EUS  12/2018   Dr. Margaretha Glassing: LA grade B esophagitis, antritis but negative H.  pylori, 1 cm ulcer at the duodenal sweep   GIVENS CAPSULE STUDY N/A 12/05/2012   Few superficial erosions but nothing found to explain iron deficiency anemia.    GIVENS CAPSULE STUDY N/A 05/26/2019   Procedure: GIVENS CAPSULE STUDY;  Surgeon: West Bali, MD;  Location: [facility];  Service: Endoscopy;  Laterality: N/A;   Ileocolonoscopy  06/05/2008     JYN:[id number] anal canal, otherwise normal rectum, colon   TONSILLECTOMY     Social History:  reports that she has been smoking cigarettes. She has a 25 pack-year smoking history. She has been exposed to tobacco smoke. She has never used smokeless tobacco. She reports that she does not drink alcohol and does not use drugs.  Allergies  Allergen Reactions   Penicillins Shortness Of Breath and Anaphylaxis    Tolerated ceftriaxone 03/27/23   Sulfa Antibiotics Shortness Of Breath   Aspirin Nausea And Vomiting   Enoxaparin Other (See Comments)    Has been known to cause her blood clots in her legs   Prednisone Other (See Comments)    Pancreatitis, but has to take for asthma sometimes   Venofer [Iron Sucrose] Nausea Only and Swelling    Family History  Problem Relation Age of Onset   Asthma Other    Asthma Paternal Grandfather    Heart attack Paternal Grandfather    Hypertension Mother    Cancer Maternal Grandmother    Diabetes Paternal Grandmother    Coronary artery disease Neg Hx    Colon cancer Neg Hx     Prior to Admission medications   Medication Sig Start Date End Date Taking? Authorizing Provider  albuterol (PROVENTIL HFA;VENTOLIN HFA) 108 (90 BASE) MCG/ACT inhaler Inhale 2 puffs into the lungs daily as needed for wheezing or shortness of breath.   Yes [provider]  amitriptyline (ELAVIL) 100 MG tablet Take 100 mg by mouth at bedtime.   Yes [provider]  ARIPiprazole (ABILIFY) 10 MG tablet Take 10 mg by mouth daily. 03/22/23  Yes [provider]  atorvastatin (LIPITOR) 20 MG tablet Take 20 mg  by mouth daily.   Yes [provider]  budesonide-formoterol (SYMBICORT) 80-4.5 MCG/ACT inhaler Inhale 2 puffs into the lungs 2 (two) times daily. Patient taking differently: Inhale 2 puffs into the lungs daily. 03/30/14  Yes Standley Brooking, MD  busPIRone (BUSPAR) 15 MG tablet Take 15 mg by mouth 2 (two) times daily. 03/22/23  Yes [provider]  folic acid (FOLVITE) 1 MG tablet Take 1 mg by mouth daily. 04/15/23  Yes [provider]  furosemide (LASIX) 20 MG tablet Take 20 mg by mouth 2 (two) times daily. 04/19/23  Yes [provider]  gabapentin (NEURONTIN) 300 MG capsule Take 300 mg by mouth 3 (three) times daily. 04/11/19  Yes [provider]  levothyroxine (SYNTHROID, LEVOTHROID) 25 MCG tablet Take 25 mcg by mouth daily before  breakfast.   Yes [provider]  lisinopril (ZESTRIL) 2.5 MG tablet Take 2.5 mg by mouth daily. 04/19/23  Yes [provider]  metFORMIN (GLUCOPHAGE) 500 MG tablet Take 2 tablets (1,000 mg total) by mouth 2 (two) times daily. 04/01/23  Yes Pahwani, Daleen Bo, MD  methocarbamol (ROBAXIN) 500 MG tablet Take 1,000 mg by mouth in the morning and at bedtime.   Yes [provider]  morphine (MS CONTIN) 15 MG 12 hr tablet Take 15 mg by mouth 2 (two) times daily. 03/08/23  Yes [provider]  oxycodone (ROXICODONE) 30 MG immediate release tablet Take 30 mg by mouth every 4 (four) hours as needed for pain. 04/07/23  Yes [provider]  Pancrelipase, Lip-Prot-Amyl, (ZENPEP) 20000-63000 units CPEP Take 2 capsules by mouth in the morning and at bedtime. 09/11/19  Yes [provider]  pantoprazole (PROTONIX) 40 MG tablet Take 40 mg by mouth 2 (two) times daily.   Yes [provider]  polyethylene glycol powder (GLYCOLAX/MIRALAX) powder Take 17 g by mouth 2 (two) times daily. Until daily soft stools  OTC Patient taking differently: Take 17 g by mouth as needed for mild constipation.  Until daily soft stools  OTC 03/05/15  Yes Barrett Henle, PA-C  saccharomyces boulardii (FLORASTOR) 250 MG capsule Take 250 mg by mouth every evening. 04/14/23  Yes [provider]  valACYclovir (VALTREX) 500 MG tablet Take 500 mg by mouth daily. 04/19/23  Yes [provider]  DULoxetine (CYMBALTA) 60 MG capsule Take 60 mg by mouth daily. Patient not taking: Reported on 05/07/2023 06/24/22   [provider]    Physical Exam: Vitals:   05/08/23 0300 05/08/23 0345 05/08/23 0500 05/08/23 0545  BP: (!) 90/56 (!) 85/50 (!) 107/56 96/60  Pulse: 96 91 97 90  Resp: 13 12 (!) 21 14  Temp:      TempSrc:      SpO2: 90% 90% 91% 92%  Weight:      Height:       1.  General: Patient lying supine in bed,  no acute distress   2. Psychiatric: Alert and oriented x 3, mood and behavior normal for situation, pleasant and cooperative with exam   3. Neurologic: Speech and language are normal, face is symmetric, moves all 4 extremities voluntarily, at baseline without acute deficits on limited exam   4. HEENMT:  Head is atraumatic, normocephalic, pupils reactive to light, posterior neck is tender in the perispinal muscles, trachea is midline, mucous membranes are moist   5. Respiratory : Lungs are clear to auscultation bilaterally without wheezing, rhonchi, rales, no cyanosis, no increase in work of breathing or accessory muscle use   6. Cardiovascular : Heart rate normal, rhythm is regular, no murmurs, rubs or gallops, no peripheral edema, peripheral pulses palpated   7. Gastrointestinal:  Abdomen is soft, nondistended, tenderness to palpation in the lower abdomen bilaterally, bowel sounds active, no masses or organomegaly palpated   8. Skin:  Skin is warm, dry and intact without rashes, acute lesions, or ulcers on limited exam   9.Musculoskeletal:  No acute deformities or trauma, no asymmetry in tone, no peripheral edema, peripheral pulses palpated, no  tenderness to palpation in the extremities  Data Reviewed: In the ED Patient is afebrile, tachycardic at 117, hypotensive at 84/47 with a lactic acidosis of 2.2 and a leukocytosis of 20.2 Procalcitonin is unequivocal at 0.78 Blood cultures pending UA is not indicative of UTI CT abdomen shows constipation CT head  and C-spine showed no acute intracranial abnormality no fracture or subluxation in the C-spine Chest x-ray shows moderate to large colonic stool burden without evidence of enteric obstruction Admission requested for SIRS criteria and AKI   Assessment and Plan: * SIRS (systemic inflammatory response syndrome) (HCC) - Heart rate 117, leukocytosis 20.2, lactic acidosis 2.2, hypotension 84/47 - Started on broad-spectrum antibiotics with vancomycin, cefepime, Flagyl - No source of infection identified yet - Blood culture pending - CT abdomen just showed constipation - Lactic acid normalized to 1.5 - Continue to monitor  Dehydration - Continue IV fluids - Monitor creatinine - Continue treatment for constipation  Fall at home, initial encounter - Trip and fall - CT head and C-spine showed no acute changes - CT C-spine does show central disc protrusion at C4-5 and disc bulge at C5-6 - Patient should follow-up outpatient for these findings - PT eval and treat  AKI (acute kidney injury) (HCC) - Creatinine increased from 0.79>> 2.  9 7 - Secondary to dehydration - Continue IV fluids - Hold nephrotoxic agents when possible - Continue to monitor  Hypothyroidism - Continue Synthroid  COPD (chronic obstructive pulmonary disease) (HCC) - Continue formulary equivalent of Cymbalta  Hyperlipidemia - Continue statin  Tobacco use disorder - Smokes about half a pack per day - Nicotine patch ordered - Continue to monitor  Constipation - Patient takes oxycodone regularly at home - Last dose was the morning of presentation - She has tried MiraLAX without success at home -  [employer]  Essential hypertension - Holding lisinopril in the setting of hypotension in the ER      Advance Care Planning:   Code Status: Full Code  Consults: None at this time  Family Communication: no family at bedside  Severity of Illness: The appropriate patient status for this patient is INPATIENT. Inpatient status is judged to be reasonable and necessary in order to provide the required intensity of service to ensure the patient's safety. The patient's presenting symptoms, physical exam findings, and initial radiographic and laboratory data in the context of their chronic comorbidities is felt to place them at high risk for further clinical deterioration. Furthermore, it is not anticipated that the patient will be medically stable for discharge from the hospital within 2 midnights of admission.   * I certify that at the point of admission it is my clinical judgment that the patient will require inpatient hospital care spanning beyond 2 midnights from the point of admission due to high intensity of service, high risk for further deterioration and high frequency of surveillance required.*  Author: Lilyan Gilford, DO 05/08/2023 6:37 AM  For on call review [email or web address].

## 2023-05-08 NOTE — Hospital Course (Addendum)
[name] was admitted to the hospital with the working diagnosis of systemic inflammatory response syndrome.   52 yo female with the past medical history of COPD, T2DM, dyslipidemia, and chronic abdominal pain who presented with uncontrolled abdominal pain and back pain. Decreased po intake and poor mobility. On the day of admission she fell while trying to get out of bed. On her initial physical examination her blood pressure was 90/56, HR 96, RR 21 and 02 saturation 90%. Lungs with no wheezing or rales, heart with S1 and S2 present and regular, abdomen with no distention, positive tender to palpation at the lower quadrants, with no rebound or guarding, no lower extremity edema.   Na 133, K 4,2 Cl 94, bicarbonate 23, glucose 143, bun 71, cr 2,97  Mg. 1.4  AST 57, ALT 38 Lipase 23  Lactic acid 2,2 and 1,5  Procalcitonin 0,78  Wbc 20,2 hgb 11,1 plt 280  Urine analysis SG 1,015, protein negative, negative leukocytes and negative hgb.   Chest radiograph with hypoinflation, left basal atelectasis, with no infiltrates or effusions.   EKG 116 bpm, normal axis, right bundle branch, sinus rhythm with no significant ST segment or T wave changes.   Left foot radiograph with heel ulcer, no radiographic evidence for acute osseous abnormality.   CT abdomen with extensive colonic stool load compatible with constipation, no evidence of obstruction.  Moderate hiatal hernia.

## 2023-05-08 NOTE — Progress Notes (Signed)
Progress Note   Patient: [name] ZOX:[id number] DOB: 1971/02/09 DOA: 05/07/2023     0 DOS: the patient was seen and examined on 05/08/2023   Brief hospital course: Mrs. Rentschler was admitted to the hospital with the working diagnosis of systemic inflammatory response syndrome.   52 yo female with the past medical history of COPD, T2DM, dyslipidemia, and chronic abdominal pain who presented with uncontrolled abdominal pain and back pain. Decreased po intake and poor mobility. On the day of admission she fell while trying to get out of bed. On her initial physical examination her blood pressure was 90/56, HR 96, RR 21 and 02 saturation 90%. Lungs with no wheezing or rales, heart with S1 and S2 present and regular, abdomen with no distention, positive tender to palpation at the lower quadrants, with no rebound or guarding, no lower extremity edema.   Na 133, K 4,2 Cl 94, bicarbonate 23, glucose 143, bun 71, cr 2,97  Mg. 1.4  AST 57, ALT 38 Lipase 23  Lactic acid 2,2 and 1,5  Procalcitonin 0,78  Wbc 20,2 hgb 11,1 plt 280  Urine analysis SG 1,015, protein negative, negative leukocytes and negative hgb.   Chest radiograph with hypoinflation, left basal atelectasis, with no infiltrates or effusions.   EKG 116 bpm, normal axis, right bundle branch, sinus rhythm with no significant ST segment or T wave changes.   Left foot radiograph with heel ulcer, no radiographic evidence for acute osseous abnormality.   CT abdomen with extensive colonic stool load compatible with constipation, no evidence of obstruction.  Moderate hiatal hernia.     Assessment and Plan: * SIRS (systemic inflammatory response syndrome) (HCC) Leukocytosis has improved.  Patient with severe obstipation,   Plan to continue bowel regimen with miralax, and will add dulcolax suppository. Continue supportive medical therapy with IV fluids.  Hold on antibiotic therapy for now.  Ok to admit to medical ward.   AKI  (acute kidney injury) (HCC) Renal function with serum cr down to 1,61 Mag is 1,4.   Plan to add 4 g mag sulfate IV today. Check renal function and electrolytes today. Continue IV fluids for hydration.   Essential hypertension Positive hypotension plan to hold on antihypertensive medications.  At home on lisinopril.  Continue blood pressure control with midodrine.   COPD (chronic obstructive pulmonary disease) (HCC) No signs of exacerbation. Continue oxymetry monitoring.   Hyperlipidemia Continue with statin therapy.   Hypothyroidism - Continue Synthroid  Tobacco use disorder - Smokes about half a pack per day - Nicotine patch ordered - Continue to monitor  Bipolar disorder (HCC) Continue with abilify and buspirone.   Fall at home, initial encounter - Trip and fall - CT head and C-spine showed no acute changes - CT C-spine does show central disc protrusion at C4-5 and disc bulge at C5-6 - Patient should follow-up outpatient for these findings - PT eval and treat        Subjective: Patient is feeling better, but not yet back to baseline, no bowel movement yet, no nausea or vomiting,   Physical Exam: Vitals:   05/08/23 1502 05/08/23 1515 05/08/23 1530 05/08/23 1545  BP:  106/63 91/67 102/64  Pulse:  94 97 91  Resp:  12 15 15   Temp: 97.8 F (36.6 C)     TempSrc: Oral     SpO2:  94% 96% 90%  Weight:      Height:       Neurology awake and alert ENT with mild  pallor Cardiovascular with S1 and S2 present and regular Respiratory with no rales or wheezing, no rhonchi Abdomen with no distention  No lower extremity edema  Data Reviewed:    Family Communication: no family at the bedside   Disposition: Status is: Inpatient Remains inpatient appropriate because: obstipation   Planned Discharge Destination: Home     Author: Coralie Keens, MD 05/08/2023 3:56 PM  For on call review [email or web address].

## 2023-05-08 NOTE — Assessment & Plan Note (Signed)
Continue Synthroid °

## 2023-05-08 NOTE — ED Notes (Signed)
Pt asking for pain medicine, says she is hurting all over and takes oxy-

## 2023-05-09 DIAGNOSIS — N179 Acute kidney failure, unspecified: Secondary | ICD-10-CM | POA: Diagnosis not present

## 2023-05-09 DIAGNOSIS — F17200 Nicotine dependence, unspecified, uncomplicated: Secondary | ICD-10-CM

## 2023-05-09 DIAGNOSIS — J449 Chronic obstructive pulmonary disease, unspecified: Secondary | ICD-10-CM

## 2023-05-09 DIAGNOSIS — R6510 Systemic inflammatory response syndrome (SIRS) of non-infectious origin without acute organ dysfunction: Secondary | ICD-10-CM

## 2023-05-09 DIAGNOSIS — I10 Essential (primary) hypertension: Secondary | ICD-10-CM | POA: Diagnosis not present

## 2023-05-09 DIAGNOSIS — E039 Hypothyroidism, unspecified: Secondary | ICD-10-CM

## 2023-05-09 LAB — BASIC METABOLIC PANEL
Anion gap: 10 (ref 5–15)
BUN: 29 mg/dL — ABNORMAL HIGH (ref 6–20)
CO2: 21 mmol/L — ABNORMAL LOW (ref 22–32)
Calcium: 7.5 mg/dL — ABNORMAL LOW (ref 8.9–10.3)
Chloride: 105 mmol/L (ref 98–111)
Creatinine, Ser: 0.98 mg/dL (ref 0.44–1.00)
GFR, Estimated: >60 mL/min (ref >60)
Glucose, Bld: 85 mg/dL (ref 70–99)
Potassium: 3.4 mmol/L — ABNORMAL LOW (ref 3.5–5.1)
Sodium: 136 mmol/L (ref 135–145)

## 2023-05-09 LAB — CBC WITH DIFFERENTIAL/PLATELET
Abs Immature Granulocytes: 0.04 10*3/uL (ref 0.00–0.07)
Basophils Absolute: 0 10*3/uL (ref 0.0–0.1)
Basophils Relative: 0 %
Eosinophils Absolute: 0.3 10*3/uL (ref 0.0–0.5)
Eosinophils Relative: 5 %
HCT: 32.7 % — ABNORMAL LOW (ref 36.0–46.0)
Hemoglobin: 10.3 g/dL — ABNORMAL LOW (ref 12.0–15.0)
Immature Granulocytes: 1 %
Lymphocytes Relative: 25 %
Lymphs Abs: 1.7 10*3/uL (ref 0.7–4.0)
MCH: 32.8 pg (ref 26.0–34.0)
MCHC: 31.5 g/dL (ref 30.0–36.0)
MCV: 104.1 fL — ABNORMAL HIGH (ref 80.0–100.0)
Monocytes Absolute: 0.5 10*3/uL (ref 0.1–1.0)
Monocytes Relative: 8 %
Neutro Abs: 4.1 10*3/uL (ref 1.7–7.7)
Neutrophils Relative %: 61 %
Platelets: 152 10*3/uL (ref 150–400)
RBC: 3.14 MIL/uL — ABNORMAL LOW (ref 3.87–5.11)
RDW: 14.1 % (ref 11.5–15.5)
WBC: 6.7 10*3/uL (ref 4.0–10.5)
nRBC: 0 % (ref 0.0–0.2)

## 2023-05-09 LAB — MAGNESIUM: Magnesium: 2.3 mg/dL (ref 1.7–2.4)

## 2023-05-09 MED ORDER — POLYETHYLENE GLYCOL 3350 17 G PO PACK: 17.0000 g | PACK | Freq: Two times a day (BID) | ORAL | 0 refills | Status: AC

## 2023-05-09 MED ORDER — POTASSIUM CHLORIDE CRYS ER 20 MEQ PO TBCR
40.0000 meq | EXTENDED_RELEASE_TABLET | ORAL | Status: DC
  Administered 2023-05-09: 40 meq via ORAL
  Filled 2023-05-09: qty 2

## 2023-05-09 MED ORDER — BISACODYL 10 MG RE SUPP: 10.0000 mg | Freq: Every day | RECTAL | 0 refills | Status: DC | PRN

## 2023-05-09 MED ORDER — MIDODRINE HCL 5 MG PO TABS: 5.0000 mg | ORAL_TABLET | Freq: Three times a day (TID) | ORAL | 0 refills | Status: AC

## 2023-05-09 MED ORDER — BISACODYL 10 MG RE SUPP
10.0000 mg | Freq: Once | RECTAL | Status: AC
  Administered 2023-05-09: 10 mg via RECTAL
  Filled 2023-05-09: qty 1

## 2023-05-09 NOTE — Progress Notes (Signed)
Discharge instructions given patient verbalized understanding. Awaiting ride to arrive to pick patient up. Ivs removed.

## 2023-05-09 NOTE — Evaluation (Addendum)
Physical Therapy Brief Evaluation and Discharge Note Patient Details Name: [name] MRN: [id number] DOB: 11/20/1970 Today's Date: 05/09/2023   History of Present Illness  a 52 y.o. female with medical history significant of anxiety, bipolar 1 disorder, COPD, depression, diabetes mellitus type 2, hyperlipidemia, neck pain, chronic abdominal pain, presents to the ED with a chief complaint of pain.  Patient reports she has had generalized weakness secondary to chronic pain.  The pain is in her abdomen, back, neck.  This episode started 3 nights ago.  Patient denies diarrhea, nausea, vomiting.  She reports her last bowel movement was 3 weeks ago.  She has taken MiraLAX which usually works for her.  This time it just made her abdomen hurt more with cramping and twisting.  She had no result from the MiraLAX.  Patient denies any fever.  She has had a decrease in appetite that started gradually getting worse a month ago.  Patient reports no urinary retention.  She did have a fall today.  She reports she was getting out of bed and went to step over her comforter when she tripped on it and fell.  Patient reports that in the past she has broken her back in 2 places 1 time in 3 places a different time.  She could swear she broke her neck per her report.  CT C-spine shows no acute fracture or subluxation.  Patient reports she did hit her head.  She had no loss of consciousness.  She had no change in vision or acute neurodeficits.  She does report it hurts to lift her head up off the pillow.  This is a muscular pain from what I can tell.  She also has limited range of motion turning her head to the left compared to turning her head to the right.  At home she takes oxycodone for pain.  Her last dose was on the day of presentation in the morning  Clinical Impression  Patient screen and is performing at functional baseline, no safety concerns noted throughout evaluation.  Pt noted with LOB with dynamic balance  reactions but seems normal as pt with history of peripheral neuropathy. Patient is safe to DC home with appropriate services, DME, and any other needs indicated. Recommending Outpatient PT evaluation for balance deficits. Other than balance deficits, no functional limitations to keep pt from DC home. PT to sign off at this time.  Please reconsult acute PT services if functional changes occur while admitted.  Thank you.       PT Assessment    Assistance Needed at Discharge       Equipment Recommendations None recommended by PT  Recommendations for Other Services       Precautions/Restrictions Precautions Precautions: None Restrictions Weight Bearing Restrictions: No        Mobility  Bed Mobility          Transfers Overall transfer level: Independent                 General transfer comment: 2x sit/stands from EOB, no balance concerns, safe.    Ambulation/Gait Ambulation/Gait assistance: Modified independent (Device/Increase time) Gait Distance (Feet): 30 Feet   Gait Pattern/deviations: WFL(Within Functional Limits)   General Gait Details: 47ft RW, no cues, safe and steady. Appropriat gait  Home Activity Instructions    Stairs            Modified Rankin (Stroke Patients Only)        Balance   Sitting-balance support: No upper  extremity supported Sitting balance-Leahy Scale: Normal Sitting balance - Comments: Normal sitting balance     Standing balance-Leahy Scale: Fair Standing balance comment: fair/fair standing balance without AD.          Pertinent Vitals/Pain   Pain Assessment Pain Assessment: No/denies pain     Home Living   Living Arrangements: Alone       Home Equipment: Rolling Walker (2 wheels);Shower seat        Prior Function        UE/LE Assessment               Communication   Communication Communication: No apparent difficulties     Cognition         General Comments      Exercises      Assessment/Plan    PT Problem List Decreased balance       PT Visit Diagnosis Unsteadiness on feet (R26.81)    No Skilled PT     Co-evaluation                AMPAC 6 Clicks Help needed turning from your back to your side while in a flat bed without using bedrails?: None Help needed moving from lying on your back to sitting on the side of a flat bed without using bedrails?: None Help needed moving to and from a bed to a chair (including a wheelchair)?: None Help needed standing up from a chair using your arms (e.g., wheelchair or bedside chair)?: None Help needed to walk in hospital room?: None Help needed climbing 3-5 steps with a railing? : None 6 Click Score: 24      End of Session   Activity Tolerance: Patient tolerated treatment well Patient left: in bed;with nursing/sitter in room Nurse Communication: Mobility status PT Visit Diagnosis: Unsteadiness on feet (R26.81)     Time: 3235-5732 PT Time Calculation (min) (ACUTE ONLY): 6 min  Charges:          Nelida Meuse PT, DPT Physical Therapist with [facility] [phone] office   Nelida Meuse  05/09/2023, 11:12 AM

## 2023-05-09 NOTE — Discharge Summary (Addendum)
Physician Discharge Summary   Patient: [name] MRN: [id number] DOB: [birth year]  Admit date:     05/07/2023  Discharge date: 05/09/23  Discharge Physician: York Ram Simrin Vegh   PCP: Elfredia Nevins, MD   Recommendations at discharge:    Patient will continue po Miralax bid and as needed dulcolax suppository for bowel regimen. Continue midodrine for blood pressure support. Hold on lisinopril to prevent hypotension. Hold on furosemide to prevent electrolyte abnormalities.  Follow up with Dr Denzil Magnuson in 7 to 10 days.  Follow up with home health services.   Discharge Diagnoses: Principal Problem:   SIRS (systemic inflammatory response syndrome) (HCC) Active Problems:   AKI (acute kidney injury) (HCC)   Essential hypertension   COPD (chronic obstructive pulmonary disease) (HCC)   Hyperlipidemia   Hypothyroidism   Tobacco use disorder   Bipolar disorder (HCC)  Resolved Problems:   * No resolved hospital problems. [facility] Course: [name] was admitted to the hospital with the working diagnosis of systemic inflammatory response syndrome, due to obstipation.   52 yo female with the past medical history of COPD, T2DM, dyslipidemia, and chronic abdominal pain who presented with uncontrolled abdominal pain and back pain. Decreased po intake and poor mobility. On the day of admission she fell while trying to get out of bed. On her initial physical examination her blood pressure was 90/56, HR 96, RR 21 and 02 saturation 90%. Lungs with no wheezing or rales, heart with S1 and S2 present and regular, abdomen with no distention, positive tender to palpation at the lower quadrants, with no rebound or guarding, no lower extremity edema.   Na 133, K 4,2 Cl 94, bicarbonate 23, glucose 143, bun 71, cr 2,97  Mg. 1.4  AST 57, ALT 38 Lipase 23  Lactic acid 2,2 and 1,5  Procalcitonin 0,78  Wbc 20,2 hgb 11,1 plt 280  Urine analysis SG 1,015, protein negative, negative leukocytes  and negative hgb.   Chest radiograph with hypoinflation, left basal atelectasis, with no infiltrates or effusions.   EKG 116 bpm, normal axis, right bundle branch, sinus rhythm with no significant ST segment or T wave changes.   Left foot radiograph with heel ulcer, no radiographic evidence for acute osseous abnormality.   CT abdomen with extensive colonic stool load compatible with constipation, no evidence of obstruction.  Moderate hiatal hernia.   Patient ruled out for sepsis and antibiotic therapy was discontinued.   10/13 patient was placed on bowel regimen and IV fluids with improvement in her symptoms. Plan to continue bowel regimen as outpatient and close follow up with primary care.   Assessment and Plan: * SIRS (systemic inflammatory response syndrome) (HCC) Patient with severe obstipation, triggering systemic inflammatory response.   Patient had a bowel movement with improvement in her symptoms. She has been afebrile, and wbc at the time of her discharge is 6.7 Cultures have been no growth.   Plan to continue bowel regimen with miralax and as needed dulcolax. Follow up as outpatient with primary care.   AKI (acute kidney injury) (HCC) Hyponatremia, Hypokalemia and hypomagnesemia.   Patient had IV fluids with improvement in her renal function. Had kcl and Mg sulfate for electrolyte correction. At the time of her discharge her renal function had a serum cr of 0,98 with K at 3,4 and serum bicarbonate at 21. Na 136 and Mg 2,3  She will get 40 meq Kcl x2 prior to her discharge. Hold on loop diuretic to prevent further electrolyte abnormalities.  Follow up renal function and electrolytes as outpatient.     Essential hypertension Patient will resume midodrine, hold on lisinopril due to hypotension. Follow up with primary care as outpatient.   2023 echocardiogram with preserved LV systolic function with EF 60 to 65%, RV systolic function preserved, RVSP 25.8 mmHg, mild  mitral valve regurgitation.   COPD (chronic obstructive pulmonary disease) (HCC) No signs of exacerbation. Continue with bronchodilator therapy as outpatient.   Hyperlipidemia Continue with statin therapy.   Hypothyroidism - Continue Synthroid  Tobacco use disorder - Smokes about half a pack per day - Nicotine patch ordered - Continue to monitor  Bipolar disorder (HCC) Continue with abilify, amitriptyline and buspirone.   Fall at home, initial encounter - Trip and fall - CT head and C-spine showed no acute changes - CT C-spine does show central disc protrusion at C4-5 and disc bulge at C5-6 - Patient should follow-up outpatient for these findings - PT eval and treat   Stage 1 pressure ulcer right heel, present on admission, follow up as outpatient.       Consultants: none  Procedures performed: none   Disposition: Home Diet recommendation:  Discharge Diet Orders (From admission, onward)     Start     Ordered   05/09/23 0000  Diet - low sodium heart healthy        05/09/23 1037           Cardiac diet DISCHARGE MEDICATION: Allergies as of 05/09/2023       Reactions   Penicillins Shortness Of Breath, Anaphylaxis   Tolerated ceftriaxone 03/27/23   Sulfa Antibiotics Shortness Of Breath   Aspirin Nausea And Vomiting   Enoxaparin Other (See Comments)   Has been known to cause her blood clots in her legs   Prednisone Other (See Comments)   Pancreatitis, but has to take for asthma sometimes   Venofer [iron Sucrose] Nausea Only, Swelling        Medication List     STOP taking these medications    furosemide 20 MG tablet Commonly known as: LASIX   lisinopril 2.5 MG tablet Commonly known as: ZESTRIL   polyethylene glycol powder 17 GM/SCOOP powder Commonly known as: GLYCOLAX/MIRALAX Replaced by: polyethylene glycol 17 g packet       TAKE these medications    albuterol 108 (90 Base) MCG/ACT inhaler Commonly known as: VENTOLIN HFA Inhale 2 puffs  into the lungs daily as needed for wheezing or shortness of breath.   amitriptyline 100 MG tablet Commonly known as: ELAVIL Take 100 mg by mouth at bedtime.   ARIPiprazole 10 MG tablet Commonly known as: ABILIFY Take 10 mg by mouth daily.   atorvastatin 20 MG tablet Commonly known as: LIPITOR Take 20 mg by mouth daily.   bisacodyl 10 MG suppository Commonly known as: DULCOLAX Place 1 suppository (10 mg total) rectally daily as needed for moderate constipation or severe constipation.   budesonide-formoterol 80-4.5 MCG/ACT inhaler Commonly known as: Symbicort Inhale 2 puffs into the lungs 2 (two) times daily. What changed: when to take this   busPIRone 15 MG tablet Commonly known as: BUSPAR Take 15 mg by mouth 2 (two) times daily.   DULoxetine 60 MG capsule Commonly known as: CYMBALTA Take 60 mg by mouth daily.   folic acid 1 MG tablet Commonly known as: FOLVITE Take 1 mg by mouth daily.   gabapentin 300 MG capsule Commonly known as: NEURONTIN Take 300 mg by mouth 3 (three) times daily.   levothyroxine  25 MCG tablet Commonly known as: SYNTHROID Take 25 mcg by mouth daily before breakfast.   metFORMIN 500 MG tablet Commonly known as: GLUCOPHAGE Take 2 tablets (1,000 mg total) by mouth 2 (two) times daily.   methocarbamol 500 MG tablet Commonly known as: ROBAXIN Take 1,000 mg by mouth in the morning and at bedtime.   midodrine 5 MG tablet Commonly known as: PROAMATINE Take 1 tablet (5 mg total) by mouth 3 (three) times daily with meals.   morphine 15 MG 12 hr tablet Commonly known as: MS CONTIN Take 15 mg by mouth 2 (two) times daily.   oxycodone 30 MG immediate release tablet Commonly known as: ROXICODONE Take 30 mg by mouth every 4 (four) hours as needed for pain.   pantoprazole 40 MG tablet Commonly known as: PROTONIX Take 40 mg by mouth 2 (two) times daily.   polyethylene glycol 17 g packet Commonly known as: MIRALAX / GLYCOLAX Take 17 g by mouth 2  (two) times daily. Replaces: polyethylene glycol powder 17 GM/SCOOP powder   saccharomyces boulardii 250 MG capsule Commonly known as: FLORASTOR Take 250 mg by mouth every evening.   valACYclovir 500 MG tablet Commonly known as: VALTREX Take 500 mg by mouth daily.   Zenpep 20000-63000 units Cpep Generic drug: Pancrelipase (Lip-Prot-Amyl) Take 2 capsules by mouth in the morning and at bedtime.        Discharge Exam: Filed Weights   05/07/23 1206  Weight: 53.5 kg   BP 122/75 (BP Location: Right Arm)   Pulse 76   Temp 98.1 F (36.7 C)   Resp 16   Ht 5\' 2"  (1.575 m)   Wt 53.5 kg   SpO2 100%   BMI 21.58 kg/m   Patient is feeling better, positive bowel movement with improvement in her symptoms.   Neurology awake and alert ENT with no pallor Cardiovascular with S1 and S2 present and regular with no gallops, rubs or murmurs Respiratory with no rales or wheezing, no rhonchi Abdomen with no distention, no rebound or guarding, non tender to superficial palpation  No lower extremity edema   Condition at discharge: stable  The results of significant diagnostics from this hospitalization (including imaging, microbiology, ancillary and laboratory) are listed below for reference.   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 No acute fracture is seen. Stable sclerosis proximal shaft of fourth metatarsal. Heel ulcer. No soft tissue gas. No erosive changes of the calcaneus. Vascular calcifications IMPRESSION: 1. Heel ulcer. No radiographic evidence for acute osseous abnormality 2. 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 Electronically Signed   By: Jasmine Pang M.D.   On: 05/08/2023 00:11   DG Chest Port 1 View  Result Date: 05/07/2023 CLINICAL DATA:  Constipation with nausea and vomiting.  EXAM: PORTABLE CHEST 1 VIEW COMPARISON:  April 05, 2023 FINDINGS: The heart size and mediastinal contours are within normal limits. Low lung volumes are noted. Mild atelectasis is seen within the right upper lobe and bilateral lung bases. No acute infiltrate, pleural effusion or pneumothorax is identified. Radiopaque surgical clips are seen overlying the right upper quadrant. The visualized skeletal structures are unremarkable. IMPRESSION: Low lung volumes with mild right upper lobe and bilateral basilar atelectasis. Electronically Signed   By: Aram Candela M.D.   On: 05/07/2023 22:40   CT ABDOMEN PELVIS WO CONTRAST  Result Date: 05/07/2023 CLINICAL DATA:  Left lower quadrant abdominal pain. No bowel movement for 3 weeks. Emesis, query small-bowel obstruction EXAM: CT ABDOMEN AND PELVIS WITHOUT CONTRAST TECHNIQUE: Multidetector CT imaging of the abdomen and pelvis was performed following the standard protocol without IV contrast. RADIATION DOSE REDUCTION: This exam was performed according to the departmental dose-optimization program which includes automated exposure control, adjustment of the mA and/or kV according to patient size and/or use of iterative reconstruction technique. COMPARISON:  CT abdomen and pelvis 03/27/2023 FINDINGS: Lower chest: Scarring and bronchiectasis/bronchiolectasis in the lower lungs. Hepatobiliary: Cholecystectomy. Unremarkable noncontrast appearance of the liver. No biliary dilation. Pancreas: Atrophic pancreas.  No acute abnormality. Spleen: Unremarkable. Adrenals/Urinary Tract: Unremarkable adrenal glands and kidneys. Bladder is within normal limits. Stomach/Bowel: Moderate hiatal hernia. Extensive colonic stool load. No definite bowel wall thickening on noncontrast exam. No evidence of obstruction. Appendix is normal. Vascular/Lymphatic: Aortic atherosclerosis. No enlarged abdominal or pelvic lymph nodes. Reproductive: Unremarkable. Other: No free intraperitoneal fluid  or air. Musculoskeletal: No acute fracture. Chronic compression fractures of T12 and L1. IMPRESSION: 1. Extensive colonic stool load compatible with constipation. No evidence of obstruction. 2. Moderate hiatal hernia. Aortic Atherosclerosis (ICD10-I70.0). Electronically Signed   By: Minerva Fester M.D.   On: 05/07/2023 17:19   CT Head Wo Contrast  Result Date: 05/07/2023 CLINICAL DATA:  Head trauma, moderate-severe; Neck trauma, midline tenderness (Age 14-64y) EXAM: CT HEAD WITHOUT CONTRAST CT CERVICAL SPINE WITHOUT CONTRAST TECHNIQUE: Multidetector CT imaging of the head and cervical spine was performed following the standard protocol without intravenous contrast. Multiplanar CT image reconstructions of the cervical spine were also generated. RADIATION DOSE REDUCTION: This exam was performed according to the departmental dose-optimization program which includes automated exposure control, adjustment of the mA and/or kV according to patient size and/or use of iterative reconstruction technique. COMPARISON:  None Available. FINDINGS: CT HEAD FINDINGS Brain: No evidence of acute infarction, hemorrhage, hydrocephalus, extra-axial collection or mass lesion/mass effect. Vascular: No hyperdense vessel or unexpected calcification. Skull: Normal. Negative for fracture or focal lesion. Sinuses/Orbits: No middle ear or mastoid effusion. Paranasal sinuses are clear. Orbits are unremarkable. Other: None. CT CERVICAL SPINE FINDINGS Alignment: Trace retrolisthesis of C5 on C6. Skull base and vertebrae: No acute fracture. No primary bone lesion or focal pathologic process. Soft tissues and spinal canal: No prevertebral fluid or swelling. No visible canal hematoma. Disc levels: Small central disc protrusion at C4-C5 and a disc bulge at C5-C6. Upper chest: Negative. Other: Known IMPRESSION: 1. No acute intracranial abnormality. 2. No acute fracture or traumatic subluxation of the cervical spine. 3. Small central disc  protrusion at C4-C5 and a disc bulge at C5-C6. Electronically Signed   By: Lorenza Cambridge M.D.   On: 05/07/2023 17:01   CT Cervical Spine Wo Contrast  Result Date: 05/07/2023 CLINICAL DATA:  Head trauma, moderate-severe; Neck trauma, midline tenderness (Age 14-64y) EXAM: CT HEAD WITHOUT CONTRAST CT CERVICAL SPINE WITHOUT CONTRAST TECHNIQUE: Multidetector CT imaging of the head and cervical spine was performed following the standard protocol without intravenous contrast. Multiplanar CT image reconstructions of the cervical spine were also generated. RADIATION DOSE REDUCTION: This exam was performed according to the departmental dose-optimization program which includes automated exposure control, adjustment of the mA and/or kV according to patient size and/or use of iterative reconstruction technique. COMPARISON:  None Available. FINDINGS: CT HEAD FINDINGS Brain: No evidence of acute infarction, hemorrhage, hydrocephalus, extra-axial collection or mass lesion/mass effect. Vascular: No hyperdense vessel or unexpected calcification. Skull: Normal. Negative for  fracture or focal lesion. Sinuses/Orbits: No middle ear or mastoid effusion. Paranasal sinuses are clear. Orbits are unremarkable. Other: None. CT CERVICAL SPINE FINDINGS Alignment: Trace retrolisthesis of C5 on C6. Skull base and vertebrae: No acute fracture. No primary bone lesion or focal pathologic process. Soft tissues and spinal canal: No prevertebral fluid or swelling. No visible canal hematoma. Disc levels: Small central disc protrusion at C4-C5 and a disc bulge at C5-C6. Upper chest: Negative. Other: Known IMPRESSION: 1. No acute intracranial abnormality. 2. No acute fracture or traumatic subluxation of the cervical spine. 3. Small central disc protrusion at C4-C5 and a disc bulge at C5-C6. Electronically Signed   By: Lorenza Cambridge M.D.   On: 05/07/2023 17:01   DG Abdomen 1 View  Result Date: 05/07/2023 CLINICAL DATA:  Constipation. EXAM: ABDOMEN  - 1 VIEW COMPARISON:  None Available. FINDINGS: Moderate-to-large colonic stool burden without evidence of enteric obstruction. Nondiagnostic evaluation for pneumoperitoneum secondary to exclusion of the lower thorax. No definite pneumatosis or portal venous gas. Surgical clips overlie the right upper abdominal quadrant. Phleboliths overlie the right hemipelvis. No definite acute osseous abnormalities. IMPRESSION: Moderate-to-large colonic stool burden without evidence of enteric obstruction. Electronically Signed   By: Simonne Come M.D.   On: 05/07/2023 14:40    Microbiology: Results for orders placed or performed during the hospital encounter of 05/07/23  Culture, blood (Routine X 2) w Reflex to ID Panel     Status: None (Preliminary result)   Collection Time: 05/07/23  7:42 PM   Specimen: BLOOD RIGHT HAND  Result Value Ref Range Status   Specimen Description BLOOD RIGHT HAND  Final   Special Requests   Final    BOTTLES DRAWN AEROBIC ONLY Blood Culture adequate volume   Culture   Final    NO GROWTH 2 DAYS Performed at [facility], [address] [place], [place] [address]    Report Status PENDING  Incomplete  Culture, blood (Routine X 2) w Reflex to ID Panel     Status: None (Preliminary result)   Collection Time: 05/07/23  9:30 PM   Specimen: BLOOD LEFT FOREARM  Result Value Ref Range Status   Specimen Description BLOOD LEFT FOREARM  Final   Special Requests   Final    BOTTLES DRAWN AEROBIC AND ANAEROBIC Blood Culture adequate volume   Culture   Final    NO GROWTH 2 DAYS Performed at [facility], [address] [place], [place] [address]    Report Status PENDING  Incomplete    Labs: CBC: Recent Labs  Lab 05/07/23 1228 05/08/23 1056 05/09/23 0523  WBC 20.2* 11.2* 6.7  NEUTROABS 16.7* 9.0* 4.1  HGB 11.1* 10.1* 10.3*  HCT 35.6* 32.1* 32.7*  MCV 103.5* 104.2* 104.1*  PLT 280 186 152   Basic Metabolic Panel: Recent Labs  Lab 05/07/23 1228 05/08/23 0554 05/08/23 1056  05/08/23 2105 05/09/23 0523  NA 133*  --   --  134* 136  K 4.2  --   --  3.3* 3.4*  CL 94*  --   --  103 105  CO2 23  --   --  22 21*  GLUCOSE 143*  --   --  230* 85  BUN 71*  --   --  38* 29*  CREATININE 2.97* 1.61*  --  1.23* 0.98  CALCIUM 7.9*  --   --  7.2* 7.5*  MG  --   --  1.4*  --  2.3   Liver Function Tests: Recent  Labs  Lab 05/07/23 1228 05/08/23 2105  AST 57* 66*  ALT 38 42  ALKPHOS 75 61  BILITOT 0.7 0.4  PROT 5.7* 4.2*  ALBUMIN 2.6* 1.9*   CBG: Recent Labs  Lab 05/07/23 1214 05/08/23 1700  GLUCAP 140* 98    Discharge time spent: greater than 30 minutes.  Signed: Coralie Keens, MD [facility] 05/09/2023

## 2023-05-09 NOTE — Progress Notes (Signed)
   05/09/23 1045  TOC Brief Assessment  Insurance and Status Reviewed  Patient has primary care physician Yes  Home environment has been reviewed Home  Prior level of function: Independent  Prior/Current Home Services No current home services  Social Determinants of Health Reivew SDOH reviewed no interventions necessary  Readmission risk has been reviewed Yes  Transition of care needs no transition of care needs at this time

## 2023-05-12 LAB — CULTURE, BLOOD (ROUTINE X 2)
Culture: NO GROWTH
Culture: NO GROWTH
Special Requests: ADEQUATE
Special Requests: ADEQUATE

## 2023-05-31 ENCOUNTER — Other Ambulatory Visit: Payer: Self-pay

## 2023-05-31 DIAGNOSIS — Z122 Encounter for screening for malignant neoplasm of respiratory organs: Secondary | ICD-10-CM

## 2023-05-31 DIAGNOSIS — Z87891 Personal history of nicotine dependence: Secondary | ICD-10-CM

## 2023-05-31 DIAGNOSIS — F17210 Nicotine dependence, cigarettes, uncomplicated: Secondary | ICD-10-CM

## 2023-06-16 ENCOUNTER — Encounter (HOSPITAL_COMMUNITY): Payer: Self-pay | Admitting: Hematology

## 2023-06-17 ENCOUNTER — Ambulatory Visit (HOSPITAL_COMMUNITY): Payer: Medicare Other

## 2023-07-05 ENCOUNTER — Emergency Department (HOSPITAL_COMMUNITY)
Admission: EM | Admit: 2023-07-05 | Discharge: 2023-07-05 | Discharge status: Against Medical Advice | Payer: Medicare Other | Attending: Emergency Medicine | Admitting: Emergency Medicine

## 2023-07-05 ENCOUNTER — Other Ambulatory Visit: Payer: Self-pay

## 2023-07-05 DIAGNOSIS — I10 Essential (primary) hypertension: Secondary | ICD-10-CM | POA: Diagnosis not present

## 2023-07-05 DIAGNOSIS — J45909 Unspecified asthma, uncomplicated: Secondary | ICD-10-CM | POA: Diagnosis not present

## 2023-07-05 DIAGNOSIS — J449 Chronic obstructive pulmonary disease, unspecified: Secondary | ICD-10-CM | POA: Diagnosis not present

## 2023-07-05 DIAGNOSIS — Z01812 Encounter for preprocedural laboratory examination: Secondary | ICD-10-CM | POA: Diagnosis present

## 2023-07-05 DIAGNOSIS — Z0189 Encounter for other specified special examinations: Secondary | ICD-10-CM

## 2023-07-05 DIAGNOSIS — Z5329 Procedure and treatment not carried out because of patient's decision for other reasons: Secondary | ICD-10-CM | POA: Insufficient documentation

## 2023-07-05 DIAGNOSIS — Z7984 Long term (current) use of oral hypoglycemic drugs: Secondary | ICD-10-CM | POA: Diagnosis not present

## 2023-07-05 LAB — COMPREHENSIVE METABOLIC PANEL
ALT: 38 U/L (ref 0–44)
AST: 74 U/L — ABNORMAL HIGH (ref 15–41)
Albumin: 1.7 g/dL — ABNORMAL LOW (ref 3.5–5.0)
Alkaline Phosphatase: 82 U/L (ref 38–126)
Anion gap: 9 (ref 5–15)
BUN: 14 mg/dL (ref 6–20)
CO2: 28 mmol/L (ref 22–32)
Calcium: 7.4 mg/dL — ABNORMAL LOW (ref 8.9–10.3)
Chloride: 99 mmol/L (ref 98–111)
Creatinine, Ser: 0.76 mg/dL (ref 0.44–1.00)
GFR, Estimated: >60 mL/min (ref >60)
Glucose, Bld: 138 mg/dL — ABNORMAL HIGH (ref 70–99)
Potassium: 3.5 mmol/L (ref 3.5–5.1)
Sodium: 136 mmol/L (ref 135–145)
Total Bilirubin: 0.5 mg/dL (ref <1.2)
Total Protein: 4.8 g/dL — ABNORMAL LOW (ref 6.5–8.1)

## 2023-07-05 MED ORDER — SODIUM CHLORIDE 0.9 % IV BOLUS: 1000.0000 mL | Freq: Once | INTRAVENOUS | Status: DC

## 2023-07-05 NOTE — Discharge Instructions (Signed)
You were seen in the emergency department to have your blood work drawn You will need to follow-up with your primary doctor for the results of your blood work Return to the emergency department for any other concerns

## 2023-07-05 NOTE — ED Notes (Signed)
Pt allowed for 1 IV stick but after unsuccessful attempt, refused to allow for a 2nd. Stated she would "just leave" if she couldn't get her [place] IV. Charge nurse in room at this time doing [place] IV for pt.

## 2023-07-05 NOTE — ED Triage Notes (Signed)
Pt was told by PCP to come have blood work done and fax results to PCP.

## 2023-07-05 NOTE — ED Provider Notes (Signed)
[facility] Provider Note   CSN: [id number] Arrival date & time: 07/05/23  6295     History  Chief Complaint  Patient presents with   needs blood work    [name] is a 52 y.o. female.  With a history of hypotension, COPD, asthma and chronic pancreatitis who presents to the ED requesting lab work.  She was recently admitted to a facility in [place] for hypotension and discharged at the end of November.  Her PCP had ordered routine lab work (CMP and CBC today and she was directed here given the need for ultrasound IV access.  The patient is felt feels well overall.  She notes she feels a little weak and dehydrated but has not had any headaches, chest pain, shortness of breath, abdominal pain, fevers or GI symptoms.  HPI     Home Medications Prior to Admission medications   Medication Sig Start Date End Date Taking? Authorizing Provider  albuterol (PROVENTIL HFA;VENTOLIN HFA) 108 (90 BASE) MCG/ACT inhaler Inhale 2 puffs into the lungs daily as needed for wheezing or shortness of breath.    [provider]  amitriptyline (ELAVIL) 100 MG tablet Take 100 mg by mouth at bedtime.    [provider]  ARIPiprazole (ABILIFY) 10 MG tablet Take 10 mg by mouth daily. 03/22/23   [provider]  atorvastatin (LIPITOR) 20 MG tablet Take 20 mg by mouth daily.    [provider]  bisacodyl (DULCOLAX) 10 MG suppository Place 1 suppository (10 mg total) rectally daily as needed for moderate constipation or severe constipation. 05/09/23   Arrien, York Ram, MD  budesonide-formoterol [facility]) 80-4.5 MCG/ACT inhaler Inhale 2 puffs into the lungs 2 (two) times daily. Patient taking differently: Inhale 2 puffs into the lungs daily. 03/30/14   Standley Brooking, MD  busPIRone (BUSPAR) 15 MG tablet Take 15 mg by mouth 2 (two) times daily. 03/22/23   [provider]  DULoxetine (CYMBALTA) 60 MG  capsule Take 60 mg by mouth daily. Patient not taking: Reported on 05/07/2023 06/24/22   [provider]  folic acid (FOLVITE) 1 MG tablet Take 1 mg by mouth daily. 04/15/23   [provider]  gabapentin (NEURONTIN) 300 MG capsule Take 300 mg by mouth 3 (three) times daily. 04/11/19   [provider]  levothyroxine (SYNTHROID, LEVOTHROID) 25 MCG tablet Take 25 mcg by mouth daily before breakfast.    [provider]  metFORMIN (GLUCOPHAGE) 500 MG tablet Take 2 tablets (1,000 mg total) by mouth 2 (two) times daily. 04/01/23   Hughie Closs, MD  methocarbamol (ROBAXIN) 500 MG tablet Take 1,000 mg by mouth in the morning and at bedtime.    [provider]  morphine (MS CONTIN) 15 MG 12 hr tablet Take 15 mg by mouth 2 (two) times daily. 03/08/23   [provider]  oxycodone (ROXICODONE) 30 MG immediate release tablet Take 30 mg by mouth every 4 (four) hours as needed for pain. 04/07/23   [provider]  Pancrelipase, Lip-Prot-Amyl, (ZENPEP) 20000-63000 units CPEP Take 2 capsules by mouth in the morning and at bedtime. 09/11/19   [provider]  pantoprazole (PROTONIX) 40 MG tablet Take 40 mg by mouth 2 (two) times daily.    [provider]  saccharomyces boulardii (FLORASTOR) 250 MG capsule Take 250 mg by mouth every evening. 04/14/23   [provider]  valACYclovir (VALTREX) 500 MG tablet Take 500 mg by mouth daily.  04/19/23   [provider]      Allergies    Penicillins, Sulfa antibiotics, Aspirin, Enoxaparin, Prednisone, and Venofer [iron sucrose]    Review of Systems   Review of Systems  Physical Exam Updated Vital Signs BP 125/83 (BP Location: Right Arm)   Pulse (!) 102   Temp 98.7 F (37.1 C) (Oral)   Resp 20   Ht 5\' 2"  (1.575 m)   Wt 43.5 kg   SpO2 99%   BMI 17.56 kg/m  Physical Exam Vitals and nursing note reviewed.  HENT:     Head: Normocephalic and atraumatic.  Eyes:     Pupils:  Pupils are equal, round, and reactive to light.  Cardiovascular:     Rate and Rhythm: Normal rate and regular rhythm.  Pulmonary:     Effort: Pulmonary effort is normal.     Breath sounds: Normal breath sounds.  Abdominal:     Palpations: Abdomen is soft.     Tenderness: There is no abdominal tenderness.  Skin:    General: Skin is warm and dry.  Neurological:     Mental Status: She is alert.  Psychiatric:        Mood and Affect: Mood normal.     ED Results / Procedures / Treatments   Labs (all labs ordered are listed, but only abnormal results are displayed) Labs Reviewed  COMPREHENSIVE METABOLIC PANEL - Abnormal; Notable for the following components:      Result Value   Glucose, Bld 138 (*)    Calcium 7.4 (*)    Total Protein 4.8 (*)    Albumin 1.7 (*)    AST 74 (*)    All other components within normal limits  CBC WITH DIFFERENTIAL/PLATELET  CBC WITH DIFFERENTIAL/PLATELET    EKG None  Radiology No results found.  Procedures Procedures    Medications Ordered in ED Medications  sodium chloride 0.9 % bolus 1,000 mL (has no administration in time range)    ED Course/ Medical Decision Making/ A&P Clinical Course as of 07/05/23 1021  Mon Jul 05, 2023  0919 Nursing staff is informing that patient and her husband have left the department as she did not wish to wait to receive IV fluids and only wanted labs drawn.  Stable for discharge [MP]    Clinical Course User Index [MP] Royanne Foots, DO                                 Medical Decision Making 52 year old female with history as above presenting to the ED for lab draw.  She has had issues with difficulty of IV access in the past and presents requesting an ultrasound IV for lab draw.  No other complaints at this time.  Normotensive and well-appearing.  Exam notable for dry mucous membranes.  Will obtain CBC and CMP at her request and provide IV fluids for rehydration  Amount and/or Complexity of Data  Reviewed Labs: ordered.           Final Clinical Impression(s) / ED Diagnoses Final diagnoses:  Encounter for laboratory test    Rx / DC Orders ED Discharge Orders     None         Royanne Foots, DO 07/05/23 1021

## 2023-08-26 ENCOUNTER — Ambulatory Visit: Payer: Medicare Other | Attending: Internal Medicine | Admitting: Internal Medicine

## 2023-08-26 NOTE — Progress Notes (Signed)
Erroneous encounter - please disregard.

## 2023-08-30 ENCOUNTER — Telehealth: Payer: Self-pay | Admitting: Nutrition

## 2023-08-30 ENCOUNTER — Ambulatory Visit: Payer: Medicare Other | Admitting: Nutrition

## 2023-08-30 NOTE — Telephone Encounter (Signed)
Tried to leave message to r/s missed appt but VM full on both phones.

## 2023-08-30 NOTE — Telephone Encounter (Signed)
Called and talked to husband. She is in the hospital. Advised to call when discharged if appointment is needed. He verbalized understanding.

## 2023-08-31 ENCOUNTER — Other Ambulatory Visit: Payer: Self-pay | Admitting: *Deleted

## 2023-08-31 DIAGNOSIS — L03116 Cellulitis of left lower limb: Secondary | ICD-10-CM

## 2023-09-15 ENCOUNTER — Ambulatory Visit (HOSPITAL_COMMUNITY): Payer: Medicare Other

## 2023-09-22 ENCOUNTER — Encounter (HOSPITAL_COMMUNITY): Payer: Self-pay | Admitting: Hematology

## 2023-09-25 DEATH — deceased

## 2023-11-25 ENCOUNTER — Encounter: Payer: Self-pay | Admitting: Acute Care
